# Patient Record
Sex: Male | Born: 1955 | Race: Black or African American | Hispanic: No | Smoking: Former smoker
Health system: Southern US, Community
[De-identification: ages and names within clinical notes are randomized; demographics above are authoritative.]

## PROBLEM LIST (undated history)

## (undated) DIAGNOSIS — I110 Hypertensive heart disease with heart failure: Secondary | ICD-10-CM

## (undated) DIAGNOSIS — N186 End stage renal disease: Secondary | ICD-10-CM

## (undated) DIAGNOSIS — I739 Peripheral vascular disease, unspecified: Secondary | ICD-10-CM

## (undated) DIAGNOSIS — I2510 Atherosclerotic heart disease of native coronary artery without angina pectoris: Secondary | ICD-10-CM

## (undated) DIAGNOSIS — E119 Type 2 diabetes mellitus without complications: Secondary | ICD-10-CM

## (undated) DIAGNOSIS — R0600 Dyspnea, unspecified: Secondary | ICD-10-CM

## (undated) DIAGNOSIS — N289 Disorder of kidney and ureter, unspecified: Secondary | ICD-10-CM

## (undated) DIAGNOSIS — I255 Ischemic cardiomyopathy: Secondary | ICD-10-CM

## (undated) DIAGNOSIS — I272 Pulmonary hypertension, unspecified: Secondary | ICD-10-CM

## (undated) DIAGNOSIS — I10 Essential (primary) hypertension: Secondary | ICD-10-CM

## (undated) DIAGNOSIS — I493 Ventricular premature depolarization: Secondary | ICD-10-CM

## (undated) DIAGNOSIS — I351 Nonrheumatic aortic (valve) insufficiency: Secondary | ICD-10-CM

## (undated) DIAGNOSIS — I219 Acute myocardial infarction, unspecified: Secondary | ICD-10-CM

## (undated) DIAGNOSIS — M1990 Unspecified osteoarthritis, unspecified site: Secondary | ICD-10-CM

## (undated) DIAGNOSIS — I5042 Chronic combined systolic (congestive) and diastolic (congestive) heart failure: Secondary | ICD-10-CM

## (undated) DIAGNOSIS — K759 Inflammatory liver disease, unspecified: Secondary | ICD-10-CM

## (undated) HISTORY — PX: DIALYSIS/PERMA CATHETER INSERTION: CATH118288

## (undated) HISTORY — DX: Nonrheumatic aortic (valve) insufficiency: I35.1

## (undated) HISTORY — DX: Ventricular premature depolarization: I49.3

## (undated) HISTORY — DX: Atherosclerotic heart disease of native coronary artery without angina pectoris: I25.10

## (undated) HISTORY — DX: Ischemic cardiomyopathy: I25.5

---

## 2008-08-23 ENCOUNTER — Emergency Department: Payer: Self-pay | Admitting: Emergency Medicine

## 2011-12-12 ENCOUNTER — Inpatient Hospital Stay: Payer: Self-pay | Admitting: Internal Medicine

## 2011-12-12 LAB — COMPREHENSIVE METABOLIC PANEL
Anion Gap: 26 — ABNORMAL HIGH (ref 7–16)
BUN: 192 mg/dL — ABNORMAL HIGH (ref 7–18)
Calcium, Total: <5 mg/dL — CL (ref 8.5–10.1)
Creatinine: 24.84 mg/dL — ABNORMAL HIGH (ref 0.60–1.30)
EGFR (African American): 2 — ABNORMAL LOW
EGFR (Non-African Amer.): 2 — ABNORMAL LOW
Glucose: 77 mg/dL (ref 65–99)
Osmolality: 340 (ref 275–301)
SGOT(AST): 55 U/L — ABNORMAL HIGH (ref 15–37)
Sodium: 139 mmol/L (ref 136–145)
Total Protein: 8.2 g/dL (ref 6.4–8.2)

## 2011-12-12 LAB — CBC
HCT: 19.4 % — ABNORMAL LOW (ref 40.0–52.0)
HGB: 6.6 g/dL — ABNORMAL LOW (ref 13.0–18.0)
MCH: 28.4 pg (ref 26.0–34.0)
MCHC: 33.7 g/dL (ref 32.0–36.0)
MCV: 84 fL (ref 80–100)
RBC: 2.3 10*6/uL — ABNORMAL LOW (ref 4.40–5.90)
RDW: 13.8 % (ref 11.5–14.5)

## 2011-12-12 LAB — IRON AND TIBC
Iron Saturation: 28 %
Unbound Iron-Bind.Cap.: 214 ug/dL

## 2011-12-12 LAB — PRO B NATRIURETIC PEPTIDE: B-Type Natriuretic Peptide: 26932 pg/mL — ABNORMAL HIGH (ref 0–125)

## 2011-12-12 LAB — FERRITIN: Ferritin (ARMC): 150 ng/mL (ref 8–388)

## 2011-12-12 LAB — MAGNESIUM: Magnesium: 1 mg/dL — ABNORMAL LOW

## 2011-12-12 LAB — TROPONIN I: Troponin-I: 0.07 ng/mL — ABNORMAL HIGH

## 2011-12-12 LAB — PROTIME-INR: Prothrombin Time: 15.7 secs — ABNORMAL HIGH (ref 11.5–14.7)

## 2011-12-12 LAB — URIC ACID: Uric Acid: 8 mg/dL — ABNORMAL HIGH (ref 3.5–7.2)

## 2011-12-12 LAB — CK TOTAL AND CKMB (NOT AT ARMC): CK-MB: 49.1 ng/mL — ABNORMAL HIGH (ref 0.5–3.6)

## 2011-12-12 LAB — PHOSPHORUS: Phosphorus: 13.2 mg/dL — ABNORMAL HIGH (ref 2.5–4.9)

## 2011-12-12 LAB — PROTEIN / CREATININE RATIO, URINE: Protein/Creat. Ratio: 2589 mg/gCREAT — ABNORMAL HIGH (ref 0–200)

## 2011-12-13 DIAGNOSIS — R0602 Shortness of breath: Secondary | ICD-10-CM

## 2011-12-13 LAB — BASIC METABOLIC PANEL
Anion Gap: 25 — ABNORMAL HIGH (ref 7–16)
BUN: 172 mg/dL — ABNORMAL HIGH (ref 7–18)
Chloride: 102 mmol/L (ref 98–107)
Co2: 15 mmol/L — ABNORMAL LOW (ref 21–32)
Creatinine: 23.67 mg/dL — ABNORMAL HIGH (ref 0.60–1.30)
Creatinine: 19.24 mg/dL — ABNORMAL HIGH (ref 0.60–1.30)
EGFR (African American): 2 — ABNORMAL LOW
EGFR (African American): 3 — ABNORMAL LOW
EGFR (Non-African Amer.): 2 — ABNORMAL LOW
Glucose: 99 mg/dL (ref 65–99)
Potassium: 4.3 mmol/L (ref 3.5–5.1)
Potassium: 3.2 mmol/L — ABNORMAL LOW (ref 3.5–5.1)
Sodium: 137 mmol/L (ref 136–145)
Sodium: 137 mmol/L (ref 136–145)

## 2011-12-13 LAB — HEMOGLOBIN A1C: Hemoglobin A1C: 5.3 % (ref 4.2–6.3)

## 2011-12-13 LAB — LIPID PANEL
Cholesterol: 136 mg/dL (ref 0–200)
Ldl Cholesterol, Calc: 70 mg/dL (ref 0–100)
Triglycerides: 130 mg/dL (ref 0–200)
VLDL Cholesterol, Calc: 26 mg/dL (ref 5–40)

## 2011-12-13 LAB — CBC WITH DIFFERENTIAL/PLATELET
Basophil #: 0.1 10*3/uL (ref 0.0–0.1)
HCT: 21.6 % — ABNORMAL LOW (ref 40.0–52.0)
Lymphocyte #: 0.5 10*3/uL — ABNORMAL LOW (ref 1.0–3.6)
MCH: 28.4 pg (ref 26.0–34.0)
MCV: 86 fL (ref 80–100)
Monocyte %: 10.6 %
Neutrophil #: 6.4 10*3/uL (ref 1.4–6.5)
Platelet: 250 10*3/uL (ref 150–440)
RDW: 13.8 % (ref 11.5–14.5)
WBC: 8 10*3/uL (ref 3.8–10.6)

## 2011-12-13 LAB — CK TOTAL AND CKMB (NOT AT ARMC)
CK, Total: 3525 U/L — ABNORMAL HIGH (ref 35–232)
CK-MB: 35.3 ng/mL — ABNORMAL HIGH (ref 0.5–3.6)

## 2011-12-13 LAB — MAGNESIUM: Magnesium: 2.4 mg/dL

## 2011-12-13 LAB — TROPONIN I: Troponin-I: 0.08 ng/mL — ABNORMAL HIGH

## 2011-12-14 LAB — CBC WITH DIFFERENTIAL/PLATELET
Basophil #: 0 10*3/uL (ref 0.0–0.1)
Basophil %: 0.5 %
Eosinophil #: 0.1 10*3/uL (ref 0.0–0.7)
Eosinophil %: 1.1 %
HCT: 18.8 % — ABNORMAL LOW (ref 40.0–52.0)
HGB: 6.3 g/dL — ABNORMAL LOW (ref 13.0–18.0)
Lymphocyte #: 0.6 10*3/uL — ABNORMAL LOW (ref 1.0–3.6)
MCH: 28.2 pg (ref 26.0–34.0)
MCHC: 33.4 g/dL (ref 32.0–36.0)
MCV: 84 fL (ref 80–100)
Neutrophil #: 5.6 10*3/uL (ref 1.4–6.5)
Neutrophil %: 76.8 %
RDW: 13.6 % (ref 11.5–14.5)

## 2011-12-14 LAB — RENAL FUNCTION PANEL
Anion Gap: 20 — ABNORMAL HIGH (ref 7–16)
BUN: 144 mg/dL — ABNORMAL HIGH (ref 7–18)
Calcium, Total: 5.5 mg/dL — CL (ref 8.5–10.1)
Chloride: 104 mmol/L (ref 98–107)
Creatinine: 20.09 mg/dL — ABNORMAL HIGH (ref 0.60–1.30)
EGFR (African American): 3 — ABNORMAL LOW
Glucose: 146 mg/dL — ABNORMAL HIGH (ref 65–99)
Phosphorus: >18 mg/dL — ABNORMAL HIGH (ref 2.5–4.9)
Potassium: 3.2 mmol/L — ABNORMAL LOW (ref 3.5–5.1)
Sodium: 138 mmol/L (ref 136–145)

## 2011-12-14 LAB — PROTEIN ELECTROPHORESIS(ARMC)

## 2011-12-15 LAB — RENAL FUNCTION PANEL
Albumin: 2.8 g/dL — ABNORMAL LOW (ref 3.4–5.0)
Anion Gap: 18 — ABNORMAL HIGH (ref 7–16)
BUN: 103 mg/dL — ABNORMAL HIGH (ref 7–18)
Calcium, Total: 6.1 mg/dL — CL (ref 8.5–10.1)
Chloride: 101 mmol/L (ref 98–107)
Co2: 19 mmol/L — ABNORMAL LOW (ref 21–32)
Creatinine: 16.05 mg/dL — ABNORMAL HIGH (ref 0.60–1.30)
EGFR (Non-African Amer.): 3 — ABNORMAL LOW
Phosphorus: 8.7 mg/dL — ABNORMAL HIGH (ref 2.5–4.9)

## 2011-12-16 LAB — OCCULT BLOOD X 1 CARD TO LAB, STOOL: Occult Blood, Feces: NEGATIVE

## 2011-12-17 LAB — RENAL FUNCTION PANEL
Anion Gap: 16 (ref 7–16)
BUN: 73 mg/dL — ABNORMAL HIGH (ref 7–18)
Chloride: 98 mmol/L (ref 98–107)
Co2: 23 mmol/L (ref 21–32)
Creatinine: 12.96 mg/dL — ABNORMAL HIGH (ref 0.60–1.30)
Osmolality: 297 (ref 275–301)
Phosphorus: 7.5 mg/dL — ABNORMAL HIGH (ref 2.5–4.9)
Potassium: 3.6 mmol/L (ref 3.5–5.1)
Sodium: 137 mmol/L (ref 136–145)

## 2011-12-17 LAB — CBC WITH DIFFERENTIAL/PLATELET
Basophil #: 0.1 10*3/uL (ref 0.0–0.1)
Eosinophil %: 0.7 %
HCT: 23.5 % — ABNORMAL LOW (ref 40.0–52.0)
HGB: 7.7 g/dL — ABNORMAL LOW (ref 13.0–18.0)
Lymphocyte #: 0.7 10*3/uL — ABNORMAL LOW (ref 1.0–3.6)
Lymphocyte %: 8.6 %
MCV: 86 fL (ref 80–100)
Monocyte #: 1.1 x10 3/mm — ABNORMAL HIGH (ref 0.2–1.0)
Monocyte %: 13 %
Neutrophil #: 6.2 10*3/uL (ref 1.4–6.5)
Neutrophil %: 77 %
Platelet: 258 10*3/uL (ref 150–440)
RBC: 2.74 10*6/uL — ABNORMAL LOW (ref 4.40–5.90)
RDW: 13.6 % (ref 11.5–14.5)
WBC: 8.1 10*3/uL (ref 3.8–10.6)

## 2012-01-18 ENCOUNTER — Emergency Department: Payer: Self-pay | Admitting: Emergency Medicine

## 2012-01-18 LAB — CBC WITH DIFFERENTIAL/PLATELET
Basophil %: 0.7 %
Eosinophil %: 3.5 %
HCT: 31.4 % — ABNORMAL LOW (ref 40.0–52.0)
HGB: 10 g/dL — ABNORMAL LOW (ref 13.0–18.0)
Lymphocyte %: 27.4 %
MCV: 89 fL (ref 80–100)
Monocyte #: 0.9 x10 3/mm (ref 0.2–1.0)
Monocyte %: 16.9 %
Neutrophil #: 2.8 10*3/uL (ref 1.4–6.5)
RBC: 3.52 10*6/uL — ABNORMAL LOW (ref 4.40–5.90)
WBC: 5.3 10*3/uL (ref 3.8–10.6)

## 2012-01-18 LAB — BASIC METABOLIC PANEL
Anion Gap: 10 (ref 7–16)
BUN: 49 mg/dL — ABNORMAL HIGH (ref 7–18)
Co2: 22 mmol/L (ref 21–32)
Creatinine: 8.39 mg/dL — ABNORMAL HIGH (ref 0.60–1.30)
EGFR (Non-African Amer.): 6 — ABNORMAL LOW
Glucose: 81 mg/dL (ref 65–99)
Sodium: 139 mmol/L (ref 136–145)

## 2012-01-21 ENCOUNTER — Inpatient Hospital Stay: Payer: Self-pay | Admitting: Specialist

## 2012-01-21 LAB — URINALYSIS, COMPLETE
Bacteria: NONE SEEN
Bilirubin,UR: NEGATIVE
Glucose,UR: NEGATIVE mg/dL (ref 0–75)
Leukocyte Esterase: NEGATIVE
Nitrite: NEGATIVE
Ph: 5 (ref 4.5–8.0)
Squamous Epithelial: <1
WBC UR: 2 /HPF (ref 0–5)

## 2012-01-21 LAB — TROPONIN I: Troponin-I: <0.02 ng/mL

## 2012-01-21 LAB — COMPREHENSIVE METABOLIC PANEL
Alkaline Phosphatase: 157 U/L — ABNORMAL HIGH (ref 50–136)
BUN: 32 mg/dL — ABNORMAL HIGH (ref 7–18)
Bilirubin,Total: 0.4 mg/dL (ref 0.2–1.0)
Co2: 22 mmol/L (ref 21–32)
Creatinine: 5.26 mg/dL — ABNORMAL HIGH (ref 0.60–1.30)
EGFR (Non-African Amer.): 11 — ABNORMAL LOW
Glucose: 66 mg/dL (ref 65–99)
Potassium: 3.3 mmol/L — ABNORMAL LOW (ref 3.5–5.1)
SGOT(AST): 44 U/L — ABNORMAL HIGH (ref 15–37)
SGPT (ALT): 32 U/L (ref 12–78)
Sodium: 136 mmol/L (ref 136–145)
Total Protein: 7.6 g/dL (ref 6.4–8.2)

## 2012-01-21 LAB — CBC
HGB: 10.5 g/dL — ABNORMAL LOW (ref 13.0–18.0)
MCH: 29.2 pg (ref 26.0–34.0)
MCHC: 33.1 g/dL (ref 32.0–36.0)
MCV: 88 fL (ref 80–100)
Platelet: 233 10*3/uL (ref 150–440)
RBC: 3.59 10*6/uL — ABNORMAL LOW (ref 4.40–5.90)
RDW: 16.6 % — ABNORMAL HIGH (ref 11.5–14.5)

## 2012-01-22 LAB — CBC WITH DIFFERENTIAL/PLATELET
Basophil #: 0 10*3/uL (ref 0.0–0.1)
Eosinophil #: 0 10*3/uL (ref 0.0–0.7)
Eosinophil %: 0.3 %
Lymphocyte #: 0.6 10*3/uL — ABNORMAL LOW (ref 1.0–3.6)
MCV: 89 fL (ref 80–100)
Monocyte %: 9.5 %
Neutrophil %: 85.2 %
Platelet: 211 10*3/uL (ref 150–440)
RBC: 3.15 10*6/uL — ABNORMAL LOW (ref 4.40–5.90)
RDW: 16.5 % — ABNORMAL HIGH (ref 11.5–14.5)
WBC: 11.8 10*3/uL — ABNORMAL HIGH (ref 3.8–10.6)

## 2012-01-22 LAB — BASIC METABOLIC PANEL
Anion Gap: 11 (ref 7–16)
Calcium, Total: 7.3 mg/dL — ABNORMAL LOW (ref 8.5–10.1)
Chloride: 106 mmol/L (ref 98–107)
Co2: 19 mmol/L — ABNORMAL LOW (ref 21–32)
Creatinine: 6.47 mg/dL — ABNORMAL HIGH (ref 0.60–1.30)
EGFR (Non-African Amer.): 9 — ABNORMAL LOW
Osmolality: 281 (ref 275–301)

## 2012-01-24 LAB — CBC WITH DIFFERENTIAL/PLATELET
Basophil #: 0.1 10*3/uL (ref 0.0–0.1)
Basophil %: 2 %
Eosinophil #: 0.2 10*3/uL (ref 0.0–0.7)
Eosinophil %: 2.4 %
Eosinophil %: 2.7 %
HCT: 29.8 % — ABNORMAL LOW (ref 40.0–52.0)
HGB: 9.3 g/dL — ABNORMAL LOW (ref 13.0–18.0)
Lymphocyte #: 1.9 10*3/uL (ref 1.0–3.6)
Lymphocyte #: 1.5 10*3/uL (ref 1.0–3.6)
MCH: 27.5 pg (ref 26.0–34.0)
MCHC: 31.2 g/dL — ABNORMAL LOW (ref 32.0–36.0)
MCHC: 31.1 g/dL — ABNORMAL LOW (ref 32.0–36.0)
MCV: 88 fL (ref 80–100)
Monocyte #: 1.2 x10 3/mm — ABNORMAL HIGH (ref 0.2–1.0)
Monocyte #: 0.5 x10 3/mm (ref 0.2–1.0)
Monocyte %: 12.7 %
Neutrophil #: 6 10*3/uL (ref 1.4–6.5)
Neutrophil #: 3.6 10*3/uL (ref 1.4–6.5)
Neutrophil %: 63.7 %
Neutrophil %: 60.5 %
Platelet: 268 10*3/uL (ref 150–440)
Platelet: 257 10*3/uL (ref 150–440)
RBC: 3.39 10*6/uL — ABNORMAL LOW (ref 4.40–5.90)
RBC: 3.37 10*6/uL — ABNORMAL LOW (ref 4.40–5.90)
RDW: 17.1 % — ABNORMAL HIGH (ref 11.5–14.5)
WBC: 9.4 10*3/uL (ref 3.8–10.6)
WBC: 6 10*3/uL (ref 3.8–10.6)

## 2012-01-24 LAB — CULTURE, BLOOD (SINGLE)

## 2012-01-24 LAB — BASIC METABOLIC PANEL
BUN: 56 mg/dL — ABNORMAL HIGH (ref 7–18)
Calcium, Total: 7.4 mg/dL — ABNORMAL LOW (ref 8.5–10.1)
Chloride: 110 mmol/L — ABNORMAL HIGH (ref 98–107)
EGFR (African American): 7 — ABNORMAL LOW
EGFR (Non-African Amer.): 6 — ABNORMAL LOW

## 2012-01-24 LAB — RENAL FUNCTION PANEL
Albumin: 2.7 g/dL — ABNORMAL LOW (ref 3.4–5.0)
Anion Gap: 8 (ref 7–16)
Calcium, Total: 7.9 mg/dL — ABNORMAL LOW (ref 8.5–10.1)
Glucose: 106 mg/dL — ABNORMAL HIGH (ref 65–99)
Osmolality: 292 (ref 275–301)
Phosphorus: 4.3 mg/dL (ref 2.5–4.9)

## 2012-01-26 LAB — RENAL FUNCTION PANEL
Anion Gap: 6 — ABNORMAL LOW (ref 7–16)
BUN: 40 mg/dL — ABNORMAL HIGH (ref 7–18)
Calcium, Total: 8.2 mg/dL — ABNORMAL LOW (ref 8.5–10.1)
Co2: 27 mmol/L (ref 21–32)
Creatinine: 7.94 mg/dL — ABNORMAL HIGH (ref 0.60–1.30)
EGFR (African American): 8 — ABNORMAL LOW
EGFR (Non-African Amer.): 7 — ABNORMAL LOW
Glucose: 75 mg/dL (ref 65–99)
Osmolality: 286 (ref 275–301)
Potassium: 5 mmol/L (ref 3.5–5.1)
Sodium: 139 mmol/L (ref 136–145)

## 2012-01-26 LAB — CBC WITH DIFFERENTIAL/PLATELET
Basophil #: 0.1 10*3/uL (ref 0.0–0.1)
Eosinophil %: 1.8 %
HCT: 30.7 % — ABNORMAL LOW (ref 40.0–52.0)
HGB: 9.6 g/dL — ABNORMAL LOW (ref 13.0–18.0)
Lymphocyte #: 1.8 10*3/uL (ref 1.0–3.6)
Lymphocyte %: 21.9 %
MCH: 27.5 pg (ref 26.0–34.0)
MCV: 88 fL (ref 80–100)
Monocyte %: 13 %
Neutrophil #: 5 10*3/uL (ref 1.4–6.5)
Neutrophil %: 62.2 %
Platelet: 258 10*3/uL (ref 150–440)
RBC: 3.48 10*6/uL — ABNORMAL LOW (ref 4.40–5.90)
RDW: 16.5 % — ABNORMAL HIGH (ref 11.5–14.5)
WBC: 8.1 10*3/uL (ref 3.8–10.6)

## 2012-01-27 DIAGNOSIS — I059 Rheumatic mitral valve disease, unspecified: Secondary | ICD-10-CM

## 2012-01-27 LAB — CULTURE, BLOOD (SINGLE)

## 2012-01-28 LAB — BASIC METABOLIC PANEL
Anion Gap: 8 (ref 7–16)
BUN: 37 mg/dL — ABNORMAL HIGH (ref 7–18)
Chloride: 105 mmol/L (ref 98–107)
Co2: 25 mmol/L (ref 21–32)
Creatinine: 7.97 mg/dL — ABNORMAL HIGH (ref 0.60–1.30)
EGFR (African American): 8 — ABNORMAL LOW
Sodium: 138 mmol/L (ref 136–145)

## 2012-01-29 LAB — PHOSPHORUS: Phosphorus: 8.2 mg/dL — ABNORMAL HIGH

## 2012-02-23 HISTORY — PX: AV FISTULA PLACEMENT: SHX1204

## 2012-07-06 ENCOUNTER — Ambulatory Visit: Payer: Self-pay | Admitting: Vascular Surgery

## 2012-07-19 ENCOUNTER — Emergency Department: Payer: Self-pay | Admitting: Emergency Medicine

## 2012-07-19 LAB — COMPREHENSIVE METABOLIC PANEL
Albumin: 3.3 g/dL — ABNORMAL LOW (ref 3.4–5.0)
Alkaline Phosphatase: 123 U/L (ref 50–136)
Anion Gap: 8 (ref 7–16)
BUN: 36 mg/dL — ABNORMAL HIGH (ref 7–18)
Co2: 25 mmol/L (ref 21–32)
Creatinine: 6.29 mg/dL — ABNORMAL HIGH (ref 0.60–1.30)
EGFR (Non-African Amer.): 9 — ABNORMAL LOW
Glucose: 71 mg/dL (ref 65–99)
Osmolality: 281 (ref 275–301)
SGOT(AST): 36 U/L (ref 15–37)
Total Protein: 7.6 g/dL (ref 6.4–8.2)

## 2012-07-19 LAB — TROPONIN I
Troponin-I: <0.02 ng/mL
Troponin-I: <0.02 ng/mL

## 2012-07-19 LAB — PROTIME-INR
INR: 1
Prothrombin Time: 12.9 secs (ref 11.5–14.7)

## 2012-07-19 LAB — CBC
Platelet: 217 10*3/uL (ref 150–440)
RBC: 3.74 10*6/uL — ABNORMAL LOW (ref 4.40–5.90)

## 2012-08-01 ENCOUNTER — Ambulatory Visit: Payer: Self-pay | Admitting: Vascular Surgery

## 2012-11-05 ENCOUNTER — Inpatient Hospital Stay: Payer: Self-pay | Admitting: Student

## 2012-11-05 LAB — URINALYSIS, COMPLETE
Bilirubin,UR: NEGATIVE
Ketone: NEGATIVE
Leukocyte Esterase: NEGATIVE
Nitrite: NEGATIVE
Ph: 7 (ref 4.5–8.0)
Protein: 25
RBC,UR: 1 /HPF (ref 0–5)
Specific Gravity: 1.005 (ref 1.003–1.030)
WBC UR: <1 /HPF (ref 0–5)

## 2012-11-05 LAB — COMPREHENSIVE METABOLIC PANEL
Anion Gap: 10 (ref 7–16)
BUN: 133 mg/dL — ABNORMAL HIGH (ref 7–18)
Bilirubin,Total: 0.5 mg/dL (ref 0.2–1.0)
Calcium, Total: 6.9 mg/dL — CL (ref 8.5–10.1)
Creatinine: 11.02 mg/dL — ABNORMAL HIGH (ref 0.60–1.30)
EGFR (African American): 5 — ABNORMAL LOW
SGOT(AST): 34 U/L (ref 15–37)
Sodium: 141 mmol/L (ref 136–145)

## 2012-11-05 LAB — CBC
HCT: 27.5 % — ABNORMAL LOW (ref 40.0–52.0)
HGB: 8.9 g/dL — ABNORMAL LOW (ref 13.0–18.0)
MCH: 30.1 pg (ref 26.0–34.0)
MCHC: 32.2 g/dL (ref 32.0–36.0)
Platelet: 194 10*3/uL (ref 150–440)

## 2012-11-05 LAB — POTASSIUM: Potassium: 6.6 mmol/L (ref 3.5–5.1)

## 2012-11-05 LAB — TROPONIN I: Troponin-I: <0.02 ng/mL

## 2012-11-06 DIAGNOSIS — I359 Nonrheumatic aortic valve disorder, unspecified: Secondary | ICD-10-CM

## 2012-11-06 LAB — CLOSTRIDIUM DIFFICILE BY PCR

## 2012-11-06 LAB — BASIC METABOLIC PANEL
Anion Gap: 13 (ref 7–16)
BUN: 60 mg/dL — ABNORMAL HIGH (ref 7–18)
Calcium, Total: 7.9 mg/dL — ABNORMAL LOW (ref 8.5–10.1)
Chloride: 102 mmol/L (ref 98–107)
Co2: 25 mmol/L (ref 21–32)
Glucose: 66 mg/dL (ref 65–99)
Osmolality: 294 (ref 275–301)
Potassium: 3.7 mmol/L (ref 3.5–5.1)

## 2012-11-06 LAB — TROPONIN I
Troponin-I: 0.09 ng/mL — ABNORMAL HIGH
Troponin-I: 0.14 ng/mL — ABNORMAL HIGH

## 2012-11-06 LAB — PHOSPHORUS: Phosphorus: 6.2 mg/dL — ABNORMAL HIGH (ref 2.5–4.9)

## 2012-11-06 LAB — CK TOTAL AND CKMB (NOT AT ARMC)
CK, Total: 235 U/L — ABNORMAL HIGH (ref 35–232)
CK-MB: 4.2 ng/mL — ABNORMAL HIGH (ref 0.5–3.6)
CK-MB: 3.3 ng/mL (ref 0.5–3.6)

## 2012-11-07 LAB — LIPID PANEL
Cholesterol: 101 mg/dL (ref 0–200)
Ldl Cholesterol, Calc: 38 mg/dL (ref 0–100)
Triglycerides: 119 mg/dL (ref 0–200)
VLDL Cholesterol, Calc: 24 mg/dL (ref 5–40)

## 2012-11-07 LAB — PLATELET COUNT: Platelet: 155 10*3/uL (ref 150–440)

## 2012-11-07 LAB — BASIC METABOLIC PANEL
BUN: 47 mg/dL — ABNORMAL HIGH (ref 7–18)
Calcium, Total: 7.3 mg/dL — ABNORMAL LOW (ref 8.5–10.1)
Chloride: 101 mmol/L (ref 98–107)
Co2: 30 mmol/L (ref 21–32)
EGFR (Non-African Amer.): 9 — ABNORMAL LOW
Glucose: 86 mg/dL (ref 65–99)

## 2012-11-07 LAB — WBCS, STOOL

## 2012-11-10 LAB — CBC WITH DIFFERENTIAL/PLATELET
Basophil #: 0 10*3/uL (ref 0.0–0.1)
Basophil %: 0.7 %
Eosinophil #: 0.2 10*3/uL (ref 0.0–0.7)
Eosinophil %: 2.6 %
HGB: 6.4 g/dL — ABNORMAL LOW (ref 13.0–18.0)
Lymphocyte %: 22.3 %
MCV: 93 fL (ref 80–100)
Monocyte #: 0.6 x10 3/mm (ref 0.2–1.0)
Platelet: 229 10*3/uL (ref 150–440)
RBC: 2.08 10*6/uL — ABNORMAL LOW (ref 4.40–5.90)
RDW: 14 % (ref 11.5–14.5)

## 2012-11-10 LAB — COMPREHENSIVE METABOLIC PANEL
Bilirubin,Total: 0.2 mg/dL (ref 0.2–1.0)
Calcium, Total: 8 mg/dL — ABNORMAL LOW (ref 8.5–10.1)
Co2: 26 mmol/L (ref 21–32)
Creatinine: 5.25 mg/dL — ABNORMAL HIGH (ref 0.60–1.30)
EGFR (African American): 13 — ABNORMAL LOW
Glucose: 205 mg/dL — ABNORMAL HIGH (ref 65–99)
Osmolality: 288 (ref 275–301)
Potassium: 3.7 mmol/L (ref 3.5–5.1)
Total Protein: 6.6 g/dL (ref 6.4–8.2)

## 2012-11-10 LAB — IRON AND TIBC
Iron Bind.Cap.(Total): 277 ug/dL (ref 250–450)
Iron Saturation: 25 %
Unbound Iron-Bind.Cap.: 209 ug/dL

## 2012-11-10 LAB — HEMOGLOBIN: HGB: 7 g/dL — ABNORMAL LOW (ref 13.0–18.0)

## 2012-11-11 LAB — HEMOGLOBIN: HGB: 7.1 g/dL — ABNORMAL LOW (ref 13.0–18.0)

## 2012-11-12 ENCOUNTER — Inpatient Hospital Stay: Payer: Self-pay | Admitting: Internal Medicine

## 2012-11-12 LAB — CBC WITH DIFFERENTIAL/PLATELET
Basophil %: 0.3 %
Eosinophil #: 0.1 10*3/uL (ref 0.0–0.7)
HGB: 7 g/dL — ABNORMAL LOW (ref 13.0–18.0)
Lymphocyte #: 1.3 10*3/uL (ref 1.0–3.6)
Lymphocyte %: 20.9 %
MCHC: 33.1 g/dL (ref 32.0–36.0)
MCV: 92 fL (ref 80–100)
Monocyte #: 0.8 x10 3/mm (ref 0.2–1.0)
Monocyte %: 12.2 %
Neutrophil #: 4.1 10*3/uL (ref 1.4–6.5)
Platelet: 230 10*3/uL (ref 150–440)
RBC: 2.29 10*6/uL — ABNORMAL LOW (ref 4.40–5.90)
RDW: 14.6 % — ABNORMAL HIGH (ref 11.5–14.5)
WBC: 6.4 10*3/uL (ref 3.8–10.6)

## 2012-11-13 LAB — CBC WITH DIFFERENTIAL/PLATELET
Basophil #: 0 10*3/uL (ref 0.0–0.1)
Basophil %: 0.4 %
Eosinophil %: 2.5 %
HCT: 22.2 % — ABNORMAL LOW (ref 40.0–52.0)
HGB: 7.3 g/dL — ABNORMAL LOW (ref 13.0–18.0)
Lymphocyte #: 1.4 10*3/uL (ref 1.0–3.6)
Lymphocyte %: 21.5 %
MCH: 30.6 pg (ref 26.0–34.0)
MCHC: 33.1 g/dL (ref 32.0–36.0)
MCV: 93 fL (ref 80–100)
Monocyte #: 0.8 x10 3/mm (ref 0.2–1.0)
Monocyte %: 12.1 %
Neutrophil %: 63.5 %
Platelet: 241 10*3/uL (ref 150–440)
WBC: 6.5 10*3/uL (ref 3.8–10.6)

## 2013-04-26 ENCOUNTER — Inpatient Hospital Stay: Payer: Self-pay | Admitting: Internal Medicine

## 2013-04-26 LAB — CBC
HCT: 34.4 % — AB (ref 40.0–52.0)
HGB: 11.1 g/dL — AB (ref 13.0–18.0)
MCH: 30.3 pg (ref 26.0–34.0)
MCHC: 32.3 g/dL (ref 32.0–36.0)
MCV: 94 fL (ref 80–100)
PLATELETS: 191 10*3/uL (ref 150–440)
RBC: 3.67 10*6/uL — AB (ref 4.40–5.90)
RDW: 16.4 % — ABNORMAL HIGH (ref 11.5–14.5)
WBC: 9.7 10*3/uL (ref 3.8–10.6)

## 2013-04-26 LAB — COMPREHENSIVE METABOLIC PANEL
ALT: 50 U/L (ref 12–78)
ANION GAP: 13 (ref 7–16)
Albumin: 3.2 g/dL — ABNORMAL LOW (ref 3.4–5.0)
Alkaline Phosphatase: 68 U/L
BUN: 124 mg/dL — ABNORMAL HIGH (ref 7–18)
Bilirubin,Total: 0.4 mg/dL (ref 0.2–1.0)
CHLORIDE: 104 mmol/L (ref 98–107)
Calcium, Total: 9.2 mg/dL (ref 8.5–10.1)
Co2: 20 mmol/L — ABNORMAL LOW (ref 21–32)
Creatinine: 8.74 mg/dL — ABNORMAL HIGH (ref 0.60–1.30)
EGFR (African American): 7 — ABNORMAL LOW
GFR CALC NON AF AMER: 6 — AB
Glucose: 80 mg/dL (ref 65–99)
Osmolality: 313 (ref 275–301)
Potassium: 5.7 mmol/L — ABNORMAL HIGH (ref 3.5–5.1)
SGOT(AST): 56 U/L — ABNORMAL HIGH (ref 15–37)
Sodium: 137 mmol/L (ref 136–145)
Total Protein: 7.2 g/dL (ref 6.4–8.2)

## 2013-04-26 LAB — POTASSIUM: Potassium: 4.1 mmol/L (ref 3.5–5.1)

## 2013-04-26 LAB — PHOSPHORUS: Phosphorus: 5.2 mg/dL — ABNORMAL HIGH (ref 2.5–4.9)

## 2013-04-26 LAB — HEMOGLOBIN: HGB: 8.5 g/dL — AB (ref 13.0–18.0)

## 2013-04-26 LAB — CK TOTAL AND CKMB (NOT AT ARMC)
CK, Total: 171 U/L
CK-MB: 3 ng/mL (ref 0.5–3.6)

## 2013-04-26 LAB — PROTIME-INR
INR: 1.1
Prothrombin Time: 14 secs (ref 11.5–14.7)

## 2013-04-26 LAB — SALICYLATE LEVEL: Salicylates, Serum: <1.7 mg/dL

## 2013-04-26 LAB — ETHANOL
Ethanol %: <0.003 % (ref 0.000–0.080)
Ethanol: <3 mg/dL

## 2013-04-27 ENCOUNTER — Ambulatory Visit: Payer: Self-pay | Admitting: Neurology

## 2013-04-27 LAB — DRUG SCREEN, URINE
Amphetamines, Ur Screen: NEGATIVE (ref ?–1000)
BARBITURATES, UR SCREEN: NEGATIVE (ref ?–200)
BENZODIAZEPINE, UR SCRN: NEGATIVE (ref ?–200)
COCAINE METABOLITE, UR ~~LOC~~: POSITIVE (ref ?–300)
Cannabinoid 50 Ng, Ur ~~LOC~~: NEGATIVE (ref ?–50)
MDMA (Ecstasy)Ur Screen: NEGATIVE (ref ?–500)
Methadone, Ur Screen: NEGATIVE (ref ?–300)
OPIATE, UR SCREEN: NEGATIVE (ref ?–300)
Phencyclidine (PCP) Ur S: NEGATIVE (ref ?–25)
TRICYCLIC, UR SCREEN: NEGATIVE (ref ?–1000)

## 2013-04-27 LAB — CBC WITH DIFFERENTIAL/PLATELET
Basophil #: 0 10*3/uL (ref 0.0–0.1)
Basophil #: 0.1 10*3/uL (ref 0.0–0.1)
Basophil %: 0.5 %
Basophil %: 1 %
EOS ABS: 0.1 10*3/uL (ref 0.0–0.7)
Eosinophil #: 0.1 10*3/uL (ref 0.0–0.7)
Eosinophil %: 0.8 %
Eosinophil %: 1.3 %
HCT: 27.6 % — AB (ref 40.0–52.0)
HCT: 26.1 % — ABNORMAL LOW (ref 40.0–52.0)
HGB: 9.5 g/dL — AB (ref 13.0–18.0)
HGB: 9 g/dL — AB (ref 13.0–18.0)
Lymphocyte #: 1.8 10*3/uL (ref 1.0–3.6)
Lymphocyte #: 2.2 10*3/uL (ref 1.0–3.6)
Lymphocyte %: 21.3 %
Lymphocyte %: 20.8 %
MCH: 31.7 pg (ref 26.0–34.0)
MCH: 31.5 pg (ref 26.0–34.0)
MCHC: 34.4 g/dL (ref 32.0–36.0)
MCHC: 34.3 g/dL (ref 32.0–36.0)
MCV: 92 fL (ref 80–100)
MCV: 92 fL (ref 80–100)
MONO ABS: 0.9 x10 3/mm (ref 0.2–1.0)
Monocyte #: 0.7 x10 3/mm (ref 0.2–1.0)
Monocyte %: 8.6 %
Monocyte %: 8.3 %
NEUTROS ABS: 6 10*3/uL (ref 1.4–6.5)
NEUTROS PCT: 68.6 %
Neutrophil #: 7.1 10*3/uL — ABNORMAL HIGH (ref 1.4–6.5)
Neutrophil %: 68.8 %
PLATELETS: 170 10*3/uL (ref 150–440)
Platelet: 164 10*3/uL (ref 150–440)
RBC: 2.99 10*6/uL — AB (ref 4.40–5.90)
RBC: 2.85 10*6/uL — AB (ref 4.40–5.90)
RDW: 15.6 % — AB (ref 11.5–14.5)
RDW: 15.8 % — AB (ref 11.5–14.5)
WBC: 8.7 10*3/uL (ref 3.8–10.6)
WBC: 10.4 10*3/uL (ref 3.8–10.6)

## 2013-04-27 LAB — TSH: THYROID STIMULATING HORM: 0.622 u[IU]/mL

## 2013-04-27 LAB — BASIC METABOLIC PANEL
ANION GAP: 11 (ref 7–16)
BUN: 116 mg/dL — ABNORMAL HIGH (ref 7–18)
CALCIUM: 8.1 mg/dL — AB (ref 8.5–10.1)
CHLORIDE: 106 mmol/L (ref 98–107)
Co2: 23 mmol/L (ref 21–32)
Creatinine: 8.64 mg/dL — ABNORMAL HIGH (ref 0.60–1.30)
EGFR (African American): 7 — ABNORMAL LOW
EGFR (Non-African Amer.): 6 — ABNORMAL LOW
Glucose: 80 mg/dL (ref 65–99)
Osmolality: 315 (ref 275–301)
Potassium: 4.7 mmol/L (ref 3.5–5.1)
Sodium: 140 mmol/L (ref 136–145)

## 2013-04-28 LAB — RENAL FUNCTION PANEL
Albumin: 2.7 g/dL — ABNORMAL LOW (ref 3.4–5.0)
Anion Gap: 10 (ref 7–16)
BUN: 133 mg/dL — ABNORMAL HIGH (ref 7–18)
CHLORIDE: 108 mmol/L — AB (ref 98–107)
CREATININE: 11.2 mg/dL — AB (ref 0.60–1.30)
Calcium, Total: 7.7 mg/dL — ABNORMAL LOW (ref 8.5–10.1)
Co2: 20 mmol/L — ABNORMAL LOW (ref 21–32)
EGFR (Non-African Amer.): 4 — ABNORMAL LOW
GFR CALC AF AMER: 5 — AB
Glucose: 122 mg/dL — ABNORMAL HIGH (ref 65–99)
Osmolality: 320 (ref 275–301)
PHOSPHORUS: 5.1 mg/dL — AB (ref 2.5–4.9)
Potassium: 4.8 mmol/L (ref 3.5–5.1)
SODIUM: 138 mmol/L (ref 136–145)

## 2013-04-28 LAB — CBC WITH DIFFERENTIAL/PLATELET
BASOS ABS: 0 10*3/uL (ref 0.0–0.1)
Basophil %: 0.5 %
Eosinophil #: 0.2 10*3/uL (ref 0.0–0.7)
Eosinophil %: 2.8 %
HCT: 22.4 % — AB (ref 40.0–52.0)
HGB: 7.5 g/dL — ABNORMAL LOW (ref 13.0–18.0)
Lymphocyte #: 1.1 10*3/uL (ref 1.0–3.6)
Lymphocyte %: 16 %
MCH: 31.5 pg (ref 26.0–34.0)
MCHC: 33.5 g/dL (ref 32.0–36.0)
MCV: 94 fL (ref 80–100)
Monocyte #: 0.6 x10 3/mm (ref 0.2–1.0)
Monocyte %: 9 %
Neutrophil #: 4.7 10*3/uL (ref 1.4–6.5)
Neutrophil %: 71.7 %
Platelet: 143 10*3/uL — ABNORMAL LOW (ref 150–440)
RBC: 2.39 10*6/uL — ABNORMAL LOW (ref 4.40–5.90)
RDW: 15.8 % — AB (ref 11.5–14.5)
WBC: 6.5 10*3/uL (ref 3.8–10.6)

## 2013-04-28 LAB — HEMOGLOBIN
HGB: 7.9 g/dL — ABNORMAL LOW (ref 13.0–18.0)
HGB: 7.9 g/dL — ABNORMAL LOW (ref 13.0–18.0)

## 2013-04-29 LAB — HEMOGLOBIN: HGB: 7.4 g/dL — ABNORMAL LOW (ref 13.0–18.0)

## 2013-04-30 LAB — RENAL FUNCTION PANEL
ALBUMIN: 2.6 g/dL — AB (ref 3.4–5.0)
Anion Gap: 7 (ref 7–16)
BUN: 76 mg/dL — AB (ref 7–18)
CALCIUM: 8 mg/dL — AB (ref 8.5–10.1)
CO2: 25 mmol/L (ref 21–32)
Chloride: 107 mmol/L (ref 98–107)
Creatinine: 9.49 mg/dL — ABNORMAL HIGH (ref 0.60–1.30)
EGFR (African American): 6 — ABNORMAL LOW
EGFR (Non-African Amer.): 5 — ABNORMAL LOW
GLUCOSE: 104 mg/dL — AB (ref 65–99)
OSMOLALITY: 300 (ref 275–301)
PHOSPHORUS: 4.6 mg/dL (ref 2.5–4.9)
Potassium: 4.4 mmol/L (ref 3.5–5.1)
Sodium: 139 mmol/L (ref 136–145)

## 2013-04-30 LAB — CLOSTRIDIUM DIFFICILE(ARMC)

## 2013-04-30 LAB — HEMOGLOBIN: HGB: 7.2 g/dL — ABNORMAL LOW (ref 13.0–18.0)

## 2013-05-03 LAB — STOOL CULTURE

## 2013-09-04 ENCOUNTER — Emergency Department: Payer: Self-pay | Admitting: Emergency Medicine

## 2013-09-04 LAB — CBC
HCT: 31.2 % — AB (ref 40.0–52.0)
HGB: 9.8 g/dL — AB (ref 13.0–18.0)
MCH: 28.5 pg (ref 26.0–34.0)
MCHC: 31.5 g/dL — AB (ref 32.0–36.0)
MCV: 91 fL (ref 80–100)
Platelet: 203 10*3/uL (ref 150–440)
RBC: 3.44 10*6/uL — ABNORMAL LOW (ref 4.40–5.90)
RDW: 15.3 % — AB (ref 11.5–14.5)
WBC: 11.5 10*3/uL — AB (ref 3.8–10.6)

## 2014-06-11 NOTE — Consult Note (Signed)
PATIENT NAME:  [name], [name] MR#:  [id number] DATE OF BIRTH:  [birth year]  DATE OF CONSULTATION:  12/13/2011  REFERRING PHYSICIAN:   CONSULTING PHYSICIAN:  Algernon Huxley, MD  REASON FOR CONSULTATION: Dialysis catheter placement for renal failure.   HISTORY OF PRESENT ILLNESS: This is a 59 year old African American male who was admitted with severe renal failure. His BUN was greater than 170 and his creatinine was greater than 23. He is confused and just generally feeling poorly with no energy. No baseline renal function was known. He was very short of breath and dizzy. Apparently he has had diabetes for many years and taking Glucophage when he was in prison but could no longer take any medications after he got out of prison. He has a heavy tobacco and alcohol history, although apparently he quit alcohol cold [place] a few weeks ago. He needs a dialysis catheter at this time for urgent immediate dialysis.   PAST MEDICAL HISTORY: Diabetes, which he has not taking medicine for for over a decade. No other known problems, but he does not go to a doctor.  PAST SURGICAL HISTORY: None.  MEDICATIONS: He has been taking none.   ALLERGIES: Sulfa.   FAMILY HISTORY: Mother has diabetes. Sister has diabetes.   SOCIAL HISTORY: He smokes and has for about 30 years. Heavy alcohol use but apparently reported quitting cold [place] at the beginning of the month. He lives with his nephew. He is a widower.   REVIEW OF SYSTEMS: Review of systems is a bit fuzzy as he is a very poor historian. GENERAL: No fevers or chills. Positive for fatigue and weakness. EYES: No blurred or double vision. EARS: No tinnitus or ear pain. CARDIOVASCULAR: No chest pain or palpitations. RESPIRATORY: Positive for shortness of breath and wheezing. GASTROINTESTINAL: Positive for nausea and poor appetite. GENITOURINARY: No hematuria or dysuria. ENDOCRINE: No heat or cold intolerance. HEME: No anemia or easy bruising that he knew of, but he is  anemic on arrival. SKIN: Positive for vitiligo. MUSCULOSKELETAL: No joint pain or swelling. NEUROLOGIC: No transient ischemic attack, stroke or seizure. PSYCH: No anxiety or depression.   PHYSICAL EXAMINATION:   GENERAL: A well-developed, well-nourished [place] American male not in acute distress.   VITAL SIGNS: Temperature 97.5, pulse 65, and blood pressure 160/86.   HEAD/FACE: Normocephalic, atraumatic.   EYES: Sclerae anicteric. Conjunctivae are clear.   EARS: Normal external appearance. Hearing is intact.   NECK: Supple without adenopathy or jugular venous distention.   HEART: Regular rate and rhythm.   LUNGS: Diminished bilaterally.   ABDOMEN: Soft, nondistended, and nontender.   EXTREMITIES: Mild lower extremity edema.   SKIN: Changes of vitiligo, otherwise warm and dry.  NEURO: Poor historian. No focal deficits appreciated.   LABS: Sodium is 137, potassium 4.3, chloride 109, CO2 9, BUN IS 172 today which is down for 192 on admission yesterday, and his creatinine is 23.67 which is down from 24.8 yesterday on admission with some hydration. White blood cell count is 8, hemoglobin 7.2, and platelet count 250,000.       ASSESSMENT AND PLAN: This is a 59 year old African American male who needs a dialysis catheter. This will be placed at the bedside.  This is a level-3 consultation. ____________________________ Algernon Huxley, MD jsd:slb D: 12/13/2011 10:03:28 ET T: 12/13/2011 10:31:38 ET JOB#: [id number]  cc: Algernon Huxley, MD, <Dictator> Algernon Huxley MD ELECTRONICALLY SIGNED 12/13/2011 13:10

## 2014-06-11 NOTE — Op Note (Signed)
PATIENT NAME:  [name], [name] MR#:  [id number] DATE OF BIRTH:  Aug 03, 1955  DATE OF PROCEDURE:  12/16/2011  PREOPERATIVE DIAGNOSES:  1. End-stage renal disease.  2. Hypertension.  3. Diabetes.   POSTOPERATIVE DIAGNOSES:  1. End-stage renal disease.  2. Hypertension.  3. Diabetes.   PROCEDURES:  1. Ultrasound guidance for vascular access, right jugular vein.  2. Fluoroscopic guidance for placement of catheter.  3. Placement of a right internal jugular vein PermCath.   SURGEON: Algernon Huxley, MD  ANESTHESIA: Local with moderate conscious sedation.   ESTIMATED BLOOD LOSS: Approximately 25 mL.   FLUOROSCOPY TIME: Less than one minute.   CONTRAST USED: None.   INDICATION FOR PROCEDURE: 59 year old African American male who was admitted earlier this week with renal failure. He has had three dialysis treatments and yet his BUN is in excess of 100 and his creatinine is still over 15. He has been deemed end-stage renal disease and will require long-term dialysis.   DESCRIPTION OF PROCEDURE: Patient brought to the vascular interventional radiology suite. Right neck and chest sterilely prepped and draped, a sterile surgical field was created. The right jugular vein was visualized with ultrasound and found to be patent. It was then accessed with minimal difficulty with a Seldinger needle under direct ultrasound guidance and permanent image was recorded. J-wire was placed. After skin nick and dilatation, the peel-away sheath was placed over the wire. I then anesthetized an area below the right clavicle, tunneled from the subclavicular incision to the access site and I selected a 19 cm tip to cuff tunneled hemodialysis catheter using fluoroscopic guidance. This was then placed through the peel-away sheath. The peel-away sheath was removed. The catheter tips were located in the right atrium and then the appropriate distal connectors were placed. It withdrew blood well and flushed easily with heparinized  saline and the concentrated heparin solution was then placed. It was secured to the chest wall with two Prolene sutures. A 4-0 Monocryl pursestring suture was placed at the exit site and 4-0 Monocryl suture was used to close the access site. Sterile dressing was placed. The patient tolerated procedure well was taken to recovery room in stable condition.   ____________________________ Algernon Huxley, MD jsd:cms D: 12/16/2011 16:23:21 ET T: 12/16/2011 16:39:45 ET JOB#: [id number]  cc: Algernon Huxley, MD, <Dictator>  Algernon Huxley MD ELECTRONICALLY SIGNED 12/19/2011 9:16

## 2014-06-11 NOTE — Op Note (Signed)
PATIENT NAME:  [name], [name] MR#:  [id number] DATE OF BIRTH:  24-Feb-1955  DATE OF PROCEDURE:  12/13/2011  PREOPERATIVE DIAGNOSES:  1. Renal failure with unknown baseline renal functioning to whether this is acute renal failure or end-stage renal disease.  2. Uremia, severe.   POSTOPERATIVE DIAGNOSES: 1. Renal failure with unknown baseline renal functioning to whether this is acute renal failure or end-stage renal disease.  2. Uremia, severe.   PROCEDURES:  1. Ultrasound guidance for vascular access, right femoral vein.  2. Placement of right femoral Trialysis-type dialysis catheter, 30 cm in length.   SURGEON: Algernon Huxley, MD   ANESTHESIA: Local with lidocaine.   ESTIMATED BLOOD LOSS: Minimal.   INDICATION FOR PROCEDURE: This is a 59 year old African American male who was admitted with a BUN of greater than 170 and a creatinine of greater than 24. He had no baseline renal function known and needs dialysis urgently for his renal failure, severe uremia. His BNP is massively elevated consistent with volume overload from renal failure.   DESCRIPTION OF PROCEDURE: The patient was laid flat in his floor bed. His right groin were sterilely prepped and draped and a sterile surgical field was created. Femoral access was selected to save the jugulars for PermCath which would likely be necessary in the near future. The right femoral vein was visualized with ultrasound and found to be patent. It was accessed under direct ultrasound guidance without difficulty with a Seldinger needle. A J-wire was placed. After skin nick and dilatation, the 30 cm long Trialysis-type dialysis catheter was placed over the wire and the wire was removed.   All three lumens withdrew dark red nonpulsatile blood and flushed easily with sterile saline. It was secured at 29 cm with three nylon sutures. Sterile dressing was placed. The patient tolerated the procedure well.   ____________________________ Algernon Huxley,  MD jsd:drc D: 12/13/2011 10:04:39 ET T: 12/13/2011 10:26:11 ET JOB#: [id number]  cc: Algernon Huxley, MD, <Dictator> Algernon Huxley MD ELECTRONICALLY SIGNED 12/13/2011 13:10

## 2014-06-11 NOTE — Op Note (Signed)
PATIENT NAME:  DANIELJOSEPH, ULVEN MR#:  [id number] DATE OF BIRTH:  [birth year]  DATE OF PROCEDURE:  01/22/2012  PREOPERATIVE DIAGNOSES:  1. Bacteremia.  2. End-stage renal disease with PermCath present.    POSTOPERATIVE DIAGNOSES: 1. Bacteremia.  2. End-stage renal disease with PermCath present.    PROCEDURE: Removal of right jugular PermCath.   SURGEON: Algernon Huxley, MD   ANESTHESIA: Local.   ESTIMATED BLOOD LOSS: Minimal.   INDICATION FOR PROCEDURE: This is a 59 year old African American male with end-stage renal disease. He has gram-negative rod bacteremia and a PermCath in place. This will need to be removed.   DESCRIPTION OF PROCEDURE: The patient's existing catheter and right chest was sterilely prepped and draped and a sterile surgical field was created. The area was anesthetized copiously with 1% Xylocaine and then hemostats were used to dissect out and free the cuff from the fibrous sheath. The catheter was then removed in its entirety without difficulty with gentle traction. Pressure was held. Sterile dressing was placed. The catheter tip was cut and put in a specimen container for culture. The patient tolerated the procedure well.   ____________________________ Algernon Huxley, MD jsd:drc D: 01/22/2012 11:26:11 ET T: 01/22/2012 11:39:23 ET JOB#: [id number]  cc: Algernon Huxley, MD, <Dictator> Algernon Huxley MD ELECTRONICALLY SIGNED 01/24/2012 9:28

## 2014-06-11 NOTE — Consult Note (Signed)
Patient admitted with renal failure.  No known baseline renal function.  His BUN is 172 and his Cr is over 20.  Needs urgent HD.  Temp cath placed with his severe uremia.  If he is agreeable, will place Permcath later this week.  Electronic Signatures: Algernon Huxley (MD)  (Signed on 21-Oct-13 09:55)  Authored  Last Updated: 21-Oct-13 09:55 by Algernon Huxley (MD)

## 2014-06-11 NOTE — Consult Note (Signed)
PATIENT NAME:  [name], [name] MR#:  [id number] DATE OF BIRTH:  [birth year]  DATE OF CONSULTATION:  12/13/2011  REFERRING PHYSICIAN:  Dr. Karsten [name]  CONSULTING PHYSICIAN: Loistine Simas, MD/Tryniti Laatsch Orrin Brigham, NP  HISTORY OF PRESENT ILLNESS: [name] is a very pleasant 59 year old African American man with a history of alcohol abuse. GI has been consulted at the request of Dr. Karsten [name] for evaluation of anemia. Appreciate renal consult. Please see history and physical for details of admission. The patient was admitted with a hemoglobin of 6.6, creatinine greater than 24, and BUN greater than 100. Noted normal iron studies and normocytic red cells with normal RDW, normal platelet count. Noted elevated uric acid, CPK-MB, troponin levels. The patient reports onset of fatigue, shortness of breath with exertion, nausea and vomiting last week. He states that he vomits food stuffs randomly. No history of prior luminal evaluation. Denies abdominal pain, constipation, diarrhea, change to bowel habits, problems swallowing, acid reflux, and all other GI complaints. Noted that he has stool testing for occult blood, Hepatitis B and C tests, echocardiogram pending. Has been started on a PPI. Does also have some irregular loss of pigmentation to his hands and neck, onset 15 years ago. Has not been seen by dermatologist as of yet.   ALLERGIES: Sulfa medications.  MEDICATIONS: States no medications regularly.   PAST MEDICAL HISTORY: Questionable for diabetes. Denies other chronic or other type of illnesses and any surgery.   FAMILY HISTORY: Mom and sister with diabetes. No known other medical problems.   SOCIAL HISTORY: Does not smoke cigarettes, endorses a pack over three days for greater than 30 years. Has admitted consuming approximately a 12 pack a day of beer with intermittent moonshine use, however, states he has quit cold [place] over the last three weeks. Denies illicits. Widower and lives with nephew.  REVIEW  OF SYSTEMS: 10 systems reviewed, unremarkable other than what is noted above.   MOST RECENT LABS: Glucose 99. BNP [id number]. BUN 172, creatinine 23.67, potassium 4.3, GFR 2, calcium 5.1, phosphorus 13.4, magnesium 2.4, elevated uric acid, elevated CPK-MB and troponin x3. A1c 5.3. Total serum protein 8.2, albumin 3.4, bilirubin 0.4, alkaline phosphatase 99, AST 55, ALT 37. Iron 82, TIBC 296, UIBC 214, iron saturation 28, ferritin 150. WBC 8, hemoglobin 7.2, hematocrit 21.6, platelet count 250. Red cells are normocytic with normal distribution. PT 15.7. INR 1.2   PHYSICAL EXAMINATION:   MOST RECENT VITAL SIGNS: Temperature 97.5, pulse 58, respiratory rate 18, blood pressure 120/69, SAO2 99%. The patient is currently receiving hemodialysis.   GENERAL: Chronically ill but well appearing African American man lying in bed in no acute distress.   HEENT: Normocephalic, atraumatic. No redness, drainage, or inflammation to the eyes or nares. Oral mucous membranes are pink and moist.   NECK: No adenopathy, thyromegaly, or JVD.   RESPIRATORY: Respirations eupneic. Lungs CTAB.   CARDIAC: S1, S2. RRR. No MRG. Peripheral pulses 2+. No appreciable edema.   ABDOMEN: Flat, nondistended. Bowel sounds x4. Nontender. No guarding, rigidity, rebound, tenderness, peritoneal signs, hepatosplenomegaly, or hernias.   RECTAL: Deferred.   GU: Deferred.   EXTREMITIES: Generalized weakness. Does move all extremities spontaneously. Strength is approximately 4/5. Sensation appears to be intact.   SKIN: Warm, dry, pink. Does have pigment loss to both hands and some to his neck.   NEUROLOGIC: Alert, oriented x3. Speech clear. No facial droop. Cranial nerves II through XII intact.   PSYCH: Pleasant, cooperative. Responds to questions well.   IMPRESSION AND PLAN: Anemia,  likely multifactorial with large relation to his kidney disease. No signs of GI bleeding at present and has normal iron studies, folic acid normal, [id number]  pending. Agree with stool cards and PPI therapy. His nausea and vomiting also may be related to his uremia. Will treat symptomatically and may improve as his system is filtered with dialysis which he is currently receiving. Additionally, may benefit from epo level. Noted ANA and other autoimmune immune tests ordered. The patient additionally with extremely elevated BNP and elevation to cardiac enzymes; may benefit from cardiac evaluation. Will plan for luminal evaluation as his renal and cardiac status improve as he has had no evaluation of this type in the past and when he is feeling better this can be done as outpatient unless the patient shows signs of bleeding. Additionally, elevation in AST may be related to his alcohol consumption. Would follow for now. Consider abdominal ultrasound if it does not improve or increases.   We will follow with you. Thank you for this consult.   These services were provided by Stephens November, MSN, [place] in collaboration with Loistine Simas, MD with whom I have discussed this patient in full.   ____________________________ Theodore Demark, NP chl:drc D: 12/13/2011 16:28:23 ET T: 12/13/2011 16:45:09 ET JOB#: [id number]  cc: Theodore Demark, NP, <Dictator> [place] SIGNED 12/29/2011 8:19

## 2014-06-11 NOTE — Consult Note (Signed)
PATIENT NAME:  [name], [name] MR#:  [id number] DATE OF BIRTH:  [birth year]  DATE OF CONSULTATION:  01/24/2012  REFERRING PHYSICIAN:  Vaughan Basta, MD CONSULTING PHYSICIAN:  Heinz Knuckles. Remmie Bembenek, MD  REASON FOR CONSULTATION: Gram-negative rod bacteremia.   HISTORY OF PRESENT ILLNESS: The patient is a 59 year old man with a past history significant for hepatitis C infection and end-stage renal disease and on hemodialysis who was admitted on 01/21/2012 with approximately one week of fevers and chills at dialysis. The patient states that he had been feeling well until he began having fevers and chills a week prior to admission. This occurred approximately two hours into dialysis. He states that at one point he had showered and had thought he had covered his dialysis catheter well, but it still got wet. He did not have any redness or drainage from the catheter, although he did feel that one of the sutures was causing him discomfort. He felt well in between dialysis sessions last week. He had blood cultures obtained at the dialysis center on 01/15/2012 which reportedly are growing a gram-negative rod. He was seen in the emergency room on 01/18/2012 and blood cultures were negative. He was given IV antibiotics in the emergency room and given an oral antibiotic to take at home, which he did not fill. Three days later he was admitted to the hospital. Blood cultures from admission to the hospital are negative. His catheter was removed on 01/22/2012 and the catheter tip is growing a coagulase-negative staph. He was initially treated with Zosyn and vancomycin. The vancomycin has been stopped. The culture from the dialysis center is not currently available for review. He is feeling significantly better. He had a temporary catheter placed today and had dialysis and tolerated it well.  ALLERGIES: Sulfa drugs.   PAST MEDICAL HISTORY:  1. Hepatitis C infection, diagnosed in October 2013 when he started dialysis.   2. End-stage renal disease, on hemodialysis.  3. Hypertension.   SOCIAL HISTORY: The patient lives with friends. He is a prior smoker and drinker, but quit approximately four months ago. He denies any injecting drug use history. He has no pets at home.   FAMILY HISTORY: Positive for diabetes and cancer.   REVIEW OF SYSTEMS: GENERAL: Positive fevers, chills, and malaise. HEENT: No headaches. No sinus congestion. No sore throat. NECK: No stiffness. No swollen glands. RESPIRATORY: No cough. No sputum production. No wheezing. He has had some dyspnea on exertion. CARDIAC: No chest pain. No peripheral edema. GI: No nausea, no vomiting, no abdominal pain, and no change in his bowels. GENITOURINARY: No change in his urine. He still makes urine regularly. MUSCULOSKELETAL: No joint or muscle complaints. SKIN: No rashes. NEUROLOGIC: No focal weakness. PSYCHIATRIC: No complaints. All other systems are negative.   PHYSICAL EXAMINATION:   VITAL SIGNS: T-max 99.5, T-current 98.1, pulse 54, blood pressure 168/84, and 99% on room air.   GENERAL: A 59 year old black man in no acute distress.   HEENT: Normocephalic, atraumatic. Pupils equal and reactive to light. Extraocular motion intact. Sclerae, conjunctivae, and lids without evidence for emboli or petechiae. Oropharynx shows no erythema or exudate. Teeth and gums are in fair condition.   NECK: Supple. Full range of motion. Midline trachea. No lymphadenopathy. No thyromegaly.   LUNGS: Clear to auscultation bilaterally with good air movement. No focal consolidation.   HEART: Regular rate and rhythm without murmur, rub, or gallop.   ABDOMEN: Soft, nontender, and nondistended. No hepatosplenomegaly. No hernia is noted.   EXTREMITIES: No evidence for  tenosynovitis.   SKIN: No rashes. He has a bandage over the right chest, at his prior hemodialysis catheter site. He also had a temporary catheter in the right groin. Neither of these areas are particularly  tender. There are no other rashes noted. No stigmata of endocarditis, specifically no Janeway lesions or Osler nodes.   NEUROLOGIC: The patient is awake and interactive, moving all four extremities.   PSYCHIATRIC: Mood and affect appeared normal.   LABS/RADIOLOGIC STUDIES: BUN is 41, creatinine 7.50, bicarbonate 23, and anion gap 8. White count 6.0, hemoglobin 9.3, platelet count 257, and ANC 3.6. White count on admission was 4.0 and rose as high as 11.8. White count in the emergency room, on 01/18/2012 was 5.3.   Blood cultures from 01/18/2012 are negative. Blood cultures from 01/21/2012 are negative.   Urinalysis from admission was unremarkable.   Urine culture from admission showed no growth.   Catheter tip culture is growing coagulase-negative staph.   Blood cultures from 01/23/2012 show no growth.   Blood cultures reported from 01/15/2012, in hemodialysis, are growing a gram-negative rod, but are not available for review.   A chest x-ray showed no acute infiltrates.   IMPRESSION: This is a 59 year old male with a history of end-stage renal disease on hemodialysis and hepatitis C infection who is admitted with fevers and chills at hemodialysis.   RECOMMENDATIONS:  1. He had a week of fevers and chills. His hemodialysis catheter has been removed. Blood cultures are negative, from the ER and on admission. He was given IV therapy in the ER, but did not get the oral antibiotics filled. His catheter tip grew coagulase-negative staph. This could be a contaminate. There is a report of a gram-negative rod from hemodialysis approximately a week before admission. Sensitivities and identification are not available for review at this time.  2. He is feeling better since his hemodialysis catheter was removed and he has been on antibiotics.  3. We will continue Zosyn. We will await the results of his culture.  4. Vancomycin has been stopped.  5. As his blood cultures are currently negative, he can  get a PermCath placed when possible.      This is a moderately complex infectious disease case. Thank you very much for involving me in Mr. Wolfinger care. ____________________________ Heinz Knuckles. Susette Seminara, MD meb:slb D: 01/24/2012 16:52:21 ET T: 01/25/2012 10:12:06 ET JOB#: [id number]  cc: Heinz Knuckles. Satvik Parco, MD, <Dictator> Mckade Gurka E Goebel Hellums MD ELECTRONICALLY SIGNED 01/26/2012 14:11

## 2014-06-11 NOTE — H&P (Signed)
PATIENT NAME:  [name], TOWERS MR#:  [id number] DATE OF BIRTH:  1955/08/26  DATE OF ADMISSION:  12/12/2011  ER REFERRING PHYSICIAN: Lenise Arena, MD  PRIMARY CARE PHYSICIAN: None.   CHIEF COMPLAINT: Weakness, shortness of breath, dizziness.   HISTORY OF PRESENT ILLNESS: The patient is a 59 year old male who reports that he was diagnosed with diabetes more than 17 years ago while he was in prison and was taking Glucophage once he was released from prison, he discontinued taking all his medications, and as far as he knows he has no other medical problems. He does smoke cigarettes and has been a heavy alcohol abuser of about a 12-pack per day for the last 30 years. He reports that he quit cold [place] about three weeks ago. He denies any drug abuse. For the past 1 to 1-1/2 weeks he has been feeling weak, short of breath, dizzy, and having nausea and vomiting every single day. Therefore, he decided to come to the Emergency Room to get evaluated. He denies any hematemesis, melena, bright red bleeding per rectum or any blood in stool. He denies any belly pain or chest pain. No baseline labs were available for the patient, but  he appears to be in renal failure with a creatinine of 24.84. He possibly also has undiagnosed hypertension.   ALLERGIES: Sulfa.   MEDICATIONS: None.   PAST MEDICAL HISTORY:  1. History of diabetes for which he was taking Glucophage at one point but has not done so for more than 10 years.  2. Denies any history of hypertension, hyperlipidemia, anemia or  kidney problems.    PAST SURGICAL HISTORY: None.   FAMILY HISTORY: Mother had diabetes. Sister has diabetes. He is not aware if his father had any  medical problems.   SOCIAL HISTORY: He smokes 1 pack over 3 days for more than 30 years. He used to drink daily beer, about a 12-pack a day, can size 12 ounces. He reports that he quit cold [place] about three weeks ago. He denies any drug abuse. He is widower and lives with his  nephew.   REVIEW OF SYSTEMS: CONSTITUTIONAL: Denies any fever. Reports fatigue, weakness. EYES: Denies any blurred or double vision. ENT: Denies any tinnitus, ear pain. RESPIRATORY: Denies any cough, wheezing. CARDIOVASCULAR: Denies any palpitations, syncope. GI: Reports nausea, vomiting.  Denies any diarrhea or constipation. GENITOURINARY: Denies any oliguria, dysuria, hematuria. ENDOCRINE: Denies any polyuria or nocturia. HEME/LYMPH: Denies any anemia or easy bruisability. INTEGUMENTARY: Has vitiligo. Denies any acne or rash. MUSCULOSKELETAL: Denies any swelling, gout. NEUROLOGICAL: Denies any numbness or weakness. PSYCHIATRIC: Denies any anxiety or depression.   PHYSICAL EXAMINATION:  VITAL SIGNS: Temperature 97.7, heart rate 92, respiratory rate 24, blood pressure 191/101, pulse oximetry 90%.   GENERAL: The patient is a 59 year old male laying comfortably in bed, not in acute distress.   HEENT: Head: Atraumatic, normocephalic. Eyes: There is pallor. No icterus or cyanosis. Pupils are equal, round and reactive to light and accommodation. Extraocular movements are intact. ENT: Wet mucous membranes. No oropharyngeal erythema or thrush.   NECK: Supple. No masses. No JVD. No thyromegaly. No lymphadenopathy.   CHEST WALL: No tenderness to palpation. Not using accessory muscles of respiration. No intercostal muscle retractions.   LUNGS: Bilaterally clear to auscultation. No wheezing, rales  or rhonchi.   CARDIOVASCULAR: S1, S2 regular. No murmur, rubs, or gallops.   ABDOMEN: Soft, nontender, nondistended. No guarding or rigidity. No organomegaly.   SKIN: The patient has vitiligo, no other rashes or lesions.  PERIPHERIES: No pedal edema,  2+ pedal pulses.   MUSCULOSKELETAL: No cyanosis or clubbing.   NEUROLOGIC: Awake, alert, oriented x3. Nonfocal neurological exam. Cranial nerves grossly intact.   PSYCHIATRIC: Normal mood and affect.   LABORATORY, DIAGNOSTIC AND RADIOLOGICAL DATA: PT  15.7, INR 1.2. Chest x-ray shows no evidence of any acute abnormality. Glucose 77, BUN 192, creatinine 24.84, sodium 139, potassium 4.3, chloride 106, CO2 7, calcium less than 5, bilirubin 0.4, alkaline phosphatase 99, ALT 37, AST 55, total protein 8.2, albumin 3.4, osmolality 340. White count 9.1, hemoglobin 6.6, hematocrit 19.4, platelet count 261. Troponin 0.09. BNP [id number].   ASSESSMENT AND PLAN: A 59 year old male with history of alcohol use and smoking, presents with nausea, weakness and dizziness.   1. Acute renal failure: It is not clear whether the patient has acute renal failure versus acute renal failure on chronic kidney disease as there is no baseline. Labs are not available for comparison. I have discussed the case with Dr. Candiss Norse, who recommends doing dialysis on the patient tomorrow. We will obtain a Vascular Surgery consult for placement of a temporary dialysis catheter. We will not give the patient any fluids since dialysis is planned for tomorrow. He has also been complaining of shortness of breath and has elevated BNP. We will avoid any nephrotoxic medications, monitor strict ins and outs and daily weights. 2. Accelerated hypertension: The patient likely has underlying undiagnosed hypertension We will start him on beta blocker, Norvasc and clonidine. Will place on p.r.n. hydralazine and adjust medications as needed to achieve good hypertensive control.  We will avoid any ACE inhibitors.  3. Anemia: The patient's hemoglobin is 6.6. No prior labs are available for comparison. He was guaiac-negative in the Emergency Room. The anemia is normocytic. This could be of chronic kidney disease. We will transfuse the patient 1 unit of blood. He may benefit from some Procrit. We will order additional work-up including iron studies, ferritin, vitamin [id number], folic acid, recheck a stool guaiac. We will also obtain a GI consultation.  4. Anion gap acidosis: This is possibly due to acute renal failure. Plan is  for dialysis tomorrow morning.  5. Hypocalcemia: This could again be related to the patient's acute renal failure. We will obtain additional work-up including ionized calcium, PTH, vitamin D levels, urinary calcium and urinary calcium. He has been given one dose of IV calcium gluconate in the Emergency Room. We will start him on oral calcium supplementation. 6. Mildly elevated AST: We will continue to monitor.  7. Elevated troponin: This could be due to demand ischemia with his poor clearance due to acute renal failure. We will check serial cardiac enzymes and check an echo.  8. Elevated BNP at 26,932: This is possibly related to acute renal failure.  We will not give the patient any fluids for the time being.  9. History of diabetes: The patient reports that he was taking Glucophage at one time. His Accu-Chek today, his serum glucose today was 77. We will check a hemoglobin A1c.  10. History of smoking: The patient has been counseled about cessation for more than three minutes. We will provide with a nicotine patch.   I discussed with the ED physician, discussed with Dr. Candiss Norse, discussed with the patient the plan of care and management.   TIME SPENT: 75 minutes.  ____________________________ Cherre Huger, MD sp:cbb D: 12/12/2011 15:30:02 ET T: 12/12/2011 16:17:15 ET JOB#: [id number]  cc: Cherre Huger, MD, <Dictator> Cherre Huger MD ELECTRONICALLY SIGNED 12/12/2011 20:36

## 2014-06-11 NOTE — Op Note (Signed)
PATIENT NAME:  [name], GEEN MR#:  [id number] DATE OF BIRTH:  January 26, 1956  DATE OF PROCEDURE:  01/28/2012  PREOPERATIVE DIAGNOSES:  1. End-stage renal disease requiring hemodialysis.  2. Catheter-related sepsis.   POSTOPERATIVE DIAGNOSES:  1. End-stage renal disease requiring hemodialysis.  2. Catheter-related sepsis.   PROCEDURE PERFORMED: Creation of a left radiocephalic fistula.   SURGEON: Katha Cabal, MD  ANESTHESIA: General by LMA.   FLUIDS: Per anesthesia record.   ESTIMATED BLOOD LOSS: Minimal.   SPECIMEN: None.   INDICATIONS: [name] is a 59 year old gentleman presented with catheter-related sepsis. He has undergone removal of his catheter. He is currently maintained with a temporary catheter in the groin. He is therefore undergoing creation of an arm access. On induction fairly nice sized cephalic vein was identified and therefore radiocephalic fistula is being created.   DESCRIPTION OF PROCEDURE: Patient is taken to the Operating Room, placed in supine position. After adequate anesthesia is induced cephalic vein is identified quite readily. Linear incision is created and dissection is carried down to expose the cephalic vein. Side branches are ligated with 3-0 silk ties. The vein is marked. Radial artery is then exposed and looped proximally and distally. Vein is ligated distally with a 3-0 silk tie, transected. It is dilated with Donna Christen coronary dilators up to 3.5 mm. The artery is then controlled with vessel loops proximally and distally. Arteriotomy is made, extended with Potts scissors. Stay suture of 6-0 Prolene are placed. The Garretts are then used, 2.5  proximally and a 2 distally to dilate the artery. Excellent blood flow is noted forward as well as in retrograde. End vein to side radial artery anastomosis is then fashioned using running 6-0 Prolene. Flushing maneuvers are performed and the vein is subsequently dilated. Soft thrill is noted, moderate pulsatility.  Two side branches are then ligated and the wound is irrigated, inspected for hemostasis and then closed using interrupted 3-0 Vicryl followed by 4-0 Monocryl subcuticular and Dermabond. Patient tolerated procedure well and there were no immediate complications.    ____________________________ Katha Cabal, MD ggs:cms D: 01/28/2012 16:59:26 ET T: 01/29/2012 09:13:37 ET JOB#: [id number]  cc: Katha Cabal, MD, <Dictator> Murlean Iba, MD  Katha Cabal MD ELECTRONICALLY SIGNED 02/09/2012 16:23

## 2014-06-11 NOTE — Consult Note (Signed)
Chief Complaint:   Subjective/Chief Complaint patient feeling some better today. denies n/v or abdominalpain.   VITAL SIGNS/ANCILLARY NOTES: **Vital Signs.:   22-Oct-13 15:24   Vital Signs Type 1 hr Post Blood   Temperature Temperature (F) 99   Celsius 37.2   Temperature Source oral   Pulse Pulse 69   Respirations Respirations 18   Systolic BP Systolic BP 657   Diastolic BP (mmHg) Diastolic BP (mmHg) 61   Mean BP 78   Pulse Ox % Pulse Ox % 97   Oxygen Delivery Room Air/ 21 %    19:38   Vital Signs Type Routine   Temperature Temperature (F) 98.6   Celsius 37   Temperature Source Oral   Pulse Pulse 68   Respirations Respirations 18   Systolic BP Systolic BP 846   Diastolic BP (mmHg) Diastolic BP (mmHg) 72   Mean BP 86   Pulse Ox % Pulse Ox % 98   Pulse Ox Activity Level  At rest   Oxygen Delivery Room Air/ 21 %   Brief Assessment:   Cardiac Regular    Respiratory clear BS    Gastrointestinal details normal Soft  Nontender  Nondistended  No masses palpable  Bowel sounds normal   Lab Results: Hepatic:  22-Oct-13 07:25    Albumin, Serum  2.8  Routine Chem:  22-Oct-13 07:25    Glucose, Serum  146   BUN  144   Creatinine (comp)  20.09   Sodium, Serum 138   Potassium, Serum  3.2   Chloride, Serum 104   CO2, Serum  14   Calcium (Total), Serum  5.5   Phosphorus, Serum  > 18.0   Anion Gap  20   Osmolality (calc) 325   eGFR (African American)  3   eGFR (Non-African American)  2 (eGFR values <60m/min/1.73 m2 may be an indication of chronic kidney disease (CKD). Calculated eGFR is useful in patients with stable renal function. The eGFR calculation will not be reliable in acutely ill patients when serum creatinine is changing rapidly. It is not useful in  patients on dialysis. The eGFR calculation may not be applicable to patients at the low and high extremes of body sizes, pregnant women, and vegetarians.)   Result Comment CA - RESULTS VERIFIED BY REPEAT  TESTING.  - NOTIFIED OF CRITICAL VALUE  - C/NICOLE MINOR 12/14/11 '@0935'  SFS  - READ-BACK PROCESS PERFORMED.  Result(s) reported on 14 Dec 2011 at 09:55AM.  Routine Hem:  22-Oct-13 07:25    WBC (CBC) 7.2   RBC (CBC)  2.23   Hemoglobin (CBC)  6.3   Hematocrit (CBC)  18.8   Platelet Count (CBC) 248   MCV 84   MCH 28.2   MCHC 33.4   RDW 13.6   Neutrophil % 76.8   Lymphocyte % 7.7   Monocyte % 13.9   Eosinophil % 1.1   Basophil % 0.5   Neutrophil # 5.6   Lymphocyte #  0.6   Monocyte # 1.0   Eosinophil # 0.1   Basophil # 0.0 (Result(s) reported on 14 Dec 2011 at 09:37AM.)   Radiology Results: [place]    20-Oct-13 22:14, [place]   [place]    REASON FOR EXAM:    ARF  COMMENTS:       PROCEDURE: [place]  - Dec 12 2011 10:14PM     RESULT: Grayscale and color flow Doppler evaluation of the kidneys was   performed.  On the right the echotexture the renal cortex is greater than that of the   adjacent liver consistent with medical renal disease. There is no   evidence of obstruction of the right or left kidney. No calcified stones   are evident. The right kidney measures 10.1 x 5.2 x 4.8 cm. The left   kidney measures 9.5 x 4.2 x 4.7 cm. Within the bladder ureteral jets were   demonstrated bilaterally.    IMPRESSION:   1. There is no evidence of obstruction of either kidney.  2. The echotexture of the kidneys is increased consistent with medical   renal disease.     Dictation Site: 5          Verified By: DAVID A. 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

## 2014-06-11 NOTE — Consult Note (Signed)
Patient known to our service for ESRD.  Has an access placement planned soon.  Admitted with GNR bacteremia.  Permcath will need to be removed.  Will place temp cath for next dialysis treatment early next week and will need Permcath later next week when bloodstream infection clears for several days.  Electronic Signatures: Algernon Huxley (MD)  (Signed on 30-Nov-13 10:38)  Authored  Last [id number] 10:38 by Algernon Huxley (MD)

## 2014-06-11 NOTE — H&P (Signed)
PATIENT NAME:  [name], [name] MR#:  [id number] DATE OF BIRTH:  Apr 16, 1955  DATE OF ADMISSION:  01/21/2012  PRIMARY CARE PHYSICIAN: [facility]   CHIEF COMPLAINT: Chills and fever at dialysis.   HISTORY OF PRESENT ILLNESS: The patient is a 59 year old male who presented to the Emergency Room after developing chills at dialysis today. The patient actually had similar symptoms on his previous dialysis treatment about two days ago, came to the ER but was discharged back on p.o. antibiotics, ciprofloxacin. Today he had another low-grade fever of 100.7 at the dialysis unit and was sent to the ER. The patient just has recently been started on dialysis about a month or two ago and was here in October of this past year and had a PermCath placed. The patient presented to the ER again with a temperature 100.7 and was suspected to have sepsis secondary to infected PermCath. Hospitalist Services were contacted for further treatment and evaluation. The patient denies any chest pain, shortness of breath, any nausea, vomiting, abdominal pain, cough, or any other associated symptoms presently.   REVIEW OF SYSTEMS: CONSTITUTIONAL: Positive documented fever of 100.7. No weight gain, no weight loss. EYES: No blurry or double vision. ENT: No tinnitus or postnasal drip. No redness of the oropharynx. RESPIRATORY: No cough, no wheeze, no hemoptysis, no dyspnea. CARDIOVASCULAR: No chest pain, no orthopnea, no palpitations, no syncope. GASTROINTESTINAL: No nausea, no vomiting, no diarrhea, no abdominal pain, no melena, no hematochezia. GENITOURINARY: No dysuria or hematuria. ENDOCRINE: No polyuria or nocturia, heat or cold intolerance. HEMATOLOGIC:  No anemia, no bruising, no bleeding. INTEGUMENTARY: No rashes. No lesions. MUSCULOSKELETAL: No arthritis, no swelling, and no gout. NEUROLOGIC: No numbness, no tingling, no ataxia, no seizure-type activity. PSYCHIATRIC: No anxiety, no insomnia, no ADD.   PAST MEDICAL HISTORY:   1. End-stage renal disease, on hemodialysis Monday, Wednesday, Friday.  2. Hypertension.  3. Secondary hyperparathyroidism.   ALLERGIES: Sulfa drugs, which cause anaphylaxis.   SOCIAL HISTORY: He used to be a smoker, quit about four months ago. He does have a 30 to 440 pack-year smoking history. No alcohol abuse. No illicit drug abuse. He lives at home with his brother.   FAMILY HISTORY: Mother had diabetes. She died from a cancer of unknown type. He does not remember his father very well.   CURRENT MEDICATIONS:  1. Ciprofloxacin 500 mg b.i.d.  2. Enalapril 2.5 mg daily.  3. Coreg 3.125 mg b.i.d.  4. Calcium acetate two caps t.i.d. with meals.  5. Sensipar 60 mg daily with the largest meal.   PHYSICAL EXAMINATION:  VITAL SIGNS: Presently, temperature is 101.4, pulse 92, respirations 20, blood pressure 117/62, and saturation 97% on room air.   GENERAL: The patient is a pleasant-appearing male in no apparent distress.   HEENT: Atraumatic, normocephalic. Extraocular muscles are intact. Pupils are equal and reactive to light. Sclerae are anicteric. No conjunctival injection. No pharyngeal erythema.   NECK: Supple. No jugular venous distention, no bruits, no lymphadenopathy or thyromegaly.   HEART: Regular rate and rhythm. Tachycardic. No murmurs, rubs, or clicks.   LUNGS: Clear to auscultation bilaterally. No rales, no rhonchi, no wheezes.   ABDOMEN: Soft, flat, nontender, nondistended. Has good bowel sounds. No hepatosplenomegaly appreciated.   EXTREMITIES: No evidence of any cyanosis, clubbing, or peripheral edema. Has +2 pedal and radial pulses bilaterally.   NEUROLOGIC: The patient is alert, awake, and oriented x3 with no focal motor or sensory deficits appreciated bilaterally.   SKIN: Moist and warm with no  rash appreciated.  LYMPHATIC: There is no cervical or axillary lymphadenopathy.   LABORATORY, DIAGNOSTIC AND RADIOLOGICAL DATA: Serum glucose 66, BUN 32, creatinine 5.6,  sodium 136, potassium 3.3, chloride 106, bicarbonate 22. The patient's LFTs are within normal limits. Troponin less than 0.02. White cell count 4, hemoglobin 10.5, hematocrit 31.7, platelet count 233. The patient did have a chest x-ray done which showed no evidence of any acute cardiopulmonary disease.   ASSESSMENT AND PLAN: This is a 59 year old male with a history of end-stage renal disease,  on hemodialysis Monday, Wednesday, Friday, hypertension, secondary hyperparathyroidism, history of hepatitis C, presents to the hospital after fever, chills at dialysis today, noted to have a fever of 101.7 now, likely here due to sepsis from possible infected PermCath.   1. Sepsis: The patient presents with fever and chills at dialysis. He had a similar episode done during his previous dialysis treatment two days ago. I suspect this is probably sepsis related to his infected PermCath although there is no induration or redness around his PermCath presently. Go ahead and empirically start him on vancomycin and Zosyn, follow his blood cultures, give him some p.r.n. Tylenol for his fever and follow him clinically.  2. Hypertension: He is currently hemodynamically stable. I will continue his Coreg and enalapril.  3. Secondary hyperparathyroidism: Continue Sensipar and calcium acetate.  4. End-stage renal disease: On hemodialysis. The patient gets dialyzed on Monday, Wednesday, Friday. I will go ahead and consult Nephrology regarding this.   CODE STATUS: The patient is a FULL CODE.   TIME SPENT: 45 minutes.  ____________________________ Belia Heman. Verdell Carmine, MD vjs:cbb D: 01/21/2012 18:38:55 ET T: 01/22/2012 08:32:36 ET JOB#: [id number]  cc: Belia Heman. Verdell Carmine, MD, <Dictator> [facility] Henreitta Leber MD ELECTRONICALLY SIGNED 01/27/2012 20:31

## 2014-06-11 NOTE — Consult Note (Signed)
General Aspect Mr. Joya Gaskins is a middle aged gentleman with hx of ESRD.  he presented to the hospital with sepsis syndrome.  we are asked to see him today after he had an episode of hypotension that occured with the induction of anesthesia prior to placement of a dialysis catheter.    Present Illness Pt has a hx of HTN but has been stable in his current meds.  I could not find any records of an echo and he is not aware of any cardiac history.  He was admitted with sepsis due to dialysis catheter infection.    Today he was dialyzed -2.5 liters.  He  had been NPO since last night. At the doctor's order he received his coreg and then went for his surgical procedure.  He developed significant hypotnesion with the induction of anesthesia and the procedure was cancelled.  He denies any chest pain - now or ever.  He is still a bit groggy from the versed.   Physical Exam:   GEN well nourished, thin    HEENT hearing intact to voice, moist oral mucosa    NECK supple    RESP normal resp effort  clear BS    CARD Regular rate and rhythm    ABD denies tenderness  soft    LYMPH negative neck    EXTR negative edema    SKIN normal to palpation    NEURO cranial nerves intact    PSYCH alert, sedated   Review of Systems:   General: No Complaints    Eyes: No Complaints    Neck: No Complaints    Respiratory: No Complaints    Cardiovascular: No Complaints    Review of Systems: All other systems were reviewed and found to be negative     "kidney failure":    DM:    denies:   Home Medications: Medication Instructions Status  Cipro 500 mg oral tablet 1 tab(s) PO 2 times a day Active  carvedilol 3.125 mg oral tablet 1 tab(s) orally 2 times a day Active  enalapril 2.5 mg oral tablet 1 tab(s) orally once a day Active  Sensipar 60 mg oral tablet 1 tab(s) orally once a day with largest meal of the day. Active  calcium acetate 667 mg oral capsule 2 cap(s) orally 3 times a day (with meals).  Active   Lab Results: Hepatic:  29-Nov-13 15:33    Albumin, Serum  3.3   Bilirubin, Total 0.4   Alkaline Phosphatase  157   SGPT (ALT) 32   SGOT (AST)  44   Total Protein, Serum 7.6  02-Dec-13 12:24    Albumin, Serum  2.7  04-Dec-13 08:00    Albumin, Serum  2.9  Routine Micro:  29-Nov-13 15:33    Micro Text Report BLOOD CULTURE   COMMENT                   NO GROWTH IN 36 HOURS   ANTIBIOTIC                        Micro Text Report BLOOD CULTURE   COMMENT                   NO GROWTH IN 36 HOURS   ANTIBIOTIC                        Culture Comment NO GROWTH IN 36 HOURS  Result(s) reported on  23 Jan 2012 at 07:22AM.   Culture Comment NO GROWTH IN 36 HOURS  Result(s) reported on 23 Jan 2012 at 07:22AM.    21:48    Micro Text Report URINE CULTURE   COMMENT                   NO GROWTH IN 36 HOURS   ANTIBIOTIC                        Culture Comment NO GROWTH IN 36 HOURS  Result(s) reported on 23 Jan 2012 at 12:34PM.   Specimen Source CLEAN CATCH  [id number] 11:44    Micro Text Report MISC. AEROBIC CULTURE   ORGANISM 1                COAGULASE NEGATIVE STAPHYLOCOCCUS   COMMENT                   CALL LAB IF SENSITIVITY TESTING REQUIRED   ANTIBIOTIC                        Culture Comment CALL LAB IF SENSITIVITY TESTING REQUIRED  Result(s) reported on 24 Jan 2012 at 12:11PM.   Organism Name COAGULASE NEGATIVE STAPHYLOCOCCUS   Specimen Source CATH TIP   Organism 1 COAGULASE NEGATIVE STAPHYLOCOCCUS  01-Dec-13 14:21    Micro Text Report BLOOD CULTURE   COMMENT                   NO GROWTH IN 28 HOURS   ANTIBIOTIC                        Culture Comment NO GROWTH IN 4 HOURS  Result(s) reported on 25 Jan 2012 at 06:34AM.  02-Dec-13 12:24    Micro Text Report BLOOD CULTURE   COMMENT                   NO GROWTH IN 36 HOURS   ANTIBIOTIC                        Culture Comment NO GROWTH IN 61 HOURS  Result(s) reported on 26 Jan 2012 at 07:19AM.  Routine Chem:  29-Nov-13  15:33    Glucose, Serum 66   BUN  32   Creatinine (comp)  5.26   Sodium, Serum 136   Potassium, Serum  3.3   Chloride, Serum 106   CO2, Serum 22   Calcium (Total), Serum  7.7   Anion Gap 8   Osmolality (calc) 277   eGFR (African American)  13   eGFR (Non-African American)  11 (eGFR values <8m/min/1.73 m2 may be an indication of chronic kidney disease (CKD). Calculated eGFR is useful in patients with stable renal function. The eGFR calculation will not be reliable in acutely ill patients when serum creatinine is changing rapidly. It is not useful in  patients on dialysis. The eGFR calculation may not be applicable to patients at the low and high extremes of body sizes, pregnant women, and vegetarians.)  30-Nov-13 05:27    Glucose, Serum  64   BUN  43   Creatinine (comp)  6.47   Sodium, Serum 136   Potassium, Serum 4.1   Chloride, Serum 106   CO2, Serum  19   Calcium (Total), Serum  7.3   Anion Gap 11   Osmolality (calc) 281   eGFR (  African American)  10   eGFR (Non-African American)  9 (eGFR values <65m/min/1.73 m2 may be an indication of chronic kidney disease (CKD). Calculated eGFR is useful in patients with stable renal function. The eGFR calculation will not be reliable in acutely ill patients when serum creatinine is changing rapidly. It is not useful in  patients on dialysis. The eGFR calculation may not be applicable to patients at the low and high extremes of body sizes, pregnant women, and vegetarians.)  02-Dec-13 04:59    Glucose, Serum 81   BUN  56   Creatinine (comp)  8.41   Sodium, Serum 140   Potassium, Serum 4.1   Chloride, Serum  110   CO2, Serum  18   Calcium (Total), Serum  7.4   Anion Gap 12   Osmolality (calc) 294   eGFR (African American)  7   eGFR (Non-African American)  6 (eGFR values <626mmin/1.73 m2 may be an indication of chronic kidney disease (CKD). Calculated eGFR is useful in patients with stable renal function. The eGFR  calculation will not be reliable in acutely ill patients when serum creatinine is changing rapidly. It is not useful in  patients on dialysis. The eGFR calculation may not be applicable to patients at the low and high extremes of body sizes, pregnant women, and vegetarians.)    12:24    Glucose, Serum  106   BUN  41   Creatinine (comp)  7.50   Sodium, Serum 141   Potassium, Serum 4.1   Chloride, Serum  110   CO2, Serum 23   Calcium (Total), Serum  7.9   Phosphorus, Serum 4.3   Anion Gap 8   Osmolality (calc) 292   eGFR (African American)  8   eGFR (Non-African American)  7 (eGFR values <6056min/1.73 m2 may be an indication of chronic kidney disease (CKD). Calculated eGFR is useful in patients with stable renal function. The eGFR calculation will not be reliable in acutely ill patients when serum creatinine is changing rapidly. It is not useful in  patients on dialysis. The eGFR calculation may not be applicable to patients at the low and high extremes of body sizes, pregnant women, and vegetarians.)   Result Comment labs - This specimen was collected through an   - indwelling catheter or arterial line.  - A minimum of 5ml88mf blood was wasted prior    - to collecting the sample.  Interpret  - results with caution.  Result(s) reported on 24 Jan 2012 at 04:36PM.  04-Dec-13 08:00    Glucose, Serum 75   BUN  40   Creatinine (comp)  7.94   Sodium, Serum 139   Potassium, Serum 5.0   Chloride, Serum 106   CO2, Serum 27   Calcium (Total), Serum  8.2   Phosphorus, Serum  5.8   Anion Gap  6   Osmolality (calc) 286   eGFR (African American)  8   eGFR (Non-African American)  7 (eGFR values <60mL68m/1.73 m2 may be an indication of chronic kidney disease (CKD). Calculated eGFR is useful in patients with stable renal function. The eGFR calculation will not be reliable in acutely ill patients when serum creatinine is changing rapidly. It is not useful in  patients on dialysis. The  eGFR calculation may not be applicable to patients at the low and high extremes of body sizes, pregnant women, and vegetarians.)  Cardiac:  29-Nov-13 15:33    Troponin I < 0.02 (0.00-0.05 0.05 ng/mL or less: NEGATIVE  Repeat testing in 3-6 hrs  if clinically indicated. >0.05 ng/mL: POTENTIAL  MYOCARDIAL INJURY. Repeat  testing in 3-6 hrs if  clinically indicated. NOTE: An increase or decrease  of 30% or more on serial  testing suggests a  clinically important change)  Routine UA:  29-Nov-13 21:48    Color (UA) Yellow   Clarity (UA) Cloudy   Glucose (UA) Negative   Bilirubin (UA) Negative   Ketones (UA) Negative   Specific Gravity (UA) 1.012   Blood (UA) 1+   pH (UA) 5.0   Protein (UA) 100 mg/dL   Nitrite (UA) Negative   Leukocyte Esterase (UA) Negative (Result(s) reported on 21 Jan 2012 at 10:45PM.)   RBC (UA) 1 /HPF   WBC (UA) 2 /HPF   Bacteria (UA) NONE SEEN   Epithelial Cells (UA) <1 /HPF (Result(s) reported on 21 Jan 2012 at 10:45PM.)  Routine Hem:  29-Nov-13 15:33    WBC (CBC) 4.0   RBC (CBC)  3.59   Hemoglobin (CBC)  10.5   Hematocrit (CBC)  31.7   Platelet Count (CBC) 233 (Result(s) reported on 21 Jan 2012 at 04:14PM.)   MCV 88   MCH 29.2   MCHC 33.1   RDW  16.6  30-Nov-13 05:27    WBC (CBC)  11.8   RBC (CBC)  3.15   Hemoglobin (CBC)  8.7   Hematocrit (CBC)  28.1   Platelet Count (CBC) 211   MCV 89   MCH 27.8   MCHC  31.1   RDW  16.5   Neutrophil % 85.2   Lymphocyte % 4.9   Monocyte % 9.5   Eosinophil % 0.3   Basophil % 0.1   Neutrophil #  10.0   Lymphocyte #  0.6   Monocyte #  1.1   Eosinophil # 0.0   Basophil # 0.0 (Result(s) reported on 22 Jan 2012 at 06:27AM.)  02-Dec-13 04:59    WBC (CBC) 9.4   RBC (CBC)  3.39   Hemoglobin (CBC)  9.3   Hematocrit (CBC)  29.9   Platelet Count (CBC) 268   MCV 88   MCH 27.5   MCHC  31.2   RDW  17.1   Neutrophil % 63.7   Lymphocyte % 20.3   Monocyte % 12.7   Eosinophil % 2.4   Basophil % 0.9    Neutrophil # 6.0   Lymphocyte # 1.9   Monocyte #  1.2   Eosinophil # 0.2   Basophil # 0.1 (Result(s) reported on 24 Jan 2012 at 05:38AM.)    12:24    WBC (CBC) 6.0   RBC (CBC)  3.37   Hemoglobin (CBC)  9.3   Hematocrit (CBC)  29.8   Platelet Count (CBC) 257   MCV 89   MCH 27.5   MCHC  31.1   RDW  17.0   Neutrophil % 60.5   Lymphocyte % 25.9   Monocyte % 8.9   Eosinophil % 2.7   Basophil % 2.0   Neutrophil # 3.6   Lymphocyte # 1.5   Monocyte # 0.5   Eosinophil # 0.2   Basophil # 0.1  04-Dec-13 08:00    WBC (CBC) 8.1   RBC (CBC)  3.48   Hemoglobin (CBC)  9.6   Hematocrit (CBC)  30.7   Platelet Count (CBC) 258   MCV 88   MCH 27.5   MCHC  31.1   RDW  16.5   Neutrophil % 62.2   Lymphocyte % 21.9  Monocyte % 13.0   Eosinophil % 1.8   Basophil % 1.1   Neutrophil # 5.0   Lymphocyte # 1.8   Monocyte # 1.0   Eosinophil # 0.1   Basophil # 0.1 (Result(s) reported on 26 Jan 2012 at 08:54AM.)   EKG:   EKG Interp. by me    Interpretation NSR at 74, voltage for LVH, otherwise normal ECG    Sulfa drugs: Swelling  Vital Signs/Nurse's Notes: **Vital Signs.:   04-Dec-13 05:45   Vital Signs Type Routine   Temperature Temperature (F) 97.7   Celsius 36.5   Temperature Source Oral   Pulse Pulse 55   Respirations Respirations 18   Systolic BP Systolic BP 893   Diastolic BP (mmHg) Diastolic BP (mmHg) 91   Mean BP 115   Pulse Ox % Pulse Ox % 98   Pulse Ox Activity Level  At rest   Oxygen Delivery Room Air/ 21 %    12:16   Vital Signs Type Post Dialysis   Temperature Temperature (F) 98.7   Celsius 37   Temperature Source Oral   Pulse Pulse 63   Respirations Respirations 18   Systolic BP Systolic BP 810   Diastolic BP (mmHg) Diastolic BP (mmHg) 87   Mean BP 105   Pulse Ox % Pulse Ox % 97   Pulse Ox Activity Level  At rest   Oxygen Delivery Room Air/ 21 %    14:11   Vital Signs Type Routine   Temperature Temperature (F) 99.6   Celsius 37.5   Temperature  Source Oral   Pulse Pulse 64   Respirations Respirations 20   Systolic BP Systolic BP 175   Diastolic BP (mmHg) Diastolic BP (mmHg) 86   Mean BP 102   Pulse Ox % Pulse Ox % 97   Pulse Ox Activity Level  At rest   Oxygen Delivery Room Air/ 21 %    17:24   Vital Signs Type Post-Procedure   Temperature Temperature (F) 97.5   Celsius 36.3   Pulse Pulse 70   Respirations Respirations 18   Systolic BP Systolic BP 102   Diastolic BP (mmHg) Diastolic BP (mmHg) 78   Mean BP 89   Pulse Ox % Pulse Ox % 96   Pulse Ox Activity Level  At rest   Oxygen Delivery Room Air/ 21 %  *Intake and Output.:   04-Dec-13 05:45   Grand Totals Intake:   Output:  125    Net:  -125 24 Hr.:  255   Urine ml     Out:  125   Urinary Method  Void; Urinal    05:46   Grand Totals Intake:   Output:      Net:   24 Hr.:  255   Weight Type daily   Weight Method Bed   Current Weight (lbs) (lbs) 177.5   Current Weight (kg) (kg) 80.5   Height (ft) (feet) 5   Height (in) (in) 5   Height (cm) centimeters 165.1   BSA (m2) 1.8   BMI (kg/m2) 29.5    Shift 07:00   Grand Totals Intake:   Output:  125    Net:  -125 24 Hr.:  255   Urine ml     Out:  125   Length of Stay Totals Intake:  2235.21 Output:  2225    Net:  10.21    Daily 07:00   Grand Totals Intake:  480 Output:  225    Net:  [place].:  255   Oral Intake      In:  480   Urine ml     Out:  225   Length of Stay Totals Intake:  2235.21 Output:  2225    Net:  10.21    08:10   Grand Totals Intake:   Output:  150    Net:  -150 24 Hr.:  -150   Urine ml     Out:  150   Urinary Method  Void; Urinal    11:56   Grand Totals Intake:   Output:  2000    Net:  -2000 24 Hr.:  -2150   Dialysis Fluid Removed (ml) ml     Out:  2000    12:16   Grand Totals Intake:   Output:      Net:   24 Hr.:  -2150   Weight Type post dialysis   Weight Method Bed   Current Weight (lbs) (lbs) 169.4   Current Weight (kg) (kg) 76.8   Height (ft) (feet) 5    Height (in) (in) 5   Height (cm) centimeters 165.1   BSA (m2) 1.8   BMI (kg/m2) 28.1    Shift 15:00   Grand Totals Intake:   Output:  2150    Net:  -2150 24 Hr.:  -2150   Urine ml     Out:  150   Dialysis Fluid Removed (ml) ml     Out:  2000   Length of Stay Totals Intake:  2235.21 Output:  4375    Net:  -2139.79     Impression Hypotension:  The patient developed significant hypotension during the induction of anesthesia.  He had just had dialysis of 2.1 liters.  He had been NPO for the day.  The nurse gave him his coreg just before he left for his procedure.  I think this combination of events contributed to his hypotension.  His post procedure ECG was normal and I do not think he has had any cardiac event.   We can check an echo to ensure that his LV function is OK.   When this surgical procedure is  rescheduled, I would suggest that it be done prior to dialysis.  In addition, I would not give him coreg on his way to the procedure.    Plan will order an echo. no additional evaluation needed for this episode of hypotension at this point.   Electronic Signatures: Ciarah Peace, Dreama Saa (MD)  (Signed 04-Dec-13 18:06)  Authored: General Aspect/Present Illness, History and Physical Exam, Review of System, Past Medical History, Home Medications, Labs, EKG , Allergies, Vital Signs/Nurse's Notes, Impression/Plan   Last Updated: 04-Dec-13 18:06 by Dantavious Snowball, Dreama Saa (MD)

## 2014-06-11 NOTE — Consult Note (Signed)
Chief Complaint:   Subjective/Chief Complaint episode of emesis, bilious not bloody, this am.  currently denies nausea or abdominal pain. tolerating po.,   VITAL SIGNS/ANCILLARY NOTES: **Vital Signs.:   23-Oct-13 11:05   Vital Signs Type Post Dialysis   Temperature Temperature (F) 98.4   Celsius 36.8   Temperature Source oral   Pulse Pulse 67   Respirations Respirations 18   Systolic BP Systolic BP 827   Diastolic BP (mmHg) Diastolic BP (mmHg) 77   Mean BP 99   Pulse Ox % Pulse Ox % 98   Pulse Ox Activity Level  At rest   Oxygen Delivery Room Air/ 21 %   Brief Assessment:   Cardiac Regular    Respiratory clear BS    Gastrointestinal details normal Soft  Nontender  Nondistended  No masses palpable  Bowel sounds normal   Lab Results: Hepatic:  23-Oct-13 07:15    Albumin, Serum  2.8  Routine Chem:  23-Oct-13 07:15    Glucose, Serum  143   BUN  103   Creatinine (comp)  16.05   Sodium, Serum 138   Potassium, Serum  3.0   Chloride, Serum 101   CO2, Serum  19   Calcium (Total), Serum  6.1   Phosphorus, Serum  8.7   Anion Gap  18   Osmolality (calc) 310   eGFR (African American)  3   eGFR (Non-African American)  3 (eGFR values <27m/min/1.73 m2 may be an indication of chronic kidney disease (CKD). Calculated eGFR is useful in patients with stable renal function. The eGFR calculation will not be reliable in acutely ill patients when serum creatinine is changing rapidly. It is not useful in  patients on dialysis. The eGFR calculation may not be applicable to patients at the low and high extremes of body sizes, pregnant women, and vegetarians.)   Result Comment CALCIUM - RESULTS VERIFIED BY REPEAT TESTING.  - NOTIFIED OF CRITICAL VALUE  - CALLED RESULT TO DOLL FERGUSON [place] 00786 - 12/15/11-DAC  - READ-BACK PROCESS PERFORMED.  Result(s) reported on 15 Dec 2011 at 08:59AM.  Routine Hem:  23-Oct-13 07:15    Hemoglobin (CBC)  8.0 (Result(s) reported on 15 Dec 2011 at  08:53AM.)   Assessment/Plan:  Assessment/Plan:   Assessment 1) anemia, ncnc.  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  2) reported hematemesis.  recurrent emesis not bloody.  Now on ppi.    Plan 1) awaiting hemoccult. 2) will need luminal evaluation after renal and cardiology dispositions.  Can be done as o/p.  will follow at a distance.   Electronic Signatures: SLoistine Simas(MD)  (Signed 23-Oct-13 19:09)  Authored: Chief Complaint, VITAL SIGNS/ANCILLARY NOTES, Brief Assessment, Lab Results, Assessment/Plan   Last Updated: 23-Oct-13 19:09 by SLoistine Simas(MD)

## 2014-06-11 NOTE — Op Note (Signed)
PATIENT NAME:  [name], RUSSELLO MR#:  [id number] DATE OF BIRTH:  11-Oct-1955  DATE OF PROCEDURE:  01/24/2012  PREOPERATIVE DIAGNOSES:  1. End-stage renal disease.  2. Recent PermCath removal for infection, need for dialysis today.   POSTOPERATIVE DIAGNOSES:    1. End-stage renal disease.  2. Recent PermCath removal for infection, need for dialysis today.   PROCEDURES:   1. Ultrasound guidance for vascular access, right femoral vein.  2. Placement of right femoral Trialysis-type dialysis catheter, 30 cm in length.   SURGEON: Algernon Huxley, M.D.   ANESTHESIA: Local with lidocaine. No intravenous sedation was given.   ESTIMATED BLOOD LOSS: Minimal.   FLUOROSCOPY TIME: None.  CONTRAST: None.   INDICATION FOR PROCEDURE: This is a 59 year old African American male with end-stage renal disease. He had to have his PermCath removed this weekend for bacteremia and infection. The patient requires dialysis today. We are planning on an immediate stick graft later this week. A temporary catheter will be placed. The risks and benefits were discussed. Informed consent was obtained.   DESCRIPTION OF PROCEDURE: The patient is laid flat in his floor bed. The right groin was sterilely prepped and draped and a sterile surgical field was created. The right femoral vein was visualized with ultrasound and found to be widely patent. It was then accessed under direct ultrasound guidance without difficulty with a Seldinger needle. A J-wire was placed. After skin nick and dilatation, the 30 cm. Trialysis-type dialysis catheter was placed over the wire and the wire was removed. All three lumens withdrew dark red, nonpulsatile blood and flushed easily with sterile saline. It was secured at 29 cm with three nylon sutures. Sterile dressing was placed. The patient tolerated the procedure well.   ____________________________ Algernon Huxley, MD jsd:ap D: 01/24/2012 10:50:03 ET T: 01/24/2012 10:56:30  ET JOB#: [id number]  cc: Algernon Huxley, MD, <Dictator> Algernon Huxley MD ELECTRONICALLY SIGNED 01/27/2012 17:01

## 2014-06-11 NOTE — Consult Note (Signed)
Brief Consult Note: Diagnosis: ARF/accelerated htn.   Patient was seen by consultant.   Consult note dictated.   Comments: Appreciate consult for 59 y/o [place] American man with history of etoh abuse for evaluation of anemia. Appreciate renal consult. Noted patient admitted with hgb of 6.6 and creatinine >24, BUN >100. Noted normal Fe studies and normocytic red cells, with normal RDW, normal platelet count. Noted elevated uric acid, CPK/MB/troponin levels Patient reports onset of fatigue, sob with exertion, N/V last week. States that he vomits food stuffs randomly. No history of prior luminal evaluation. Denies abdominal pain, constipation, diarrhea, changes to bowel habits, problems swallowing, acid reflux, and all other GI complaints. Noted that he has stool testing for occult blood, Hep B and C tests, echocardiogram pending. Is on PPI. Does also have some irregular loss of pigmenation to his hands and neck, onset 24yr ago. Has not seen derm.  Impression and Plan: anemia, likely multifactorial with large relation to his kidney disease. No signs of GI bleeding at present and has normal Fe studies. Folic acid level normal, B12 pending.  Agree with stool cards and PPI therapy. His NV also may be related to his uremia: would treat symptomatically and may improve as his system is filtered with dialysis, which he is currently receiving.  Additionally, may benefit form e-po level. Noted ANA and other autoimmune tests ordered.   Patient additionally with extremely elevated BNP and elevation to cardiac enzymes: may benefit from cardiac eval. Will plan for luminal evaluation as his renal and cardiac status improve and he is feeling better. This can be done as outpt, unless patient shows signs of bleeding. Addtionally,. AST may be related to etoh:  would follow for now and consider abdominal US if it does not improve or increases. Thank you for this consult.  Electronic Signatures: Stephens November H (NP)   (Signed 21-Oct-13 15:22)  Authored: Brief Consult Note   Last Updated: 21-Oct-13 15:22 by Theodore Demark (NP)

## 2014-06-11 NOTE — Consult Note (Signed)
Chief Complaint:   Subjective/Chief Complaint Patient seen and examined chart reviewed, consult staffed.  Please see full consult for recommendations. Following.   Electronic Signatures: Loistine Simas (MD)  (Signed 21-Oct-13 21:54)  Authored: Chief Complaint   Last Updated: 21-Oct-13 21:54 by Loistine Simas (MD)

## 2014-06-11 NOTE — Discharge Summary (Signed)
PATIENT NAME:  [name], [name] MR#:  [id number] DATE OF BIRTH:  Apr 05, 1955  DATE OF ADMISSION:  01/21/2012 DATE OF DISCHARGE:  01/31/2012  HISTORY: For a detailed note, please take a look at the history and physical done on admission by me on 11/29. Please also look at the detailed interim summary which covers the hospital course from 11/29 until 12/06 done by Dr. Max Sane. This covers hospital course from 12/06 until the date of discharge, 12/09.   DISCHARGE DIAGNOSES:  1. Systemic inflammatory response syndrome.  2. End-stage renal disease on hemodialysis.  3. Secondary hyperparathyroidism. 4. Serratia bacteremia.  5. Hypotension likely secondary due to systemic inflammatory response syndrome.   DIET: The patient is being discharged on a low sodium renal diet.   ACTIVITY: As tolerated.   FOLLOWUP:  1. Follow-up with the [facility] in the next 1 to 2 weeks.  2. The patient is also to resume his dialysis on Monday, Wednesday, Fridays which are his scheduled days.   DISCHARGE MEDICATIONS:  1. Enalapril 2.5 mg daily.  2. Coreg 3.125 mg b.i.d.  3. Sensipar 60 mg daily with the largest meal.  4. Calcium acetate 667 mg two caps t.i.d. with meals.  5. Ciprofloxacin 500 mg daily x5 days.   CONSULTANTS DURING THE HOSPITAL COURSE:  1. Dr. Anthonette Legato and Dr. Murlean Iba, nephrology. 2. Dr. Legrand Como Blocker, infectious disease.  3. Dr. Hortencia Pilar, vascular surgery.   PERTINENT SURGERIES/PROCEDURES DONE DURING HOSPITAL COURSE:  1. Removal of a right subclavian dialysis PermCath on 01/22/2012 and placement of a temporary right femoral dialysis catheterization on the same date, 11/30.  2. Creation of a left radiocephalic AV fistula for dialysis on the left forearm done on 12/06. Placement of a left jugular PermCath for dialysis done on 01/31/2012.   INTERIM HOSPITAL COURSE: This is a 59 year old male with medical problems as mentioned above who presented to the hospital with  fevers at dialysis and suspected to have systemic inflammatory response syndrome and possible sepsis secondary to infected dialysis catheter.  1. Systemic inflammatory response syndrome. This was likely thought to be related to an infected dialysis catheter. This was removed initially in the hospitalization on 11/30, the day after admission. He had a temporary catheter placed in his groin. His blood cultures here in the hospital did not go anything although on outpatient dialysis it did grow Serratia which was sensitive to ciprofloxacin. He was seen by infectious disease and was given two weeks of ciprofloxacin therapy from the day of the infected catheter removal. Again, he has been afebrile and hemodynamically stable and his blood cultures have been negative while being in the hospital.  2. End-stage renal disease on hemodialysis. The patient normally gets dialyzed on Monday, Wednesday and Friday. The patient was seen by nephrology. As mentioned initially, the patient presented with an infected right subclavian PermCath which was removed. While in the hospital he had a left forearm AV fistula which was created for future dialysis. He also had a right groin temporary catheter which was placed for being dialyzed in the hospital, although that was removed prior to discharge and he a left IJ PermCath placed for dialysis. Presently, he has shown no signs of sepsis and he will resume his dialysis on Monday, Wednesday, Fridays as stated.  3. Hypotension. Prior to getting his AV fistula, the patient became severely hypotensive and that was a result of patient receiving dialysis and getting high dose antihypertensives. He was noted to be orthostatic for only  one day and, therefore, the procedure was canceled on that particular day. Cardiology consult was obtained. The patient was seen by Dr. Cathie Olden from [facility] who cleared him from the cardiovascular perspective to have the procedure done. He did undergo an  echocardiogram which showed normal left ventricular ejection fraction with a dilated mild left atrium and mild mitral regurgitation with no pericardial effusion. The patient did not have any further episodes of hypotension even during dialysis.  4. Secondary hyperparathyroidism. The patient was maintained on his calcium acetate and Sensipar when he will resume upon discharge.  5. Hypertension. The patient had some episodes of bradycardia and, therefore, his Coreg was initially held in the hospital, although his bradycardia has now resolved so, therefore, he is being discharged back on his enalapril and Coreg as stated.  6. Serratia bacteremia. This was likely the cause of the patient's systemic inflammatory response syndrome. As mentioned, his blood cultures here remained negative. The patient was seen by infectious disease, who recommended treating him for a total of two weeks since his PermCath removal. Therefore, he was treated for nine days with IV ciprofloxacin here and is being discharged on five more days of p.o. Cipro as stated.   The patient is a FULL CODE. He is in agreement with the plan.   TIME SPENT ON DISCHARGE: 40 minutes.    ____________________________ Belia Heman. Verdell Carmine, MD vjs:ap D: 02/01/2012 08:09:02 ET T: 02/01/2012 13:49:52 ET JOB#: [id number]  cc: Belia Heman. Verdell Carmine, MD, <Dictator> [facility] Henreitta Leber MD ELECTRONICALLY SIGNED 02/17/2012 14:40

## 2014-06-11 NOTE — Op Note (Signed)
PATIENT NAME:  [name], [name] MR#:  [id number] DATE OF BIRTH:  12-14-1955  DATE OF PROCEDURE:  01/31/2012  PREOPERATIVE DIAGNOSES:  1. End-stage renal disease.  2. Recent catheter related sepsis with removal of PermCath with cultures now clear.  3. Hypertension.   POSTOPERATIVE DIAGNOSES:  1. End-stage renal disease.  2. Recent catheter related sepsis with removal of PermCath with cultures now clear.  3. Hypertension.   PROCEDURES PERFORMED:  1. Ultrasound guidance for vascular access, left jugular vein.  2. Fluoroscopic guidance for placement of catheter.  3. Placement of a 23 cm tip to cuff tunneled hemodialysis catheter, left jugular vein.   SURGEON: Algernon Huxley, MD  ANESTHESIA: Local with moderate conscious sedation.   ESTIMATED BLOOD LOSS: Approximately 25 mL.  FLUOROSCOPY TIME: Less than one minute.   CONTRAST USED: None.   INDICATION FOR PROCEDURE: This is a 59 year old African American male with end-stage renal disease. He had his catheter removed little over a week ago for PermCath infection with sepsis. He is brought in today after fistula creation for a new PermCath such that he can receive outpatient dialysis over the next couple of months while his fistula matures. Risks and benefits were discussed. Informed consent was obtained.   DESCRIPTION OF PROCEDURE: Patient is brought to the vascular interventional radiology suite. Neck and chest were sterilely prepped and draped and a sterile surgical field was created. The left jugular vein was visualized with ultrasound and found to be widely patent. It was then accessed under direct ultrasound guidance without difficulty with a Seldinger needle. A J-wire was placed after skin nick and dilatation. The peel-away sheath was placed over the wire. I then anesthetized an area two fingerbreadths below the left clavicle, tunneled from the subclavicular incision to the access site. Using fluoroscopic guidance selected a 23 cm tip to cuff  tunneled hemodialysis catheter. This was tunneled from the subclavicular incision to the access site, placed through the peel-away sheath and the peel-away sheath was removed. Catheter tips were parked in excellent location in the atrium and the appropriate distal connectors were placed. It withdrew blood well and flushed easily with heparinized saline and a concentrated heparin solution was placed. It was secured to the chest wall with two Prolene sutures. A 4-0 Monocryl pursestring suture was placed at the exit site and a 4-0 suture was used to close the access site. Sterile dressing was placed. The patient tolerated procedure well and was taken to the recovery room in stable condition.    ____________________________ Algernon Huxley, MD jsd:cms D: 01/31/2012 10:36:37 ET T: 01/31/2012 10:49:18 ET JOB#: [id number]  cc: Algernon Huxley, MD, <Dictator> Algernon Huxley MD ELECTRONICALLY SIGNED 02/02/2012 14:05

## 2014-06-11 NOTE — Discharge Summary (Signed)
PATIENT NAME:  [name], [name] MR#:  [id number] DATE OF BIRTH:  30-Nov-1955  DATE OF ADMISSION:  12/12/2011 DATE OF DISCHARGE:  12/17/2011  PRIMARY CARE PHYSICIAN: Patient's primary care physician will be a the [facility]  NEPHROLOGIST: Dr. Candiss Norse  FINAL DIAGNOSES:  1. End-stage renal disease, now on hemodialysis starting Monday, Wednesday and Friday.  2. Anemia.  3. Anion gap acidosis.  4. Hyperparathyroidism.  5. Low vitamin D.  6. Hypocalcemia.   7. Tobacco abuse.  8. Acute on chronic diastolic heart failure.  9. Elevated troponin.  10. Hepatitis C.   MEDICATIONS ON DISCHARGE:  1. Enalapril 2.5 mg daily.  2. Calcium carbonate 2 tablets 3 times a day.  3. Coreg 3.125 mg twice a day.  4. Nicotine patch 14 mg one patch transdermally daily.  5. Calcium and vitamin D 500 mg/200 international units twice a day.  6. Cholecalciferol, which is vitamin D, 1000 international units daily.   DIET: Renal diet, regular consistency.   ACTIVITY: Activity as tolerated.   FOLLOW UP Follow up with dialysis at [facility] Monday, Wednesday and Friday.  REASON FOR ADMISSION: Patient was admitted 12/12/2011, discharged 12/17/2011. Patient came in with weakness and shortness of breath and dizziness.   HISTORY OF PRESENT ILLNESS: 59 year old man with diabetes, was on Glucophage, discontinued taking his medications, he smokes cigarettes, alcohol drinker. He was admitted with acute renal failure requiring urgent dialysis. A temporary dialysis catheter was placed by vascular surgery. Patient's blood pressure was very elevated upon presentation and was started on beta blocker, Norvasc and clonidine. Hemoglobin upon admission was 6.6 with anemia and was guaiac-negative. He had an anion gap acidosis, hypocalcemia, elevated troponin.   LABORATORY, DIAGNOSTIC, AND RADIOLOGICAL DATA: EKG showed normal sinus rhythm, left atrial enlargement, left ventricular hypertrophy. Troponin borderline at 0.09. BNP elevated at  26,932. White blood cell count 9.1, hemoglobin and hematocrit 6.6 and 19.4, platelet count 261, glucose 77, BUN 192, creatinine 24.84, sodium 139, potassium 4.3, chloride 106, CO2 7, calcium less than 5, AST slightly elevated at 55, GFR 2. Chest x-ray showed no evidence of acute cardial pulmonary disease. Phosphorus 14.7, magnesium 1, folic acid [id number], ferritin 150, iron binding capacity 286, iron serum 82, INR 1.2, vitamin D 8.9, PTH 10.1, calcium ionized less than 3, vitamin B12 473. Hepatitis B surface antigen negative. Uric acid 8. Protein electrophoresis negative. Parathyroid hormone intact 509. Hepatitis C antibody high at 11. Free kappa light chains 480, which is high. Free lambda light chains 200 which is high. Kappa lambda ratio high at 2.39. C4 34, C3 102. ANCA panel negative. ANA negative. Next troponin borderline at 0.07. Next troponin borderline at 0.08. Ultrasound of the kidneys no evidence of obstruction, medical renal disease. LDL 70, HDL 40, triglycerides 130, hemoglobin A1c 5.3. 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 Echo showed left ventricular systolic function normal. Ejection fraction greater than 55%, impaired left ventricular relaxation. Cold agglutinin negative. Occult blood in the stool negative. Last chemistry and CBC showed white blood cell count 8.1, hemoglobin and hematocrit 7.7 and 23.7, platelet count 258, glucose 121, BUN 73, creatinine 12.96, sodium 137, potassium 3.6, chloride 98, CO2 23, calcium 7.0, albumin 2.9.   HOSPITAL COURSE PER PROBLEM LIST:  1. For the patient's end-stage renal disease on hemodialysis, the patient was started on dialysis during the hospitalization and followed by the nephrologist during the hospitalization. The patient's creatinine  is still very elevated. He tolerated the dialysis well via the temporary catheter. He had a Perm-A-Cath  placed on 12/16/2011. The issue was without insurance we waited for the outpatient facility to accept, they did accept him knowing that he will get his insurance through Medicare in the future. This was set up for Monday as outpatient. He received dialysis last on 12/17/2011 The nephrologist cleared him to go and he will be followed up as outpatient with dialysis Monday, Wednesday, Friday.  2. Anemia. The patient was transfused 2 units of packed red blood cells during the hospital course. His hemoglobin is still low. He has received Procrit with dialysis. The patient was seen by GI. They did recommend outpatient follow up, but since the patient is guaiac-negative does not have to be done urgently. Recommend the Procrit with dialysis.  3. Anion gap acidosis secondary to the renal failure. This had improved with dialysis.  4. Hyperparathyroidism. Patient was put on Tums and was given IV Zemplar with dialysis.  5. Low vitamin D. This is replaced orally.  6. Hypocalcemia. This was replaced orally.  7. Tobacco abuse. Smoking cessation counseling done during this hospitalization. Nicotine patch applied.  8. Acute on chronic diastolic heart failure, most likely secondary to end-stage renal disease. Dialysis to help out with fluid management. The patient was put on Coreg low dose and enalapril low dose.  9. Elevated troponin most likely with the heart failure and renal failure. Doubt myocardial infarction.  10. Hepatitis C found on testing done by nephrology. Once the patient follows up with GI as outpatient can follow up with this also. The patient did see Dr. Gustavo Lah during the hospitalization.   TIME SPENT ON DISCHARGE: 40 minutes.   ____________________________ Tana Conch. Leslye Peer, MD rjw:cms D: 12/17/2011 15:31:02 ET T: 12/19/2011 14:03:59 ET JOB#: [id number]  cc: Tana Conch. Leslye Peer, MD, <Dictator> [facility] Murlean Iba, MD [place] MD ELECTRONICALLY  SIGNED 12/21/2011 4:45

## 2014-06-11 NOTE — Consult Note (Signed)
PATIENT NAME:  [name], [name] MR#:  [id number] DATE OF BIRTH:  1955-11-10  DATE OF CONSULTATION:  01/22/2012  REFERRING PHYSICIAN:  Dr. Candiss Norse from Nephrology   CONSULTING PHYSICIAN:  Algernon Huxley, MD  REASON FOR CONSULTATION: Bacteremia with PermCath in place.   HISTORY OF PRESENT ILLNESS: This is a 59 year old African American male who has end-stage renal disease. He had a PermCath placed several months ago for end-stage renal disease. He is in the process of being evaluated for permanent dialysis access and is scheduled to have his access placement next month. He came to the ER with a two day history of fever. He was found to have bacteremia which was apparently gram-negative rods and admitted for treatment of his infection. His PermCath will need to be removed as fever is about 101.   PAST MEDICAL HISTORY: 1. End-stage renal disease on dialysis Monday, Wednesday, Friday.  2. Hypertension.  3. Hyperparathyroidism.   ALLERGIES: Anaphylaxis to sulfa.   SOCIAL HISTORY: Quit smoking about four months ago. No alcohol abuse. Lives at home with his brother.   FAMILY HISTORY: Mother has diabetes and died of a malignancy. He does not know his father's history.   HOME MEDICATIONS:  1. Ciprofloxacin 500 mg p.o. daily.  2. Enalapril 2.5 mg daily.  3. Coreg 3.125 mg b.i.d.  4. Calcium acetate 2 tabs t.i.d. with meals.  5. Sensipar 60 mg daily.   REVIEW OF SYSTEMS: Positive for fever and overall not feeling well. No intentional weight loss or gain. EYES: No blurry or double vision. EARS: No tinnitus or ear pain. CARDIOVASCULAR: No chest pain or palpitations. RESPIRATORY: No shortness breath or cough. GI: No nausea, vomiting, or diarrhea. GU: No dysuria or hematuria. ENDOCRINE: No heat or cold intolerance. PSYCH: No anxiety or depression. NEUROLOGIC: No TIA, stroke, or seizure. SKIN: No new rash or ulcers. Positive for vitiligo chronically.   PHYSICAL EXAMINATION:   GENERAL: This is a well  developed, well nourished [place] American male not in apparent distress.   VITAL SIGNS: Temperature 98.3, pulse 67, blood pressure 91/49.   HEAD: Normocephalic, atraumatic.   EYES: Sclerae nonicteric. Conjunctivae are clear.   EARS: Normal external appearance. Hearing is intact.   NECK: Supple without adenopathy or JVD.   CHEST: Catheter exits from right chest. There is really no fluctuance or erythema from the catheter. It is mildly tender in that area.   HEART: Regular rate and rhythm.   ABDOMEN: Soft, nondistended, nontender.   EXTREMITIES: Warm and well perfused without cyanosis, clubbing, or edema.   LABORATORY EVALUATIONS: Sodium 136, potassium 4.1, chloride 106, CO2 29, BUN 43, creatinine 6.47, glucose 64, white blood cell count 11.8, hemoglobin 8.7, platelet count 211,000.   ASSESSMENT AND PLAN: This is a 59 year old male who is admitted for fever. Apparently on outpatient culture he had gram-negative rod bacteremia. With bacteremia and a fever his PermCath will need to be removed. This will be done at the bedside.       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   ____________________________ Algernon Huxley, MD jsd:drc D: 01/22/2012 11:29:40 ET T: 01/22/2012 11:44:41 ET JOB#: [id number]  cc: Algernon Huxley, MD, <Dictator> Algernon Huxley MD ELECTRONICALLY SIGNED 01/24/2012 9:28

## 2014-06-11 NOTE — Consult Note (Signed)
01/14/2012 [id number] FINAL C&S,BLOOD AER#1 MICROBIOLOGY - FINAL REPORT AS OF: 01/17/12 08:22 MICROBIOLOGY - FINAL REPORT AS OF: 01/17/12 08:22 REPORT FROM [facility] - FINAL REPORT  [01/17/2012 08:22] Final Report.  [01/17/2012 08:22] Refer to Culture [id number]  [01/17/2012 08:22] For Susceptibilities.  [01/16/2012 08:59] Identification and Susceptibilities to Follow  [01/15/2012 23:53] Gram Stain:  [01/15/2012 23:53] Gram Negative Rods Seen.  [01/15/2012 18:30] CULTURE RCVD-NO GROWTH AT THIS TIME Isolate 1: SERRATIA MARCESCENS  Isolate 1: SERRATIA MARCESCENS  Organism  Isolate 1 Intrp   Isolate 2 Intrp   Isolate 3 Intrp   Isolate 4 Intrp   Isolate 5 Intrp   Isolate 6 Intrp   <=2 S >=64 R <=1 S <=1 S <=1 S <=0.25 S <=1 S <=0.12 S 4 S <=20 S  S=Sensitive, R=Resistant, I=Intermediate    All MIC Values are reported as mcg/ml     Electronic Signatures: Murlean Iba (MD)  (Signed on 03-Dec-13 10:15)  Authored  Last Updated: 03-Dec-13 10:15 by Murlean Iba (MD)

## 2014-06-11 NOTE — Consult Note (Signed)
Impression: 59yo male w/ h/o ESRD on HD and hep C infection admitted with fever and chills at HD.  He had a week of fever and chills.  His HD catheter has been removed.  Blood cultures are negative from the ER and on admission.  He was given IV therapy in the ER, but did not get the oral antibiotics filled.  His cath tip grew CNS.  This could be a contaminant.  There is a report of a GNR from HD.  Sensitivities are not available for review. He is feeling better since his HD catheter was removed and he has been on antibiotics. Will continue zosyn.  Await the results of this culture. Vanco has been stopped.Would continue vanco for 7 days. As his BCx are negative, he can get a permacath placed when possible.   Electronic Signatures: Harvy Riera, Heinz Knuckles (MD) (Signed on 02-Dec-13 16:36)  Authored   Last Updated: 02-Dec-13 16:42 by Louretta Tantillo, Heinz Knuckles (MD)

## 2014-06-14 NOTE — Discharge Summary (Signed)
PATIENT NAME:  [name], RENFRO MR#:  [id number] DATE OF BIRTH:  12/06/1955  DISCHARGE DIAGNOSES:  1.  Acute-on-chronic anemia due to gastrointestinal bleed, stable hemoglobin.  2.  End-stage renal disease, on hemodialysis.  3.  Hypertension.  4.  Endoscopy showed duodenitis. GI recommended colonoscopy. The patient refused to get it done inpatient and he would follow as outpatient.   CONDITION ON DISCHARGE: Stable.   CODE STATUS: FULL CODE.   DISCHARGE MEDICATIONS:  1.  Carvedilol 6.25 mg oral tablet 2 times a day.  2.  Enalapril 10 mg oral tablet once a day.  3.  Renal vitamins 3 times a day.  4.  Amlodipine 10 mg oral once a day.  5.  Sensipar 30 mg oral tablet once a day.  6.  Nicotine patch once a day.  7.  Calcium carbonate 600 mg two times a day.  8.  Ferrous sulfate 325 mg two times a day.  9.  Pantoprazole 40 mg once a day.   DIET ON DISCHARGE: Renal diet. Diet consistency: Regular.   ACTIVITY: As tolerated.   TIMEFRAME TO FOLLOW-UP: Within 1 to 2 weeks in GI clinic. Advised to follow with Dr. Dorothey Baseman office to make arrangements for colonoscopy as outpatient and to continue his hemodialysis as outpatient.   HISTORY OF PRESENT ILLNESS: As per Dr. Fritzi Mandes on 19th of September 2014. The patient was asked to come in the Emergency Room by his primary care physician. His  hemoglobin was found to be 6. He was recently admitted a week ago and discharged on 16th September. At that time, his hemoglobin was 8.9 and he had a follow-up visit with his primary physician. He noticed that his stool was dark, black colored, and he did have some vomiting which was coffee-ground colored,  so went to his primary care physician and they did the hemoglobin, which was 6, and so advised him to come to the Emergency Room, so he was admitted for further management of his acute-on-chronic anemia.   HOSPITAL COURSE AND STAY:  1.  Acute-on-chronic anemia due to GI bleed. His endoscopy was done by Dr. Allen Norris  which showed duodenitis but which was not typical source of his bleeding, and so he suggested to get a colonoscopy done, but the patient refused to get it done as an inpatient at this time and wanted to go home. He agreed to have an appointment with him within 1 to 2 weeks and get it done as outpatient. His hemoglobin remained stable, around 7, and he was tolerating diet nicely. He received 1 unit blood transfusion also while in the hospital.  2.  Other medical issue: End-stage renal disease, on hemodialysis, continued in hospital.  3.  Hypertension, remained stable in the hospital.   [place]: GI with Dr. Allen Norris and nephrology with Dr. Candiss Norse.   IMPORTANT LABORATORY RESULTS IN THE HOSPITAL: Hemoglobin was 6.4 on presentation. Platelet count was 229. BUN was 20. Creatinine 5.25, potassium of 3.7 on admission. Hemoglobin came to 7.1 and 7 after transfusion and remained stable at that level. On discharge, 7.3.   TOTAL TIME SPENT ON THIS DISCHARGE: 40 minutes.     ____________________________ Ceasar Lund Anselm Jungling, MD vgv:np D: 11/15/2012 14:39:00 ET T: 11/15/2012 18:26:19 ET JOB#: [id number]  cc: Ceasar Lund. Anselm Jungling, MD, <Dictator> Lucilla Lame, MD Vaughan Basta MD ELECTRONICALLY SIGNED 11/17/2012 18:35

## 2014-06-14 NOTE — Consult Note (Signed)
PATIENT NAME:  [name], [name] MR#:  [id number] DATE OF BIRTH:  [birth year]  DATE OF CONSULTATION:  11/11/2012  CONSULTING PHYSICIAN:  Lucilla Lame, MD  CONSULTING SERVICE: Gastroenterology.   REASON FOR CONSULTATION: Anemia with black stools and vomiting dark material.   HISTORY OF PRESENT ILLNESS: This patient is a 59 year old gentleman who has chronic renal disease and is on hemodialysis. The patient has chronic anemia, but was found to have a significant drop in his hemoglobin from his baseline. His hemoglobin was 8.9 on September 16 and he was admitted with a hemoglobin this time at 6.4, which is up to 7.1 this morning. The patient states that he had been having some black stools and bloody vomitus. The patient denies any abdominal pain at the present time. He also denies any fevers, chills or NSAID use. I am now being asked to evaluate the patient for black stools, anemia and hematemesis.   PAST MEDICAL HISTORY: End-stage renal disease, hypertension, diabetes type 2, AV fistula.   ALLERGIES: SULFA.   MEDICATIONS:  Amlodipine, aspirin, calcium carbonate, Coreg, enalapril, Renvela, Sensipar.   SOCIAL HISTORY: The patient smokes about 2 to 4 cigarettes a day. Denies drug abuse. Lives in a shelter home.   FAMILY HISTORY: Noncontributory to his present GI problems.   REVIEW OF SYSTEMS: Negative except what is stated above after reviewing the 10-point review of systems.   PHYSICAL EXAMINATION: GENERAL: The patient is alert and oriented x 3, sitting up in bed in no apparent distress.  VITAL SIGNS: Temperature 97.9, pulse 68, respirations 18, blood pressure 134/72, pulse oximetry 97%.  HEENT: Normocephalic, atraumatic. Extraocular motor intact. Pupils equally round and reactive to light and accommodation. NECK: Without JVD, without lymphadenopathy.  LUNGS: Clear to auscultation bilaterally.  HEART: Regular rate and rhythm without murmurs, rubs or gallops.  ABDOMEN: Soft, nontender,  nondistended, without hepatosplenomegaly.  EXTREMITIES: Without cyanosis, clubbing or edema.  NEUROLOGICAL: Grossly intact.  PSYCHIATRIC: Alert and oriented x 3.  SKIN: With vitiligo over his extremities.   LABORATORY DATA: As stated above.   ASSESSMENT AND PLAN: This patient is a 59 year old gentleman who has a history of chronic anemia with end-stage renal disease, but now has acute on chronic anemia with hemoglobin drop from his baseline. The patient also had some hematemesis and black stools. The patient will be set up for an upper endoscopy for tomorrow. The patient has been explained the plan and agrees with it.   Thank you very much for involving me in the care of this patient. If you have any questions, please do not hesitate to call.   ____________________________ Lucilla Lame, MD dw:jm D: 11/11/2012 18:24:42 ET T: 11/11/2012 20:09:31 ET JOB#: [id number]  cc: Lucilla Lame, MD, <Dictator> Lucilla Lame MD ELECTRONICALLY SIGNED 11/15/2012 9:24

## 2014-06-14 NOTE — H&P (Signed)
PATIENT NAME:  Thomas, [name] MR#:  [id number] DATE OF BIRTH:  09/17/1955  PRIMARY CARE PHYSICIAN: [facility].   CHIEF COMPLAINT: Abnormal labs.   HISTORY OF PRESENT ILLNESS: The patient was asked to come in for low hemoglobin. [name] is a 59 year old African American gentleman with past medical history of end-stage renal disease on hemodialysis, type 2 diabetes, not on any medications, history of hypertension, who comes in after he was called by his primary care physician for low hemoglobin. His hemoglobin was found to be around 6.   He recently was admitted for diarrhea and discharged on September 16. At that time, his hemoglobin was 8.9. The patient has history of anemia of chronic disease, likely due to his end-stage renal disease. He is not on any p.o. iron pills or Pepto-Bismol. The patient noticed his stool on Sunday was dark, black-colored. He did have some emesis which he said was "coffee-grounds."   Denies any history of peptic ulcer disease or any GERD-like symptoms. The patient denies any bloody diarrhea or any bloody vomitus. He is being admitted for further evaluation and management.   Next, in the Emergency Room, the patient is currently getting first unit of blood transfusion. He is hemodynamically stable and denies any chest pain, shortness of breath, or any abdominal pain. He ate well, yet he has been eating okay without any episodes of vomiting. The last episode was on past Sunday which was about 5 days ago.   PAST MEDICAL HISTORY:  1.  End-stage renal disease on hemodialysis.  2.  Hypertension.  3.  Type 2 diabetes, not on medication.   PAST SURGICAL HISTORY: AV fistula.   ALLERGIES: SULFA DRUGS.   MEDICATIONS:  1.  Amlodipine 10 mg daily.  2.  Aspirin 81 mg daily.  3.  Calcium carbonate 600 mg b.i.d.  4.  Coreg 6.25 b.i.d.  5.  Enalapril 10 mg daily.  6.  Renvela 2.4 g t.i.d.  7.  Sensipar 30 mg daily.   SOCIAL HISTORY: The patient resides in a shelter  home. Smokes about 3 to 4 cigarettes per day. Denies alcohol use or any other drug use.   FAMILY HISTORY: Positive for hypertension.   REVIEW OF SYSTEMS:  CONSTITUTIONAL: No fever, fatigue, weakness.  EYES: No blurred or double vision or glaucoma or cataracts.  ENT: No tinnitus, ear pain, hearing loss or epistaxis.  RESPIRATORY: No cough, wheeze, hemoptysis.  CARDIOVASCULAR: No chest pain, orthopnea, edema or arrhythmia.  GASTROINTESTINAL: No nausea, vomiting, diarrhea, abdominal pain. Positive for melenic stools GENITOURINARY: No dysuria, hematuria, frequency, or incontinence.  ENDOCRINE: No polyuria, nocturia or thyroid problems.  HEMATOLOGY: No anemia or easy bruising.  SKIN: No acne or rash or any lesions.  MUSCULOSKELETAL: Positive for arthitis. no joint swelling, no gout NEUROLOGIC: No CVA, TIA, or seizure.  PSYCHIATRIC: No anxiety or depression or bipolar disorder.  All other systems reviewed and negative.  PHYSICAL EXAMINATION:  GENERAL: Awake, alert, oriented x3, not in acute distress.  VITAL SIGNS: Afebrile. Pulse is 77. Respirations 18. Blood pressure is 145/65. Sats are 98% on room air.  HEENT: Atraumatic, normocephalic. Pupils are PERRLA. EOMI intact. Oral mucosa is moist. Conjunctivae: Pallor present.  NECK: Supple. No JVD. No carotid bruit.  RESPIRATORY: Clear to auscultation bilaterally. No rales, rhonchi, respiratory distress or labored breathing.  CARDIOVASCULAR: Both the heart sounds are normal. Rate, rhythm regular. PMI not lateralized. Chest is nontender. Good pedal pulses. Good femoral pulses. No lower extremity edema.  ABDOMEN: Soft, benign, nontender.  No organomegaly. Positive bowel sounds.  NEUROLOGIC: Grossly intact cranial nerves II through XII. No motor or sensory deficit.  PSYCHIATRIC: Awake, alert, oriented x3.  SKIN: Warm and dry. The patient has vitiligo patches over the upper extremities.   LABORATORIES: Hemoglobin and hematocrit is 6.4 and 19.3.  White count is 6.0. Platelet count is 229. Glucose is 208. BUN is 20. Creatinine is 5.25. Potassium is 3.7. SGPT is 106. SGOT is 89. Albumin is 3.0.   ASSESSMENT: [name], 59 years old, with history of end-stage renal disease on hemodialysis, comes in to the Emergency Room with:  1.  Acute-on-chronic anemia. The patient's hemoglobin is around baseline around 8.9. Came in with hemoglobin of 6.3. The patient had some dark-colored diarrhea on Sunday along with mild episode of hematemesis, coffee-ground, none now and the patient is able to tolerate diet well. His symptoms have resolved. We will place the patient on proton pump inhibitor daily. Once  hemoglobin remains stable, consider doing gastrointestinal work-up as outpatient. The patient remained stable. I will hold off on aspirin for now. The patient is advised to stay away from nonsteroidal antiinflammatories.  2.  End-stage renal disease on hemodialysis. Nephrology consultation.  3.  Hypertension. Continue home medications, which are amlodipine and carvedilol along with Enalapril.  4.  Deep vein thrombosis prophylaxis. Will give thromboembolic disease stockings. The patient is ambulatory.  5.  Further workup on patient's clinical course. Hospital admission plan was discussed with the patient. No family members present.   TIME SPENT: 50 MINUTES.   ____________________________ Hart Rochester. Posey Pronto, MD sap:np D: 11/10/2012 17:16:00 ET T: 11/10/2012 17:45:17 ET JOB#: [id number]  cc: Kimberly Nieland A. Posey Pronto, MD, <Dictator> [place] MD ELECTRONICALLY SIGNED 11/11/2012 15:05

## 2014-06-14 NOTE — Consult Note (Signed)
Brief Consult Note: Diagnosis: Upper GI bleed. The patient had black stools and vomitied dark material.   Consult note dictated.   Comments: The patient has a history of anemia from chronic renal failure. Now with an acute GI bleed. He will be set up for an EGD for tomorrow.  Electronic Signatures: Lucilla Lame (MD)  (Signed 20-Sep-14 12:40)  Authored: Brief Consult Note   Last Updated: 20-Sep-14 12:40 by Lucilla Lame (MD)

## 2014-06-14 NOTE — Discharge Summary (Signed)
PATIENT NAME:  [name], [name] MR#:  [id number] DATE OF BIRTH:  1956/01/23  DATE OF ADMISSION:  11/05/2012 DATE OF DISCHARGE:  11/07/2012  CONSULTANTS: Dr. Holley Raring from nephrology.   CHIEF COMPLAINT: Passed out.   DISCHARGE DIAGNOSES: 1.  Syncope, likely in the setting of a vasovagal response.  2.  Diarrhea resolved.  3.  End-stage renal disease on dialysis.  4.  Hyperkalemia with EKG changes resolved.  5.  Hypertension.  6.  Positive troponin, likely demand ischemia in the setting of renal failure.  7.  History of hepatitis.  8.  History of tobacco abuse.   DISCHARGE MEDICATIONS: Coreg 6.25 mg 2 times a day, Renvela 2.4 grams oral powder 3 times a day, Sensipar 60 mg once a day with largest meal, amlodipine 10 mg daily, enalapril 2.5 mg daily, Fosrenol 1000 mg, chewable baby aspirin 81 mg daily.   DIET: Low sodium, renal diet.   ACTIVITY: As tolerated.   FOLLOWUP: Please follow with PCP within 1 to 2 weeks. Please go to your next dialysis session as previously scheduled.   DISPOSITION: Home.   SIGNIFICANT LABS AND IMAGING: Initial potassium noted to be 7.4. Last potassium 4.7, initial BUN 133, creatinine 11, last BUN of 47, today creatinine of 6.22. Initial chloride 112. Serum CO2 19. Magnesium 1.9, lipase 441. LFTs showed albumin of 2.9 and total protein 6.3, otherwise within normal limits. Initial troponin negative. Initial CK-MB 3.9. Peak troponin 0.14, peak CK-MB 4.2, hemoglobin was 8.9 on admission. WBC of 9.3. C. difficile negative. Stool  WBC showed no RBC or WBC. Echocardiogram shows an EF of 55% to 60%, impaired relaxation pattern. Chest x-ray, PA and lateral for syncope showed no evidence of pneumonia of acute cardiopulmonary disease.   HISTORY OF PRESENT ILLNESS: AND HOSPITAL COURSE:  For full details of H and P, please see the dictation on 09/14 by Dr. Darvin Neighbours, but briefly this is a 59 year old male who lives in a shelter who came in after experiencing multiple episodes of  diarrhea about 10 and passed out. He stated he was sitting on the commode and possibly straining. He had an episode of stool but then became sweaty, diaphoretic and had to take off his shirt and then passed out. He had no pains in the chest. He was monitored on telemetry initially in CCU and did not have any significant arrhythmias. Of note, he did have hyperkalemia with potassium of 7s. He was treated typical and also was given Kayexalate and underwent dialysis as well. This was normalized. He had an echocardiogram showing normal EF and possible diastolic dysfunction. He is not in acute CHF. Diarrhea resolved. It was C. difficile negative. The syncope was likely vasovagal syncope. He also did say that he had some dizziness after his multiple bouts of diarrhea but the following day or so, the diarrhea tapered off and today it has resolved. He is forming solid stools. He did have a mild troponin, which is likely demand ischemia. He did not have any chest pains. He will be discharged on aspirin. His LDL  is within goal. An echocardiogram was done and noted above. He is to follow with his PCP for further care.   PHYSICAL EXAMINATION: VITAL SIGNS:  Temperature is 98.1, pulse rate is 75, respiratory rate 17, blood pressure 131/74, O2 sat 98% on room air.  GENERAL: The patient is a well-developed male lying in bed in no obvious distress.  HEENT: Normocephalic, atraumatic.  CARDIOVASCULAR:  Auscultation of heart sounds.  He has S1, S2.  Regular rate and rhythm. No significant murmurs appreciated.  LUNGS: Clear to auscultation without wheezing or rhonchi.  ABDOMEN: Soft. He does have upper extremity vitiligo.  NEUROLOGIC:  Cranial nerves are intact.  EXTREMITIES:  He has no significant lower extremity edema.   He will be discharged with outpatient follow-up. He was recommended to follow up with his next dialysis session which is tomorrow.   Total Time Spent: 35 minutes.   CODE STATUS: The patient is FULL  CODE.  ____________________________ Vivien Presto, MD sa:dp D: 11/07/2012 15:14:45 ET T: 11/07/2012 16:00:06 ET JOB#: [id number]  cc: Vivien Presto, MD, <Dictator> Vivien Presto MD ELECTRONICALLY SIGNED 11/14/2012 14:07

## 2014-06-14 NOTE — Consult Note (Signed)
Chief Complaint:  Subjective/Chief Complaint Pt denies any further melena, nausea or vomiting.  Denies abdominal pain.  Hgb 7.3.   VITAL SIGNS/ANCILLARY NOTES: **Vital Signs.:   22-Sep-14 09:40  Temperature Temperature (F) 98  Celsius 36.6  Temperature Source oral  Pulse Pulse 63  Respirations Respirations 16  Systolic BP Systolic BP 0000000  Diastolic BP (mmHg) Diastolic BP (mmHg) 93  Mean BP 107  Pulse Ox % Pulse Ox % 98  Pulse Ox Activity Level  At rest  Oxygen Delivery Room Air/ 21 %   Brief Assessment:  GEN well developed, well nourished, no acute distress, A/Ox3.   Cardiac Regular   Respiratory normal resp effort   Gastrointestinal Normal   Gastrointestinal details normal Soft  Nontender  Nondistended  No masses palpable  Bowel sounds normal   EXTR negative edema, +clubbing   Additional Physical Exam Skin: warm, dry, intact. +vitiligo   Lab Results: Routine Hem:  22-Sep-14 02:41   WBC (CBC) 6.5  RBC (CBC)  2.40  Hemoglobin (CBC)  7.3  Hematocrit (CBC)  22.2  Platelet Count (CBC) 241  MCV 93  MCH 30.6  MCHC 33.1  RDW  14.8  Neutrophil % 63.5  Lymphocyte % 21.5  Monocyte % 12.1  Eosinophil % 2.5  Basophil % 0.4  Neutrophil # 4.1  Lymphocyte # 1.4  Monocyte # 0.8  Eosinophil # 0.2  Basophil # 0.0 (Result(s) reported on 13 Nov 2012 at 03:35AM.)   Assessment/Plan:  Assessment/Plan:  Assessment Acute on chronic anemia of CKD:  Hgb stable s/p transfusion PRBCs.  EGD showed duodenitis.  Not likely culprit of melena.  Offered pt colonoscopy as he has never had one, however he declines colonoscopy at this time.  He does seem to understand risks/benefits.  Urged to consider at a later date.   Plan 1) monitor h/h 2) Pt will call if he changes his mind about colonoscopy 3) Discharge home on daily protonix 40mg    Electronic Signatures: Andria Meuse (NP)  (Signed 22-Sep-14 10:23)  Authored: Chief Complaint, VITAL SIGNS/ANCILLARY NOTES, Brief Assessment, Lab  Results, Assessment/Plan   Last Updated: 22-Sep-14 10:23 by Andria Meuse (NP)

## 2014-06-14 NOTE — Op Note (Signed)
PATIENT NAME:  [name], [name] MR#:  [id number] DATE OF BIRTH:  08-Apr-1955  DATE OF PROCEDURE:  08/01/2012  PREOPERATIVE DIAGNOSIS: Complication of arteriovenous dialysis device with lack of  ability to cannulate left radiocephalic fistula.   POSTOPERATIVE DIAGNOSIS: Complication of arteriovenous dialysis device with lack of  ability to cannulate left radiocephalic fistula.   PROCEDURES PERFORMED: 1.  Contrast injection left radiocephalic fistula.  2.  Coil embolization of 3 separate and distinct branches of the arteriovenous fistula.  3.  Percutaneous transluminal angioplasty to 7 mm, venous portion of arteriovenous fistula.   SURGEON: Katha Cabal, M.D.   SEDATION: Versed 4 mg plus fentanyl 150 mcg administered IV. Continuous ECG, pulse oximetry and cardiopulmonary monitoring was performed throughout the entire procedure by the interventional radiology nurse. Total sedation time is 50 minutes.   ACCESS: 6-French sheath, retrograde direction, left arm arteriovenous fistula.   CONTRAST USED: Isovue 25 mL.   FLUOROSCOPY TIME: 7.5 minutes.   INDICATIONS: [name] is a 59 year old gentleman with a functioning left radiocephalic fistula that they have not been able to accurately cannulate. Duplex ultrasound demonstrates it is patent with good flow, but it remains undersized and is not maturing. Risks and benefits were reviewed, all questions answered, and the patient agrees to proceed with angiography and possible intervention.   DESCRIPTION OF PROCEDURE: The patient is taken to special procedures and placed in the supine position. After adequate sedation is achieved, his left arm is extended palm upward and prepped and draped in a sterile fashion.   Ultrasound is placed in a sterile sleeve. Ultrasound is utilized secondary to lack of appropriate landmarks as the fistula is being accessed at the level of the antecubital fossa, in a retrograde fashion, and is not palpable nor visible.    Fistula is scanned from the arterial anastomosis more proximally. It is noted to be echolucent and compressible which indicates patency. Image is recorded and a micropuncture needle is used to access the fistula after 1% lidocaine has been infiltrated. As noted, image is recorded for the permanent record.   Microwire followed by microsheath, J-wire followed by a 6-French sheath. KMP catheter and Glidewire are then negotiated into the radial artery and the KMP catheter is used to perform angiography. After review of the images, 2 definite problems appear. There is narrowing within the fistula itself and there are multiple very large collateral venous branches.   KMP catheter and Glidewire are then negotiated into the 3 largest branches and a combination of 4 mm x 3 cm Nester and Tornado coils as well as a 4 x 7 cm coil are used, either 2 or 3 coils are used per branch. After each branch is embolized individual angiography is performed to ensure a good result. Once these 3 branches have been treated, there is dramatic improvement in the flow within the fistula. Magic torque wire is then reintroduced and first a 6 x 10 and then a 7 x 10 balloon is used to dilate the fistula throughout its venous portion. Follow-up angiography via KMP catheter, at the level of the anastomosis, demonstrates a significant improvement with much improved flow through the fistula. Pursestring suture of 4-0 Monocryl is placed and the sheath is removed. There are no immediate complications.   INTERPRETATION: The initial images demonstrate multiple large branches siphoning blood and flow away from the fistula. The fistula has a narrowing in its venous portion and is hypo-perfused throughout.   Following coil embolization, as described above, there is dramatic improvement and  then first a 6 and then 7 mm balloon is used to improve the overall profile of the fistula with an excellent result as well.   SUMMARY: Successful salvage of  left radiocephalic fistula, as described above. ____________________________ Katha Cabal, MD ggs:sb D: 08/01/2012 13:04:49 ET T: 08/01/2012 13:32:41 ET JOB#: [id number]  cc: Katha Cabal, MD, <Dictator> Katha Cabal MD ELECTRONICALLY SIGNED 08/09/2012 16:08

## 2014-06-14 NOTE — H&P (Signed)
PATIENT NAME:  [name], INSCOE MR#:  [id number] DATE OF BIRTH:  [birth year]  DATE OF ADMISSION:  11/05/2012  NEPHROLOGIST:  Dr. Candiss Norse  CHIEF COMPLAINT: Syncope.   HISTORY OF PRESENTING ILLNESS: A 59 year old African American male patient with history of hypertension, end-stage renal disease on hemodialysis on Monday, Wednesday, Friday, presents to the Emergency Room brought in by EMS after the patient had an episode of syncope. The patient was in the restroom because of recurrent diarrhea since morning of which he had about 10 episodes of watery one and had an episode of syncope. There is a questionable history of possible seizure, although this is not confirmed. The patient does not remember the episode. He did feel lightheaded before the episode. On arrival to the Emergency Room, the patient's EKG shows tall T waves along with elevated potassium at 7.4, and the patient is being admitted to be hospitalist service being critically ill with hyperkalemia and EKG changes. The patient has received dextrose, IV insulin, calcium gluconate along with albuterol nebulizer in the Emergency Room. He does seem to make some urine.   He does not complain of any chest pain, shortness of breath, palpitations. No history of syncope in the past. He does not remember of any history of hyperkalemia in the past. Medication list is unknown. It is not clear if he is on any ACE or ARB.   PAST MEDICAL HISTORY:   1.  Hypertension.  2.  End-stage renal disease on hemodialysis.  3.  Hepatitis of unknown etiology.  4.  Tobacco abuse.   PAST SURGICAL HISTORY: Left upper extremity AV fistula.   ALLERGIES: SULFA MEDICATIONS.  FAMILY HISTORY: Hypertension, coronary artery disease.   SOCIAL HISTORY: The patient smokes a pack a day. Ambulates on his own.  CODE STATUS:  FULL CODE.      REVIEW OF SYSTEMS:  CONSTITUTIONAL: Complains of fatigue, weakness.  EYES: No blurred vision, pain, redness.  EARS, NOSE, THROAT: No tinnitus,  ear pain, hearing loss.  RESPIRATORY: No cough, wheeze, hemoptysis.  CARDIOVASCULAR: No nausea, vomiting. Has diarrhea, mild abdominal pain.  GENITOURINARY: No dysuria, hematuria, frequency. Does have end-stage renal disease.  ENDOCRINE: No polyuria, nocturia, thyroid problems.  HEMATOLOGIC/LYMPHATIC: Has chronic anemia. No bleeding.  INTEGUMENTARY: No acne, rash, lesions. Has vitiligo.  MUSCULOSKELETAL: No back pain, arthritis.  NEUROLOGICAL: Had syncope.  PSYCHIATRIC: No anxiety or depression.   HOME MEDICATIONS: Are not available at this time.   PHYSICAL EXAMINATION: VITAL SIGNS: Temperature 98.6, pulse of 87, blood pressure 151/92, saturating 100% on room air.  GENERAL: Obese African American male patient lying in bed, comfortable, conversational, cooperative with exam.  PSYCHIATRIC: Alert and oriented x 3. Mood and affect are appropriate. Judgment intact.  HEENT: Atraumatic, normocephalic. Oral mucosa moist and pink. External ears and nose normal. No pallor. No icterus. Pupils bilaterally equal and react to light.  NECK: Supple. No thyromegaly. No palpable lymph nodes. Trachea midline. No carotid bruit, JVD.  CARDIOVASCULAR: S1, S2, without any murmurs. Peripheral pulses 2+. No edema.  RESPIRATORY: Normal work of breathing. Clear to auscultation on both sides.  GASTROINTESTINAL: Soft abdomen, tenderness in the epigastric area. Bowel sounds present. No hepatosplenomegaly palpable.  SKIN: Warm and dry. No petechiae, rash, ulcers. Has lesions from vitiligo.  GENITOURINARY: No CVA tenderness or bladder distention.  MUSCULOSKELETAL: No joint swelling, redness, effusion of the large joints. Normal muscle tone.  NEUROLOGICAL: Motor strength 5 out of 5 in upper extremities.  VASCULAR:  Shows  left upper extremity AV fistula.  LABORATORY AND DIAGNOSTIC DATA: Show glucose 154, BUN 133, creatinine 11, sodium 141, potassium 7.4, chloride 112, bicarb 19, GFR 5 with a calcium of 6.9, magnesium  1.5. Lipase 441.  AST, ALT, alkaline phosphatase, bilirubin normal.  WBC 9.3, hemoglobin 8.9, platelets of 194.  EKG shows normal sinus rhythm, tall T waves. No arrhythmias on telemetry.   ASSESSMENT AND PLAN: 1.  Hyperkalemia with EKG changes in a patient with end-stage renal disease on hemodialysis. The patient has been compliant with hemodialysis. It is not clear why he has the hyperkalemia. It  could be dietary intake. The patient has had diarrhea today which should actually lower his potassium. I will give him a dose of Kayexalate 30 grams stat along with some Lasix IV 80 mg and also IV fluids. I have paged Dr. Holley Raring with Nephrology. The patient will need urgent dialysis with the EKG changes. I will check a repeat potassium now to confirm that he does have hyperkalemia and this was not a lab error.   2.  Syncope: Likely secondary from dehydration from the diarrhea, although I cannot rule out any arrhythmias which could have happened secondary to his hyperkalemia. We will put him on a tele floor, get an echocardiogram. We will check 3 sets of cardiac enzymes.  3.  Hypertension:  His home medications are unknown at this time. We will put him on IV p.r.n. medications. We are trying to get his home medication list at this time.  4.  End-stage renal disease, on hemodialysis. Consult Nephrology. We will discuss with Dr. Holley Raring.  5.  Deep vein thrombosis prophylaxis with heparin.  6.  Anemia of chronic disease, baseline unknown. We will monitor.   CODE STATUS:  FULL CODE.  TIME SPENT TODAY: On this critically ill patient with hyperkalemia, being admitted to CCU with EKG changes, was greater than 75 minutes.  ____________________________ Leia Alf Lemoyne Scarpati, MD srs:cb D: 11/05/2012 20:56:27 ET T: 11/05/2012 21:52:50 ET JOB#: [id number]  cc: Alveta Heimlich R. Darvin Neighbours, MD, <Dictator> Murlean Iba, MD [place] Lilian Kapur, MD Neita Carp MD ELECTRONICALLY SIGNED 11/08/2012 13:53

## 2014-06-14 NOTE — Op Note (Signed)
PATIENT NAME:  [name], [name] MR#:  [id number] DATE OF BIRTH:  1955-09-27  DATE OF PROCEDURE:  07/06/2012  PREOPERATIVE DIAGNOSIS: End-stage renal disease, with functional permanent dialysis access and no longer needing a PermCath.   POSTOPERATIVE DIAGNOSIS: End-stage renal disease, with functional permanent dialysis access and no longer needing a PermCath.   PROCEDURE: Removal of left jugular PermCath.   SURGEONS: Melvyn Neth, PA-C, and Dr. Lucky Cowboy.   ANESTHESIA: Local.   ESTIMATED BLOOD LOSS: None.   INDICATION FOR PROCEDURE: This is a 59 year old African American male with end-stage renal disease. His left radiocephalic AV fistula was functional, and he is no longer needing his PermCath. This will be removed.   DESCRIPTION OF THE PROCEDURE: The patient was brought to the vascular interventional radiology area and positioned supine. The left neck and chest and existing catheter were sterilely prepped and draped and a sterile surgical field was created. The area was locally anesthetized copiously with 1% lidocaine. Hemostats were used to help dissect out the cuff. An 11-blade was used to transect the fibrous sheath connected to the cuff. The catheter was then removed in its entirety without difficulty with gentle traction. Pressure was held at the base of the neck. A sterile dressing was placed. The patient tolerated the procedure well. No complications.    ____________________________ Marin Shutter Isom Kochan, PA-C cnh:dm D: 07/06/2012 08:28:03 ET T: 07/06/2012 08:46:25 ET JOB#: [id number]  cc: Katherinne Mofield N. Crickett Abbett, PA-C, <Dictator> [place] PA ELECTRONICALLY SIGNED 07/24/2012 13:01

## 2014-06-15 NOTE — H&P (Signed)
PATIENT NAME:  [name], [name] MR#:  [id number] DATE OF BIRTH:  10-21-1955  DATE OF ADMISSION:  04/26/2013  PRIMARY CARE PHYSICIAN: [facility]  PRIMARY NEPHROLOGIST: Anthonette Legato, MD  CHIEF COMPLAINT: Dizziness.  HISTORY OF PRESENT ILLNESS: The patient is a pleasant 59 year old male with end-stage renal disease on dialysis Monday, Wednesday, and Friday and hypertension. He also has a history of syncope and GI bleed, possibly upper, late last year. The patient presents with dizziness for about 2 to 3 days with some shortness of breath and acute onset black tarry stools this morning. He thinks he has passed the black stools with some possible dark blood and some clots, about 4 to 5 times already today. He states that he is dizzy with any change in position and when he stands up. He has been having epigastric abdominal pain for 3 weeks, he states. He has had no frank bright red blood per rectum or hematemesis. He has some nausea without vomiting. He presented to the hospital and was noted to have black tarry stools on digital rectal exam, per ER physician, which was strongly guaiac-positive. His hemoglobin is 11.1, which is significantly even more so than previous admission in September for GI bleed where he did require blood transfusions. Furthermore, he is noted to be hyperkalemic with potassium of 5.7 with a BUN of 124. He denies missed dialysis and he states that he is to go to his dialysis session tomorrow.   PAST MEDICAL HISTORY:  1.  Endstage renal disease, on dialysis Monday, Wednesday, and Friday. 2.  Hypertension. 3.  Diabetes, not on any medication. 4.  GI bleed, possibly upper, with duodenitis. 5.  Syncope.   PAST SURGICAL HISTORY: AV fistula in the left upper extremity.   ALLERGIES: SULFA.   OUTPATIENT MEDICATIONS: Amlodipine 10 mg once a day, calcium acetate 667 mg 4 tabs 3 times a day, calcium carbonate 750 mg chewable 2 tabs every 2 hours as needed for indigestion,  enalapril 2.5 mg daily, Renvela 2.4 grams 3 times a day.   FAMILY HISTORY: Hypertension.   SOCIAL HISTORY: Smokes about 4 to 5 cigarettes a day. Lives with son. Denies drugs. Occasional alcohol.   REVIEW OF SYSTEMS: CONSTITUTIONAL: Positive fatigue, weakness, and dizziness.  EYES: No blurry vision or double vision.  ENT: No tinnitus or hearing loss.  RESPIRATORY: Some shortness of breath. No cough, wheezing or hemoptysis.  CARDIOVASCULAR: No chest pain or orthopnea.  GASTROINTESTINAL: Positive for epigastric pain and black tarry stools. Unclear if he is having bloody stools or tarry stools as he states he might have had dark stools with possible blood, which was also dark, and some clots, but per DRE here it was just black, tarry stools. GENITOURINARY: He does still urinate. Denies any dysuria or hematuria.  HEMATOLOGIC AND LYMPHATIC: No easy bruising. Has history of chronic anemia.  MUSCULOSKELETAL: Denies arthritis or gout.  NEUROLOGIC: Denies focal weakness or numbness. Has global weakness. PSYCHIATRIC: Denies insomnia or anxiety.   PHYSICAL EXAMINATION: VITAL SIGNS: Temperature on arrival 97.5, pulse rate 95, respiratory rate 24, blood pressure 141/72, O2 sats 100% on room air. He does have orthostatic hypotension. Lying flat pressures are 152/89, standing pressures dropped to 125/62.  GENERAL: The patient will developed African American male lying in bed.  HEENT: Normocephalic, atraumatic. Pupils are equal and reactive. Poor dentition. Moist mucous membranes.  NECK: Supple. No thyroid tenderness. No cervical lymphadenopathy.  HEART: S1 and S2 regular. No significant murmurs appreciated.  LUNGS: Clear to auscultation without  wheezing, rhonchi or rales.  ABDOMEN: Soft. No tenderness. No rebound or guarding. Hypoactive.  EXTREMITIES: No pitting edema.  SKIN: The patient does have widespread vitiligo, more so in the upper extremities and face.  NEUROLOGIC: Cranial nerves II through XII  grossly intact. Strength is 5 out of 5 in all extremities. Sensation intact to light touch.  PSYCHIATRIC: Awake, alert, and oriented x3, cooperative.   LABORATORY AND IMAGING: Glucose 80, BUN 124, creatinine 8.74, potassium 5.7, calcium 9.2. LFTs showed AST of 56 and albumin 3.2, otherwise within normal limits. CK-MB 3. CK total 171. White count 9.7, hemoglobin 11.1, platelets 191,000.   EKG: Rate is 84, LVH. There are peaked T waves and some Q waves in lead III.   Chest x-ray, PA and lateral, showed no active cardiopulmonary disease.   ASSESSMENT AND PLAN: We have a pleasant 60 year old with end-stage renal disease on dialysis Monday, Wednesday, and Fridays who did state that he went to dialysis yesterday, comes in for about 3 week history of epigastric pain, 2 to 3 day history dizziness, 1 day history of black, tarry stools with orthostatic hypotension, dizziness, and hyperkalemia, as well as suspected gastrointestinal bleed, possibly upper. At this point, would admit the patient to hospital. In regards to the gastrointestinal bleed, it sounds more like an upper gastrointestinal bleed with black, tarry stools, guaiac-positive, but hemoglobin is stable. I suspect the hemoglobin is going to trend down. Would cycle the hemoglobins, start the patient on Protonix drip. I have discussed the case with Dr. Vira Agar who will see the patient for possible EGD tomorrow. He is not on any significant NSAIDs or aspirin. Although blood pressures are stable, he is orthostatic and does drop his pressures with standing. He is also asymptomatic. I would start him on gentle fluid rehydration as well, and I have explained the risks and benefits of a blood transfusion and I have consented him for a blood transfusion. Would hold his blood pressure medications at this point.   In regards to dizziness, that is secondary to orthostatic hypotension secondary to gastrointestinal bleed. We would start him on some gentle fluids, hold  his blood pressure medications.   In regards to the hyperkalemia, I suspect it is secondary to possible reabsorption of potassium through the breakdown of red blood cells. He does have EKG changes and therefore would need treatment for it. I have ordered albuterol, insulin, D50, calcium, and bicarb. I have also discussed the case with nephrology. We will see if he has been missing dialysis or not, but nonetheless we will see if we can dialyze him today for it as I suspect that by tomorrow it might be higher if it goes untreated, and I cannot give him Kayexalate in the setting of his acute gastrointestinal bleed and dark stools and epigastric pain.   In regards to the hypotension, I will go ahead hold his blood pressure medication and start him on TEDS and SCDs for deep vein thrombosis prophylaxis.   CODE STATUS: The patient is FULL code.   TOTAL TIME SPENT: 50 minutes.    ____________________________ Vivien Presto, MD sa:sb D: 04/26/2013 14:59:17 ET T: 04/26/2013 16:17:23 ET JOB#: [id number]  cc: Vivien Presto, MD, <Dictator> Vivien Presto MD ELECTRONICALLY SIGNED 05/24/2013 15:38

## 2014-06-15 NOTE — Consult Note (Signed)
Pt without GI complaints.  He is passing brown/green stool, no blood, no melena.  No abd pain, no nausea, or vomiting.  Pt wishes to go home.  Hgb 7.2, He is getting dialysis now.  From a GI standpoint he can go home on mechanical soft diet and bid PPI.  Avoid all NSAID prescription or OTC.  After 2 weeks I would start iron pills at a low dose to see if he will tolerate them.  He has such duodenal inflammation that he may require IV iron instead of oral.  Would continue PPI for 8 weeks BID then once a day forever.  Follow up with me if needed, otherwise see primary doctor.  Electronic Signatures: Manya Silvas (MD)  (Signed on 09-Mar-15 13:14)  Authored  Last Updated: 09-Mar-15 13:14 by Manya Silvas (MD)

## 2014-06-15 NOTE — Consult Note (Signed)
Pt with large anterior duodenal ulcer with adherent pigmented material with possiblity of rebleed so I injected 5cc epi 1:10,000 and cauterized twice on usual settings.  Looked good after this.  Other ulcers and signif duodenitis seen but none with stigmata of recent bleeding.  Ok to start water today and clear liquids tomorrow then full liquids for 1-2 days after that then mechanical soft food after that.  When goes home should be on bid PPI and avoid all NSAID.  Dr. Rayann Heman on call this weekend and will see.  Electronic Signatures: Manya Silvas (MD)  (Signed on 06-Mar-15 11:35)  Authored  Last Updated: 06-Mar-15 11:35 by Manya Silvas (MD)

## 2014-06-15 NOTE — Consult Note (Signed)
PATIENT NAME:  [name], [name] MR#:  [id number] DATE OF BIRTH:  11/03/1955  DATE OF CONSULTATION:  04/26/2013  CONSULTING PHYSICIAN:  Manya Silvas, MD  REPORT OF CONSULTATION: The patient is a 59 year old black male who has a history of renal failure and is on hemodialysis. He had previous GI bleed, and was evaluated in September 2014 by Dr. Allen [name]. His hemoglobin dropped to 6.4 at that time. He had an upper endoscopy done and showed hiatal hernia and duodenitis.   The patient presented to the ER today with a history of black stools for a few days and feeling weak. He was evaluated in the ER, and his hemoglobin surprisingly was mid-11 range, so he was seen by Renal and by the hospitalist. He was admitted to the hospital because of a mildly elevated potassium. He was treated with some dialysis, and because his hemoglobin appeared to be reasonable level. However, during dialysis he had a couple of syncopal spells. They resembled seizures, so he received some Ativan. He was admitted to the Intensive Care Unit. I was asked to see him in consultation.   ALLERGIES: SULFA.   MEDICATIONS ON ADMISSION:  1.  Amlodipine 10 mg a day.  2.  Calcium acetate 667 mg 4 tablets 3 times a day. 3.  Calcium carbonate 750 mg chewable 2 tabs every 2 hours as needed for indigestion.  4.  Enalapril 2.5 mg daily. 5.  Renvela 2.4 grams 3 times a day.   FAMILY HISTORY: High blood pressure.   HABITS: Smokes 4 to 5 cigarettes a day. Occasional alcohol.   REVIEW OF SYSTEMS: Per hospitalist note, positive for fatigue, weakness and dizziness. Some shortness of breath. Positive for epigastric pain. Positive for black, tarry stools. Negative for chest pains.   The patient was noted in the ER to have some orthostatic blood pressure changes, blood pressure going from 152/89 lying down to 125/62 standing up   PHYSICAL EXAMINATION: GENERAL: On examination by me, the patient was somewhat lethargic from Ativan he got after his  syncopal spells. He was examined lying in bed in the Intensive Care Unit. Elderly black male who looks older than stated age.  VITAL SIGNS: Blood pressure 122/67, pulse 92, respirations 16, O2 sat 99% on 2 liters.  HEENT: Sclerae nonicteric. Conjunctivae mildly pale. Tongue slightly pale.  SKIN: Warm and dry.  CHEST: Clear.  HEART: Shows no murmurs or gallops I can hear.  ABDOMEN: Nontender. No hepatosplenomegaly. Soft.  EXTREMITIES: No edema.  NEUROLOGIC: The patient was incontinent of dark green stool.    the stool was in the bed, and was noted while the nurses were changing the bed.   LABS: Hemoglobin was 11.1 on admission, and had fallen to 8.5. His platelet count was 191 on admission, white count 9.7. Liver panel normal, except for SGOT 56 and albumin 3.2. Glucose 80, BUN 124, creatinine 8.7, sodium 137, potassium 5.7, chloride 104, CO2 of 20, calcium 9.2 (repeat potassium after dialysis was 4.1. Pro time is 14, INR 1.1.  PAST SURGICAL HISTORY: AV fistula in the left upper extremity.   PAST MEDICAL HISTORY: End stage renal disease, normally dialyzes Monday, Wednesday, Friday; hypertension.   IMPRESSION: Upper gastrointestinal bleed likely, possible gastritis or duodenitis, possible ulcer disease, possible Dieulafoy lesion, possible arteriovenous malformation of the GI tract. Given the darkness of the stool, it is likely an upper GI source, possibly small bowel, possibly right colon. His last upper was in September, his last colonoscopy was several years ago.  ASSESSMENT:  Upper gastrointestinal bleed.  PLAN: Upper endoscopy tomorrow. Transfuse blood tonight. I agree with the plans for transfusion of 2 units tonight. Go ahead and give 2 at one time, rather than give one and check a CBC, because of his syncope and hypotension spells. Will follow with you and plan endoscopy tomorrow. If hemoglobin after 2 units of blood as below 8, he should proceed with a third unit of blood.       ____________________________ Manya Silvas, MD rte:mr D: 04/26/2013 20:31:10 ET T: 04/26/2013 20:53:59 ET JOB#: [id number]  cc: Manya Silvas, MD, <Dictator> Thomas Presto, MD  Manya Silvas MD ELECTRONICALLY SIGNED 05/03/2013 16:42

## 2014-06-15 NOTE — Discharge Summary (Signed)
PATIENT NAME:  Thomas, [name] MR#:  [id number] DATE OF BIRTH:  08-Feb-1956  DATE OF ADMISSION:  04/26/2013 DATE OF DISCHARGE:  04/30/2013  ADMITTING DIAGNOSIS: Gastrointestinal bleed.   DISCHARGE DIAGNOSES:   1.  Acute gastrointestinal bleed from gastric versus duodenal ulcer, status post esophagogastroduodenoscopy on April 27, 2013, by Dr. Vira Agar which revealed gastric as well as duodenal ulcer with clean base.   2.  Acute posthemorrhagic anemia, status post 2 units of packed red blood cell transfusion.  3.  Orthostatic hypotension, resolved.  4.  Hypokalemia due to gastrointestinal bleeding, resolved.   5.  History of seizure disorder.  6. End-stage renal disease, on hemodialysis Mondays, Wednesdays, Fridays.  7.  Cocaine abuse.  8.  Hepatitis C.  9.  Anemia of chronic kidney disease.   10.  Secondary hyperparathyroidism.  11.  Hypertension.  12.  Diabetes mellitus.  13.  Arteriovenous fistula in the left arm.  14.  Diarrhea, likely viral.  PROCEDURES: EGD on April 27, 2013, by Dr. Vira Agar.  DISCHARGE CONDITION: Stable.   DISCHARGE MEDICATIONS:  1.  The patient is to continue amlodipine 10 mg p.o. daily.  2.  Enalapril 2.5 mg p.o. daily.  3.  Renvela 2.4 mg, 1 packet 3 times daily with meals.  4.  Calcium carbonate 750 mg 2 tablets every 2 hours as needed.  5.  Calcium acetate 667 mg tablets 4 tablets 3 times daily with meals.  6.  Protonix 40 mg p.o. twice daily.  7.  Keppra 500 mg p.o. once daily and 250 mg p.o. 3 times weekly on Mondays, Wednesdays, Fridays at bedtime.  8.  Imodium AD 2 mg every 6 hours as needed.   HOME OXYGEN: None.   DIET: Two grams salt, low fat, low cholesterol, hemodialysis diet with 1500 mL fluid restriction. The patient was advised to liberalize his water intake if he has continued diarrhea and then drink more water depending on his needs.   ACTIVITY LIMITATIONS: As tolerated.   FOLLOWUP APPOINTMENT: With [facility] Mondays, Wednesdays, Fridays for  hemodialysis and [facility] in 2 days after discharge.   CONSULTANTS: Care management, social work, Dr. Rayann Heman, Dr. Juleen China, Dr. Vira Agar, Dr. Irish Elders.   RADIOLOGIC STUDIES: Chest x-ray, PA and lateral, April 27, 2013, revealing no active cardiopulmonary disease. CT scan of head without contrast April 27, 2013, revealing atrophy without acute abnormality.   HISTORY OF PRESENT ILLNESS:  The patient is a 59 year old male with past medical history significant for history of end-stage renal disease, who is on hemodialysis, who presents to the hospital with complaints of dizziness. Please refer to Dr. Evie Lacks admission note on April 26, 2013. On arrival to the hospital, the patient's temperature was 97.5, pulse was 95, respiratory rate was 24, blood pressure 141/72, O2 sats were 100% on room air. He did have orthostatic hypotension when his laying blood pressure was 150, standing 125. Physical exam was unremarkable.  The patient's EKG revealed a rate of 84 with LVH, peaked T waves and some Q waves in V3.   LAB DATA DONE ON ADMISSION: BUN and creatinine were 124 and 8.74, potassium 5.7, otherwise BMP was unremarkable. The patient's liver enzymes revealed albumin level of 3.2, AST was elevated at 56. CK total as well as MB fractions were within normal limits. TSH was normal at 0.372. Urine drug screen was positive for cocaine. White blood cell count was 9.7, hemoglobin 11.1, platelet count 191. Coagulation panel was unremarkable. Salicylate level less than 1.7.   HOSPITAL COURSE:  The patient was admitted to the hospital for further evaluation. He was hydrated and he had hemoglobin drop to 8.5 on April 26, 2013. On arrival to the hospital, the patient was complaining of dark tarry stools for the past 4 or 5 days with questionable bright red blood and clots. Digital rectal examination done in the Emergency Room revealed tarry stool. The patient was initiated on Protonix IV and consultation with Dr. Vira Agar was  obtained. Dr. Vira Agar proceeded to take the patient to the procedure room. He underwent upper GI endoscopy on April 27, 2013, which showed normal esophagus, gastric ulcer with clean base, 1 duodenal ulcer which was injected and treated with thermal therapy, 1 duodenal ulcer with clean base as well as duodenitis. Post procedure, the patient was initiated on clear liquid diet and still had intermittent black-looking stool. Because of posthemorrhagic anemia, he was transfused with 2 units of packed red blood cells and even with transfusion the patient's hemoglobin level was again down. After transfusion, the patient's hemoglobin level went up to 9.5, however, with recurrent bloody stools it went down to 7.9 on April 28, 2013. Attending physician felt that the patient was hemodynamically stable and he did not need to be transfused 1 more time and observation was advised.    The patient was noted to have two periods of questionable seizure during hemodialysis on April 26, 2013. Consultation with Dr. Irish Elders, neurologist was obtained.  Dr. Irish Elders recommended Keppra to be initiated at 500 mg p.o. daily dose as well as post hemodialysis additional Keppra dose on hemodialysis days.  Dr. Irish Elders, however, was not really sure if the patient had suffered true seizures; however, with initiation of Keppra, the patient did not have any more recurrences of questionable seizures.   1.  In regards to acute GI bleeding, it was felt that the patient's GI bleeding was likely from the duodenal ulcer which was injected as well as thermally treated.  The patient had transfusion with 2 units of packed red blood cells due to acute posthemorrhagic anemia. His hemoglobin level remained somewhat low on the day of discharge, however, the patient is  now hemodynamically stable as compared to arrival orthostatic hypotension which resolved with transfusion. The patient's dizziness also resolved with transfusion.  2.  In regards to  hyperkalemia, it was felt to do that it was related to GI bleeding and resolved with hemodialysis.  3.  For questionable seizure episode, the patient is to continue [place].  However, since Dr. Irish Elders did not feel that the patient's episodes were true seizures, he did not feel that in fact [place] needs to be continued indefinitely.  He recommended possibly stopping Keppra whenever the patient is more stable.  4.  Regarding end-stage renal disease. The patient is to continue hemodialysis Mondays, Wednesdays and Fridays according to his usual schedule.  Last hemodialysis was on April 30, 2013.  5.  For history of cocaine abuse. The patient was counseled and recommended to stop abusing drugs which could cause him having stroke apart from all unfavorable effects.   5.  For anemia of CKD, secondary hyperparathyroidism, hypertension and diabetes. The patient is to continue his outpatient management. He was felt to be stable to be discharged despite his low hemoglobin level today, on April 30, 2013.   His vital signs on the day of discharge: Temperature was 97.9, pulse 69, respiratory rate was 18, blood pressure 147/83, O2 sats were 98% on room air at rest. The patient's hemoglobin level is 7.2 on  April 30, 2013.  The patient was seen by Dr. Vira Agar, who recommended to follow up with him as outpatient and continue PPIs for 8 weeks twice daily and then once a day forever. He also recommended to discharge the patient on mechanical soft diet and avoid all nonsteroidal anti-inflammatory medications including over-the-counter. After 2 weeks, he recommended to start iron pills at a low dose if he tolerates them, however, the patient did have significant duodenal inflammation so Dr. Vira Agar felt that the patient may require IV iron instead of oral.  Follow up with gastroenterology or primary care depending on convenience.   TIME SPENT: 40 minutes.    ____________________________ Theodoro Grist, MD rv:cs D: 04/30/2013  19:23:00 ET T: 04/30/2013 20:45:16 ET JOB#: [id number]  cc: Princella [facility] Theodoro Grist, MD, <Dictator>     Joee Iovine MD ELECTRONICALLY SIGNED 05/23/2013 22:18

## 2014-06-15 NOTE — Consult Note (Signed)
PATIENT NAME:  [name], [name] MR#:  [id number] DATE OF BIRTH:  08-03-1955  DATE OF CONSULTATION:  04/27/2013  CONSULTING PHYSICIAN:  Leotis Pain, MD  REASON FOR CONSULTATION: Rule out seizure activity.   HISTORY OF PRESENT ILLNESS: This is a pleasant 59 year old gentleman with past medical history of end-stage renal disease on dialysis Monday/Wednesday/Friday, and a history of hypertension, who presented with syncopal event found to have what is suspected to be upper GI bleed. While the patient was in dialysis yesterday, there was a questionable seizure activity where the patient became hypotensive. There was frothy sputum coming out of his mouth. No generalized tonic-clonic activity at this time and, as report, no postictal state.   At that time, the patient was on put on Keppra. He was given 750 mg, started at 500 mg twice a day. The patient is status post 3 units of PRBC transfusion, status post endoscopy this morning, for which was found to have a duodenal ulcer that was cauterized by gastroenterology. No past medical history of seizures.   PAST MEDICAL HISTORY: Includes end-stage renal disease on dialysis Monday/Wednesday/Friday, hypertension, diabetes, currently GI bleed, history of syncope.   ALLERGIES: INCLUDE SULFA.   PAST SURGICAL HISTORY: AV fistula in the left upper extremity for dialysis.   OUTPATIENT MEDICATIONS: Includes amlodipine, calcium carbonate, Renvela.  FAMILY HISTORY: Hypertension.   SOCIAL HISTORY: Daily smoker of about 5 cigarettes a day. Lives with son. Denies any drug or EtOH use.   REVIEW OF SYSTEMS:  CONSTITUTIONAL: Positive for generalized fatigue. No blurred vision. No double vision. No tinnitus. No ear pain.  RESPIRATORY: No shortness of breath. No chest pain, orthopnea.  GASTROINTESTINAL: Complaining of epigastric pain.  GENITOURINARY: Denies any dysuria or hematuria.  HEMATOLOGIC: No easy bruising.  MUSCULOSKELETAL: Denies any arthritis or gout.    IMAGING: CAT scan of the head that was done did not show any acute intracranial abnormality. Significant atrophy bilateral.   PHYSICAL EXAMINATION:  VITAL SIGNS: Include a temperature of 97.9, pulse 88, respirations 17, blood pressure 132/77, pulse oximetry 100 on room air.  NEUROLOGIC: The patient is alert, awake, and oriented to time, place, and location. Able to tell me time, date, and President of the [place]. Able to tell me how many quarters are in $1.50. Attention appears to be intact. Cranial nerve examination: Extraocular movements intact. Pupils 3 mm to 2 mm, reactive symmetrically. Facial sensation intact. Facial and motor is intact. Tongue is midline. Uvula elevates symmetrically. Shoulder shrug intact. Motor strength appears 5/5 bilateral upper and lower extremities. Sensation intact to light touch and temperature. Coordination: Finger-to-nose intact. Gait not assessed.   IMPRESSION: A 59 year old male with end-stage renal disease, hypertension, presenting with upper gastrointestinal bleeds, found to have a duodenal ulcer status post cauterization. Questionable seizure activity while on dialysis.   PLAN: The patient was started on Keppra 500 mg twice day after 750 mg bolus, which I discontinued. The plan is to give him Keppra 500 mg a day and extra 500 mg post dialysis only for acute 7 days for acute seizure prophylaxis. Antiepileptic medication should not be continued past 7 days. No further neurological intervention at this time.   Thank you, it was a pleasure seeing this patient.   ____________________________ Leotis Pain, MD yz:np D: 04/27/2013 13:40:53 ET T: 04/27/2013 15:04:46 ET JOB#: [id number]  cc: Leotis Pain, MD, <Dictator> Leotis Pain MD ELECTRONICALLY SIGNED 05/12/2013 13:33

## 2014-09-20 ENCOUNTER — Other Ambulatory Visit: Payer: Self-pay | Admitting: Nephrology

## 2014-10-03 ENCOUNTER — Ambulatory Visit
Admission: RE | Admit: 2014-10-03 | Discharge: 2014-10-03 | Disposition: A | Discharge status: Home / Self Care | Payer: Medicare Other | Source: Ambulatory Visit | Attending: Vascular Surgery | Admitting: Vascular Surgery

## 2014-10-03 ENCOUNTER — Encounter: Payer: Self-pay | Admitting: *Deleted

## 2014-10-03 ENCOUNTER — Encounter
Admission: RE | Disposition: A | Discharge status: Home / Self Care | Payer: Self-pay | Source: Ambulatory Visit | Attending: Vascular Surgery

## 2014-10-03 DIAGNOSIS — N186 End stage renal disease: Secondary | ICD-10-CM | POA: Insufficient documentation

## 2014-10-03 DIAGNOSIS — Z79899 Other long term (current) drug therapy: Secondary | ICD-10-CM | POA: Diagnosis not present

## 2014-10-03 DIAGNOSIS — Z992 Dependence on renal dialysis: Secondary | ICD-10-CM | POA: Insufficient documentation

## 2014-10-03 DIAGNOSIS — T82858A Stenosis of vascular prosthetic devices, implants and grafts, initial encounter: Secondary | ICD-10-CM | POA: Insufficient documentation

## 2014-10-03 DIAGNOSIS — I120 Hypertensive chronic kidney disease with stage 5 chronic kidney disease or end stage renal disease: Secondary | ICD-10-CM | POA: Diagnosis not present

## 2014-10-03 HISTORY — PX: PERIPHERAL VASCULAR CATHETERIZATION: SHX172C

## 2014-10-03 HISTORY — DX: Essential (primary) hypertension: I10

## 2014-10-03 HISTORY — DX: Peripheral vascular disease, unspecified: I73.9

## 2014-10-03 LAB — POTASSIUM (ARMC VASCULAR LAB ONLY): POTASSIUM (ARMC VASCULAR LAB): 4.5

## 2014-10-03 SURGERY — A/V SHUNTOGRAM/FISTULAGRAM
Anesthesia: Moderate Sedation

## 2014-10-03 MED ORDER — LIDOCAINE-EPINEPHRINE (PF) 1 %-1:200000 IJ SOLN
INTRAMUSCULAR | Status: AC
  Filled 2014-10-03: qty 30

## 2014-10-03 MED ORDER — IOHEXOL 300 MG/ML  SOLN
INTRAMUSCULAR | Status: DC | PRN
  Administered 2014-10-03: 52 mL via INTRAVENOUS

## 2014-10-03 MED ORDER — CEFAZOLIN SODIUM 1-5 GM-% IV SOLN
INTRAVENOUS | Status: AC
  Filled 2014-10-03: qty 50

## 2014-10-03 MED ORDER — HEPARIN SODIUM (PORCINE) 1000 UNIT/ML IJ SOLN
INTRAMUSCULAR | Status: AC
  Filled 2014-10-03: qty 1

## 2014-10-03 MED ORDER — HEPARIN (PORCINE) IN NACL 2-0.9 UNIT/ML-% IJ SOLN
INTRAMUSCULAR | Status: AC
  Filled 2014-10-03: qty 1000

## 2014-10-03 MED ORDER — SODIUM CHLORIDE 0.9 % IV SOLN
INTRAVENOUS | Status: DC
  Administered 2014-10-03 (×2): via INTRAVENOUS

## 2014-10-03 MED ORDER — CEFAZOLIN SODIUM 1-5 GM-% IV SOLN
1.0000 g | Freq: Once | INTRAVENOUS | Status: AC
  Administered 2014-10-03: 1 g via INTRAVENOUS

## 2014-10-03 MED ORDER — MIDAZOLAM HCL 2 MG/2ML IJ SOLN
INTRAMUSCULAR | Status: DC | PRN
Start: 2014-10-03 — End: 2014-10-03
  Administered 2014-10-03 (×2): 2 mg via INTRAVENOUS

## 2014-10-03 MED ORDER — FENTANYL CITRATE (PF) 100 MCG/2ML IJ SOLN
INTRAMUSCULAR | Status: DC | PRN
Start: 2014-10-03 — End: 2014-10-03
  Administered 2014-10-03 (×2): 50 ug via INTRAVENOUS

## 2014-10-03 MED ORDER — MIDAZOLAM HCL 5 MG/5ML IJ SOLN
INTRAMUSCULAR | Status: AC
  Filled 2014-10-03: qty 5

## 2014-10-03 MED ORDER — FENTANYL CITRATE (PF) 100 MCG/2ML IJ SOLN
INTRAMUSCULAR | Status: AC
  Filled 2014-10-03: qty 2

## 2014-10-03 MED ORDER — HEPARIN SODIUM (PORCINE) 1000 UNIT/ML IJ SOLN
INTRAMUSCULAR | Status: DC | PRN
Start: 2014-10-03 — End: 2014-10-03
  Administered 2014-10-03: 3000 [IU] via INTRAVENOUS

## 2014-10-03 SURGICAL SUPPLY — 13 items
BALLOON ARMADA 5.0X60X90 (BALLOONS) ×4 IMPLANT
CATH KUMPE (CATHETERS) ×2
CATH SLIP 5FR 0.38 X 40 KMP (CATHETERS) ×2 IMPLANT
DEVICE INFLATION 20/30 (MISCELLANEOUS) ×4 IMPLANT
DRAPE BRACHIAL (DRAPES) ×4 IMPLANT
GLIDEWIRE .035X150 LONG TAPER (WIRE) ×4 IMPLANT
GLIDEWIRE STIFF .35X180X3 HYDR (WIRE) ×4 IMPLANT
GUIDEWIRE PFTE-COATED .018X300 (WIRE) ×4 IMPLANT
KIT 5FR STIFF NT/TG (MISCELLANEOUS) ×4 IMPLANT
PACK ANGIOGRAPHY (CUSTOM PROCEDURE TRAY) ×4 IMPLANT
SHEATH BRITE TIP 6FRX5.5 (SHEATH) ×4 IMPLANT
SUT MON AB 4-0 PC3 18 (SUTURE) ×4 IMPLANT
TOWEL OR 17X26 4PK STRL BLUE (TOWEL DISPOSABLE) ×4 IMPLANT

## 2014-10-03 NOTE — CV Procedure (Signed)
VEIN AND VASCULAR SURGERY    OPERATIVE NOTE   PROCEDURE: 1.   Left radiocephalic arteriovenous fistula cannulation under ultrasound guidance 2.   left arm fistulagram including central venogram 3.   PTA of median antecubital vein with 5 mm angioplasty balloon  PRE-OPERATIVE DIAGNOSIS: 1. ESRD 2. 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  SPECIMEN(S):  None  CONTRAST: 52 cc  INDICATIONS: [name] is a 59 y.o. male who presents with malfunctioning  left radiocephalic arteriovenous fistula.  The patient is scheduled for  left arm fistulagram.  The patient is aware the risks include but are not limited to: bleeding, infection, thrombosis of the cannulated access, and possible anaphylactic reaction to the contrast.  The patient is aware of the risks of the procedure and elects to proceed forward.  DESCRIPTION: After full informed written consent was obtained, the patient was brought back to the angiography suite and placed supine upon the angiography table.  The patient was connected to monitoring equipment.  The  left arm was prepped and draped in the standard fashion for a percutaneous access intervention.  Under ultrasound guidance, the  arterial access site of the left radiocephalic arteriovenous fistula was cannulated with a micropuncture needle under direct ultrasound guidance and a permanent image was performed.  The microwire was advanced into the fistula and the needle was exchanged for the a microsheath.   I then upsized to a 6 Fr Sheath and imaging was performed.  Hand injections were completed to image the access including the central venous system. This demonstrated drainage through stenotic median antecubital vein and multiple collaterals at the antecubital fossa with upper arm outflow through the basilic and brachial veins. In addition, there is a significant narrowing of the left innominate vein in the 80-90% range. The degree of stenosis in the median antecubital vein is difficult to discern, but appeared to be greater than 75%. I then gave the patient 3000 units of intravenous heparin.  I then crossed the stenosis with a 0.018 advantage wire. Due to the marked tortuosity of the lesion and the flow of blood, a larger wire would not navigated easily. I also had to use a Kumpe catheter to help traverse this area.  Based on the imaging, a 5 mm x 6 cm  conventional angioplasty balloon was selected.  The balloon was centered around the median antecubital vein stenosis and inflated to 14 ATM for 1 minute(s).  On completion imaging, a 20-30 % residual stenosis was present.   Due to the marked tortuosity and stored energy, treatment of the innominate lesion was not really feasible today. Given his configuration, if he still has flow issues he may need a surgical jump graft revision of his fistula and the innominate lesion could be treated after this surgical revision is performed. The wire and balloon were removed from the sheath.  A 4-0 Monocryl purse-string suture was sewn around the sheath.  The sheath was removed while tying down the suture.  A sterile bandage was applied to the puncture site.  COMPLICATIONS: None  CONDITION: Stable   DEW,JASON  10/03/2014 10:41 AM

## 2014-10-03 NOTE — H&P (Signed)
[facility] History & Physical Update  The patient was interviewed and re-examined.  The patient's previous History and Physical has been reviewed and is unchanged.  There is no change in the plan of care. We plan to proceed with the scheduled procedure.  DEW,JASON, MD  10/03/2014, 9:41 AM

## 2014-10-03 NOTE — Discharge Instructions (Signed)

## 2014-10-03 NOTE — H&P (Signed)
[facility] Admission History & Physical  MRN : [id number]  [name] is a 59 y.o. (1956/01/23) male who presents with chief complaint of No chief complaint on file. [name]  History of Present Illness: Patient sent from dialysis center for decreased flow of AVF.  No other complaints and is in his usual state of health.  Gets HD M/W/F.  Current Facility-Administered Medications  Medication Dose Route Frequency Provider Last Rate Last Dose  . 0.9 %  sodium chloride infusion   Intravenous Continuous Algernon Huxley, MD 20 mL/hr at 10/03/14 0746    . ceFAZolin (ANCEF) IVPB 1 g/50 mL premix  1 g Intravenous Once Algernon Huxley, MD        Past Medical History  Diagnosis Date  . Hypertension   . Peripheral vascular disease   . Chronic kidney disease     Past Surgical History  Procedure Laterality Date  . Av fistula placement Left 2014    Social History Social History  Substance Use Topics  . Smoking status: Current Every Day Smoker -- 1.50 packs/day for 40 years    Types: Cigarettes  . Smokeless tobacco: Never Used  . Alcohol Use: No  No IV drug use  Family History No bleeding or clotting disorders, no autoimmune diseases, no porphyrias  Allergies  Allergen Reactions  . Sulfa Antibiotics Rash     REVIEW OF SYSTEMS (Negative unless checked)  Constitutional: [] Weight loss  [] Fever  [] Chills Cardiac: [] Chest pain   [] Chest pressure   [] Palpitations   [] Shortness of breath when laying flat   [] Shortness of breath at rest   [] Shortness of breath with exertion. Vascular:  [] Pain in legs with walking   [] Pain in legs at rest   [] Pain in legs when laying flat   [] Claudication   [] Pain in feet when walking  [] Pain in feet at rest  [] Pain in feet when laying flat   [] History of DVT   [] Phlebitis   [] Swelling in legs   [] Varicose veins   [] Non-healing ulcers Pulmonary:   [] Uses home oxygen   [] Productive cough   [] Hemoptysis   [] Wheeze  [] COPD   [] Asthma Neurologic:   [] Dizziness  [] Blackouts   [] Seizures   [] History of stroke   [] History of TIA  [] Aphasia   [] Temporary blindness   [] Dysphagia   [] Weakness or numbness in arms   [] Weakness or numbness in legs Musculoskeletal:  [] Arthritis   [] Joint swelling   [] Joint pain   [] Low back pain Hematologic:  [] Easy bruising  [] Easy bleeding   [] Hypercoagulable state   [] Anemic  [] Hepatitis Gastrointestinal:  [] Blood in stool   [] Vomiting blood  [] Gastroesophageal reflux/heartburn   [] Difficulty swallowing. Genitourinary:  [x] Chronic kidney disease   [] Difficult urination  [] Frequent urination  [] Burning with urination   [] Blood in urine Skin:  [] Rashes   [] Ulcers   [] Wounds Psychological:  [] History of anxiety   []  History of major depression.  Physical Examination  Filed Vitals:   10/03/14 0742  BP: 190/107  Temp: 98.1 F (36.7 C)  TempSrc: Oral  Resp: 22  SpO2: 98%   There is no height or weight on file to calculate BMI. Gen: WD/WN, NAD Head: [place]/AT, No temporalis wasting. Prominent temp pulse not noted. Ear/Nose/Throat: Hearing grossly intact, nares w/o erythema or drainage, oropharynx w/o Erythema/Exudate,  Eyes: PERRLA, EOMI.  Neck: Supple, no nuchal rigidity.  No bruit or JVD.  Pulmonary:  Good air movement, clear to auscultation bilaterally, no use of accessory muscles.  Cardiac: RRR, normal S1, S2, no Murmurs, rubs or gallops. Vascular: thrill and bruit present in left radiocephalic AVF Vessel Right Left  Radial Palpable Palpable                                   Gastrointestinal: soft, non-tender/non-distended. No guarding/reflex.  Musculoskeletal: M/S 5/5 throughout.  Extremities without ischemic changes.  No deformity or atrophy.  Neurologic: CN 2-12 intact. Pain and light touch intact in extremities.  Symmetrical.  Speech is fluent. Motor exam as listed above. Psychiatric: Judgment intact, Mood & affect appropriate for pt's clinical situation. Dermatologic: No rashes or ulcers  noted.  No cellulitis or open wounds.  Vittiligo present Lymph : No Cervical, Axillary, or Inguinal lymphadenopathy.     CBC Lab Results  Component Value Date   WBC 11.5* 09/04/2013   HGB 9.8* 09/04/2013   HCT 31.2* 09/04/2013   MCV 91 09/04/2013   PLT 203 09/04/2013    BMET    Component Value Date/Time   NA 139 04/30/2013 1203   K 4.4 04/30/2013 1203   CL 107 04/30/2013 1203   CO2 25 04/30/2013 1203   GLUCOSE 104* 04/30/2013 1203   BUN 76* 04/30/2013 1203   CREATININE 9.49* 04/30/2013 1203   CALCIUM 8.0* 04/30/2013 1203   GFRNONAA 5* 04/30/2013 1203   GFRAA 6* 04/30/2013 1203   CrCl cannot be calculated (Unknown ideal weight.).  COAG Lab Results  Component Value Date   INR 1.1 04/26/2013   INR 1.0 07/19/2012   INR 1.2 12/12/2011      Assessment/Plan 1. Dysfunction of dialysis access device:  Decreased flow left arm AVF.  For fistulagram today. 2. ESRD on HD M/W/F.  Treat fistula to improve function.   DEW,JASON, MD  10/03/2014 9:55 AM

## 2014-10-08 ENCOUNTER — Encounter: Payer: Self-pay | Admitting: Vascular Surgery

## 2014-11-10 ENCOUNTER — Inpatient Hospital Stay
Admission: EM | Admit: 2014-11-10 | Discharge: 2014-11-12 | DRG: 291 | Disposition: A | Discharge status: Home / Self Care | Payer: Medicare Other | Attending: Internal Medicine | Admitting: Internal Medicine

## 2014-11-10 ENCOUNTER — Encounter: Payer: Self-pay | Admitting: Internal Medicine

## 2014-11-10 ENCOUNTER — Emergency Department: Payer: Medicare Other

## 2014-11-10 DIAGNOSIS — I5033 Acute on chronic diastolic (congestive) heart failure: Principal | ICD-10-CM | POA: Diagnosis present

## 2014-11-10 DIAGNOSIS — R0902 Hypoxemia: Secondary | ICD-10-CM | POA: Diagnosis present

## 2014-11-10 DIAGNOSIS — I739 Peripheral vascular disease, unspecified: Secondary | ICD-10-CM | POA: Diagnosis present

## 2014-11-10 DIAGNOSIS — F17210 Nicotine dependence, cigarettes, uncomplicated: Secondary | ICD-10-CM | POA: Diagnosis present

## 2014-11-10 DIAGNOSIS — Z79899 Other long term (current) drug therapy: Secondary | ICD-10-CM

## 2014-11-10 DIAGNOSIS — Z992 Dependence on renal dialysis: Secondary | ICD-10-CM | POA: Diagnosis not present

## 2014-11-10 DIAGNOSIS — I120 Hypertensive chronic kidney disease with stage 5 chronic kidney disease or end stage renal disease: Secondary | ICD-10-CM | POA: Diagnosis present

## 2014-11-10 DIAGNOSIS — Z882 Allergy status to sulfonamides status: Secondary | ICD-10-CM

## 2014-11-10 DIAGNOSIS — I351 Nonrheumatic aortic (valve) insufficiency: Secondary | ICD-10-CM | POA: Diagnosis not present

## 2014-11-10 DIAGNOSIS — N186 End stage renal disease: Secondary | ICD-10-CM | POA: Diagnosis present

## 2014-11-10 DIAGNOSIS — N2581 Secondary hyperparathyroidism of renal origin: Secondary | ICD-10-CM | POA: Diagnosis present

## 2014-11-10 DIAGNOSIS — E875 Hyperkalemia: Secondary | ICD-10-CM | POA: Diagnosis present

## 2014-11-10 DIAGNOSIS — J811 Chronic pulmonary edema: Secondary | ICD-10-CM

## 2014-11-10 DIAGNOSIS — Z9111 Patient's noncompliance with dietary regimen: Secondary | ICD-10-CM | POA: Diagnosis present

## 2014-11-10 DIAGNOSIS — D631 Anemia in chronic kidney disease: Secondary | ICD-10-CM | POA: Diagnosis present

## 2014-11-10 DIAGNOSIS — Z8249 Family history of ischemic heart disease and other diseases of the circulatory system: Secondary | ICD-10-CM

## 2014-11-10 DIAGNOSIS — Z9889 Other specified postprocedural states: Secondary | ICD-10-CM | POA: Diagnosis not present

## 2014-11-10 DIAGNOSIS — Z9119 Patient's noncompliance with other medical treatment and regimen: Secondary | ICD-10-CM | POA: Diagnosis present

## 2014-11-10 DIAGNOSIS — I5042 Chronic combined systolic (congestive) and diastolic (congestive) heart failure: Secondary | ICD-10-CM

## 2014-11-10 DIAGNOSIS — N19 Unspecified kidney failure: Secondary | ICD-10-CM

## 2014-11-10 HISTORY — DX: End stage renal disease: N18.6

## 2014-11-10 LAB — CBC WITH DIFFERENTIAL/PLATELET
Basophils Absolute: 0.1 10*3/uL (ref 0–0.1)
Basophils Relative: 1 %
Eosinophils Absolute: 0.1 10*3/uL (ref 0–0.7)
Eosinophils Relative: 2 %
HEMATOCRIT: 33.8 % — AB (ref 40.0–52.0)
HEMOGLOBIN: 11 g/dL — AB (ref 13.0–18.0)
LYMPHS ABS: 0.9 10*3/uL — AB (ref 1.0–3.6)
Lymphocytes Relative: 15 %
MCH: 30.6 pg (ref 26.0–34.0)
MCHC: 32.7 g/dL (ref 32.0–36.0)
MCV: 93.7 fL (ref 80.0–100.0)
MONO ABS: 0.6 10*3/uL (ref 0.2–1.0)
MONOS PCT: 9 %
NEUTROS ABS: 4.7 10*3/uL (ref 1.4–6.5)
NEUTROS PCT: 73 %
Platelets: 226 10*3/uL (ref 150–440)
RBC: 3.61 MIL/uL — ABNORMAL LOW (ref 4.40–5.90)
RDW: 16.1 % — AB (ref 11.5–14.5)
WBC: 6.4 10*3/uL (ref 3.8–10.6)

## 2014-11-10 LAB — COMPREHENSIVE METABOLIC PANEL
ALK PHOS: 105 U/L (ref 38–126)
ALT: 23 U/L (ref 17–63)
ANION GAP: 15 (ref 5–15)
AST: 30 U/L (ref 15–41)
Albumin: 3.9 g/dL (ref 3.5–5.0)
BILIRUBIN TOTAL: 0.5 mg/dL (ref 0.3–1.2)
BUN: 55 mg/dL — ABNORMAL HIGH (ref 6–20)
CALCIUM: 8.4 mg/dL — AB (ref 8.9–10.3)
CO2: 23 mmol/L (ref 22–32)
Chloride: 102 mmol/L (ref 101–111)
Creatinine, Ser: 10.97 mg/dL — ABNORMAL HIGH (ref 0.61–1.24)
GFR, EST AFRICAN AMERICAN: 5 mL/min — AB (ref >60)
GFR, EST NON AFRICAN AMERICAN: 4 mL/min — AB (ref >60)
Glucose, Bld: 120 mg/dL — ABNORMAL HIGH (ref 65–99)
Potassium: 4.4 mmol/L (ref 3.5–5.1)
Sodium: 140 mmol/L (ref 135–145)
TOTAL PROTEIN: 7.6 g/dL (ref 6.5–8.1)

## 2014-11-10 MED ORDER — LABETALOL HCL 5 MG/ML IV SOLN
10.0000 mg | INTRAVENOUS | Status: DC | PRN
  Administered 2014-11-10: 10 mg via INTRAVENOUS
  Filled 2014-11-10 (×2): qty 4

## 2014-11-10 MED ORDER — FUROSEMIDE 10 MG/ML IJ SOLN
40.0000 mg | Freq: Two times a day (BID) | INTRAMUSCULAR | Status: DC
  Administered 2014-11-11 – 2014-11-12 (×2): 40 mg via INTRAVENOUS
  Filled 2014-11-10 (×2): qty 4

## 2014-11-10 MED ORDER — ONDANSETRON HCL 4 MG/2ML IJ SOLN: 4.0000 mg | Freq: Once | INTRAMUSCULAR | Status: DC

## 2014-11-10 MED ORDER — CALCIUM ACETATE (PHOS BINDER) 667 MG/5ML PO SOLN
667.0000 mg | Freq: Three times a day (TID) | ORAL | Status: DC
  Administered 2014-11-12: 667 mg via ORAL
  Filled 2014-11-10 (×16): qty 5

## 2014-11-10 MED ORDER — ENALAPRIL MALEATE 10 MG PO TABS
10.0000 mg | ORAL_TABLET | Freq: Every day | ORAL | Status: DC
  Administered 2014-11-11: 10 mg via ORAL
  Filled 2014-11-10: qty 1

## 2014-11-10 MED ORDER — SODIUM CHLORIDE 0.9 % IJ SOLN
3.0000 mL | Freq: Two times a day (BID) | INTRAMUSCULAR | Status: DC
  Administered 2014-11-10 – 2014-11-12 (×3): 3 mL via INTRAVENOUS

## 2014-11-10 MED ORDER — CINACALCET HCL 30 MG PO TABS
90.0000 mg | ORAL_TABLET | Freq: Every day | ORAL | Status: DC
  Administered 2014-11-12: 90 mg via ORAL
  Filled 2014-11-10: qty 3

## 2014-11-10 MED ORDER — HYDRALAZINE HCL 20 MG/ML IJ SOLN
10.0000 mg | Freq: Four times a day (QID) | INTRAMUSCULAR | Status: DC | PRN
  Administered 2014-11-10 – 2014-11-11 (×2): 10 mg via INTRAVENOUS
  Filled 2014-11-10: qty 1

## 2014-11-10 MED ORDER — ACETAMINOPHEN 650 MG RE SUPP
650.0000 mg | Freq: Four times a day (QID) | RECTAL | Status: DC | PRN
Start: 2014-11-10 — End: 2014-11-12

## 2014-11-10 MED ORDER — ACETAMINOPHEN 325 MG PO TABS
650.0000 mg | ORAL_TABLET | Freq: Four times a day (QID) | ORAL | Status: DC | PRN
  Administered 2014-11-11: 650 mg via ORAL
  Filled 2014-11-10: qty 2

## 2014-11-10 MED ORDER — HYDROMORPHONE HCL 1 MG/ML IJ SOLN: 2.0000 mg | Freq: Once | INTRAMUSCULAR | Status: DC

## 2014-11-10 MED ORDER — HYDRALAZINE HCL 20 MG/ML IJ SOLN
INTRAMUSCULAR | Status: AC
  Administered 2014-11-10: 10 mg via INTRAVENOUS
  Filled 2014-11-10: qty 1

## 2014-11-10 MED ORDER — HYDRALAZINE HCL 25 MG PO TABS
25.0000 mg | ORAL_TABLET | Freq: Three times a day (TID) | ORAL | Status: DC
  Administered 2014-11-10: 25 mg via ORAL
  Filled 2014-11-10: qty 1

## 2014-11-10 MED ORDER — LABETALOL HCL 5 MG/ML IV SOLN
10.0000 mg | Freq: Once | INTRAVENOUS | Status: AC
  Administered 2014-11-10: 10 mg via INTRAVENOUS

## 2014-11-10 MED ORDER — FUROSEMIDE 10 MG/ML IJ SOLN
80.0000 mg | Freq: Once | INTRAMUSCULAR | Status: AC
  Administered 2014-11-10: 80 mg via INTRAVENOUS
  Filled 2014-11-10: qty 8

## 2014-11-10 MED ORDER — AMLODIPINE BESYLATE 10 MG PO TABS
10.0000 mg | ORAL_TABLET | Freq: Every day | ORAL | Status: DC
  Administered 2014-11-11 – 2014-11-12 (×2): 10 mg via ORAL
  Filled 2014-11-10 (×2): qty 1

## 2014-11-10 MED ORDER — HEPARIN SODIUM (PORCINE) 5000 UNIT/ML IJ SOLN
5000.0000 [IU] | Freq: Three times a day (TID) | INTRAMUSCULAR | Status: DC
  Administered 2014-11-10 – 2014-11-12 (×4): 5000 [IU] via SUBCUTANEOUS
  Filled 2014-11-10 (×4): qty 1

## 2014-11-10 NOTE — ED Notes (Signed)
[id number] Loffler, RN called and talked with Jannifer Franklin about patients BP that remains elevated.  Per Dr Jannifer Franklin pt may go to floor after 2nd dose of labetolol administered,

## 2014-11-10 NOTE — ED Notes (Signed)
Update report called to Lattie Haw, Therapist, sports.   Pt to be transported to 241 in stable condition on oxygen and cardiac monitor

## 2014-11-10 NOTE — H&P (Signed)
[facility] NAME: [name]    MR#:  [id number]  DATE OF BIRTH:  1955-09-22  DATE OF ADMISSION:  11/10/2014  PRIMARY CARE PHYSICIAN: [facility]  REQUESTING/REFERRING PHYSICIAN: Dr. Marcelene Butte  CHIEF COMPLAINT:   Chief Complaint  Patient presents with  . Shortness of Breath    HISTORY OF PRESENT ILLNESS:  [name]  is a 59 y.o. male with a known history of end-stage renal disease on Monday Wednesday Friday hemodialysis, hypertension presents to the hospital secondary to worsening breathing. Patient states each time after dialysis his breathing gets better and gets worsened in a day or 2. He seems like he does not follow a strict low sodium diet and also strict fluid restriction. May be due to his noncompliance he accumulates fluid faster. He said he felt better after his Friday dialysis, felt fine yesterday. This morning he started feeling short of breath that got worse towards the end of the day. He was hypoxic in the emergency room, now on 2 L oxygen. He is coughing pinkish froathy sputum. And still makes some urine, so given a dose of Lasix. Nephrology has been notified. So patient is being admitted for pulmonary edema as noted on his chest x-ray.  PAST MEDICAL HISTORY:   Past Medical History  Diagnosis Date  . Hypertension   . Peripheral vascular disease   . ESRD (end stage renal disease)     PAST SURGICAL HISTORY:   Past Surgical History  Procedure Laterality Date  . Av fistula placement Left 2014  . Peripheral vascular catheterization N/A 10/03/2014    Procedure: A/V Shuntogram/Fistulagram;  Surgeon: Algernon Huxley, MD;  Location: [facility];  Service: Cardiovascular;  Laterality: N/A;  . Peripheral vascular catheterization Left 10/03/2014    Procedure: A/V Shunt Intervention;  Surgeon: Algernon Huxley, MD;  Location: [facility];  Service: Cardiovascular;  Laterality: Left;    SOCIAL  HISTORY:   Social History  Substance Use Topics  . Smoking status: Current Every Day Smoker -- 0.50 packs/day for 40 years    Types: Cigarettes  . Smokeless tobacco: Never Used  . Alcohol Use: No    FAMILY HISTORY:   Family History  Problem Relation Age of Onset  . Hypertension Son     DRUG ALLERGIES:   Allergies  Allergen Reactions  . Sulfa Antibiotics Rash    REVIEW OF SYSTEMS:   Review of Systems  Constitutional: Negative for fever, chills, weight loss and malaise/fatigue.  HENT: Negative for ear discharge, ear pain, hearing loss, nosebleeds and tinnitus.   Eyes: Negative for blurred vision, double vision and photophobia.  Respiratory: Positive for cough and shortness of breath. Negative for hemoptysis and wheezing.   Cardiovascular: Negative for chest pain, palpitations, orthopnea and leg swelling.  Gastrointestinal: Negative for heartburn, nausea, vomiting, abdominal pain, diarrhea, constipation and melena.  Genitourinary: Negative for dysuria, urgency, frequency and hematuria.  Musculoskeletal: Negative for myalgias, back pain and neck pain.  Skin: Negative for rash.  Neurological: Positive for weakness. Negative for dizziness, tingling, tremors, sensory change, speech change, focal weakness and headaches.  Endo/Heme/Allergies: Does not bruise/bleed easily.  Psychiatric/Behavioral: Negative for depression.    MEDICATIONS AT HOME:   Prior to Admission medications   Medication Sig Start Date End Date Taking? Authorizing Provider  amLODipine (NORVASC) 10 MG tablet Take 10 mg by mouth daily.   Yes Historical Provider, MD  cinacalcet (SENSIPAR) 90 MG tablet Take  90 mg by mouth daily.   Yes Historical Provider, MD  enalapril (VASOTEC) 10 MG tablet Take 10 mg by mouth daily.   Yes Historical Provider, MD  hydrALAZINE (APRESOLINE) 25 MG tablet Take 25 mg by mouth 3 (three) times daily. 10/23/14  Yes Historical Provider, MD  PHOSLYRA 667 MG/5ML SOLN Take 5 mLs by mouth 3  (three) times daily with meals. 10/17/14  Yes Historical Provider, MD      VITAL SIGNS:  Blood pressure 188/121, pulse 90, temperature 98.3 F (36.8 C), temperature source Oral, resp. rate 24, height 5\' 9"  (1.753 m), weight 81.92 kg (180 lb 9.6 oz), SpO2 95 %.  PHYSICAL EXAMINATION:   Physical Exam  GENERAL:  59 y.o.-year-old patient lying in the bed with no acute distress.  EYES: Pupils equal, round, reactive to light and accommodation. No scleral icterus. Extraocular muscles intact.  HEENT: Head atraumatic, normocephalic. Oropharynx and nasopharynx clear.  NECK:  Supple, no jugular venous distention. No thyroid enlargement, no tenderness.  LUNGS: Normal breath sounds bilaterally, no wheezing,rhonchi . No use of accessory muscles of respiration. Coarse bibasilar crackles heard on exam. CARDIOVASCULAR: S1, S2 normal. No rubs, or gallops. 3/6 systolic murmur present ABDOMEN: Soft, nontender, nondistended. Bowel sounds present. No organomegaly or mass.  EXTREMITIES: 1+ pedal edema, no cyanosis, or clubbing.  NEUROLOGIC: Cranial nerves II through XII are intact. Muscle strength 5/5 in all extremities. Sensation intact. Gait not checked.  PSYCHIATRIC: The patient is alert and oriented x 3.  SKIN: No obvious rash, lesion, or ulcer.   LABORATORY PANEL:   CBC  Recent Labs Lab 11/10/14 1641  WBC 6.4  HGB 11.0*  HCT 33.8*  PLT 226   ------------------------------------------------------------------------------------------------------------------  Chemistries   Recent Labs Lab 11/10/14 1641  NA 140  K 4.4  CL 102  CO2 23  GLUCOSE 120*  BUN 55*  CREATININE 10.97*  CALCIUM 8.4*  AST 30  ALT 23  ALKPHOS 105  BILITOT 0.5   ------------------------------------------------------------------------------------------------------------------  Cardiac Enzymes No results for input(s): TROPONINI in the last 168  hours. ------------------------------------------------------------------------------------------------------------------  RADIOLOGY:  Dg Chest 2 View  11/10/2014   CLINICAL DATA:  59 year old male with a history of shortness of breath.  EXAM: CHEST - 2 VIEW  COMPARISON:  04/26/2013  FINDINGS: Cardiomediastinal silhouette projects slightly larger than the comparison plain film. Atherosclerotic calcifications of the aortic arch.  Fullness in the central vasculature.  Interstitial opacities extending from bilateral hilum, with interlobular septal thickening in the periphery of the lungs, particularly at the lung bases.  No pneumothorax.  No pleural effusion.  No displaced fracture.  Unremarkable appearance of the upper abdomen.  IMPRESSION: Evidence of developing CHF/edema.  Atherosclerosis.  Signed,  Dulcy Fanny. Earleen Newport, DO  [facility]  [facility]   Electronically Signed   By: Corrie Mckusick D.O.   On: 11/10/2014 16:51    EKG:   Orders placed or performed during the hospital encounter of 11/10/14  . ED EKG  . ED EKG  . EKG 12-Lead  . EKG 12-Lead    IMPRESSION AND PLAN:   Axten Jardon  is a 58 y.o. male with a known history of end-stage renal disease on Monday Wednesday Friday hemodialysis, hypertension presents to the hospital secondary to worsening breathing.  #1 acute pulmonary edema-likely dietary noncompliance. -Likely acute on chronic diastolic CHF exacerbation. No previous echo. -Start IV Lasix twice a day. -Nephrology consult, will need dialysis. -continue oxygen support at this time. Patient is not on home  oxygen.  #2 end-stage renal disease on Monday Wednesday Friday hemodialysis-nephrology has been consulted -Likely dialysis tomorrow. -Continue his phosphate binders  #3 malignant hypertension-continue home medications of oral Norvasc, enalapril and hydralazine. -IV hydralazine when necessary  #4 anemia of chronic  disease-stable  #5 DVT prophylaxis-subcutaneous heparin  All the records are reviewed and case discussed with ED provider. Management plans discussed with the patient, family and they are in agreement.  CODE STATUS: Full code  TOTAL TIME TAKING CARE OF THIS PATIENT: 50 minutes.    Gladstone Lighter M.D on 11/10/2014 at 8:14 PM  Between 7am to 6pm - Pager - [phone]  After 6pm go to [email or web address] - password [email or web address] [place] [facility]  Office  [phone]  CC: Primary care physician; Pcp Not In System

## 2014-11-10 NOTE — ED Notes (Addendum)
Pt c/o difficulty breathing, onset last night.   He states he was unable to breath last night while lying down so he did not sleep much,  Dialysis patient. Dialyses on Monday, Wednesday, and Friday- states he had full dialysis on Friday.  Pt is in NAD, skin warm and dry in triage.

## 2014-11-10 NOTE — ED Notes (Signed)
Called report to [place], Therapist, sports.  pts BP remains elevated.   There was a sign and held order for hydralazine to be given, with BP re-eval.  prior to transport

## 2014-11-10 NOTE — ED Notes (Signed)
BP remains elevated after Hydralizine,   Paged hospitalist and additional orders given.

## 2014-11-10 NOTE — ED Provider Notes (Signed)
Time Seen: Approximately ----------------------------------------- 5:07 PM on 11/10/2014 -----------------------------------------   I have reviewed the triage notes  Chief Complaint: Shortness of Breath   History of Present Illness: [name] is a 59 y.o. male who presents with increased shortness of breath starting last night. Patient receives dialysis Monday, Wednesday, Friday. He states he had a full dialysis treatment on Friday. Shortness of breath started last evening and has trouble lying flat. He states he does put out some urine. Patient denies any fever, chills, productive cough or wheezing. He denies any new peripheral edema. He has a functioning left-sided A/V shunt . He denies any abdominal pain or swallowing or any back or flank discomfort. He denies any anterior chest pain.   Past Medical History  Diagnosis Date  . Hypertension   . Peripheral vascular disease   . Chronic kidney disease     There are no active problems to display for this patient.   Past Surgical History  Procedure Laterality Date  . Av fistula placement Left 2014  . Peripheral vascular catheterization N/A 10/03/2014    Procedure: A/V Shuntogram/Fistulagram;  Surgeon: Algernon Huxley, MD;  Location: [facility];  Service: Cardiovascular;  Laterality: N/A;  . Peripheral vascular catheterization Left 10/03/2014    Procedure: A/V Shunt Intervention;  Surgeon: Algernon Huxley, MD;  Location: [facility];  Service: Cardiovascular;  Laterality: Left;    Past Surgical History  Procedure Laterality Date  . Av fistula placement Left 2014  . Peripheral vascular catheterization N/A 10/03/2014    Procedure: A/V Shuntogram/Fistulagram;  Surgeon: Algernon Huxley, MD;  Location: [facility];  Service: Cardiovascular;  Laterality: N/A;  . Peripheral vascular catheterization Left 10/03/2014    Procedure: A/V Shunt Intervention;  Surgeon: Algernon Huxley, MD;  Location: [facility];  Service:  Cardiovascular;  Laterality: Left;    Current Outpatient Rx  Name  Route  Sig  Dispense  Refill  . amLODipine (NORVASC) 10 MG tablet   Oral   Take 10 mg by mouth daily.         . calcium acetate (PHOSLO) 667 MG capsule   Oral   Take by mouth 3 (three) times daily with meals.         . cinacalcet (SENSIPAR) 90 MG tablet   Oral   Take 90 mg by mouth daily.         . enalapril (VASOTEC) 10 MG tablet   Oral   Take 10 mg by mouth daily.         . hydrALAZINE (APRESOLINE) 25 MG tablet   Oral   Take 25 mg by mouth 3 (three) times daily.         [name] loratadine (CLARITIN) 10 MG tablet   Oral   Take 10 mg by mouth daily.           Allergies:  Sulfa antibiotics  Family History: No family history on file.  Social History: Social History  Substance Use Topics  . Smoking status: Current Every Day Smoker -- 1.50 packs/day for 40 years    Types: Cigarettes  . Smokeless tobacco: Never Used  . Alcohol Use: No     Review of Systems:   10 point review of systems was performed and was otherwise negative:  Constitutional: No fever Eyes: No visual disturbances ENT: No sore throat, ear pain Cardiac: No chest pain Respiratory: No shortness of breath, wheezing, or stridor Abdomen: No abdominal pain, no vomiting,  No diarrhea Endocrine: No weight loss, No night sweats Extremities: No peripheral edema, cyanosis Skin: No rashes, easy bruising Neurologic: No focal weakness, trouble with speech or swollowing Urologic: No dysuria, Hematuria, or urinary frequency    Physical Exam:  ED Triage Vitals  Enc Vitals Group     BP 11/10/14 1611 179/109 mmHg     Pulse Rate 11/10/14 1611 89     Resp 11/10/14 1611 24     Temp 11/10/14 1611 98.3 F (36.8 C)     Temp Source 11/10/14 1611 Oral     SpO2 11/10/14 1611 98 %     Weight 11/10/14 1611 180 lb 9.6 oz (81.92 kg)     Height 11/10/14 1611 5\' 9"  (1.753 m)     Head Cir --      Peak Flow --      Pain Score 11/10/14 1613 0      Pain Loc --      Pain Edu? --      Excl. in [place]? --     General: Awake , Alert , and Oriented times 3; GCS 15 Head: Normal cephalic , atraumatic Eyes: Pupils equal , round, reactive to light Nose/Throat: No nasal drainage, patent upper airway without erythema or exudate.  Neck: Supple, Full range of motion, No anterior adenopathy or palpable thyroid masses. Mild right-sided JVD Lungs: Patient has bilateral rales at the bases approximately one third bilaterally from the base.  Heart: Regular rate, regular rhythm without murmurs , gallops , or rubs Abdomen: Soft, non tender without rebound, guarding , or rigidity; bowel sounds positive and symmetric in all 4 quadrants. No organomegaly .        Extremities: 2 plus symmetric pulses. No edema, clubbing or cyanosis Neurologic: normal ambulation, Motor symmetric without deficits, sensory intact Skin: warm, dry, no rashes   Labs:   All laboratory work was reviewed including any pertinent negatives or positives listed below:  Labs Reviewed  CBC WITH DIFFERENTIAL/PLATELET - Abnormal; Notable for the following:    RBC 3.61 (*)    Hemoglobin 11.0 (*)    HCT 33.8 (*)    RDW 16.1 (*)    Lymphs Abs 0.9 (*)    All other components within normal limits  COMPREHENSIVE METABOLIC PANEL    Radiology:    EXAM: CHEST - 2 VIEW  COMPARISON: 04/26/2013  FINDINGS: Cardiomediastinal silhouette projects slightly larger than the comparison plain film. Atherosclerotic calcifications of the aortic arch.  Fullness in the central vasculature.  Interstitial opacities extending from bilateral hilum, with interlobular septal thickening in the periphery of the lungs, particularly at the lung bases.  No pneumothorax. No pleural effusion.  No displaced fracture.  Unremarkable appearance of the upper abdomen.  IMPRESSION: Evidence of developing CHF/edema.  Atherosclerosis.  Signed,  Dulcy Fanny. Earleen Newport, DO  [facility]    I personally reviewed the radiologic studies     ED Course: Patient presents in pulmonary edema and states he does have some urine output and was given some IV Lasix with urine output but still remained hypoxic on pulse ox checked. His otherwise hemodynamically stable and will need to be on some supplemental oxygen until he can be dialyzed. Otherwise he does not appear to have any other complications      Final Clinical Impression:  Pulmonary edema History of renal failure Hypoxemia Final diagnoses:  None     Plan Inpatient management *  Daymon Larsen, MD 11/10/14 2215

## 2014-11-10 NOTE — ED Notes (Signed)
hospitalist at bedside to evaluate patient

## 2014-11-11 ENCOUNTER — Inpatient Hospital Stay (HOSPITAL_COMMUNITY)
Admit: 2014-11-11 | Discharge: 2014-11-11 | Disposition: A | Discharge status: Home / Self Care | Payer: Medicare Other | Attending: Internal Medicine | Admitting: Internal Medicine

## 2014-11-11 DIAGNOSIS — I351 Nonrheumatic aortic (valve) insufficiency: Secondary | ICD-10-CM

## 2014-11-11 LAB — BASIC METABOLIC PANEL
ANION GAP: 13 (ref 5–15)
BUN: 70 mg/dL — AB (ref 6–20)
CHLORIDE: 103 mmol/L (ref 101–111)
CO2: 23 mmol/L (ref 22–32)
Calcium: 8.7 mg/dL — ABNORMAL LOW (ref 8.9–10.3)
Creatinine, Ser: 12.1 mg/dL — ABNORMAL HIGH (ref 0.61–1.24)
GFR calc Af Amer: 5 mL/min — ABNORMAL LOW (ref >60)
GFR, EST NON AFRICAN AMERICAN: 4 mL/min — AB (ref >60)
Glucose, Bld: 122 mg/dL — ABNORMAL HIGH (ref 65–99)
Potassium: 4.8 mmol/L (ref 3.5–5.1)
SODIUM: 139 mmol/L (ref 135–145)

## 2014-11-11 LAB — RENAL FUNCTION PANEL
Albumin: 3.5 g/dL (ref 3.5–5.0)
Anion gap: 15 (ref 5–15)
BUN: 68 mg/dL — AB (ref 6–20)
CHLORIDE: 102 mmol/L (ref 101–111)
CO2: 20 mmol/L — AB (ref 22–32)
CREATININE: 12.25 mg/dL — AB (ref 0.61–1.24)
Calcium: 8.6 mg/dL — ABNORMAL LOW (ref 8.9–10.3)
GFR calc Af Amer: 4 mL/min — ABNORMAL LOW (ref >60)
GFR calc non Af Amer: 4 mL/min — ABNORMAL LOW (ref >60)
Glucose, Bld: 120 mg/dL — ABNORMAL HIGH (ref 65–99)
POTASSIUM: 4.8 mmol/L (ref 3.5–5.1)
Phosphorus: 6.2 mg/dL — ABNORMAL HIGH (ref 2.5–4.6)
Sodium: 137 mmol/L (ref 135–145)

## 2014-11-11 LAB — CBC
HEMATOCRIT: 33.5 % — AB (ref 40.0–52.0)
Hemoglobin: 10.8 g/dL — ABNORMAL LOW (ref 13.0–18.0)
MCH: 30.3 pg (ref 26.0–34.0)
MCHC: 32.3 g/dL (ref 32.0–36.0)
MCV: 93.9 fL (ref 80.0–100.0)
PLATELETS: 221 10*3/uL (ref 150–440)
RBC: 3.57 MIL/uL — AB (ref 4.40–5.90)
RDW: 16.3 % — AB (ref 11.5–14.5)
WBC: 6.8 10*3/uL (ref 3.8–10.6)

## 2014-11-11 LAB — MRSA PCR SCREENING: MRSA BY PCR: NEGATIVE

## 2014-11-11 MED ORDER — HYDRALAZINE HCL 50 MG PO TABS
50.0000 mg | ORAL_TABLET | Freq: Three times a day (TID) | ORAL | Status: DC
  Administered 2014-11-11 – 2014-11-12 (×3): 50 mg via ORAL
  Filled 2014-11-11 (×6): qty 1

## 2014-11-11 NOTE — Progress Notes (Signed)
*  PRELIMINARY RESULTS* Echocardiogram 2D Echocardiogram has been performed.  [name] 11/11/2014, 4:01 PM

## 2014-11-11 NOTE — Progress Notes (Signed)
Initial Nutrition Assessment   INTERVENTION:   Meals and Snacks: Cater to patient preferences  NUTRITION DIAGNOSIS:     No nutrition diagnosis at this time  GOAL:   Patient will meet greater than or equal to 90% of their needs  MONITOR:    (Energy Intake, Anthropometrics, Electrolyte/Renal Profile, Glucose Profile)  REASON FOR ASSESSMENT:   Diagnosis (dialysis)    ASSESSMENT:    Pt admitted with acute pulmonary edema likely due to dietary noncompliance and malignant HTN per MD note, ESRD on HD, down for dialysis on visit today  Past Medical History  Diagnosis Date  . Hypertension   . Peripheral vascular disease   . ESRD (end stage renal disease)      Diet Order:  Diet renal with fluid restriction Fluid restriction:: 1200 mL Fluid; Room service appropriate?: Yes; Fluid consistency:: Thin   Energy Intake: recorded po intake 100% of meal  Electrolyte and Renal Profile:  Recent Labs Lab 11/10/14 1641 11/11/14 1200  BUN 55* 68*  70*  CREATININE 10.97* 12.25*  12.10*  NA 140 137  139  K 4.4 4.8  4.8  PHOS  --  6.2*   Glucose Profile: No results for input(s): GLUCAP in the last 72 hours.  Meds: phoslo, sensipar, lasix  Skin:  Reviewed, no issues  Last BM:  9/19  Height:   Ht Readings from Last 1 Encounters:  11/10/14 5\' 9"  (1.753 m)    Weight: no previous wt encounters  Wt Readings from Last 1 Encounters:  11/11/14 169 lb 6.4 oz (76.839 kg)    Ideal Body Weight:     BMI:  Body mass index is 25 kg/(m^2).  LOW Care Level  Kerman Passey MS, [place], LDN [phone] Pager

## 2014-11-11 NOTE — Progress Notes (Signed)
Pt. B/P is 184/111, 10 mg hydralazine given IV, Dr. Reece Levy notified, no new orders. Pt is asymptomatic.  A&O sitting in recliner. Will continue to monitor pt.

## 2014-11-11 NOTE — Progress Notes (Signed)
[facility]  ROUNDING NOTE   Subjective:  Patient seen during HD.   Tolerating well BFR 400 at the moment.  Had pulmonary edema on CXR.  Was hypoxic yesterday.   Objective:  Vital signs in last 24 hours:  Temp:  [98 F (36.7 C)-98.5 F (36.9 C)] 98 F (36.7 C) (09/19 0521) Pulse Rate:  [82-107] 86 (09/19 1130) Resp:  [16-28] 26 (09/19 1130) BP: (169-189)/(102-125) 184/112 mmHg (09/19 1130) SpO2:  [87 %-98 %] 95 % (09/19 0521) Weight:  [81.92 kg (180 lb 9.6 oz)] 81.92 kg (180 lb 9.6 oz) (09/18 1611)  Weight change:  Filed Weights   11/10/14 1611  Weight: 81.92 kg (180 lb 9.6 oz)    Intake/Output: I/O last 3 completed shifts: In: 240 [P.O.:240] Out: -    Intake/Output this shift:  Total I/O In: 240 [P.O.:240] Out: 200 [Urine:200]  Physical Exam: General: NAD,   Head: Normocephalic, atraumatic. Moist oral mucosal membranes  Eyes: Anicteric  Neck: Supple, trachea midline  Lungs:  Basilar rales, normal effort  Heart: Regular rate and rhythm S1S2 no rubs  Abdomen:  Soft, nontender, BS present.  Extremities:  trace peripheral edema.  Neurologic: Nonfocal, moving all four extremities  Skin: No lesions  Access: Left UE AVF    Basic Metabolic Panel:  Recent Labs Lab 11/10/14 1641  NA 140  K 4.4  CL 102  CO2 23  GLUCOSE 120*  BUN 55*  CREATININE 10.97*  CALCIUM 8.4*    Liver Function Tests:  Recent Labs Lab 11/10/14 1641  AST 30  ALT 23  ALKPHOS 105  BILITOT 0.5  PROT 7.6  ALBUMIN 3.9   No results for input(s): LIPASE, AMYLASE in the last 168 hours. No results for input(s): AMMONIA in the last 168 hours.  CBC:  Recent Labs Lab 11/10/14 1641  WBC 6.4  NEUTROABS 4.7  HGB 11.0*  HCT 33.8*  MCV 93.7  PLT 226    Cardiac Enzymes: No results for input(s): CKTOTAL, CKMB, CKMBINDEX, TROPONINI in the last 168 hours.  BNP: Invalid input(s): POCBNP  CBG: No results for input(s): GLUCAP in the last 168  hours.  Microbiology: Results for orders placed or performed in visit on 04/26/13  Stool culture     Status: None   Collection Time: 04/30/13  8:56 AM  Result Value Ref Range Status   Micro Text Report   Final       COMMENT                   NO SALMONELLA OR SHIGELLA ISOLATED   COMMENT                   NO PATHOGENIC E.COLI DETECTED   COMMENT                   NO CAMPYLOBACTER ANTIGEN DETECTED   ANTIBIOTIC                                                      Clostridium Difficile [facility])     Status: None   Collection Time: 04/30/13  8:56 AM  Result Value Ref Range Status   Micro Text Report   Final       C.DIFFICILE ANTIGEN       C.DIFFICILE GDH ANTIGEN : NEGATIVE   C.DIFFICILE  TOXIN A/B     C.DIFFICILE TOXINS A AND B : NEGATIVE   INTERPRETATION            Negative for C. difficile.    ANTIBIOTIC                                                        Coagulation Studies: No results for input(s): LABPROT, INR in the last 72 hours.  Urinalysis: No results for input(s): COLORURINE, LABSPEC, PHURINE, GLUCOSEU, HGBUR, BILIRUBINUR, KETONESUR, PROTEINUR, UROBILINOGEN, NITRITE, LEUKOCYTESUR in the last 72 hours.  Invalid input(s): APPERANCEUR    Imaging: Dg Chest 2 View  11/10/2014   CLINICAL DATA:  59 year old male with a history of shortness of breath.  EXAM: CHEST - 2 VIEW  COMPARISON:  04/26/2013  FINDINGS: Cardiomediastinal silhouette projects slightly larger than the comparison plain film. Atherosclerotic calcifications of the aortic arch.  Fullness in the central vasculature.  Interstitial opacities extending from bilateral hilum, with interlobular septal thickening in the periphery of the lungs, particularly at the lung bases.  No pneumothorax.  No pleural effusion.  No displaced fracture.  Unremarkable appearance of the upper abdomen.  IMPRESSION: Evidence of developing CHF/edema.  Atherosclerosis.  Signed,  Dulcy Fanny. Earleen Newport, DO  [facility]  [facility]  [facility]   Electronically Signed   By: Corrie Mckusick D.O.   On: 11/10/2014 16:51     Medications:     . amLODipine  10 mg Oral Daily  . calcium acetate (Phos Binder)  667 mg Oral TID WC  . cinacalcet  90 mg Oral Q breakfast  . enalapril  10 mg Oral Daily  . furosemide  40 mg Intravenous BID  . heparin  5,000 Units Subcutaneous 3 times per day  . hydrALAZINE  25 mg Oral TID  . sodium chloride  3 mL Intravenous Q12H   acetaminophen **OR** acetaminophen, hydrALAZINE, labetalol  Assessment/ Plan:  59 y.o. male with pmhx of hypertension, hepatitis C, ESRD started HD 10/13, anemia of CKD, admission for diarrhea/hyperkalemia 10/2012, admission for GI bleed 10/2012, SHPTH, GI bleed 3/15, seizure episode, admission for pulmonary edema 9/16.  1.  ESRD on HD MWF:  Patient seen and evaluated during dialysis.  He appears to be tolerating this well.  Complete dialysis today. Next dialysis for Wednesday.  2.  Pulmonary edema.  Patient seen during dialysis.  Tolerating ultrafiltration well.  Patient counseled on fluid restriction.  3.  Secondary hyperparathyroidism.  As an outpatient PTH was quite high.  Continue Sensipar as well as PhosLo.  He was on PhosLo liquid as an outpatient.  4.anemia chronic kidney disease.  Hemoglobin found to be 11.  Hold off on Procrit for now.   LOS: 1 LATEEF, MUNSOOR 9/19/201611:33 AM

## 2014-11-11 NOTE — Care Management (Signed)
Patient admitted with shortness of breath and found to be in pulmonary edema upon admission.  Denies noncompliance with dialysis regime but there is verbalized concerns regards compliance with sodium intake and fluid restriction.  Lives alone.  Uses [facility] for HD transport to [facility].  He is followed by [facility], but can not remember the name of the doctor. ast seen 3 months ago.  At present- he has no phone.  Lost his cell phone.  He was to have been seen by vascular tomorrow in regards to his fistula.  He verbalizes that he will need [facility] to assist with transport home tomorrow.  He declines need or desire to have home health nurse follow up.

## 2014-11-11 NOTE — Progress Notes (Signed)
Resident's skin is warm and dry. Resident has multiple scars to BLE. Bilateral hands have hypopigmentation.No other skin issues noted. Verifying RN, CB.  Patient arrived to unit via stretcher. Patient is alert and oriented. General room orientation given. TEDS and SCD's on.  B/ P continues to be elevated. Hydralazine given. Will continue to monitor patient.

## 2014-11-11 NOTE — Care Management Note (Signed)
Patients is active at outpatient dialysis on 1st shift  MWF [facility].  I have sent admission records to the center and will update with additional records at discharge.  [facility] Liaison  [phone]

## 2014-11-11 NOTE — Progress Notes (Signed)
[facility] [facility] [place] NAME: [name]    MR#:  [id number]  DATE OF BIRTH:  [birth year]  SUBJECTIVE:  Coughing up pink frothy sputum. Sob. No cp  REVIEW OF SYSTEMS:   Review of Systems  Constitutional: Negative for fever, chills and weight loss.  HENT: Negative for ear discharge, ear pain and nosebleeds.   Eyes: Negative for blurred vision, pain and discharge.  Respiratory: Positive for cough, sputum production and shortness of breath. Negative for wheezing and stridor.   Cardiovascular: Negative for chest pain, palpitations, orthopnea and PND.  Gastrointestinal: Negative for nausea, vomiting, abdominal pain and diarrhea.  Genitourinary: Negative for urgency and frequency.  Musculoskeletal: Negative for back pain and joint pain.  Neurological: Positive for weakness. Negative for sensory change, speech change and focal weakness.  Psychiatric/Behavioral: Negative for depression. The patient is not nervous/anxious.   All other systems reviewed and are negative.  Tolerating Diet:yes Tolerating PT: not needed  DRUG ALLERGIES:   Allergies  Allergen Reactions  . Sulfa Antibiotics Rash    VITALS:  Blood pressure 187/109, pulse 86, temperature 97.8 F (36.6 C), temperature source Oral, resp. rate 26, height 5\' 9"  (1.753 m), weight 81.9 kg (180 lb 8.9 oz), SpO2 97 %.  PHYSICAL EXAMINATION:   Physical Exam  GENERAL:  59 y.o.-year-old patient lying in the bed with no acute distress.  EYES: Pupils equal, round, reactive to light and accommodation. No scleral icterus. Extraocular muscles intact.  HEENT: Head atraumatic, normocephalic. Oropharynx and nasopharynx clear.  NECK:  Supple, no jugular venous distention. No thyroid enlargement, no tenderness.  LUNGS: coarse breath sounds bilaterally, no wheezing, rales, rhonchi. No use of accessory muscles of respiration.  CARDIOVASCULAR: S1, S2 normal. No murmurs, rubs, or gallops.   ABDOMEN: Soft, nontender, nondistended. Bowel sounds present. No organomegaly or mass.  EXTREMITIES: No cyanosis, clubbing or edema b/l.    NEUROLOGIC: Cranial nerves II through XII are intact. No focal Motor or sensory deficits b/l.   PSYCHIATRIC: The patient is alert and oriented x 3.  SKIN: No obvious rash, lesion, or ulcer.    LABORATORY PANEL:   CBC  Recent Labs Lab 11/11/14 1200  WBC 6.8  HGB 10.8*  HCT 33.5*  PLT 221    Chemistries   Recent Labs Lab 11/10/14 1641  NA 140  K 4.4  CL 102  CO2 23  GLUCOSE 120*  BUN 55*  CREATININE 10.97*  CALCIUM 8.4*  AST 30  ALT 23  ALKPHOS 105  BILITOT 0.5    Cardiac Enzymes No results for input(s): TROPONINI in the last 168 hours.  RADIOLOGY:  Dg Chest 2 View  11/10/2014   CLINICAL DATA:  59 year old male with a history of shortness of breath.  EXAM: CHEST - 2 VIEW  COMPARISON:  04/26/2013  FINDINGS: Cardiomediastinal silhouette projects slightly larger than the comparison plain film. Atherosclerotic calcifications of the aortic arch.  Fullness in the central vasculature.  Interstitial opacities extending from bilateral hilum, with interlobular septal thickening in the periphery of the lungs, particularly at the lung bases.  No pneumothorax.  No pleural effusion.  No displaced fracture.  Unremarkable appearance of the upper abdomen.  IMPRESSION: Evidence of developing CHF/edema.  Atherosclerosis.  Signed,  Dulcy Fanny. Earleen Newport, DO  [facility]  [facility]   Electronically Signed   By: Corrie Mckusick D.O.   On: 11/10/2014 16:51     ASSESSMENT AND PLAN:  [name] is a  59 y.o. male with a known history of end-stage renal disease on Monday Wednesday Friday hemodialysis, hypertension presents to the hospital secondary to worsening breathing.  #1 acute pulmonary edema-likely dietary noncompliance and Malignant HTn -Likely acute on chronic diastolic CHF exacerbation. No previous  echo. - IV Lasix twice a day. -Nephrology consult, will need dialysis with UF. -continue oxygen support at this time. Patient is not on home oxygen.  #2 end-stage renal disease on Monday Wednesday Friday hemodialysis-nephrology has been consulted -getting HD today. -Continue his phosphate binders  #3 malignant hypertension-continue home medications of oral Norvasc, enalapril and hydralazine. -IV hydralazine when necessary  #4 anemia of chronic disease-stable  #5 DVT prophylaxis-subcutaneous heparin  Case discussed with Care Management/Social Worker. Management plans discussed with the patient, family and they are in agreement.  CODE STATUS: full  DVT Prophylaxis: heparin  TOTAL criticalTIME TAKING CARE OF THIS PATIENT: 41minutes.  >50% time spent on counselling and coordination of care  POSSIBLE D/C IN 1-2 DAYS, DEPENDING ON CLINICAL CONDITION.   PATEL,SONA M.D on 11/11/2014 at 12:49 PM  Between 7am to 6pm - Pager - [phone]  After 6pm go to [email or web address] - password [email or web address] [facility]  Office  [phone]  CC: Primary care physician; Pcp Not In System

## 2014-11-11 NOTE — Progress Notes (Signed)
Patient resting quietly today. Blood pressures have improved since returning from dialysis.

## 2014-11-12 ENCOUNTER — Encounter: Payer: Self-pay | Admitting: Vascular Surgery

## 2014-11-12 MED ORDER — ENALAPRIL MALEATE 10 MG PO TABS
10.0000 mg | ORAL_TABLET | Freq: Two times a day (BID) | ORAL | Status: DC
  Administered 2014-11-12: 10 mg via ORAL
  Filled 2014-11-12: qty 1

## 2014-11-12 MED ORDER — HYDRALAZINE HCL 25 MG PO TABS: 50.0000 mg | ORAL_TABLET | Freq: Three times a day (TID) | ORAL | Status: DC

## 2014-11-12 MED ORDER — ENALAPRIL MALEATE 10 MG PO TABS: 10.0000 mg | ORAL_TABLET | Freq: Two times a day (BID) | ORAL | Status: DC

## 2014-11-12 NOTE — Progress Notes (Signed)
Discharge instructions explained to pt / verbalized an understanding/ iv and tele removed/ will transport off unit via wheelchair when ride arrives.  

## 2014-11-12 NOTE — Discharge Instructions (Signed)
Low salt and renal diet Stop smoking

## 2014-11-12 NOTE — Progress Notes (Signed)
[facility]  ROUNDING NOTE   Subjective:  Patient doing much better today. Had HD yesterday. Much less short of breath. Counseled pt on fluid restriction.  Objective:  Vital signs in last 24 hours:  Temp:  [97.6 F (36.4 C)-99.2 F (37.3 C)] 98.2 F (36.8 C) (09/20 0501) Pulse Rate:  [80-97] 86 (09/20 0725) Resp:  [16-28] 18 (09/20 0725) BP: (119-187)/(63-114) 167/96 mmHg (09/20 0725) SpO2:  [91 %-97 %] 97 % (09/20 0725) Weight:  [76.839 kg (169 lb 6.4 oz)-81.9 kg (180 lb 8.9 oz)] 76.839 kg (169 lb 6.4 oz) (09/19 1454)  Weight change: -0.02 kg (-0.7 oz) Filed Weights   11/11/14 1020 11/11/14 1430 11/11/14 1454  Weight: 81.9 kg (180 lb 8.9 oz) 77.5 kg (170 lb 13.7 oz) 76.839 kg (169 lb 6.4 oz)    Intake/Output: I/O last 3 completed shifts: In: 600 [P.O.:600] Out: 2450 [Urine:500; Other:1950]   Intake/Output this shift:  Total I/O In: -  Out: 140 [Urine:140]  Physical Exam: General: NAD  Head: Normocephalic, atraumatic. Moist oral mucosal membranes  Eyes: Anicteric  Neck: Supple, trachea midline  Lungs:  CTAB, normal effort  Heart: Regular rate and rhythm S1S2 no rubs  Abdomen:  Soft, nontender, BS present.  Extremities:  trace peripheral edema.  Neurologic: Nonfocal, moving all four extremities  Skin: No lesions  Access: Left UE AVF    Basic Metabolic Panel:  Recent Labs Lab 11/10/14 1641 11/11/14 1200  NA 140 137  139  K 4.4 4.8  4.8  CL 102 102  103  CO2 23 20*  23  GLUCOSE 120* 120*  122*  BUN 55* 68*  70*  CREATININE 10.97* 12.25*  12.10*  CALCIUM 8.4* 8.6*  8.7*  PHOS  --  6.2*    Liver Function Tests:  Recent Labs Lab 11/10/14 1641 11/11/14 1200  AST 30  --   ALT 23  --   ALKPHOS 105  --   BILITOT 0.5  --   PROT 7.6  --   ALBUMIN 3.9 3.5   No results for input(s): LIPASE, AMYLASE in the last 168 hours. No results for input(s): AMMONIA in the last 168 hours.  CBC:  Recent Labs Lab 11/10/14 1641  11/11/14 1200  WBC 6.4 6.8  NEUTROABS 4.7  --   HGB 11.0* 10.8*  HCT 33.8* 33.5*  MCV 93.7 93.9  PLT 226 221    Cardiac Enzymes: No results for input(s): CKTOTAL, CKMB, CKMBINDEX, TROPONINI in the last 168 hours.  BNP: Invalid input(s): POCBNP  CBG: No results for input(s): GLUCAP in the last 168 hours.  Microbiology: Results for orders placed or performed during the hospital encounter of 11/10/14  MRSA PCR Screening     Status: None   Collection Time: 11/11/14  4:13 PM  Result Value Ref Range Status   MRSA by PCR NEGATIVE NEGATIVE Final    Comment:        The GeneXpert MRSA Assay (FDA approved for NASAL specimens only), is one component of a comprehensive MRSA colonization surveillance program. It is not intended to diagnose MRSA infection nor to guide or monitor treatment for MRSA infections.     Coagulation Studies: No results for input(s): LABPROT, INR in the last 72 hours.  Urinalysis: No results for input(s): COLORURINE, LABSPEC, PHURINE, GLUCOSEU, HGBUR, BILIRUBINUR, KETONESUR, PROTEINUR, UROBILINOGEN, NITRITE, LEUKOCYTESUR in the last 72 hours.  Invalid input(s): APPERANCEUR    Imaging: Dg Chest 2 View  11/10/2014   CLINICAL DATA:  59 year old male with  a history of shortness of breath.  EXAM: CHEST - 2 VIEW  COMPARISON:  04/26/2013  FINDINGS: Cardiomediastinal silhouette projects slightly larger than the comparison plain film. Atherosclerotic calcifications of the aortic arch.  Fullness in the central vasculature.  Interstitial opacities extending from bilateral hilum, with interlobular septal thickening in the periphery of the lungs, particularly at the lung bases.  No pneumothorax.  No pleural effusion.  No displaced fracture.  Unremarkable appearance of the upper abdomen.  IMPRESSION: Evidence of developing CHF/edema.  Atherosclerosis.  Signed,  Dulcy Fanny. Earleen Newport, DO  [facility]  [facility]   Electronically  Signed   By: Corrie Mckusick D.O.   On: 11/10/2014 16:51     Medications:     . amLODipine  10 mg Oral Daily  . calcium acetate (Phos Binder)  667 mg Oral TID WC  . cinacalcet  90 mg Oral Q breakfast  . enalapril  10 mg Oral BID  . furosemide  40 mg Intravenous BID  . heparin  5,000 Units Subcutaneous 3 times per day  . hydrALAZINE  50 mg Oral TID  . sodium chloride  3 mL Intravenous Q12H   acetaminophen **OR** acetaminophen, hydrALAZINE, labetalol  Assessment/ Plan:  59 y.o. male with pmhx of hypertension, hepatitis C, ESRD started HD 10/13, anemia of CKD, admission for diarrhea/hyperkalemia 10/2012, admission for GI bleed 10/2012, SHPTH, GI bleed 3/15, seizure episode, admission for pulmonary edema 9/16.  1.  ESRD on HD MWF:  Pt had HD yesterday, tolerated well, next HD tomorrow as outpt.  2.  Pulmonary edema.  Improved with ultrafiltration, counseled pt on fluid and salt restriction, pt verbalized understanding.  3.  Secondary hyperparathyroidism. Phos 6.2, continue phoslo as outpt, pt declined PTHectomy as outpt, PTH was quite high, he's had trouble tolerating sensipar as outpt.  4.anemia chronic kidney disease.  Continue to monitor hgb as outpt, pt on epogen protocol as outpt.   LOS: 2 LATEEF, MUNSOOR 9/20/201610:05 AM

## 2014-11-12 NOTE — Care Management Important Message (Signed)
Important Message  Patient Details  Name: [name] MRN: [id number] Date of Birth: [birth year]   Medicare Important Message Given:  Yes-second notification given    Darius Bump Allmond 11/12/2014, 10:29 AM

## 2014-11-12 NOTE — Discharge Summary (Signed)
[place] at [place] NAME: [name]    MR#:  [id number]  DATE OF BIRTH:  1955-08-07  DATE OF ADMISSION:  11/10/2014 ADMITTING PHYSICIAN: Gladstone Lighter, MD  DATE OF DISCHARGE: 11/12/14  PRIMARY CARE PHYSICIAN: Pcp Not In System    ADMISSION DIAGNOSIS:  Chronic pulmonary edema [J81.1] Renal failure [N19]  DISCHARGE DIAGNOSIS:  Acute Pulmonary edema-resolved ESRD Malignant HTN SECONDARY DIAGNOSIS:   Past Medical History  Diagnosis Date  . Hypertension   . Peripheral vascular disease   . ESRD (end stage renal disease)     HOSPITAL COURSE:   59 y.o. male with a known history of end-stage renal disease on Monday Wednesday Friday hemodialysis, hypertension presents to the hospital secondary to worsening breathing.  #1 acute pulmonary edema-likely dietary noncompliance and Malignant HTn -Likely acute on chronic diastolic CHF exacerbation. No previous echo.results pending on echo -s/p HD with about 1900 cc UF. sats 94% on RA  Patient is not on home oxygen.  #2 end-stage renal disease on Monday Wednesday Friday hemodialysis-nephrology has been consulted -pt will resume outpt HD -Continue his phosphate binders  #3 malignant hypertension-continue home medications of oral Norvasc, enalapril and hydralazine. -IV hydralazine when necessary -po home meds dosage adjusted Sat restricted diet  #4 anemia of chronic disease-stable  #5 DVT prophylaxis-subcutaneous heparin  Overall better D/c home later today  DISCHARGE CONDITIONS:  fair  CONSULTS OBTAINED:  Treatment Team:  Murlean Iba, MD  DRUG ALLERGIES:   Allergies  Allergen Reactions  . Sulfa Antibiotics Rash    DISCHARGE MEDICATIONS:   Current Discharge Medication List    CONTINUE these medications which have CHANGED   Details  enalapril (VASOTEC) 10 MG tablet Take 1 tablet (10 mg total) by mouth 2 (two) times daily. Qty: 60 tablet, Refills: 0     hydrALAZINE (APRESOLINE) 25 MG tablet Take 2 tablets (50 mg total) by mouth 3 (three) times daily. Qty: 90 tablet, Refills: 0      CONTINUE these medications which have NOT CHANGED   Details  amLODipine (NORVASC) 10 MG tablet Take 10 mg by mouth daily.    cinacalcet (SENSIPAR) 90 MG tablet Take 90 mg by mouth daily.    PHOSLYRA 667 MG/5ML SOLN Take 5 mLs by mouth 3 (three) times daily with meals.        If you experience worsening of your admission symptoms, develop shortness of breath, life threatening emergency, suicidal or homicidal thoughts you must seek medical attention immediately by calling 911 or calling your MD immediately  if symptoms less severe.  You Must read complete instructions/literature along with all the possible adverse reactions/side effects for all the Medicines you take and that have been prescribed to you. Take any new Medicines after you have completely understood and accept all the possible adverse reactions/side effects.   Please note  You were cared for by a hospitalist during your hospital stay. If you have any questions about your discharge medications or the care you received while you were in the hospital after you are discharged, you can call the unit and asked to speak with the hospitalist on call if the hospitalist that took care of you is not available. Once you are discharged, your primary care physician will handle any further medical issues. Please note that NO REFILLS for any discharge medications will be authorized once you are discharged, as it is imperative that you return to your primary care physician (or establish a relationship with  a primary care physician if you do not have one) for your aftercare needs so that they can reassess your need for medications and monitor your lab values. Today   SUBJECTIVE   Doing well  VITAL SIGNS:  Blood pressure 179/91, pulse 85, temperature 98.2 F (36.8 C), temperature source Oral, resp. rate 16,  height 5\' 9"  (1.753 m), weight 76.839 kg (169 lb 6.4 oz), SpO2 94 %.  I/O:   Intake/Output Summary (Last 24 hours) at 11/12/14 0801 Last data filed at 11/11/14 1847  Gross per 24 hour  Intake    360 ml  Output   2250 ml  Net  -1890 ml    PHYSICAL EXAMINATION:  GENERAL:  59 y.o.-year-old patient lying in the bed with no acute distress.  EYES: Pupils equal, round, reactive to light and accommodation. No scleral icterus. Extraocular muscles intact.  HEENT: Head atraumatic, normocephalic. Oropharynx and nasopharynx clear.  NECK:  Supple, no jugular venous distention. No thyroid enlargement, no tenderness.  LUNGS: Normal breath sounds bilaterally, no wheezing, rales,rhonchi or crepitation. No use of accessory muscles of respiration.  CARDIOVASCULAR: S1, S2 normal. No murmurs, rubs, or gallops.  ABDOMEN: Soft, non-tender, non-distended. Bowel sounds present. No organomegaly or mass.  EXTREMITIES: No pedal edema, cyanosis, or clubbing.  NEUROLOGIC: Cranial nerves II through XII are intact. Muscle strength 5/5 in all extremities. Sensation intact. Gait not checked.  PSYCHIATRIC: The patient is alert and oriented x 3.  SKIN: No obvious rash, lesion, or ulcer.   DATA REVIEW:   CBC   Recent Labs Lab 11/11/14 1200  WBC 6.8  HGB 10.8*  HCT 33.5*  PLT 221    Chemistries   Recent Labs Lab 11/10/14 1641 11/11/14 1200  NA 140 137  139  K 4.4 4.8  4.8  CL 102 102  103  CO2 23 20*  23  GLUCOSE 120* 120*  122*  BUN 55* 68*  70*  CREATININE 10.97* 12.25*  12.10*  CALCIUM 8.4* 8.6*  8.7*  AST 30  --   ALT 23  --   ALKPHOS 105  --   BILITOT 0.5  --     Microbiology Results   Recent Results (from the past 240 hour(s))  MRSA PCR Screening     Status: None   Collection Time: 11/11/14  4:13 PM  Result Value Ref Range Status   MRSA by PCR NEGATIVE NEGATIVE Final    Comment:        The GeneXpert MRSA Assay (FDA approved for NASAL specimens only), is one component of  a comprehensive MRSA colonization surveillance program. It is not intended to diagnose MRSA infection nor to guide or monitor treatment for MRSA infections.     RADIOLOGY:  Dg Chest 2 View  11/10/2014   CLINICAL DATA:  59 year old male with a history of shortness of breath.  EXAM: CHEST - 2 VIEW  COMPARISON:  04/26/2013  FINDINGS: Cardiomediastinal silhouette projects slightly larger than the comparison plain film. Atherosclerotic calcifications of the aortic arch.  Fullness in the central vasculature.  Interstitial opacities extending from bilateral hilum, with interlobular septal thickening in the periphery of the lungs, particularly at the lung bases.  No pneumothorax.  No pleural effusion.  No displaced fracture.  Unremarkable appearance of the upper abdomen.  IMPRESSION: Evidence of developing CHF/edema.  Atherosclerosis.  Signed,  Dulcy Fanny. Earleen Newport, DO  [facility]  [facility]   Electronically Signed   By: Corrie Mckusick D.O.   On:  11/10/2014 16:51     Management plans discussed with the patient, family and they are in agreement.  CODE STATUS:     Code Status Orders        Start     Ordered   11/10/14 2324  Full code   Continuous     11/10/14 2323      TOTAL TIME TAKING CARE OF THIS PATIENT: 46minutes.    PATEL,SONA M.D on 11/12/2014 at 8:01 AM  Between 7am to 6pm - Pager - [phone] After 6pm go to [email or web address] - password [email or web address] [facility]  Office  [phone]  CC: Primary care physician; Pcp Not In System

## 2014-11-13 ENCOUNTER — Encounter: Payer: Self-pay | Admitting: Vascular Surgery

## 2014-12-02 ENCOUNTER — Emergency Department: Payer: Medicare Other

## 2014-12-02 ENCOUNTER — Inpatient Hospital Stay
Admission: EM | Admit: 2014-12-02 | Discharge: 2014-12-04 | DRG: 189 | Disposition: A | Discharge status: Home / Self Care | Payer: Medicare Other | Attending: Internal Medicine | Admitting: Internal Medicine

## 2014-12-02 ENCOUNTER — Encounter: Payer: Self-pay | Admitting: Emergency Medicine

## 2014-12-02 DIAGNOSIS — D631 Anemia in chronic kidney disease: Secondary | ICD-10-CM | POA: Diagnosis present

## 2014-12-02 DIAGNOSIS — I739 Peripheral vascular disease, unspecified: Secondary | ICD-10-CM | POA: Diagnosis present

## 2014-12-02 DIAGNOSIS — N186 End stage renal disease: Secondary | ICD-10-CM | POA: Diagnosis present

## 2014-12-02 DIAGNOSIS — B1920 Unspecified viral hepatitis C without hepatic coma: Secondary | ICD-10-CM | POA: Diagnosis present

## 2014-12-02 DIAGNOSIS — Z8249 Family history of ischemic heart disease and other diseases of the circulatory system: Secondary | ICD-10-CM

## 2014-12-02 DIAGNOSIS — N2581 Secondary hyperparathyroidism of renal origin: Secondary | ICD-10-CM | POA: Diagnosis present

## 2014-12-02 DIAGNOSIS — Z9889 Other specified postprocedural states: Secondary | ICD-10-CM

## 2014-12-02 DIAGNOSIS — F17200 Nicotine dependence, unspecified, uncomplicated: Secondary | ICD-10-CM | POA: Diagnosis present

## 2014-12-02 DIAGNOSIS — Z882 Allergy status to sulfonamides status: Secondary | ICD-10-CM | POA: Diagnosis not present

## 2014-12-02 DIAGNOSIS — E8770 Fluid overload, unspecified: Secondary | ICD-10-CM | POA: Diagnosis present

## 2014-12-02 DIAGNOSIS — Z992 Dependence on renal dialysis: Secondary | ICD-10-CM | POA: Diagnosis not present

## 2014-12-02 DIAGNOSIS — J811 Chronic pulmonary edema: Principal | ICD-10-CM

## 2014-12-02 DIAGNOSIS — Z716 Tobacco abuse counseling: Secondary | ICD-10-CM | POA: Diagnosis not present

## 2014-12-02 DIAGNOSIS — J810 Acute pulmonary edema: Secondary | ICD-10-CM

## 2014-12-02 DIAGNOSIS — I120 Hypertensive chronic kidney disease with stage 5 chronic kidney disease or end stage renal disease: Secondary | ICD-10-CM | POA: Diagnosis present

## 2014-12-02 LAB — COMPREHENSIVE METABOLIC PANEL
ALBUMIN: 3.6 g/dL (ref 3.5–5.0)
ALT: 25 U/L (ref 17–63)
AST: 28 U/L (ref 15–41)
Alkaline Phosphatase: 121 U/L (ref 38–126)
Anion gap: 9 (ref 5–15)
BILIRUBIN TOTAL: 0.8 mg/dL (ref 0.3–1.2)
BUN: 33 mg/dL — AB (ref 6–20)
CO2: 26 mmol/L (ref 22–32)
CREATININE: 7.19 mg/dL — AB (ref 0.61–1.24)
Calcium: 8.1 mg/dL — ABNORMAL LOW (ref 8.9–10.3)
Chloride: 103 mmol/L (ref 101–111)
GFR calc Af Amer: 9 mL/min — ABNORMAL LOW (ref >60)
GFR, EST NON AFRICAN AMERICAN: 7 mL/min — AB (ref >60)
GLUCOSE: 90 mg/dL (ref 65–99)
POTASSIUM: 3.6 mmol/L (ref 3.5–5.1)
Sodium: 138 mmol/L (ref 135–145)
TOTAL PROTEIN: 7 g/dL (ref 6.5–8.1)

## 2014-12-02 LAB — CBC
HEMATOCRIT: 34.2 % — AB (ref 40.0–52.0)
HEMOGLOBIN: 11.3 g/dL — AB (ref 13.0–18.0)
MCH: 30.6 pg (ref 26.0–34.0)
MCHC: 33.1 g/dL (ref 32.0–36.0)
MCV: 92.6 fL (ref 80.0–100.0)
Platelets: 214 10*3/uL (ref 150–440)
RBC: 3.69 MIL/uL — AB (ref 4.40–5.90)
RDW: 15.8 % — AB (ref 11.5–14.5)
WBC: 5.2 10*3/uL (ref 3.8–10.6)

## 2014-12-02 LAB — TROPONIN I: Troponin I: 0.08 ng/mL — ABNORMAL HIGH (ref <0.031)

## 2014-12-02 MED ORDER — ENOXAPARIN SODIUM 40 MG/0.4ML ~~LOC~~ SOLN: 40.0000 mg | SUBCUTANEOUS | Status: DC

## 2014-12-02 MED ORDER — ENALAPRIL MALEATE 5 MG PO TABS
10.0000 mg | ORAL_TABLET | Freq: Two times a day (BID) | ORAL | Status: DC
  Administered 2014-12-02 – 2014-12-04 (×4): 10 mg via ORAL
  Filled 2014-12-02 (×4): qty 2

## 2014-12-02 MED ORDER — CALCIUM ACETATE (PHOS BINDER) 667 MG/5ML PO SOLN
2001.0000 mg | Freq: Three times a day (TID) | ORAL | Status: DC
  Administered 2014-12-03 – 2014-12-04 (×2): 2001 mg via ORAL
  Filled 2014-12-02 (×13): qty 15

## 2014-12-02 MED ORDER — HEPARIN SODIUM (PORCINE) 1000 UNIT/ML DIALYSIS
1000.0000 [IU] | INTRAMUSCULAR | Status: DC | PRN
  Filled 2014-12-02: qty 1

## 2014-12-02 MED ORDER — HYDRALAZINE HCL 50 MG PO TABS
50.0000 mg | ORAL_TABLET | Freq: Three times a day (TID) | ORAL | Status: DC
  Administered 2014-12-02 – 2014-12-04 (×4): 50 mg via ORAL
  Filled 2014-12-02 (×4): qty 1

## 2014-12-02 MED ORDER — SODIUM CHLORIDE 0.9 % IV SOLN: 100.0000 mL | INTRAVENOUS | Status: DC | PRN

## 2014-12-02 MED ORDER — LIDOCAINE HCL (PF) 1 % IJ SOLN
5.0000 mL | INTRAMUSCULAR | Status: DC | PRN
  Filled 2014-12-02: qty 5

## 2014-12-02 MED ORDER — CINACALCET HCL 30 MG PO TABS
90.0000 mg | ORAL_TABLET | Freq: Every day | ORAL | Status: DC
  Administered 2014-12-04: 90 mg via ORAL
  Filled 2014-12-02 (×3): qty 3

## 2014-12-02 MED ORDER — ALTEPLASE 2 MG IJ SOLR: 2.0000 mg | Freq: Once | INTRAMUSCULAR | Status: DC | PRN

## 2014-12-02 MED ORDER — HEPARIN SODIUM (PORCINE) 5000 UNIT/ML IJ SOLN
5000.0000 [IU] | Freq: Two times a day (BID) | INTRAMUSCULAR | Status: DC
  Administered 2014-12-02 – 2014-12-03 (×2): 5000 [IU] via SUBCUTANEOUS
  Filled 2014-12-02 (×3): qty 1

## 2014-12-02 MED ORDER — PENTAFLUOROPROP-TETRAFLUOROETH EX AERO: 1.0000 "application " | INHALATION_SPRAY | CUTANEOUS | Status: DC | PRN

## 2014-12-02 MED ORDER — LIDOCAINE-PRILOCAINE 2.5-2.5 % EX CREA
1.0000 "application " | TOPICAL_CREAM | CUTANEOUS | Status: DC | PRN
  Filled 2014-12-02: qty 5

## 2014-12-02 MED ORDER — LORATADINE 10 MG PO TABS
10.0000 mg | ORAL_TABLET | Freq: Every day | ORAL | Status: DC
  Administered 2014-12-03 – 2014-12-04 (×2): 10 mg via ORAL
  Filled 2014-12-02 (×3): qty 1

## 2014-12-02 NOTE — Progress Notes (Signed)
ANTICOAGULATION CONSULT NOTE - Initial Consult  Pharmacy Consult for Lovenox/Heparin Indication: VTE prophylaxis  Allergies  Allergen Reactions  . Sulfa Antibiotics Rash    Patient Measurements: Height: 5\' 9"  (175.3 cm) Weight: 180 lb 8.9 oz (81.9 kg) IBW/kg (Calculated) : 70.7 Heparin Dosing Weight:   Vital Signs: Temp: 98.6 F (37 C) (10/10 1953) Temp Source: Oral (10/10 1945) BP: 182/105 mmHg (10/10 2017) Pulse Rate: 93 (10/10 2017)  Labs:  Recent Labs  12/02/14 1448  HGB 11.3*  HCT 34.2*  PLT 214  CREATININE 7.19*  TROPONINI 0.08*    Estimated Creatinine Clearance: 10.9 mL/min (by C-G formula based on Cr of 7.19).   Medical History: Past Medical History  Diagnosis Date  . Hypertension   . Peripheral vascular disease ([place])   . ESRD (end stage renal disease) ([place])     Medications:  Prescriptions prior to admission  Medication Sig Dispense Refill Last Dose  . calcium acetate, Phos Binder, (PHOSLYRA) 667 MG/5ML SOLN Take 2,001 mg by mouth 3 (three) times daily with meals.   12/02/2014 at Unknown time  . cinacalcet (SENSIPAR) 90 MG tablet Take 90 mg by mouth daily.   Past Week at Unknown time  . enalapril (VASOTEC) 10 MG tablet Take 1 tablet (10 mg total) by mouth 2 (two) times daily. 60 tablet 0 12/02/2014 at Unknown time  . hydrALAZINE (APRESOLINE) 25 MG tablet Take 2 tablets (50 mg total) by mouth 3 (three) times daily. 90 tablet 0 12/02/2014 at Unknown time  . loratadine (CLARITIN) 10 MG tablet Take 10 mg by mouth daily.   12/02/2014 at Unknown time    Assessment: CrCl = 10.9 ml/min  Goal of Therapy:  DVT prophylaxis   Plan:  Lovenox 40 mg SQ Q24H originally ordered.  Will d/c lovenox and start Heparin 5000 units SQ Q12H based on CrCl < 15 ml/min.  Rekia Kujala D 12/02/2014,9:07 PM

## 2014-12-02 NOTE — H&P (Addendum)
[facility] NAME: [name]    MR#:  [id number]  DATE OF BIRTH:  05/31/1955  DATE OF ADMISSION:  12/02/2014  PRIMARY CARE PHYSICIAN: Pcp Not In System   REQUESTING/REFERRING PHYSICIAN: [place]  CHIEF COMPLAINT:   Chief Complaint  Patient presents with  . Shortness of Breath    HISTORY OF PRESENT ILLNESS: [name]  is a 59 y.o. male with a known history of end-stage renal disease on Monday Wednesday Friday schedule for hemodialysis, hypertension- went for his routine hemodialysis today but after one and half of her home dialysis he started having burning and pain in his fistula on left forearm surgery told him to stop the dialysis and he went home. But he continued to feel short of breath and tightness in his chest so he came to emergency room for that. Found to have pulmonary edema on x-ray and after talking to nephrologist on call ER physician gave it to hospitalist team for further management. Denies excessive salt or fluid intake in last 2-3 days his last dialysis was on Friday as per his schedule.  PAST MEDICAL HISTORY:   Past Medical History  Diagnosis Date  . Hypertension   . Peripheral vascular disease ([place])   . ESRD (end stage renal disease) ([place])     PAST SURGICAL HISTORY:  Past Surgical History  Procedure Laterality Date  . Av fistula placement Left 2014  . Peripheral vascular catheterization N/A 10/03/2014    Procedure: A/V Shuntogram/Fistulagram;  Surgeon: Algernon Huxley, MD;  Location: [facility];  Service: Cardiovascular;  Laterality: N/A;  . Peripheral vascular catheterization Left 10/03/2014    Procedure: A/V Shunt Intervention;  Surgeon: Algernon Huxley, MD;  Location: [facility];  Service: Cardiovascular;  Laterality: Left;    SOCIAL HISTORY:  Social History  Substance Use Topics  . Smoking status: Current Every Day Smoker -- 0.50 packs/day for 40 years    Types: Cigarettes  .  Smokeless tobacco: Never Used  . Alcohol Use: No    FAMILY HISTORY:  Family History  Problem Relation Age of Onset  . Hypertension Son     DRUG ALLERGIES:  Allergies  Allergen Reactions  . Sulfa Antibiotics Rash    REVIEW OF SYSTEMS:   CONSTITUTIONAL: No fever, fatigue or weakness.  EYES: No blurred or double vision.  EARS, NOSE, AND THROAT: No tinnitus or ear pain.  RESPIRATORY: No cough, positive for shortness of breath,no wheezing or hemoptysis.  CARDIOVASCULAR: No chest pain, orthopnea, edema.  GASTROINTESTINAL: No nausea, vomiting, diarrhea or abdominal pain.  GENITOURINARY: No dysuria, hematuria.  ENDOCRINE: No polyuria, nocturia,  HEMATOLOGY: No anemia, easy bruising or bleeding SKIN: No rash or lesion. MUSCULOSKELETAL: No joint pain or arthritis.   NEUROLOGIC: No tingling, numbness, weakness.  PSYCHIATRY: No anxiety or depression.   MEDICATIONS AT HOME:  Prior to Admission medications   Medication Sig Start Date End Date Taking? Authorizing Provider  calcium acetate, Phos Binder, (PHOSLYRA) 667 MG/5ML SOLN Take 2,001 mg by mouth 3 (three) times daily with meals.   Yes Historical Provider, MD  cinacalcet (SENSIPAR) 90 MG tablet Take 90 mg by mouth daily.   Yes Historical Provider, MD  enalapril (VASOTEC) 10 MG tablet Take 1 tablet (10 mg total) by mouth 2 (two) times daily. 11/12/14  Yes Fritzi Mandes, MD  hydrALAZINE (APRESOLINE) 25 MG tablet Take 2 tablets (50 mg total) by mouth 3 (three) times daily. 11/12/14  Yes  Fritzi Mandes, MD  loratadine (CLARITIN) 10 MG tablet Take 10 mg by mouth daily.   Yes Historical Provider, MD      PHYSICAL EXAMINATION:   VITAL SIGNS: Blood pressure 180/122, pulse 80, temperature 98.2 F (36.8 C), temperature source Oral, resp. rate 20, height 5\' 9"  (1.753 m), weight 81.194 kg (179 lb), SpO2 97 %.  GENERAL:  59 y.o.-year-old patient lying in the bed with no acute distress.  EYES: Pupils equal, round, reactive to light and accommodation.  No scleral icterus. Extraocular muscles intact.  HEENT: Head atraumatic, normocephalic. Oropharynx and nasopharynx clear.  NECK:  Supple, no jugular venous distention. No thyroid enlargement, no tenderness.  LUNGS: Normal breath sounds bilaterally, no wheezing, positive for crepitation. No use of accessory muscles of respiration.  CARDIOVASCULAR: S1, S2 normal. No murmurs, rubs, or gallops.  ABDOMEN: Soft, nontender, nondistended. Bowel sounds present. No organomegaly or mass.  EXTREMITIES: No pedal edema, cyanosis, or clubbing. Left fore arm AV fistula present.  NEUROLOGIC: Cranial nerves II through XII are intact. Muscle strength 5/5 in all extremities. Sensation intact. Gait not checked.  PSYCHIATRIC: The patient is alert and oriented x 3.  SKIN: No obvious rash, lesion, or ulcer.   LABORATORY PANEL:   CBC  Recent Labs Lab 12/02/14 1448  WBC 5.2  HGB 11.3*  HCT 34.2*  PLT 214  MCV 92.6  MCH 30.6  MCHC 33.1  RDW 15.8*   ------------------------------------------------------------------------------------------------------------------  Chemistries   Recent Labs Lab 12/02/14 1448  NA 138  K 3.6  CL 103  CO2 26  GLUCOSE 90  BUN 33*  CREATININE 7.19*  CALCIUM 8.1*  AST 28  ALT 25  ALKPHOS 121  BILITOT 0.8   ------------------------------------------------------------------------------------------------------------------ estimated creatinine clearance is 10.9 mL/min (by C-G formula based on Cr of 7.19). ------------------------------------------------------------------------------------------------------------------ No results for input(s): TSH, T4TOTAL, T3FREE, THYROIDAB in the last 72 hours.  Invalid input(s): FREET3   Coagulation profile No results for input(s): INR, PROTIME in the last 168 hours. ------------------------------------------------------------------------------------------------------------------- No results for input(s): DDIMER in the last 72  hours. -------------------------------------------------------------------------------------------------------------------  Cardiac Enzymes  Recent Labs Lab 12/02/14 1448  TROPONINI 0.08*   ------------------------------------------------------------------------------------------------------------------ Invalid input(s): POCBNP  ---------------------------------------------------------------------------------------------------------------  Urinalysis    Component Value Date/Time   COLORURINE Straw 11/05/2012 2135   APPEARANCEUR Clear 11/05/2012 2135   LABSPEC 1.005 11/05/2012 2135   PHURINE 7.0 11/05/2012 2135   GLUCOSEU 50 mg/dL 11/05/2012 2135   HGBUR 1+ 11/05/2012 2135   BILIRUBINUR Negative 11/05/2012 2135   KETONESUR Negative 11/05/2012 2135   PROTEINUR 25 mg/dL 11/05/2012 2135   NITRITE Negative 11/05/2012 2135   LEUKOCYTESUR Negative 11/05/2012 2135     RADIOLOGY: Dg Chest 2 View  12/02/2014   CLINICAL DATA:  Shortness of breath.  EXAM: CHEST  2 VIEW  COMPARISON:  11/10/2014 .  FINDINGS: Mediastinum hilar structures normal. Cardiomegaly with pulmonary vascular prominence interstitial prominence noted consistent congestive heart failure. Similar findings noted on prior exam. No pleural effusion or pneumothorax .  IMPRESSION: Congestive heart failure with pulmonary interstitial edema. Similar findings noted on prior study of 11/10/2014.   Electronically Signed   By: [place]   On: 12/02/2014 14:33     IMPRESSION AND PLAN:  * Fluid overload and pulmonary edema  Secondary to insufficient hemodialysis today  Nephrology consult was called in by ER physician, follow per their recommendation.  * Hypertension  Blood pressure is under control, continue home medications.  * End-stage renal disease  Manage per nephrology.  * Slight elevation  in troponin  This is secondary to his renal failure no further interventions needed at this point.  * Smoking   Counseled to quit smoking for 4 minutes and offered nicotine patch.  All the records are reviewed and case discussed with ED provider. Management plans discussed with the patient, family and they are in agreement.  CODE STATUS: Full    TOTAL TIME TAKING CARE OF THIS PATIENT: 45 minutes.    Vaughan Basta M.D on 12/02/2014   Between 7am to 6pm - Pager - [phone]  After 6pm go to [email or web address] - password [email or web address] [facility]  Office  [phone]  CC: Primary care physician; Pcp Not In System   Note: This dictation was prepared with Dragon dictation along with smaller phrase technology. Any transcriptional errors that result from this process are unintentional.

## 2014-12-02 NOTE — ED Provider Notes (Signed)
[facility] Emergency Department Provider Note  ____________________________________________  Time seen: On arrival  I have reviewed the triage vital signs and the nursing notes.   HISTORY  Chief Complaint Shortness of Breath    HPI [name] is a 59 y.o. male who presents with complaints of shortness of breath. He reports he woke up this morning with significant shortness of breath. He denies chest pain. He went to dialysis and received only about 1-1/2 hours of dialysis but had to stop because of a burning sensation in his shunt. He was sent to the ED for evaluation. He continues to complain of shortness of breath but reports it didn't improve slightly with dialysis. No fevers no chills. No cough.    Past Medical History  Diagnosis Date  . Hypertension   . Peripheral vascular disease ([place])   . ESRD (end stage renal disease) [facility])     Patient Active Problem List   Diagnosis Date Noted  . CHF (congestive heart failure) ([place]) 11/10/2014    Past Surgical History  Procedure Laterality Date  . Av fistula placement Left 2014  . Peripheral vascular catheterization N/A 10/03/2014    Procedure: A/V Shuntogram/Fistulagram;  Surgeon: Algernon Huxley, MD;  Location: [facility];  Service: Cardiovascular;  Laterality: N/A;  . Peripheral vascular catheterization Left 10/03/2014    Procedure: A/V Shunt Intervention;  Surgeon: Algernon Huxley, MD;  Location: [facility];  Service: Cardiovascular;  Laterality: Left;    Current Outpatient Rx  Name  Route  Sig  Dispense  Refill  . amLODipine (NORVASC) 10 MG tablet   Oral   Take 10 mg by mouth daily.         . cinacalcet (SENSIPAR) 90 MG tablet   Oral   Take 90 mg by mouth daily.         . enalapril (VASOTEC) 10 MG tablet   Oral   Take 1 tablet (10 mg total) by mouth 2 (two) times daily.   60 tablet   0   . hydrALAZINE (APRESOLINE) 25 MG tablet   Oral   Take 2 tablets (50 mg total) by  mouth 3 (three) times daily.   90 tablet   0   . PHOSLYRA 667 MG/5ML SOLN   Oral   Take 5 mLs by mouth 3 (three) times daily with meals.           Dispense as written.     Allergies Sulfa antibiotics  Family History  Problem Relation Age of Onset  . Hypertension Son     Social History Social History  Substance Use Topics  . Smoking status: Current Every Day Smoker -- 0.50 packs/day for 40 years    Types: Cigarettes  . Smokeless tobacco: Never Used  . Alcohol Use: No    Review of Systems  Constitutional: Negative for fever. Eyes: Negative for visual changes. ENT: Negative for sore throat Cardiovascular: Negative for chest pain. Respiratory: Positive for shortness of breath. Gastrointestinal: Negative for abdominal pain, vomiting and diarrhea. Genitourinary: Negative for dysuria. Musculoskeletal: Negative for back pain. Skin: Negative for rash. Neurological: Negative for headaches or focal weakness Psychiatric: Mild anxiety    ____________________________________________   PHYSICAL EXAM:  VITAL SIGNS: ED Triage Vitals  Enc Vitals Group     BP 12/02/14 1222 184/92 mmHg     Pulse Rate 12/02/14 1222 84     Resp 12/02/14 1322 17     Temp 12/02/14 1222 98.2 F (36.8 C)  Temp Source 12/02/14 1222 Oral     SpO2 12/02/14 1222 93 %     Weight 12/02/14 1222 179 lb (81.194 kg)     Height 12/02/14 1222 5\' 9"  (1.753 m)     Head Cir --      Peak Flow --      Pain Score 12/02/14 1309 0     Pain Loc --      Pain Edu? --      Excl. in [place]? --      Constitutional: Alert and oriented. Well appearing and in no distress. Eyes: Conjunctivae are normal.  ENT   Head: Normocephalic and atraumatic.   Mouth/Throat: Mucous membranes are moist. Cardiovascular: Normal rate, regular rhythm. Normal and symmetric distal pulses are present in all extremities. No murmurs, rubs, or gallops. Respiratory: Normal respiratory effort without tachypnea nor retractions.  Minimal Rales bibasilarly  Gastrointestinal: Soft and non-tender in all quadrants. No distention. There is no CVA tenderness. Genitourinary: deferred Musculoskeletal: Nontender with normal range of motion in all extremities. No lower extremity tenderness nor edema. Neurologic:  Normal speech and language. No gross focal neurologic deficits are appreciated. Skin:  Skin is warm, dry and intact. No rash noted. Psychiatric: Mood and affect are normal. Patient exhibits appropriate insight and judgment.  ____________________________________________    LABS (pertinent positives/negatives)  Labs Reviewed  CBC - Abnormal; Notable for the following:    RBC 3.69 (*)    Hemoglobin 11.3 (*)    HCT 34.2 (*)    RDW 15.8 (*)    All other components within normal limits  COMPREHENSIVE METABOLIC PANEL  TROPONIN I    ____________________________________________   EKG ED ECG REPORT I, Lavonia Drafts, the attending physician, personally viewed and interpreted this ECG.   Date: 12/02/2014  EKG Time: 12:20 PM  Rate: 86  Rhythm: normal sinus rhythm, prolonged QT interval  Axis: Normal  Intervals:none  ST&T Change: Nonspecific   ____________________________________________    RADIOLOGY I have personally reviewed any xrays that were ordered on this patient: Chest x-ray shows edema  ____________________________________________   PROCEDURES  Procedure(s) performed: none  Critical Care performed: none  ____________________________________________   INITIAL IMPRESSION / ASSESSMENT AND PLAN / ED COURSE  Pertinent labs & imaging results that were available during my care of the patient were reviewed by me and considered in my medical decision making (see chart for details).  Patient with pulmonary edema on chest x-ray with complaints of shortness of breath. He will likely require admission. I'll discuss with nephrology. Dr. Holley Raring agrees with admission for fluid  overload  ____________________________________________   FINAL CLINICAL IMPRESSION(S) / ED DIAGNOSES  Final diagnoses:  Acute pulmonary edema ([place])     Lavonia Drafts, MD 12/02/14 [phone]

## 2014-12-02 NOTE — ED Notes (Signed)
Pt to triage with c/o shortness of breath that started 2200 last night. Also has productive cough. Hx dialysis M,W,F. Had partial treatment today.

## 2014-12-03 LAB — CBC
HCT: 35.6 % — ABNORMAL LOW (ref 40.0–52.0)
Hemoglobin: 11.9 g/dL — ABNORMAL LOW (ref 13.0–18.0)
MCH: 31.5 pg (ref 26.0–34.0)
MCHC: 33.5 g/dL (ref 32.0–36.0)
MCV: 94.1 fL (ref 80.0–100.0)
PLATELETS: 190 10*3/uL (ref 150–440)
RBC: 3.78 MIL/uL — ABNORMAL LOW (ref 4.40–5.90)
RDW: 15.5 % — AB (ref 11.5–14.5)
WBC: 4.7 10*3/uL (ref 3.8–10.6)

## 2014-12-03 LAB — BASIC METABOLIC PANEL
Anion gap: 9 (ref 5–15)
BUN: 25 mg/dL — AB (ref 6–20)
CALCIUM: 8.1 mg/dL — AB (ref 8.9–10.3)
CO2: 31 mmol/L (ref 22–32)
CREATININE: 6.18 mg/dL — AB (ref 0.61–1.24)
Chloride: 102 mmol/L (ref 101–111)
GFR calc non Af Amer: 9 mL/min — ABNORMAL LOW (ref >60)
GFR, EST AFRICAN AMERICAN: 10 mL/min — AB (ref >60)
GLUCOSE: 99 mg/dL (ref 65–99)
Potassium: 3.9 mmol/L (ref 3.5–5.1)
Sodium: 142 mmol/L (ref 135–145)

## 2014-12-03 NOTE — Progress Notes (Signed)
[facility]  ROUNDING NOTE   Subjective:  Patient well known to [name]. Comes back again with shortness of breath. Signed off early off of dialysis yesterday as outpt. Had similar presentation recently.  Acute pulmonary edema noted on CXR yesterday.  Objective:  Vital signs in last 24 hours:  Temp:  [98 F (36.7 C)-98.6 F (37 C)] 98.3 F (36.8 C) (10/11 1045) Pulse Rate:  [72-93] 75 (10/11 1100) Resp:  [15-26] 18 (10/11 1045) BP: (140-189)/(82-122) 149/99 mmHg (10/11 1100) SpO2:  [87 %-99 %] 99 % (10/11 1100) Weight:  [75.66 kg (166 lb 12.8 oz)-81.9 kg (180 lb 8.9 oz)] 79 kg (174 lb 2.6 oz) (10/11 1045)  Weight change:  Filed Weights   12/02/14 2017 12/03/14 0413 12/03/14 1045  Weight: 75.66 kg (166 lb 12.8 oz) 76.114 kg (167 lb 12.8 oz) 79 kg (174 lb 2.6 oz)    Intake/Output: I/O last 3 completed shifts: In: -  Out: 2100 [Urine:100; Other:2000]   Intake/Output this shift:     Physical Exam: General: NAD  Head: Normocephalic, atraumatic. Moist oral mucosal membranes  Eyes: Anicteric  Neck: Supple, trachea midline  Lungs:  Basilar rales, normal effort  Heart: Regular rate and rhythm S1S2 no rubs  Abdomen:  Soft, nontender, BS present.  Extremities:  trace peripheral edema.  Neurologic: Nonfocal, moving all four extremities  Skin: No lesions  Access: Left UE AVF, area of scab noted overlying access    Basic Metabolic Panel:  Recent Labs Lab 12/02/14 1448 12/03/14 0358  NA 138 142  K 3.6 3.9  CL 103 102  CO2 26 31  GLUCOSE 90 99  BUN 33* 25*  CREATININE 7.19* 6.18*  CALCIUM 8.1* 8.1*    Liver Function Tests:  Recent Labs Lab 12/02/14 1448  AST 28  ALT 25  ALKPHOS 121  BILITOT 0.8  PROT 7.0  ALBUMIN 3.6   No results for input(s): LIPASE, AMYLASE in the last 168 hours. No results for input(s): AMMONIA in the last 168 hours.  CBC:  Recent Labs Lab 12/02/14 1448 12/03/14 0358  WBC 5.2 4.7  HGB 11.3* 11.9*  HCT 34.2* 35.6*   MCV 92.6 94.1  PLT 214 190    Cardiac Enzymes:  Recent Labs Lab 12/02/14 1448  TROPONINI 0.08*    BNP: Invalid input(s): POCBNP  CBG: No results for input(s): GLUCAP in the last 168 hours.  Microbiology: Results for orders placed or performed during the hospital encounter of 11/10/14  MRSA PCR Screening     Status: None   Collection Time: 11/11/14  4:13 PM  Result Value Ref Range Status   MRSA by PCR NEGATIVE NEGATIVE Final    Comment:        The GeneXpert MRSA Assay (FDA approved for NASAL specimens only), is one component of a comprehensive MRSA colonization surveillance program. It is not intended to diagnose MRSA infection nor to guide or monitor treatment for MRSA infections.     Coagulation Studies: No results for input(s): LABPROT, INR in the last 72 hours.  Urinalysis: No results for input(s): COLORURINE, LABSPEC, PHURINE, GLUCOSEU, HGBUR, BILIRUBINUR, KETONESUR, PROTEINUR, UROBILINOGEN, NITRITE, LEUKOCYTESUR in the last 72 hours.  Invalid input(s): APPERANCEUR    Imaging: Dg Chest 2 View  12/02/2014   CLINICAL DATA:  Shortness of breath.  EXAM: CHEST  2 VIEW  COMPARISON:  11/10/2014 .  FINDINGS: Mediastinum hilar structures normal. Cardiomegaly with pulmonary vascular prominence interstitial prominence noted consistent congestive heart failure. Similar findings noted on prior exam.  No pleural effusion or pneumothorax .  IMPRESSION: Congestive heart failure with pulmonary interstitial edema. Similar findings noted on prior study of 11/10/2014.   Electronically Signed   By: Marcello Moores  Register   On: 12/02/2014 14:33     Medications:     . calcium acetate (Phos Binder)  2,001 mg Oral TID WC  . cinacalcet  90 mg Oral Q breakfast  . enalapril  10 mg Oral BID  . heparin subcutaneous  5,000 Units Subcutaneous Q12H  . hydrALAZINE  50 mg Oral TID  . loratadine  10 mg Oral Daily   sodium chloride, sodium chloride, alteplase, heparin, lidocaine (PF),  lidocaine-prilocaine, pentafluoroprop-tetrafluoroeth  Assessment/ Plan:  59 y.o. male with pmhx of hypertension, hepatitis C, ESRD started HD 10/13, anemia of CKD, admission for diarrhea/hyperkalemia 10/2012, admission for GI bleed 10/2012, SHPTH, GI bleed 3/15, seizure episode, admission for pulmonary edema 9/16.  1.  ESRD on HD MWF:  Pt had HD yesterday as he didn't complete treatment, we elected to proceed with HD today as well, UF target today is 1.5kg.  Case discussed with pt and Dr. Bridgett Larsson, will plan for HD again tomorrow as well.  2.  Acute Pulmonary edema.  States he's breathing better today, planning for UF target of 1.5kg today.  3.  Secondary hyperparathyroidism. Taking sensipar while here but has difficulty tolerating as outpt, currently on sensipar and phoslo.    4. Anemia of chronic kidney disease.  No indication for epogen at present.  Monitor CBC.    LOS: 1 [name] 10/11/201611:35 AM

## 2014-12-03 NOTE — Progress Notes (Signed)
Initial Nutrition Assessment    INTERVENTION:   Meals and Snacks: Cater to patient preferences Education: review/reinforce diet on follow-up  NUTRITION DIAGNOSIS:   Reassess on follow-up  GOAL:   Patient will meet greater than or equal to 90% of their needs  MONITOR:    (Energy Intake, Anthropometrics, Electrolyte/Renal Profile, Digestive System, Electrolyte/Renal Profile)  REASON FOR ASSESSMENT:    (Renal Diet)    ASSESSMENT:    Pt admitted with fluid overload and pulmonary edema secondary to insufficient HD after signing off of HD early; pt down for dialysis on visit today, plan for HD again tomorrow per MD notes  Past Medical History  Diagnosis Date  . Hypertension   . Peripheral vascular disease ([place])   . ESRD (end stage renal disease) ([place])     Diet Order:  Diet renal with fluid restriction Fluid restriction:: 1200 mL Fluid; Room service appropriate?: Yes; Fluid consistency:: Thin   Energy Intake: no recorded po intake  Electrolyte and Renal Profile:  Recent Labs Lab 12/02/14 1448 12/03/14 0358  BUN 33* 25*  CREATININE 7.19* 6.18*  NA 138 142  K 3.6 3.9   Glucose Profile: No results for input(s): GLUCAP in the last 72 hours.   Meds: sensipar  Height:   Ht Readings from Last 1 Encounters:  12/02/14 5\' 9"  (1.753 m)    Weight:   Wt Readings from Last 1 Encounters:  12/03/14 170 lb 13.7 oz (77.5 kg)    Filed Weights   12/03/14 0413 12/03/14 1045 12/03/14 1418  Weight: 167 lb 12.8 oz (76.114 kg) 174 lb 2.6 oz (79 kg) 170 lb 13.7 oz (77.5 kg)   Wt Readings from Last 10 Encounters:  12/03/14 170 lb 13.7 oz (77.5 kg)  11/11/14 169 lb 6.4 oz (76.839 kg)    BMI:  Body mass index is 25.22 kg/(m^2).  LOW Care Level  Kerman Passey MS, [place], LDN [phone] Pager

## 2014-12-03 NOTE — Progress Notes (Signed)
Post hd tx 

## 2014-12-03 NOTE — Progress Notes (Signed)
HD tx complete 

## 2014-12-03 NOTE — Progress Notes (Signed)
[facility] [place] NAME: [name]    MR#:  [id number]  DATE OF BIRTH:  [birth year]  SUBJECTIVE:  CHIEF COMPLAINT:   Chief Complaint  Patient presents with  . Shortness of Breath   shortness of breath is better, on hemodialysis now.  REVIEW OF SYSTEMS:  CONSTITUTIONAL: No fever, fatigue or weakness.  EYES: No blurred or double vision.  EARS, NOSE, AND THROAT: No tinnitus or ear pain.  RESPIRATORY: No cough, better shortness of breath, no wheezing or hemoptysis.  CARDIOVASCULAR: No chest pain, orthopnea, edema.  GASTROINTESTINAL: No nausea, vomiting, diarrhea or abdominal pain.  GENITOURINARY: No dysuria, hematuria.  ENDOCRINE: No polyuria, nocturia,  HEMATOLOGY: No anemia, easy bruising or bleeding SKIN: No rash or lesion. MUSCULOSKELETAL: No joint pain or arthritis.   NEUROLOGIC: No tingling, numbness, weakness.  PSYCHIATRY: No anxiety or depression.   DRUG ALLERGIES:   Allergies  Allergen Reactions  . Sulfa Antibiotics Rash    VITALS:  Blood pressure 173/106, pulse 72, temperature 98.3 F (36.8 C), temperature source Oral, resp. rate 17, height 5\' 9"  (1.753 m), weight 79 kg (174 lb 2.6 oz), SpO2 100 %.  PHYSICAL EXAMINATION:  GENERAL:  59 y.o.-year-old patient lying in the bed with no acute distress.  EYES: Pupils equal, round, reactive to light and accommodation. No scleral icterus. Extraocular muscles intact.  HEENT: Head atraumatic, normocephalic. Oropharynx and nasopharynx clear.  NECK:  Supple, no jugular venous distention. No thyroid enlargement, no tenderness.  LUNGS: Normal breath sounds bilaterally, no wheezing, mild basilar rales, no rhonchi or crepitation. No use of accessory muscles of respiration.  CARDIOVASCULAR: S1, S2 normal. No murmurs, rubs, or gallops.  ABDOMEN: Soft, nontender, nondistended. Bowel sounds present. No organomegaly or mass.  EXTREMITIES: No pedal edema, cyanosis, or clubbing.   NEUROLOGIC: Cranial nerves II through XII are intact. Muscle strength 5/5 in all extremities. Sensation intact. Gait not checked.  PSYCHIATRIC: The patient is alert and oriented x 3.  SKIN: No obvious rash, lesion, or ulcer.    LABORATORY PANEL:   CBC  Recent Labs Lab 12/03/14 0358  WBC 4.7  HGB 11.9*  HCT 35.6*  PLT 190   ------------------------------------------------------------------------------------------------------------------  Chemistries   Recent Labs Lab 12/02/14 1448 12/03/14 0358  NA 138 142  K 3.6 3.9  CL 103 102  CO2 26 31  GLUCOSE 90 99  BUN 33* 25*  CREATININE 7.19* 6.18*  CALCIUM 8.1* 8.1*  AST 28  --   ALT 25  --   ALKPHOS 121  --   BILITOT 0.8  --    ------------------------------------------------------------------------------------------------------------------  Cardiac Enzymes  Recent Labs Lab 12/02/14 1448  TROPONINI 0.08*   ------------------------------------------------------------------------------------------------------------------  RADIOLOGY:  Dg Chest 2 View  12/02/2014   CLINICAL DATA:  Shortness of breath.  EXAM: CHEST  2 VIEW  COMPARISON:  11/10/2014 .  FINDINGS: Mediastinum hilar structures normal. Cardiomegaly with pulmonary vascular prominence interstitial prominence noted consistent congestive heart failure. Similar findings noted on prior exam. No pleural effusion or pneumothorax .  IMPRESSION: Congestive heart failure with pulmonary interstitial edema. Similar findings noted on prior study of 11/10/2014.   Electronically Signed   By: Marcello Moores  Register   On: 12/02/2014 14:33    EKG:   Orders placed or performed during the hospital encounter of 12/02/14  . EKG 12-Lead  . EKG 12-Lead    ASSESSMENT AND PLAN:   * Fluid overload and pulmonary edema, Secondary to insufficient hemodialysis. Continue hemodialysis today and  tomorrow per Dr. Holley Raring.  * Hypertension Blood pressure is under control, continue home  medications.  * End-stage renal disease Continue hemodialysis today and tomorrow.  * Slight elevation in troponin This is secondary to his renal failure no further interventions needed at this point.  * Smoking Counseled to quit smoking for 4 minutes and offered nicotine patch.    All the records are reviewed and case discussed with Care Management/Social Workerr. Management plans discussed with the patient, family and they are in agreement.  CODE STATUS: full code  TOTAL TIME TAKING CARE OF THIS PATIENT: 36 minutes.   POSSIBLE D/C IN 2 DAYS, DEPENDING ON CLINICAL CONDITION.   Demetrios Loll M.D on 12/03/2014 at 1:34 PM  Between 7am to 6pm - Pager - [phone]  After 6pm go to [email or web address] - password [email or web address] [facility]  Office  [phone]  CC: Primary care physician; Pcp Not In System

## 2014-12-03 NOTE — Progress Notes (Signed)
Pre-hd tx 

## 2014-12-03 NOTE — Progress Notes (Signed)
Admission skin assessment with Gelene Mink, RN

## 2014-12-04 LAB — HEPATITIS B SURFACE ANTIGEN: Hepatitis B Surface Ag: NEGATIVE

## 2014-12-04 NOTE — Progress Notes (Signed)
[facility]  ROUNDING NOTE   Subjective:  Patient completed HD today. Tolerated well. No shortness of breath at present.  Objective:  Vital signs in last 24 hours:  Temp:  [97.8 F (36.6 C)-98.4 F (36.9 C)] 98.4 F (36.9 C) (10/12 1337) Pulse Rate:  [65-84] 75 (10/12 1337) Resp:  [11-21] 18 (10/12 1337) BP: (111-175)/(76-106) 151/81 mmHg (10/12 1337) SpO2:  [95 %-100 %] 97 % (10/12 1337) Weight:  [73.8 kg (162 lb 11.2 oz)-77.7 kg (171 lb 4.8 oz)] 73.8 kg (162 lb 11.2 oz) (10/12 1355)  Weight change: -2.194 kg (-4 lb 13.4 oz) Filed Weights   12/03/14 1418 12/04/14 0945 12/04/14 1355  Weight: 77.5 kg (170 lb 13.7 oz) 77.7 kg (171 lb 4.8 oz) 73.8 kg (162 lb 11.2 oz)    Intake/Output: I/O last 3 completed shifts: In: 240 [P.O.:240] Out: [id number] [Urine:325; Other:3500]   Intake/Output this shift:  Total I/O In: -  Out: 3000 [Other:3000]  Physical Exam: General: NAD  Head: Normocephalic, atraumatic. Moist oral mucosal membranes  Eyes: Anicteric  Neck: Supple, trachea midline  Lungs:  CTAB, normal effort  Heart: Regular rate and rhythm S1S2 no rubs  Abdomen:  Soft, nontender, BS present.  Extremities:  trace peripheral edema.  Neurologic: Nonfocal, moving all four extremities  Skin: No lesions  Access: Left UE AVF, area of scab noted overlying access    Basic Metabolic Panel:  Recent Labs Lab 12/02/14 1448 12/03/14 0358  NA 138 142  K 3.6 3.9  CL 103 102  CO2 26 31  GLUCOSE 90 99  BUN 33* 25*  CREATININE 7.19* 6.18*  CALCIUM 8.1* 8.1*    Liver Function Tests:  Recent Labs Lab 12/02/14 1448  AST 28  ALT 25  ALKPHOS 121  BILITOT 0.8  PROT 7.0  ALBUMIN 3.6   No results for input(s): LIPASE, AMYLASE in the last 168 hours. No results for input(s): AMMONIA in the last 168 hours.  CBC:  Recent Labs Lab 12/02/14 1448 12/03/14 0358  WBC 5.2 4.7  HGB 11.3* 11.9*  HCT 34.2* 35.6*  MCV 92.6 94.1  PLT 214 190    Cardiac  Enzymes:  Recent Labs Lab 12/02/14 1448  TROPONINI 0.08*    BNP: Invalid input(s): POCBNP  CBG: No results for input(s): GLUCAP in the last 168 hours.  Microbiology: Results for orders placed or performed during the hospital encounter of 11/10/14  MRSA PCR Screening     Status: None   Collection Time: 11/11/14  4:13 PM  Result Value Ref Range Status   MRSA by PCR NEGATIVE NEGATIVE Final    Comment:        The GeneXpert MRSA Assay (FDA approved for NASAL specimens only), is one component of a comprehensive MRSA colonization surveillance program. It is not intended to diagnose MRSA infection nor to guide or monitor treatment for MRSA infections.     Coagulation Studies: No results for input(s): LABPROT, INR in the last 72 hours.  Urinalysis: No results for input(s): COLORURINE, LABSPEC, PHURINE, GLUCOSEU, HGBUR, BILIRUBINUR, KETONESUR, PROTEINUR, UROBILINOGEN, NITRITE, LEUKOCYTESUR in the last 72 hours.  Invalid input(s): APPERANCEUR    Imaging: No results found.   Medications:     . calcium acetate (Phos Binder)  2,001 mg Oral TID WC  . cinacalcet  90 mg Oral Q breakfast  . enalapril  10 mg Oral BID  . heparin subcutaneous  5,000 Units Subcutaneous Q12H  . hydrALAZINE  50 mg Oral TID  . loratadine  10  mg Oral Daily   sodium chloride, sodium chloride, alteplase, heparin, lidocaine (PF), lidocaine-prilocaine, pentafluoroprop-tetrafluoroeth  Assessment/ Plan:  59 y.o. male with pmhx of hypertension, hepatitis C, ESRD started HD 10/13, anemia of CKD, admission for diarrhea/hyperkalemia 10/2012, admission for GI bleed 10/2012, SHPTH, GI bleed 3/15, seizure episode, admission for pulmonary edema 9/16.  1.  ESRD on HD MWF:  Pt had HD today, consecutive days of dialysis has helped him, advised pt to adhere to dialysis time and schedule.  Verbalized understanding.  2.  Acute Pulmonary edema.  Improved with ultrafiltration since admission, will continue with HD as  outpt.  3.  Secondary hyperparathyroidism. Last phos 6.2, will need to continue to monitor phos as outpt.  Continue phoslo 2001mg  po tid/wm.   4. Anemia of chronic kidney disease.  hgb likely up due to aggressive UF.  Monitor CBC as outpt.    LOS: 2 [name] 10/12/20162:29 PM

## 2014-12-04 NOTE — Progress Notes (Signed)
Pre-hd tx 

## 2014-12-04 NOTE — Discharge Summary (Signed)
[place] [facility] [place] NAME: [name]    MR#:  [id number]  DATE OF BIRTH:  August 25, 1955  DATE OF ADMISSION:  12/02/2014 ADMITTING PHYSICIAN: Vaughan Basta, MD  DATE OF DISCHARGE: 12/04/2014  2:52 PM  PRIMARY CARE PHYSICIAN: Pcp Not In System    ADMISSION DIAGNOSIS:  Acute pulmonary edema ([place]) [J81.0]   DISCHARGE DIAGNOSIS:  Fluid overload and pulmonary edema, Secondary to insufficient hemodialysis.  SECONDARY DIAGNOSIS:   Past Medical History  Diagnosis Date  . Hypertension   . Peripheral vascular disease ([place])   . ESRD (end stage renal disease) [facility])     HOSPITAL COURSE:   Fluid overload and pulmonary edema, Secondary to insufficient hemodialysis. The patient got hemodialysis yesterday. His symptoms has much improved.  * Hypertension Blood pressure is under control, continue home medications.  * End-stage renal disease Continue hemodialysis today and tomorrow.  * Slight elevation in troponin This is secondary to his renal failure no further interventions needed at this point.  * Smoking Counseled to quit smoking for 4 minutes and offered nicotine patch.   DISCHARGE CONDITIONS:   Stable, discharged to home today.  CONSULTS OBTAINED:  Treatment Team:  Demetrios Loll, MD Anthonette Legato, MD  DRUG ALLERGIES:   Allergies  Allergen Reactions  . Sulfa Antibiotics Rash    DISCHARGE MEDICATIONS:   Discharge Medication List as of 12/04/2014  2:20 PM    CONTINUE these medications which have NOT CHANGED   Details  calcium acetate, Phos Binder, (PHOSLYRA) 667 MG/5ML SOLN Take 2,001 mg by mouth 3 (three) times daily with meals., Until Discontinued, Historical Med    cinacalcet (SENSIPAR) 90 MG tablet Take 90 mg by mouth daily., Until Discontinued, Historical Med    enalapril (VASOTEC) 10 MG tablet Take 1 tablet (10 mg total) by mouth 2 (two) times daily., Starting 11/12/2014, Until Discontinued,  Normal    hydrALAZINE (APRESOLINE) 25 MG tablet Take 2 tablets (50 mg total) by mouth 3 (three) times daily., Starting 11/12/2014, Until Discontinued, Normal    loratadine (CLARITIN) 10 MG tablet Take 10 mg by mouth daily., Until Discontinued, Historical Med         DISCHARGE INSTRUCTIONS:   If you experience worsening of your admission symptoms, develop shortness of breath, life threatening emergency, suicidal or homicidal thoughts you must seek medical attention immediately by calling 911 or calling your MD immediately  if symptoms less severe.  You Must read complete instructions/literature along with all the possible adverse reactions/side effects for all the Medicines you take and that have been prescribed to you. Take any new Medicines after you have completely understood and accept all the possible adverse reactions/side effects.   Please note  You were cared for by a hospitalist during your hospital stay. If you have any questions about your discharge medications or the care you received while you were in the hospital after you are discharged, you can call the unit and asked to speak with the hospitalist on call if the hospitalist that took care of you is not available. Once you are discharged, your primary care physician will handle any further medical issues. Please note that NO REFILLS for any discharge medications will be authorized once you are discharged, as it is imperative that you return to your primary care physician (or establish a relationship with a primary care physician if you do not have one) for your aftercare needs so that they can reassess your need for medications and monitor  your lab values.    Today   SUBJECTIVE   No complaint.   VITAL SIGNS:  Blood pressure 151/81, pulse 75, temperature 98.4 F (36.9 C), temperature source Oral, resp. rate 18, height 5\' 9"  (1.753 m), weight 73.8 kg (162 lb 11.2 oz), SpO2 97 %.  I/O:   Intake/Output Summary (Last 24  hours) at 12/04/14 1543 Last data filed at 12/04/14 1316  Gross per 24 hour  Intake    240 ml  Output   3225 ml  Net  -2985 ml    PHYSICAL EXAMINATION:  GENERAL:  59 y.o.-year-old patient lying in the bed with no acute distress.  EYES: Pupils equal, round, reactive to light and accommodation. No scleral icterus. Extraocular muscles intact.  HEENT: Head atraumatic, normocephalic. Oropharynx and nasopharynx clear. Moist oral mucosa. NECK:  Supple, no jugular venous distention. No thyroid enlargement, no tenderness.  LUNGS: Normal breath sounds bilaterally, no wheezing, rales,rhonchi or crepitation. No use of accessory muscles of respiration.  CARDIOVASCULAR: S1, S2 normal. No murmurs, rubs, or gallops.  ABDOMEN: Soft, non-tender, non-distended. Bowel sounds present. No organomegaly or mass.  EXTREMITIES: No pedal edema, cyanosis, or clubbing.  NEUROLOGIC: Cranial nerves II through XII are intact. Muscle strength 5/5 in all extremities. Sensation intact. Gait not checked.  PSYCHIATRIC: The patient is alert and oriented x 3.  SKIN: No obvious rash, lesion, or ulcer.   DATA REVIEW:   CBC  Recent Labs Lab 12/03/14 0358  WBC 4.7  HGB 11.9*  HCT 35.6*  PLT 190    Chemistries   Recent Labs Lab 12/02/14 1448 12/03/14 0358  NA 138 142  K 3.6 3.9  CL 103 102  CO2 26 31  GLUCOSE 90 99  BUN 33* 25*  CREATININE 7.19* 6.18*  CALCIUM 8.1* 8.1*  AST 28  --   ALT 25  --   ALKPHOS 121  --   BILITOT 0.8  --     Cardiac Enzymes  Recent Labs Lab 12/02/14 1448  TROPONINI 0.08*    Microbiology Results  Results for orders placed or performed during the hospital encounter of 11/10/14  MRSA PCR Screening     Status: None   Collection Time: 11/11/14  4:13 PM  Result Value Ref Range Status   MRSA by PCR NEGATIVE NEGATIVE Final    Comment:        The GeneXpert MRSA Assay (FDA approved for NASAL specimens only), is one component of a comprehensive MRSA  colonization surveillance program. It is not intended to diagnose MRSA infection nor to guide or monitor treatment for MRSA infections.     RADIOLOGY:  No results found.      Management plans discussed with the patient, family and they are in agreement.  CODE STATUS:     Code Status Orders        Start     Ordered   12/02/14 2052  Full code   Continuous     12/02/14 2051      TOTAL TIME TAKING CARE OF THIS PATIENT: 33 minutes.    Demetrios Loll M.D on 12/04/2014 at 3:43 PM  Between 7am to 6pm - Pager - [phone]  After 6pm go to [email or web address] - password [email or web address] [facility]  Office  [phone]  CC: Primary care physician; Pcp Not In System

## 2014-12-04 NOTE — Care Management Important Message (Signed)
Important Message  Patient Details  Name: [name] MRN: [id number] Date of Birth: 12/18/1955   Medicare Important Message Given:  Yes-second notification given    Katrina Stack, RN 12/04/2014, 9:39 AM

## 2014-12-04 NOTE — Progress Notes (Signed)
Patient has no acute event overnight. He remained NSR on the monitor, VS WDL for patient. He remained hemodynamically stable through the night.

## 2014-12-04 NOTE — Discharge Instructions (Signed)
Renal diet. Activity as tolerated.   [facility] Failure [facility] appointment on December 19, 2014 at 11:00am with Darylene Price, [place]. Please call [phone] to reschedule.

## 2014-12-04 NOTE — Progress Notes (Signed)
HD tx start 

## 2014-12-04 NOTE — Care Management (Signed)
Spoke with attending and he does not feel that patient is in need of any services.  Discussed home health due to readmission and informed that patient does not meet homebound criteria.

## 2014-12-04 NOTE — Progress Notes (Signed)
Initial appointment was made at the [facility] on December 19, 2014 at 11:00am. Thank you.

## 2014-12-19 ENCOUNTER — Telehealth: Payer: Self-pay | Admitting: Family

## 2014-12-19 ENCOUNTER — Ambulatory Visit: Payer: Medicare Other | Admitting: Family

## 2014-12-19 NOTE — Telephone Encounter (Signed)
Patient did not show for his initial appointment at the [facility] on 12/19/14. Will attempt to reschedule.

## 2015-04-15 ENCOUNTER — Other Ambulatory Visit: Payer: Self-pay | Admitting: Vascular Surgery

## 2015-05-01 ENCOUNTER — Encounter
Admission: RE | Disposition: A | Discharge status: Home / Self Care | Payer: Self-pay | Source: Ambulatory Visit | Attending: Vascular Surgery

## 2015-05-01 ENCOUNTER — Ambulatory Visit
Admission: RE | Admit: 2015-05-01 | Discharge: 2015-05-01 | Disposition: A | Discharge status: Home / Self Care | Payer: Medicare Other | Source: Ambulatory Visit | Attending: Vascular Surgery | Admitting: Vascular Surgery

## 2015-05-01 ENCOUNTER — Encounter: Payer: Self-pay | Admitting: *Deleted

## 2015-05-01 DIAGNOSIS — I120 Hypertensive chronic kidney disease with stage 5 chronic kidney disease or end stage renal disease: Secondary | ICD-10-CM | POA: Insufficient documentation

## 2015-05-01 DIAGNOSIS — N186 End stage renal disease: Secondary | ICD-10-CM | POA: Insufficient documentation

## 2015-05-01 DIAGNOSIS — Y832 Surgical operation with anastomosis, bypass or graft as the cause of abnormal reaction of the patient, or of later complication, without mention of misadventure at the time of the procedure: Secondary | ICD-10-CM | POA: Insufficient documentation

## 2015-05-01 DIAGNOSIS — I739 Peripheral vascular disease, unspecified: Secondary | ICD-10-CM | POA: Diagnosis not present

## 2015-05-01 DIAGNOSIS — Z8249 Family history of ischemic heart disease and other diseases of the circulatory system: Secondary | ICD-10-CM | POA: Insufficient documentation

## 2015-05-01 DIAGNOSIS — Z87891 Personal history of nicotine dependence: Secondary | ICD-10-CM | POA: Insufficient documentation

## 2015-05-01 DIAGNOSIS — Z992 Dependence on renal dialysis: Secondary | ICD-10-CM | POA: Insufficient documentation

## 2015-05-01 DIAGNOSIS — T82898A Other specified complication of vascular prosthetic devices, implants and grafts, initial encounter: Secondary | ICD-10-CM | POA: Insufficient documentation

## 2015-05-01 DIAGNOSIS — Z882 Allergy status to sulfonamides status: Secondary | ICD-10-CM | POA: Insufficient documentation

## 2015-05-01 HISTORY — PX: PERIPHERAL VASCULAR CATHETERIZATION: SHX172C

## 2015-05-01 SURGERY — A/V SHUNTOGRAM/FISTULAGRAM
Anesthesia: Moderate Sedation

## 2015-05-01 MED ORDER — MIDAZOLAM HCL 2 MG/2ML IJ SOLN
INTRAMUSCULAR | Status: DC | PRN
  Administered 2015-05-01: 2 mg via INTRAVENOUS

## 2015-05-01 MED ORDER — SODIUM CHLORIDE 0.9 % IV SOLN: INTRAVENOUS | Status: DC

## 2015-05-01 MED ORDER — HEPARIN SODIUM (PORCINE) 1000 UNIT/ML IJ SOLN
INTRAMUSCULAR | Status: DC | PRN
  Administered 2015-05-01: 3000 [IU] via INTRAVENOUS

## 2015-05-01 MED ORDER — FENTANYL CITRATE (PF) 100 MCG/2ML IJ SOLN
INTRAMUSCULAR | Status: DC | PRN
  Administered 2015-05-01: 50 ug via INTRAVENOUS

## 2015-05-01 MED ORDER — IOHEXOL 300 MG/ML  SOLN
INTRAMUSCULAR | Status: DC | PRN
Start: 2015-05-01 — End: 2015-05-01
  Administered 2015-05-01: 25 mL via INTRAVENOUS

## 2015-05-01 MED ORDER — LIDOCAINE-EPINEPHRINE (PF) 1 %-1:200000 IJ SOLN
INTRAMUSCULAR | Status: AC
  Filled 2015-05-01: qty 30

## 2015-05-01 MED ORDER — METHYLPREDNISOLONE SODIUM SUCC 125 MG IJ SOLR: 125.0000 mg | INTRAMUSCULAR | Status: DC | PRN

## 2015-05-01 MED ORDER — DEXTROSE 5 % IV SOLN
1.5000 g | INTRAVENOUS | Status: AC
  Administered 2015-05-01: 1.5 g via INTRAVENOUS

## 2015-05-01 MED ORDER — HEPARIN (PORCINE) IN NACL 2-0.9 UNIT/ML-% IJ SOLN
INTRAMUSCULAR | Status: AC
  Filled 2015-05-01: qty 1000

## 2015-05-01 MED ORDER — HEPARIN SODIUM (PORCINE) 1000 UNIT/ML IJ SOLN
INTRAMUSCULAR | Status: AC
  Filled 2015-05-01: qty 1

## 2015-05-01 MED ORDER — MIDAZOLAM HCL 5 MG/5ML IJ SOLN
INTRAMUSCULAR | Status: AC
  Filled 2015-05-01: qty 5

## 2015-05-01 MED ORDER — DEXTROSE 5 % IV SOLN
INTRAVENOUS | Status: AC
  Filled 2015-05-01 (×44): qty 1.5

## 2015-05-01 MED ORDER — FAMOTIDINE 20 MG PO TABS: 40.0000 mg | ORAL_TABLET | ORAL | Status: DC | PRN

## 2015-05-01 MED ORDER — HYDROMORPHONE HCL 1 MG/ML IJ SOLN: 1.0000 mg | Freq: Once | INTRAMUSCULAR | Status: DC

## 2015-05-01 MED ORDER — FENTANYL CITRATE (PF) 100 MCG/2ML IJ SOLN
INTRAMUSCULAR | Status: AC
  Filled 2015-05-01: qty 2

## 2015-05-01 MED ORDER — ONDANSETRON HCL 4 MG/2ML IJ SOLN: 4.0000 mg | Freq: Four times a day (QID) | INTRAMUSCULAR | Status: DC | PRN

## 2015-05-01 SURGICAL SUPPLY — 10 items
CANNULA 5F STIFF (CANNULA) ×4 IMPLANT
CATH KA2 5FR 65CM (CATHETERS) ×4 IMPLANT
DEVICE PRESTO INFLATION (MISCELLANEOUS) ×4 IMPLANT
DRAPE BRACHIAL (DRAPES) ×4 IMPLANT
GLIDEWIRE ADV .035X180CM (WIRE) ×4 IMPLANT
GLIDEWIRE ADV .035X260CM (WIRE) ×4 IMPLANT
GLIDEWIRE GOLD 45 ANG .018X180 (MISCELLANEOUS) ×4 IMPLANT
PACK ANGIOGRAPHY (CUSTOM PROCEDURE TRAY) ×4 IMPLANT
SHEATH BRITE TIP 6FRX5.5 (SHEATH) ×4 IMPLANT
TOWEL OR 17X26 4PK STRL BLUE (TOWEL DISPOSABLE) ×4 IMPLANT

## 2015-05-01 NOTE — H&P (Signed)
[facility] Admission History & Physical  MRN : [id number]  Pasqualino Afridi is a 60 y.o. (Nov 21, 1955) male who presents with chief complaint of No chief complaint on file. [name]  History of Present Illness: Patient is sent over from his dialysis center for evaluation of his left arm AV fistula. This has been his dialysis access for several years now. It was first placed into [facility]. They have had diminished flows with some difficulties of access recently. He reports no pain. He reports no fever or chills or signs systemic infection. He has still been getting his dialysis treatments regularly. He is in his usual state of health.  Current Facility-Administered Medications  Medication Dose Route Frequency Provider Last Rate Last Dose  . 0.9 %  sodium chloride infusion   Intravenous Continuous Kimberly A Stegmayer, PA-C      . cefUROXime (ZINACEF) 1.5 g in dextrose 5 % 50 mL IVPB  1.5 g Intravenous 30 min Pre-Op Kimberly A Stegmayer, PA-C      . famotidine (PEPCID) tablet 40 mg  40 mg Oral PRN Janalyn Harder Stegmayer, PA-C      . HYDROmorphone (DILAUDID) injection 1 mg  1 mg Intravenous Once [employer], PA-C      . methylPREDNISolone sodium succinate (SOLU-MEDROL) 125 mg/2 mL injection 125 mg  125 mg Intravenous PRN Kimberly A Stegmayer, PA-C      . ondansetron (ZOFRAN) injection 4 mg  4 mg Intravenous Q6H PRN Sela Hua, PA-C        Past Medical History  Diagnosis Date  . Hypertension   . Peripheral vascular disease ([place])   . ESRD (end stage renal disease) [facility])     Past Surgical History  Procedure Laterality Date  . Av fistula placement Left 2014  . Peripheral vascular catheterization N/A 10/03/2014    Procedure: A/V Shuntogram/Fistulagram;  Surgeon: Algernon Huxley, MD;  Location: [place] [facility];  Service: Cardiovascular;  Laterality: N/A;  . Peripheral vascular catheterization Left 10/03/2014    Procedure: A/V Shunt Intervention;  Surgeon:  Algernon Huxley, MD;  Location: [place] [facility];  Service: Cardiovascular;  Laterality: Left;    Social History Social History  Substance Use Topics  . Smoking status: Former Smoker -- 0.50 packs/day for 40 years    Types: Cigarettes    Quit date: 04/17/2014  . Smokeless tobacco: Never Used  . Alcohol Use: No   no IV drug use  Family History Family History  Problem Relation Age of Onset  . Hypertension Son    no family history of bleeding disorders, clotting disorders, or autoimmune diseases  Allergies  Allergen Reactions  . Sulfa Antibiotics Rash     REVIEW OF SYSTEMS (Negative unless checked)  Constitutional: [] Weight loss  [] Fever  [] Chills Cardiac: [] Chest pain   [] Chest pressure   [] Palpitations   [] Shortness of breath when laying flat   [] Shortness of breath at rest   [] Shortness of breath with exertion. Vascular:  [] Pain in legs with walking   [] Pain in legs at rest   [] Pain in legs when laying flat   [] Claudication   [] Pain in feet when walking  [] Pain in feet at rest  [] Pain in feet when laying flat   [] History of DVT   [] Phlebitis   [x] Swelling in legs   [] Varicose veins   [] Non-healing ulcers Pulmonary:   [] Uses home oxygen   [] Productive cough   [] Hemoptysis   [] Wheeze  [] COPD   [] Asthma  Neurologic:  [] Dizziness  [] Blackouts   [] Seizures   [] History of stroke   [] History of TIA  [] Aphasia   [] Temporary blindness   [] Dysphagia   [] Weakness or numbness in arms   [] Weakness or numbness in legs Musculoskeletal:  [] Arthritis   [] Joint swelling   [] Joint pain   [] Low back pain Hematologic:  [] Easy bruising  [] Easy bleeding   [] Hypercoagulable state   [] Anemic  [] Hepatitis Gastrointestinal:  [] Blood in stool   [] Vomiting blood  [] Gastroesophageal reflux/heartburn   [] Difficulty swallowing. Genitourinary:  [x] Chronic kidney disease   [] Difficult urination  [] Frequent urination  [] Burning with urination   [] Blood in urine Skin:  [] Rashes   [] Ulcers   [] Wounds Psychological:   [] History of anxiety   []  History of major depression.  Physical Examination  Filed Vitals:   05/01/15 0706 05/01/15 0720  BP:  165/83  Pulse: 80   Temp: 97.8 F (36.6 C)   TempSrc: Oral   Height: 5\' 8"  (1.727 m)   Weight: 79.833 kg (176 lb)   SpO2: 98%    Body mass index is 26.77 kg/(m^2). Gen: WD/WN, NAD Head: [place]/AT, No temporalis wasting. Prominent temp pulse not noted. Ear/Nose/Throat: Hearing grossly intact, nares w/o erythema or drainage, oropharynx w/o Erythema/Exudate,  Eyes: PERRLA, EOMI.  Neck: Supple, no nuchal rigidity.  No JVD.  Pulmonary:  Good air movement, no use of accessory muscles.  Cardiac: RRR, normal S1, S2 Vascular: Left arm AV fistula present with good thrill and bruit but somewhat pulsatile Vessel Right Left  Radial Palpable Palpable                                   Gastrointestinal: soft, non-tender/non-distended. No guarding/reflex.  Musculoskeletal: M/S 5/5 throughout.  Extremities without ischemic changes.  No deformity or atrophy.  Neurologic: CN 2-12 intact. Pain and light touch intact in extremities.  Symmetrical.  Speech is fluent. Motor exam as listed above. Psychiatric: Judgment intact, Mood & affect appropriate for pt's clinical situation. Dermatologic: No rashes or ulcers noted.  No cellulitis or open wounds. Lymph : No Cervical, Axillary, or Inguinal lymphadenopathy.      CBC Lab Results  Component Value Date   WBC 4.7 12/03/2014   HGB 11.9* 12/03/2014   HCT 35.6* 12/03/2014   MCV 94.1 12/03/2014   PLT 190 12/03/2014    BMET    Component Value Date/Time   NA 142 12/03/2014 0358   NA 139 04/30/2013 1203   K 3.9 12/03/2014 0358   K 4.4 04/30/2013 1203   CL 102 12/03/2014 0358   CL 107 04/30/2013 1203   CO2 31 12/03/2014 0358   CO2 25 04/30/2013 1203   GLUCOSE 99 12/03/2014 0358   GLUCOSE 104* 04/30/2013 1203   BUN 25* 12/03/2014 0358   BUN 76* 04/30/2013 1203   CREATININE 6.18* 12/03/2014 0358   CREATININE  9.49* 04/30/2013 1203   CALCIUM 8.1* 12/03/2014 0358   CALCIUM 8.0* 04/30/2013 1203   GFRNONAA 9* 12/03/2014 0358   GFRNONAA 5* 04/30/2013 1203   GFRAA 10* 12/03/2014 0358   GFRAA 6* 04/30/2013 1203   CrCl cannot be calculated (Patient has no serum creatinine result on file.).  COAG Lab Results  Component Value Date   INR 1.1 04/26/2013   INR 1.0 07/19/2012   INR 1.2 12/12/2011    Radiology No results found.    Assessment/Plan 1. Dysfunction of dialysis access.  For fistulogram today. Risks and  benefits discussed. 2. End-stage renal disease. Long-standing dialysis dependence. Needs the access to have better function. 3. Hypertension. Stable. On outpatient medications.   Caden Fatica, MD  05/01/2015 8:35 AM

## 2015-05-01 NOTE — Op Note (Signed)
[facility]    OPERATIVE NOTE   PROCEDURE: 1.   Left radiocephalic arteriovenous fistula cannulation under ultrasound guidance 2.   Left arm fistulagram including central venogram   PRE-OPERATIVE DIAGNOSIS: 1. ESRD 2. Poorly functional left radiocephalic AVF  POST-OPERATIVE DIAGNOSIS: same as above   SURGEON: Leotis Pain, MD  ANESTHESIA: local with MCS  ESTIMATED BLOOD LOSS: 30 cc  FINDING(S): 1. Short segment occlusion of the left innominate vein. A large plexus of small venous branches in the proximal forearm draining the fistula to the basilic vein in the upper arm.  SPECIMEN(S):  None  CONTRAST: 25 cc  FLUORO TIME: 6.3 minutes  MODERATE CONSCIOUS SEDATION TIME: Approximately 30 minutes with 2 mg of Versed and 50 mcg of Fentanyl   INDICATIONS: [name] is a 60 y.o. male who presents with malfunctioning  left radiocephalic arteriovenous fistula.  The patient is scheduled for  left arm fistulagram.  The patient is aware the risks include but are not limited to: bleeding, infection, thrombosis of the cannulated access, and possible anaphylactic reaction to the contrast.  The patient is aware of the risks of the procedure and elects to proceed forward.  DESCRIPTION: After full informed written consent was obtained, the patient was brought back to the angiography suite and placed supine upon the angiography table.  The patient was connected to monitoring equipment. Moderate conscious sedation was administered with a face to face encounter with the patient throughout the procedure with my supervision of the RN administering medicines and monitoring the patient's vital signs and mental status throughout from the start of the procedure until the patient was taken to the recovery room. The  left arm was prepped and draped in the standard fashion for a percutaneous access intervention.  Under ultrasound guidance, the  left radiocephalic arteriovenous fistula  was cannulated with a micropuncture needle under direct ultrasound guidance and a permanent image was performed.  The microwire was advanced into the fistula and the needle was exchanged for the a microsheath.  I then upsized to a 6 Fr Sheath and imaging was performed.  Hand injections were completed to image the access including the central venous system. This demonstrated short segment occlusion of the left innominate vein. A large plexus of small venous branches in the proximal forearm draining the fistula to the basilic vein in the upper arm. I then gave the patient 3000 units of intravenous heparin.  Attempts at crossing the plexus of vein branches in the proximal forearm were not successful due to the extensive tortuosity and small nature of the venous branches that drained to the basilic vein. I did not feel that even if were able to pass this that any intervention would be reasonably durable in this location. At this point, I will discuss with the patient a surgical revision of his fistula if he desires. PermCath may be necessary and I would likely try to address the central venous issue at that time, but if his fistula is running somewhat well he can use it for now area  A 4-0 Monocryl purse-string suture was sewn around the sheath.  The sheath was removed while tying down the suture.  A sterile bandage was applied to the puncture site.  COMPLICATIONS: None  CONDITION: Stable   DEW,JASON  05/01/2015 9:34 AM

## 2015-05-02 ENCOUNTER — Encounter: Payer: Self-pay | Admitting: Vascular Surgery

## 2015-06-05 ENCOUNTER — Emergency Department
Admission: EM | Admit: 2015-06-05 | Discharge: 2015-06-05 | Disposition: A | Discharge status: Home / Self Care | Payer: Medicare Other | Attending: Emergency Medicine | Admitting: Emergency Medicine

## 2015-06-05 ENCOUNTER — Emergency Department: Payer: Medicare Other

## 2015-06-05 ENCOUNTER — Encounter: Payer: Self-pay | Admitting: Emergency Medicine

## 2015-06-05 DIAGNOSIS — F17210 Nicotine dependence, cigarettes, uncomplicated: Secondary | ICD-10-CM | POA: Diagnosis not present

## 2015-06-05 DIAGNOSIS — Z992 Dependence on renal dialysis: Secondary | ICD-10-CM

## 2015-06-05 DIAGNOSIS — I739 Peripheral vascular disease, unspecified: Secondary | ICD-10-CM | POA: Insufficient documentation

## 2015-06-05 DIAGNOSIS — N186 End stage renal disease: Secondary | ICD-10-CM | POA: Insufficient documentation

## 2015-06-05 DIAGNOSIS — I5020 Unspecified systolic (congestive) heart failure: Secondary | ICD-10-CM | POA: Insufficient documentation

## 2015-06-05 DIAGNOSIS — R0600 Dyspnea, unspecified: Secondary | ICD-10-CM | POA: Diagnosis not present

## 2015-06-05 DIAGNOSIS — Z79899 Other long term (current) drug therapy: Secondary | ICD-10-CM | POA: Insufficient documentation

## 2015-06-05 DIAGNOSIS — J811 Chronic pulmonary edema: Secondary | ICD-10-CM | POA: Diagnosis not present

## 2015-06-05 DIAGNOSIS — Z9115 Patient's noncompliance with renal dialysis: Secondary | ICD-10-CM | POA: Diagnosis not present

## 2015-06-05 DIAGNOSIS — R0602 Shortness of breath: Secondary | ICD-10-CM | POA: Diagnosis present

## 2015-06-05 LAB — COMPREHENSIVE METABOLIC PANEL
ALT: 26 U/L (ref 17–63)
ANION GAP: 16 — AB (ref 5–15)
AST: 32 U/L (ref 15–41)
Albumin: 3.7 g/dL (ref 3.5–5.0)
Alkaline Phosphatase: 90 U/L (ref 38–126)
BILIRUBIN TOTAL: 0.9 mg/dL (ref 0.3–1.2)
BUN: 81 mg/dL — AB (ref 6–20)
CHLORIDE: 102 mmol/L (ref 101–111)
CO2: 16 mmol/L — ABNORMAL LOW (ref 22–32)
Calcium: 6.8 mg/dL — ABNORMAL LOW (ref 8.9–10.3)
Creatinine, Ser: 13.13 mg/dL — ABNORMAL HIGH (ref 0.61–1.24)
GFR, EST AFRICAN AMERICAN: 4 mL/min — AB (ref >60)
GFR, EST NON AFRICAN AMERICAN: 4 mL/min — AB (ref >60)
Glucose, Bld: 61 mg/dL — ABNORMAL LOW (ref 65–99)
POTASSIUM: 4.9 mmol/L (ref 3.5–5.1)
Sodium: 134 mmol/L — ABNORMAL LOW (ref 135–145)
TOTAL PROTEIN: 7.8 g/dL (ref 6.5–8.1)

## 2015-06-05 LAB — CBC
HCT: 34.9 % — ABNORMAL LOW (ref 40.0–52.0)
Hemoglobin: 11.7 g/dL — ABNORMAL LOW (ref 13.0–18.0)
MCH: 29.9 pg (ref 26.0–34.0)
MCHC: 33.4 g/dL (ref 32.0–36.0)
MCV: 89.5 fL (ref 80.0–100.0)
PLATELETS: 193 10*3/uL (ref 150–440)
RBC: 3.9 MIL/uL — AB (ref 4.40–5.90)
RDW: 15 % — AB (ref 11.5–14.5)
WBC: 4.8 10*3/uL (ref 3.8–10.6)

## 2015-06-05 LAB — TROPONIN I: TROPONIN I: 0.13 ng/mL — AB (ref <0.031)

## 2015-06-05 NOTE — ED Provider Notes (Signed)
[facility] Emergency Department Provider Note  ____________________________________________  Time seen: 8:35 AM  I have reviewed the triage vital signs and the nursing notes.   HISTORY  Chief Complaint Shortness of Breath    HPI [name] is a 60 y.o. male who complains of shortness of breath since about 3 AM today. Gradual onset. Worse lying supine. Better sitting upright. Not exertional. No chest pain. Normally gets dialysis Monday Wednesday Friday but missed his session yesterday due to attending a funeral. His next scheduled dialysis is 5 AM tomorrow morning. He called dialysis today to see if they could work him in, but they said they had no free slots and told him to come to the hospital for evaluation to see if he can get dialyzed here.     Past Medical History  Diagnosis Date  . Peripheral vascular disease ([place])   . ESRD (end stage renal disease) [facility])      Patient Active Problem List   Diagnosis Date Noted  . Pulmonary edema 12/02/2014  . CHF (congestive heart failure) ([place]) 11/10/2014     Past Surgical History  Procedure Laterality Date  . Av fistula placement Left 2014  . Peripheral vascular catheterization N/A 10/03/2014    Procedure: A/V Shuntogram/Fistulagram;  Surgeon: Algernon Huxley, MD;  Location: [place] [facility];  Service: Cardiovascular;  Laterality: N/A;  . Peripheral vascular catheterization Left 10/03/2014    Procedure: A/V Shunt Intervention;  Surgeon: Algernon Huxley, MD;  Location: [place] [facility];  Service: Cardiovascular;  Laterality: Left;  . Peripheral vascular catheterization Left 05/01/2015    Procedure: A/V Shuntogram/Fistulagram;  Surgeon: Algernon Huxley, MD;  Location: [place] [facility];  Service: Cardiovascular;  Laterality: Left;  . Peripheral vascular catheterization N/A 05/01/2015    Procedure: A/V Shunt Intervention;  Surgeon: Algernon Huxley, MD;  Location: [place] [facility];  Service: Cardiovascular;   Laterality: N/A;     Current Outpatient Rx  Name  Route  Sig  Dispense  Refill  . amLODipine (NORVASC) 10 MG tablet   Oral   Take 10 mg by mouth daily.         . calcium acetate, Phos Binder, (PHOSLYRA) 667 MG/5ML SOLN   Oral   Take 2,001 mg by mouth 3 (three) times daily with meals. Reported on 05/01/2015         . cinacalcet (SENSIPAR) 90 MG tablet   Oral   Take 90 mg by mouth daily.         . hydrALAZINE (APRESOLINE) 25 MG tablet   Oral   Take 2 tablets (50 mg total) by mouth 3 (three) times daily.   90 tablet   0   . loratadine (CLARITIN) 10 MG tablet   Oral   Take 10 mg by mouth daily.            Allergies Sulfa antibiotics   Family History  Problem Relation Age of Onset  . Hypertension Son     Social History Social History  Substance Use Topics  . Smoking status: Current Some Day Smoker -- 0.50 packs/day for 40 years    Types: Cigarettes    Last Attempt to Quit: 04/17/2014  . Smokeless tobacco: Never Used  . Alcohol Use: No    Review of Systems  Constitutional:   No fever or chills.  Eyes:   No vision changes.  ENT:   No sore throat. No rhinorrhea. Cardiovascular:   No chest pain. Respiratory:  Positive shortness of breath, no cough. Gastrointestinal:   Negative for abdominal pain, vomiting and diarrhea.  No bloody stool. Genitourinary:   Negative for dysuria or difficulty urinating. Musculoskeletal:   Negative for focal pain or swelling Neurological:   Negative for headaches 10-point ROS otherwise negative.  ____________________________________________   PHYSICAL EXAM:  VITAL SIGNS: ED Triage Vitals  Enc Vitals Group     BP 06/05/15 0818 167/112 mmHg     Pulse Rate 06/05/15 0811 80     Resp 06/05/15 0811 20     Temp 06/05/15 0811 97.7 F (36.5 C)     Temp Source 06/05/15 0811 Oral     SpO2 06/05/15 0811 96 %     Weight 06/05/15 0806 166 lb (75.297 kg)     Height 06/05/15 0806 5\' 8"  (1.727 m)     Head Cir --      Peak Flow  --      Pain Score 06/05/15 0807 0     Pain Loc --      Pain Edu? --      Excl. in [place]? --     Vital signs reviewed, nursing assessments reviewed.   Constitutional:   Alert and oriented. Well appearing and in no distress. Eyes:   No scleral icterus. No conjunctival pallor. PERRL. EOMI ENT   Head:   Normocephalic and atraumatic.   Nose:   No congestion/rhinnorhea. No septal hematoma   Mouth/Throat:   MMM, no pharyngeal erythema. No peritonsillar mass.    Neck:   No stridor. No SubQ emphysema. No meningismus. Hematological/Lymphatic/Immunilogical:   No cervical lymphadenopathy. Cardiovascular:   RRR. Symmetric bilateral radial and DP pulses.  No murmurs.  Respiratory:   Normal respiratory effort without tachypnea nor retractions. Bibasilar crackles. No wheezing. Gastrointestinal:   Soft and nontender. Non distended. There is no CVA tenderness.  No rebound, rigidity, or guarding. Genitourinary:   deferred Musculoskeletal:   Nontender with normal range of motion in all extremities. No joint effusions.  No lower extremity tenderness.  No edema. Neurologic:   Normal speech and language.  CN 2-10 normal. Motor grossly intact. No gross focal neurologic deficits are appreciated.  Skin:    Skin is warm, dry and intact. No rash noted.  No petechiae, purpura, or bullae.  ____________________________________________    LABS (pertinent positives/negatives) (all labs ordered are listed, but only abnormal results are displayed) Labs Reviewed  CBC - Abnormal; Notable for the following:    RBC 3.90 (*)    Hemoglobin 11.7 (*)    HCT 34.9 (*)    RDW 15.0 (*)    All other components within normal limits  TROPONIN I - Abnormal; Notable for the following:    Troponin I 0.13 (*)    All other components within normal limits  COMPREHENSIVE METABOLIC PANEL - Abnormal; Notable for the following:    Sodium 134 (*)    CO2 16 (*)    Glucose, Bld 61 (*)    BUN 81 (*)    Creatinine, Ser  13.13 (*)    Calcium 6.8 (*)    GFR calc non Af Amer 4 (*)    GFR calc Af Amer 4 (*)    Anion gap 16 (*)    All other components within normal limits   ____________________________________________   EKG  Interpreted by me Sinus rhythm rate of 81, normal axis. Prolonged QTC at 522 ms. Poor R-wave progression in anterior precordial leads. Left ventricular hypertrophy with associated repolarization abnormalities in the  anterior leads. Lateral T wave inversions. No significant changes compared to prior EKG on 12/02/2014  ____________________________________________    RADIOLOGY  Chest x-ray unremarkable  ____________________________________________   PROCEDURES   ____________________________________________   INITIAL IMPRESSION / ASSESSMENT AND PLAN / ED COURSE  Pertinent labs & imaging results that were available during my care of the patient were reviewed by me and considered in my medical decision making (see chart for details).  Patient presents with dyspnea in the setting of a missed dialysis session. Not in distress, vital signs unremarkable. EKG and chest x-ray unchanged. Not significantly volume overloaded. Not hypoxic. Electrolytes are unremarkable and consistent with end-stage renal disease. Troponin is slightly elevated at 0.13, increased from a baseline of 0.08. Patient is not having chest pain or any other exertional symptoms and have low suspicion that this is ACS. I think it is far more likely that the troponin change is due to increased strain on the heart from some mild congestion.  At 945 I discussed the results with the patient. He was on the phone with his dialysis Center who informed him that they could work him in at 11:45 AM today. He states that he will find himself transportation to the dialysis center. I informed him of the increased troponin measurement today and the concerns related to this and recommended waiting to recheck his troponin here. He refuses  and wishes to be discharged right away. There is no indication for emergent in-hospital dialysis today. I encouraged him to follow up with cardiology closely as well as primary care. Patient has medical decision-making capacity and has a reasonable plan to continue his management of his end-stage renal disease with outpatient dialysis, although I do recommend that he undergo serial troponin given his medical history risk factors and the increased measurement today. He refuses and will be discharged [facility].     ____________________________________________   FINAL CLINICAL IMPRESSION(S) / ED DIAGNOSES  Final diagnoses:  Dyspnea  End stage renal failure on dialysis [facility])       Portions of this note were generated with dragon dictation software. Dictation errors may occur despite best attempts at proofreading.   Carrie Mew, MD 06/05/15 [id number]

## 2015-06-05 NOTE — Discharge Instructions (Signed)
Dialysis Diet Dialysis is a treatment that you may undergo if you have significant damage to your kidneys. Dialysis replaces some of the work that the kidneys do. One of the jobs that it takes over is removing wastes, salt, and extra water from your blood. This helps to keep the amount of potassium and other nutrients in your blood at healthy levels. When you need dialysis, it is important to pay careful attention to your diet. Between dialysis sessions, certain nutrients can build up in your blood and cause you to get sick. Vitamins and minerals are an important part of a healthy diet and should not be avoided entirely. However, it is commonly recommended that you limit your intake of potassium, phosphorus, and sodium. It may also be necessary to restrict other nutrients, such as carbohydrates or fat, if you have other health conditions. Your health care provider or dietitian can help you to determine the amount of these nutrients that is right for you. WHAT IS MY PLAN? Your dietitian will help you to design a meal plan that is specific to your needs. Generally, meal plans include:  Grains, 6-11 servings per day. One serving is equal to 1 slice of bread or  cup of cooked rice or pasta.  Low-potassium vegetables, 2-3 servings per day. One serving is equal to  cup.  Low-potassium fruits, 2-3 servings per day. One serving is equal to  cup.  Meats and other protein sources, 8-11 oz per day.  Dairy,  cup per day. Your dietitian will provide you with specific instructions about the amount of fluids you can have each day. WHAT DO I NEED TO KNOW ABOUT A DIALYSIS DIET?  Limit your intake of potassium. Potassium is found in milk, fruits, and vegetables.  Limit your intake of phosphorus. Phosphorus is found in milk, cheese, beans, nuts, and carbonated beverages. Avoid whole-grain and high-fiber foods because they contain high amounts of phosphorus.  Limit your intake of sodium. Foods that are high in  sodium include processed and cured meats, ready-made frozen meals, canned vegetables, and salty snack foods. Do not use salt substitutes because they contain potassium.  If you were instructed to restrict your fluid intake, follow your health care provider's specific instructions. You may be told to:  Write down what you drink and any foods you eat that are made mostly from water, such as gelatin and soups.  Drink from small cups to help control how much you drink.  Ask your health care provider if you should regularly take an over-the-counter medicine that binds phosphorus, such as antacid products that contain calcium carbonate.  Take vitamin and mineral supplements only as directed by your health care provider.  Eat high-quality proteins, such as meat, poultry, fish, and eggs. Limit low-quality plant-based proteins, such as nuts and beans.  Cut potatoes into small pieces and boil them in unsalted water before you eat them. This can help to remove some potassium from the potato.  Drain all fluid from cooked vegetables and canned fruits before eating them. WHAT FOODS CAN I EAT? Grains White bread. White rice. Cooked cereal. Unsalted popcorn. Tortillas. Pasta. Vegetables Fresh or frozen broccoli, carrots, and green beans. Cabbage. Cauliflower. Celery. Cucumbers. Eggplant. Radishes. Zucchini. Fruits Apples. Fresh or frozen berries. Fresh or canned pears, peaches, and pineapple. Grapes. Plums. Meats and Other Protein Sources Fresh or frozen beef, pork, chicken, and fish. Eggs. Low-sodium canned tuna or salmon. Dairy Cream cheese. Heavy cream. Ricotta cheese. Beverages Apple cider. Cranberry juice. Grape juice. Lemonade.  Black coffee. Condiments Herbs. Spices. Jam and jelly. Honey. Sweets and Desserts Sherbet. Cakes. Cookies. Fats and Oils Olive oil, canola oil, and safflower oil. Other Non-dairy creamer. Non-dairy whipped topping. Homemade broth without salt. The items listed  above may not be a complete list of recommended foods or beverages. Contact your dietitian for more options.  WHAT FOODS ARE NOT RECOMMENDED? Grains Whole-grain bread. Whole-grain pasta. High-fiber cereal. Vegetables Potatoes. Beets. Tomatoes. Winter squash and pumpkin. Asparagus. Spinach. Parsnips. Fruits Star fruit. Bananas. Oranges. Kiwi. Nectarines. Prunes. Melon. Dried fruit. Avocado. Meats and Other Protein Sources Canned, smoked, and cured meats. Soil scientist. Sardines. Nuts and seeds. Peanut butter. Beans and legumes. Dairy Milk. Buttermilk. Yogurt. Cheese and cottage cheese. Processed cheese spreads. Beverages Orange juice. Prune juice. Carbonated soft drinks. Condiments Salt. Salt substitutes. Soy sauce. Sweets and Desserts Ice cream. Chocolate. Candied nuts. Fats and Oils Butter. Margarine. Other Ready-made frozen meals. Canned soups. The items listed above may not be a complete list of foods and beverages to avoid. Contact your dietitian for more information.   This information is not intended to replace advice given to you by your health care provider. Make sure you discuss any questions you have with your health care provider.   Document Released: 11/06/2003 Document Revised: 03/01/2014 Document Reviewed: 09/11/2013 Elsevier Interactive Patient Education 2016 [place] Kidney Disease The kidneys are two organs that lie on either side of the spine between the middle of the back and the front of the abdomen. The kidneys:   Remove wastes and extra water from the blood.   Produce important hormones. These help keep bones strong, regulate blood pressure, and help create red blood cells.   Balance the fluids and chemicals in the blood and tissues. End-stage kidney disease occurs when the kidneys are so damaged that they cannot do their job. When the kidneys cannot do their job, life-threatening problems occur. The body cannot stay clean and  strong without the help of the kidneys. In end-stage kidney disease, the kidneys cannot get better.You need a new kidney or treatments to do some of the work healthy kidneys do in order to stay alive. CAUSES  End-stage kidney disease usually occurs when a long-lasting (chronic) kidney disease gets worse. It may also occur after the kidneys are suddenly damaged (acute kidney injury).  SYMPTOMS   Swelling (edema) of the legs, ankles, or feet.   Tiredness (lethargy).   Nausea or vomiting.   Confusion.   Problems with urination, such as:   Decreased urine production.   Frequent urination, especially at night.   Frequent accidents in children who are potty trained.   Muscle twitches and cramps.   Persistent itchiness.   Loss of appetite.   Headaches.   Abnormally dark or light skin.   Numbness in the hands or feet.   Easy bruising.   Frequent hiccups.   Menstruation stops. DIAGNOSIS  Your health care provider will measure your blood pressure and take some tests. These may include:   Urine tests.   Blood tests.   Imaging tests, such as:   An ultrasound exam.   Computed tomography (CT).  A kidney biopsy. TREATMENT  There are two treatments for end-stage kidney disease:   A procedure that removes toxic wastes from the body (dialysis).   Receiving a new kidney (kidney transplant). Both of these treatments have serious risks and consequences. Your health care provider will help you determine which treatment is best for you based on your health,  age, and other factors. In addition to having dialysis or a kidney transplant, you may need to take medicines to control high blood pressure (hypertension) and cholesterol and to decrease phosphorus levels in your blood.  HOME CARE INSTRUCTIONS  Follow your prescribed diet.   Take medicines only as directed by your health care provider.   Do not take any new medicines (prescription,  over-the-counter, or nutritional supplements) unless approved by your health care provider. Many medicines can worsen your kidney damage or need to have the dose adjusted.   Keep all follow-up visits as directed by your health care provider. MAKE SURE YOU:  Understand these instructions.  Will watch your condition.  Will get help right away if you are not doing well or get worse.   This information is not intended to replace advice given to you by your health care provider. Make sure you discuss any questions you have with your health care provider.   Document Released: 05/01/2003 Document Revised: 03/01/2014 Document Reviewed: 10/08/2011 Elsevier Interactive Patient Education 2016 [place] of Breath Shortness of breath means you have trouble breathing. It could also mean that you have a medical problem. You should get immediate medical care for shortness of breath. CAUSES   Not enough oxygen in the air such as with high altitudes or a smoke-filled room.  Certain lung diseases, infections, or problems.  Heart disease or conditions, such as angina or heart failure.  Low red blood cells (anemia).  Poor physical fitness, which can cause shortness of breath when you exercise.  Chest or back injuries or stiffness.  Being overweight.  Smoking.  Anxiety, which can make you feel like you are not getting enough air. DIAGNOSIS  Serious medical problems can often be found during your physical exam. Tests may also be done to determine why you are having shortness of breath. Tests may include:  Chest X-rays.  Lung function tests.  Blood tests.  An electrocardiogram (ECG).  An ambulatory electrocardiogram. An ambulatory ECG records your heartbeat patterns over a 24-hour period.  Exercise testing.  A transthoracic echocardiogram (TTE). During echocardiography, sound waves are used to evaluate how blood flows through your heart.  A transesophageal  echocardiogram (TEE).  Imaging scans. Your health care provider may not be able to find a cause for your shortness of breath after your exam. In this case, it is important to have a follow-up exam with your health care provider as directed.  TREATMENT  Treatment for shortness of breath depends on the cause of your symptoms and can vary greatly. HOME CARE INSTRUCTIONS   Do not smoke. Smoking is a common cause of shortness of breath. If you smoke, ask for help to quit.  Avoid being around chemicals or things that may bother your breathing, such as paint fumes and dust.  Rest as needed. Slowly resume your usual activities.  If medicines were prescribed, take them as directed for the full length of time directed. This includes oxygen and any inhaled medicines.  Keep all follow-up appointments as directed by your health care provider. SEEK MEDICAL CARE IF:   Your condition does not improve in the time expected.  You have a hard time doing your normal activities even with rest.  You have any new symptoms. SEEK IMMEDIATE MEDICAL CARE IF:   Your shortness of breath gets worse.  You feel light-headed, faint, or develop a cough not controlled with medicines.  You start coughing up blood.  You have pain with breathing.  You have chest pain or pain in your arms, shoulders, or abdomen.  You have a fever.  You are unable to walk up stairs or exercise the way you normally do. MAKE SURE YOU:  Understand these instructions.  Will watch your condition.  Will get help right away if you are not doing well or get worse.   This information is not intended to replace advice given to you by your health care provider. Make sure you discuss any questions you have with your health care provider.   Document Released: 11/03/2000 Document Revised: 02/13/2013 Document Reviewed: 04/26/2011 Elsevier Interactive Patient Education [employer].

## 2015-06-05 NOTE — ED Notes (Signed)
Short of breath x5 hours , dialysis M,W,F, last dialysis was Monday, pt denies cheat pain , no respiratory distress noted on arrival , ambulatory

## 2015-08-02 ENCOUNTER — Inpatient Hospital Stay
Admission: EM | Admit: 2015-08-02 | Discharge: 2015-08-04 | DRG: 291 | Disposition: A | Discharge status: Home / Self Care | Payer: Medicare Other | Attending: Internal Medicine | Admitting: Internal Medicine

## 2015-08-02 ENCOUNTER — Encounter: Payer: Self-pay | Admitting: Family Medicine

## 2015-08-02 ENCOUNTER — Emergency Department: Payer: Medicare Other

## 2015-08-02 DIAGNOSIS — Z79899 Other long term (current) drug therapy: Secondary | ICD-10-CM

## 2015-08-02 DIAGNOSIS — F14929 Cocaine use, unspecified with intoxication, unspecified: Secondary | ICD-10-CM | POA: Diagnosis present

## 2015-08-02 DIAGNOSIS — J810 Acute pulmonary edema: Secondary | ICD-10-CM

## 2015-08-02 DIAGNOSIS — I132 Hypertensive heart and chronic kidney disease with heart failure and with stage 5 chronic kidney disease, or end stage renal disease: Principal | ICD-10-CM | POA: Diagnosis present

## 2015-08-02 DIAGNOSIS — J9690 Respiratory failure, unspecified, unspecified whether with hypoxia or hypercapnia: Secondary | ICD-10-CM

## 2015-08-02 DIAGNOSIS — I10 Essential (primary) hypertension: Secondary | ICD-10-CM

## 2015-08-02 DIAGNOSIS — E875 Hyperkalemia: Secondary | ICD-10-CM | POA: Diagnosis not present

## 2015-08-02 DIAGNOSIS — I160 Hypertensive urgency: Secondary | ICD-10-CM | POA: Diagnosis present

## 2015-08-02 DIAGNOSIS — Z992 Dependence on renal dialysis: Secondary | ICD-10-CM

## 2015-08-02 DIAGNOSIS — Z8249 Family history of ischemic heart disease and other diseases of the circulatory system: Secondary | ICD-10-CM

## 2015-08-02 DIAGNOSIS — I5043 Acute on chronic combined systolic (congestive) and diastolic (congestive) heart failure: Secondary | ICD-10-CM | POA: Diagnosis present

## 2015-08-02 DIAGNOSIS — I739 Peripheral vascular disease, unspecified: Secondary | ICD-10-CM | POA: Diagnosis present

## 2015-08-02 DIAGNOSIS — J9601 Acute respiratory failure with hypoxia: Secondary | ICD-10-CM | POA: Diagnosis not present

## 2015-08-02 DIAGNOSIS — E8770 Fluid overload, unspecified: Secondary | ICD-10-CM

## 2015-08-02 DIAGNOSIS — F17210 Nicotine dependence, cigarettes, uncomplicated: Secondary | ICD-10-CM | POA: Diagnosis present

## 2015-08-02 DIAGNOSIS — J9600 Acute respiratory failure, unspecified whether with hypoxia or hypercapnia: Secondary | ICD-10-CM | POA: Diagnosis not present

## 2015-08-02 DIAGNOSIS — N2581 Secondary hyperparathyroidism of renal origin: Secondary | ICD-10-CM | POA: Diagnosis present

## 2015-08-02 DIAGNOSIS — D631 Anemia in chronic kidney disease: Secondary | ICD-10-CM | POA: Diagnosis present

## 2015-08-02 DIAGNOSIS — N186 End stage renal disease: Secondary | ICD-10-CM

## 2015-08-02 LAB — COMPREHENSIVE METABOLIC PANEL
ALBUMIN: 3.6 g/dL (ref 3.5–5.0)
ALK PHOS: 104 U/L (ref 38–126)
ALT: 27 U/L (ref 17–63)
ANION GAP: 11 (ref 5–15)
AST: 25 U/L (ref 15–41)
BUN: 47 mg/dL — ABNORMAL HIGH (ref 6–20)
CALCIUM: 7.2 mg/dL — AB (ref 8.9–10.3)
CHLORIDE: 107 mmol/L (ref 101–111)
CO2: 21 mmol/L — AB (ref 22–32)
Creatinine, Ser: 8.69 mg/dL — ABNORMAL HIGH (ref 0.61–1.24)
GFR calc Af Amer: 7 mL/min — ABNORMAL LOW (ref >60)
GFR calc non Af Amer: 6 mL/min — ABNORMAL LOW (ref >60)
GLUCOSE: 83 mg/dL (ref 65–99)
Potassium: 5.1 mmol/L (ref 3.5–5.1)
SODIUM: 139 mmol/L (ref 135–145)
Total Bilirubin: 0.3 mg/dL (ref 0.3–1.2)
Total Protein: 7.2 g/dL (ref 6.5–8.1)

## 2015-08-02 LAB — CBC
HCT: 35.1 % — ABNORMAL LOW (ref 40.0–52.0)
HEMOGLOBIN: 11.4 g/dL — AB (ref 13.0–18.0)
MCH: 29.4 pg (ref 26.0–34.0)
MCHC: 32.5 g/dL (ref 32.0–36.0)
MCV: 90.5 fL (ref 80.0–100.0)
PLATELETS: 190 10*3/uL (ref 150–440)
RBC: 3.88 MIL/uL — AB (ref 4.40–5.90)
RDW: 16.3 % — ABNORMAL HIGH (ref 11.5–14.5)
WBC: 6.5 10*3/uL (ref 3.8–10.6)

## 2015-08-02 LAB — TROPONIN I: Troponin I: 0.08 ng/mL — ABNORMAL HIGH (ref <0.031)

## 2015-08-02 LAB — GLUCOSE, CAPILLARY: Glucose-Capillary: 103 mg/dL — ABNORMAL HIGH (ref 65–99)

## 2015-08-02 MED ORDER — FUROSEMIDE 10 MG/ML IJ SOLN
80.0000 mg | Freq: Once | INTRAMUSCULAR | Status: AC
  Administered 2015-08-02: 80 mg via INTRAVENOUS
  Filled 2015-08-02: qty 8

## 2015-08-02 MED ORDER — HYDRALAZINE HCL 25 MG PO TABS
25.0000 mg | ORAL_TABLET | Freq: Three times a day (TID) | ORAL | Status: DC
  Administered 2015-08-02 – 2015-08-04 (×6): 25 mg via ORAL
  Filled 2015-08-02 (×6): qty 1

## 2015-08-02 MED ORDER — CALCIUM ACETATE (PHOS BINDER) 667 MG/5ML PO SOLN
2001.0000 mg | Freq: Three times a day (TID) | ORAL | Status: DC
  Administered 2015-08-03: 2001 mg via ORAL
  Filled 2015-08-02 (×7): qty 15

## 2015-08-02 MED ORDER — SODIUM CHLORIDE 0.9% FLUSH
3.0000 mL | Freq: Two times a day (BID) | INTRAVENOUS | Status: DC
  Administered 2015-08-02 – 2015-08-04 (×5): 3 mL via INTRAVENOUS

## 2015-08-02 MED ORDER — METOPROLOL TARTRATE 5 MG/5ML IV SOLN
INTRAVENOUS | Status: AC
  Administered 2015-08-02: 5 mg via INTRAVENOUS
  Filled 2015-08-02: qty 5

## 2015-08-02 MED ORDER — NITROGLYCERIN IN D5W 200-5 MCG/ML-% IV SOLN
0.0000 ug/min | INTRAVENOUS | Status: DC
  Administered 2015-08-02: 15 ug/min via INTRAVENOUS

## 2015-08-02 MED ORDER — IPRATROPIUM-ALBUTEROL 0.5-2.5 (3) MG/3ML IN SOLN
3.0000 mL | RESPIRATORY_TRACT | Status: DC | PRN
  Administered 2015-08-03: 3 mL via RESPIRATORY_TRACT
  Filled 2015-08-02: qty 3

## 2015-08-02 MED ORDER — HEPARIN SODIUM (PORCINE) 5000 UNIT/ML IJ SOLN
5000.0000 [IU] | Freq: Three times a day (TID) | INTRAMUSCULAR | Status: DC
  Administered 2015-08-02 – 2015-08-04 (×5): 5000 [IU] via SUBCUTANEOUS
  Filled 2015-08-02 (×5): qty 1

## 2015-08-02 MED ORDER — CINACALCET HCL 30 MG PO TABS
90.0000 mg | ORAL_TABLET | Freq: Every day | ORAL | Status: DC
  Administered 2015-08-03 – 2015-08-04 (×2): 90 mg via ORAL
  Filled 2015-08-02 (×2): qty 3

## 2015-08-02 MED ORDER — NITROGLYCERIN IN D5W 200-5 MCG/ML-% IV SOLN
INTRAVENOUS | Status: AC
  Administered 2015-08-02: 15 ug/min via INTRAVENOUS
  Filled 2015-08-02: qty 250

## 2015-08-02 MED ORDER — ACETAMINOPHEN 650 MG RE SUPP: 650.0000 mg | Freq: Four times a day (QID) | RECTAL | Status: DC | PRN

## 2015-08-02 MED ORDER — ONDANSETRON HCL 4 MG PO TABS: 4.0000 mg | ORAL_TABLET | Freq: Four times a day (QID) | ORAL | Status: DC | PRN

## 2015-08-02 MED ORDER — METOPROLOL TARTRATE 5 MG/5ML IV SOLN
10.0000 mg | Freq: Four times a day (QID) | INTRAVENOUS | Status: DC | PRN
  Administered 2015-08-02: 5 mg via INTRAVENOUS

## 2015-08-02 MED ORDER — ENALAPRIL MALEATE 5 MG PO TABS: 10.0000 mg | ORAL_TABLET | Freq: Every day | ORAL | Status: DC

## 2015-08-02 MED ORDER — ONDANSETRON HCL 4 MG/2ML IJ SOLN
4.0000 mg | Freq: Four times a day (QID) | INTRAMUSCULAR | Status: DC | PRN
Start: 2015-08-02 — End: 2015-08-04

## 2015-08-02 MED ORDER — FUROSEMIDE 10 MG/ML IJ SOLN
80.0000 mg | Freq: Two times a day (BID) | INTRAMUSCULAR | Status: AC
  Administered 2015-08-03: 80 mg via INTRAVENOUS
  Filled 2015-08-02 (×2): qty 8

## 2015-08-02 MED ORDER — BISACODYL 5 MG PO TBEC: 5.0000 mg | DELAYED_RELEASE_TABLET | Freq: Every day | ORAL | Status: DC | PRN

## 2015-08-02 MED ORDER — AMLODIPINE BESYLATE 10 MG PO TABS
10.0000 mg | ORAL_TABLET | Freq: Every day | ORAL | Status: DC
  Administered 2015-08-03 – 2015-08-04 (×2): 10 mg via ORAL
  Filled 2015-08-02 (×2): qty 1

## 2015-08-02 MED ORDER — ACETAMINOPHEN 325 MG PO TABS
650.0000 mg | ORAL_TABLET | Freq: Four times a day (QID) | ORAL | Status: DC | PRN
  Administered 2015-08-03: 650 mg via ORAL
  Filled 2015-08-02: qty 2

## 2015-08-02 MED ORDER — NICOTINE 14 MG/24HR TD PT24
14.0000 mg | MEDICATED_PATCH | Freq: Every day | TRANSDERMAL | Status: DC
  Administered 2015-08-03 – 2015-08-04 (×2): 14 mg via TRANSDERMAL
  Filled 2015-08-02 (×2): qty 1

## 2015-08-02 NOTE — ED Notes (Signed)
Pt updated on treatment plan and admission status. Pt verbalizes understanding.

## 2015-08-02 NOTE — ED Provider Notes (Signed)
[facility] Emergency Department Provider Note  ____________________________________________    I have reviewed the triage vital signs and the nursing notes.   HISTORY  Chief Complaint Shortness of Breath    HPI [name] is a 60 y.o. male who presents with shortness of breath. Patient has end-stage renal disease, receives dialysis Monday Wednesday Friday. He had a full course of dialysis yesterday. He woke up this morning feeling short of breath. He is worried that he has fluid on his lungs. Denies fevers or chills. He reports similar symptoms in the past. No chest pain, no recent travel.     Past Medical History  Diagnosis Date  . Peripheral vascular disease ([place])   . ESRD (end stage renal disease) [facility])     Patient Active Problem List   Diagnosis Date Noted  . Pulmonary edema 12/02/2014  . CHF (congestive heart failure) ([place]) 11/10/2014    Past Surgical History  Procedure Laterality Date  . Av fistula placement Left 2014  . Peripheral vascular catheterization N/A 10/03/2014    Procedure: A/V Shuntogram/Fistulagram;  Surgeon: Algernon Huxley, MD;  Location: [place] [facility];  Service: Cardiovascular;  Laterality: N/A;  . Peripheral vascular catheterization Left 10/03/2014    Procedure: A/V Shunt Intervention;  Surgeon: Algernon Huxley, MD;  Location: [place] [facility];  Service: Cardiovascular;  Laterality: Left;  . Peripheral vascular catheterization Left 05/01/2015    Procedure: A/V Shuntogram/Fistulagram;  Surgeon: Algernon Huxley, MD;  Location: [place] [facility];  Service: Cardiovascular;  Laterality: Left;  . Peripheral vascular catheterization N/A 05/01/2015    Procedure: A/V Shunt Intervention;  Surgeon: Algernon Huxley, MD;  Location: [place] [facility];  Service: Cardiovascular;  Laterality: N/A;    Current Outpatient Rx  Name  Route  Sig  Dispense  Refill  . amLODipine (NORVASC) 10 MG tablet   Oral   Take 10 mg by mouth daily.         . calcium acetate, Phos Binder, (PHOSLYRA) 667 MG/5ML SOLN   Oral   Take 2,001 mg by mouth 3 (three) times daily with meals. Reported on 05/01/2015         . cinacalcet (SENSIPAR) 90 MG tablet   Oral   Take 90 mg by mouth daily.         . hydrALAZINE (APRESOLINE) 25 MG tablet   Oral   Take 2 tablets (50 mg total) by mouth 3 (three) times daily.   90 tablet   0   . loratadine (CLARITIN) 10 MG tablet   Oral   Take 10 mg by mouth daily.           Allergies Sulfa antibiotics  Family History  Problem Relation Age of Onset  . Hypertension Son     Social History Social History  Substance Use Topics  . Smoking status: Current Some Day Smoker -- 0.50 packs/day for 40 years    Types: Cigarettes    Last Attempt to Quit: 04/17/2014  . Smokeless tobacco: Never Used  . Alcohol Use: No    Review of Systems  Constitutional: Negative for fever. Eyes: Negative for redness ENT: Negative for sore throat Cardiovascular: Negative for chest pain Respiratory: As above. Gastrointestinal: Negative for abdominal pain Genitourinary: Negative for dysuria. Musculoskeletal: Negative for back pain. Skin: Negative for rash. Neurological: Negative for focal weakness Psychiatric: no anxiety    ____________________________________________   PHYSICAL EXAM:  VITAL SIGNS: ED Triage Vitals  Enc Vitals Group  BP 08/02/15 2033 164/106 mmHg     Pulse Rate 08/02/15 2033 84     Resp 08/02/15 2033 24     Temp 08/02/15 2033 97.6 F (36.4 C)     Temp Source 08/02/15 2033 Oral     SpO2 08/02/15 2033 95 %     Weight 08/02/15 2033 180 lb (81.647 kg)     Height 08/02/15 2033 5\' 8"  (1.727 m)     Head Cir --      Peak Flow --      Pain Score --      Pain Loc --      Pain Edu? --      Excl. in [place]? --      Constitutional: Alert and oriented. Well appearing and in no distress.  Eyes: Conjunctivae are normal. No erythema or injection ENT   Head: Normocephalic and atraumatic.    Mouth/Throat: Mucous membranes are moist. Cardiovascular: Normal rate, regular rhythm. Normal and symmetric distal pulses are present in the upper extremities.  Respiratory: Normal respiratory effort without tachypnea nor retractions. Bibasilar rales Gastrointestinal: Soft and non-tender in all quadrants. No distention. There is no CVA tenderness. Genitourinary: deferred Musculoskeletal: Nontender with normal range of motion in all extremities. No lower extremity tenderness nor edema. Neurologic:  Normal speech and language. No gross focal neurologic deficits are appreciated. Skin:  Skin is warm, dry and intact. No rash noted. Psychiatric: Mood and affect are normal. Patient exhibits appropriate insight and judgment.  ____________________________________________    LABS (pertinent positives/negatives)  Labs Reviewed  CBC  COMPREHENSIVE METABOLIC PANEL  TROPONIN I    ____________________________________________   EKG  ED ECG REPORT I, Lavonia Drafts, the attending physician, personally viewed and interpreted this ECG.  Date: 08/02/2015 EKG Time: 8:41 PM Rate: 91 Rhythm: normal sinus rhythm QRS Axis: normal Intervals: normal ST/T Wave abnormalities: T-wave inversions V5 and V6 Conduction Disturbances: none    ____________________________________________    RADIOLOGY  Chest x-ray concerning for pulmonary edema  ____________________________________________   PROCEDURES  Procedure(s) performed: none  Critical Care performed:none  ____________________________________________   INITIAL IMPRESSION / ASSESSMENT AND PLAN / ED COURSE  Pertinent labs & imaging results that were available during my care of the patient were reviewed by me and considered in my medical decision making (see chart for details).  Patient with end-stage renal disease, lung exam is consistent with fluid overload. Pending chest x-ray and lab work. Suspect he will need admission for  dialysis. ----------------------------------------- 9:43 PM on 08/02/2015 -----------------------------------------   Chest x-ray concerning for pulmonary edema, we'll contact nephrology and the patient to the hospitalist service.  Spoke with Dr. Candiss Norse of Nephrology, he will evaluate the patient in the morning. ____________________________________________   FINAL CLINICAL IMPRESSION(S) / ED DIAGNOSES  Final diagnoses:  Acute pulmonary edema ([place])          Lavonia Drafts, MD 08/02/15 2147

## 2015-08-02 NOTE — ED Notes (Signed)
Patient reports being short of breath all day.  Patient is dialysis patient (M,W,F).  Reports had all of last treatment.  Patient with non productive cough.

## 2015-08-02 NOTE — H&P (Addendum)
PCP:   Pcp Not In System   Chief Complaint:  Shortness of breath  HPI: This is a 60 year old male with end-stage renal disease who dialyzes Monday Wednesdays and Fridays. He had his dialysis on Friday but awoke today quite short of breath. This has persisted prompting him to come to the ER. In the ER his chest x-ray shows fluid overload. The patient denies any chest pains. He does urinate and has been urinating normally. He denies any fever, chills, nausea or vomiting. He does have a cough productive cough for clear phlegm.  Review of Systems:  The patient denies anorexia, fever, weight loss,, vision loss, decreased hearing, hoarseness, chest pain, syncope, dyspnea on exertion, peripheral edema, balance deficits, hemoptysis, abdominal pain, melena, hematochezia, severe indigestion/heartburn, hematuria, incontinence, genital sores, muscle weakness, suspicious skin lesions, transient blindness, difficulty walking, depression, unusual weight change, abnormal bleeding, enlarged lymph nodes, angioedema, and breast masses.  Past Medical History: Past Medical History  Diagnosis Date  . Peripheral vascular disease ([place])   . ESRD (end stage renal disease) [facility])    Past Surgical History  Procedure Laterality Date  . Av fistula placement Left 2014  . Peripheral vascular catheterization N/A 10/03/2014    Procedure: A/V Shuntogram/Fistulagram;  Surgeon: Algernon Huxley, MD;  Location: [place] [facility];  Service: Cardiovascular;  Laterality: N/A;  . Peripheral vascular catheterization Left 10/03/2014    Procedure: A/V Shunt Intervention;  Surgeon: Algernon Huxley, MD;  Location: [place] [facility];  Service: Cardiovascular;  Laterality: Left;  . Peripheral vascular catheterization Left 05/01/2015    Procedure: A/V Shuntogram/Fistulagram;  Surgeon: Algernon Huxley, MD;  Location: [place] [facility];  Service: Cardiovascular;  Laterality: Left;  . Peripheral vascular catheterization N/A 05/01/2015    Procedure:  A/V Shunt Intervention;  Surgeon: Algernon Huxley, MD;  Location: [place] [facility];  Service: Cardiovascular;  Laterality: N/A;    Medications: Prior to Admission medications   Medication Sig Start Date End Date Taking? Authorizing Provider  docusate sodium (COLACE) 100 MG capsule Take 100 mg by mouth 2 (two) times daily.   Yes Historical Provider, MD  enalapril (VASOTEC) 10 MG tablet Take 10 mg by mouth daily.   Yes Historical Provider, MD  hydrALAZINE (APRESOLINE) 25 MG tablet Take 25 mg by mouth 3 (three) times daily.   Yes Historical Provider, MD  sevelamer carbonate (RENVELA) 2.4 g PACK Take 2.4 g by mouth 3 (three) times daily with meals.   Yes Historical Provider, MD  amLODipine (NORVASC) 10 MG tablet Take 10 mg by mouth daily.    Historical Provider, MD  calcium acetate, Phos Binder, (PHOSLYRA) 667 MG/5ML SOLN Take 2,001 mg by mouth 3 (three) times daily with meals. Reported on 05/01/2015    Historical Provider, MD  cinacalcet (SENSIPAR) 90 MG tablet Take 90 mg by mouth daily.    Historical Provider, MD  loratadine (CLARITIN) 10 MG tablet Take 10 mg by mouth daily.    Historical Provider, MD    Allergies:   Allergies  Allergen Reactions  . Sulfa Antibiotics Rash    Social History:  reports that he has been smoking Cigarettes.  He has a 20 pack-year smoking history. He has never used smokeless tobacco. He reports that he does not drink alcohol or use illicit drugs.  Family History: Family History  Problem Relation Age of Onset  . Hypertension Son     Physical Exam: Filed Vitals:   08/02/15 2100 08/02/15 2114 08/02/15 2115 08/02/15 2130  BP:  154/94  172/104  Pulse: 92 93 90 92  Temp:      TempSrc:      Resp: 31 26 22 26   Height:      Weight:      SpO2: 95% 93% 95% 96%    General:  Alert and oriented times three, well developed and nourished, SOB patient Eyes: PERRLA, pink conjunctiva, no scleral icterus ENT: Moist oral mucosa, neck supple, no thyromegaly Lungs:  clear to ascultation, no wheeze, mild crackles, no use of accessory muscles Cardiovascular: regular rate and rhythm, no regurgitation, no gallops, no murmurs. No carotid bruits, no JVD Abdomen: soft, positive BS, non-tender, non-distended, no organomegaly, not an acute abdomen GU: not examined Neuro: CN II - XII grossly intact, sensation intact Musculoskeletal: strength 5/5 all extremities, no clubbing, cyanosis or trace edema Skin: no rash, no subcutaneous crepitation, no decubitus Psych: appropriate patient   Labs on Admission:   Recent Labs  08/02/15 2050  NA 139  K 5.1  CL 107  CO2 21*  GLUCOSE 83  BUN 47*  CREATININE 8.69*  CALCIUM 7.2*    Recent Labs  08/02/15 2050  AST 25  ALT 27  ALKPHOS 104  BILITOT 0.3  PROT 7.2  ALBUMIN 3.6   No results for input(s): LIPASE, AMYLASE in the last 72 hours.  Recent Labs  08/02/15 2050  WBC 6.5  HGB 11.4*  HCT 35.1*  MCV 90.5  PLT 190    Recent Labs  08/02/15 2050  TROPONINI 0.08*   Invalid input(s): POCBNP No results for input(s): DDIMER in the last 72 hours. No results for input(s): HGBA1C in the last 72 hours. No results for input(s): CHOL, HDL, LDLCALC, TRIG, CHOLHDL, LDLDIRECT in the last 72 hours. No results for input(s): TSH, T4TOTAL, T3FREE, THYROIDAB in the last 72 hours.  Invalid input(s): FREET3 No results for input(s): VITAMINB12, FOLATE, FERRITIN, TIBC, IRON, RETICCTPCT in the last 72 hours.  Micro Results: No results found for this or any previous visit (from the past 240 hour(s)).   Radiological Exams on Admission: Dg Chest 2 View  08/02/2015  CLINICAL DATA:  Acute onset of shortness of breath and cough. Initial encounter. EXAM: CHEST  2 VIEW COMPARISON:  Chest radiograph performed 06/05/2015 FINDINGS: The lungs are well-aerated. Vascular congestion is noted. Peribronchial thickening is seen. Mildly increased interstitial markings could reflect minimal interstitial edema. There is no evidence of  pleural effusion or pneumothorax. The heart is enlarged.  No acute osseous abnormalities are seen. IMPRESSION: Vascular congestion and cardiomegaly. Peribronchial thickening seen. Mildly increased interstitial markings could reflect minimal interstitial edema. Electronically Signed   By: Garald Balding M.D.   On: 08/02/2015 21:23    Assessment/Plan Present on Admission:  Fluid overload -Admit to stepdown -Nephrology Dr. Candiss Norse aware and plans for urgent hemodialysis in a.m. -IV Lasix 80 mg every 12 hours for 2 doses -Oxygen, duonebs to keep sats greater 80%  End-stage renal disease -Urgent hemodialysis that for the a.m.  Hypertension -Home medications resumed, when necessary blood pressure medications ordered  Tobacco abuse -Eckstein patch, patient counseled on tobacco cessation for 3 minutes  Peripheral vascular disease -Stable, resume home medications   Erskin Zinda 08/02/2015, 9:59 PM

## 2015-08-02 NOTE — ED Notes (Signed)
Pt assisted up to restroom for bowel movement.  

## 2015-08-02 NOTE — ED Notes (Addendum)
Pt updated on treatment process, elevated troponin of 0.08 called from lab. Dr. Corky Downs notified.

## 2015-08-03 DIAGNOSIS — J9600 Acute respiratory failure, unspecified whether with hypoxia or hypercapnia: Secondary | ICD-10-CM

## 2015-08-03 DIAGNOSIS — E8770 Fluid overload, unspecified: Secondary | ICD-10-CM

## 2015-08-03 LAB — BASIC METABOLIC PANEL
Anion gap: 11 (ref 5–15)
BUN: 52 mg/dL — ABNORMAL HIGH (ref 6–20)
CALCIUM: 6.7 mg/dL — AB (ref 8.9–10.3)
CO2: 20 mmol/L — AB (ref 22–32)
CREATININE: 9.45 mg/dL — AB (ref 0.61–1.24)
Chloride: 109 mmol/L (ref 101–111)
GFR calc non Af Amer: 5 mL/min — ABNORMAL LOW (ref >60)
GFR, EST AFRICAN AMERICAN: 6 mL/min — AB (ref >60)
GLUCOSE: 79 mg/dL (ref 65–99)
Potassium: 5.9 mmol/L — ABNORMAL HIGH (ref 3.5–5.1)
Sodium: 140 mmol/L (ref 135–145)

## 2015-08-03 LAB — URINE DRUG SCREEN, QUALITATIVE (ARMC ONLY)
AMPHETAMINES, UR SCREEN: NOT DETECTED
BENZODIAZEPINE, UR SCRN: NOT DETECTED
Barbiturates, Ur Screen: NOT DETECTED
COCAINE METABOLITE, UR ~~LOC~~: POSITIVE — AB
Cannabinoid 50 Ng, Ur ~~LOC~~: NOT DETECTED
MDMA (Ecstasy)Ur Screen: NOT DETECTED
METHADONE SCREEN, URINE: NOT DETECTED
OPIATE, UR SCREEN: NOT DETECTED
PHENCYCLIDINE (PCP) UR S: NOT DETECTED
Tricyclic, Ur Screen: NOT DETECTED

## 2015-08-03 LAB — CBC
HCT: 30.6 % — ABNORMAL LOW (ref 40.0–52.0)
Hemoglobin: 9.8 g/dL — ABNORMAL LOW (ref 13.0–18.0)
MCH: 29.5 pg (ref 26.0–34.0)
MCHC: 32 g/dL (ref 32.0–36.0)
MCV: 92 fL (ref 80.0–100.0)
PLATELETS: 168 10*3/uL (ref 150–440)
RBC: 3.33 MIL/uL — AB (ref 4.40–5.90)
RDW: 16.1 % — ABNORMAL HIGH (ref 11.5–14.5)
WBC: 6.4 10*3/uL (ref 3.8–10.6)

## 2015-08-03 LAB — MRSA PCR SCREENING: MRSA BY PCR: NEGATIVE

## 2015-08-03 LAB — PHOSPHORUS: Phosphorus: 3.1 mg/dL (ref 2.5–4.6)

## 2015-08-03 MED ORDER — IRBESARTAN 300 MG PO TABS: 300.0000 mg | ORAL_TABLET | Freq: Every evening | ORAL | Status: DC

## 2015-08-03 MED ORDER — NICOTINE 14 MG/24HR TD PT24: 14.0000 mg | MEDICATED_PATCH | Freq: Every day | TRANSDERMAL | Status: DC

## 2015-08-03 MED ORDER — IRBESARTAN 75 MG PO TABS
300.0000 mg | ORAL_TABLET | Freq: Every evening | ORAL | Status: DC
  Administered 2015-08-03 – 2015-08-04 (×2): 300 mg via ORAL
  Filled 2015-08-03 (×2): qty 4

## 2015-08-03 MED ORDER — HYDRALAZINE HCL 20 MG/ML IJ SOLN: 10.0000 mg | INTRAMUSCULAR | Status: DC | PRN

## 2015-08-03 MED ORDER — LIDOCAINE-PRILOCAINE 2.5-2.5 % EX CREA
TOPICAL_CREAM | Freq: Every day | CUTANEOUS | Status: DC | PRN
  Administered 2015-08-03: 12:00:00 via TOPICAL
  Filled 2015-08-03 (×2): qty 5

## 2015-08-03 NOTE — Progress Notes (Signed)
Charlie RN at bedside providing dialysis treatment.

## 2015-08-03 NOTE — Progress Notes (Signed)
Upon arrival to ICU pts BP 192/114, o2 saturation 88%, dyspneic at 40 breaths per minute and violently coughing coupled with episodes of heaving. Pt was placed on 4L [place] and given 5 mg metoprolol as ordered. Paged Dr. Candiss Norse who gave verbal for BiPap and to start a nitroglycerin drip titrated to keep systloic <160. Will continue to reassess.

## 2015-08-03 NOTE — Progress Notes (Signed)
Pre dialysis assessment 

## 2015-08-03 NOTE — Consult Note (Signed)
[facility] Consultation      Name: [name] MRN: [id number] DOB: April 10, 1955    ADMISSION DATE:  08/02/2015   CHIEF COMPLAINT:  Acute resp failure  HISTORY OF PRESENT ILLNESS   This is a 60 year old male with end-stage renal disease who dialyzes Monday Wednesdays and Fridays. He had his dialysis on Friday but woke up last night with acute SOB-then evaluated in ER, patient in resp distress with elevated BP, patient started on nitro infusion and biPAP   chest x-ray c/w pulm edema 05/07/14 confirms patient using cocaine-will order drug screen Unable to give ROS     PAST MEDICAL HISTORY    :  Past Medical History  Diagnosis Date  . Peripheral vascular disease ([place])   . ESRD (end stage renal disease) ([place])   . HTN (hypertension) 08/02/2015   Past Surgical History  Procedure Laterality Date  . Av fistula placement Left 2014  . Peripheral vascular catheterization N/A 10/03/2014    Procedure: A/V Shuntogram/Fistulagram;  Surgeon: Algernon Huxley, MD;  Location: [place] [facility];  Service: Cardiovascular;  Laterality: N/A;  . Peripheral vascular catheterization Left 10/03/2014    Procedure: A/V Shunt Intervention;  Surgeon: Algernon Huxley, MD;  Location: [place] [facility];  Service: Cardiovascular;  Laterality: Left;  . Peripheral vascular catheterization Left 05/01/2015    Procedure: A/V Shuntogram/Fistulagram;  Surgeon: Algernon Huxley, MD;  Location: [place] [facility];  Service: Cardiovascular;  Laterality: Left;  . Peripheral vascular catheterization N/A 05/01/2015    Procedure: A/V Shunt Intervention;  Surgeon: Algernon Huxley, MD;  Location: [place] [facility];  Service: Cardiovascular;  Laterality: N/A;   Prior to Admission medications   Medication Sig Start Date End Date Taking? Authorizing Provider  amLODipine (NORVASC) 10 MG tablet Take 10 mg by mouth daily.   Yes Historical Provider, MD  calcium acetate, Phos Binder, (PHOSLYRA) 667 MG/5ML SOLN Take  2,001 mg by mouth 3 (three) times daily with meals. Reported on 05/01/2015   Yes Historical Provider, MD  cinacalcet (SENSIPAR) 90 MG tablet Take 90 mg by mouth daily.   Yes Historical Provider, MD  enalapril (VASOTEC) 10 MG tablet Take 10 mg by mouth daily.   Yes Historical Provider, MD  hydrALAZINE (APRESOLINE) 25 MG tablet Take 25 mg by mouth 3 (three) times daily.   Yes Historical Provider, MD  irbesartan (AVAPRO) 300 MG tablet Take 1 tablet (300 mg total) by mouth every evening. 08/03/15   Bettey Costa, MD  nicotine (NICODERM CQ - DOSED IN MG/24 HOURS) 14 mg/24hr patch Place 1 patch (14 mg total) onto the skin daily. 08/03/15   Bettey Costa, MD   Allergies  Allergen Reactions  . Sulfa Antibiotics Rash     FAMILY HISTORY   Family History  Problem Relation Age of Onset  . Hypertension Son       SOCIAL HISTORY    reports that he has been smoking Cigarettes.  He has a 20 pack-year smoking history. He has never used smokeless tobacco. He reports that he does not drink alcohol or use illicit drugs.  Review of Systems  Unable to perform ROS: critical illness      VITAL SIGNS    Temp:  [97.6 F (36.4 C)] 97.6 F (36.4 C) (06/10 2033) Pulse Rate:  [71-97] 71 (06/11 0500) Resp:  [15-36] 15 (06/11 0500) BP: (101-208)/(73-178) 168/95 mmHg (06/11 0500) SpO2:  [93 %-100 %] 99 % (06/11 0500) FiO2 (%):  [40 %] 40 % (  06/11 0436) Weight:  [180 lb (81.647 kg)] 180 lb (81.647 kg) (06/10 2033) HEMODYNAMICS:   VENTILATOR SETTINGS: Vent Mode:  [-]  FiO2 (%):  [40 %] 40 % INTAKE / OUTPUT:  Intake/Output Summary (Last 24 hours) at 08/03/15 1004 Last data filed at 08/03/15 0400  Gross per 24 hour  Intake  52.35 ml  Output    100 ml  Net -47.65 ml       PHYSICAL EXAM   Physical Exam  Constitutional: He appears distressed.  HENT:  Head: Normocephalic and atraumatic.  Eyes: Pupils are equal, round, and reactive to light. No scleral icterus.  Neck: Normal range of motion. Neck  supple.  Cardiovascular: Normal rate, regular rhythm and normal heart sounds.   No murmur heard. Pulmonary/Chest: He is in respiratory distress. He has no wheezes. He has rales.  resp distress  Abdominal: Soft. He exhibits no distension. There is no tenderness.  Musculoskeletal: He exhibits no edema.  Neurological: He displays normal reflexes. Coordination normal.  Lethargic, but arousable  Skin: Skin is warm. No rash noted. He is diaphoretic.       LABS   LABS:  CBC  Recent Labs Lab 08/02/15 2050 08/03/15 0408  WBC 6.5 6.4  HGB 11.4* 9.8*  HCT 35.1* 30.6*  PLT 190 168   Coag's No results for input(s): APTT, INR in the last 168 hours. BMET  Recent Labs Lab 08/02/15 2050 08/03/15 0408  NA 139 140  K 5.1 5.9*  CL 107 109  CO2 21* 20*  BUN 47* 52*  CREATININE 8.69* 9.45*  GLUCOSE 83 79   Electrolytes  Recent Labs Lab 08/02/15 2050 08/03/15 0408  CALCIUM 7.2* 6.7*   Sepsis Markers No results for input(s): LATICACIDVEN, PROCALCITON, O2SATVEN in the last 168 hours. ABG No results for input(s): PHART, PCO2ART, PO2ART in the last 168 hours. Liver Enzymes  Recent Labs Lab 08/02/15 2050  AST 25  ALT 27  ALKPHOS 104  BILITOT 0.3  ALBUMIN 3.6   Cardiac Enzymes  Recent Labs Lab 08/02/15 2050  TROPONINI 0.08*   Glucose  Recent Labs Lab 08/02/15 2314  GLUCAP 103*     Recent Results (from the past 240 hour(s))  MRSA PCR Screening     Status: None   Collection Time: 08/03/15 12:05 AM  Result Value Ref Range Status   MRSA by PCR NEGATIVE NEGATIVE Final    Comment:        The GeneXpert MRSA Assay (FDA approved for NASAL specimens only), is one component of a comprehensive MRSA colonization surveillance program. It is not intended to diagnose MRSA infection nor to guide or monitor treatment for MRSA infections.      Current facility-administered medications:  .  acetaminophen (TYLENOL) tablet 650 mg, 650 mg, Oral, Q6H PRN **OR**  acetaminophen (TYLENOL) suppository 650 mg, 650 mg, Rectal, Q6H PRN, Debby Crosley, MD .  amLODipine (NORVASC) tablet 10 mg, 10 mg, Oral, Daily, Debby Crosley, MD .  bisacodyl (DULCOLAX) EC tablet 5 mg, 5 mg, Oral, Daily PRN, Quintella Baton, MD .  calcium acetate (Phos Binder) (PHOSLYRA) 667 MG/5ML oral solution 2,001 mg, 2,001 mg, Oral, TID WC, Debby Crosley, MD, 2,001 mg at 08/03/15 0842 .  cinacalcet (SENSIPAR) tablet 90 mg, 90 mg, Oral, Q breakfast, Debby Crosley, MD, 90 mg at 08/03/15 0855 .  furosemide (LASIX) injection 80 mg, 80 mg, Intravenous, Q12H, Debby Crosley, MD, 80 mg at 08/02/15 2353 .  heparin injection 5,000 Units, 5,000 Units, Subcutaneous, Q8H, Quintella Baton, MD,  5,000 Units at 08/03/15 [id number] .  hydrALAZINE (APRESOLINE) tablet 25 mg, 25 mg, Oral, TID, Debby Crosley, MD, 25 mg at 08/02/15 2353 .  ipratropium-albuterol (DUONEB) 0.5-2.5 (3) MG/3ML nebulizer solution 3 mL, 3 mL, Nebulization, Q4H PRN, Debby Crosley, MD .  irbesartan (AVAPRO) tablet 300 mg, 300 mg, Oral, QPM, Harmeet Singh, MD .  lidocaine-prilocaine (EMLA) cream, , Topical, Daily PRN, Murlean Iba, MD .  metoprolol (LOPRESSOR) injection 10 mg, 10 mg, Intravenous, Q6H PRN, Quintella Baton, MD, 5 mg at 08/02/15 2324 .  nicotine (NICODERM CQ - dosed in mg/24 hours) patch 14 mg, 14 mg, Transdermal, Daily, Debby Crosley, MD .  nitroGLYCERIN 50 mg in dextrose 5 % 250 mL (0.2 mg/mL) infusion, 0-200 mcg/min, Intravenous, Titrated, Harmeet Singh, MD, Last Rate: 12 mL/hr at 08/03/15 0015, 40 mcg/min at 08/03/15 0015 .  ondansetron (ZOFRAN) tablet 4 mg, 4 mg, Oral, Q6H PRN **OR** ondansetron (ZOFRAN) injection 4 mg, 4 mg, Intravenous, Q6H PRN, Debby Crosley, MD .  sodium chloride flush (NS) 0.9 % injection 3 mL, 3 mL, Intravenous, Q12H, Debby Crosley, MD, 3 mL at 08/02/15 2354  IMAGING    Dg Chest 2 View  08/02/2015  CLINICAL DATA:  Acute onset of shortness of breath and cough. Initial encounter. EXAM: CHEST  2 VIEW  COMPARISON:  Chest radiograph performed 06/05/2015 FINDINGS: The lungs are well-aerated. Vascular congestion is noted. Peribronchial thickening is seen. Mildly increased interstitial markings could reflect minimal interstitial edema. There is no evidence of pleural effusion or pneumothorax. The heart is enlarged.  No acute osseous abnormalities are seen. IMPRESSION: Vascular congestion and cardiomegaly. Peribronchial thickening seen. Mildly increased interstitial markings could reflect minimal interstitial edema. Electronically Signed   By: Garald Balding M.D.   On: 08/02/2015 21:23     ASSESSMENT/PLAN  60 yo AAM admitted to ICU for acute resp failure from acute HTN emergency highly suspicious for cocaine abuse, WITH ESRD  PULMONARY -Respiratory Failure -biPAP, WEAN  FIO2 AS tOLERATED -START Bronchodilator Therapy   CARDIOVASCULAR HTN EMERGENCY -WEAN OFF NITRO INFUSION -LABETOLOL AND HYDRALAZINE AS NEEDED -CHECK DRUG SCREEN  RENAL DIALYSIS TODAY  GASTROINTESTINAL NPO FOR NOW  HEMATOLOGIC FOLLOW CBC  INFECTIOUS NO NEED FOR ABX    I have personally obtained a history, examined the patient, evaluated laboratory and independently reviewed  imaging results, formulated the assessment and plan and placed orders.  The Patient requires high complexity decision making for assessment and support, frequent evaluation and titration of therapies, application of advanced monitoring technologies and extensive interpretation of multiple databases. Critical Care Time devoted to patient care services described in this note is 45 minutes.   Overall, patient is critically ill, prognosis is guarded. Patient at high risk for cardiac arrest and death.    Corrin Parker, M.D.  [facility]  Medical Director [place] Director [facility]

## 2015-08-03 NOTE — Progress Notes (Signed)
POST DIALYSIS ASSESSMENT 

## 2015-08-03 NOTE — Progress Notes (Signed)
Subjective:   Patient known to our practice from outpatient Presented for SOB and HTN urgency Placed on NTG drip and BiPAP last night with improvement in BP SOB is a little better  Patient states he did not miss any treatment Ate breakfast this morning   Objective:  Vital signs in last 24 hours:  Temp:  [97.6 F (36.4 C)] 97.6 F (36.4 C) (06/10 2033) Pulse Rate:  [71-97] 71 (06/11 0500) Resp:  [15-36] 15 (06/11 0500) BP: (101-208)/(73-178) 168/95 mmHg (06/11 0500) SpO2:  [93 %-100 %] 99 % (06/11 0500) FiO2 (%):  [40 %] 40 % (06/11 0436) Weight:  [81.647 kg (180 lb)] 81.647 kg (180 lb) (06/10 2033)  Weight change:  Filed Weights   08/02/15 2033  Weight: 81.647 kg (180 lb)    Intake/Output:    Intake/Output Summary (Last 24 hours) at 08/03/15 0916 Last data filed at 08/03/15 0400  Gross per 24 hour  Intake  52.35 ml  Output    100 ml  Net -47.65 ml     Physical Exam: General: NAD, Laying in bed  HEENT NIPPV mask present  Neck supple  Pulm/lungs Diffuse mild wheezing, mild crackles at bases  CVS/Heart Regular, No rub  Abdomen:  Soft, NT  Extremities: No peripheral edema  Neurologic: Alert, oriented  Skin: No acute rashes  Access: Left arm AVF       Basic Metabolic Panel:   Recent Labs Lab 08/02/15 2050 08/03/15 0408  NA 139 140  K 5.1 5.9*  CL 107 109  CO2 21* 20*  GLUCOSE 83 79  BUN 47* 52*  CREATININE 8.69* 9.45*  CALCIUM 7.2* 6.7*     CBC:  Recent Labs Lab 08/02/15 2050 08/03/15 0408  WBC 6.5 6.4  HGB 11.4* 9.8*  HCT 35.1* 30.6*  MCV 90.5 92.0  PLT 190 168      Microbiology:  Recent Results (from the past 720 hour(s))  MRSA PCR Screening     Status: None   Collection Time: 08/03/15 12:05 AM  Result Value Ref Range Status   MRSA by PCR NEGATIVE NEGATIVE Final    Comment:        The GeneXpert MRSA Assay (FDA approved for NASAL specimens only), is one component of a comprehensive MRSA colonization surveillance program.  It is not intended to diagnose MRSA infection nor to guide or monitor treatment for MRSA infections.     Coagulation Studies: No results for input(s): LABPROT, INR in the last 72 hours.  Urinalysis: No results for input(s): COLORURINE, LABSPEC, PHURINE, GLUCOSEU, HGBUR, BILIRUBINUR, KETONESUR, PROTEINUR, UROBILINOGEN, NITRITE, LEUKOCYTESUR in the last 72 hours.  Invalid input(s): APPERANCEUR    Imaging: Dg Chest 2 View  08/02/2015  CLINICAL DATA:  Acute onset of shortness of breath and cough. Initial encounter. EXAM: CHEST  2 VIEW COMPARISON:  Chest radiograph performed 06/05/2015 FINDINGS: The lungs are well-aerated. Vascular congestion is noted. Peribronchial thickening is seen. Mildly increased interstitial markings could reflect minimal interstitial edema. There is no evidence of pleural effusion or pneumothorax. The heart is enlarged.  No acute osseous abnormalities are seen. IMPRESSION: Vascular congestion and cardiomegaly. Peribronchial thickening seen. Mildly increased interstitial markings could reflect minimal interstitial edema. Electronically Signed   By: Garald Balding M.D.   On: 08/02/2015 21:23     Medications:   . nitroGLYCERIN 40 mcg/min (08/03/15 0015)   . amLODipine  10 mg Oral Daily  . calcium acetate (Phos Binder)  2,001 mg Oral TID WC  . cinacalcet  90  mg Oral Q breakfast  . enalapril  10 mg Oral Daily  . furosemide  80 mg Intravenous Q12H  . heparin  5,000 Units Subcutaneous Q8H  . hydrALAZINE  25 mg Oral TID  . nicotine  14 mg Transdermal Daily  . sodium chloride flush  3 mL Intravenous Q12H   acetaminophen **OR** acetaminophen, bisacodyl, ipratropium-albuterol, metoprolol, ondansetron **OR** ondansetron (ZOFRAN) IV  Assessment/ Plan:  60 y.o. male with pmhx of hypertension, hepatitis C, ESRD started HD 10/13, anemia of CKD, admission for diarrhea/hyperkalemia 10/2012, admission for GI bleed 10/2012, SHPTH, GI bleed 3/15, seizure episode, admission for  pulmonary edema 9/16, Jun 2017  1. ESRD on HD MWF:  - Urgent HD this AM and tomorrow if still in hospital  2. Acute Pulmonary edema.  - UF as tolerated - ? SOB related more to uncontrolled BP than fluid  3. Secondary hyperparathyroidism. Monitor phos Continue phoslo  sensipar  4. Anemia of chronic kidney disease.  hgb 9.8 Will start procrit with HD on Monday, hopefully after BP is better controlled  5. Hyperkalemia - Low K diet - expected to  Improve with HD   6. Malignant HTN - wean off NTG drip as tolerated - change lisinopril to irbesartan (ARBs not dialyzed out) - can consider clonidine   LOS: 1 Breeann Reposa 6/11/20179:16 AM

## 2015-08-03 NOTE — Progress Notes (Addendum)
[facility] NAME: [name]    MR#:  [id number]  DATE OF BIRTH:  [birth year]  SUBJECTIVE:   Patient Was on BiPAP for shortness of breath. Dates BiPAP helped with his shortness of breath. He underwent dialysis on Friday. He denies chest pain.  REVIEW OF SYSTEMS:    Review of Systems  Constitutional: Negative for fever, chills and malaise/fatigue.  HENT: Negative for ear discharge, ear pain, hearing loss, nosebleeds and sore throat.   Eyes: Negative for blurred vision and pain.  Respiratory: Positive for shortness of breath. Negative for cough, hemoptysis and wheezing.   Cardiovascular: Negative for chest pain, palpitations and leg swelling.  Gastrointestinal: Negative for nausea, vomiting, abdominal pain, diarrhea and blood in stool.  Genitourinary: Negative for dysuria.  Musculoskeletal: Negative for back pain.  Neurological: Negative for dizziness, tremors, speech change, focal weakness, seizures and headaches.  Endo/Heme/Allergies: Does not bruise/bleed easily.  Psychiatric/Behavioral: Negative for depression, suicidal ideas and hallucinations.    Tolerating Diet:yes      DRUG ALLERGIES:   Allergies  Allergen Reactions  . Sulfa Antibiotics Rash    VITALS:  Blood pressure 140/77, pulse 71, temperature 97.6 F (36.4 C), temperature source Oral, resp. rate 19, height 5\' 8"  (1.727 m), weight 81.647 kg (180 lb), SpO2 100 %.  PHYSICAL EXAMINATION:   Physical Exam  Constitutional: He is oriented to person, place, and time and well-developed, well-nourished, and in no distress. No distress.  HENT:  Head: Normocephalic.  Eyes: No scleral icterus.  Neck: Normal range of motion. Neck supple. No JVD present. No tracheal deviation present.  Cardiovascular: Normal rate, regular rhythm and normal heart sounds.  Exam reveals no gallop and no friction rub.   No murmur heard. Pulmonary/Chest: Effort normal and breath sounds  normal. No respiratory distress. He has no wheezes. He has no rales. He exhibits no tenderness.  Crackles   Abdominal: Soft. Bowel sounds are normal. He exhibits no distension and no mass. There is no tenderness. There is no rebound and no guarding.  Musculoskeletal: Normal range of motion. He exhibits no edema.  Neurological: He is alert and oriented to person, place, and time.  Skin: Skin is warm. No rash noted. No erythema.  Psychiatric: Affect and judgment normal.      LABORATORY PANEL:   CBC  Recent Labs Lab 08/03/15 0408  WBC 6.4  HGB 9.8*  HCT 30.6*  PLT 168   ------------------------------------------------------------------------------------------------------------------  Chemistries   Recent Labs Lab 08/02/15 2050 08/03/15 0408  NA 139 140  K 5.1 5.9*  CL 107 109  CO2 21* 20*  GLUCOSE 83 79  BUN 47* 52*  CREATININE 8.69* 9.45*  CALCIUM 7.2* 6.7*  AST 25  --   ALT 27  --   ALKPHOS 104  --   BILITOT 0.3  --    ------------------------------------------------------------------------------------------------------------------  Cardiac Enzymes  Recent Labs Lab 08/02/15 2050  TROPONINI 0.08*   ------------------------------------------------------------------------------------------------------------------  RADIOLOGY:  Dg Chest 2 View  08/02/2015  CLINICAL DATA:  Acute onset of shortness of breath and cough. Initial encounter. EXAM: CHEST  2 VIEW COMPARISON:  Chest radiograph performed 06/05/2015 FINDINGS: The lungs are well-aerated. Vascular congestion is noted. Peribronchial thickening is seen. Mildly increased interstitial markings could reflect minimal interstitial edema. There is no evidence of pleural effusion or pneumothorax. The heart is enlarged.  No acute osseous abnormalities are seen. IMPRESSION: Vascular congestion and cardiomegaly. Peribronchial thickening seen. Mildly increased interstitial markings could reflect minimal  interstitial edema.  Electronically Signed   By: Garald Balding M.D.   On: 08/02/2015 21:23     ASSESSMENT AND PLAN:    60 year old male with a history of ESRD on hemodialysis and systolic heart failure EF 30-35% by echo in 2016 presents with acute hypoxic history failure.  1. Acute hypoxic respiratory failure: Patient will continue with BiPAP when necessary and will need to undergo hemodialysis. Patient makes very minimal urine, but we'll continue with IV Lasix for now.  2. ESRD on hemodialysis: Patient will undergo urgent hemodialysis this a.m.  3. Acute on chronic systolic and diastolic heart failure: She will need to undergo hemodialysis.  4. Malignant hypertension: This is likely etiology of shortness of breath and possibly causing pulmonary edema. Wean nitroglycerin Continue Norvasc, hydralazine, Avapro (which was changed from ACE inhibitor) and when necessary metoprolol.  5. Hyperkalemia: Patient will undergo hemodialysis.   6. Tobacco abuse: Patient states he smokes one to 2 cigarettes per day. Patient's continence subsequently. Patient counseled for 3 minutes.  Management plans discussed with the patient and he is in agreement.  CODE STATUS: FULL  CRITICAL CARE  TOTAL TIME TAKING CARE OF THIS PATIENT: 30 minutes.     POSSIBLE D/C tomorrow, DEPENDING ON CLINICAL CONDITION.   Renesmay Nesbitt M.D on 08/03/2015 at 11:13 AM  Between 7am to 6pm - Pager - [phone] After 6pm go to [email or web address] - password [email or web address] [facility]  Office  [phone]  CC: Primary care physician; Pcp Not In System  Note: This dictation was prepared with Dragon dictation along with smaller phrase technology. Any transcriptional errors that result from this process are unintentional.

## 2015-08-03 NOTE — Progress Notes (Signed)
HEMODIALYSIS COMLETED

## 2015-08-03 NOTE — Progress Notes (Signed)
Started hemodialysis

## 2015-08-04 ENCOUNTER — Inpatient Hospital Stay: Payer: Medicare Other

## 2015-08-04 DIAGNOSIS — I10 Essential (primary) hypertension: Secondary | ICD-10-CM

## 2015-08-04 LAB — BASIC METABOLIC PANEL
Anion gap: 11 (ref 5–15)
BUN: 25 mg/dL — AB (ref 6–20)
CALCIUM: 7.3 mg/dL — AB (ref 8.9–10.3)
CO2: 28 mmol/L (ref 22–32)
CREATININE: 5.99 mg/dL — AB (ref 0.61–1.24)
Chloride: 101 mmol/L (ref 101–111)
GFR calc Af Amer: 11 mL/min — ABNORMAL LOW (ref >60)
GFR, EST NON AFRICAN AMERICAN: 9 mL/min — AB (ref >60)
GLUCOSE: 115 mg/dL — AB (ref 65–99)
Potassium: 3.9 mmol/L (ref 3.5–5.1)
Sodium: 140 mmol/L (ref 135–145)

## 2015-08-04 LAB — CBC
HCT: 32.3 % — ABNORMAL LOW (ref 40.0–52.0)
Hemoglobin: 10.7 g/dL — ABNORMAL LOW (ref 13.0–18.0)
MCH: 30.2 pg (ref 26.0–34.0)
MCHC: 33 g/dL (ref 32.0–36.0)
MCV: 91.5 fL (ref 80.0–100.0)
Platelets: 159 10*3/uL (ref 150–440)
RBC: 3.53 MIL/uL — ABNORMAL LOW (ref 4.40–5.90)
RDW: 15.5 % — AB (ref 11.5–14.5)
WBC: 6.5 10*3/uL (ref 3.8–10.6)

## 2015-08-04 LAB — MAGNESIUM: MAGNESIUM: 1.8 mg/dL (ref 1.7–2.4)

## 2015-08-04 LAB — PHOSPHORUS: PHOSPHORUS: 3.9 mg/dL (ref 2.5–4.6)

## 2015-08-04 LAB — HEPATITIS B SURFACE ANTIGEN: HEP B S AG: NEGATIVE

## 2015-08-04 MED ORDER — CALCIUM ACETATE (PHOS BINDER) 667 MG PO CAPS
2001.0000 mg | ORAL_CAPSULE | Freq: Three times a day (TID) | ORAL | Status: DC
  Administered 2015-08-04: 2001 mg via ORAL
  Filled 2015-08-04 (×2): qty 3

## 2015-08-04 NOTE — Progress Notes (Signed)
Per Dr. Alva Garnet patient may travel to dialysis unit without RN.  Patient will be discharged home after dialysis today.

## 2015-08-04 NOTE — Progress Notes (Signed)
After paging Dr. Benjie Karvonen and calling her cell phone, leaving a voice message with MD not calling RN back, RN paged Dr. Boyce Medici and spoke with MD about patients prescriptions being sent to wrong pharmacy and needing to be called into [facility]. Dr. Boyce Medici called in patients prescriptions to [facility].

## 2015-08-04 NOTE — Progress Notes (Signed)
Tx complete  

## 2015-08-04 NOTE — Progress Notes (Addendum)
Initial Heart Failure Clinic appointment scheduled for September 02, 2015 at 10:30am. Thank you.

## 2015-08-04 NOTE — Progress Notes (Signed)
Patient left ICU by wheelchair with Raiford Noble, RN.  Patient A&Ox4 with no distress noted. Daughter taking patient home.

## 2015-08-04 NOTE — Discharge Instructions (Signed)
Heart Failure Clinic appointment on September 02, 2015 at 10:30am with Darylene Price, [place]. Please call [phone] to reschedule.

## 2015-08-04 NOTE — Clinical Social Work Note (Signed)
CSW contacted patient's son: [place]: [phone] regarding transportation and he stated that he was in [place] and that he is a Administrator. Devon stated that we could call patient's daughter: Kristine Garbe at [phone] and she should be able to transport. CSW called that number and left a message. Shela Leff MSW,LcSW [phone]

## 2015-08-04 NOTE — Progress Notes (Signed)
Subjective:  Pt seen at bedside.  Doing well at the moment. No shortness of breath.  Needs dialysis today.   Objective:  Vital signs in last 24 hours:  Temp:  [98.5 F (36.9 C)-99.2 F (37.3 C)] 98.9 F (37.2 C) (06/12 0800) Pulse Rate:  [65-93] 78 (06/12 0900) Resp:  [9-33] 17 (06/12 0842) BP: (126-188)/(55-149) 153/68 mmHg (06/12 0900) SpO2:  [91 %-100 %] 97 % (06/12 0900) FiO2 (%):  [40 %] 40 % (06/11 2055) Weight:  [80.1 kg (176 lb 9.4 oz)] 80.1 kg (176 lb 9.4 oz) (06/11 1230)  Weight change: -1.547 kg (-3 lb 6.6 oz) Filed Weights   08/02/15 2033 08/03/15 1230  Weight: 81.647 kg (180 lb) 80.1 kg (176 lb 9.4 oz)    Intake/Output:    Intake/Output Summary (Last 24 hours) at 08/04/15 1008 Last data filed at 08/04/15 0952  Gross per 24 hour  Intake 401.85 ml  Output   2400 ml  Net -1998.15 ml     Physical Exam: General: NAD, Laying in bed  HEENT [place]/AT EOMI hearing intact OM moist  Neck supple  Pulm/lungs Basilar rales, normal respiratory effort  CVS/Heart Regular, No rub  Abdomen:  Soft, NTND, BS  Extremities: No peripheral edema  Neurologic: Alert, oriented, follows commands  Skin: No acute rashes  Access: Left arm AVF       Basic Metabolic Panel:   Recent Labs Lab 08/02/15 2050 08/03/15 0408 08/03/15 1354 08/04/15 0335  NA 139 140  --  140  K 5.1 5.9*  --  3.9  CL 107 109  --  101  CO2 21* 20*  --  28  GLUCOSE 83 79  --  115*  BUN 47* 52*  --  25*  CREATININE 8.69* 9.45*  --  5.99*  CALCIUM 7.2* 6.7*  --  7.3*  MG  --   --   --  1.8  PHOS  --   --  3.1 3.9     CBC:  Recent Labs Lab 08/02/15 2050 08/03/15 0408 08/04/15 0335  WBC 6.5 6.4 6.5  HGB 11.4* 9.8* 10.7*  HCT 35.1* 30.6* 32.3*  MCV 90.5 92.0 91.5  PLT 190 168 159      Microbiology:  Recent Results (from the past 720 hour(s))  MRSA PCR Screening     Status: None   Collection Time: 08/03/15 12:05 AM  Result Value Ref Range Status   MRSA by PCR NEGATIVE NEGATIVE  Final    Comment:        The GeneXpert MRSA Assay (FDA approved for NASAL specimens only), is one component of a comprehensive MRSA colonization surveillance program. It is not intended to diagnose MRSA infection nor to guide or monitor treatment for MRSA infections.     Coagulation Studies: No results for input(s): LABPROT, INR in the last 72 hours.  Urinalysis: No results for input(s): COLORURINE, LABSPEC, PHURINE, GLUCOSEU, HGBUR, BILIRUBINUR, KETONESUR, PROTEINUR, UROBILINOGEN, NITRITE, LEUKOCYTESUR in the last 72 hours.  Invalid input(s): APPERANCEUR    Imaging: Dg Chest 2 View  08/02/2015  CLINICAL DATA:  Acute onset of shortness of breath and cough. Initial encounter. EXAM: CHEST  2 VIEW COMPARISON:  Chest radiograph performed 06/05/2015 FINDINGS: The lungs are well-aerated. Vascular congestion is noted. Peribronchial thickening is seen. Mildly increased interstitial markings could reflect minimal interstitial edema. There is no evidence of pleural effusion or pneumothorax. The heart is enlarged.  No acute osseous abnormalities are seen. IMPRESSION: Vascular congestion and cardiomegaly. Peribronchial thickening seen. Mildly  increased interstitial markings could reflect minimal interstitial edema. Electronically Signed   By: Garald Balding M.D.   On: 08/02/2015 21:23   Dg Chest Port 1 View  08/04/2015  CLINICAL DATA:  Respiratory failure. EXAM: PORTABLE CHEST 1 VIEW COMPARISON:  08/02/2015. FINDINGS: Mediastinum and hilar structures are normal. Cardiomegaly again noted. Improvement of pulmonary interstitial prominence consistent improving congestive heart failure. Tiny left pleural effusion cannot be excluded. No pneumothorax. IMPRESSION: Cardiomegaly with interim improvement of bilateral interstitial prominence consistent with improving congestive heart failure. Small left pleural effusion cannot be excluded. Electronically Signed   By: Marcello Moores  Register   On: 08/04/2015 07:14      Medications:     . amLODipine  10 mg Oral Daily  . calcium acetate  2,001 mg Oral TID WC  . cinacalcet  90 mg Oral Q breakfast  . heparin  5,000 Units Subcutaneous Q8H  . hydrALAZINE  25 mg Oral TID  . irbesartan  300 mg Oral QPM  . nicotine  14 mg Transdermal Daily  . sodium chloride flush  3 mL Intravenous Q12H   acetaminophen **OR** acetaminophen, bisacodyl, hydrALAZINE, ipratropium-albuterol, lidocaine-prilocaine, metoprolol, ondansetron **OR** ondansetron (ZOFRAN) IV  Assessment/ Plan:  60 y.o. male with pmhx of hypertension, hepatitis C, ESRD started HD 10/13, anemia of CKD, admission for diarrhea/hyperkalemia 10/2012, admission for GI bleed 10/2012, SHPTH, GI bleed 3/15, seizure episode, admission for pulmonary edema 9/16, Jun 2017  1. ESRD on HD MWF:  - Pt due for HD today,will prepare orders.   2. Acute Pulmonary edema.  - improved, will plan for HD today with UF target of 2.5kg.   3. Secondary hyperparathyroidism. Phos under good control, continue calcium acetate 2001mg  po tid/wm as well as sensipar.  4. Anemia of chronic kidney disease.  hgb 10.7, continue epogen as an outpt.   5. Hyperkalemia - K down to 4.0 and under good control.  6. Malignant HTN - likely due to cocaine, blood pressure currently under fairly good control for him at 153/68, continue amlodipine, hydralazine, and irbesartan.   LOS: 2 [name] 6/12/201710:08 AM

## 2015-08-04 NOTE — Progress Notes (Signed)
Post dialysis 

## 2015-08-04 NOTE — Progress Notes (Addendum)
BP controlled off all infusions No distress on RA CXR is without edema  He is ready for transfer out of ICU and perhaps ready for DC home  PCCM will sign off. Please call if we can be of further assistance  Merton Border, MD PCCM service Mobile [phone] Pager [phone] 08/04/2015

## 2015-08-04 NOTE — Clinical Social Work Note (Signed)
CSW asked by nurse to provide taxi voucher for patient. When CSW arrived to the ICU unit, patient was down in dialysis. CSW will contact patient's son to see if he can provide transportation. Shela Leff MSW,LCSW [phone]

## 2015-08-04 NOTE — Clinical Social Work Note (Signed)
CSW spoke with patient in the dialysis unit this afternoon regarding transportation. Patient states that his daughter, Kristine Garbe is at work currently and that when she gets off she has to go to the daycare and pick up her 3 young children and then she may be able to come transport him home. CSW has informed nursing that the taxi voucher is a last resort and that they are to try to contact patient's daughter prior to calling the taxi for patient. Patient states that if he does go by taxi, he is ambulatory and has a key to get into his home. Shela Leff MSW,LCSW [phone]

## 2015-08-04 NOTE — Progress Notes (Signed)
Dialysis started 

## 2015-08-04 NOTE — Progress Notes (Signed)
Pre Dialysis 

## 2015-08-04 NOTE — Progress Notes (Signed)
RN spoke with Dr. Benjie Karvonen and made MD aware that patient just returned from dialysis and tolerated well but blood pressure is 152/97.  RN just gave patient his evening dose of hydralazine and avapro.  RN asked if it was okay to continue with discharging patient at this time. Dr. Benjie Karvonen stated "yes he can go ahead and go home now."

## 2015-08-04 NOTE — Discharge Summary (Signed)
[facility] [place] NAME: [name]    MR#:  [id number]  DATE OF BIRTH:  [birth year]  DATE OF ADMISSION:  08/02/2015 ADMITTING PHYSICIAN: Quintella Baton, MD  DATE OF DISCHARGE: 08/04/2015  PRIMARY CARE PHYSICIAN: [facility]    ADMISSION DIAGNOSIS:  Acute pulmonary edema ([place]) [J81.0]  DISCHARGE DIAGNOSIS:  Principal Problem:   Fluid overload Active Problems:   ESRD (end stage renal disease) (HCC)   HTN (hypertension)   SECONDARY DIAGNOSIS:   Past Medical History  Diagnosis Date  . Peripheral vascular disease ([place])   . ESRD (end stage renal disease) ([place])   . HTN (hypertension) 08/02/2015    HOSPITAL COURSE:   60 year old male with a history of ESRD on hemodialysis and systolic heart failure EF 30-35% by echo in 2016 presents with acute hypoxic history failure.  1. Acute hypoxic respiratory failure: Patient feeling better this am after yesterday's HD.  2. ESRD on hemodialysis: Patient will undergo urgent hemodialysis this a.m.as his routine schedule  3. Acute on chronic systolic and diastolic heart failure: He has outpatient follow up with heart failure clinic. Continue AVAPRO and if BP/HR allows then add BB as outpatient.  4. Malignant hypertension: This is likely etiology of shortness of breath and possibly causing pulmonary edema. Wean nitroglycerin Continue Norvasc, hydralazine, Avapro (which was changed from ACE inhibitor) and when necessary metoprolol.  5. Hyperkalemia: Improved with hemodialysis.   6. Tobacco abuse: Patient states he smokes one to 2 cigarettes per day. Patient's continence subsequently. Patient counseled for 3 minutes.   DISCHARGE CONDITIONS AND DIET:   Stable for discharge on renal diet  CONSULTS OBTAINED:  Treatment Team:  Murlean Iba, MD  DRUG ALLERGIES:   Allergies  Allergen Reactions  . Sulfa Antibiotics Rash    DISCHARGE MEDICATIONS:   Current Discharge Medication List     START taking these medications   Details  irbesartan (AVAPRO) 300 MG tablet Take 1 tablet (300 mg total) by mouth every evening. Qty: 30 tablet, Refills: 0    nicotine (NICODERM CQ - DOSED IN MG/24 HOURS) 14 mg/24hr patch Place 1 patch (14 mg total) onto the skin daily. Qty: 28 patch, Refills: 0      CONTINUE these medications which have NOT CHANGED   Details  amLODipine (NORVASC) 10 MG tablet Take 10 mg by mouth daily.    calcium acetate, Phos Binder, (PHOSLYRA) 667 MG/5ML SOLN Take 2,001 mg by mouth 3 (three) times daily with meals. Reported on 05/01/2015    cinacalcet (SENSIPAR) 90 MG tablet Take 90 mg by mouth daily.    hydrALAZINE (APRESOLINE) 25 MG tablet Take 25 mg by mouth 3 (three) times daily.      STOP taking these medications     enalapril (VASOTEC) 10 MG tablet               Today   CHIEF COMPLAINT:   Doing well this morning. Ready for discharge after dialysis. No shortness of breath or chest pain   VITAL SIGNS:  Blood pressure 160/81, pulse 79, temperature 98.7 F (37.1 C), temperature source Oral, resp. rate 17, height 5\' 8"  (1.727 m), weight 80.1 kg (176 lb 9.4 oz), SpO2 100 %.   REVIEW OF SYSTEMS:  Review of Systems  Constitutional: Negative for fever, chills and malaise/fatigue.  HENT: Negative for ear discharge, ear pain, hearing loss, nosebleeds and sore throat.   Eyes: Negative for blurred vision and pain.  Respiratory: Negative for cough, hemoptysis, shortness  of breath and wheezing.   Cardiovascular: Negative for chest pain, palpitations and leg swelling.  Gastrointestinal: Negative for nausea, vomiting, abdominal pain, diarrhea and blood in stool.  Genitourinary: Negative for dysuria.  Musculoskeletal: Negative for back pain.  Neurological: Negative for dizziness, tremors, speech change, focal weakness, seizures and headaches.  Endo/Heme/Allergies: Does not bruise/bleed easily.  Psychiatric/Behavioral: Negative for depression,  suicidal ideas and hallucinations.     PHYSICAL EXAMINATION:  GENERAL:  60 y.o.-year-old patient lying in the bed with no acute distress.  NECK:  Supple, no jugular venous distention. No thyroid enlargement, no tenderness.  LUNGS: Normal breath sounds bilaterally, no wheezing, rales,rhonchi  No use of accessory muscles of respiration.  CARDIOVASCULAR: S1, S2 normal. No murmurs, rubs, or gallops.  ABDOMEN: Soft, non-tender, non-distended. Bowel sounds present. No organomegaly or mass.  EXTREMITIES: No pedal edema, cyanosis, or clubbing.  PSYCHIATRIC: The patient is alert and oriented x 3.  SKIN: No obvious rash, lesion, or ulcer.   DATA REVIEW:   CBC  Recent Labs Lab 08/04/15 0335  WBC 6.5  HGB 10.7*  HCT 32.3*  PLT 159    Chemistries   Recent Labs Lab 08/02/15 2050  08/04/15 0335  NA 139  < > 140  K 5.1  < > 3.9  CL 107  < > 101  CO2 21*  < > 28  GLUCOSE 83  < > 115*  BUN 47*  < > 25*  CREATININE 8.69*  < > 5.99*  CALCIUM 7.2*  < > 7.3*  MG  --   --  1.8  AST 25  --   --   ALT 27  --   --   ALKPHOS 104  --   --   BILITOT 0.3  --   --   < > = values in this interval not displayed.  Cardiac Enzymes  Recent Labs Lab 08/02/15 2050  TROPONINI 0.08*    Microbiology Results  @[email or web address] @  RADIOLOGY:  Dg Chest 2 View  08/02/2015  CLINICAL DATA:  Acute onset of shortness of breath and cough. Initial encounter. EXAM: CHEST  2 VIEW COMPARISON:  Chest radiograph performed 06/05/2015 FINDINGS: The lungs are well-aerated. Vascular congestion is noted. Peribronchial thickening is seen. Mildly increased interstitial markings could reflect minimal interstitial edema. There is no evidence of pleural effusion or pneumothorax. The heart is enlarged.  No acute osseous abnormalities are seen. IMPRESSION: Vascular congestion and cardiomegaly. Peribronchial thickening seen. Mildly increased interstitial markings could reflect minimal interstitial edema. Electronically Signed    By: Garald Balding M.D.   On: 08/02/2015 21:23   Dg Chest Port 1 View  08/04/2015  CLINICAL DATA:  Respiratory failure. EXAM: PORTABLE CHEST 1 VIEW COMPARISON:  08/02/2015. FINDINGS: Mediastinum and hilar structures are normal. Cardiomegaly again noted. Improvement of pulmonary interstitial prominence consistent improving congestive heart failure. Tiny left pleural effusion cannot be excluded. No pneumothorax. IMPRESSION: Cardiomegaly with interim improvement of bilateral interstitial prominence consistent with improving congestive heart failure. Small left pleural effusion cannot be excluded. Electronically Signed   By: Marcello Moores  Register   On: 08/04/2015 07:14      Management plans discussed with the patient and he is in agreement. Stable for discharge home  Patient should follow up with PCP in 1 week  CODE STATUS:     Code Status Orders        Start     Ordered   08/02/15 2317  Full code   Continuous     08/02/15 2316  Code Status History    Date Active Date Inactive Code Status Order ID Comments User Context   12/02/2014  8:52 PM 12/04/2014  5:53 PM Full Code [id number]  Vaughan Basta, MD Inpatient   11/10/2014 11:23 PM 11/12/2014  3:06 PM Full Code [id number]  Gladstone Lighter, MD Inpatient      TOTAL TIME TAKING CARE OF THIS PATIENT: 35 minutes.    Note: This dictation was prepared with Dragon dictation along with smaller phrase technology. Any transcriptional errors that result from this process are unintentional.  Kirstyn Lean M.D on 08/04/2015 at 12:26 PM  Between 7am to 6pm - Pager - [phone] After 6pm go to [email or web address] - password [email or web address] [facility]  Office  [phone]  CC: Primary care physician; [facility] nephrology

## 2015-08-04 NOTE — Care Management (Signed)
Patient discharging to home today. Weaned to room air. I have notified [facility] dialysis liaison of patient admission/discharge to home today. No anticipated RNCM needs.

## 2015-08-04 NOTE — Progress Notes (Signed)
Discharge instructions given to and reviewed with patient. Patient verbalized understanding of all discharge instructions including changes in home medications and follow up appointment with CHF clinic. Patient informed RN that he gets his meds from [facility] and not [facility].  Patient's pharmacy listed in epic as [facility].  Dr. Benjie Karvonen paged about prescriptions.  Patient insisting on not waiting and going home now.

## 2015-08-04 NOTE — Progress Notes (Signed)
Patient off unit to dialysis.

## 2015-08-05 NOTE — Clinical Social Work Note (Signed)
CSW noted that patient's daughter was able to pick patient up last evening and take him home. No taxi voucher was needed. Shela Leff MSW,LCSW [phone]

## 2015-08-08 LAB — BLOOD GAS, ARTERIAL
Acid-base deficit: 0.2 mmol/L (ref 0.0–2.0)
Bicarbonate: 23.9 mEq/L (ref 21.0–28.0)
Delivery systems: POSITIVE
Expiratory PAP: 5
FIO2: 0.4
INSPIRATORY PAP: 14
LHR: 14 {breaths}/min
O2 Saturation: 99.5 %
Patient temperature: 37
pCO2 arterial: 36 mmHg (ref 32.0–48.0)
pH, Arterial: 7.43 (ref 7.350–7.450)
pO2, Arterial: 158 mmHg — ABNORMAL HIGH (ref 83.0–108.0)

## 2015-08-12 LAB — DRUG SCREEN 10 W/CONF, SERUM
AMPHETAMINES, IA: NEGATIVE ng/mL
Barbiturates, IA: NEGATIVE ug/mL
Benzodiazepines, IA: NEGATIVE ng/mL
COCAINE & METABOLITE, IA: POSITIVE ng/mL
METHADONE, IA: NEGATIVE ng/mL
OPIATES, IA: NEGATIVE ng/mL
OXYCODONES, IA: NEGATIVE ng/mL
PHENCYCLIDINE, IA: NEGATIVE ng/mL
Propoxyphene, IA: NEGATIVE ng/mL
THC(Marijuana) Metabolite, IA: NEGATIVE ng/mL

## 2015-08-12 LAB — COCAINE,MS,WB/SP RFX
Benzoylecgonine: 321 ng/mL
COCAINE CONFIRMATION: POSITIVE
COCAINE: NEGATIVE ng/mL

## 2015-08-28 ENCOUNTER — Ambulatory Visit: Payer: Medicare Other | Admitting: Family

## 2015-09-02 ENCOUNTER — Ambulatory Visit: Payer: Medicare Other | Admitting: Family

## 2015-09-04 ENCOUNTER — Ambulatory Visit: Payer: Medicare Other | Attending: Family | Admitting: Family

## 2015-09-04 ENCOUNTER — Encounter: Payer: Self-pay | Admitting: Family

## 2015-09-04 VITALS — BP 159/94 | HR 76 | Resp 18 | Ht 69.0 in | Wt 172.0 lb

## 2015-09-04 DIAGNOSIS — F17210 Nicotine dependence, cigarettes, uncomplicated: Secondary | ICD-10-CM | POA: Diagnosis not present

## 2015-09-04 DIAGNOSIS — N186 End stage renal disease: Secondary | ICD-10-CM | POA: Insufficient documentation

## 2015-09-04 DIAGNOSIS — I10 Essential (primary) hypertension: Secondary | ICD-10-CM

## 2015-09-04 DIAGNOSIS — I132 Hypertensive heart and chronic kidney disease with heart failure and with stage 5 chronic kidney disease, or end stage renal disease: Secondary | ICD-10-CM | POA: Insufficient documentation

## 2015-09-04 DIAGNOSIS — Z992 Dependence on renal dialysis: Secondary | ICD-10-CM | POA: Diagnosis not present

## 2015-09-04 DIAGNOSIS — I5022 Chronic systolic (congestive) heart failure: Secondary | ICD-10-CM | POA: Diagnosis present

## 2015-09-04 DIAGNOSIS — I739 Peripheral vascular disease, unspecified: Secondary | ICD-10-CM | POA: Insufficient documentation

## 2015-09-04 DIAGNOSIS — R0602 Shortness of breath: Secondary | ICD-10-CM | POA: Insufficient documentation

## 2015-09-04 DIAGNOSIS — Z720 Tobacco use: Secondary | ICD-10-CM

## 2015-09-04 NOTE — Patient Instructions (Signed)
Begin weighing daily and call for an overnight weight gain of > 2 pounds or a weekly weight gain of >5 pounds.   Smoking Cessation Quitting smoking is important to your health and has many advantages. However, it is not always easy to quit since nicotine is a very addictive drug. Oftentimes, people try 3 times or more before being able to quit. This document explains the best ways for you to prepare to quit smoking. Quitting takes hard work and a lot of effort, but you can do it. ADVANTAGES OF QUITTING SMOKING  You will live longer, feel better, and live better.  Your body will feel the impact of quitting smoking almost immediately.  Within 20 minutes, blood pressure decreases. Your pulse returns to its normal level.  After 8 hours, carbon monoxide levels in the blood return to normal. Your oxygen level increases.  After 24 hours, the chance of having a heart attack starts to decrease. Your breath, hair, and body stop smelling like smoke.  After 48 hours, damaged nerve endings begin to recover. Your sense of taste and smell improve.  After 72 hours, the body is virtually free of nicotine. Your bronchial tubes relax and breathing becomes easier.  After 2 to 12 weeks, lungs can hold more air. Exercise becomes easier and circulation improves.  The risk of having a heart attack, stroke, cancer, or lung disease is greatly reduced.  After 1 year, the risk of coronary heart disease is cut in half.  After 5 years, the risk of stroke falls to the same as a nonsmoker.  After 10 years, the risk of lung cancer is cut in half and the risk of other cancers decreases significantly.  After 15 years, the risk of coronary heart disease drops, usually to the level of a nonsmoker.  If you are pregnant, quitting smoking will improve your chances of having a healthy baby.  The people you live with, especially any children, will be healthier.  You will have extra money to spend on things other than  cigarettes. QUESTIONS TO THINK ABOUT BEFORE ATTEMPTING TO QUIT You may want to talk about your answers with your health care provider.  Why do you want to quit?  If you tried to quit in the past, what helped and what did not?  What will be the most difficult situations for you after you quit? How will you plan to handle them?  Who can help you through the tough times? Your family? Friends? A health care provider?  What pleasures do you get from smoking? What ways can you still get pleasure if you quit? Here are some questions to ask your health care provider:  How can you help me to be successful at quitting?  What medicine do you think would be best for me and how should I take it?  What should I do if I need more help?  What is smoking withdrawal like? How can I get information on withdrawal? GET READY  Set a quit date.  Change your environment by getting rid of all cigarettes, ashtrays, matches, and lighters in your home, car, or work. Do not let people smoke in your home.  Review your past attempts to quit. Think about what worked and what did not. GET SUPPORT AND ENCOURAGEMENT You have a better chance of being successful if you have help. You can get support in many ways.  Tell your family, friends, and coworkers that you are going to quit and need their support. Ask them  not to smoke around you.  Get individual, group, or telephone counseling and support. Programs are available at [employer] and health centers. Call your local health department for information about programs in your area.  Spiritual beliefs and practices may help some smokers quit.  Download a "quit meter" on your computer to keep track of quit statistics, such as how long you have gone without smoking, cigarettes not smoked, and money saved.  Get a self-help book about quitting smoking and staying off tobacco. [place] yourself from urges to smoke. Talk to  someone, go for a walk, or occupy your time with a task.  Change your normal routine. Take a different route to work. Drink tea instead of coffee. Eat breakfast in a different place.  Reduce your stress. Take a hot bath, exercise, or read a book.  Plan something enjoyable to do every day. Reward yourself for not smoking.  Explore interactive web-based programs that specialize in helping you quit. GET MEDICINE AND USE IT CORRECTLY Medicines can help you stop smoking and decrease the urge to smoke. Combining medicine with the above behavioral methods and support can greatly increase your chances of successfully quitting smoking.  Nicotine replacement therapy helps deliver nicotine to your body without the negative effects and risks of smoking. Nicotine replacement therapy includes nicotine gum, lozenges, inhalers, nasal sprays, and skin patches. Some may be available over-the-counter and others require a prescription.  Antidepressant medicine helps people abstain from smoking, but how this works is unknown. This medicine is available by prescription.  Nicotinic receptor partial agonist medicine simulates the effect of nicotine in your brain. This medicine is available by prescription. Ask your health care provider for advice about which medicines to use and how to use them based on your health history. Your health care provider will tell you what side effects to look out for if you choose to be on a medicine or therapy. Carefully read the information on the package. Do not use any other product containing nicotine while using a nicotine replacement product.  RELAPSE OR DIFFICULT SITUATIONS Most relapses occur within the first 3 months after quitting. Do not be discouraged if you start smoking again. Remember, most people try several times before finally quitting. You may have symptoms of withdrawal because your body is used to nicotine. You may crave cigarettes, be irritable, feel very hungry, cough  often, get headaches, or have difficulty concentrating. The withdrawal symptoms are only temporary. They are strongest when you first quit, but they will go away within 10-14 days. To reduce the chances of relapse, try to:  Avoid drinking alcohol. Drinking lowers your chances of successfully quitting.  Reduce the amount of caffeine you consume. Once you quit smoking, the amount of caffeine in your body increases and can give you symptoms, such as a rapid heartbeat, sweating, and anxiety.  Avoid smokers because they can make you want to smoke.  Do not let weight gain distract you. Many smokers will gain weight when they quit, usually less than 10 pounds. Eat a healthy diet and stay active. You can always lose the weight gained after you quit.  Find ways to improve your mood other than smoking. FOR MORE INFORMATION  www.smokefree.gov  Document Released: 02/02/2001 Document Revised: 06/25/2013 Document Reviewed: 05/20/2011 [facility] Patient Information 2015 [place], [place]. This information is not intended to replace advice given to you by your health care provider. Make sure you discuss any questions you have with your health  care provider.  

## 2015-09-04 NOTE — Progress Notes (Signed)
Subjective:    Patient ID: [name], male    DOB: 1955/11/27, 60 y.o.   MRN: [id number]  Congestive Heart Failure Presents for initial visit. The disease course has been stable. Associated symptoms include fatigue, orthopnea and shortness of breath. Pertinent negatives include no abdominal pain, chest pain, chest pressure, edema, muscle weakness or palpitations. The symptoms have been stable. Past treatments include angiotensin receptor blockers and salt and fluid restriction. The treatment provided moderate relief. Compliance with prior treatments has been good. His past medical history is significant for HTN. There is no history of arrhythmia, CVA or DM.  Hypertension Associated symptoms include shortness of breath. Pertinent negatives include no chest pain, headaches or palpitations. There are no associated agents to hypertension. Risk factors for coronary artery disease include male gender and smoking/tobacco exposure. Past treatments include calcium channel blockers, angiotensin blockers and lifestyle changes. The current treatment provides mild improvement. Hypertensive end-organ damage includes kidney disease and heart failure. Identifiable causes of hypertension include chronic renal disease.    Past Medical History  Diagnosis Date  . Peripheral vascular disease ([place])   . ESRD (end stage renal disease) ([place])   . HTN (hypertension) 08/02/2015    Past Surgical History  Procedure Laterality Date  . Av fistula placement Left 2014  . Peripheral vascular catheterization N/A 10/03/2014    Procedure: A/V Shuntogram/Fistulagram;  Surgeon: Algernon Huxley, MD;  Location: [facility];  Service: Cardiovascular;  Laterality: N/A;  . Peripheral vascular catheterization Left 10/03/2014    Procedure: A/V Shunt Intervention;  Surgeon: Algernon Huxley, MD;  Location: [facility];  Service: Cardiovascular;  Laterality: Left;  . Peripheral vascular catheterization Left 05/01/2015     Procedure: A/V Shuntogram/Fistulagram;  Surgeon: Algernon Huxley, MD;  Location: [facility];  Service: Cardiovascular;  Laterality: Left;  . Peripheral vascular catheterization N/A 05/01/2015    Procedure: A/V Shunt Intervention;  Surgeon: Algernon Huxley, MD;  Location: [facility];  Service: Cardiovascular;  Laterality: N/A;    Family History  Problem Relation Age of Onset  . Hypertension Son   . Diabetes Son   . Hypertension Mother   . Diabetes Sister     Social History  Substance Use Topics  . Smoking status: Current Some Day Smoker -- 0.25 packs/day for 40 years    Types: Cigarettes    Last Attempt to Quit: 04/17/2014  . Smokeless tobacco: Never Used  . Alcohol Use: No    Allergies  Allergen Reactions  . Sulfa Antibiotics Rash    Prior to Admission medications   Medication Sig Start Date End Date Taking? Authorizing Provider  amLODipine (NORVASC) 10 MG tablet Take 10 mg by mouth daily.   Yes Historical Provider, MD  calcium acetate, Phos Binder, (PHOSLYRA) 667 MG/5ML SOLN Take 2,001 mg by mouth 3 (three) times daily with meals. Reported on 05/01/2015   Yes Historical Provider, MD  cinacalcet (SENSIPAR) 90 MG tablet Take 90 mg by mouth daily.   Yes Historical Provider, MD  hydrALAZINE (APRESOLINE) 25 MG tablet Take 25 mg by mouth 3 (three) times daily.   Yes Historical Provider, MD  irbesartan (AVAPRO) 300 MG tablet Take 1 tablet (300 mg total) by mouth every evening. 08/03/15  Yes Sital Mody, MD  nicotine (NICODERM CQ - DOSED IN MG/24 HOURS) 14 mg/24hr patch Place 1 patch (14 mg total) onto the skin daily. 08/03/15  Yes Bettey Costa, MD     Review of Systems  Constitutional: Positive for fatigue. Negative for appetite change.  HENT: Negative for congestion, rhinorrhea and sore throat.   Eyes: Negative.   Respiratory: Positive for shortness of breath. Negative for cough and chest tightness.   Cardiovascular: Negative for chest pain, palpitations and leg swelling.   Gastrointestinal: Negative for abdominal pain and abdominal distention.  Endocrine: Negative.   Genitourinary: Negative.   Musculoskeletal: Negative for back pain and muscle weakness.  Allergic/Immunologic: Negative.   Neurological: Negative for dizziness, light-headedness and headaches.  Hematological: Negative for adenopathy. Does not bruise/bleed easily.  Psychiatric/Behavioral: Negative for sleep disturbance (sleeping on 4 pillows) and dysphoric mood. The patient is not nervous/anxious.        Objective:   Physical Exam  Constitutional: He is oriented to person, place, and time. He appears well-developed and well-nourished.  HENT:  Head: Normocephalic and atraumatic.  Eyes: Conjunctivae are normal. Pupils are equal, round, and reactive to light.  Neck: Normal range of motion. Neck supple.  Cardiovascular: Normal rate and regular rhythm.   Pulmonary/Chest: Effort normal. He has no wheezes. He has no rales.  Abdominal: Soft. He exhibits no distension. There is no tenderness.  Musculoskeletal: He exhibits no edema or tenderness.  Neurological: He is alert and oriented to person, place, and time.  Skin: Skin is warm and dry.  Psychiatric: He has a normal mood and affect. His behavior is normal. Thought content normal.  Nursing note and vitals reviewed.  BP 159/94 mmHg  Pulse 76  Resp 18  Ht 5\' 9"  (1.753 m)  Wt 172 lb (78.019 kg)  BMI 25.39 kg/m2  SpO2 99%        Assessment & Plan:  1: Chronic heart failure with reduced ejection fraction- Patient presents with fatigue and shortness of breath upon exertion. He says that he was short of breath upon walking into the office today but once he sat down to rest, his breathing improved. He is not weighing himself daily and doesn't have any scales so a set of scales was given to him today. He was instructed to weigh daily every morning and to call for an overnight weight gain of >2 pounds or a weekly weight gain of >5 pounds.  Discussed that his weight may fluctuate depending on whether it's a dialysis day or not. He does add "a little" salt to his food and he was encouraged to not add any salt to his food. Discussed the importance of following a 2000mg  sodium diet and reviewed how to read food labels. Written dietary information was also given to him. [facility] went in and reviewed medications with the patient. 2: HTN- Blood pressure mildly elevated but he says that he hasn't taken his medications yet today. Discussed possibly adding a beta-blocker in the future but need to know what his blood pressure is like when he's taking his medications. 3: ESRD- Patient is currently doing dialysis M,W, F. Is in the process of getting worked up for a kidney transplant via [facility].  4: Tobacco use- Patient says that he's currently smoking about 3 cigarettes daily but has the nicotine patches that he's going to start wearing today. Understands the importance of stopping smoking completely especially in regards to transplant. Complete cessation discussed for 3 minutes with him.  Patient did not bring his medications nor a list. Each medication was verbally reviewed with the patient and he was encouraged to bring the bottles to every visit to confirm accuracy of list.  Return in 1 month or sooner for any  questions/problems before then.

## 2015-10-09 ENCOUNTER — Ambulatory Visit: Payer: Medicare Other | Attending: Family | Admitting: Family

## 2015-10-09 ENCOUNTER — Encounter: Payer: Self-pay | Admitting: Family

## 2015-10-09 VITALS — BP 165/96 | HR 80 | Resp 18 | Ht 69.0 in | Wt 173.0 lb

## 2015-10-09 DIAGNOSIS — I132 Hypertensive heart and chronic kidney disease with heart failure and with stage 5 chronic kidney disease, or end stage renal disease: Secondary | ICD-10-CM | POA: Diagnosis present

## 2015-10-09 DIAGNOSIS — Z833 Family history of diabetes mellitus: Secondary | ICD-10-CM | POA: Diagnosis not present

## 2015-10-09 DIAGNOSIS — F17210 Nicotine dependence, cigarettes, uncomplicated: Secondary | ICD-10-CM | POA: Diagnosis not present

## 2015-10-09 DIAGNOSIS — Z79899 Other long term (current) drug therapy: Secondary | ICD-10-CM | POA: Diagnosis not present

## 2015-10-09 DIAGNOSIS — I509 Heart failure, unspecified: Secondary | ICD-10-CM | POA: Insufficient documentation

## 2015-10-09 DIAGNOSIS — N186 End stage renal disease: Secondary | ICD-10-CM | POA: Diagnosis not present

## 2015-10-09 DIAGNOSIS — Z9889 Other specified postprocedural states: Secondary | ICD-10-CM | POA: Diagnosis not present

## 2015-10-09 DIAGNOSIS — I5022 Chronic systolic (congestive) heart failure: Secondary | ICD-10-CM

## 2015-10-09 DIAGNOSIS — I739 Peripheral vascular disease, unspecified: Secondary | ICD-10-CM | POA: Insufficient documentation

## 2015-10-09 DIAGNOSIS — Z8249 Family history of ischemic heart disease and other diseases of the circulatory system: Secondary | ICD-10-CM | POA: Diagnosis not present

## 2015-10-09 DIAGNOSIS — I10 Essential (primary) hypertension: Secondary | ICD-10-CM

## 2015-10-09 DIAGNOSIS — Z720 Tobacco use: Secondary | ICD-10-CM

## 2015-10-09 NOTE — Patient Instructions (Signed)
Resume weighing daily and call for an overnight weight gain of > 2 pounds or a weekly weight gain of >5 pounds.  Smoking Cessation Quitting smoking is important to your health and has many advantages. However, it is not always easy to quit since nicotine is a very addictive drug. Oftentimes, people try 3 times or more before being able to quit. This document explains the best ways for you to prepare to quit smoking. Quitting takes hard work and a lot of effort, but you can do it. ADVANTAGES OF QUITTING SMOKING  You will live longer, feel better, and live better.  Your body will feel the impact of quitting smoking almost immediately.  Within 20 minutes, blood pressure decreases. Your pulse returns to its normal level.  After 8 hours, carbon monoxide levels in the blood return to normal. Your oxygen level increases.  After 24 hours, the chance of having a heart attack starts to decrease. Your breath, hair, and body stop smelling like smoke.  After 48 hours, damaged nerve endings begin to recover. Your sense of taste and smell improve.  After 72 hours, the body is virtually free of nicotine. Your bronchial tubes relax and breathing becomes easier.  After 2 to 12 weeks, lungs can hold more air. Exercise becomes easier and circulation improves.  The risk of having a heart attack, stroke, cancer, or lung disease is greatly reduced.  After 1 year, the risk of coronary heart disease is cut in half.  After 5 years, the risk of stroke falls to the same as a nonsmoker.  After 10 years, the risk of lung cancer is cut in half and the risk of other cancers decreases significantly.  After 15 years, the risk of coronary heart disease drops, usually to the level of a nonsmoker.  If you are pregnant, quitting smoking will improve your chances of having a healthy baby.  The people you live with, especially any children, will be healthier.  You will have extra money to spend on things other than  cigarettes. QUESTIONS TO THINK ABOUT BEFORE ATTEMPTING TO QUIT You may want to talk about your answers with your health care provider.  Why do you want to quit?  If you tried to quit in the past, what helped and what did not?  What will be the most difficult situations for you after you quit? How will you plan to handle them?  Who can help you through the tough times? Your family? Friends? A health care provider?  What pleasures do you get from smoking? What ways can you still get pleasure if you quit? Here are some questions to ask your health care provider:  How can you help me to be successful at quitting?  What medicine do you think would be best for me and how should I take it?  What should I do if I need more help?  What is smoking withdrawal like? How can I get information on withdrawal? GET READY  Set a quit date.  Change your environment by getting rid of all cigarettes, ashtrays, matches, and lighters in your home, car, or work. Do not let people smoke in your home.  Review your past attempts to quit. Think about what worked and what did not. GET SUPPORT AND ENCOURAGEMENT You have a better chance of being successful if you have help. You can get support in many ways.  Tell your family, friends, and coworkers that you are going to quit and need their support. Ask them not   them not to smoke around you.  Get individual, group, or telephone counseling and support. Programs are available at local hospitals and health centers. Call your local health department for information about programs in your area.  Spiritual beliefs and practices may help some smokers quit.  Download a "quit meter" on your computer to keep track of quit statistics, such as how long you have gone without smoking, cigarettes not smoked, and money saved.  Get a self-help book about quitting smoking and staying off tobacco. LEARN NEW SKILLS AND BEHAVIORS  Distract yourself from urges to smoke. Talk to  someone, go for a walk, or occupy your time with a task.  Change your normal routine. Take a different route to work. Drink tea instead of coffee. Eat breakfast in a different place.  Reduce your stress. Take a hot bath, exercise, or read a book.  Plan something enjoyable to do every day. Reward yourself for not smoking.  Explore interactive web-based programs that specialize in helping you quit. GET MEDICINE AND USE IT CORRECTLY Medicines can help you stop smoking and decrease the urge to smoke. Combining medicine with the above behavioral methods and support can greatly increase your chances of successfully quitting smoking.  Nicotine replacement therapy helps deliver nicotine to your body without the negative effects and risks of smoking. Nicotine replacement therapy includes nicotine gum, lozenges, inhalers, nasal sprays, and skin patches. Some may be available over-the-counter and others require a prescription.  Antidepressant medicine helps people abstain from smoking, but how this works is unknown. This medicine is available by prescription.  Nicotinic receptor partial agonist medicine simulates the effect of nicotine in your brain. This medicine is available by prescription. Ask your health care provider for advice about which medicines to use and how to use them based on your health history. Your health care provider will tell you what side effects to look out for if you choose to be on a medicine or therapy. Carefully read the information on the package. Do not use any other product containing nicotine while using a nicotine replacement product.  RELAPSE OR DIFFICULT SITUATIONS Most relapses occur within the first 3 months after quitting. Do not be discouraged if you start smoking again. Remember, most people try several times before finally quitting. You may have symptoms of withdrawal because your body is used to nicotine. You may crave cigarettes, be irritable, feel very hungry, cough  often, get headaches, or have difficulty concentrating. The withdrawal symptoms are only temporary. They are strongest when you first quit, but they will go away within 10-14 days. To reduce the chances of relapse, try to:  Avoid drinking alcohol. Drinking lowers your chances of successfully quitting.  Reduce the amount of caffeine you consume. Once you quit smoking, the amount of caffeine in your body increases and can give you symptoms, such as a rapid heartbeat, sweating, and anxiety.  Avoid smokers because they can make you want to smoke.  Do not let weight gain distract you. Many smokers will gain weight when they quit, usually less than 10 pounds. Eat a healthy diet and stay active. You can always lose the weight gained after you quit.  Find ways to improve your mood other than smoking. FOR MORE INFORMATION  www.smokefree.gov  Document Released: 02/02/2001 Document Revised: 06/25/2013 Document Reviewed: 05/20/2011 Exit[employer] Patient Information 2015 [employer]. This information is not intended to replace advice given to you by your health care provider. Make sure you discuss any questions you have with your   provider.  

## 2015-10-09 NOTE — Progress Notes (Signed)
Subjective:    Patient ID: [name], male    DOB: 1955-12-29, 60 y.o.   MRN: [id number]  Congestive Heart Failure  Presents for follow-up visit. Associated symptoms include fatigue and shortness of breath. Pertinent negatives include no abdominal pain, chest pain, edema, orthopnea or palpitations. The symptoms have been stable.  Hypertension  This is a chronic problem. The current episode started more than 1 year ago. The problem has been waxing and waning since onset. Associated symptoms include shortness of breath. Pertinent negatives include no chest pain, neck pain, palpitations or peripheral edema. There are no associated agents to hypertension. Risk factors for coronary artery disease include male gender and smoking/tobacco exposure. Past treatments include calcium channel blockers, angiotensin blockers and lifestyle changes. The current treatment provides moderate improvement. Compliance problems include exercise.  Hypertensive end-organ damage includes kidney disease and heart failure.   Past Medical History:  Diagnosis Date  . ESRD (end stage renal disease) ([place])   . HTN (hypertension) 08/02/2015  . Peripheral vascular disease [facility])     Past Surgical History:  Procedure Laterality Date  . AV FISTULA PLACEMENT Left 2014  . PERIPHERAL VASCULAR CATHETERIZATION N/A 10/03/2014   Procedure: A/V Shuntogram/Fistulagram;  Surgeon: Algernon Huxley, MD;  Location: [place] [facility];  Service: Cardiovascular;  Laterality: N/A;  . PERIPHERAL VASCULAR CATHETERIZATION Left 10/03/2014   Procedure: A/V Shunt Intervention;  Surgeon: Algernon Huxley, MD;  Location: [place] [facility];  Service: Cardiovascular;  Laterality: Left;  . PERIPHERAL VASCULAR CATHETERIZATION Left 05/01/2015   Procedure: A/V Shuntogram/Fistulagram;  Surgeon: Algernon Huxley, MD;  Location: [place] [facility];  Service: Cardiovascular;  Laterality: Left;  . PERIPHERAL VASCULAR CATHETERIZATION N/A 05/01/2015   Procedure: A/V Shunt  Intervention;  Surgeon: Algernon Huxley, MD;  Location: [place] [facility];  Service: Cardiovascular;  Laterality: N/A;    Family History  Problem Relation Age of Onset  . Hypertension Son   . Diabetes Son   . Hypertension Mother   . Diabetes Sister     Social History  Substance Use Topics  . Smoking status: Current Some Day Smoker    Packs/day: 0.25    Years: 40.00    Types: Cigarettes  . Smokeless tobacco: Never Used  . Alcohol use No    Allergies  Allergen Reactions  . Sulfa Antibiotics Rash    Prior to Admission medications   Medication Sig Start Date End Date Taking? Authorizing Provider  amLODipine (NORVASC) 10 MG tablet Take 10 mg by mouth daily.   Yes Historical Provider, MD  calcium acetate, Phos Binder, (PHOSLYRA) 667 MG/5ML SOLN Take 2,001 mg by mouth 3 (three) times daily with meals. Reported on 05/01/2015   Yes Historical Provider, MD  Calcium Carbonate Antacid (TUMS ULTRA 1000 PO) Take 2 tablets by mouth 3 (three) times daily before meals.   Yes Historical Provider, MD  cinacalcet (SENSIPAR) 90 MG tablet Take 90 mg by mouth daily.   Yes Historical Provider, MD  hydrALAZINE (APRESOLINE) 25 MG tablet Take 25 mg by mouth 3 (three) times daily.   Yes Historical Provider, MD  irbesartan (AVAPRO) 300 MG tablet Take 1 tablet (300 mg total) by mouth every evening. Patient taking differently: Take 450 mg by mouth every evening.  08/03/15  Yes Sital Mody, MD  loratadine (CLARITIN) 10 MG tablet Take 10 mg by mouth daily as needed for allergies.   Yes Historical Provider, MD      Review of Systems  Constitutional: Positive for  fatigue. Negative for appetite change.  HENT: Negative for congestion, postnasal drip and sore throat.   Eyes: Positive for visual disturbance (blurry vision). Negative for pain.  Respiratory: Positive for cough and shortness of breath. Negative for chest tightness.   Cardiovascular: Negative for chest pain, palpitations and leg swelling.   Gastrointestinal: Negative for abdominal distention and abdominal pain.  Endocrine: Negative.   Genitourinary: Negative.   Musculoskeletal: Negative for back pain and neck pain.  Skin: Negative.   Allergic/Immunologic: Negative.   Neurological: Negative for dizziness and light-headedness.  Hematological: Negative for adenopathy. Does not bruise/bleed easily.  Psychiatric/Behavioral: Negative for dysphoric mood and sleep disturbance. The patient is not nervous/anxious.        Objective:   Physical Exam  Constitutional: He is oriented to person, place, and time. He appears well-developed and well-nourished.  HENT:  Head: Normocephalic and atraumatic.  Eyes: Conjunctivae are normal. Pupils are equal, round, and reactive to light.  Neck: Normal range of motion. Neck supple.  Cardiovascular: Normal rate and regular rhythm.   Pulmonary/Chest: Effort normal. He has no wheezes. He has no rales.  Abdominal: Soft. He exhibits no distension. There is no tenderness.  Musculoskeletal: He exhibits no edema or tenderness.  Neurological: He is alert and oriented to person, place, and time.  Skin: Skin is warm and dry.  Psychiatric: He has a normal mood and affect. His behavior is normal. Thought content normal.  Nursing note and vitals reviewed.   BP (!) 165/96   Pulse 80   Resp 18   Ht 5\' 9"  (1.753 m)   Wt 173 lb (78.5 kg)   SpO2 100%   BMI 25.55 kg/m        Assessment & Plan:  1: Chronic heart failure with reduced ejection fraction- Patient presents with fatigue and shortness of breath with moderate exertion (Class II) which quickly improves upon rest. He denies any swelling in his legs or abdomen. Says that he hasn't been weighing himself daily although says that he gets weighed at dialysis. Encouraged him to resume weighing himself daily at home so that he can call for an overnight weight gain of >2 pounds or a weekly weight gain of >5 pounds. He is not adding any salt to his food and  is trying to eat low sodium foods.  2: HTN- Blood pressure mildly elevated today and he says that he does keep an eye on it at home. Instructed him to call his PCP should his blood pressure remain elevated. 3: ESRD- Patient continues to receive dialysis on M, W, F. Has an appointment at [facility] regarding the kidney transplant list. 4: Tobacco use- He says that he's smoking about 2 cigarettes at the most daily. Complete cessation was discussed for 3 minutes with him.  Medication bottles were reviewed with the patient.  Return in 3 months or sooner for any questions/problems before the next office visit.

## 2015-11-12 ENCOUNTER — Encounter (INDEPENDENT_AMBULATORY_CARE_PROVIDER_SITE_OTHER): Payer: Self-pay

## 2015-11-23 ENCOUNTER — Encounter: Payer: Self-pay | Admitting: Emergency Medicine

## 2015-11-23 ENCOUNTER — Emergency Department: Payer: Medicare Other

## 2015-11-23 ENCOUNTER — Emergency Department
Admission: EM | Admit: 2015-11-23 | Discharge: 2015-11-23 | Disposition: A | Discharge status: Other Acute Inpatient Hospital | Payer: Medicare Other | Attending: Emergency Medicine | Admitting: Emergency Medicine

## 2015-11-23 DIAGNOSIS — R0902 Hypoxemia: Secondary | ICD-10-CM

## 2015-11-23 DIAGNOSIS — N186 End stage renal disease: Secondary | ICD-10-CM | POA: Diagnosis not present

## 2015-11-23 DIAGNOSIS — I10 Essential (primary) hypertension: Secondary | ICD-10-CM

## 2015-11-23 DIAGNOSIS — I213 ST elevation (STEMI) myocardial infarction of unspecified site: Secondary | ICD-10-CM | POA: Diagnosis not present

## 2015-11-23 DIAGNOSIS — R0602 Shortness of breath: Secondary | ICD-10-CM | POA: Diagnosis present

## 2015-11-23 DIAGNOSIS — I132 Hypertensive heart and chronic kidney disease with heart failure and with stage 5 chronic kidney disease, or end stage renal disease: Secondary | ICD-10-CM | POA: Insufficient documentation

## 2015-11-23 DIAGNOSIS — F17210 Nicotine dependence, cigarettes, uncomplicated: Secondary | ICD-10-CM | POA: Insufficient documentation

## 2015-11-23 DIAGNOSIS — Z79899 Other long term (current) drug therapy: Secondary | ICD-10-CM | POA: Diagnosis not present

## 2015-11-23 DIAGNOSIS — R042 Hemoptysis: Secondary | ICD-10-CM | POA: Insufficient documentation

## 2015-11-23 DIAGNOSIS — Z992 Dependence on renal dialysis: Secondary | ICD-10-CM | POA: Insufficient documentation

## 2015-11-23 DIAGNOSIS — I509 Heart failure, unspecified: Secondary | ICD-10-CM | POA: Insufficient documentation

## 2015-11-23 LAB — BASIC METABOLIC PANEL
ANION GAP: 16 — AB (ref 5–15)
BUN: 71 mg/dL — ABNORMAL HIGH (ref 6–20)
CHLORIDE: 103 mmol/L (ref 101–111)
CO2: 20 mmol/L — AB (ref 22–32)
Calcium: 7.7 mg/dL — ABNORMAL LOW (ref 8.9–10.3)
Creatinine, Ser: 10.9 mg/dL — ABNORMAL HIGH (ref 0.61–1.24)
GFR calc non Af Amer: 4 mL/min — ABNORMAL LOW (ref >60)
GFR, EST AFRICAN AMERICAN: 5 mL/min — AB (ref >60)
Glucose, Bld: 115 mg/dL — ABNORMAL HIGH (ref 65–99)
POTASSIUM: 5.5 mmol/L — AB (ref 3.5–5.1)
Sodium: 139 mmol/L (ref 135–145)

## 2015-11-23 LAB — CBC
HEMATOCRIT: 36.8 % — AB (ref 40.0–52.0)
Hemoglobin: 12 g/dL — ABNORMAL LOW (ref 13.0–18.0)
MCH: 30.3 pg (ref 26.0–34.0)
MCHC: 32.6 g/dL (ref 32.0–36.0)
MCV: 92.8 fL (ref 80.0–100.0)
Platelets: 218 10*3/uL (ref 150–440)
RBC: 3.96 MIL/uL — AB (ref 4.40–5.90)
RDW: 15.5 % — ABNORMAL HIGH (ref 11.5–14.5)
WBC: 7.4 10*3/uL (ref 3.8–10.6)

## 2015-11-23 LAB — TYPE AND SCREEN
ABO/RH(D): O POS
ANTIBODY SCREEN: NEGATIVE

## 2015-11-23 MED ORDER — NITROGLYCERIN 0.4 MG SL SUBL
0.4000 mg | SUBLINGUAL_TABLET | SUBLINGUAL | Status: DC | PRN
  Administered 2015-11-23: 0.4 mg via SUBLINGUAL

## 2015-11-23 MED ORDER — NITROGLYCERIN 2 % TD OINT
1.0000 [in_us] | TOPICAL_OINTMENT | Freq: Once | TRANSDERMAL | Status: AC
Start: 2015-11-23 — End: 2015-11-23
  Administered 2015-11-23: 1 [in_us] via TOPICAL

## 2015-11-23 MED ORDER — ASPIRIN 81 MG PO CHEW
324.0000 mg | CHEWABLE_TABLET | Freq: Once | ORAL | Status: AC
  Administered 2015-11-23: 324 mg via ORAL

## 2015-11-23 NOTE — ED Notes (Signed)
Pt given bag mask O2 due to sat of 84%.

## 2015-11-23 NOTE — ED Provider Notes (Signed)
[facility] Emergency Department Provider Note  ____________________________________________  Time seen: Approximately 9:48 PM  I have reviewed the triage vital signs and the nursing notes.   HISTORY  Chief Complaint Shortness of Breath    HPI [name] is a 60 y.o. male with ESRD on HD, ongoing tobacco abuse, HTN, CHF, presenting with 2 days of chest pain and shortness of breath. The patient reports that he weighs in [place] when he developed progressively worsening shortness of breath as well as a left-sided severe chest pain that did not go to his back, or radiate anywhere else. On arrival to the emergency department, the patient had an EKG that is consistent with STEMI, hypoxia to 82% on room air, hypertension, and ongoing chest pain, shortness of breath and diaphoresis.  Last hemodialysis on Friday, does not take any medications for erectile dysfunction. Denies any recent cocaine abuse.   Past Medical History:  Diagnosis Date  . ESRD (end stage renal disease) ([place])   . HTN (hypertension) 08/02/2015  . Peripheral vascular disease [facility])     Patient Active Problem List   Diagnosis Date Noted  . Tobacco use 09/04/2015  . ESRD (end stage renal disease) ([place]) 08/02/2015  . Fluid overload 08/02/2015  . HTN (hypertension) 08/02/2015  . CHF (congestive heart failure) ([place]) 11/10/2014    Past Surgical History:  Procedure Laterality Date  . AV FISTULA PLACEMENT Left 2014  . PERIPHERAL VASCULAR CATHETERIZATION N/A 10/03/2014   Procedure: A/V Shuntogram/Fistulagram;  Surgeon: Algernon Huxley, MD;  Location: [place] [facility];  Service: Cardiovascular;  Laterality: N/A;  . PERIPHERAL VASCULAR CATHETERIZATION Left 10/03/2014   Procedure: A/V Shunt Intervention;  Surgeon: Algernon Huxley, MD;  Location: [place] [facility];  Service: Cardiovascular;  Laterality: Left;  . PERIPHERAL VASCULAR CATHETERIZATION Left 05/01/2015   Procedure: A/V  Shuntogram/Fistulagram;  Surgeon: Algernon Huxley, MD;  Location: [place] [facility];  Service: Cardiovascular;  Laterality: Left;  . PERIPHERAL VASCULAR CATHETERIZATION N/A 05/01/2015   Procedure: A/V Shunt Intervention;  Surgeon: Algernon Huxley, MD;  Location: [place] [facility];  Service: Cardiovascular;  Laterality: N/A;    Current Outpatient Rx  . Order #: [id number] Class: Historical Med  . Order #: [id number] Class: Historical Med  . Order #: [id number] Class: Historical Med  . Order #: [id number] Class: Historical Med  . Order #: [id number] Class: Historical Med  . Order #: [id number] Class: Normal  . Order #: [id number] Class: Historical Med    Allergies Sulfa antibiotics  Family History  Problem Relation Age of Onset  . Hypertension Son   . Diabetes Son   . Hypertension Mother   . Diabetes Sister     Social History Social History  Substance Use Topics  . Smoking status: Current Some Day Smoker    Packs/day: 0.25    Years: 40.00    Types: Cigarettes  . Smokeless tobacco: Never Used  . Alcohol use No    Review of Systems Constitutional: No fever/chills.No lightheadedness or syncope. Positive diaphoresis. Eyes: No visual changes. ENT: No sore throat. No congestion or rhinorrhea. Cardiovascular: Positive chest pain. Denies palpitations. Respiratory: Positive shortness of breath.  No cough with hemoptysis. Gastrointestinal: No abdominal pain.  No nausea, no vomiting.  No diarrhea.  No constipation. Genitourinary: Negative for dysuria. Musculoskeletal: Negative for back pain. Skin: Negative for rash. Neurological: Negative for headaches. No focal numbness, tingling or weakness.   10-point ROS otherwise negative.  ____________________________________________   PHYSICAL EXAM:  VITAL SIGNS: ED Triage Vitals  Enc  Vitals Group     BP --      Pulse Rate 11/23/15 2144 97     Resp 11/23/15 2144 (!) 38     Temp --      Temp src --      SpO2 --      Weight 11/23/15 2146 170 lb  (77.1 kg)     Height 11/23/15 2146 5\' 9"  (1.753 m)     Head Circumference --      Peak Flow --      Pain Score 11/23/15 2147 4     Pain Loc --      Pain Edu? --      Excl. in [place]? --     Constitutional: The patient is alert and mentating well but diaphoretic and in extremis. Eyes: Conjunctivae are normal.  EOMI. No scleral icterus. Head: Atraumatic. Nose: No congestion/rhinnorhea. Mouth/Throat: Mucous membranes are moist.  Neck: No stridor.  Supple.  Positive JVD. Cardiovascular: Normal rate, regular rhythm. No murmurs, rubs or gallops.  Respiratory: Positive tachypnea with accessory muscle use and mild retractions. Diffuse wheezing and rales in the bilateral lung bases. Hypoxia to 82% on room air. Active hemoptysis. Gastrointestinal: Soft, nontender and nondistended.  No guarding or rebound.  No peritoneal signs. Musculoskeletal: Positive bilateral pitting lower extremity edema that is symmetric to the mid tibial shaft. No tenderness to palpation in the calves, palpable cords or Homans sign. Positive fistula in the left arm with normal thrill. Neurologic:  A&Ox3.  Speech is clear.  Face and smile are symmetric.  EOMI.  Moves all extremities well. Skin:  Skin is warm, dry and intact. No rash noted.   ____________________________________________   LABS (all labs ordered are listed, but only abnormal results are displayed)  Labs Reviewed  CBC  BASIC METABOLIC PANEL  APTT  PROTIME-INR  TYPE AND SCREEN   ____________________________________________  EKG  ED ECG REPORT I, Eula Listen, the attending physician, personally viewed and interpreted this ECG.   Date: 11/23/2015  EKG Time: 2139  Rate: 95  Rhythm: Normal sinus rhythm.  Axis: Leftward  Intervals:Positive prolonged QTC.  ST&T Change: ST elevation or 0.5 mm in V1 and V2. 2.5 mm ST elevation in V3 with 2 mm ST elevation in V4 and T-wave inversion in V5 6. Peaked T waves in V2, V3 and V4. 1 mm ST elevation in  aVR. This EKG is consistent with STEMI.  ____________________________________________  RADIOLOGY  No results found.  ____________________________________________   PROCEDURES  Procedure(s) performed: None  Procedures  Critical Care performed: Yes, see critical care note(s) ____________________________________________   INITIAL IMPRESSION / ASSESSMENT AND PLAN / ED COURSE  Pertinent labs & imaging results that were available during my care of the patient were reviewed by me and considered in my medical decision making (see chart for details).  60 y.o. male with ESRD on HD, HTN, CHF presenting with STEMI, hemoptysis with clear clinical evidence of fluid overload and pulmonary edema, hypoxic to the 80s.  Immediately, the patient was placed on 4 L nasal cannula and did not improve his oxygen saturation. On facemask, the patient oxygen was 96%.  STEMI alert was called to [facility] immediately, unfortunate, the interventional cardiologist was just about to start a case and would not be available for at least 90 minutes. I immediately contacted [facility] for transfer. They accepted the patient, but we had some difficulty with transportation. They did not have any ground vehicles available, and their closest helicopter option was more  than 30 minutes away. In the emergency department already at the bedside, I have an [place] truck, who cannot take drips, but could take the patient. The patient was ineligible for heparin bolus or drip given his hemoptysis. His chest pain was improving from 3 out of 10 to lower than that with subungual neck glycerin. This was also helping his hypertension. An inch of paste was placed on his chest. At that time, a decision had to be made about whether to transport the patient with low-level chest pain immediately, or significantly delay his cardiac catheterization to wait for an option where a nitroglycerin glycerin drip could be transported with him. I discussed the risk  and benefit with the patient and his son, and we all agreed that his best chance for decreased morbidity and mortality was cardiac catheterization, and that we would send him on the ALS truck since it was immediately available. He understood and his son understood the risk of deterioration, significant cardiac muscle dysfunction, MVA, and death. He left the emergency department in improved but unstable condition.  CRITICAL CARE Performed by: Eula Listen   Total critical care time: 31 minutes  Critical care time was exclusive of separately billable procedures and treating other patients.  Critical care was necessary to treat or prevent imminent or life-threatening deterioration.  Critical care was time spent personally by me on the following activities: development of treatment plan with patient and/or surrogate as well as nursing, discussions with consultants, evaluation of patient's response to treatment, examination of patient, obtaining history from patient or surrogate, ordering and performing treatments and interventions, ordering and review of laboratory studies, ordering and review of radiographic studies, pulse oximetry and re-evaluation of patient's condition.   ____________________________________________  FINAL CLINICAL IMPRESSION(S) / ED DIAGNOSES  Final diagnoses:  None    Clinical Course      NEW MEDICATIONS STARTED DURING THIS VISIT:  New Prescriptions   No medications on file      Eula Listen, MD 11/23/15 2234

## 2015-11-23 NOTE — ED Triage Notes (Signed)
Pt states that he was at [facility] and started having SOB yesterday. Pt states that the SOB became increasingly worse today and that they decided to leave the [facility] to come back. Pt also states that he is coughing up blood. Pt is alert and oriented.

## 2015-12-15 ENCOUNTER — Other Ambulatory Visit (INDEPENDENT_AMBULATORY_CARE_PROVIDER_SITE_OTHER): Payer: Self-pay | Admitting: Vascular Surgery

## 2015-12-15 DIAGNOSIS — N186 End stage renal disease: Secondary | ICD-10-CM

## 2015-12-15 DIAGNOSIS — T829XXA Unspecified complication of cardiac and vascular prosthetic device, implant and graft, initial encounter: Secondary | ICD-10-CM

## 2015-12-15 DIAGNOSIS — Z992 Dependence on renal dialysis: Secondary | ICD-10-CM

## 2015-12-16 ENCOUNTER — Encounter (INDEPENDENT_AMBULATORY_CARE_PROVIDER_SITE_OTHER): Payer: Self-pay | Admitting: Vascular Surgery

## 2015-12-16 ENCOUNTER — Encounter (INDEPENDENT_AMBULATORY_CARE_PROVIDER_SITE_OTHER): Payer: Self-pay

## 2015-12-16 ENCOUNTER — Ambulatory Visit (INDEPENDENT_AMBULATORY_CARE_PROVIDER_SITE_OTHER): Payer: Medicare Other

## 2015-12-16 ENCOUNTER — Ambulatory Visit (INDEPENDENT_AMBULATORY_CARE_PROVIDER_SITE_OTHER): Payer: Medicare Other | Admitting: Vascular Surgery

## 2015-12-16 VITALS — BP 154/90 | HR 76 | Resp 16 | Ht 69.0 in | Wt 173.0 lb

## 2015-12-16 DIAGNOSIS — Z720 Tobacco use: Secondary | ICD-10-CM | POA: Diagnosis not present

## 2015-12-16 DIAGNOSIS — T829XXA Unspecified complication of cardiac and vascular prosthetic device, implant and graft, initial encounter: Secondary | ICD-10-CM

## 2015-12-16 DIAGNOSIS — Z992 Dependence on renal dialysis: Secondary | ICD-10-CM

## 2015-12-16 DIAGNOSIS — T829XXS Unspecified complication of cardiac and vascular prosthetic device, implant and graft, sequela: Secondary | ICD-10-CM | POA: Diagnosis not present

## 2015-12-16 DIAGNOSIS — N186 End stage renal disease: Secondary | ICD-10-CM

## 2015-12-16 NOTE — Progress Notes (Signed)
Subjective:    Patient ID: [name], male    DOB: August 11, 1955, 60 y.o.   MRN: [id number] Chief Complaint  Patient presents with  . Follow-up   Patient presents for a three hemodialysis access follow up. The patient has a left radiocephalic fistula created on 01-28-12. The patient underwent a duplex ultrasound of the AV access which was notable for a patent fistula without any significant hemodynamic stenosis with exception of some narrowing at the anastamosis. The patient denies any issues with hemodialysis such as cannulation problems, increased bleeding, decrease in doppler flow or recirculation. 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    Review of Systems  Constitutional: Negative.   HENT: Negative.   Eyes: Negative.   Respiratory: Negative.   Cardiovascular: Negative.   Gastrointestinal: Negative.   Endocrine: Negative.   Genitourinary:       ESRD  Musculoskeletal: Negative.   Skin: Negative.   Allergic/Immunologic: Negative.   Neurological: Negative.   Hematological: Negative.   Psychiatric/Behavioral: Negative.       Objective:   Physical Exam  Constitutional: He is oriented to person, place, and time. He appears well-developed and well-nourished.  HENT:  Head: Normocephalic and atraumatic.  Eyes: Conjunctivae and EOM are normal. Pupils are equal, round, and reactive to light.  Neck: Normal range of motion.  Cardiovascular: Normal rate, regular rhythm, normal heart sounds and intact distal pulses.   Pulses:      Radial pulses are 2+ on the right side, and 1+ on the left side.       Dorsalis pedis pulses are 1+ on the right side, and 1+ on the left side.       Posterior tibial pulses are 1+ on the right side, and 1+ on the left side.  Radiocephalic AV Fistula: Growth in aneursymal sights, now with notable skin threatening. Skin intact at  this time.  Pulmonary/Chest: Effort normal and breath sounds normal.  Abdominal: Soft. Bowel sounds are normal.  Musculoskeletal: Normal range of motion.  Neurological: He is alert and oriented to person, place, and time.  Skin: Skin is warm and dry.  Psychiatric: He has a normal mood and affect. His behavior is normal. Judgment and thought content normal.   BP (!) 154/90   Pulse 76   Resp 16   Ht 5\' 9"  (1.753 m)   Wt 173 lb (78.5 kg)   BMI 25.55 kg/m   Past Medical History:  Diagnosis Date  . ESRD (end stage renal disease) ([place])   . HTN (hypertension) 08/02/2015  . Peripheral vascular disease [facility])    Social History   Social History  . Marital status: Single    Spouse name: N/A  . Number of children: N/A  . Years of education: N/A   Occupational History  . Not on file.   Social History Main Topics  . Smoking status: Current Some Day Smoker    Packs/day: 0.25    Years: 40.00    Types: Cigarettes  . Smokeless tobacco: Never Used  . Alcohol use No  . Drug use: No  . Sexual activity: Not on file   Other Topics Concern  . Not on file   Social History Narrative   Lives at home by himself.   Independent on ambulation   Past Surgical History:  Procedure Laterality Date  . AV FISTULA PLACEMENT Left 2014  .  PERIPHERAL VASCULAR CATHETERIZATION N/A 10/03/2014   Procedure: A/V Shuntogram/Fistulagram;  Surgeon: Algernon Huxley, MD;  Location: [place] [facility];  Service: Cardiovascular;  Laterality: N/A;  . PERIPHERAL VASCULAR CATHETERIZATION Left 10/03/2014   Procedure: A/V Shunt Intervention;  Surgeon: Algernon Huxley, MD;  Location: [place] [facility];  Service: Cardiovascular;  Laterality: Left;  . PERIPHERAL VASCULAR CATHETERIZATION Left 05/01/2015   Procedure: A/V Shuntogram/Fistulagram;  Surgeon: Algernon Huxley, MD;  Location: [place] [facility];  Service: Cardiovascular;  Laterality: Left;  . PERIPHERAL VASCULAR CATHETERIZATION N/A 05/01/2015   Procedure: A/V Shunt  Intervention;  Surgeon: Algernon Huxley, MD;  Location: [place] [facility];  Service: Cardiovascular;  Laterality: N/A;   Family History  Problem Relation Age of Onset  . Hypertension Son   . Diabetes Son   . Hypertension Mother   . Diabetes Sister    Allergies  Allergen Reactions  . Sulfa Antibiotics Rash      Assessment & Plan:  Patient presents for a three hemodialysis access follow up. The patient has a left radiocephalic fistula created on 01-28-12. The patient underwent a duplex ultrasound of the AV access which was notable for a patent fistula without any significant hemodynamic stenosis with exception of some narrowing at the anastamosis. The patient denies any issues with hemodialysis such as cannulation problems, increased bleeding, decrease in doppler flow or recirculation. 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   1. Complication from renal dialysis device, sequela - Worsening Patient now with skin threatening along fistula aneurysmal sites. Patient is at high risk for a bleeding episode due to the size and now skin threatening status of his fistula. Recommend a left upper extremity fistulogram to assess anatomy in preparation for revision surgery. Procedure, risks and benefits explained to patient. All questions answered. Patient wishes to proceed.   2. ESRD (end stage renal disease) ([place]) - Stable Can continue dialysis through fistula for now in preparation for fistulogram and surgical revision in the future.   3. Tobacco use - Stable I have discussed with the patient the role of tobacco in the pathogenesis of atherosclerosis and its effect on the progression of the disease, impact on the durability of interventions and its limitations on the formation of collateral pathways. I have recommended absolute tobacco cessation. I have discussed various  options available for assistance with tobacco cessation including over the counter methods (Nicotine gum, patch and lozenges). We also discussed prescription options (Chantix, Nicotine Inhaler / Nasal Spray). The patient is not interested in pursuing any prescription tobacco cessation options at this time. The patient voices their understanding.   Current Outpatient Prescriptions on File Prior to Visit  Medication Sig Dispense Refill  . amLODipine (NORVASC) 10 MG tablet Take 10 mg by mouth daily.    . calcium acetate, Phos Binder, (PHOSLYRA) 667 MG/5ML SOLN Take 2,001 mg by mouth 3 (three) times daily with meals. Reported on 05/01/2015    . Calcium Carbonate Antacid (TUMS ULTRA 1000 PO) Take 2 tablets by mouth 3 (three) times daily before meals.    . cinacalcet (SENSIPAR) 90 MG tablet Take 90 mg by mouth daily.    . hydrALAZINE (APRESOLINE) 25 MG tablet Take 25 mg by mouth 3 (three) times daily.    . irbesartan (AVAPRO) 300 MG tablet Take 1 tablet (300 mg total) by mouth every evening. (Patient taking differently:  Take 450 mg by mouth every evening. ) 30 tablet 0  . loratadine (CLARITIN) 10 MG tablet Take 10 mg by mouth daily as needed for allergies.     No current facility-administered medications on file prior to visit.     There are no Patient Instructions on file for this visit. No Follow-up on file.   Dannel Rafter A Jerimy Johanson, PA-C

## 2015-12-22 ENCOUNTER — Other Ambulatory Visit (INDEPENDENT_AMBULATORY_CARE_PROVIDER_SITE_OTHER): Payer: Self-pay | Admitting: Vascular Surgery

## 2015-12-24 MED ORDER — DEXTROSE 5 % IV SOLN: 1.5000 g | INTRAVENOUS | Status: DC

## 2015-12-25 ENCOUNTER — Encounter: Admission: RE | Payer: Self-pay | Source: Ambulatory Visit

## 2015-12-25 ENCOUNTER — Ambulatory Visit: Admission: RE | Admit: 2015-12-25 | Payer: Medicare Other | Source: Ambulatory Visit | Admitting: Vascular Surgery

## 2015-12-25 SURGERY — A/V SHUNTOGRAM/FISTULAGRAM
Anesthesia: Moderate Sedation

## 2016-01-06 ENCOUNTER — Encounter: Payer: Self-pay | Admitting: Emergency Medicine

## 2016-01-06 ENCOUNTER — Observation Stay
Admission: EM | Admit: 2016-01-06 | Discharge: 2016-01-07 | Disposition: A | Discharge status: Home / Self Care | Payer: Medicare Other | Attending: Internal Medicine | Admitting: Internal Medicine

## 2016-01-06 ENCOUNTER — Emergency Department: Payer: Medicare Other

## 2016-01-06 DIAGNOSIS — I4581 Long QT syndrome: Secondary | ICD-10-CM | POA: Diagnosis not present

## 2016-01-06 DIAGNOSIS — Z833 Family history of diabetes mellitus: Secondary | ICD-10-CM | POA: Diagnosis not present

## 2016-01-06 DIAGNOSIS — N186 End stage renal disease: Secondary | ICD-10-CM | POA: Insufficient documentation

## 2016-01-06 DIAGNOSIS — R0600 Dyspnea, unspecified: Secondary | ICD-10-CM

## 2016-01-06 DIAGNOSIS — J9601 Acute respiratory failure with hypoxia: Principal | ICD-10-CM | POA: Insufficient documentation

## 2016-01-06 DIAGNOSIS — J810 Acute pulmonary edema: Secondary | ICD-10-CM

## 2016-01-06 DIAGNOSIS — I739 Peripheral vascular disease, unspecified: Secondary | ICD-10-CM | POA: Diagnosis not present

## 2016-01-06 DIAGNOSIS — Z79899 Other long term (current) drug therapy: Secondary | ICD-10-CM | POA: Insufficient documentation

## 2016-01-06 DIAGNOSIS — Z992 Dependence on renal dialysis: Secondary | ICD-10-CM | POA: Insufficient documentation

## 2016-01-06 DIAGNOSIS — J189 Pneumonia, unspecified organism: Secondary | ICD-10-CM | POA: Diagnosis present

## 2016-01-06 DIAGNOSIS — Z882 Allergy status to sulfonamides status: Secondary | ICD-10-CM | POA: Insufficient documentation

## 2016-01-06 DIAGNOSIS — I509 Heart failure, unspecified: Secondary | ICD-10-CM | POA: Diagnosis not present

## 2016-01-06 DIAGNOSIS — Z9889 Other specified postprocedural states: Secondary | ICD-10-CM | POA: Insufficient documentation

## 2016-01-06 DIAGNOSIS — Z8249 Family history of ischemic heart disease and other diseases of the circulatory system: Secondary | ICD-10-CM | POA: Diagnosis not present

## 2016-01-06 DIAGNOSIS — B1920 Unspecified viral hepatitis C without hepatic coma: Secondary | ICD-10-CM | POA: Diagnosis not present

## 2016-01-06 DIAGNOSIS — D631 Anemia in chronic kidney disease: Secondary | ICD-10-CM | POA: Diagnosis not present

## 2016-01-06 DIAGNOSIS — E875 Hyperkalemia: Secondary | ICD-10-CM | POA: Diagnosis not present

## 2016-01-06 DIAGNOSIS — I700 Atherosclerosis of aorta: Secondary | ICD-10-CM | POA: Diagnosis not present

## 2016-01-06 DIAGNOSIS — F17210 Nicotine dependence, cigarettes, uncomplicated: Secondary | ICD-10-CM | POA: Diagnosis not present

## 2016-01-06 DIAGNOSIS — I132 Hypertensive heart and chronic kidney disease with heart failure and with stage 5 chronic kidney disease, or end stage renal disease: Secondary | ICD-10-CM | POA: Insufficient documentation

## 2016-01-06 DIAGNOSIS — N2581 Secondary hyperparathyroidism of renal origin: Secondary | ICD-10-CM | POA: Diagnosis not present

## 2016-01-06 LAB — CBC
HEMATOCRIT: 29.1 % — AB (ref 40.0–52.0)
HEMOGLOBIN: 9.6 g/dL — AB (ref 13.0–18.0)
MCH: 31.5 pg (ref 26.0–34.0)
MCHC: 33.1 g/dL (ref 32.0–36.0)
MCV: 95.1 fL (ref 80.0–100.0)
PLATELETS: 212 10*3/uL (ref 150–440)
RBC: 3.06 MIL/uL — AB (ref 4.40–5.90)
RDW: 17.2 % — ABNORMAL HIGH (ref 11.5–14.5)
WBC: 6.6 10*3/uL (ref 3.8–10.6)

## 2016-01-06 LAB — COMPREHENSIVE METABOLIC PANEL
ALK PHOS: 64 U/L (ref 38–126)
ALT: 25 U/L (ref 17–63)
AST: 61 U/L — AB (ref 15–41)
Albumin: 3.7 g/dL (ref 3.5–5.0)
Anion gap: 14 (ref 5–15)
BILIRUBIN TOTAL: 0.6 mg/dL (ref 0.3–1.2)
BUN: 52 mg/dL — AB (ref 6–20)
CALCIUM: 8 mg/dL — AB (ref 8.9–10.3)
CO2: 23 mmol/L (ref 22–32)
CREATININE: 9.02 mg/dL — AB (ref 0.61–1.24)
Chloride: 103 mmol/L (ref 101–111)
GFR, EST AFRICAN AMERICAN: 6 mL/min — AB (ref >60)
GFR, EST NON AFRICAN AMERICAN: 6 mL/min — AB (ref >60)
Glucose, Bld: 78 mg/dL (ref 65–99)
Potassium: 5.7 mmol/L — ABNORMAL HIGH (ref 3.5–5.1)
Sodium: 140 mmol/L (ref 135–145)
TOTAL PROTEIN: 7.8 g/dL (ref 6.5–8.1)

## 2016-01-06 LAB — TROPONIN I: TROPONIN I: 0.13 ng/mL — AB (ref <0.03)

## 2016-01-06 MED ORDER — LEVOFLOXACIN IN D5W 750 MG/150ML IV SOLN
750.0000 mg | Freq: Once | INTRAVENOUS | Status: AC
  Administered 2016-01-06: 750 mg via INTRAVENOUS
  Filled 2016-01-06: qty 150

## 2016-01-06 NOTE — ED Triage Notes (Signed)
Pt to ED via EMS from home with c/o SOB and CP that started this am. Pt is dialysis pt on M,W,F, last dialysis was yesterday. Pt has prod cough. Pt A&Ox4.

## 2016-01-06 NOTE — ED Notes (Signed)
Cresencia Asmus RN helped the Pt to the bedside commode.

## 2016-01-06 NOTE — ED Provider Notes (Signed)
[facility] Emergency Department Provider Note  Time seen: 8:33 PM  I have reviewed the triage vital signs and the nursing notes.   HISTORY  Chief Complaint Shortness of Breath and Chest Pain    HPI [name] is a 60 y.o. male with a past medical history of end-stage renal disease on hemodialysis Monday, Wednesday, Friday who presents the emergency department with cough with sputum production. According to the patient since this morning he has been coughing with mild shortness of breath productive of white sputum. Denies any fever. Denies any chest pain. States the cough has worsened over the day today so he came to the emergency department for evaluation. Patient states he had his full dialysis session yesterday. Denies any lower extremity edema.  Past Medical History:  Diagnosis Date  . ESRD (end stage renal disease) ([place])   . HTN (hypertension) 08/02/2015  . Peripheral vascular disease [facility])     Patient Active Problem List   Diagnosis Date Noted  . Renal dialysis device, implant, or graft complication 69/48/5462  . Tobacco use 09/04/2015  . ESRD (end stage renal disease) ([place]) 08/02/2015  . Fluid overload 08/02/2015  . HTN (hypertension) 08/02/2015  . CHF (congestive heart failure) ([place]) 11/10/2014    Past Surgical History:  Procedure Laterality Date  . AV FISTULA PLACEMENT Left 2014  . PERIPHERAL VASCULAR CATHETERIZATION N/A 10/03/2014   Procedure: A/V Shuntogram/Fistulagram;  Surgeon: Algernon Huxley, MD;  Location: [place] [facility];  Service: Cardiovascular;  Laterality: N/A;  . PERIPHERAL VASCULAR CATHETERIZATION Left 10/03/2014   Procedure: A/V Shunt Intervention;  Surgeon: Algernon Huxley, MD;  Location: [place] [facility];  Service: Cardiovascular;  Laterality: Left;  . PERIPHERAL VASCULAR CATHETERIZATION Left 05/01/2015   Procedure: A/V Shuntogram/Fistulagram;  Surgeon: Algernon Huxley, MD;  Location: [place] [facility];  Service: Cardiovascular;   Laterality: Left;  . PERIPHERAL VASCULAR CATHETERIZATION N/A 05/01/2015   Procedure: A/V Shunt Intervention;  Surgeon: Algernon Huxley, MD;  Location: [place] [facility];  Service: Cardiovascular;  Laterality: N/A;    Prior to Admission medications   Medication Sig Start Date End Date Taking? Authorizing Provider  amLODipine (NORVASC) 10 MG tablet Take 10 mg by mouth daily.    Historical Provider, MD  calcium acetate (PHOSLO) 667 MG capsule Take 1,334-2,001 mg by mouth 3 (three) times daily with meals. 12/11/15   Historical Provider, MD  Calcium Carbonate Antacid (TUMS ULTRA 1000 PO) Take 2 tablets by mouth 3 (three) times daily before meals.    Historical Provider, MD  cinacalcet (SENSIPAR) 90 MG tablet Take 90 mg by mouth daily.    Historical Provider, MD  hydrALAZINE (APRESOLINE) 25 MG tablet Take 25 mg by mouth 3 (three) times daily.    Historical Provider, MD  irbesartan (AVAPRO) 300 MG tablet Take 1 tablet (300 mg total) by mouth every evening. 08/03/15   Bettey Costa, MD  loratadine (CLARITIN) 10 MG tablet Take 10 mg by mouth daily as needed for itching.     Historical Provider, MD    Allergies  Allergen Reactions  . Sulfa Antibiotics Rash    Family History  Problem Relation Age of Onset  . Hypertension Son   . Diabetes Son   . Hypertension Mother   . Diabetes Sister     Social History Social History  Substance Use Topics  . Smoking status: Current Some Day Smoker    Packs/day: 0.25    Years: 40.00    Types: Cigarettes  .  Smokeless tobacco: Never Used  . Alcohol use No    Review of Systems Constitutional: Negative for fever. Cardiovascular: Negative for chest pain. Respiratory: Mild shortness breath. Positive for cough with white sputum production. Gastrointestinal: Negative for abdominal pain Musculoskeletal: Negative for back pain. Neurological: Negative for headache 10-point ROS otherwise negative.  ____________________________________________   PHYSICAL  EXAM:  Constitutional: Alert and oriented. Well appearing and in no distress. Eyes: Normal exam ENT   Head: Normocephalic and atraumatic   Mouth/Throat: Mucous membranes are moist. Cardiovascular: Normal rate, regular rhythm.  Respiratory: Normal respiratory effort without tachypnea nor retractions. Mild left-sided expiratory wheeze. No obvious rales or rhonchi. Gastrointestinal: Soft and nontender. No distention.   Musculoskeletal: Nontender with normal range of motion in all extremities. No lower extremity tenderness or edema. Neurologic:  Normal speech and language. No gross focal neurologic deficits  Skin:  Skin is warm, dry and intact.  Psychiatric: Mood and affect are normal.  ____________________________________________    EKG  EKG reviewed and interpreted, so shows normal sinus rhythm at 94 bpm, slightly widened QRS, normal axis, largely normal intervals besides slight QTC prolongation, patient has sharply inverted lateral T waves, unchanged from 11/23/15.  ____________________________________________    RADIOLOGY  Chest x-ray consistent with pneumonia versus pulmonary edema.  ____________________________________________   INITIAL IMPRESSION / ASSESSMENT AND PLAN / ED COURSE  Pertinent labs & imaging results that were available during my care of the patient were reviewed by me and considered in my medical decision making (see chart for details).  Patient presents the emergency department shortness breath and cough productive of white sputum beginning this morning and worsening throughout the day today. Overall the patient appears well, no distress. He does frequently cough and spits out white sputum. Patient is afebrile. We will check labs, chest x-ray and close to monitor in the emergency department.  X-ray consistent with pneumonia versus pulmonary edema. Patient continues to feel short of breath, sitting on the side of the bed now due to the shortness of breath.  Troponin is slightly elevated although close to the patient's baseline. Given the possibility of pneumonia with the patient's cough with sputum production we'll cover with antibiotics patient will likely need to be dialyzed, given the pulmonary edema and a potassium of 5.7.  ____________________________________________   FINAL CLINICAL IMPRESSION(S) / ED DIAGNOSES  Dyspnea Cough Pneumonia   Harvest Dark, MD 01/06/16 2209

## 2016-01-06 NOTE — H&P (Signed)
[place] @ [facility] Admission History and Physical [employer], D.O.  ---------------------------------------------------------------------------------------------------------------------   PATIENT NAME: [name] MR#: [id number] DATE OF BIRTH: 1955/11/12 DATE OF ADMISSION: 01/06/2016 PRIMARY CARE PHYSICIAN: Pcp Not In System  REQUESTING/REFERRING PHYSICIAN: ED Dr. Kerman Passey  CHIEF COMPLAINT: Chief Complaint  Patient presents with  . Shortness of Breath  . Chest Pain    HISTORY OF PRESENT ILLNESS: [name] is a 60 y.o. male with a known history of Hypertension, peripheral vascular disease, end-stage renal disease on hemodialysis Monday Wednesday and Friday presents to the emergency department complaining of shortness of breath.  Patient was in a usual state of health until this morning when he developed cough productive of white sputum associated with shortness of breath. He presented to the emergency department when his symptoms worsened and did not improve. He did complete a full dialysis session yesterday, states that he has been taking his medications as prescribed and has not had any significant change in his diet. He states that this has happened in the past when he was incompletely dialyzed.  Otherwise there has been no change in status. Patient has been taking medication as prescribed and there has been no recent change in medication or diet.  There has been no recent illness, travel or sick contacts.    Patient denies fevers/chills, weakness, dizziness, chest pain,  N/V/C/D, abdominal pain, dysuria/frequency, changes in mental status.   EMS/ED COURSE:   Patient received Levaquin.  PAST MEDICAL HISTORY: Past Medical History:  Diagnosis Date  . ESRD (end stage renal disease) ([place])   . HTN (hypertension) 08/02/2015  . Peripheral vascular disease ([place])       PAST SURGICAL HISTORY: Past Surgical History:  Procedure Laterality Date  . AV  FISTULA PLACEMENT Left 2014  . PERIPHERAL VASCULAR CATHETERIZATION N/A 10/03/2014   Procedure: A/V Shuntogram/Fistulagram;  Surgeon: Algernon Huxley, MD;  Location: [place] [facility];  Service: Cardiovascular;  Laterality: N/A;  . PERIPHERAL VASCULAR CATHETERIZATION Left 10/03/2014   Procedure: A/V Shunt Intervention;  Surgeon: Algernon Huxley, MD;  Location: [place] [facility];  Service: Cardiovascular;  Laterality: Left;  . PERIPHERAL VASCULAR CATHETERIZATION Left 05/01/2015   Procedure: A/V Shuntogram/Fistulagram;  Surgeon: Algernon Huxley, MD;  Location: [place] [facility];  Service: Cardiovascular;  Laterality: Left;  . PERIPHERAL VASCULAR CATHETERIZATION N/A 05/01/2015   Procedure: A/V Shunt Intervention;  Surgeon: Algernon Huxley, MD;  Location: [place] [facility];  Service: Cardiovascular;  Laterality: N/A;      SOCIAL HISTORY: Social History  Substance Use Topics  . Smoking status: Current Some Day Smoker    Packs/day: 0.25    Years: 40.00    Types: Cigarettes  . Smokeless tobacco: Never Used  . Alcohol use No      FAMILY HISTORY: Family History  Problem Relation Age of Onset  . Hypertension Son   . Diabetes Son   . Hypertension Mother   . Diabetes Sister      MEDICATIONS AT HOME: Prior to Admission medications   Medication Sig Start Date End Date Taking? Authorizing Provider  amLODipine (NORVASC) 10 MG tablet Take 10 mg by mouth daily.   Yes Historical Provider, MD  calcium acetate (PHOSLO) 667 MG capsule Take 1,334-2,001 mg by mouth 3 (three) times daily with meals. 12/11/15  Yes Historical Provider, MD  Calcium Carbonate Antacid (TUMS ULTRA 1000 PO) Take 2 tablets by mouth 3 (three) times daily before meals.   Yes Historical Provider, MD  cinacalcet (SENSIPAR) 90 MG tablet Take 90 mg by mouth daily.   Yes Historical Provider, MD  hydrALAZINE (APRESOLINE) 25 MG tablet Take 25 mg by mouth 3 (three) times daily.   Yes Historical Provider, MD  irbesartan (AVAPRO) 300 MG tablet  Take 1 tablet (300 mg total) by mouth every evening. 08/03/15  Yes Sital Mody, MD  loratadine (CLARITIN) 10 MG tablet Take 10 mg by mouth daily as needed for itching.    Yes Historical Provider, MD      DRUG ALLERGIES: Allergies  Allergen Reactions  . Sulfa Antibiotics Rash     REVIEW OF SYSTEMS: CONSTITUTIONAL: No fatigue, weakness, fever, chills, weight gain/loss, headache EYES: No blurry or double vision. ENT: No tinnitus, postnasal drip, redness or soreness of the oropharynx. RESPIRATORY: Positive dyspnea, cough, wheeze, negative hemoptysis. CARDIOVASCULAR: No chest pain, orthopnea, palpitations, syncope. GASTROINTESTINAL: No nausea, vomiting, constipation, diarrhea, abdominal pain. No hematemesis, melena or hematochezia. GENITOURINARY: No dysuria, frequency, hematuria. ENDOCRINE: No polyuria or nocturia. No heat or cold intolerance. HEMATOLOGY: No anemia, bruising, bleeding. INTEGUMENTARY: No rashes, ulcers, lesions. MUSCULOSKELETAL: No pain, arthritis, swelling, gout. NEUROLOGIC: No numbness, tingling, weakness or ataxia. No seizure-type activity. PSYCHIATRIC: No anxiety, depression, insomnia.  PHYSICAL EXAMINATION: VITAL SIGNS: Blood pressure (!) 154/101, pulse 86, temperature 98.6 F (37 C), temperature source Oral, resp. rate (!) 21, SpO2 97 %.  GENERAL: 60 y.o.-year-old black male patient, well-developed, well-nourished lying in the bed in no acute distress.  Pleasant and cooperative.   HEENT: Head atraumatic, normocephalic. Pupils equal, round, reactive to light and accommodation. No scleral icterus. Extraocular muscles intact. Oropharynx is clear. Mucus membranes moist. NECK: Supple, full range of motion. No JVD, no bruit heard. No cervical lymphadenopathy. CHEST: Normal breath sounds bilaterally. No wheezing, rales, rhonchi or crackles. No use of accessory muscles of respiration.  No reproducible chest wall tenderness.  CARDIOVASCULAR: S1, S2 normal. No murmurs, rubs,  or gallops appreciated. Cap refill <2 seconds. ABDOMEN: Soft, nontender, nondistended. No rebound, guarding, rigidity. Normoactive bowel sounds present in all four quadrants. No organomegaly or mass. EXTREMITIES: Full range of motion. No pedal edema, cyanosis, or clubbing. NEUROLOGIC: Cranial nerves II through XII are grossly intact with no focal sensorimotor deficit. Muscle strength 5/5 in all extremities. Sensation intact. Gait not checked. PSYCHIATRIC: The patient is alert and oriented x 3. Normal affect, mood, thought content. SKIN: Warm, dry, and intact without obvious rash, lesion, or ulcer.  LABORATORY PANEL:  CBC  Recent Labs Lab 01/06/16 2032  WBC 6.6  HGB 9.6*  HCT 29.1*  PLT 212   ----------------------------------------------------------------------------------------------------------------- Chemistries  Recent Labs Lab 01/06/16 2032  NA 140  K 5.7*  CL 103  CO2 23  GLUCOSE 78  BUN 52*  CREATININE 9.02*  CALCIUM 8.0*  AST 61*  ALT 25  ALKPHOS 64  BILITOT 0.6   ------------------------------------------------------------------------------------------------------------------ Cardiac Enzymes  Recent Labs Lab 01/06/16 2032  TROPONINI 0.13*   ------------------------------------------------------------------------------------------------------------------  RADIOLOGY: Dg Chest 2 View  Result Date: 01/06/2016 CLINICAL DATA:  Dyspnea and chest pain starting this morning. Patient on dialysis. Productive cough. EXAM: CHEST  2 VIEW COMPARISON:  11/23/2015 FINDINGS: There is stable cardiomegaly with aortic atherosclerosis and mild pulmonary vascular congestion similar to previous exam. No acute pneumonic consolidations are identified. There is subtle hazy opacities at the right lung base which can not entirely exclude a superimposed pneumonia would believed more likely related to pulmonary edema. IMPRESSION: Stable cardiomegaly with mild CHF. Faint hazy opacity at  the right lung base may be related  to a superimposed pneumonia but more likely related to edema. Electronically Signed   By: Ashley Royalty M.D.   On: 01/06/2016 21:00    EKG: Normal sinus rhythm at 94 bpm with normal axis, T-wave inversions laterally and QT prolongation and nonspecific ST-T wave changes.   IMPRESSION AND PLAN:  This is a 60 y.o. male with a history of hypertension, peripheral vascular disease and end-stage renal disease on hemodialysis now being admitted with: 1. Shortness of breath. CXR suggestive of mild pulmonary edema versus pneumonia. Patient has received 1 dose of Levaquin in the emergency department however he has no clinical signs suggestive of pneumonia. He is afebrile with a normal white count. We will admit him to the hospital for dialysis in the morning, nebulizer therapy, check sputum culture and I will administer a dose of Lasix tonight since he does make urine. 2. Elevated troponin, baseline. We'll monitor the patient on telemetry and trend troponins however his baseline ranges from 0.07-0.13 given his history of end-stage renal disease. 3. Hyperkalemia-we'll repeat BMP in a.m. Patient is due to have dialysis in the morning. 4. Anemia, chronic but slightly lower than baseline-repeat CBC in the a.m. 5. History of hypertension-continue Norvasc, hydralazine, Avapro 6. History of end-stage renal disease on hemodialysis-continue PhosLo, Tums, Sensipar. Nephrology consultation for hemodialysis in a.m. 6. History of allergies-continue Claritin   Diet/Nutrition: Heart healthy Fluids: Hep-Lock DVT Px: Lovenox, SCDs and early ambulation Code Status: Full  All the records are reviewed and case discussed with ED provider. Management plans discussed with the patient and/or family who express understanding and agree with plan of care.   TOTAL TIME TAKING CARE OF THIS PATIENT: 60 minutes.   Adaria Hole D.O. on 01/06/2016 at 11:01 PM Between 7am to 6pm - Pager -  [phone] After 6pm go to [email or web address] - Proofreader [employer] [facility] [place] [facility]ists Office [phone] CC: Primary care physician; Pcp Not In System     Note: This dictation was prepared with Dragon dictation along with smaller phrase technology. Any transcriptional errors that result from this process are unintentional.

## 2016-01-07 DIAGNOSIS — J189 Pneumonia, unspecified organism: Secondary | ICD-10-CM | POA: Diagnosis present

## 2016-01-07 DIAGNOSIS — J9601 Acute respiratory failure with hypoxia: Secondary | ICD-10-CM | POA: Diagnosis not present

## 2016-01-07 LAB — BASIC METABOLIC PANEL
Anion gap: 17 — ABNORMAL HIGH (ref 5–15)
BUN: 60 mg/dL — AB (ref 6–20)
CALCIUM: 7.7 mg/dL — AB (ref 8.9–10.3)
CO2: 21 mmol/L — ABNORMAL LOW (ref 22–32)
Chloride: 103 mmol/L (ref 101–111)
Creatinine, Ser: 9.49 mg/dL — ABNORMAL HIGH (ref 0.61–1.24)
GFR calc Af Amer: 6 mL/min — ABNORMAL LOW (ref >60)
GFR, EST NON AFRICAN AMERICAN: 5 mL/min — AB (ref >60)
GLUCOSE: 98 mg/dL (ref 65–99)
Potassium: 6.4 mmol/L (ref 3.5–5.1)
SODIUM: 141 mmol/L (ref 135–145)

## 2016-01-07 LAB — CBC
HCT: 30.4 % — ABNORMAL LOW (ref 40.0–52.0)
HCT: 27.8 % — ABNORMAL LOW (ref 40.0–52.0)
Hemoglobin: 10 g/dL — ABNORMAL LOW (ref 13.0–18.0)
Hemoglobin: 9.3 g/dL — ABNORMAL LOW (ref 13.0–18.0)
MCH: 31.8 pg (ref 26.0–34.0)
MCH: 31.7 pg (ref 26.0–34.0)
MCHC: 32.8 g/dL (ref 32.0–36.0)
MCHC: 33.5 g/dL (ref 32.0–36.0)
MCV: 96.7 fL (ref 80.0–100.0)
MCV: 94.7 fL (ref 80.0–100.0)
PLATELETS: 216 10*3/uL (ref 150–440)
PLATELETS: 199 10*3/uL (ref 150–440)
RBC: 3.14 MIL/uL — ABNORMAL LOW (ref 4.40–5.90)
RBC: 2.94 MIL/uL — AB (ref 4.40–5.90)
RDW: 16.9 % — AB (ref 11.5–14.5)
RDW: 16.8 % — AB (ref 11.5–14.5)
WBC: 5.1 10*3/uL (ref 3.8–10.6)
WBC: 5.5 10*3/uL (ref 3.8–10.6)

## 2016-01-07 LAB — RENAL FUNCTION PANEL
ALBUMIN: 3.6 g/dL (ref 3.5–5.0)
Anion gap: 14 (ref 5–15)
BUN: 65 mg/dL — AB (ref 6–20)
CO2: 21 mmol/L — ABNORMAL LOW (ref 22–32)
CREATININE: 10.07 mg/dL — AB (ref 0.61–1.24)
Calcium: 7.7 mg/dL — ABNORMAL LOW (ref 8.9–10.3)
Chloride: 103 mmol/L (ref 101–111)
GFR, EST AFRICAN AMERICAN: 6 mL/min — AB (ref >60)
GFR, EST NON AFRICAN AMERICAN: 5 mL/min — AB (ref >60)
Glucose, Bld: 65 mg/dL (ref 65–99)
PHOSPHORUS: 8.1 mg/dL — AB (ref 2.5–4.6)
Potassium: 6.7 mmol/L (ref 3.5–5.1)
Sodium: 138 mmol/L (ref 135–145)

## 2016-01-07 LAB — MRSA PCR SCREENING: MRSA BY PCR: NEGATIVE

## 2016-01-07 MED ORDER — ACETAMINOPHEN 650 MG RE SUPP
650.0000 mg | Freq: Four times a day (QID) | RECTAL | Status: DC | PRN
Start: 2016-01-07 — End: 2016-01-07

## 2016-01-07 MED ORDER — SODIUM CHLORIDE 0.9 % IV SOLN
1.0000 g | Freq: Once | INTRAVENOUS | Status: AC
  Administered 2016-01-07: 1 g via INTRAVENOUS
  Filled 2016-01-07: qty 10

## 2016-01-07 MED ORDER — SODIUM CHLORIDE 0.9% FLUSH
3.0000 mL | Freq: Two times a day (BID) | INTRAVENOUS | Status: DC
  Administered 2016-01-07 (×2): 3 mL via INTRAVENOUS

## 2016-01-07 MED ORDER — SODIUM CHLORIDE 0.9 % IV SOLN: 250.0000 mL | INTRAVENOUS | Status: DC | PRN

## 2016-01-07 MED ORDER — SODIUM CHLORIDE 0.9% FLUSH
3.0000 mL | Freq: Two times a day (BID) | INTRAVENOUS | Status: DC
  Administered 2016-01-07: 3 mL via INTRAVENOUS

## 2016-01-07 MED ORDER — LORAZEPAM 2 MG/ML IJ SOLN
1.0000 mg | Freq: Once | INTRAMUSCULAR | Status: AC
  Administered 2016-01-07: 1 mg via INTRAVENOUS
  Filled 2016-01-07: qty 1

## 2016-01-07 MED ORDER — SENNOSIDES-DOCUSATE SODIUM 8.6-50 MG PO TABS: 1.0000 | ORAL_TABLET | Freq: Every evening | ORAL | Status: DC | PRN

## 2016-01-07 MED ORDER — BISACODYL 5 MG PO TBEC: 5.0000 mg | DELAYED_RELEASE_TABLET | Freq: Every day | ORAL | Status: DC | PRN

## 2016-01-07 MED ORDER — LEVOFLOXACIN IN D5W 750 MG/150ML IV SOLN: 750.0000 mg | Freq: Once | INTRAVENOUS | Status: DC

## 2016-01-07 MED ORDER — HYDROCODONE-ACETAMINOPHEN 5-325 MG PO TABS: 1.0000 | ORAL_TABLET | ORAL | Status: DC | PRN

## 2016-01-07 MED ORDER — DM-GUAIFENESIN ER 30-600 MG PO TB12: 1.0000 | ORAL_TABLET | Freq: Two times a day (BID) | ORAL | Status: DC

## 2016-01-07 MED ORDER — HEPARIN SODIUM (PORCINE) 5000 UNIT/ML IJ SOLN
5000.0000 [IU] | Freq: Three times a day (TID) | INTRAMUSCULAR | Status: DC
  Filled 2016-01-07: qty 1

## 2016-01-07 MED ORDER — DEXTROMETHORPHAN POLISTIREX ER 30 MG/5ML PO SUER
30.0000 mg | Freq: Two times a day (BID) | ORAL | Status: DC
  Administered 2016-01-07 (×2): 30 mg via ORAL
  Filled 2016-01-07 (×3): qty 5

## 2016-01-07 MED ORDER — SODIUM CHLORIDE 0.9% FLUSH: 3.0000 mL | INTRAVENOUS | Status: DC | PRN

## 2016-01-07 MED ORDER — MAGNESIUM CITRATE PO SOLN
1.0000 | Freq: Once | ORAL | Status: DC | PRN
Start: 2016-01-07 — End: 2016-01-07
  Filled 2016-01-07: qty 296

## 2016-01-07 MED ORDER — FUROSEMIDE 10 MG/ML IJ SOLN
20.0000 mg | Freq: Once | INTRAMUSCULAR | Status: AC
Start: 2016-01-07 — End: 2016-01-07
  Administered 2016-01-07: 20 mg via INTRAVENOUS
  Filled 2016-01-07: qty 2

## 2016-01-07 MED ORDER — FUROSEMIDE 10 MG/ML IJ SOLN
80.0000 mg | Freq: Once | INTRAMUSCULAR | Status: AC
  Administered 2016-01-07: 80 mg via INTRAVENOUS
  Filled 2016-01-07: qty 8

## 2016-01-07 MED ORDER — ONDANSETRON HCL 4 MG PO TABS: 4.0000 mg | ORAL_TABLET | Freq: Four times a day (QID) | ORAL | Status: DC | PRN

## 2016-01-07 MED ORDER — ACETAMINOPHEN 325 MG PO TABS
650.0000 mg | ORAL_TABLET | Freq: Four times a day (QID) | ORAL | Status: DC | PRN
Start: 2016-01-07 — End: 2016-01-07

## 2016-01-07 MED ORDER — SODIUM POLYSTYRENE SULFONATE 15 GM/60ML PO SUSP: 15.0000 g | Freq: Once | ORAL | Status: DC

## 2016-01-07 MED ORDER — IPRATROPIUM-ALBUTEROL 0.5-2.5 (3) MG/3ML IN SOLN
3.0000 mL | Freq: Four times a day (QID) | RESPIRATORY_TRACT | Status: DC | PRN
  Administered 2016-01-07: 3 mL via RESPIRATORY_TRACT
  Filled 2016-01-07: qty 3

## 2016-01-07 MED ORDER — EPOETIN ALFA 4000 UNIT/ML IJ SOLN
4000.0000 [IU] | INTRAMUSCULAR | Status: DC
  Administered 2016-01-07: 4000 [IU] via INTRAVENOUS
  Filled 2016-01-07: qty 1

## 2016-01-07 MED ORDER — ZOLPIDEM TARTRATE 5 MG PO TABS: 5.0000 mg | ORAL_TABLET | Freq: Every evening | ORAL | Status: DC | PRN

## 2016-01-07 MED ORDER — ONDANSETRON HCL 4 MG/2ML IJ SOLN: 4.0000 mg | Freq: Four times a day (QID) | INTRAMUSCULAR | Status: DC | PRN

## 2016-01-07 MED ORDER — GUAIFENESIN ER 600 MG PO TB12
600.0000 mg | ORAL_TABLET | Freq: Two times a day (BID) | ORAL | Status: DC
  Administered 2016-01-07 (×2): 600 mg via ORAL
  Filled 2016-01-07 (×2): qty 1

## 2016-01-07 NOTE — Significant Event (Signed)
Rapid Response Event Note  Overview: Time Called: 0600 Arrival Time: 0602 Event Type: Respiratory  Initial Focused Assessment: Pt alert, tachypneic, clear breath sounds bilaterally, anxious, hypertensive, K6.4. Pt. receives Mon., Wed., Fri. HD in the early AM.  Pt complaining of shortness of breath. 100% O2 sats on 2L nasal cannula. Bedside RN gave 20 mg. Lasix at 03:35 without relief.    Interventions: Spoke to Dr. Juleen China who ordered 80mg . Lasix once, 1mg  ativan for anxiety.  Dr. Juleen China stated pt. will get HD ASAP.  Dr. Estanislado Pandy to bedside to assess pt. And agrees with plan of care. Nonrebreather placed on pt. per request for comfort.  Plan of Care (if not transferred):  Bedside RN to give ordered meds. Pt will receive HD this AM.   Event Summary: Name of Physician Notified: Dr. Estanislado Pandy at [phone]  Name of Consulting Physician Notified: Dr. Juleen China at [phone]  Outcome: Stayed in room and stabalized  Event End Time: 0620  Enola Siebers R

## 2016-01-07 NOTE — Progress Notes (Signed)
Notified Dr. Benjie Karvonen of critical lab of potassium at 6.7

## 2016-01-07 NOTE — Progress Notes (Signed)
Called by RN taking care of patient, that potassium is elevated. Potassium level is 6.4 Oral kayexalate ordered and IV calcium gluconate ordered Nephrology consult for dialysis

## 2016-01-07 NOTE — ED Notes (Signed)
Pt assisted to BR.

## 2016-01-07 NOTE — Progress Notes (Signed)
[place] at [place] NAME: [name]    MR#:  [id number]  DATE OF BIRTH:  09-Dec-1955  SUBJECTIVE:  Rapid response called this  Am for SOB on NRB this am  REVIEW OF SYSTEMS:    Review of Systems  Constitutional: Negative.  Negative for chills, fever and malaise/fatigue.  HENT: Negative.  Negative for ear discharge, ear pain, hearing loss, nosebleeds and sore throat.   Eyes: Negative.  Negative for blurred vision and pain.  Respiratory: Negative.  Negative for cough, hemoptysis, shortness of breath and wheezing.   Cardiovascular: Negative.  Negative for chest pain, palpitations and leg swelling.  Gastrointestinal: Negative.  Negative for abdominal pain, blood in stool, diarrhea, nausea and vomiting.  Genitourinary: Negative.  Negative for dysuria.  Musculoskeletal: Negative.  Negative for back pain.  Skin: Negative.   Neurological: Negative for dizziness, tremors, speech change, focal weakness, seizures and headaches.  Endo/Heme/Allergies: Negative.  Does not bruise/bleed easily.  Psychiatric/Behavioral: Negative.  Negative for depression, hallucinations and suicidal ideas.    Tolerating Diet:yes      DRUG ALLERGIES:   Allergies  Allergen Reactions  . Sulfa Antibiotics Rash    VITALS:  Blood pressure (!) 167/109, pulse 81, temperature 97.3 F (36.3 C), temperature source Oral, resp. rate 16, height 5\' 8"  (1.727 m), weight 78.1 kg (172 lb 2.9 oz), SpO2 100 %.  PHYSICAL EXAMINATION:   Physical Exam  Constitutional: He is oriented to person, place, and time and well-developed, well-nourished, and in no distress. No distress.  HENT:  Head: Normocephalic.  Wearing NRB  Eyes: No scleral icterus.  Neck: Normal range of motion. Neck supple. No JVD present. No tracheal deviation present.  Cardiovascular: Normal rate, regular rhythm and normal heart sounds.  Exam reveals no gallop and no friction rub.   No murmur  heard. Pulmonary/Chest: Effort normal and breath sounds normal. No respiratory distress. He has no wheezes. He has no rales. He exhibits no tenderness.  Abdominal: Soft. Bowel sounds are normal. He exhibits no distension and no mass. There is no tenderness. There is no rebound and no guarding.  Musculoskeletal: Normal range of motion. He exhibits no edema.  Neurological: He is alert and oriented to person, place, and time.  Skin: Skin is warm. No rash noted. No erythema.  Psychiatric: Affect and judgment normal.      LABORATORY PANEL:   CBC  Recent Labs Lab 01/07/16 0910  WBC 5.5  HGB 9.3*  HCT 27.8*  PLT 199   ------------------------------------------------------------------------------------------------------------------  Chemistries   Recent Labs Lab 01/06/16 2032  01/07/16 0900  NA 140  < > 138  K 5.7*  < > 6.7*  CL 103  < > 103  CO2 23  < > 21*  GLUCOSE 78  < > 65  BUN 52*  < > 65*  CREATININE 9.02*  < > 10.07*  CALCIUM 8.0*  < > 7.7*  AST 61*  --   --   ALT 25  --   --   ALKPHOS 64  --   --   BILITOT 0.6  --   --   < > = values in this interval not displayed. ------------------------------------------------------------------------------------------------------------------  Cardiac Enzymes  Recent Labs Lab 01/06/16 2032  TROPONINI 0.13*   ------------------------------------------------------------------------------------------------------------------  RADIOLOGY:  Dg Chest 2 View  Result Date: 01/06/2016 CLINICAL DATA:  Dyspnea and chest pain starting this morning. Patient on dialysis. Productive cough. EXAM: CHEST  2 VIEW COMPARISON:  11/23/2015 FINDINGS:  There is stable cardiomegaly with aortic atherosclerosis and mild pulmonary vascular congestion similar to previous exam. No acute pneumonic consolidations are identified. There is subtle hazy opacities at the right lung base which can not entirely exclude a superimposed pneumonia would believed more  likely related to pulmonary edema. IMPRESSION: Stable cardiomegaly with mild CHF. Faint hazy opacity at the right lung base may be related to a superimposed pneumonia but more likely related to edema. Electronically Signed   By: Ashley Royalty M.D.   On: 01/06/2016 21:00     ASSESSMENT AND PLAN:   60 year old male with end-stage renal disease on hemodialysis Monday, was him Friday who presented with shortness of breath and found to have pulmonary edema on chest x-ray.  1. Acute hypoxic respiratory failure in the setting of pulmonary edema due to inadequate fluid removal during dialysis: Due to shortness of breath rapid response was called and he was temporarily placed on nonrebreather. He will undergo dialysis which will help.  2. ESRD on hemodialysis with hyperkalemia: Hyperkalemia is addressed during dialysis.  Patient needs longer dialysis sessions to remove fluid. This was discussed with nephrologist.  3. Elevation in troponin is due to poor renal clearance and not ACS.  4. Anemia of chronic disease: Hemoglobin remained relatively stable Continue Epogen with dialysis 5. Essential hypertension: Continue Norvasc, hydralazine and Avapro    D/w nurse and Dr Abigail Butts    Management plans discussed with the patient and he is in agreement.  CODE STATUS: ful  TOTAL TIME TAKING CARE OF THIS PATIENT: 30 minutes.     POSSIBLE D/C today, DEPENDING ON CLINICAL CONDITION.   Samira Acero M.D on 01/07/2016 at 11:52 AM  Between 7am to 6pm - Pager - [phone] After 6pm go to [email or web address] - password [email or web address] [facility]  Office  [phone]  CC: Primary care physician; [facility]  Note: This dictation was prepared with Dragon dictation along with smaller phrase technology. Any transcriptional errors that result from this process are unintentional.

## 2016-01-07 NOTE — Progress Notes (Signed)
Rapid response team called due to pt's increased work of breathing. Nephrologist called, orders placed for lasix and ativan.

## 2016-01-07 NOTE — Progress Notes (Signed)
Dialysis completed without issue. 2.9L UF per order

## 2016-01-07 NOTE — Progress Notes (Signed)
Post Dialysis assessment Pt now on Room air

## 2016-01-07 NOTE — Progress Notes (Signed)
Pt A and O x 4. VSS. Pt tolerating diet well. No complaints of pain or nausea. IV removed intact, no new prescriptions given. Pt voiced understanding of discharge instructions with no further questions. Pt discharged via wheelchair with axillary.

## 2016-01-07 NOTE — Discharge Summary (Signed)
[place] at [place] NAME: [name]    MR#:  [id number]  DATE OF BIRTH:  [birth year]  DATE OF ADMISSION:  01/06/2016 ADMITTING PHYSICIAN: Harvie Bridge, DO  DATE OF DISCHARGE:01/07/2016  PRIMARY CARE PHYSICIAN: [facility]    ADMISSION DIAGNOSIS:  Acute pulmonary edema (HCC) [J81.0] Dyspnea, unspecified type [R06.00]   DISCHARGE DIAGNOSIS:  Active Problems:   Acute hypoxic respiratory failure due to pulmonary edema from ESRD   SECONDARY DIAGNOSIS:   Past Medical History:  Diagnosis Date  . ESRD (end stage renal disease) ([place])   . HTN (hypertension) 08/02/2015  . Peripheral vascular disease [facility])     HOSPITAL COURSE:   60 year old male with end-stage renal disease on hemodialysis Monday, was him Friday who presented with shortness of breath and found to have pulmonary edema on chest x-ray.  1. Acute hypoxic respiratory failure in the setting of pulmonary edema due to inadequate fluid removal during dialysis: Due to shortness of breath rapid response was called and he was temporarily placed on nonrebreather. He underwent dialysis and is no longer hypoxic.  2. ESRD on hemodialysis with hyperkalemia: Hyperkalemia was addressed during dialysis.  Patient needs longer dialysis sessions to remove fluid. This was discussed with nephrologist.  3. Elevation in troponin is due to poor renal clearance and not ACS.  4. Anemia of chronic disease: Hemoglobin remained relatively stable Continue Epogen with dialysis 5. Essential hypertension: Continue Norvasc, hydralazine and Avapro    DISCHARGE CONDITIONS AND DIET:   Stable for discharge on renal diet  CONSULTS OBTAINED:  Treatment Team:  Lavonia Dana, MD  DRUG ALLERGIES:   Allergies  Allergen Reactions  . Sulfa Antibiotics Rash    DISCHARGE MEDICATIONS:   Current Discharge Medication List    CONTINUE these medications which have NOT CHANGED   Details   amLODipine (NORVASC) 10 MG tablet Take 10 mg by mouth daily.    calcium acetate (PHOSLO) 667 MG capsule Take 1,334-2,001 mg by mouth 3 (three) times daily with meals.    Calcium Carbonate Antacid (TUMS ULTRA 1000 PO) Take 2 tablets by mouth 3 (three) times daily before meals.    cinacalcet (SENSIPAR) 90 MG tablet Take 90 mg by mouth daily.    hydrALAZINE (APRESOLINE) 25 MG tablet Take 25 mg by mouth 3 (three) times daily.    irbesartan (AVAPRO) 300 MG tablet Take 1 tablet (300 mg total) by mouth every evening. Qty: 30 tablet, Refills: 0    loratadine (CLARITIN) 10 MG tablet Take 10 mg by mouth daily as needed for itching.               Today   CHIEF COMPLAINT:   Patient is breathing better this morning.   VITAL SIGNS:  Blood pressure (!) 167/109, pulse 81, temperature 97.3 F (36.3 C), temperature source Oral, resp. rate 16, height 5\' 8"  (1.727 m), weight 78.1 kg (172 lb 2.9 oz), SpO2 100 %.   REVIEW OF SYSTEMS:  Review of Systems  Constitutional: Negative.  Negative for chills, fever and malaise/fatigue.  HENT: Negative.  Negative for ear discharge, ear pain, hearing loss, nosebleeds and sore throat.   Eyes: Negative.  Negative for blurred vision and pain.  Respiratory: Negative for cough, hemoptysis, sputum production and wheezing.   Cardiovascular: Negative.  Negative for chest pain, palpitations and leg swelling.  Gastrointestinal: Negative.  Negative for abdominal pain, blood in stool, diarrhea, nausea and vomiting.  Genitourinary: Negative.  Negative for dysuria.  Musculoskeletal:  Negative.  Negative for back pain.  Skin: Negative.   Neurological: Negative for dizziness, tremors, speech change, focal weakness, seizures and headaches.  Endo/Heme/Allergies: Negative.  Does not bruise/bleed easily.  Psychiatric/Behavioral: Negative.  Negative for depression, hallucinations and suicidal ideas.     PHYSICAL EXAMINATION:  GENERAL:  60 y.o.-year-old patient  lying in the bed with no acute distress.  NECK:  Supple, no jugular venous distention. No thyroid enlargement, no tenderness.  LUNGS: Normal breath sounds bilaterally, no wheezing, rales,rhonchi  No use of accessory muscles of respiration.  CARDIOVASCULAR: S1, S2 normal. No murmurs, rubs, or gallops.  ABDOMEN: Soft, non-tender, non-distended. Bowel sounds present. No organomegaly or mass.  EXTREMITIES: No pedal edema, cyanosis, or clubbing.  PSYCHIATRIC: The patient is alert and oriented x 3.  SKIN: No obvious rash, lesion, or ulcer.   DATA REVIEW:   CBC  Recent Labs Lab 01/07/16 0910  WBC 5.5  HGB 9.3*  HCT 27.8*  PLT 199    Chemistries   Recent Labs Lab 01/06/16 2032  01/07/16 0900  NA 140  < > 138  K 5.7*  < > 6.7*  CL 103  < > 103  CO2 23  < > 21*  GLUCOSE 78  < > 65  BUN 52*  < > 65*  CREATININE 9.02*  < > 10.07*  CALCIUM 8.0*  < > 7.7*  AST 61*  --   --   ALT 25  --   --   ALKPHOS 64  --   --   BILITOT 0.6  --   --   < > = values in this interval not displayed.  Cardiac Enzymes  Recent Labs Lab 01/06/16 2032  TROPONINI 0.13*    Microbiology Results  @[email or web address] @  RADIOLOGY:  Dg Chest 2 View  Result Date: 01/06/2016 CLINICAL DATA:  Dyspnea and chest pain starting this morning. Patient on dialysis. Productive cough. EXAM: CHEST  2 VIEW COMPARISON:  11/23/2015 FINDINGS: There is stable cardiomegaly with aortic atherosclerosis and mild pulmonary vascular congestion similar to previous exam. No acute pneumonic consolidations are identified. There is subtle hazy opacities at the right lung base which can not entirely exclude a superimposed pneumonia would believed more likely related to pulmonary edema. IMPRESSION: Stable cardiomegaly with mild CHF. Faint hazy opacity at the right lung base may be related to a superimposed pneumonia but more likely related to edema. Electronically Signed   By: Ashley Royalty M.D.   On: 01/06/2016 21:00      Management plans  discussed with the patient and he is in agreement. Stable for discharge home  Patient should follow up with pcp  CODE STATUS:     Code Status Orders        Start     Ordered   01/07/16 0133  Full code  Continuous     01/07/16 0132    Code Status History    Date Active Date Inactive Code Status Order ID Comments User Context   08/02/2015 11:16 PM 08/04/2015 11:11 PM Full Code [id number]  Quintella Baton, MD Inpatient   12/02/2014  8:52 PM 12/04/2014  5:53 PM Full Code [id number]  Vaughan Basta, MD Inpatient   11/10/2014 11:23 PM 11/12/2014  3:06 PM Full Code [id number]  Gladstone Lighter, MD Inpatient      TOTAL TIME TAKING CARE OF THIS PATIENT: 37 minutes.    Note: This dictation was prepared with Dragon dictation along with smaller phrase technology. Any transcriptional errors that result from this process  are unintentional.  Adilson Grafton M.D on 01/07/2016 at 11:46 AM  Between 7am to 6pm - Pager - [phone] After 6pm go to [email or web address] - password [email or web address] [facility]  Office  [phone]  CC: Primary care physician; [place]

## 2016-01-07 NOTE — Progress Notes (Signed)
Pre dialysis assessment 

## 2016-01-07 NOTE — Progress Notes (Signed)
Dialysis started 

## 2016-01-07 NOTE — Progress Notes (Signed)
Pre dialysis  

## 2016-01-07 NOTE — Progress Notes (Signed)
Received critical potassium 6.4. Pt experiencing shortness of breath, increased work of breathing, and wheezing. Pt frustrated and requesting dialysis. MD notified. New orders placed for potassium level.

## 2016-01-07 NOTE — Progress Notes (Signed)
Spoke with Dr. Juleen China and do not adminster K exalate as pt is going to be going down for dialysis

## 2016-01-07 NOTE — Care Management Obs Status (Signed)
[facility] NOTIFICATION   Patient Details  Name: [name] MRN: [id number] Date of Birth: 1955-07-22   Medicare Observation Status Notification Given:  No (Admitted less than 24 hours)    Beverly Sessions, RN 01/07/2016, 1:43 PM

## 2016-01-07 NOTE — Progress Notes (Signed)
[facility]  ROUNDING NOTE   Subjective:   [name] admitted on 01/06/2016 for Acute pulmonary edema ([place]) [J81.0] Dyspnea, unspecified type [R06.00] Community acquired pneumonia, unspecified laterality [J18.9]   Patient states of shortness of breath.  Seen and examined on hemodialysis. UF goal of 2.5 litres.     HEMODIALYSIS FLOWSHEET:  Blood Flow Rate (mL/min): 400 mL/min Arterial Pressure (mmHg): -90 mmHg Venous Pressure (mmHg): 140 mmHg Transmembrane Pressure (mmHg): 50 mmHg Ultrafiltration Rate (mL/min): 970 mL/min Dialysate Flow Rate (mL/min): 600 ml/min Conductivity: Machine : 13.6 Conductivity: Machine : 13.6 Dialysis Fluid Bolus: Normal Saline Bolus Amount (mL): 250 mL Dialysate Change:  (3k 2.5ca) Intra-Hemodialysis Comments: 250cc prime, initiated via L AVF without issue. no heparin. Pt on non rebreather mask, sats 100%. no current complaints   Objective:  Vital signs in last 24 hours:  Temp:  [97.3 F (36.3 C)-98.6 F (37 C)] 97.3 F (36.3 C) (11/15 0906) Pulse Rate:  [75-89] 78 (11/15 0912) Resp:  [12-30] 22 (11/15 0912) BP: (108-157)/(57-105) 141/104 (11/15 0912) SpO2:  [93 %-100 %] 100 % (11/15 0912) Weight:  [78.1 kg (172 lb 2.9 oz)-78.3 kg (172 lb 9.9 oz)] 78.1 kg (172 lb 2.9 oz) (11/15 0906)  Weight change:  Filed Weights   01/07/16 0239 01/07/16 0906  Weight: 78.3 kg (172 lb 9.9 oz) 78.1 kg (172 lb 2.9 oz)    Intake/Output: I/O last 3 completed shifts: In: 3 [I.V.:3] Out: -    Intake/Output this shift:  No intake/output data recorded.  Physical Exam: General: NAD, laying in bed  Head: Normocephalic, atraumatic. Moist oral mucosal membranes  Eyes: Anicteric, PERRL  Neck: Supple, trachea midline  Lungs:  Crackles bilaterally  Heart: Regular rate and rhythm  Abdomen:  Soft, nontender,   Extremities: no peripheral edema.  Neurologic: Nonfocal, moving all four extremities  Skin: No lesions  Access: Left forearm AVF     Basic Metabolic Panel:  Recent Labs Lab 01/06/16 2032 01/07/16 0428  NA 140 141  K 5.7* 6.4*  CL 103 103  CO2 23 21*  GLUCOSE 78 98  BUN 52* 60*  CREATININE 9.02* 9.49*  CALCIUM 8.0* 7.7*    Liver Function Tests:  Recent Labs Lab 01/06/16 2032  AST 61*  ALT 25  ALKPHOS 64  BILITOT 0.6  PROT 7.8  ALBUMIN 3.7   No results for input(s): LIPASE, AMYLASE in the last 168 hours. No results for input(s): AMMONIA in the last 168 hours.  CBC:  Recent Labs Lab 01/06/16 2032 01/07/16 0428  WBC 6.6 5.1  HGB 9.6* 10.0*  HCT 29.1* 30.4*  MCV 95.1 96.7  PLT 212 216    Cardiac Enzymes:  Recent Labs Lab 01/06/16 2032  TROPONINI 0.13*    BNP: Invalid input(s): POCBNP  CBG: No results for input(s): GLUCAP in the last 168 hours.  Microbiology: Results for orders placed or performed during the hospital encounter of 01/06/16  Blood culture (routine x 2)     Status: None (Preliminary result)   Collection Time: 01/06/16 10:42 PM  Result Value Ref Range Status   Specimen Description BLOOD  RIGHT AC  Final   Special Requests   Final    BOTTLES DRAWN AEROBIC AND ANAEROBIC  ANA 3ML AER 6ML   Culture NO GROWTH < 12 HOURS  Final   Report Status PENDING  Incomplete  Blood culture (routine x 2)     Status: None (Preliminary result)   Collection Time: 01/06/16 10:42 PM  Result Value Ref Range  Status   Specimen Description BLOOD RIGHT FOREARM  Final   Special Requests   Final    BOTTLES DRAWN AEROBIC AND ANAEROBIC  ANA 3 ML AER 5 ML   Culture NO GROWTH < 12 HOURS  Final   Report Status PENDING  Incomplete  MRSA PCR Screening     Status: None   Collection Time: 01/07/16  3:38 AM  Result Value Ref Range Status   MRSA by PCR NEGATIVE NEGATIVE Final    Comment:        The GeneXpert MRSA Assay (FDA approved for NASAL specimens only), is one component of a comprehensive MRSA colonization surveillance program. It is not intended to diagnose MRSA infection nor to  guide or monitor treatment for MRSA infections.     Coagulation Studies: No results for input(s): LABPROT, INR in the last 72 hours.  Urinalysis: No results for input(s): COLORURINE, LABSPEC, PHURINE, GLUCOSEU, HGBUR, BILIRUBINUR, KETONESUR, PROTEINUR, UROBILINOGEN, NITRITE, LEUKOCYTESUR in the last 72 hours.  Invalid input(s): APPERANCEUR    Imaging: Dg Chest 2 View  Result Date: 01/06/2016 CLINICAL DATA:  Dyspnea and chest pain starting this morning. Patient on dialysis. Productive cough. EXAM: CHEST  2 VIEW COMPARISON:  11/23/2015 FINDINGS: There is stable cardiomegaly with aortic atherosclerosis and mild pulmonary vascular congestion similar to previous exam. No acute pneumonic consolidations are identified. There is subtle hazy opacities at the right lung base which can not entirely exclude a superimposed pneumonia would believed more likely related to pulmonary edema. IMPRESSION: Stable cardiomegaly with mild CHF. Faint hazy opacity at the right lung base may be related to a superimposed pneumonia but more likely related to edema. Electronically Signed   By: Ashley Royalty M.D.   On: 01/06/2016 21:00     Medications:    . guaiFENesin  600 mg Oral BID   And  . dextromethorphan  30 mg Oral BID  . heparin  5,000 Units Subcutaneous Q8H  . sodium chloride flush  3 mL Intravenous Q12H  . sodium chloride flush  3 mL Intravenous Q12H   sodium chloride, acetaminophen **OR** acetaminophen, bisacodyl, HYDROcodone-acetaminophen, ipratropium-albuterol, magnesium citrate, ondansetron **OR** ondansetron (ZOFRAN) IV, senna-docusate, sodium chloride flush, zolpidem  Assessment/ Plan:  [name] is a 60 y.o. black male with hypertension, hepatitis C, ESRD started HD 10/13, anemia of CKD,   MWF [place].   1. End Stage Renal Disease with hyperkalemia: with complication of dialysis device.  - seen and examined on hemodialysis. Tolerating treatment well. UF goal of 3  litres  2. Hypertension:  Elevated. Volume driven - restart home regimen - UF with HD  3. Anemia of chronic kidney disease: hgb 10 - epo with dialysis treatment.   4. Secondary Hyperparathyroidism - calcium acetate with meals.    LOS: 0 Rhianne Soman 11/15/20179:29 AM

## 2016-01-08 ENCOUNTER — Ambulatory Visit: Payer: Medicare Other | Admitting: Family

## 2016-01-11 LAB — CULTURE, BLOOD (ROUTINE X 2)
Culture: NO GROWTH
Culture: NO GROWTH

## 2016-01-14 ENCOUNTER — Emergency Department
Admission: EM | Admit: 2016-01-14 | Discharge: 2016-01-14 | Disposition: A | Discharge status: Home / Self Care | Payer: Medicare Other | Attending: Emergency Medicine | Admitting: Emergency Medicine

## 2016-01-14 ENCOUNTER — Encounter: Payer: Self-pay | Admitting: Emergency Medicine

## 2016-01-14 ENCOUNTER — Emergency Department: Payer: Medicare Other

## 2016-01-14 DIAGNOSIS — I132 Hypertensive heart and chronic kidney disease with heart failure and with stage 5 chronic kidney disease, or end stage renal disease: Secondary | ICD-10-CM | POA: Diagnosis not present

## 2016-01-14 DIAGNOSIS — N186 End stage renal disease: Secondary | ICD-10-CM | POA: Diagnosis not present

## 2016-01-14 DIAGNOSIS — F17210 Nicotine dependence, cigarettes, uncomplicated: Secondary | ICD-10-CM | POA: Diagnosis not present

## 2016-01-14 DIAGNOSIS — R0602 Shortness of breath: Secondary | ICD-10-CM | POA: Insufficient documentation

## 2016-01-14 DIAGNOSIS — Z992 Dependence on renal dialysis: Secondary | ICD-10-CM | POA: Diagnosis not present

## 2016-01-14 DIAGNOSIS — Z79899 Other long term (current) drug therapy: Secondary | ICD-10-CM | POA: Insufficient documentation

## 2016-01-14 DIAGNOSIS — I509 Heart failure, unspecified: Secondary | ICD-10-CM | POA: Diagnosis not present

## 2016-01-14 LAB — CBC WITH DIFFERENTIAL/PLATELET
Basophils Absolute: 0.1 10*3/uL (ref 0–0.1)
Basophils Relative: 1 %
EOS ABS: 0.1 10*3/uL (ref 0–0.7)
Eosinophils Relative: 1 %
HCT: 31.1 % — ABNORMAL LOW (ref 40.0–52.0)
HEMOGLOBIN: 10.4 g/dL — AB (ref 13.0–18.0)
LYMPHS ABS: 0.9 10*3/uL — AB (ref 1.0–3.6)
LYMPHS PCT: 16 %
MCH: 32.3 pg (ref 26.0–34.0)
MCHC: 33.4 g/dL (ref 32.0–36.0)
MCV: 96.5 fL (ref 80.0–100.0)
Monocytes Absolute: 0.8 10*3/uL (ref 0.2–1.0)
Monocytes Relative: 14 %
NEUTROS ABS: 3.7 10*3/uL (ref 1.4–6.5)
NEUTROS PCT: 68 %
Platelets: 196 10*3/uL (ref 150–440)
RBC: 3.23 MIL/uL — AB (ref 4.40–5.90)
RDW: 17.4 % — ABNORMAL HIGH (ref 11.5–14.5)
WBC: 5.6 10*3/uL (ref 3.8–10.6)

## 2016-01-14 LAB — BASIC METABOLIC PANEL
Anion gap: 13 (ref 5–15)
BUN: 41 mg/dL — AB (ref 6–20)
CHLORIDE: 101 mmol/L (ref 101–111)
CO2: 22 mmol/L (ref 22–32)
Calcium: 7.9 mg/dL — ABNORMAL LOW (ref 8.9–10.3)
Creatinine, Ser: 7.82 mg/dL — ABNORMAL HIGH (ref 0.61–1.24)
GFR calc Af Amer: 8 mL/min — ABNORMAL LOW (ref >60)
GFR calc non Af Amer: 7 mL/min — ABNORMAL LOW (ref >60)
Glucose, Bld: 79 mg/dL (ref 65–99)
POTASSIUM: 5.4 mmol/L — AB (ref 3.5–5.1)
SODIUM: 136 mmol/L (ref 135–145)

## 2016-01-14 LAB — BRAIN NATRIURETIC PEPTIDE: B Natriuretic Peptide: >4500 pg/mL — ABNORMAL HIGH (ref 0.0–100.0)

## 2016-01-14 LAB — TROPONIN I
TROPONIN I: 0.11 ng/mL — AB (ref <0.03)
Troponin I: 0.1 ng/mL (ref <0.03)

## 2016-01-14 MED ORDER — IPRATROPIUM-ALBUTEROL 0.5-2.5 (3) MG/3ML IN SOLN
3.0000 mL | Freq: Once | RESPIRATORY_TRACT | Status: AC
  Administered 2016-01-14: 3 mL via RESPIRATORY_TRACT
  Filled 2016-01-14: qty 3

## 2016-01-14 NOTE — ED Notes (Addendum)
ED Provider at bedside. 

## 2016-01-14 NOTE — Progress Notes (Signed)
Subjective:  Patient is known to our practice from outpatient dialysis. He presents to the emergency room for complaints of shortness of breath. He went to his regular dialysis treatment yesterday. Presents from home via EMS Chest x-ray shows pulmonary venous congestion. Oxygen saturation is 98-99% on room air   Objective:  Vital signs in last 24 hours:  Temp:  [97.8 F (36.6 C)] 97.8 F (36.6 C) (11/22 0711) Pulse Rate:  [47-86] 85 (11/22 1030) Resp:  [11-31] 31 (11/22 1030) BP: (152-160)/(96-119) 155/107 (11/22 1030) SpO2:  [92 %-100 %] 98 % (11/22 1030) Weight:  [77.2 kg (170 lb 4.8 oz)] 77.2 kg (170 lb 4.8 oz) (11/22 0655)  Weight change:  Filed Weights   01/14/16 0655  Weight: 77.2 kg (170 lb 4.8 oz)    Intake/Output:   No intake or output data in the 24 hours ending 01/14/16 1202   Physical Exam: General: No acute distress, lying in the bed   HEENT Anicteric, moist oral mucous membranes   Neck Supple   Pulm/lungs Room air, bibasilar crackles, normal breathing effort   CVS/Heart Regular, no rub   Abdomen:  Soft, nontender   Extremities: No peripheral edema   Neurologic: Alert, oriented   Skin: No acute rashes   Access: AV fistula        Basic Metabolic Panel:   Recent Labs Lab 01/14/16 0657  NA 136  K 5.4*  CL 101  CO2 22  GLUCOSE 79  BUN 41*  CREATININE 7.82*  CALCIUM 7.9*     CBC:  Recent Labs Lab 01/14/16 0657  WBC 5.6  NEUTROABS 3.7  HGB 10.4*  HCT 31.1*  MCV 96.5  PLT 196      Microbiology:  Recent Results (from the past 720 hour(s))  Blood culture (routine x 2)     Status: None   Collection Time: 01/06/16 10:42 PM  Result Value Ref Range Status   Specimen Description BLOOD  RIGHT AC  Final   Special Requests   Final    BOTTLES DRAWN AEROBIC AND ANAEROBIC  ANA 3ML AER 6ML   Culture NO GROWTH 5 DAYS  Final   Report Status 01/11/2016 FINAL  Final  Blood culture (routine x 2)     Status: None   Collection Time: 01/06/16  10:42 PM  Result Value Ref Range Status   Specimen Description BLOOD RIGHT FOREARM  Final   Special Requests   Final    BOTTLES DRAWN AEROBIC AND ANAEROBIC  ANA 3 ML AER 5 ML   Culture NO GROWTH 5 DAYS  Final   Report Status 01/11/2016 FINAL  Final  MRSA PCR Screening     Status: None   Collection Time: 01/07/16  3:38 AM  Result Value Ref Range Status   MRSA by PCR NEGATIVE NEGATIVE Final    Comment:        The GeneXpert MRSA Assay (FDA approved for NASAL specimens only), is one component of a comprehensive MRSA colonization surveillance program. It is not intended to diagnose MRSA infection nor to guide or monitor treatment for MRSA infections.     Coagulation Studies: No results for input(s): LABPROT, INR in the last 72 hours.  Urinalysis: No results for input(s): COLORURINE, LABSPEC, PHURINE, GLUCOSEU, HGBUR, BILIRUBINUR, KETONESUR, PROTEINUR, UROBILINOGEN, NITRITE, LEUKOCYTESUR in the last 72 hours.  Invalid input(s): APPERANCEUR    Imaging: Dg Chest 2 View  Result Date: 01/14/2016 CLINICAL DATA:  Shortness of Breath starting yesterday. Tobacco use. EXAM: CHEST  2 VIEW COMPARISON:  01/06/2016 FINDINGS: Mild to moderate enlargement of the cardiopericardial silhouette with mild cephalization of blood flow but without overt edema. No Kerley B-lines. Atherosclerotic calcification of the aortic arch. Airway thickening is present, suggesting bronchitis or reactive airways disease. Mild thoracic spondylosis. No pleural effusion. Stable ring density projecting over the anterior mediastinum on the lateral projection compared back through 2015, likely a calcified benign structure. IMPRESSION: 1. Mild-to-moderate cardiomegaly with pulmonary venous hypertension but no overt edema. 2. Airway thickening is present, suggesting bronchitis or reactive airways disease. Electronically Signed   By: Van Clines M.D.   On: 01/14/2016 08:00     Medications:       Assessment/ Plan:   60 y.o. male with hypertension, hepatitis C, ESRD started HD 10/13, anemia of CKD, admission for diarrhea/hyperkalemia 10/2012, admission for GI bleed 10/2012, SHPTH, GI bleed 3/15, seizure episode, admission for pulmonary edema 9/16.  1.  Acute Pulmonary edema. - Patient had his complete dialysis treatment yesterday as outpatient. Today, he still feels short of breath. Chest x-ray shows congestive changes. BNP is high. However, patient is currently on room air, saturations 98-99%. Hemoglobin is 10.4. Troponin is mildly elevated at 0.11. A  repeat troponin level is pending. We contacted outpatient dialysis center. They are able to accomodate him this afternoon. Emergency room staff will arrange transportation for the patient to get to the dialysis unit.  2.     end-stage renal disease [facility], [place], [place] Continue outpatient dialysis as per his regular schedule  3. HTN - Blood pressure is elevated to 155/107. Expected to improve with dialysis treatment and fluid removal     LOS: 0 Jalicia Roszak 11/22/201712:02 PM

## 2016-01-14 NOTE — ED Triage Notes (Signed)
Patient called out from home c/o SOB.  EMS states patient VS are WNL with sats at 98 on room air.  Patient in NAD during triage but he does get winded during exertion.  No other complaints or pains.  Patient is on dialysis and had full treatment yesterday.

## 2016-01-14 NOTE — ED Notes (Signed)
Troponin 0.11 reported to Dr Alfred Levins

## 2016-01-14 NOTE — ED Notes (Signed)
Pt sitting up eating meal.

## 2016-01-14 NOTE — ED Notes (Signed)
Patient called out stating that he wants to be transferred to [facility]. Advised patient that I did not know his case/reason for being in hospital as I was not his nurse, but that I would advise his nurse of his wishes. RN notified.

## 2016-01-14 NOTE — ED Provider Notes (Signed)
[facility] Emergency Department Provider Note  ____________________________________________  Time seen: Approximately 7:00 AM  I have reviewed the triage vital signs and the nursing notes.   HISTORY  Chief Complaint Shortness of Breath   HPI [name] is a 60 y.o. male h/o ESRD on HD (MWF), last HD yesterday, HTN, PVD, active smoker since for evaluation of shortness of breath. Patient reports that he underwent a full dialysis treatment yesterday. Yesterday evening he started feeling more short of breath and was unable to sleep throughout the night due to the shortness of breath. He feels like he was not dialyzed correctly and feels volume overloaded. He's also been having a cough productive of white sputum for the course of the last week. No fever or chills, no chest pain, no N/V. He reports that this morning the shortness of breath became severe which prompted him to call EMS. When EMS arrived patient had normal vital signs and satting 98% on room air. Endorses compliance with his medications.  Past Medical History:  Diagnosis Date  . ESRD (end stage renal disease) ([place])   . HTN (hypertension) 08/02/2015  . Peripheral vascular disease [facility])     Patient Active Problem List   Diagnosis Date Noted  . Community acquired pneumonia 01/07/2016  . Renal dialysis device, implant, or graft complication 54/65/0354  . Tobacco use 09/04/2015  . ESRD (end stage renal disease) ([place]) 08/02/2015  . Fluid overload 08/02/2015  . HTN (hypertension) 08/02/2015  . CHF (congestive heart failure) ([place]) 11/10/2014    Past Surgical History:  Procedure Laterality Date  . AV FISTULA PLACEMENT Left 2014  . PERIPHERAL VASCULAR CATHETERIZATION N/A 10/03/2014   Procedure: A/V Shuntogram/Fistulagram;  Surgeon: Algernon Huxley, MD;  Location: [place] [facility];  Service: Cardiovascular;  Laterality: N/A;  . PERIPHERAL VASCULAR CATHETERIZATION Left 10/03/2014   Procedure: A/V Shunt  Intervention;  Surgeon: Algernon Huxley, MD;  Location: [place] [facility];  Service: Cardiovascular;  Laterality: Left;  . PERIPHERAL VASCULAR CATHETERIZATION Left 05/01/2015   Procedure: A/V Shuntogram/Fistulagram;  Surgeon: Algernon Huxley, MD;  Location: [place] [facility];  Service: Cardiovascular;  Laterality: Left;  . PERIPHERAL VASCULAR CATHETERIZATION N/A 05/01/2015   Procedure: A/V Shunt Intervention;  Surgeon: Algernon Huxley, MD;  Location: [place] [facility];  Service: Cardiovascular;  Laterality: N/A;    Prior to Admission medications   Medication Sig Start Date End Date Taking? Authorizing Provider  amLODipine (NORVASC) 10 MG tablet Take 10 mg by mouth daily.   Yes Historical Provider, MD  calcium acetate (PHOSLO) 667 MG capsule Take 1,334-2,001 mg by mouth 3 (three) times daily with meals. 12/11/15  Yes Historical Provider, MD  cinacalcet (SENSIPAR) 90 MG tablet Take 90 mg by mouth daily.   Yes Historical Provider, MD  hydrALAZINE (APRESOLINE) 25 MG tablet Take 25 mg by mouth 3 (three) times daily.   Yes Historical Provider, MD  irbesartan (AVAPRO) 300 MG tablet Take 1 tablet (300 mg total) by mouth every evening. 08/03/15  Yes Sital Mody, MD  loratadine (CLARITIN) 10 MG tablet Take 10 mg by mouth daily as needed for itching.    Yes Historical Provider, MD  Calcium Carbonate Antacid (TUMS ULTRA 1000 PO) Take 2 tablets by mouth 3 (three) times daily before meals.    Historical Provider, MD    Allergies Sulfa antibiotics  Family History  Problem Relation Age of Onset  . Hypertension Son   . Diabetes Son   . Hypertension Mother   .  Diabetes Sister     Social History Social History  Substance Use Topics  . Smoking status: Current Some Day Smoker    Packs/day: 0.25    Years: 40.00    Types: Cigarettes  . Smokeless tobacco: Never Used  . Alcohol use No    Review of Systems  Constitutional: Negative for fever. Eyes: Negative for visual changes. ENT: Negative for sore  throat. Neck: No neck pain  Cardiovascular: Negative for chest pain. Respiratory: + shortness of breath. Gastrointestinal: Negative for abdominal pain, vomiting or diarrhea. Genitourinary: Negative for dysuria. Musculoskeletal: Negative for back pain. Skin: Negative for rash. Neurological: Negative for headaches, weakness or numbness. Psych: No SI or HI  ____________________________________________   PHYSICAL EXAM:  VITAL SIGNS: Vitals:   01/14/16 1015 01/14/16 1030  BP:  (!) 155/107  Pulse: 81 85  Resp: 18 (!) 31  Temp:     Constitutional: Alert and oriented. Well appearing and in no apparent distress. HEENT:      Head: Normocephalic and atraumatic.         Eyes: Conjunctivae are normal. Sclera is non-icteric. EOMI. PERRL      Mouth/Throat: Mucous membranes are moist.       Neck: Supple with no signs of meningismus. Cardiovascular: Regular rate and rhythm. No murmurs, gallops, or rubs. 2+ symmetrical distal pulses are present in all extremities. No JVD. Respiratory: Normal respiratory effort. Dyspneic, speaking in full sentences. Sating well on RA. Moving good air with no wheezing or crackles Gastrointestinal: Soft, non tender, and non distended with positive bowel sounds. No rebound or guarding. Musculoskeletal: Trace b/l pitting edema Neurologic: Normal speech and language. Face is symmetric. Moving all extremities. No gross focal neurologic deficits are appreciated. Skin: Skin is warm, dry and intact. No rash noted. Psychiatric: Mood and affect are normal. Speech and behavior are normal.  ____________________________________________   LABS (all labs ordered are listed, but only abnormal results are displayed)  Labs Reviewed  CBC WITH DIFFERENTIAL/PLATELET - Abnormal; Notable for the following:       Result Value   RBC 3.23 (*)    Hemoglobin 10.4 (*)    HCT 31.1 (*)    RDW 17.4 (*)    Lymphs Abs 0.9 (*)    All other components within normal limits  BASIC  METABOLIC PANEL - Abnormal; Notable for the following:    Potassium 5.4 (*)    BUN 41 (*)    Creatinine, Ser 7.82 (*)    Calcium 7.9 (*)    GFR calc non Af Amer 7 (*)    GFR calc Af Amer 8 (*)    All other components within normal limits  TROPONIN I - Abnormal; Notable for the following:    Troponin I 0.11 (*)    All other components within normal limits  BRAIN NATRIURETIC PEPTIDE - Abnormal; Notable for the following:    B Natriuretic Peptide >4,500.0 (*)    All other components within normal limits  TROPONIN I - Abnormal; Notable for the following:    Troponin I 0.10 (*)    All other components within normal limits   ____________________________________________  EKG  ED ECG REPORT I, Rudene Re, the attending physician, personally viewed and interpreted this ECG.  Normal sinus rhythm, rate of 85, normal intervals, normal axis, no ST elevations or depressions, T-wave inversions in inferior and lateral leads which are unchanged from prior. ____________________________________________  RADIOLOGY  CXR:    CLINICAL DATA: Shortness of Breath starting yesterday. Tobacco use.  EXAM: CHEST 2 VIEW  COMPARISON: 01/06/2016  FINDINGS: Mild to moderate enlargement of the cardiopericardial silhouette with mild cephalization of blood flow but without overt edema. No Kerley B-lines. Atherosclerotic calcification of the aortic arch.  Airway thickening is present, suggesting bronchitis or reactive airways disease. Mild thoracic spondylosis. No pleural effusion. Stable ring density projecting over the anterior mediastinum on the lateral projection compared back through 2015, likely a calcified benign structure.  IMPRESSION: 1. Mild-to-moderate cardiomegaly with pulmonary venous hypertension but no overt edema. 2. Airway thickening is present, suggesting bronchitis or reactive airways disease.       ____________________________________________   PROCEDURES  Procedure(s) performed: None Procedures Critical Care performed:  None ____________________________________________   INITIAL IMPRESSION / ASSESSMENT AND PLAN / ED COURSE   60 y.o. male h/o ESRD on HD (MWF), last HD yesterday, HTN, PVD, active smoker since for evaluation of shortness of breath since last night. Patient is mildly dyspneic but keeping his sats on room air, moving good air and no wheezing or crackels. Patient has no diagnosis of COPD probably concerning for pulmonary edema. Plan for chest x-ray, labs, monitor patient on cardiac telemetry. EKG with no evidence of ischemia.  Clinical Course as of Jan 14 1231  Wed Jan 14, 2016  [id number] No improvement with duoneb although patient is moving better air. No wheezing. Spoke with Dr. Candiss Norse who will dialyze patient today due to mild pulmonary congestion, pitting edema, and hyperkalemia. Troponin is mildly elevated which is patient's baseline. Will get a 2nd set and if stable will dialyze and dc from the ED, otherwise will admit.  [CV]    Clinical Course User Index [CV] Rudene Re, MD    _________________________ 12:31 PM on 01/14/2016 ----------------------------------------- Dr. Candiss Norse has contacted patient's HD center who will see patient after he is dc from the ED for dialysis today. Patient second troponin remains at his baseline. Patient remains stable with no wheezing, no oxygen requirement, with normal WOB. Patient will be dc to HD center at this time.    Pertinent labs & imaging results that were available during my care of the patient were reviewed by me and considered in my medical decision making (see chart for details).    ____________________________________________   FINAL CLINICAL IMPRESSION(S) / ED DIAGNOSES  Final diagnoses:  SOB (shortness of breath)      NEW MEDICATIONS STARTED DURING THIS VISIT:  New Prescriptions   No  medications on file     Note:  This document was prepared using Dragon voice recognition software and may include unintentional dictation errors.    Rudene Re, MD 01/14/16 1233

## 2016-02-05 ENCOUNTER — Ambulatory Visit: Payer: Medicare Other | Admitting: Family

## 2016-02-26 ENCOUNTER — Emergency Department: Payer: Medicare Other

## 2016-02-26 ENCOUNTER — Encounter: Payer: Self-pay | Admitting: Intensive Care

## 2016-02-26 ENCOUNTER — Inpatient Hospital Stay
Admission: EM | Admit: 2016-02-26 | Discharge: 2016-02-28 | DRG: 871 | Disposition: A | Discharge status: Home / Self Care | Payer: Medicare Other | Attending: Internal Medicine | Admitting: Internal Medicine

## 2016-02-26 DIAGNOSIS — D631 Anemia in chronic kidney disease: Secondary | ICD-10-CM | POA: Diagnosis present

## 2016-02-26 DIAGNOSIS — E1151 Type 2 diabetes mellitus with diabetic peripheral angiopathy without gangrene: Secondary | ICD-10-CM | POA: Diagnosis present

## 2016-02-26 DIAGNOSIS — E1122 Type 2 diabetes mellitus with diabetic chronic kidney disease: Secondary | ICD-10-CM | POA: Diagnosis present

## 2016-02-26 DIAGNOSIS — R0789 Other chest pain: Secondary | ICD-10-CM

## 2016-02-26 DIAGNOSIS — I132 Hypertensive heart and chronic kidney disease with heart failure and with stage 5 chronic kidney disease, or end stage renal disease: Secondary | ICD-10-CM | POA: Diagnosis present

## 2016-02-26 DIAGNOSIS — Z833 Family history of diabetes mellitus: Secondary | ICD-10-CM

## 2016-02-26 DIAGNOSIS — R778 Other specified abnormalities of plasma proteins: Secondary | ICD-10-CM

## 2016-02-26 DIAGNOSIS — Z992 Dependence on renal dialysis: Secondary | ICD-10-CM | POA: Diagnosis not present

## 2016-02-26 DIAGNOSIS — Z8249 Family history of ischemic heart disease and other diseases of the circulatory system: Secondary | ICD-10-CM

## 2016-02-26 DIAGNOSIS — A419 Sepsis, unspecified organism: Principal | ICD-10-CM | POA: Diagnosis present

## 2016-02-26 DIAGNOSIS — F17210 Nicotine dependence, cigarettes, uncomplicated: Secondary | ICD-10-CM | POA: Diagnosis present

## 2016-02-26 DIAGNOSIS — E875 Hyperkalemia: Secondary | ICD-10-CM | POA: Diagnosis present

## 2016-02-26 DIAGNOSIS — I5042 Chronic combined systolic (congestive) and diastolic (congestive) heart failure: Secondary | ICD-10-CM | POA: Diagnosis present

## 2016-02-26 DIAGNOSIS — R7989 Other specified abnormal findings of blood chemistry: Secondary | ICD-10-CM

## 2016-02-26 DIAGNOSIS — E119 Type 2 diabetes mellitus without complications: Secondary | ICD-10-CM

## 2016-02-26 DIAGNOSIS — Z882 Allergy status to sulfonamides status: Secondary | ICD-10-CM

## 2016-02-26 DIAGNOSIS — Y95 Nosocomial condition: Secondary | ICD-10-CM | POA: Diagnosis present

## 2016-02-26 DIAGNOSIS — B1920 Unspecified viral hepatitis C without hepatic coma: Secondary | ICD-10-CM | POA: Diagnosis present

## 2016-02-26 DIAGNOSIS — N186 End stage renal disease: Secondary | ICD-10-CM | POA: Diagnosis present

## 2016-02-26 DIAGNOSIS — Z79899 Other long term (current) drug therapy: Secondary | ICD-10-CM | POA: Diagnosis not present

## 2016-02-26 DIAGNOSIS — J181 Lobar pneumonia, unspecified organism: Secondary | ICD-10-CM

## 2016-02-26 DIAGNOSIS — J189 Pneumonia, unspecified organism: Secondary | ICD-10-CM | POA: Diagnosis present

## 2016-02-26 DIAGNOSIS — I10 Essential (primary) hypertension: Secondary | ICD-10-CM | POA: Diagnosis present

## 2016-02-26 LAB — GLUCOSE, CAPILLARY: GLUCOSE-CAPILLARY: 169 mg/dL — AB (ref 65–99)

## 2016-02-26 LAB — BASIC METABOLIC PANEL
Anion gap: 12 (ref 5–15)
BUN: 51 mg/dL — AB (ref 6–20)
CHLORIDE: 102 mmol/L (ref 101–111)
CO2: 23 mmol/L (ref 22–32)
CREATININE: 8.74 mg/dL — AB (ref 0.61–1.24)
Calcium: 8.3 mg/dL — ABNORMAL LOW (ref 8.9–10.3)
GFR calc Af Amer: 7 mL/min — ABNORMAL LOW (ref >60)
GFR calc non Af Amer: 6 mL/min — ABNORMAL LOW (ref >60)
Glucose, Bld: 93 mg/dL (ref 65–99)
POTASSIUM: 5 mmol/L (ref 3.5–5.1)
Sodium: 137 mmol/L (ref 135–145)

## 2016-02-26 LAB — CBC
HEMATOCRIT: 33 % — AB (ref 40.0–52.0)
Hemoglobin: 10.9 g/dL — ABNORMAL LOW (ref 13.0–18.0)
MCH: 30.3 pg (ref 26.0–34.0)
MCHC: 33 g/dL (ref 32.0–36.0)
MCV: 91.7 fL (ref 80.0–100.0)
Platelets: 259 10*3/uL (ref 150–440)
RBC: 3.59 MIL/uL — ABNORMAL LOW (ref 4.40–5.90)
RDW: 16.9 % — AB (ref 11.5–14.5)
WBC: 11.1 10*3/uL — ABNORMAL HIGH (ref 3.8–10.6)

## 2016-02-26 LAB — RAPID INFLUENZA A&B ANTIGENS
Influenza A (ARMC): NEGATIVE
Influenza B (ARMC): NEGATIVE

## 2016-02-26 LAB — LACTIC ACID, PLASMA: Lactic Acid, Venous: 0.9 mmol/L (ref 0.5–1.9)

## 2016-02-26 LAB — TROPONIN I: Troponin I: 0.12 ng/mL (ref <0.03)

## 2016-02-26 MED ORDER — ONDANSETRON HCL 4 MG PO TABS: 4.0000 mg | ORAL_TABLET | Freq: Four times a day (QID) | ORAL | Status: DC | PRN

## 2016-02-26 MED ORDER — PIPERACILLIN-TAZOBACTAM 3.375 G IVPB 30 MIN
3.3750 g | Freq: Once | INTRAVENOUS | Status: AC
  Administered 2016-02-26: 3.375 g via INTRAVENOUS
  Filled 2016-02-26: qty 50

## 2016-02-26 MED ORDER — IPRATROPIUM-ALBUTEROL 0.5-2.5 (3) MG/3ML IN SOLN
3.0000 mL | RESPIRATORY_TRACT | Status: DC | PRN
  Administered 2016-02-27: 3 mL via RESPIRATORY_TRACT
  Filled 2016-02-26: qty 3

## 2016-02-26 MED ORDER — AMLODIPINE BESYLATE 10 MG PO TABS
10.0000 mg | ORAL_TABLET | Freq: Every day | ORAL | Status: DC
  Administered 2016-02-27 – 2016-02-28 (×2): 10 mg via ORAL
  Filled 2016-02-26 (×2): qty 1

## 2016-02-26 MED ORDER — ONDANSETRON HCL 4 MG/2ML IJ SOLN: 4.0000 mg | Freq: Four times a day (QID) | INTRAMUSCULAR | Status: DC | PRN

## 2016-02-26 MED ORDER — INSULIN ASPART 100 UNIT/ML ~~LOC~~ SOLN
0.0000 [IU] | Freq: Three times a day (TID) | SUBCUTANEOUS | Status: DC
  Administered 2016-02-27: 1 [IU] via SUBCUTANEOUS
  Filled 2016-02-26: qty 1

## 2016-02-26 MED ORDER — ACETAMINOPHEN 325 MG PO TABS
650.0000 mg | ORAL_TABLET | Freq: Four times a day (QID) | ORAL | Status: DC | PRN
  Administered 2016-02-27 – 2016-02-28 (×2): 650 mg via ORAL
  Filled 2016-02-26 (×2): qty 2

## 2016-02-26 MED ORDER — ACETAMINOPHEN 650 MG RE SUPP
650.0000 mg | Freq: Four times a day (QID) | RECTAL | Status: DC | PRN
Start: 2016-02-26 — End: 2016-02-28

## 2016-02-26 MED ORDER — IRBESARTAN 75 MG PO TABS
300.0000 mg | ORAL_TABLET | Freq: Every evening | ORAL | Status: DC
  Administered 2016-02-26 – 2016-02-27 (×2): 300 mg via ORAL
  Filled 2016-02-26 (×2): qty 4

## 2016-02-26 MED ORDER — ACETAMINOPHEN 500 MG PO TABS
1000.0000 mg | ORAL_TABLET | Freq: Once | ORAL | Status: AC
  Administered 2016-02-26: 1000 mg via ORAL
  Filled 2016-02-26: qty 2

## 2016-02-26 MED ORDER — CINACALCET HCL 30 MG PO TABS
90.0000 mg | ORAL_TABLET | Freq: Every day | ORAL | Status: DC
  Administered 2016-02-27 – 2016-02-28 (×2): 90 mg via ORAL
  Filled 2016-02-26 (×3): qty 3

## 2016-02-26 MED ORDER — CALCIUM CARBONATE ANTACID 500 MG PO CHEW
2.0000 | CHEWABLE_TABLET | Freq: Three times a day (TID) | ORAL | Status: DC
  Administered 2016-02-27 – 2016-02-28 (×5): 400 mg via ORAL
  Filled 2016-02-26 (×5): qty 2

## 2016-02-26 MED ORDER — CALCIUM ACETATE (PHOS BINDER) 667 MG PO CAPS
2001.0000 mg | ORAL_CAPSULE | Freq: Three times a day (TID) | ORAL | Status: DC
  Administered 2016-02-27 – 2016-02-28 (×5): 2001 mg via ORAL
  Filled 2016-02-26 (×6): qty 3

## 2016-02-26 MED ORDER — INSULIN ASPART 100 UNIT/ML ~~LOC~~ SOLN: 0.0000 [IU] | Freq: Every day | SUBCUTANEOUS | Status: DC

## 2016-02-26 MED ORDER — ASPIRIN 81 MG PO CHEW
324.0000 mg | CHEWABLE_TABLET | Freq: Once | ORAL | Status: AC
  Administered 2016-02-26: 324 mg via ORAL
  Filled 2016-02-26: qty 4

## 2016-02-26 MED ORDER — HYDRALAZINE HCL 25 MG PO TABS
25.0000 mg | ORAL_TABLET | Freq: Three times a day (TID) | ORAL | Status: DC
  Administered 2016-02-26 – 2016-02-28 (×4): 25 mg via ORAL
  Filled 2016-02-26 (×5): qty 1

## 2016-02-26 MED ORDER — HEPARIN SODIUM (PORCINE) 5000 UNIT/ML IJ SOLN
5000.0000 [IU] | Freq: Three times a day (TID) | INTRAMUSCULAR | Status: DC
  Administered 2016-02-26 – 2016-02-28 (×6): 5000 [IU] via SUBCUTANEOUS
  Filled 2016-02-26 (×6): qty 1

## 2016-02-26 MED ORDER — VANCOMYCIN HCL IN DEXTROSE 1-5 GM/200ML-% IV SOLN
1000.0000 mg | Freq: Once | INTRAVENOUS | Status: AC
  Administered 2016-02-26: 1000 mg via INTRAVENOUS
  Filled 2016-02-26: qty 200

## 2016-02-26 NOTE — ED Notes (Signed)
Hooked pt up to monitor.

## 2016-02-26 NOTE — ED Provider Notes (Signed)
[facility] Emergency Department Provider Note ____________________________________________   I have reviewed the triage vital signs and the triage nursing note.  HISTORY  Chief Complaint Chest Pain and Shortness of Breath   Historian Patient   HPI [name] is a 61 y.o. male with history of end-stage renal disease, on dialysis, diabetes, and a history of communicable acquired pneumonia admitted in the hospital in November, presents today with what he reports his cough since this morning. He was found here to be febrile to 102. He states that he feels body aches all over. He's had some chest discomfort on the left side especially with the cough. He does not use inhalers at home.  Symptoms are moderate. Nothing makes it worse or better.    Past Medical History:  Diagnosis Date  . Diabetes mellitus without complication ([place])   . ESRD (end stage renal disease) ([place])   . HTN (hypertension) 08/02/2015  . Peripheral vascular disease [facility])     Patient Active Problem List   Diagnosis Date Noted  . Community acquired pneumonia 01/07/2016  . Renal dialysis device, implant, or graft complication 01/75/1025  . Tobacco use 09/04/2015  . ESRD (end stage renal disease) ([place]) 08/02/2015  . Fluid overload 08/02/2015  . HTN (hypertension) 08/02/2015  . CHF (congestive heart failure) ([place]) 11/10/2014    Past Surgical History:  Procedure Laterality Date  . AV FISTULA PLACEMENT Left 2014  . PERIPHERAL VASCULAR CATHETERIZATION N/A 10/03/2014   Procedure: A/V Shuntogram/Fistulagram;  Surgeon: Algernon Huxley, MD;  Location: [place] CV LAB;  Service: Cardiovascular;  Laterality: N/A;  . PERIPHERAL VASCULAR CATHETERIZATION Left 10/03/2014   Procedure: A/V Shunt Intervention;  Surgeon: Algernon Huxley, MD;  Location: [place] CV LAB;  Service: Cardiovascular;  Laterality: Left;  . PERIPHERAL VASCULAR CATHETERIZATION Left 05/01/2015   Procedure: A/V  Shuntogram/Fistulagram;  Surgeon: Algernon Huxley, MD;  Location: [place] CV LAB;  Service: Cardiovascular;  Laterality: Left;  . PERIPHERAL VASCULAR CATHETERIZATION N/A 05/01/2015   Procedure: A/V Shunt Intervention;  Surgeon: Algernon Huxley, MD;  Location: [place] CV LAB;  Service: Cardiovascular;  Laterality: N/A;    Prior to Admission medications   Medication Sig Start Date End Date Taking? Authorizing Provider  amLODipine (NORVASC) 10 MG tablet Take 10 mg by mouth daily.   Yes Historical Provider, MD  calcium acetate (PHOSLO) 667 MG capsule Take 1,334-2,001 mg by mouth 3 (three) times daily with meals. 12/11/15  Yes Historical Provider, MD  cinacalcet (SENSIPAR) 90 MG tablet Take 90 mg by mouth daily.   Yes Historical Provider, MD  hydrALAZINE (APRESOLINE) 25 MG tablet Take 25 mg by mouth 3 (three) times daily.   Yes Historical Provider, MD  irbesartan (AVAPRO) 300 MG tablet Take 1 tablet (300 mg total) by mouth every evening. 08/03/15  Yes Bettey Costa, MD  Calcium Carbonate Antacid (TUMS ULTRA 1000 PO) Take 2 tablets by mouth 3 (three) times daily before meals.    Historical Provider, MD  loratadine (CLARITIN) 10 MG tablet Take 10 mg by mouth daily as needed for itching.     Historical Provider, MD    Allergies  Allergen Reactions  . Sulfa Antibiotics Rash    Family History  Problem Relation Age of Onset  . Hypertension Son   . Diabetes Son   . Hypertension Mother   . Diabetes Sister     Social History Social History  Substance Use Topics  . Smoking status: Current Some Day Smoker  Packs/day: 0.25    Years: 40.00    Types: Cigarettes  . Smokeless tobacco: Never Used  . Alcohol use No    Review of Systems  Constitutional: Positive for fever. Eyes: Negative for visual changes. ENT: Negative for sore throat. Cardiovascular: Positive for chest pain. Respiratory: Positive for shortness of breath. Gastrointestinal: Negative for abdominal pain, vomiting and  diarrhea. Genitourinary: Negative for dysuria. Musculoskeletal: Negative for back pain. Skin: Negative for rash. Neurological: Negative for headache. 10 point Review of Systems otherwise negative ____________________________________________   PHYSICAL EXAM:  VITAL SIGNS: ED Triage Vitals  Enc Vitals Group     BP 02/26/16 1813 (!) 156/107     Pulse Rate 02/26/16 1813 91     Resp 02/26/16 1813 (!) 23     Temp 02/26/16 1813 (!) 102.3 F (39.1 C)     Temp Source 02/26/16 1813 Oral     SpO2 02/26/16 1813 96 %     Weight 02/26/16 1814 175 lb (79.4 kg)     Height 02/26/16 1814 5\' 8"  (1.727 m)     Head Circumference --      Peak Flow --      Pain Score 02/26/16 1814 5     Pain Loc --      Pain Edu? --      Excl. in [place]? --      Constitutional: Alert and oriented.  He doesn't feel that well, but in no distress. HEENT   Head: Normocephalic and atraumatic.      Eyes: Conjunctivae are normal. PERRL. Normal extraocular movements.      Ears:         Nose: No congestion/rhinnorhea.   Mouth/Throat: Mucous membranes are moist.   Neck: No stridor. Cardiovascular/Chest: Normal rate, regular rhythm.  No murmurs, rubs, or gallops. Respiratory: Normal respiratory effort without tachypnea nor retractions. Breath sounds are clear and equal bilaterally. Mild decreased breath sounds posteriorly with rhonchi posterior bilaterally. Gastrointestinal: Soft. No distention, no guarding, no rebound. Nontender.    Genitourinary/rectal:Deferred Musculoskeletal: Nontender with normal range of motion in all extremities. No joint effusions.  No lower extremity tenderness.  No edema. Neurologic:  Normal speech and language. No gross or focal neurologic deficits are appreciated. Skin:  Skin is warm, dry and intact. No rash noted. Psychiatric: Mood and affect are normal. Speech and behavior are normal. Patient exhibits appropriate insight and  judgment.   ____________________________________________  LABS (pertinent positives/negatives)  Labs Reviewed  BASIC METABOLIC PANEL - Abnormal; Notable for the following:       Result Value   BUN 51 (*)    Creatinine, Ser 8.74 (*)    Calcium 8.3 (*)    GFR calc non Af Amer 6 (*)    GFR calc Af Amer 7 (*)    All other components within normal limits  CBC - Abnormal; Notable for the following:    WBC 11.1 (*)    RBC 3.59 (*)    Hemoglobin 10.9 (*)    HCT 33.0 (*)    RDW 16.9 (*)    All other components within normal limits  TROPONIN I - Abnormal; Notable for the following:    Troponin I 0.12 (*)    All other components within normal limits  LACTIC ACID, PLASMA  LACTIC ACID, PLASMA  INFLUENZA PANEL BY PCR (TYPE A & B, H1N1)    ____________________________________________    EKG I, Lisa Roca, MD, the attending physician have personally viewed and interpreted all ECGs.  87 bpm.  Normal sinus rhythm with sinus arrhythmia. Normal axis. Nonspecific ST-T wave with some T-wave inversions laterally. ____________________________________________  RADIOLOGY All Xrays were viewed by me. Imaging interpreted by Radiologist.  Chest x-ray two-view:  IMPRESSION: Cardiomegaly. Confluent airspace opacity noted in the left lower lobe, best seen on the lateral view. Findings concerning for pneumonia.  __________________________________________  PROCEDURES  Procedure(s) performed: None  Critical Care performed: None  ____________________________________________   ED COURSE / ASSESSMENT AND PLAN  Pertinent labs & imaging results that were available during my care of the patient were reviewed by me and considered in my medical decision making (see chart for details).   [name] came in for left sided chest pain and shortness of breath, and was found be febrile and his chest x-ray shows left lower lobe pneumonia. He was recently admitted in the hospital in November, and so  we'll cover him for healthcare acquired pneumonia with stronger antibiotics, vancomycin and Zosyn. Fever plus infiltrate and dyspnea, but no hypotension or tachycardia at this point.  Given dialysis patient, concern for not causing fluid overload, will follow fluid resuscitation clinically.    In any case, given cardiopulmonary risk factors and healthcare acquired pneumonia, we will admit. His troponin was called critical at 0.12, but in view of previous troponins, his baseline appears to be between 0.09 and 0.13.  His EKG shows no new findings when compared to prior. I did go ahead and give him aspirin, but I am less suspicious for acute coronary syndrome.   CONSULTATIONS:  Hospitalist for admission.   Patient / Family / Caregiver informed of clinical course, medical decision-making process, and agree with plan.    ___________________________________________   FINAL CLINICAL IMPRESSION(S) / ED DIAGNOSES   Final diagnoses:  Pneumonia of left lower lobe due to infectious organism ([place])  Troponin level elevated  Atypical chest pain              Note: This dictation was prepared with Dragon dictation. Any transcriptional errors that result from this process are unintentional    Lisa Roca, MD 02/26/16 1907

## 2016-02-26 NOTE — H&P (Signed)
[facility] [place] NAME: Palmer Fahrner    MR#:  [id number]  DATE OF BIRTH:  [birth year]  DATE OF ADMISSION:  02/26/2016  PRIMARY CARE PHYSICIAN: [facility]   REQUESTING/REFERRING PHYSICIAN: Reita Cliche, MD  CHIEF COMPLAINT:   Chief Complaint  Patient presents with  . Chest Pain  . Shortness of Breath    HISTORY OF PRESENT ILLNESS:  Ishmeal Rorie  is a 61 y.o. male who presents with Fever and productive cough. Patient is very drowsy patient who was recently admitted to the hospital for a total course probiotics for pneumonia. He returns today after developing productive cough and fever for over the last 24 hours. Here his white blood cell count was elevated and chest x-ray was consistent with lobar consolidation concerning for pneumonia. Hospitalists were called for further evaluation and treatment  PAST MEDICAL HISTORY:   Past Medical History:  Diagnosis Date  . Diabetes mellitus without complication ([place])   . ESRD (end stage renal disease) ([place])   . HTN (hypertension) 08/02/2015  . Peripheral vascular disease ([place])     PAST SURGICAL HISTORY:   Past Surgical History:  Procedure Laterality Date  . AV FISTULA PLACEMENT Left 2014  . PERIPHERAL VASCULAR CATHETERIZATION N/A 10/03/2014   Procedure: A/V Shuntogram/Fistulagram;  Surgeon: Algernon Huxley, MD;  Location: [place] [facility] LAB;  Service: Cardiovascular;  Laterality: N/A;  . PERIPHERAL VASCULAR CATHETERIZATION Left 10/03/2014   Procedure: A/V Shunt Intervention;  Surgeon: Algernon Huxley, MD;  Location: [place] [facility];  Service: Cardiovascular;  Laterality: Left;  . PERIPHERAL VASCULAR CATHETERIZATION Left 05/01/2015   Procedure: A/V Shuntogram/Fistulagram;  Surgeon: Algernon Huxley, MD;  Location: [place] [facility];  Service: Cardiovascular;  Laterality: Left;  . PERIPHERAL VASCULAR CATHETERIZATION N/A 05/01/2015   Procedure: A/V Shunt Intervention;  Surgeon: Algernon Huxley,  MD;  Location: [place] [facility] LAB;  Service: Cardiovascular;  Laterality: N/A;    SOCIAL HISTORY:   Social History  Substance Use Topics  . Smoking status: Current Some Day Smoker    Packs/day: 0.25    Years: 40.00    Types: Cigarettes  . Smokeless tobacco: Never Used  . Alcohol use No    FAMILY HISTORY:   Family History  Problem Relation Age of Onset  . Hypertension Son   . Diabetes Son   . Hypertension Mother   . Diabetes Sister     DRUG ALLERGIES:   Allergies  Allergen Reactions  . Sulfa Antibiotics Rash    MEDICATIONS AT HOME:   Prior to Admission medications   Medication Sig Start Date End Date Taking? Authorizing Provider  amLODipine (NORVASC) 10 MG tablet Take 10 mg by mouth daily.   Yes Historical Provider, MD  calcium acetate (PHOSLO) 667 MG capsule Take 1,334-2,001 mg by mouth 3 (three) times daily with meals. 12/11/15  Yes Historical Provider, MD  cinacalcet (SENSIPAR) 90 MG tablet Take 90 mg by mouth daily.   Yes Historical Provider, MD  hydrALAZINE (APRESOLINE) 25 MG tablet Take 25 mg by mouth 3 (three) times daily.   Yes Historical Provider, MD  irbesartan (AVAPRO) 300 MG tablet Take 1 tablet (300 mg total) by mouth every evening. 08/03/15  Yes Bettey Costa, MD  Calcium Carbonate Antacid (TUMS ULTRA 1000 PO) Take 2 tablets by mouth 3 (three) times daily before meals.    Historical Provider, MD  loratadine (CLARITIN) 10 MG tablet Take 10 mg by mouth daily as needed for  itching.     Historical Provider, MD    REVIEW OF SYSTEMS:  Review of Systems  Constitutional: Positive for fever. Negative for chills, malaise/fatigue and weight loss.  HENT: Negative for ear pain, hearing loss and tinnitus.   Eyes: Negative for blurred vision, double vision, pain and redness.  Respiratory: Positive for cough, sputum production and shortness of breath. Negative for hemoptysis.   Cardiovascular: Negative for chest pain, palpitations, orthopnea and leg swelling.   Gastrointestinal: Negative for abdominal pain, constipation, diarrhea, nausea and vomiting.  Genitourinary: Negative for dysuria, frequency and hematuria.  Musculoskeletal: Negative for back pain, joint pain and neck pain.  Skin:       No acne, rash, or lesions  Neurological: Negative for dizziness, tremors, focal weakness and weakness.  Endo/Heme/Allergies: Negative for polydipsia. Does not bruise/bleed easily.  Psychiatric/Behavioral: Negative for depression. The patient is not nervous/anxious and does not have insomnia.      VITAL SIGNS:   Vitals:   02/26/16 1813 02/26/16 1814  BP: (!) 156/107   Pulse: 91   Resp: (!) 23   Temp: (!) 102.3 F (39.1 C)   TempSrc: Oral   SpO2: 96%   Weight:  79.4 kg (175 lb)  Height:  5\' 8"  (1.727 m)   Wt Readings from Last 3 Encounters:  02/26/16 79.4 kg (175 lb)  01/14/16 77.2 kg (170 lb 4.8 oz)  01/07/16 76.5 kg (168 lb 10.4 oz)    PHYSICAL EXAMINATION:  Physical Exam  Vitals reviewed. Constitutional: He is oriented to person, place, and time. He appears well-developed and well-nourished. No distress.  HENT:  Head: Normocephalic and atraumatic.  Mouth/Throat: Oropharynx is clear and moist.  Eyes: Conjunctivae and EOM are normal. Pupils are equal, round, and reactive to light. No scleral icterus.  Neck: Normal range of motion. Neck supple. No JVD present. No thyromegaly present.  Cardiovascular: Normal rate, regular rhythm and intact distal pulses.  Exam reveals no gallop and no friction rub.   No murmur heard. Respiratory: Effort normal. No respiratory distress. He has no wheezes. He has rales (Left lower lobe).  Left lower lobe rhonchi  GI: Soft. Bowel sounds are normal. He exhibits no distension. There is no tenderness.  Musculoskeletal: Normal range of motion. He exhibits no edema.  No arthritis, no gout  Lymphadenopathy:    He has no cervical adenopathy.  Neurological: He is alert and oriented to person, place, and time. No  cranial nerve deficit.  No dysarthria, no aphasia  Skin: Skin is warm and dry. No rash noted. No erythema.  Psychiatric: He has a normal mood and affect. His behavior is normal. Judgment and thought content normal.    LABORATORY PANEL:   CBC  Recent Labs Lab 02/26/16 1815  WBC 11.1*  HGB 10.9*  HCT 33.0*  PLT 259   ------------------------------------------------------------------------------------------------------------------  Chemistries   Recent Labs Lab 02/26/16 1815  NA 137  K 5.0  CL 102  CO2 23  GLUCOSE 93  BUN 51*  CREATININE 8.74*  CALCIUM 8.3*   ------------------------------------------------------------------------------------------------------------------  Cardiac Enzymes  Recent Labs Lab 02/26/16 1815  TROPONINI 0.12*   ------------------------------------------------------------------------------------------------------------------  RADIOLOGY:  Dg Chest 2 View  Result Date: 02/26/2016 CLINICAL DATA:  Chest pain, shortness of breath for 2 days EXAM: CHEST  2 VIEW COMPARISON:  01/14/2016 FINDINGS: Cardiomegaly. Focal airspace opacity noted inferiorly on the lateral view, likely in the left lower lobe concerning for pneumonia. No effusions. No confluent opacity on the right. No acute bony abnormality. IMPRESSION: Cardiomegaly.  Confluent airspace opacity noted in the left lower lobe, best seen on the lateral view. Findings concerning for pneumonia. Electronically Signed   By: Rolm Baptise M.D.   On: 02/26/2016 18:45    EKG:   Orders placed or performed during the hospital encounter of 02/26/16  . ED EKG within 10 minutes  . ED EKG within 10 minutes  . EKG 12-Lead  . EKG 12-Lead    IMPRESSION AND PLAN:  Principal Problem:   Sepsis ([place]) - hemodynamically stable, IV antibiotics given in the ED and continued on admission.  Lactic acid was within normal limits.  Cultures sent from ED. sputum culture added Active Problems:   HCAP  (healthcare-associated pneumonia) - IV antibiotics and cultures as above, PRN antitussive and duonebs.   Chronic combined systolic and diastolic CHF (congestive heart failure) (HCC) - not in acute exacerbation, continue home meds   ESRD on dialysis [facility]) - nephrology consult   HTN (hypertension) - continue home meds   Diabetes ([place]) - sliding scale insulin and glucose checks  All the records are reviewed and case discussed with ED provider. Management plans discussed with the patient and/or family.  DVT PROPHYLAXIS: SubQ heparin  GI PROPHYLAXIS: None  ADMISSION STATUS: Inpatient  CODE STATUS: Full Code Status History    Date Active Date Inactive Code Status Order ID Comments User Context   01/07/2016  1:32 AM 01/07/2016  5:24 PM Full Code [id number]  Harvie Bridge, DO ED   08/02/2015 11:16 PM 08/04/2015 11:11 PM Full Code [id number]  Quintella Baton, MD Inpatient   12/02/2014  8:52 PM 12/04/2014  5:53 PM Full Code [id number]  Vaughan Basta, MD Inpatient   11/10/2014 11:23 PM 11/12/2014  3:06 PM Full Code [id number]  Gladstone Lighter, MD Inpatient      TOTAL TIME TAKING CARE OF THIS PATIENT: 45 minutes.    Earla Charlie [place] 02/26/2016, 7:53 PM  [facility]  Office  [phone]  CC: Primary care physician; [place]

## 2016-02-26 NOTE — ED Triage Notes (Signed)
Patient presents to ER with chest pain and SOB X2days. Patient recieves dialysis M,W, and Friday. Last dialysis treatment was Wednesday and completed. Fistula located in L arm. Temp 102.3 upon arrival. A&O x4

## 2016-02-27 LAB — BASIC METABOLIC PANEL
Anion gap: 12 (ref 5–15)
BUN: 58 mg/dL — ABNORMAL HIGH (ref 6–20)
CO2: 19 mmol/L — ABNORMAL LOW (ref 22–32)
CREATININE: 9.33 mg/dL — AB (ref 0.61–1.24)
Calcium: 7.7 mg/dL — ABNORMAL LOW (ref 8.9–10.3)
Chloride: 105 mmol/L (ref 101–111)
GFR calc non Af Amer: 5 mL/min — ABNORMAL LOW (ref >60)
GFR, EST AFRICAN AMERICAN: 6 mL/min — AB (ref >60)
Glucose, Bld: 85 mg/dL (ref 65–99)
Potassium: 5.2 mmol/L — ABNORMAL HIGH (ref 3.5–5.1)
SODIUM: 136 mmol/L (ref 135–145)

## 2016-02-27 LAB — EXPECTORATED SPUTUM ASSESSMENT W REFEX TO RESP CULTURE

## 2016-02-27 LAB — GLUCOSE, CAPILLARY
GLUCOSE-CAPILLARY: 139 mg/dL — AB (ref 65–99)
GLUCOSE-CAPILLARY: 137 mg/dL — AB (ref 65–99)
Glucose-Capillary: 117 mg/dL — ABNORMAL HIGH (ref 65–99)
Glucose-Capillary: 72 mg/dL (ref 65–99)

## 2016-02-27 LAB — CBC
HCT: 34.2 % — ABNORMAL LOW (ref 40.0–52.0)
Hemoglobin: 10.8 g/dL — ABNORMAL LOW (ref 13.0–18.0)
MCH: 29.8 pg (ref 26.0–34.0)
MCHC: 31.7 g/dL — ABNORMAL LOW (ref 32.0–36.0)
MCV: 93.9 fL (ref 80.0–100.0)
PLATELETS: 234 10*3/uL (ref 150–440)
RBC: 3.64 MIL/uL — AB (ref 4.40–5.90)
RDW: 17.1 % — ABNORMAL HIGH (ref 11.5–14.5)
WBC: 12.7 10*3/uL — AB (ref 3.8–10.6)

## 2016-02-27 LAB — MRSA PCR SCREENING: MRSA BY PCR: NEGATIVE

## 2016-02-27 MED ORDER — VANCOMYCIN HCL 500 MG IV SOLR
500.0000 mg | Freq: Once | INTRAVENOUS | Status: AC
  Administered 2016-02-27: 500 mg via INTRAVENOUS
  Filled 2016-02-27: qty 500

## 2016-02-27 MED ORDER — VANCOMYCIN HCL IN DEXTROSE 750-5 MG/150ML-% IV SOLN
750.0000 mg | INTRAVENOUS | Status: DC
  Administered 2016-02-27: 750 mg via INTRAVENOUS
  Filled 2016-02-27 (×2): qty 150

## 2016-02-27 MED ORDER — GUAIFENESIN-DM 100-10 MG/5ML PO SYRP
5.0000 mL | ORAL_SOLUTION | ORAL | Status: DC | PRN
  Administered 2016-02-27 – 2016-02-28 (×2): 5 mL via ORAL
  Filled 2016-02-27 (×2): qty 5

## 2016-02-27 MED ORDER — PIPERACILLIN-TAZOBACTAM 3.375 G IVPB
3.3750 g | Freq: Two times a day (BID) | INTRAVENOUS | Status: DC
  Administered 2016-02-27 – 2016-02-28 (×3): 3.375 g via INTRAVENOUS
  Filled 2016-02-27 (×3): qty 50

## 2016-02-27 NOTE — Progress Notes (Signed)
Subjective:  Patient known to our practice from outpatient dialysis.  He presents for shortness of breath and is diagnosed with left lower lobe pneumonia.  He is currently getting antibiotics.   HEMODIALYSIS FLOWSHEET:  Blood Flow Rate (mL/min): 400 mL/min Arterial Pressure (mmHg): -160 mmHg Venous Pressure (mmHg): 200 mmHg Transmembrane Pressure (mmHg): 20 mmHg Ultrafiltration Rate (mL/min): 570 mL/min Dialysate Flow Rate (mL/min): 800 ml/min Conductivity: Machine : 14.2 Conductivity: Machine : 14.2 Dialysis Fluid Bolus: Normal Saline Bolus Amount (mL): 100 mL Intra-Hemodialysis Comments: 2000     Objective:  Vital signs in last 24 hours:  Temp:  [98.1 F (36.7 C)-102.3 F (39.1 C)] 98.1 F (36.7 C) (01/05 0920) Pulse Rate:  [67-91] 72 (01/05 1353) Resp:  [18-23] 19 (01/05 1353) BP: (126-159)/(70-107) 158/98 (01/05 1353) SpO2:  [96 %-100 %] 100 % (01/05 1353) Weight:  [76.2 kg (168 lb 1.6 oz)-79.6 kg (175 lb 7.8 oz)] 79.6 kg (175 lb 7.8 oz) (01/05 1351)  Weight change:  Filed Weights   02/27/16 0408 02/27/16 1010 02/27/16 1351  Weight: 77.1 kg (170 lb) 77.1 kg (170 lb) 79.6 kg (175 lb 7.8 oz)    Intake/Output:    Intake/Output Summary (Last 24 hours) at 02/27/16 1427 Last data filed at 02/27/16 1351  Gross per 24 hour  Intake                0 ml  Output             1308 ml  Net            -1308 ml     Physical Exam: General: No acute distress, lying in the bed  HEENT Anicteric, moist mucous membranes  Neck supple  Pulm/lungs Coarse crackles bilaterally, room air  CVS/Heart Regular, no rub or gallop  Abdomen:  Soft, nontender, nondistended  Extremities: No peripheral edema  Neurologic: Alert, oriented  Skin: vitiligo  Access: AVF       Basic Metabolic Panel:   Recent Labs Lab 02/26/16 1815 02/27/16 0526  NA 137 136  K 5.0 5.2*  CL 102 105  CO2 23 19*  GLUCOSE 93 85  BUN 51* 58*  CREATININE 8.74* 9.33*  CALCIUM 8.3* 7.7*      CBC:  Recent Labs Lab 02/26/16 1815 02/27/16 0526  WBC 11.1* 12.7*  HGB 10.9* 10.8*  HCT 33.0* 34.2*  MCV 91.7 93.9  PLT 259 234      Microbiology:  Recent Results (from the past 720 hour(s))  Culture, blood (routine x 2)     Status: None (Preliminary result)   Collection Time: 02/26/16  8:03 PM  Result Value Ref Range Status   Specimen Description BLOOD MEDIAL R FOREARM  Final   Special Requests BOTTLES DRAWN AEROBIC AND ANAEROBIC [id number] ANA 11ML  Final   Culture NO GROWTH < 12 HOURS  Final   Report Status PENDING  Incomplete  Culture, blood (routine x 2)     Status: None (Preliminary result)   Collection Time: 02/26/16  8:03 PM  Result Value Ref Range Status   Specimen Description BLOOD LATERAL R FOREARM  Final   Special Requests BOTTLES DRAWN AEROBIC AND ANAEROBIC ANA 6 AER 9ML  Final   Culture NO GROWTH < 12 HOURS  Final   Report Status PENDING  Incomplete  Rapid Influenza A&B Antigens ([facility] only)     Status: None   Collection Time: 02/26/16  8:03 PM  Result Value Ref Range Status   Influenza A ([facility]) NEGATIVE NEGATIVE  Final   Influenza B ([place]) NEGATIVE NEGATIVE Final    Coagulation Studies: No results for input(s): LABPROT, INR in the last 72 hours.  Urinalysis: No results for input(s): COLORURINE, LABSPEC, PHURINE, GLUCOSEU, HGBUR, BILIRUBINUR, KETONESUR, PROTEINUR, UROBILINOGEN, NITRITE, LEUKOCYTESUR in the last 72 hours.  Invalid input(s): APPERANCEUR    Imaging: Dg Chest 2 View  Result Date: 02/26/2016 CLINICAL DATA:  Chest pain, shortness of breath for 2 days EXAM: CHEST  2 VIEW COMPARISON:  01/14/2016 FINDINGS: Cardiomegaly. Focal airspace opacity noted inferiorly on the lateral view, likely in the left lower lobe concerning for pneumonia. No effusions. No confluent opacity on the right. No acute bony abnormality. IMPRESSION: Cardiomegaly. Confluent airspace opacity noted in the left lower lobe, best seen on the lateral view. Findings concerning  for pneumonia. Electronically Signed   By: Rolm Baptise M.D.   On: 02/26/2016 18:45     Medications:    . amLODipine  10 mg Oral Daily  . calcium acetate  2,001 mg Oral TID WC  . calcium carbonate  2 tablet Oral TID AC  . cinacalcet  90 mg Oral Q breakfast  . heparin  5,000 Units Subcutaneous Q8H  . hydrALAZINE  25 mg Oral TID  . insulin aspart  0-5 Units Subcutaneous QHS  . insulin aspart  0-9 Units Subcutaneous TID WC  . irbesartan  300 mg Oral QPM  . piperacillin-tazobactam (ZOSYN)  IV  3.375 g Intravenous Q12H  . vancomycin  750 mg Intravenous Q M,W,F-HD   acetaminophen **OR** acetaminophen, guaiFENesin-dextromethorphan, ipratropium-albuterol, ondansetron **OR** ondansetron (ZOFRAN) IV  Assessment/ Plan:  61 y.o.African American male with hypertension, hepatitis C, ESRD started HD 10/13, anemia of CKD, admission for diarrhea/hyperkalemia 10/2012, admission for GI bleed 10/2012, SHPTH, GI bleed 3/15, seizure episode, admission for pulmonary edema 9/16.  1.  End-stage renal disease [facility], [place], [place] Patient seen during dialysis Tolerating well  2. AOCKD Hgb 10.8 Monitor  3. SHPTH - monitor phos Low phos diet Continue home dose of binders  4. Pneumonia Treatment as per IM team      LOS: 1 [name] 1/5/20182:27 PM

## 2016-02-27 NOTE — Progress Notes (Signed)
HD completed without issue. UF 1.4L. Patient tolerated well. No complaints

## 2016-02-27 NOTE — Progress Notes (Signed)
Pre-hd tx 

## 2016-02-27 NOTE — Progress Notes (Signed)
Pharmacy Antibiotic Note  [name] is a 61 y.o. male admitted on 02/26/2016 with PNA.  Pharmacy has been consulted for Zosyn and vancomycin dosing.  Plan: 1. Zosyn 3.375 gm IV Q12H EI 2. Vancomycin 1 gm IV x 1 in ED. Will give additional 500 mg IV x 1 for total 1.5 gm load, followed by vancomycin 750 mg IV Q-dialysis MWF. Pharmacy will continue to follow and adjust as needed to maintain trough 15 to 20 mcg/mL.   Height: 5\' 8"  (172.7 cm) Weight: 168 lb 1.6 oz (76.2 kg) IBW/kg (Calculated) : 68.4  Temp (24hrs), Avg:100.7 F (38.2 C), Min:99.1 F (37.3 C), Max:102.3 F (39.1 C)   Recent Labs Lab 02/26/16 1815 02/26/16 1821  WBC 11.1*  --   CREATININE 8.74*  --   LATICACIDVEN  --  0.9    Estimated Creatinine Clearance: 8.6 mL/min (by C-G formula based on SCr of 8.74 mg/dL (H)).    Allergies  Allergen Reactions  . Sulfa Antibiotics Rash    Thank you for allowing pharmacy to be a part of this patient's care.  Laural Benes, Pharm.D., BCPS Clinical Pharmacist 02/27/2016 2:25 AM

## 2016-02-27 NOTE — Progress Notes (Signed)
Hemodialysis started

## 2016-02-27 NOTE — Progress Notes (Signed)
Post HD assessment unchanged  

## 2016-02-27 NOTE — Progress Notes (Signed)
[facility] [place] NAME: Merville Hijazi    MR#:  [id number]  DATE OF BIRTH:  1955-03-23  SUBJECTIVE:  CHIEF COMPLAINT:   Chief Complaint  Patient presents with  . Chest Pain  . Shortness of Breath   Admitted for sepsis and shortness of breath. Found to have left lower lobe pneumonia. Patient returned from dialysis and is feeling some improvement.  Still has cough. Good appetite. Not lightheaded or dizzy. Afebrile.  REVIEW OF SYSTEMS:    Review of Systems  Constitutional: Positive for malaise/fatigue. Negative for chills and fever.  HENT: Negative for sore throat.   Eyes: Negative for blurred vision, double vision and pain.  Respiratory: Positive for cough and shortness of breath. Negative for hemoptysis and wheezing.   Cardiovascular: Negative for chest pain, palpitations, orthopnea and leg swelling.  Gastrointestinal: Negative for abdominal pain, constipation, diarrhea, heartburn, nausea and vomiting.  Genitourinary: Negative for dysuria and hematuria.  Musculoskeletal: Negative for back pain and joint pain.  Skin: Negative for rash.  Neurological: Negative for sensory change, speech change, focal weakness and headaches.  Endo/Heme/Allergies: Does not bruise/bleed easily.  Psychiatric/Behavioral: Negative for depression. The patient is not nervous/anxious.     DRUG ALLERGIES:   Allergies  Allergen Reactions  . Sulfa Antibiotics Rash    VITALS:  Blood pressure (!) 167/92, pulse 72, temperature 98.1 F (36.7 C), temperature source Oral, resp. rate 20, height 5\' 8"  (1.727 m), weight 79.6 kg (175 lb 7.8 oz), SpO2 100 %.  PHYSICAL EXAMINATION:   Physical Exam  GENERAL:  61 y.o.-year-old patient lying in the bed with no acute distress.  EYES: Pupils equal, round, reactive to light and accommodation. No scleral icterus. Extraocular muscles intact.  HEENT: Head atraumatic, normocephalic. Oropharynx and nasopharynx clear.  NECK:   Supple, no jugular venous distention. No thyroid enlargement, no tenderness.  LUNGS: Normal breath sounds bilaterally, no wheezing, rales, rhonchi. No use of accessory muscles of respiration.  CARDIOVASCULAR: S1, S2 normal. No murmurs, rubs, or gallops.  ABDOMEN: Soft, nontender, nondistended. Bowel sounds present. No organomegaly or mass.  EXTREMITIES: No cyanosis, clubbing or edema b/l.    NEUROLOGIC: Cranial nerves II through XII are intact. No focal Motor or sensory deficits b/l.   PSYCHIATRIC: The patient is alert and oriented x 3.  SKIN: No obvious rash, lesion, or ulcer.   LABORATORY PANEL:   CBC  Recent Labs Lab 02/27/16 0526  WBC 12.7*  HGB 10.8*  HCT 34.2*  PLT 234   ------------------------------------------------------------------------------------------------------------------ Chemistries   Recent Labs Lab 02/27/16 0526  NA 136  K 5.2*  CL 105  CO2 19*  GLUCOSE 85  BUN 58*  CREATININE 9.33*  CALCIUM 7.7*   ------------------------------------------------------------------------------------------------------------------  Cardiac Enzymes  Recent Labs Lab 02/26/16 1815  TROPONINI 0.12*   ------------------------------------------------------------------------------------------------------------------  RADIOLOGY:  Dg Chest 2 View  Result Date: 02/26/2016 CLINICAL DATA:  Chest pain, shortness of breath for 2 days EXAM: CHEST  2 VIEW COMPARISON:  01/14/2016 FINDINGS: Cardiomegaly. Focal airspace opacity noted inferiorly on the lateral view, likely in the left lower lobe concerning for pneumonia. No effusions. No confluent opacity on the right. No acute bony abnormality. IMPRESSION: Cardiomegaly. Confluent airspace opacity noted in the left lower lobe, best seen on the lateral view. Findings concerning for pneumonia. Electronically Signed   By: Rolm Baptise M.D.   On: 02/26/2016 18:45     ASSESSMENT AND PLAN:   * Left lower lobe healthcare acquired  pneumonia in a  patient with incisional disease on dialysis Continue IV antibiotics. Await for cultures. No IV fluids for sepsis due to being on hemodialysis. Afebrile today. Check MRSA PCR. Can discontinue vancomycin if PCR negative. Likely discharge tomorrow if improved.   * Chronic combined systolic and diastolic CHF (congestive heart failure) (HCC) - not in acute exacerbation, continue home meds    ESRD on dialysis [facility]) - nephrology consult Patient had dialysis today. Is on Monday Wednesday and Friday schedule.    HTN (hypertension) - continue home meds    Diabetes ([place]) - sliding scale insulin and glucose checks  All the records are reviewed and case discussed with Care Management/Social Workerr. Management plans discussed with the patient, family and they are in agreement.  CODE STATUS: FULL CODE  DVT Prophylaxis: SCDs  TOTAL TIME TAKING CARE OF THIS PATIENT: 35 minutes.   POSSIBLE D/C IN 1-2 DAYS, DEPENDING ON CLINICAL CONDITION.  Hillary Bow R M.D on 02/27/2016 at 3:00 PM  Between 7am to 6pm - Pager - [phone]  After 6pm go to [email or web address] - password [email or web address] [facility]  Office  [phone]  CC: Primary care physician; [facility]  Note: This dictation was prepared with Dragon dictation along with smaller phrase technology. Any transcriptional errors that result from this process are unintentional.

## 2016-02-28 LAB — CBC WITH DIFFERENTIAL/PLATELET
Basophils Absolute: 0.1 10*3/uL (ref 0–0.1)
Basophils Relative: 1 %
Eosinophils Absolute: 0.1 10*3/uL (ref 0–0.7)
Eosinophils Relative: 2 %
HEMATOCRIT: 33 % — AB (ref 40.0–52.0)
Hemoglobin: 10.7 g/dL — ABNORMAL LOW (ref 13.0–18.0)
LYMPHS PCT: 11 %
Lymphs Abs: 0.7 10*3/uL — ABNORMAL LOW (ref 1.0–3.6)
MCH: 29.8 pg (ref 26.0–34.0)
MCHC: 32.4 g/dL (ref 32.0–36.0)
MCV: 92.2 fL (ref 80.0–100.0)
Monocytes Absolute: 0.9 10*3/uL (ref 0.2–1.0)
Monocytes Relative: 13 %
NEUTROS ABS: 4.9 10*3/uL (ref 1.4–6.5)
Neutrophils Relative %: 73 %
Platelets: 256 10*3/uL (ref 150–440)
RBC: 3.59 MIL/uL — AB (ref 4.40–5.90)
RDW: 16.7 % — ABNORMAL HIGH (ref 11.5–14.5)
WBC: 6.7 10*3/uL (ref 3.8–10.6)

## 2016-02-28 LAB — GLUCOSE, CAPILLARY
GLUCOSE-CAPILLARY: 85 mg/dL (ref 65–99)
GLUCOSE-CAPILLARY: 109 mg/dL — AB (ref 65–99)

## 2016-02-28 LAB — BASIC METABOLIC PANEL
ANION GAP: 7 (ref 5–15)
BUN: 35 mg/dL — ABNORMAL HIGH (ref 6–20)
CO2: 30 mmol/L (ref 22–32)
Calcium: 7.8 mg/dL — ABNORMAL LOW (ref 8.9–10.3)
Chloride: 101 mmol/L (ref 101–111)
Creatinine, Ser: 6.55 mg/dL — ABNORMAL HIGH (ref 0.61–1.24)
GFR calc Af Amer: 9 mL/min — ABNORMAL LOW (ref >60)
GFR, EST NON AFRICAN AMERICAN: 8 mL/min — AB (ref >60)
Glucose, Bld: 106 mg/dL — ABNORMAL HIGH (ref 65–99)
POTASSIUM: 5.1 mmol/L (ref 3.5–5.1)
Sodium: 138 mmol/L (ref 135–145)

## 2016-02-28 LAB — HEPATITIS B SURFACE ANTIGEN: HEP B S AG: NEGATIVE

## 2016-02-28 LAB — HEPATITIS B SURFACE ANTIBODY, QUANTITATIVE: Hepatitis B-Post: 87.8 m[IU]/mL (ref >9.9)

## 2016-02-28 MED ORDER — IPRATROPIUM-ALBUTEROL 20-100 MCG/ACT IN AERS: 1.0000 | INHALATION_SPRAY | Freq: Four times a day (QID) | RESPIRATORY_TRACT | 5 refills | Status: DC

## 2016-02-28 MED ORDER — GUAIFENESIN-DM 100-10 MG/5ML PO SYRP: 5.0000 mL | ORAL_SOLUTION | ORAL | 0 refills | Status: DC | PRN

## 2016-02-28 MED ORDER — LEVOFLOXACIN 500 MG PO TABS: 500.0000 mg | ORAL_TABLET | ORAL | 0 refills | Status: DC

## 2016-02-28 MED ORDER — LEVOFLOXACIN 750 MG PO TABS
750.0000 mg | ORAL_TABLET | Freq: Once | ORAL | Status: AC
  Administered 2016-02-28: 750 mg via ORAL
  Filled 2016-02-28: qty 1

## 2016-02-28 NOTE — Progress Notes (Signed)
Patient A&O, VSS.  No complaints of pain.  Ambulating independently in room.  Discharge instructions reviewed with patient and prescriptions reviewed.  Understanding was verbalized and all questions were answered.  Patient discharged in stable condition via wheelchair escorted by nursing staff.

## 2016-02-28 NOTE — Progress Notes (Signed)
Subjective:   Patient is doing well Looking forward to get discharged 1300 cc of fluid was removed at dialysis Currently on room air  Objective:  Vital signs in last 24 hours:  Temp:  [98 F (36.7 C)-98.8 F (37.1 C)] 98.3 F (36.8 C) (01/06 1258) Pulse Rate:  [70-85] 72 (01/06 1258) Resp:  [16-20] 16 (01/06 1258) BP: (124-167)/(66-101) 124/71 (01/06 1258) SpO2:  [97 %-100 %] 97 % (01/06 1258) Weight:  [78.1 kg (172 lb 2.9 oz)-79.6 kg (175 lb 7.8 oz)] 78.1 kg (172 lb 2.9 oz) (01/06 0438)  Weight change: -2.269 kg (-5 lb) Filed Weights   02/27/16 1010 02/27/16 1351 02/28/16 0438  Weight: 77.1 kg (170 lb) 79.6 kg (175 lb 7.8 oz) 78.1 kg (172 lb 2.9 oz)    Intake/Output:    Intake/Output Summary (Last 24 hours) at 02/28/16 1349 Last data filed at 02/28/16 1026  Gross per 24 hour  Intake              890 ml  Output             1461 ml  Net             -571 ml     Physical Exam: General: No acute distress,  HEENT Anicteric, moist mucous membranes  Neck supple  Pulm/lungs clear bilaterally, room air  CVS/Heart Regular, no rub or gallop  Abdomen:  Soft, nontender, nondistended  Extremities: No peripheral edema  Neurologic: Alert, oriented  Skin: vitiligo  Access: AVF       Basic Metabolic Panel:   Recent Labs Lab 02/26/16 1815 02/27/16 0526 02/28/16 0439  NA 137 136 138  K 5.0 5.2* 5.1  CL 102 105 101  CO2 23 19* 30  GLUCOSE 93 85 106*  BUN 51* 58* 35*  CREATININE 8.74* 9.33* 6.55*  CALCIUM 8.3* 7.7* 7.8*     CBC:  Recent Labs Lab 02/26/16 1815 02/27/16 0526 02/28/16 0439  WBC 11.1* 12.7* 6.7  NEUTROABS  --   --  4.9  HGB 10.9* 10.8* 10.7*  HCT 33.0* 34.2* 33.0*  MCV 91.7 93.9 92.2  PLT 259 234 256      Microbiology:  Recent Results (from the past 720 hour(s))  Culture, sputum-assessment     Status: None   Collection Time: 02/26/16  3:43 PM  Result Value Ref Range Status   Specimen Description SPU  Final   Special Requests NONE   Final   Sputum evaluation THIS SPECIMEN IS ACCEPTABLE FOR SPUTUM CULTURE  Final   Report Status 02/27/2016 FINAL  Final  Culture, blood (routine x 2)     Status: None (Preliminary result)   Collection Time: 02/26/16  8:03 PM  Result Value Ref Range Status   Specimen Description BLOOD MEDIAL R FOREARM  Final   Special Requests BOTTLES DRAWN AEROBIC AND ANAEROBIC AER12 ANA 11ML  Final   Culture NO GROWTH 2 DAYS  Final   Report Status PENDING  Incomplete  Culture, blood (routine x 2)     Status: None (Preliminary result)   Collection Time: 02/26/16  8:03 PM  Result Value Ref Range Status   Specimen Description BLOOD LATERAL R FOREARM  Final   Special Requests BOTTLES DRAWN AEROBIC AND ANAEROBIC ANA 6 AER 9ML  Final   Culture NO GROWTH 2 DAYS  Final   Report Status PENDING  Incomplete  Rapid Influenza A&B Antigens ([place] only)     Status: None   Collection Time: 02/26/16  8:03 PM  Result Value Ref Range Status   Influenza A ([facility]) NEGATIVE NEGATIVE Final   Influenza B ([facility]) NEGATIVE NEGATIVE Final  MRSA PCR Screening     Status: None   Collection Time: 02/27/16  6:12 PM  Result Value Ref Range Status   MRSA by PCR NEGATIVE NEGATIVE Final    Comment:        The GeneXpert MRSA Assay (FDA approved for NASAL specimens only), is one component of a comprehensive MRSA colonization surveillance program. It is not intended to diagnose MRSA infection nor to guide or monitor treatment for MRSA infections.     Coagulation Studies: No results for input(s): LABPROT, INR in the last 72 hours.  Urinalysis: No results for input(s): COLORURINE, LABSPEC, PHURINE, GLUCOSEU, HGBUR, BILIRUBINUR, KETONESUR, PROTEINUR, UROBILINOGEN, NITRITE, LEUKOCYTESUR in the last 72 hours.  Invalid input(s): APPERANCEUR    Imaging: Dg Chest 2 View  Result Date: 02/26/2016 CLINICAL DATA:  Chest pain, shortness of breath for 2 days EXAM: CHEST  2 VIEW COMPARISON:  01/14/2016 FINDINGS: Cardiomegaly. Focal  airspace opacity noted inferiorly on the lateral view, likely in the left lower lobe concerning for pneumonia. No effusions. No confluent opacity on the right. No acute bony abnormality. IMPRESSION: Cardiomegaly. Confluent airspace opacity noted in the left lower lobe, best seen on the lateral view. Findings concerning for pneumonia. Electronically Signed   By: Rolm Baptise M.D.   On: 02/26/2016 18:45     Medications:    . amLODipine  10 mg Oral Daily  . calcium acetate  2,001 mg Oral TID WC  . calcium carbonate  2 tablet Oral TID AC  . cinacalcet  90 mg Oral Q breakfast  . heparin  5,000 Units Subcutaneous Q8H  . hydrALAZINE  25 mg Oral TID  . insulin aspart  0-5 Units Subcutaneous QHS  . insulin aspart  0-9 Units Subcutaneous TID WC  . irbesartan  300 mg Oral QPM   acetaminophen **OR** acetaminophen, guaiFENesin-dextromethorphan, ipratropium-albuterol, ondansetron **OR** ondansetron (ZOFRAN) IV  Assessment/ Plan:  61 y.o.African American male with hypertension, hepatitis C, ESRD started HD 10/13, anemia of CKD, admission for diarrhea/hyperkalemia 10/2012, admission for GI bleed 10/2012, SHPTH, GI bleed 3/15, seizure episode, admission for pulmonary edema 9/16.  1.  End-stage renal disease [facility], [place], [place] Resume normal dialysis schedule  2. AOCKD Hgb 10.7 Monitor  3. SHPTH - monitor phos Low phos diet Continue home dose of binders  4. Pneumonia Treatment as per IM team      LOS: 2 [place] Repsher 1/6/20181:49 PM

## 2016-02-28 NOTE — Discharge Summary (Signed)
[facility] NAME: [name]    MR#:  [id number]  DATE OF BIRTH:  [birth year]  DATE OF ADMISSION:  02/26/2016 ADMITTING PHYSICIAN: Lance Coon, MD  DATE OF DISCHARGE: 02/28/2016  2:59 PM  PRIMARY CARE PHYSICIAN: [facility]     ADMISSION DIAGNOSIS:  Atypical chest pain [R07.89] Troponin level elevated [R74.8] Pneumonia of left lower lobe due to infectious organism (HCC) [J18.1]  DISCHARGE DIAGNOSIS:  Principal Problem:   Sepsis ([place]) Active Problems:   Chronic combined systolic and diastolic CHF (congestive heart failure) (HCC)   ESRD on dialysis ([place])   HCAP (healthcare-associated pneumonia)   HTN (hypertension)   Diabetes ([place])   SECONDARY DIAGNOSIS:   Past Medical History:  Diagnosis Date  . Diabetes mellitus without complication ([place])   . ESRD (end stage renal disease) ([place])   . HTN (hypertension) 08/02/2015  . Peripheral vascular disease ([place])     .pro HOSPITAL COURSE:   The patient is 61 year old male with past medical history significant for history of diabetes, end-stage renal disease, hypertension, peripheral vascular disease, who presents to the hospital with complaints of fever, productive cough for the past 24 hours. On arrival to the hospital. His white blood cell count was elevated and his chest x-ray was consistent with lobar consolidation concerning for pneumonia. Patient was admitted to the hospital for broad-spectrum antibiotic therapy. With conservative therapy as condition improved and he was felt to be stable to be discharged home. His white blood cell count normalized. Microbiologic studies revealed abundant white blood cells in sputum cultures, but no organisms, culture results are pending. Cultures are negative 2 for 2 days, MRSA PCR was negative, influenza test was negative for influenza A and B. Patient was noted to have hyperkalemia, which resolved with hemodialysis. #1. Left  lower lobe pneumonia, continue patient on levofloxacin to complete course, awaiting for sputum cultures, it is recommended to follow cultures as outpatient to ensure adequate treatment #2. Chronic combined systolic and diastolic CHF, stable, patient is to continue dialysis as per schedule #3. End-stage renal disease, patient received dialysis while in the hospital, he is to continue dialysis as per schedule #4. Diabetes mellitus type 2, the patient is to continue his outpatient medications, no changes were made in the hospital, blood glucose level was around 170 last night  DISCHARGE CONDITIONS:   Stable  CONSULTS OBTAINED:  Treatment Team:  Murlean Iba, MD  DRUG ALLERGIES:   Allergies  Allergen Reactions  . Sulfa Antibiotics Rash    DISCHARGE MEDICATIONS:   Discharge Medication List as of 02/28/2016  1:59 PM    CONTINUE these medications which have CHANGED   Details  guaiFENesin-dextromethorphan (ROBITUSSIN DM) 100-10 MG/5ML syrup Take 5 mLs by mouth every 4 (four) hours as needed for cough (chest congestion)., Starting Sat 02/28/2016, Normal    Ipratropium-Albuterol (COMBIVENT RESPIMAT) 20-100 MCG/ACT AERS respimat Inhale 1 puff into the lungs every 6 (six) hours., Starting Sat 02/28/2016, Normal    levofloxacin (LEVAQUIN) 500 MG tablet Take 1 tablet (500 mg total) by mouth every other day., Starting Sat 02/28/2016, Normal      CONTINUE these medications which have NOT CHANGED   Details  amLODipine (NORVASC) 10 MG tablet Take 10 mg by mouth daily., Historical Med    calcium acetate (PHOSLO) 667 MG capsule Take 1,334-2,001 mg by mouth 3 (three) times daily with meals., Starting Thu 12/11/2015, Historical Med    cinacalcet (SENSIPAR) 90 MG tablet Take 90 mg  by mouth daily., Historical Med    hydrALAZINE (APRESOLINE) 25 MG tablet Take 25 mg by mouth 3 (three) times daily., Historical Med    irbesartan (AVAPRO) 300 MG tablet Take 1 tablet (300 mg total) by mouth every evening.,  Starting Sun 08/03/2015, Normal    Calcium Carbonate Antacid (TUMS ULTRA 1000 PO) Take 2 tablets by mouth 3 (three) times daily before meals., Historical Med    loratadine (CLARITIN) 10 MG tablet Take 10 mg by mouth daily as needed for itching. , Historical Med         DISCHARGE INSTRUCTIONS:    The patient is to follow-up with primary care physician as outpatient was in 1 week  If you experience worsening of your admission symptoms, develop shortness of breath, life threatening emergency, suicidal or homicidal thoughts you must seek medical attention immediately by calling 911 or calling your MD immediately  if symptoms less severe.  You Must read complete instructions/literature along with all the possible adverse reactions/side effects for all the Medicines you take and that have been prescribed to you. Take any new Medicines after you have completely understood and accept all the possible adverse reactions/side effects.   Please note  You were cared for by a hospitalist during your hospital stay. If you have any questions about your discharge medications or the care you received while you were in the hospital after you are discharged, you can call the unit and asked to speak with the hospitalist on call if the hospitalist that took care of you is not available. Once you are discharged, your primary care physician will handle any further medical issues. Please note that NO REFILLS for any discharge medications will be authorized once you are discharged, as it is imperative that you return to your primary care physician (or establish a relationship with a primary care physician if you do not have one) for your aftercare needs so that they can reassess your need for medications and monitor your lab values.    Today   CHIEF COMPLAINT:   Chief Complaint  Patient presents with  . Chest Pain  . Shortness of Breath    HISTORY OF PRESENT ILLNESS:  [name]  is a 61 y.o. male with a  known history of diabetes, end-stage renal disease, hypertension, peripheral vascular disease, who presents to the hospital with complaints of fever, productive cough for the past 24 hours. On arrival to the hospital. His white blood cell count was elevated and his chest x-ray was consistent with lobar consolidation concerning for pneumonia. Patient was admitted to the hospital for broad-spectrum antibiotic therapy. With conservative therapy as condition improved and he was felt to be stable to be discharged home. His white blood cell count normalized. Microbiologic studies revealed abundant white blood cells in sputum cultures, but no organisms, culture results are pending. Cultures are negative 2 for 2 days, MRSA PCR was negative, influenza test was negative for influenza A and B. Patient was noted to have hyperkalemia, which resolved with hemodialysis. #1. Left lower lobe pneumonia, continue patient on levofloxacin to complete course, awaiting for sputum cultures, it is recommended to follow cultures as outpatient to ensure adequate treatment #2. Chronic combined systolic and diastolic CHF, stable, patient is to continue dialysis as per schedule #3. End-stage renal disease, patient received dialysis while in the hospital, he is to continue dialysis as per schedule #4. Diabetes mellitus type 2, the patient is to continue his outpatient medications, no changes were made in the hospital,  blood glucose level was around 170 last night    VITAL SIGNS:  Blood pressure 124/71, pulse 72, temperature 98.3 F (36.8 C), temperature source Oral, resp. rate 16, height 5\' 8"  (1.727 m), weight 78.1 kg (172 lb 2.9 oz), SpO2 97 %.  I/O:   Intake/Output Summary (Last 24 hours) at 02/28/16 1712 Last data filed at 02/28/16 1435  Gross per 24 hour  Intake             1130 ml  Output                3 ml  Net             1127 ml    PHYSICAL EXAMINATION:  GENERAL:  61 y.o.-year-old patient lying in the bed with no  acute distress.  EYES: Pupils equal, round, reactive to light and accommodation. No scleral icterus. Extraocular muscles intact.  HEENT: Head atraumatic, normocephalic. Oropharynx and nasopharynx clear.  NECK:  Supple, no jugular venous distention. No thyroid enlargement, no tenderness.  LUNGS: Normal breath sounds bilaterally, no wheezing, rales,rhonchi or crepitation. No use of accessory muscles of respiration.  CARDIOVASCULAR: S1, S2 normal. No murmurs, rubs, or gallops.  ABDOMEN: Soft, non-tender, non-distended. Bowel sounds present. No organomegaly or mass.  EXTREMITIES: No pedal edema, cyanosis, or clubbing.  NEUROLOGIC: Cranial nerves II through XII are intact. Muscle strength 5/5 in all extremities. Sensation intact. Gait not checked.  PSYCHIATRIC: The patient is alert and oriented x 3.  SKIN: No obvious rash, lesion, or ulcer.   DATA REVIEW:   CBC  Recent Labs Lab 02/28/16 0439  WBC 6.7  HGB 10.7*  HCT 33.0*  PLT 256    Chemistries   Recent Labs Lab 02/28/16 0439  NA 138  K 5.1  CL 101  CO2 30  GLUCOSE 106*  BUN 35*  CREATININE 6.55*  CALCIUM 7.8*    Cardiac Enzymes  Recent Labs Lab 02/26/16 1815  TROPONINI 0.12*    Microbiology Results  Results for orders placed or performed during the hospital encounter of 02/26/16  Culture, sputum-assessment     Status: None   Collection Time: 02/26/16  3:43 PM  Result Value Ref Range Status   Specimen Description SPU  Final   Special Requests NONE  Final   Sputum evaluation THIS SPECIMEN IS ACCEPTABLE FOR SPUTUM CULTURE  Final   Report Status 02/27/2016 FINAL  Final  Culture, respiratory (NON-Expectorated)     Status: None (Preliminary result)   Collection Time: 02/26/16  3:43 PM  Result Value Ref Range Status   Specimen Description SPU  Final   Special Requests NONE Reflexed from [id number]  Final   Gram Stain   Final    ABUNDANT WBC PRESENT,BOTH PMN AND MONONUCLEAR NO ORGANISMS SEEN Performed at [facility]    Culture PENDING  Incomplete   Report Status PENDING  Incomplete  Culture, blood (routine x 2)     Status: None (Preliminary result)   Collection Time: 02/26/16  8:03 PM  Result Value Ref Range Status   Specimen Description BLOOD MEDIAL R FOREARM  Final   Special Requests BOTTLES DRAWN AEROBIC AND ANAEROBIC [id number] ANA 11ML  Final   Culture NO GROWTH 2 DAYS  Final   Report Status PENDING  Incomplete  Culture, blood (routine x 2)     Status: None (Preliminary result)   Collection Time: 02/26/16  8:03 PM  Result Value Ref Range Status   Specimen Description BLOOD LATERAL R FOREARM  Final   Special Requests BOTTLES DRAWN AEROBIC AND ANAEROBIC ANA 6 AER 9ML  Final   Culture NO GROWTH 2 DAYS  Final   Report Status PENDING  Incomplete  Rapid Influenza A&B Antigens ([facility] only)     Status: None   Collection Time: 02/26/16  8:03 PM  Result Value Ref Range Status   Influenza A ([place]) NEGATIVE NEGATIVE Final   Influenza B ([facility]) NEGATIVE NEGATIVE Final  MRSA PCR Screening     Status: None   Collection Time: 02/27/16  6:12 PM  Result Value Ref Range Status   MRSA by PCR NEGATIVE NEGATIVE Final    Comment:        The GeneXpert MRSA Assay (FDA approved for NASAL specimens only), is one component of a comprehensive MRSA colonization surveillance program. It is not intended to diagnose MRSA infection nor to guide or monitor treatment for MRSA infections.     RADIOLOGY:  Dg Chest 2 View  Result Date: 02/26/2016 CLINICAL DATA:  Chest pain, shortness of breath for 2 days EXAM: CHEST  2 VIEW COMPARISON:  01/14/2016 FINDINGS: Cardiomegaly. Focal airspace opacity noted inferiorly on the lateral view, likely in the left lower lobe concerning for pneumonia. No effusions. No confluent opacity on the right. No acute bony abnormality. IMPRESSION: Cardiomegaly. Confluent airspace opacity noted in the left lower lobe, best seen on the lateral view. Findings concerning for pneumonia.  Electronically Signed   By: Rolm Baptise M.D.   On: 02/26/2016 18:45    EKG:   Orders placed or performed during the hospital encounter of 02/26/16  . EKG 12-Lead  . EKG 12-Lead      Management plans discussed with the patient, family and they are in agreement.  CODE STATUS:     Code Status Orders        Start     Ordered   02/26/16 2107  Full code  Continuous     02/26/16 2107    Code Status History    Date Active Date Inactive Code Status Order ID Comments User Context   01/07/2016  1:32 AM 01/07/2016  5:24 PM Full Code [id number]  Harvie Bridge, DO ED   08/02/2015 11:16 PM 08/04/2015 11:11 PM Full Code [id number]  Quintella Baton, MD Inpatient   12/02/2014  8:52 PM 12/04/2014  5:53 PM Full Code [id number]  Vaughan Basta, MD Inpatient   11/10/2014 11:23 PM 11/12/2014  3:06 PM Full Code [id number]  Gladstone Lighter, MD Inpatient    Advance Directive Documentation   Flowsheet Row Most Recent Value  Type of Advance Directive  Healthcare Power of Attorney  Pre-existing out of facility DNR order (yellow form or pink MOST form)  No data  "MOST" Form in Place?  No data      TOTAL TIME TAKING CARE OF THIS PATIENT: 40 minutes.    Theodoro Grist M.D on 02/28/2016 at 5:12 PM  Between 7am to 6pm - Pager - [phone]  After 6pm go to [email or web address] - password [email or web address] [facility]  [facility]  Office  [phone]  CC: Primary care physician; [place]

## 2016-02-28 NOTE — Progress Notes (Signed)
Patient called out stating he was feeling short of breath and having some chest discomfort. Vital signs were taken 134/71, heart rate 81. Patient states he has been coughing and feels like this is the cause of chest discomfort. Patient resting quietly in bed. 2 liters of oxygen applied via nasal cannula and patient states he feels better at this time.  Terrial Rhodes

## 2016-03-01 LAB — CULTURE, RESPIRATORY W GRAM STAIN

## 2016-03-01 LAB — CULTURE, RESPIRATORY: CULTURE: NO GROWTH

## 2016-03-02 LAB — CULTURE, BLOOD (ROUTINE X 2)
CULTURE: NO GROWTH
Culture: NO GROWTH

## 2016-04-01 ENCOUNTER — Encounter (INDEPENDENT_AMBULATORY_CARE_PROVIDER_SITE_OTHER): Payer: Self-pay

## 2016-04-01 ENCOUNTER — Other Ambulatory Visit (INDEPENDENT_AMBULATORY_CARE_PROVIDER_SITE_OTHER): Payer: Self-pay

## 2016-04-01 DIAGNOSIS — N186 End stage renal disease: Secondary | ICD-10-CM

## 2016-04-01 DIAGNOSIS — Z992 Dependence on renal dialysis: Principal | ICD-10-CM

## 2016-04-06 ENCOUNTER — Encounter
Admission: RE | Disposition: A | Discharge status: Home / Self Care | Payer: Self-pay | Source: Ambulatory Visit | Attending: Vascular Surgery

## 2016-04-06 ENCOUNTER — Ambulatory Visit
Admission: RE | Admit: 2016-04-06 | Discharge: 2016-04-06 | Disposition: A | Discharge status: Home / Self Care | Payer: Medicare Other | Source: Ambulatory Visit | Attending: Vascular Surgery | Admitting: Vascular Surgery

## 2016-04-06 ENCOUNTER — Encounter: Payer: Self-pay | Admitting: *Deleted

## 2016-04-06 DIAGNOSIS — F17210 Nicotine dependence, cigarettes, uncomplicated: Secondary | ICD-10-CM | POA: Insufficient documentation

## 2016-04-06 DIAGNOSIS — I2510 Atherosclerotic heart disease of native coronary artery without angina pectoris: Secondary | ICD-10-CM | POA: Insufficient documentation

## 2016-04-06 DIAGNOSIS — Z882 Allergy status to sulfonamides status: Secondary | ICD-10-CM | POA: Insufficient documentation

## 2016-04-06 DIAGNOSIS — E1122 Type 2 diabetes mellitus with diabetic chronic kidney disease: Secondary | ICD-10-CM | POA: Insufficient documentation

## 2016-04-06 DIAGNOSIS — Z8249 Family history of ischemic heart disease and other diseases of the circulatory system: Secondary | ICD-10-CM | POA: Diagnosis not present

## 2016-04-06 DIAGNOSIS — Z992 Dependence on renal dialysis: Secondary | ICD-10-CM | POA: Diagnosis not present

## 2016-04-06 DIAGNOSIS — T82838A Hemorrhage of vascular prosthetic devices, implants and grafts, initial encounter: Secondary | ICD-10-CM | POA: Diagnosis not present

## 2016-04-06 DIAGNOSIS — I120 Hypertensive chronic kidney disease with stage 5 chronic kidney disease or end stage renal disease: Secondary | ICD-10-CM | POA: Diagnosis not present

## 2016-04-06 DIAGNOSIS — I871 Compression of vein: Secondary | ICD-10-CM

## 2016-04-06 DIAGNOSIS — Y832 Surgical operation with anastomosis, bypass or graft as the cause of abnormal reaction of the patient, or of later complication, without mention of misadventure at the time of the procedure: Secondary | ICD-10-CM | POA: Diagnosis not present

## 2016-04-06 DIAGNOSIS — E1151 Type 2 diabetes mellitus with diabetic peripheral angiopathy without gangrene: Secondary | ICD-10-CM | POA: Insufficient documentation

## 2016-04-06 DIAGNOSIS — N186 End stage renal disease: Secondary | ICD-10-CM

## 2016-04-06 DIAGNOSIS — I10 Essential (primary) hypertension: Secondary | ICD-10-CM | POA: Diagnosis not present

## 2016-04-06 DIAGNOSIS — T82868A Thrombosis of vascular prosthetic devices, implants and grafts, initial encounter: Secondary | ICD-10-CM | POA: Diagnosis not present

## 2016-04-06 DIAGNOSIS — E119 Type 2 diabetes mellitus without complications: Secondary | ICD-10-CM

## 2016-04-06 DIAGNOSIS — Z833 Family history of diabetes mellitus: Secondary | ICD-10-CM | POA: Diagnosis not present

## 2016-04-06 HISTORY — PX: A/V FISTULAGRAM: CATH118298

## 2016-04-06 HISTORY — PX: A/V SHUNT INTERVENTION: CATH118220

## 2016-04-06 SURGERY — A/V FISTULAGRAM
Anesthesia: Moderate Sedation

## 2016-04-06 MED ORDER — OXYCODONE-ACETAMINOPHEN 5-325 MG PO TABS: 1.0000 | ORAL_TABLET | Freq: Four times a day (QID) | ORAL | 0 refills | Status: DC | PRN

## 2016-04-06 MED ORDER — LIDOCAINE-EPINEPHRINE (PF) 2 %-1:200000 IJ SOLN
INTRAMUSCULAR | Status: AC
  Filled 2016-04-06: qty 20

## 2016-04-06 MED ORDER — ACETAMINOPHEN 500 MG PO TABS: 1000.0000 mg | ORAL_TABLET | Freq: Once | ORAL | Status: DC

## 2016-04-06 MED ORDER — SODIUM CHLORIDE 0.9 % IV SOLN
INTRAVENOUS | Status: DC
  Administered 2016-04-06: 20 mL via INTRAVENOUS

## 2016-04-06 MED ORDER — FENTANYL CITRATE (PF) 100 MCG/2ML IJ SOLN
INTRAMUSCULAR | Status: AC
  Filled 2016-04-06: qty 2

## 2016-04-06 MED ORDER — MIDAZOLAM HCL 5 MG/5ML IJ SOLN
INTRAMUSCULAR | Status: AC
  Filled 2016-04-06: qty 5

## 2016-04-06 MED ORDER — CEFAZOLIN IN D5W 1 GM/50ML IV SOLN
1.0000 g | Freq: Once | INTRAVENOUS | Status: AC
  Administered 2016-04-06: 1 g via INTRAVENOUS

## 2016-04-06 MED ORDER — MIDAZOLAM HCL 2 MG/2ML IJ SOLN
INTRAMUSCULAR | Status: DC | PRN
Start: 2016-04-06 — End: 2016-04-06
  Administered 2016-04-06 (×2): 1 mg via INTRAVENOUS
  Administered 2016-04-06: 2 mg via INTRAVENOUS

## 2016-04-06 MED ORDER — ACETAMINOPHEN 500 MG PO TABS
ORAL_TABLET | ORAL | Status: AC
  Filled 2016-04-06: qty 2

## 2016-04-06 MED ORDER — FENTANYL CITRATE (PF) 100 MCG/2ML IJ SOLN
INTRAMUSCULAR | Status: DC | PRN
  Administered 2016-04-06 (×3): 50 ug via INTRAVENOUS
  Administered 2016-04-06: 25 ug via INTRAVENOUS

## 2016-04-06 MED ORDER — HEPARIN SODIUM (PORCINE) 10000 UNIT/ML IJ SOLN
INTRAMUSCULAR | Status: AC
  Filled 2016-04-06: qty 1

## 2016-04-06 MED ORDER — HEPARIN SODIUM (PORCINE) 1000 UNIT/ML IJ SOLN
INTRAMUSCULAR | Status: AC
  Filled 2016-04-06: qty 1

## 2016-04-06 MED ORDER — HEPARIN (PORCINE) IN NACL 2-0.9 UNIT/ML-% IJ SOLN
INTRAMUSCULAR | Status: AC
  Filled 2016-04-06: qty 1000

## 2016-04-06 MED ORDER — IOPAMIDOL (ISOVUE-300) INJECTION 61%
INTRAVENOUS | Status: DC | PRN
  Administered 2016-04-06: 85 mL via INTRAVENOUS

## 2016-04-06 MED ORDER — LIDOCAINE HCL (PF) 1 % IJ SOLN
INTRAMUSCULAR | Status: AC
  Filled 2016-04-06: qty 30

## 2016-04-06 SURGICAL SUPPLY — 24 items
BALLN DORADO 8X40X80 (BALLOONS) ×4
BALLN LUTONIX AV 10X40X75 (BALLOONS) ×4
BALLN LUTONIX AV 12X40X75 (BALLOONS) ×3
BALLOON DORADO 8X40X80 (BALLOONS) ×2 IMPLANT
BALLOON LUTONIX AV 10X40X75 (BALLOONS) ×2 IMPLANT
BALLOON LUTONIX AV 12X40X75 (BALLOONS) ×2 IMPLANT
CATH BEACON 5.038 65CM KMP-01 (CATHETERS) ×4 IMPLANT
CATH PALINDROME RT-P 15FX19CM (CATHETERS) ×4 IMPLANT
CATH TORCON 5FR 0.38 (CATHETERS) ×8 IMPLANT
DEVICE PRESTO INFLATION (MISCELLANEOUS) ×8 IMPLANT
DRAPE BRACHIAL (DRAPES) ×8 IMPLANT
DRAPE INCISE IOBAN 66X45 STRL (DRAPES) ×4 IMPLANT
GUIDEWIRE ANGLED .035 180CM (WIRE) ×8 IMPLANT
NEEDLE ENTRY 21GA 7CM ECHOTIP (NEEDLE) ×4 IMPLANT
PACK ANGIOGRAPHY (CUSTOM PROCEDURE TRAY) ×4 IMPLANT
SET INTRO CAPELLA COAXIAL (SET/KITS/TRAYS/PACK) ×4 IMPLANT
SHEATH BRITE TIP 6FRX5.5 (SHEATH) ×12 IMPLANT
SHEATH BRITE TIP 7FRX5.5 (SHEATH) ×4 IMPLANT
SHEATH PINNACLE 11FRX10 (SHEATH) ×4 IMPLANT
SUT MNCRL AB 4-0 PS2 18 (SUTURE) ×4 IMPLANT
TOWEL OR 17X26 4PK STRL BLUE (TOWEL DISPOSABLE) ×8 IMPLANT
WIRE MAGIC TOR.035 180C (WIRE) ×4 IMPLANT
WIRE MAGIC TORQUE 260C (WIRE) ×4 IMPLANT
WIRE NITINOL .018 (WIRE) ×4 IMPLANT

## 2016-04-06 NOTE — Op Note (Signed)
[facility]   OPERATIVE NOTE     PROCEDURE: 1. Introduction catheter into superior vena cava left brachial vein approach with ultrasound guidance 2. Percutaneous transluminal angioplasty left innominate vein to 10 mm with 8 Lutonix balloon 3. Introduction catheter into left radiocephalic fistula for imaging of the fistula guidance 4. Introduction catheter into superior vena cava right brachial vein approach with ultrasound guidance 5. Introduction catheter into superior vena cava right internal jugular approach with ultrasound guidance 6. Percutaneous transluminal angioplasty right internal jugular vein with a 12 mm Lutonix 7. Placement of a right IJ tunneled palindrome catheter 19 cm tip to cuff  PRE-OPERATIVE DIAGNOSIS: end-stage renal requiring hemodialysis  POST-OPERATIVE DIAGNOSIS: same as above  SURGEON: Hortencia Pilar  ANESTHESIA: Conscious sedation was administered under my direct supervision. IV Versed plus fentanyl were utilized. Continuous ECG, pulse oximetry and blood pressure was monitored throughout the entire procedure.  Conscious sedation was for a total of 185 minutes.  ESTIMATED BLOOD LOSS: Minimal  CONTRAST:  85 cc  FLUOROSCOPY TIME:  8.5 minutes  FINDING(S): 1.  Tips of the catheter in the right atrium on fluoroscopy 2.  No obvious pneumothorax on fluoroscopy  SPECIMEN(S):  none  INDICATIONS:   [name] is a 61 y.o. male  presents with end stage renal disease.  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 This is associated with skin breakdown. Therefore, the patient will undergo angiography to both see if his fistula can be depressurized therefore allow the skin to heal and subsequently continued use or whether he will require a new dialysis access. If he does indeed require new dialysis access he  will requires a tunneled dialysis catheter placement.  The patient is informed of  the risks catheter placement include but are not limited to: bleeding, infection, central venous injury, pneumothorax, possible venous stenosis, possible malpositioning in the venous system, and possible infections related to long-term catheter presence.  The patient was aware of these risks and agreed to proceed.  DESCRIPTION: The patient was taken back to Special Procedure suite.  Prior to sedation, the patient was given IV antibiotics.  After obtaining adequate sedation, the patient was prepped and draped in the standard fashion for a chest or neck tunneled dialysis catheter placement.  Appropriate Time Out is called.    Ultrasound is placed in a sterile sleeve at is used to evaluate the distal portion of the fistula near the anastomosis with the radial artery. It is noted to be somewhat small in this area and markedly pulsatile. After lidocaine is infiltrated the ultrasound images recorded and the puncture is made with a microneedle under direct ultrasound visualization. Microwire is then advanced without difficulty followed by micro-sheath J-wire and a 6 French sheath. 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 More importantly, at the level of the antecubital fossa the outflow vein becomes very sclerotic and markedly tortuous appearing in a sigmoid fashion. There is very poor filling of the veins more proximally. This area is so narrowed that the Glidewire essentially occludes the outflow. I and using a 0.035 Glidewire i.e. the outflow is 35,000 of an inch in diameter. Attempts at crossing this area are successful with the wire but the catheter will not track and given that intervention over a floppy Glidewire would be futile  I elected to abandon salvage of this AV fistula and initiate plans for a new access.  Ultrasound  was in the sterile sleeve. It was used to evaluate the brachial veins at the level just proximal to the antecubital fossa. A vein adjacent to the artery was identified which was easily compressible and echolucent indicating patency. 1% lidocaine was infiltrated into the soft tissues. Image was recorded for the permanent record and a micropuncture needle was inserted into the vein without difficulty microwire followed micro-sheath J-wire followed by 6 French sheath. Hand injection of contrast through the 6 French sheath demonstrated the veins of the upper extremity on the left. However there was poor filling of the central veins and extensive collaterals surrounding the shoulder. This suggested a central venous lesion. 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 The Glidewire and Kumpe catheter were negotiated through this tight stenosis and hand injection of contrast through the Kumpe was used to image the superior vena cava which was widely patent filling the atrium and subsequently the pulmonary arteries. Magic torque wires then reintroduced and a 8 x 4 [place] balloon was advanced across the lesion. Inflation was to 20 atm for 1 minute. A 10 x 4 Lutonix balloon was then advanced across the lesion inflation was for to 14 atm for 1 full minute. Follow-up imaging now demonstrated approximately 10 mm lumen matching the a Lutonix balloon quite nicely demonstrating a successful angioplasty. This now will reduce the venous pressure of the left arm. It also demonstrates this area is treatable and the left arm is therefore still a reasonable choice for future access such as a brachial axillary graft.  Attention was then turned to the right arm. The patient's right  arm was then extended [facility] port and prepped and draped in a sterile fashion. Ultrasound was placed in a sterile sleeve and ultrasound was used to identify a brachial vein at the level of the antecubital fossa. 1% lidocaine was infiltrated ultrasound demonstrated echolucent and compressible vein image was recorded and the access was obtained under direct ultrasound visualization. Microwire followed micro-sheath J-wire followed by 6 French sheath. Hand injection contrast demonstrated the brachial veins and axillary veins are widely patent the basilic vein is not visualized. 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 The wire was negotiated from the subclavian into the internal jugular and at the level of the angle of the mandible the jugular vein was imaged again noting the more proximal stenosis. The catheter was then positioned in the superior vena cava and superior vena cava cavogram performed. Given these findings it appeared that with angioplasty the jugular vein would be accessible on the right 4 catheter placement.  The patient was then reprepped and draped so that the right neck and chest wall are prepped and draped in sterile fashion. Ultrasound is utilized ultrasound is placed in a sterile sleeve jugular vein is echolucent and compressible. 1% lidocaine is infiltrated in soft tissues both on the chest wall as well as in the base of the neck. Subsequently a Seldinger needle  was introduced under direct ultrasound visualization. The jugular vein was noted to be echolucent compressible indicating patency and an image was recorded for the permanent record. J-wire was then advanced without difficulty and a 7 French sheath was inserted. Magic torque wire was then  advanced hand injection contrast was used to demonstrate the jugular innominate lesion on the right and subsequently a 12 x 4 Lutonix balloon was advanced across this lesion was inflated to 14 atm for 1 minute. Follow-up imaging demonstrated less than 20% residual stenosis and I elected to proceed with catheter placement. At this point  in the case the access was lost and I again used ultrasound to restart the jugular access advancing the wire through the micro-sheath and then increasing to an 11 [place] sheath. Wire was then negotiated down to the atrium dilator and peel-away sheath were then inserted under direct fluoroscopic guidance  The catheters and advanced through the peel-away sheath after removal of the wire. It is approximated to the chest wall after verifying the tips at the atrial caval junction and an exit site is selected.  Small incision is made at the selected exit site and the tunneling device was passed subcutaneously to the neck counter incision. Catheter is then pulled through the subcutaneous tunnel. The catheter is then verified for tip position under fluoroscopy, transected and the hub assembly connected.    Each port was tested by aspirating and flushing.  No resistance was noted.  Each port was then thoroughly flushed with heparinized saline.  The catheter was secured in placed with two interrupted stitches of 0 silk tied to the catheter.  The neck incision was closed with a U-stitch of 4-0 Monocryl.  The neck and chest incision were cleaned and sterile bandages applied including a Biopatch.  Each port was then packed with concentrated heparin (1000 Units/mL) at the manufacturer recommended volumes to each port.  Sterile caps were applied to each port.  On completion fluoroscopy, the tips of the catheter were in the right atrium, and there was no evidence of pneumothorax.  COMPLICATIONS: None  CONDITION: Unchanged   Hortencia Pilar  [facility] Office:  [phone]   04/06/2016, 11:23 AM

## 2016-04-06 NOTE — H&P (Addendum)
[facility] Admission History & Physical  MRN : [id number]  [name] is a 61 y.o. (Nov 02, 1955) male who presents with chief complaint of No chief complaint on file. [name]  History of Present Illness: I am asked to evaluate the patient by the dialysis center. The patient was sent here because they were unable to achieve adequate dialysis this morning. Furthermore the Center states there is very poor thrill and bruit. The patient states there there have been increasing problems with the access, such as "pulling clots" during dialysis and prolonged bleeding after decannulation. The patient estimates these problems have been going on for several weeks. The patient is unaware of any other change.  Patient denies pain or tenderness overlying the access.  There is no pain with dialysis.  The patient denies hand pain or finger pain consistent with steal syndrome.   There have been past interventions but no declots of this access that he is aware of.  The patient is not chronically hypotensive on dialysis.  Current Facility-Administered Medications  Medication Dose Route Frequency Provider Last Rate Last Dose  . 0.9 %  sodium chloride infusion   Intravenous Continuous Katha Cabal, MD 20 mL/hr at 04/06/16 0740 20 mL at 04/06/16 0740    Past Medical History:  Diagnosis Date  . Diabetes mellitus without complication ([place])   . ESRD (end stage renal disease) ([place])   . HTN (hypertension) 08/02/2015  . Peripheral vascular disease [facility])     Past Surgical History:  Procedure Laterality Date  . AV FISTULA PLACEMENT Left 2014  . PERIPHERAL VASCULAR CATHETERIZATION N/A 10/03/2014   Procedure: A/V Shuntogram/Fistulagram;  Surgeon: Algernon Huxley, MD;  Location: [facility];  Service: Cardiovascular;  Laterality: N/A;  . PERIPHERAL VASCULAR CATHETERIZATION Left 10/03/2014   Procedure: A/V Shunt Intervention;  Surgeon: Algernon Huxley, MD;  Location: [facility];   Service: Cardiovascular;  Laterality: Left;  . PERIPHERAL VASCULAR CATHETERIZATION Left 05/01/2015   Procedure: A/V Shuntogram/Fistulagram;  Surgeon: Algernon Huxley, MD;  Location: [facility];  Service: Cardiovascular;  Laterality: Left;  . PERIPHERAL VASCULAR CATHETERIZATION N/A 05/01/2015   Procedure: A/V Shunt Intervention;  Surgeon: Algernon Huxley, MD;  Location: [facility];  Service: Cardiovascular;  Laterality: N/A;    Social History Social History  Substance Use Topics  . Smoking status: Current Some Day Smoker    Packs/day: 0.25    Years: 40.00    Types: Cigarettes  . Smokeless tobacco: Never Used  . Alcohol use No    Family History Family History  Problem Relation Age of Onset  . Hypertension Son   . Diabetes Son   . Hypertension Mother   . Diabetes Sister     No family history of bleeding or clotting disorders, autoimmune disease or porphyria  Allergies  Allergen Reactions  . Sulfa Antibiotics Rash     REVIEW OF SYSTEMS (Negative unless checked)  Constitutional: [] Weight loss  [] Fever  [] Chills Cardiac: [] Chest pain   [] Chest pressure   [] Palpitations   [] Shortness of breath when laying flat   [] Shortness of breath at rest   [x] Shortness of breath with exertion. Vascular:  [] Pain in legs with walking   [] Pain in legs at rest   [] Pain in legs when laying flat   [] Claudication   [] Pain in feet when walking  [] Pain in feet at rest  [] Pain in feet when laying flat   [] History of DVT   [] Phlebitis   []   Swelling in legs   [] Varicose veins   [] Non-healing ulcers Pulmonary:   [] Uses home oxygen   [] Productive cough   [] Hemoptysis   [] Wheeze  [] COPD   [] Asthma Neurologic:  [] Dizziness  [] Blackouts   [] Seizures   [] History of stroke   [] History of TIA  [] Aphasia   [] Temporary blindness   [] Dysphagia   [] Weakness or numbness in arms   [] Weakness or numbness in legs Musculoskeletal:  [] Arthritis   [] Joint swelling   [] Joint pain   [] Low back pain Hematologic:  [] Easy  bruising  [] Easy bleeding   [] Hypercoagulable state   [] Anemic  [] Hepatitis Gastrointestinal:  [] Blood in stool   [] Vomiting blood  [] Gastroesophageal reflux/heartburn   [] Difficulty swallowing. Genitourinary:  [x] Chronic kidney disease   [] Difficult urination  [] Frequent urination  [] Burning with urination   [] Blood in urine Skin:  [] Rashes   [] Ulcers   [] Wounds Psychological:  [] History of anxiety   []  History of major depression.  Physical Examination  Vitals:   04/06/16 0717 04/06/16 0825 04/06/16 0830 04/06/16 0835  BP: (!) 143/85     Pulse: 76     Temp: 97.8 F (36.6 C)     SpO2: 98% 100% 94% 92%  Weight: 172 lb (78 kg)     Height: 5\' 8"  (1.727 m)      Body mass index is 26.15 kg/m. Gen: WD/WN, NAD Head: [place]/AT, No temporalis wasting. Prominent temp pulse not noted. Ear/Nose/Throat: Hearing grossly intact, nares w/o erythema or drainage, oropharynx w/o Erythema/Exudate,  Eyes: Conjunctiva clear, sclera non-icteric Neck: Trachea midline.  No JVD.  Pulmonary:  Good air movement, respirations not labored, no use of accessory muscles.  Cardiac: RRR, normal S1, S2. Vascular:  Left arm radialcephalic fistula pulsatile staccato bruit, there are 2 areas of skin erosion overlying the distal aneurysmal area. Vessel Right Left  Radial Palpable Palpable  Ulnar Not Palpable Not Palpable  Brachial Palpable Palpable  Carotid Palpable, without bruit Palpable, without bruit  Gastrointestinal: soft, non-tender/non-distended. No guarding/reflex.  Musculoskeletal: M/S 5/5 throughout.  Extremities without ischemic changes.  No deformity or atrophy.  Neurologic: Sensation grossly intact in extremities.  Symmetrical.  Speech is fluent. Motor exam as listed above. Psychiatric: Judgment intact, Mood & affect appropriate for pt's clinical situation. Dermatologic: No rashes or ulcers noted.  No cellulitis or open wounds. Lymph : No Cervical, Axillary, or Inguinal lymphadenopathy.   CBC Lab  Results  Component Value Date   WBC 6.7 02/28/2016   HGB 10.7 (L) 02/28/2016   HCT 33.0 (L) 02/28/2016   MCV 92.2 02/28/2016   PLT 256 02/28/2016    BMET    Component Value Date/Time   NA 138 02/28/2016 0439   NA 139 04/30/2013 1203   K 5.1 02/28/2016 0439   K 4.4 04/30/2013 1203   CL 101 02/28/2016 0439   CL 107 04/30/2013 1203   CO2 30 02/28/2016 0439   CO2 25 04/30/2013 1203   GLUCOSE 106 (H) 02/28/2016 0439   GLUCOSE 104 (H) 04/30/2013 1203   BUN 35 (H) 02/28/2016 0439   BUN 76 (H) 04/30/2013 1203   CREATININE 6.55 (H) 02/28/2016 0439   CREATININE 9.49 (H) 04/30/2013 1203   CALCIUM 7.8 (L) 02/28/2016 0439   CALCIUM 8.0 (L) 04/30/2013 1203   GFRNONAA 8 (L) 02/28/2016 0439   GFRNONAA 5 (L) 04/30/2013 1203   GFRAA 9 (L) 02/28/2016 0439   GFRAA 6 (L) 04/30/2013 1203   CrCl cannot be calculated (Patient's most recent lab result is older than the  maximum 21 days allowed.).  COAG Lab Results  Component Value Date   INR 1.1 04/26/2013   INR 1.0 07/19/2012   INR 1.2 12/12/2011    Radiology No results found.  Assessment/Plan 1.  Complication dialysis device with thrombosis AV access:  Patient's left wrist dialysis access is malfunctioning. The patient will undergo angiography and correction of any problems using interventional techniques with the hope of restoring function to the access.  The risks and benefits were described to the patient.  All questions were answered.  The patient agrees to proceed with angiography and intervention. If successful intervention is not achieved then a tunneled catheter will be placed. Given the poor quality of the skin over the one aneurysmal area this fistula may not be salvageable. The patient is certainly at an increased risk for bleeding if. This fistula is continued to be cannulated unless it can be revised. Potassium will be drawn to ensure that it is an appropriate level prior to performing intervention. 2.  End-stage renal disease  requiring hemodialysis:  Patient will continue dialysis therapy without further interruption if a successful intervention is not achieved then a tunneled catheter will be placed. Dialysis has already been arranged. 3.  Hypertension:  Patient will continue medical management; nephrology is following no changes in oral medications. 4. Diabetes mellitus:  Glucose will be monitored and oral medications been held this morning once the patient has undergone the patient's procedure po intake will be reinitiated and again Accu-Cheks will be used to assess the blood glucose level and treat as needed. The patient will be restarted on the patient's usual hypoglycemic regime 5.  Coronary artery disease:  EKG will be monitored. Nitrates will be used if needed. The patient's oral cardiac medications will be continued.    Hortencia Pilar, MD  04/06/2016 8:52 AM

## 2016-04-07 ENCOUNTER — Encounter: Payer: Self-pay | Admitting: Vascular Surgery

## 2016-04-07 NOTE — Progress Notes (Signed)
Order entered for potassium wrong and unable to enter into sunquest. Slip given to RN in specials to show doctor. Result was 4.7 mmol/L

## 2016-04-22 DIAGNOSIS — I272 Pulmonary hypertension, unspecified: Secondary | ICD-10-CM

## 2016-04-22 HISTORY — DX: Pulmonary hypertension, unspecified: I27.20

## 2016-04-29 ENCOUNTER — Inpatient Hospital Stay
Admission: EM | Admit: 2016-04-29 | Discharge: 2016-05-01 | DRG: 291 | Disposition: A | Discharge status: Home / Self Care | Payer: Medicare Other | Attending: Internal Medicine | Admitting: Internal Medicine

## 2016-04-29 ENCOUNTER — Emergency Department: Payer: Medicare Other

## 2016-04-29 DIAGNOSIS — I132 Hypertensive heart and chronic kidney disease with heart failure and with stage 5 chronic kidney disease, or end stage renal disease: Principal | ICD-10-CM | POA: Diagnosis present

## 2016-04-29 DIAGNOSIS — J9601 Acute respiratory failure with hypoxia: Secondary | ICD-10-CM | POA: Diagnosis present

## 2016-04-29 DIAGNOSIS — I248 Other forms of acute ischemic heart disease: Secondary | ICD-10-CM | POA: Diagnosis present

## 2016-04-29 DIAGNOSIS — E875 Hyperkalemia: Secondary | ICD-10-CM | POA: Diagnosis present

## 2016-04-29 DIAGNOSIS — E1151 Type 2 diabetes mellitus with diabetic peripheral angiopathy without gangrene: Secondary | ICD-10-CM | POA: Diagnosis present

## 2016-04-29 DIAGNOSIS — E1122 Type 2 diabetes mellitus with diabetic chronic kidney disease: Secondary | ICD-10-CM | POA: Diagnosis present

## 2016-04-29 DIAGNOSIS — N186 End stage renal disease: Secondary | ICD-10-CM | POA: Diagnosis present

## 2016-04-29 DIAGNOSIS — Z79899 Other long term (current) drug therapy: Secondary | ICD-10-CM

## 2016-04-29 DIAGNOSIS — I5042 Chronic combined systolic (congestive) and diastolic (congestive) heart failure: Secondary | ICD-10-CM | POA: Diagnosis present

## 2016-04-29 DIAGNOSIS — Z882 Allergy status to sulfonamides status: Secondary | ICD-10-CM | POA: Diagnosis not present

## 2016-04-29 DIAGNOSIS — Z992 Dependence on renal dialysis: Secondary | ICD-10-CM | POA: Diagnosis not present

## 2016-04-29 DIAGNOSIS — I5043 Acute on chronic combined systolic (congestive) and diastolic (congestive) heart failure: Secondary | ICD-10-CM | POA: Diagnosis present

## 2016-04-29 DIAGNOSIS — B1920 Unspecified viral hepatitis C without hepatic coma: Secondary | ICD-10-CM | POA: Diagnosis present

## 2016-04-29 DIAGNOSIS — D631 Anemia in chronic kidney disease: Secondary | ICD-10-CM | POA: Diagnosis present

## 2016-04-29 DIAGNOSIS — Z833 Family history of diabetes mellitus: Secondary | ICD-10-CM

## 2016-04-29 DIAGNOSIS — E119 Type 2 diabetes mellitus without complications: Secondary | ICD-10-CM

## 2016-04-29 DIAGNOSIS — F17210 Nicotine dependence, cigarettes, uncomplicated: Secondary | ICD-10-CM | POA: Diagnosis present

## 2016-04-29 DIAGNOSIS — Z8249 Family history of ischemic heart disease and other diseases of the circulatory system: Secondary | ICD-10-CM | POA: Diagnosis not present

## 2016-04-29 DIAGNOSIS — I5033 Acute on chronic diastolic (congestive) heart failure: Secondary | ICD-10-CM | POA: Diagnosis present

## 2016-04-29 DIAGNOSIS — I10 Essential (primary) hypertension: Secondary | ICD-10-CM | POA: Diagnosis present

## 2016-04-29 LAB — CBC WITH DIFFERENTIAL/PLATELET
BASOS ABS: 0.1 10*3/uL (ref 0–0.1)
BASOS PCT: 1 %
EOS PCT: 1 %
Eosinophils Absolute: 0 10*3/uL (ref 0–0.7)
HCT: 38.2 % — ABNORMAL LOW (ref 40.0–52.0)
Hemoglobin: 12.3 g/dL — ABNORMAL LOW (ref 13.0–18.0)
LYMPHS PCT: 14 %
Lymphs Abs: 0.8 10*3/uL — ABNORMAL LOW (ref 1.0–3.6)
MCH: 29.9 pg (ref 26.0–34.0)
MCHC: 32.3 g/dL (ref 32.0–36.0)
MCV: 92.6 fL (ref 80.0–100.0)
MONO ABS: 0.8 10*3/uL (ref 0.2–1.0)
Monocytes Relative: 14 %
Neutro Abs: 3.9 10*3/uL (ref 1.4–6.5)
Neutrophils Relative %: 70 %
PLATELETS: 163 10*3/uL (ref 150–440)
RBC: 4.12 MIL/uL — AB (ref 4.40–5.90)
RDW: 18.3 % — AB (ref 11.5–14.5)
WBC: 5.6 10*3/uL (ref 3.8–10.6)

## 2016-04-29 LAB — COMPREHENSIVE METABOLIC PANEL
ALK PHOS: 76 U/L (ref 38–126)
ALT: 38 U/L (ref 17–63)
ANION GAP: 15 (ref 5–15)
AST: 50 U/L — AB (ref 15–41)
Albumin: 3.7 g/dL (ref 3.5–5.0)
BILIRUBIN TOTAL: 0.9 mg/dL (ref 0.3–1.2)
BUN: 34 mg/dL — AB (ref 6–20)
CALCIUM: 8.3 mg/dL — AB (ref 8.9–10.3)
CO2: 20 mmol/L — ABNORMAL LOW (ref 22–32)
Chloride: 102 mmol/L (ref 101–111)
Creatinine, Ser: 6.33 mg/dL — ABNORMAL HIGH (ref 0.61–1.24)
GFR calc Af Amer: 10 mL/min — ABNORMAL LOW (ref >60)
GFR, EST NON AFRICAN AMERICAN: 8 mL/min — AB (ref >60)
GLUCOSE: 65 mg/dL (ref 65–99)
POTASSIUM: 5.8 mmol/L — AB (ref 3.5–5.1)
Sodium: 137 mmol/L (ref 135–145)
Total Protein: 7.7 g/dL (ref 6.5–8.1)

## 2016-04-29 LAB — TROPONIN I: TROPONIN I: 0.1 ng/mL — AB (ref <0.03)

## 2016-04-29 LAB — BRAIN NATRIURETIC PEPTIDE

## 2016-04-29 MED ORDER — NITROGLYCERIN 2 % TD OINT
1.0000 [in_us] | TOPICAL_OINTMENT | Freq: Once | TRANSDERMAL | Status: AC
  Administered 2016-04-29: 1 [in_us] via TOPICAL
  Filled 2016-04-29: qty 1

## 2016-04-29 MED ORDER — IPRATROPIUM-ALBUTEROL 0.5-2.5 (3) MG/3ML IN SOLN
3.0000 mL | Freq: Once | RESPIRATORY_TRACT | Status: AC
  Administered 2016-04-29: 3 mL via RESPIRATORY_TRACT
  Filled 2016-04-29: qty 3

## 2016-04-29 MED ORDER — FUROSEMIDE 10 MG/ML IJ SOLN
40.0000 mg | Freq: Once | INTRAMUSCULAR | Status: AC
  Administered 2016-04-29: 40 mg via INTRAVENOUS
  Filled 2016-04-29: qty 4

## 2016-04-29 MED ORDER — ASPIRIN 81 MG PO CHEW
324.0000 mg | CHEWABLE_TABLET | Freq: Once | ORAL | Status: AC
  Administered 2016-04-29: 324 mg via ORAL
  Filled 2016-04-29: qty 4

## 2016-04-29 NOTE — ED Triage Notes (Signed)
Pt is HD patient with last treatment yesterday.  Pt reports full treatment done yesterday.  Pt have SOB since this AM that has progressively gotten worse.  Pt 87% on RA per EMS at scene.  Pt up to 100% on 3LNC.  Pt tachypneic with exertion.  Pt ambulatory from stretcher to bed.

## 2016-04-29 NOTE — H&P (Signed)
[facility] [place] NAME: [name]    MR#:  [id number]  DATE OF BIRTH:  04/25/1955  DATE OF ADMISSION:  04/29/2016  PRIMARY CARE PHYSICIAN: [place]   REQUESTING/REFERRING PHYSICIAN: Marcelene Butte, MD  CHIEF COMPLAINT:   Chief Complaint  Patient presents with  . Shortness of Breath    HISTORY OF PRESENT ILLNESS:  [name]  is a 61 y.o. male who presents with Increasing shortness of breath for the last 2-3 days. Patient states he is also having significant lower extremity edema. He is an end-stage renal disease patient on dialysis with a significant history of congestive heart failure. He is to be in acute heart failure exacerbation tonight, chest x-ray is consistent with the same. Hospitalists were called for admission  PAST MEDICAL HISTORY:   Past Medical History:  Diagnosis Date  . Diabetes mellitus without complication ([place])   . ESRD (end stage renal disease) ([place])   . HTN (hypertension) 08/02/2015  . Peripheral vascular disease ([place])     PAST SURGICAL HISTORY:   Past Surgical History:  Procedure Laterality Date  . A/V SHUNT INTERVENTION N/A 04/06/2016   Procedure: A/V Shunt Intervention;  Surgeon: Katha Cabal, MD;  Location: [facility];  Service: Cardiovascular;  Laterality: N/A;  . A/V SHUNTOGRAM Left 04/06/2016   Procedure: A/V Fistulagram;  Surgeon: Katha Cabal, MD;  Location: [facility];  Service: Cardiovascular;  Laterality: Left;  . AV FISTULA PLACEMENT Left 2014  . PERIPHERAL VASCULAR CATHETERIZATION N/A 10/03/2014   Procedure: A/V Shuntogram/Fistulagram;  Surgeon: Algernon Huxley, MD;  Location: [facility];  Service: Cardiovascular;  Laterality: N/A;  . PERIPHERAL VASCULAR CATHETERIZATION Left 10/03/2014   Procedure: A/V Shunt Intervention;  Surgeon: Algernon Huxley, MD;  Location: [facility];  Service: Cardiovascular;  Laterality: Left;  . PERIPHERAL  VASCULAR CATHETERIZATION Left 05/01/2015   Procedure: A/V Shuntogram/Fistulagram;  Surgeon: Algernon Huxley, MD;  Location: [facility];  Service: Cardiovascular;  Laterality: Left;  . PERIPHERAL VASCULAR CATHETERIZATION N/A 05/01/2015   Procedure: A/V Shunt Intervention;  Surgeon: Algernon Huxley, MD;  Location: [facility];  Service: Cardiovascular;  Laterality: N/A;    SOCIAL HISTORY:   Social History  Substance Use Topics  . Smoking status: Current Some Day Smoker    Packs/day: 0.25    Years: 40.00    Types: Cigarettes  . Smokeless tobacco: Never Used  . Alcohol use No    FAMILY HISTORY:   Family History  Problem Relation Age of Onset  . Hypertension Son   . Diabetes Son   . Hypertension Mother   . Diabetes Sister     DRUG ALLERGIES:   Allergies  Allergen Reactions  . Sulfa Antibiotics Rash    MEDICATIONS AT HOME:   Prior to Admission medications   Medication Sig Start Date End Date Taking? Authorizing Provider  amLODipine (NORVASC) 10 MG tablet Take 10 mg by mouth daily.   Yes Historical Provider, MD  calcium acetate (PHOSLO) 667 MG capsule Take 1,334-2,001 mg by mouth 3 (three) times daily with meals. Take 2001 mgs with meals 3 times daily and 1334 with snacks 12/11/15  Yes Historical Provider, MD  Calcium Carbonate Antacid (TUMS ULTRA 1000 PO) Take 2 tablets by mouth 3 (three) times daily before meals.   Yes Historical Provider, MD  cinacalcet (SENSIPAR) 90 MG tablet Take 90 mg by mouth daily.   Yes  Historical Provider, MD  hydrALAZINE (APRESOLINE) 25 MG tablet Take 25 mg by mouth 3 (three) times daily.   Yes Historical Provider, MD  irbesartan (AVAPRO) 300 MG tablet Take 1 tablet (300 mg total) by mouth every evening. 08/03/15  Yes Sital Mody, MD  lidocaine-prilocaine (EMLA) cream Apply 1 application topically as needed (dialysis access).   Yes Historical Provider, MD  loratadine (CLARITIN) 10 MG tablet Take 10 mg by mouth daily as needed for itching.    Yes  Historical Provider, MD  oxyCODONE-acetaminophen (ROXICET) 5-325 MG tablet Take 1-2 tablets by mouth every 6 (six) hours as needed for moderate pain or severe pain. 04/06/16  Yes Katha Cabal, MD  guaiFENesin-dextromethorphan (ROBITUSSIN DM) 100-10 MG/5ML syrup Take 5 mLs by mouth every 4 (four) hours as needed for cough (chest congestion). Patient not taking: Reported on 04/05/2016 02/28/16   Theodoro Grist, MD  Ipratropium-Albuterol (COMBIVENT RESPIMAT) 20-100 MCG/ACT AERS respimat Inhale 1 puff into the lungs every 6 (six) hours. Patient not taking: Reported on 04/05/2016 02/28/16   Theodoro Grist, MD  levofloxacin (LEVAQUIN) 500 MG tablet Take 1 tablet (500 mg total) by mouth every other day. Patient not taking: Reported on 04/05/2016 02/28/16   Theodoro Grist, MD    REVIEW OF SYSTEMS:  Review of Systems  Constitutional: Negative for chills, fever, malaise/fatigue and weight loss.  HENT: Negative for ear pain, hearing loss and tinnitus.   Eyes: Negative for blurred vision, double vision, pain and redness.  Respiratory: Positive for cough and shortness of breath. Negative for hemoptysis.   Cardiovascular: Positive for leg swelling. Negative for chest pain, palpitations and orthopnea.  Gastrointestinal: Negative for abdominal pain, constipation, diarrhea, nausea and vomiting.  Genitourinary: Negative for dysuria, frequency and hematuria.  Musculoskeletal: Negative for back pain, joint pain and neck pain.  Skin:       No acne, rash, or lesions  Neurological: Negative for dizziness, tremors, focal weakness and weakness.  Endo/Heme/Allergies: Negative for polydipsia. Does not bruise/bleed easily.  Psychiatric/Behavioral: Negative for depression. The patient is not nervous/anxious and does not have insomnia.      VITAL SIGNS:   Vitals:   04/29/16 1919 04/29/16 1921 04/29/16 1922  BP:  (!) 144/105   Resp:  (!) 24   Temp: 97.8 F (36.6 C)    TempSrc: Oral    SpO2:  95%   Weight:   77.1 kg  (170 lb)  Height:   5\' 7"  (1.702 m)   Wt Readings from Last 3 Encounters:  04/29/16 77.1 kg (170 lb)  04/06/16 78 kg (172 lb)  02/28/16 78.1 kg (172 lb 2.9 oz)    PHYSICAL EXAMINATION:  Physical Exam  Vitals reviewed. Constitutional: He is oriented to person, place, and time. He appears well-developed and well-nourished. No distress.  HENT:  Head: Normocephalic and atraumatic.  Mouth/Throat: Oropharynx is clear and moist.  Eyes: Conjunctivae and EOM are normal. Pupils are equal, round, and reactive to light. No scleral icterus.  Neck: Normal range of motion. Neck supple. No JVD present. No thyromegaly present.  Cardiovascular: Normal rate, regular rhythm and intact distal pulses.  Exam reveals no gallop and no friction rub.   No murmur heard. Respiratory: He is in respiratory distress (mild). He has no wheezes. He has rales.  GI: Soft. Bowel sounds are normal. He exhibits no distension. There is no tenderness.  Musculoskeletal: Normal range of motion. He exhibits edema.  No arthritis, no gout  Lymphadenopathy:    He has no cervical adenopathy.  Neurological: He is alert and oriented to person, place, and time. No cranial nerve deficit.  No dysarthria, no aphasia  Skin: Skin is warm and dry. No rash noted. No erythema.  Psychiatric: He has a normal mood and affect. His behavior is normal. Judgment and thought content normal.    LABORATORY PANEL:   CBC  Recent Labs Lab 04/29/16 1926  WBC 5.6  HGB 12.3*  HCT 38.2*  PLT 163   ------------------------------------------------------------------------------------------------------------------  Chemistries   Recent Labs Lab 04/29/16 1926  NA 137  K 5.8*  CL 102  CO2 20*  GLUCOSE 65  BUN 34*  CREATININE 6.33*  CALCIUM 8.3*  AST 50*  ALT 38  ALKPHOS 76  BILITOT 0.9   ------------------------------------------------------------------------------------------------------------------  Cardiac Enzymes  Recent  Labs Lab 04/29/16 1926  TROPONINI 0.10*   ------------------------------------------------------------------------------------------------------------------  RADIOLOGY:  Dg Chest 2 View  Result Date: 04/29/2016 CLINICAL DATA:  Shortness of breath EXAM: CHEST  2 VIEW COMPARISON:  02/26/2016 FINDINGS: Right-sided central venous catheter has been inserted and the tip overlies the SVC. There is cardiomegaly with mild central congestion. No large effusion. No focal consolidation. Atherosclerosis. No pneumothorax. IMPRESSION: Cardiomegaly with mild central vascular congestion. Electronically Signed   By: Donavan Foil M.D.   On: 04/29/2016 20:00    EKG:   Orders placed or performed during the hospital encounter of 04/29/16  . EKG 12-Lead  . EKG 12-Lead    IMPRESSION AND PLAN:  Principal Problem:   Acute on chronic combined systolic and diastolic CHF (congestive heart failure) ([place]) - patient does still make some amount of urine, he was given IV Lasix in the ED as a temporizing measure. He will likely need hemodialysis to truly help balance his fluid status. Cardiology was consulted Active Problems:   ESRD on dialysis [facility]) - nephrology consult for dialysis support, continue home phos binders   HTN (hypertension) - continue home meds   Diabetes ([place]) - sliding scale insulin with corresponding glucose checks  All the records are reviewed and case discussed with ED provider. Management plans discussed with the patient and/or family.  DVT PROPHYLAXIS: SubQ heparin  GI PROPHYLAXIS: None  ADMISSION STATUS: Inpatient  CODE STATUS: Full Code Status History    Date Active Date Inactive Code Status Order ID Comments User Context   02/26/2016  9:07 PM 02/28/2016  6:04 PM Full Code [id number]  Lance Coon, MD Inpatient   01/07/2016  1:32 AM 01/07/2016  5:24 PM Full Code [id number]  Harvie Bridge, DO ED   08/02/2015 11:16 PM 08/04/2015 11:11 PM Full Code [id number]  Quintella Baton, MD Inpatient    12/02/2014  8:52 PM 12/04/2014  5:53 PM Full Code [id number]  Vaughan Basta, MD Inpatient   11/10/2014 11:23 PM 11/12/2014  3:06 PM Full Code [id number]  Gladstone Lighter, MD Inpatient      TOTAL TIME TAKING CARE OF THIS PATIENT: 45 minutes.    Lelynd Poer Foss 04/29/2016, 10:09 PM  [facility]  Office  [phone]  CC: Primary care physician; [place]

## 2016-04-29 NOTE — ED Provider Notes (Signed)
Time Seen: Approximately 1940  I have reviewed the triage notes  Chief Complaint: Shortness of Breath   History of Present Illness: [name] is a 61 y.o. male  who presents with a history of hemodialysis. Patient states that he had dialysis Monday, Wednesday, Friday. Patient states he had a full dialysis treatment yesterday. He states he had some shortness of breath that started this morning is Progressively worse. He is also noted some increased swelling in both of his lower extremities. He is a poor historian describes "" a little bit "" of chest pain. Patient has what appears to be a chronically elevated troponin and review of all of his previous lab. Patient was found to be hypoxic per EMS of 87% on room air he was placed on 3 L nasal cannula and 100%.   Past Medical History:  Diagnosis Date  . Diabetes mellitus without complication ([place])   . ESRD (end stage renal disease) ([place])   . HTN (hypertension) 08/02/2015  . Peripheral vascular disease [facility])     Patient Active Problem List   Diagnosis Date Noted  . Sepsis ([place]) 02/26/2016  . HCAP (healthcare-associated pneumonia) 02/26/2016  . Diabetes ([place]) 02/26/2016  . Community acquired pneumonia 01/07/2016  . Renal dialysis device, implant, or graft complication 49/67/5916  . Tobacco use 09/04/2015  . ESRD on dialysis ([place]) 08/02/2015  . Fluid overload 08/02/2015  . HTN (hypertension) 08/02/2015  . Acute on chronic combined systolic and diastolic CHF (congestive heart failure) ([place]) 11/10/2014    Past Surgical History:  Procedure Laterality Date  . A/V SHUNT INTERVENTION N/A 04/06/2016   Procedure: A/V Shunt Intervention;  Surgeon: Katha Cabal, MD;  Location: [place] [facility];  Service: Cardiovascular;  Laterality: N/A;  . A/V SHUNTOGRAM Left 04/06/2016   Procedure: A/V Fistulagram;  Surgeon: Katha Cabal, MD;  Location: [place] [facility];  Service: Cardiovascular;  Laterality: Left;  . AV FISTULA  PLACEMENT Left 2014  . PERIPHERAL VASCULAR CATHETERIZATION N/A 10/03/2014   Procedure: A/V Shuntogram/Fistulagram;  Surgeon: Algernon Huxley, MD;  Location: [place] [facility];  Service: Cardiovascular;  Laterality: N/A;  . PERIPHERAL VASCULAR CATHETERIZATION Left 10/03/2014   Procedure: A/V Shunt Intervention;  Surgeon: Algernon Huxley, MD;  Location: [place] [facility];  Service: Cardiovascular;  Laterality: Left;  . PERIPHERAL VASCULAR CATHETERIZATION Left 05/01/2015   Procedure: A/V Shuntogram/Fistulagram;  Surgeon: Algernon Huxley, MD;  Location: [place] [facility];  Service: Cardiovascular;  Laterality: Left;  . PERIPHERAL VASCULAR CATHETERIZATION N/A 05/01/2015   Procedure: A/V Shunt Intervention;  Surgeon: Algernon Huxley, MD;  Location: [place] [facility];  Service: Cardiovascular;  Laterality: N/A;    Past Surgical History:  Procedure Laterality Date  . A/V SHUNT INTERVENTION N/A 04/06/2016   Procedure: A/V Shunt Intervention;  Surgeon: Katha Cabal, MD;  Location: [place] [facility];  Service: Cardiovascular;  Laterality: N/A;  . A/V SHUNTOGRAM Left 04/06/2016   Procedure: A/V Fistulagram;  Surgeon: Katha Cabal, MD;  Location: [place] [facility];  Service: Cardiovascular;  Laterality: Left;  . AV FISTULA PLACEMENT Left 2014  . PERIPHERAL VASCULAR CATHETERIZATION N/A 10/03/2014   Procedure: A/V Shuntogram/Fistulagram;  Surgeon: Algernon Huxley, MD;  Location: [place] [facility];  Service: Cardiovascular;  Laterality: N/A;  . PERIPHERAL VASCULAR CATHETERIZATION Left 10/03/2014   Procedure: A/V Shunt Intervention;  Surgeon: Algernon Huxley, MD;  Location: [place] [facility];  Service: Cardiovascular;  Laterality: Left;  . PERIPHERAL VASCULAR  CATHETERIZATION Left 05/01/2015   Procedure: A/V Shuntogram/Fistulagram;  Surgeon: Algernon Huxley, MD;  Location: [place] [facility];  Service: Cardiovascular;  Laterality: Left;  . PERIPHERAL VASCULAR CATHETERIZATION N/A 05/01/2015   Procedure: A/V Shunt  Intervention;  Surgeon: Algernon Huxley, MD;  Location: [place] [facility];  Service: Cardiovascular;  Laterality: N/A;    Current Outpatient Rx  . Order #: [id number] Class: Historical Med  . Order #: [id number] Class: Historical Med  . Order #: [id number] Class: Historical Med  . Order #: [id number] Class: Historical Med  . Order #: [id number] Class: Historical Med  . Order #: [id number] Class: Normal  . Order #: [id number] Class: Historical Med  . Order #: [id number] Class: Historical Med  . Order #: [id number] Class: Print  . Order #: [id number] Class: Normal  . Order #: [id number] Class: Normal  . Order #: [id number] Class: Normal    Allergies:  Sulfa antibiotics  Family History: Family History  Problem Relation Age of Onset  . Hypertension Son   . Diabetes Son   . Hypertension Mother   . Diabetes Sister     Social History: Social History  Substance Use Topics  . Smoking status: Current Some Day Smoker    Packs/day: 0.25    Years: 40.00    Types: Cigarettes  . Smokeless tobacco: Never Used  . Alcohol use No     Review of Systems:   10 point review of systems was performed and was otherwise negative:  Constitutional: No fever Eyes: No visual disturbances ENT: No sore throat, ear pain Cardiac: No chest pain Respiratory: Increasing shortness of breath throughout the day with associated orthopnea . Abdomen: No abdominal pain, no vomiting, No diarrhea Endocrine: No weight loss, No night sweats Extremities: No peripheral edema, cyanosis Skin: No rashes, easy bruising Neurologic: No focal weakness, trouble with speech or swollowing Urologic: No dysuria, Hematuria, or urinary frequency   Physical Exam:  ED Triage Vitals  Enc Vitals Group     BP 04/29/16 1921 (!) 144/105     Pulse --      Resp 04/29/16 1921 (!) 24     Temp 04/29/16 1919 97.8 F (36.6 C)     Temp Source 04/29/16 1919 Oral     SpO2 04/29/16 1921 95 %     Weight 04/29/16 1922 170 lb (77.1 kg)     Height 04/29/16  1922 5\' 7"  (1.702 m)     Head Circumference --      Peak Flow --      Pain Score --      Pain Loc --      Pain Edu? --      Excl. in [place]? --     General: Awake , Alert , and Oriented times 3; GCS 15 Head: Normal cephalic , atraumatic Eyes: Pupils equal , round, reactive to light Nose/Throat: No nasal drainage, patent upper airway without erythema or exudate.  Neck: Supple, Full range of motion, No anterior adenopathy or palpable thyroid masses Lungs: Clear to ascultation without wheezes , rhonchi, or rales Heart: Regular rate, regular rhythm without murmurs , gallops , or rubs Abdomen: Soft, non tender without rebound, guarding , or rigidity; bowel sounds positive and symmetric in all 4 quadrants. No organomegaly .        Extremities: Bilateral circumferential pitting edema without clubbing or cyanosis. Neurologic: normal ambulation, Motor symmetric without deficits, sensory intact Skin: warm, dry, no rashes   Labs:   All laboratory work was reviewed including any pertinent negatives or positives listed below:  Labs Reviewed  COMPREHENSIVE METABOLIC PANEL - Abnormal; Notable for the following:       Result Value   Potassium 5.8 (*)    CO2 20 (*)    BUN 34 (*)    Creatinine, Ser 6.33 (*)    Calcium 8.3 (*)    AST 50 (*)    GFR calc non Af Amer 8 (*)    GFR calc Af Amer 10 (*)    All other components within normal limits  CBC WITH DIFFERENTIAL/PLATELET - Abnormal; Notable for the following:    RBC 4.12 (*)    Hemoglobin 12.3 (*)    HCT 38.2 (*)    RDW 18.3 (*)    Lymphs Abs 0.8 (*)    All other components within normal limits  TROPONIN I - Abnormal; Notable for the following:    Troponin I 0.10 (*)    All other components within normal limits  BRAIN NATRIURETIC PEPTIDE - Abnormal; Notable for the following:    B Natriuretic Peptide >4,500.0 (*)    All other components within normal limits  Patient's laboratory work appears to be consistent with his previous lab  abnormalities.  EKG: * ED ECG REPORT I, Daymon Larsen, the attending physician, personally viewed and interpreted this ECG.  Date: 04/29/2016 EKG Time:1911 Rate: 81 Rhythm: normal sinus rhythm QRS Axis: normal Intervals: normal ST/T Wave abnormalities: normal Conduction Disturbances: none Narrative Interpretation: unremarkable Left ventricular hypertrophy   Radiology: * "Dg Chest 2 View  Result Date: 04/29/2016 CLINICAL DATA:  Shortness of breath EXAM: CHEST  2 VIEW COMPARISON:  02/26/2016 FINDINGS: Right-sided central venous catheter has been inserted and the tip overlies the SVC. There is cardiomegaly with mild central congestion. No large effusion. No focal consolidation. Atherosclerosis. No pneumothorax. IMPRESSION: Cardiomegaly with mild central vascular congestion. Electronically Signed   By: Donavan Foil M.D.   On: 04/29/2016 20:00  "  I personally reviewed the radiologic studies   P ED Course: *  given the patient's current clinical presentation and objective findings appears he has some mild congestive heart failure associated with his end-stage renal disease. Patient states he does make some urine and was started on diuretic therapy and had nitroglycerin paste applied. His pulse ox is remained stable with supplemental oxygen does not appear to be any current respiratory distress though require further inpatient observation and likely dialysis in the morning.     Assessment:  Acute on chronic pulmonary edema History of renal failure Chronically elevated troponin   Final Clinical Impression: *  Final diagnoses:  Acute on chronic diastolic congestive heart failure [facility])     Plan:  Inpatient           Daymon Larsen, MD 04/29/16 2309

## 2016-04-30 ENCOUNTER — Inpatient Hospital Stay
Admit: 2016-04-30 | Discharge: 2016-04-30 | Disposition: A | Discharge status: Home / Self Care | Payer: Medicare Other | Attending: Internal Medicine | Admitting: Internal Medicine

## 2016-04-30 LAB — CBC
HCT: 34.3 % — ABNORMAL LOW (ref 40.0–52.0)
Hemoglobin: 11.1 g/dL — ABNORMAL LOW (ref 13.0–18.0)
MCH: 29.1 pg (ref 26.0–34.0)
MCHC: 32.2 g/dL (ref 32.0–36.0)
MCV: 90.3 fL (ref 80.0–100.0)
PLATELETS: 156 10*3/uL (ref 150–440)
RBC: 3.8 MIL/uL — AB (ref 4.40–5.90)
RDW: 18.4 % — ABNORMAL HIGH (ref 11.5–14.5)
WBC: 4.4 10*3/uL (ref 3.8–10.6)

## 2016-04-30 LAB — BASIC METABOLIC PANEL
Anion gap: 12 (ref 5–15)
BUN: 45 mg/dL — ABNORMAL HIGH (ref 6–20)
CALCIUM: 7.8 mg/dL — AB (ref 8.9–10.3)
CO2: 19 mmol/L — AB (ref 22–32)
CREATININE: 7.27 mg/dL — AB (ref 0.61–1.24)
Chloride: 103 mmol/L (ref 101–111)
GFR calc non Af Amer: 7 mL/min — ABNORMAL LOW (ref >60)
GFR, EST AFRICAN AMERICAN: 8 mL/min — AB (ref >60)
GLUCOSE: 87 mg/dL (ref 65–99)
Potassium: 5.2 mmol/L — ABNORMAL HIGH (ref 3.5–5.1)
Sodium: 134 mmol/L — ABNORMAL LOW (ref 135–145)

## 2016-04-30 LAB — GLUCOSE, CAPILLARY
GLUCOSE-CAPILLARY: 118 mg/dL — AB (ref 65–99)
Glucose-Capillary: 113 mg/dL — ABNORMAL HIGH (ref 65–99)
Glucose-Capillary: 127 mg/dL — ABNORMAL HIGH (ref 65–99)
Glucose-Capillary: 45 mg/dL — ABNORMAL LOW (ref 65–99)
Glucose-Capillary: 96 mg/dL (ref 65–99)

## 2016-04-30 LAB — TROPONIN I
TROPONIN I: 0.1 ng/mL — AB (ref <0.03)
TROPONIN I: 0.1 ng/mL — AB (ref <0.03)
Troponin I: 0.12 ng/mL (ref <0.03)

## 2016-04-30 MED ORDER — ORAL CARE MOUTH RINSE: 15.0000 mL | Freq: Two times a day (BID) | OROMUCOSAL | Status: DC

## 2016-04-30 MED ORDER — CHLORHEXIDINE GLUCONATE 0.12 % MT SOLN
15.0000 mL | Freq: Two times a day (BID) | OROMUCOSAL | Status: DC
  Administered 2016-05-01 (×2): 15 mL via OROMUCOSAL
  Filled 2016-04-30: qty 15

## 2016-04-30 MED ORDER — CALCIUM ACETATE (PHOS BINDER) 667 MG PO CAPS
1334.0000 mg | ORAL_CAPSULE | Freq: Three times a day (TID) | ORAL | Status: DC
  Filled 2016-04-30: qty 2

## 2016-04-30 MED ORDER — INSULIN ASPART 100 UNIT/ML ~~LOC~~ SOLN: 0.0000 [IU] | Freq: Three times a day (TID) | SUBCUTANEOUS | Status: DC

## 2016-04-30 MED ORDER — CINACALCET HCL 30 MG PO TABS
90.0000 mg | ORAL_TABLET | Freq: Every day | ORAL | Status: DC
  Administered 2016-05-01: 90 mg via ORAL
  Filled 2016-04-30 (×2): qty 3

## 2016-04-30 MED ORDER — IRBESARTAN 150 MG PO TABS
300.0000 mg | ORAL_TABLET | Freq: Every evening | ORAL | Status: DC
  Filled 2016-04-30: qty 2

## 2016-04-30 MED ORDER — FUROSEMIDE 10 MG/ML IJ SOLN
80.0000 mg | Freq: Once | INTRAMUSCULAR | Status: AC
  Administered 2016-04-30: 80 mg via INTRAVENOUS
  Filled 2016-04-30: qty 8

## 2016-04-30 MED ORDER — OXYCODONE-ACETAMINOPHEN 5-325 MG PO TABS: 1.0000 | ORAL_TABLET | Freq: Four times a day (QID) | ORAL | Status: DC | PRN

## 2016-04-30 MED ORDER — AMLODIPINE BESYLATE 10 MG PO TABS
10.0000 mg | ORAL_TABLET | Freq: Every day | ORAL | Status: DC
  Filled 2016-04-30: qty 1

## 2016-04-30 MED ORDER — SODIUM CHLORIDE 0.9% FLUSH
3.0000 mL | Freq: Two times a day (BID) | INTRAVENOUS | Status: DC
  Administered 2016-04-30 – 2016-05-01 (×3): 3 mL via INTRAVENOUS

## 2016-04-30 MED ORDER — HEPARIN SODIUM (PORCINE) 5000 UNIT/ML IJ SOLN
5000.0000 [IU] | Freq: Three times a day (TID) | INTRAMUSCULAR | Status: DC
  Administered 2016-04-30 – 2016-05-01 (×3): 5000 [IU] via SUBCUTANEOUS
  Filled 2016-04-30 (×3): qty 1

## 2016-04-30 MED ORDER — HYDRALAZINE HCL 25 MG PO TABS
25.0000 mg | ORAL_TABLET | Freq: Three times a day (TID) | ORAL | Status: DC
  Administered 2016-05-01 (×2): 25 mg via ORAL
  Filled 2016-04-30 (×3): qty 1

## 2016-04-30 MED ORDER — CALCIUM ACETATE (PHOS BINDER) 667 MG PO CAPS
2001.0000 mg | ORAL_CAPSULE | Freq: Three times a day (TID) | ORAL | Status: DC
  Administered 2016-04-30 – 2016-05-01 (×3): 2001 mg via ORAL
  Filled 2016-04-30 (×2): qty 3

## 2016-04-30 MED ORDER — INSULIN ASPART 100 UNIT/ML ~~LOC~~ SOLN: 0.0000 [IU] | Freq: Every day | SUBCUTANEOUS | Status: DC

## 2016-04-30 MED ORDER — ACETAMINOPHEN 325 MG PO TABS: 650.0000 mg | ORAL_TABLET | Freq: Four times a day (QID) | ORAL | Status: DC | PRN

## 2016-04-30 MED ORDER — ONDANSETRON HCL 4 MG/2ML IJ SOLN: 4.0000 mg | Freq: Four times a day (QID) | INTRAMUSCULAR | Status: DC | PRN

## 2016-04-30 MED ORDER — CALCIUM ACETATE (PHOS BINDER) 667 MG PO CAPS: 1334.0000 mg | ORAL_CAPSULE | ORAL | Status: DC

## 2016-04-30 MED ORDER — ONDANSETRON HCL 4 MG PO TABS: 4.0000 mg | ORAL_TABLET | Freq: Four times a day (QID) | ORAL | Status: DC | PRN

## 2016-04-30 MED ORDER — CALCIUM CARBONATE ANTACID 500 MG PO CHEW
2.0000 | CHEWABLE_TABLET | Freq: Three times a day (TID) | ORAL | Status: DC
Start: 2016-04-30 — End: 2016-05-01
  Administered 2016-04-30 – 2016-05-01 (×4): 400 mg via ORAL
  Filled 2016-04-30 (×3): qty 2

## 2016-04-30 MED ORDER — ACETAMINOPHEN 650 MG RE SUPP: 650.0000 mg | Freq: Four times a day (QID) | RECTAL | Status: DC | PRN

## 2016-04-30 NOTE — Progress Notes (Signed)
Hemodialysis completed. 

## 2016-04-30 NOTE — Care Management (Signed)
Admitted to [facility] with the diagnosis acute/chronic CHF. Lives alone. Son is [employer] [phone]). Goes to [employer]. "Been awhile since I seen a physician there." Goes to [employer] on [address] x 5 years. Uses [employer] and [place] for transportation. No equipment in the home. Takes care of all basic activities of daily living himself, doesn't drive. Prescriptions are filled at [facility] No falls. Appetite comes and goes. No home health, skilled nursing, or home oxygen. Unsure as to who will transport. Shelbie Ammons RN MSN CCM Care Management

## 2016-04-30 NOTE — Progress Notes (Signed)
Subjective:    patient presents from home for SOB CXR shows cardiomegaly with congestion Patient reports dyspnea on exertion Nephrology consult requested to dialyze patient  Objective:  Vital signs in last 24 hours:  Temp:  [97.8 F (36.6 C)-98.2 F (36.8 C)] 97.9 F (36.6 C) (03/09 1236) Pulse Rate:  [73-77] 77 (03/09 1236) Resp:  [14-24] 16 (03/09 1236) BP: (142-152)/(89-105) 152/95 (03/09 1236) SpO2:  [93 %-100 %] 93 % (03/09 1236) Weight:  [77.1 kg (170 lb)-78.2 kg (172 lb 6.4 oz)] 78.2 kg (172 lb 6.4 oz) (03/09 0018)  Weight change:  Filed Weights   04/29/16 1922 04/30/16 0018  Weight: 77.1 kg (170 lb) 78.2 kg (172 lb 6.4 oz)    Intake/Output:    Intake/Output Summary (Last 24 hours) at 04/30/16 1512 Last data filed at 04/30/16 0900  Gross per 24 hour  Intake              240 ml  Output                0 ml  Net              240 ml     Physical Exam: General: NAD, laying in bed  HEENT anicteric  Neck supple  Pulm/lungs Coarse crackles at bases, normal effort  CVS/Heart Regular, no rub  Abdomen:  Soft, NT  Extremities: Trace to 1+ b/l edema  Neurologic: Alert, oriented  Skin: No acute rashes  Access: Rt IJ PC, aneurysmal AVF       Basic Metabolic Panel:   Recent Labs Lab 04/29/16 1926 04/30/16 0609  NA 137 134*  K 5.8* 5.2*  CL 102 103  CO2 20* 19*  GLUCOSE 65 87  BUN 34* 45*  CREATININE 6.33* 7.27*  CALCIUM 8.3* 7.8*     CBC:  Recent Labs Lab 04/29/16 1926 04/30/16 0609  WBC 5.6 4.4  NEUTROABS 3.9  --   HGB 12.3* 11.1*  HCT 38.2* 34.3*  MCV 92.6 90.3  PLT 163 156      Microbiology:  No results found for this or any previous visit (from the past 720 hour(s)).  Coagulation Studies: No results for input(s): LABPROT, INR in the last 72 hours.  Urinalysis: No results for input(s): COLORURINE, LABSPEC, PHURINE, GLUCOSEU, HGBUR, BILIRUBINUR, KETONESUR, PROTEINUR, UROBILINOGEN, NITRITE, LEUKOCYTESUR in the last 72  hours.  Invalid input(s): APPERANCEUR    Imaging: Dg Chest 2 View  Result Date: 04/29/2016 CLINICAL DATA:  Shortness of breath EXAM: CHEST  2 VIEW COMPARISON:  02/26/2016 FINDINGS: Right-sided central venous catheter has been inserted and the tip overlies the SVC. There is cardiomegaly with mild central congestion. No large effusion. No focal consolidation. Atherosclerosis. No pneumothorax. IMPRESSION: Cardiomegaly with mild central vascular congestion. Electronically Signed   By: Donavan Foil M.D.   On: 04/29/2016 20:00     Medications:    . amLODipine  10 mg Oral Daily  . calcium acetate  1,334 mg Oral With snacks  . calcium acetate  2,001 mg Oral TID WC  . calcium carbonate  2 tablet Oral TID AC  . chlorhexidine  15 mL Mouth Rinse BID  . cinacalcet  90 mg Oral Daily  . heparin  5,000 Units Subcutaneous Q8H  . hydrALAZINE  25 mg Oral TID  . insulin aspart  0-5 Units Subcutaneous QHS  . insulin aspart  0-9 Units Subcutaneous TID WC  . irbesartan  300 mg Oral QPM  . mouth rinse  15 mL Mouth Rinse  [id number]  . sodium chloride flush  3 mL Intravenous Q12H   acetaminophen **OR** acetaminophen, ondansetron **OR** ondansetron (ZOFRAN) IV, oxyCODONE-acetaminophen  Assessment/ Plan:  61 y.o.AA male with hypertension, hepatitis C, ESRD started HD 10/13, anemia of CKD, admission for diarrhea/hyperkalemia 10/2012, admission for GI bleed 10/2012, SHPTH, GI bleed 3/15, seizure episode, admission for pulmonary edema 9/16.  1.  End-stage renal disease [facility], [place], [place]; MWF; [facility] Resume normal dialysis schedule  2. AOCKD Hgb 11.1 Monitor  3. SHPTH  Low phos/renal diet Continue home dose of binders  4. Chronic SOB May need pulmonary evaluation   LOS: 1 Kamir Selover 3/9/20183:12 PM

## 2016-04-30 NOTE — Consult Note (Signed)
[facility] Consultation Note  Patient ID: [name], MRN: [id number], DOB/AGE: May 10, 1955 61 y.o. Admit date: 04/29/2016   Date of Consult: 04/30/2016 Primary Physician: [place] Primary Cardiologist: [facility]  Chief Complaint:  Chief Complaint  Patient presents with  . Shortness of Breath   Reason for Consult: acute on chronic diastolic dysfunction heart failure  HPI: 61 y.o. male with known diabetes with complication end-stage renal disease on dialysis essential hypertension and peripheral vascular disease having acute on chronic diastolic dysfunction heart failure with significant lower extremity edema shortness of breath over the last several days. The patient does have dialysis although it does appear that the patient has had worsening symptoms of shortness of breath over the last few days consistent with some possible pulmonary edema. The patient has had severe hypertension for which was exacerbated as well but on appropriate medication including amlodipine hydralazine and angiotensin receptor blocker. The patient has had an echocardiogram last year for which she had normal LV systolic function with severe left ventricular hypertrophy and mild valvular heart disease. He has had an elevated troponin of 0.12 which apparently is chronically elevated and possibly secondary to diastolic dysfunction heart failure rather than acute coronary syndrome. He does have some improvements of shortness of breath today although will need further treatment with dialysis  Past Medical History:  Diagnosis Date  . Diabetes mellitus without complication ([place])   . ESRD (end stage renal disease) ([place])   . HTN (hypertension) 08/02/2015  . Peripheral vascular disease [facility])       Surgical History:  Past Surgical History:  Procedure Laterality Date  . A/V SHUNT INTERVENTION N/A 04/06/2016   Procedure: A/V Shunt Intervention;  Surgeon: Katha Cabal, MD;  Location: [place] [facility];  Service: Cardiovascular;  Laterality: N/A;  . A/V SHUNTOGRAM Left 04/06/2016   Procedure: A/V Fistulagram;  Surgeon: Katha Cabal, MD;  Location: [place] [facility];  Service: Cardiovascular;  Laterality: Left;  . AV FISTULA PLACEMENT Left 2014  . PERIPHERAL VASCULAR CATHETERIZATION N/A 10/03/2014   Procedure: A/V Shuntogram/Fistulagram;  Surgeon: Algernon Huxley, MD;  Location: [place] [facility];  Service: Cardiovascular;  Laterality: N/A;  . PERIPHERAL VASCULAR CATHETERIZATION Left 10/03/2014   Procedure: A/V Shunt Intervention;  Surgeon: Algernon Huxley, MD;  Location: [place] [facility];  Service: Cardiovascular;  Laterality: Left;  . PERIPHERAL VASCULAR CATHETERIZATION Left 05/01/2015   Procedure: A/V Shuntogram/Fistulagram;  Surgeon: Algernon Huxley, MD;  Location: [place] [facility];  Service: Cardiovascular;  Laterality: Left;  . PERIPHERAL VASCULAR CATHETERIZATION N/A 05/01/2015   Procedure: A/V Shunt Intervention;  Surgeon: Algernon Huxley, MD;  Location: [place] [facility];  Service: Cardiovascular;  Laterality: N/A;     Home Meds: Prior to Admission medications   Medication Sig Start Date End Date Taking? Authorizing Provider  amLODipine (NORVASC) 10 MG tablet Take 10 mg by mouth daily.   Yes Historical Provider, MD  calcium acetate (PHOSLO) 667 MG capsule Take 1,334-2,001 mg by mouth 3 (three) times daily with meals. Take 2001 mgs with meals 3 times daily and 1334 with snacks 12/11/15  Yes Historical Provider, MD  Calcium Carbonate Antacid (TUMS ULTRA 1000 PO) Take 2 tablets by mouth 3 (three) times daily before meals.   Yes Historical Provider, MD  cinacalcet (SENSIPAR) 90 MG tablet Take 90 mg by mouth daily.   Yes Historical Provider, MD  hydrALAZINE (APRESOLINE) 25 MG tablet Take 25 mg by mouth 3 (three) times daily.  Yes Historical Provider, MD  irbesartan (AVAPRO) 300 MG tablet Take 1 tablet (300 mg total) by mouth every evening. 08/03/15  Yes Sital Mody, MD   lidocaine-prilocaine (EMLA) cream Apply 1 application topically as needed (dialysis access).   Yes Historical Provider, MD  loratadine (CLARITIN) 10 MG tablet Take 10 mg by mouth daily as needed for itching.    Yes Historical Provider, MD  oxyCODONE-acetaminophen (ROXICET) 5-325 MG tablet Take 1-2 tablets by mouth every 6 (six) hours as needed for moderate pain or severe pain. 04/06/16  Yes Katha Cabal, MD  guaiFENesin-dextromethorphan (ROBITUSSIN DM) 100-10 MG/5ML syrup Take 5 mLs by mouth every 4 (four) hours as needed for cough (chest congestion). Patient not taking: Reported on 04/05/2016 02/28/16   Theodoro Grist, MD  Ipratropium-Albuterol (COMBIVENT RESPIMAT) 20-100 MCG/ACT AERS respimat Inhale 1 puff into the lungs every 6 (six) hours. Patient not taking: Reported on 04/05/2016 02/28/16   Theodoro Grist, MD  levofloxacin (LEVAQUIN) 500 MG tablet Take 1 tablet (500 mg total) by mouth every other day. Patient not taking: Reported on 04/05/2016 02/28/16   Theodoro Grist, MD    Inpatient Medications:  . amLODipine  10 mg Oral Daily  . calcium acetate  1,334-2,001 mg Oral TID WC  . calcium carbonate  2 tablet Oral TID AC  . chlorhexidine  15 mL Mouth Rinse BID  . cinacalcet  90 mg Oral Daily  . heparin  5,000 Units Subcutaneous Q8H  . hydrALAZINE  25 mg Oral TID  . insulin aspart  0-5 Units Subcutaneous QHS  . insulin aspart  0-9 Units Subcutaneous TID WC  . irbesartan  300 mg Oral QPM  . mouth rinse  15 mL Mouth Rinse q12n4p  . sodium chloride flush  3 mL Intravenous Q12H     Allergies:  Allergies  Allergen Reactions  . Sulfa Antibiotics Rash    Social History   Social History  . Marital status: Widowed    Spouse name: N/A  . Number of children: N/A  . Years of education: N/A   Occupational History  . Not on file.   Social History Main Topics  . Smoking status: Current Some Day Smoker    Packs/day: 0.25    Years: 40.00    Types: Cigarettes  . Smokeless tobacco: Never  Used  . Alcohol use No  . Drug use: No  . Sexual activity: Not on file   Other Topics Concern  . Not on file   Social History Narrative   Lives at home by himself.   Independent on ambulation     Family History  Problem Relation Age of Onset  . Hypertension Son   . Diabetes Son   . Hypertension Mother   . Diabetes Sister      Review of Systems Positive for Shortness of breath cough congestion Negative for: General:  chills, fever, night sweats or weight changes.  Cardiovascular: Positive for PND orthopnea negative for syncope dizziness  Dermatological skin lesions rashes Respiratory: Positive for Cough congestion Urologic: Frequent urination urination at night and hematuria Abdominal: negative for nausea, vomiting, diarrhea, bright red blood per rectum, melena, or hematemesis Neurologic: negative for visual changes, and/or hearing changes  All other systems reviewed and are otherwise negative except as noted above.  Labs:  Recent Labs  04/29/16 1926 04/30/16 0020  TROPONINI 0.10* 0.12*   Lab Results  Component Value Date   WBC 5.6 04/29/2016   HGB 12.3 (L) 04/29/2016   HCT 38.2 (L) 04/29/2016  MCV 92.6 04/29/2016   PLT 163 04/29/2016    Recent Labs Lab 04/29/16 1926  NA 137  K 5.8*  CL 102  CO2 20*  BUN 34*  CREATININE 6.33*  CALCIUM 8.3*  PROT 7.7  BILITOT 0.9  ALKPHOS 76  ALT 38  AST 50*  GLUCOSE 65   Lab Results  Component Value Date   CHOL 101 11/07/2012   HDL 39 (L) 11/07/2012   LDLCALC 38 11/07/2012   TRIG 119 11/07/2012   No results found for: DDIMER  Radiology/Studies:  Dg Chest 2 View  Result Date: 04/29/2016 CLINICAL DATA:  Shortness of breath EXAM: CHEST  2 VIEW COMPARISON:  02/26/2016 FINDINGS: Right-sided central venous catheter has been inserted and the tip overlies the SVC. There is cardiomegaly with mild central congestion. No large effusion. No focal consolidation. Atherosclerosis. No pneumothorax. IMPRESSION: Cardiomegaly  with mild central vascular congestion. Electronically Signed   By: Donavan Foil M.D.   On: 04/29/2016 20:00    EKG: Normal sinus rhythm  Weights: Filed Weights   04/29/16 1922 04/30/16 0018  Weight: 77.1 kg (170 lb) 78.2 kg (172 lb 6.4 oz)     Physical Exam: Blood pressure (!) 147/97, pulse 73, temperature 98.2 F (36.8 C), temperature source Oral, resp. rate 14, height 5\' 7"  (1.702 m), weight 78.2 kg (172 lb 6.4 oz), SpO2 100 %. Body mass index is 27 kg/m. General: Well developed, well nourished, in no acute distress. Head eyes ears nose throat: Normocephalic, atraumatic, sclera non-icteric, no xanthomas, nares are without discharge. No apparent thyromegaly and/or mass  Lungs: Normal respiratory effort.  no wheezes, Few basilar rales, no rhonchi.  Heart: RRR with normal S1 S2. no murmur gallop, no rub, PMI is normal size and placement, carotid upstroke normal without bruit, jugular venous pressure is normal Abdomen: Soft, non-tender, non-distended with normoactive bowel sounds. No hepatomegaly. No rebound/guarding. No obvious abdominal masses. Abdominal aorta is normal size without bruit Extremities: Trace to 1+ edema. no cyanosis, no clubbing, no ulcers  Peripheral : 2+ bilateral upper extremity pulses, 2+ bilateral femoral pulses, 2+ bilateral dorsal pedal pulse Neuro: Alert and oriented. No facial asymmetry. No focal deficit. Moves all extremities spontaneously. Musculoskeletal: Normal muscle tone without kyphosis Psych:  Responds to questions appropriately with a normal affect.    Assessment: 61 year old male with acute on chronic diastolic dysfunction congestive heart failure secondary to end-stage renal disease diabetes and left ventricular hypertrophy slightly improved today with an elevated troponin consistent with demand ischemia rather than acute coronary syndrome  Plan: 1. Continue medication management for hypertension control and diastolic dysfunction heart failure with  above medications without change 2. Dialysis for acute on chronic diastolic dysfunction heart failure and fluid retention 3. Patient is to continue with appropriate diet to reduce the possibility of further episodes of diastolic heart failure 4. Further consideration of beta blocker if able for diastolic heart failure exacerbation 5. No further intervention of minimal elevation of troponin consistent with demand ischemia rather than acute coronary syndrome 6. Begin ambulation after dialysis and follow for improvements and possible discharge to home if doing well  Signed, Corey Skains M.D. [facility] 04/30/2016, 6:46 AM

## 2016-04-30 NOTE — Progress Notes (Addendum)
[place] [facility] [place] NAME: [name]    MR#:  [id number]  DATE OF BIRTH:  08/24/1955  SUBJECTIVE:   Patient reports improved shortness of breath. He received dialysis Monday, Tuesday and Wednesday. He thinks he may have increased fluid intake  REVIEW OF SYSTEMS:    Review of Systems  Constitutional: Negative.  Negative for chills, fever and malaise/fatigue.  HENT: Negative.  Negative for ear discharge, ear pain, hearing loss, nosebleeds and sore throat.   Eyes: Negative.  Negative for blurred vision and pain.  Respiratory: Positive for shortness of breath. Negative for cough, hemoptysis and wheezing.   Cardiovascular: Positive for leg swelling. Negative for chest pain and palpitations.  Gastrointestinal: Negative.  Negative for abdominal pain, blood in stool, diarrhea, nausea and vomiting.  Genitourinary: Negative.  Negative for dysuria.  Musculoskeletal: Negative.  Negative for back pain.  Skin: Negative.   Neurological: Negative for dizziness, tremors, speech change, focal weakness, seizures and headaches.  Endo/Heme/Allergies: Negative.  Does not bruise/bleed easily.  Psychiatric/Behavioral: Negative.  Negative for depression, hallucinations and suicidal ideas.    Tolerating Diet: yes      DRUG ALLERGIES:   Allergies  Allergen Reactions  . Sulfa Antibiotics Rash    VITALS:  Blood pressure (!) 147/97, pulse 73, temperature 98.2 F (36.8 C), temperature source Oral, resp. rate 14, height 5\' 7"  (1.702 m), weight 78.2 kg (172 lb 6.4 oz), SpO2 100 %.  PHYSICAL EXAMINATION:   Physical Exam  Constitutional: He is oriented to person, place, and time and well-developed, well-nourished, and in no distress. No distress.  HENT:  Head: Normocephalic.  Eyes: No scleral icterus.  Neck: Normal range of motion. Neck supple. No JVD present. No tracheal deviation present.  Cardiovascular: Normal rate, regular rhythm and normal heart  sounds.  Exam reveals no gallop and no friction rub.   No murmur heard. Pulmonary/Chest: Effort normal. No respiratory distress. He has no wheezes. He has rales. He exhibits no tenderness.  Abdominal: Soft. Bowel sounds are normal. He exhibits no distension and no mass. There is no tenderness. There is no rebound and no guarding.  Musculoskeletal: Normal range of motion. He exhibits edema.  Neurological: He is alert and oriented to person, place, and time.  Skin: Skin is warm. No rash noted. No erythema.  Psychiatric: Affect and judgment normal.      LABORATORY PANEL:   CBC  Recent Labs Lab 04/30/16 0609  WBC 4.4  HGB 11.1*  HCT 34.3*  PLT 156   ------------------------------------------------------------------------------------------------------------------  Chemistries   Recent Labs Lab 04/29/16 1926 04/30/16 0609  NA 137 134*  K 5.8* 5.2*  CL 102 103  CO2 20* 19*  GLUCOSE 65 87  BUN 34* 45*  CREATININE 6.33* 7.27*  CALCIUM 8.3* 7.8*  AST 50*  --   ALT 38  --   ALKPHOS 76  --   BILITOT 0.9  --    ------------------------------------------------------------------------------------------------------------------  Cardiac Enzymes  Recent Labs Lab 04/29/16 1926 04/30/16 0020 04/30/16 0609  TROPONINI 0.10* 0.12* 0.10*   ------------------------------------------------------------------------------------------------------------------  RADIOLOGY:  Dg Chest 2 View  Result Date: 04/29/2016 CLINICAL DATA:  Shortness of breath EXAM: CHEST  2 VIEW COMPARISON:  02/26/2016 FINDINGS: Right-sided central venous catheter has been inserted and the tip overlies the SVC. There is cardiomegaly with mild central congestion. No large effusion. No focal consolidation. Atherosclerosis. No pneumothorax. IMPRESSION: Cardiomegaly with mild central vascular congestion. Electronically Signed   By: Madie Reno.D.  On: 04/29/2016 20:00     ASSESSMENT AND PLAN:    61 year old  male with history of ESRD on hemodialysis, combined systolic and diastolic heart failureEF 01-74% to presents with shortness of breath.   1. Acute hypoxic respiratory failure in the setting of acute on chronic combined systolic and diastolic heart failure/ESRD Patient will require hemodialysis to remove fluid Follow-up an echo Wean oxygen 2. Elevated troponin in the setting of demand ischemia and not ACS  3. ESRD: Dialysis  Will be scheduled for today  4. Essential hypertension: Continue Norvasc hydralazine,Avapro   Management plans discussed with the patient and he is in agreement.  CODE STATUS: FULL  TOTAL TIME TAKING CARE OF THIS PATIENT: 29 minutes.     POSSIBLE D/C tomorrw, DEPENDING ON CLINICAL CONDITION.   Mehreen Azizi M.D on 04/30/2016 at 8:03 AM  Between 7am to 6pm - Pager - [phone] After 6pm go to [email or web address] - password [email or web address] [facility]  Office  [phone]  CC: Primary care physician; [facility]  Note: This dictation was prepared with Dragon dictation along with smaller phrase technology. Any transcriptional errors that result from this process are unintentional.

## 2016-04-30 NOTE — Progress Notes (Signed)
Pt arrived from ED alert and oriented. No c/o pain, no SOB. Telemetry box and skin verified with Suzzanne Cloud . No skin issues noted. CBG on admit 45, Pt asymptomatic but says he hadn't eaten in a while. Pt given apple juice, graham crackers and a [place] sandwich meal. Recheck, blood sugar at 96. Will continue to monitor, no concerns offered at this time.

## 2016-04-30 NOTE — Progress Notes (Signed)
HD STARTED  

## 2016-04-30 NOTE — Progress Notes (Signed)
Post hd tx 

## 2016-04-30 NOTE — Plan of Care (Signed)
Problem: Cardiac: Goal: Ability to achieve and maintain adequate cardiopulmonary perfusion will improve Outcome: Progressing Administering oxygen per nasal canula to maintain oxygen saturation greater than 92%  Problem: Education: Goal: Ability to demonstrate managment of disease process will improve Outcome: Progressing Review CHF education as needed.

## 2016-04-30 NOTE — Progress Notes (Signed)
*  PRELIMINARY RESULTS* Echocardiogram 2D Echocardiogram has been performed.  [name] 04/30/2016, 3:29 PM

## 2016-04-30 NOTE — Progress Notes (Signed)
Post hd tx: Tolerated 3.5hours of treatment with 2liters net fluid removal.Currently using CVC for access.CVC capped/clamped,lumens wrapped with gauze/taped.

## 2016-05-01 LAB — BASIC METABOLIC PANEL
Anion gap: 11 (ref 5–15)
BUN: 31 mg/dL — ABNORMAL HIGH (ref 6–20)
CO2: 25 mmol/L (ref 22–32)
Calcium: 8 mg/dL — ABNORMAL LOW (ref 8.9–10.3)
Chloride: 102 mmol/L (ref 101–111)
Creatinine, Ser: 4.94 mg/dL — ABNORMAL HIGH (ref 0.61–1.24)
GFR calc non Af Amer: 11 mL/min — ABNORMAL LOW (ref >60)
GFR, EST AFRICAN AMERICAN: 13 mL/min — AB (ref >60)
Glucose, Bld: 120 mg/dL — ABNORMAL HIGH (ref 65–99)
POTASSIUM: 4.1 mmol/L (ref 3.5–5.1)
SODIUM: 138 mmol/L (ref 135–145)

## 2016-05-01 LAB — HEMOGLOBIN A1C
HEMOGLOBIN A1C: 5.4 % (ref 4.8–5.6)
Mean Plasma Glucose: 108 mg/dL

## 2016-05-01 LAB — GLUCOSE, CAPILLARY
GLUCOSE-CAPILLARY: 93 mg/dL (ref 65–99)
Glucose-Capillary: 112 mg/dL — ABNORMAL HIGH (ref 65–99)

## 2016-05-01 LAB — ECHOCARDIOGRAM COMPLETE
Height: 67 in
WEIGHTICAEL: 2758.4 [oz_av]

## 2016-05-01 MED ORDER — CARVEDILOL 6.25 MG PO TABS: 6.2500 mg | ORAL_TABLET | Freq: Two times a day (BID) | ORAL | 0 refills | Status: DC

## 2016-05-01 MED ORDER — CARVEDILOL 6.25 MG PO TABS
6.2500 mg | ORAL_TABLET | Freq: Two times a day (BID) | ORAL | Status: DC
  Administered 2016-05-01: 6.25 mg via ORAL
  Filled 2016-05-01: qty 1

## 2016-05-01 NOTE — Discharge Summary (Signed)
[facility] NAME: [name]    MR#:  [id number]  DATE OF BIRTH:  10-05-1955  DATE OF ADMISSION:  04/29/2016 ADMITTING PHYSICIAN: Lance Coon, MD  DATE OF DISCHARGE: 05/01/2016  PRIMARY CARE PHYSICIAN: [facility]    ADMISSION DIAGNOSIS:  Acute on chronic diastolic congestive heart failure ([place]) [I50.33]  DISCHARGE DIAGNOSIS:  Principal Problem:   Acute on chronic combined systolic and diastolic CHF (congestive heart failure) (HCC) Active Problems:   ESRD on dialysis ([place])   HTN (hypertension)   Diabetes ([place])   SECONDARY DIAGNOSIS:   Past Medical History:  Diagnosis Date  . Diabetes mellitus without complication ([place])   . ESRD (end stage renal disease) ([place])   . HTN (hypertension) 08/02/2015  . Peripheral vascular disease [facility])     HOSPITAL COURSE:   61 year old male with history of ESRD on hemodialysis, combined systolic and diastolic heart failure EF 30-35% to presents with shortness of breath.   1. Acute hypoxic respiratory failure in the setting of acute on chronic combined systolic and diastolic heart failure/ESRD Patient required Lasix and hemodialysis to remove fluid He was weaned off of Oxygen.  2. Elevated troponin in the setting of demand ischemia and not ACS He was evaluated by Cardiology.   3. ESRD on HD: He underwent dialysis while in the hospital.  4. Essential hypertension: Continue  Hydralazine and Avapro. Norvasc was discontinued and in its place Coreg was initiated due to CHF.  5. Tobacco dependence: Patient is encouraged to quit smoking. Counseling was provided for 4 minutes. He is in the conteplative stage.  DISCHARGE CONDITIONS AND DIET:   Stable Renal diet   CONSULTS OBTAINED:  Treatment Team:  Corey Skains, MD Murlean Iba, MD  DRUG ALLERGIES:   Allergies  Allergen Reactions  . Sulfa Antibiotics Rash    DISCHARGE MEDICATIONS:   Current Discharge  Medication List    START taking these medications   Details  carvedilol (COREG) 6.25 MG tablet Take 1 tablet (6.25 mg total) by mouth 2 (two) times daily with a meal. Qty: 60 tablet, Refills: 0      CONTINUE these medications which have NOT CHANGED   Details  calcium acetate (PHOSLO) 667 MG capsule Take 1,334-2,001 mg by mouth 3 (three) times daily with meals. Take 2001 mgs with meals 3 times daily and 1334 with snacks    Calcium Carbonate Antacid (TUMS ULTRA 1000 PO) Take 2 tablets by mouth 3 (three) times daily before meals.    cinacalcet (SENSIPAR) 90 MG tablet Take 90 mg by mouth daily.    hydrALAZINE (APRESOLINE) 25 MG tablet Take 25 mg by mouth 3 (three) times daily.    irbesartan (AVAPRO) 300 MG tablet Take 1 tablet (300 mg total) by mouth every evening. Qty: 30 tablet, Refills: 0    lidocaine-prilocaine (EMLA) cream Apply 1 application topically as needed (dialysis access).    loratadine (CLARITIN) 10 MG tablet Take 10 mg by mouth daily as needed for itching.     oxyCODONE-acetaminophen (ROXICET) 5-325 MG tablet Take 1-2 tablets by mouth every 6 (six) hours as needed for moderate pain or severe pain. Qty: 50 tablet, Refills: 0    guaiFENesin-dextromethorphan (ROBITUSSIN DM) 100-10 MG/5ML syrup Take 5 mLs by mouth every 4 (four) hours as needed for cough (chest congestion). Qty: 118 mL, Refills: 0    Ipratropium-Albuterol (COMBIVENT RESPIMAT) 20-100 MCG/ACT AERS respimat Inhale 1 puff into the lungs every 6 (six) hours. Qty: 1  Inhaler, Refills: 5      STOP taking these medications     amLODipine (NORVASC) 10 MG tablet      levofloxacin (LEVAQUIN) 500 MG tablet           Today   CHIEF COMPLAINT:  SOB has improves as had LEE   VITAL SIGNS:  Blood pressure (!) 144/58, pulse 73, temperature 98.6 F (37 C), temperature source Oral, resp. rate 17, height 5\' 7"  (1.702 m), weight 75.4 kg (166 lb 4.3 oz), SpO2 96 %.   REVIEW OF SYSTEMS:  Review of Systems   Constitutional: Negative.  Negative for chills, fever and malaise/fatigue.  HENT: Negative.  Negative for ear discharge, ear pain, hearing loss, nosebleeds and sore throat.   Eyes: Negative.  Negative for blurred vision and pain.  Respiratory: Negative.  Negative for cough, hemoptysis, shortness of breath and wheezing.   Cardiovascular: Negative.  Negative for chest pain, palpitations and leg swelling.  Gastrointestinal: Negative.  Negative for abdominal pain, blood in stool, diarrhea, nausea and vomiting.  Genitourinary: Negative.  Negative for dysuria.  Musculoskeletal: Negative.  Negative for back pain.  Skin: Negative.   Neurological: Negative for dizziness, tremors, speech change, focal weakness, seizures and headaches.  Endo/Heme/Allergies: Negative.  Does not bruise/bleed easily.  Psychiatric/Behavioral: Negative.  Negative for depression, hallucinations and suicidal ideas.     PHYSICAL EXAMINATION:  GENERAL:  61 y.o.-year-old patient lying in the bed with no acute distress.  NECK:  Supple, no jugular venous distention. No thyroid enlargement, no tenderness.  LUNGS: Normal breath sounds bilaterally, no wheezing, rales,rhonchi  No use of accessory muscles of respiration.  CARDIOVASCULAR: S1, S2 normal. No murmurs, rubs, or gallops.  ABDOMEN: Soft, non-tender, non-distended. Bowel sounds present. No organomegaly or mass.  EXTREMITIES: No pedal edema, cyanosis, or clubbing.  PSYCHIATRIC: The patient is alert and oriented x 3.  SKIN: No obvious rash, lesion, or ulcer.   DATA REVIEW:   CBC  Recent Labs Lab 04/30/16 0609  WBC 4.4  HGB 11.1*  HCT 34.3*  PLT 156    Chemistries   Recent Labs Lab 04/29/16 1926  05/01/16 0515  NA 137  < > 138  K 5.8*  < > 4.1  CL 102  < > 102  CO2 20*  < > 25  GLUCOSE 65  < > 120*  BUN 34*  < > 31*  CREATININE 6.33*  < > 4.94*  CALCIUM 8.3*  < > 8.0*  AST 50*  --   --   ALT 38  --   --   ALKPHOS 76  --   --   BILITOT 0.9  --   --    < > = values in this interval not displayed.  Cardiac Enzymes  Recent Labs Lab 04/30/16 0020 04/30/16 0609 04/30/16 1212  TROPONINI 0.12* 0.10* 0.10*    Microbiology Results  @MICRORSLT48 @  RADIOLOGY:  Dg Chest 2 View  Result Date: 04/29/2016 CLINICAL DATA:  Shortness of breath EXAM: CHEST  2 VIEW COMPARISON:  02/26/2016 FINDINGS: Right-sided central venous catheter has been inserted and the tip overlies the SVC. There is cardiomegaly with mild central congestion. No large effusion. No focal consolidation. Atherosclerosis. No pneumothorax. IMPRESSION: Cardiomegaly with mild central vascular congestion. Electronically Signed   By: Donavan Foil M.D.   On: 04/29/2016 20:00      Current Discharge Medication List    START taking these medications   Details  carvedilol (COREG) 6.25 MG tablet Take 1 tablet (6.25 mg  total) by mouth 2 (two) times daily with a meal. Qty: 60 tablet, Refills: 0      CONTINUE these medications which have NOT CHANGED   Details  calcium acetate (PHOSLO) 667 MG capsule Take 1,334-2,001 mg by mouth 3 (three) times daily with meals. Take 2001 mgs with meals 3 times daily and 1334 with snacks    Calcium Carbonate Antacid (TUMS ULTRA 1000 PO) Take 2 tablets by mouth 3 (three) times daily before meals.    cinacalcet (SENSIPAR) 90 MG tablet Take 90 mg by mouth daily.    hydrALAZINE (APRESOLINE) 25 MG tablet Take 25 mg by mouth 3 (three) times daily.    irbesartan (AVAPRO) 300 MG tablet Take 1 tablet (300 mg total) by mouth every evening. Qty: 30 tablet, Refills: 0    lidocaine-prilocaine (EMLA) cream Apply 1 application topically as needed (dialysis access).    loratadine (CLARITIN) 10 MG tablet Take 10 mg by mouth daily as needed for itching.     oxyCODONE-acetaminophen (ROXICET) 5-325 MG tablet Take 1-2 tablets by mouth every 6 (six) hours as needed for moderate pain or severe pain. Qty: 50 tablet, Refills: 0    guaiFENesin-dextromethorphan  (ROBITUSSIN DM) 100-10 MG/5ML syrup Take 5 mLs by mouth every 4 (four) hours as needed for cough (chest congestion). Qty: 118 mL, Refills: 0    Ipratropium-Albuterol (COMBIVENT RESPIMAT) 20-100 MCG/ACT AERS respimat Inhale 1 puff into the lungs every 6 (six) hours. Qty: 1 Inhaler, Refills: 5      STOP taking these medications     amLODipine (NORVASC) 10 MG tablet      levofloxacin (LEVAQUIN) 500 MG tablet            Management plans discussed with the patient and he is in agreement. Stable for discharge   Patient should follow up with pcp  CODE STATUS:     Code Status Orders        Start     Ordered   04/30/16 0007  Full code  Continuous     04/30/16 0007    Code Status History    Date Active Date Inactive Code Status Order ID Comments User Context   02/26/2016  9:07 PM 02/28/2016  6:04 PM Full Code [id number]  Lance Coon, MD Inpatient   01/07/2016  1:32 AM 01/07/2016  5:24 PM Full Code [id number]  Harvie Bridge, DO ED   08/02/2015 11:16 PM 08/04/2015 11:11 PM Full Code [id number]  Quintella Baton, MD Inpatient   12/02/2014  8:52 PM 12/04/2014  5:53 PM Full Code [id number]  Vaughan Basta, MD Inpatient   11/10/2014 11:23 PM 11/12/2014  3:06 PM Full Code [id number]  Gladstone Lighter, MD Inpatient      TOTAL TIME TAKING CARE OF THIS PATIENT: 37 minutes.    Note: This dictation was prepared with Dragon dictation along with smaller phrase technology. Any transcriptional errors that result from this process are unintentional.  Keelin Sheridan M.D on 05/01/2016 at 8:23 AM  Between 7am to 6pm - Pager - [phone] After 6pm go to [email or web address] - password [email or web address] [facility]  Office  [phone]  CC: Primary care physician; [place]

## 2016-05-01 NOTE — Progress Notes (Signed)
HD COMPLETED  

## 2016-05-01 NOTE — Progress Notes (Signed)
POST DIALYSIS ASSESSMENT 

## 2016-05-01 NOTE — Progress Notes (Signed)
Subjective:    patient presents from home for SOB CXR shows cardiomegaly with congestion Patient reports dyspnea on exertion  2000 cc removed with HD yesterday Currently on room air Patient seen during dialysis Tolerating well    HEMODIALYSIS FLOWSHEET:  Blood Flow Rate (mL/min): 400 mL/min Arterial Pressure (mmHg): -170 mmHg Venous Pressure (mmHg): 170 mmHg Transmembrane Pressure (mmHg): 50 mmHg Ultrafiltration Rate (mL/min): 500 mL/min Dialysate Flow Rate (mL/min): 800 ml/min Conductivity: Machine : 14.1 Conductivity: Machine : 14.1 Dialysis Fluid Bolus: Normal Saline Bolus Amount (mL): 250 mL Intra-Hemodialysis Comments: 894ML (RESTING WITH EYES CLOSED)    Objective:  Vital signs in last 24 hours:  Temp:  [97.8 F (36.6 C)-98.7 F (37.1 C)] 98.4 F (36.9 C) (03/10 0955) Pulse Rate:  [70-81] 71 (03/10 1130) Resp:  [9-24] 9 (03/10 1000) BP: (144-173)/(58-112) 149/83 (03/10 1130) SpO2:  [92 %-100 %] 95 % (03/10 0955) Weight:  [75.4 kg (166 lb 4.3 oz)-78.6 kg (173 lb 4.5 oz)] 78.6 kg (173 lb 4.5 oz) (03/10 0955)  Weight change: -1.225 kg (-2 lb 11.2 oz) Filed Weights   04/30/16 2010 05/01/16 0348 05/01/16 0955  Weight: 75.9 kg (167 lb 4.8 oz) 75.4 kg (166 lb 4.3 oz) 78.6 kg (173 lb 4.5 oz)    Intake/Output:    Intake/Output Summary (Last 24 hours) at 05/01/16 1202 Last data filed at 05/01/16 9518  Gross per 24 hour  Intake                0 ml  Output             2200 ml  Net            -2200 ml     Physical Exam: General: NAD, laying in bed  HEENT anicteric  Neck supple  Pulm/lungs Coarse crackles at bases, normal effort  CVS/Heart Regular, no rub  Abdomen:  Soft, NT  Extremities: Trace to 1+ b/l edema  Neurologic: Alert, oriented  Skin: No acute rashes  Access: Rt IJ PC, aneurysmal AVF       Basic Metabolic Panel:   Recent Labs Lab 04/29/16 1926 04/30/16 0609 05/01/16 0515  NA 137 134* 138  K 5.8* 5.2* 4.1  CL 102 103 102  CO2 20* 19*  25  GLUCOSE 65 87 120*  BUN 34* 45* 31*  CREATININE 6.33* 7.27* 4.94*  CALCIUM 8.3* 7.8* 8.0*     CBC:  Recent Labs Lab 04/29/16 1926 04/30/16 0609  WBC 5.6 4.4  NEUTROABS 3.9  --   HGB 12.3* 11.1*  HCT 38.2* 34.3*  MCV 92.6 90.3  PLT 163 156      Microbiology:  No results found for this or any previous visit (from the past 720 hour(s)).  Coagulation Studies: No results for input(s): LABPROT, INR in the last 72 hours.  Urinalysis: No results for input(s): COLORURINE, LABSPEC, PHURINE, GLUCOSEU, HGBUR, BILIRUBINUR, KETONESUR, PROTEINUR, UROBILINOGEN, NITRITE, LEUKOCYTESUR in the last 72 hours.  Invalid input(s): APPERANCEUR    Imaging: Dg Chest 2 View  Result Date: 04/29/2016 CLINICAL DATA:  Shortness of breath EXAM: CHEST  2 VIEW COMPARISON:  02/26/2016 FINDINGS: Right-sided central venous catheter has been inserted and the tip overlies the SVC. There is cardiomegaly with mild central congestion. No large effusion. No focal consolidation. Atherosclerosis. No pneumothorax. IMPRESSION: Cardiomegaly with mild central vascular congestion. Electronically Signed   By: Donavan Foil M.D.   On: 04/29/2016 20:00     Medications:    . calcium acetate  1,334 mg  Oral With snacks  . calcium acetate  2,001 mg Oral TID WC  . calcium carbonate  2 tablet Oral TID AC  . carvedilol  6.25 mg Oral BID WC  . chlorhexidine  15 mL Mouth Rinse BID  . cinacalcet  90 mg Oral Daily  . heparin  5,000 Units Subcutaneous Q8H  . hydrALAZINE  25 mg Oral TID  . insulin aspart  0-5 Units Subcutaneous QHS  . insulin aspart  0-9 Units Subcutaneous TID WC  . irbesartan  300 mg Oral QPM  . mouth rinse  15 mL Mouth Rinse q12n4p  . sodium chloride flush  3 mL Intravenous Q12H   acetaminophen **OR** acetaminophen, ondansetron **OR** ondansetron (ZOFRAN) IV, oxyCODONE-acetaminophen  Assessment/ Plan:  61 y.o.AA male with hypertension, hepatitis C, ESRD started HD 10/13, anemia of CKD, admission  for diarrhea/hyperkalemia 10/2012, admission for GI bleed 10/2012, SHPTH, GI bleed 3/15, seizure episode, admission for pulmonary edema 9/16.  1.  End-stage renal disease [facility], [place], [place]; MWF; [facility] Resume normal dialysis schedule  2. AOCKD Hgb 11.1 Monitor  3. SHPTH  Low phos/renal diet Continue home dose of binders  4. Chronic SOB May need pulmonary evaluation   LOS: 2 [name] 3/10/201812:02 PM

## 2016-05-01 NOTE — Progress Notes (Signed)
This note also relates to the following rows which could not be included: Pulse Rate - Cannot attach notes to unvalidated device data Resp - Cannot attach notes to unvalidated device data BP - Cannot attach notes to unvalidated device data  HD STARTED

## 2016-05-01 NOTE — Progress Notes (Signed)
PRE DIALYSIS ASSESSMENT 

## 2016-05-01 NOTE — Plan of Care (Signed)
Problem: Cardiac: Goal: Ability to achieve and maintain adequate cardiopulmonary perfusion will improve Outcome: Progressing No voiced complaints of shortness of breath.  No distress noted.  Fluid restrictions reviewed. Pt voices no history of diabetes with dietary restrictions and requests diet to be changed.

## 2016-05-19 ENCOUNTER — Ambulatory Visit: Payer: Medicare Other | Admitting: Internal Medicine

## 2016-05-20 ENCOUNTER — Encounter: Payer: Self-pay | Admitting: Internal Medicine

## 2016-05-20 ENCOUNTER — Ambulatory Visit (INDEPENDENT_AMBULATORY_CARE_PROVIDER_SITE_OTHER): Payer: Medicare Other | Admitting: Internal Medicine

## 2016-05-20 VITALS — BP 160/90 | HR 76 | Ht 67.0 in | Wt 168.5 lb

## 2016-05-20 DIAGNOSIS — I5042 Chronic combined systolic (congestive) and diastolic (congestive) heart failure: Secondary | ICD-10-CM | POA: Diagnosis not present

## 2016-05-20 DIAGNOSIS — R079 Chest pain, unspecified: Secondary | ICD-10-CM | POA: Diagnosis not present

## 2016-05-20 DIAGNOSIS — I10 Essential (primary) hypertension: Secondary | ICD-10-CM

## 2016-05-20 MED ORDER — ISOSORBIDE MONONITRATE ER 30 MG PO TB24: 30.0000 mg | ORAL_TABLET | Freq: Every day | ORAL | 3 refills | Status: DC

## 2016-05-20 MED ORDER — HYDRALAZINE HCL 50 MG PO TABS: 50.0000 mg | ORAL_TABLET | Freq: Three times a day (TID) | ORAL | 3 refills | Status: DC

## 2016-05-20 MED ORDER — ASPIRIN EC 81 MG PO TBEC: 81.0000 mg | DELAYED_RELEASE_TABLET | Freq: Every day | ORAL | 3 refills | Status: DC

## 2016-05-20 NOTE — Patient Instructions (Signed)
Medication Instructions:  Your physician has recommended you make the following change in your medication:  1- INCREASE Hydralazine 50 mg (1 tablet) by mouth three times a day. 2- START Isosorbide Mononitrate 30 mg (1 tablet) by mouth once a day. 3- START Aspirin 81 mg by mouth once a day.   Labwork: NONE  Testing/Procedures: NONE  Follow-Up: Your physician recommends that you schedule a follow-up appointment in: [place].  If you need a refill on your cardiac medications before your next appointment, please call your pharmacy.

## 2016-05-20 NOTE — Progress Notes (Signed)
New Outpatient Visit Date: 05/20/2016  Referring Provider: Murlean Iba, MD [facility] [address] [facility] [address] [place], [place] [address]  Chief Complaint: Shortness of breath  HPI:  [name] is seen today for evaluation of shortness of breath and heart failure at the request of Dr. Candiss Norse. He is a 61 y.o. year-old male with history of ESRD on hemodialysis, chronic systolic heart failure of uncertain etiology, hypertension, and peripheral vascular disease. [name] notes worsening shortness of breath with even mild exertion over the last several months. He is only able to walk 20-30 feet before needing to stop. He also has occasional substernal chest tightness accompanying his exertional dyspnea, which lasts one to 2 minutes. It is maximal intensity is 5/10. He describes the pain as heartburn, though he does not have heartburn or indigestion at other times. He has 4 pillow orthopnea and frequently needs to sleep seated almost upright. He denies PND, palpitations, and lightheadedness. He has chronic leg edema, which has been stable.  [name] has been on hemodialysis for approximately 5 years. He has a left arm fistula that is currently not functional. He dialyzes through a tunneled catheter via the right internal jugular vein. He was evaluated by [facility] cardiology in 2015 as part of kidney transplant evaluation. Stress test at that time was felt to be low risk. LVEF was also low normal to mildly reduced at approximately 50%. However, subsequent echocardiograms in 10/2014 and 04/2016 revealed progressive decline in LV contraction, most recently 20-25%. The patient was hospitalized earlier this month with worsening shortness of breath due to acute on chronic systolic and diastolic heart failure. He underwent diuresis with furosemide as well as hemodialysis with improvement of his symptoms. Mild troponin elevation was also noted, felt to be due to supply-demand  mismatch by Dr. Nehemiah Massed of [facility]. No medication adjustments were recommended by Nehemiah Massed. It does not appear that ischemia evaluation has been performed since stress test at [facility] in 2015.  --------------------------------------------------------------------------------------------------  Cardiovascular History & Procedures: Cardiovascular Problems:  Chronic systolic heart failure  Risk Factors:  Hypertension, male gender, age greater than 62, and tobacco use  Cath/PCI:  None  CV Surgery:  None  EP Procedures and Devices:  None  Non-Invasive Evaluation(s):  Transthoracic echocardiogram (04/30/16): Severely dilated left ventricle with LVEF of 20-25%. There is diffuse hypokinesis and grade 3 diastolic dysfunction. Moderate aortic regurgitation is present. Right ventricle appears moderately dilated. There is moderate tricuspid regurgitation.  Transthoracic echocardiogram (11/11/14): Moderately dilated LV with LVEF of 30-35% and diffuse hypokinesis. Grade 1 diastolic dysfunction. Mild aortic regurgitation. Mild to moderate eccentric MR. Moderately dilated left atrium. Normal RV size and function. Mild to moderate TR. Moderate to severe pulmonary hypertension.  Transthoracic echocardiogram (11/13/13, [facility]): Mild LVH with LVEF of 50%. Diastolic dysfunction with elevated filling pressure evident. Mitral valve demonstrates mild thickening with annular calcification and mild regurgitation. Left atrium is mildly enlarged. Trivial aortic regurgitation. Mildly dilated thoracic aorta. Trivial TR.  Pharmacologic myocardial perfusion stress test (10/09/13, [facility]): Low risk study with basal inferior defect more pronounced on the rest images likely due to artifact. LVEF mildly reduced at 48%. Coronary artery calcification and mitral annular calcification noted.  Recent CV Pertinent Labs: Lab Results  Component Value Date   CHOL 101 11/07/2012   HDL 39 (L) 11/07/2012   LDLCALC 38  11/07/2012   TRIG 119 11/07/2012   INR 1.1 04/26/2013   BNP >4,500.0 (H) 04/29/2016   K 4.1 05/01/2016  K 4.4 04/30/2013   MG 1.8 08/04/2015   MG 1.5 (L) 11/05/2012   BUN 31 (H) 05/01/2016   BUN 76 (H) 04/30/2013   CREATININE 4.94 (H) 05/01/2016   CREATININE 9.49 (H) 04/30/2013    --------------------------------------------------------------------------------------------------  Past Medical History:  Diagnosis Date  . CHF (congestive heart failure) ([place])   . ESRD ([place] stage renal disease) ([place])   . HTN (hypertension) 08/02/2015  . Peripheral vascular disease [facility])     Past Surgical History:  Procedure Laterality Date  . A/V SHUNT INTERVENTION N/A 04/06/2016   Procedure: A/V Shunt Intervention;  Surgeon: Katha Cabal, MD;  Location: [place] CV LAB;  Service: Cardiovascular;  Laterality: N/A;  . A/V SHUNTOGRAM Left 04/06/2016   Procedure: A/V Fistulagram;  Surgeon: Katha Cabal, MD;  Location: [place] CV LAB;  Service: Cardiovascular;  Laterality: Left;  . AV FISTULA PLACEMENT Left 2014  . PERIPHERAL VASCULAR CATHETERIZATION N/A 10/03/2014   Procedure: A/V Shuntogram/Fistulagram;  Surgeon: Algernon Huxley, MD;  Location: [place] CV LAB;  Service: Cardiovascular;  Laterality: N/A;  . PERIPHERAL VASCULAR CATHETERIZATION Left 10/03/2014   Procedure: A/V Shunt Intervention;  Surgeon: Algernon Huxley, MD;  Location: [place] CV LAB;  Service: Cardiovascular;  Laterality: Left;  . PERIPHERAL VASCULAR CATHETERIZATION Left 05/01/2015   Procedure: A/V Shuntogram/Fistulagram;  Surgeon: Algernon Huxley, MD;  Location: [place] CV LAB;  Service: Cardiovascular;  Laterality: Left;  . PERIPHERAL VASCULAR CATHETERIZATION N/A 05/01/2015   Procedure: A/V Shunt Intervention;  Surgeon: Algernon Huxley, MD;  Location: [place] CV LAB;  Service: Cardiovascular;  Laterality: N/A;    Outpatient Encounter Prescriptions as of 05/20/2016  Medication Sig  . calcium acetate (PHOSLO)  667 MG capsule Take 1,334-2,001 mg by mouth 3 (three) times daily with meals. Take 2001 mgs with meals 3 times daily and 1334 with snacks  . Calcium Carbonate Antacid (TUMS ULTRA 1000 PO) Take 2 tablets by mouth 3 (three) times daily before meals.  . carvedilol (COREG) 6.25 MG tablet Take 1 tablet (6.25 mg total) by mouth 2 (two) times daily with a meal.  . cinacalcet (SENSIPAR) 90 MG tablet Take 90 mg by mouth daily.  [name] guaiFENesin-dextromethorphan (ROBITUSSIN DM) 100-10 MG/5ML syrup Take 5 mLs by mouth every 4 (four) hours as needed for cough (chest congestion).  . hydrALAZINE (APRESOLINE) 25 MG tablet Take 25 mg by mouth 3 (three) times daily.  . Ipratropium-Albuterol (COMBIVENT RESPIMAT) 20-100 MCG/ACT AERS respimat Inhale 1 puff into the lungs every 6 (six) hours.  . irbesartan (AVAPRO) 300 MG tablet Take 1 tablet (300 mg total) by mouth every evening.  . lidocaine-prilocaine (EMLA) cream Apply 1 application topically as needed (dialysis access).  [name] loratadine (CLARITIN) 10 MG tablet Take 10 mg by mouth daily as needed for itching.   . [DISCONTINUED] oxyCODONE-acetaminophen (ROXICET) 5-325 MG tablet Take 1-2 tablets by mouth every 6 (six) hours as needed for moderate pain or severe pain. (Patient not taking: Reported on 05/20/2016)   No facility-administered encounter medications on file as of 05/20/2016.     Allergies: Sulfa antibiotics  Social History   Social History  . Marital status: Widowed    Spouse name: N/A  . Number of children: N/A  . Years of education: N/A   Occupational History  . Not on file.   Social History Main Topics  . Smoking status: Current Some Day Smoker    Packs/day: 0.25    Years: 40.00    Types: Cigarettes  .  Smokeless tobacco: Never Used  . Alcohol use No  . Drug use: No  . Sexual activity: Not on file   Other Topics Concern  . Not on file   Social History Narrative   Lives at home by himself.   Independent on ambulation    Family History    Problem Relation Age of Onset  . Hypertension Son   . Diabetes Son   . Hypertension Mother   . Diabetes Sister     Review of Systems: A 12-system review of systems was performed and was negative except as noted in the HPI.  --------------------------------------------------------------------------------------------------  Physical Exam: BP (!) 160/90 (BP Location: Right Arm, Patient Position: Sitting, Cuff Size: Normal)   Pulse 76   Ht 5\' 7"  (1.702 m)   Wt 168 lb 8 oz (76.4 kg)   BMI 26.39 kg/m   General:  Well-developed, well-nourished man seated comfortably in the exam room. HEENT: No conjunctival pallor or scleral icterus.  Moist mucous membranes.  OP clear. Neck: Supple without lymphadenopathy or thyromegaly. JVP approximately 10 cm with positive HJR. No carotid bruit. Lungs: Normal work of breathing.  Clear to auscultation bilaterally without wheezes or crackles. Heart: Regular rate and rhythm without murmurs, rubs, or gallops.  Non-displaced PMI. Abd: Bowel sounds present.  Soft, NT/ND without hepatosplenomegaly Ext: 1-2+ pretibial edema bilaterally.  Radial, PT, and DP pulses are 2+ bilaterally Skin: warm and dry without rash. Right upper chest tunneled catheter site is covered with clean dressing. Neuro: CNIII-XII intact.  Strength and fine-touch sensation intact in upper and lower extremities bilaterally. Psych: Normal mood and affect.  EKG:  Normal sinus rhythm with LVH. ST/T changes, most pronounced in the inferolateral leads, most likely reflect abnormal repolarization though ischemia cannot be excluded. No significant change from 04/29/16 (I have personally reviewed both tracings).  Lab Results  Component Value Date   WBC 4.4 04/30/2016   HGB 11.1 (L) 04/30/2016   HCT 34.3 (L) 04/30/2016   MCV 90.3 04/30/2016   PLT 156 04/30/2016    Lab Results  Component Value Date   NA 138 05/01/2016   K 4.1 05/01/2016   CL 102 05/01/2016   CO2 25 05/01/2016   BUN 31 (H)  05/01/2016   CREATININE 4.94 (H) 05/01/2016   GLUCOSE 120 (H) 05/01/2016   ALT 38 04/29/2016    Lab Results  Component Value Date   CHOL 101 11/07/2012   HDL 39 (L) 11/07/2012   LDLCALC 38 11/07/2012   TRIG 119 11/07/2012    --------------------------------------------------------------------------------------------------  ASSESSMENT AND PLAN: Chronic systolic and diastolic heart failure Progressive shortness of breath with evidence of volume overload likely reflect worsening heart failure. The patient currently has NYHA class III symptoms. His LVEF is severely reduced by echo, which has progressed over the last 2.5 years. I suspect poorly controlled hypertension is the driving factor, though underlying ischemia cannot be excluded. I spoke with the patient at length regarding the importance of ischemia evaluation. I would favor coronary angiography over noninvasive testing. However, the patient is very hesitant to proceed with catheterization or stress testing at this time. We have agreed to start isosorbide mononitrate 30 mg daily for blood pressure control, heart failure therapy, and anti-anginal treatment. I will also have the patient increase hydralazine to 50 mg 3 times a day. He would likely benefit from more aggressive fluid removal, as he appears volume overloaded on exam today. He should continue carvedilol and irbesartan.  Chest pain This concern on be due to  underlying coronary artery disease. However, chest pain could also be a reflection of his severe heart failure. As above, I recommended ischemia evaluation, ideally with cardiac catheterization. The patient has declined this. We will add isosorbide mononitrate and increase hydralazine, as above. I have also recommended that the patient start taking aspirin 81 mg daily. Smoking cessation was encouraged.  Hypertension Uncontrolled blood pressure today. As above, we will double hydralazine and add isosorbide mononitrate.  Additional fluid removal may be helpful in controlling blood pressure is well.  Follow-up: Return to clinic in 1 month.  Nelva Bush, MD 05/22/2016 3:34 PM

## 2016-05-22 ENCOUNTER — Encounter: Payer: Self-pay | Admitting: Internal Medicine

## 2016-05-22 DIAGNOSIS — R079 Chest pain, unspecified: Secondary | ICD-10-CM | POA: Insufficient documentation

## 2016-05-27 ENCOUNTER — Other Ambulatory Visit (INDEPENDENT_AMBULATORY_CARE_PROVIDER_SITE_OTHER): Payer: Medicare Other

## 2016-05-27 ENCOUNTER — Ambulatory Visit (INDEPENDENT_AMBULATORY_CARE_PROVIDER_SITE_OTHER): Payer: Medicare Other | Admitting: Vascular Surgery

## 2016-05-27 ENCOUNTER — Encounter (INDEPENDENT_AMBULATORY_CARE_PROVIDER_SITE_OTHER): Payer: Self-pay | Admitting: Vascular Surgery

## 2016-05-27 ENCOUNTER — Other Ambulatory Visit (INDEPENDENT_AMBULATORY_CARE_PROVIDER_SITE_OTHER): Payer: Self-pay | Admitting: Vascular Surgery

## 2016-05-27 VITALS — BP 162/96 | HR 69 | Resp 17 | Wt 168.0 lb

## 2016-05-27 DIAGNOSIS — Z01810 Encounter for preprocedural cardiovascular examination: Secondary | ICD-10-CM

## 2016-05-27 DIAGNOSIS — N186 End stage renal disease: Secondary | ICD-10-CM

## 2016-05-27 DIAGNOSIS — Z992 Dependence on renal dialysis: Secondary | ICD-10-CM

## 2016-05-27 DIAGNOSIS — Z720 Tobacco use: Secondary | ICD-10-CM

## 2016-06-06 ENCOUNTER — Inpatient Hospital Stay
Admission: EM | Admit: 2016-06-06 | Discharge: 2016-06-08 | DRG: 291 | Disposition: A | Discharge status: Home / Self Care | Payer: Medicare Other | Attending: Internal Medicine | Admitting: Internal Medicine

## 2016-06-06 ENCOUNTER — Encounter: Payer: Self-pay | Admitting: Emergency Medicine

## 2016-06-06 ENCOUNTER — Emergency Department: Payer: Medicare Other

## 2016-06-06 DIAGNOSIS — Y712 Prosthetic and other implants, materials and accessory cardiovascular devices associated with adverse incidents: Secondary | ICD-10-CM | POA: Diagnosis present

## 2016-06-06 DIAGNOSIS — I5043 Acute on chronic combined systolic (congestive) and diastolic (congestive) heart failure: Secondary | ICD-10-CM | POA: Diagnosis present

## 2016-06-06 DIAGNOSIS — I10 Essential (primary) hypertension: Secondary | ICD-10-CM | POA: Diagnosis not present

## 2016-06-06 DIAGNOSIS — N179 Acute kidney failure, unspecified: Secondary | ICD-10-CM | POA: Diagnosis present

## 2016-06-06 DIAGNOSIS — J9601 Acute respiratory failure with hypoxia: Secondary | ICD-10-CM | POA: Diagnosis not present

## 2016-06-06 DIAGNOSIS — R748 Abnormal levels of other serum enzymes: Secondary | ICD-10-CM | POA: Diagnosis present

## 2016-06-06 DIAGNOSIS — D631 Anemia in chronic kidney disease: Secondary | ICD-10-CM | POA: Diagnosis present

## 2016-06-06 DIAGNOSIS — Z79899 Other long term (current) drug therapy: Secondary | ICD-10-CM

## 2016-06-06 DIAGNOSIS — N186 End stage renal disease: Secondary | ICD-10-CM | POA: Diagnosis present

## 2016-06-06 DIAGNOSIS — E875 Hyperkalemia: Secondary | ICD-10-CM | POA: Diagnosis present

## 2016-06-06 DIAGNOSIS — R7989 Other specified abnormal findings of blood chemistry: Secondary | ICD-10-CM

## 2016-06-06 DIAGNOSIS — I739 Peripheral vascular disease, unspecified: Secondary | ICD-10-CM | POA: Diagnosis present

## 2016-06-06 DIAGNOSIS — T82510A Breakdown (mechanical) of surgically created arteriovenous fistula, initial encounter: Secondary | ICD-10-CM | POA: Diagnosis present

## 2016-06-06 DIAGNOSIS — Z992 Dependence on renal dialysis: Secondary | ICD-10-CM | POA: Diagnosis not present

## 2016-06-06 DIAGNOSIS — R778 Other specified abnormalities of plasma proteins: Secondary | ICD-10-CM

## 2016-06-06 DIAGNOSIS — I272 Pulmonary hypertension, unspecified: Secondary | ICD-10-CM | POA: Diagnosis present

## 2016-06-06 DIAGNOSIS — F17210 Nicotine dependence, cigarettes, uncomplicated: Secondary | ICD-10-CM | POA: Diagnosis present

## 2016-06-06 DIAGNOSIS — I429 Cardiomyopathy, unspecified: Secondary | ICD-10-CM | POA: Diagnosis present

## 2016-06-06 DIAGNOSIS — I132 Hypertensive heart and chronic kidney disease with heart failure and with stage 5 chronic kidney disease, or end stage renal disease: Secondary | ICD-10-CM | POA: Diagnosis present

## 2016-06-06 DIAGNOSIS — I071 Rheumatic tricuspid insufficiency: Secondary | ICD-10-CM | POA: Diagnosis present

## 2016-06-06 DIAGNOSIS — Z882 Allergy status to sulfonamides status: Secondary | ICD-10-CM

## 2016-06-06 DIAGNOSIS — Z833 Family history of diabetes mellitus: Secondary | ICD-10-CM | POA: Diagnosis not present

## 2016-06-06 DIAGNOSIS — Z7982 Long term (current) use of aspirin: Secondary | ICD-10-CM | POA: Diagnosis not present

## 2016-06-06 DIAGNOSIS — Z8249 Family history of ischemic heart disease and other diseases of the circulatory system: Secondary | ICD-10-CM

## 2016-06-06 DIAGNOSIS — B1920 Unspecified viral hepatitis C without hepatic coma: Secondary | ICD-10-CM | POA: Diagnosis present

## 2016-06-06 DIAGNOSIS — R0602 Shortness of breath: Secondary | ICD-10-CM | POA: Diagnosis present

## 2016-06-06 DIAGNOSIS — R569 Unspecified convulsions: Secondary | ICD-10-CM | POA: Diagnosis present

## 2016-06-06 DIAGNOSIS — F1410 Cocaine abuse, uncomplicated: Secondary | ICD-10-CM | POA: Diagnosis present

## 2016-06-06 HISTORY — DX: Pulmonary hypertension, unspecified: I27.20

## 2016-06-06 HISTORY — DX: Chronic combined systolic (congestive) and diastolic (congestive) heart failure: I50.42

## 2016-06-06 HISTORY — DX: Hypertensive heart disease with heart failure: I11.0

## 2016-06-06 LAB — BASIC METABOLIC PANEL
Anion gap: 16 — ABNORMAL HIGH (ref 5–15)
BUN: 78 mg/dL — AB (ref 6–20)
CHLORIDE: 104 mmol/L (ref 101–111)
CO2: 19 mmol/L — ABNORMAL LOW (ref 22–32)
CREATININE: 10.02 mg/dL — AB (ref 0.61–1.24)
Calcium: 8.8 mg/dL — ABNORMAL LOW (ref 8.9–10.3)
GFR calc Af Amer: 6 mL/min — ABNORMAL LOW (ref >60)
GFR, EST NON AFRICAN AMERICAN: 5 mL/min — AB (ref >60)
Glucose, Bld: 117 mg/dL — ABNORMAL HIGH (ref 65–99)
Potassium: 5.2 mmol/L — ABNORMAL HIGH (ref 3.5–5.1)
SODIUM: 139 mmol/L (ref 135–145)

## 2016-06-06 LAB — CBC WITH DIFFERENTIAL/PLATELET
Basophils Absolute: 0.1 10*3/uL (ref 0–0.1)
Basophils Relative: 1 %
EOS PCT: 1 %
Eosinophils Absolute: 0.1 10*3/uL (ref 0–0.7)
HCT: 35 % — ABNORMAL LOW (ref 40.0–52.0)
Hemoglobin: 11.4 g/dL — ABNORMAL LOW (ref 13.0–18.0)
LYMPHS ABS: 0.7 10*3/uL — AB (ref 1.0–3.6)
LYMPHS PCT: 11 %
MCH: 30.3 pg (ref 26.0–34.0)
MCHC: 32.7 g/dL (ref 32.0–36.0)
MCV: 92.9 fL (ref 80.0–100.0)
Monocytes Absolute: 0.7 10*3/uL (ref 0.2–1.0)
Monocytes Relative: 11 %
Neutro Abs: 4.5 10*3/uL (ref 1.4–6.5)
Neutrophils Relative %: 76 %
PLATELETS: 189 10*3/uL (ref 150–440)
RBC: 3.77 MIL/uL — AB (ref 4.40–5.90)
RDW: 17.8 % — ABNORMAL HIGH (ref 11.5–14.5)
WBC: 6 10*3/uL (ref 3.8–10.6)

## 2016-06-06 LAB — BLOOD GAS, VENOUS
Acid-base deficit: 6.1 mmol/L — ABNORMAL HIGH (ref 0.0–2.0)
Bicarbonate: 18.8 mmol/L — ABNORMAL LOW (ref 20.0–28.0)
O2 SAT: 95.1 %
PCO2 VEN: 34 mmHg — AB (ref 44.0–60.0)
PH VEN: 7.35 (ref 7.250–7.430)
Patient temperature: 37
pO2, Ven: 80 mmHg — ABNORMAL HIGH (ref 32.0–45.0)

## 2016-06-06 LAB — MAGNESIUM: MAGNESIUM: 1.9 mg/dL (ref 1.7–2.4)

## 2016-06-06 LAB — BRAIN NATRIURETIC PEPTIDE: B Natriuretic Peptide: >4500 pg/mL — ABNORMAL HIGH (ref 0.0–100.0)

## 2016-06-06 MED ORDER — IPRATROPIUM-ALBUTEROL 0.5-2.5 (3) MG/3ML IN SOLN
3.0000 mL | Freq: Once | RESPIRATORY_TRACT | Status: AC
  Administered 2016-06-06: 3 mL via RESPIRATORY_TRACT
  Filled 2016-06-06: qty 3

## 2016-06-06 MED ORDER — HYDRALAZINE HCL 20 MG/ML IJ SOLN
20.0000 mg | Freq: Once | INTRAMUSCULAR | Status: AC
  Administered 2016-06-06: 20 mg via INTRAVENOUS
  Filled 2016-06-06: qty 1

## 2016-06-06 MED ORDER — METHYLPREDNISOLONE SODIUM SUCC 125 MG IJ SOLR
60.0000 mg | Freq: Once | INTRAMUSCULAR | Status: AC
  Administered 2016-06-06: 60 mg via INTRAVENOUS
  Filled 2016-06-06: qty 2

## 2016-06-06 MED ORDER — NITROGLYCERIN 2 % TD OINT
1.0000 [in_us] | TOPICAL_OINTMENT | Freq: Once | TRANSDERMAL | Status: AC
  Administered 2016-06-06: 1 [in_us] via TOPICAL
  Filled 2016-06-06: qty 1

## 2016-06-06 NOTE — ED Notes (Signed)
Duonebs were administered through BiPaP machine by RT

## 2016-06-06 NOTE — ED Notes (Signed)
Pt placed on 2L oxygen via [place] and sats increased to 95%

## 2016-06-06 NOTE — H&P (Signed)
History and Physical   [facility] - [place] @ [facility] Admission History and Physical [employer], D.O.    Patient Name: [name] MR#: [id number] Date of Birth: Jul 21, 1955 Date of Admission: 06/06/2016  Referring MD/NP/PA: Dr. Quentin Cornwall Primary Care Physician: [place] Patient coming from: Home Outpatient Specialists: Nephrology   Chief Complaint:  Chief Complaint  Patient presents with  . Shortness of Breath    HPI: [name] is a 61 y.o. male with a known history of CHF, ESRD HD M-W-F, HTN, PVD presents to the emergency department for evaluation of SOB.  Patient had a full session of dialysis two days ago (Friday) but became increasingly short of breath with dyspnea on exertion, orthopnea and lower extremity swelling.    Patient denies fevers/chills, weakness, dizziness, chest pain, cough, N/V/C/D, abdominal pain, dysuria/frequency, changes in mental status.    Of note he was hospitalized for similar symptoms on 04/29/16.  Otherwise there has been no change in status. Patient has been taking medication as prescribed and there has been no recent change in medication or diet.  No recent antibiotics.  There has been no recent illness, travel or sick contacts.    EMS/ED Course: Patient received hydralazine, Duonebx4, Solumedrol, NTG and BiPAP.  Review of Systems:  CONSTITUTIONAL: No fever/chills, fatigue, weakness, weight gain/loss, headache. EYES: No blurry or double vision. ENT: No tinnitus, postnasal drip, redness or soreness of the oropharynx. RESPIRATORY: Positive cough, dyspnea, wheeze.  No hemoptysis.  CARDIOVASCULAR: No chest pain, palpitations, syncope, orthopnea. Positive lower extremity edema.  GASTROINTESTINAL: No nausea, vomiting, abdominal pain, diarrhea, constipation.  No hematemesis, melena or hematochezia. GENITOURINARY: No dysuria, frequency, hematuria. ENDOCRINE: No polyuria or nocturia. No heat or cold intolerance. HEMATOLOGY: No  anemia, bruising, bleeding. INTEGUMENTARY: No rashes, ulcers, lesions. MUSCULOSKELETAL: No arthritis, gout, dyspnea. NEUROLOGIC: No numbness, tingling, ataxia, seizure-type activity, weakness. PSYCHIATRIC: No anxiety, depression, insomnia.   Past Medical History:  Diagnosis Date  . CHF (congestive heart failure) ([place])   . ESRD (end stage renal disease) ([place])   . HTN (hypertension) 08/02/2015  . Peripheral vascular disease [facility])     Past Surgical History:  Procedure Laterality Date  . A/V SHUNT INTERVENTION N/A 04/06/2016   Procedure: A/V Shunt Intervention;  Surgeon: Katha Cabal, MD;  Location: [place] CV LAB;  Service: Cardiovascular;  Laterality: N/A;  . A/V SHUNTOGRAM Left 04/06/2016   Procedure: A/V Fistulagram;  Surgeon: Katha Cabal, MD;  Location: [place] CV LAB;  Service: Cardiovascular;  Laterality: Left;  . AV FISTULA PLACEMENT Left 2014  . PERIPHERAL VASCULAR CATHETERIZATION N/A 10/03/2014   Procedure: A/V Shuntogram/Fistulagram;  Surgeon: Algernon Huxley, MD;  Location: [place] CV LAB;  Service: Cardiovascular;  Laterality: N/A;  . PERIPHERAL VASCULAR CATHETERIZATION Left 10/03/2014   Procedure: A/V Shunt Intervention;  Surgeon: Algernon Huxley, MD;  Location: [place] CV LAB;  Service: Cardiovascular;  Laterality: Left;  . PERIPHERAL VASCULAR CATHETERIZATION Left 05/01/2015   Procedure: A/V Shuntogram/Fistulagram;  Surgeon: Algernon Huxley, MD;  Location: [place] CV LAB;  Service: Cardiovascular;  Laterality: Left;  . PERIPHERAL VASCULAR CATHETERIZATION N/A 05/01/2015   Procedure: A/V Shunt Intervention;  Surgeon: Algernon Huxley, MD;  Location: [place] CV LAB;  Service: Cardiovascular;  Laterality: N/A;     reports that he has been smoking Cigarettes.  He has a 10.00 pack-year smoking history. He has never used smokeless tobacco. He reports that he does not drink alcohol or use drugs.  Allergies  Allergen  Reactions  . Sulfa Antibiotics Rash     Family History  Problem Relation Age of Onset  . Hypertension Son   . Diabetes Son   . Hypertension Mother   . Diabetes Mother   . Diabetes Sister     Prior to Admission medications   Medication Sig Start Date End Date Taking? Authorizing Provider  albuterol (PROVENTIL HFA;VENTOLIN HFA) 108 (90 Base) MCG/ACT inhaler Inhale into the lungs every 4 (four) hours as needed for wheezing or shortness of breath.   Yes Historical Provider, MD  aspirin EC 81 MG tablet Take 1 tablet (81 mg total) by mouth daily. 05/20/16  Yes Nelva Bush, MD  calcium acetate (PHOSLO) 667 MG capsule Take 1,334-2,001 mg by mouth 3 (three) times daily with meals. Take 2001 mgs with meals 3 times daily and 1334 with snacks 12/11/15  Yes Historical Provider, MD  Calcium Carbonate Antacid (TUMS ULTRA 1000 PO) Take 2 tablets by mouth 3 (three) times daily before meals.   Yes Historical Provider, MD  carvedilol (COREG) 6.25 MG tablet Take 1 tablet (6.25 mg total) by mouth 2 (two) times daily with a meal. 05/01/16  Yes Sital Mody, MD  cinacalcet (SENSIPAR) 90 MG tablet Take 90 mg by mouth daily.   Yes Historical Provider, MD  hydrALAZINE (APRESOLINE) 50 MG tablet Take 1 tablet (50 mg total) by mouth 3 (three) times daily. 05/20/16 08/18/16 Yes Christopher End, MD  ipratropium (ATROVENT HFA) 17 MCG/ACT inhaler Inhale 2 puffs into the lungs every 4 (four) hours as needed for wheezing.   Yes Historical Provider, MD  isosorbide mononitrate (IMDUR) 30 MG 24 hr tablet Take 1 tablet (30 mg total) by mouth daily. 05/20/16 08/18/16 Yes Christopher End, MD  lidocaine-prilocaine (EMLA) cream Apply 1 application topically as needed (dialysis access).   Yes Historical Provider, MD    Physical Exam: Vitals:   06/06/16 2145 06/06/16 2211 06/06/16 2230 06/06/16 2300  BP: (!) 155/100  (!) 176/119 (!) 176/110  Pulse: 79  85 80  Resp: (!) 21  (!) 40 18  Temp:      TempSrc:      SpO2: 100%  94% 96%  Weight:  75.7 kg (166 lb 14.4 oz)     Height:  5\' 7"  (1.702 m)      GENERAL: 61 y.o.-year-old male patient, BiPAP on, in no acute distress. Speaks in one word answers to short sentences.    HEENT: Head atraumatic, normocephalic. Pupils equal, round, reactive to light and accommodation. No scleral icterus. Extraocular muscles intact. Nares are patent. Oropharynx is clear. Mucus membranes moist. NECK: Supple, full range of motion. No JVD, no bruit heard. No thyroid enlargement, no tenderness, no cervical lymphadenopathy. CHEST: Bibasilar crackles. No use of accessory muscles of respiration.  No reproducible chest wall tenderness.  CARDIOVASCULAR: S1, S2 normal. No murmurs, rubs, or gallops. Cap refill <2 seconds. Pulses intact distally.  ABDOMEN: Soft, nondistended, nontender. No rebound, guarding, rigidity. Normoactive bowel sounds present in all four quadrants. No organomegaly or mass. EXTREMITIES: Mild pitting pedal edema, cyanosis, or clubbing. No calf tenderness or Homan's sign.  NEUROLOGIC: The patient is alert and oriented x 3. Cranial nerves II through XII are grossly intact with no focal sensorimotor deficit. Muscle strength 5/5 in all extremities. Sensation intact. Gait not checked.   Labs on Admission:  CBC:  Recent Labs Lab 06/06/16 2125  WBC 6.0  NEUTROABS 4.5  HGB 11.4*  HCT 35.0*  MCV 92.9  PLT 621   Basic Metabolic Panel:  Recent Labs Lab 06/06/16 2125  NA 139  K 5.2*  CL 104  CO2 19*  GLUCOSE 117*  BUN 78*  CREATININE 10.02*  CALCIUM 8.8*  MG 1.9   GFR: Estimated Creatinine Clearance: 7.1 mL/min (A) (by C-G formula based on SCr of 10.02 mg/dL (H)). Liver Function Tests: No results for input(s): AST, ALT, ALKPHOS, BILITOT, PROT, ALBUMIN in the last 168 hours. No results for input(s): LIPASE, AMYLASE in the last 168 hours. No results for input(s): AMMONIA in the last 168 hours. Coagulation Profile: No results for input(s): INR, PROTIME in the last 168 hours. Cardiac Enzymes: No results for  input(s): CKTOTAL, CKMB, CKMBINDEX, TROPONINI in the last 168 hours. BNP (last 3 results) No results for input(s): PROBNP in the last 8760 hours. HbA1C: No results for input(s): HGBA1C in the last 72 hours. CBG: No results for input(s): GLUCAP in the last 168 hours. Lipid Profile: No results for input(s): CHOL, HDL, LDLCALC, TRIG, CHOLHDL, LDLDIRECT in the last 72 hours. Thyroid Function Tests: No results for input(s): TSH, T4TOTAL, FREET4, T3FREE, THYROIDAB in the last 72 hours. Anemia Panel: No results for input(s): VITAMINB12, FOLATE, FERRITIN, TIBC, IRON, RETICCTPCT in the last 72 hours. Urine analysis:    Component Value Date/Time   COLORURINE Straw 11/05/2012 2135   APPEARANCEUR Clear 11/05/2012 2135   LABSPEC 1.005 11/05/2012 2135   PHURINE 7.0 11/05/2012 2135   GLUCOSEU 50 mg/dL 11/05/2012 2135   HGBUR 1+ 11/05/2012 2135   BILIRUBINUR Negative 11/05/2012 2135   KETONESUR Negative 11/05/2012 2135   PROTEINUR 25 mg/dL 11/05/2012 2135   NITRITE Negative 11/05/2012 2135   LEUKOCYTESUR Negative 11/05/2012 2135   Sepsis Labs: @LABRCNTIP (procalcitonin:4,lacticidven:4) )No results found for this or any previous visit (from the past 240 hour(s)).   Radiological Exams on Admission: Dg Chest 2 View  Result Date: 06/06/2016 CLINICAL DATA:  Acute dyspnea, edema EXAM: CHEST  2 VIEW COMPARISON:  04/29/2016 chest radiograph. FINDINGS: Right internal jugular central venous catheter terminates at the cavoatrial junction. Stable cardiomediastinal silhouette with cardiomegaly and aortic atherosclerosis. No pneumothorax. No pleural effusion. Mild pulmonary edema. No acute consolidative airspace disease. IMPRESSION: Mild congestive heart failure. Aortic atherosclerosis. Electronically Signed   By: Ilona Sorrel M.D.   On: 06/06/2016 22:48    EKG: Normal sinus rhythm at 80 bpm with normal axis, T wave flattening and inversions in II, III, aVF and V5-V6, nonspecific ST-T wave changes.   ECHO  04/30/16: Study Conclusions  - Left ventricle: The cavity size was severely dilated. Systolic   function was severely reduced. The estimated ejection fraction   was in the range of 20% to 25%. Diffuse hypokinesis. - Aortic valve: There was moderate regurgitation. Valve area   (Vmax): 2.03 cm^2. - Mitral valve: Systolic bowing without prolapse. - Right ventricle: The cavity size was moderately dilated. - Tricuspid valve: There was moderate regurgitation.  Impressions:  - The right ventricular systolic pressure was increased consistent   with severe pulmonary hypertension. Four-chamber dilatation with   severe left ventricular systolic dysfunction with left ventricle   ejection fraction 16-20% and diffuse hypokinesis. No thrombi seen   in the left atrium or left ventricle. There is severe mitral and   moderate aortic and tricuspid regurgitation. There is grade 3   diastolic dysfunction.   Assessment/Plan  This is a 61 y.o. male with a history of CHF, ESRD HD M-W-F, HTN, PVD now being admitted with:  #. Acute hypoxic respiratory failure secondary to exacerbation of chronic combined systolic and diasolic  CHF - Admit inpatient stepdown with pulse oximetry and telemetry monitoring. - Will need HD in AM d/w Dr. Juleen China - Continue Coreg, Imdur, nitropaste - Intake/output, daily weight. - Trend troponins, check TSH - Cardiology consultation requested.  #. ESRD on HD for HD in AM - Nephrology consulted - Continue Phoslo, Sensipar  #. H/O HTN - Continue hydralazine, Coreg, Imdur  Admission status: Inpatient stepdown IV Fluids: HL Diet/Nutrition: Renal Consults called: Cardio, Nephrology  DVT Px: Heparin, SCDs and early ambulation. Code Status: Full Code  Disposition Plan: To home in 1-2 days  All the records are reviewed and case discussed with ED provider. Management plans discussed with the patient and/or family who express understanding and agree with plan of care.  Jessalynn Mccowan D.O. on 06/06/2016 at 11:16 PM Between 7am to 6pm - Pager - [phone] After 6pm go to [email or web address] - [employer] [facility] [place] [facility] Office [phone] CC: Primary care physician; [place]   06/06/2016, 11:16 PM

## 2016-06-06 NOTE — ED Notes (Signed)
Patient transported to X-ray 

## 2016-06-06 NOTE — ED Triage Notes (Signed)
Pt arrived via EMS from home with c/o shortness of breath for several days; productive cough; "a little bit" of chest pain to center of chest;  talking in complete coherent sentences;

## 2016-06-06 NOTE — ED Notes (Signed)
EMS pt from home c/o SOB for several days; dialysis pt; had treatment on Friday and due again tomorrow; says he feels like his fluid is building back up; sats 91% on room air; nausea with vomiting upon arrival of EMS; given zofran iv 4mg ; BS 125; 98.9 temp; 20guage saline lock to right [facility]

## 2016-06-06 NOTE — ED Provider Notes (Signed)
[facility] Emergency Department Provider Note    First MD Initiated Contact with Patient 06/06/16 2125     (approximate)  I have reviewed the triage vital signs and the nursing notes.   HISTORY  Chief Complaint Shortness of Breath    HPI [name] is a 61 y.o. male with a history of congestive heart failure as well as end-stage renal disease on Monday Wednesday Friday dialysis presents with several days of worsening shortness of breath and exertional dyspnea. Patient states that he had treatment on Friday and got a normal draw. Since then states he feels that his shortness of breath is gradually worsening. States he's been compliant with his medications. Is endorsing worsening orthopnea. Denies any fevers. No productive cough. Does have a long history of smoking. Denies any abdominal pain. Patient speaking in short sentences.   Past Medical History:  Diagnosis Date  . CHF (congestive heart failure) ([place])   . ESRD (end stage renal disease) ([place])   . HTN (hypertension) 08/02/2015  . Peripheral vascular disease ([place])    Family History  Problem Relation Age of Onset  . Hypertension Son   . Diabetes Son   . Hypertension Mother   . Diabetes Mother   . Diabetes Sister    Past Surgical History:  Procedure Laterality Date  . A/V SHUNT INTERVENTION N/A 04/06/2016   Procedure: A/V Shunt Intervention;  Surgeon: Katha Cabal, MD;  Location: [place] [facility];  Service: Cardiovascular;  Laterality: N/A;  . A/V SHUNTOGRAM Left 04/06/2016   Procedure: A/V Fistulagram;  Surgeon: Katha Cabal, MD;  Location: [place] [facility];  Service: Cardiovascular;  Laterality: Left;  . AV FISTULA PLACEMENT Left 2014  . PERIPHERAL VASCULAR CATHETERIZATION N/A 10/03/2014   Procedure: A/V Shuntogram/Fistulagram;  Surgeon: Algernon Huxley, MD;  Location: [place] [facility];  Service: Cardiovascular;  Laterality: N/A;  . PERIPHERAL VASCULAR CATHETERIZATION Left  10/03/2014   Procedure: A/V Shunt Intervention;  Surgeon: Algernon Huxley, MD;  Location: [place] [facility];  Service: Cardiovascular;  Laterality: Left;  . PERIPHERAL VASCULAR CATHETERIZATION Left 05/01/2015   Procedure: A/V Shuntogram/Fistulagram;  Surgeon: Algernon Huxley, MD;  Location: [place] [facility];  Service: Cardiovascular;  Laterality: Left;  . PERIPHERAL VASCULAR CATHETERIZATION N/A 05/01/2015   Procedure: A/V Shunt Intervention;  Surgeon: Algernon Huxley, MD;  Location: [place] [facility];  Service: Cardiovascular;  Laterality: N/A;   Patient Active Problem List   Diagnosis Date Noted  . Chest pain 05/22/2016  . Sepsis ([place]) 02/26/2016  . HCAP (healthcare-associated pneumonia) 02/26/2016  . Diabetes ([place]) 02/26/2016  . Community acquired pneumonia 01/07/2016  . Renal dialysis device, implant, or graft complication 02/63/7858  . Tobacco use 09/04/2015  . ESRD on dialysis ([place]) 08/02/2015  . Fluid overload 08/02/2015  . HTN (hypertension) 08/02/2015  . Acute on chronic combined systolic and diastolic CHF (congestive heart failure) ([place]) 11/10/2014      Prior to Admission medications   Medication Sig Start Date End Date Taking? Authorizing Provider  albuterol (PROVENTIL HFA;VENTOLIN HFA) 108 (90 Base) MCG/ACT inhaler Inhale into the lungs every 4 (four) hours as needed for wheezing or shortness of breath.   Yes Historical Provider, MD  aspirin EC 81 MG tablet Take 1 tablet (81 mg total) by mouth daily. 05/20/16  Yes Nelva [place], MD  calcium acetate (PHOSLO) 667 MG capsule Take 1,334-2,001 mg by mouth 3 (three) times daily with meals. Take 2001 mgs with meals 3 times  daily and 1334 with snacks 12/11/15  Yes Historical Provider, MD  Calcium Carbonate Antacid (TUMS ULTRA 1000 PO) Take 2 tablets by mouth 3 (three) times daily before meals.   Yes Historical Provider, MD  carvedilol (COREG) 6.25 MG tablet Take 1 tablet (6.25 mg total) by mouth 2 (two) times daily with a meal. 05/01/16   Yes Sital Mody, MD  cinacalcet (SENSIPAR) 90 MG tablet Take 90 mg by mouth daily.   Yes Historical Provider, MD  hydrALAZINE (APRESOLINE) 50 MG tablet Take 1 tablet (50 mg total) by mouth 3 (three) times daily. 05/20/16 08/18/16 Yes Christopher End, MD  ipratropium (ATROVENT HFA) 17 MCG/ACT inhaler Inhale 2 puffs into the lungs every 4 (four) hours as needed for wheezing.   Yes Historical Provider, MD  isosorbide mononitrate (IMDUR) 30 MG 24 hr tablet Take 1 tablet (30 mg total) by mouth daily. 05/20/16 08/18/16 Yes Christopher End, MD  lidocaine-prilocaine (EMLA) cream Apply 1 application topically as needed (dialysis access).   Yes Historical Provider, MD    Allergies Sulfa antibiotics    Social History Social History  Substance Use Topics  . Smoking status: Current Some Day Smoker    Packs/day: 0.25    Years: 40.00    Types: Cigarettes  . Smokeless tobacco: Never Used  . Alcohol use No    Review of Systems Patient denies headaches, rhinorrhea, blurry vision, numbness, shortness of breath, chest pain, edema, cough, abdominal pain, nausea, vomiting, diarrhea, dysuria, fevers, rashes or hallucinations unless otherwise stated above in HPI. ____________________________________________   PHYSICAL EXAM:  VITAL SIGNS: Vitals:   06/06/16 2315 06/06/16 2321  BP: (!) 178/129 (!) 178/129  Pulse: 82   Resp: (!) 21   Temp:      Constitutional: Alert and oriented. Ill appearing in moderate respiratory distress Eyes: Conjunctivae are normal. PERRL. EOMI. Head: Atraumatic. Nose: No congestion/rhinnorhea. Mouth/Throat: Mucous membranes are moist.  Oropharynx non-erythematous. Neck: No stridor. Painless ROM. No cervical spine tenderness to palpation Hematological/Lymphatic/Immunilogical: No cervical lymphadenopathy. Cardiovascular: Normal rate, regular rhythm. Grossly normal heart sounds.  Good peripheral circulation. Respiratory: + use of accessory muscles, diffuse inspiratory and  expiratory wheeze with bibasilar crackles, no rhonchi Gastrointestinal: Soft and nontender. No distention. No abdominal bruits. No CVA tenderness. Genitourinary:  Musculoskeletal: No lower extremity tenderness, 2+ edema.  No joint effusions. Neurologic:  Normal speech and language. No gross focal neurologic deficits are appreciated. No gait instability. Skin:  Skin is warm, dry and intact. No rash noted. Psychiatric: Mood and affect are normal. Speech and behavior are normal.  ____________________________________________   LABS (all labs ordered are listed, but only abnormal results are displayed)  Results for orders placed or performed during the hospital encounter of 06/06/16 (from the past 24 hour(s))  CBC with Differential/Platelet     Status: Abnormal   Collection Time: 06/06/16  9:25 PM  Result Value Ref Range   WBC 6.0 3.8 - 10.6 K/uL   RBC 3.77 (L) 4.40 - 5.90 MIL/uL   Hemoglobin 11.4 (L) 13.0 - 18.0 g/dL   HCT 35.0 (L) 40.0 - 52.0 %   MCV 92.9 80.0 - 100.0 fL   MCH 30.3 26.0 - 34.0 pg   MCHC 32.7 32.0 - 36.0 g/dL   RDW 17.8 (H) 11.5 - 14.5 %   Platelets 189 150 - 440 K/uL   Neutrophils Relative % 76 %   Neutro Abs 4.5 1.4 - 6.5 K/uL   Lymphocytes Relative 11 %   Lymphs Abs 0.7 (L) 1.0 - 3.6  K/uL   Monocytes Relative 11 %   Monocytes Absolute 0.7 0.2 - 1.0 K/uL   Eosinophils Relative 1 %   Eosinophils Absolute 0.1 0 - 0.7 K/uL   Basophils Relative 1 %   Basophils Absolute 0.1 0 - 0.1 K/uL  Basic metabolic panel     Status: Abnormal   Collection Time: 06/06/16  9:25 PM  Result Value Ref Range   Sodium 139 135 - 145 mmol/L   Potassium 5.2 (H) 3.5 - 5.1 mmol/L   Chloride 104 101 - 111 mmol/L   CO2 19 (L) 22 - 32 mmol/L   Glucose, Bld 117 (H) 65 - 99 mg/dL   BUN 78 (H) 6 - 20 mg/dL   Creatinine, Ser 10.02 (H) 0.61 - 1.24 mg/dL   Calcium 8.8 (L) 8.9 - 10.3 mg/dL   GFR calc non Af Amer 5 (L) >60 mL/min   GFR calc Af Amer 6 (L) >60 mL/min   Anion gap 16 (H) 5 - 15    Brain natriuretic peptide     Status: Abnormal   Collection Time: 06/06/16  9:25 PM  Result Value Ref Range   B Natriuretic Peptide >4,500.0 (H) 0.0 - 100.0 pg/mL  Magnesium     Status: None   Collection Time: 06/06/16  9:25 PM  Result Value Ref Range   Magnesium 1.9 1.7 - 2.4 mg/dL  Blood gas, venous     Status: Abnormal   Collection Time: 06/06/16 10:46 PM  Result Value Ref Range   pH, Ven 7.35 7.250 - 7.430   pCO2, Ven 34 (L) 44.0 - 60.0 mmHg   pO2, Ven 80.0 (H) 32.0 - 45.0 mmHg   Bicarbonate 18.8 (L) 20.0 - 28.0 mmol/L   Acid-base deficit 6.1 (H) 0.0 - 2.0 mmol/L   O2 Saturation 95.1 %   Patient temperature 37.0    Collection site LINE    Sample type VENOUS    ____________________________________________  EKG My review and personal interpretation at Time: 21:22   Indication: sob  Rate: 80  Rhythm: sinus Axis: normal Other: inferolateral t wave changes consistent with previous EKG 3/29 ____________________________________________  RADIOLOGY  I personally reviewed all radiographic images ordered to evaluate for the above acute complaints and reviewed radiology reports and findings.  These findings were personally discussed with the patient.  Please see medical record for radiology report.  ____________________________________________   PROCEDURES  Procedure(s) performed:  Procedures    Critical Care performed: yes CRITICAL CARE Performed by: Merlyn Lot   Total critical care time: 40 minutes  Critical care time was exclusive of separately billable procedures and treating other patients.  Critical care was necessary to treat or prevent imminent or life-threatening deterioration.  Critical care was time spent personally by me on the following activities: development of treatment plan with patient and/or surrogate as well as nursing, discussions with consultants, evaluation of patient's response to treatment, examination of patient, obtaining history from  patient or surrogate, ordering and performing treatments and interventions, ordering and review of laboratory studies, ordering and review of radiographic studies, pulse oximetry and re-evaluation of patient's condition.  ____________________________________________   INITIAL IMPRESSION / ASSESSMENT AND PLAN / ED COURSE  Pertinent labs & imaging results that were available during my care of the patient were reviewed by me and considered in my medical decision making (see chart for details).  DDX: Asthma, copd, CHF, pna, ptx, malignancy, Pe, anemia   [name] is a 61 y.o. who presents to the ED with acute  shortness of breath with acute respiratory failure with hypoxia down to 85% on room air. Placed on supplemental O2 with improvement.  Patient is markedly tachypnic, but protecting his airway.  Otherwise afebrile. Hypertensive. Mix presentation given history of dialysis and congestive heart failure with smoking history. They some diffuse wheezing will give nebulizer treatments to evaluate for response to bronchodilator therapy. We'll treat blood pressure.  The patient will be placed on continuous pulse oximetry and telemetry for monitoring.  Laboratory evaluation will be sent to evaluate for the above complaints.     Clinical Course as of Jun 07 2327  Sun Jun 06, 2016  2240 MCV: 92.9 [PR]  2245 Patient reassessed. Did have some improvement in air movement after nebulizer treatment but still with worsening tachypnea. Based on his increased work of breathing and will place the patient on BiPAP.  [PR]    Clinical Course User Index [PR] Merlyn Lot, MD    ----------------------------------------- 11:29 PM on 06/06/2016 -----------------------------------------  This point patient will require admission for further evaluation and management of his acute dyspnea and hypoxia. Large clot is likely secondary to underlying congestive heart failure with edema however does seem to be  responding well to bronchodilators therefore will continue with nebulizers. Patient with improvement on BiPAP. A spoke with Dr. Ara Kussmaul regarding the patient's presentation and she kindly agrees to admit patient for further evaluation and management.  Have discussed with the patient and available family all diagnostics and treatments performed thus far and all questions were answered to the best of my ability. The patient demonstrates understanding and agreement with plan.  ____________________________________________   FINAL CLINICAL IMPRESSION(S) / ED DIAGNOSES  Final diagnoses:  Acute respiratory failure with hypoxia (HCC)  ESRD (end stage renal disease) on dialysis ([place])      NEW MEDICATIONS STARTED DURING THIS VISIT:  New Prescriptions   No medications on file     Note:  This document was prepared using Dragon voice recognition software and may include unintentional dictation errors.    Merlyn Lot, MD 06/06/16 2330

## 2016-06-07 ENCOUNTER — Encounter: Payer: Self-pay | Admitting: Adult Health

## 2016-06-07 DIAGNOSIS — J9601 Acute respiratory failure with hypoxia: Secondary | ICD-10-CM

## 2016-06-07 LAB — RENAL FUNCTION PANEL
ANION GAP: 18 — AB (ref 5–15)
Albumin: 3.4 g/dL — ABNORMAL LOW (ref 3.5–5.0)
BUN: 80 mg/dL — ABNORMAL HIGH (ref 6–20)
CALCIUM: 8.9 mg/dL (ref 8.9–10.3)
CO2: 21 mmol/L — AB (ref 22–32)
CREATININE: 10.59 mg/dL — AB (ref 0.61–1.24)
Chloride: 103 mmol/L (ref 101–111)
GFR, EST AFRICAN AMERICAN: 5 mL/min — AB (ref >60)
GFR, EST NON AFRICAN AMERICAN: 5 mL/min — AB (ref >60)
Glucose, Bld: 146 mg/dL — ABNORMAL HIGH (ref 65–99)
Phosphorus: 9.9 mg/dL — ABNORMAL HIGH (ref 2.5–4.6)
Potassium: 5.5 mmol/L — ABNORMAL HIGH (ref 3.5–5.1)
SODIUM: 142 mmol/L (ref 135–145)

## 2016-06-07 LAB — BASIC METABOLIC PANEL
Anion gap: 18 — ABNORMAL HIGH (ref 5–15)
BUN: 79 mg/dL — ABNORMAL HIGH (ref 6–20)
CALCIUM: 9 mg/dL (ref 8.9–10.3)
CHLORIDE: 104 mmol/L (ref 101–111)
CO2: 17 mmol/L — ABNORMAL LOW (ref 22–32)
CREATININE: 10.03 mg/dL — AB (ref 0.61–1.24)
GFR, EST AFRICAN AMERICAN: 6 mL/min — AB (ref >60)
GFR, EST NON AFRICAN AMERICAN: 5 mL/min — AB (ref >60)
Glucose, Bld: 144 mg/dL — ABNORMAL HIGH (ref 65–99)
Potassium: 5.5 mmol/L — ABNORMAL HIGH (ref 3.5–5.1)
SODIUM: 139 mmol/L (ref 135–145)

## 2016-06-07 LAB — URINE DRUG SCREEN, QUALITATIVE (ARMC ONLY)
Amphetamines, Ur Screen: NOT DETECTED
BARBITURATES, UR SCREEN: NOT DETECTED
BENZODIAZEPINE, UR SCRN: NOT DETECTED
CANNABINOID 50 NG, UR ~~LOC~~: NOT DETECTED
Cocaine Metabolite,Ur ~~LOC~~: POSITIVE — AB
MDMA (ECSTASY) UR SCREEN: NOT DETECTED
Methadone Scn, Ur: NOT DETECTED
Opiate, Ur Screen: NOT DETECTED
PHENCYCLIDINE (PCP) UR S: NOT DETECTED
Tricyclic, Ur Screen: NOT DETECTED

## 2016-06-07 LAB — CBC
HCT: 34.1 % — ABNORMAL LOW (ref 40.0–52.0)
HEMOGLOBIN: 11.4 g/dL — AB (ref 13.0–18.0)
MCH: 31 pg (ref 26.0–34.0)
MCHC: 33.4 g/dL (ref 32.0–36.0)
MCV: 92.7 fL (ref 80.0–100.0)
Platelets: 187 10*3/uL (ref 150–440)
RBC: 3.68 MIL/uL — ABNORMAL LOW (ref 4.40–5.90)
RDW: 17.7 % — ABNORMAL HIGH (ref 11.5–14.5)
WBC: 5.3 10*3/uL (ref 3.8–10.6)

## 2016-06-07 LAB — TROPONIN I
TROPONIN I: 0.2 ng/mL — AB (ref <0.03)
TROPONIN I: 0.17 ng/mL — AB (ref <0.03)
Troponin I: 0.16 ng/mL (ref <0.03)

## 2016-06-07 LAB — MRSA PCR SCREENING: MRSA BY PCR: NEGATIVE

## 2016-06-07 LAB — GLUCOSE, CAPILLARY: Glucose-Capillary: 126 mg/dL — ABNORMAL HIGH (ref 65–99)

## 2016-06-07 LAB — TSH: TSH: 2.627 u[IU]/mL (ref 0.350–4.500)

## 2016-06-07 LAB — INFLUENZA PANEL BY PCR (TYPE A & B)
INFLAPCR: NEGATIVE
Influenza B By PCR: NEGATIVE

## 2016-06-07 LAB — MAGNESIUM: MAGNESIUM: 2 mg/dL (ref 1.7–2.4)

## 2016-06-07 MED ORDER — HYDRALAZINE HCL 50 MG PO TABS
50.0000 mg | ORAL_TABLET | Freq: Three times a day (TID) | ORAL | Status: DC
  Administered 2016-06-07 – 2016-06-08 (×3): 50 mg via ORAL
  Filled 2016-06-07 (×3): qty 1

## 2016-06-07 MED ORDER — CALCIUM ACETATE (PHOS BINDER) 667 MG PO CAPS: 1334.0000 mg | ORAL_CAPSULE | Freq: Three times a day (TID) | ORAL | Status: DC | PRN

## 2016-06-07 MED ORDER — CALCIUM ACETATE (PHOS BINDER) 667 MG PO CAPS
2001.0000 mg | ORAL_CAPSULE | Freq: Three times a day (TID) | ORAL | Status: DC
  Administered 2016-06-08: 2001 mg via ORAL
  Filled 2016-06-07: qty 3

## 2016-06-07 MED ORDER — LIDOCAINE-PRILOCAINE 2.5-2.5 % EX CREA
1.0000 | TOPICAL_CREAM | CUTANEOUS | Status: DC | PRN
Start: 2016-06-07 — End: 2016-06-08
  Filled 2016-06-07: qty 5

## 2016-06-07 MED ORDER — CALCIUM CARBONATE ANTACID 500 MG PO CHEW
1000.0000 mg | CHEWABLE_TABLET | Freq: Three times a day (TID) | ORAL | Status: DC
  Administered 2016-06-07 – 2016-06-08 (×3): 1000 mg via ORAL
  Filled 2016-06-07 (×3): qty 2

## 2016-06-07 MED ORDER — ATORVASTATIN CALCIUM 20 MG PO TABS
40.0000 mg | ORAL_TABLET | Freq: Every day | ORAL | Status: DC
  Administered 2016-06-07: 40 mg via ORAL
  Filled 2016-06-07: qty 2

## 2016-06-07 MED ORDER — ONDANSETRON HCL 4 MG/2ML IJ SOLN
4.0000 mg | Freq: Four times a day (QID) | INTRAMUSCULAR | Status: DC | PRN
  Administered 2016-06-07: 4 mg via INTRAVENOUS
  Filled 2016-06-07: qty 2

## 2016-06-07 MED ORDER — HYDRALAZINE HCL 20 MG/ML IJ SOLN
10.0000 mg | Freq: Once | INTRAMUSCULAR | Status: AC
  Administered 2016-06-07: 10 mg via INTRAVENOUS
  Filled 2016-06-07: qty 1

## 2016-06-07 MED ORDER — CALCIUM ACETATE (PHOS BINDER) 667 MG PO CAPS
2001.0000 mg | ORAL_CAPSULE | Freq: Three times a day (TID) | ORAL | Status: DC
  Administered 2016-06-07 (×2): 2001 mg via ORAL
  Filled 2016-06-07 (×2): qty 3

## 2016-06-07 MED ORDER — SODIUM CHLORIDE 0.9% FLUSH: 3.0000 mL | INTRAVENOUS | Status: DC | PRN

## 2016-06-07 MED ORDER — CINACALCET HCL 30 MG PO TABS
90.0000 mg | ORAL_TABLET | Freq: Every day | ORAL | Status: DC
  Administered 2016-06-07: 90 mg via ORAL
  Filled 2016-06-07: qty 3

## 2016-06-07 MED ORDER — ASPIRIN EC 81 MG PO TBEC
81.0000 mg | DELAYED_RELEASE_TABLET | Freq: Every day | ORAL | Status: DC
  Administered 2016-06-07 – 2016-06-08 (×2): 81 mg via ORAL
  Filled 2016-06-07 (×2): qty 1

## 2016-06-07 MED ORDER — CARVEDILOL 6.25 MG PO TABS
6.2500 mg | ORAL_TABLET | Freq: Two times a day (BID) | ORAL | Status: DC
  Administered 2016-06-07 – 2016-06-08 (×2): 6.25 mg via ORAL
  Filled 2016-06-07 (×2): qty 1

## 2016-06-07 MED ORDER — CALCIUM ACETATE (PHOS BINDER) 667 MG PO CAPS
1334.0000 mg | ORAL_CAPSULE | ORAL | Status: DC
  Filled 2016-06-07: qty 2

## 2016-06-07 MED ORDER — ACETAMINOPHEN 325 MG PO TABS: 650.0000 mg | ORAL_TABLET | ORAL | Status: DC | PRN

## 2016-06-07 MED ORDER — SODIUM BICARBONATE 8.4 % IV SOLN
150.0000 meq | Freq: Once | INTRAVENOUS | Status: AC
  Administered 2016-06-07: 150 meq via INTRAVENOUS
  Filled 2016-06-07: qty 150

## 2016-06-07 MED ORDER — HEPARIN SODIUM (PORCINE) 5000 UNIT/ML IJ SOLN
5000.0000 [IU] | Freq: Three times a day (TID) | INTRAMUSCULAR | Status: DC
  Administered 2016-06-07 – 2016-06-08 (×4): 5000 [IU] via SUBCUTANEOUS
  Filled 2016-06-07 (×4): qty 1

## 2016-06-07 MED ORDER — CALCIUM ACETATE (PHOS BINDER) 667 MG PO CAPS: 1334.0000 mg | ORAL_CAPSULE | Freq: Three times a day (TID) | ORAL | Status: DC

## 2016-06-07 MED ORDER — DEXTROSE 50 % IV SOLN
50.0000 mL | Freq: Once | INTRAVENOUS | Status: AC
  Administered 2016-06-07: 50 mL via INTRAVENOUS
  Filled 2016-06-07: qty 50

## 2016-06-07 MED ORDER — INSULIN REGULAR HUMAN 100 UNIT/ML IJ SOLN
10.0000 [IU] | Freq: Once | INTRAMUSCULAR | Status: AC
  Administered 2016-06-07: 10 [IU] via INTRAVENOUS
  Filled 2016-06-07 (×2): qty 0.1

## 2016-06-07 MED ORDER — SODIUM CHLORIDE 0.9 % IV SOLN: 250.0000 mL | INTRAVENOUS | Status: DC | PRN

## 2016-06-07 MED ORDER — ALUM & MAG HYDROXIDE-SIMETH 200-200-20 MG/5ML PO SUSP
30.0000 mL | Freq: Once | ORAL | Status: AC
  Administered 2016-06-07: 30 mL via ORAL
  Filled 2016-06-07: qty 30

## 2016-06-07 MED ORDER — SODIUM CHLORIDE 0.9% FLUSH
3.0000 mL | Freq: Two times a day (BID) | INTRAVENOUS | Status: DC
  Administered 2016-06-07 (×3): 3 mL via INTRAVENOUS

## 2016-06-07 MED ORDER — ISOSORBIDE MONONITRATE ER 30 MG PO TB24
30.0000 mg | ORAL_TABLET | Freq: Every day | ORAL | Status: DC
  Administered 2016-06-07 – 2016-06-08 (×2): 30 mg via ORAL
  Filled 2016-06-07 (×2): qty 1

## 2016-06-07 MED ORDER — FUROSEMIDE 10 MG/ML IJ SOLN
80.0000 mg | Freq: Once | INTRAMUSCULAR | Status: AC
  Administered 2016-06-07: 80 mg via INTRAVENOUS
  Filled 2016-06-07: qty 8

## 2016-06-07 NOTE — Consult Note (Signed)
[facility] Cardiology Consultation Note  Patient ID: [name], MRN: [id number], DOB/AGE: 05/25/1955 61 y.o. Admit date: 06/06/2016   Date of Consult: 06/07/2016 Primary Physician: [facility] Primary Cardiologist: None  Chief Complaint:  Chief Complaint  Patient presents with  . Shortness of Breath   Reason for Consult: shortness of breath respiratory failure  HPI: 61 y.o. male with the known end-stage renal disease on dialysis with known acute on chronic systolic combined diastolic dysfunction with previous ejection fraction of 20% by recent echocardiogram last month. The patient was at dialysis and had acute onset of severe shortness of breath weakness and the lower blood pressure for which she did it appeared that the patient had acute on chronic systolic dysfunction with pulmonary edema. There was some respiratory failure but the patient did have improvements with appropriate medication management as well. Oxygenation did help quite a bit. The patient did have significant improvements and therefore is now back in dialysis and improving more. The patient's blood pressure now is more hemodynamically stable on hydralazine and the use of isosorbide and carvedilol. In addition to that the patient has had an elevated troponin more consistent with the demand ischemia and/or renal disease rather than acute coronary syndrome.  Past Medical History:  Diagnosis Date  . Chest pain    a. 09/2013 Myoview [facility]): basal inf defect more pronounced @ rest, likely artifact, EF 48%, low risk study.  . Chronic combined systolic and diastolic CHF (congestive heart failure) ([place])    a. 10/2013 Echo [facility]): EF 50%, diast dysfxn, triv AI/TR, mild MR;  b. 10/2014 Echo: EF 30-35%, diff HK, gr1 DD, mild AI, mild to mod MR/TR, mod dil LA, nl RV, mod to sev PAH;  c. 04/2016 Echo: EF 20-25%, sev dil LV, diff HK, gr3 DD, mod AI, MV syst bowing w/ prolapse, mod dil RV, mod TR.  [name] ESRD (end stage renal  disease) ([place])    a. MWF dialysis @ [facility].  . Hypertensive heart disease with CHF (congestive heart failure) ([place])   . Peripheral vascular disease ([place])   . Pulmonary hypertension ([place]) 04/2016      Surgical History:  Past Surgical History:  Procedure Laterality Date  . A/V SHUNT INTERVENTION N/A 04/06/2016   Procedure: A/V Shunt Intervention;  Surgeon: Katha Cabal, MD;  Location: [place] [facility];  Service: Cardiovascular;  Laterality: N/A;  . A/V SHUNTOGRAM Left 04/06/2016   Procedure: A/V Fistulagram;  Surgeon: Katha Cabal, MD;  Location: [place] [facility];  Service: Cardiovascular;  Laterality: Left;  . AV FISTULA PLACEMENT Left 2014  . PERIPHERAL VASCULAR CATHETERIZATION N/A 10/03/2014   Procedure: A/V Shuntogram/Fistulagram;  Surgeon: Algernon Huxley, MD;  Location: [place] [facility];  Service: Cardiovascular;  Laterality: N/A;  . PERIPHERAL VASCULAR CATHETERIZATION Left 10/03/2014   Procedure: A/V Shunt Intervention;  Surgeon: Algernon Huxley, MD;  Location: [place] [facility];  Service: Cardiovascular;  Laterality: Left;  . PERIPHERAL VASCULAR CATHETERIZATION Left 05/01/2015   Procedure: A/V Shuntogram/Fistulagram;  Surgeon: Algernon Huxley, MD;  Location: [place] [facility];  Service: Cardiovascular;  Laterality: Left;  . PERIPHERAL VASCULAR CATHETERIZATION N/A 05/01/2015   Procedure: A/V Shunt Intervention;  Surgeon: Algernon Huxley, MD;  Location: [place] [facility];  Service: Cardiovascular;  Laterality: N/A;     Home Meds: Prior to Admission medications   Medication Sig Start Date End Date Taking? Authorizing Provider  albuterol (PROVENTIL HFA;VENTOLIN HFA) 108 (90 Base) MCG/ACT inhaler Inhale  into the lungs every 4 (four) hours as needed for wheezing or shortness of breath.   Yes Historical Provider, MD  aspirin EC 81 MG tablet Take 1 tablet (81 mg total) by mouth daily. 05/20/16  Yes Nelva [place], MD  calcium acetate (PHOSLO) 667 MG capsule Take  1,334-2,001 mg by mouth 3 (three) times daily with meals. Take 2001 mgs with meals 3 times daily and 1334 with snacks 12/11/15  Yes Historical Provider, MD  Calcium Carbonate Antacid (TUMS ULTRA 1000 PO) Take 2 tablets by mouth 3 (three) times daily before meals.   Yes Historical Provider, MD  carvedilol (COREG) 6.25 MG tablet Take 1 tablet (6.25 mg total) by mouth 2 (two) times daily with a meal. 05/01/16  Yes Sital Mody, MD  cinacalcet (SENSIPAR) 90 MG tablet Take 90 mg by mouth daily.   Yes Historical Provider, MD  hydrALAZINE (APRESOLINE) 50 MG tablet Take 1 tablet (50 mg total) by mouth 3 (three) times daily. 05/20/16 08/18/16 Yes Christopher End, MD  ipratropium (ATROVENT HFA) 17 MCG/ACT inhaler Inhale 2 puffs into the lungs every 4 (four) hours as needed for wheezing.   Yes Historical Provider, MD  isosorbide mononitrate (IMDUR) 30 MG 24 hr tablet Take 1 tablet (30 mg total) by mouth daily. 05/20/16 08/18/16 Yes Christopher End, MD  lidocaine-prilocaine (EMLA) cream Apply 1 application topically as needed (dialysis access).   Yes Historical Provider, MD    Inpatient Medications:  . aspirin EC  81 mg Oral Daily  . atorvastatin  40 mg Oral q1800  . calcium acetate  2,001 mg Oral TID WC  . calcium carbonate  1,000 mg Oral TID AC  . carvedilol  6.25 mg Oral BID WC  . cinacalcet  90 mg Oral Q lunch  . heparin  5,000 Units Subcutaneous Q8H  . hydrALAZINE  50 mg Oral TID  . isosorbide mononitrate  30 mg Oral Daily  . sodium chloride flush  3 mL Intravenous Q12H     Allergies:  Allergies  Allergen Reactions  . Sulfa Antibiotics Rash    Social History   Social History  . Marital status: Widowed    Spouse name: N/A  . Number of children: N/A  . Years of education: N/A   Occupational History  . Not on file.   Social History Main Topics  . Smoking status: Current Some Day Smoker    Packs/day: 0.25    Years: 40.00    Types: Cigarettes  . Smokeless tobacco: Never Used  . Alcohol  use No  . Drug use: Yes    Types: Cocaine  . Sexual activity: Not on file   Other Topics Concern  . Not on file   Social History Narrative   Lives at home in [place] by himself.   Independent on ambulation but does not routinely exercise.     Family History  Problem Relation Age of Onset  . Hypertension Son   . Diabetes Son   . Hypertension Mother   . Diabetes Mother   . Diabetes Sister      Review of Systems Positive for Jaquelyn Bitter of breath weakness chest pain Negative for: General:  chills, fever, night sweats or weight changes.  Cardiovascular: PND orthopnea syncope dizziness  Dermatological skin lesions rashes Respiratory: Cough congestion Urologic: Frequent urination urination at night and hematuria Abdominal: negative for nausea, vomiting, diarrhea, bright red blood per rectum, melena, or hematemesis Neurologic: negative for visual changes, and/or hearing changes  All other systems reviewed and are otherwise  negative except as noted above.  Labs:  Recent Labs  06/07/16 0113 06/07/16 0800 06/07/16 1252  TROPONINI 0.20* 0.17* 0.16*   Lab Results  Component Value Date   WBC 5.3 06/07/2016   HGB 11.4 (L) 06/07/2016   HCT 34.1 (L) 06/07/2016   MCV 92.7 06/07/2016   PLT 187 06/07/2016    Recent Labs Lab 06/07/16 0800  NA 142  K 5.5*  CL 103  CO2 21*  BUN 80*  CREATININE 10.59*  CALCIUM 8.9  GLUCOSE 146*   Lab Results  Component Value Date   CHOL 101 11/07/2012   HDL 39 (L) 11/07/2012   LDLCALC 38 11/07/2012   TRIG 119 11/07/2012   No results found for: DDIMER  Radiology/Studies:  Dg Chest 2 View  Result Date: 06/06/2016 CLINICAL DATA:  Acute dyspnea, edema EXAM: CHEST  2 VIEW COMPARISON:  04/29/2016 chest radiograph. FINDINGS: Right internal jugular central venous catheter terminates at the cavoatrial junction. Stable cardiomediastinal silhouette with cardiomegaly and aortic atherosclerosis. No pneumothorax. No pleural effusion. Mild  pulmonary edema. No acute consolidative airspace disease. IMPRESSION: Mild congestive heart failure. Aortic atherosclerosis. Electronically Signed   By: Ilona Sorrel M.D.   On: 06/06/2016 22:48    EKG: Normal sinus rhythm with left ventricular hypertrophy and lateral T-wave inversions  Weights: Filed Weights   06/06/16 2211 06/07/16 0100 06/07/16 1610  Weight: 75.7 kg (166 lb 14.4 oz) 74.8 kg (164 lb 14.5 oz) 77.5 kg (170 lb 13.7 oz)     Physical Exam: Blood pressure (!) 154/99, pulse 72, temperature 97.6 F (36.4 C), temperature source Oral, resp. rate 19, height 5\' 5"  (1.651 m), weight 77.5 kg (170 lb 13.7 oz), SpO2 98 %. Body mass index is 28.43 kg/m. General: Well developed, well nourished, in no acute distress. Head eyes ears nose throat: Normocephalic, atraumatic, sclera non-icteric, no xanthomas, nares are without discharge. No apparent thyromegaly and/or mass  Lungs: Normal respiratory effort.  no wheezes, Basilar rales, no rhonchi.  Heart: RRR with normal S1 S2. no murmur gallop, no rub, PMI is normal size and placement, carotid upstroke normal without bruit, jugular venous pressure is normal Abdomen: Soft, non-tender, non-distended with normoactive bowel sounds. No hepatomegaly. No rebound/guarding. No obvious abdominal masses. Abdominal aorta is normal size without bruit Extremities: 1-2+ edema. no cyanosis, no clubbing, no ulcers  Peripheral : 2+ bilateral upper extremity pulses, 2+ bilateral femoral pulses, 2+ bilateral dorsal pedal pulse Neuro: Alert and oriented. No facial asymmetry. No focal deficit. Moves all extremities spontaneously. Musculoskeletal: Normal muscle tone without kyphosis Psych:  Responds to questions appropriately with a normal affect.    Assessment: 61 year old male with acute on chronic combined diastolic and systolic Dysfunction congestive heart failure multifactorial in nature including hypertension left ventricular hypertrophy renal failure without  evidence of myocardial infarction  Plan: 1. Proceed to dialysis again under better conditions with the oxygenation for further diuresis of pulmonary edema 2. No change in hydralazine carvedilol combination with isosorbide for acute on chronic*diastolic systolic dysfunction heart failure and hypertension control 3. High intensity cholesterol therapy with atorvastatin 4. Follow for improvements after dialysis and further adjustments of medication management after above  Signed, Corey Skains M.D. [facility] 06/07/2016, 6:04 PM

## 2016-06-07 NOTE — Care Management Note (Signed)
Case Management Note  Patient Details  Name: Khylen Riolo MRN: [id number] Date of Birth: 1955-07-27  Subjective/Objective:                  Met with patient to discuss discharge planning. RNCM consult received for CHF. Patient is a hemodialysis patient and goes to [facility] for treatment. He states he is independent with mobility. He lives alone. He depends on friends or [employer] for transportation. He denies need for home health. He has scales to weigh but admits that he does not weigh himself. No O2 at home. No problems paying for medications or getting to MD appointments per patient.   Action/Plan: Estill Bamberg with patient pathways/dialysis liaison notified of patient admission. No current RNCM needs however patient patient is a frequent flyer. He has everything in place to manage his health according to him.   Expected Discharge Date:                  Expected Discharge Plan:     In-House Referral:     Discharge planning Services  CM Consult  Post Acute Care Choice:    Choice offered to:  Patient  DME Arranged:    DME Agency:     HH Arranged:    [place] Agency:     Status of Service:  Completed, signed off  If discussed at [employer] of Stay Meetings, dates discussed:    Additional Comments:  Marshell Garfinkel, RN 06/07/2016, 11:16 AM

## 2016-06-07 NOTE — Progress Notes (Signed)
When pt arrived on the floor in room 223 pt was found having his home medication at bedside being Carvedilol 6.25 mg, Calcium acetate, Isosorbide 30 mg, and hydralazine 50 mg. RN educated pt that it is the floor's protocol that RN count medication in front of pt and then takes medication to pharmacy to be locked up until pt is discharged. Pt refuses RN to take medication to get locked up in pharmacy and stated, " I paid for these so they are staying with me." RN will relay medication being at pt.'s bedside to oncoming nurse. Medications are in blue bag at pt.'s bedside. Will monitor pt closely.   Chirstine Defrain [employer]

## 2016-06-07 NOTE — Progress Notes (Signed)
Subjective:  Patient known to our practice from previous admissions as an outpatient He presented for several days the shortness of breath He cut his treatment short by 30 min. on Friday He states that he might have done some overeating this weekend In the ER on arrival, his oxygen sats were 91% on room air.  He was nauseous and had an episode of vomiting.  With oxygen, saturation improved to 95%.  He was placed on BiPAP and sent to the ER. Potassium was mildly elevated at 5.5. Urine toxicity screen is positive for cocaine Patient states he smokes 3-5 cigarettes per day    Objective:  Vital signs in last 24 hours:  Temp:  [97.9 F (36.6 C)-98.9 F (37.2 C)] 98.7 F (37.1 C) (04/16 1452) Pulse Rate:  [57-88] 75 (04/16 1452) Resp:  [7-40] 18 (04/16 1452) BP: (135-178)/(79-146) 151/83 (04/16 1452) SpO2:  [85 %-100 %] 93 % (04/16 1452) Weight:  [74.8 kg (164 lb 14.5 oz)-75.7 kg (166 lb 14.4 oz)] 74.8 kg (164 lb 14.5 oz) (04/16 0100)  Weight change:  Filed Weights   06/06/16 2211 06/07/16 0100  Weight: 75.7 kg (166 lb 14.4 oz) 74.8 kg (164 lb 14.5 oz)    Intake/Output:    Intake/Output Summary (Last 24 hours) at 06/07/16 1642 Last data filed at 06/07/16 1200  Gross per 24 hour  Intake              240 ml  Output              250 ml  Net              -10 ml     Physical Exam: General: No acute distress sitting in the bed  HEENT Anicteric, moist oral mucous membranes  Neck supple  Pulm/lungs Normal breathing effort, clear to auscultation  CVS/Heart irregular, no rub or gallop  Abdomen:  Soft, nontender, nondistended  Extremities: No peripheral edema  Neurologic: Alert, oriented  Skin: vitiligo  Access: PermCath       Basic Metabolic Panel:   Recent Labs Lab 06/06/16 2125 06/07/16 0113 06/07/16 0800  NA 139 139 142  K 5.2* 5.5* 5.5*  CL 104 104 103  CO2 19* 17* 21*  GLUCOSE 117* 144* 146*  BUN 78* 79* 80*  CREATININE 10.02* 10.03* 10.59*  CALCIUM 8.8*  9.0 8.9  MG 1.9 2.0  --   PHOS  --   --  9.9*     CBC:  Recent Labs Lab 06/06/16 2125 06/07/16 0800  WBC 6.0 5.3  NEUTROABS 4.5  --   HGB 11.4* 11.4*  HCT 35.0* 34.1*  MCV 92.9 92.7  PLT 189 187      Microbiology:  Recent Results (from the past 720 hour(s))  MRSA PCR Screening     Status: None   Collection Time: 06/07/16  1:00 AM  Result Value Ref Range Status   MRSA by PCR NEGATIVE NEGATIVE Final    Comment:        The GeneXpert MRSA Assay (FDA approved for NASAL specimens only), is one component of a comprehensive MRSA colonization surveillance program. It is not intended to diagnose MRSA infection nor to guide or monitor treatment for MRSA infections.     Coagulation Studies: No results for input(s): LABPROT, INR in the last 72 hours.  Urinalysis: No results for input(s): COLORURINE, LABSPEC, PHURINE, GLUCOSEU, HGBUR, BILIRUBINUR, KETONESUR, PROTEINUR, UROBILINOGEN, NITRITE, LEUKOCYTESUR in the last 72 hours.  Invalid input(s): APPERANCEUR    Imaging:  Dg Chest 2 View  Result Date: 06/06/2016 CLINICAL DATA:  Acute dyspnea, edema EXAM: CHEST  2 VIEW COMPARISON:  04/29/2016 chest radiograph. FINDINGS: Right internal jugular central venous catheter terminates at the cavoatrial junction. Stable cardiomediastinal silhouette with cardiomegaly and aortic atherosclerosis. No pneumothorax. No pleural effusion. Mild pulmonary edema. No acute consolidative airspace disease. IMPRESSION: Mild congestive heart failure. Aortic atherosclerosis. Electronically Signed   By: Ilona Sorrel M.D.   On: 06/06/2016 22:48     Medications:    . aspirin EC  81 mg Oral Daily  . atorvastatin  40 mg Oral q1800  . calcium acetate  2,001 mg Oral TID WC  . calcium carbonate  1,000 mg Oral TID AC  . carvedilol  6.25 mg Oral BID WC  . cinacalcet  90 mg Oral Q lunch  . heparin  5,000 Units Subcutaneous Q8H  . hydrALAZINE  50 mg Oral TID  . isosorbide mononitrate  30 mg Oral Daily   . sodium chloride flush  3 mL Intravenous Q12H   sodium chloride, acetaminophen, calcium acetate **AND** calcium acetate, lidocaine-prilocaine, ondansetron (ZOFRAN) IV, sodium chloride flush  Assessment/ Plan:  61 y.o.AA male with hypertension, hepatitis C, ESRD started HD 10/13, anemia of CKD, admission for diarrhea/hyperkalemia 10/2012, admission for GI bleed 10/2012, SHPTH, GI bleed 3/15, seizure episode,    1.  End-stage renal disease [facility], [place], [place]; MWF; [facility] Resume normal dialysis schedule Outpatient EDW 73.5 kg Present weight 77.5. Goal UF 3-4 kg as tolerated  2. AOCKD Hgb 11.4 Monitor  3. SHPTH  Low phos/renal diet Continue home dose of binders  4. Chronic SOB Multifactorial from Chronic systolic CHF EF 43-56 %, fluid overload and Cocaine abuse    LOS: 1 [name] 4/16/20184:42 PM

## 2016-06-07 NOTE — Progress Notes (Signed)
Post hd VS.

## 2016-06-07 NOTE — Progress Notes (Signed)
eLink Physician-Brief Progress Note Patient Name: [name] DOB: 12-31-1955 MRN: [id number]   Date of Service  06/07/2016  HPI/Events of Note  New patient evaluation. Patient presents with acute on chronic dyspnea. He has end-stage renal disease, gets dialyzed Monday Wednesday Friday. He has combined systolic and diastolic heart failure.   Labs consistent with volume overload. Chest x-ray with pulmonary edema.   Patient seen, comfortable, not in distress. 2 L nasal cannula. Blood pressure 170/100, heart rate 85, respiratory rate 24, O2 saturation 93% on nasal cannula.   eICU Interventions  Continue current management.   Patient was started on BiPAP at the ER. Continue BiPAP when necessary.   Plan for hemodialysis.      Intervention Category Evaluation Type: New Patient Evaluation  [place] 06/07/2016, 1:15 AM

## 2016-06-07 NOTE — Progress Notes (Signed)
Pt being transferred to room 223. Report called to Ander Purpura, RN. Pt and belongings transferred to room 223 without incident.

## 2016-06-07 NOTE — Progress Notes (Signed)
Hd start 

## 2016-06-07 NOTE — Consult Note (Signed)
Cardiology Consult    Patient ID: [name] MRN: [id number], DOB/AGE: 10/05/1955   Admit date: 06/06/2016 Date of Consult: 06/07/2016  Primary Physician: [place] Primary Cardiologist: Andree Coss, MD  Requesting Provider: Governor Specking, MD  Patient Profile    [name] is a 61 y.o. male with a history of progressive dyspnea and exertional chest pain, progressively worsening LV dysfxn/HFrEF, ESRD on HD, hypertensive heart dzs, PAH, and PVD, who is being seen today for the evaluation of chest pain and chf at the request of Dr. Governor Specking.  Past Medical History   Past Medical History:  Diagnosis Date  . Chest pain    a. 09/2013 Myoview [facility]): basal inf defect more pronounced @ rest, likely artifact, EF 48%, low risk study.  . Chronic combined systolic and diastolic CHF (congestive heart failure) ([place])    a. 10/2013 Echo [facility]): EF 50%, diast dysfxn, triv AI/TR, mild MR;  b. 10/2014 Echo: EF 30-35%, diff HK, gr1 DD, mild AI, mild to mod MR/TR, mod dil LA, nl RV, mod to sev PAH;  c. 04/2016 Echo: EF 20-25%, sev dil LV, diff HK, gr3 DD, mod AI, MV syst bowing w/ prolapse, mod dil RV, mod TR.  [name] ESRD (end stage renal disease) ([place])    a. MWF dialysis @ [facility].  . Hypertensive heart disease with CHF (congestive heart failure) ([place])   . Peripheral vascular disease ([place])   . Pulmonary hypertension ([place]) 04/2016    Past Surgical History:  Procedure Laterality Date  . A/V SHUNT INTERVENTION N/A 04/06/2016   Procedure: A/V Shunt Intervention;  Surgeon: Katha Cabal, MD;  Location: [place] [facility];  Service: Cardiovascular;  Laterality: N/A;  . A/V SHUNTOGRAM Left 04/06/2016   Procedure: A/V Fistulagram;  Surgeon: Katha Cabal, MD;  Location: [place] [facility];  Service: Cardiovascular;  Laterality: Left;  . AV FISTULA PLACEMENT Left 2014  . PERIPHERAL VASCULAR CATHETERIZATION N/A 10/03/2014   Procedure: A/V Shuntogram/Fistulagram;  Surgeon: Algernon Huxley,  MD;  Location: [place] [facility];  Service: Cardiovascular;  Laterality: N/A;  . PERIPHERAL VASCULAR CATHETERIZATION Left 10/03/2014   Procedure: A/V Shunt Intervention;  Surgeon: Algernon Huxley, MD;  Location: [place] [facility];  Service: Cardiovascular;  Laterality: Left;  . PERIPHERAL VASCULAR CATHETERIZATION Left 05/01/2015   Procedure: A/V Shuntogram/Fistulagram;  Surgeon: Algernon Huxley, MD;  Location: [place] [facility];  Service: Cardiovascular;  Laterality: Left;  . PERIPHERAL VASCULAR CATHETERIZATION N/A 05/01/2015   Procedure: A/V Shunt Intervention;  Surgeon: Algernon Huxley, MD;  Location: [place] [facility];  Service: Cardiovascular;  Laterality: N/A;     Allergies  Allergies  Allergen Reactions  . Sulfa Antibiotics Rash    History of Present Illness    61 y/o ? with the above complex PMH including HFrEF, chest pain, HTN, ESRD, PVD, and PAH.  He has had previous cardiac evaluation @ [facility] in the setting of renal tx w/u with a neg MV in 2015.  At that time, his LV fxn was nl, however subsequent echoes in 2016 (EF 30-35%) and more recently in 04/2016 (EF 20-25%) have shown progressive worsening of LV fxn.  He was admitted in 04/2016 with dyspnea and edema with mild troponin elevation, which was felt to be due to demand ischemia.  He subsequently f/u with Dr. Saunders Revel in clinic in late March with recommendation for ischemic eval given c/o dyspnea w/ minimal exertion and also intermittent rest and exertional c/p.  Pt declined further ischemic eval and was placed on asa along with hydralazine for better bp control.  Unfortunately, he continued to have dyspnea with minimal exertional and daily, intermittent, rest and exertional chest discomfort.  He has been compliant with meds and also dialysis sessions.  He has never had c/p @ dialysis.  On 4/15, he presented to the ED due to increasing dyspnea, orthopnea, and lower ext edema.  He also reported c/p.  He was hypertensive upon arrival and required  BiPAP (O2 sat 85% on RA).  He was treated with inhalers, steroids, and hydralazine in the setting of HTN (170's).  CXR showed mild vascular congestion.  He was admitted to ICU and treated with IV lasix.  K was elevated and he was given insulin/d50.  Troponin returned @ 0.20  0.17  0.16.  He was able to be weaned from BiPAP and has been breathing better.  He has been transferred to the floor.  He currently denies c/p or dyspnea at rest.  Inpatient Medications    . aspirin EC  81 mg Oral Daily  . calcium acetate  2,001 mg Oral TID WC  . calcium carbonate  1,000 mg Oral TID AC  . carvedilol  6.25 mg Oral BID WC  . cinacalcet  90 mg Oral Q lunch  . heparin  5,000 Units Subcutaneous Q8H  . hydrALAZINE  50 mg Oral TID  . isosorbide mononitrate  30 mg Oral Daily  . sodium chloride flush  3 mL Intravenous Q12H    Family History    Family History  Problem Relation Age of Onset  . Hypertension Son   . Diabetes Son   . Hypertension Mother   . Diabetes Mother   . Diabetes Sister     Social History    Social History   Social History  . Marital status: Widowed    Spouse name: N/A  . Number of children: N/A  . Years of education: N/A   Occupational History  . Not on file.   Social History Main Topics  . Smoking status: Current Some Day Smoker    Packs/day: 0.25    Years: 40.00    Types: Cigarettes  . Smokeless tobacco: Never Used  . Alcohol use No  . Drug use: Yes    Types: Cocaine  . Sexual activity: Not on file   Other Topics Concern  . Not on file   Social History Narrative   Lives at home in [place] by himself.   Independent on ambulation but does not routinely exercise.     Review of Systems    General:  No chills, fever, night sweats or weight changes.  Cardiovascular:  +++ rest and exertional chest pain, +++ progressive dyspnea on exertion, +++ lower ext edema, +++ orthopnea, no palpitations, paroxysmal nocturnal dyspnea. Dermatological: No rash,  lesions/masses Respiratory: +++ cough, +++ dyspnea Urologic: No hematuria, dysuria Abdominal:   No nausea, vomiting, diarrhea, bright red blood per rectum, melena, or hematemesis Neurologic:  No visual changes, wkns, changes in mental status. All other systems reviewed and are otherwise negative except as noted above.  Physical Exam    Blood pressure (!) 151/83, pulse 75, temperature 98.7 F (37.1 C), temperature source Oral, resp. rate 18, height 5\' 5"  (1.651 m), weight 164 lb 14.5 oz (74.8 kg), SpO2 93 %.  General: Pleasant, NAD Psych: Normal affect. Neuro: Alert and oriented X 3. Moves all extremities spontaneously. HEENT: Normal  Neck: Supple without bruits.  JVP ~ 12 cm.  Lungs:  Resp regular and unlabored, diminished breath sounds throughout. Heart: RRR, distant, no s3, s4, or murmurs. Abdomen: Soft, non-tender, non-distended, BS + x 4.  Extremities: No clubbing, cyanosis or edema. DP/PT/Radials 2+ and equal bilaterally.  Labs     Recent Labs  06/07/16 0113 06/07/16 0800 06/07/16 1252  TROPONINI 0.20* 0.17* 0.16*   Lab Results  Component Value Date   WBC 5.3 06/07/2016   HGB 11.4 (L) 06/07/2016   HCT 34.1 (L) 06/07/2016   MCV 92.7 06/07/2016   PLT 187 06/07/2016    Recent Labs Lab 06/07/16 0800  NA 142  K 5.5*  CL 103  CO2 21*  BUN 80*  CREATININE 10.59*  CALCIUM 8.9  GLUCOSE 146*   Lab Results  Component Value Date   CHOL 101 11/07/2012   HDL 39 (L) 11/07/2012   LDLCALC 38 11/07/2012   TRIG 119 11/07/2012     Radiology Studies    Dg Chest 2 View  Result Date: 06/06/2016 CLINICAL DATA:  Acute dyspnea, edema EXAM: CHEST  2 VIEW COMPARISON:  04/29/2016 chest radiograph. FINDINGS: Right internal jugular central venous catheter terminates at the cavoatrial junction. Stable cardiomediastinal silhouette with cardiomegaly and aortic atherosclerosis. No pneumothorax. No pleural effusion. Mild pulmonary edema. No acute consolidative airspace disease.  IMPRESSION: Mild congestive heart failure. Aortic atherosclerosis. Electronically Signed   By: Ilona Sorrel M.D.   On: 06/06/2016 22:48    ECG & Cardiac Imaging    rsr, 80, LVH w/ repol abnormalities, no acute st/t changes.  Assessment & Plan    1.  Acute on chronic combined systolic and diastolic CHF/Cardiomyopathy:  Pt noted to have progressive worsening of LV fxn since 2015 (EF 50% in 2015, 30-35% in 2016, 20-25% in 04/2016).  With this he has had progressive DOE and has also noted intermittent rest and exertional chest pain.  He was admitted in 11/2014, June and November 2017, and March 2018 with CHF/pulmonary edema.  He was seen in clinic by Dr. Saunders Revel in late March with recommendation for ischemic eval, ideally cardiac cath, however at that time, [name] declined.  Given progression of symptoms, he is now reconsidering.  Currently he denies dyspnea @ rest.  He is awaiting nephrology eval, as today is his nl HD day.  Cont  blocker, hydral/nitrate.  2.  Unstable Angina/elevated troponin:  Pt has been having rest and exertional assoc with progressive DOE @ home despite medication and HD compliance.  Symptoms concerning for ischemia in this gentleman with multiple RF for CAD.  He is currently c/p free.  Troponin elevation is mild and flat, likely representing demand ischemia.  He is now reconsidering diagnostic catheterization, which could be performed while he is here if he is agreeable and will provide [place] with significant information to help [place] to guide his care going forward.  Cont asa,  blocker.  Will add statin.  3.  Hypertensive Heart Dzs:  BPs trending 150's to 160's.  Cont  blocker, hydral/nitrate.  Awaiting HD.  Can likely titrate hydralazine to 75 TID, but would like to see what his pressure does with HD prior to doing so.  4.  Lipids:  Lipid profile from [facility] 11/2015  LDL 83.  Will add statin in setting of presumed CAD/cardiomyopathy with known PVD.  5. Hyperkalemia:  Treatment per  CCM earlier.  f/u after HD.  Signed, Murray Hodgkins, NP 06/07/2016, 3:04 PM

## 2016-06-07 NOTE — Consult Note (Signed)
PULMONARY / CRITICAL CARE MEDICINE   Name: [name] MRN: [id number] DOB: 1955/07/31    ADMISSION DATE:  06/06/2016   CONSULTATION DATE: 06/07/16  REFERRING MD:  Dr. Ara Kussmaul  REASON FOR CONSULTATION: Management of Acute hypoxemic respiratory failure requiring BiPAP  CHIEF COMPLAINT:  Dyspnea  HISTORY OF PRESENT ILLNESS:   This is a 61 y/o AA male with a PMH of ESRD on HD M-W-F, congestive heart failure, cocaine abuse,  hypertension and PVD who presented to the ED via EMS with complaints of worsening dyspnea/exertional dyspnea, productive cough and chest pain. Patient states that symptoms started on Friday after HD and gradually got worse.  Associated symptoms include orthopnea and a non-productive cough. He reports moderate improvement with  BiPAP, Duoneb nebs and steroids. He is now off BiPAP. His labs showed mild hyperkalemia, severely elevated, proBNP, mildly elevated troponin of 0.2 and CXR showed mild vascular congestion. SPO2 upon ED presentation was 85% on room air with a respiratory rate of 30. Upon arrival in the ICU, his syystolic blood pressure was in the 170s. He is on coreg, hydralazine and imdur at home and reports taking all medications as prescribed.  PAST MEDICAL HISTORY :  He  has a past medical history of CHF (congestive heart failure) ([place]); ESRD (end stage renal disease) ([place]); HTN (hypertension) (08/02/2015); and Peripheral vascular disease ([place]).  PAST SURGICAL HISTORY: He  has a past surgical history that includes AV fistula placement (Left, 2014); Cardiac catheterization (N/A, 10/03/2014); Cardiac catheterization (Left, 10/03/2014); Cardiac catheterization (Left, 05/01/2015); Cardiac catheterization (N/A, 05/01/2015); A/V Fistulagram (Left, 04/06/2016); and A/V Shunt Intervention (N/A, 04/06/2016).  Allergies  Allergen Reactions  . Sulfa Antibiotics Rash    No current facility-administered medications on file prior to encounter.    Current Outpatient Prescriptions  on File Prior to Encounter  Medication Sig  . aspirin EC 81 MG tablet Take 1 tablet (81 mg total) by mouth daily.  . calcium acetate (PHOSLO) 667 MG capsule Take 1,334-2,001 mg by mouth 3 (three) times daily with meals. Take 2001 mgs with meals 3 times daily and 1334 with snacks  . Calcium Carbonate Antacid (TUMS ULTRA 1000 PO) Take 2 tablets by mouth 3 (three) times daily before meals.  . carvedilol (COREG) 6.25 MG tablet Take 1 tablet (6.25 mg total) by mouth 2 (two) times daily with a meal.  . cinacalcet (SENSIPAR) 90 MG tablet Take 90 mg by mouth daily.  . hydrALAZINE (APRESOLINE) 50 MG tablet Take 1 tablet (50 mg total) by mouth 3 (three) times daily.  . isosorbide mononitrate (IMDUR) 30 MG 24 hr tablet Take 1 tablet (30 mg total) by mouth daily.  [name] lidocaine-prilocaine (EMLA) cream Apply 1 application topically as needed (dialysis access).    FAMILY HISTORY:  His indicated that his mother is deceased. He indicated that his sister is alive. He indicated that his son is alive.    SOCIAL HISTORY: He  reports that he has been smoking Cigarettes.  He has a 10.00 pack-year smoking history. He has never used smokeless tobacco. He reports that he does not drink alcohol or use drugs.  REVIEW OF SYSTEMS:   Constitutional: Negative for fever and chills.  HENT: Negative for congestion and rhinorrhea.  Eyes: Negative for redness and visual disturbance.  Respiratory: Positive for shortness of breath, non-productive cough but negative for wheezing.  Cardiovascular: Positive for chest pain, orthopnea but negative for palpitations.  Gastrointestinal: Negative  for nausea , vomiting and abdominal pain and loose stools Genitourinary: Negative for  dysuria and urgency.  Endocrine: Denies polyuria, polyphagia and heat intolerance Musculoskeletal: positive for leg cramps but negative for joint swelling.  Skin: Negative for pallor and wound.  Neurological: Negative for dizziness and headaches    SUBJECTIVE:    VITAL SIGNS: BP (!) 137/112   Pulse 80   Temp 97.9 F (36.6 C) (Axillary)   Resp 20   Ht 5\' 5"  (1.651 m)   Wt 164 lb 14.5 oz (74.8 kg)   SpO2 97%   BMI 27.44 kg/m   HEMODYNAMICS:    VENTILATOR SETTINGS:    INTAKE / OUTPUT: No intake/output data recorded.  PHYSICAL EXAMINATION: General: Well nourished, NAD Neuro: AAO X4, no focal deficits HEENT: [place]/AT, PERRLA, oral mucosa pink, trachea midline Cardiovascular:  RRR, S1/S2, no MRG, +2 pulses, no edema Lungs: Normal WOB, bilateral breath sounds, diminished in the bases, find bibasilar crackles Abdomen: Non-distended, normal bowel sounds Musculoskeletal:  +rom, no deformities Skin: warm and dry, venous stasis discoloration in BLLE  LABS:  BMET  Recent Labs Lab 06/06/16 2125 06/07/16 0113  NA 139 139  K 5.2* 5.5*  CL 104 104  CO2 19* 17*  BUN 78* 79*  CREATININE 10.02* 10.03*  GLUCOSE 117* 144*    Electrolytes  Recent Labs Lab 06/06/16 2125 06/07/16 0113  CALCIUM 8.8* 9.0  MG 1.9 2.0    CBC  Recent Labs Lab 06/06/16 2125  WBC 6.0  HGB 11.4*  HCT 35.0*  PLT 189    Coag's No results for input(s): APTT, INR in the last 168 hours.  Sepsis Markers No results for input(s): LATICACIDVEN, PROCALCITON, O2SATVEN in the last 168 hours.  ABG No results for input(s): PHART, PCO2ART, PO2ART in the last 168 hours.  Liver Enzymes No results for input(s): AST, ALT, ALKPHOS, BILITOT, ALBUMIN in the last 168 hours.  Cardiac Enzymes  Recent Labs Lab 06/07/16 0113  TROPONINI 0.20*    Glucose  Recent Labs Lab 06/07/16 0103  GLUCAP 126*    Imaging Dg Chest 2 View  Result Date: 06/06/2016 CLINICAL DATA:  Acute dyspnea, edema EXAM: CHEST  2 VIEW COMPARISON:  04/29/2016 chest radiograph. FINDINGS: Right internal jugular central venous catheter terminates at the cavoatrial junction. Stable cardiomediastinal silhouette with cardiomegaly and aortic atherosclerosis. No  pneumothorax. No pleural effusion. Mild pulmonary edema. No acute consolidative airspace disease. IMPRESSION: Mild congestive heart failure. Aortic atherosclerosis. Electronically Signed   By: Ilona Sorrel M.D.   On: 06/06/2016 22:48    STUDIES:  2 D echo from 04/2016 Showed left ventricular ejection fraction of 20-25%, severe pulmonary hypertension, grade 3 diastolic dysfunction and severe systolic dysfunction stopped it was equally moderate aortic and tricuspid regurgitation and severe mitral regurgitation.  CULTURES: Influenza swab  ANTIBIOTICS: None  SIGNIFICANT EVENTS: 06/06/2016: ED with worsening dyspnea/exertional dyspnea, acute hypoxemic respiratory failure and uncontrolled hypertension.   LINES/TUBES: Peripheral IVs  DISCUSSION: This is a 61 year old African-American male presenting with worsening dyspnea/exertional dyspnea, acute hypoxemic respiratory failure requiring BiPAP, mild hyperkalemia and acute on chronic systolic and diastolic heart failure failure. Patient has a history of cocaine abuse as well as nonadherence with dietary recommendations. These are the most likely factors contributing to symptoms. He is now improved and off BiPAP  ASSESSMENT  Acute hypoxemic respiratory failure. Acute on chronic systolic and diastolic heart failure. Uncontrolled hypertension. Unproductive cough Hyperkalemia Troponin leak of 0.2 End-stage renal disease on hemodialysis. History of peripheral vascular disease  PLAN Continue supplemental oxygen via nasal cannula as tolerated Lasix 80 mg IV 1 Regular  insulin 10 units IV 1 and dextrose 50% 1 amp IV 1. Repeat potassium level. Nephrology following, Hemodialysis today per nephrology Continue Hydralazine, Coreg, nitroglycerin topical and Imdur  Hemodynamic monitoring per ICU protocol. Urine toxicology Patient is not requiring continuous BiPAP anymore.  Transfer out of the ICU   FAMILY  - Updates: No family at bedside.  Patient updated on current treatment plan  - Inter-disciplinary family meet or Palliative Care meeting due by:  day 7  Plan of care discussed with [facility] physician and ON-CALL ATTENDING  Magdalene S. [facility] ANP-BC Pulmonary and Critical Care Medicine [facility] Pager [phone] or [phone] 06/07/2016, 4:08 AM   PCCM ATTENDING ATTESTATION:  I have evaluated patient with the APP [place], reviewed database in its entirety and discussed care plan in detail. I agree with the above findings, assessment and plan. This patient was discussed on multidisciplinary rounds.   After transfer, PCCM will sign off. Please call if we can be of further assistance  Merton Border, MD PCCM service Mobile [phone] Pager [phone] 06/07/2016 9:45 AM

## 2016-06-07 NOTE — Progress Notes (Signed)
Pre hd info 

## 2016-06-07 NOTE — Progress Notes (Signed)
[place] [place] NAME: [name]    MR#:  [id number]  DATE OF BIRTH:  1955-10-22  SUBJECTIVEadmitted for SOB/acute on chronic combined heart failure.admitted to ICU ,for BIPAP.saw him today.sob improved.on [place].vitals stable.no complaints.  CHIEF COMPLAINT:   Chief Complaint  Patient presents with  . Shortness of Breath    REVIEW OF SYSTEMS:    ROS  Nutrition:  Tolerating Diet: Tolerating PT:      DRUG ALLERGIES:   Allergies  Allergen Reactions  . Sulfa Antibiotics Rash    VITALS:  Blood pressure (!) 154/78, pulse 72, temperature 97.6 F (36.4 C), temperature source Oral, resp. rate 18, height 5\' 5"  (1.651 m), weight 77.5 kg (170 lb 13.7 oz), SpO2 96 %.  PHYSICAL EXAMINATION:   Physical Exam  GENERAL:  61 y.o.-year-old patient lying in the bed with no acute distress.  EYES: Pupils equal, round, reactive to light and accommodation. No scleral icterus. Extraocular muscles intact.  HEENT: Head atraumatic, normocephalic. Oropharynx and nasopharynx clear.  NECK:  Supple, no jugular venous distention. No thyroid enlargement, no tenderness.  LUNGS: Normal breath sounds bilaterally, no wheezing, rales,rhonchi or crepitation. No use of accessory muscles of respiration.  CARDIOVASCULAR: S1, S2 normal. No murmurs, rubs, or gallops.  ABDOMEN: Soft, nontender, nondistended. Bowel sounds present. No organomegaly or mass.  EXTREMITIES: No pedal edema, cyanosis, or clubbing.  NEUROLOGIC: Cranial nerves II through XII are intact. Muscle strength 5/5 in all extremities. Sensation intact. Gait not checked.  PSYCHIATRIC: The patient is alert and oriented x 3.  SKIN: No obvious rash, lesion, or ulcer.    LABORATORY PANEL:   CBC  Recent Labs Lab 06/07/16 0800  WBC 5.3  HGB 11.4*  HCT 34.1*  PLT 187    ------------------------------------------------------------------------------------------------------------------  Chemistries   Recent Labs Lab 06/07/16 0113 06/07/16 0800  NA 139 142  K 5.5* 5.5*  CL 104 103  CO2 17* 21*  GLUCOSE 144* 146*  BUN 79* 80*  CREATININE 10.03* 10.59*  CALCIUM 9.0 8.9  MG 2.0  --    ------------------------------------------------------------------------------------------------------------------  Cardiac Enzymes  Recent Labs Lab 06/07/16 1252  TROPONINI 0.16*   ------------------------------------------------------------------------------------------------------------------  RADIOLOGY:  Dg Chest 2 View  Result Date: 06/06/2016 CLINICAL DATA:  Acute dyspnea, edema EXAM: CHEST  2 VIEW COMPARISON:  04/29/2016 chest radiograph. FINDINGS: Right internal jugular central venous catheter terminates at the cavoatrial junction. Stable cardiomediastinal silhouette with cardiomegaly and aortic atherosclerosis. No pneumothorax. No pleural effusion. Mild pulmonary edema. No acute consolidative airspace disease. IMPRESSION: Mild congestive heart failure. Aortic atherosclerosis. Electronically Signed   By: Ilona Sorrel M.D.   On: 06/06/2016 22:48     ASSESSMENT AND PLAN:   Active Problems:   Acute respiratory failure with hypoxia (HCC)  Acute respiratory failure  Due to acute on chronic systolic and diastolic heart TDVVOHY;07 sats 91%on RA/ when he came.now   94% RA clinically better;continue coreg,IMDur,nitropaste Cardio following. 2.ESRD;on HD Monday,Wednesday,Friday 3.htn;controled 4.AV fistula malfunctioning;has Perma cath in chest.vascular consulted for this.    All the records are reviewed and case discussed with Care Management/Social Workerr. Management plans discussed with the patient, family and they are in agreement.  CODE STATUS:full  TOTAL TIME TAKING CARE OF THIS PATIENT: 35 minutes.   POSSIBLE D/C IN 1-2 DAYS, DEPENDING ON  CLINICAL CONDITION.   Epifanio Lesches M.D on 06/07/2016 at 7:20 PM  Between 7am to 6pm - Pager - [phone]  After 6pm go to [email or web address] - [employer]  [facility]  Office  [phone]  CC: Primary care physician; [place]

## 2016-06-07 NOTE — Progress Notes (Signed)
Critical troponin level was called to nurse. This nurse called NP and made her aware of new lab. No order given at this time. Will continue to monitor patient closely.

## 2016-06-07 NOTE — Progress Notes (Signed)
Pre hd assessment  

## 2016-06-08 DIAGNOSIS — R748 Abnormal levels of other serum enzymes: Secondary | ICD-10-CM

## 2016-06-08 DIAGNOSIS — I5043 Acute on chronic combined systolic (congestive) and diastolic (congestive) heart failure: Secondary | ICD-10-CM

## 2016-06-08 DIAGNOSIS — R7989 Other specified abnormal findings of blood chemistry: Secondary | ICD-10-CM

## 2016-06-08 DIAGNOSIS — N186 End stage renal disease: Secondary | ICD-10-CM

## 2016-06-08 DIAGNOSIS — R778 Other specified abnormalities of plasma proteins: Secondary | ICD-10-CM

## 2016-06-08 DIAGNOSIS — Z992 Dependence on renal dialysis: Secondary | ICD-10-CM

## 2016-06-08 DIAGNOSIS — I10 Essential (primary) hypertension: Secondary | ICD-10-CM

## 2016-06-08 LAB — BASIC METABOLIC PANEL
ANION GAP: 10 (ref 5–15)
BUN: 43 mg/dL — ABNORMAL HIGH (ref 6–20)
CHLORIDE: 102 mmol/L (ref 101–111)
CO2: 27 mmol/L (ref 22–32)
Calcium: 8.7 mg/dL — ABNORMAL LOW (ref 8.9–10.3)
Creatinine, Ser: 6.81 mg/dL — ABNORMAL HIGH (ref 0.61–1.24)
GFR calc non Af Amer: 8 mL/min — ABNORMAL LOW (ref >60)
GFR, EST AFRICAN AMERICAN: 9 mL/min — AB (ref >60)
Glucose, Bld: 110 mg/dL — ABNORMAL HIGH (ref 65–99)
Potassium: 4.3 mmol/L (ref 3.5–5.1)
Sodium: 139 mmol/L (ref 135–145)

## 2016-06-08 LAB — HEPATITIS B SURFACE ANTIBODY,QUALITATIVE: Hep B S Ab: REACTIVE

## 2016-06-08 LAB — HEPATITIS B SURFACE ANTIGEN: Hepatitis B Surface Ag: NEGATIVE

## 2016-06-08 LAB — HIV ANTIBODY (ROUTINE TESTING W REFLEX): HIV SCREEN 4TH GENERATION: NONREACTIVE

## 2016-06-08 LAB — HEPATITIS B CORE ANTIBODY, TOTAL: Hep B Core Total Ab: POSITIVE — AB

## 2016-06-08 MED ORDER — ALUM & MAG HYDROXIDE-SIMETH 200-200-20 MG/5ML PO SUSP
30.0000 mL | Freq: Once | ORAL | Status: AC
  Administered 2016-06-08: 30 mL via ORAL
  Filled 2016-06-08: qty 30

## 2016-06-08 NOTE — Discharge Summary (Signed)
[name], is a 60 y.o. male  DOB Jun 29, 1955  MRN [id number].  Admission date:  06/06/2016  Admitting Physician  Wellington Hampshire, MD  Discharge Date:  06/08/2016   Primary MD  [place]  Recommendations for primary care physician for things to follow:   Follow-up with PCP in one week Follow up  With DR.Christopher END  In one week  Admission Diagnosis  ESRD (end stage renal disease) on dialysis ([place]) [N18.6, Z99.2] Acute respiratory failure with hypoxia ([place]) [J96.01]   Discharge Diagnosis  ESRD (end stage renal disease) on dialysis ([place]) [N18.6, Z99.2] Acute respiratory failure with hypoxia ([place]) [J96.01]    Active Problems:   Acute respiratory failure with hypoxia (HCC)   Elevated troponin      Past Medical History:  Diagnosis Date  . Chest pain    a. 09/2013 Myoview [facility]): basal inf defect more pronounced @ rest, likely artifact, EF 48%, low risk study.  . Chronic combined systolic and diastolic CHF (congestive heart failure) ([place])    a. 10/2013 Echo [facility]): EF 50%, diast dysfxn, triv AI/TR, mild MR;  b. 10/2014 Echo: EF 30-35%, diff HK, gr1 DD, mild AI, mild to mod MR/TR, mod dil LA, nl RV, mod to sev PAH;  c. 04/2016 Echo: EF 20-25%, sev dil LV, diff HK, gr3 DD, mod AI, MV syst bowing w/ prolapse, mod dil RV, mod TR.  [name] ESRD (end stage renal disease) ([place])    a. MWF dialysis @ [facility].  . Hypertensive heart disease with CHF (congestive heart failure) ([place])   . Peripheral vascular disease ([place])   . Pulmonary hypertension ([place]) 04/2016    Past Surgical History:  Procedure Laterality Date  . A/V SHUNT INTERVENTION N/A 04/06/2016   Procedure: A/V Shunt Intervention;  Surgeon: Katha Cabal, MD;  Location: [place] CV LAB;  Service: Cardiovascular;  Laterality: N/A;  . A/V SHUNTOGRAM  Left 04/06/2016   Procedure: A/V Fistulagram;  Surgeon: Katha Cabal, MD;  Location: [place] CV LAB;  Service: Cardiovascular;  Laterality: Left;  . AV FISTULA PLACEMENT Left 2014  . PERIPHERAL VASCULAR CATHETERIZATION N/A 10/03/2014   Procedure: A/V Shuntogram/Fistulagram;  Surgeon: Algernon Huxley, MD;  Location: [place] CV LAB;  Service: Cardiovascular;  Laterality: N/A;  . PERIPHERAL VASCULAR CATHETERIZATION Left 10/03/2014   Procedure: A/V Shunt Intervention;  Surgeon: Algernon Huxley, MD;  Location: [place] CV LAB;  Service: Cardiovascular;  Laterality: Left;  . PERIPHERAL VASCULAR CATHETERIZATION Left 05/01/2015   Procedure: A/V Shuntogram/Fistulagram;  Surgeon: Algernon Huxley, MD;  Location: [place] CV LAB;  Service: Cardiovascular;  Laterality: Left;  . PERIPHERAL VASCULAR CATHETERIZATION N/A 05/01/2015   Procedure: A/V Shunt Intervention;  Surgeon: Algernon Huxley, MD;  Location: [place] CV LAB;  Service: Cardiovascular;  Laterality: N/A;       History of present illness and  Hospital Course:     Kindly see H&P for history of present illness and admission details, please review complete Labs, Consult reports and Test reports for all details in brief  HPI  from the history and physical done on the day of admission 1-year-old male patient with history of hypertensive heart disease, PVD, ESRD on hemodialysis, history of heart failure with preserved ejection fraction from multiple recurrent admissions, admitted for possible last 6 months comes in with a progressive worsening of dyspnea, lower extremity edema, hypoxia with oxygen saturation 80% on room air, admitted to intensive care unit for BiPAP. Chest x-ray  showed pulmonary vascular congestion.     Hospital Course   #1. Acute respiratory failure due to combination of systolic and diastolic heart failure and cardiomyopathy: Patient previously had normal arm ejection fraction but worsening the left ventricular function  since 2015 with recent echocardiogram showing ejection fraction of 20-25% in March 2018. Patient received 1 dose of IV Lasix, patient also received hemodialysis yesterday. Did not require BiPAP anymore, weaned off the BiPAP and transferred to floor because his respiratory failure improved.  #2 ESRD on hemodialysis Monday, Wednesday, Friday, patient last dialysis  Before  came was Friday,dialysis was cut short by 30 min,received HD  Yesterday. It is better shortness of breath improved.  3 acute on chronic combined systolic and diastolic heart failure, patient of shortness of breath, dyspnea improved after removal of one half liters of fluid yesterday but symptoms not completely removed. She still has some chest discomfort and shortness of breath, patient declined ischemic evaluation and does not want any cardiac catheterization given the explained to him about his worsening heart failure and recurrent admissions.  #4 /acute on chronic systolic heart failure: EF 20-20% with diffuse hypokinesia. Patient refused cardiac catheterization, high risk for cardiac arrest continue aspirin, beta blockers, statins, nitrates and discharge. Have added hydralazine 50.mg TID.  5..malignant hypertension with blood pressure of 156/101.  bp better today, 6 hyperkalemia:treated  D  ischarge Condition:stable   Follow UP  Follow-up Information    [place] [facility] Follow up in 1 week(s).   Specialty:  General Practice Contact information: [place] [address] Bier, MD Follow up in 1 week(s).   Specialty:  Cardiology Contact information: [place] [address] [phone]             Discharge Instructions  and  Discharge Medications      Allergies as of 06/08/2016      Reactions   Sulfa Antibiotics Rash      Medication List    TAKE these medications   albuterol 108 (90 Base) MCG/ACT  inhaler Commonly known as:  PROVENTIL HFA;VENTOLIN HFA Inhale into the lungs every 4 (four) hours as needed for wheezing or shortness of breath.   aspirin EC 81 MG tablet Take 1 tablet (81 mg total) by mouth daily.   calcium acetate 667 MG capsule Commonly known as:  PHOSLO Take 1,334-2,001 mg by mouth 3 (three) times daily with meals. Take 2001 mgs with meals 3 times daily and 1334 with snacks   carvedilol 6.25 MG tablet Commonly known as:  COREG Take 1 tablet (6.25 mg total) by mouth 2 (two) times daily with a meal.   cinacalcet 90 MG tablet Commonly known as:  SENSIPAR Take 90 mg by mouth daily.   hydrALAZINE 50 MG tablet Commonly known as:  APRESOLINE Take 1 tablet (50 mg total) by mouth 3 (three) times daily.   ipratropium 17 MCG/ACT inhaler Commonly known as:  ATROVENT HFA Inhale 2 puffs into the lungs every 4 (four) hours as needed for wheezing.   isosorbide mononitrate 30 MG 24 hr tablet Commonly known as:  IMDUR Take 1 tablet (30 mg total) by mouth daily.   lidocaine-prilocaine cream Commonly known as:  EMLA Apply 1 application topically as needed (dialysis access).   TUMS ULTRA 1000 PO Take 2 tablets by mouth 3 (three) times daily before meals.         Diet and Activity recommendation:  See Discharge Instructions above   Consults obtained - cardio,nephro   Major procedures and Radiology Reports - PLEASE review detailed and final reports for all details, in brief -     Dg Chest 2 View  Result Date: 06/06/2016 CLINICAL DATA:  Acute dyspnea, edema EXAM: CHEST  2 VIEW COMPARISON:  04/29/2016 chest radiograph. FINDINGS: Right internal jugular central venous catheter terminates at the cavoatrial junction. Stable cardiomediastinal silhouette with cardiomegaly and aortic atherosclerosis. No pneumothorax. No pleural effusion. Mild pulmonary edema. No acute consolidative airspace disease. IMPRESSION: Mild congestive heart failure. Aortic atherosclerosis.  Electronically Signed   By: Ilona Sorrel M.D.   On: 06/06/2016 22:48    Micro Results    Recent Results (from the past 240 hour(s))  MRSA PCR Screening     Status: None   Collection Time: 06/07/16  1:00 AM  Result Value Ref Range Status   MRSA by PCR NEGATIVE NEGATIVE Final    Comment:        The GeneXpert MRSA Assay (FDA approved for NASAL specimens only), is one component of a comprehensive MRSA colonization surveillance program. It is not intended to diagnose MRSA infection nor to guide or monitor treatment for MRSA infections.        Today   Subjective:   [name] today has no headache,no chest abdominal pain,no new weakness tingling or numbness, feels much better wants to go home today.   Objective:   Blood pressure (!) 155/87, pulse 71, temperature 97.3 F (36.3 C), temperature source Oral, resp. rate 18, height 5\' 5"  (1.651 m), weight 75.4 kg (166 lb 4.8 oz), SpO2 98 %.   Intake/Output Summary (Last 24 hours) at 06/08/16 1028 Last data filed at 06/08/16 0730  Gross per 24 hour  Intake              360 ml  Output             2525 ml  Net            -2165 ml    Exam Awake Alert, Oriented x 3, No new F.N deficits, Normal affect [name],[name] Supple Neck,No JVD, No cervical lymphadenopathy appriciated.  Symmetrical Chest wall movement, Good air movement bilaterally, CTAB RRR,No Gallops,Rubs or new Murmurs, No Parasternal Heave +ve B.Sounds, Abd Soft, Non tender, No organomegaly appriciated, No rebound -guarding or rigidity. No Cyanosis, Clubbing or edema, No new Rash or bruise  Data Review   CBC w Diff: Lab Results  Component Value Date   WBC 5.3 06/07/2016   HGB 11.4 (L) 06/07/2016   HGB 9.8 (L) 09/04/2013   HCT 34.1 (L) 06/07/2016   HCT 31.2 (L) 09/04/2013   PLT 187 06/07/2016   PLT 203 09/04/2013   LYMPHOPCT 11 06/06/2016   LYMPHOPCT 16.0 04/28/2013   MONOPCT 11 06/06/2016   MONOPCT 9.0 04/28/2013   EOSPCT 1 06/06/2016   EOSPCT 2.8  04/28/2013   BASOPCT 1 06/06/2016   BASOPCT 0.5 04/28/2013    CMP: Lab Results  Component Value Date   NA 139 06/08/2016   NA 139 04/30/2013   K 4.3 06/08/2016   K 4.4 04/30/2013   CL 102 06/08/2016   CL 107 04/30/2013   CO2 27 06/08/2016   CO2 25 04/30/2013   BUN 43 (H) 06/08/2016   BUN 76 (H) 04/30/2013   CREATININE 6.81 (H) 06/08/2016   CREATININE 9.49 (H) 04/30/2013   PROT 7.7 04/29/2016   PROT 7.2 04/26/2013   ALBUMIN 3.4 (L) 06/07/2016  ALBUMIN 2.6 (L) 04/30/2013   BILITOT 0.9 04/29/2016   BILITOT 0.4 04/26/2013   ALKPHOS 76 04/29/2016   ALKPHOS 68 04/26/2013   AST 50 (H) 04/29/2016   AST 56 (H) 04/26/2013   ALT 38 04/29/2016   ALT 50 04/26/2013  .   Total Time in preparing paper work, data evaluation and todays exam - 22 minutes  Brihanna Devenport M.D on 06/08/2016 at 10:28 AM    Note: This dictation was prepared with Dragon dictation along with smaller phrase technology. Any transcriptional errors that result from this process are unintentional.

## 2016-06-08 NOTE — Progress Notes (Signed)
Progress Note  Patient Name: [name] Date of Encounter: 06/08/2016  Primary Cardiologist: Donnita Farina  Subjective   Breathing improved. Still with mild shortness of breath and vague left chest heaviness. Orthopnea resolved. Tolerated HD well yesterday.  Inpatient Medications    Scheduled Meds: . aspirin EC  81 mg Oral Daily  . atorvastatin  40 mg Oral q1800  . calcium acetate  2,001 mg Oral TID WC  . calcium carbonate  1,000 mg Oral TID AC  . carvedilol  6.25 mg Oral BID WC  . cinacalcet  90 mg Oral Q lunch  . heparin  5,000 Units Subcutaneous Q8H  . hydrALAZINE  50 mg Oral TID  . isosorbide mononitrate  30 mg Oral Daily  . sodium chloride flush  3 mL Intravenous Q12H   Continuous Infusions:  PRN Meds: sodium chloride, acetaminophen, calcium acetate **AND** calcium acetate, lidocaine-prilocaine, ondansetron (ZOFRAN) IV, sodium chloride flush   Vital Signs    Vitals:   06/07/16 1957 06/07/16 2020 06/08/16 0500 06/08/16 0550  BP: (!) 155/85 (!) 155/72  (!) 155/87  Pulse: 74 72  71  Resp: (!) 24 18  18   Temp: 98.2 F (36.8 C) 98.3 F (36.8 C)  97.3 F (36.3 C)  TempSrc: Oral Oral  Oral  SpO2: 94% 93%  98%  Weight: 165 lb 2 oz (74.9 kg)  166 lb 4.8 oz (75.4 kg)   Height:        Intake/Output Summary (Last 24 hours) at 06/08/16 0748 Last data filed at 06/08/16 0730  Gross per 24 hour  Intake              360 ml  Output             2525 ml  Net            -2165 ml   Filed Weights   06/07/16 1610 06/07/16 1957 06/08/16 0500  Weight: 170 lb 13.7 oz (77.5 kg) 165 lb 2 oz (74.9 kg) 166 lb 4.8 oz (75.4 kg)    Telemetry    NSR with occasional PACs and PVCs - Personally Reviewed  ECG    NSR with LVH and abnormal repolarization. Mild QT prolongation - Personally Reviewed  Physical Exam   GEN: No acute distress.   Neck: JVP to angle of mandible with positive HJR Cardiac: RRR, with 2/6 systolic murmur at LUSB. S3 noted. No rubs.  Respiratory: Clear to  auscultation bilaterally. GI: Soft, nontender, non-distended  MS: No edema; No deformity. LUE fistula in place. Right chest tunneled catheter also present. Neuro:  Nonfocal  Psych: Normal affect   Labs    Chemistry Recent Labs Lab 06/07/16 0113 06/07/16 0800 06/08/16 0440  NA 139 142 139  K 5.5* 5.5* 4.3  CL 104 103 102  CO2 17* 21* 27  GLUCOSE 144* 146* 110*  BUN 79* 80* 43*  CREATININE 10.03* 10.59* 6.81*  CALCIUM 9.0 8.9 8.7*  ALBUMIN  --  3.4*  --   GFRNONAA 5* 5* 8*  GFRAA 6* 5* 9*  ANIONGAP 18* 18* 10     Hematology Recent Labs Lab 06/06/16 2125 06/07/16 0800  WBC 6.0 5.3  RBC 3.77* 3.68*  HGB 11.4* 11.4*  HCT 35.0* 34.1*  MCV 92.9 92.7  MCH 30.3 31.0  MCHC 32.7 33.4  RDW 17.8* 17.7*  PLT 189 187    Cardiac Enzymes Recent Labs Lab 06/07/16 0113 06/07/16 0800 06/07/16 1252  TROPONINI 0.20* 0.17* 0.16*   No results for  input(s): TROPIPOC in the last 168 hours.   BNP Recent Labs Lab 06/06/16 2125  BNP >4,500.0*     DDimer No results for input(s): DDIMER in the last 168 hours.   Radiology    Dg Chest 2 View  Result Date: 06/06/2016 CLINICAL DATA:  Acute dyspnea, edema EXAM: CHEST  2 VIEW COMPARISON:  04/29/2016 chest radiograph. FINDINGS: Right internal jugular central venous catheter terminates at the cavoatrial junction. Stable cardiomediastinal silhouette with cardiomegaly and aortic atherosclerosis. No pneumothorax. No pleural effusion. Mild pulmonary edema. No acute consolidative airspace disease. IMPRESSION: Mild congestive heart failure. Aortic atherosclerosis. Electronically Signed   By: Ilona Sorrel M.D.   On: 06/06/2016 22:48    Cardiac Studies   TTE (04/30/16): Severely dilated left ventricle with LVEF of 20-25%. There is diffuse hypokinesis and grade 3 diastolic dysfunction. Moderate aortic regurgitation is present. Right ventricle appears moderately dilated. There is moderate tricuspid regurgitation.  Patient Profile     61 y.o.  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  Assessment & Plan    Acute on chronic combined systolic and diastolic heart failure Dyspnea and chest pain improved but not entirely resolved. 2.4 L of fluid removed with hemodialysis yesterday. Patient still has some evidence of volume overload and would likely benefit from additional ultrafiltration today. Etiology of cardiomyopathy remains uncertain; patient has declined ischemia evaluation at recent clinic visit and also does not wish to proceed with [facility] today. We discussed this procedure at length, including the risks, benefits, and diagnostic utility. He wished to think about it more today and readdress cath tomorrow.  Consider HD again today to optimize volume status.  Make NPO after midnight for possible left and right heart catheterization tomorrow, if patient is agreeable to proceeding.  Continue carvedilol 6.25 mg BID, hydralazine 50 mg TID, and isosorbide mononitrate 30 mg daily.  Would like to consider addition of ACEI or ARB, if nephrology is in agreement with this, given that patient has ESRD on HD. Of note, it appears that he was on irbesartan at one point as an outpatient.  Elevated troponin Most likely supply-demand mismatch in the setting of severe cardiomyopathy with acute heart failure exacerbation, uncontrolled hypertension, and ESRD. TnI has remained flat.  Continue ASA and statin.  Treat heart failure, as above.  L/RHC recommended; patient wishes to think about this more before proceeding. Will make NPO after midnight for possible cath tomorrow.  Hypertension BP suboptimally controlled.  Continue current medications.  If nephrology is agreeable, would recommend addition of ACEI or ARB. Alternatively, hydralazine and isosorbide  mononitrate could be uptitrated to improve blood pressure control.  Signed, Nelva Bush, MD  06/08/2016, 8:03 AM

## 2016-06-08 NOTE — Progress Notes (Signed)
06/08/2016  11:45 AM  [name] to be D/C'd Home per MD order.  Discussed prescriptions and follow up appointments with the patient. Prescriptions given to patient, medication list explained in detail. Pt verbalized understanding.  Allergies as of 06/08/2016      Reactions   Sulfa Antibiotics Rash      Medication List    TAKE these medications   albuterol 108 (90 Base) MCG/ACT inhaler Commonly known as:  PROVENTIL HFA;VENTOLIN HFA Inhale into the lungs every 4 (four) hours as needed for wheezing or shortness of breath.   aspirin EC 81 MG tablet Take 1 tablet (81 mg total) by mouth daily. Notes to patient:  Lasta dministered 06/08/16 AM   calcium acetate 667 MG capsule Commonly known as:  PHOSLO Take 1,334-2,001 mg by mouth 3 (three) times daily with meals. Take 2001 mgs with meals 3 times daily and 1334 with snacks Notes to patient:  Last administered 06/08/16 breakfast   carvedilol 6.25 MG tablet Commonly known as:  COREG Take 1 tablet (6.25 mg total) by mouth 2 (two) times daily with a meal. Notes to patient:  06/08/16 breakfast   cinacalcet 90 MG tablet Commonly known as:  SENSIPAR Take 90 mg by mouth daily. Notes to patient:  Last administered 06/08/16 morning   hydrALAZINE 50 MG tablet Commonly known as:  APRESOLINE Take 1 tablet (50 mg total) by mouth 3 (three) times daily. Notes to patient:  Last administered 06/08/16 morning   ipratropium 17 MCG/ACT inhaler Commonly known as:  ATROVENT HFA Inhale 2 puffs into the lungs every 4 (four) hours as needed for wheezing.   isosorbide mononitrate 30 MG 24 hr tablet Commonly known as:  IMDUR Take 1 tablet (30 mg total) by mouth daily. Notes to patient:  Last administered 06/08/16 morning   lidocaine-prilocaine cream Commonly known as:  EMLA Apply 1 application topically as needed (dialysis access).   TUMS ULTRA 1000 PO Take 2 tablets by mouth 3 (three) times daily before meals. Notes to patient:  Last administered  breakfast 06/08/16       Vitals:   06/07/16 2020 06/08/16 0550  BP: (!) 155/72 (!) 155/87  Pulse: 72 71  Resp: 18 18  Temp: 98.3 F (36.8 C) 97.3 F (36.3 C)    Skin clean, dry and intact without evidence of skin break down, no evidence of skin tears noted. IV catheter discontinued intact. Site without signs and symptoms of complications. Dressing and pressure applied. Pt denies pain at this time. No complaints noted.  An After Visit Summary was printed and given to the patient. Patient escorted via [place], and D/C home via private auto.  Dola Argyle

## 2016-06-08 NOTE — Care Management (Addendum)
Patient denies RNCM needs. Estill Bamberg with [employer] notified. Case closed.

## 2016-06-08 NOTE — Progress Notes (Signed)
Subjective:  Patient tolerated dialysis well yesterday 2400 cc of fluid was removed with the dialysis. He feels better today. Currently on room air - states that he is scared to get a heart catheterization.  He was to think about it and defer treatment for now - When asked about urine toxicity screen positive for cocaine, patient reports that his brother might have given him a laced cigarettes.  He denies ongoing use.  Objective:  Vital signs in last 24 hours:  Temp:  [97.3 F (36.3 C)-98.3 F (36.8 C)] 97.3 F (36.3 C) (04/17 0550) Pulse Rate:  [69-74] 71 (04/17 0550) Resp:  [17-24] 18 (04/17 0550) BP: (155)/(72-87) 155/87 (04/17 0550) SpO2:  [93 %-98 %] 98 % (04/17 0550) Weight:  [74.9 kg (165 lb 2 oz)-75.4 kg (166 lb 4.8 oz)] 75.4 kg (166 lb 4.8 oz) (04/17 0500)  Weight change: 1.795 kg (3 lb 15.3 oz) Filed Weights   06/07/16 1610 06/07/16 1957 06/08/16 0500  Weight: 77.5 kg (170 lb 13.7 oz) 74.9 kg (165 lb 2 oz) 75.4 kg (166 lb 4.8 oz)    Intake/Output:    Intake/Output Summary (Last 24 hours) at 06/08/16 1936 Last data filed at 06/08/16 0900  Gross per 24 hour  Intake              480 ml  Output             2400 ml  Net            -1920 ml     Physical Exam: General: No acute distress sitting in the bed  HEENT Anicteric, moist oral mucous membranes  Neck supple  Pulm/lungs Normal breathing effort, clear to auscultation  CVS/Heart irregular, no rub or gallop  Abdomen:  Soft, nontender, nondistended  Extremities: No peripheral edema  Neurologic: Alert, oriented  Skin: vitiligo  Access: PermCath       Basic Metabolic Panel:   Recent Labs Lab 06/06/16 2125 06/07/16 0113 06/07/16 0800 06/08/16 0440  NA 139 139 142 139  K 5.2* 5.5* 5.5* 4.3  CL 104 104 103 102  CO2 19* 17* 21* 27  GLUCOSE 117* 144* 146* 110*  BUN 78* 79* 80* 43*  CREATININE 10.02* 10.03* 10.59* 6.81*  CALCIUM 8.8* 9.0 8.9 8.7*  MG 1.9 2.0  --   --   PHOS  --   --  9.9*  --       CBC:  Recent Labs Lab 06/06/16 2125 06/07/16 0800  WBC 6.0 5.3  NEUTROABS 4.5  --   HGB 11.4* 11.4*  HCT 35.0* 34.1*  MCV 92.9 92.7  PLT 189 187      Microbiology:  Recent Results (from the past 720 hour(s))  MRSA PCR Screening     Status: None   Collection Time: 06/07/16  1:00 AM  Result Value Ref Range Status   MRSA by PCR NEGATIVE NEGATIVE Final    Comment:        The GeneXpert MRSA Assay (FDA approved for NASAL specimens only), is one component of a comprehensive MRSA colonization surveillance program. It is not intended to diagnose MRSA infection nor to guide or monitor treatment for MRSA infections.     Coagulation Studies: No results for input(s): LABPROT, INR in the last 72 hours.  Urinalysis: No results for input(s): COLORURINE, LABSPEC, PHURINE, GLUCOSEU, HGBUR, BILIRUBINUR, KETONESUR, PROTEINUR, UROBILINOGEN, NITRITE, LEUKOCYTESUR in the last 72 hours.  Invalid input(s): APPERANCEUR    Imaging: Dg Chest 2 View  Result Date:  06/06/2016 CLINICAL DATA:  Acute dyspnea, edema EXAM: CHEST  2 VIEW COMPARISON:  04/29/2016 chest radiograph. FINDINGS: Right internal jugular central venous catheter terminates at the cavoatrial junction. Stable cardiomediastinal silhouette with cardiomegaly and aortic atherosclerosis. No pneumothorax. No pleural effusion. Mild pulmonary edema. No acute consolidative airspace disease. IMPRESSION: Mild congestive heart failure. Aortic atherosclerosis. Electronically Signed   By: Ilona Sorrel M.D.   On: 06/06/2016 22:48     Medications:       Assessment/ Plan:  61 y.o.AA male with hypertension, hepatitis C, ESRD started HD 10/13, anemia of CKD, admission for diarrhea/hyperkalemia 10/2012, admission for GI bleed 10/2012, SHPTH, GI bleed 3/15, seizure episode,    1.  End-stage renal disease [facility], [place], [place]; MWF; [facility] Resume normal dialysis schedule Outpatient EDW 73.5 kg - 2.4 L of morbid  dialysis yesterday.  Today's weight is 75 kg  - Clinically much improved.  Currently on room air.  Wants to be discharged  2. AOCKD Hgb 11.4 Monitor  3. SHPTH  Low phos/renal diet Continue home dose of binders  4. Chronic SOB Multifactorial from Chronic systolic CHF EF 37-35 %, fluid overload and Cocaine abuse  improved with fluid removal    LOS: 2 [name] 4/17/20187:36 PM

## 2016-06-10 NOTE — Progress Notes (Signed)
Subjective:    Patient ID: [name], male    DOB: 11-22-1955, 61 y.o.   MRN: [id number] Chief Complaint  Patient presents with  . Follow-up   Patient presents to review vascular studies. He is right hand dominant. There is a right permcath which he is being maintained by. Bruit and thrill in left upper extremity access. Vein mapping with monophasic left brachial / radial artery, biphasic ulnar. Possible left brachial cephalic.    Review of Systems  Constitutional: Negative.   HENT: Negative.   Eyes: Negative.   Respiratory: Negative.   Cardiovascular: Negative.   Gastrointestinal: Negative.   Endocrine: Negative.   Genitourinary:       ESRD  Musculoskeletal: Negative.   Skin: Negative.   Allergic/Immunologic: Negative.   Neurological: Negative.   Hematological: Negative.   Psychiatric/Behavioral: Negative.       Objective:   Physical Exam  Constitutional: He is oriented to person, place, and time. He appears well-developed and well-nourished. No distress.  HENT:  Head: Normocephalic and atraumatic.  Eyes: Conjunctivae are normal. Pupils are equal, round, and reactive to light.  Neck: Normal range of motion.  Cardiovascular: Normal rate, regular rhythm, normal heart sounds and intact distal pulses.   Pulses:      Radial pulses are 2+ on the right side, and 1+ on the left side.  Bruit and Thrill in left upper access  Pulmonary/Chest: Effort normal.  Musculoskeletal: Normal range of motion. He exhibits no edema.  Neurological: He is alert and oriented to person, place, and time.  Skin: Skin is warm and dry. He is not diaphoretic.  Psychiatric: He has a normal mood and affect. His behavior is normal. Judgment and thought content normal.  Vitals reviewed.  BP (!) 162/96   Pulse 69   Resp 17   Wt 168 lb (76.2 kg)   BMI 26.31 kg/m   Past Medical History:  Diagnosis Date  . Chest pain    a. 09/2013 Myoview [facility]): basal inf defect more pronounced @ rest, likely  artifact, EF 48%, low risk study.  . Chronic combined systolic and diastolic CHF (congestive heart failure) ([place])    a. 10/2013 Echo [facility]): EF 50%, diast dysfxn, triv AI/TR, mild MR;  b. 10/2014 Echo: EF 30-35%, diff HK, gr1 DD, mild AI, mild to mod MR/TR, mod dil LA, nl RV, mod to sev PAH;  c. 04/2016 Echo: EF 20-25%, sev dil LV, diff HK, gr3 DD, mod AI, MV syst bowing w/ prolapse, mod dil RV, mod TR.  [name] ESRD (end stage renal disease) ([place])    a. MWF dialysis @ [facility].  . Hypertensive heart disease with CHF (congestive heart failure) ([place])   . Peripheral vascular disease ([place])   . Pulmonary hypertension ([place]) 04/2016   Social History   Social History  . Marital status: Widowed    Spouse name: N/A  . Number of children: N/A  . Years of education: N/A   Occupational History  . Not on file.   Social History Main Topics  . Smoking status: Current Some Day Smoker    Packs/day: 0.25    Years: 40.00    Types: Cigarettes  . Smokeless tobacco: Never Used  . Alcohol use No  . Drug use: Yes    Types: Cocaine  . Sexual activity: Not on file   Other Topics Concern  . Not on file   Social History Narrative   Lives at home in [place] by himself.   Independent on  ambulation but does not routinely exercise.   Past Surgical History:  Procedure Laterality Date  . A/V SHUNT INTERVENTION N/A 04/06/2016   Procedure: A/V Shunt Intervention;  Surgeon: Katha Cabal, MD;  Location: [place] [facility];  Service: Cardiovascular;  Laterality: N/A;  . A/V SHUNTOGRAM Left 04/06/2016   Procedure: A/V Fistulagram;  Surgeon: Katha Cabal, MD;  Location: [place] [facility];  Service: Cardiovascular;  Laterality: Left;  . AV FISTULA PLACEMENT Left 2014  . PERIPHERAL VASCULAR CATHETERIZATION N/A 10/03/2014   Procedure: A/V Shuntogram/Fistulagram;  Surgeon: Algernon Huxley, MD;  Location: [place] [facility];  Service: Cardiovascular;  Laterality: N/A;  . PERIPHERAL VASCULAR  CATHETERIZATION Left 10/03/2014   Procedure: A/V Shunt Intervention;  Surgeon: Algernon Huxley, MD;  Location: [place] [facility];  Service: Cardiovascular;  Laterality: Left;  . PERIPHERAL VASCULAR CATHETERIZATION Left 05/01/2015   Procedure: A/V Shuntogram/Fistulagram;  Surgeon: Algernon Huxley, MD;  Location: [facility];  Service: Cardiovascular;  Laterality: Left;  . PERIPHERAL VASCULAR CATHETERIZATION N/A 05/01/2015   Procedure: A/V Shunt Intervention;  Surgeon: Algernon Huxley, MD;  Location: [place] [facility];  Service: Cardiovascular;  Laterality: N/A;   Family History  Problem Relation Age of Onset  . Hypertension Son   . Diabetes Son   . Hypertension Mother   . Diabetes Mother   . Diabetes Sister    Allergies  Allergen Reactions  . Sulfa Antibiotics Rash      Assessment & Plan:  Patient presents to review vascular studies. He is right hand dominant. There is a right permcath which he is being maintained by. Bruit and thrill in left upper extremity access. Vein mapping with monophasic left brachial / radial artery, biphasic ulnar. Possible left brachial cephalic.   1. ESRD (end stage renal disease) on dialysis ([place]) - Stable Patient needs new access. Being maintained by right permcath for now. Vein mapping with possible left brachio-basilic however with monophasic brachial / radial artery. Left upper extremity angiogram to assess arterial anatomy in preparation for new dialysis access creation. Procedure, risks and benefits explained to patient. All questions answered. Patient wishes to proceed.   2. Tobacco use - Stable I have discussed (approximately 5 minutes) with the patient the role of tobacco in the pathogenesis of atherosclerosis and its effect on the progression of the disease, impact on the durability of interventions and its limitations on the formation of collateral pathways. I have recommended absolute tobacco cessation. I have discussed various options available  for assistance with tobacco cessation including over the counter methods (Nicotine gum, patch and lozenges). We also discussed prescription options (Chantix, Nicotine Inhaler / Nasal Spray). The patient is not interested in pursuing any prescription tobacco cessation options at this time. The patient voices their understanding.   Current Outpatient Prescriptions on File Prior to Visit  Medication Sig Dispense Refill  . aspirin EC 81 MG tablet Take 1 tablet (81 mg total) by mouth daily. 90 tablet 3  . calcium acetate (PHOSLO) 667 MG capsule Take 1,334-2,001 mg by mouth 3 (three) times daily with meals. Take 2001 mgs with meals 3 times daily and 1334 with snacks    . Calcium Carbonate Antacid (TUMS ULTRA 1000 PO) Take 2 tablets by mouth 3 (three) times daily before meals.    . carvedilol (COREG) 6.25 MG tablet Take 1 tablet (6.25 mg total) by mouth 2 (two) times daily with a meal. 60 tablet 0  . cinacalcet (SENSIPAR) 90 MG tablet  Take 90 mg by mouth daily.    . hydrALAZINE (APRESOLINE) 50 MG tablet Take 1 tablet (50 mg total) by mouth 3 (three) times daily. 270 tablet 3  . isosorbide mononitrate (IMDUR) 30 MG 24 hr tablet Take 1 tablet (30 mg total) by mouth daily. 90 tablet 3  . lidocaine-prilocaine (EMLA) cream Apply 1 application topically as needed (dialysis access).     No current facility-administered medications on file prior to visit.     There are no Patient Instructions on file for this visit. No Follow-up on file.   KIMBERLY A STEGMAYER, PA-C

## 2016-06-11 ENCOUNTER — Encounter (INDEPENDENT_AMBULATORY_CARE_PROVIDER_SITE_OTHER): Payer: Self-pay

## 2016-06-15 ENCOUNTER — Ambulatory Visit (INDEPENDENT_AMBULATORY_CARE_PROVIDER_SITE_OTHER): Payer: Medicare Other | Admitting: Internal Medicine

## 2016-06-15 ENCOUNTER — Encounter: Payer: Self-pay | Admitting: Internal Medicine

## 2016-06-15 VITALS — BP 144/74 | HR 69 | Ht 66.0 in | Wt 168.5 lb

## 2016-06-15 DIAGNOSIS — I10 Essential (primary) hypertension: Secondary | ICD-10-CM | POA: Diagnosis not present

## 2016-06-15 DIAGNOSIS — I5042 Chronic combined systolic (congestive) and diastolic (congestive) heart failure: Secondary | ICD-10-CM | POA: Diagnosis not present

## 2016-06-15 NOTE — Patient Instructions (Signed)
Medication Instructions:  Your physician recommends that you continue on your current medications as directed. Please refer to the Current Medication list given to you today.   Labwork: NONE  Testing/Procedures: NONE  Follow-Up: Your physician wants you to follow-up in: [address] [facility]. You will receive a reminder letter in the mail two months in advance. If you don't receive a letter, please call our office to schedule the follow-up appointment.   If you need a refill on your cardiac medications before your next appointment, please call your pharmacy.

## 2016-06-15 NOTE — Progress Notes (Signed)
Follow-up Outpatient Visit Date: 06/15/2016  Primary Care Provider: [facility] [place] [address]  Chief Complaint: Follow-up heart failure  HPI:  [name] is a 61 y.o. year-old male with history of chronic systolic heart failure of uncertain etiology, [name]-stage renal disease, hypertension, and peripheral vascular disease, who presents for follow-up of heart failure. I last saw him in the office on 05/20/16, at which time we discussed cardiac catheterization. He declined. We added isosorbide mononitrate for improved blood pressure control. The patient was admitted last week due to acute heart failure exacerbation with volume overload. He underwent hemodialysis and was discharged the next day, as he again refused cardiac catheterization.  Today, [name] reports that his breathing is better. He is able to walk up to 1/4 mile without needing to stop to catch his breath. He denies chest pain, palpitations, lightheadedness, edema, and PND. He has stable 4-pillow orthopnea. He has remained compliant with his medications, as well as hemodialysis.  --------------------------------------------------------------------------------------------------  Cardiovascular History & Procedures: Cardiovascular Problems:  Chronic systolic heart failure  Risk Factors:  Hypertension, male gender, age greater than 71, and tobacco use  Cath/PCI:  None  CV Surgery:  None  EP Procedures and Devices:  None  Non-Invasive Evaluation(s):  Transthoracic echocardiogram (04/30/16): Severely dilated left ventricle with LVEF of 20-25%. There is diffuse hypokinesis and grade 3 diastolic dysfunction. Moderate aortic regurgitation is present. Right ventricle appears moderately dilated. There is moderate tricuspid regurgitation.  Transthoracic echocardiogram (11/11/14): Moderately dilated LV with LVEF of 30-35% and diffuse hypokinesis. Grade 1 diastolic  dysfunction. Mild aortic regurgitation. Mild to moderate eccentric MR. Moderately dilated left atrium. Normal RV size and function. Mild to moderate TR. Moderate to severe pulmonary hypertension.  Transthoracic echocardiogram (11/13/13, [facility]): Mild LVH with LVEF of 50%. Diastolic dysfunction with elevated filling pressure evident. Mitral valve demonstrates mild thickening with annular calcification and mild regurgitation. Left atrium is mildly enlarged. Trivial aortic regurgitation. Mildly dilated thoracic aorta. Trivial TR.  Pharmacologic myocardial perfusion stress test (10/09/13, [facility]): Low risk study with basal inferior defect more pronounced on the rest images likely due to artifact. LVEF mildly reduced at 48%. Coronary artery calcification and mitral annular calcification noted.  Recent CV Pertinent Labs: Lab Results  Component Value Date   CHOL 101 11/07/2012   HDL 39 (L) 11/07/2012   LDLCALC 38 11/07/2012   TRIG 119 11/07/2012   INR 1.1 04/26/2013   BNP >4,500.0 (H) 06/06/2016   K 4.3 06/08/2016   K 4.4 04/30/2013   MG 2.0 06/07/2016   MG 1.5 (L) 11/05/2012   BUN 43 (H) 06/08/2016   BUN 76 (H) 04/30/2013   CREATININE 6.81 (H) 06/08/2016   CREATININE 9.49 (H) 04/30/2013    Past medical and surgical history were reviewed and updated in EPIC.  Outpatient Encounter Prescriptions as of 06/15/2016  Medication Sig  . albuterol (PROVENTIL HFA;VENTOLIN HFA) 108 (90 Base) MCG/ACT inhaler Inhale into the lungs every 4 (four) hours as needed for wheezing or shortness of breath.  [name] aspirin EC 81 MG tablet Take 1 tablet (81 mg total) by mouth daily.  . calcium acetate (PHOSLO) 667 MG capsule Take 1,334-2,001 mg by mouth 3 (three) times daily with meals. Take 2001 mgs with meals 3 times daily and 1334 with snacks  . Calcium Carbonate Antacid (TUMS ULTRA 1000 PO) Take 2 tablets by mouth 3 (three) times daily before meals.  . carvedilol (COREG) 6.25 MG tablet Take 1 tablet (6.25 mg total)  by mouth  2 (two) times daily with a meal.  . cinacalcet (SENSIPAR) 90 MG tablet Take 90 mg by mouth daily.  . hydrALAZINE (APRESOLINE) 50 MG tablet Take 1 tablet (50 mg total) by mouth 3 (three) times daily.  [name] ipratropium (ATROVENT HFA) 17 MCG/ACT inhaler Inhale 2 puffs into the lungs every 4 (four) hours as needed for wheezing.  . isosorbide mononitrate (IMDUR) 30 MG 24 hr tablet Take 1 tablet (30 mg total) by mouth daily.  [name] lidocaine-prilocaine (EMLA) cream Apply 1 application topically as needed (dialysis access).   No facility-administered encounter medications on file as of 06/15/2016.     Allergies: Sulfa antibiotics  Social History   Social History  . Marital status: Widowed    Spouse name: N/A  . Number of children: N/A  . Years of education: N/A   Occupational History  . Not on file.   Social History Main Topics  . Smoking status: Current Some Day Smoker    Packs/day: 0.25    Years: 40.00    Types: Cigarettes  . Smokeless tobacco: Never Used  . Alcohol use No  . Drug use: Yes    Types: Cocaine  . Sexual activity: Not on file   Other Topics Concern  . Not on file   Social History Narrative   Lives at home in [place] by himself.   Independent on ambulation but does not routinely exercise.    Family History  Problem Relation Age of Onset  . Hypertension Son   . Diabetes Son   . Hypertension Mother   . Diabetes Mother   . Diabetes Sister     Review of Systems: A 12-system review of systems was performed and was negative except as noted in the HPI.  --------------------------------------------------------------------------------------------------  Physical Exam: BP (!) 144/74 (BP Location: Right Arm, Patient Position: Sitting, Cuff Size: Normal)   Pulse 69   Ht 5\' 6"  (1.676 m)   Wt 168 lb 8 oz (76.4 kg)   BMI 27.20 kg/m   General:  Overweight man, seated comfortably in the exam room. HEENT: No conjunctival pallor or scleral icterus.  Moist mucous  membranes.  OP clear. Neck: Supple without lymphadenopathy, thyromegaly, JVD, or HJR. Right IJ tunneled HD catheter remains in place. Lungs: Normal work of breathing.  Clear to auscultation bilaterally without wheezes or crackles. Heart: Regular rate and rhythm with 2/6 holosystolic murmur loudest at LLSB.  Non-displaced PMI. Abd: Bowel sounds present.  Soft, NT/ND without hepatosplenomegaly Ext: No lower extremity edema.  Radial, PT, and DP pulses are 2+ bilaterally. Left forearm fistula in place. No thrill appreciated. Skin: warm and dry without rash  EKG:  Normal sinus rhythm with LVH and inferolateral ST/T changes most likely representing abnormal repolarization, though ischemia cannot be excluded. Mild QT prolongation. No significant change from prior tracing on 06/06/16 (I have personally reviewed both tracings).  Lab Results  Component Value Date   WBC 5.3 06/07/2016   HGB 11.4 (L) 06/07/2016   HCT 34.1 (L) 06/07/2016   MCV 92.7 06/07/2016   PLT 187 06/07/2016    Lab Results  Component Value Date   NA 139 06/08/2016   K 4.3 06/08/2016   CL 102 06/08/2016   CO2 27 06/08/2016   BUN 43 (H) 06/08/2016   CREATININE 6.81 (H) 06/08/2016   GLUCOSE 110 (H) 06/08/2016   ALT 38 04/29/2016    Lab Results  Component Value Date   CHOL 101 11/07/2012   HDL 39 (L) 11/07/2012  [place] 38 11/07/2012   TRIG 119 11/07/2012    --------------------------------------------------------------------------------------------------  ASSESSMENT AND PLAN: Chronic systolic and diastolic heart failure Patient appears euvolemic with NYHA class II symptoms. We discussed importance of ischemia evaluation; I have again recommended left heart catheterization, which Mr. Paradiso refuses. He is concerned about the risks of the procedure and need for femoral access despite my reassurances that the overall risk for significant complications with diagnostic catheterization is low. Given that he has ESRD, I  would like to avoid radial access. He also is not interested in myocardial perfusion stress test at this time. I have spoken with Dr. Candiss Norse, who is ok with restarting ARB. We will add losartan 25 mg daily to his current regimen of carvedilol, hydralazine, and isosorbide mononitrate.  Hypertension Blood pressure suboptimally controlled, as above, we will add losartan. He will need repeat labs to ensure potassium is not trending up and HD regimen does not need to be altered.  Follow-up: Return to clinic in 6 weeks.  Nelva Bush, MD 06/16/2016 7:23 AM

## 2016-06-16 ENCOUNTER — Telehealth: Payer: Self-pay | Admitting: *Deleted

## 2016-06-16 DIAGNOSIS — I10 Essential (primary) hypertension: Secondary | ICD-10-CM

## 2016-06-16 DIAGNOSIS — Z992 Dependence on renal dialysis: Secondary | ICD-10-CM

## 2016-06-16 DIAGNOSIS — Z79899 Other long term (current) drug therapy: Secondary | ICD-10-CM

## 2016-06-16 DIAGNOSIS — N186 End stage renal disease: Secondary | ICD-10-CM

## 2016-06-16 NOTE — Telephone Encounter (Signed)
Attempted to call patient's number and the message said "The number you have reached has been disconnected or no longer in service."  No other numbers listed other than a son. Attempted son's number and no answer. Left message to call back on that number.

## 2016-06-16 NOTE — Telephone Encounter (Signed)
-----   Message from Nelva Bush, MD sent at 06/16/2016  7:25 AM EDT ----- Regarding: Medication change and labs Hi Anderson Malta,  Can you let Mr. Longoria know that I spoke with Dr. Candiss Norse yesterday and that we have agreed to restart an ARB? I suggest that we try losartan 25 mg daily. Mr. Diebel should have a basic metabolic panel done in about a week to make sure that his potassium is not significantly affected, given his kidney disease. This could be drawn either in our office or with dialysis. Please let me know if any questions come up. Thanks for help.  Gerald Stabs

## 2016-06-17 ENCOUNTER — Encounter (INDEPENDENT_AMBULATORY_CARE_PROVIDER_SITE_OTHER): Payer: Self-pay

## 2016-06-17 MED ORDER — LOSARTAN POTASSIUM 25 MG PO TABS: 25.0000 mg | ORAL_TABLET | Freq: Every day | ORAL | 3 refills | Status: DC

## 2016-06-17 NOTE — Telephone Encounter (Signed)
Spoke with patient. He verbalized understanding and Rx sent to [facility]. I will call [facility] on [address] to see if they can draw the lab work.

## 2016-06-17 NOTE — Telephone Encounter (Signed)
Spoke with [facility].  The nurse who is in charge of lab work for patient's is not in today to provide any information regarding lab work for this patient next week. Advised for me to call back next week to speak with her. If patient unable to have lab work during dialysis, I will notify patient to come to [facility] for labs.

## 2016-06-18 ENCOUNTER — Encounter (INDEPENDENT_AMBULATORY_CARE_PROVIDER_SITE_OTHER): Payer: Self-pay

## 2016-06-21 NOTE — Telephone Encounter (Signed)
No answer at [facility] Nurse's station after several rings. No way to leave a message. Will try again at another time.

## 2016-06-22 ENCOUNTER — Ambulatory Visit: Payer: Medicare Other | Admitting: Internal Medicine

## 2016-06-22 NOTE — Telephone Encounter (Signed)
Order for BMET faxed to [facility] at [phone].

## 2016-06-22 NOTE — Telephone Encounter (Signed)
Spoke with [name] at [facility] to see if he received faxed order for BMET. He did not receive.  Refaxing now.

## 2016-06-22 NOTE — Telephone Encounter (Signed)
Spoke with [employer] [employer] at [facility]. Patient is not due to have another BMET until the end of May. His weekly labs will be drawn on Monday, 06/28/16 but it does not include a BMET. We can fax an order for BMET to [phone] from Dr End. It will then need approval from the nephrologist.

## 2016-06-23 NOTE — Telephone Encounter (Signed)
Spoke with [employer] at [facility]. Faxed order for BMET was received and will be entered so that patient can have drawn on Monday, 06/28/16.

## 2016-06-28 ENCOUNTER — Other Ambulatory Visit (INDEPENDENT_AMBULATORY_CARE_PROVIDER_SITE_OTHER): Payer: Self-pay | Admitting: Vascular Surgery

## 2016-06-28 MED ORDER — SODIUM CHLORIDE 0.9 % IV SOLN: 125.0000 mg | INTRAVENOUS | Status: DC | PRN

## 2016-06-28 MED ORDER — FAMOTIDINE 10 MG PO TABS: 40.0000 mg | ORAL_TABLET | ORAL | Status: DC | PRN

## 2016-06-29 ENCOUNTER — Ambulatory Visit
Admission: RE | Admit: 2016-06-29 | Discharge: 2016-06-29 | Disposition: A | Discharge status: Home / Self Care | Payer: Medicare Other | Source: Ambulatory Visit | Attending: Vascular Surgery | Admitting: Vascular Surgery

## 2016-06-29 ENCOUNTER — Encounter
Admission: RE | Disposition: A | Discharge status: Home / Self Care | Payer: Self-pay | Source: Ambulatory Visit | Attending: Vascular Surgery

## 2016-06-29 DIAGNOSIS — E1122 Type 2 diabetes mellitus with diabetic chronic kidney disease: Secondary | ICD-10-CM | POA: Insufficient documentation

## 2016-06-29 DIAGNOSIS — N186 End stage renal disease: Secondary | ICD-10-CM | POA: Insufficient documentation

## 2016-06-29 DIAGNOSIS — Z9889 Other specified postprocedural states: Secondary | ICD-10-CM | POA: Insufficient documentation

## 2016-06-29 DIAGNOSIS — Z992 Dependence on renal dialysis: Secondary | ICD-10-CM | POA: Insufficient documentation

## 2016-06-29 DIAGNOSIS — I272 Pulmonary hypertension, unspecified: Secondary | ICD-10-CM | POA: Insufficient documentation

## 2016-06-29 DIAGNOSIS — I5042 Chronic combined systolic (congestive) and diastolic (congestive) heart failure: Secondary | ICD-10-CM | POA: Diagnosis not present

## 2016-06-29 DIAGNOSIS — I132 Hypertensive heart and chronic kidney disease with heart failure and with stage 5 chronic kidney disease, or end stage renal disease: Secondary | ICD-10-CM | POA: Diagnosis not present

## 2016-06-29 DIAGNOSIS — E119 Type 2 diabetes mellitus without complications: Secondary | ICD-10-CM | POA: Diagnosis not present

## 2016-06-29 DIAGNOSIS — Z882 Allergy status to sulfonamides status: Secondary | ICD-10-CM | POA: Diagnosis not present

## 2016-06-29 DIAGNOSIS — Z833 Family history of diabetes mellitus: Secondary | ICD-10-CM | POA: Diagnosis not present

## 2016-06-29 DIAGNOSIS — I10 Essential (primary) hypertension: Secondary | ICD-10-CM

## 2016-06-29 DIAGNOSIS — F17210 Nicotine dependence, cigarettes, uncomplicated: Secondary | ICD-10-CM | POA: Diagnosis not present

## 2016-06-29 DIAGNOSIS — I70298 Other atherosclerosis of native arteries of extremities, other extremity: Secondary | ICD-10-CM | POA: Insufficient documentation

## 2016-06-29 DIAGNOSIS — I739 Peripheral vascular disease, unspecified: Secondary | ICD-10-CM | POA: Diagnosis not present

## 2016-06-29 DIAGNOSIS — T82868A Thrombosis of vascular prosthetic devices, implants and grafts, initial encounter: Secondary | ICD-10-CM | POA: Diagnosis not present

## 2016-06-29 DIAGNOSIS — Z8249 Family history of ischemic heart disease and other diseases of the circulatory system: Secondary | ICD-10-CM | POA: Insufficient documentation

## 2016-06-29 HISTORY — PX: A/V SHUNT INTERVENTION: CATH118220

## 2016-06-29 HISTORY — PX: UPPER EXTREMITY ANGIOGRAPHY: CATH118270

## 2016-06-29 HISTORY — PX: UPPER EXTREMITY INTERVENTION: CATH118271

## 2016-06-29 LAB — POTASSIUM (ARMC VASCULAR LAB ONLY): POTASSIUM (ARMC VASCULAR LAB): 4.7 (ref 3.5–5.1)

## 2016-06-29 SURGERY — UPPER EXTREMITY ANGIOGRAPHY
Anesthesia: Moderate Sedation

## 2016-06-29 MED ORDER — HEPARIN (PORCINE) IN NACL 2-0.9 UNIT/ML-% IJ SOLN
INTRAMUSCULAR | Status: AC
Start: 2016-06-29 — End: 2016-06-29
  Filled 2016-06-29: qty 1000

## 2016-06-29 MED ORDER — SODIUM CHLORIDE 0.9 % IV SOLN
INTRAVENOUS | Status: DC
  Administered 2016-06-29: 08:00:00 via INTRAVENOUS

## 2016-06-29 MED ORDER — FENTANYL CITRATE (PF) 100 MCG/2ML IJ SOLN
INTRAMUSCULAR | Status: DC | PRN
  Administered 2016-06-29: 25 ug via INTRAVENOUS
  Administered 2016-06-29: 50 ug via INTRAVENOUS

## 2016-06-29 MED ORDER — HEPARIN SODIUM (PORCINE) 1000 UNIT/ML IJ SOLN
INTRAMUSCULAR | Status: DC | PRN
  Administered 2016-06-29: 3000 [IU] via INTRAVENOUS

## 2016-06-29 MED ORDER — ONDANSETRON HCL 4 MG/2ML IJ SOLN: 4.0000 mg | Freq: Four times a day (QID) | INTRAMUSCULAR | Status: DC | PRN

## 2016-06-29 MED ORDER — MIDAZOLAM HCL 2 MG/2ML IJ SOLN
INTRAMUSCULAR | Status: DC | PRN
  Administered 2016-06-29 (×2): 1 mg via INTRAVENOUS
  Administered 2016-06-29: 2 mg via INTRAVENOUS

## 2016-06-29 MED ORDER — OXYCODONE HCL 5 MG PO TABS
ORAL_TABLET | ORAL | Status: AC
  Administered 2016-06-29: 5 mg via ORAL
  Filled 2016-06-29: qty 1

## 2016-06-29 MED ORDER — DIPHENHYDRAMINE HCL 50 MG/ML IJ SOLN
INTRAMUSCULAR | Status: DC | PRN
  Administered 2016-06-29: 50 mg via INTRAVENOUS

## 2016-06-29 MED ORDER — LIDOCAINE HCL (PF) 1 % IJ SOLN
INTRAMUSCULAR | Status: AC
  Filled 2016-06-29: qty 30

## 2016-06-29 MED ORDER — IOPAMIDOL (ISOVUE-300) INJECTION 61%
INTRAVENOUS | Status: DC | PRN
  Administered 2016-06-29: 40 mL via INTRA_ARTERIAL

## 2016-06-29 MED ORDER — HYDROMORPHONE HCL 1 MG/ML IJ SOLN: 1.0000 mg | Freq: Once | INTRAMUSCULAR | Status: DC | PRN

## 2016-06-29 MED ORDER — HYDRALAZINE HCL 20 MG/ML IJ SOLN
10.0000 mg | Freq: Once | INTRAMUSCULAR | Status: AC
  Administered 2016-06-29: 10 mg via INTRAVENOUS

## 2016-06-29 MED ORDER — CEFAZOLIN SODIUM-DEXTROSE 1-4 GM/50ML-% IV SOLN
1.0000 g | Freq: Once | INTRAVENOUS | Status: AC
  Administered 2016-06-29: 1 g via INTRAVENOUS

## 2016-06-29 MED ORDER — HYDRALAZINE HCL 20 MG/ML IJ SOLN
INTRAMUSCULAR | Status: AC
  Filled 2016-06-29: qty 1

## 2016-06-29 MED ORDER — OXYCODONE HCL 5 MG PO TABS
5.0000 mg | ORAL_TABLET | Freq: Once | ORAL | Status: AC
  Administered 2016-06-29: 5 mg via ORAL

## 2016-06-29 SURGICAL SUPPLY — 10 items
CATH ANGIO 5F 100CM .035 PIG (CATHETERS) ×5 IMPLANT
CATH H1 100CM (CATHETERS) ×5 IMPLANT
DEVICE STARCLOSE SE CLOSURE (Vascular Products) ×5 IMPLANT
GLIDEWIRE ANGLED SS 035X260CM (WIRE) ×5 IMPLANT
NEEDLE ENTRY 21GA 7CM ECHOTIP (NEEDLE) ×5 IMPLANT
PACK ANGIOGRAPHY (CUSTOM PROCEDURE TRAY) ×5 IMPLANT
SET INTRO CAPELLA COAXIAL (SET/KITS/TRAYS/PACK) ×5 IMPLANT
SHEATH BRITE TIP 5FRX11 (SHEATH) ×5 IMPLANT
SHIELD X-DRAPE GOLD 12X17 (MISCELLANEOUS) ×5 IMPLANT
WIRE J 3MM .035X145CM (WIRE) ×5 IMPLANT

## 2016-06-29 NOTE — Op Note (Signed)
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  Arch aortogram  2.  Left upper extremity angiography third order catheter placement  3.  Right upper extremity angiography third order catheter placement  4.  Start close right common femoral   Anesthesia: Conscious sedation was administered by the interventional radiology RN under my direct supervision. IV Versed plus fentanyl were utilized. Continuous ECG, pulse oximetry and blood pressure was monitored throughout the entire procedure.  Conscious sedation was administered for a total of 32 minutes.  Sheath: 5 French right common femoral  Contrast: 40 cc   Fluoroscopy Time: 5.8 minutes  Indications:  The patient is being evaluated for new upper extremity AV access. Duplex ultrasound of the arterial system demonstrated monophasic signals in the brachial arteries. He is therefore undergoing angiography with the hope for intervention in preparation for AV access creation.  Procedure:  [name] a 61 y.o. male who was identified and appropriate procedural time out was performed.  The patient was then placed supine on the table and prepped and draped in the usual sterile fashion.  Ultrasound was used to evaluate the right common femoral artery.  It was echolucent and pulsatile indicating it is patent.  An ultrasound image was acquired for the permanent record.  A micropuncture needle was used to access the right common femoral artery under direct ultrasound guidance.  The microwire was then advanced under fluoroscopic guidance without difficulty followed by the micro-sheath.  A 0.035 J wire was advanced without  resistance and a 5Fr sheath was placed.    Pigtail catheter was then advanced to the level of the ascending aorta and and LAO projection of the aortic arch was obtained. Pigtail catheter was then exchanged for an H1 catheter. Initially the left subclavian was selected hand injection contrast was then used to demonstrate the left subclavian anatomy. The wire was reintroduced and the catheter negotiated into the brachial artery. Distal runoff was then obtained. After review these images the catheter wire combination were pulled back into the aortic arch and the right innominate and then subclavian were selected. Again hand injection of contrast was used to demonstrate the subclavian and axillary arterial anatomy wire was reintroduced catheter was advanced into the brachial artery and distal runoff was obtained.   After review of the images the catheter was removed over wire and an RAO view of the groin was obtained. StarClose device was deployed without difficulty.   Findings:   Thoracic Aortogram:  The arch is widely patent no significant atheromatous changes noted. Bovine arch anatomy is identified. The origins of the great vessels are all widely patent  Right upper Extremity:  The right subclavian and axillary arteries are widely patent. At the level of the axilla there is a high bifurcation of the radial artery. Radial and brachial arteries are then patent down to the elbow. The radial artery continues down to the hand and fills the entire palmer arch and all 5 digits. There appears to be mild to moderate diffuse small vessel disease. The distal brachial artery bifurcates into the interosseous as well as the ulnar. Both of these arteries demonstrate increasing disease and at the level of the wrist are essentially occluded.  Left upper Extremity:  The left subclavian and axillary arteries are  widely patent. At the level of the axilla there again is a high bifurcation of the radial artery. Radial and  brachial arteries are then patent down to the elbow. The ulnar artery is patent filling the palmar arch. Distal radial artery is nonvisualized. The existing fistula appears to actually originate from a large interosseous. Distal to the fistula anastomosis the interosseous is nonvisualized.  Distal small vessel disease is again noted although it does not appear as severe as on the right   The left arm appears to have better arterial flow, it is more rapid than the right suggesting the stool small vessel disease on the left is somewhat less than the right. I do not find evidence of significant atherosclerotic inflow disease and therefore we should be able to move forward with creation of a left upper arm AV access. I would plan for venography at the time of surgery and should the innominate vein stenosis on the left be recurrent then a hero graft could be placed. If not a brachial axillary graft could be placed plus minus further intervention of the innominate vein   Disposition: Patient was taken to the recovery room in stable condition having tolerated the procedure well.  [name] 06/29/2016,9:50 AM

## 2016-06-29 NOTE — Progress Notes (Signed)
Patient has finished lunch. States he is having pain at the groin site and requesting RX of pain medication. Told he could have one dose before going home but patient is insisting of RX prescription. MD states no RX for patient. Patient informed.

## 2016-06-29 NOTE — H&P (Signed)
[facility] Admission History & Physical  MRN : [id number]  [name] is a 61 y.o. ([birth year]) male who presents with chief complaint of No chief complaint on file. [name]  History of Present Illness: The patient presents to special procedures for evaluation of his arterial inflow in preparation for creation of a new upper extremity access. This is based on several factors. Recently he had a fistulogram to assess his left radiocephalic fistula. 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 In preparation for new access duplex ultrasound demonstrated monophasic brachial signals on the left. I therefore recommended upper extremity angiography. The patient was under the impression that he was here for removal of his tunneled catheter. However, when questioned as to how he would receive his dialysis access if we remove the catheter he had no response.  Of note, he does note that his hand is numb during dialysis even while receiving dialysis through the catheter but he blames this on how coldly keep the center. He denies significant hand pain stating it aches every once in a while.  At this point the catheter has been working fine. There are no history of fever or chills.  Current Facility-Administered Medications  Medication Dose Route Frequency Provider Last Rate Last Dose  . 0.9 %  sodium chloride infusion   Intravenous Continuous Stegmayer, Kimberly A, PA-C      . ceFAZolin (ANCEF) IVPB 1 g/50 mL premix  1 g Intravenous Once Stegmayer, Kimberly A, PA-C      . HYDROmorphone (DILAUDID) injection 1 mg  1 mg Intravenous Once PRN Stegmayer, Kimberly A, PA-C      . ondansetron (ZOFRAN) injection 4 mg  4 mg Intravenous Q6H PRN Stegmayer, Janalyn Harder, PA-C        Past Medical History:  Diagnosis Date  . Chest pain    a.  09/2013 [facility]): basal inf defect more pronounced @ rest, likely artifact, EF 48%, low risk study.  . Chronic combined systolic and diastolic CHF (congestive heart failure) ([place])    a. 10/2013 Echo [facility]): EF 50%, diast dysfxn, triv AI/TR, mild MR;  b. 10/2014 Echo: EF 30-35%, diff HK, gr1 DD, mild AI, mild to mod MR/TR, mod dil LA, nl RV, mod to sev PAH;  c. 04/2016 Echo: EF 20-25%, sev dil LV, diff HK, gr3 DD, mod AI, MV syst bowing w/ prolapse, mod dil RV, mod TR.  [name] ESRD (end stage renal disease) ([place])    a. MWF dialysis @ [facility].  . Hypertensive heart disease with CHF (congestive heart failure) ([place])   . Peripheral vascular disease ([place])   . Pulmonary hypertension ([place]) 04/2016    Past Surgical History:  Procedure Laterality Date  . A/V SHUNT INTERVENTION N/A 04/06/2016   Procedure: A/V Shunt Intervention;  Surgeon: Katha Cabal, MD;  Location: [place] [facility];  Service: Cardiovascular;  Laterality: N/A;  . A/V SHUNTOGRAM Left 04/06/2016   Procedure: A/V Fistulagram;  Surgeon: Katha Cabal, MD;  Location: [place] [facility];  Service: Cardiovascular;  Laterality: Left;  . AV FISTULA PLACEMENT Left 2014  . PERIPHERAL VASCULAR CATHETERIZATION N/A 10/03/2014   Procedure: A/V Shuntogram/Fistulagram;  Surgeon: Algernon Huxley, MD;  Location: [place] [facility];  Service: Cardiovascular;  Laterality: N/A;  . PERIPHERAL VASCULAR CATHETERIZATION Left 10/03/2014   Procedure:  A/V Shunt Intervention;  Surgeon: Algernon Huxley, MD;  Location: [place] [facility];  Service: Cardiovascular;  Laterality: Left;  . PERIPHERAL VASCULAR CATHETERIZATION Left 05/01/2015   Procedure: A/V Shuntogram/Fistulagram;  Surgeon: Algernon Huxley, MD;  Location: [place] [facility];  Service: Cardiovascular;  Laterality: Left;  . PERIPHERAL VASCULAR CATHETERIZATION N/A 05/01/2015   Procedure: A/V Shunt Intervention;  Surgeon: Algernon Huxley, MD;  Location: [place] [facility];  Service: Cardiovascular;   Laterality: N/A;    Social History Social History  Substance Use Topics  . Smoking status: Current Some Day Smoker    Packs/day: 0.25    Years: 40.00    Types: Cigarettes  . Smokeless tobacco: Never Used  . Alcohol use No    Family History Family History  Problem Relation Age of Onset  . Hypertension Son   . Diabetes Son   . Hypertension Mother   . Diabetes Mother   . Diabetes Sister   No family history of bleeding/clotting disorders, porphyria or autoimmune disease   Allergies  Allergen Reactions  . Sulfa Antibiotics Rash     REVIEW OF SYSTEMS (Negative unless checked)  Constitutional: [] Weight loss  [] Fever  [] Chills Cardiac: [] Chest pain   [] Chest pressure   [] Palpitations   [] Shortness of breath when laying flat   [] Shortness of breath at rest   [x] Shortness of breath with exertion. Vascular:  [] Pain in legs with walking   [] Pain in legs at rest   [] Pain in legs when laying flat   [] Claudication   [] Pain in feet when walking  [] Pain in feet at rest  [] Pain in feet when laying flat   [] History of DVT   [] Phlebitis   [] Swelling in legs   [] Varicose veins   [] Non-healing ulcers Pulmonary:   [] Uses home oxygen   [] Productive cough   [] Hemoptysis   [] Wheeze  [] COPD   [] Asthma Neurologic:  [] Dizziness  [] Blackouts   [] Seizures   [] History of stroke   [] History of TIA  [] Aphasia   [] Temporary blindness   [] Dysphagia   [] Weakness or numbness in arms   [] Weakness or numbness in legs Musculoskeletal:  [] Arthritis   [] Joint swelling   [] Joint pain   [] Low back pain Hematologic:  [] Easy bruising  [] Easy bleeding   [] Hypercoagulable state   [] Anemic  [] Hepatitis Gastrointestinal:  [] Blood in stool   [] Vomiting blood  [] Gastroesophageal reflux/heartburn   [] Difficulty swallowing. Genitourinary:  [x] Chronic kidney disease   [] Difficult urination  [] Frequent urination  [] Burning with urination   [] Blood in urine Skin:  [] Rashes   [] Ulcers   [] Wounds Psychological:  [] History of anxiety    []  History of major depression.  Physical Examination  Vitals:   06/29/16 0811  BP: (!) 149/88  Pulse: 67  Resp: 16  Temp: 97.8 F (36.6 C)  TempSrc: Oral  SpO2: 96%  Weight: 76.2 kg (168 lb)  Height: 5\' 6"  (1.676 m)   Body mass index is 27.12 kg/m. Gen: WD/WN, NAD Head: [place]/AT, No temporalis wasting. Prominent temp pulse not noted. Ear/Nose/Throat: Hearing grossly intact, nares w/o erythema or drainage, oropharynx w/o Erythema/Exudate,  Eyes: Conjunctiva clear, sclera non-icteric Neck: Trachea midline.  No JVD.  Pulmonary:  Good air movement, respirations not labored, no use of accessory muscles.  Cardiac: RRR, normal S1, S2. Vascular: The right IJ tunnel catheter is clean and intact nontender no drainage no erythema at the exit site. The left radiocephalic fistula is markedly aneurysmal. It is minimally pulsatile. There is no thrill. There  is a very weak bruit. Vessel Right Left  Radial Not Palpable Not Palpable  Ulnar Not Palpable Not Palpable  Brachial 1+ Palpable Trace Palpable  Carotid Palpable, without bruit Palpable, without bruit  Gastrointestinal: soft, non-tender/non-distended. No guarding/reflex.  Musculoskeletal: M/S 5/5 throughout.  Extremities without ischemic changes.  No deformity or atrophy.  Neurologic: Sensation grossly intact in extremities.  Symmetrical.  Speech is fluent. Motor exam as listed above. Psychiatric: Judgment intact, Mood & affect appropriate for pt's clinical situation. Dermatologic: No rashes or ulcers noted.  No cellulitis or open wounds. Lymph : No Cervical, Axillary, or Inguinal lymphadenopathy.     CBC Lab Results  Component Value Date   WBC 5.3 06/07/2016   HGB 11.4 (L) 06/07/2016   HCT 34.1 (L) 06/07/2016   MCV 92.7 06/07/2016   PLT 187 06/07/2016    BMET    Component Value Date/Time   NA 139 06/08/2016 0440   NA 139 04/30/2013 1203   K 4.3 06/08/2016 0440   K 4.4 04/30/2013 1203   CL 102 06/08/2016 0440   CL 107  04/30/2013 1203   CO2 27 06/08/2016 0440   CO2 25 04/30/2013 1203   GLUCOSE 110 (H) 06/08/2016 0440   GLUCOSE 104 (H) 04/30/2013 1203   BUN 43 (H) 06/08/2016 0440   BUN 76 (H) 04/30/2013 1203   CREATININE 6.81 (H) 06/08/2016 0440   CREATININE 9.49 (H) 04/30/2013 1203   CALCIUM 8.7 (L) 06/08/2016 0440   CALCIUM 8.0 (L) 04/30/2013 1203   GFRNONAA 8 (L) 06/08/2016 0440   GFRNONAA 5 (L) 04/30/2013 1203   GFRAA 9 (L) 06/08/2016 0440   GFRAA 6 (L) 04/30/2013 1203   CrCl cannot be calculated (Patient's most recent lab result is older than the maximum 21 days allowed.).  COAG Lab Results  Component Value Date   INR 1.1 04/26/2013   INR 1.0 07/19/2012   INR 1.2 12/12/2011    Radiology Dg Chest 2 View  Result Date: 06/06/2016 CLINICAL DATA:  Acute dyspnea, edema EXAM: CHEST  2 VIEW COMPARISON:  04/29/2016 chest radiograph. FINDINGS: Right internal jugular central venous catheter terminates at the cavoatrial junction. Stable cardiomediastinal silhouette with cardiomegaly and aortic atherosclerosis. No pneumothorax. No pleural effusion. Mild pulmonary edema. No acute consolidative airspace disease. IMPRESSION: Mild congestive heart failure. Aortic atherosclerosis. Electronically Signed   By: Ilona Sorrel M.D.   On: 06/06/2016 22:48     Assessment/Plan: 1.  Complication dialysis device with subtotal occlusion of left wrist AV access:  Patient's left radiocephalic fistula dialysis access is malfunctioning and is not a reasonable access for dialysis.  He had an ultrasound which demonstrated a monophasic arterial flow. The patient will undergo upper extremity arteriography and correction of any problems using interventional techniques with the hope of preparing an upper extremity for a new AV access.  The risks and benefits were described to the patient.  All questions were answered.  The patient agrees to proceed with angiography and intervention. Potassium will be drawn to ensure that it is an  appropriate level prior to performing intervention.   A total of 45 minutes was spent with this patient and greater than 50% was spent in counseling and coordination of care with the patient.  Discussion included the treatment options for vascular disease including indications for surgery and intervention.  Also discussed is the appropriate timing of treatment.  In addition medical therapy was discussed.  2.  End-stage renal disease requiring hemodialysis:  Patient will continue dialysis therapy without further interruption  via his functioning right IJ tunneled catheter will be placed. Dialysis has already been arranged. 3.  Hypertension:  Patient will continue medical management; nephrology is following no changes in oral medications. 4. Diabetes mellitus:  Glucose will be monitored and oral medications been held this morning once the patient has undergone the patient's procedure po intake will be reinitiated and again Accu-Cheks will be used to assess the blood glucose level and treat as needed. The patient will be restarted on the patient's usual hypoglycemic regime    Hortencia Pilar, MD  06/29/2016 8:32 AM

## 2016-06-29 NOTE — Telephone Encounter (Signed)
Spoke with [facility]. Lab work will not be resulted until tomorrow. Will check back at that time.

## 2016-06-30 ENCOUNTER — Encounter: Payer: Self-pay | Admitting: Vascular Surgery

## 2016-07-01 ENCOUNTER — Encounter: Payer: Self-pay | Admitting: Vascular Surgery

## 2016-07-01 NOTE — Telephone Encounter (Signed)
Spoke with Ivin Booty at [facility].  She will check and see if labs have resulted and then fax to [phone].

## 2016-07-04 ENCOUNTER — Emergency Department: Payer: Medicare Other

## 2016-07-04 ENCOUNTER — Inpatient Hospital Stay
Admission: EM | Admit: 2016-07-04 | Discharge: 2016-07-05 | DRG: 640 | Disposition: A | Discharge status: Home / Self Care | Payer: Medicare Other | Attending: Internal Medicine | Admitting: Internal Medicine

## 2016-07-04 ENCOUNTER — Encounter: Payer: Self-pay | Admitting: Emergency Medicine

## 2016-07-04 DIAGNOSIS — E1122 Type 2 diabetes mellitus with diabetic chronic kidney disease: Secondary | ICD-10-CM | POA: Diagnosis present

## 2016-07-04 DIAGNOSIS — Z79899 Other long term (current) drug therapy: Secondary | ICD-10-CM | POA: Diagnosis not present

## 2016-07-04 DIAGNOSIS — N2581 Secondary hyperparathyroidism of renal origin: Secondary | ICD-10-CM | POA: Diagnosis present

## 2016-07-04 DIAGNOSIS — I5043 Acute on chronic combined systolic (congestive) and diastolic (congestive) heart failure: Secondary | ICD-10-CM | POA: Diagnosis present

## 2016-07-04 DIAGNOSIS — R748 Abnormal levels of other serum enzymes: Secondary | ICD-10-CM | POA: Diagnosis not present

## 2016-07-04 DIAGNOSIS — I739 Peripheral vascular disease, unspecified: Secondary | ICD-10-CM | POA: Diagnosis present

## 2016-07-04 DIAGNOSIS — Z882 Allergy status to sulfonamides status: Secondary | ICD-10-CM | POA: Diagnosis not present

## 2016-07-04 DIAGNOSIS — I272 Pulmonary hypertension, unspecified: Secondary | ICD-10-CM | POA: Diagnosis present

## 2016-07-04 DIAGNOSIS — E875 Hyperkalemia: Principal | ICD-10-CM

## 2016-07-04 DIAGNOSIS — N186 End stage renal disease: Secondary | ICD-10-CM

## 2016-07-04 DIAGNOSIS — R9431 Abnormal electrocardiogram [ECG] [EKG]: Secondary | ICD-10-CM

## 2016-07-04 DIAGNOSIS — I5042 Chronic combined systolic (congestive) and diastolic (congestive) heart failure: Secondary | ICD-10-CM

## 2016-07-04 DIAGNOSIS — I132 Hypertensive heart and chronic kidney disease with heart failure and with stage 5 chronic kidney disease, or end stage renal disease: Secondary | ICD-10-CM | POA: Diagnosis present

## 2016-07-04 DIAGNOSIS — Z9111 Patient's noncompliance with dietary regimen: Secondary | ICD-10-CM

## 2016-07-04 DIAGNOSIS — R0602 Shortness of breath: Secondary | ICD-10-CM | POA: Diagnosis present

## 2016-07-04 DIAGNOSIS — I248 Other forms of acute ischemic heart disease: Secondary | ICD-10-CM | POA: Diagnosis present

## 2016-07-04 DIAGNOSIS — D631 Anemia in chronic kidney disease: Secondary | ICD-10-CM | POA: Diagnosis present

## 2016-07-04 DIAGNOSIS — Z7982 Long term (current) use of aspirin: Secondary | ICD-10-CM

## 2016-07-04 DIAGNOSIS — F17210 Nicotine dependence, cigarettes, uncomplicated: Secondary | ICD-10-CM | POA: Diagnosis present

## 2016-07-04 DIAGNOSIS — Z8249 Family history of ischemic heart disease and other diseases of the circulatory system: Secondary | ICD-10-CM

## 2016-07-04 DIAGNOSIS — R0600 Dyspnea, unspecified: Secondary | ICD-10-CM

## 2016-07-04 DIAGNOSIS — R7989 Other specified abnormal findings of blood chemistry: Secondary | ICD-10-CM

## 2016-07-04 DIAGNOSIS — R778 Other specified abnormalities of plasma proteins: Secondary | ICD-10-CM

## 2016-07-04 DIAGNOSIS — Z992 Dependence on renal dialysis: Secondary | ICD-10-CM

## 2016-07-04 DIAGNOSIS — Z833 Family history of diabetes mellitus: Secondary | ICD-10-CM

## 2016-07-04 HISTORY — DX: Disorder of kidney and ureter, unspecified: N28.9

## 2016-07-04 LAB — CBC WITH DIFFERENTIAL/PLATELET
Basophils Absolute: 0 10*3/uL (ref 0–0.1)
Basophils Relative: 1 %
EOS PCT: 0 %
Eosinophils Absolute: 0 10*3/uL (ref 0–0.7)
HCT: 42.6 % (ref 40.0–52.0)
Hemoglobin: 13.9 g/dL (ref 13.0–18.0)
Lymphocytes Relative: 15 %
Lymphs Abs: 1 10*3/uL (ref 1.0–3.6)
MCH: 30.5 pg (ref 26.0–34.0)
MCHC: 32.5 g/dL (ref 32.0–36.0)
MCV: 93.9 fL (ref 80.0–100.0)
Monocytes Absolute: 0.8 10*3/uL (ref 0.2–1.0)
Monocytes Relative: 11 %
NEUTROS ABS: 5 10*3/uL (ref 1.4–6.5)
Neutrophils Relative %: 73 %
PLATELETS: 194 10*3/uL (ref 150–440)
RBC: 4.54 MIL/uL (ref 4.40–5.90)
RDW: 17.3 % — AB (ref 11.5–14.5)
WBC: 6.9 10*3/uL (ref 3.8–10.6)

## 2016-07-04 LAB — BASIC METABOLIC PANEL
Anion gap: 17 — ABNORMAL HIGH (ref 5–15)
BUN: 67 mg/dL — AB (ref 6–20)
CHLORIDE: 98 mmol/L — AB (ref 101–111)
CO2: 21 mmol/L — AB (ref 22–32)
CREATININE: 8.95 mg/dL — AB (ref 0.61–1.24)
Calcium: 10.1 mg/dL (ref 8.9–10.3)
GFR calc Af Amer: 6 mL/min — ABNORMAL LOW (ref >60)
GFR calc non Af Amer: 6 mL/min — ABNORMAL LOW (ref >60)
Glucose, Bld: 68 mg/dL (ref 65–99)
Sodium: 136 mmol/L (ref 135–145)

## 2016-07-04 LAB — GLUCOSE, CAPILLARY
GLUCOSE-CAPILLARY: 87 mg/dL (ref 65–99)
Glucose-Capillary: 155 mg/dL — ABNORMAL HIGH (ref 65–99)

## 2016-07-04 LAB — POTASSIUM: Potassium: 3.6 mmol/L (ref 3.5–5.1)

## 2016-07-04 LAB — TROPONIN I
TROPONIN I: 0.24 ng/mL — AB (ref <0.03)
Troponin I: 0.2 ng/mL (ref <0.03)

## 2016-07-04 MED ORDER — CALCIUM GLUCONATE 10 % IV SOLN
1.0000 g | Freq: Once | INTRAVENOUS | Status: AC
  Administered 2016-07-04: 1 g via INTRAVENOUS
  Filled 2016-07-04: qty 10

## 2016-07-04 MED ORDER — NITROGLYCERIN 2 % TD OINT
1.0000 [in_us] | TOPICAL_OINTMENT | Freq: Four times a day (QID) | TRANSDERMAL | Status: DC
  Administered 2016-07-04 – 2016-07-05 (×3): 1 [in_us] via TOPICAL
  Filled 2016-07-04 (×3): qty 1

## 2016-07-04 MED ORDER — ALBUTEROL SULFATE (2.5 MG/3ML) 0.083% IN NEBU
5.0000 mg | INHALATION_SOLUTION | Freq: Once | RESPIRATORY_TRACT | Status: AC
  Administered 2016-07-04: 5 mg via RESPIRATORY_TRACT
  Filled 2016-07-04: qty 6

## 2016-07-04 MED ORDER — CALCIUM ACETATE (PHOS BINDER) 667 MG PO CAPS
2001.0000 mg | ORAL_CAPSULE | Freq: Three times a day (TID) | ORAL | Status: DC
  Administered 2016-07-05 (×2): 2001 mg via ORAL
  Filled 2016-07-04 (×2): qty 3

## 2016-07-04 MED ORDER — HYDRALAZINE HCL 50 MG PO TABS
50.0000 mg | ORAL_TABLET | Freq: Three times a day (TID) | ORAL | Status: DC
  Administered 2016-07-05: 50 mg via ORAL
  Filled 2016-07-04: qty 1

## 2016-07-04 MED ORDER — ALBUTEROL SULFATE (2.5 MG/3ML) 0.083% IN NEBU
10.0000 mg | INHALATION_SOLUTION | Freq: Once | RESPIRATORY_TRACT | Status: AC
  Administered 2016-07-04: 10 mg via RESPIRATORY_TRACT
  Filled 2016-07-04: qty 12

## 2016-07-04 MED ORDER — ACETAMINOPHEN 650 MG RE SUPP: 650.0000 mg | Freq: Four times a day (QID) | RECTAL | Status: DC | PRN

## 2016-07-04 MED ORDER — FAMOTIDINE IN NACL 20-0.9 MG/50ML-% IV SOLN: 20.0000 mg | Freq: Two times a day (BID) | INTRAVENOUS | Status: DC

## 2016-07-04 MED ORDER — HEPARIN SODIUM (PORCINE) 1000 UNIT/ML DIALYSIS: 1000.0000 [IU] | INTRAMUSCULAR | Status: DC | PRN

## 2016-07-04 MED ORDER — SODIUM CHLORIDE 0.9 % IV SOLN
INTRAVENOUS | Status: DC
  Administered 2016-07-04: 18:00:00 via INTRAVENOUS

## 2016-07-04 MED ORDER — ONDANSETRON HCL 4 MG PO TABS: 4.0000 mg | ORAL_TABLET | Freq: Four times a day (QID) | ORAL | Status: DC | PRN

## 2016-07-04 MED ORDER — LOSARTAN POTASSIUM 50 MG PO TABS
50.0000 mg | ORAL_TABLET | Freq: Every day | ORAL | Status: DC
  Administered 2016-07-04: 50 mg via ORAL
  Filled 2016-07-04: qty 1

## 2016-07-04 MED ORDER — LIDOCAINE HCL (PF) 1 % IJ SOLN
5.0000 mL | INTRAMUSCULAR | Status: DC | PRN
  Filled 2016-07-04: qty 5

## 2016-07-04 MED ORDER — SODIUM CHLORIDE 0.9 % IV SOLN: 100.0000 mL | INTRAVENOUS | Status: DC | PRN

## 2016-07-04 MED ORDER — ALTEPLASE 2 MG IJ SOLR: 2.0000 mg | Freq: Once | INTRAMUSCULAR | Status: DC | PRN

## 2016-07-04 MED ORDER — INSULIN ASPART 100 UNIT/ML ~~LOC~~ SOLN
10.0000 [IU] | Freq: Once | SUBCUTANEOUS | Status: AC
  Administered 2016-07-04: 10 [IU] via INTRAVENOUS

## 2016-07-04 MED ORDER — CINACALCET HCL 30 MG PO TABS
90.0000 mg | ORAL_TABLET | Freq: Every day | ORAL | Status: DC
  Administered 2016-07-05: 90 mg via ORAL
  Filled 2016-07-04: qty 3

## 2016-07-04 MED ORDER — INSULIN ASPART 100 UNIT/ML ~~LOC~~ SOLN
SUBCUTANEOUS | Status: AC
  Filled 2016-07-04: qty 10

## 2016-07-04 MED ORDER — HYDRALAZINE HCL 20 MG/ML IJ SOLN
10.0000 mg | Freq: Four times a day (QID) | INTRAMUSCULAR | Status: DC | PRN
  Administered 2016-07-04: 10 mg via INTRAVENOUS
  Filled 2016-07-04: qty 1

## 2016-07-04 MED ORDER — IPRATROPIUM-ALBUTEROL 0.5-2.5 (3) MG/3ML IN SOLN: 3.0000 mL | Freq: Four times a day (QID) | RESPIRATORY_TRACT | Status: DC

## 2016-07-04 MED ORDER — CALCIUM ACETATE (PHOS BINDER) 667 MG PO CAPS
1334.0000 mg | ORAL_CAPSULE | ORAL | Status: DC
  Administered 2016-07-05 (×2): 1334 mg via ORAL
  Filled 2016-07-04 (×2): qty 2

## 2016-07-04 MED ORDER — SODIUM POLYSTYRENE SULFONATE 15 GM/60ML PO SUSP
30.0000 g | Freq: Once | ORAL | Status: AC
  Administered 2016-07-04: 30 g via ORAL
  Filled 2016-07-04: qty 120

## 2016-07-04 MED ORDER — ACETAMINOPHEN 325 MG PO TABS
650.0000 mg | ORAL_TABLET | Freq: Four times a day (QID) | ORAL | Status: DC | PRN
Start: 2016-07-04 — End: 2016-07-05

## 2016-07-04 MED ORDER — BISACODYL 10 MG RE SUPP: 10.0000 mg | Freq: Every day | RECTAL | Status: DC | PRN

## 2016-07-04 MED ORDER — DEXTROSE 50 % IV SOLN
2.0000 | Freq: Once | INTRAVENOUS | Status: AC
  Administered 2016-07-04: 100 mL via INTRAVENOUS
  Filled 2016-07-04: qty 100

## 2016-07-04 MED ORDER — CALCIUM ACETATE (PHOS BINDER) 667 MG PO CAPS: 1334.0000 mg | ORAL_CAPSULE | Freq: Three times a day (TID) | ORAL | Status: DC

## 2016-07-04 MED ORDER — CARVEDILOL 6.25 MG PO TABS: 6.2500 mg | ORAL_TABLET | Freq: Two times a day (BID) | ORAL | Status: DC

## 2016-07-04 MED ORDER — ONDANSETRON HCL 4 MG/2ML IJ SOLN: 4.0000 mg | Freq: Four times a day (QID) | INTRAMUSCULAR | Status: DC | PRN

## 2016-07-04 MED ORDER — ALBUTEROL SULFATE (2.5 MG/3ML) 0.083% IN NEBU: 3.0000 mL | INHALATION_SOLUTION | RESPIRATORY_TRACT | Status: DC | PRN

## 2016-07-04 MED ORDER — SODIUM BICARBONATE 8.4 % IV SOLN
50.0000 meq | Freq: Once | INTRAVENOUS | Status: AC
  Administered 2016-07-04: 50 meq via INTRAVENOUS
  Filled 2016-07-04: qty 50

## 2016-07-04 MED ORDER — HEPARIN SODIUM (PORCINE) 5000 UNIT/ML IJ SOLN
5000.0000 [IU] | Freq: Three times a day (TID) | INTRAMUSCULAR | Status: DC
  Administered 2016-07-05 (×2): 5000 [IU] via SUBCUTANEOUS
  Filled 2016-07-04 (×2): qty 1

## 2016-07-04 MED ORDER — PENTAFLUOROPROP-TETRAFLUOROETH EX AERO
1.0000 "application " | INHALATION_SPRAY | CUTANEOUS | Status: DC | PRN
  Filled 2016-07-04: qty 30

## 2016-07-04 MED ORDER — FAMOTIDINE IN NACL 20-0.9 MG/50ML-% IV SOLN
20.0000 mg | INTRAVENOUS | Status: DC
  Administered 2016-07-04: 20 mg via INTRAVENOUS
  Filled 2016-07-04: qty 50

## 2016-07-04 MED ORDER — IOPAMIDOL (ISOVUE-370) INJECTION 76%
75.0000 mL | Freq: Once | INTRAVENOUS | Status: AC | PRN
  Administered 2016-07-04: 75 mL via INTRAVENOUS

## 2016-07-04 MED ORDER — DOCUSATE SODIUM 100 MG PO CAPS
100.0000 mg | ORAL_CAPSULE | Freq: Two times a day (BID) | ORAL | Status: DC
  Administered 2016-07-05: 100 mg via ORAL
  Filled 2016-07-04: qty 1

## 2016-07-04 MED ORDER — LIDOCAINE-PRILOCAINE 2.5-2.5 % EX CREA
1.0000 "application " | TOPICAL_CREAM | CUTANEOUS | Status: DC | PRN
  Filled 2016-07-04: qty 5

## 2016-07-04 MED ORDER — ASPIRIN EC 81 MG PO TBEC
81.0000 mg | DELAYED_RELEASE_TABLET | Freq: Every day | ORAL | Status: DC
  Administered 2016-07-05 (×2): 81 mg via ORAL
  Filled 2016-07-04 (×2): qty 1

## 2016-07-04 NOTE — Consult Note (Signed)
PULMONARY / CRITICAL CARE MEDICINE   Name: [name] MRN: [id number] DOB: 1955/08/08    ADMISSION DATE:  07/04/2016   CONSULTATION DATE:  07/04/2016  REFERRING MD:  Dr. Doy Hutching  REASON: Need for emergent hemodialysis  CHIEF COMPLAINT: acute dyspnea   HISTORY OF PRESENT ILLNESS:   61 Y/O male with ESRD ON hd M-W-F, chronic systolic and diastolic heart failure, PVD, and hypertension presenting with acute dyspnea. Patient had HD on Friday but went out for dinner on Saturday and had collaid and sweet tea. States that he started started feeling dyspneic at night and his symptoms progressively got worse hence he decided to come to the ED. AT the ED his K+ was >7. He is being admitted to the ICU for emergent HD  PAST MEDICAL HISTORY :  He  has a past medical history of Chest pain; Chronic combined systolic and diastolic CHF (congestive heart failure) ([place]); ESRD (end stage renal disease) ([place]); Hypertension; Hypertensive heart disease with CHF (congestive heart failure) ([place]); Peripheral vascular disease ([place]); Pulmonary hypertension ([place]) (04/2016); and Renal insufficiency.  PAST SURGICAL HISTORY: He  has a past surgical history that includes AV fistula placement (Left, 2014); Cardiac catheterization (N/A, 10/03/2014); Cardiac catheterization (Left, 10/03/2014); Cardiac catheterization (Left, 05/01/2015); Cardiac catheterization (N/A, 05/01/2015); A/V Fistulagram (Left, 04/06/2016); A/V Shunt Intervention (N/A, 04/06/2016); Upper Extremity Angiography (Bilateral, 06/29/2016); Upper Extremity Intervention (06/29/2016); and A/V Shunt Intervention (N/A, 06/29/2016).  Allergies  Allergen Reactions  . Sulfa Antibiotics Rash    No current facility-administered medications on file prior to encounter.    Current Outpatient Prescriptions on File Prior to Encounter  Medication Sig  . albuterol (PROVENTIL HFA;VENTOLIN HFA) 108 (90 Base) MCG/ACT inhaler Inhale into the lungs every 4 (four) hours as needed for  wheezing or shortness of breath.  [name] aspirin EC 81 MG tablet Take 1 tablet (81 mg total) by mouth daily.  . calcium acetate (PHOSLO) 667 MG capsule Take 1,334-2,001 mg by mouth 3 (three) times daily with meals. Take 2001 mgs with meals 3 times daily and 1334 with snacks  . Calcium Carbonate Antacid (TUMS ULTRA 1000 PO) Take 2 tablets by mouth 3 (three) times daily before meals.  . carvedilol (COREG) 6.25 MG tablet Take 1 tablet (6.25 mg total) by mouth 2 (two) times daily with a meal.  . cinacalcet (SENSIPAR) 90 MG tablet Take 90 mg by mouth daily.  [name] ipratropium (ATROVENT HFA) 17 MCG/ACT inhaler Inhale 2 puffs into the lungs every 4 (four) hours as needed for wheezing.  . isosorbide mononitrate (IMDUR) 30 MG 24 hr tablet Take 1 tablet (30 mg total) by mouth daily.  [name] lidocaine-prilocaine (EMLA) cream Apply 1 application topically as needed (dialysis access).  [name] losartan (COZAAR) 25 MG tablet Take 1 tablet (25 mg total) by mouth daily.    FAMILY HISTORY:  His indicated that his mother is deceased. He indicated that his sister is alive. He indicated that his son is alive.    SOCIAL HISTORY: He  reports that he has been smoking Cigarettes.  He has a 10.00 pack-year smoking history. He has never used smokeless tobacco. He reports that he uses drugs, including Cocaine, about 1 time per week. He reports that he does not drink alcohol.  REVIEW OF SYSTEMS:   Constitutional: Negative for fever and chills.  HENT: Negative for congestion and rhinorrhea.  Eyes: Negative for redness and visual disturbance.  Respiratory: Positive for shortness of breath and orthopnea Cardiovascular: Negative for chest pain and palpitations.  Gastrointestinal: Negative  for nausea , vomiting and abdominal pain and  Loose stools Genitourinary: Negative for dysuria and urgency.  Endocrine: Denies polyuria, polyphagia and heat intolerance Musculoskeletal: Negative for myalgias and arthralgias.  Skin: Negative for pallor  and wound.  Neurological: Negative for dizziness and headaches   SUBJECTIVE:   VITAL SIGNS: BP (!) 157/89   Pulse 80   Temp 97.6 F (36.4 C) (Oral)   Resp (!) 31   Ht 5\' 7"  (1.702 m)   Wt 176 lb (79.8 kg)   SpO2 99%   BMI 27.57 kg/m   HEMODYNAMICS:    VENTILATOR SETTINGS:    INTAKE / OUTPUT: I/O last 3 completed shifts: In: 25 [IV Piggyback:50] Out: -   PHYSICAL EXAMINATION: General: NAD, pleasant Neuro:  AAO X 4, no deficits HEENT: PERRLA Cardiovascular:  RRR, S1/S2, no MRG Lungs:  Normal WOB, + breath sounds, +crackles, diminished in the bases Abdomen: non-distended, soft, + BS x 4 Musculoskeletal:  No deformities Skin:  Warm and dry  LABS:  BMET  Recent Labs Lab 07/04/16 1611  NA 136  K >7.5*  CL 98*  CO2 21*  BUN 67*  CREATININE 8.95*  GLUCOSE 68    Electrolytes  Recent Labs Lab 07/04/16 1611  CALCIUM 10.1    CBC  Recent Labs Lab 07/04/16 1611  WBC 6.9  HGB 13.9  HCT 42.6  PLT 194    Coag's No results for input(s): APTT, INR in the last 168 hours.  Sepsis Markers No results for input(s): LATICACIDVEN, PROCALCITON, O2SATVEN in the last 168 hours.  ABG No results for input(s): PHART, PCO2ART, PO2ART in the last 168 hours.  Liver Enzymes No results for input(s): AST, ALT, ALKPHOS, BILITOT, ALBUMIN in the last 168 hours.  Cardiac Enzymes  Recent Labs Lab 07/04/16 1611  TROPONINI 0.24*    Glucose  Recent Labs Lab 07/04/16 1757 07/04/16 1930  GLUCAP 155* 87    Imaging Dg Chest 2 View  Result Date: 07/04/2016 CLINICAL DATA:  Shortness of breath. EXAM: CHEST  2 VIEW COMPARISON:  June 06, 2016 FINDINGS: Stable dialysis catheter. Stable cardiomegaly. The hila and mediastinum are normal. No focal infiltrates identified. No overt edema. IMPRESSION: No active cardiopulmonary disease. Electronically Signed   By: Dorise Bullion III M.D   On: 07/04/2016 15:38   Ct Angio Chest Pe W Or Wo Contrast  Result Date:  07/04/2016 CLINICAL DATA:  61 year old male with acute shortness of breath. Patient with end-stage renal disease. EXAM: CT ANGIOGRAPHY CHEST WITH CONTRAST TECHNIQUE: Multidetector CT imaging of the chest was performed using the standard protocol during bolus administration of intravenous contrast. Multiplanar CT image reconstructions and MIPs were obtained to evaluate the vascular anatomy. CONTRAST:  75 cc intravenous Isovue 370 COMPARISON:  07/05/2015 and prior chest radiographs FINDINGS: Cardiovascular: Satisfactory opacification of the pulmonary arteries to the segmental level. No evidence of pulmonary embolism. Marked cardiomegaly and moderate to heavy coronary artery calcifications noted. The ascending thoracic aorta measures 4.1 cm and the descending thoracic aorta measures 3 cm in greatest diameter. There is no evidence of pericardial effusion. Mediastinum/Nodes: No enlarged mediastinal, hilar, or axillary lymph nodes. Thyroid gland, trachea, and esophagus demonstrate no significant findings. Lungs/Pleura: Lungs are clear. No pleural effusion or pneumothorax. Upper Abdomen: A small amount of ascites adjacent to the liver is noted. There is an equivocal slightly nodular hepatic contour were and cirrhosis is not excluded. Musculoskeletal: No acute bony abnormality or suspicious focal bony lesions. Increased density of the visualized bony structures likely represents  renal osteodystrophy. Review of the MIP images confirms the above findings. IMPRESSION: No evidence of pulmonary emboli. Ascending thoracic aortic aneurysm measuring 4.1 cm in greatest diameter. Recommend annual imaging followup by CTA or MRA. This recommendation follows 2010 ACCF/AHA/AATS/ACR/ASA/SCA/SCAI/SIR/STS/SVM Guidelines for the Diagnosis and Management of Patients with Thoracic Aortic Disease. Circulation. 2010; 121: X902-I097 Marked cardiomegaly and coronary artery disease. Small amount of ascites within the upper abdomen. Equivocal  slightly nodular hepatic contour -cirrhosis not excluded. Electronically Signed   By: Margarette Canada M.D.   On: 07/04/2016 16:44   LINES/TUBES: Right Subsclavian HD catheter  DISCUSSION: 61 y/o male  With ESRD AND COMBINED s&dCHF presenting with hyperkalemia and volume overload 2/2 dietary non-adherence  ASSESSMENT  Severe hyperkalemia Acute CHF exacerbation Mildly elevated troponin-no significant EKG changes, likely due to myocardial strain from volume overload and ESRD   PLAN Emergent HD in progress Monitor and correct electrolytes Cycle troponin EKG Nephrology following Dietary modification teaching provided  FAMILY  - Updates: Patient updated on treatment plan  - Inter-disciplinary family meet or Palliative Care meeting due by:  day 7  Plan of care discussed with [facility] MD  Magdalene S. [facility] ANP-BC Pulmonary and Critical Care Medicine [facility] Pager [phone] or [phone] 07/04/2016, 8:03 PM   Merton Border, MD PCCM service Mobile [phone] Pager [phone] 07/06/2016 3:38 PM

## 2016-07-04 NOTE — Progress Notes (Signed)
PRE DIALYSIS ASSESSMENT 

## 2016-07-04 NOTE — ED Notes (Signed)
Report given to [facility] in ICU. Request for second IV site before pt is transferred up

## 2016-07-04 NOTE — ED Provider Notes (Signed)
[facility] Emergency Department Provider Note  ____________________________________________  Time seen: Approximately 4:59 PM  I have reviewed the triage vital signs and the nursing notes.   HISTORY  Chief Complaint Shortness of Breath  Level 5 caveat:  Portions of the history and physical were unable to be obtained due to the patient's poor historian   HPI [name] is a 61 y.o. male who complains of shortness of breath that started last night. Feels like its worst to walk worse to lay down flat. Is a history of volume overload and CHF.He's been compliant with his dialysis, when all of last week including most recently 2 days ago on Friday. She reports no problems with his dialysis recently getting his full treatments. Denies any changes in his diet. No chest pain shortness of breath. No fever or chills or productive cough.     Past Medical History:  Diagnosis Date  . Chest pain    a. 09/2013 Myoview [facility]): basal inf defect more pronounced @ rest, likely artifact, EF 48%, low risk study.  . Chronic combined systolic and diastolic CHF (congestive heart failure) ([place])    a. 10/2013 Echo [facility]): EF 50%, diast dysfxn, triv AI/TR, mild MR;  b. 10/2014 Echo: EF 30-35%, diff HK, gr1 DD, mild AI, mild to mod MR/TR, mod dil LA, nl RV, mod to sev PAH;  c. 04/2016 Echo: EF 20-25%, sev dil LV, diff HK, gr3 DD, mod AI, MV syst bowing w/ prolapse, mod dil RV, mod TR.  [name] ESRD (end stage renal disease) ([place])    a. MWF dialysis @ [facility].  . Hypertension   . Hypertensive heart disease with CHF (congestive heart failure) ([place])   . Peripheral vascular disease ([place])   . Pulmonary hypertension ([place]) 04/2016  . Renal insufficiency      Patient Active Problem List   Diagnosis Date Noted  . Elevated troponin   . Acute respiratory failure with hypoxia ([place]) 06/06/2016  . Chest pain 05/22/2016  . Sepsis ([place]) 02/26/2016  . HCAP (healthcare-associated pneumonia)  02/26/2016  . Diabetes ([place]) 02/26/2016  . Community acquired pneumonia 01/07/2016  . Renal dialysis device, implant, or graft complication 10/62/6948  . Tobacco use 09/04/2015  . ESRD (end stage renal disease) on dialysis ([place]) 08/02/2015  . Fluid overload 08/02/2015  . HTN (hypertension) 08/02/2015  . Chronic combined systolic and diastolic heart failure ([place]) 11/10/2014     Past Surgical History:  Procedure Laterality Date  . A/V SHUNT INTERVENTION N/A 04/06/2016   Procedure: A/V Shunt Intervention;  Surgeon: Katha Cabal, MD;  Location: [place] [facility];  Service: Cardiovascular;  Laterality: N/A;  . A/V SHUNT INTERVENTION N/A 06/29/2016   Procedure: A/V Shunt Intervention;  Surgeon: Katha Cabal, MD;  Location: [place] [facility];  Service: Cardiovascular;  Laterality: N/A;  . A/V SHUNTOGRAM Left 04/06/2016   Procedure: A/V Fistulagram;  Surgeon: Katha Cabal, MD;  Location: [place] [facility];  Service: Cardiovascular;  Laterality: Left;  . AV FISTULA PLACEMENT Left 2014  . PERIPHERAL VASCULAR CATHETERIZATION N/A 10/03/2014   Procedure: A/V Shuntogram/Fistulagram;  Surgeon: Algernon Huxley, MD;  Location: [place] [facility];  Service: Cardiovascular;  Laterality: N/A;  . PERIPHERAL VASCULAR CATHETERIZATION Left 10/03/2014   Procedure: A/V Shunt Intervention;  Surgeon: Algernon Huxley, MD;  Location: [place] [facility];  Service: Cardiovascular;  Laterality: Left;  . PERIPHERAL VASCULAR CATHETERIZATION Left 05/01/2015   Procedure: A/V Shuntogram/Fistulagram;  Surgeon: Algernon Huxley, MD;  Location: [place] [facility];  Service: Cardiovascular;  Laterality: Left;  . PERIPHERAL VASCULAR CATHETERIZATION N/A 05/01/2015   Procedure: A/V Shunt Intervention;  Surgeon: Algernon Huxley, MD;  Location: [place] [facility];  Service: Cardiovascular;  Laterality: N/A;  . UPPER EXTREMITY ANGIOGRAPHY Bilateral 06/29/2016   Procedure: Upper Extremity Angiography;  Surgeon: Katha Cabal,  MD;  Location: [place] [facility];  Service: Cardiovascular;  Laterality: Bilateral;  . UPPER EXTREMITY INTERVENTION  06/29/2016   Procedure: Upper Extremity Intervention;  Surgeon: Katha Cabal, MD;  Location: [place] [facility];  Service: Cardiovascular;;     Prior to Admission medications   Medication Sig Start Date End Date Taking? Authorizing Provider  albuterol (PROVENTIL HFA;VENTOLIN HFA) 108 (90 Base) MCG/ACT inhaler Inhale into the lungs every 4 (four) hours as needed for wheezing or shortness of breath.    [provider]  aspirin EC 81 MG tablet Take 1 tablet (81 mg total) by mouth daily. 05/20/16   End, Harrell Gave, MD  calcium acetate (PHOSLO) 667 MG capsule Take 1,334-2,001 mg by mouth 3 (three) times daily with meals. Take 2001 mgs with meals 3 times daily and 1334 with snacks 12/11/15   [provider]  Calcium Carbonate Antacid (TUMS ULTRA 1000 PO) Take 2 tablets by mouth 3 (three) times daily before meals.    [provider]  carvedilol (COREG) 6.25 MG tablet Take 1 tablet (6.25 mg total) by mouth 2 (two) times daily with a meal. 05/01/16   Bettey Costa, MD  cinacalcet (SENSIPAR) 90 MG tablet Take 90 mg by mouth daily.    [provider]  hydrALAZINE (APRESOLINE) 25 MG tablet Take 1 tablet by mouth 3 (three) times daily. 07/01/16   [provider]  ipratropium (ATROVENT HFA) 17 MCG/ACT inhaler Inhale 2 puffs into the lungs every 4 (four) hours as needed for wheezing.    [provider]  irbesartan (AVAPRO) 300 MG tablet Take 1 tablet by mouth daily. 07/01/16   [provider]  isosorbide mononitrate (IMDUR) 30 MG 24 hr tablet Take 1 tablet (30 mg total) by mouth daily. 05/20/16 08/18/16  End, Harrell Gave, MD  lidocaine-prilocaine (EMLA) cream Apply 1 application topically as needed (dialysis access).    [provider]  losartan (COZAAR) 25 MG tablet Take 1 tablet (25 mg total) by mouth daily. 06/17/16  09/15/16  End, Harrell Gave, MD     Allergies Sulfa antibiotics   Family History  Problem Relation Age of Onset  . Hypertension Son   . Diabetes Son   . Hypertension Mother   . Diabetes Mother   . Diabetes Sister     Social History Social History  Substance Use Topics  . Smoking status: Current Some Day Smoker    Packs/day: 0.25    Years: 40.00    Types: Cigarettes  . Smokeless tobacco: Never Used  . Alcohol use No    Review of Systems  Constitutional:   No fever or chills.  ENT:   No sore throat. No rhinorrhea. Lymphatic: No swollen glands, No extremity swelling Endocrine: No hot/cold flashes. No significant weight change. No neck swelling. Cardiovascular:   No chest pain or syncope. Respiratory:   Positive shortness of breath and nonproductive cough. Gastrointestinal:   Negative for abdominal pain, vomiting and diarrhea.  Genitourinary:   Negative for dysuria or difficulty urinating. Musculoskeletal:   Negative for focal pain or swelling Neurological:   Negative for headaches or weakness. All other systems reviewed and are negative  except as documented above in ROS and HPI.  ____________________________________________   PHYSICAL EXAM:  VITAL SIGNS: ED Triage Vitals  Enc Vitals Group     BP 07/04/16 1425 (!) 179/96     Pulse Rate 07/04/16 1425 74     Resp 07/04/16 1425 20     Temp 07/04/16 1425 97.6 F (36.4 C)     Temp Source 07/04/16 1425 Oral     SpO2 07/04/16 1415 97 %     Weight 07/04/16 1425 176 lb (79.8 kg)     Height 07/04/16 1425 5\' 7"  (1.702 m)     Head Circumference --      Peak Flow --      Pain Score 07/04/16 1425 8     Pain Loc --      Pain Edu? --      Excl. in [place]? --     Vital signs reviewed, nursing assessments reviewed.   Constitutional:   Alert and oriented. Well appearing and in no distress. Eyes:   No scleral icterus. No conjunctival pallor. PERRL. EOMI.  No nystagmus. ENT   Head:   Normocephalic and atraumatic.    Nose:   No congestion/rhinnorhea. No septal hematoma   Mouth/Throat:   MMM, no pharyngeal erythema. No peritonsillar mass.    Neck:   No stridor. No SubQ emphysema. No meningismus. Hematological/Lymphatic/Immunilogical:   No cervical lymphadenopathy. Cardiovascular:   RRR. Symmetric bilateral radial and DP pulses.  No murmurs.  Respiratory:   Normal respiratory effort without tachypnea nor retractions. Breath sounds are clear and equal bilaterally. No wheezes/rales/rhonchi. Gastrointestinal:   Soft and nontender. Non distended. There is no CVA tenderness.  No rebound, rigidity, or guarding. Genitourinary:   deferred Musculoskeletal:   Normal range of motion in all extremities. No joint effusions.  No lower extremity tenderness.  No edema. Neurologic:   Normal speech and language.  CN 2-10 normal. Motor grossly intact. No gross focal neurologic deficits are appreciated.  Skin:    Skin is warm, dry and intact. No rash noted.  No petechiae, purpura, or bullae.  ____________________________________________    LABS (pertinent positives/negatives) (all labs ordered are listed, but only abnormal results are displayed) Labs Reviewed  BASIC METABOLIC PANEL - Abnormal; Notable for the following:       Result Value   Potassium >7.5 (*)    Chloride 98 (*)    CO2 21 (*)    BUN 67 (*)    Creatinine, Ser 8.95 (*)    GFR calc non Af Amer 6 (*)    GFR calc Af Amer 6 (*)    Anion gap 17 (*)    All other components within normal limits  CBC WITH DIFFERENTIAL/PLATELET - Abnormal; Notable for the following:    RDW 17.3 (*)    All other components within normal limits  TROPONIN I - Abnormal; Notable for the following:    Troponin I 0.24 (*)    All other components within normal limits   ____________________________________________   EKG  Interpreted by me Sinus rhythm rate of 75, left axis, LVH with repolarization abnormality, slight lateral ST depression with lateral T-wave inversions,  chronic and unchanged compared to previous EKGs. No acute ischemic changes. There are slightly peaked T waves.  ____________________________________________    RADIOLOGY  Dg Chest 2 View  Result Date: 07/04/2016 CLINICAL DATA:  Shortness of breath. EXAM: CHEST  2 VIEW COMPARISON:  June 06, 2016 FINDINGS: Stable dialysis catheter. Stable cardiomegaly. The hila and mediastinum are  normal. No focal infiltrates identified. No overt edema. IMPRESSION: No active cardiopulmonary disease. Electronically Signed   By: Dorise Bullion III M.D   On: 07/04/2016 15:38   Ct Angio Chest Pe W Or Wo Contrast  Result Date: 07/04/2016 CLINICAL DATA:  61 year old male with acute shortness of breath. Patient with end-stage renal disease. EXAM: CT ANGIOGRAPHY CHEST WITH CONTRAST TECHNIQUE: Multidetector CT imaging of the chest was performed using the standard protocol during bolus administration of intravenous contrast. Multiplanar CT image reconstructions and MIPs were obtained to evaluate the vascular anatomy. CONTRAST:  75 cc intravenous Isovue 370 COMPARISON:  07/05/2015 and prior chest radiographs FINDINGS: Cardiovascular: Satisfactory opacification of the pulmonary arteries to the segmental level. No evidence of pulmonary embolism. Marked cardiomegaly and moderate to heavy coronary artery calcifications noted. The ascending thoracic aorta measures 4.1 cm and the descending thoracic aorta measures 3 cm in greatest diameter. There is no evidence of pericardial effusion. Mediastinum/Nodes: No enlarged mediastinal, hilar, or axillary lymph nodes. Thyroid gland, trachea, and esophagus demonstrate no significant findings. Lungs/Pleura: Lungs are clear. No pleural effusion or pneumothorax. Upper Abdomen: A small amount of ascites adjacent to the liver is noted. There is an equivocal slightly nodular hepatic contour were and cirrhosis is not excluded. Musculoskeletal: No acute bony abnormality or suspicious focal bony lesions.  Increased density of the visualized bony structures likely represents renal osteodystrophy. Review of the MIP images confirms the above findings. IMPRESSION: No evidence of pulmonary emboli. Ascending thoracic aortic aneurysm measuring 4.1 cm in greatest diameter. Recommend annual imaging followup by CTA or MRA. This recommendation follows 2010 ACCF/AHA/AATS/ACR/ASA/SCA/SCAI/SIR/STS/SVM Guidelines for the Diagnosis and Management of Patients with Thoracic Aortic Disease. Circulation. 2010; 121: U272-Z366 Marked cardiomegaly and coronary artery disease. Small amount of ascites within the upper abdomen. Equivocal slightly nodular hepatic contour -cirrhosis not excluded. Electronically Signed   By: Margarette Canada M.D.   On: 07/04/2016 16:44    ____________________________________________   PROCEDURES Procedures CRITICAL CARE Performed by: Joni Fears, Masao Junker   Total critical care time: 35 minutes  Critical care time was exclusive of separately billable procedures and treating other patients.  Critical care was necessary to treat or prevent imminent or life-threatening deterioration.  Critical care was time spent personally by me on the following activities: development of treatment plan with patient and/or surrogate as well as nursing, discussions with consultants, evaluation of patient's response to treatment, examination of patient, obtaining history from patient or surrogate, ordering and performing treatments and interventions, ordering and review of laboratory studies, ordering and review of radiographic studies, pulse oximetry and re-evaluation of patient's condition.  ____________________________________________   INITIAL IMPRESSION / ASSESSMENT AND PLAN / ED COURSE  Pertinent labs & imaging results that were available during my care of the patient were reviewed by me and considered in my medical decision making (see chart for details).  Patient presents with shortness of breath with clear  lungs. He has frequent presentations for similar symptoms, sometimes due to volume overload. Due to clinical uncertainty, and no history to support Y am overload, we'll get a CT angiogram of the chest to evaluate for PE. Low suspicion of ACS dissection or sepsis.     Clinical Course as of Jul 04 1748  Sun Jul 04, 2016  1723 CTA neg except 4cm ATA. No current pain complaints.   [PS]  1730 Critical hyperkalemia. Insulin, glucose, bicarb, calcium, albuterol, kayexalate, admit.  Potassium: (!!) >7.5 [PS]  [id number] Discussed with Dr. Candiss Norse who will arrange for emergent dialysis. Discussed  with Dr. Doy Hutching for admission  [PS]    Clinical Course User Index [PS] Carrie Mew, MD     ____________________________________________   FINAL CLINICAL IMPRESSION(S) / ED DIAGNOSES  Final diagnoses:  Dyspnea  Hyperkalemia  Elevated troponin      New Prescriptions   No medications on file     Portions of this note were generated with dragon dictation software. Dictation errors may occur despite best attempts at proofreading.    Carrie Mew, MD 07/04/16 1750

## 2016-07-04 NOTE — ED Notes (Signed)
Date and time results received: 07/04/16 now   Test: Troponin Critical Value: 0.24  Name of Provider Notified: Joni Fears  Orders Received? Or Actions Taken?: await

## 2016-07-04 NOTE — ED Notes (Signed)
Placed pt on O2 2LNC at this time per pt request.

## 2016-07-04 NOTE — ED Notes (Signed)
Notified pt of need for second IV, pt refused

## 2016-07-04 NOTE — ED Notes (Signed)
Pt is back from Radiology, requesting O2

## 2016-07-04 NOTE — ED Notes (Signed)
Patient transported to CT 

## 2016-07-04 NOTE — ED Notes (Signed)
Called radiology to inform them that pt is done with breathing treatment and they can get him if they would like.  Pt is alert and oriented, friend at the bedside.  No acute distress noted

## 2016-07-04 NOTE — ED Notes (Signed)
Started IV and drew and held labs

## 2016-07-04 NOTE — ED Notes (Addendum)
Date and time results received: 07/04/16 1726   Test: Potassium Critical Value: potassium greater than 7.61mmol/L  Name of Provider Notified: Joni Fears  Orders Received? Or Actions Taken?: await

## 2016-07-04 NOTE — ED Notes (Signed)
Pt is agitated and sitting at the edge of the bed.  He states that his daughter is coming to get him and he is leaving.  When asked why he states because "nothing is being done".  Advised him that I will send the labs that were previously drawn, that MD is reviewing X-ray and that a CT scan is ordered.  Apologized to pt that he is feeling this way and assured him that several things were ordered to get him answers.  Pt spo2 is 98% on RA.  Pt previsously called me in the room several times to apply O2, each time spo2 was 97-99% on RA.  Will continues to monitor pt and check for new orders. No visible respiratory distress

## 2016-07-04 NOTE — H&P (Signed)
History and Physical    [name] SRP:[id number] DOB: 1955/08/15 DOA: 07/04/2016  Referring physician: Dr. Joni [name] PCP: [facility] [place]  Specialists: none  Chief Complaint: SOB with cough  HPI: [name] is a 61 y.o. male has a past medical history significant for ESRD on HD, HTN, and chronic CHF now with 1-2 day hx of progressive SOB with dry cough. N/V x 1 prior to arrival. In ER, pt noted to have EKG changes with K+=7.5. Denies CP. BP elevated. He is now admitted. CT chest stable.  Review of Systems: The patient denies anorexia, fever, weight loss,, vision loss, decreased hearing, hoarseness, chest pain, syncope, peripheral edema, balance deficits, hemoptysis, abdominal pain, melena, hematochezia, severe indigestion/heartburn, hematuria, incontinence, genital sores, muscle weakness, suspicious skin lesions, transient blindness, difficulty walking, depression, unusual weight change, abnormal bleeding, enlarged lymph nodes, angioedema, and breast masses.   Past Medical History:  Diagnosis Date  . Chest pain    a. 09/2013 Myoview [facility]): basal inf defect more pronounced @ rest, likely artifact, EF 48%, low risk study.  . Chronic combined systolic and diastolic CHF (congestive heart failure) ([place])    a. 10/2013 Echo [facility]): EF 50%, diast dysfxn, triv AI/TR, mild MR;  b. 10/2014 Echo: EF 30-35%, diff HK, gr1 DD, mild AI, mild to mod MR/TR, mod dil LA, nl RV, mod to sev PAH;  c. 04/2016 Echo: EF 20-25%, sev dil LV, diff HK, gr3 DD, mod AI, MV syst bowing w/ prolapse, mod dil RV, mod TR.  [name] ESRD (end stage renal disease) ([place])    a. MWF dialysis @ [facility].  . Hypertension   . Hypertensive heart disease with CHF (congestive heart failure) ([place])   . Peripheral vascular disease ([place])   . Pulmonary hypertension ([place]) 04/2016  . Renal insufficiency    Past Surgical History:  Procedure Laterality Date  . A/V SHUNT INTERVENTION N/A 04/06/2016   Procedure: A/V  Shunt Intervention;  Surgeon: Katha Cabal, MD;  Location: [place] [facility];  Service: Cardiovascular;  Laterality: N/A;  . A/V SHUNT INTERVENTION N/A 06/29/2016   Procedure: A/V Shunt Intervention;  Surgeon: Katha Cabal, MD;  Location: [place] [facility];  Service: Cardiovascular;  Laterality: N/A;  . A/V SHUNTOGRAM Left 04/06/2016   Procedure: A/V Fistulagram;  Surgeon: Katha Cabal, MD;  Location: [place] [facility];  Service: Cardiovascular;  Laterality: Left;  . AV FISTULA PLACEMENT Left 2014  . PERIPHERAL VASCULAR CATHETERIZATION N/A 10/03/2014   Procedure: A/V Shuntogram/Fistulagram;  Surgeon: Algernon Huxley, MD;  Location: [place] [facility];  Service: Cardiovascular;  Laterality: N/A;  . PERIPHERAL VASCULAR CATHETERIZATION Left 10/03/2014   Procedure: A/V Shunt Intervention;  Surgeon: Algernon Huxley, MD;  Location: [place] [facility];  Service: Cardiovascular;  Laterality: Left;  . PERIPHERAL VASCULAR CATHETERIZATION Left 05/01/2015   Procedure: A/V Shuntogram/Fistulagram;  Surgeon: Algernon Huxley, MD;  Location: [place] [facility];  Service: Cardiovascular;  Laterality: Left;  . PERIPHERAL VASCULAR CATHETERIZATION N/A 05/01/2015   Procedure: A/V Shunt Intervention;  Surgeon: Algernon Huxley, MD;  Location: [place] [facility];  Service: Cardiovascular;  Laterality: N/A;  . UPPER EXTREMITY ANGIOGRAPHY Bilateral 06/29/2016   Procedure: Upper Extremity Angiography;  Surgeon: Katha Cabal, MD;  Location: [place] [facility];  Service: Cardiovascular;  Laterality: Bilateral;  . UPPER EXTREMITY INTERVENTION  06/29/2016   Procedure: Upper Extremity Intervention;  Surgeon: Katha Cabal, MD;  Location: [place] [facility];  Service:  Cardiovascular;;   Social History:  reports that he has been smoking Cigarettes.  He has a 10.00 pack-year smoking history. He has never used smokeless tobacco. He reports that he uses drugs, including Cocaine, about 1 time per week. He reports  that he does not drink alcohol.  Allergies  Allergen Reactions  . Sulfa Antibiotics Rash    Family History  Problem Relation Age of Onset  . Hypertension Son   . Diabetes Son   . Hypertension Mother   . Diabetes Mother   . Diabetes Sister     Prior to Admission medications   Medication Sig Start Date End Date Taking? Authorizing Provider  albuterol (PROVENTIL HFA;VENTOLIN HFA) 108 (90 Base) MCG/ACT inhaler Inhale into the lungs every 4 (four) hours as needed for wheezing or shortness of breath.    [provider]  aspirin EC 81 MG tablet Take 1 tablet (81 mg total) by mouth daily. 05/20/16   End, Harrell Gave, MD  calcium acetate (PHOSLO) 667 MG capsule Take 1,334-2,001 mg by mouth 3 (three) times daily with meals. Take 2001 mgs with meals 3 times daily and 1334 with snacks 12/11/15   [provider]  Calcium Carbonate Antacid (TUMS ULTRA 1000 PO) Take 2 tablets by mouth 3 (three) times daily before meals.    [provider]  carvedilol (COREG) 6.25 MG tablet Take 1 tablet (6.25 mg total) by mouth 2 (two) times daily with a meal. 05/01/16   Bettey Costa, MD  cinacalcet (SENSIPAR) 90 MG tablet Take 90 mg by mouth daily.    [provider]  hydrALAZINE (APRESOLINE) 25 MG tablet Take 1 tablet by mouth 3 (three) times daily. 07/01/16   [provider]  ipratropium (ATROVENT HFA) 17 MCG/ACT inhaler Inhale 2 puffs into the lungs every 4 (four) hours as needed for wheezing.    [provider]  irbesartan (AVAPRO) 300 MG tablet Take 1 tablet by mouth daily. 07/01/16   [provider]  isosorbide mononitrate (IMDUR) 30 MG 24 hr tablet Take 1 tablet (30 mg total) by mouth daily. 05/20/16 08/18/16  End, Harrell Gave, MD  lidocaine-prilocaine (EMLA) cream Apply 1 application topically as needed (dialysis access).    [provider]  losartan (COZAAR) 25 MG tablet Take 1 tablet (25 mg total) by mouth daily. 06/17/16 09/15/16  Nelva Bush, MD   Physical Exam: Vitals:   07/04/16 1607 07/04/16 1615 07/04/16 1634 07/04/16 1645  BP:      Pulse: 73 77 72 73  Resp: 16 18 20    Temp:      TempSrc:      SpO2: 98% 97% 94% 100%  Weight:      Height:         General:  No apparent distress, WDWN, [place]/AT  Eyes: PERRL, EOMI, no scleral icterus, conjunctiva clear  ENT: moist oropharynx without exudate, TM's benign, dentition fair  Neck: supple, no lymphadenopathy. No bruits or thyromegaly  Cardiovascular: regular rate without MRG; 2+ peripheral pulses, no JVD, no peripheral edema  Respiratory: CTA biL, good air movement without wheezing, rhonchi or crackled. Respiratory effort normal  Abdomen: soft, non tender to palpation, positive bowel sounds, no guarding, no rebound  Skin: no rashes or lesions  Musculoskeletal: normal bulk and tone, no joint swelling  Psychiatric: normal mood and affect, A&OX3  Neurologic: CN 2-12 grossly intact, Motor strength 5/5 in all 4 groups with symmetric DTR's and non-focal sensory exam  Labs on Admission:  Basic Metabolic Panel:  Recent Labs  Lab 07/04/16 1611  NA 136  K >7.5*  CL 98*  CO2 21*  GLUCOSE 68  BUN 67*  CREATININE 8.95*  CALCIUM 10.1   Liver Function Tests: No results for input(s): AST, ALT, ALKPHOS, BILITOT, PROT, ALBUMIN in the last 168 hours. No results for input(s): LIPASE, AMYLASE in the last 168 hours. No results for input(s): AMMONIA in the last 168 hours. CBC:  Recent Labs Lab 07/04/16 1611  WBC 6.9  NEUTROABS 5.0  HGB 13.9  HCT 42.6  MCV 93.9  PLT 194   Cardiac Enzymes:  Recent Labs Lab 07/04/16 1611  TROPONINI 0.24*    BNP (last 3 results)  Recent Labs  01/14/16 0657 04/29/16 1926 06/06/16 2125  BNP >4,500.0* >4,500.0* >4,500.0*    ProBNP (last 3 results) No results for input(s): PROBNP in the last 8760 hours.  CBG: No results for input(s): GLUCAP in the last 168 hours.  Radiological Exams on Admission: Dg Chest  2 View  Result Date: 07/04/2016 CLINICAL DATA:  Shortness of breath. EXAM: CHEST  2 VIEW COMPARISON:  June 06, 2016 FINDINGS: Stable dialysis catheter. Stable cardiomegaly. The hila and mediastinum are normal. No focal infiltrates identified. No overt edema. IMPRESSION: No active cardiopulmonary disease. Electronically Signed   By: Dorise Bullion III M.D   On: 07/04/2016 15:38   Ct Angio Chest Pe W Or Wo Contrast  Result Date: 07/04/2016 CLINICAL DATA:  61 year old male with acute shortness of breath. Patient with end-stage renal disease. EXAM: CT ANGIOGRAPHY CHEST WITH CONTRAST TECHNIQUE: Multidetector CT imaging of the chest was performed using the standard protocol during bolus administration of intravenous contrast. Multiplanar CT image reconstructions and MIPs were obtained to evaluate the vascular anatomy. CONTRAST:  75 cc intravenous Isovue 370 COMPARISON:  07/05/2015 and prior chest radiographs FINDINGS: Cardiovascular: Satisfactory opacification of the pulmonary arteries to the segmental level. No evidence of pulmonary embolism. Marked cardiomegaly and moderate to heavy coronary artery calcifications noted. The ascending thoracic aorta measures 4.1 cm and the descending thoracic aorta measures 3 cm in greatest diameter. There is no evidence of pericardial effusion. Mediastinum/Nodes: No enlarged mediastinal, hilar, or axillary lymph nodes. Thyroid gland, trachea, and esophagus demonstrate no significant findings. Lungs/Pleura: Lungs are clear. No pleural effusion or pneumothorax. Upper Abdomen: A small amount of ascites adjacent to the liver is noted. There is an equivocal slightly nodular hepatic contour were and cirrhosis is not excluded. Musculoskeletal: No acute bony abnormality or suspicious focal bony lesions. Increased density of the visualized bony structures likely represents renal osteodystrophy. Review of the MIP images confirms the above findings. IMPRESSION: No evidence of pulmonary  emboli. Ascending thoracic aortic aneurysm measuring 4.1 cm in greatest diameter. Recommend annual imaging followup by CTA or MRA. This recommendation follows 2010 ACCF/AHA/AATS/ACR/ASA/SCA/SCAI/SIR/STS/SVM Guidelines for the Diagnosis and Management of Patients with Thoracic Aortic Disease. Circulation. 2010; 121: Z610-R604 Marked cardiomegaly and coronary artery disease. Small amount of ascites within the upper abdomen. Equivocal slightly nodular hepatic contour -cirrhosis not excluded. Electronically Signed   By: Margarette Canada M.D.   On: 07/04/2016 16:44    EKG: Independently reviewed.  Assessment/Plan Principal Problem:   Hyperkalemia Active Problems:   Chronic combined systolic and diastolic heart failure (HCC)   ESRD (end stage renal disease) on dialysis (H[facility])   Abnormal EKG   Will admit to telemetry. Consult Nephrology for urgent HD. Treatment for hyperkalemia initiated in ER. Follow labs closely. Optimize BP and pulmonary regimens. Consult Cardiology given elevated tropon and follow enzymes. Repeat labs in  AM  Diet: clear liquids Fluids: NS@75  DVT Prophylaxis: SQ Heparin  Code Status: FULL  Family Communication: none  Disposition Plan: home  Time spent: 55 min

## 2016-07-04 NOTE — Progress Notes (Signed)
HD STARTED  

## 2016-07-04 NOTE — ED Triage Notes (Signed)
Pt to triage with c/o of SOB that got worse last nite. Pt MWF dialysis pt. Says that he had full treatment Friday. Bilateral ankles mildly swollen, decreased air movement bilaterally.

## 2016-07-04 NOTE — Progress Notes (Signed)
This note also relates to the following rows which could not be included: Pulse Rate - Cannot attach notes to unvalidated device data Resp - Cannot attach notes to unvalidated device data BP - Cannot attach notes to unvalidated device data  HD COMPLETED  

## 2016-07-04 NOTE — Progress Notes (Signed)
[facility], [place] 07/04/16  Subjective:   Patient known to our practice from outpatient dialysis Presents with subjective SOB Found to have normal sats on room air but felt better with oxygen supplementation Found to have critically elevated Potassium with Peaked T waves Urgent HD requested Patient transferred to ICU Seen during HD   HEMODIALYSIS FLOWSHEET:  Blood Flow Rate (mL/min): 350 mL/min Arterial Pressure (mmHg): -140 mmHg Venous Pressure (mmHg): 160 mmHg Transmembrane Pressure (mmHg): 60 mmHg Ultrafiltration Rate (mL/min): 860 mL/min Dialysate Flow Rate (mL/min): 800 ml/min Conductivity: Machine : 13.6 Conductivity: Machine : 13.6 Dialysis Fluid Bolus: Normal Saline Bolus Amount (mL): 250 mL     Objective:  Vital signs in last 24 hours:  Temp:  [97.6 F (36.4 C)-99.3 F (37.4 C)] 99.3 F (37.4 C) (05/13 1900) Pulse Rate:  [72-89] 82 (05/13 2115) Resp:  [16-31] 16 (05/13 2115) BP: (157-186)/(89-124) 167/114 (05/13 2100) SpO2:  [94 %-100 %] 97 % (05/13 2015) Weight:  [78.3 kg (172 lb 9.9 oz)-79.8 kg (176 lb)] 78.3 kg (172 lb 9.9 oz) (05/13 2015)  Weight change:  Filed Weights   07/04/16 1425 07/04/16 1900 07/04/16 2015  Weight: 79.8 kg (176 lb) 78.4 kg (172 lb 13.5 oz) 78.3 kg (172 lb 9.9 oz)    Intake/Output:    Intake/Output Summary (Last 24 hours) at 07/04/16 2128 Last data filed at 07/04/16 1849  Gross per 24 hour  Intake               50 ml  Output                0 ml  Net               50 ml     Physical Exam: General: NAD, laying in bed  HEENT Anicteric, moist oral mucus membranes  Neck supple  Pulm/lungs Clear to auscltation, room air  CVS/Heart Regular, tachycardic  Abdomen:  Soft, NT  Extremities: No edema  Neurologic: Alert, oreinted  Skin: No acute rashes  Access: Rt IJ PC       Basic Metabolic Panel:   Recent Labs Lab 07/04/16 1611  NA 136  K >7.5*  CL 98*  CO2 21*  GLUCOSE 68  BUN  67*  CREATININE 8.95*  CALCIUM 10.1     CBC:  Recent Labs Lab 07/04/16 1611  WBC 6.9  NEUTROABS 5.0  HGB 13.9  HCT 42.6  MCV 93.9  PLT 194     Lab Results  Component Value Date   HEPBSAG Negative 06/07/2016   HEPBSAB Reactive 06/07/2016      Microbiology:  No results found for this or any previous visit (from the past 240 hour(s)).  Coagulation Studies: No results for input(s): LABPROT, INR in the last 72 hours.  Urinalysis: No results for input(s): COLORURINE, LABSPEC, PHURINE, GLUCOSEU, HGBUR, BILIRUBINUR, KETONESUR, PROTEINUR, UROBILINOGEN, NITRITE, LEUKOCYTESUR in the last 72 hours.  Invalid input(s): APPERANCEUR    Imaging: Dg Chest 2 View  Result Date: 07/04/2016 CLINICAL DATA:  Shortness of breath. EXAM: CHEST  2 VIEW COMPARISON:  June 06, 2016 FINDINGS: Stable dialysis catheter. Stable cardiomegaly. The hila and mediastinum are normal. No focal infiltrates identified. No overt edema. IMPRESSION: No active cardiopulmonary disease. Electronically Signed   By: Dorise Bullion III M.D   On: 07/04/2016 15:38   Ct Angio Chest Pe W Or Wo Contrast  Result Date: 07/04/2016 CLINICAL DATA:  61 year old male with acute shortness of breath. Patient with end-stage renal  disease. EXAM: CT ANGIOGRAPHY CHEST WITH CONTRAST TECHNIQUE: Multidetector CT imaging of the chest was performed using the standard protocol during bolus administration of intravenous contrast. Multiplanar CT image reconstructions and MIPs were obtained to evaluate the vascular anatomy. CONTRAST:  75 cc intravenous Isovue 370 COMPARISON:  07/05/2015 and prior chest radiographs FINDINGS: Cardiovascular: Satisfactory opacification of the pulmonary arteries to the segmental level. No evidence of pulmonary embolism. Marked cardiomegaly and moderate to heavy coronary artery calcifications noted. The ascending thoracic aorta measures 4.1 cm and the descending thoracic aorta measures 3 cm in greatest diameter. There  is no evidence of pericardial effusion. Mediastinum/Nodes: No enlarged mediastinal, hilar, or axillary lymph nodes. Thyroid gland, trachea, and esophagus demonstrate no significant findings. Lungs/Pleura: Lungs are clear. No pleural effusion or pneumothorax. Upper Abdomen: A small amount of ascites adjacent to the liver is noted. There is an equivocal slightly nodular hepatic contour were and cirrhosis is not excluded. Musculoskeletal: No acute bony abnormality or suspicious focal bony lesions. Increased density of the visualized bony structures likely represents renal osteodystrophy. Review of the MIP images confirms the above findings. IMPRESSION: No evidence of pulmonary emboli. Ascending thoracic aortic aneurysm measuring 4.1 cm in greatest diameter. Recommend annual imaging followup by CTA or MRA. This recommendation follows 2010 ACCF/AHA/AATS/ACR/ASA/SCA/SCAI/SIR/STS/SVM Guidelines for the Diagnosis and Management of Patients with Thoracic Aortic Disease. Circulation. 2010; 121: D924-Q683 Marked cardiomegaly and coronary artery disease. Small amount of ascites within the upper abdomen. Equivocal slightly nodular hepatic contour -cirrhosis not excluded. Electronically Signed   By: Margarette Canada M.D.   On: 07/04/2016 16:44     Medications:   . sodium chloride    . sodium chloride    . famotidine (PEPCID) IV Stopped (07/04/16 1841)   . aspirin EC  81 mg Oral Daily  . [START ON 07/05/2016] calcium acetate  2,001 mg Oral TID WC   And  . calcium acetate  1,334 mg Oral With snacks  . [START ON 07/05/2016] carvedilol  6.25 mg Oral BID WC  . [START ON 07/05/2016] cinacalcet  90 mg Oral Q breakfast  . docusate sodium  100 mg Oral BID  . heparin  5,000 Units Subcutaneous Q8H  . hydrALAZINE  50 mg Oral TID  . ipratropium-albuterol  3 mL Nebulization QID  . losartan  50 mg Oral Daily  . nitroGLYCERIN  1 inch Topical Q6H   sodium chloride, sodium chloride, acetaminophen **OR** acetaminophen, albuterol,  alteplase, bisacodyl, heparin, hydrALAZINE, lidocaine (PF), lidocaine-prilocaine, ondansetron **OR** ondansetron (ZOFRAN) IV, pentafluoroprop-tetrafluoroeth  Assessment/ Plan:  61 y.o. male hypertension, hepatitis C, ESRD started HD 10/13, anemia of CKD, admission for diarrhea/hyperkalemia 10/2012, admission for GI bleed 10/2012, SHPTH, GI bleed 3/15, seizure episode,  admission for Hyperkalemia 07/04/16  1.  End-stage renal disease [facility], [place], [place]; MWF; [facility] ED[facility] 73.5 kg Present weight 78.5.  Goal UF 3-4 kg as tolerated  2. AOCKD Hgb 13.9 Monitor No EPO  3. SHPTH Low phos/renal diet Continue home dose of binders  4. Chronic SOB Multifactorial from Chronic systolic CHF EF 41-96 %, fluid overload and h/o Cocaine abuse  5. Severe hyperkalemia - discussed low diet - Fluid restriction to 1200 cc/d - Urgent HD with patient in ICU Start with 1 K bath Check Potassium at t=2 hrs; if < 5 change to 2 K bath    LOS: 0 [facility] 5/13/20189:28 PM  [facility] [place], [place]

## 2016-07-05 LAB — COMPREHENSIVE METABOLIC PANEL
ALBUMIN: 3.2 g/dL — AB (ref 3.5–5.0)
ALT: 21 U/L (ref 17–63)
AST: 35 U/L (ref 15–41)
Alkaline Phosphatase: 51 U/L (ref 38–126)
Anion gap: 13 (ref 5–15)
BUN: 32 mg/dL — AB (ref 6–20)
CHLORIDE: 99 mmol/L — AB (ref 101–111)
CO2: 27 mmol/L (ref 22–32)
Calcium: 8.7 mg/dL — ABNORMAL LOW (ref 8.9–10.3)
Creatinine, Ser: 5.38 mg/dL — ABNORMAL HIGH (ref 0.61–1.24)
GFR calc Af Amer: 12 mL/min — ABNORMAL LOW (ref >60)
GFR, EST NON AFRICAN AMERICAN: 10 mL/min — AB (ref >60)
Glucose, Bld: 108 mg/dL — ABNORMAL HIGH (ref 65–99)
POTASSIUM: 3.6 mmol/L (ref 3.5–5.1)
SODIUM: 139 mmol/L (ref 135–145)
Total Bilirubin: 0.7 mg/dL (ref 0.3–1.2)
Total Protein: 6.7 g/dL (ref 6.5–8.1)

## 2016-07-05 LAB — CBC
HEMATOCRIT: 34.7 % — AB (ref 40.0–52.0)
Hemoglobin: 11.5 g/dL — ABNORMAL LOW (ref 13.0–18.0)
MCH: 30.7 pg (ref 26.0–34.0)
MCHC: 33 g/dL (ref 32.0–36.0)
MCV: 93 fL (ref 80.0–100.0)
Platelets: 158 10*3/uL (ref 150–440)
RBC: 3.73 MIL/uL — ABNORMAL LOW (ref 4.40–5.90)
RDW: 17 % — AB (ref 11.5–14.5)
WBC: 5.6 10*3/uL (ref 3.8–10.6)

## 2016-07-05 LAB — TROPONIN I
Troponin I: 0.22 ng/mL (ref <0.03)
Troponin I: 0.24 ng/mL (ref <0.03)

## 2016-07-05 LAB — PHOSPHORUS: Phosphorus: 5.4 mg/dL — ABNORMAL HIGH (ref 2.5–4.6)

## 2016-07-05 NOTE — Telephone Encounter (Signed)
Have not received labs. Called [facility] and spoke to [place]. She looked up lab work and said they only drew potassium, phosphorus and hemoglobin on 06/28/16. I said I had confirmed receipt of order for BMP and was told it would be drawn with labs on 06/28/16. She was very apologetic that this was not done. I asked her to fax the lab work that she does have and gave her our fax number. She again apologized for this not being done.

## 2016-07-05 NOTE — Progress Notes (Signed)
Patient is alert and able to make needs known. Denies pain. Transfers with assist of one. Uses BSC. Has had 3 liquid BMs since arriving to the unit r/t medication given in the ED. Patient refused to have a second IV placed for the both ED RN and myself. Patient also refused the pink foam and TED hose. Patient was compliant with heparin and other medications. Completed dialysis and then medications were given to patient. Compliant with renal diet and fluid restriction up to this point. Dialysis lasted 3 1/2 hours and 3.5 L was removed. Patient states he is "feeling great".

## 2016-07-05 NOTE — Care Management Important Message (Signed)
Important Message  Patient Details  Name: [name] MRN: [id number] Date of Birth: May 03, 1955   Medicare Important Message Given:  N/A - LOS <3 / Initial given by admissions    Katrina Stack, RN 07/05/2016, 11:31 AM

## 2016-07-05 NOTE — Progress Notes (Signed)
PULMONARY / CRITICAL CARE MEDICINE   Name: [name] MRN: [id number] DOB: [birth year]    ADMISSION DATE:  07/04/2016   CONSULTATION DATE:  07/04/2016  REFERRING MD:  Dr. Doy Hutching  REASON: Need for emergent hemodialysis  CHIEF COMPLAINT: acute dyspnea   HISTORY OF PRESENT ILLNESS:   61 Y/O male with ESRD ON hd M-W-F, chronic systolic and diastolic heart failure, PVD, and hypertension presenting with acute dyspnea. Patient had HD on Friday but went out for dinner on Saturday and had collaid and sweet tea. States that he started started feeling dyspneic at night and his symptoms progressively got worse hence he decided to come to the ED. AT the ED his K+ was >7. He is being admitted to the ICU for emergent HD Neurological: Negative for dizziness and headaches   SUBJECTIVE:  Reports feeling better post-HD. No acute issues overnight  VITAL SIGNS: BP 138/82   Pulse 76   Temp 98.7 F (37.1 C) (Oral)   Resp 13   Ht 5\' 8"  (1.727 m)   Wt 162 lb 11.2 oz (73.8 kg)   SpO2 95%   BMI 24.74 kg/m   HEMODYNAMICS:    VENTILATOR SETTINGS:    INTAKE / OUTPUT: I/O last 3 completed shifts: In: 37 [IV Piggyback:50] Out: -   PHYSICAL EXAMINATION: General: NAD, pleasant Neuro:  AAO X 4, no deficits HEENT: PERRLA Cardiovascular:  RRR, S1/S2, no MRG Lungs:  Normal WOB, + breath sounds, +crackles, diminished in the bases Abdomen: non-distended, soft, + BS x 4 Musculoskeletal:  No deformities Skin:  Warm and dry  LABS:  BMET  Recent Labs Lab 07/04/16 1611 07/04/16 2207 07/05/16 0204  NA 136  --  139  K >7.5* 3.6 3.6  CL 98*  --  99*  CO2 21*  --  27  BUN 67*  --  32*  CREATININE 8.95*  --  5.38*  GLUCOSE 68  --  108*    Electrolytes  Recent Labs Lab 07/04/16 1611 07/05/16 0204  CALCIUM 10.1 8.7*    CBC  Recent Labs Lab 07/04/16 1611 07/05/16 0204  WBC 6.9 5.6  HGB 13.9 11.5*  HCT 42.6 34.7*  PLT 194 158    Coag's No results for input(s): APTT, INR in the  last 168 hours.  Sepsis Markers No results for input(s): LATICACIDVEN, PROCALCITON, O2SATVEN in the last 168 hours.  ABG No results for input(s): PHART, PCO2ART, PO2ART in the last 168 hours.  Liver Enzymes  Recent Labs Lab 07/05/16 0204  AST 35  ALT 21  ALKPHOS 51  BILITOT 0.7  ALBUMIN 3.2*    Cardiac Enzymes  Recent Labs Lab 07/04/16 1611 07/04/16 2018 07/05/16 0204  TROPONINI 0.24* 0.20* 0.22*    Glucose  Recent Labs Lab 07/04/16 1757 07/04/16 1930  GLUCAP 155* 87    Imaging Dg Chest 2 View  Result Date: 07/04/2016 CLINICAL DATA:  Shortness of breath. EXAM: CHEST  2 VIEW COMPARISON:  June 06, 2016 FINDINGS: Stable dialysis catheter. Stable cardiomegaly. The hila and mediastinum are normal. No focal infiltrates identified. No overt edema. IMPRESSION: No active cardiopulmonary disease. Electronically Signed   By: Dorise Bullion III M.D   On: 07/04/2016 15:38   Ct Angio Chest Pe W Or Wo Contrast  Result Date: 07/04/2016 CLINICAL DATA:  61 year old male with acute shortness of breath. Patient with end-stage renal disease. EXAM: CT ANGIOGRAPHY CHEST WITH CONTRAST TECHNIQUE: Multidetector CT imaging of the chest was performed using the standard protocol during bolus administration of intravenous  contrast. Multiplanar CT image reconstructions and MIPs were obtained to evaluate the vascular anatomy. CONTRAST:  75 cc intravenous Isovue 370 COMPARISON:  07/05/2015 and prior chest radiographs FINDINGS: Cardiovascular: Satisfactory opacification of the pulmonary arteries to the segmental level. No evidence of pulmonary embolism. Marked cardiomegaly and moderate to heavy coronary artery calcifications noted. The ascending thoracic aorta measures 4.1 cm and the descending thoracic aorta measures 3 cm in greatest diameter. There is no evidence of pericardial effusion. Mediastinum/Nodes: No enlarged mediastinal, hilar, or axillary lymph nodes. Thyroid gland, trachea, and esophagus  demonstrate no significant findings. Lungs/Pleura: Lungs are clear. No pleural effusion or pneumothorax. Upper Abdomen: A small amount of ascites adjacent to the liver is noted. There is an equivocal slightly nodular hepatic contour were and cirrhosis is not excluded. Musculoskeletal: No acute bony abnormality or suspicious focal bony lesions. Increased density of the visualized bony structures likely represents renal osteodystrophy. Review of the MIP images confirms the above findings. IMPRESSION: No evidence of pulmonary emboli. Ascending thoracic aortic aneurysm measuring 4.1 cm in greatest diameter. Recommend annual imaging followup by CTA or MRA. This recommendation follows 2010 ACCF/AHA/AATS/ACR/ASA/SCA/SCAI/SIR/STS/SVM Guidelines for the Diagnosis and Management of Patients with Thoracic Aortic Disease. Circulation. 2010; 121: X323-F573 Marked cardiomegaly and coronary artery disease. Small amount of ascites within the upper abdomen. Equivocal slightly nodular hepatic contour -cirrhosis not excluded. Electronically Signed   By: Margarette Canada M.D.   On: 07/04/2016 16:44   LINES/TUBES: Right Subsclavian HD catheter  DISCUSSION: 61 y/o male  With ESRD AND COMBINED s&dCHF presenting with hyperkalemia and volume overload 2/2 dietary non-adherence  ASSESSMENT  Severe hyperkalemia Acute CHF exacerbation Elevated troponin   PLAN Emergent HD in progress Monitor and correct electrolytes Cycle troponin-trending down Nephrology following Dietary modification teaching provided  FAMILY  - Updates: Patient updated on treatment plan  - Inter-disciplinary family meet or Palliative Care meeting due by:  day 7  Plan of care discussed with Elink MD  Stable for transfer out of the ICU  [facility] ANP-BC Pulmonary and Critical Care Medicine [facility] Pager [phone] or [phone] 07/05/2016, 6:44 AM   Merton Border, MD PCCM service Mobile [phone] Pager  [phone] 07/06/2016 3:38 PM

## 2016-07-05 NOTE — Care Management Note (Signed)
Case Management Note  Patient Details  Name: [name] MRN: [id number] Date of Birth: [birth year]  Subjective/Objective:                 For discharge today.  ESRD known to [employer].   Action/Plan:   Notified Elvera Bicker with [facility]   Expected Discharge Date:  07/05/16               Expected Discharge Plan:     In-House Referral:     Discharge planning Services     Post Acute Care Choice:    Choice offered to:     DME Arranged:    DME Agency:     HH Arranged:    HH Agency:     Status of Service:     If discussed at [employer] of Stay Meetings, dates discussed:    Additional Comments:  Katrina Stack, RN 07/05/2016, 9:43 AM

## 2016-07-05 NOTE — Discharge Instructions (Signed)
Heart Failure Clinic appointment on Jul 08, 2016 at 12:00pm with Darylene Price, [place]. Please call [phone] to reschedule.

## 2016-07-05 NOTE — Progress Notes (Signed)
POST DIALYSIS ASSESSMENT 

## 2016-07-05 NOTE — Telephone Encounter (Signed)
Spoke with [place]. She checked and verified that labs were done on 06/28/16 and will fax them to me now.

## 2016-07-05 NOTE — Progress Notes (Signed)
[facility], [place] 07/05/16  Subjective:  Patient came in with severe hyperkalemia. Serum potassium came down to 3.6 today. Patient due for another dialysis session today. Shortness of breath also significantly improved.   Objective:  Vital signs in last 24 hours:  Temp:  [97.6 F (36.4 C)-99.3 F (37.4 C)] (P) 98 F (36.7 C) (05/14 1018) Pulse Rate:  [69-89] 76 (05/14 0900) Resp:  [12-31] 16 (05/14 0900) BP: (118-186)/(62-124) (P) 133/91 (05/14 1018) SpO2:  [92 %-100 %] 93 % (05/14 0900) Weight:  [72.4 kg (159 lb 9.8 oz)-79.8 kg (176 lb)] (P) 74.7 kg (164 lb 10.9 oz) (05/14 1018)  Weight change:  Filed Weights   07/04/16 2345 07/05/16 0500 07/05/16 1018  Weight: 72.4 kg (159 lb 9.8 oz) 73.8 kg (162 lb 11.2 oz) (P) 74.7 kg (164 lb 10.9 oz)    Intake/Output:    Intake/Output Summary (Last 24 hours) at 07/05/16 1022 Last data filed at 07/05/16 0555  Gross per 24 hour  Intake              150 ml  Output             3500 ml  Net            -3350 ml     Physical Exam: General: NAD, laying in bed  HEENT Anicteric, moist oral mucus membranes  Neck supple  Pulm/lungs Clear to auscultation, normal effort  CVS/Heart Regular, no rubs  Abdomen:  Soft, NT  Extremities: No edema  Neurologic: Alert, oriented x 3, follows commands  Skin: No acute rashes  Access: Rt IJ PC       Basic Metabolic Panel:   Recent Labs Lab 07/04/16 1611 07/04/16 2207 07/05/16 0204  NA 136  --  139  K >7.5* 3.6 3.6  CL 98*  --  99*  CO2 21*  --  27  GLUCOSE 68  --  108*  BUN 67*  --  32*  CREATININE 8.95*  --  5.38*  CALCIUM 10.1  --  8.7*     CBC:  Recent Labs Lab 07/04/16 1611 07/05/16 0204  WBC 6.9 5.6  NEUTROABS 5.0  --   HGB 13.9 11.5*  HCT 42.6 34.7*  MCV 93.9 93.0  PLT 194 158      Lab Results  Component Value Date   HEPBSAG Negative 06/07/2016   HEPBSAB Reactive 06/07/2016      Microbiology:  No results found for this or  any previous visit (from the past 240 hour(s)).  Coagulation Studies: No results for input(s): LABPROT, INR in the last 72 hours.  Urinalysis: No results for input(s): COLORURINE, LABSPEC, PHURINE, GLUCOSEU, HGBUR, BILIRUBINUR, KETONESUR, PROTEINUR, UROBILINOGEN, NITRITE, LEUKOCYTESUR in the last 72 hours.  Invalid input(s): APPERANCEUR    Imaging: Dg Chest 2 View  Result Date: 07/04/2016 CLINICAL DATA:  Shortness of breath. EXAM: CHEST  2 VIEW COMPARISON:  June 06, 2016 FINDINGS: Stable dialysis catheter. Stable cardiomegaly. The hila and mediastinum are normal. No focal infiltrates identified. No overt edema. IMPRESSION: No active cardiopulmonary disease. Electronically Signed   By: Dorise Bullion III M.D   On: 07/04/2016 15:38   Ct Angio Chest Pe W Or Wo Contrast  Result Date: 07/04/2016 CLINICAL DATA:  61 year old male with acute shortness of breath. Patient with end-stage renal disease. EXAM: CT ANGIOGRAPHY CHEST WITH CONTRAST TECHNIQUE: Multidetector CT imaging of the chest was performed using the standard protocol during bolus administration of intravenous contrast. Multiplanar CT image  reconstructions and MIPs were obtained to evaluate the vascular anatomy. CONTRAST:  75 cc intravenous Isovue 370 COMPARISON:  07/05/2015 and prior chest radiographs FINDINGS: Cardiovascular: Satisfactory opacification of the pulmonary arteries to the segmental level. No evidence of pulmonary embolism. Marked cardiomegaly and moderate to heavy coronary artery calcifications noted. The ascending thoracic aorta measures 4.1 cm and the descending thoracic aorta measures 3 cm in greatest diameter. There is no evidence of pericardial effusion. Mediastinum/Nodes: No enlarged mediastinal, hilar, or axillary lymph nodes. Thyroid gland, trachea, and esophagus demonstrate no significant findings. Lungs/Pleura: Lungs are clear. No pleural effusion or pneumothorax. Upper Abdomen: A small amount of ascites adjacent to  the liver is noted. There is an equivocal slightly nodular hepatic contour were and cirrhosis is not excluded. Musculoskeletal: No acute bony abnormality or suspicious focal bony lesions. Increased density of the visualized bony structures likely represents renal osteodystrophy. Review of the MIP images confirms the above findings. IMPRESSION: No evidence of pulmonary emboli. Ascending thoracic aortic aneurysm measuring 4.1 cm in greatest diameter. Recommend annual imaging followup by CTA or MRA. This recommendation follows 2010 ACCF/AHA/AATS/ACR/ASA/SCA/SCAI/SIR/STS/SVM Guidelines for the Diagnosis and Management of Patients with Thoracic Aortic Disease. Circulation. 2010; 121: D149-F026 Marked cardiomegaly and coronary artery disease. Small amount of ascites within the upper abdomen. Equivocal slightly nodular hepatic contour -cirrhosis not excluded. Electronically Signed   By: Margarette Canada M.D.   On: 07/04/2016 16:44     Medications:   . sodium chloride    . sodium chloride    . famotidine (PEPCID) IV Stopped (07/04/16 1841)   . aspirin EC  81 mg Oral Daily  . calcium acetate  2,001 mg Oral TID WC   And  . calcium acetate  1,334 mg Oral With snacks  . carvedilol  6.25 mg Oral BID WC  . cinacalcet  90 mg Oral Q breakfast  . docusate sodium  100 mg Oral BID  . heparin  5,000 Units Subcutaneous Q8H  . hydrALAZINE  50 mg Oral TID  . losartan  50 mg Oral Daily  . nitroGLYCERIN  1 inch Topical Q6H   sodium chloride, sodium chloride, acetaminophen **OR** acetaminophen, albuterol, alteplase, bisacodyl, heparin, hydrALAZINE, lidocaine (PF), lidocaine-prilocaine, ondansetron **OR** ondansetron (ZOFRAN) IV, pentafluoroprop-tetrafluoroeth  Assessment/ Plan:  61 y.o. male hypertension, hepatitis C, ESRD started HD 10/13, anemia of CKD, admission for diarrhea/hyperkalemia 10/2012, admission for GI bleed 10/2012, SHPTH, GI bleed 3/15, seizure episode,  admission for Hyperkalemia 07/04/16  1.  End-stage  renal disease [facility], [place], [place]; MWF; [facility] ED[facility] 73.5 kg -  Patient completed hemodialysis yesterday. We will plan for dialysis again today as he is due for dialysis per his usual schedule.  2. AOCKD Hemoglobin currently down to 11.5. No indication for Procrit at the moment.  3. SHPTH -  follow-up serum phosphorus today.  4. Chronic SOB Multifactorial from Chronic systolic CHF EF 37-85 %, fluid overload and h/o Cocaine abuse  5. Severe hyperkalemia - Patient dialyzed against a 1 potassium bath yesterday. Potassium down to 3.6. We will plan to perform dialysis using a 2K bath today.    LOS: 1 [name] 5/14/201810:22 [place], [place]

## 2016-07-05 NOTE — Discharge Summary (Signed)
[facility] NAME: [name]    MR#:  [id number]  DATE OF BIRTH:  June 04, 1955  DATE OF ADMISSION:  07/04/2016 ADMITTING PHYSICIAN: Idelle Crouch, MD  DATE OF DISCHARGE: 07/05/2016  PRIMARY CARE PHYSICIAN: [facility] [place]    ADMISSION DIAGNOSIS:  Hyperkalemia [E87.5] Dyspnea [R06.00] Elevated troponin [R74.8]  DISCHARGE DIAGNOSIS:  Principal Problem:   Hyperkalemia Active Problems:   Chronic combined systolic and diastolic heart failure (HCC)   ESRD (end stage renal disease) on dialysis ([place])   Abnormal EKG   SECONDARY DIAGNOSIS:   Past Medical History:  Diagnosis Date  . Chest pain    a. 09/2013 Myoview [facility]): basal inf defect more pronounced @ rest, likely artifact, EF 48%, low risk study.  . Chronic combined systolic and diastolic CHF (congestive heart failure) ([place])    a. 10/2013 Echo [facility]): EF 50%, diast dysfxn, triv AI/TR, mild MR;  b. 10/2014 Echo: EF 30-35%, diff HK, gr1 DD, mild AI, mild to mod MR/TR, mod dil LA, nl RV, mod to sev PAH;  c. 04/2016 Echo: EF 20-25%, sev dil LV, diff HK, gr3 DD, mod AI, MV syst bowing w/ prolapse, mod dil RV, mod TR.  [name] ESRD (end stage renal disease) ([place])    a. MWF dialysis @ [facility].  . Hypertension   . Hypertensive heart disease with CHF (congestive heart failure) ([place])   . Peripheral vascular disease ([place])   . Pulmonary hypertension ([place]) 04/2016  . Renal insufficiency     HOSPITAL COURSE:   61 year old male with ESRD on hemodialysis, combined systolic and diastolic heart failure EF of 20-25% and hypertension who presented with shortness of breath and found to have hyperkalemia.  1. Severe hyperkalemia: Patient had emergent dialysis due to elevated potassium. Potassium is now improved.  2. Acute on chronic combined systolic and diastolic heart failure with ejection fraction 20-25%: Patient underwent dialysis and symptoms of no improvement.  Patient will be referred to CHF clinic.  3. Essential hypertension: Patient will continue Coreg, hydralazine, Avapro, Imdur  4. ESRD: Patient will continue on dialysis Monday, Wednesday and Friday. He received full treatment on the day of admission and neck dialysis session will be on one is a. Plan discussed with nephrologist.   5. Elevated troponin: This is purely due to demand ischemia with poor renal clearance and not ACS.  non-ST elevation MI has been ruled out. I will schedule follow-up appointment with Dr.End.  DISCHARGE CONDITIONS AND DIET:   Stable for discharge on renal/heart healthy diet 1200 cc fluid restriction  CONSULTS OBTAINED:  Treatment Team:  Minna Merritts, MD Murlean Iba, MD  DRUG ALLERGIES:   Allergies  Allergen Reactions  . Sulfa Antibiotics Rash    DISCHARGE MEDICATIONS:   Current Discharge Medication List    CONTINUE these medications which have NOT CHANGED   Details  albuterol (PROVENTIL HFA;VENTOLIN HFA) 108 (90 Base) MCG/ACT inhaler Inhale into the lungs every 4 (four) hours as needed for wheezing or shortness of breath.    aspirin EC 81 MG tablet Take 1 tablet (81 mg total) by mouth daily. Qty: 90 tablet, Refills: 3    calcium acetate (PHOSLO) 667 MG capsule Take 1,334-2,001 mg by mouth 3 (three) times daily with meals. Take 2001 mgs with meals 3 times daily and 1334 with snacks    Calcium Carbonate Antacid (TUMS ULTRA 1000 PO) Take 2 tablets by mouth 3 (three) times daily before meals.  carvedilol (COREG) 6.25 MG tablet Take 1 tablet (6.25 mg total) by mouth 2 (two) times daily with a meal. Qty: 60 tablet, Refills: 0    cinacalcet (SENSIPAR) 90 MG tablet Take 90 mg by mouth daily.    hydrALAZINE (APRESOLINE) 50 MG tablet Take 1 tablet by mouth 3 (three) times daily.    ipratropium (ATROVENT HFA) 17 MCG/ACT inhaler Inhale 2 puffs into the lungs every 4 (four) hours as needed for wheezing.    isosorbide mononitrate (IMDUR) 30 MG  24 hr tablet Take 1 tablet (30 mg total) by mouth daily. Qty: 90 tablet, Refills: 3    lidocaine-prilocaine (EMLA) cream Apply 1 application topically as needed (dialysis access).    irbesartan (AVAPRO) 300 MG tablet Take 1 tablet by mouth daily.      STOP taking these medications     losartan (COZAAR) 25 MG tablet           Today   CHIEF COMPLAINT:   No acute events overnight. Patient feels much better. Denies shortness of breath.   VITAL SIGNS:  Blood pressure 138/82, pulse 76, temperature 98.7 F (37.1 C), temperature source Oral, resp. rate 13, height 5\' 8"  (1.727 m), weight 73.8 kg (162 lb 11.2 oz), SpO2 95 %.   REVIEW OF SYSTEMS:  Review of Systems  Constitutional: Negative.  Negative for chills, fever and malaise/fatigue.  HENT: Negative.  Negative for ear discharge, ear pain, hearing loss, nosebleeds and sore throat.   Eyes: Negative.  Negative for blurred vision and pain.  Respiratory: Negative.  Negative for cough, hemoptysis, shortness of breath and wheezing.   Cardiovascular: Negative.  Negative for chest pain, palpitations and leg swelling.  Gastrointestinal: Negative.  Negative for abdominal pain, blood in stool, diarrhea, nausea and vomiting.  Genitourinary: Negative.  Negative for dysuria.  Musculoskeletal: Negative.  Negative for back pain.  Skin: Negative.   Neurological: Negative for dizziness, tremors, speech change, focal weakness, seizures and headaches.  Endo/Heme/Allergies: Negative.  Does not bruise/bleed easily.  Psychiatric/Behavioral: Negative.  Negative for depression, hallucinations and suicidal ideas.     PHYSICAL EXAMINATION:  GENERAL:  61 y.o.-year-old patient lying in the bed with no acute distress.  NECK:  Supple, no jugular venous distention. No thyroid enlargement, no tenderness.  LUNGS: Normal breath sounds bilaterally, no wheezing, rales,rhonchi  No use of accessory muscles of respiration.  CARDIOVASCULAR: S1, S2 normal. No  murmurs, rubs, or gallops.  ABDOMEN: Soft, non-tender, non-distended. Bowel sounds present. No organomegaly or mass.  EXTREMITIES: No pedal edema, cyanosis, or clubbing. Left arm fistula Temp cath on chest wall PSYCHIATRIC: The patient is alert and oriented x 3.  SKIN: No obvious rash, lesion, or ulcer.   DATA REVIEW:   CBC  Recent Labs Lab 07/05/16 0204  WBC 5.6  HGB 11.5*  HCT 34.7*  PLT 158    Chemistries   Recent Labs Lab 07/05/16 0204  NA 139  K 3.6  CL 99*  CO2 27  GLUCOSE 108*  BUN 32*  CREATININE 5.38*  CALCIUM 8.7*  AST 35  ALT 21  ALKPHOS 51  BILITOT 0.7    Cardiac Enzymes  Recent Labs Lab 07/04/16 1611 07/04/16 2018 07/05/16 0204  TROPONINI 0.24* 0.20* 0.22*    Microbiology Results  @[email or web address] @  RADIOLOGY:  Dg Chest 2 View  Result Date: 07/04/2016 CLINICAL DATA:  Shortness of breath. EXAM: CHEST  2 VIEW COMPARISON:  June 06, 2016 FINDINGS: Stable dialysis catheter. Stable cardiomegaly. The hila and mediastinum are normal. No focal  infiltrates identified. No overt edema. IMPRESSION: No active cardiopulmonary disease. Electronically Signed   By: Dorise Bullion III M.D   On: 07/04/2016 15:38   Ct Angio Chest Pe W Or Wo Contrast  Result Date: 07/04/2016 CLINICAL DATA:  61 year old male with acute shortness of breath. Patient with end-stage renal disease. EXAM: CT ANGIOGRAPHY CHEST WITH CONTRAST TECHNIQUE: Multidetector CT imaging of the chest was performed using the standard protocol during bolus administration of intravenous contrast. Multiplanar CT image reconstructions and MIPs were obtained to evaluate the vascular anatomy. CONTRAST:  75 cc intravenous Isovue 370 COMPARISON:  07/05/2015 and prior chest radiographs FINDINGS: Cardiovascular: Satisfactory opacification of the pulmonary arteries to the segmental level. No evidence of pulmonary embolism. Marked cardiomegaly and moderate to heavy coronary artery calcifications noted. The ascending  thoracic aorta measures 4.1 cm and the descending thoracic aorta measures 3 cm in greatest diameter. There is no evidence of pericardial effusion. Mediastinum/Nodes: No enlarged mediastinal, hilar, or axillary lymph nodes. Thyroid gland, trachea, and esophagus demonstrate no significant findings. Lungs/Pleura: Lungs are clear. No pleural effusion or pneumothorax. Upper Abdomen: A small amount of ascites adjacent to the liver is noted. There is an equivocal slightly nodular hepatic contour were and cirrhosis is not excluded. Musculoskeletal: No acute bony abnormality or suspicious focal bony lesions. Increased density of the visualized bony structures likely represents renal osteodystrophy. Review of the MIP images confirms the above findings. IMPRESSION: No evidence of pulmonary emboli. Ascending thoracic aortic aneurysm measuring 4.1 cm in greatest diameter. Recommend annual imaging followup by CTA or MRA. This recommendation follows 2010 ACCF/AHA/AATS/ACR/ASA/SCA/SCAI/SIR/STS/SVM Guidelines for the Diagnosis and Management of Patients with Thoracic Aortic Disease. Circulation. 2010; 121: O756-E332 Marked cardiomegaly and coronary artery disease. Small amount of ascites within the upper abdomen. Equivocal slightly nodular hepatic contour -cirrhosis not excluded. Electronically Signed   By: Margarette Canada M.D.   On: 07/04/2016 16:44      Current Discharge Medication List    CONTINUE these medications which have NOT CHANGED   Details  albuterol (PROVENTIL HFA;VENTOLIN HFA) 108 (90 Base) MCG/ACT inhaler Inhale into the lungs every 4 (four) hours as needed for wheezing or shortness of breath.    aspirin EC 81 MG tablet Take 1 tablet (81 mg total) by mouth daily. Qty: 90 tablet, Refills: 3    calcium acetate (PHOSLO) 667 MG capsule Take 1,334-2,001 mg by mouth 3 (three) times daily with meals. Take 2001 mgs with meals 3 times daily and 1334 with snacks    Calcium Carbonate Antacid (TUMS ULTRA 1000 PO) Take  2 tablets by mouth 3 (three) times daily before meals.    carvedilol (COREG) 6.25 MG tablet Take 1 tablet (6.25 mg total) by mouth 2 (two) times daily with a meal. Qty: 60 tablet, Refills: 0    cinacalcet (SENSIPAR) 90 MG tablet Take 90 mg by mouth daily.    hydrALAZINE (APRESOLINE) 50 MG tablet Take 1 tablet by mouth 3 (three) times daily.    ipratropium (ATROVENT HFA) 17 MCG/ACT inhaler Inhale 2 puffs into the lungs every 4 (four) hours as needed for wheezing.    isosorbide mononitrate (IMDUR) 30 MG 24 hr tablet Take 1 tablet (30 mg total) by mouth daily. Qty: 90 tablet, Refills: 3    lidocaine-prilocaine (EMLA) cream Apply 1 application topically as needed (dialysis access).    irbesartan (AVAPRO) 300 MG tablet Take 1 tablet by mouth daily.      STOP taking these medications     losartan (COZAAR) 25 MG  tablet             Management plans discussed with the patient and he is in agreement. Stable for discharge home  Patient should follow up with [facility]  CODE STATUS:     Code Status Orders        Start     Ordered   07/04/16 1930  Full code  Continuous     07/04/16 1929    Code Status History    Date Active Date Inactive Code Status Order ID Comments User Context   06/07/2016  1:02 AM 06/08/2016  3:14 PM Full Code [id number]  Hugelmeyer, Ubaldo Glassing, DO Inpatient   04/30/2016 12:07 AM 05/01/2016  6:21 PM Full Code [id number]  Lance Coon, MD Inpatient   02/26/2016  9:07 PM 02/28/2016  6:04 PM Full Code [id number]  Lance Coon, MD Inpatient   01/07/2016  1:32 AM 01/07/2016  5:24 PM Full Code [id number]  [place], [place], DO ED   08/02/2015 11:16 PM 08/04/2015 11:11 PM Full Code [id number]  Quintella Baton, MD Inpatient   12/02/2014  8:52 PM 12/04/2014  5:53 PM Full Code [id number]  Vaughan Basta, MD Inpatient   11/10/2014 11:23 PM 11/12/2014  3:06 PM Full Code [id number]  Gladstone Lighter, MD Inpatient      TOTAL TIME TAKING CARE OF THIS PATIENT: 37 minutes.    Note:  This dictation was prepared with Dragon dictation along with smaller phrase technology. Any transcriptional errors that result from this process are unintentional.  Sotirios Navarro M.D on 07/05/2016 at 7:01 AM  Between 7am to 6pm - Pager - [phone] After 6pm go to [email or web address] - password [email or web address] [facility]  Office  [phone]  CC: Primary care physician; [facility] [place]

## 2016-07-05 NOTE — Telephone Encounter (Signed)
Labs not received at this time. Patient is currently admitted in the hospital.  [name] from [facility] did say patient's potassium was 5.8 on 06/28/16.

## 2016-07-06 LAB — HEPATITIS B SURFACE ANTIGEN: Hepatitis B Surface Ag: NEGATIVE

## 2016-07-07 ENCOUNTER — Encounter: Payer: Self-pay | Admitting: Internal Medicine

## 2016-07-07 ENCOUNTER — Ambulatory Visit: Payer: Medicare Other | Admitting: Internal Medicine

## 2016-07-07 NOTE — Progress Notes (Deleted)
Follow-up Outpatient Visit Date: 07/07/2016  Primary Care Provider: [facility] [place] [address]  Chief Complaint: Follow-up heart failure  HPI:  [name] is a 61 y.o. year-old male with history of chronic systolic heart failure of uncertain etiology, Sergio Hobart-stage renal disease, hypertension, and peripheral vascular disease, who presents for follow-up of heart failure. I last saw him in the office on 05/20/16, at which time we discussed cardiac catheterization. He declined. We added isosorbide mononitrate for improved blood pressure control. The patient was admitted last week due to acute heart failure exacerbation with volume overload. He underwent hemodialysis and was discharged the next day, as he again refused cardiac catheterization.  Today, [name] reports that his breathing is better. He is able to walk up to 1/4 mile without needing to stop to catch his breath. He denies chest pain, palpitations, lightheadedness, edema, and PND. He has stable 4-pillow orthopnea. He has remained compliant with his medications, as well as hemodialysis.  --------------------------------------------------------------------------------------------------  Cardiovascular History & Procedures: Cardiovascular Problems:  Chronic systolic heart failure  Risk Factors:  Hypertension, male gender, age greater than 38, and tobacco use  Cath/PCI:  None  CV Surgery:  None  EP Procedures and Devices:  None  Non-Invasive Evaluation(s):  Transthoracic echocardiogram (04/30/16): Severely dilated left ventricle with LVEF of 20-25%. There is diffuse hypokinesis and grade 3 diastolic dysfunction. Moderate aortic regurgitation is present. Right ventricle appears moderately dilated. There is moderate tricuspid regurgitation.  Transthoracic echocardiogram (11/11/14): Moderately dilated LV with LVEF of 30-35% and diffuse hypokinesis. Grade 1  diastolic dysfunction. Mild aortic regurgitation. Mild to moderate eccentric MR. Moderately dilated left atrium. Normal RV size and function. Mild to moderate TR. Moderate to severe pulmonary hypertension.  Transthoracic echocardiogram (11/13/13, [facility]): Mild LVH with LVEF of 50%. Diastolic dysfunction with elevated filling pressure evident. Mitral valve demonstrates mild thickening with annular calcification and mild regurgitation. Left atrium is mildly enlarged. Trivial aortic regurgitation. Mildly dilated thoracic aorta. Trivial TR.  Pharmacologic myocardial perfusion stress test (10/09/13, [facility]): Low risk study with basal inferior defect more pronounced on the rest images likely due to artifact. LVEF mildly reduced at 48%. Coronary artery calcification and mitral annular calcification noted.  Recent CV Pertinent Labs: Lab Results  Component Value Date   CHOL 101 11/07/2012   HDL 39 (L) 11/07/2012   LDLCALC 38 11/07/2012   TRIG 119 11/07/2012   INR 1.1 04/26/2013   BNP >4,500.0 (H) 06/06/2016   K 3.6 07/05/2016   K 4.4 04/30/2013   MG 2.0 06/07/2016   MG 1.5 (L) 11/05/2012   BUN 32 (H) 07/05/2016   BUN 76 (H) 04/30/2013   CREATININE 5.38 (H) 07/05/2016   CREATININE 9.49 (H) 04/30/2013    Past medical and surgical history were reviewed and updated in EPIC.  Outpatient Encounter Prescriptions as of 07/07/2016  Medication Sig  . albuterol (PROVENTIL HFA;VENTOLIN HFA) 108 (90 Base) MCG/ACT inhaler Inhale into the lungs every 4 (four) hours as needed for wheezing or shortness of breath.  [name] aspirin EC 81 MG tablet Take 1 tablet (81 mg total) by mouth daily.  . calcium acetate (PHOSLO) 667 MG capsule Take 1,334-2,001 mg by mouth 3 (three) times daily with meals. Take 2001 mgs with meals 3 times daily and 1334 with snacks  . Calcium Carbonate Antacid (TUMS ULTRA 1000 PO) Take 2 tablets by mouth 3 (three) times daily before meals.  . carvedilol (COREG) 6.25 MG tablet Take 1 tablet (6.25  mg  total) by mouth 2 (two) times daily with a meal.  . cinacalcet (SENSIPAR) 90 MG tablet Take 90 mg by mouth daily.  . hydrALAZINE (APRESOLINE) 50 MG tablet Take 1 tablet by mouth 3 (three) times daily.  [name] ipratropium (ATROVENT HFA) 17 MCG/ACT inhaler Inhale 2 puffs into the lungs every 4 (four) hours as needed for wheezing.  . irbesartan (AVAPRO) 300 MG tablet Take 1 tablet by mouth daily.  . isosorbide mononitrate (IMDUR) 30 MG 24 hr tablet Take 1 tablet (30 mg total) by mouth daily.  [name] lidocaine-prilocaine (EMLA) cream Apply 1 application topically as needed (dialysis access).   No facility-administered encounter medications on file as of 07/07/2016.     Allergies: Sulfa antibiotics  Social History   Social History  . Marital status: Widowed    Spouse name: N/A  . Number of children: N/A  . Years of education: N/A   Occupational History  . Not on file.   Social History Main Topics  . Smoking status: Current Some Day Smoker    Packs/day: 0.25    Years: 40.00    Types: Cigarettes  . Smokeless tobacco: Never Used  . Alcohol use No  . Drug use: Yes    Frequency: 1.0 time per week    Types: Cocaine     Comment: last 2 weeks  . Sexual activity: Not Currently   Other Topics Concern  . Not on file   Social History Narrative   Lives at home in [place] by himself.   Independent on ambulation but does not routinely exercise.    Family History  Problem Relation Age of Onset  . Hypertension Son   . Diabetes Son   . Hypertension Mother   . Diabetes Mother   . Diabetes Sister     Review of Systems: A 12-system review of systems was performed and was negative except as noted in the HPI.  --------------------------------------------------------------------------------------------------  Physical Exam: There were no vitals taken for this visit.  General:  *** HEENT: No conjunctival pallor or scleral icterus.  Moist mucous membranes.  OP clear. Neck: Supple without  lymphadenopathy, thyromegaly, JVD, or HJR.  No carotid bruit. Lungs: Normal work of breathing.  Clear to auscultation bilaterally without wheezes or crackles. Heart: Regular rate and rhythm without murmurs, rubs, or gallops.  Non-displaced PMI. Abd: Bowel sounds present.  Soft, NT/ND without hepatosplenomegaly Ext: No lower extremity edema.  Radial, PT, and DP pulses are 2+ bilaterally. Skin: Warm and dry without rash.  EKG:  ***  Lab Results  Component Value Date   WBC 5.6 07/05/2016   HGB 11.5 (L) 07/05/2016   HCT 34.7 (L) 07/05/2016   MCV 93.0 07/05/2016   PLT 158 07/05/2016    Lab Results  Component Value Date   NA 139 07/05/2016   K 3.6 07/05/2016   CL 99 (L) 07/05/2016   CO2 27 07/05/2016   BUN 32 (H) 07/05/2016   CREATININE 5.38 (H) 07/05/2016   GLUCOSE 108 (H) 07/05/2016   ALT 21 07/05/2016    Lab Results  Component Value Date   CHOL 101 11/07/2012   HDL 39 (L) 11/07/2012   LDLCALC 38 11/07/2012   TRIG 119 11/07/2012    --------------------------------------------------------------------------------------------------  ASSESSMENT AND PLAN: Chronic systolic and diastolic heart failure Patient appears euvolemic with NYHA class II symptoms. We discussed importance of ischemia evaluation; I have again recommended left heart catheterization, which [name] refuses. He is concerned about the risks of the procedure and need  for femoral access despite my reassurances that the overall risk for significant complications with diagnostic catheterization is low. Given that he has ESRD, I would like to avoid radial access. He also is not interested in myocardial perfusion stress test at this time. I have spoken with Dr. Candiss Norse, who is ok with restarting ARB. We will add losartan 25 mg daily to his current regimen of carvedilol, hydralazine, and isosorbide mononitrate.  Hypertension Blood pressure suboptimally controlled, as above, we will add losartan. He will need repeat labs  to ensure potassium is not trending up and HD regimen does not need to be altered.  Follow-up: Return to clinic in 6 weeks.  Nelva Bush, MD 07/07/2016 8:04 AM

## 2016-07-08 ENCOUNTER — Ambulatory Visit: Payer: Medicare Other | Admitting: Family

## 2016-07-08 ENCOUNTER — Telehealth: Payer: Self-pay | Admitting: Family

## 2016-07-08 NOTE — Telephone Encounter (Signed)
Patient did not show for his Heart Failure Clinic appointment on 07/08/16. Will attempt to reschedule.

## 2016-07-12 ENCOUNTER — Encounter (INDEPENDENT_AMBULATORY_CARE_PROVIDER_SITE_OTHER): Payer: Self-pay

## 2016-07-12 ENCOUNTER — Encounter (INDEPENDENT_AMBULATORY_CARE_PROVIDER_SITE_OTHER): Payer: Self-pay | Admitting: Vascular Surgery

## 2016-07-12 ENCOUNTER — Ambulatory Visit (INDEPENDENT_AMBULATORY_CARE_PROVIDER_SITE_OTHER): Payer: Medicare Other | Admitting: Vascular Surgery

## 2016-07-12 ENCOUNTER — Other Ambulatory Visit (INDEPENDENT_AMBULATORY_CARE_PROVIDER_SITE_OTHER): Payer: Self-pay | Admitting: Vascular Surgery

## 2016-07-12 VITALS — BP 144/87 | HR 73 | Resp 16 | Ht 68.0 in | Wt 169.0 lb

## 2016-07-12 DIAGNOSIS — Z992 Dependence on renal dialysis: Secondary | ICD-10-CM

## 2016-07-12 DIAGNOSIS — I10 Essential (primary) hypertension: Secondary | ICD-10-CM

## 2016-07-12 DIAGNOSIS — I871 Compression of vein: Secondary | ICD-10-CM | POA: Diagnosis not present

## 2016-07-12 DIAGNOSIS — T829XXS Unspecified complication of cardiac and vascular prosthetic device, implant and graft, sequela: Secondary | ICD-10-CM

## 2016-07-12 DIAGNOSIS — N186 End stage renal disease: Secondary | ICD-10-CM | POA: Diagnosis not present

## 2016-07-12 NOTE — Progress Notes (Signed)
MRN : [id number]  Camaron Cammack is a 61 y.o. (1955/03/30) male who presents with chief complaint of No chief complaint on file. [name]  History of Present Illness:  The patient is seen for evaluation of dialysis access.  The patient has a history of poorly functional left wrist fistula  Current access is via a catheter which is functioning poorly.  There have not been any episodes of catheter infection.  The patient denies amaurosis fugax or recent TIA symptoms. There are no recent neurological changes noted. The patient denies claudication symptoms or rest pain symptoms. The patient denies history of DVT, PE or superficial thrombophlebitis. The patient denies recent episodes of angina or shortness of breath.    No outpatient prescriptions have been marked as taking for the 07/12/16 encounter (Appointment) with Delana Meyer, Dolores Lory, MD.    Past Medical History:  Diagnosis Date  . Chest pain    a. 09/2013 Myoview [facility]): basal inf defect more pronounced @ rest, likely artifact, EF 48%, low risk study.  . Chronic combined systolic and diastolic CHF (congestive heart failure) ([place])    a. 10/2013 Echo [facility]): EF 50%, diast dysfxn, triv AI/TR, mild MR;  b. 10/2014 Echo: EF 30-35%, diff HK, gr1 DD, mild AI, mild to mod MR/TR, mod dil LA, nl RV, mod to sev PAH;  c. 04/2016 Echo: EF 20-25%, sev dil LV, diff HK, gr3 DD, mod AI, MV syst bowing w/ prolapse, mod dil RV, mod TR.  [name] ESRD (end stage renal disease) ([employer])    a. MWF dialysis @ [facility].  . Hypertension   . Hypertensive heart disease with CHF (congestive heart failure) ([place])   . Peripheral vascular disease ([place])   . Pulmonary hypertension ([place]) 04/2016  . Renal insufficiency     Past Surgical History:  Procedure Laterality Date  . A/V SHUNT INTERVENTION N/A 04/06/2016   Procedure: A/V Shunt Intervention;  Surgeon: Katha Cabal, MD;  Location: [place] [facility];  Service: Cardiovascular;  Laterality: N/A;  . A/V SHUNT  INTERVENTION N/A 06/29/2016   Procedure: A/V Shunt Intervention;  Surgeon: Katha Cabal, MD;  Location: [place] [facility];  Service: Cardiovascular;  Laterality: N/A;  . A/V SHUNTOGRAM Left 04/06/2016   Procedure: A/V Fistulagram;  Surgeon: Katha Cabal, MD;  Location: [place] [facility];  Service: Cardiovascular;  Laterality: Left;  . AV FISTULA PLACEMENT Left 2014  . PERIPHERAL VASCULAR CATHETERIZATION N/A 10/03/2014   Procedure: A/V Shuntogram/Fistulagram;  Surgeon: Algernon Huxley, MD;  Location: [place] [facility];  Service: Cardiovascular;  Laterality: N/A;  . PERIPHERAL VASCULAR CATHETERIZATION Left 10/03/2014   Procedure: A/V Shunt Intervention;  Surgeon: Algernon Huxley, MD;  Location: [place] [facility];  Service: Cardiovascular;  Laterality: Left;  . PERIPHERAL VASCULAR CATHETERIZATION Left 05/01/2015   Procedure: A/V Shuntogram/Fistulagram;  Surgeon: Algernon Huxley, MD;  Location: [place] [facility];  Service: Cardiovascular;  Laterality: Left;  . PERIPHERAL VASCULAR CATHETERIZATION N/A 05/01/2015   Procedure: A/V Shunt Intervention;  Surgeon: Algernon Huxley, MD;  Location: [place] [facility];  Service: Cardiovascular;  Laterality: N/A;  . UPPER EXTREMITY ANGIOGRAPHY Bilateral 06/29/2016   Procedure: Upper Extremity Angiography;  Surgeon: Katha Cabal, MD;  Location: [place] [facility];  Service: Cardiovascular;  Laterality: Bilateral;  . UPPER EXTREMITY INTERVENTION  06/29/2016   Procedure: Upper Extremity Intervention;  Surgeon: Katha Cabal, MD;  Location: [place] [facility];  Service: Cardiovascular;;    Social History Social  History  Substance Use Topics  . Smoking status: Current Some Day Smoker    Packs/day: 0.25    Years: 40.00    Types: Cigarettes  . Smokeless tobacco: Never Used  . Alcohol use No    Family History Family History  Problem Relation Age of Onset  . Hypertension Son   . Diabetes Son   . Hypertension Mother   . Diabetes Mother   .  Diabetes Sister     Allergies  Allergen Reactions  . Sulfa Antibiotics Rash     REVIEW OF SYSTEMS (Negative unless checked)  Constitutional: [] Weight loss  [] Fever  [] Chills Cardiac: [] Chest pain   [] Chest pressure   [] Palpitations   [] Shortness of breath when laying flat   [] Shortness of breath with exertion. Vascular:  [] Pain in legs with walking   [] Pain in legs at rest  [] History of DVT   [] Phlebitis   [] Swelling in legs   [] Varicose veins   [] Non-healing ulcers Pulmonary:   [] Uses home oxygen   [] Productive cough   [] Hemoptysis   [] Wheeze  [] COPD   [] Asthma Neurologic:  [] Dizziness   [] Seizures   [] History of stroke   [] History of TIA  [] Aphasia   [] Vissual changes   [] Weakness or numbness in arm   [] Weakness or numbness in leg Musculoskeletal:   [] Joint swelling   [] Joint pain   [] Low back pain Hematologic:  [] Easy bruising  [] Easy bleeding   [] Hypercoagulable state   [] Anemic Gastrointestinal:  [] Diarrhea   [] Vomiting  [] Gastroesophageal reflux/heartburn   [] Difficulty swallowing. Genitourinary:  [] Chronic kidney disease   [] Difficult urination  [] Frequent urination   [] Blood in urine Skin:  [] Rashes   [] Ulcers  Psychological:  [] History of anxiety   []  History of major depression.  Physical Examination  There were no vitals filed for this visit. There is no height or weight on file to calculate BMI. Gen: WD/WN, NAD Head: [place]/AT, No temporalis wasting.  Ear/Nose/Throat: Hearing grossly intact, nares w/o erythema or drainage Eyes: PER, EOMI, sclera nonicteric.  Neck: Supple, no large masses.   Pulmonary:  Good air movement, no audible wheezing bilaterally, no use of accessory muscles.  Cardiac: RRR, no JVD Vascular: left wrist fistula strongly pulsatile, multile large distended veins  Vessel Right Left  Radial Palpable Palpable  Ulnar Palpable Palpable  Brachial Palpable Palpable  Gastrointestinal: Non-distended. No guarding/no peritoneal signs.  Musculoskeletal: M/S 5/5  throughout.  No deformity or atrophy.  Neurologic: CN 2-12 intact. Symmetrical.  Speech is fluent. Motor exam as listed above. Psychiatric: Judgment intact, Mood & affect appropriate for pt's clinical situation. Dermatologic: No rashes or ulcers noted.  No changes consistent with cellulitis. Lymph : No lichenification or skin changes of chronic lymphedema.  CBC Lab Results  Component Value Date   WBC 5.6 07/05/2016   HGB 11.5 (L) 07/05/2016   HCT 34.7 (L) 07/05/2016   MCV 93.0 07/05/2016   PLT 158 07/05/2016    BMET    Component Value Date/Time   NA 139 07/05/2016 0204   NA 139 04/30/2013 1203   K 3.6 07/05/2016 0204   K 4.4 04/30/2013 1203   CL 99 (L) 07/05/2016 0204   CL 107 04/30/2013 1203   CO2 27 07/05/2016 0204   CO2 25 04/30/2013 1203   GLUCOSE 108 (H) 07/05/2016 0204   GLUCOSE 104 (H) 04/30/2013 1203   BUN 32 (H) 07/05/2016 0204   BUN 76 (H) 04/30/2013 1203   CREATININE 5.38 (H) 07/05/2016 0204   CREATININE 9.49 (H)  04/30/2013 1203   CALCIUM 8.7 (L) 07/05/2016 0204   CALCIUM 8.0 (L) 04/30/2013 1203   GFRNONAA 10 (L) 07/05/2016 0204   GFRNONAA 5 (L) 04/30/2013 1203   GFRAA 12 (L) 07/05/2016 0204   GFRAA 6 (L) 04/30/2013 1203   Estimated Creatinine Clearance: 13.8 mL/min (A) (by C-G formula based on SCr of 5.38 mg/dL (H)).  COAG Lab Results  Component Value Date   INR 1.1 04/26/2013   INR 1.0 07/19/2012   INR 1.2 12/12/2011    Radiology Dg Chest 2 View  Result Date: 07/04/2016 CLINICAL DATA:  Shortness of breath. EXAM: CHEST  2 VIEW COMPARISON:  June 06, 2016 FINDINGS: Stable dialysis catheter. Stable cardiomegaly. The hila and mediastinum are normal. No focal infiltrates identified. No overt edema. IMPRESSION: No active cardiopulmonary disease. Electronically Signed   By: Dorise Bullion III M.D   On: 07/04/2016 15:38   Ct Angio Chest Pe W Or Wo Contrast  Result Date: 07/04/2016 CLINICAL DATA:  61 year old male with acute shortness of breath. Patient  with end-stage renal disease. EXAM: CT ANGIOGRAPHY CHEST WITH CONTRAST TECHNIQUE: Multidetector CT imaging of the chest was performed using the standard protocol during bolus administration of intravenous contrast. Multiplanar CT image reconstructions and MIPs were obtained to evaluate the vascular anatomy. CONTRAST:  75 cc intravenous Isovue 370 COMPARISON:  07/05/2015 and prior chest radiographs FINDINGS: Cardiovascular: Satisfactory opacification of the pulmonary arteries to the segmental level. No evidence of pulmonary embolism. Marked cardiomegaly and moderate to heavy coronary artery calcifications noted. The ascending thoracic aorta measures 4.1 cm and the descending thoracic aorta measures 3 cm in greatest diameter. There is no evidence of pericardial effusion. Mediastinum/Nodes: No enlarged mediastinal, hilar, or axillary lymph nodes. Thyroid gland, trachea, and esophagus demonstrate no significant findings. Lungs/Pleura: Lungs are clear. No pleural effusion or pneumothorax. Upper Abdomen: A small amount of ascites adjacent to the liver is noted. There is an equivocal slightly nodular hepatic contour were and cirrhosis is not excluded. Musculoskeletal: No acute bony abnormality or suspicious focal bony lesions. Increased density of the visualized bony structures likely represents renal osteodystrophy. Review of the MIP images confirms the above findings. IMPRESSION: No evidence of pulmonary emboli. Ascending thoracic aortic aneurysm measuring 4.1 cm in greatest diameter. Recommend annual imaging followup by CTA or MRA. This recommendation follows 2010 ACCF/AHA/AATS/ACR/ASA/SCA/SCAI/SIR/STS/SVM Guidelines for the Diagnosis and Management of Patients with Thoracic Aortic Disease. Circulation. 2010; 121: Y774-J287 Marked cardiomegaly and coronary artery disease. Small amount of ascites within the upper abdomen. Equivocal slightly nodular hepatic contour -cirrhosis not excluded. Electronically Signed   By:  Margarette Canada M.D.   On: 07/04/2016 16:44    Assessment/Plan 1. ESRD (end stage renal disease) on dialysis [facility]) Recommend:  At this time the patient does not have appropriate extremity access for dialysis.  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  Patient should have a venogram with stenting of the left innominate vein and then a left basilic vein transposition created.  At the time of surgery he will require ligation of the wrist fistula and he has requested excision of the aneurysmal portion of the the radiocephalic fistula.  This will be done in two separate sittings.  The risks, benefits and alternative therapies were reviewed in detail with the patient.  All questions were answered.  The patient agrees to proceed with surgery.    2. Complication from renal dialysis device, sequela See #1  3. Stricture of vein Plan for stenting  4.  Essential hypertension Continue antihypertensive medications as already ordered, these medications have been reviewed and there are no changes at this time.    Hortencia Pilar, MD  07/12/2016 12:49 PM

## 2016-07-13 ENCOUNTER — Encounter: Payer: Self-pay | Admitting: Emergency Medicine

## 2016-07-13 ENCOUNTER — Emergency Department: Payer: Medicare Other

## 2016-07-13 ENCOUNTER — Emergency Department
Admission: EM | Admit: 2016-07-13 | Discharge: 2016-07-14 | Disposition: A | Discharge status: Home / Self Care | Payer: Medicare Other | Attending: Emergency Medicine | Admitting: Emergency Medicine

## 2016-07-13 DIAGNOSIS — I5042 Chronic combined systolic (congestive) and diastolic (congestive) heart failure: Secondary | ICD-10-CM | POA: Insufficient documentation

## 2016-07-13 DIAGNOSIS — I132 Hypertensive heart and chronic kidney disease with heart failure and with stage 5 chronic kidney disease, or end stage renal disease: Secondary | ICD-10-CM | POA: Diagnosis not present

## 2016-07-13 DIAGNOSIS — Z7982 Long term (current) use of aspirin: Secondary | ICD-10-CM | POA: Diagnosis not present

## 2016-07-13 DIAGNOSIS — N186 End stage renal disease: Secondary | ICD-10-CM | POA: Diagnosis not present

## 2016-07-13 DIAGNOSIS — E1122 Type 2 diabetes mellitus with diabetic chronic kidney disease: Secondary | ICD-10-CM | POA: Diagnosis not present

## 2016-07-13 DIAGNOSIS — R0600 Dyspnea, unspecified: Secondary | ICD-10-CM

## 2016-07-13 DIAGNOSIS — F17210 Nicotine dependence, cigarettes, uncomplicated: Secondary | ICD-10-CM | POA: Diagnosis not present

## 2016-07-13 DIAGNOSIS — R0602 Shortness of breath: Secondary | ICD-10-CM | POA: Insufficient documentation

## 2016-07-13 DIAGNOSIS — Z79899 Other long term (current) drug therapy: Secondary | ICD-10-CM | POA: Diagnosis not present

## 2016-07-13 DIAGNOSIS — Z992 Dependence on renal dialysis: Secondary | ICD-10-CM | POA: Diagnosis not present

## 2016-07-13 LAB — CBC WITH DIFFERENTIAL/PLATELET
Basophils Absolute: 0.1 10*3/uL (ref 0–0.1)
Basophils Relative: 1 %
EOS ABS: 0.1 10*3/uL (ref 0–0.7)
Eosinophils Relative: 2 %
HCT: 33.1 % — ABNORMAL LOW (ref 40.0–52.0)
HEMOGLOBIN: 10.9 g/dL — AB (ref 13.0–18.0)
LYMPHS PCT: 14 %
Lymphs Abs: 0.7 10*3/uL — ABNORMAL LOW (ref 1.0–3.6)
MCH: 30.2 pg (ref 26.0–34.0)
MCHC: 32.8 g/dL (ref 32.0–36.0)
MCV: 91.9 fL (ref 80.0–100.0)
MONOS PCT: 14 %
Monocytes Absolute: 0.7 10*3/uL (ref 0.2–1.0)
NEUTROS PCT: 69 %
Neutro Abs: 3.7 10*3/uL (ref 1.4–6.5)
Platelets: 158 10*3/uL (ref 150–440)
RBC: 3.6 MIL/uL — ABNORMAL LOW (ref 4.40–5.90)
RDW: 17.3 % — AB (ref 11.5–14.5)
WBC: 5.3 10*3/uL (ref 3.8–10.6)

## 2016-07-13 LAB — COMPREHENSIVE METABOLIC PANEL
ALK PHOS: 77 U/L (ref 38–126)
ALT: 32 U/L (ref 17–63)
ANION GAP: 10 (ref 5–15)
AST: 44 U/L — ABNORMAL HIGH (ref 15–41)
Albumin: 3.5 g/dL (ref 3.5–5.0)
BUN: 39 mg/dL — ABNORMAL HIGH (ref 6–20)
CALCIUM: 8.3 mg/dL — AB (ref 8.9–10.3)
CO2: 23 mmol/L (ref 22–32)
Chloride: 106 mmol/L (ref 101–111)
Creatinine, Ser: 5.64 mg/dL — ABNORMAL HIGH (ref 0.61–1.24)
GFR calc non Af Amer: 10 mL/min — ABNORMAL LOW (ref >60)
GFR, EST AFRICAN AMERICAN: 11 mL/min — AB (ref >60)
Glucose, Bld: 169 mg/dL — ABNORMAL HIGH (ref 65–99)
POTASSIUM: 4.2 mmol/L (ref 3.5–5.1)
SODIUM: 139 mmol/L (ref 135–145)
TOTAL PROTEIN: 7.4 g/dL (ref 6.5–8.1)
Total Bilirubin: 0.5 mg/dL (ref 0.3–1.2)

## 2016-07-13 LAB — TROPONIN I: Troponin I: 0.13 ng/mL (ref <0.03)

## 2016-07-13 MED ORDER — ALBUTEROL SULFATE (2.5 MG/3ML) 0.083% IN NEBU
5.0000 mg | INHALATION_SOLUTION | Freq: Once | RESPIRATORY_TRACT | Status: DC
  Filled 2016-07-13: qty 6

## 2016-07-13 NOTE — ED Triage Notes (Signed)
Patient comes in from home via [facility] with shortness of breath and some central chest pain. Patient is a dialysis patient and had dialysis yesterday, patient went back today for 2 hrs for more dialysis due to his shortness of breath. Per EMS patient was standing waiting for them on the side walk. Per EMS 164/97, 100% room air, NSR 81bpm.

## 2016-07-13 NOTE — ED Provider Notes (Signed)
[name] Provider Note  Time seen: 11:54 PM  I have reviewed the triage vital signs and the nursing notes.   HISTORY  Chief Complaint Shortness of Breath and Chest Pain    HPI Kamarius Buckbee is a 61 y.o. male with a past medical history of end-stage renal disease on hemodialysis, hypertension, pulmonary hypertension, presents to the emergency department for difficulty breathing. According to the patient he had his full dialysis session yesterday, he woke up this morning feeling short of breath so he called his nephrologist and they arranged for him to have an additional session today. Patient states he continues to feel short of breath so he came to the emergency department. Denies any chest pain. States mild swelling in his feet. Denies any fever, cough or congestion.  Past Medical History:  Diagnosis Date  . Chest pain    a. 09/2013 Myoview [name]): basal inf defect more pronounced @ rest, likely artifact, EF 48%, low risk study.  . Chronic combined systolic and diastolic CHF (congestive heart failure) ([place])    a. 10/2013 Echo [name]): EF 50%, diast dysfxn, triv AI/TR, mild MR;  b. 10/2014 Echo: EF 30-35%, diff HK, gr1 DD, mild AI, mild to mod MR/TR, mod dil LA, nl RV, mod to sev PAH;  c. 04/2016 Echo: EF 20-25%, sev dil LV, diff HK, gr3 DD, mod AI, MV syst bowing w/ prolapse, mod dil RV, mod TR.  [name] ESRD (end stage renal disease) ([place])    a. MWF dialysis @ [facility].  . Hypertension   . Hypertensive heart disease with CHF (congestive heart failure) ([place])   . Peripheral vascular disease ([place])   . Pulmonary hypertension ([place]) 04/2016  . Renal insufficiency     Patient Active Problem List   Diagnosis Date Noted  . 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 Hyperkalemia 07/04/2016  . Abnormal EKG 07/04/2016  . Elevated troponin   . Acute respiratory failure with hypoxia ([place]) 06/06/2016  . Chest pain 05/22/2016  . Sepsis ([place]) 02/26/2016  .  HCAP (healthcare-associated pneumonia) 02/26/2016  . Diabetes ([place]) 02/26/2016  . Community acquired pneumonia 01/07/2016  . Renal dialysis device, implant, or graft complication 92/12/9415  . Tobacco use 09/04/2015  . ESRD (end stage renal disease) on dialysis ([place]) 08/02/2015  . Fluid overload 08/02/2015  . HTN (hypertension) 08/02/2015  . Chronic combined systolic and diastolic heart failure ([place]) 11/10/2014    Past Surgical History:  Procedure Laterality Date  . A/V SHUNT INTERVENTION N/A 04/06/2016   Procedure: A/V Shunt Intervention;  Surgeon: Katha Cabal, MD;  Location: [place] [facility];  Service: Cardiovascular;  Laterality: N/A;  . A/V SHUNT INTERVENTION N/A 06/29/2016   Procedure: A/V Shunt Intervention;  Surgeon: Katha Cabal, MD;  Location: [place] [facility];  Service: Cardiovascular;  Laterality: N/A;  . A/V SHUNTOGRAM Left 04/06/2016   Procedure: A/V Fistulagram;  Surgeon: Katha Cabal, MD;  Location: [place] [facility];  Service: Cardiovascular;  Laterality: Left;  . AV FISTULA PLACEMENT Left 2014  . PERIPHERAL VASCULAR CATHETERIZATION N/A 10/03/2014   Procedure: A/V Shuntogram/Fistulagram;  Surgeon: Algernon Huxley, MD;  Location: [place] [facility];  Service: Cardiovascular;  Laterality: N/A;  . PERIPHERAL VASCULAR CATHETERIZATION Left 10/03/2014   Procedure: A/V Shunt Intervention;  Surgeon: Algernon Huxley, MD;  Location: [place] [facility];  Service: Cardiovascular;  Laterality: Left;  . PERIPHERAL VASCULAR CATHETERIZATION Left 05/01/2015   Procedure: A/V Shuntogram/Fistulagram;  Surgeon: Algernon Huxley,  MD;  Location: [place] [facility];  Service: Cardiovascular;  Laterality: Left;  . PERIPHERAL VASCULAR CATHETERIZATION N/A 05/01/2015   Procedure: A/V Shunt Intervention;  Surgeon: Algernon Huxley, MD;  Location: [place] [facility];  Service: Cardiovascular;  Laterality: N/A;  . UPPER EXTREMITY ANGIOGRAPHY Bilateral 06/29/2016   Procedure: Upper Extremity  Angiography;  Surgeon: Katha Cabal, MD;  Location: [place] [facility];  Service: Cardiovascular;  Laterality: Bilateral;  . UPPER EXTREMITY INTERVENTION  06/29/2016   Procedure: Upper Extremity Intervention;  Surgeon: Katha Cabal, MD;  Location: [place] [facility];  Service: Cardiovascular;;    Prior to Admission medications   Medication Sig Start Date End Date Taking? Authorizing Provider  albuterol (PROVENTIL HFA;VENTOLIN HFA) 108 (90 Base) MCG/ACT inhaler Inhale into the lungs every 4 (four) hours as needed for wheezing or shortness of breath.    [provider]  aspirin EC 81 MG tablet Take 1 tablet (81 mg total) by mouth daily. 05/20/16   End, Harrell Gave, MD  calcium acetate (PHOSLO) 667 MG capsule Take 1,334-2,001 mg by mouth 3 (three) times daily with meals. Take 2001 mgs with meals 3 times daily and 1334 with snacks 12/11/15   [provider]  Calcium Carbonate Antacid (TUMS ULTRA 1000 PO) Take 2 tablets by mouth 3 (three) times daily before meals.    [provider]  carvedilol (COREG) 6.25 MG tablet Take 1 tablet (6.25 mg total) by mouth 2 (two) times daily with a meal. 05/01/16   Bettey Costa, MD  cinacalcet (SENSIPAR) 90 MG tablet Take 90 mg by mouth daily.    [provider]  hydrALAZINE (APRESOLINE) 50 MG tablet Take 1 tablet by mouth 3 (three) times daily. 07/01/16   [provider]  ipratropium (ATROVENT HFA) 17 MCG/ACT inhaler Inhale 2 puffs into the lungs every 4 (four) hours as needed for wheezing.    [provider]  irbesartan (AVAPRO) 300 MG tablet Take 1 tablet by mouth daily. 07/01/16   [provider]  isosorbide mononitrate (IMDUR) 30 MG 24 hr tablet Take 1 tablet (30 mg total) by mouth daily. 05/20/16 08/18/16  End, Harrell Gave, MD  lidocaine-prilocaine (EMLA) cream Apply 1 application topically as needed (dialysis access).    [provider]  losartan (COZAAR) 25 MG tablet  06/17/16    [provider]    Allergies  Allergen Reactions  . Sulfa Antibiotics Rash    Family History  Problem Relation Age of Onset  . Hypertension Son   . Diabetes Son   . Hypertension Mother   . Diabetes Mother   . Diabetes Sister     Social History Social History  Substance Use Topics  . Smoking status: Current Some Day Smoker    Packs/day: 0.25    Years: 40.00    Types: Cigarettes  . Smokeless tobacco: Never Used  . Alcohol use No    Review of Systems Constitutional: Negative for fever. Cardiovascular: Negative for chest pain. Respiratory:Positive for shortness of breath Gastrointestinal: Negative for abdominal pain Genitourinary: Negative for dysuria. Musculoskeletal: Negative for back pain. Skin: Negative for rash. Neurological: Negative for headache All other ROS negative  ____________________________________________   PHYSICAL EXAM:  VITAL SIGNS: ED Triage Vitals  Enc Vitals Group     BP 07/13/16 2311 (!) 157/86     Pulse Rate 07/13/16 2311 80     Resp 07/13/16 2311 13     Temp 07/13/16 2311 98.2 F (36.8 C)     Temp  Source 07/13/16 2311 Oral     SpO2 07/13/16 2311 99 %     Weight 07/13/16 2307 169 lb (76.7 kg)     Height 07/13/16 2307 5\' 8"  (1.727 m)     Head Circumference --      Peak Flow --      Pain Score 07/13/16 2306 0     Pain Loc --      Pain Edu? --      Excl. in [place]? --     Constitutional: Alert and oriented. Well appearing and in no distress. Eyes: Normal exam ENT   Head: Normocephalic and atraumatic.   Mouth/Throat: Mucous membranes are moist. Cardiovascular: Normal rate, regular rhythm.  Respiratory: Normal respiratory effort without tachypnea nor retractions. Breath sounds are clear. No wheezes, rales or rhonchi. Right subclavian dialysis catheter present. Gastrointestinal: Soft and nontender. No distention.   Musculoskeletal: Nontender with normal range of motion in all extremities. No lower extremity edema. Left  upper extremity AV fistula. Neurologic:  Normal speech and language. No gross focal neurologic deficits  Skin:  Skin is warm, dry and intact.  Psychiatric: Mood and affect are normal.   ____________________________________________    EKG  EKG reviewed and interpreted by myself shows sinus rhythm at 79 bpm, left axis deviation, slight QTC prolongation, nonspecific ST changes with lateral T-wave inversions. EKG unchanged from prior 07/04/16.  ____________________________________________    RADIOLOGY  Chest x-ray shows cardiomegaly without edema.  ____________________________________________   INITIAL IMPRESSION / ASSESSMENT AND PLAN / ED COURSE  Pertinent labs & imaging results that were available during my care of the patient were reviewed by me and considered in my medical decision making (see chart for details).  Patient presents the emergency department for shortness of breath. Patient states he has been feeling short of breath for the past 2 or 3 days. He had an extra dialysis session this morning but continues to feel short of breath so he came to the emergency department. Upon arrival the patient has a 99% room air saturation, normal respiratory rate, patient is in no distress. Overall the patient appears well on exam, no significant lower extremity edema, clear breath sounds. Labs are largely at the patient's baseline. Troponin is elevated however again at baseline. Chest x-ray is clear without edema. We will monitor the patient in the emergency department, anticipate likely discharge home.  Patient's chest x-ray is negative for edema. EKG is unchanged from prior. Labs are unchanged from prior. Normal potassium. Troponin is elevated however this appears to be baseline for the patient. I discussed these results with the patient, he will follow-up with his doctor. We will discharge home.  ____________________________________________   FINAL CLINICAL IMPRESSION(S) / ED  DIAGNOSES  Dyspnea    Harvest Dark, MD 07/14/16 [phone]

## 2016-07-14 DIAGNOSIS — R0602 Shortness of breath: Secondary | ICD-10-CM | POA: Diagnosis not present

## 2016-07-14 MED ORDER — IPRATROPIUM-ALBUTEROL 0.5-2.5 (3) MG/3ML IN SOLN
3.0000 mL | Freq: Once | RESPIRATORY_TRACT | Status: AC
  Administered 2016-07-14: 3 mL via RESPIRATORY_TRACT
  Filled 2016-07-14: qty 3

## 2016-07-14 NOTE — ED Notes (Signed)
Patient d/c to lobby to call for a ride. Patient unsure of who he can call at this hour. Patient will let first RN know about ride. First nurse aware.

## 2016-07-19 MED ORDER — CEFAZOLIN SODIUM-DEXTROSE 1-4 GM/50ML-% IV SOLN: 1.0000 g | Freq: Once | INTRAVENOUS | Status: DC

## 2016-07-20 ENCOUNTER — Other Ambulatory Visit (INDEPENDENT_AMBULATORY_CARE_PROVIDER_SITE_OTHER): Payer: Self-pay | Admitting: Vascular Surgery

## 2016-07-20 ENCOUNTER — Encounter: Admission: RE | Payer: Self-pay | Source: Ambulatory Visit

## 2016-07-20 SURGERY — UPPER EXTREMITY VENOGRAPHY
Anesthesia: Moderate Sedation | Laterality: Left

## 2016-07-20 MED ORDER — HEPARIN SODIUM (PORCINE) 1000 UNIT/ML IJ SOLN
INTRAMUSCULAR | Status: AC
  Filled 2016-07-20: qty 1

## 2016-07-21 ENCOUNTER — Ambulatory Visit: Admission: RE | Admit: 2016-07-21 | Payer: Medicare Other | Source: Ambulatory Visit | Admitting: Vascular Surgery

## 2016-07-22 ENCOUNTER — Inpatient Hospital Stay: Admission: RE | Admit: 2016-07-22 | Payer: Medicare Other | Source: Ambulatory Visit

## 2016-07-26 ENCOUNTER — Inpatient Hospital Stay: Admission: RE | Admit: 2016-07-26 | Payer: Medicare Other | Source: Ambulatory Visit

## 2016-07-27 ENCOUNTER — Encounter
Admission: RE | Admit: 2016-07-27 | Discharge: 2016-07-27 | Disposition: A | Discharge status: Home / Self Care | Payer: Medicare Other | Source: Ambulatory Visit | Attending: Vascular Surgery | Admitting: Vascular Surgery

## 2016-07-27 DIAGNOSIS — N186 End stage renal disease: Secondary | ICD-10-CM | POA: Diagnosis not present

## 2016-07-27 DIAGNOSIS — I132 Hypertensive heart and chronic kidney disease with heart failure and with stage 5 chronic kidney disease, or end stage renal disease: Secondary | ICD-10-CM | POA: Diagnosis not present

## 2016-07-27 DIAGNOSIS — Z01810 Encounter for preprocedural cardiovascular examination: Secondary | ICD-10-CM | POA: Diagnosis not present

## 2016-07-27 DIAGNOSIS — Z7984 Long term (current) use of oral hypoglycemic drugs: Secondary | ICD-10-CM | POA: Diagnosis not present

## 2016-07-27 DIAGNOSIS — E1122 Type 2 diabetes mellitus with diabetic chronic kidney disease: Secondary | ICD-10-CM | POA: Diagnosis not present

## 2016-07-27 DIAGNOSIS — Z79899 Other long term (current) drug therapy: Secondary | ICD-10-CM | POA: Diagnosis not present

## 2016-07-27 DIAGNOSIS — I5089 Other heart failure: Secondary | ICD-10-CM | POA: Diagnosis not present

## 2016-07-27 DIAGNOSIS — Z538 Procedure and treatment not carried out for other reasons: Secondary | ICD-10-CM | POA: Diagnosis not present

## 2016-07-27 DIAGNOSIS — T82898A Other specified complication of vascular prosthetic devices, implants and grafts, initial encounter: Secondary | ICD-10-CM | POA: Diagnosis not present

## 2016-07-27 DIAGNOSIS — Z01812 Encounter for preprocedural laboratory examination: Secondary | ICD-10-CM | POA: Diagnosis not present

## 2016-07-27 DIAGNOSIS — Z992 Dependence on renal dialysis: Secondary | ICD-10-CM | POA: Diagnosis not present

## 2016-07-27 HISTORY — DX: Dyspnea, unspecified: R06.00

## 2016-07-27 HISTORY — DX: Unspecified osteoarthritis, unspecified site: M19.90

## 2016-07-27 HISTORY — DX: Inflammatory liver disease, unspecified: K75.9

## 2016-07-27 LAB — TYPE AND SCREEN
ABO/RH(D): O POS
ANTIBODY SCREEN: NEGATIVE

## 2016-07-27 LAB — CBC WITH DIFFERENTIAL/PLATELET
BASOS PCT: 1 %
Basophils Absolute: 0 10*3/uL (ref 0–0.1)
EOS ABS: 0.1 10*3/uL (ref 0–0.7)
EOS PCT: 2 %
HCT: 28.5 % — ABNORMAL LOW (ref 40.0–52.0)
HEMOGLOBIN: 9.6 g/dL — AB (ref 13.0–18.0)
LYMPHS ABS: 0.7 10*3/uL — AB (ref 1.0–3.6)
Lymphocytes Relative: 11 %
MCH: 31 pg (ref 26.0–34.0)
MCHC: 33.7 g/dL (ref 32.0–36.0)
MCV: 92.1 fL (ref 80.0–100.0)
Monocytes Absolute: 1.1 10*3/uL — ABNORMAL HIGH (ref 0.2–1.0)
Monocytes Relative: 17 %
NEUTROS PCT: 69 %
Neutro Abs: 4.5 10*3/uL (ref 1.4–6.5)
Platelets: 158 10*3/uL (ref 150–440)
RBC: 3.09 MIL/uL — AB (ref 4.40–5.90)
RDW: 15.9 % — ABNORMAL HIGH (ref 11.5–14.5)
WBC: 6.4 10*3/uL (ref 3.8–10.6)

## 2016-07-27 LAB — APTT: APTT: 31 s (ref 24–36)

## 2016-07-27 LAB — BASIC METABOLIC PANEL
Anion gap: 13 (ref 5–15)
BUN: 72 mg/dL — ABNORMAL HIGH (ref 6–20)
CALCIUM: 8.4 mg/dL — AB (ref 8.9–10.3)
CHLORIDE: 103 mmol/L (ref 101–111)
CO2: 21 mmol/L — ABNORMAL LOW (ref 22–32)
CREATININE: 6.91 mg/dL — AB (ref 0.61–1.24)
GFR, EST AFRICAN AMERICAN: 9 mL/min — AB (ref >60)
GFR, EST NON AFRICAN AMERICAN: 8 mL/min — AB (ref >60)
Glucose, Bld: 87 mg/dL (ref 65–99)
Potassium: 5.1 mmol/L (ref 3.5–5.1)
SODIUM: 137 mmol/L (ref 135–145)

## 2016-07-27 LAB — PROTIME-INR
INR: 1.1
PROTHROMBIN TIME: 14.2 s (ref 11.4–15.2)

## 2016-07-27 LAB — SURGICAL PCR SCREEN
MRSA, PCR: NEGATIVE
STAPHYLOCOCCUS AUREUS: NEGATIVE

## 2016-07-27 NOTE — Pre-Procedure Instructions (Signed)
EKG CALLED TO DR Amie Critchley AND OK TO PROCEED

## 2016-07-27 NOTE — Patient Instructions (Signed)
  Your procedure is scheduled on: July 30, 2016 (Friday) Report to Same Day Surgery 2nd floor medical mall [facility] Entrance-take elevator on left to 2nd floor.  Check in with surgery information desk.) To find out your arrival time please call [phone] between 1PM - 3PM on July 29, 2016 (Thursday)   Remember: Instructions that are not followed completely may result in serious medical risk, up to and including death, or upon the discretion of your surgeon and anesthesiologist your surgery may need to be rescheduled.    _x___ 1. Do not eat food or drink liquids after midnight. No gum chewing or hard candies                             __x__ 2. No Alcohol for 24 hours before or after surgery.   __x__3. No Smoking for 24 prior to surgery.   ____  4. Bring all medications with you on the day of surgery if instructed.    __x__ 5. Notify your doctor if there is any change in your medical condition     (cold, fever, infections).     Do not wear jewelry, make-up, hairpins, clips or nail polish.  Do not wear lotions, powders, or perfumes.   Do not shave 48 hours prior to surgery. Men may shave face and neck.  Do not bring valuables to the hospital.    [facility] is not responsible for any belongings or valuables.               Contacts, dentures or bridgework may not be worn into surgery.  Leave your suitcase in the car. After surgery it may be brought to your room.  For patients admitted to the hospital, discharge time is determined by your  Treatment team                     Patients discharged the day of surgery will not be allowed to drive home.  You will need someone to drive you home and stay with you the night of your procedure.    Please read over the following fact sheets that you were given:   [facility] Preparing for Surgery and or MRSA Information   _x___ Take the following medications with a sip of water the morning of surgery:  1. CARVEDILOL  2. HydrALAZINE  3.  AVAPRO  4. LOSARTAN  5. ISOSORBIDE MONONITRATE  6.  ____Fleets enema or Magnesium Citrate as directed.   _x___ Use CHG Soap or sage wipes as directed on instruction sheet   _x__ Use inhalers on the day of surgery and bring to hospital day of surgery (USE ALBUTEROL AND ATROVENT INHALERS THE MORNING OF SURGERY AND BRING WITH YOU TO HOSPITAL)  ____ Stop Metformin and Janumet 2 days prior to surgery.    ____ Take 1/2 of usual insulin dose the night before surgery and none on the morning surgery    _x___ Follow recommendations from Cardiologist, Pulmonologist or PCP regarding          stopping Aspirin, Coumadin, Plavix ,Eliquis, Effient, or Pradaxa, and Pletal.  X____Stop Anti-inflammatories such as Advil, Aleve, Ibuprofen, Motrin, Naproxen, Naprosyn, Goodies powders or aspirin products. OK to take Tylenol   _x___ Stop supplements until after surgery.  But may continue Vitamin D, Vitamin B,and multivitamin       ____ Bring C-Pap to the hospital.

## 2016-07-29 MED ORDER — CEFAZOLIN SODIUM-DEXTROSE 2-4 GM/100ML-% IV SOLN: 2.0000 g | INTRAVENOUS | Status: AC

## 2016-07-30 ENCOUNTER — Ambulatory Visit
Admission: RE | Admit: 2016-07-30 | Discharge: 2016-07-30 | Disposition: A | Discharge status: Home / Self Care | Payer: Medicare Other | Source: Ambulatory Visit | Attending: Vascular Surgery | Admitting: Vascular Surgery

## 2016-07-30 ENCOUNTER — Encounter: Payer: Self-pay | Admitting: *Deleted

## 2016-07-30 ENCOUNTER — Encounter: Payer: Self-pay | Admitting: Anesthesiology

## 2016-07-30 ENCOUNTER — Encounter
Admission: RE | Disposition: A | Discharge status: Home / Self Care | Payer: Self-pay | Source: Ambulatory Visit | Attending: Vascular Surgery

## 2016-07-30 DIAGNOSIS — Z992 Dependence on renal dialysis: Secondary | ICD-10-CM | POA: Insufficient documentation

## 2016-07-30 DIAGNOSIS — N186 End stage renal disease: Secondary | ICD-10-CM | POA: Insufficient documentation

## 2016-07-30 DIAGNOSIS — T82898A Other specified complication of vascular prosthetic devices, implants and grafts, initial encounter: Secondary | ICD-10-CM | POA: Insufficient documentation

## 2016-07-30 DIAGNOSIS — I5089 Other heart failure: Secondary | ICD-10-CM | POA: Insufficient documentation

## 2016-07-30 DIAGNOSIS — Z01812 Encounter for preprocedural laboratory examination: Secondary | ICD-10-CM | POA: Insufficient documentation

## 2016-07-30 DIAGNOSIS — Z01810 Encounter for preprocedural cardiovascular examination: Secondary | ICD-10-CM | POA: Insufficient documentation

## 2016-07-30 DIAGNOSIS — E1122 Type 2 diabetes mellitus with diabetic chronic kidney disease: Secondary | ICD-10-CM | POA: Insufficient documentation

## 2016-07-30 DIAGNOSIS — Z538 Procedure and treatment not carried out for other reasons: Secondary | ICD-10-CM | POA: Insufficient documentation

## 2016-07-30 DIAGNOSIS — I132 Hypertensive heart and chronic kidney disease with heart failure and with stage 5 chronic kidney disease, or end stage renal disease: Secondary | ICD-10-CM | POA: Insufficient documentation

## 2016-07-30 DIAGNOSIS — Z7984 Long term (current) use of oral hypoglycemic drugs: Secondary | ICD-10-CM | POA: Insufficient documentation

## 2016-07-30 DIAGNOSIS — Z79899 Other long term (current) drug therapy: Secondary | ICD-10-CM | POA: Insufficient documentation

## 2016-07-30 LAB — POCT I-STAT 4, (NA,K, GLUC, HGB,HCT)
Glucose, Bld: 87 mg/dL (ref 65–99)
HCT: 35 % — ABNORMAL LOW (ref 39.0–52.0)
HEMOGLOBIN: 11.9 g/dL — AB (ref 13.0–17.0)
Potassium: 4.4 mmol/L (ref 3.5–5.1)
Sodium: 139 mmol/L (ref 135–145)

## 2016-07-30 LAB — URINE DRUG SCREEN, QUALITATIVE (ARMC ONLY)
AMPHETAMINES, UR SCREEN: NOT DETECTED
BENZODIAZEPINE, UR SCRN: NOT DETECTED
Barbiturates, Ur Screen: NOT DETECTED
CANNABINOID 50 NG, UR ~~LOC~~: NOT DETECTED
Cocaine Metabolite,Ur ~~LOC~~: POSITIVE — AB
MDMA (Ecstasy)Ur Screen: NOT DETECTED
Methadone Scn, Ur: NOT DETECTED
Opiate, Ur Screen: NOT DETECTED
PHENCYCLIDINE (PCP) UR S: NOT DETECTED
Tricyclic, Ur Screen: NOT DETECTED

## 2016-07-30 SURGERY — TRANSPOSITION, VEIN, BASILIC
Anesthesia: General | Laterality: Left

## 2016-07-30 MED ORDER — SODIUM CHLORIDE 0.9 % IV SOLN
INTRAVENOUS | Status: DC
  Administered 2016-07-30: 07:00:00 via INTRAVENOUS

## 2016-07-30 MED ORDER — CHLORHEXIDINE GLUCONATE CLOTH 2 % EX PADS: 6.0000 | MEDICATED_PAD | Freq: Once | CUTANEOUS | Status: DC

## 2016-07-30 MED ORDER — CEFAZOLIN SODIUM-DEXTROSE 2-4 GM/100ML-% IV SOLN
INTRAVENOUS | Status: AC
  Filled 2016-07-30: qty 100

## 2016-07-30 MED ORDER — FAMOTIDINE 20 MG PO TABS
ORAL_TABLET | ORAL | Status: AC
  Administered 2016-07-30: 20 mg via ORAL
  Filled 2016-07-30: qty 1

## 2016-07-30 MED ORDER — FAMOTIDINE 20 MG PO TABS
20.0000 mg | ORAL_TABLET | Freq: Once | ORAL | Status: AC
  Administered 2016-07-30: 20 mg via ORAL

## 2016-07-30 SURGICAL SUPPLY — 55 items
APPLIER CLIP 11 MED OPEN (CLIP)
APPLIER CLIP 9.375 SM OPEN (CLIP)
BAG DECANTER FOR FLEXI CONT (MISCELLANEOUS) ×3 IMPLANT
BLADE SURG 15 STRL LF DISP TIS (BLADE) ×1 IMPLANT
BLADE SURG 15 STRL SS (BLADE) ×2
BLADE SURG SZ11 CARB STEEL (BLADE) ×3 IMPLANT
BOOT SUTURE AID YELLOW STND (SUTURE) ×3 IMPLANT
BRUSH SCRUB 4% CHG (MISCELLANEOUS) ×3 IMPLANT
CANISTER SUCT 1200ML W/VALVE (MISCELLANEOUS) ×3 IMPLANT
CHLORAPREP W/TINT 26ML (MISCELLANEOUS) ×3 IMPLANT
CLIP APPLIE 11 MED OPEN (CLIP) IMPLANT
CLIP APPLIE 9.375 SM OPEN (CLIP) IMPLANT
DECANTER SPIKE VIAL GLASS SM (MISCELLANEOUS) ×3 IMPLANT
DERMABOND ADVANCED (GAUZE/BANDAGES/DRESSINGS) ×2
DERMABOND ADVANCED .7 DNX12 (GAUZE/BANDAGES/DRESSINGS) ×1 IMPLANT
DRESSING SURGICEL FIBRLLR 1X2 (HEMOSTASIS) ×1 IMPLANT
DRSG SURGICEL FIBRILLAR 1X2 (HEMOSTASIS) ×3
ELECT CAUTERY BLADE 6.4 (BLADE) ×3 IMPLANT
ELECT REM PT RETURN 9FT ADLT (ELECTROSURGICAL) ×3
ELECTRODE REM PT RTRN 9FT ADLT (ELECTROSURGICAL) ×1 IMPLANT
GEL ULTRASOUND 20GR AQUASONIC (MISCELLANEOUS) ×3 IMPLANT
GLOVE BIO SURGEON STRL SZ7 (GLOVE) ×3 IMPLANT
GLOVE INDICATOR 7.5 STRL GRN (GLOVE) ×3 IMPLANT
GLOVE SURG SYN 8.0 (GLOVE) ×3 IMPLANT
GOWN L4 XLG 20 PK N/S (GOWN DISPOSABLE) ×3 IMPLANT
GOWN STRL REUS W/ TWL LRG LVL3 (GOWN DISPOSABLE) ×2 IMPLANT
GOWN STRL REUS W/TWL LRG LVL3 (GOWN DISPOSABLE) ×4
IV NS 500ML (IV SOLUTION) ×2
IV NS 500ML BAXH (IV SOLUTION) ×1 IMPLANT
KIT RM TURNOVER STRD PROC AR (KITS) ×3 IMPLANT
LABEL OR SOLS (LABEL) ×3 IMPLANT
LOOP RED MAXI  1X406MM (MISCELLANEOUS) ×2
LOOP VESSEL MAXI 1X406 RED (MISCELLANEOUS) ×1 IMPLANT
LOOP VESSEL MINI 0.8X406 BLUE (MISCELLANEOUS) ×2 IMPLANT
LOOPS BLUE MINI 0.8X406MM (MISCELLANEOUS) ×4
NEEDLE FILTER BLUNT 18X 1/2SAF (NEEDLE) ×2
NEEDLE FILTER BLUNT 18X1 1/2 (NEEDLE) ×1 IMPLANT
PACK EXTREMITY ARMC (MISCELLANEOUS) ×3 IMPLANT
PAD PREP 24X41 OB/GYN DISP (PERSONAL CARE ITEMS) ×3 IMPLANT
STOCKINETTE 4 (MISCELLANEOUS) ×3 IMPLANT
STOCKINETTE STRL 4IN 9604848 (GAUZE/BANDAGES/DRESSINGS) ×3 IMPLANT
SUT MNCRL+ 5-0 UNDYED PC-3 (SUTURE) ×2 IMPLANT
SUT MONOCRYL 5-0 (SUTURE) ×4
SUT PROLENE 6 0 BV (SUTURE) ×12 IMPLANT
SUT SILK 2 0 (SUTURE) ×2
SUT SILK 2 0 SH (SUTURE) ×3 IMPLANT
SUT SILK 2-0 18XBRD TIE 12 (SUTURE) ×1 IMPLANT
SUT SILK 3 0 (SUTURE) ×2
SUT SILK 3-0 18XBRD TIE 12 (SUTURE) ×1 IMPLANT
SUT SILK 4 0 (SUTURE) ×2
SUT SILK 4-0 18XBRD TIE 12 (SUTURE) ×1 IMPLANT
SUT VIC AB 3-0 SH 27 (SUTURE) ×6
SUT VIC AB 3-0 SH 27X BRD (SUTURE) ×3 IMPLANT
SYR 20CC LL (SYRINGE) ×3 IMPLANT
SYR 3ML LL SCALE MARK (SYRINGE) ×3 IMPLANT

## 2016-08-03 ENCOUNTER — Ambulatory Visit: Payer: Medicare Other | Admitting: Internal Medicine

## 2016-08-22 ENCOUNTER — Emergency Department: Payer: Medicare Other

## 2016-08-22 ENCOUNTER — Inpatient Hospital Stay
Admission: EM | Admit: 2016-08-22 | Discharge: 2016-08-24 | DRG: 291 | Disposition: A | Discharge status: Home / Self Care | Payer: Medicare Other | Attending: Internal Medicine | Admitting: Internal Medicine

## 2016-08-22 DIAGNOSIS — B1920 Unspecified viral hepatitis C without hepatic coma: Secondary | ICD-10-CM | POA: Diagnosis present

## 2016-08-22 DIAGNOSIS — Z79899 Other long term (current) drug therapy: Secondary | ICD-10-CM | POA: Diagnosis not present

## 2016-08-22 DIAGNOSIS — F17210 Nicotine dependence, cigarettes, uncomplicated: Secondary | ICD-10-CM | POA: Diagnosis present

## 2016-08-22 DIAGNOSIS — Z8249 Family history of ischemic heart disease and other diseases of the circulatory system: Secondary | ICD-10-CM

## 2016-08-22 DIAGNOSIS — I160 Hypertensive urgency: Secondary | ICD-10-CM | POA: Diagnosis present

## 2016-08-22 DIAGNOSIS — D631 Anemia in chronic kidney disease: Secondary | ICD-10-CM | POA: Diagnosis present

## 2016-08-22 DIAGNOSIS — J810 Acute pulmonary edema: Secondary | ICD-10-CM

## 2016-08-22 DIAGNOSIS — N186 End stage renal disease: Secondary | ICD-10-CM | POA: Diagnosis present

## 2016-08-22 DIAGNOSIS — Z7982 Long term (current) use of aspirin: Secondary | ICD-10-CM

## 2016-08-22 DIAGNOSIS — J449 Chronic obstructive pulmonary disease, unspecified: Secondary | ICD-10-CM | POA: Diagnosis present

## 2016-08-22 DIAGNOSIS — I272 Pulmonary hypertension, unspecified: Secondary | ICD-10-CM | POA: Diagnosis present

## 2016-08-22 DIAGNOSIS — I132 Hypertensive heart and chronic kidney disease with heart failure and with stage 5 chronic kidney disease, or end stage renal disease: Principal | ICD-10-CM | POA: Diagnosis present

## 2016-08-22 DIAGNOSIS — N2581 Secondary hyperparathyroidism of renal origin: Secondary | ICD-10-CM | POA: Diagnosis present

## 2016-08-22 DIAGNOSIS — G40909 Epilepsy, unspecified, not intractable, without status epilepticus: Secondary | ICD-10-CM | POA: Diagnosis present

## 2016-08-22 DIAGNOSIS — I739 Peripheral vascular disease, unspecified: Secondary | ICD-10-CM | POA: Diagnosis present

## 2016-08-22 DIAGNOSIS — Z992 Dependence on renal dialysis: Secondary | ICD-10-CM | POA: Diagnosis not present

## 2016-08-22 DIAGNOSIS — I5043 Acute on chronic combined systolic (congestive) and diastolic (congestive) heart failure: Secondary | ICD-10-CM | POA: Diagnosis present

## 2016-08-22 DIAGNOSIS — E8770 Fluid overload, unspecified: Secondary | ICD-10-CM

## 2016-08-22 LAB — COMPREHENSIVE METABOLIC PANEL
ALBUMIN: 3.6 g/dL (ref 3.5–5.0)
ALT: 22 U/L (ref 17–63)
AST: 29 U/L (ref 15–41)
Alkaline Phosphatase: 79 U/L (ref 38–126)
Anion gap: 13 (ref 5–15)
BUN: 62 mg/dL — AB (ref 6–20)
CO2: 24 mmol/L (ref 22–32)
CREATININE: 8.27 mg/dL — AB (ref 0.61–1.24)
Calcium: 9.5 mg/dL (ref 8.9–10.3)
Chloride: 100 mmol/L — ABNORMAL LOW (ref 101–111)
GFR calc Af Amer: 7 mL/min — ABNORMAL LOW (ref >60)
GFR calc non Af Amer: 6 mL/min — ABNORMAL LOW (ref >60)
GLUCOSE: 121 mg/dL — AB (ref 65–99)
POTASSIUM: 4.5 mmol/L (ref 3.5–5.1)
Sodium: 137 mmol/L (ref 135–145)
Total Bilirubin: 0.8 mg/dL (ref 0.3–1.2)
Total Protein: 7.5 g/dL (ref 6.5–8.1)

## 2016-08-22 LAB — CBC WITH DIFFERENTIAL/PLATELET
BASOS ABS: 0.1 10*3/uL (ref 0–0.1)
Basophils Relative: 2 %
EOS PCT: 2 %
Eosinophils Absolute: 0.1 10*3/uL (ref 0–0.7)
HEMATOCRIT: 30.7 % — AB (ref 40.0–52.0)
Hemoglobin: 10.1 g/dL — ABNORMAL LOW (ref 13.0–18.0)
LYMPHS PCT: 14 %
Lymphs Abs: 0.8 10*3/uL — ABNORMAL LOW (ref 1.0–3.6)
MCH: 30.7 pg (ref 26.0–34.0)
MCHC: 32.9 g/dL (ref 32.0–36.0)
MCV: 93.1 fL (ref 80.0–100.0)
MONO ABS: 0.8 10*3/uL (ref 0.2–1.0)
Monocytes Relative: 14 %
NEUTROS ABS: 3.6 10*3/uL (ref 1.4–6.5)
Neutrophils Relative %: 68 %
PLATELETS: 163 10*3/uL (ref 150–440)
RBC: 3.29 MIL/uL — AB (ref 4.40–5.90)
RDW: 16.6 % — AB (ref 11.5–14.5)
WBC: 5.3 10*3/uL (ref 3.8–10.6)

## 2016-08-22 LAB — TROPONIN I
TROPONIN I: 0.18 ng/mL — AB (ref <0.03)
TROPONIN I: 0.17 ng/mL — AB (ref <0.03)
Troponin I: 0.18 ng/mL (ref <0.03)

## 2016-08-22 LAB — MRSA PCR SCREENING: MRSA BY PCR: NEGATIVE

## 2016-08-22 LAB — MAGNESIUM: Magnesium: 2 mg/dL (ref 1.7–2.4)

## 2016-08-22 LAB — BRAIN NATRIURETIC PEPTIDE

## 2016-08-22 MED ORDER — BISACODYL 5 MG PO TBEC: 5.0000 mg | DELAYED_RELEASE_TABLET | Freq: Every day | ORAL | Status: DC | PRN

## 2016-08-22 MED ORDER — SODIUM CHLORIDE 0.9% FLUSH
3.0000 mL | Freq: Two times a day (BID) | INTRAVENOUS | Status: DC
  Administered 2016-08-22 – 2016-08-24 (×4): 3 mL via INTRAVENOUS

## 2016-08-22 MED ORDER — CALCIUM ACETATE (PHOS BINDER) 667 MG PO CAPS
2001.0000 mg | ORAL_CAPSULE | Freq: Three times a day (TID) | ORAL | Status: DC
  Administered 2016-08-22 – 2016-08-24 (×4): 2001 mg via ORAL
  Filled 2016-08-22 (×4): qty 3

## 2016-08-22 MED ORDER — HYDRALAZINE HCL 50 MG PO TABS
50.0000 mg | ORAL_TABLET | Freq: Once | ORAL | Status: AC
  Administered 2016-08-22: 50 mg via ORAL
  Filled 2016-08-22: qty 1

## 2016-08-22 MED ORDER — NITROGLYCERIN 2 % TD OINT
1.0000 [in_us] | TOPICAL_OINTMENT | Freq: Four times a day (QID) | TRANSDERMAL | Status: DC
  Administered 2016-08-22 – 2016-08-24 (×5): 1 [in_us] via TOPICAL
  Filled 2016-08-22 (×6): qty 1

## 2016-08-22 MED ORDER — SENNOSIDES-DOCUSATE SODIUM 8.6-50 MG PO TABS: 1.0000 | ORAL_TABLET | Freq: Every evening | ORAL | Status: DC | PRN

## 2016-08-22 MED ORDER — ALBUTEROL SULFATE (2.5 MG/3ML) 0.083% IN NEBU
2.5000 mg | INHALATION_SOLUTION | RESPIRATORY_TRACT | Status: DC | PRN
  Administered 2016-08-22: 2.5 mg via RESPIRATORY_TRACT
  Filled 2016-08-22: qty 3

## 2016-08-22 MED ORDER — CARVEDILOL 6.25 MG PO TABS
6.2500 mg | ORAL_TABLET | Freq: Two times a day (BID) | ORAL | Status: DC
  Administered 2016-08-22 – 2016-08-24 (×3): 6.25 mg via ORAL
  Filled 2016-08-22 (×3): qty 1

## 2016-08-22 MED ORDER — ASPIRIN 81 MG PO CHEW
324.0000 mg | CHEWABLE_TABLET | Freq: Once | ORAL | Status: AC
  Administered 2016-08-22: 324 mg via ORAL
  Filled 2016-08-22: qty 4

## 2016-08-22 MED ORDER — HYDROCODONE-ACETAMINOPHEN 5-325 MG PO TABS
1.0000 | ORAL_TABLET | ORAL | Status: DC | PRN
  Administered 2016-08-23: 1 via ORAL
  Filled 2016-08-22: qty 1

## 2016-08-22 MED ORDER — ACETAMINOPHEN 325 MG PO TABS
650.0000 mg | ORAL_TABLET | Freq: Four times a day (QID) | ORAL | Status: DC | PRN
  Administered 2016-08-23: 650 mg via ORAL
  Filled 2016-08-22: qty 2

## 2016-08-22 MED ORDER — HYDRALAZINE HCL 20 MG/ML IJ SOLN: 10.0000 mg | Freq: Four times a day (QID) | INTRAMUSCULAR | Status: DC | PRN

## 2016-08-22 MED ORDER — SODIUM CHLORIDE 0.9% FLUSH: 3.0000 mL | INTRAVENOUS | Status: DC | PRN

## 2016-08-22 MED ORDER — ASPIRIN EC 81 MG PO TBEC
81.0000 mg | DELAYED_RELEASE_TABLET | Freq: Every day | ORAL | Status: DC
  Administered 2016-08-23 – 2016-08-24 (×2): 81 mg via ORAL
  Filled 2016-08-22 (×2): qty 1

## 2016-08-22 MED ORDER — LOSARTAN POTASSIUM 25 MG PO TABS
25.0000 mg | ORAL_TABLET | Freq: Every day | ORAL | Status: DC
  Administered 2016-08-22 – 2016-08-24 (×3): 25 mg via ORAL
  Filled 2016-08-22 (×3): qty 1

## 2016-08-22 MED ORDER — CINACALCET HCL 30 MG PO TABS
90.0000 mg | ORAL_TABLET | Freq: Every day | ORAL | Status: DC
  Administered 2016-08-23 – 2016-08-24 (×2): 90 mg via ORAL
  Filled 2016-08-22 (×4): qty 3

## 2016-08-22 MED ORDER — FUROSEMIDE 10 MG/ML IJ SOLN
40.0000 mg | Freq: Two times a day (BID) | INTRAMUSCULAR | Status: DC
  Administered 2016-08-22 – 2016-08-24 (×4): 40 mg via INTRAVENOUS
  Filled 2016-08-22 (×5): qty 4

## 2016-08-22 MED ORDER — SODIUM CHLORIDE 0.9 % IV SOLN: 250.0000 mL | INTRAVENOUS | Status: DC | PRN

## 2016-08-22 MED ORDER — HEPARIN SODIUM (PORCINE) 5000 UNIT/ML IJ SOLN
5000.0000 [IU] | Freq: Three times a day (TID) | INTRAMUSCULAR | Status: DC
  Administered 2016-08-22 – 2016-08-24 (×5): 5000 [IU] via SUBCUTANEOUS
  Filled 2016-08-22 (×6): qty 1

## 2016-08-22 MED ORDER — DEXTROSE 5 % IV SOLN
120.0000 mg | Freq: Once | INTRAVENOUS | Status: AC
  Administered 2016-08-22: 120 mg via INTRAVENOUS
  Filled 2016-08-22: qty 10

## 2016-08-22 MED ORDER — HYDRALAZINE HCL 20 MG/ML IJ SOLN
10.0000 mg | Freq: Once | INTRAMUSCULAR | Status: AC
  Administered 2016-08-22: 10 mg via INTRAVENOUS
  Filled 2016-08-22: qty 1

## 2016-08-22 MED ORDER — ISOSORBIDE MONONITRATE ER 30 MG PO TB24
30.0000 mg | ORAL_TABLET | Freq: Every day | ORAL | Status: DC
  Administered 2016-08-22 – 2016-08-24 (×3): 30 mg via ORAL
  Filled 2016-08-22 (×3): qty 1

## 2016-08-22 MED ORDER — HYDRALAZINE HCL 50 MG PO TABS
50.0000 mg | ORAL_TABLET | Freq: Three times a day (TID) | ORAL | Status: DC
  Administered 2016-08-22 – 2016-08-24 (×4): 50 mg via ORAL
  Filled 2016-08-22 (×4): qty 1

## 2016-08-22 MED ORDER — ONDANSETRON HCL 4 MG/2ML IJ SOLN
4.0000 mg | Freq: Four times a day (QID) | INTRAMUSCULAR | Status: DC | PRN
  Administered 2016-08-23: 4 mg via INTRAVENOUS
  Filled 2016-08-22: qty 2

## 2016-08-22 MED ORDER — ONDANSETRON HCL 4 MG PO TABS: 4.0000 mg | ORAL_TABLET | Freq: Four times a day (QID) | ORAL | Status: DC | PRN

## 2016-08-22 MED ORDER — GUAIFENESIN 100 MG/5ML PO SOLN
5.0000 mL | ORAL | Status: DC | PRN
Start: 2016-08-22 — End: 2016-08-24
  Filled 2016-08-22: qty 5

## 2016-08-22 MED ORDER — CALCIUM CARBONATE ANTACID 500 MG PO CHEW
2.0000 | CHEWABLE_TABLET | Freq: Three times a day (TID) | ORAL | Status: DC
  Administered 2016-08-22 – 2016-08-24 (×4): 400 mg via ORAL
  Filled 2016-08-22 (×7): qty 2

## 2016-08-22 MED ORDER — CALCIUM ACETATE (PHOS BINDER) 667 MG PO CAPS
1334.0000 mg | ORAL_CAPSULE | Freq: Every day | ORAL | Status: DC
  Administered 2016-08-23: 1334 mg via ORAL
  Filled 2016-08-22: qty 2

## 2016-08-22 MED ORDER — NITROGLYCERIN 0.4 MG SL SUBL
0.4000 mg | SUBLINGUAL_TABLET | SUBLINGUAL | Status: AC
  Administered 2016-08-22: 0.4 mg via SUBLINGUAL

## 2016-08-22 MED ORDER — ACETAMINOPHEN 650 MG RE SUPP: 650.0000 mg | Freq: Four times a day (QID) | RECTAL | Status: DC | PRN

## 2016-08-22 MED ORDER — NITROGLYCERIN 0.4 MG SL SUBL
0.4000 mg | SUBLINGUAL_TABLET | SUBLINGUAL | Status: AC
  Administered 2016-08-22: 0.4 mg via SUBLINGUAL
  Filled 2016-08-22: qty 1

## 2016-08-22 NOTE — ED Notes (Signed)
Family at bedside. 

## 2016-08-22 NOTE — ED Triage Notes (Signed)
Pt came to ED via EMS c/o sob for past few days, starting after dialysis. History of htn, bp 166/106 per EMS. CBG 177. 99% 3 l [place]

## 2016-08-22 NOTE — H&P (Signed)
[facility] [place] NAME: [name]    MR#:  [id number]  DATE OF BIRTH:  21-Dec-1955  DATE OF ADMISSION:  08/22/2016  PRIMARY CARE PHYSICIAN: [facility] [place]   REQUESTING/REFERRING PHYSICIAN: Delman Kitten, MD  CHIEF COMPLAINT:   Chief Complaint  Patient presents with  . Shortness of Breath   Cough, sputum and shortness of breath for 3 days. HISTORY OF PRESENT ILLNESS:  [name]  is a 61 y.o. male with a known history of Chronic systolic and diastolic CHF, ESRD on hemodialysis, hepatitis, hypertension, PVD, hypertension and arthritis. The patient presented the ED with the above chief complaints. She got hemodialysis last Friday. But has had worsening cough, sputum and shortness breath for the past 3 days. He also complains of leg swelling, orthopnea and nocturnal dyspnea. Chest x-ray show vascular congestion. BNP is more than 4500.  PAST MEDICAL HISTORY:   Past Medical History:  Diagnosis Date  . Arthritis   . Chest pain    a. 09/2013 [facility]): basal inf defect more pronounced @ rest, likely artifact, EF 48%, low risk study.  . Chronic combined systolic and diastolic CHF (congestive heart failure) ([place])    a. 10/2013 [facility]): EF 50%, diast dysfxn, triv AI/TR, mild MR;  b. 10/2014 Echo: EF 30-35%, diff HK, gr1 DD, mild AI, mild to mod MR/TR, mod dil LA, nl RV, mod to sev PAH;  c. 04/2016 Echo: EF 20-25%, sev dil LV, diff HK, gr3 DD, mod AI, MV syst bowing w/ prolapse, mod dil RV, mod TR.  [name] D[facility] patient ([place])    Mon, Wed, Fri  . Dyspnea   . ESRD (end stage renal disease) ([place])    a. MWF dialysis @ [facility].  . Hepatitis    Patient is unsure type of Hepatitis he has  . Hypertension   . Hypertensive heart disease with CHF (congestive heart failure) ([place])   . Peripheral vascular disease ([place])   . Pulmonary hypertension ([place]) 04/2016  . Renal insufficiency     PAST SURGICAL HISTORY:    Past Surgical History:  Procedure Laterality Date  . A/V SHUNT INTERVENTION N/A 04/06/2016   Procedure: A/V Shunt Intervention;  Surgeon: Katha Cabal, MD;  Location: [place] [facility];  Service: Cardiovascular;  Laterality: N/A;  . A/V SHUNT INTERVENTION N/A 06/29/2016   Procedure: A/V Shunt Intervention;  Surgeon: Katha Cabal, MD;  Location: [place] [facility];  Service: Cardiovascular;  Laterality: N/A;  . A/V SHUNTOGRAM Left 04/06/2016   Procedure: A/V Fistulagram;  Surgeon: Katha Cabal, MD;  Location: [place] [facility];  Service: Cardiovascular;  Laterality: Left;  . AV FISTULA PLACEMENT Left 2014  . DIALYSIS/PERMA CATHETER INSERTION Right    Dr. Delana Meyer  . PERIPHERAL VASCULAR CATHETERIZATION N/A 10/03/2014   Procedure: A/V Shuntogram/Fistulagram;  Surgeon: Algernon Huxley, MD;  Location: [place] [facility];  Service: Cardiovascular;  Laterality: N/A;  . PERIPHERAL VASCULAR CATHETERIZATION Left 10/03/2014   Procedure: A/V Shunt Intervention;  Surgeon: Algernon Huxley, MD;  Location: [place] [facility];  Service: Cardiovascular;  Laterality: Left;  . PERIPHERAL VASCULAR CATHETERIZATION Left 05/01/2015   Procedure: A/V Shuntogram/Fistulagram;  Surgeon: Algernon Huxley, MD;  Location: [place] [facility];  Service: Cardiovascular;  Laterality: Left;  . PERIPHERAL VASCULAR CATHETERIZATION N/A 05/01/2015   Procedure: A/V Shunt Intervention;  Surgeon: Algernon Huxley, MD;  Location: [place] [facility];  Service: Cardiovascular;  Laterality: N/A;  . UPPER EXTREMITY ANGIOGRAPHY Bilateral 06/29/2016   Procedure: Upper Extremity Angiography;  Surgeon: Katha Cabal, MD;  Location: [place] [facility];  Service: Cardiovascular;  Laterality: Bilateral;  . UPPER EXTREMITY INTERVENTION  06/29/2016   Procedure: Upper Extremity Intervention;  Surgeon: Katha Cabal, MD;  Location: [place] [facility];  Service: Cardiovascular;;    SOCIAL HISTORY:   Social History  Substance Use  Topics  . Smoking status: Current Some Day Smoker    Packs/day: 0.25    Years: 40.00    Types: Cigarettes  . Smokeless tobacco: Never Used  . Alcohol use No    FAMILY HISTORY:   Family History  Problem Relation Age of Onset  . Hypertension Son   . Diabetes Son   . Hypertension Mother   . Diabetes Mother   . Diabetes Sister     DRUG ALLERGIES:   Allergies  Allergen Reactions  . Sulfa Antibiotics Rash    REVIEW OF SYSTEMS:   Review of Systems  Constitutional: Positive for malaise/fatigue. Negative for chills and fever.  HENT: Negative for congestion and sore throat.   Eyes: Negative for blurred vision and double vision.  Respiratory: Positive for cough, sputum production and shortness of breath. Negative for hemoptysis, wheezing and stridor.   Cardiovascular: Positive for chest pain, orthopnea and leg swelling. Negative for palpitations.  Gastrointestinal: Negative for abdominal pain, blood in stool, diarrhea, melena, nausea and vomiting.  Genitourinary: Negative for dysuria and hematuria.  Musculoskeletal: Negative for back pain.  Neurological: Positive for weakness. Negative for dizziness, focal weakness, loss of consciousness and headaches.  Psychiatric/Behavioral: Negative for depression. The patient is nervous/anxious.     MEDICATIONS AT HOME:   Prior to Admission medications   Medication Sig Start Date End Date Taking? Authorizing Provider  aspirin EC 81 MG tablet Take 1 tablet (81 mg total) by mouth daily. 05/20/16  Yes End, Harrell Gave, MD  calcium acetate (PHOSLO) 667 MG capsule Take 1,334-2,001 mg by mouth 3 (three) times daily with meals. Take 2001 mgs with meals 3 times daily and 1334 with snacks 12/11/15  Yes [provider]  Calcium Carbonate Antacid (TUMS ULTRA 1000 PO) Take 2 tablets by mouth 3 (three) times daily before meals.   Yes [provider]  cinacalcet (SENSIPAR) 90 MG tablet Take 90 mg by mouth daily.   Yes [provider]  hydrALAZINE (APRESOLINE) 50 MG tablet Take 100 tablets by mouth 3 (three) times daily.  07/01/16  Yes [provider]  losartan (COZAAR) 25 MG tablet Take 25 mg by mouth daily.  06/17/16  Yes [provider]  albuterol (PROVENTIL HFA;VENTOLIN HFA) 108 (90 Base) MCG/ACT inhaler Inhale into the lungs every 4 (four) hours as needed for wheezing or shortness of breath.    [provider]  carvedilol (COREG) 6.25 MG tablet Take 1 tablet (6.25 mg total) by mouth 2 (two) times daily with a meal. 05/01/16   Bettey Costa, MD  ipratropium (ATROVENT HFA) 17 MCG/ACT inhaler Inhale 2 puffs into the lungs every 4 (four) hours as needed for wheezing.    [provider]  isosorbide mononitrate (IMDUR) 30 MG 24 hr tablet Take 1 tablet (30 mg total) by mouth daily. 05/20/16 08/18/16  End, Harrell Gave, MD  lidocaine-prilocaine (EMLA) cream Apply 1 application topically as needed (dialysis access).    [provider]      VITAL SIGNS:  Blood pressure (!) 171/109, pulse 83, resp. rate (!) 25, height 5'  8" (1.727 m), weight 156 lb (70.8 kg), SpO2 96 %.  PHYSICAL EXAMINATION:  Physical Exam  GENERAL:  61 y.o.-year-old patient lying in the bed with no acute distress.  EYES: Pupils equal, round, reactive to light and accommodation. No scleral icterus. Extraocular muscles intact.  HEENT: Head atraumatic, normocephalic. Oropharynx and nasopharynx clear.  NECK:  Supple, no jugular venous distention. No thyroid enlargement, no tenderness.  LUNGS: no wheezing,But has basilar rales. No use of accessory muscles of respiration.  CARDIOVASCULAR: S1, S2 normal. No murmurs, rubs, or gallops.  ABDOMEN: Soft, nontender, nondistended. Bowel sounds present. No organomegaly or mass.  EXTREMITIES: Bilateral leg edema 1+, no cyanosis, or clubbing.  NEUROLOGIC: Cranial nerves II through XII are intact. Muscle strength 5/5 in all extremities. Sensation intact. Gait not checked.    PSYCHIATRIC: The patient is alert and oriented x 3.  SKIN: No obvious rash, lesion, or ulcer.  LABORATORY PANEL:   CBC  Recent Labs Lab 08/22/16 1146  WBC 5.3  HGB 10.1*  HCT 30.7*  PLT 163   ------------------------------------------------------------------------------------------------------------------  Chemistries   Recent Labs Lab 08/22/16 1146  NA 137  K 4.5  CL 100*  CO2 24  GLUCOSE 121*  BUN 62*  CREATININE 8.27*  CALCIUM 9.5  AST 29  ALT 22  ALKPHOS 79  BILITOT 0.8   ------------------------------------------------------------------------------------------------------------------  Cardiac Enzymes  Recent Labs Lab 08/22/16 1146  TROPONINI 0.18*   ------------------------------------------------------------------------------------------------------------------  RADIOLOGY:  Dg Chest Portable 1 View  Result Date: 08/22/2016 CLINICAL DATA:  End-stage renal disease. EXAM: PORTABLE CHEST 1 VIEW COMPARISON:  07/13/2016 FINDINGS: Right dialysis catheter remains in place, unchanged. Cardiomegaly, vascular congestion. No confluent opacities, effusions or overt edema. No acute bony abnormality. IMPRESSION: Cardiomegaly, vascular congestion. Electronically Signed   By: Rolm Baptise M.D.   On: 08/22/2016 12:31      IMPRESSION AND PLAN:   Acute on chronic systolic and diastolic CHF. Ejection fraction 20-25%. The patient will be admitted to telemetry floor. He was given Lasix 120 mg IV in the ED. Continue Lasix 40 mg IV twice a day, hemodialysis tomorrow. NEB when necessary.  Elevated troponin. Due to demanding ischemia. Follow-up troponin level, continue aspirin.  Accelerated hypertension. Continue home hypertension medication, start hydralazine IV when necessary.  ESRD. Continue hemodialysis as scheduled. Follow-up nephrology consult.  All the records are reviewed and case discussed with ED provider. Management plans discussed with the patient, family  and they are in agreement.  CODE STATUS: Full code  TOTAL TIME TAKING CARE OF THIS PATIENT: 57 minutes.    Demetrios Loll M.D on 08/22/2016 at 4:24 PM  Between 7am to 6pm - Pager - [phone]  After 6pm go to [email or web address] - [employer] brewer [facility]  Office  [phone]  CC: Primary care physician; [facility] [place]   Note: This dictation was prepared with Diplomatic Services operational officer dictation along with smaller phrase technology. Any transcriptional errors that result from this process are unintentional.

## 2016-08-22 NOTE — ED Provider Notes (Signed)
[facility] Emergency Department Provider Note   ____________________________________________   First MD Initiated Contact with Patient 08/22/16 1140     (approximate)  I have reviewed the triage vital signs and the nursing notes.   HISTORY  Chief Complaint Shortness of Breath    HPI [name] is a 61 y.o. male ports increasing shortness of breath 3 days. Last had dialysis on Friday. 40 began getting progressively worsening shortness of breath, mostly notable in the night, but now very short of breath even during the day. Reports swelling in his lower legs. Reports he's taking his medications with no relief.  Began having chest pain today as well, described as a tightness in the chest. Nonradiating. No nausea or vomiting. No fevers or chills. Denies cough.  mild tightness across the chest.  Past Medical History:  Diagnosis Date  . Arthritis   . Chest pain    a. 09/2013 [facility]): basal inf defect more pronounced @ rest, likely artifact, EF 48%, low risk study.  . Chronic combined systolic and diastolic CHF (congestive heart failure) ([place])    a. 10/2013 Echo [facility]): EF 50%, diast dysfxn, triv AI/TR, mild MR;  b. 10/2014 Echo: EF 30-35%, diff HK, gr1 DD, mild AI, mild to mod MR/TR, mod dil LA, nl RV, mod to sev PAH;  c. 04/2016 Echo: EF 20-25%, sev dil LV, diff HK, gr3 DD, mod AI, MV syst bowing w/ prolapse, mod dil RV, mod TR.  [name] Dialysis patient ([place])    Mon, Wed, Fri  . Dyspnea   . ESRD (end stage renal disease) ([place])    a. MWF dialysis @ [facility].  . Hepatitis    Patient is unsure type of Hepatitis he has  . Hypertension   . Hypertensive heart disease with CHF (congestive heart failure) ([place])   . Peripheral vascular disease ([place])   . Pulmonary hypertension ([place]) 04/2016  . Renal insufficiency     Patient Active Problem List   Diagnosis Date Noted  . 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 Hyperkalemia 07/04/2016  . Abnormal EKG  07/04/2016  . Elevated troponin   . Acute respiratory failure with hypoxia ([place]) 06/06/2016  . Chest pain 05/22/2016  . Sepsis ([place]) 02/26/2016  . HCAP (healthcare-associated pneumonia) 02/26/2016  . Diabetes ([place]) 02/26/2016  . Community acquired pneumonia 01/07/2016  . Renal dialysis device, implant, or graft complication 76/19/5093  . Tobacco use 09/04/2015  . ESRD (end stage renal disease) on dialysis ([place]) 08/02/2015  . Fluid overload 08/02/2015  . HTN (hypertension) 08/02/2015  . Chronic combined systolic and diastolic heart failure ([place]) 11/10/2014    Past Surgical History:  Procedure Laterality Date  . A/V SHUNT INTERVENTION N/A 04/06/2016   Procedure: A/V Shunt Intervention;  Surgeon: Katha Cabal, MD;  Location: [place] [facility];  Service: Cardiovascular;  Laterality: N/A;  . A/V SHUNT INTERVENTION N/A 06/29/2016   Procedure: A/V Shunt Intervention;  Surgeon: Katha Cabal, MD;  Location: [place] [facility];  Service: Cardiovascular;  Laterality: N/A;  . A/V SHUNTOGRAM Left 04/06/2016   Procedure: A/V Fistulagram;  Surgeon: Katha Cabal, MD;  Location: [place] [facility];  Service: Cardiovascular;  Laterality: Left;  . AV FISTULA PLACEMENT Left 2014  . DIALYSIS/PERMA CATHETER INSERTION Right    Dr. Delana Meyer  . PERIPHERAL VASCULAR CATHETERIZATION N/A 10/03/2014   Procedure: A/V Shuntogram/Fistulagram;  Surgeon: Algernon Huxley, MD;  Location: [place] [facility];  Service: Cardiovascular;  Laterality: N/A;  .  PERIPHERAL VASCULAR CATHETERIZATION Left 10/03/2014   Procedure: A/V Shunt Intervention;  Surgeon: Algernon Huxley, MD;  Location: [place] [facility];  Service: Cardiovascular;  Laterality: Left;  . PERIPHERAL VASCULAR CATHETERIZATION Left 05/01/2015   Procedure: A/V Shuntogram/Fistulagram;  Surgeon: Algernon Huxley, MD;  Location: [place] [facility];  Service: Cardiovascular;  Laterality: Left;  . PERIPHERAL VASCULAR CATHETERIZATION N/A 05/01/2015   Procedure:  A/V Shunt Intervention;  Surgeon: Algernon Huxley, MD;  Location: [place] [facility];  Service: Cardiovascular;  Laterality: N/A;  . UPPER EXTREMITY ANGIOGRAPHY Bilateral 06/29/2016   Procedure: Upper Extremity Angiography;  Surgeon: Katha Cabal, MD;  Location: [place] [facility];  Service: Cardiovascular;  Laterality: Bilateral;  . UPPER EXTREMITY INTERVENTION  06/29/2016   Procedure: Upper Extremity Intervention;  Surgeon: Katha Cabal, MD;  Location: [place] [facility];  Service: Cardiovascular;;    Prior to Admission medications   Medication Sig Start Date End Date Taking? Authorizing Provider  aspirin EC 81 MG tablet Take 1 tablet (81 mg total) by mouth daily. 05/20/16  Yes End, Harrell Gave, MD  calcium acetate (PHOSLO) 667 MG capsule Take 1,334-2,001 mg by mouth 3 (three) times daily with meals. Take 2001 mgs with meals 3 times daily and 1334 with snacks 12/11/15  Yes [provider]  Calcium Carbonate Antacid (TUMS ULTRA 1000 PO) Take 2 tablets by mouth 3 (three) times daily before meals.   Yes [provider]  cinacalcet (SENSIPAR) 90 MG tablet Take 90 mg by mouth daily.   Yes [provider]  hydrALAZINE (APRESOLINE) 50 MG tablet Take 100 tablets by mouth 3 (three) times daily.  07/01/16  Yes [provider]  losartan (COZAAR) 25 MG tablet Take 25 mg by mouth daily.  06/17/16  Yes [provider]  albuterol (PROVENTIL HFA;VENTOLIN HFA) 108 (90 Base) MCG/ACT inhaler Inhale into the lungs every 4 (four) hours as needed for wheezing or shortness of breath.    [provider]  carvedilol (COREG) 6.25 MG tablet Take 1 tablet (6.25 mg total) by mouth 2 (two) times daily with a meal. 05/01/16   Bettey Costa, MD  ipratropium (ATROVENT HFA) 17 MCG/ACT inhaler Inhale 2 puffs into the lungs every 4 (four) hours as needed for wheezing.    [provider]  isosorbide mononitrate (IMDUR) 30 MG 24 hr tablet Take 1 tablet (30 mg total)  by mouth daily. 05/20/16 08/18/16  End, Harrell Gave, MD  lidocaine-prilocaine (EMLA) cream Apply 1 application topically as needed (dialysis access).    [provider]    Allergies Sulfa antibiotics  Family History  Problem Relation Age of Onset  . Hypertension Son   . Diabetes Son   . Hypertension Mother   . Diabetes Mother   . Diabetes Sister     Social History Social History  Substance Use Topics  . Smoking status: Current Some Day Smoker    Packs/day: 0.25    Years: 40.00    Types: Cigarettes  . Smokeless tobacco: Never Used  . Alcohol use No    Review of Systems Constitutional: No fever/chills Eyes: No visual changes. ENT: No sore throat. Cardiovascular: See history of present illness Respiratory: See history of present illness Gastrointestinal: No abdominal pain.  No nausea, no vomiting.  No diarrhea.  No constipation. Genitourinary: Negative for dysuria. Musculoskeletal: Negative for back pain. Skin: Negative for rash. Neurological: Negative for headaches, focal weakness or numbness.    ____________________________________________   PHYSICAL EXAM:  VITAL SIGNS: ED  Triage Vitals  Enc Vitals Group     BP 08/22/16 1137 (!) 160/99     Pulse Rate 08/22/16 1137 83     Resp 08/22/16 1137 16     Temp --      Temp src --      SpO2 08/22/16 1137 99 %     Weight 08/22/16 1135 156 lb (70.8 kg)     Height 08/22/16 1135 5\' 8"  (1.727 m)     Head Circumference --      Peak Flow --      Pain Score 08/22/16 1224 9     Pain Loc --      Pain Edu? --      Excl. in [place]? --     Constitutional: Alert and oriented. Well appearing With mild increased for her breathing. Eyes: Conjunctivae are normal. Head: Atraumatic. Nose: No congestion/rhinnorhea. Mouth/Throat: Mucous membranes are moist. Neck: No stridor.   Cardiovascular: Normal rate, regular rhythm. Grossly normal heart sounds.  Good peripheral circulation. Respiratory: Mild tachypnea, moderate JVD. Mild  crackles in the bases bilaterally. Mild use of accessory muscles. No wheezing. No extremities Gastrointestinal: Soft and nontender. No distention. Musculoskeletal: No lower extremity tenderness with 2+ surgery edema bilateral Neurologic:  Normal speech and language. No gross focal neurologic deficits are appreciated.  Skin:  Skin is warm, dry and intact. No rash noted. Psychiatric: Mood and affect are normal. Speech and behavior are normal.  ____________________________________________   LABS (all labs ordered are listed, but only abnormal results are displayed)  Labs Reviewed  BRAIN NATRIURETIC PEPTIDE - Abnormal; Notable for the following:       Result Value   B Natriuretic Peptide >4,500.0 (*)    All other components within normal limits  CBC WITH DIFFERENTIAL/PLATELET - Abnormal; Notable for the following:    RBC 3.29 (*)    Hemoglobin 10.1 (*)    HCT 30.7 (*)    RDW 16.6 (*)    Lymphs Abs 0.8 (*)    All other components within normal limits  COMPREHENSIVE METABOLIC PANEL - Abnormal; Notable for the following:    Chloride 100 (*)    Glucose, Bld 121 (*)    BUN 62 (*)    Creatinine, Ser 8.27 (*)    GFR calc non Af Amer 6 (*)    GFR calc Af Amer 7 (*)    All other components within normal limits  TROPONIN I - Abnormal; Notable for the following:    Troponin I 0.18 (*)    All other components within normal limits  TROPONIN I   ____________________________________________  EKG  Reviewed and interpreted by me at 1205 Heart rate 80 Care is 120 QTc 490 Normal sinus rhythm, evidence of left ventricular atrophy with repolarization abnormality. Compared with previous no significant changes noted ____________________________________________  RADIOLOGY  Dg Chest Portable 1 View  Result Date: 08/22/2016 CLINICAL DATA:  End-stage renal disease. EXAM: PORTABLE CHEST 1 VIEW COMPARISON:  07/13/2016 FINDINGS: Right dialysis catheter remains in place, unchanged. Cardiomegaly,  vascular congestion. No confluent opacities, effusions or overt edema. No acute bony abnormality. IMPRESSION: Cardiomegaly, vascular congestion. Electronically Signed   By: Rolm Baptise M.D.   On: 08/22/2016 12:31    ____________________________________________   PROCEDURES  Procedure(s) performed: None  Procedures  Critical Care performed: No  ____________________________________________   INITIAL IMPRESSION / ASSESSMENT AND PLAN / ED COURSE  Pertinent labs & imaging results that were available during my care of the patient were reviewed by me  and considered in my medical decision making (see chart for details).  Case discussed with Dr. Holley Raring who recommends 120 mg of Lasix plus hydralazine be administered.  Patient presents for dyspnea. Associated with chest pain, but this is relieved after 2 nitroglycerin. Hydralazine given IV Lasix given with little effect on making about 100 mL of urine and blood pressure including diastolic seems to be in worsening. He does have mild ongoing work of breathing despite treatments provided here, at this point I'll give him a dose of IV hydralazine, nitroglycerin paste, and due to his increased work of breathing and minimal urine output will admit for further workup, due for dialysis tomorrow. No oxygen requirement at this time, but increased work of breathing is notable along with significant diastolic hypertension. Troponin elevated, this appears to be chronic.  Though not radiographically apparent, the patient does exhibit mild rales in the bases bilaterally indicative of likely pulmonary edema which has developed. I will admit him for further workup at this time. Patient is agreeable and reports ongoing shortness of breath but no ongoing chest pain.      ____________________________________________   FINAL CLINICAL IMPRESSION(S) / ED DIAGNOSES  Final diagnoses:  Hypervolemia, unspecified hypervolemia type  Hypertensive urgency  Acute  pulmonary edema (HCC)      NEW MEDICATIONS STARTED DURING THIS VISIT:  New Prescriptions   No medications on file     Note:  This document was prepared using Dragon voice recognition software and may include unintentional dictation errors.     Delman Kitten, MD 08/22/16 [phone]

## 2016-08-22 NOTE — ED Notes (Addendum)
Pt c/o chest pain. Dr. Jacqualine Code has been notified.RN Elmo Putt has been notified as well.

## 2016-08-23 LAB — BASIC METABOLIC PANEL
ANION GAP: 12 (ref 5–15)
BUN: 70 mg/dL — ABNORMAL HIGH (ref 6–20)
CO2: 25 mmol/L (ref 22–32)
Calcium: 9 mg/dL (ref 8.9–10.3)
Chloride: 98 mmol/L — ABNORMAL LOW (ref 101–111)
Creatinine, Ser: 9.09 mg/dL — ABNORMAL HIGH (ref 0.61–1.24)
GFR, EST AFRICAN AMERICAN: 6 mL/min — AB (ref >60)
GFR, EST NON AFRICAN AMERICAN: 5 mL/min — AB (ref >60)
Glucose, Bld: 124 mg/dL — ABNORMAL HIGH (ref 65–99)
Potassium: 4.7 mmol/L (ref 3.5–5.1)
Sodium: 135 mmol/L (ref 135–145)

## 2016-08-23 NOTE — Progress Notes (Signed)
Post hd vitals 

## 2016-08-23 NOTE — Progress Notes (Signed)
[facility]  ROUNDING NOTE   Subjective:   [name] admitted to [name] on 08/22/2016 for Acute pulmonary edema ([place]) [J81.0] Hypertensive urgency [I16.0] Acute on chronic combined systolic and diastolic CHF (congestive heart failure) ([place]) [I50.43] Hypervolemia, unspecified hypervolemia type [E87.70]  Patient states he had a full dialysis treatment on Friday but by that evening, started having shortness of breath which was progressive over the weekend. He presented to the ED where he was found to have pulmonary edema and admitted for emergent hemodialysis.     HEMODIALYSIS FLOWSHEET:  Blood Flow Rate (mL/min): 350 mL/min Arterial Pressure (mmHg): -130 mmHg Venous Pressure (mmHg): 130 mmHg Transmembrane Pressure (mmHg): 80 mmHg Ultrafiltration Rate (mL/min): 1000 mL/min Dialysate Flow Rate (mL/min): 600 ml/min Conductivity: Machine : 14.4 Conductivity: Machine : 14.4 Dialysis Fluid Bolus: Normal Saline Bolus Amount (mL): 250 mL    Objective:  Vital signs in last 24 hours:  Temp:  [97.5 F (36.4 C)-98 F (36.7 C)] 97.6 F (36.4 C) (07/02 1040) Pulse Rate:  [68-85] 72 (07/02 1130) Resp:  [17-39] 21 (07/02 1130) BP: (136-178)/(87-109) 148/95 (07/02 1130) SpO2:  [93 %-100 %] 100 % (07/02 1130) Weight:  [70.3 kg (155 lb)-81.8 kg (180 lb 5.4 oz)] 81.8 kg (180 lb 5.4 oz) (07/02 1040)  Weight change:  Filed Weights   08/22/16 1135 08/23/16 0544 08/23/16 1040  Weight: 70.8 kg (156 lb) 70.3 kg (155 lb) 81.8 kg (180 lb 5.4 oz)    Intake/Output: I/O last 3 completed shifts: In: 24 [P.O.:480; IV Piggyback:50] Out: -    Intake/Output this shift:  Total I/O In: 240 [P.O.:240] Out: -   Physical Exam: General: NAD, laying in bed  Head: Normocephalic, atraumatic. Moist oral mucosal membranes  Eyes: Anicteric, PERRL  Neck: Supple, trachea midline  Lungs:  Clear to auscultation  Heart: Regular rate and rhythm  Abdomen:  Soft, nontender,   Extremities: no  peripheral edema.  Neurologic: Nonfocal, moving all four extremities  Skin: No lesions  Access: RIJ permcath, Aneurysmal left forearm AVF    Basic Metabolic Panel:  Recent Labs Lab 08/22/16 1146 08/22/16 1515 08/23/16 0659  NA 137  --  135  K 4.5  --  4.7  CL 100*  --  98*  CO2 24  --  25  GLUCOSE 121*  --  124*  BUN 62*  --  70*  CREATININE 8.27*  --  9.09*  CALCIUM 9.5  --  9.0  MG  --  2.0  --     Liver Function Tests:  Recent Labs Lab 08/22/16 1146  AST 29  ALT 22  ALKPHOS 79  BILITOT 0.8  PROT 7.5  ALBUMIN 3.6   No results for input(s): LIPASE, AMYLASE in the last 168 hours. No results for input(s): AMMONIA in the last 168 hours.  CBC:  Recent Labs Lab 08/22/16 1146  WBC 5.3  NEUTROABS 3.6  HGB 10.1*  HCT 30.7*  MCV 93.1  PLT 163    Cardiac Enzymes:  Recent Labs Lab 08/22/16 1146 08/22/16 1515 08/22/16 2011  TROPONINI 0.18* 0.18* 0.17*    BNP: Invalid input(s): POCBNP  CBG: No results for input(s): GLUCAP in the last 168 hours.  Microbiology: Results for orders placed or performed during the [name] encounter of 08/22/16  MRSA PCR Screening     Status: None   Collection Time: 08/22/16  5:15 PM  Result Value Ref Range Status   MRSA by PCR NEGATIVE NEGATIVE Final    Comment:  The GeneXpert MRSA Assay (FDA approved for NASAL specimens only), is one component of a comprehensive MRSA colonization surveillance program. It is not intended to diagnose MRSA infection nor to guide or monitor treatment for MRSA infections.     Coagulation Studies: No results for input(s): LABPROT, INR in the last 72 hours.  Urinalysis: No results for input(s): COLORURINE, LABSPEC, PHURINE, GLUCOSEU, HGBUR, BILIRUBINUR, KETONESUR, PROTEINUR, UROBILINOGEN, NITRITE, LEUKOCYTESUR in the last 72 hours.  Invalid input(s): APPERANCEUR    Imaging: Dg Chest Portable 1 View  Result Date: 08/22/2016 CLINICAL DATA:  End-stage renal disease. EXAM:  PORTABLE CHEST 1 VIEW COMPARISON:  07/13/2016 FINDINGS: Right dialysis catheter remains in place, unchanged. Cardiomegaly, vascular congestion. No confluent opacities, effusions or overt edema. No acute bony abnormality. IMPRESSION: Cardiomegaly, vascular congestion. Electronically Signed   By: Rolm Baptise M.D.   On: 08/22/2016 12:31     Medications:   . sodium chloride     . aspirin EC  81 mg Oral Daily  . calcium acetate  1,334 mg Oral Q1500  . calcium acetate  2,001 mg Oral TID WC  . calcium carbonate  2 tablet Oral TID AC  . carvedilol  6.25 mg Oral BID WC  . cinacalcet  90 mg Oral Q breakfast  . furosemide  40 mg Intravenous Q12H  . heparin  5,000 Units Subcutaneous Q8H  . hydrALAZINE  50 mg Oral TID  . isosorbide mononitrate  30 mg Oral Daily  . losartan  25 mg Oral Daily  . nitroGLYCERIN  1 inch Topical Q6H  . sodium chloride flush  3 mL Intravenous Q12H   sodium chloride, acetaminophen **OR** acetaminophen, albuterol, bisacodyl, guaiFENesin, hydrALAZINE, HYDROcodone-acetaminophen, ondansetron **OR** ondansetron (ZOFRAN) IV, senna-docusate, sodium chloride flush  Assessment/ Plan:  [name] is a 61 y.o. black male with End stage renal disease on hemodialysis, hypertension, hepatitis C, anemia, seizure disorder, COPD/tobacco abuse, secondary hyperparathyroidism  [facility] MWF [address]   1. End Stage Renal Disease: with pulmonary edema. Emergent hemodialysis this morning. Seen and examined on dialysis.  Potassium at goal.  Using RIJ permcath.  Complication of dialysis access device. Aneurysmal AVF.  - resume MWF schedule.   2. Hypertension: history of difficult control. Well controlled on dialysis treatment.  - furosemide, carvedilol, hydralazine, imdur, losartan  3. Anemia of chronic kidney disease: hemoglobin 10.1 - epo with HD treatment.   4. Secondary Hyperparathyroidism: with hyperphosphatemia. Outpatient labs 6/25 phos 7.7, PTH 1016, calcium  8.3 - cinacalcet - calcium acetate with meals.    LOS: [name], [name] 7/2/201811:59 AM

## 2016-08-23 NOTE — Progress Notes (Signed)
  End of hd 

## 2016-08-23 NOTE — Care Management Note (Addendum)
Case Management Note  Patient Details  Name: Pradeep Beaubrun MRN: [id number] Date of Birth: [birth year]  Subjective/Objective:  Admitted with CHF exacerbation. Patient active with [facility] M[facility]  For ESRD. Estill Bamberg with [facility] notified of admission. PCP is Princella Ion.                 Action/Plan: Following progression.   Expected Discharge Date:                  Expected Discharge Plan:     In-House Referral:     Discharge planning Services  CM Consult  Post Acute Care Choice:    Choice offered to:     DME Arranged:    DME Agency:     HH Arranged:    HH Agency:     Status of Service:  In process, will continue to follow  If discussed at Long Length of Stay Meetings, dates discussed:    Additional Comments:  Jolly Mango, RN 08/23/2016, 8:29 AM

## 2016-08-23 NOTE — Progress Notes (Signed)
Post hd assessment 

## 2016-08-23 NOTE — Progress Notes (Signed)
[place] at [facility]                                                                                                                                                                                  Patient Demographics   [name], is a 61 y.o. male, DOB - Aug 08, 1955, ZTI:[id number]  Admit date - 08/22/2016   Admitting Physician Demetrios Loll, MD  Outpatient Primary MD for the patient is [facility] [place]   LOS - 1  Subjective: Complains of shortness of breath, denies any chest pain or palpitations    Review of Systems:   CONSTITUTIONAL: No documented fever. No fatigue, weakness. No weight gain, no weight loss.  EYES: No blurry or double vision.  ENT: No tinnitus. No postnasal drip. No redness of the oropharynx.  RESPIRATORY: No cough, no wheeze, no hemoptysis.Positive dyspnea.  CARDIOVASCULAR: No chest pain. No orthopnea. No palpitations. No syncope.  GASTROINTESTINAL: No nausea, no vomiting or diarrhea. No abdominal pain. No melena or hematochezia.  GENITOURINARY: No dysuria or hematuria.  ENDOCRINE: No polyuria or nocturia. No heat or cold intolerance.  HEMATOLOGY: No anemia. No bruising. No bleeding.  INTEGUMENTARY: No rashes. No lesions.  MUSCULOSKELETAL: No arthritis. No swelling. No gout.  NEUROLOGIC: No numbness, tingling, or ataxia. No seizure-type activity.  PSYCHIATRIC: No anxiety. No insomnia. No ADD.    Vitals:   Vitals:   08/23/16 1300 08/23/16 1330 08/23/16 1410 08/23/16 1420  BP: (!) 157/93 (!) 155/88 (!) 153/89 (!) 148/88  Pulse: 70 68 70 72  Resp: 10 18 19 19   Temp:   97.8 F (36.6 C)   TempSrc:   Oral   SpO2: 100% 100% 100% 100%  Weight:      Height:        Wt Readings from Last 3 Encounters:  08/23/16 180 lb 5.4 oz (81.8 kg)  07/30/16 173 lb (78.5 kg)  07/27/16 173 lb (78.5 kg)     Intake/Output Summary (Last 24 hours) at 08/23/16 1519 Last data filed at 08/23/16 1410  Gross per 24 hour   Intake              770 ml  Output             3000 ml  Net            -2230 ml    Physical Exam:   GENERAL: Pleasant-appearing in no apparent distress.  HEAD, EYES, EARS, NOSE AND THROAT: Atraumatic, normocephalic. Extraocular muscles are intact. Pupils equal and reactive to light. Sclerae anicteric. No conjunctival injection. No oro-pharyngeal erythema.  NECK: Supple. There is no jugular venous distention. No bruits, no  lymphadenopathy, no thyromegaly.  HEART: Regular rate and rhythm,. No murmurs, no rubs, no clicks.  LUNGS: Bilateral crackles at the bases. No rales or rhonchi. No wheezes.  ABDOMEN: Soft, flat, nontender, nondistended. Has good bowel sounds. No hepatosplenomegaly appreciated.  EXTREMITIES: No evidence of any cyanosis, clubbing, or peripheral edema.  +2 pedal and radial pulses bilaterally.  NEUROLOGIC: The patient is alert, awake, and oriented x3 with no focal motor or sensory deficits appreciated bilaterally.  SKIN: Moist and warm with no rashes appreciated.  Psych: Not anxious, depressed LN: No inguinal LN enlargement    Antibiotics   Anti-infectives    None      Medications   Scheduled Meds: . aspirin EC  81 mg Oral Daily  . calcium acetate  1,334 mg Oral Q1500  . calcium acetate  2,001 mg Oral TID WC  . calcium carbonate  2 tablet Oral TID AC  . carvedilol  6.25 mg Oral BID WC  . cinacalcet  90 mg Oral Q breakfast  . furosemide  40 mg Intravenous Q12H  . heparin  5,000 Units Subcutaneous Q8H  . hydrALAZINE  50 mg Oral TID  . isosorbide mononitrate  30 mg Oral Daily  . losartan  25 mg Oral Daily  . nitroGLYCERIN  1 inch Topical Q6H  . sodium chloride flush  3 mL Intravenous Q12H   Continuous Infusions: . sodium chloride     PRN Meds:.sodium chloride, acetaminophen **OR** acetaminophen, albuterol, bisacodyl, guaiFENesin, hydrALAZINE, HYDROcodone-acetaminophen, ondansetron **OR** ondansetron (ZOFRAN) IV, senna-docusate, sodium chloride  flush   Data Review:   Micro Results Recent Results (from the past 240 hour(s))  MRSA PCR Screening     Status: None   Collection Time: 08/22/16  5:15 PM  Result Value Ref Range Status   MRSA by PCR NEGATIVE NEGATIVE Final    Comment:        The GeneXpert MRSA Assay (FDA approved for NASAL specimens only), is one component of a comprehensive MRSA colonization surveillance program. It is not intended to diagnose MRSA infection nor to guide or monitor treatment for MRSA infections.     Radiology Reports Dg Chest Portable 1 View  Result Date: 08/22/2016 CLINICAL DATA:  End-stage renal disease. EXAM: PORTABLE CHEST 1 VIEW COMPARISON:  07/13/2016 FINDINGS: Right dialysis catheter remains in place, unchanged. Cardiomegaly, vascular congestion. No confluent opacities, effusions or overt edema. No acute bony abnormality. IMPRESSION: Cardiomegaly, vascular congestion. Electronically Signed   By: Rolm Baptise M.D.   On: 08/22/2016 12:31     CBC  Recent Labs Lab 08/22/16 1146  WBC 5.3  HGB 10.1*  HCT 30.7*  PLT 163  MCV 93.1  MCH 30.7  MCHC 32.9  RDW 16.6*  LYMPHSABS 0.8*  MONOABS 0.8  EOSABS 0.1  BASOSABS 0.1    Chemistries   Recent Labs Lab 08/22/16 1146 08/22/16 1515 08/23/16 0659  NA 137  --  135  K 4.5  --  4.7  CL 100*  --  98*  CO2 24  --  25  GLUCOSE 121*  --  124*  BUN 62*  --  70*  CREATININE 8.27*  --  9.09*  CALCIUM 9.5  --  9.0  MG  --  2.0  --   AST 29  --   --   ALT 22  --   --   ALKPHOS 79  --   --   BILITOT 0.8  --   --    ------------------------------------------------------------------------------------------------------------------ estimated creatinine clearance is 8.2 mL/min (  A) (by C-G formula based on SCr of 9.09 mg/dL (H)). ------------------------------------------------------------------------------------------------------------------ No results for input(s): HGBA1C in the last 72  hours. ------------------------------------------------------------------------------------------------------------------ No results for input(s): CHOL, HDL, LDLCALC, TRIG, CHOLHDL, LDLDIRECT in the last 72 hours. ------------------------------------------------------------------------------------------------------------------ No results for input(s): TSH, T4TOTAL, T3FREE, THYROIDAB in the last 72 hours.  Invalid input(s): FREET3 ------------------------------------------------------------------------------------------------------------------ No results for input(s): VITAMINB12, FOLATE, FERRITIN, TIBC, IRON, RETICCTPCT in the last 72 hours.  Coagulation profile No results for input(s): INR, PROTIME in the last 168 hours.  No results for input(s): DDIMER in the last 72 hours.  Cardiac Enzymes  Recent Labs Lab 08/22/16 1146 08/22/16 1515 08/22/16 2011  TROPONINI 0.18* 0.18* 0.17*   ------------------------------------------------------------------------------------------------------------------ Invalid input(s): POCBNP    Assessment & Plan  Patient is a 61 year old on hemodialysis with shortness of breath  1.Acute on chronic systolic and diastolic CHF. Ejection fraction 20-25%. Plan for hemodialysis later today with fluid removal  2. Elevated troponin. Due to demanding ischemia. Continue aspirin  3. Accelerated hypertension. Continue home hypertension medication, start hydralazine IV when necessary.  4. ESRD. Hemodialysis later today  5. Miscellaneous heparin for DVT prophylaxis    Code Status Orders        Start     Ordered   08/22/16 1658  Full code  Continuous     08/22/16 1657    Code Status History    Date Active Date Inactive Code Status Order ID Comments User Context   07/04/2016  7:29 PM 07/05/2016  5:28 PM Full Code [id number]  Idelle Crouch, MD Inpatient   06/07/2016  1:02 AM 06/08/2016  3:14 PM Full Code [id number]  Hugelmeyer, [place], DO Inpatient    04/30/2016 12:07 AM 05/01/2016  6:21 PM Full Code [id number]  Lance Coon, MD Inpatient   02/26/2016  9:07 PM 02/28/2016  6:04 PM Full Code [id number]  Lance Coon, MD Inpatient   01/07/2016  1:32 AM 01/07/2016  5:24 PM Full Code [id number]  Hugelmeyer, [place], DO ED   08/02/2015 11:16 PM 08/04/2015 11:11 PM Full Code [id number]  Quintella Baton, MD Inpatient   12/02/2014  8:52 PM 12/04/2014  5:53 PM Full Code [id number]  Vaughan Basta, MD Inpatient   11/10/2014 11:23 PM 11/12/2014  3:06 PM Full Code [id number]  Gladstone Lighter, MD Inpatient           Consults Nephrology   DVT Prophylaxis  heparin  Lab Results  Component Value Date   PLT 163 08/22/2016     Time Spent in minutes   54min  Greater than 50% of time spent in care coordination and counseling patient regarding the condition and plan of care.   Dustin Flock M.D on 08/23/2016 at 3:19 PM  Between 7am to 6pm - Pager - [phone]  After 6pm go to [email or web address] - password [email or web address] [facility]   Office  [phone]

## 2016-08-23 NOTE — Progress Notes (Signed)
PRE DIALYSIS ASSESSMENT 

## 2016-08-23 NOTE — Progress Notes (Signed)
HD STARTED  

## 2016-08-24 LAB — BASIC METABOLIC PANEL
Anion gap: 7 (ref 5–15)
BUN: 39 mg/dL — ABNORMAL HIGH (ref 6–20)
CHLORIDE: 99 mmol/L — AB (ref 101–111)
CO2: 33 mmol/L — ABNORMAL HIGH (ref 22–32)
Calcium: 9.5 mg/dL (ref 8.9–10.3)
Creatinine, Ser: 6.38 mg/dL — ABNORMAL HIGH (ref 0.61–1.24)
GFR, EST AFRICAN AMERICAN: 10 mL/min — AB (ref >60)
GFR, EST NON AFRICAN AMERICAN: 8 mL/min — AB (ref >60)
Glucose, Bld: 93 mg/dL (ref 65–99)
Potassium: 4.5 mmol/L (ref 3.5–5.1)
SODIUM: 139 mmol/L (ref 135–145)

## 2016-08-24 MED ORDER — ISOSORBIDE MONONITRATE ER 30 MG PO TB24: 30.0000 mg | ORAL_TABLET | Freq: Every day | ORAL | 2 refills | Status: DC

## 2016-08-24 NOTE — Care Management Important Message (Signed)
Important Message  Patient Details  Name: [name] MRN: [id number] Date of Birth: 08/14/1955   Medicare Important Message Given:  N/A - LOS <3 / Initial given by admissions    Jolly Mango, RN 08/24/2016, 12:03 PM

## 2016-08-24 NOTE — Discharge Summary (Signed)
[facility] - [place] at [facility], 61 y.o., DOB Feb 15, 1956, MRN [id number]. Admission date: 08/22/2016 Discharge Date 08/24/2016 Primary MD [facility] [place] Admitting Physician Demetrios Loll, MD  Admission Diagnosis  Acute pulmonary edema ([place]) [J81.0] Hypertensive urgency [I16.0] Acute on chronic combined systolic and diastolic CHF (congestive heart failure) ([place]) [I50.43] Hypervolemia, unspecified hypervolemia type [E87.70]  Discharge Diagnosis   Active Problems:   Acute on chronic combined systolic and diastolic CHF (congestive heart failure) ([place])   Accelerated hypertension   End-stage renal disease   Pulmonary hypertension   Peripheral vascular disease        Hospital Course [name]  is a 61 y.o. male with a known history of Chronic systolic and diastolic CHF, ESRD on hemodialysis, hepatitis, hypertension, PVD, hypertension and arthritis. The patient presented the ED with the above chief complaints. She got hemodialysis last Friday. But has had worsening cough, sputum and shortness breath for the past 3 days. Patient's chest x-ray suggestive of CHF. He was admitted for CHF exasperation. He was given IV Lasix but no significant output however he had his dialysis and his breathing improved significantly currently his room air he's feeling much better and is back to baseline.            Consults  nephrology  Significant Tests:  See full reports for all details    Dg Chest Portable 1 View  Result Date: 08/22/2016 CLINICAL DATA:  End-stage renal disease. EXAM: PORTABLE CHEST 1 VIEW COMPARISON:  07/13/2016 FINDINGS: Right dialysis catheter remains in place, unchanged. Cardiomegaly, vascular congestion. No confluent opacities, effusions or overt edema. No acute bony abnormality. IMPRESSION: Cardiomegaly, vascular congestion. Electronically Signed   By: Rolm Baptise M.D.   On: 08/22/2016 12:31       Today   Subjective:    [name]  patient feels much better breathing much improved  Objective:   Blood pressure (!) 141/89, pulse 68, temperature 97.8 F (36.6 C), temperature source Oral, resp. rate 18, height 5\' 8"  (1.727 m), weight 166 lb 12.8 oz (75.7 kg), SpO2 95 %.  .  Intake/Output Summary (Last 24 hours) at 08/24/16 1309 Last data filed at 08/24/16 0933  Gross per 24 hour  Intake              480 ml  Output             3300 ml  Net            -2820 ml    Exam VITAL SIGNS: Blood pressure (!) 141/89, pulse 68, temperature 97.8 F (36.6 C), temperature source Oral, resp. rate 18, height 5\' 8"  (1.727 m), weight 166 lb 12.8 oz (75.7 kg), SpO2 95 %.  GENERAL:  61 y.o.-year-old patient lying in the bed with no acute distress.  EYES: Pupils equal, round, reactive to light and accommodation. No scleral icterus. Extraocular muscles intact.  HEENT: Head atraumatic, normocephalic. Oropharynx and nasopharynx clear.  NECK:  Supple, no jugular venous distention. No thyroid enlargement, no tenderness.  LUNGS: Normal breath sounds bilaterally, no wheezing, rales,rhonchi or crepitation. No use of accessory muscles of respiration.  CARDIOVASCULAR: S1, S2 normal. No murmurs, rubs, or gallops.  ABDOMEN: Soft, nontender, nondistended. Bowel sounds present. No organomegaly or mass.  EXTREMITIES: No pedal edema, cyanosis, or clubbing.  NEUROLOGIC: Cranial nerves II through XII are intact. Muscle strength 5/5 in all extremities. Sensation intact. Gait not checked.  PSYCHIATRIC: The patient is alert and oriented x 3.  SKIN: No obvious rash, lesion, or ulcer.   Data Review     CBC w Diff: Lab Results  Component Value Date   WBC 5.3 08/22/2016   HGB 10.1 (L) 08/22/2016   HGB 9.8 (L) 09/04/2013   HCT 30.7 (L) 08/22/2016   HCT 31.2 (L) 09/04/2013   PLT 163 08/22/2016   PLT 203 09/04/2013   LYMPHOPCT 14 08/22/2016   LYMPHOPCT 16.0 04/28/2013   MONOPCT 14 08/22/2016   MONOPCT 9.0 04/28/2013   EOSPCT 2  08/22/2016   EOSPCT 2.8 04/28/2013   BASOPCT 2 08/22/2016   BASOPCT 0.5 04/28/2013   CMP: Lab Results  Component Value Date   NA 139 08/24/2016   NA 139 04/30/2013   K 4.5 08/24/2016   K 4.4 04/30/2013   CL 99 (L) 08/24/2016   CL 107 04/30/2013   CO2 33 (H) 08/24/2016   CO2 25 04/30/2013   BUN 39 (H) 08/24/2016   BUN 76 (H) 04/30/2013   CREATININE 6.38 (H) 08/24/2016   CREATININE 9.49 (H) 04/30/2013   PROT 7.5 08/22/2016   PROT 7.2 04/26/2013   ALBUMIN 3.6 08/22/2016   ALBUMIN 2.6 (L) 04/30/2013   BILITOT 0.8 08/22/2016   BILITOT 0.4 04/26/2013   ALKPHOS 79 08/22/2016   ALKPHOS 68 04/26/2013   AST 29 08/22/2016   AST 56 (H) 04/26/2013   ALT 22 08/22/2016   ALT 50 04/26/2013  .  Micro Results Recent Results (from the past 240 hour(s))  MRSA PCR Screening     Status: None   Collection Time: 08/22/16  5:15 PM  Result Value Ref Range Status   MRSA by PCR NEGATIVE NEGATIVE Final    Comment:        The GeneXpert MRSA Assay (FDA approved for NASAL specimens only), is one component of a comprehensive MRSA colonization surveillance program. It is not intended to diagnose MRSA infection nor to guide or monitor treatment for MRSA infections.         Code Status Orders        Start     Ordered   08/22/16 1658  Full code  Continuous     08/22/16 1657    Code Status History    Date Active Date Inactive Code Status Order ID Comments User Context   07/04/2016  7:29 PM 07/05/2016  5:28 PM Full Code [id number]  Idelle Crouch, MD Inpatient   06/07/2016  1:02 AM 06/08/2016  3:14 PM Full Code [id number]  Hugelmeyer, [place], DO Inpatient   04/30/2016 12:07 AM 05/01/2016  6:21 PM Full Code [id number]  Lance Coon, MD Inpatient   02/26/2016  9:07 PM 02/28/2016  6:04 PM Full Code [id number]  Lance Coon, MD Inpatient   01/07/2016  1:32 AM 01/07/2016  5:24 PM Full Code [id number]  [place], [place], DO ED   08/02/2015 11:16 PM 08/04/2015 11:11 PM Full Code [id number]  Quintella Baton, MD Inpatient   12/02/2014  8:52 PM 12/04/2014  5:53 PM Full Code [id number]  Vaughan Basta, MD Inpatient   11/10/2014 11:23 PM 11/12/2014  3:06 PM Full Code [id number]  Gladstone Lighter, MD Inpatient          Follow-up Information    [place]. Go on 09/07/2016.   Specialty:  Cardiology Why:  at 12:20pm  Contact information: [place] [address] [place], [place] Follow up in 1 week(s).   Specialty:  General Practice Contact information: [place]. [place] [address] [phone]           Discharge Medications   Allergies as of 08/24/2016      Reactions   Sulfa Antibiotics Rash      Medication List    TAKE these medications   albuterol 108 (90 Base) MCG/ACT inhaler Commonly known as:  PROVENTIL HFA;VENTOLIN HFA Inhale into the lungs every 4 (four) hours as needed for wheezing or shortness of breath.   aspirin EC 81 MG tablet Take 1 tablet (81 mg total) by mouth daily.   calcium acetate 667 MG capsule Commonly known as:  PHOSLO Take 1,334-2,001 mg by mouth 3 (three) times daily with meals. Take 2001 mgs with meals 3 times daily and 1334 with snacks   carvedilol 6.25 MG tablet Commonly known as:  COREG Take 1 tablet (6.25 mg total) by mouth 2 (two) times daily with a meal.   cinacalcet 90 MG tablet Commonly known as:  SENSIPAR Take 90 mg by mouth daily.   hydrALAZINE 50 MG tablet Commonly known as:  APRESOLINE Take 100 tablets by mouth 3 (three) times daily.   ipratropium 17 MCG/ACT inhaler Commonly known as:  ATROVENT HFA Inhale 2 puffs into the lungs every 4 (four) hours as needed for wheezing.   isosorbide mononitrate 30 MG 24 hr tablet Commonly known as:  IMDUR Take 1 tablet (30 mg total) by mouth daily.   lidocaine-prilocaine cream Commonly known as:  EMLA Apply 1 application  topically as needed (dialysis access).   losartan 25 MG tablet Commonly known as:  COZAAR Take 25 mg by mouth daily.   TUMS ULTRA 1000 PO Take 2 tablets by mouth 3 (three) times daily before meals.          Total Time in preparing paper work, data evaluation and todays exam - 35 minutes  Dustin Flock M.D on 08/24/2016 at 1:09 [facility]  [facility]   Office  [phone]

## 2016-08-24 NOTE — Progress Notes (Signed)
Patient in being discharge home at this time, discharge instruction provided, iv removed tele removed.

## 2016-08-24 NOTE — Discharge Instructions (Signed)
Heart Failure Clinic appointment on September 07, 2016 at 12:20pm with Darylene Price, [place]. Please call [phone] to reschedule.   [place] at [place]:  Renal diet  DISCHARGE CONDITION:  Stable  ACTIVITY:  Activity as tolerated  OXYGEN:  Home Oxygen: No.   Oxygen Delivery: room air  DISCHARGE LOCATION:  home    ADDITIONAL DISCHARGE INSTRUCTION:   If you experience worsening of your admission symptoms, develop shortness of breath, life threatening emergency, suicidal or homicidal thoughts you must seek medical attention immediately by calling 911 or calling your MD immediately  if symptoms less severe.  You Must read complete instructions/literature along with all the possible adverse reactions/side effects for all the Medicines you take and that have been prescribed to you. Take any new Medicines after you have completely understood and accpet all the possible adverse reactions/side effects.   Please note  You were cared for by a hospitalist during your hospital stay. If you have any questions about your discharge medications or the care you received while you were in the hospital after you are discharged, you can call the unit and asked to speak with the hospitalist on call if the hospitalist that took care of you is not available. Once you are discharged, your primary care physician will handle any further medical issues. Please note that NO REFILLS for any discharge medications will be authorized once you are discharged, as it is imperative that you return to your primary care physician (or establish a relationship with a primary care physician if you do not have one) for your aftercare needs so that they can reassess your need for medications and monitor your lab values.

## 2016-08-24 NOTE — Progress Notes (Signed)
[facility]  ROUNDING NOTE   Subjective:   Hemodialysis treatment yesterday. Tolerated treatment well. UF of 3 litres.   Objective:  Vital signs in last 24 hours:  Temp:  [97.8 F (36.6 C)-98.4 F (36.9 C)] 98.4 F (36.9 C) (07/03 0840) Pulse Rate:  [68-73] 73 (07/03 0840) Resp:  [10-21] 18 (07/03 0840) BP: (122-165)/(60-104) 137/73 (07/03 0840) SpO2:  [90 %-100 %] 90 % (07/03 0840) Weight:  [75.7 kg (166 lb 12.8 oz)] 75.7 kg (166 lb 12.8 oz) (07/03 0448)  Weight change: 11 kg (24 lb 5.4 oz) Filed Weights   08/23/16 0544 08/23/16 1040 08/24/16 0448  Weight: 70.3 kg (155 lb) 81.8 kg (180 lb 5.4 oz) 75.7 kg (166 lb 12.8 oz)    Intake/Output: I/O last 3 completed shifts: In: 480 [P.O.:480] Out: 3200 [Urine:200; Other:3000]   Intake/Output this shift:  Total I/O In: 480 [P.O.:480] Out: 100 [Urine:100]  Physical Exam: General: NAD, sitting in chair  Head: Normocephalic, atraumatic. Moist oral mucosal membranes  Eyes: Anicteric, PERRL  Neck: Supple, trachea midline  Lungs:  Clear to auscultation  Heart: Regular rate and rhythm  Abdomen:  Soft, nontender,   Extremities: no peripheral edema.  Neurologic: Nonfocal, moving all four extremities  Skin: No lesions  Access: RIJ permcath, Aneurysmal left forearm AVF    Basic Metabolic Panel:  Recent Labs Lab 08/22/16 1146 08/22/16 1515 08/23/16 0659 08/24/16 0430  NA 137  --  135 139  K 4.5  --  4.7 4.5  CL 100*  --  98* 99*  CO2 24  --  25 33*  GLUCOSE 121*  --  124* 93  BUN 62*  --  70* 39*  CREATININE 8.27*  --  9.09* 6.38*  CALCIUM 9.5  --  9.0 9.5  MG  --  2.0  --   --     Liver Function Tests:  Recent Labs Lab 08/22/16 1146  AST 29  ALT 22  ALKPHOS 79  BILITOT 0.8  PROT 7.5  ALBUMIN 3.6   No results for input(s): LIPASE, AMYLASE in the last 168 hours. No results for input(s): AMMONIA in the last 168 hours.  CBC:  Recent Labs Lab 08/22/16 1146  WBC 5.3  NEUTROABS 3.6  HGB  10.1*  HCT 30.7*  MCV 93.1  PLT 163    Cardiac Enzymes:  Recent Labs Lab 08/22/16 1146 08/22/16 1515 08/22/16 2011  TROPONINI 0.18* 0.18* 0.17*    BNP: Invalid input(s): POCBNP  CBG: No results for input(s): GLUCAP in the last 168 hours.  Microbiology: Results for orders placed or performed during the hospital encounter of 08/22/16  MRSA PCR Screening     Status: None   Collection Time: 08/22/16  5:15 PM  Result Value Ref Range Status   MRSA by PCR NEGATIVE NEGATIVE Final    Comment:        The GeneXpert MRSA Assay (FDA approved for NASAL specimens only), is one component of a comprehensive MRSA colonization surveillance program. It is not intended to diagnose MRSA infection nor to guide or monitor treatment for MRSA infections.     Coagulation Studies: No results for input(s): LABPROT, INR in the last 72 hours.  Urinalysis: No results for input(s): COLORURINE, LABSPEC, PHURINE, GLUCOSEU, HGBUR, BILIRUBINUR, KETONESUR, PROTEINUR, UROBILINOGEN, NITRITE, LEUKOCYTESUR in the last 72 hours.  Invalid input(s): APPERANCEUR    Imaging: Dg Chest Portable 1 View  Result Date: 08/22/2016 CLINICAL DATA:  End-stage renal disease. EXAM: PORTABLE CHEST 1 VIEW COMPARISON:  07/13/2016 FINDINGS: Right dialysis catheter remains in place, unchanged. Cardiomegaly, vascular congestion. No confluent opacities, effusions or overt edema. No acute bony abnormality. IMPRESSION: Cardiomegaly, vascular congestion. Electronically Signed   By: Rolm Baptise M.D.   On: 08/22/2016 12:31     Medications:   . sodium chloride     . aspirin EC  81 mg Oral Daily  . calcium acetate  1,334 mg Oral Q1500  . calcium acetate  2,001 mg Oral TID WC  . calcium carbonate  2 tablet Oral TID AC  . carvedilol  6.25 mg Oral BID WC  . cinacalcet  90 mg Oral Q breakfast  . furosemide  40 mg Intravenous Q12H  . heparin  5,000 Units Subcutaneous Q8H  . hydrALAZINE  50 mg Oral TID  . isosorbide  mononitrate  30 mg Oral Daily  . losartan  25 mg Oral Daily  . nitroGLYCERIN  1 inch Topical Q6H  . sodium chloride flush  3 mL Intravenous Q12H   sodium chloride, acetaminophen **OR** acetaminophen, albuterol, bisacodyl, guaiFENesin, hydrALAZINE, HYDROcodone-acetaminophen, ondansetron **OR** ondansetron (ZOFRAN) IV, senna-docusate, sodium chloride flush  Assessment/ Plan:  [name] is a 61 y.o. black male with End stage renal disease on hemodialysis, hypertension, hepatitis C, anemia, seizure disorder, COPD/tobacco abuse, secondary hyperparathyroidism  [facility] MWF [address] [address]   1. End Stage Renal Disease: with pulmonary edema. Emergent hemodialysis on admission.  Using RIJ permcath.  Complication of dialysis access device. Aneurysmal AVF.  - Continue MWF schedule.   2. Hypertension: history of difficult control. Well controlled on dialysis treatment.  - furosemide, carvedilol, hydralazine, imdur, losartan  3. Anemia of chronic kidney disease: hemoglobin 10.1 - epo with HD treatment.   4. Secondary Hyperparathyroidism: with hyperphosphatemia. Outpatient labs 6/25 phos 7.7, PTH 1016, calcium 8.3 - cinacalcet - calcium acetate with meals.    LOS: [place], [place] 7/3/201811:02 AM

## 2016-09-07 ENCOUNTER — Encounter: Payer: Self-pay | Admitting: Family

## 2016-09-07 ENCOUNTER — Ambulatory Visit: Payer: Medicare Other | Attending: Family | Admitting: Family

## 2016-09-07 VITALS — BP 148/62 | HR 84 | Resp 20 | Ht 69.0 in | Wt 170.2 lb

## 2016-09-07 DIAGNOSIS — I5042 Chronic combined systolic (congestive) and diastolic (congestive) heart failure: Secondary | ICD-10-CM | POA: Insufficient documentation

## 2016-09-07 DIAGNOSIS — Z882 Allergy status to sulfonamides status: Secondary | ICD-10-CM | POA: Insufficient documentation

## 2016-09-07 DIAGNOSIS — Z992 Dependence on renal dialysis: Secondary | ICD-10-CM | POA: Insufficient documentation

## 2016-09-07 DIAGNOSIS — Z833 Family history of diabetes mellitus: Secondary | ICD-10-CM | POA: Diagnosis not present

## 2016-09-07 DIAGNOSIS — Z720 Tobacco use: Secondary | ICD-10-CM

## 2016-09-07 DIAGNOSIS — I5022 Chronic systolic (congestive) heart failure: Secondary | ICD-10-CM

## 2016-09-07 DIAGNOSIS — I132 Hypertensive heart and chronic kidney disease with heart failure and with stage 5 chronic kidney disease, or end stage renal disease: Secondary | ICD-10-CM | POA: Insufficient documentation

## 2016-09-07 DIAGNOSIS — Z9889 Other specified postprocedural states: Secondary | ICD-10-CM | POA: Diagnosis not present

## 2016-09-07 DIAGNOSIS — N186 End stage renal disease: Secondary | ICD-10-CM | POA: Diagnosis not present

## 2016-09-07 DIAGNOSIS — E875 Hyperkalemia: Secondary | ICD-10-CM | POA: Diagnosis not present

## 2016-09-07 DIAGNOSIS — I10 Essential (primary) hypertension: Secondary | ICD-10-CM

## 2016-09-07 DIAGNOSIS — F17210 Nicotine dependence, cigarettes, uncomplicated: Secondary | ICD-10-CM | POA: Insufficient documentation

## 2016-09-07 DIAGNOSIS — I739 Peripheral vascular disease, unspecified: Secondary | ICD-10-CM | POA: Diagnosis not present

## 2016-09-07 DIAGNOSIS — M1990 Unspecified osteoarthritis, unspecified site: Secondary | ICD-10-CM | POA: Insufficient documentation

## 2016-09-07 DIAGNOSIS — I272 Pulmonary hypertension, unspecified: Secondary | ICD-10-CM | POA: Diagnosis not present

## 2016-09-07 DIAGNOSIS — N185 Chronic kidney disease, stage 5: Secondary | ICD-10-CM

## 2016-09-07 DIAGNOSIS — Z8249 Family history of ischemic heart disease and other diseases of the circulatory system: Secondary | ICD-10-CM | POA: Insufficient documentation

## 2016-09-07 MED ORDER — TORSEMIDE 20 MG PO TABS: 20.0000 mg | ORAL_TABLET | Freq: Every day | ORAL | 3 refills | Status: DC

## 2016-09-07 NOTE — Progress Notes (Signed)
Patient ID: [name], male    DOB: [birth year], 61 y.o.   MRN: [id number]  HPI  [name] is a 61 y/o male with a history of HTN, chronic kidney diease (dialysis dependent), PVD, pulmonary HTN, hepatitis, arthritis, current tobacco use and chronic heart failure.   Reviewed last echo report from 04/30/16 which showed an EF of  20-25% along with moderate TR and severe Thomas and moderate AR.   Admitted 08/22/16 with HF exacerbation. Given IV lasix without much output but after dialysis, symptoms improved. Discharged home after 2 days. Was in the ED 07/13/16 due to dyspnea and chest pain. CXR was negative. Chronic troponin elevation. Discharged home. Admitted 07/04/16 due to severe hyperkalemia. Received emergent dialysis with improvement in potassium level. Discharged home the next day. Admitted 06/06/16 due to HF exacerbation. Received IV lasix along with dialysis. Initially required bipap but was weaned off of that. Discharged after 2 days.   Has not been seen since August 2017 but presents today for his follow-up visit with a chief complaint of moderate shortness of breath with little exertion. [name] describes this as chronic in nature but has been worsening over the last few months. [name] has associated fatigue, light-headedness and pedal edema along with this. Continues with dialysis 3 days/week.   Past Medical History:  Diagnosis Date  . Arthritis   . Chest pain    a. 09/2013 Myoview [facility]): basal inf defect more pronounced @ rest, likely artifact, EF 48%, low risk study.  . Chronic combined systolic and diastolic CHF (congestive heart failure) ([place])    a. 10/2013 Echo [facility]): EF 50%, diast dysfxn, triv AI/TR, mild Thomas;  b. 10/2014 Echo: EF 30-35%, diff HK, gr1 DD, mild AI, mild to mod Thomas/TR, mod dil LA, nl RV, mod to sev PAH;  c. 04/2016 Echo: EF 20-25%, sev dil LV, diff HK, gr3 DD, mod AI, MV syst bowing w/ prolapse, mod dil RV, mod TR.  [name] Dialysis patient ([place])    Mon, Wed, Fri  . Dyspnea   . ESRD (end  stage renal disease) ([place])    a. MWF dialysis @ [facility].  . Hepatitis    Patient is unsure type of Hepatitis [name] has  . Hypertension   . Hypertensive heart disease with CHF (congestive heart failure) ([place])   . Peripheral vascular disease ([place])   . Pulmonary hypertension ([place]) 04/2016  . Renal insufficiency    Past Surgical History:  Procedure Laterality Date  . A/V SHUNT INTERVENTION N/A 04/06/2016   Procedure: A/V Shunt Intervention;  Surgeon: Katha Cabal, MD;  Location: [place] CV LAB;  Service: Cardiovascular;  Laterality: N/A;  . A/V SHUNT INTERVENTION N/A 06/29/2016   Procedure: A/V Shunt Intervention;  Surgeon: Katha Cabal, MD;  Location: [place] CV LAB;  Service: Cardiovascular;  Laterality: N/A;  . A/V SHUNTOGRAM Left 04/06/2016   Procedure: A/V Fistulagram;  Surgeon: Katha Cabal, MD;  Location: [place] CV LAB;  Service: Cardiovascular;  Laterality: Left;  . AV FISTULA PLACEMENT Left 2014  . DIALYSIS/PERMA CATHETER INSERTION Right    Dr. Delana Meyer  . PERIPHERAL VASCULAR CATHETERIZATION N/A 10/03/2014   Procedure: A/V Shuntogram/Fistulagram;  Surgeon: Algernon Huxley, MD;  Location: [place] CV LAB;  Service: Cardiovascular;  Laterality: N/A;  . PERIPHERAL VASCULAR CATHETERIZATION Left 10/03/2014   Procedure: A/V Shunt Intervention;  Surgeon: Algernon Huxley, MD;  Location: [place] CV LAB;  Service: Cardiovascular;  Laterality: Left;  . PERIPHERAL VASCULAR CATHETERIZATION Left  05/01/2015   Procedure: A/V Shuntogram/Fistulagram;  Surgeon: Algernon Huxley, MD;  Location: [place] CV LAB;  Service: Cardiovascular;  Laterality: Left;  . PERIPHERAL VASCULAR CATHETERIZATION N/A 05/01/2015   Procedure: A/V Shunt Intervention;  Surgeon: Algernon Huxley, MD;  Location: [place] CV LAB;  Service: Cardiovascular;  Laterality: N/A;  . UPPER EXTREMITY ANGIOGRAPHY Bilateral 06/29/2016   Procedure: Upper Extremity Angiography;  Surgeon: Katha Cabal,  MD;  Location: [place] CV LAB;  Service: Cardiovascular;  Laterality: Bilateral;  . UPPER EXTREMITY INTERVENTION  06/29/2016   Procedure: Upper Extremity Intervention;  Surgeon: Katha Cabal, MD;  Location: [place] CV LAB;  Service: Cardiovascular;;   Family History  Problem Relation Age of Onset  . Hypertension Son   . Diabetes Son   . Hypertension Mother   . Diabetes Mother   . Diabetes Sister    Social History  Substance Use Topics  . Smoking status: Current Some Day Smoker    Packs/day: 0.25    Years: 40.00    Types: Cigarettes  . Smokeless tobacco: Never Used  . Alcohol use No   Allergies  Allergen Reactions  . Sulfa Antibiotics Rash   Prior to Admission medications   Medication Sig Start Date End Date Taking? Authorizing Provider  albuterol (PROVENTIL HFA;VENTOLIN HFA) 108 (90 Base) MCG/ACT inhaler Inhale into the lungs every 4 (four) hours as needed for wheezing or shortness of breath.   Yes [provider]  calcium acetate (PHOSLO) 667 MG capsule Take 1,334-2,001 mg by mouth 3 (three) times daily with meals. Take 2001 mgs with meals 3 times daily and 1334 with snacks 12/11/15  Yes [provider]  Calcium Carbonate Antacid (TUMS ULTRA 1000 PO) Take 2 tablets by mouth 3 (three) times daily before meals.   Yes [provider]  carvedilol (COREG) 6.25 MG tablet Take 1 tablet (6.25 mg total) by mouth 2 (two) times daily with a meal. 05/01/16  Yes Mody, Sital, MD  cinacalcet (SENSIPAR) 90 MG tablet Take 90 mg by mouth daily.   Yes [provider]  hydrALAZINE (APRESOLINE) 50 MG tablet Take 100 tablets by mouth 3 (three) times daily.  07/01/16  Yes [provider]  ipratropium (ATROVENT HFA) 17 MCG/ACT inhaler Inhale 2 puffs into the lungs every 4 (four) hours as needed for wheezing.   Yes [provider]  isosorbide mononitrate (IMDUR) 30 MG 24 hr tablet Take 1 tablet (30 mg total) by mouth daily. 08/24/16 11/22/16  Yes Dustin Flock, MD  lidocaine-prilocaine (EMLA) cream Apply 1 application topically as needed (dialysis access).   Yes [provider]  losartan (COZAAR) 25 MG tablet Take 25 mg by mouth daily.  06/17/16  Yes [provider]    Review of Systems  Constitutional: Positive for fatigue. Negative for appetite change.  HENT: Negative for congestion, postnasal drip and sore throat.   Eyes: Negative.   Respiratory: Positive for shortness of breath. Negative for cough, chest tightness and wheezing.   Cardiovascular: Positive for leg swelling. Negative for chest pain and palpitations.  Gastrointestinal: Negative for abdominal distention and abdominal pain.  Endocrine: Negative.   Genitourinary: Negative.   Musculoskeletal: Negative for back pain and neck pain.  Skin: Negative.   Allergic/Immunologic: Negative.   Neurological: Positive for light-headedness. Negative for dizziness.  Hematological: Negative for adenopathy. Does not bruise/bleed easily.  Psychiatric/Behavioral: Positive for sleep disturbance (unable to sleep flat due to shortness of breath). Negative for dysphoric mood. The patient is nervous/anxious.  Vitals:   09/07/16 1228  BP: (!) 148/62  Pulse: 84  Resp: 20  SpO2: 93%  Weight: 170 lb 4 oz (77.2 kg)  Height: 5\' 9"  (1.753 m)   Wt Readings from Last 3 Encounters:  09/07/16 170 lb 4 oz (77.2 kg)  08/24/16 166 lb 12.8 oz (75.7 kg)  07/30/16 173 lb (78.5 kg)   Lab Results  Component Value Date   CREATININE 6.38 (H) 08/24/2016   CREATININE 9.09 (H) 08/23/2016   CREATININE 8.27 (H) 08/22/2016   Physical Exam  Constitutional: [name] is oriented to person, place, and time. [name] appears well-developed and well-nourished.  HENT:  Head: Normocephalic and atraumatic.  Neck: Normal range of motion. Neck supple. No JVD present.  Cardiovascular: Normal rate and regular rhythm.   Pulmonary/Chest: Effort normal. [name] has no wheezes. [name] has no rales.  Abdominal:  Soft. [name] exhibits no distension. There is no tenderness.  Musculoskeletal: [name] exhibits edema (2+ pitting edema in bilateral lower legs with L>R). [name] exhibits no tenderness.  Neurological: [name] is alert and oriented to person, place, and time.  Skin: Skin is warm and dry.  Psychiatric: [name] has a normal mood and affect. His behavior is normal. Thought content normal.  Nursing note and vitals reviewed.  Assessment & Plan:  1: Chronic heart failure with reduced ejection fraction- - NYHA class III - moderately fluid overloaded today - has not been weighing daily and [name] was encouraged to resume so that [name] can call for an overnight weight gain of >2 pounds or a weekly weight gain of >5 pounds - not adding salt and trying to follow a low sodium diet - has been on lasix before but did not respond. Will try torsemide 20mg  daily.  - spoke with pharmacist at [facility] regarding torsemide and sulfa allergy. Since [name] had no allergic response to lasix, torsemide should hopefully be tolerated. Pharmacist says that she will educate patient well regarding possible cross over allergy. - patient currently on losartan; message left at Dr Keturah Barre office regarding his thoughts regarding switching his losartan to entresto since [name]'s already dialysis dependent. Will call patient once I hear back from Dr Candiss Norse.  - does have a history of hyperkalemia so would need to monitor closely - sees cardiologist (End) on 09/30/16  2: HTN- - BP looks good today  3: Chronic kidney disease- - does dialysis M, W, F  4: Tobacco use- - continues to smoke  - cessation discussed for 3 minutes with him  Medication bottles were reviewed.  Return in 1 month or sooner for any questions/problems before then.

## 2016-09-07 NOTE — Patient Instructions (Signed)
Resume weighing daily and call for an overnight weight gain of > 2 pounds or a weekly weight gain of >5 pounds. 

## 2016-09-08 DIAGNOSIS — I5022 Chronic systolic (congestive) heart failure: Secondary | ICD-10-CM | POA: Insufficient documentation

## 2016-09-10 ENCOUNTER — Telehealth: Payer: Self-pay | Admitting: Family

## 2016-09-10 NOTE — Telephone Encounter (Signed)
Dr. Keturah Barre nurse called back to say that [place] said we could stop the losartan and begin entresto in this patient. Will discuss with patient at his next office visit.

## 2016-09-19 ENCOUNTER — Emergency Department: Payer: Medicare Other

## 2016-09-19 ENCOUNTER — Inpatient Hospital Stay
Admission: EM | Admit: 2016-09-19 | Discharge: 2016-09-21 | DRG: 291 | Disposition: A | Discharge status: Home Health | Payer: Medicare Other | Attending: Internal Medicine | Admitting: Internal Medicine

## 2016-09-19 DIAGNOSIS — J811 Chronic pulmonary edema: Secondary | ICD-10-CM | POA: Diagnosis present

## 2016-09-19 DIAGNOSIS — I083 Combined rheumatic disorders of mitral, aortic and tricuspid valves: Secondary | ICD-10-CM | POA: Diagnosis present

## 2016-09-19 DIAGNOSIS — N186 End stage renal disease: Secondary | ICD-10-CM | POA: Diagnosis present

## 2016-09-19 DIAGNOSIS — Z79899 Other long term (current) drug therapy: Secondary | ICD-10-CM

## 2016-09-19 DIAGNOSIS — I5043 Acute on chronic combined systolic (congestive) and diastolic (congestive) heart failure: Secondary | ICD-10-CM | POA: Diagnosis present

## 2016-09-19 DIAGNOSIS — I5023 Acute on chronic systolic (congestive) heart failure: Secondary | ICD-10-CM | POA: Insufficient documentation

## 2016-09-19 DIAGNOSIS — R0602 Shortness of breath: Secondary | ICD-10-CM

## 2016-09-19 DIAGNOSIS — K759 Inflammatory liver disease, unspecified: Secondary | ICD-10-CM | POA: Diagnosis present

## 2016-09-19 DIAGNOSIS — J9601 Acute respiratory failure with hypoxia: Secondary | ICD-10-CM | POA: Diagnosis present

## 2016-09-19 DIAGNOSIS — R0902 Hypoxemia: Secondary | ICD-10-CM

## 2016-09-19 DIAGNOSIS — I272 Pulmonary hypertension, unspecified: Secondary | ICD-10-CM | POA: Diagnosis present

## 2016-09-19 DIAGNOSIS — E8770 Fluid overload, unspecified: Secondary | ICD-10-CM | POA: Diagnosis present

## 2016-09-19 DIAGNOSIS — E875 Hyperkalemia: Secondary | ICD-10-CM | POA: Diagnosis present

## 2016-09-19 DIAGNOSIS — I10 Essential (primary) hypertension: Secondary | ICD-10-CM | POA: Diagnosis present

## 2016-09-19 DIAGNOSIS — Z992 Dependence on renal dialysis: Secondary | ICD-10-CM | POA: Diagnosis not present

## 2016-09-19 DIAGNOSIS — I739 Peripheral vascular disease, unspecified: Secondary | ICD-10-CM | POA: Diagnosis present

## 2016-09-19 DIAGNOSIS — Z882 Allergy status to sulfonamides status: Secondary | ICD-10-CM

## 2016-09-19 DIAGNOSIS — I132 Hypertensive heart and chronic kidney disease with heart failure and with stage 5 chronic kidney disease, or end stage renal disease: Principal | ICD-10-CM | POA: Diagnosis present

## 2016-09-19 DIAGNOSIS — R079 Chest pain, unspecified: Secondary | ICD-10-CM | POA: Diagnosis present

## 2016-09-19 DIAGNOSIS — N2581 Secondary hyperparathyroidism of renal origin: Secondary | ICD-10-CM | POA: Diagnosis present

## 2016-09-19 DIAGNOSIS — Z833 Family history of diabetes mellitus: Secondary | ICD-10-CM | POA: Diagnosis not present

## 2016-09-19 DIAGNOSIS — Z765 Malingerer [conscious simulation]: Secondary | ICD-10-CM

## 2016-09-19 DIAGNOSIS — F1410 Cocaine abuse, uncomplicated: Secondary | ICD-10-CM | POA: Diagnosis not present

## 2016-09-19 DIAGNOSIS — R7989 Other specified abnormal findings of blood chemistry: Secondary | ICD-10-CM | POA: Diagnosis present

## 2016-09-19 DIAGNOSIS — D631 Anemia in chronic kidney disease: Secondary | ICD-10-CM | POA: Diagnosis present

## 2016-09-19 DIAGNOSIS — I25118 Atherosclerotic heart disease of native coronary artery with other forms of angina pectoris: Secondary | ICD-10-CM | POA: Diagnosis not present

## 2016-09-19 DIAGNOSIS — Z8249 Family history of ischemic heart disease and other diseases of the circulatory system: Secondary | ICD-10-CM | POA: Diagnosis not present

## 2016-09-19 DIAGNOSIS — R0781 Pleurodynia: Secondary | ICD-10-CM | POA: Diagnosis present

## 2016-09-19 DIAGNOSIS — F17210 Nicotine dependence, cigarettes, uncomplicated: Secondary | ICD-10-CM | POA: Diagnosis present

## 2016-09-19 LAB — BASIC METABOLIC PANEL
Anion gap: 15 (ref 5–15)
BUN: 76 mg/dL — AB (ref 6–20)
CO2: 21 mmol/L — AB (ref 22–32)
CREATININE: 9.19 mg/dL — AB (ref 0.61–1.24)
Calcium: 9.4 mg/dL (ref 8.9–10.3)
Chloride: 103 mmol/L (ref 101–111)
GFR calc Af Amer: 6 mL/min — ABNORMAL LOW (ref >60)
GFR calc non Af Amer: 5 mL/min — ABNORMAL LOW (ref >60)
GLUCOSE: 85 mg/dL (ref 65–99)
POTASSIUM: 5.5 mmol/L — AB (ref 3.5–5.1)
Sodium: 139 mmol/L (ref 135–145)

## 2016-09-19 LAB — CBC
HEMATOCRIT: 32.3 % — AB (ref 40.0–52.0)
Hemoglobin: 10.6 g/dL — ABNORMAL LOW (ref 13.0–18.0)
MCH: 30.7 pg (ref 26.0–34.0)
MCHC: 32.8 g/dL (ref 32.0–36.0)
MCV: 93.7 fL (ref 80.0–100.0)
PLATELETS: 218 10*3/uL (ref 150–440)
RBC: 3.45 MIL/uL — ABNORMAL LOW (ref 4.40–5.90)
RDW: 16.9 % — AB (ref 11.5–14.5)
WBC: 7.1 10*3/uL (ref 3.8–10.6)

## 2016-09-19 LAB — TROPONIN I
Troponin I: 0.18 ng/mL (ref <0.03)
Troponin I: 0.19 ng/mL (ref <0.03)

## 2016-09-19 MED ORDER — FUROSEMIDE 10 MG/ML IJ SOLN
80.0000 mg | Freq: Once | INTRAMUSCULAR | Status: AC
  Administered 2016-09-19: 80 mg via INTRAVENOUS
  Filled 2016-09-19: qty 8

## 2016-09-19 MED ORDER — IPRATROPIUM-ALBUTEROL 0.5-2.5 (3) MG/3ML IN SOLN
3.0000 mL | Freq: Once | RESPIRATORY_TRACT | Status: AC
  Administered 2016-09-19: 3 mL via RESPIRATORY_TRACT
  Filled 2016-09-19: qty 3

## 2016-09-19 MED ORDER — LABETALOL HCL 5 MG/ML IV SOLN
10.0000 mg | INTRAVENOUS | Status: DC | PRN
  Administered 2016-09-20: 10 mg via INTRAVENOUS
  Filled 2016-09-19: qty 4

## 2016-09-19 NOTE — H&P (Signed)
[facility] NAME: [name]    MR#:  [id number]  DATE OF BIRTH:  1956/02/21  DATE OF ADMISSION:  09/19/2016  PRIMARY CARE PHYSICIAN: [facility]   REQUESTING/REFERRING PHYSICIAN: Archie Balboa, MD  CHIEF COMPLAINT:   Chief Complaint  Patient presents with  . Shortness of Breath  . Chest Pain    HISTORY OF PRESENT ILLNESS:  [name]  is a 61 y.o. male who presents with Significant shortness of breath. Patient states this is been building for the last couple of days. He also has significant lower extremity swelling. He is an end-stage renal patient on dialysis. He also has heart failure. Workup here in the ED tonight indicates exacerbation of his heart failure with pulmonary vascular congestion on imaging. Hospitalists were called for admission. Of note, he has an elevated troponin tonight, but he has he has a chronically elevated troponin to the same level.  PAST MEDICAL HISTORY:   Past Medical History:  Diagnosis Date  . Arthritis   . Chest pain    a. 09/2013 Myoview [facility]): basal inf defect more pronounced @ rest, likely artifact, EF 48%, low risk study.  . Chronic combined systolic and diastolic CHF (congestive heart failure) ([place])    a. 10/2013 Echo [facility]): EF 50%, diast dysfxn, triv AI/TR, mild MR;  b. 10/2014 Echo: EF 30-35%, diff HK, gr1 DD, mild AI, mild to mod MR/TR, mod dil LA, nl RV, mod to sev PAH;  c. 04/2016 Echo: EF 20-25%, sev dil LV, diff HK, gr3 DD, mod AI, MV syst bowing w/ prolapse, mod dil RV, mod TR.  [name] Dialysis patient ([place])    Mon, Wed, Fri  . Dyspnea   . ESRD (end stage renal disease) ([place])    a. MWF dialysis @ [facility].  . Hepatitis    Patient is unsure type of Hepatitis he has  . Hypertension   . Hypertensive heart disease with CHF (congestive heart failure) ([place])   . Peripheral vascular disease ([place])   . Pulmonary hypertension ([place]) 04/2016  . Renal  insufficiency     PAST SURGICAL HISTORY:   Past Surgical History:  Procedure Laterality Date  . A/V SHUNT INTERVENTION N/A 04/06/2016   Procedure: A/V Shunt Intervention;  Surgeon: Katha Cabal, MD;  Location: [place] CV LAB;  Service: Cardiovascular;  Laterality: N/A;  . A/V SHUNT INTERVENTION N/A 06/29/2016   Procedure: A/V Shunt Intervention;  Surgeon: Katha Cabal, MD;  Location: [place] CV LAB;  Service: Cardiovascular;  Laterality: N/A;  . A/V SHUNTOGRAM Left 04/06/2016   Procedure: A/V Fistulagram;  Surgeon: Katha Cabal, MD;  Location: [place] CV LAB;  Service: Cardiovascular;  Laterality: Left;  . AV FISTULA PLACEMENT Left 2014  . DIALYSIS/PERMA CATHETER INSERTION Right    Dr. Delana Meyer  . PERIPHERAL VASCULAR CATHETERIZATION N/A 10/03/2014   Procedure: A/V Shuntogram/Fistulagram;  Surgeon: Algernon Huxley, MD;  Location: [place] CV LAB;  Service: Cardiovascular;  Laterality: N/A;  . PERIPHERAL VASCULAR CATHETERIZATION Left 10/03/2014   Procedure: A/V Shunt Intervention;  Surgeon: Algernon Huxley, MD;  Location: [place] CV LAB;  Service: Cardiovascular;  Laterality: Left;  . PERIPHERAL VASCULAR CATHETERIZATION Left 05/01/2015   Procedure: A/V Shuntogram/Fistulagram;  Surgeon: Algernon Huxley, MD;  Location: [place] CV LAB;  Service: Cardiovascular;  Laterality: Left;  . PERIPHERAL VASCULAR CATHETERIZATION N/A 05/01/2015   Procedure: A/V Shunt Intervention;  Surgeon: Algernon Huxley, MD;  Location: [place] CV LAB;  Service: Cardiovascular;  Laterality: N/A;  . UPPER EXTREMITY ANGIOGRAPHY Bilateral 06/29/2016   Procedure: Upper Extremity Angiography;  Surgeon: Katha Cabal, MD;  Location: [place] [facility];  Service: Cardiovascular;  Laterality: Bilateral;  . UPPER EXTREMITY INTERVENTION  06/29/2016   Procedure: Upper Extremity Intervention;  Surgeon: Katha Cabal, MD;  Location: [place] CV LAB;  Service: Cardiovascular;;    SOCIAL  HISTORY:   Social History  Substance Use Topics  . Smoking status: Current Some Day Smoker    Packs/day: 0.25    Years: 40.00    Types: Cigarettes  . Smokeless tobacco: Never Used  . Alcohol use No    FAMILY HISTORY:   Family History  Problem Relation Age of Onset  . Hypertension Son   . Diabetes Son   . Hypertension Mother   . Diabetes Mother   . Diabetes Sister     DRUG ALLERGIES:   Allergies  Allergen Reactions  . Sulfa Antibiotics Rash    MEDICATIONS AT HOME:   Prior to Admission medications   Medication Sig Start Date End Date Taking? Authorizing Provider  albuterol (PROVENTIL HFA;VENTOLIN HFA) 108 (90 Base) MCG/ACT inhaler Inhale into the lungs every 4 (four) hours as needed for wheezing or shortness of breath.    [provider]  calcium acetate (PHOSLO) 667 MG capsule Take 1,334-2,001 mg by mouth 3 (three) times daily with meals. Take 2001 mgs with meals 3 times daily and 1334 with snacks 12/11/15   [provider]  Calcium Carbonate Antacid (TUMS ULTRA 1000 PO) Take 2 tablets by mouth 3 (three) times daily before meals.    [provider]  carvedilol (COREG) 6.25 MG tablet Take 1 tablet (6.25 mg total) by mouth 2 (two) times daily with a meal. 05/01/16   Bettey Costa, MD  cinacalcet (SENSIPAR) 90 MG tablet Take 90 mg by mouth daily.    [provider]  hydrALAZINE (APRESOLINE) 50 MG tablet Take 100 mg by mouth 3 (three) times daily.  07/01/16   [provider]  ipratropium (ATROVENT HFA) 17 MCG/ACT inhaler Inhale 2 puffs into the lungs every 4 (four) hours as needed for wheezing.    [provider]  isosorbide mononitrate (IMDUR) 30 MG 24 hr tablet Take 1 tablet (30 mg total) by mouth daily. 08/24/16 11/22/16  Dustin Flock, MD  lidocaine-prilocaine (EMLA) cream Apply 1 application topically as needed (dialysis access).    [provider]  losartan (COZAAR) 25 MG tablet Take 25 mg by mouth daily.  06/17/16    [provider]  torsemide (DEMADEX) 20 MG tablet Take 1 tablet (20 mg total) by mouth daily. 09/07/16 10/07/16  Alisa Graff, FNP    REVIEW OF SYSTEMS:  Review of Systems  Constitutional: Negative for chills, fever, malaise/fatigue and weight loss.  HENT: Negative for ear pain, hearing loss and tinnitus.   Eyes: Negative for blurred vision, double vision, pain and redness.  Respiratory: Positive for shortness of breath. Negative for cough and hemoptysis.   Cardiovascular: Positive for chest pain and leg swelling. Negative for palpitations and orthopnea.  Gastrointestinal: Negative for abdominal pain, constipation, diarrhea, nausea and vomiting.  Genitourinary: Negative for dysuria, frequency and hematuria.  Musculoskeletal: Negative for back pain, joint pain and neck pain.  Skin:       No acne, rash, or lesions  Neurological: Negative for dizziness, tremors, focal weakness and weakness.  Endo/Heme/Allergies: Negative for polydipsia. Does not bruise/bleed easily.  Psychiatric/Behavioral: Negative for depression. The patient is not nervous/anxious and does not have insomnia.      VITAL SIGNS:   Vitals:   09/19/16 2245 09/19/16 2300 09/19/16 2315 09/19/16 2330  BP: (!) 174/101 (!) 170/126 (!) 167/119 (!) 167/109  Pulse: 87 88 80 86  Resp: (!) 36 (!) 25 17 (!) 34  Temp:      TempSrc:      SpO2: 100% 100% 96% 94%  Weight:      Height:       Wt Readings from Last 3 Encounters:  09/19/16 77.1 kg (170 lb)  09/07/16 77.2 kg (170 lb 4 oz)  08/24/16 75.7 kg (166 lb 12.8 oz)    PHYSICAL EXAMINATION:  Physical Exam  Vitals reviewed. Constitutional: He is oriented to person, place, and time. He appears well-developed and well-nourished. No distress.  HENT:  Head: Normocephalic and atraumatic.  Mouth/Throat: Oropharynx is clear and moist.  Eyes: Pupils are equal, round, and reactive to light. Conjunctivae and EOM are normal. No scleral icterus.  Neck: Normal range of  motion. Neck supple. No JVD present. No thyromegaly present.  Cardiovascular: Normal rate, regular rhythm and intact distal pulses.  Exam reveals no gallop and no friction rub.   No murmur heard. Respiratory: Effort normal. No respiratory distress. He has no wheezes. He has rales.  GI: Soft. Bowel sounds are normal. He exhibits no distension. There is no tenderness.  Musculoskeletal: Normal range of motion. He exhibits edema.  No arthritis, no gout  Lymphadenopathy:    He has no cervical adenopathy.  Neurological: He is alert and oriented to person, place, and time. No cranial nerve deficit.  No dysarthria, no aphasia  Skin: Skin is warm and dry. No rash noted. No erythema.  Psychiatric: He has a normal mood and affect. His behavior is normal. Judgment and thought content normal.    LABORATORY PANEL:   CBC  Recent Labs Lab 09/19/16 1856  WBC 7.1  HGB 10.6*  HCT 32.3*  PLT 218   ------------------------------------------------------------------------------------------------------------------  Chemistries   Recent Labs Lab 09/19/16 1856  NA 139  K 5.5*  CL 103  CO2 21*  GLUCOSE 85  BUN 76*  CREATININE 9.19*  CALCIUM 9.4   ------------------------------------------------------------------------------------------------------------------  Cardiac Enzymes  Recent Labs Lab 09/19/16 2215  TROPONINI 0.19*   ------------------------------------------------------------------------------------------------------------------  RADIOLOGY:  Dg Chest 2 View  Result Date: 09/19/2016 CLINICAL DATA:  Chest pain.  Shortness of breath. EXAM: CHEST  2 VIEW COMPARISON:  08/22/2016 FINDINGS: Large bore right-sided central venous catheter is stable. Enlarged cardiac silhouette. Calcific atherosclerotic disease of the aorta. There is no evidence of focal airspace consolidation, pleural effusion or pneumothorax. Mild pulmonary vascular congestion. Osseous structures are without acute  abnormality. Soft tissues are grossly normal. IMPRESSION: Markedly enlarged cardiac silhouette, stable. Pulmonary vascular congestion. Electronically Signed   By: Fidela Salisbury M.D.   On: 09/19/2016 20:20    EKG:   Orders placed or performed during the hospital encounter of 09/19/16  . ED EKG within 10 minutes  . ED EKG within 10 minutes    IMPRESSION AND PLAN:  Principal Problem:   Acute on chronic systolic CHF (congestive heart failure) (HCC) - IV Lasix given, echocardiogram and cardiology consult ordered Active Problems:   Hyperkalemia - IV Lasix given as above, potassium is only mildly elevated, monitor   HTN (hypertension) - continue home meds, additional when necessary antihypertensives for blood pressure control   Chest pain - secondary to his heart failure exacerbation,  other workup as above   ESRD (end stage renal disease) on dialysis [facility]) - nephrology consult for dialysis  All the records are reviewed and case discussed with ED provider. Management plans discussed with the patient and/or family.  DVT PROPHYLAXIS: SubQ heparin  GI PROPHYLAXIS: None  ADMISSION STATUS: Inpatient  CODE STATUS: Full Code Status History    Date Active Date Inactive Code Status Order ID Comments User Context   08/22/2016  4:58 PM 08/24/2016  4:07 PM Full Code [id number]  Demetrios Loll, MD Inpatient   07/04/2016  7:29 PM 07/05/2016  5:28 PM Full Code [id number]  Idelle Crouch, MD Inpatient   06/07/2016  1:02 AM 06/08/2016  3:14 PM Full Code [id number]  Hugelmeyer, [place], DO Inpatient   04/30/2016 12:07 AM 05/01/2016  6:21 PM Full Code [id number]  Lance Coon, MD Inpatient   02/26/2016  9:07 PM 02/28/2016  6:04 PM Full Code [id number]  Lance Coon, MD Inpatient   01/07/2016  1:32 AM 01/07/2016  5:24 PM Full Code [id number]  Hugelmeyer, Alexis, DO ED   08/02/2015 11:16 PM 08/04/2015 11:11 PM Full Code [id number]  Quintella Baton, MD Inpatient   12/02/2014  8:52 PM 12/04/2014  5:53 PM Full Code [id number]   Vaughan Basta, MD Inpatient   11/10/2014 11:23 PM 11/12/2014  3:06 PM Full Code [id number]  Gladstone Lighter, MD Inpatient    Advance Directive Documentation     Most Recent Value  Type of Advance Directive  Healthcare Power of Attorney  Pre-existing out of facility DNR order (yellow form or pink MOST form)  -  "MOST" Form in Place?  -      TOTAL TIME TAKING CARE OF THIS PATIENT: 45 minutes.   Om Lizotte [place] 09/19/2016, 11:39 PM  [facility]  Office  [phone]  CC: Primary care physician; [facility], [place]  Note:  This document was prepared using Systems analyst and may include unintentional dictation errors.
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
## 2016-09-19 NOTE — ED Triage Notes (Signed)
Pt came to ED via EMS c/o chest pain, sob for 2-3 days. Pt is dialysis pt, had dialysis Friday. Fistula in left arm. Placed on [place] by EMS, satting 98%.

## 2016-09-20 DIAGNOSIS — I10 Essential (primary) hypertension: Secondary | ICD-10-CM

## 2016-09-20 DIAGNOSIS — F1410 Cocaine abuse, uncomplicated: Secondary | ICD-10-CM

## 2016-09-20 DIAGNOSIS — I25118 Atherosclerotic heart disease of native coronary artery with other forms of angina pectoris: Secondary | ICD-10-CM

## 2016-09-20 DIAGNOSIS — Z992 Dependence on renal dialysis: Secondary | ICD-10-CM

## 2016-09-20 DIAGNOSIS — N186 End stage renal disease: Secondary | ICD-10-CM

## 2016-09-20 DIAGNOSIS — R0902 Hypoxemia: Secondary | ICD-10-CM

## 2016-09-20 LAB — CBC
HCT: 33.7 % — ABNORMAL LOW (ref 40.0–52.0)
Hemoglobin: 11 g/dL — ABNORMAL LOW (ref 13.0–18.0)
MCH: 30.5 pg (ref 26.0–34.0)
MCHC: 32.6 g/dL (ref 32.0–36.0)
MCV: 93.5 fL (ref 80.0–100.0)
PLATELETS: 198 10*3/uL (ref 150–440)
RBC: 3.61 MIL/uL — ABNORMAL LOW (ref 4.40–5.90)
RDW: 17.6 % — AB (ref 11.5–14.5)
WBC: 6.9 10*3/uL (ref 3.8–10.6)

## 2016-09-20 LAB — BASIC METABOLIC PANEL
Anion gap: 15 (ref 5–15)
BUN: 83 mg/dL — AB (ref 6–20)
CHLORIDE: 105 mmol/L (ref 101–111)
CO2: 21 mmol/L — AB (ref 22–32)
CREATININE: 10.22 mg/dL — AB (ref 0.61–1.24)
Calcium: 9.4 mg/dL (ref 8.9–10.3)
GFR calc Af Amer: 5 mL/min — ABNORMAL LOW (ref >60)
GFR calc non Af Amer: 5 mL/min — ABNORMAL LOW (ref >60)
Glucose, Bld: 140 mg/dL — ABNORMAL HIGH (ref 65–99)
Potassium: 6.9 mmol/L (ref 3.5–5.1)
Sodium: 141 mmol/L (ref 135–145)

## 2016-09-20 LAB — POTASSIUM: POTASSIUM: 3.7 mmol/L (ref 3.5–5.1)

## 2016-09-20 LAB — MRSA PCR SCREENING: MRSA by PCR: NEGATIVE

## 2016-09-20 MED ORDER — LOSARTAN POTASSIUM 25 MG PO TABS
25.0000 mg | ORAL_TABLET | Freq: Every day | ORAL | Status: DC
  Administered 2016-09-20 – 2016-09-21 (×2): 25 mg via ORAL
  Filled 2016-09-20 (×2): qty 1

## 2016-09-20 MED ORDER — HYDROCODONE-ACETAMINOPHEN 5-325 MG PO TABS
1.0000 | ORAL_TABLET | Freq: Once | ORAL | Status: AC
  Administered 2016-09-20: 1 via ORAL
  Filled 2016-09-20: qty 1

## 2016-09-20 MED ORDER — HYDROCODONE-ACETAMINOPHEN 5-325 MG PO TABS
1.0000 | ORAL_TABLET | ORAL | Status: DC | PRN
  Administered 2016-09-20 – 2016-09-21 (×6): 1 via ORAL
  Filled 2016-09-20 (×6): qty 1

## 2016-09-20 MED ORDER — HEPARIN SODIUM (PORCINE) 5000 UNIT/ML IJ SOLN
5000.0000 [IU] | Freq: Three times a day (TID) | INTRAMUSCULAR | Status: DC
  Administered 2016-09-20 – 2016-09-21 (×4): 5000 [IU] via SUBCUTANEOUS
  Filled 2016-09-20 (×4): qty 1

## 2016-09-20 MED ORDER — ACETAMINOPHEN 650 MG RE SUPP: 650.0000 mg | Freq: Four times a day (QID) | RECTAL | Status: DC | PRN

## 2016-09-20 MED ORDER — ACETAMINOPHEN 325 MG PO TABS: 650.0000 mg | ORAL_TABLET | Freq: Four times a day (QID) | ORAL | Status: DC | PRN

## 2016-09-20 MED ORDER — HYDRALAZINE HCL 50 MG PO TABS
100.0000 mg | ORAL_TABLET | Freq: Three times a day (TID) | ORAL | Status: DC
  Administered 2016-09-20 – 2016-09-21 (×3): 100 mg via ORAL
  Filled 2016-09-20 (×3): qty 2

## 2016-09-20 MED ORDER — CARVEDILOL 6.25 MG PO TABS
6.2500 mg | ORAL_TABLET | Freq: Two times a day (BID) | ORAL | Status: DC
  Administered 2016-09-20 – 2016-09-21 (×3): 6.25 mg via ORAL
  Filled 2016-09-20 (×3): qty 1

## 2016-09-20 MED ORDER — SODIUM CHLORIDE 0.9% FLUSH
3.0000 mL | Freq: Two times a day (BID) | INTRAVENOUS | Status: DC
  Administered 2016-09-20 – 2016-09-21 (×3): 3 mL via INTRAVENOUS

## 2016-09-20 MED ORDER — SODIUM CHLORIDE 0.9 % IV SOLN
1.0000 g | Freq: Once | INTRAVENOUS | Status: AC
  Administered 2016-09-20: 1 g via INTRAVENOUS
  Filled 2016-09-20: qty 10

## 2016-09-20 MED ORDER — TORSEMIDE 20 MG PO TABS
20.0000 mg | ORAL_TABLET | Freq: Every day | ORAL | Status: DC
  Administered 2016-09-20 – 2016-09-21 (×2): 20 mg via ORAL
  Filled 2016-09-20 (×2): qty 1

## 2016-09-20 MED ORDER — INSULIN ASPART 100 UNIT/ML IV SOLN
10.0000 [IU] | Freq: Once | INTRAVENOUS | Status: AC
  Administered 2016-09-20: 10 [IU] via INTRAVENOUS
  Filled 2016-09-20: qty 0.1

## 2016-09-20 MED ORDER — IPRATROPIUM-ALBUTEROL 0.5-2.5 (3) MG/3ML IN SOLN
3.0000 mL | RESPIRATORY_TRACT | Status: DC | PRN
Start: 2016-09-20 — End: 2016-09-21

## 2016-09-20 MED ORDER — ISOSORBIDE MONONITRATE ER 60 MG PO TB24
30.0000 mg | ORAL_TABLET | Freq: Every day | ORAL | Status: DC
  Administered 2016-09-20 – 2016-09-21 (×2): 30 mg via ORAL
  Filled 2016-09-20 (×2): qty 1

## 2016-09-20 MED ORDER — ONDANSETRON HCL 4 MG PO TABS: 4.0000 mg | ORAL_TABLET | Freq: Four times a day (QID) | ORAL | Status: DC | PRN

## 2016-09-20 MED ORDER — ONDANSETRON HCL 4 MG/2ML IJ SOLN
4.0000 mg | Freq: Four times a day (QID) | INTRAMUSCULAR | Status: DC | PRN
  Administered 2016-09-20 (×2): 4 mg via INTRAVENOUS
  Filled 2016-09-20 (×2): qty 2

## 2016-09-20 MED ORDER — CINACALCET HCL 30 MG PO TABS
90.0000 mg | ORAL_TABLET | Freq: Every day | ORAL | Status: DC
  Administered 2016-09-20 – 2016-09-21 (×2): 90 mg via ORAL
  Filled 2016-09-20 (×2): qty 3

## 2016-09-20 MED ORDER — CALCIUM ACETATE (PHOS BINDER) 667 MG PO CAPS
1334.0000 mg | ORAL_CAPSULE | Freq: Three times a day (TID) | ORAL | Status: DC
  Administered 2016-09-20 – 2016-09-21 (×5): 1334 mg via ORAL
  Filled 2016-09-20 (×4): qty 2

## 2016-09-20 MED ORDER — DEXTROSE 50 % IV SOLN
25.0000 mL | Freq: Once | INTRAVENOUS | Status: AC
  Administered 2016-09-20: 25 mL via INTRAVENOUS
  Filled 2016-09-20: qty 50

## 2016-09-20 NOTE — Progress Notes (Signed)
Pre hd assessment  

## 2016-09-20 NOTE — Progress Notes (Signed)
Pt complains of feeling short of breath and having pain in right chest around HD cath. Oxygen saturation is 95% on 2L via [place], and lungs are clear to ascultation. MD notified, instructed to monitor and to give a one time dose of pain medication if pain persist. I will continue to assess.

## 2016-09-20 NOTE — Progress Notes (Signed)
Initially placed patient on auto cpap per md order.  Patient noted to have increased respirations and requested to come off therapy. Stated it was too hard for him to breath. Transitioned patient over to bipap 10/5.  Patient tolerated therapy much better. Informed Dr Marcille Blanco of change.

## 2016-09-20 NOTE — Care Management (Addendum)
Patient admitted with acute on chronic heart failure.  ESRD with chronic HD [facility] and says he is compliant. Notified Elvera Bicker with [facility] of admission.  Patient has been seen in the heart failure clinic. Current need for supplemental oxygen is acute.  He is on a fluid restriction.  Patient says he uses [employer] and friends for transportation.  He declines home health services saying- I am never home- I pretty much go all the time."  When asked specifically if it takes a taxing effort for him to leave home, patient stated "absolutley not."  Does not require a [place] to ambulate.  Discussed during progression of the need to perform home 02 assessment prior to discharge
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
## 2016-09-20 NOTE — Progress Notes (Signed)
A&O. Up with one assist. Respirations shallow and dyspneic with exertion. Placed on bipap. Resting quietly with eyes closed at this time. Home Medications sent down to pharmacy for storage, patient stated understanding of this policy.

## 2016-09-20 NOTE — Progress Notes (Signed)
Post hd assessment 

## 2016-09-20 NOTE — ED Notes (Signed)
Pt has been strongly urged on multiple occasions to return to the stretched, not stand unaccompanied and without assistance, discussed concerns for fall safety. Pt remains unwilling and non-compliant w/ safety requests.

## 2016-09-20 NOTE — Progress Notes (Signed)
Notified Dr. Benjie Karvonen of potassium level of 6.9. Dialysis ordered.

## 2016-09-20 NOTE — Progress Notes (Signed)
Hd start, pt alert, no c/o, stable, k+6.9, 1k bath, stat k+ q hr

## 2016-09-20 NOTE — Progress Notes (Signed)
Post hd vitals 

## 2016-09-20 NOTE — Progress Notes (Signed)
[place] [facility] [place] NAME: [name]    MR#:  [id number]  DATE OF BIRTH:  [birth year]  SUBJECTIVE:   tired feeling SOB wearing Bipap  REVIEW OF SYSTEMS:    Review of Systems  Constitutional: Negative for fever, chills weight loss HENT: Negative for ear pain, nosebleeds, congestion, facial swelling, rhinorrhea, neck pain, neck stiffness and ear discharge.   Respiratory: ++ for cough, shortness of breath, NO wheezing  Cardiovascular: Negative for chest pain, palpitations and leg swelling.  Gastrointestinal: Negative for heartburn, abdominal pain, vomiting, diarrhea or consitpation Genitourinary: Negative for dysuria, urgency, frequency, hematuria Musculoskeletal: Negative for back pain or joint pain Neurological: Negative for dizziness, seizures, syncope, focal weakness,  numbness and headaches.  Hematological: Does not bruise/bleed easily.  Psychiatric/Behavioral: Negative for hallucinations, confusion, dysphoric mood    Tolerating Diet: npo      DRUG ALLERGIES:   Allergies  Allergen Reactions  . Sulfa Antibiotics Rash    VITALS:  Blood pressure (!) 141/89, pulse 70, temperature 98 F (36.7 C), resp. rate (!) 22, height 5\' 9"  (1.753 m), weight 75.4 kg (166 lb 4.8 oz), SpO2 91 %.  PHYSICAL EXAMINATION:  Constitutional: Appears well-developed and well-nourished. No distress.wearing Bipap HENT: Normocephalic. .  Eyes: Conjunctivae and EOM are normal. PERRLA, no scleral icterus.  Neck: Normal ROM. Neck supple. No JVD. No tracheal deviation. CVS: RRR, S1/S2 +, 2/6 SEM, no gallops, no carotid bruit.  Pulmonary: Coarse breath sounds bilaterally Abdominal: Soft. BS +,  no distension, tenderness, rebound or guarding.  Musculoskeletal: Normal range of motion. No edema and no tenderness.  Neuro: Alert. CN 2-12 grossly intact. No focal deficits. Skin: Skin is warm and dry. No rash noted. vitiligo noted  Psychiatric: Normal mood and  affect.      LABORATORY PANEL:   CBC  Recent Labs Lab 09/20/16 0453  WBC 6.9  HGB 11.0*  HCT 33.7*  PLT 198   ------------------------------------------------------------------------------------------------------------------  Chemistries   Recent Labs Lab 09/20/16 0453  NA 141  K 6.9*  CL 105  CO2 21*  GLUCOSE 140*  BUN 83*  CREATININE 10.22*  CALCIUM 9.4   ------------------------------------------------------------------------------------------------------------------  Cardiac Enzymes  Recent Labs Lab 09/19/16 1856 09/19/16 2215  TROPONINI 0.18* 0.19*   ------------------------------------------------------------------------------------------------------------------  RADIOLOGY:  Dg Chest 2 View  Result Date: 09/19/2016 CLINICAL DATA:  Chest pain.  Shortness of breath. EXAM: CHEST  2 VIEW COMPARISON:  08/22/2016 FINDINGS: Large bore right-sided central venous catheter is stable. Enlarged cardiac silhouette. Calcific atherosclerotic disease of the aorta. There is no evidence of focal airspace consolidation, pleural effusion or pneumothorax. Mild pulmonary vascular congestion. Osseous structures are without acute abnormality. Soft tissues are grossly normal. IMPRESSION: Markedly enlarged cardiac silhouette, stable. Pulmonary vascular congestion. Electronically Signed   By: Fidela Salisbury M.D.   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  1. Acute hypoxic respiratory failure in the setting of acute on chronic combined systolic and diastolic heart failure Patient went BiPAP when necessary Wean off nasal cannula and BiPAP  2. 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 Hyperkalemia: Patient given insulin and dextrose morning. Discussed  with nephrology. Patient will need to go undergo emergent dialysis which is planned.  4. ESRD: Patient will need to undergo emergent dialysis  5. Elevated troponin: Patient is chronically elevated troponin due to poor renal clearance Continue to monitor Follow up on echo  6. Essential hypertension: Continue Coreg, losartan, isosorbide and hydralazine  Management plans discussed with the patient and he is in agreement.  CODE STATUS: full  TOTAL TIME TAKING CARE OF THIS PATIENT: 30 minutes.     POSSIBLE D/C 2-3 days, DEPENDING ON CLINICAL CONDITION.   Latitia Housewright M.D on 09/20/2016 at 7:13 AM  Between 7am to 6pm - Pager - [phone] After 6pm go to [email or web address] - password [email or web address] [place] [facility]  Office  [phone]  CC: Primary care physician; [facility], [place]  Note: This dictation was prepared with Diplomatic Services operational officer dictation along with smaller phrase technology. Any transcriptional errors that result from this process are unintentional.

## 2016-09-20 NOTE — Progress Notes (Signed)
Pre hd info 

## 2016-09-20 NOTE — Progress Notes (Signed)
  End of hd 

## 2016-09-20 NOTE — Consult Note (Addendum)
Cardiology Consultation Note  Patient ID: [name], MRN: [id number], DOB/AGE: 62-15-1957 62 y.o. Admit date: 09/19/2016   Date of Consult: 09/20/2016 Primary Physician: [facility], [place] Primary Cardiologist: Dr. Saunders Revel, MD Requesting Physician: Dr. Jannifer Franklin, MD  Chief Complaint: SOB Reason for Consult: Pulmonary edema, hypoxia  HPI: [name] is a 61 y.o. male who is being seen today for the evaluation of acute on chronic combined CHF at the request of Dr. Jannifer Franklin, MD. Patient has a h/o mixed NICM/ICM by [facility] in 06/2016 as detailed below, chronic combined CHF, ESRD on HD (MWF), anemia of chronic disease, pulmonary HTN, hypertensive heart disease and PVD who was admitted on 7/29 with 2-3 weeks of increased SOB.  Previously refused LHC for further evaluation of his CHF. Prior nuclear stress test in 09/2013 low risk, EF 48%. TTE 10/2013 EF 50%, DD, TTE 10/2014 EF 30-35% with diffuse HK, GR1DD, mild AI, mild to moderate MR, moderate to severe pulmonary hypertension, TTE 04/2016 EF 20-25%, diffuse HK, GR3DD, moderate AI, RV moderately dilated, moderate TR. TTE 06/2016 with EF EF 30-35%, GR2DD, mild to moderate MR, mild AI, dilated RV, severe pulmonary HTN. LHC by [facility] 06/2016 showed subtotal occlusion of proximal RCA with left-to-right collaterals, diffuse disease of the diagonal branches, LAD and LCx with mild to moderate diffuse disease 30-40%, and elevated LVEDP. His volume is managed by HD. He continues to make a small amount of urine and is on torsemide at home. Recently admitted to [facility] in early July for volume overload. He underwent HD with improvement in symptoms. Over the past 2-3 weeks he has noted increased SOB with orthopnea. Weight as been stable around 170 pounds. He reports a dry weight of 176 pounds. Upon admission he was noted to be hypoxic requiring BiPAP, now on nasal cannula. CXR with vascular congestion. Troponin mildly elevated at 0.19 (chronically elevated around this  range given his ESRD). He is for HD today. Remains orthopneic and SOB. Able to speak in 2-3 word phrases. No chest pain.   Past Medical History:  Diagnosis Date  . Arthritis   . Chest pain    a. 09/2013 Myoview [facility]): basal inf defect more pronounced @ rest, likely artifact, EF 48%, low risk study.  . Chronic combined systolic and diastolic CHF (congestive heart failure) ([place])    a. 10/2013 Echo [facility]): EF 50%, diast dysfxn, triv AI/TR, mild MR;  b. 10/2014 Echo: EF 30-35%, diff HK, gr1 DD, mild AI, mild to mod MR/TR, mod dil LA, nl RV, mod to sev PAH;  c. 04/2016 Echo: EF 20-25%, sev dil LV, diff HK, gr3 DD, mod AI, MV syst bowing w/ prolapse, mod dil RV, mod TR.  [name] patient ([place])    Mon, Wed, Fri  . Dyspnea   . ESRD (end stage renal disease) ([place])    a. MWF dialysis @ [facility].  . Hepatitis    Patient is unsure type of Hepatitis he has  . Hypertension   . Hypertensive heart disease with CHF (congestive heart failure) ([place])   . Peripheral vascular disease ([place])   . Pulmonary hypertension ([place]) 04/2016  . Renal insufficiency       Most Recent Cardiac Studies: Cardiac cath 06/2016: FINDINGS: Native Vessel Coronary Angiography: - Dominance: Right  - Left Main:Very large  LAD: - Ostial LAD: Large caliber - Proximal LAD: Large caliber with irregularity up to 10% - Mid LAD: Moderate to large caliber with 30-40% stenosis - Distal LAD: Small to moderate caliber  with diffuse irregularity up to  40-50% - Diag 1: Small caliber vessel with ostial 90% stenosis - Diag 2: Small to moderate caliber branching vessel with irregularity up  to 40% - Diag 3: Small caliber moderate distribution with diffuse irregularity  proximally up to 60-70% - Diag 4: Small to moderate caliber moderate distribution vessel with  irregularity up to 30% - Diag 5: Small caliber vessel  LCX: - Ostial LCx: Moderate to large caliber - Proximal LCx: Moderate to large caliber with irregularity up to  10% - Mid LCx: Moderate caliber with irregularity up to 30% - Distal LCx: Small to moderate caliber with irregularity to 20% - OM1: Moderate caliber moderate distribution with irregularity up to 30% - OM2: Very small caliber - OM3: Moderate caliber moderate distribution vessel with irregularity up  to 40%  RCA: - Ostial RCA: Moderate caliber - Proximal RCA: Functionally subtotaled with bridging collaterals poorly  opacifying the mid vessel - Mid RCA: Small to moderate caliber primarily being filled retrograde  from collaterals from the left system - Distal RCA: Moderate caliber poorly visualized filling primarily via  collaterals from left system - Right PDA: Poorly visualized fills via collaterals - RCA continuation: Poorly visualized fills via collaterals - PL branch 1: Poorly visualized fills by collaterals  Left Ventriculogram: - Not performed  Hemodynamics: - Aortic pressure: 111/70  - Mean: 85 - LVEDP (pre-A wave): 15 - LVEDP (post-A wave): 25-30   CONCLUSIONS: - Subtotaled proximal RCA with left to right collaterals - Diagonal branches with diffuse disease - Left anterior descending and circumflex systems with mild to moderate  diffuse disease up to 30-40% - Elevated left ventricular end-diastolic pressure of 30 post A wave   INDICATION:61 year old and on dialysis with new drop in LV systolic  function for further evaluation  PLAN: Optimize medical therapy   Echo 06/2016: Ultrasound enhancing agent utilized to improve endocardial border  definition  Dilated left ventricle - mild  Left ventricular hypertrophy - mild  Severely decreased left ventricular systolic function, ejection fraction  30 to 98%  Diastolic dysfunction - grade II (elevated filling pressures)  Degenerative mitral valve disease  Mitral annular calcification  Mitral regurgitation - mild to moderate  Dilated left atrium - moderate  Aortic sclerosis  Aortic regurgitation -  mild  Dilated right ventricle  Mildly decreased right ventricular systolic function  Tricuspid regurgitation - mild  Elevated pulmonary artery systolic pressure - severe  Elevated right atrial pressure   Surgical History:  Past Surgical History:  Procedure Laterality Date  . A/V SHUNT INTERVENTION N/A 04/06/2016   Procedure: A/V Shunt Intervention;  Surgeon: Katha Cabal, MD;  Location: [place] [facility];  Service: Cardiovascular;  Laterality: N/A;  . A/V SHUNT INTERVENTION N/A 06/29/2016   Procedure: A/V Shunt Intervention;  Surgeon: Katha Cabal, MD;  Location: [place] [facility];  Service: Cardiovascular;  Laterality: N/A;  . A/V SHUNTOGRAM Left 04/06/2016   Procedure: A/V Fistulagram;  Surgeon: Katha Cabal, MD;  Location: [place] [facility];  Service: Cardiovascular;  Laterality: Left;  . AV FISTULA PLACEMENT Left 2014  . DIALYSIS/PERMA CATHETER INSERTION Right    Dr. Delana Meyer  . PERIPHERAL VASCULAR CATHETERIZATION N/A 10/03/2014   Procedure: A/V Shuntogram/Fistulagram;  Surgeon: Algernon Huxley, MD;  Location: [place] [facility];  Service: Cardiovascular;  Laterality: N/A;  . PERIPHERAL VASCULAR CATHETERIZATION Left 10/03/2014   Procedure: A/V Shunt Intervention;  Surgeon: Algernon Huxley, MD;  Location: [place] [facility];  Service:  Cardiovascular;  Laterality: Left;  . PERIPHERAL VASCULAR CATHETERIZATION Left 05/01/2015   Procedure: A/V Shuntogram/Fistulagram;  Surgeon: Algernon Huxley, MD;  Location: [place] [facility];  Service: Cardiovascular;  Laterality: Left;  . PERIPHERAL VASCULAR CATHETERIZATION N/A 05/01/2015   Procedure: A/V Shunt Intervention;  Surgeon: Algernon Huxley, MD;  Location: [place] [facility];  Service: Cardiovascular;  Laterality: N/A;  . UPPER EXTREMITY ANGIOGRAPHY Bilateral 06/29/2016   Procedure: Upper Extremity Angiography;  Surgeon: Katha Cabal, MD;  Location: [place] [facility];  Service: Cardiovascular;  Laterality: Bilateral;  .  UPPER EXTREMITY INTERVENTION  06/29/2016   Procedure: Upper Extremity Intervention;  Surgeon: Katha Cabal, MD;  Location: [place] [facility];  Service: Cardiovascular;;     Home Meds: Prior to Admission medications   Medication Sig Start Date End Date Taking? Authorizing Provider  albuterol (PROVENTIL HFA;VENTOLIN HFA) 108 (90 Base) MCG/ACT inhaler Inhale into the lungs every 4 (four) hours as needed for wheezing or shortness of breath.   Yes [provider]  calcium acetate (PHOSLO) 667 MG capsule Take 1,334 mg by mouth 3 (three) times daily with meals. Take 2001 mgs with meals 3 times daily and 1334 with snacks 12/11/15  Yes [provider]  cinacalcet (SENSIPAR) 90 MG tablet Take 90 mg by mouth daily.   Yes [provider]  hydrALAZINE (APRESOLINE) 50 MG tablet Take 100 mg by mouth 3 (three) times daily.  07/01/16  Yes [provider]  ipratropium (ATROVENT HFA) 17 MCG/ACT inhaler Inhale 2 puffs into the lungs every 4 (four) hours as needed for wheezing.   Yes [provider]  isosorbide mononitrate (IMDUR) 30 MG 24 hr tablet Take 1 tablet (30 mg total) by mouth daily. 08/24/16 11/22/16 Yes Dustin Flock, MD  lidocaine-prilocaine (EMLA) cream Apply 1 application topically as needed (dialysis access).   Yes [provider]  nitroGLYCERIN (NITROSTAT) 0.4 MG SL tablet Place 0.4 mg under the tongue every 5 (five) minutes as needed for chest pain.   Yes [provider]  torsemide (DEMADEX) 20 MG tablet Take 1 tablet (20 mg total) by mouth daily. 09/07/16 10/07/16 Yes Alisa Graff, FNP    Inpatient Medications:  . calcium acetate  1,334-2,001 mg Oral TID WC  . carvedilol  6.25 mg Oral BID WC  . cinacalcet  90 mg Oral Daily  . heparin  5,000 Units Subcutaneous Q8H  . hydrALAZINE  100 mg Oral TID  . isosorbide mononitrate  30 mg Oral Daily  . losartan  25 mg Oral Daily  . sodium chloride flush  3 mL Intravenous Q12H  .  torsemide  20 mg Oral Daily   . calcium gluconate 1 g (09/20/16 0819)    Allergies:  Allergies  Allergen Reactions  . Sulfa Antibiotics Rash    Social History   Social History  . Marital status: Widowed    Spouse name: N/A  . Number of children: N/A  . Years of education: N/A   Occupational History  . Not on file.   Social History Main Topics  . Smoking status: Current Some Day Smoker    Packs/day: 0.25    Years: 40.00    Types: Cigarettes  . Smokeless tobacco: Never Used  . Alcohol use No  . Drug use: Yes    Frequency: 1.0 time per week    Types: Cocaine     Comment: 07/30/16 pt states last use 3-4 weeks ago  . Sexual activity: Not Currently  Other Topics Concern  . Not on file   Social History Narrative   Lives at home in [place] by himself.   Independent on ambulation but does not routinely exercise.     Family History  Problem Relation Age of Onset  . Hypertension Son   . Diabetes Son   . Hypertension Mother   . Diabetes Mother   . Diabetes Sister      Review of Systems: Review of Systems  Constitutional: Positive for malaise/fatigue. Negative for chills, diaphoresis, fever and weight loss.  HENT: Negative for congestion.   Eyes: Negative for discharge and redness.  Respiratory: Positive for shortness of breath. Negative for cough, hemoptysis, sputum production and wheezing.   Cardiovascular: Positive for orthopnea and leg swelling. Negative for chest pain, palpitations, claudication and PND.  Gastrointestinal: Negative for abdominal pain, blood in stool, heartburn, melena, nausea and vomiting.  Genitourinary: Negative for hematuria.  Musculoskeletal: Negative for falls and myalgias.  Skin: Negative for rash.  Neurological: Positive for weakness. Negative for dizziness, tingling, tremors, sensory change, speech change, focal weakness and loss of consciousness.  Endo/Heme/Allergies: Does not bruise/bleed easily.  Psychiatric/Behavioral: Negative  for substance abuse. The patient is not nervous/anxious.   All other systems reviewed and are negative.   Labs:  Recent Labs  09/19/16 1856 09/19/16 2215  TROPONINI 0.18* 0.19*   Lab Results  Component Value Date   WBC 6.9 09/20/2016   HGB 11.0 (L) 09/20/2016   HCT 33.7 (L) 09/20/2016   MCV 93.5 09/20/2016   PLT 198 09/20/2016    Recent Labs Lab 09/20/16 0453  NA 141  K 6.9*  CL 105  CO2 21*  BUN 83*  CREATININE 10.22*  CALCIUM 9.4  GLUCOSE 140*   Lab Results  Component Value Date   CHOL 101 11/07/2012   HDL 39 (L) 11/07/2012   LDLCALC 38 11/07/2012   TRIG 119 11/07/2012   No results found for: DDIMER  Radiology/Studies:  Dg Chest 2 View  Result Date: 09/19/2016 IMPRESSION: Markedly enlarged cardiac silhouette, stable. Pulmonary vascular congestion. Electronically Signed   By: Fidela Salisbury M.D.   On: 09/19/2016 20:20   Dg Chest Portable 1 View  Result Date: 08/22/2016 IMPRESSION: Cardiomegaly, vascular congestion. Electronically Signed   By: Rolm Baptise M.D.   On: 08/22/2016 12:31    EKG: Interpreted by me showed: NSR, 84 bpm, nonspecific inferior st/t changes, poor R wave progression Telemetry: Interpreted by me showed: NSR  Weights: Filed Weights   09/19/16 1851 09/20/16 0331  Weight: 170 lb (77.1 kg) 166 lb 4.8 oz (75.4 kg)     Physical Exam: Blood pressure (!) 141/89, pulse 70, temperature 98 F (36.7 C), resp. rate (!) 22, height 5\' 9"  (1.753 m), weight 166 lb 4.8 oz (75.4 kg), SpO2 91 %. Body mass index is 24.56 kg/m. General: Well developed, well nourished, in no acute distress. Head: Normocephalic, atraumatic, sclera non-icteric, no xanthomas, nares are without discharge.  Neck: Negative for carotid bruits. JVD not elevated. Lungs: Diminished breath sounds bilaterally. Breathing is unlabored. Heart: RRR with S1 S2. II/VI systolic murmur, no rubs, or gallops appreciated. Abdomen: Soft, non-tender, non-distended with normoactive bowel  sounds. No hepatomegaly. No rebound/guarding. No obvious abdominal masses. Msk:  Strength and tone appear normal for age. Extremities: No clubbing or cyanosis. Trace bilateral pre-tibial edema. Distal pedal pulses are 2+ and equal bilaterally. Neuro: Alert and oriented X 3. No facial asymmetry. No focal deficit. Moves all extremities spontaneously. Psych:  Responds to questions appropriately  with a normal affect.    Assessment and Plan:  Principal Problem:   Acute on chronic systolic CHF (congestive heart failure) (HCC) Active Problems:   ESRD (end stage renal disease) on dialysis (HCC)   HTN (hypertension)   Chest pain    1. Acute respiratory failure with hypoxia: -Previously required BiPAP -On nasal cannula -Wean as able per IM -Likely in the setting of volume overload with acute on chronic combined CHF on HD  2. Pulmonary edema/hypertensive heart disease: -Volume managed by HD (MWF) -For HD today -Recent LHC 06/2016 as above -Continue torsemide as he does make small amount of urine -Coreg. Imdur/hydralazine -Losartan on hold given hyperkalemia -Recent echo 06/2016 as above, no indication to repeat -Unlikely to be an ICD candidate given ESRD on HD  3. Elevated troponin: -Minimally elevated with known chronic troponinemia 2/2 ESRD -Recent LHC as above -No chest pain -No plans for inpatient ischemic evaluation  4. ESRD: -HD per renal  5. Hyperkalemia: -Calcium gluconate -HD today -Per renal  6. Anemia of chronic disease: -HGB stable   Signed, Christell Faith, PA-C [facility] Pager: [phone] 09/20/2016, 8:59 AM

## 2016-09-21 LAB — BASIC METABOLIC PANEL
Anion gap: 12 (ref 5–15)
BUN: 50 mg/dL — AB (ref 6–20)
CHLORIDE: 100 mmol/L — AB (ref 101–111)
CO2: 27 mmol/L (ref 22–32)
Calcium: 8.2 mg/dL — ABNORMAL LOW (ref 8.9–10.3)
Creatinine, Ser: 6.97 mg/dL — ABNORMAL HIGH (ref 0.61–1.24)
GFR calc non Af Amer: 8 mL/min — ABNORMAL LOW (ref >60)
GFR, EST AFRICAN AMERICAN: 9 mL/min — AB (ref >60)
Glucose, Bld: 65 mg/dL (ref 65–99)
POTASSIUM: 4.7 mmol/L (ref 3.5–5.1)
SODIUM: 139 mmol/L (ref 135–145)

## 2016-09-21 LAB — CBC
HEMATOCRIT: 27.8 % — AB (ref 40.0–52.0)
Hemoglobin: 9.3 g/dL — ABNORMAL LOW (ref 13.0–18.0)
MCH: 31.2 pg (ref 26.0–34.0)
MCHC: 33.6 g/dL (ref 32.0–36.0)
MCV: 92.7 fL (ref 80.0–100.0)
Platelets: 194 10*3/uL (ref 150–440)
RBC: 2.99 MIL/uL — AB (ref 4.40–5.90)
RDW: 17.2 % — ABNORMAL HIGH (ref 11.5–14.5)
WBC: 4.7 10*3/uL (ref 3.8–10.6)

## 2016-09-21 LAB — HEPATITIS B SURFACE ANTIGEN: HEP B S AG: NEGATIVE

## 2016-09-21 MED ORDER — OXYCODONE HCL 5 MG PO TABS: 5.0000 mg | ORAL_TABLET | Freq: Two times a day (BID) | ORAL | Status: DC | PRN

## 2016-09-21 MED ORDER — CARVEDILOL 6.25 MG PO TABS: 6.2500 mg | ORAL_TABLET | Freq: Two times a day (BID) | ORAL | 0 refills | Status: DC

## 2016-09-21 MED ORDER — OXYCODONE HCL 5 MG PO TABS: 5.0000 mg | ORAL_TABLET | Freq: Two times a day (BID) | ORAL | 0 refills | Status: DC | PRN

## 2016-09-21 MED ORDER — ACETAMINOPHEN 325 MG PO TABS: 650.0000 mg | ORAL_TABLET | Freq: Four times a day (QID) | ORAL | Status: DC | PRN

## 2016-09-21 MED ORDER — LOSARTAN POTASSIUM 25 MG PO TABS: 25.0000 mg | ORAL_TABLET | Freq: Every day | ORAL | 0 refills | Status: DC

## 2016-09-21 NOTE — Progress Notes (Signed)
SATURATION QUALIFICATIONS: (This note is used to comply with regulatory documentation for home oxygen)  Patient Saturations on Room Air at Rest = 85%  Patient Saturations on Room Air while Ambulating = n/a%  Patient Saturations on 2 Liters of oxygen while Ambulating = 95%  Please briefly explain why patient needs home oxygen:

## 2016-09-21 NOTE — Discharge Instructions (Signed)
Heart Failure Clinic appointment on October 05 2016 at 8:40am with Darylene Price, [place]. Please call [phone] to reschedule.  Continue hemodialysis on Monday, Wednesday and Friday Continue home health and 2 L of oxygen via nasal cannula

## 2016-09-21 NOTE — Progress Notes (Signed)
Pt discharge home. IV discontinued without incident. Reviewed home med list with patient and discussed follow-up appts. Pt verbalized understanding with no questions for RN. Home medications returned and witnessed by [employer]. I will continue to assess.  with patient.

## 2016-09-21 NOTE — Care Management Important Message (Signed)
Important Message  Patient Details  Name: [name] MRN: [id number] Date of Birth: 09/29/1955   Medicare Important Message Given:  N/A - LOS <3 / Initial given by admissions    Katrina Stack, RN 09/21/2016, 1:40 PM

## 2016-09-21 NOTE — Discharge Summary (Addendum)
[place] [facility] [place] NAME: [name]    MR#:  [id number]  DATE OF BIRTH:  [birth year]  DATE OF ADMISSION:  09/19/2016 ADMITTING PHYSICIAN: Lance Coon, MD  DATE OF DISCHARGE:  09/21/2016 PRIMARY CARE PHYSICIAN: [facility], [place]    ADMISSION DIAGNOSIS:  Shortness of breath [R06.02] Hypoxia [R09.02]  DISCHARGE DIAGNOSIS:  Hyperkalemia End-stage renal disease Acute hypoxic respiratory failure needing 2 L of oxygen via nasal cannula  SECONDARY DIAGNOSIS:   Past Medical History:  Diagnosis Date  . Arthritis   . Chest pain    a. 09/2013 Myoview [facility]): basal inf defect more pronounced @ rest, likely artifact, EF 48%, low risk study.  . Chronic combined systolic and diastolic CHF (congestive heart failure) ([place])    a. 10/2013 Echo [facility]): EF 50%, diast dysfxn, triv AI/TR, mild MR;  b. 10/2014 Echo: EF 30-35%, diff HK, gr1 DD, mild AI, mild to mod MR/TR, mod dil LA, nl RV, mod to sev PAH;  c. 04/2016 Echo: EF 20-25%, sev dil LV, diff HK, gr3 DD, mod AI, MV syst bowing w/ prolapse, mod dil RV, mod TR.  [name] D[facility] patient ([place])    Mon, Wed, Fri  . Dyspnea   . ESRD (end stage renal disease) ([place])    a. MWF dialysis @ [facility].  . Hepatitis    Patient is unsure type of Hepatitis he has  . Hypertension   . Hypertensive heart disease with CHF (congestive heart failure) ([place])   . Peripheral vascular disease ([place])   . Pulmonary hypertension ([place]) 04/2016  . Renal insufficiency     HOSPITAL COURSE:  Brief history and physical : [name]  is a 61 y.o. male who presents with Significant shortness of breath. Patient states this is been building for the last couple of days. He also has significant lower extremity swelling. He is an end-stage renal patient on dialysis. He also has heart failure. Workup here in the ED tonight indicates exacerbation of his heart failure with pulmonary vascular congestion on  imaging. Hospitalists were called for admission. Of note, he has an elevated troponin tonight, but he has he has a chronically elevated troponin to the same level. Please review H&P for complete details   hospital course  1. Acute hypoxic respiratory failure in the setting of acute on chronic combined systolic and diastolic heart failure Patient went BiPAP when necessary Wean off nasal cannula and BiPAP  2. Acute on chronic combined systolic and diastolic heart failure ejection fraction 20-25%: Continue Coreg and torsemide. Critical condition improved with hemodialysis on Monday  Continue to monitor daily weights and intake and output Patient will need referral CHF clinic at discharge  3. Hyperkalemia: Patient given insulin and dextrose morning. Patient had hemodialysis and hyperkalemia resolved Resume home dose losartan at nighttime   4. ESRD: Continue outpatient hemodialysis on Monday, Wednesday and Friday and outpatient nephrology follow-up  5. Elevated troponin: Patient is chronically elevated troponin due to poor renal clearance Continue to monitor Recent echocardiogram in March 2018 has revealed 20-25% of ejection fraction   6. Essential hypertension: Continue Coreg, losartan, isosorbide and hydralazine  7. Rib pain -we will prescribe only 5 tablets of 5 mg of oxycodone with no refills as patient has drug-seeking behavior in the past. Patient is agreeable   Management plans discussed with the patient and he is in agreement.  CODE STATUS: full  CC: Primary care physician; [facility], [place]  DISCHARGE CONDITIONS:  Stable  CONSULTS OBTAINED:  Treatment Team:  Murlean Iba, MD Minna Merritts, MD   PROCEDURES Hemodialysis Monday Wednesday Friday  DRUG ALLERGIES:   Allergies  Allergen Reactions  . Sulfa Antibiotics Rash    DISCHARGE MEDICATIONS:   Current Discharge Medication List    START taking these medications   Details   acetaminophen (TYLENOL) 325 MG tablet Take 2 tablets (650 mg total) by mouth every 6 (six) hours as needed for mild pain (or Fever >/= 101).    oxyCODONE (OXY IR/ROXICODONE) 5 MG immediate release tablet Take 1 tablet (5 mg total) by mouth every 12 (twelve) hours as needed for breakthrough pain. Qty: 5 tablet, Refills: 0      CONTINUE these medications which have CHANGED   Details  carvedilol (COREG) 6.25 MG tablet Take 1 tablet (6.25 mg total) by mouth 2 (two) times daily with a meal. Qty: 60 tablet, Refills: 0    losartan (COZAAR) 25 MG tablet Take 1 tablet (25 mg total) by mouth at bedtime. Qty: 30 tablet, Refills: 0      CONTINUE these medications which have NOT CHANGED   Details  albuterol (PROVENTIL HFA;VENTOLIN HFA) 108 (90 Base) MCG/ACT inhaler Inhale into the lungs every 4 (four) hours as needed for wheezing or shortness of breath.    calcium acetate (PHOSLO) 667 MG capsule Take 1,334 mg by mouth 3 (three) times daily with meals. Take 2001 mgs with meals 3 times daily and 1334 with snacks    cinacalcet (SENSIPAR) 90 MG tablet Take 90 mg by mouth daily.    hydrALAZINE (APRESOLINE) 50 MG tablet Take 100 mg by mouth 3 (three) times daily.     ipratropium (ATROVENT HFA) 17 MCG/ACT inhaler Inhale 2 puffs into the lungs every 4 (four) hours as needed for wheezing.    isosorbide mononitrate (IMDUR) 30 MG 24 hr tablet Take 1 tablet (30 mg total) by mouth daily. Qty: 30 tablet, Refills: 2    lidocaine-prilocaine (EMLA) cream Apply 1 application topically as needed (dialysis access).    nitroGLYCERIN (NITROSTAT) 0.4 MG SL tablet Place 0.4 mg under the tongue every 5 (five) minutes as needed for chest pain.    torsemide (DEMADEX) 20 MG tablet Take 1 tablet (20 mg total) by mouth daily. Qty: 30 tablet, Refills: 3         DISCHARGE INSTRUCTIONS:   Heart Failure Clinic appointment on October 05 2016 at 8:40am with Darylene Price, [place]. Please call [phone] to reschedule.   Continue hemodialysis on Monday, Wednesday and Friday Continue home health and 2 L of oxygen via nasal cannula Is in  DIET:  Cardiac diet;1200 cc fluid restriction,  DISCHARGE CONDITION: Stable  Stable  ACTIVITY:  Activity as tolerated  OXYGEN:  Home Oxygen: Yes.     Oxygen Delivery: 2 liters/min via Patient connected to nasal cannula oxygen  DISCHARGE LOCATION:  home   If you experience worsening of your admission symptoms, develop shortness of breath, life threatening emergency, suicidal or homicidal thoughts you must seek medical attention immediately by calling 911 or calling your MD immediately  if symptoms less severe.  You Must read complete instructions/literature along with all the possible adverse reactions/side effects for all the Medicines you take and that have been prescribed to you. Take any new Medicines after you have completely understood and accpet all the possible adverse reactions/side effects.   Please note  You were cared for by a hospitalist during your hospital stay. If you have any  questions about your discharge medications or the care you received while you were in the hospital after you are discharged, you can call the unit and asked to speak with the hospitalist on call if the hospitalist that took care of you is not available. Once you are discharged, your primary care physician will handle any further medical issues. Please note that NO REFILLS for any discharge medications will be authorized once you are discharged, as it is imperative that you return to your primary care physician (or establish a relationship with a primary care physician if you do not have one) for your aftercare needs so that they can reassess your need for medications and monitor your lab values.     Today  Chief Complaint  Patient presents with  . Shortness of Breath  . Chest Pain   Patient is feeling much better after hemodialysis yesterday. Denies any complaints. Wants to  go home. Patient desaturated to 85% on room air requiring 2 L of function via nasal cannula. Counseled patient to quit smoking  ROS:  CONSTITUTIONAL: Denies fevers, chills. Denies any fatigue, weakness.  EYES: Denies blurry vision, double vision, eye pain. EARS, NOSE, THROAT: Denies tinnitus, ear pain, hearing loss. RESPIRATORY: Denies cough, wheeze, shortness of breath.  CARDIOVASCULAR: Denies chest pain, palpitations, edema.  GASTROINTESTINAL: Denies nausea, vomiting, diarrhea, abdominal pain. Denies bright red blood per rectum. GENITOURINARY: Denies dysuria, hematuria. ENDOCRINE: Denies nocturia or thyroid problems. HEMATOLOGIC AND LYMPHATIC: Denies easy bruising or bleeding. SKIN: Denies rash or lesion. MUSCULOSKELETAL: Denies pain in neck, back, shoulder, knees, hips or arthritic symptoms.  NEUROLOGIC: Denies paralysis, paresthesias.  PSYCHIATRIC: Denies anxiety or depressive symptoms.   VITAL SIGNS:  Blood pressure 107/76, pulse 69, temperature 98.3 F (36.8 C), resp. rate 18, height 5\' 9"  (1.753 m), weight 74.2 kg (163 lb 8 oz), SpO2 96 %.  I/O:    Intake/Output Summary (Last 24 hours) at 09/21/16 1221 Last data filed at 09/21/16 0904  Gross per 24 hour  Intake              480 ml  Output             1600 ml  Net            -1120 ml    PHYSICAL EXAMINATION:  GENERAL:  61 y.o.-year-old patient lying in the bed with no acute distress.  EYES: Pupils equal, round, reactive to light and accommodation. No scleral icterus. Extraocular muscles intact.  HEENT: Head atraumatic, normocephalic. Oropharynx and nasopharynx clear.  NECK:  Supple, no jugular venous distention. No thyroid enlargement, no tenderness.  LUNGS: Normal breath sounds bilaterally, no wheezing, rales,rhonchi or crepitation. No use of accessory muscles of respiration.  CARDIOVASCULAR: S1, S2 normal. No murmurs, rubs, or gallops.  ABDOMEN: Soft, non-tender, non-distended. Bowel sounds present. No organomegaly or  mass.  EXTREMITIES: No pedal edema, cyanosis, or clubbing.  NEUROLOGIC: Cranial nerves II through XII are intact. Muscle strength 5/5 in all extremities. Sensation intact. Gait not checked.  PSYCHIATRIC: The patient is alert and oriented x 3.  SKIN: No obvious rash, lesion, or ulcer.   DATA REVIEW:   CBC  Recent Labs Lab 09/21/16 0454  WBC 4.7  HGB 9.3*  HCT 27.8*  PLT 194    Chemistries   Recent Labs Lab 09/21/16 0454  NA 139  K 4.7  CL 100*  CO2 27  GLUCOSE 65  BUN 50*  CREATININE 6.97*  CALCIUM 8.2*    Cardiac Enzymes  Recent Labs  Lab 09/19/16 2215  TROPONINI 0.19*    Microbiology Results  Results for orders placed or performed during the hospital encounter of 09/19/16  MRSA PCR Screening     Status: None   Collection Time: 09/20/16  3:45 AM  Result Value Ref Range Status   MRSA by PCR NEGATIVE NEGATIVE Final    Comment:        The GeneXpert MRSA Assay (FDA approved for NASAL specimens only), is one component of a comprehensive MRSA colonization surveillance program. It is not intended to diagnose MRSA infection nor to guide or monitor treatment for MRSA infections.     RADIOLOGY:  Dg Chest 2 View  Result Date: 09/19/2016 CLINICAL DATA:  Chest pain.  Shortness of breath. EXAM: CHEST  2 VIEW COMPARISON:  08/22/2016 FINDINGS: Large bore right-sided central venous catheter is stable. Enlarged cardiac silhouette. Calcific atherosclerotic disease of the aorta. There is no evidence of focal airspace consolidation, pleural effusion or pneumothorax. Mild pulmonary vascular congestion. Osseous structures are without acute abnormality. Soft tissues are grossly normal. IMPRESSION: Markedly enlarged cardiac silhouette, stable. Pulmonary vascular congestion. Electronically Signed   By: Fidela Salisbury M.D.   On: 09/19/2016 20:20    EKG:   Orders placed or performed during the hospital encounter of 09/19/16  . ED EKG within 10 minutes  . ED EKG within  10 minutes      Management plans discussed with the patient, family and they are in agreement.  CODE STATUS:     Code Status Orders        Start     Ordered   09/20/16 0319  Full code  Continuous     09/20/16 0318    Code Status History    Date Active Date Inactive Code Status Order ID Comments User Context   08/22/2016  4:58 PM 08/24/2016  4:07 PM Full Code [id number]  Demetrios Loll, MD Inpatient   07/04/2016  7:29 PM 07/05/2016  5:28 PM Full Code [id number]  Idelle Crouch, MD Inpatient   06/07/2016  1:02 AM 06/08/2016  3:14 PM Full Code [id number]  Hugelmeyer, [place], DO Inpatient   04/30/2016 12:07 AM 05/01/2016  6:21 PM Full Code [id number]  Lance Coon, MD Inpatient   02/26/2016  9:07 PM 02/28/2016  6:04 PM Full Code [id number]  Lance Coon, MD Inpatient   01/07/2016  1:32 AM 01/07/2016  5:24 PM Full Code [id number]  Hugelmeyer, [place], DO ED   08/02/2015 11:16 PM 08/04/2015 11:11 PM Full Code [id number]  Quintella Baton, MD Inpatient   12/02/2014  8:52 PM 12/04/2014  5:53 PM Full Code [id number]  Vaughan Basta, MD Inpatient   11/10/2014 11:23 PM 11/12/2014  3:06 PM Full Code [id number]  Gladstone Lighter, MD Inpatient    Advance Directive Documentation     Most Recent Value  Type of Advance Directive  Healthcare Power of Attorney  Pre-existing out of facility DNR order (yellow form or pink MOST form)  -  "MOST" Form in Place?  -      TOTAL TIME TAKING CARE OF THIS PATIENT: 45 minutes.   Note: This dictation was prepared with Dragon dictation along with smaller phrase technology. Any transcriptional errors that result from this process are unintentional.   @[facility] @  on 09/21/2016 at 12:21 PM  Between 7am to 6pm - Pager - [phone]  After 6pm go to [email or web address] - password [email or web address] [facility]  Office  [phone]  CC: Primary care physician; [facility], [place]

## 2016-09-21 NOTE — Care Management (Addendum)
Patient for discharge home today and will require home oxygen.  Hypoxia did not resolve with dialysis and diuretics.  Is now agreeable to have home health nurse.  No agency preference.  Will need nursing to reinforce oxygen education, daily weights and fluid restriction.  Referral called to [facility].  Requested in home assessment for portable concentrator.  Notified [name] with [facility] of discharge

## 2016-09-21 NOTE — Progress Notes (Signed)
[place], [place] 09/21/16  Subjective:   Patient feels well today. 1500 cc removed with HD  No SOB Edema has improved  Objective:  Vital signs in last 24 hours:  Temp:  [97.7 F (36.5 C)-98.3 F (36.8 C)] 98.3 F (36.8 C) (07/31 1112) Pulse Rate:  [40-79] 69 (07/31 1112) Resp:  [10-20] 18 (07/31 1112) BP: (107-155)/(68-99) 107/76 (07/31 1112) SpO2:  [90 %-100 %] 96 % (07/31 1112) Weight:  [74.2 kg (163 lb 8 oz)] 74.2 kg (163 lb 8 oz) (07/31 0517)  Weight change: -1.711 kg (-3 lb 12.4 oz) Filed Weights   09/20/16 0331 09/20/16 0920 09/21/16 0517  Weight: 75.4 kg (166 lb 4.8 oz) 75.4 kg (166 lb 3.6 oz) 74.2 kg (163 lb 8 oz)    Intake/Output:    Intake/Output Summary (Last 24 hours) at 09/21/16 1200 Last data filed at 09/21/16 0904  Gross per 24 hour  Intake              480 ml  Output             1600 ml  Net            -1120 ml     Physical Exam: General: No acute distress, laying in the bed  HEENT Anicteric, moist mucous membranes  Neck supple  Pulm/lungs Normal breathing effort, clear to auscultation  CVS/Heart Regular, no rub or gallop  Abdomen:  Soft, nontender, nondistended  Extremities: no edema  Neurologic: Alert and oriented  Skin: vitiligo  Access: Right IJ PermCath       Basic Metabolic Panel:   Recent Labs Lab 09/19/16 1856 09/20/16 0453 09/20/16 1030 09/21/16 0454  NA 139 141  --  139  K 5.5* 6.9* 3.7 4.7  CL 103 105  --  100*  CO2 21* 21*  --  27  GLUCOSE 85 140*  --  65  BUN 76* 83*  --  50*  CREATININE 9.19* 10.22*  --  6.97*  CALCIUM 9.4 9.4  --  8.2*     CBC:  Recent Labs Lab 09/19/16 1856 09/20/16 0453 09/21/16 0454  WBC 7.1 6.9 4.7  HGB 10.6* 11.0* 9.3*  HCT 32.3* 33.7* 27.8*  MCV 93.7 93.5 92.7  PLT 218 198 194      Lab Results  Component Value Date   HEPBSAG Negative 09/20/2016   HEPBSAB Reactive 06/07/2016      Microbiology:  Recent Results (from the past 240 hour(s))   MRSA PCR Screening     Status: None   Collection Time: 09/20/16  3:45 AM  Result Value Ref Range Status   MRSA by PCR NEGATIVE NEGATIVE Final    Comment:        The GeneXpert MRSA Assay (FDA approved for NASAL specimens only), is one component of a comprehensive MRSA colonization surveillance program. It is not intended to diagnose MRSA infection nor to guide or monitor treatment for MRSA infections.     Coagulation Studies: No results for input(s): LABPROT, INR in the last 72 hours.  Urinalysis: No results for input(s): COLORURINE, LABSPEC, PHURINE, GLUCOSEU, HGBUR, BILIRUBINUR, KETONESUR, PROTEINUR, UROBILINOGEN, NITRITE, LEUKOCYTESUR in the last 72 hours.  Invalid input(s): APPERANCEUR    Imaging: Dg Chest 2 View  Result Date: 09/19/2016 CLINICAL DATA:  Chest pain.  Shortness of breath. EXAM: CHEST  2 VIEW COMPARISON:  08/22/2016 FINDINGS: Large bore right-sided central venous catheter is stable. Enlarged cardiac silhouette. Calcific atherosclerotic disease of the aorta. There is  no evidence of focal airspace consolidation, pleural effusion or pneumothorax. Mild pulmonary vascular congestion. Osseous structures are without acute abnormality. Soft tissues are grossly normal. IMPRESSION: Markedly enlarged cardiac silhouette, stable. Pulmonary vascular congestion. Electronically Signed   By: Fidela Salisbury M.D.   On: 09/19/2016 20:20     Medications:    . calcium acetate  1,334-2,001 mg Oral TID WC  . carvedilol  6.25 mg Oral BID WC  . cinacalcet  90 mg Oral Daily  . heparin  5,000 Units Subcutaneous Q8H  . hydrALAZINE  100 mg Oral TID  . isosorbide mononitrate  30 mg Oral Daily  . losartan  25 mg Oral Daily  . sodium chloride flush  3 mL Intravenous Q12H  . torsemide  20 mg Oral Daily   acetaminophen **OR** acetaminophen, HYDROcodone-acetaminophen, ipratropium-albuterol, labetalol, ondansetron **OR** ondansetron (ZOFRAN) IV  Assessment/ Plan:  61 y.o.african  American male  With End stage renal disease on hemodialysis, hypertension, hepatitis C, anemia, seizure disorder, COPD/tobacco abuse, secondary hyperparathyroidism  [facility] MWF [facility] [address]   1.  End-stage renal disease 2.  Hyperkalemia, severe 3.  Anemia of chronic kidney disease 4.  Secondary hyperparathyroidism    Plan: Continue scheduled dialysis as outpatient Patient expected to d/c home later today      LOS: 2 [facility] 7/31/201812:00 PM  [place], [place]

## 2016-09-28 DIAGNOSIS — I132 Hypertensive heart and chronic kidney disease with heart failure and with stage 5 chronic kidney disease, or end stage renal disease: Secondary | ICD-10-CM | POA: Diagnosis not present

## 2016-09-29 LAB — COCAINE,MS,WB/SP RFX
Benzoylecgonine: 122 ng/mL
COCAINE CONFIRMATION: POSITIVE
COCAINE: NEGATIVE ng/mL

## 2016-09-29 LAB — DRUG SCREEN 10 W/CONF, SERUM
Amphetamines, IA: NEGATIVE ng/mL
Barbiturates, IA: NEGATIVE ug/mL
Benzodiazepines, IA: NEGATIVE ng/mL
Cocaine & Metabolite, IA: POSITIVE ng/mL
Methadone, IA: NEGATIVE ng/mL
Opiates, IA: NEGATIVE ng/mL
Oxycodones, IA: NEGATIVE ng/mL
Phencyclidine, IA: NEGATIVE ng/mL
Propoxyphene, IA: NEGATIVE ng/mL
THC(Marijuana) Metabolite, IA: NEGATIVE ng/mL

## 2016-09-29 LAB — OXYCODONES,MS,WB/SP RFX
OXYCOCONE: NEGATIVE ng/mL
Oxycodones Confirmation: NEGATIVE
Oxymorphone: NEGATIVE ng/mL

## 2016-09-30 ENCOUNTER — Ambulatory Visit: Payer: Medicare Other | Admitting: Internal Medicine

## 2016-10-05 ENCOUNTER — Encounter: Payer: Self-pay | Admitting: Family

## 2016-10-05 ENCOUNTER — Ambulatory Visit: Payer: Medicare Other | Attending: Family | Admitting: Family

## 2016-10-05 VITALS — BP 117/60 | HR 70 | Resp 18 | Ht 69.0 in | Wt 169.1 lb

## 2016-10-05 DIAGNOSIS — F17210 Nicotine dependence, cigarettes, uncomplicated: Secondary | ICD-10-CM | POA: Insufficient documentation

## 2016-10-05 DIAGNOSIS — Z79899 Other long term (current) drug therapy: Secondary | ICD-10-CM | POA: Insufficient documentation

## 2016-10-05 DIAGNOSIS — Z9889 Other specified postprocedural states: Secondary | ICD-10-CM | POA: Diagnosis not present

## 2016-10-05 DIAGNOSIS — I10 Essential (primary) hypertension: Secondary | ICD-10-CM

## 2016-10-05 DIAGNOSIS — Z833 Family history of diabetes mellitus: Secondary | ICD-10-CM | POA: Diagnosis not present

## 2016-10-05 DIAGNOSIS — I130 Hypertensive heart and chronic kidney disease with heart failure and stage 1 through stage 4 chronic kidney disease, or unspecified chronic kidney disease: Secondary | ICD-10-CM | POA: Diagnosis not present

## 2016-10-05 DIAGNOSIS — Z720 Tobacco use: Secondary | ICD-10-CM

## 2016-10-05 DIAGNOSIS — I509 Heart failure, unspecified: Secondary | ICD-10-CM | POA: Diagnosis not present

## 2016-10-05 DIAGNOSIS — Z992 Dependence on renal dialysis: Secondary | ICD-10-CM

## 2016-10-05 DIAGNOSIS — N186 End stage renal disease: Secondary | ICD-10-CM

## 2016-10-05 DIAGNOSIS — Z8249 Family history of ischemic heart disease and other diseases of the circulatory system: Secondary | ICD-10-CM | POA: Diagnosis not present

## 2016-10-05 DIAGNOSIS — I5022 Chronic systolic (congestive) heart failure: Secondary | ICD-10-CM

## 2016-10-05 MED ORDER — SACUBITRIL-VALSARTAN 24-26 MG PO TABS: 1.0000 | ORAL_TABLET | Freq: Two times a day (BID) | ORAL | 3 refills | Status: DC

## 2016-10-05 NOTE — Patient Instructions (Addendum)
Resume weighing daily and call for an overnight weight gain of > 2 pounds or a weekly weight gain of >5 pounds.  Stop taking losartan and begin taking entresto 24/26mg  as 1 tablet twice daily.

## 2016-10-05 NOTE — Progress Notes (Signed)
Patient ID: [name], male    DOB: 03-20-1955, 61 y.o.   MRN: [id number]  HPI  [name] is a 61 y/o male with a history of HTN, chronic kidney diease (dialysis dependent), PVD, pulmonary HTN, hepatitis, arthritis, current tobacco use and chronic heart failure.   Reviewed last echo report from 04/30/16 which showed an EF of 20-25% along with moderate TR and severe Thomas and moderate AR.   Admitted 09/19/16 with HF exacerbation. Chronically elevated troponin. Cardiology consult obtained. Dialysis continued and he was discharged home after 2 days. Admitted 08/22/16 with HF exacerbation. Given IV lasix without much output but after dialysis, symptoms improved. Discharged home after 2 days. Was in the ED 07/13/16 due to dyspnea and chest pain. CXR was negative. Chronic troponin elevation. Discharged home. Admitted 07/04/16 due to severe hyperkalemia. Received emergent dialysis with improvement in potassium level. Discharged home the next day. Admitted 06/06/16 due to HF exacerbation. Received IV lasix along with dialysis. Initially required bipap but was weaned off of that. Discharged after 2 days.   He presents today for a follow-up visit with a chief complaint of moderate shortness of breath with minimal exertion. He describes this as chronic in nature having been present for several years with varying levels of severity. He has associated fatigue and edema along with this.   Past Medical History:  Diagnosis Date  . Arthritis   . Chest pain    a. 09/2013 Myoview [facility]): basal inf defect more pronounced @ rest, likely artifact, EF 48%, low risk study.  . Chronic combined systolic and diastolic CHF (congestive heart failure) ([place])    a. 10/2013 Echo [facility]): EF 50%, diast dysfxn, triv AI/TR, mild Thomas;  b. 10/2014 Echo: EF 30-35%, diff HK, gr1 DD, mild AI, mild to mod Thomas/TR, mod dil LA, nl RV, mod to sev PAH;  c. 04/2016 Echo: EF 20-25%, sev dil LV, diff HK, gr3 DD, mod AI, MV syst bowing w/ prolapse, mod dil RV,  mod TR.  [name] Dialysis patient ([place])    Mon, Wed, Fri  . Dyspnea   . ESRD (end stage renal disease) ([place])    a. MWF dialysis @ [facility].  . Hepatitis    Patient is unsure type of Hepatitis he has  . Hypertension   . Hypertensive heart disease with CHF (congestive heart failure) ([place])   . Peripheral vascular disease ([place])   . Pulmonary hypertension ([place]) 04/2016  . Renal insufficiency    Past Surgical History:  Procedure Laterality Date  . A/V FISTULAGRAM Left 04/06/2016   Procedure: A/V Fistulagram;  Surgeon: Katha Cabal, MD;  Location: [place] [facility];  Service: Cardiovascular;  Laterality: Left;  . A/V SHUNT INTERVENTION N/A 04/06/2016   Procedure: A/V Shunt Intervention;  Surgeon: Katha Cabal, MD;  Location: [place] [facility];  Service: Cardiovascular;  Laterality: N/A;  . A/V SHUNT INTERVENTION N/A 06/29/2016   Procedure: A/V Shunt Intervention;  Surgeon: Katha Cabal, MD;  Location: [place] [facility];  Service: Cardiovascular;  Laterality: N/A;  . AV FISTULA PLACEMENT Left 2014  . DIALYSIS/PERMA CATHETER INSERTION Right    Dr. Delana Meyer  . PERIPHERAL VASCULAR CATHETERIZATION N/A 10/03/2014   Procedure: A/V Shuntogram/Fistulagram;  Surgeon: Algernon Huxley, MD;  Location: [place] [facility];  Service: Cardiovascular;  Laterality: N/A;  . PERIPHERAL VASCULAR CATHETERIZATION Left 10/03/2014   Procedure: A/V Shunt Intervention;  Surgeon: Algernon Huxley, MD;  Location: [place] [facility];  Service: Cardiovascular;  Laterality: Left;  . PERIPHERAL VASCULAR CATHETERIZATION Left 05/01/2015   Procedure: A/V Shuntogram/Fistulagram;  Surgeon: Algernon Huxley, MD;  Location: [place] [facility];  Service: Cardiovascular;  Laterality: Left;  . PERIPHERAL VASCULAR CATHETERIZATION N/A 05/01/2015   Procedure: A/V Shunt Intervention;  Surgeon: Algernon Huxley, MD;  Location: [place] [facility];  Service: Cardiovascular;  Laterality: N/A;  . UPPER EXTREMITY ANGIOGRAPHY Bilateral  06/29/2016   Procedure: Upper Extremity Angiography;  Surgeon: Katha Cabal, MD;  Location: [place] [facility];  Service: Cardiovascular;  Laterality: Bilateral;  . UPPER EXTREMITY INTERVENTION  06/29/2016   Procedure: Upper Extremity Intervention;  Surgeon: Katha Cabal, MD;  Location: [place] [facility];  Service: Cardiovascular;;   Family History  Problem Relation Age of Onset  . Hypertension Son   . Diabetes Son   . Hypertension Mother   . Diabetes Mother   . Diabetes Sister    Social History  Substance Use Topics  . Smoking status: Current Some Day Smoker    Packs/day: 0.25    Years: 40.00    Types: Cigarettes  . Smokeless tobacco: Never Used  . Alcohol use No   Allergies  Allergen Reactions  . Sulfa Antibiotics Rash   Prior to Admission medications   Medication Sig Start Date End Date Taking? Authorizing Provider  acetaminophen (TYLENOL) 325 MG tablet Take 2 tablets (650 mg total) by mouth every 6 (six) hours as needed for mild pain (or Fever >/= 101). 09/21/16  Yes Gouru, Aruna, MD  albuterol (PROVENTIL HFA;VENTOLIN HFA) 108 (90 Base) MCG/ACT inhaler Inhale into the lungs every 4 (four) hours as needed for wheezing or shortness of breath.   Yes [provider]  calcium acetate (PHOSLO) 667 MG capsule Take 1,334 mg by mouth 3 (three) times daily with meals. Take 2001 mgs with meals 3 times daily and 1334 with snacks 12/11/15  Yes [provider]  carvedilol (COREG) 6.25 MG tablet Take 1 tablet (6.25 mg total) by mouth 2 (two) times daily with a meal. 09/21/16  Yes Gouru, Aruna, MD  cinacalcet (SENSIPAR) 90 MG tablet Take 90 mg by mouth daily.   Yes [provider]  hydrALAZINE (APRESOLINE) 50 MG tablet Take 100 mg by mouth 3 (three) times daily.  07/01/16  Yes [provider]  ipratropium (ATROVENT HFA) 17 MCG/ACT inhaler Inhale 2 puffs into the lungs every 4 (four) hours as needed for wheezing.   Yes [provider]   isosorbide mononitrate (IMDUR) 30 MG 24 hr tablet Take 1 tablet (30 mg total) by mouth daily. 08/24/16 11/22/16 Yes Dustin Flock, MD  losartan (COZAAR) 25 MG tablet Take 1 tablet (25 mg total) by mouth at bedtime. 09/21/16  Yes Gouru, Illene Silver, MD  multivitamin (RENA-VIT) TABS tablet Take 1 tablet by mouth daily.   Yes [provider]  nitroGLYCERIN (NITROSTAT) 0.4 MG SL tablet Place 0.4 mg under the tongue every 5 (five) minutes as needed for chest pain.   Yes [provider]  torsemide (DEMADEX) 20 MG tablet Take 1 tablet (20 mg total) by mouth daily. 09/07/16 10/07/16 Yes Darylene Price A, FNP  lidocaine-prilocaine (EMLA) cream Apply 1 application topically as needed (dialysis access).    [provider]   Review of Systems  Constitutional: Positive for fatigue. Negative for appetite change.  HENT: Negative for congestion, postnasal drip and sore throat.   Eyes: Negative.   Respiratory: Positive for shortness of breath. Negative for cough, chest tightness and wheezing.  Cardiovascular: Positive for leg swelling. Negative for chest pain and palpitations.  Gastrointestinal: Negative for abdominal distention and abdominal pain.  Endocrine: Negative.   Genitourinary: Negative.   Musculoskeletal: Negative for back pain and neck pain.  Skin: Negative.   Allergic/Immunologic: Negative.   Neurological: Negative for dizziness and light-headedness.  Hematological: Negative for adenopathy. Does not bruise/bleed easily.  Psychiatric/Behavioral: Negative for dysphoric mood and sleep disturbance (wearing oxygen @ 2L at bedtime and PRN). The patient is not nervous/anxious.    Vitals:   10/05/16 0807  BP: 117/60  Pulse: 70  Resp: 18  SpO2: 95%  Weight: 169 lb 2 oz (76.7 kg)  Height: 5\' 9"  (1.753 m)   Wt Readings from Last 3 Encounters:  10/05/16 169 lb 2 oz (76.7 kg)  09/21/16 163 lb 8 oz (74.2 kg)  09/07/16 170 lb 4 oz (77.2 kg)    Lab Results  Component Value Date    CREATININE 6.97 (H) 09/21/2016   CREATININE 10.22 (H) 09/20/2016   CREATININE 9.19 (H) 09/19/2016   Physical Exam  Constitutional: He is oriented to person, place, and time. He appears well-developed and well-nourished.  HENT:  Head: Normocephalic and atraumatic.  Neck: Normal range of motion. Neck supple. No JVD present.  Cardiovascular: Normal rate and regular rhythm.   Pulmonary/Chest: Effort normal. He has no wheezes. He has no rales.  Abdominal: Soft. He exhibits no distension. There is no tenderness.  Musculoskeletal: He exhibits edema (trace pitting edema in bilateral lower legs). He exhibits no tenderness.  Neurological: He is alert and oriented to person, place, and time.  Skin: Skin is warm and dry.  Psychiatric: He has a normal mood and affect. His behavior is normal. Thought content normal.  Nursing note and vitals reviewed.  Assessment & Plan:  1: Chronic heart failure with reduced ejection fraction- - NYHA class III - minimally fluid overloaded today - has not been weighing daily and he was encouraged to resume so that he can call for an overnight weight gain of >2 pounds or a weekly weight gain of >5 pounds - not adding salt and trying to follow a low sodium diet - approval from Dr. Candiss Norse (nephrology) to switch losartan to entresto - advised patient to stop losartan and the following day begin entresto 24/26mg  twice daily; voucher given to patient so that he can get the first 30 days free - does have a history of hyperkalemia so would need to monitor closely; gets labs drawn at dialysis - sees cardiologist (End) on 11/23/16 - now wearing oxygen @ 2L at bedtime and as needed during the day and reports sleeping really well in the bed now. Has been wearing oxygen for about the last 3 weeks  2: HTN- - BP looks good today  3: Chronic kidney disease- - does dialysis M, W, F  4: Tobacco use- - continues to smoke  - cessation discussed for 3 minutes with  him  Medication bottles were reviewed.  Return in 2 weeks or sooner for any questions/problems before then.

## 2016-10-21 ENCOUNTER — Ambulatory Visit: Payer: Medicare Other | Admitting: Family

## 2016-10-26 ENCOUNTER — Ambulatory Visit: Payer: Medicare Other | Attending: Family | Admitting: Family

## 2016-10-26 ENCOUNTER — Encounter: Payer: Self-pay | Admitting: Family

## 2016-10-26 VITALS — BP 126/72 | HR 66 | Resp 20 | Ht 69.0 in | Wt 167.4 lb

## 2016-10-26 DIAGNOSIS — F17210 Nicotine dependence, cigarettes, uncomplicated: Secondary | ICD-10-CM | POA: Diagnosis not present

## 2016-10-26 DIAGNOSIS — Z833 Family history of diabetes mellitus: Secondary | ICD-10-CM | POA: Diagnosis not present

## 2016-10-26 DIAGNOSIS — Z992 Dependence on renal dialysis: Secondary | ICD-10-CM | POA: Diagnosis not present

## 2016-10-26 DIAGNOSIS — I5042 Chronic combined systolic (congestive) and diastolic (congestive) heart failure: Secondary | ICD-10-CM | POA: Diagnosis present

## 2016-10-26 DIAGNOSIS — Z9889 Other specified postprocedural states: Secondary | ICD-10-CM | POA: Insufficient documentation

## 2016-10-26 DIAGNOSIS — I739 Peripheral vascular disease, unspecified: Secondary | ICD-10-CM | POA: Insufficient documentation

## 2016-10-26 DIAGNOSIS — Z882 Allergy status to sulfonamides status: Secondary | ICD-10-CM | POA: Insufficient documentation

## 2016-10-26 DIAGNOSIS — I10 Essential (primary) hypertension: Secondary | ICD-10-CM

## 2016-10-26 DIAGNOSIS — Z8249 Family history of ischemic heart disease and other diseases of the circulatory system: Secondary | ICD-10-CM | POA: Insufficient documentation

## 2016-10-26 DIAGNOSIS — I5022 Chronic systolic (congestive) heart failure: Secondary | ICD-10-CM

## 2016-10-26 DIAGNOSIS — I272 Pulmonary hypertension, unspecified: Secondary | ICD-10-CM | POA: Diagnosis not present

## 2016-10-26 DIAGNOSIS — Z720 Tobacco use: Secondary | ICD-10-CM

## 2016-10-26 DIAGNOSIS — R0602 Shortness of breath: Secondary | ICD-10-CM | POA: Insufficient documentation

## 2016-10-26 DIAGNOSIS — M1990 Unspecified osteoarthritis, unspecified site: Secondary | ICD-10-CM | POA: Diagnosis not present

## 2016-10-26 DIAGNOSIS — R079 Chest pain, unspecified: Secondary | ICD-10-CM | POA: Insufficient documentation

## 2016-10-26 DIAGNOSIS — Z79899 Other long term (current) drug therapy: Secondary | ICD-10-CM | POA: Diagnosis not present

## 2016-10-26 DIAGNOSIS — I132 Hypertensive heart and chronic kidney disease with heart failure and with stage 5 chronic kidney disease, or end stage renal disease: Secondary | ICD-10-CM | POA: Diagnosis not present

## 2016-10-26 DIAGNOSIS — N186 End stage renal disease: Secondary | ICD-10-CM

## 2016-10-26 MED ORDER — ATORVASTATIN CALCIUM 40 MG PO TABS: 40.0000 mg | ORAL_TABLET | Freq: Every day | ORAL | 3 refills | Status: DC

## 2016-10-26 NOTE — Progress Notes (Signed)
Patient ID: [name], male    DOB: Aug 31, 1955, 61 y.o.   MRN: [id number]  HPI  [name] is a 61 y/o male with a history of HTN, chronic kidney diease (dialysis dependent), PVD, pulmonary HTN, hepatitis, arthritis, current tobacco use and chronic heart failure.   Reviewed last echo report from 04/30/16 which showed an EF of 20-25% along with moderate TR and severe MR and moderate AR.   Admitted 09/19/16 with HF exacerbation. Chronically elevated troponin. Cardiology consult obtained. Dialysis continued and he was discharged home after 2 days. Admitted 08/22/16 with HF exacerbation. Given IV lasix without much output but after dialysis, symptoms improved. Discharged home after 2 days. Was in the ED 07/13/16 due to dyspnea and chest pain. CXR was negative. Chronic troponin elevation. Discharged home. Admitted 07/04/16 due to severe hyperkalemia. Received emergent dialysis with improvement in potassium level. Discharged home the next day. Admitted 06/06/16 due to HF exacerbation. Received IV lasix along with dialysis. Initially required bipap but was weaned off of that. Discharged after 2 days.   He presents today for a follow-up visit with a chief complaint of mild shortness of breath upon minimal exertion. He describes this as chronic in nature having been present for several years with varying levels of severity. He has associated fatigue and edema along with this. Admits that he's not been weighing himself daily although gets weighed at dialysis three times a week.   Past Medical History:  Diagnosis Date  . Arthritis   . Chest pain    a. 09/2013 Myoview [facility]): basal inf defect more pronounced @ rest, likely artifact, EF 48%, low risk study.  . Chronic combined systolic and diastolic CHF (congestive heart failure) ([place])    a. 10/2013 Echo [facility]): EF 50%, diast dysfxn, triv AI/TR, mild MR;  b. 10/2014 Echo: EF 30-35%, diff HK, gr1 DD, mild AI, mild to mod MR/TR, mod dil LA, nl RV, mod to sev PAH;  c.  04/2016 Echo: EF 20-25%, sev dil LV, diff HK, gr3 DD, mod AI, MV syst bowing w/ prolapse, mod dil RV, mod TR.  [name] Dialysis patient ([place])    Mon, Wed, Fri  . Dyspnea   . ESRD (end stage renal disease) ([place])    a. MWF dialysis @ [facility].  . Hepatitis    Patient is unsure type of Hepatitis he has  . Hypertension   . Hypertensive heart disease with CHF (congestive heart failure) ([place])   . Peripheral vascular disease ([place])   . Pulmonary hypertension ([place]) 04/2016  . Renal insufficiency    Past Surgical History:  Procedure Laterality Date  . A/V FISTULAGRAM Left 04/06/2016   Procedure: A/V Fistulagram;  Surgeon: Katha Cabal, MD;  Location: [place] [employer] [facility];  Service: Cardiovascular;  Laterality: Left;  . A/V SHUNT INTERVENTION N/A 04/06/2016   Procedure: A/V Shunt Intervention;  Surgeon: Katha Cabal, MD;  Location: [place] [facility];  Service: Cardiovascular;  Laterality: N/A;  . A/V SHUNT INTERVENTION N/A 06/29/2016   Procedure: A/V Shunt Intervention;  Surgeon: Katha Cabal, MD;  Location: [place] [facility];  Service: Cardiovascular;  Laterality: N/A;  . AV FISTULA PLACEMENT Left 2014  . DIALYSIS/PERMA CATHETER INSERTION Right    Dr. Delana Meyer  . PERIPHERAL VASCULAR CATHETERIZATION N/A 10/03/2014   Procedure: A/V Shuntogram/Fistulagram;  Surgeon: Algernon Huxley, MD;  Location: [place] [facility];  Service: Cardiovascular;  Laterality: N/A;  . PERIPHERAL VASCULAR CATHETERIZATION Left 10/03/2014   Procedure: A/V Shunt  Intervention;  Surgeon: Algernon Huxley, MD;  Location: [place] [facility];  Service: Cardiovascular;  Laterality: Left;  . PERIPHERAL VASCULAR CATHETERIZATION Left 05/01/2015   Procedure: A/V Shuntogram/Fistulagram;  Surgeon: Algernon Huxley, MD;  Location: [place] [facility];  Service: Cardiovascular;  Laterality: Left;  . PERIPHERAL VASCULAR CATHETERIZATION N/A 05/01/2015   Procedure: A/V Shunt Intervention;  Surgeon: Algernon Huxley, MD;  Location: [place] [facility];  Service: Cardiovascular;  Laterality: N/A;  . UPPER EXTREMITY ANGIOGRAPHY Bilateral 06/29/2016   Procedure: Upper Extremity Angiography;  Surgeon: Katha Cabal, MD;  Location: [place] [facility];  Service: Cardiovascular;  Laterality: Bilateral;  . UPPER EXTREMITY INTERVENTION  06/29/2016   Procedure: Upper Extremity Intervention;  Surgeon: Katha Cabal, MD;  Location: [place] [facility];  Service: Cardiovascular;;   Family History  Problem Relation Age of Onset  . Hypertension Son   . Diabetes Son   . Hypertension Mother   . Diabetes Mother   . Diabetes Sister    Social History  Substance Use Topics  . Smoking status: Current Some Day Smoker    Packs/day: 0.25    Years: 40.00    Types: Cigarettes  . Smokeless tobacco: Never Used  . Alcohol use No   Allergies  Allergen Reactions  . Sulfa Antibiotics Rash   Prior to Admission medications   Medication Sig Start Date End Date Taking? Authorizing Provider  acetaminophen (TYLENOL) 325 MG tablet Take 2 tablets (650 mg total) by mouth every 6 (six) hours as needed for mild pain (or Fever >/= 101). Patient taking differently: Take 500 mg by mouth every 6 (six) hours as needed for mild pain (or Fever >/= 101).  09/21/16  Yes Gouru, Aruna, MD  albuterol (PROVENTIL HFA;VENTOLIN HFA) 108 (90 Base) MCG/ACT inhaler Inhale into the lungs every 4 (four) hours as needed for wheezing or shortness of breath.   Yes [provider]  calcium acetate (PHOSLO) 667 MG capsule Take 1,334 mg by mouth 3 (three) times daily with meals. Take 2001 mgs with meals 3 times daily and 1334 with snacks 12/11/15  Yes [provider]  carvedilol (COREG) 6.25 MG tablet Take 1 tablet (6.25 mg total) by mouth 2 (two) times daily with a meal. 09/21/16  Yes Gouru, Aruna, MD  cinacalcet (SENSIPAR) 90 MG tablet Take 90 mg by mouth daily.   Yes [provider]  hydrALAZINE (APRESOLINE) 50 MG tablet Take 100 mg by mouth 3  (three) times daily.  07/01/16  Yes [provider]  ipratropium (ATROVENT HFA) 17 MCG/ACT inhaler Inhale 2 puffs into the lungs every 4 (four) hours as needed for wheezing.   Yes [provider]  lidocaine-prilocaine (EMLA) cream Apply 1 application topically as needed (dialysis access).   Yes [provider]  multivitamin (RENA-VIT) TABS tablet Take 1 tablet by mouth daily.   Yes [provider]  nitroGLYCERIN (NITROSTAT) 0.4 MG SL tablet Place 0.4 mg under the tongue every 5 (five) minutes as needed for chest pain.   Yes [provider]  sacubitril-valsartan (ENTRESTO) 24-26 MG Take 1 tablet by mouth 2 (two) times daily. 10/05/16  Yes Darylene Price A, FNP  torsemide (DEMADEX) 20 MG tablet Take 1 tablet (20 mg total) by mouth daily. 09/07/16 10/26/16 Yes Darylene Price A, FNP  isosorbide mononitrate (IMDUR) 30 MG 24 hr tablet Take 1 tablet (30 mg total) by mouth daily. Patient not taking: Reported on 10/26/2016 08/24/16 11/22/16  Dustin Flock, MD  Review of Systems  Constitutional: Positive for fatigue. Negative for appetite change.  HENT: Negative for congestion, postnasal drip and sore throat.   Eyes: Negative.   Respiratory: Positive for cough and shortness of breath. Negative for chest tightness and wheezing.   Cardiovascular: Positive for leg swelling. Negative for chest pain and palpitations.  Gastrointestinal: Negative for abdominal distention and abdominal pain.  Endocrine: Negative.   Genitourinary: Negative.   Musculoskeletal: Negative for back pain and neck pain.  Skin: Negative.   Allergic/Immunologic: Negative.   Neurological: Negative for dizziness and light-headedness.  Hematological: Negative for adenopathy. Does not bruise/bleed easily.  Psychiatric/Behavioral: Negative for dysphoric mood and sleep disturbance (wearing oxygen @ 2L at bedtime and PRN). The patient is not nervous/anxious.    Vitals:   10/26/16 1017  BP: 126/72   Pulse: 66  Resp: 20  SpO2: 98%  Weight: 167 lb 6 oz (75.9 kg)  Height: 5\' 9"  (1.753 m)   Wt Readings from Last 3 Encounters:  10/26/16 167 lb 6 oz (75.9 kg)  10/05/16 169 lb 2 oz (76.7 kg)  09/21/16 163 lb 8 oz (74.2 kg)    Lab Results  Component Value Date   CREATININE 6.97 (H) 09/21/2016   CREATININE 10.22 (H) 09/20/2016   CREATININE 9.19 (H) 09/19/2016   Physical Exam  Constitutional: He is oriented to person, place, and time. He appears well-developed and well-nourished.  HENT:  Head: Normocephalic and atraumatic.  Neck: Normal range of motion. Neck supple. No JVD present.  Cardiovascular: Normal rate and regular rhythm.   Pulmonary/Chest: Effort normal. He has no wheezes. He has no rales.  Abdominal: Soft. He exhibits no distension. There is no tenderness.  Musculoskeletal: He exhibits edema (1+ pitting edema in bilateral lower legs). He exhibits no tenderness.  Neurological: He is alert and oriented to person, place, and time.  Skin: Skin is warm and dry.  Psychiatric: He has a normal mood and affect. His behavior is normal. Thought content normal.  Nursing note and vitals reviewed.  Assessment & Plan:  1: Chronic heart failure with reduced ejection fraction- - NYHA class III - minimally fluid overloaded today - has not been weighing daily and he was encouraged to resume so that he can call for an overnight weight gain of >2 pounds or a weekly weight gain of >5 pounds; says that his scales are broke so a new set was given to him today - not adding salt and trying to follow a low sodium diet - tolerating entresto without known side effects; discussed maybe increasing it at his next visit as he's only been taking it once daily as he forgets the 2nd dose; discussed taking it at suppertime - has been taking his carvedilol daily instead of twice daily as he's falling asleep before taking the 2nd dose; discussed taking the 2nd dose at suppertime when he eats - atorvastatin  RX sent to [facility] - sees cardiologist (End) on 11/23/16 - wearing oxygen @ 2L at bedtime and as needed during the day and reports sleeping really well in the bed now. Has been wearing oxygen for about the last 3 weeks - Pharm D went in and reviewed medications with the patient - awaiting labs from dialysis to see what his potassium level is  2: HTN- - BP looks good today - not taking isosorbide at this time  3: Chronic kidney disease- - does dialysis M, W, F  4: Tobacco use- - has not smoked any in the last 2  weeks - congratulated him on this and encouraged him to continue not smoking  Medication bottles were reviewed.  Return in 1 month or sooner for any questions/problems before then.

## 2016-10-26 NOTE — Patient Instructions (Signed)
Resume weighing daily and call for an overnight weight gain of > 2 pounds or a weekly weight gain of >5 pounds. 

## 2016-11-23 ENCOUNTER — Ambulatory Visit: Payer: Medicare Other | Admitting: Internal Medicine

## 2016-11-25 ENCOUNTER — Ambulatory Visit: Payer: Medicare Other | Admitting: Family

## 2016-11-25 ENCOUNTER — Telehealth: Payer: Self-pay | Admitting: Family

## 2016-11-25 NOTE — Telephone Encounter (Signed)
Patient did not show for his [facility] appointment on 11/25/16. Will attempt to reschedule.

## 2017-01-17 ENCOUNTER — Encounter (INDEPENDENT_AMBULATORY_CARE_PROVIDER_SITE_OTHER): Payer: Self-pay

## 2017-01-18 ENCOUNTER — Other Ambulatory Visit (INDEPENDENT_AMBULATORY_CARE_PROVIDER_SITE_OTHER): Payer: Self-pay | Admitting: Vascular Surgery

## 2017-01-18 MED ORDER — CEFAZOLIN SODIUM-DEXTROSE 1-4 GM/50ML-% IV SOLN
1.0000 g | Freq: Once | INTRAVENOUS | Status: AC
  Administered 2017-01-19: 1 g via INTRAVENOUS

## 2017-01-19 ENCOUNTER — Ambulatory Visit
Admission: RE | Admit: 2017-01-19 | Discharge: 2017-01-19 | Disposition: A | Discharge status: Home / Self Care | Payer: Medicare Other | Source: Ambulatory Visit | Attending: Vascular Surgery | Admitting: Vascular Surgery

## 2017-01-19 ENCOUNTER — Encounter
Admission: RE | Disposition: A | Discharge status: Home / Self Care | Payer: Self-pay | Source: Ambulatory Visit | Attending: Vascular Surgery

## 2017-01-19 DIAGNOSIS — I2510 Atherosclerotic heart disease of native coronary artery without angina pectoris: Secondary | ICD-10-CM | POA: Diagnosis not present

## 2017-01-19 DIAGNOSIS — I132 Hypertensive heart and chronic kidney disease with heart failure and with stage 5 chronic kidney disease, or end stage renal disease: Secondary | ICD-10-CM | POA: Insufficient documentation

## 2017-01-19 DIAGNOSIS — T82858A Stenosis of vascular prosthetic devices, implants and grafts, initial encounter: Secondary | ICD-10-CM | POA: Insufficient documentation

## 2017-01-19 DIAGNOSIS — Z87891 Personal history of nicotine dependence: Secondary | ICD-10-CM | POA: Diagnosis not present

## 2017-01-19 DIAGNOSIS — N186 End stage renal disease: Secondary | ICD-10-CM | POA: Diagnosis not present

## 2017-01-19 DIAGNOSIS — Z8249 Family history of ischemic heart disease and other diseases of the circulatory system: Secondary | ICD-10-CM | POA: Insufficient documentation

## 2017-01-19 DIAGNOSIS — I5042 Chronic combined systolic (congestive) and diastolic (congestive) heart failure: Secondary | ICD-10-CM | POA: Insufficient documentation

## 2017-01-19 DIAGNOSIS — T82868A Thrombosis of vascular prosthetic devices, implants and grafts, initial encounter: Secondary | ICD-10-CM | POA: Diagnosis not present

## 2017-01-19 DIAGNOSIS — Z9889 Other specified postprocedural states: Secondary | ICD-10-CM | POA: Diagnosis not present

## 2017-01-19 DIAGNOSIS — M1990 Unspecified osteoarthritis, unspecified site: Secondary | ICD-10-CM | POA: Diagnosis not present

## 2017-01-19 DIAGNOSIS — Z882 Allergy status to sulfonamides status: Secondary | ICD-10-CM | POA: Diagnosis not present

## 2017-01-19 DIAGNOSIS — I10 Essential (primary) hypertension: Secondary | ICD-10-CM | POA: Diagnosis not present

## 2017-01-19 DIAGNOSIS — Z992 Dependence on renal dialysis: Secondary | ICD-10-CM | POA: Diagnosis not present

## 2017-01-19 DIAGNOSIS — K759 Inflammatory liver disease, unspecified: Secondary | ICD-10-CM | POA: Diagnosis not present

## 2017-01-19 DIAGNOSIS — Z833 Family history of diabetes mellitus: Secondary | ICD-10-CM | POA: Diagnosis not present

## 2017-01-19 DIAGNOSIS — Y832 Surgical operation with anastomosis, bypass or graft as the cause of abnormal reaction of the patient, or of later complication, without mention of misadventure at the time of the procedure: Secondary | ICD-10-CM | POA: Insufficient documentation

## 2017-01-19 DIAGNOSIS — I272 Pulmonary hypertension, unspecified: Secondary | ICD-10-CM | POA: Diagnosis not present

## 2017-01-19 HISTORY — PX: A/V FISTULAGRAM: CATH118298

## 2017-01-19 LAB — POTASSIUM (ARMC VASCULAR LAB ONLY): Potassium (ARMC vascular lab): 5.9 — ABNORMAL HIGH (ref 3.5–5.1)

## 2017-01-19 SURGERY — A/V FISTULAGRAM
Anesthesia: Moderate Sedation | Laterality: Left

## 2017-01-19 MED ORDER — SODIUM CHLORIDE 0.9 % IV SOLN
INTRAVENOUS | Status: DC
  Administered 2017-01-19: 10:00:00 via INTRAVENOUS

## 2017-01-19 MED ORDER — FENTANYL CITRATE (PF) 100 MCG/2ML IJ SOLN
INTRAMUSCULAR | Status: DC | PRN
  Administered 2017-01-19: 50 ug via INTRAVENOUS
  Administered 2017-01-19 (×2): 25 ug via INTRAVENOUS

## 2017-01-19 MED ORDER — MIDAZOLAM HCL 5 MG/5ML IJ SOLN
INTRAMUSCULAR | Status: AC
  Filled 2017-01-19: qty 5

## 2017-01-19 MED ORDER — IOPAMIDOL (ISOVUE-300) INJECTION 61%
INTRAVENOUS | Status: DC | PRN
  Administered 2017-01-19: 20 mL via INTRAVENOUS

## 2017-01-19 MED ORDER — LIDOCAINE HCL (PF) 1 % IJ SOLN
INTRAMUSCULAR | Status: DC | PRN
  Administered 2017-01-19: 10 mL

## 2017-01-19 MED ORDER — HEPARIN SODIUM (PORCINE) 1000 UNIT/ML IJ SOLN
INTRAMUSCULAR | Status: AC
  Filled 2017-01-19: qty 1

## 2017-01-19 MED ORDER — LIDOCAINE HCL (PF) 1 % IJ SOLN
INTRAMUSCULAR | Status: AC
  Filled 2017-01-19: qty 30

## 2017-01-19 MED ORDER — HYDROMORPHONE HCL 1 MG/ML IJ SOLN: 1.0000 mg | Freq: Once | INTRAMUSCULAR | Status: DC | PRN

## 2017-01-19 MED ORDER — FAMOTIDINE 20 MG PO TABS: 40.0000 mg | ORAL_TABLET | ORAL | Status: DC | PRN

## 2017-01-19 MED ORDER — ONDANSETRON HCL 4 MG/2ML IJ SOLN: 4.0000 mg | Freq: Four times a day (QID) | INTRAMUSCULAR | Status: DC | PRN

## 2017-01-19 MED ORDER — FENTANYL CITRATE (PF) 100 MCG/2ML IJ SOLN
INTRAMUSCULAR | Status: AC
  Filled 2017-01-19: qty 2

## 2017-01-19 MED ORDER — HEPARIN (PORCINE) IN NACL 2-0.9 UNIT/ML-% IJ SOLN
INTRAMUSCULAR | Status: AC
  Filled 2017-01-19: qty 1000

## 2017-01-19 MED ORDER — METHYLPREDNISOLONE SODIUM SUCC 125 MG IJ SOLR: 125.0000 mg | INTRAMUSCULAR | Status: DC | PRN

## 2017-01-19 MED ORDER — MIDAZOLAM HCL 2 MG/2ML IJ SOLN
INTRAMUSCULAR | Status: DC | PRN
  Administered 2017-01-19: 2 mg via INTRAVENOUS
  Administered 2017-01-19 (×2): 1 mg via INTRAVENOUS

## 2017-01-19 SURGICAL SUPPLY — 8 items
CATH BEACON 5 .035 65 KMP TIP (CATHETERS) ×3 IMPLANT
DEVICE TORQUE (MISCELLANEOUS) ×3 IMPLANT
GUIDEWIRE ANGLED .035 180CM (WIRE) ×3 IMPLANT
NEEDLE ENTRY 21GA 7CM ECHOTIP (NEEDLE) ×3 IMPLANT
PACK ANGIOGRAPHY (CUSTOM PROCEDURE TRAY) ×3 IMPLANT
SET INTRO CAPELLA COAXIAL (SET/KITS/TRAYS/PACK) ×9 IMPLANT
SHEATH BRITE TIP 6FRX5.5 (SHEATH) ×3 IMPLANT
SUT MNCRL AB 4-0 PS2 18 (SUTURE) ×3 IMPLANT

## 2017-01-19 NOTE — Op Note (Signed)
OPERATIVE NOTE   PROCEDURE: 1. Contrast injection left wrist fistula 2. Introduction catheter into superior vena cava with left arm venogram  PRE-OPERATIVE DIAGNOSIS: Complication of dialysis access                                                       End Stage Renal Disease  POST-OPERATIVE DIAGNOSIS: same as above   SURGEON: Katha Cabal, M.D.  ANESTHESIA: Conscious sedation was administered under my direct supervision by the interventional radiology RN.  IV Versed plus fentanyl were utilized. Continuous ECG, pulse oximetry and blood pressure was monitored throughout the entire procedure.  Conscious sedation was for a total of 26 minutes.  ESTIMATED BLOOD LOSS: minimal  FINDING(S): 1. AV fistula thrombosed.  Basilic vein and venography appears marginal.  There is moderate approximately 40-50% stenosis of the innominate vein on the left.  SPECIMEN(S):  None  CONTRAST: 30 cc  FLUOROSCOPY TIME: 3.5 minutes  INDICATIONS: [name] is a 61 y.o. male who  presents with malfunctioning left wrist AV access.  The patient is scheduled for angiography with possible intervention of the AV access to prevent loss of the permanent access.  The patient is aware the risks include but are not limited to: bleeding, infection, thrombosis of the cannulated access, and possible anaphylactic reaction to the contrast.  The patient acknowledges if the access can not be salvaged a tunneled catheter will be needed and will be placed during this procedure.  The patient is aware of the risks of the procedure and elects to proceed with the angiogram and intervention.  DESCRIPTION: After full informed written consent was obtained, the patient was brought back to the Special Procedure suite and placed supine position.  Appropriate cardiopulmonary monitors were placed.  The left arm was prepped and draped in the standard fashion.  Appropriate timeout is called.   The left wrist access was cannulated with  a micropuncture needle under ultrasound guidence.  Ultrasound was used to evaluate the left wrist access.  It was echolucent and compressible indicating it is patent .  An ultrasound image was acquired for the permanent record.  A micropuncture needle was used to access the left wrist access under direct ultrasound guidance.  The microwire was then advanced under fluoroscopic guidance without difficulty followed by the micro-sheath.   Hand injections were completed to image the access from the arterial anastomosis through the entire access.    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  Therefore, the ultrasound was returned to the field in a sterile sleeve.  A large vein in the mid forearm  Was identified it was echolucent and compressible indicating patency.  Under direct ultrasound visualization a micro needle was inserted followed by microwire followed by a micro-sheath.  Image was recorded for the permanent record.  Hand-injection of contrast through this forearm vein was then used to create images of the left upper extremity venous system.  Imaging of the central veins was inadequate and the Glidewire and a KMP catheter were then advanced into the subclavian vein and then the innominate vein where hand injection of contrast was used to demonstrate the central venous anatomy.    A 4-0 Monocryl purse-string suture was sewn around the sheath.  The sheath was removed and light pressure  was applied.  A sterile bandage was applied to the puncture site.   SUMMARY: The patient's AV fistula is thrombosed and nonsalvageable given the large aneurysmal deterioration.  Imaging suggested the basilic vein may be adequate for creation of a basilic transposition in the left upper arm.  By separate access and venography the basilic vein may be somewhat small.  Central veins do have some moderate stenosis but likely would support 8 AV fistula.  I  will plan to bring the patient back to the office and formally ultrasound of the basilic vein to obtain measurements and if adequate, meaning that the basilic vein is 4 mm or greater than I would plan for a basilic transposition in a single stage.  Central venous narrowings can be monitored and intervention could be performed if he develops symptomatic venous hypertension.   COMPLICATIONS: None  CONDITION: Unchanged  Katha Cabal, M.D [facility] Office: [phone]  01/19/2017 12:12 PM

## 2017-01-19 NOTE — H&P (Signed)
[facility] Admission History & Physical  MRN : [id number]  [name] is a 61 y.o. (06-14-1955) male who presents with chief complaint of pain in his left arm at his access site.  History of Present Illness: I am asked to evaluate the patient by the dialysis center. The patient was sent here because they were unable to achieve adequate dialysis this morning. Furthermore the Center states there is very poor thrill and bruit.  They actually admit to using his catheter which is been present for greater than 6 months.  He was last seen in the office in May at which time he was undergoing evaluation for planning a new access.  The patient states there there have been increasing problems with the access, such as "pulling clots" during dialysis and prolonged bleeding after decannulation. The patient estimates these problems have been going on for several months. The patient is unaware of any other change but states they have been using his catheter mostly because of this.  Patient denies pain or tenderness overlying the access.  There is no pain with dialysis.  The patient denies hand pain or finger pain consistent with steal syndrome.     Current Facility-Administered Medications  Medication Dose Route Frequency Provider Last Rate Last Dose  . 0.9 %  sodium chloride infusion   Intravenous Continuous Stegmayer, Kimberly A, PA-C 10 mL/hr at 01/19/17 1002    . ceFAZolin (ANCEF) IVPB 1 g/50 mL premix  1 g Intravenous Once Stegmayer, Kimberly A, PA-C      . famotidine (PEPCID) tablet 40 mg  40 mg Oral PRN Stegmayer, Janalyn Harder, PA-C      . HYDROmorphone (DILAUDID) injection 1 mg  1 mg Intravenous Once PRN Stegmayer, Kimberly A, PA-C      . methylPREDNISolone sodium succinate (SOLU-MEDROL) 125 mg/2 mL injection 125 mg  125 mg Intravenous PRN Stegmayer, Kimberly A, PA-C      . ondansetron (ZOFRAN) injection 4 mg  4 mg Intravenous Q6H PRN Stegmayer, Janalyn Harder, PA-C         Past Medical History:  Diagnosis Date  . Arthritis   . Chest pain    a. 09/2013 Myoview [facility]): basal inf defect more pronounced @ rest, likely artifact, EF 48%, low risk study.  . Chronic combined systolic and diastolic CHF (congestive heart failure) ([place])    a. 10/2013 Echo [facility]): EF 50%, diast dysfxn, triv AI/TR, mild MR;  b. 10/2014 Echo: EF 30-35%, diff HK, gr1 DD, mild AI, mild to mod MR/TR, mod dil LA, nl RV, mod to sev PAH;  c. 04/2016 Echo: EF 20-25%, sev dil LV, diff HK, gr3 DD, mod AI, MV syst bowing w/ prolapse, mod dil RV, mod TR.  [name] ([facility])    Mon, Wed, Fri  . Dyspnea   . ESRD (end stage renal disease) ([place])    a. MWF dialysis @ [facility].  . Hepatitis    Patient is unsure type of Hepatitis he has  . Hypertension   . Hypertensive heart disease with CHF (congestive heart failure) ([place])   . Peripheral vascular disease ([place])   . Pulmonary hypertension ([place]) 04/2016  . Renal insufficiency     Past Surgical History:  Procedure Laterality Date  . A/V FISTULAGRAM Left 04/06/2016   Procedure: A/V Fistulagram;  Surgeon: Katha Cabal, MD;  Location: [place] [facility];  Service: Cardiovascular;  Laterality: Left;  . A/V SHUNT INTERVENTION N/A 04/06/2016   Procedure: A/V Shunt Intervention;  Surgeon: Katha Cabal, MD;  Location: [place] [facility];  Service: Cardiovascular;  Laterality: N/A;  . A/V SHUNT INTERVENTION N/A 06/29/2016   Procedure: A/V Shunt Intervention;  Surgeon: Katha Cabal, MD;  Location: [place] [facility];  Service: Cardiovascular;  Laterality: N/A;  . AV FISTULA PLACEMENT Left 2014  . DIALYSIS/PERMA CATHETER INSERTION Right    Dr. Delana Meyer  . PERIPHERAL VASCULAR CATHETERIZATION N/A 10/03/2014   Procedure: A/V Shuntogram/Fistulagram;  Surgeon: Algernon Huxley, MD;  Location: [place] [facility];  Service: Cardiovascular;  Laterality: N/A;  . PERIPHERAL VASCULAR CATHETERIZATION Left 10/03/2014   Procedure: A/V Shunt  Intervention;  Surgeon: Algernon Huxley, MD;  Location: [place] [facility];  Service: Cardiovascular;  Laterality: Left;  . PERIPHERAL VASCULAR CATHETERIZATION Left 05/01/2015   Procedure: A/V Shuntogram/Fistulagram;  Surgeon: Algernon Huxley, MD;  Location: [place] [facility];  Service: Cardiovascular;  Laterality: Left;  . PERIPHERAL VASCULAR CATHETERIZATION N/A 05/01/2015   Procedure: A/V Shunt Intervention;  Surgeon: Algernon Huxley, MD;  Location: [place] [facility];  Service: Cardiovascular;  Laterality: N/A;  . UPPER EXTREMITY ANGIOGRAPHY Bilateral 06/29/2016   Procedure: Upper Extremity Angiography;  Surgeon: Katha Cabal, MD;  Location: [place] [facility];  Service: Cardiovascular;  Laterality: Bilateral;  . UPPER EXTREMITY INTERVENTION  06/29/2016   Procedure: Upper Extremity Intervention;  Surgeon: Katha Cabal, MD;  Location: [place] [facility];  Service: Cardiovascular;;    Social History Social History   Tobacco Use  . Smoking status: Former Smoker    Packs/day: 0.25    Years: 40.00    Pack years: 10.00    Types: Cigarettes    Last attempt to quit: 10/12/2016    Years since quitting: 0.2  . Smokeless tobacco: Never Used  Substance Use Topics  . Alcohol use: No    Alcohol/week: 0.0 oz  . Drug use: Yes    Frequency: 1.0 times per week    Types: Cocaine    Comment: 07/30/16 pt states last use 3-4 weeks ago    Family History Family History  Problem Relation Age of Onset  . Hypertension Son   . Diabetes Son   . Hypertension Mother   . Diabetes Mother   . Diabetes Sister     No family history of bleeding or clotting disorders, autoimmune disease or porphyria  Allergies  Allergen Reactions  . Sulfa Antibiotics Rash     REVIEW OF SYSTEMS (Negative unless checked)  Constitutional: [] Weight loss  [] Fever  [] Chills Cardiac: [] Chest pain   [] Chest pressure   [] Palpitations   [] Shortness of breath when laying flat   [] Shortness of breath at rest   [x] Shortness of  breath with exertion. Vascular:  [] Pain in legs with walking   [] Pain in legs at rest   [] Pain in legs when laying flat   [] Claudication   [] Pain in feet when walking  [] Pain in feet at rest  [] Pain in feet when laying flat   [] History of DVT   [] Phlebitis   [] Swelling in legs   [] Varicose veins   [] Non-healing ulcers Pulmonary:   [] Uses home oxygen   [] Productive cough   [] Hemoptysis   [] Wheeze  [] COPD   [] Asthma Neurologic:  [] Dizziness  [] Blackouts   [] Seizures   [] History of stroke   [] History of TIA  [] Aphasia   [] Temporary blindness   [] Dysphagia   [] Weakness or numbness in arms   [] Weakness or numbness in legs Musculoskeletal:  [] Arthritis   [] Joint  swelling   [] Joint pain   [] Low back pain Hematologic:  [] Easy bruising  [] Easy bleeding   [] Hypercoagulable state   [] Anemic  [] Hepatitis Gastrointestinal:  [] Blood in stool   [] Vomiting blood  [] Gastroesophageal reflux/heartburn   [] Difficulty swallowing. Genitourinary:  [x] Chronic kidney disease   [] Difficult urination  [] Frequent urination  [] Burning with urination   [] Blood in urine Skin:  [] Rashes   [] Ulcers   [] Wounds Psychological:  [] History of anxiety   []  History of major depression.  Physical Examination  Vitals:   01/19/17 0953  BP: (!) 150/112  Pulse: (!) 123  Resp: (!) 22  Temp: 98 F (36.7 C)  TempSrc: Oral  SpO2: 98%  Weight: 78.5 kg (173 lb)  Height: 5\' 9"  (1.753 m)   Body mass index is 25.55 kg/m. Gen: WD/WN, NAD Head: [place]/AT, No temporalis wasting. Prominent temp pulse not noted. Ear/Nose/Throat: Hearing grossly intact, nares w/o erythema or drainage, oropharynx w/o Erythema/Exudate,  Eyes: Conjunctiva clear, sclera non-icteric Neck: Trachea midline.  No JVD.  Pulmonary:  Good air movement, respirations not labored, no use of accessory muscles.  Cardiac: RRR, normal S1, S2. Vascular: Left arm AV fistula markedly aneurysmal and pulsatile; tunnel catheter clean dry and intact Vessel Right Left  Radial Palpable  Palpable  Ulnar Not Palpable Not Palpable  Brachial Palpable Palpable  Carotid Palpable, without bruit Palpable, without bruit  Gastrointestinal: soft, non-tender/non-distended. No guarding/reflex.  Musculoskeletal: M/S 5/5 throughout.  Extremities without ischemic changes.  No deformity or atrophy.  Neurologic: Sensation grossly intact in extremities.  Symmetrical.  Speech is fluent. Motor exam as listed above. Psychiatric: Judgment intact, Mood & affect appropriate for pt's clinical situation. Dermatologic: No rashes or ulcers noted.  No cellulitis or open wounds. Lymph : No Cervical, Axillary, or Inguinal lymphadenopathy.   CBC Lab Results  Component Value Date   WBC 4.7 09/21/2016   HGB 9.3 (L) 09/21/2016   HCT 27.8 (L) 09/21/2016   MCV 92.7 09/21/2016   PLT 194 09/21/2016    BMET    Component Value Date/Time   NA 139 09/21/2016 0454   NA 139 04/30/2013 1203   K 4.7 09/21/2016 0454   K 4.4 04/30/2013 1203   CL 100 (L) 09/21/2016 0454   CL 107 04/30/2013 1203   CO2 27 09/21/2016 0454   CO2 25 04/30/2013 1203   GLUCOSE 65 09/21/2016 0454   GLUCOSE 104 (H) 04/30/2013 1203   BUN 50 (H) 09/21/2016 0454   BUN 76 (H) 04/30/2013 1203   CREATININE 6.97 (H) 09/21/2016 0454   CREATININE 9.49 (H) 04/30/2013 1203   CALCIUM 8.2 (L) 09/21/2016 0454   CALCIUM 8.0 (L) 04/30/2013 1203   GFRNONAA 8 (L) 09/21/2016 0454   GFRNONAA 5 (L) 04/30/2013 1203   GFRAA 9 (L) 09/21/2016 0454   GFRAA 6 (L) 04/30/2013 1203   CrCl cannot be calculated (Patient's most recent lab result is older than the maximum 21 days allowed.).  COAG Lab Results  Component Value Date   INR 1.10 07/27/2016   INR 1.1 04/26/2013   INR 1.0 07/19/2012    Radiology No results found.  Assessment/Plan 1.  Complication dialysis device with thrombosis AV access:  Patient's left arm AV dialysis access is malfunctioning. The patient will undergo angiography and correction of any problems using interventional  techniques with the hope of restoring function to the access.  The risks and benefits were described to the patient.  All questions were answered.  The patient agrees to proceed with angiography and intervention.  Potassium will be drawn to ensure that it is an appropriate level prior to performing intervention.  With respect to his previous evaluation arteriography in May 2018 demonstrated that his arterial inflow is better on the left.  This would suggest further revisions and/or creations of new access should focus on the left arm.  We will evaluate the central venous system on the left today as part of his fistulogram.  2.  End-stage renal disease requiring hemodialysis:  Patient will continue dialysis therapy without further interruption if a successful intervention is not achieved then a tunneled catheter will be placed. Dialysis has already been arranged. 3.  Hypertension:  Patient will continue medical management; nephrology is following no changes in oral medications. 4.  Coronary artery disease:  EKG will be monitored. Nitrates will be used if needed. The patient's oral cardiac medications will be continued.    Hortencia Pilar, MD  01/19/2017 11:18 AM

## 2017-01-19 NOTE — H&P (Signed)
[facility] History & Physical Update  The patient was interviewed and re-examined.  The patient's previous History and Physical has been reviewed and is unchanged.  There is no change in the plan of care. We plan to proceed with the scheduled procedure.  Hortencia Pilar, MD  01/19/2017, 9:51 AM

## 2017-01-20 ENCOUNTER — Encounter: Payer: Self-pay | Admitting: Vascular Surgery

## 2017-02-15 ENCOUNTER — Emergency Department: Payer: Medicare Other

## 2017-02-15 ENCOUNTER — Encounter: Payer: Self-pay | Admitting: Emergency Medicine

## 2017-02-15 ENCOUNTER — Inpatient Hospital Stay
Admission: EM | Admit: 2017-02-15 | Discharge: 2017-02-18 | DRG: 291 | Disposition: A | Discharge status: Home / Self Care | Payer: Medicare Other | Attending: Internal Medicine | Admitting: Internal Medicine

## 2017-02-15 DIAGNOSIS — N186 End stage renal disease: Secondary | ICD-10-CM

## 2017-02-15 DIAGNOSIS — R001 Bradycardia, unspecified: Secondary | ICD-10-CM | POA: Diagnosis not present

## 2017-02-15 DIAGNOSIS — I272 Pulmonary hypertension, unspecified: Secondary | ICD-10-CM | POA: Diagnosis present

## 2017-02-15 DIAGNOSIS — D631 Anemia in chronic kidney disease: Secondary | ICD-10-CM | POA: Diagnosis present

## 2017-02-15 DIAGNOSIS — N2581 Secondary hyperparathyroidism of renal origin: Secondary | ICD-10-CM | POA: Diagnosis present

## 2017-02-15 DIAGNOSIS — G40909 Epilepsy, unspecified, not intractable, without status epilepticus: Secondary | ICD-10-CM | POA: Diagnosis present

## 2017-02-15 DIAGNOSIS — R0602 Shortness of breath: Secondary | ICD-10-CM

## 2017-02-15 DIAGNOSIS — I5043 Acute on chronic combined systolic (congestive) and diastolic (congestive) heart failure: Secondary | ICD-10-CM | POA: Diagnosis present

## 2017-02-15 DIAGNOSIS — E876 Hypokalemia: Secondary | ICD-10-CM | POA: Diagnosis not present

## 2017-02-15 DIAGNOSIS — J9601 Acute respiratory failure with hypoxia: Secondary | ICD-10-CM | POA: Diagnosis present

## 2017-02-15 DIAGNOSIS — Z882 Allergy status to sulfonamides status: Secondary | ICD-10-CM | POA: Diagnosis not present

## 2017-02-15 DIAGNOSIS — Z992 Dependence on renal dialysis: Secondary | ICD-10-CM

## 2017-02-15 DIAGNOSIS — I739 Peripheral vascular disease, unspecified: Secondary | ICD-10-CM | POA: Diagnosis present

## 2017-02-15 DIAGNOSIS — I132 Hypertensive heart and chronic kidney disease with heart failure and with stage 5 chronic kidney disease, or end stage renal disease: Secondary | ICD-10-CM | POA: Diagnosis present

## 2017-02-15 DIAGNOSIS — B1920 Unspecified viral hepatitis C without hepatic coma: Secondary | ICD-10-CM | POA: Diagnosis present

## 2017-02-15 DIAGNOSIS — Z79899 Other long term (current) drug therapy: Secondary | ICD-10-CM | POA: Diagnosis not present

## 2017-02-15 DIAGNOSIS — Z87891 Personal history of nicotine dependence: Secondary | ICD-10-CM

## 2017-02-15 DIAGNOSIS — J9621 Acute and chronic respiratory failure with hypoxia: Secondary | ICD-10-CM | POA: Diagnosis present

## 2017-02-15 DIAGNOSIS — I429 Cardiomyopathy, unspecified: Secondary | ICD-10-CM | POA: Diagnosis present

## 2017-02-15 DIAGNOSIS — E1151 Type 2 diabetes mellitus with diabetic peripheral angiopathy without gangrene: Secondary | ICD-10-CM | POA: Diagnosis present

## 2017-02-15 DIAGNOSIS — I248 Other forms of acute ischemic heart disease: Secondary | ICD-10-CM | POA: Diagnosis present

## 2017-02-15 DIAGNOSIS — E1122 Type 2 diabetes mellitus with diabetic chronic kidney disease: Secondary | ICD-10-CM | POA: Diagnosis present

## 2017-02-15 DIAGNOSIS — I5023 Acute on chronic systolic (congestive) heart failure: Secondary | ICD-10-CM | POA: Diagnosis not present

## 2017-02-15 DIAGNOSIS — J810 Acute pulmonary edema: Secondary | ICD-10-CM

## 2017-02-15 DIAGNOSIS — E872 Acidosis: Secondary | ICD-10-CM | POA: Diagnosis present

## 2017-02-15 DIAGNOSIS — I493 Ventricular premature depolarization: Secondary | ICD-10-CM | POA: Diagnosis not present

## 2017-02-15 HISTORY — DX: Type 2 diabetes mellitus without complications: E11.9

## 2017-02-15 LAB — TROPONIN I: TROPONIN I: 0.17 ng/mL — AB (ref <0.03)

## 2017-02-15 LAB — CBC
HCT: 38.1 % — ABNORMAL LOW (ref 40.0–52.0)
HEMOGLOBIN: 12.2 g/dL — AB (ref 13.0–18.0)
MCH: 30.6 pg (ref 26.0–34.0)
MCHC: 32.1 g/dL (ref 32.0–36.0)
MCV: 95.3 fL (ref 80.0–100.0)
Platelets: 158 10*3/uL (ref 150–440)
RBC: 3.99 MIL/uL — ABNORMAL LOW (ref 4.40–5.90)
RDW: 18.5 % — ABNORMAL HIGH (ref 11.5–14.5)
WBC: 7.3 10*3/uL (ref 3.8–10.6)

## 2017-02-15 LAB — BASIC METABOLIC PANEL
ANION GAP: 15 (ref 5–15)
BUN: 62 mg/dL — AB (ref 6–20)
CALCIUM: 8.7 mg/dL — AB (ref 8.9–10.3)
CO2: 18 mmol/L — AB (ref 22–32)
Chloride: 105 mmol/L (ref 101–111)
Creatinine, Ser: 7.69 mg/dL — ABNORMAL HIGH (ref 0.61–1.24)
GFR calc Af Amer: 8 mL/min — ABNORMAL LOW (ref >60)
GFR calc non Af Amer: 7 mL/min — ABNORMAL LOW (ref >60)
GLUCOSE: 118 mg/dL — AB (ref 65–99)
Potassium: 4.7 mmol/L (ref 3.5–5.1)
Sodium: 138 mmol/L (ref 135–145)

## 2017-02-15 LAB — GLUCOSE, CAPILLARY: Glucose-Capillary: 85 mg/dL (ref 65–99)

## 2017-02-15 LAB — MRSA PCR SCREENING: MRSA by PCR: NEGATIVE

## 2017-02-15 MED ORDER — ALBUTEROL SULFATE (2.5 MG/3ML) 0.083% IN NEBU: 2.5000 mg | INHALATION_SOLUTION | RESPIRATORY_TRACT | Status: DC | PRN

## 2017-02-15 MED ORDER — ACETAMINOPHEN 325 MG PO TABS: 650.0000 mg | ORAL_TABLET | Freq: Four times a day (QID) | ORAL | Status: DC | PRN

## 2017-02-15 MED ORDER — SENNOSIDES-DOCUSATE SODIUM 8.6-50 MG PO TABS: 1.0000 | ORAL_TABLET | Freq: Every evening | ORAL | Status: DC | PRN

## 2017-02-15 MED ORDER — ONDANSETRON HCL 4 MG/2ML IJ SOLN: 4.0000 mg | Freq: Four times a day (QID) | INTRAMUSCULAR | Status: DC | PRN

## 2017-02-15 MED ORDER — FUROSEMIDE 10 MG/ML IJ SOLN
40.0000 mg | Freq: Two times a day (BID) | INTRAMUSCULAR | Status: DC
  Administered 2017-02-16 (×2): 40 mg via INTRAVENOUS
  Filled 2017-02-15 (×2): qty 4

## 2017-02-15 MED ORDER — NITROGLYCERIN 2 % TD OINT
1.0000 [in_us] | TOPICAL_OINTMENT | Freq: Once | TRANSDERMAL | Status: AC
  Administered 2017-02-15: 1 [in_us] via TOPICAL
  Filled 2017-02-15: qty 1

## 2017-02-15 MED ORDER — SODIUM CHLORIDE 0.9% FLUSH
3.0000 mL | Freq: Two times a day (BID) | INTRAVENOUS | Status: DC
  Administered 2017-02-15 – 2017-02-17 (×5): 3 mL via INTRAVENOUS

## 2017-02-15 MED ORDER — BISACODYL 5 MG PO TBEC: 5.0000 mg | DELAYED_RELEASE_TABLET | Freq: Every day | ORAL | Status: DC | PRN

## 2017-02-15 MED ORDER — ONDANSETRON HCL 4 MG PO TABS: 4.0000 mg | ORAL_TABLET | Freq: Four times a day (QID) | ORAL | Status: DC | PRN

## 2017-02-15 MED ORDER — SODIUM CHLORIDE 0.9 % IV SOLN: 250.0000 mL | INTRAVENOUS | Status: DC | PRN

## 2017-02-15 MED ORDER — HEPARIN SODIUM (PORCINE) 5000 UNIT/ML IJ SOLN
5000.0000 [IU] | Freq: Three times a day (TID) | INTRAMUSCULAR | Status: DC
  Administered 2017-02-16 – 2017-02-18 (×6): 5000 [IU] via SUBCUTANEOUS
  Filled 2017-02-15 (×6): qty 1

## 2017-02-15 MED ORDER — ATORVASTATIN CALCIUM 20 MG PO TABS
40.0000 mg | ORAL_TABLET | Freq: Every day | ORAL | Status: DC
  Administered 2017-02-16: 40 mg via ORAL
  Filled 2017-02-15: qty 2

## 2017-02-15 MED ORDER — CALCIUM ACETATE (PHOS BINDER) 667 MG PO CAPS
1334.0000 mg | ORAL_CAPSULE | Freq: Three times a day (TID) | ORAL | Status: DC
  Administered 2017-02-16 – 2017-02-18 (×6): 1334 mg via ORAL
  Filled 2017-02-15 (×6): qty 2

## 2017-02-15 MED ORDER — NITROGLYCERIN 0.4 MG SL SUBL: 0.4000 mg | SUBLINGUAL_TABLET | SUBLINGUAL | Status: DC | PRN

## 2017-02-15 MED ORDER — HYDRALAZINE HCL 50 MG PO TABS
100.0000 mg | ORAL_TABLET | Freq: Three times a day (TID) | ORAL | Status: DC
  Administered 2017-02-16 – 2017-02-17 (×4): 100 mg via ORAL
  Filled 2017-02-15 (×4): qty 2

## 2017-02-15 MED ORDER — CINACALCET HCL 30 MG PO TABS
90.0000 mg | ORAL_TABLET | Freq: Every day | ORAL | Status: DC
  Administered 2017-02-16: 90 mg via ORAL
  Filled 2017-02-15 (×4): qty 3

## 2017-02-15 MED ORDER — CARVEDILOL 6.25 MG PO TABS
6.2500 mg | ORAL_TABLET | Freq: Two times a day (BID) | ORAL | Status: DC
  Administered 2017-02-16: 6.25 mg via ORAL
  Filled 2017-02-15: qty 1

## 2017-02-15 MED ORDER — ACETAMINOPHEN 650 MG RE SUPP: 650.0000 mg | Freq: Four times a day (QID) | RECTAL | Status: DC | PRN

## 2017-02-15 MED ORDER — SACUBITRIL-VALSARTAN 24-26 MG PO TABS
1.0000 | ORAL_TABLET | Freq: Two times a day (BID) | ORAL | Status: DC
  Administered 2017-02-16 (×2): 1 via ORAL
  Filled 2017-02-15 (×8): qty 1

## 2017-02-15 MED ORDER — SODIUM CHLORIDE 0.9% FLUSH: 3.0000 mL | INTRAVENOUS | Status: DC | PRN

## 2017-02-15 NOTE — Progress Notes (Signed)
Pt refusing mouth swabs at this time.   Discussed that while on the BIPAP there is increased risk for aspiration for large volumes of oral fluids.

## 2017-02-15 NOTE — Progress Notes (Signed)
Nitro patch removed from left chest per Dr Garen Lah order.

## 2017-02-15 NOTE — Progress Notes (Signed)
eLink Physician-Brief Progress Note Patient Name: [name] DOB: 09-29-1955 MRN: [id number]   Date of Service  02/15/2017  HPI/Events of Note  89 M with ESRD presenting with SOB x 2 days.  Admits to missing dialysis.    On camera check the patient is HD stable with HR of 84, BP of 146/125, RR of 16 and sats of 100%.  He is comfortable on BiPAP in no acute distress  eICU Interventions  Continue to orders as outlined by admitting MD Dialysis orders in place Continue to monitor via [facility] To be seen by PCCM     Intervention Category Evaluation Type: New Patient Evaluation  Kasee Hantz 02/15/2017, 9:42 PM

## 2017-02-15 NOTE — Progress Notes (Signed)
Pt notified of fluid restriction. Pt stated that "I don't care about the fluid restriction.  I drink what I want."  Discussed with Dr Juleen China.  Pt may drink as tolerated.

## 2017-02-15 NOTE — Progress Notes (Signed)
HD tx start 

## 2017-02-15 NOTE — Progress Notes (Signed)
Pre HD assessment  

## 2017-02-15 NOTE — ED Provider Notes (Signed)
[facility] Emergency Department Provider Note    First MD Initiated Contact with Patient 02/15/17 1916     (approximate)  I have reviewed the triage vital signs and the nursing notes.   HISTORY  Chief Complaint Shortness of Breath and Chest Pain    HPI [name] is a 61 y.o. 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  Patient states his been compliant with his home medications.  States that he cannot catch his breath.  Patient brought in by EMS was initially found to be in moderate respiratory distress.  States he has been having healthy blood-tinged cough.  Having severe pain with coughing.  Denies any fevers nausea or vomiting.  States he is roughly gained 10 pounds from his baseline of roughly 163.  Past Medical History:  Diagnosis Date  . Arthritis   . Chest pain    a. 09/2013 Myoview [facility]): basal inf defect more pronounced @ rest, likely artifact, EF 48%, low risk study.  . Chronic combined systolic and diastolic CHF (congestive heart failure) ([place])    a. 10/2013 Echo [facility]): EF 50%, diast dysfxn, triv AI/TR, mild MR;  b. 10/2014 Echo: EF 30-35%, diff HK, gr1 DD, mild AI, mild to mod MR/TR, mod dil LA, nl RV, mod to sev PAH;  c. 04/2016 Echo: EF 20-25%, sev dil LV, diff HK, gr3 DD, mod AI, MV syst bowing w/ prolapse, mod dil RV, mod TR.  [name] Dialysis patient ([place])    Mon, Wed, Fri  . Dyspnea   . ESRD (end stage renal disease) ([place])    a. MWF dialysis @ [facility].  . Hepatitis    Patient is unsure type of Hepatitis he has  . Hypertension   . Hypertensive heart disease with CHF (congestive heart failure) ([place])   . Peripheral vascular disease ([place])   . Pulmonary hypertension ([place]) 04/2016  . Renal  insufficiency    Family History  Problem Relation Age of Onset  . Hypertension Son   . Diabetes Son   . Hypertension Mother   . Diabetes Mother   . Diabetes Sister    Past Surgical History:  Procedure Laterality Date  . A/V FISTULAGRAM Left 04/06/2016   Procedure: A/V Fistulagram;  Surgeon: Katha Cabal, MD;  Location: [place] [facility];  Service: Cardiovascular;  Laterality: Left;  . A/V FISTULAGRAM Left 01/19/2017   Procedure: A/V FISTULAGRAM;  Surgeon: Katha Cabal, MD;  Location: [place] [facility];  Service: Cardiovascular;  Laterality: Left;  . A/V SHUNT INTERVENTION N/A 04/06/2016   Procedure: A/V Shunt Intervention;  Surgeon: Katha Cabal, MD;  Location: [place] [facility];  Service: Cardiovascular;  Laterality: N/A;  . A/V SHUNT INTERVENTION N/A 06/29/2016   Procedure: A/V Shunt Intervention;  Surgeon: Katha Cabal, MD;  Location: [place] [facility];  Service: Cardiovascular;  Laterality: N/A;  . AV FISTULA PLACEMENT Left 2014  . DIALYSIS/PERMA CATHETER INSERTION Right    Dr. Delana Meyer  . PERIPHERAL VASCULAR CATHETERIZATION N/A 10/03/2014   Procedure: A/V Shuntogram/Fistulagram;  Surgeon: Algernon Huxley, MD;  Location: [place] [facility];  Service: Cardiovascular;  Laterality: N/A;  . PERIPHERAL VASCULAR CATHETERIZATION Left 10/03/2014   Procedure: A/V Shunt Intervention;  Surgeon: Algernon Huxley, MD;  Location:  [place] [facility];  Service: Cardiovascular;  Laterality: Left;  . PERIPHERAL VASCULAR CATHETERIZATION Left 05/01/2015   Procedure: A/V Shuntogram/Fistulagram;  Surgeon: Algernon Huxley, MD;  Location: [place] [facility];  Service: Cardiovascular;  Laterality: Left;  . PERIPHERAL VASCULAR CATHETERIZATION N/A 05/01/2015   Procedure: A/V Shunt Intervention;  Surgeon: Algernon Huxley, MD;  Location: [place] [facility];  Service: Cardiovascular;  Laterality: N/A;  . UPPER EXTREMITY ANGIOGRAPHY Bilateral 06/29/2016   Procedure: Upper Extremity Angiography;   Surgeon: Katha Cabal, MD;  Location: [place] [facility];  Service: Cardiovascular;  Laterality: Bilateral;  . UPPER EXTREMITY INTERVENTION  06/29/2016   Procedure: Upper Extremity Intervention;  Surgeon: Katha Cabal, MD;  Location: [place] [facility];  Service: Cardiovascular;;   Patient Active Problem List   Diagnosis Date Noted  . Chronic systolic heart failure ([place]) 09/08/2016  . 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 Hyperkalemia 07/04/2016  . Abnormal EKG 07/04/2016  . Elevated troponin   . Chest pain 05/22/2016  . Sepsis ([place]) 02/26/2016  . HCAP (healthcare-associated pneumonia) 02/26/2016  . Diabetes ([place]) 02/26/2016  . Community acquired pneumonia 01/07/2016  . Renal dialysis device, implant, or graft complication 99/24/2683  . Tobacco use 09/04/2015  . ESRD (end stage renal disease) on dialysis ([place]) 08/02/2015  . HTN (hypertension) 08/02/2015  . Pulmonary edema 12/02/2014      Prior to Admission medications   Medication Sig Start Date End Date Taking? Authorizing Provider  acetaminophen (TYLENOL) 325 MG tablet Take 2 tablets (650 mg total) by mouth every 6 (six) hours as needed for mild pain (or Fever >/= 101). Patient taking differently: Take 500 mg by mouth every 6 (six) hours as needed for mild pain (or Fever >/= 101).  09/21/16   Gouru, Aruna, MD  albuterol (PROVENTIL HFA;VENTOLIN HFA) 108 (90 Base) MCG/ACT inhaler Inhale into the lungs every 4 (four) hours as needed for wheezing or shortness of breath.    [provider]  atorvastatin (LIPITOR) 40 MG tablet Take 1 tablet (40 mg total) by mouth daily. 10/26/16 01/24/17  Alisa Graff, FNP  calcium acetate (PHOSLO) 667 MG capsule Take 1,334 mg by mouth 3 (three) times daily with meals. Take 2001 mgs with meals 3 times daily and 1334 with snacks 12/11/15   [provider]  carvedilol (COREG) 6.25 MG tablet Take 1 tablet (6.25 mg total) by mouth 2 (two) times daily with a meal. 09/21/16   Gouru,  Aruna, MD  cinacalcet (SENSIPAR) 90 MG tablet Take 90 mg by mouth daily.    [provider]  hydrALAZINE (APRESOLINE) 50 MG tablet Take 100 mg by mouth 3 (three) times daily.  07/01/16   [provider]  ipratropium (ATROVENT HFA) 17 MCG/ACT inhaler Inhale 2 puffs into the lungs every 4 (four) hours as needed for wheezing.    [provider]  isosorbide mononitrate (IMDUR) 30 MG 24 hr tablet Take 1 tablet (30 mg total) by mouth daily. Patient not taking: Reported on 10/26/2016 08/24/16 11/22/16  Dustin Flock, MD  lidocaine-prilocaine (EMLA) cream Apply 1 application topically as needed (dialysis access).    [provider]  multivitamin (RENA-VIT) TABS tablet Take 1 tablet by mouth daily.    [provider]  nitroGLYCERIN (NITROSTAT) 0.4 MG SL tablet Place 0.4 mg under the tongue every 5 (five) minutes as needed for chest pain.    [provider]  sacubitril-valsartan (ENTRESTO) 24-26 MG Take 1 tablet by mouth 2 (two) times daily.  10/05/16   Alisa Graff, FNP  torsemide (DEMADEX) 20 MG tablet Take 1 tablet (20 mg total) by mouth daily. 09/07/16 10/26/16  Alisa Graff, FNP    Allergies Sulfa antibiotics    Social History Social History   Tobacco Use  . Smoking status: Former Smoker    Packs/day: 0.25    Years: 40.00    Pack years: 10.00    Types: Cigarettes    Last attempt to quit: 10/12/2016    Years since quitting: 0.3  . Smokeless tobacco: Never Used  Substance Use Topics  . Alcohol use: No    Alcohol/week: 0.0 oz  . Drug use: Yes    Frequency: 1.0 times per week    Types: Cocaine    Comment: 07/30/16 pt states last use 3-4 weeks ago    Review of Systems Patient denies headaches, rhinorrhea, blurry vision, numbness, shortness of breath, chest pain, edema, cough, abdominal pain, nausea, vomiting, diarrhea, dysuria, fevers, rashes or hallucinations unless otherwise stated above in  HPI. ____________________________________________   PHYSICAL EXAM:  VITAL SIGNS: Vitals:   02/15/17 1923  BP: (!) 168/75  Pulse: (!) 43  Resp: (!) 29  Temp: 98.3 F (36.8 C)  SpO2: 97%    Constitutional: Alert and oriented. Ill appearing in mod respiratory distress. Eyes: Conjunctivae are normal.  Head: Atraumatic. Nose: No congestion/rhinnorhea. Mouth/Throat: Mucous membranes are moist.   Neck: No stridor. Painless ROM.  Cardiovascular: Normal rate, regular rhythm. Grossly normal heart sounds.  Good peripheral circulation. Respiratory: Tachypnea, diminished breath sounds in bilateral bases.  Inspiratory crackles in anterior lung fields with occasional wheeze.  Gastrointestinal: Soft and nontender. No distention. No abdominal bruits. No CVA tenderness. Musculoskeletal: No lower extremity tenderness.  2+ BLE edema.  No joint effusions. Neurologic:  Normal speech and language. No gross focal neurologic deficits are appreciated. No facial droop Skin:  Skin is warm, dry and intact. No rash noted. Psychiatric: Mood and affect are normal. Speech and behavior are normal.  ____________________________________________   LABS (all labs ordered are listed, but only abnormal results are displayed)  Results for orders placed or performed during the hospital encounter of 02/15/17 (from the past 24 hour(s))  Basic metabolic panel     Status: Abnormal   Collection Time: 02/15/17  7:18 PM  Result Value Ref Range   Sodium 138 135 - 145 mmol/L   Potassium 4.7 3.5 - 5.1 mmol/L   Chloride 105 101 - 111 mmol/L   CO2 18 (L) 22 - 32 mmol/L   Glucose, Bld 118 (H) 65 - 99 mg/dL   BUN 62 (H) 6 - 20 mg/dL   Creatinine, Ser 7.69 (H) 0.61 - 1.24 mg/dL   Calcium 8.7 (L) 8.9 - 10.3 mg/dL   GFR calc non Af Amer 7 (L) >60 mL/min   GFR calc Af Amer 8 (L) >60 mL/min   Anion gap 15 5 - 15  CBC     Status: Abnormal   Collection Time: 02/15/17  7:18 PM  Result Value Ref Range   WBC 7.3 3.8 - 10.6 K/uL    RBC 3.99 (L) 4.40 - 5.90 MIL/uL   Hemoglobin 12.2 (L) 13.0 - 18.0 g/dL   HCT 38.1 (L) 40.0 - 52.0 %   MCV 95.3 80.0 - 100.0 fL   MCH 30.6 26.0 - 34.0 pg   MCHC 32.1 32.0 - 36.0 g/dL   RDW 18.5 (H) 11.5 - 14.5 %   Platelets 158 150 - 440 K/uL  Troponin I  Status: Abnormal   Collection Time: 02/15/17  7:18 PM  Result Value Ref Range   Troponin I 0.17 (HH) <0.03 ng/mL   ____________________________________________  EKG My review and personal interpretation at Time: 19:19   Indication: sob  Rate: 95  Rhythm: sinus Axis: left Other: frequent pvcs, non specific st changes, no stemi ____________________________________________  RADIOLOGY  I personally reviewed all radiographic images ordered to evaluate for the above acute complaints and reviewed radiology reports and findings.  These findings were personally discussed with the patient.  Please see medical record for radiology report.  ____________________________________________   PROCEDURES  Procedure(s) performed:  .Critical Care Performed by: Merlyn Lot, MD Authorized by: Merlyn Lot, MD   Critical care provider statement:    Critical care time (minutes):  35   Critical care time was exclusive of:  Separately billable procedures and treating other patients   Critical care was necessary to treat or prevent imminent or life-threatening deterioration of the following conditions:  Respiratory failure   Critical care was time spent personally by me on the following activities:  Development of treatment plan with patient or surrogate, discussions with consultants, evaluation of patient's response to treatment, examination of patient, obtaining history from patient or surrogate, ordering and performing treatments and interventions, ordering and review of laboratory studies, ordering and review of radiographic studies, pulse oximetry, re-evaluation of patient's condition and review of old charts      Critical Care  performed: no ____________________________________________   INITIAL IMPRESSION / [place] / ED COURSE  Pertinent labs & imaging results that were available during my care of the patient were reviewed by me and considered in my medical decision making (see chart for details).  DDX: Asthma, copd, CHF, pna, ptx, malignancy, Pe, anemia   [name] is a 61 y.o. who presents to the ED with acute respiratory distress as described above.  No hypoxia on 3 L nasal cannula.  Patient is with labored breathing with diminished breath sounds concerning for flash pulmonary edema.  Chest x-ray consistent with this process.  Fortunately his diastolic pressures are stable at this time but patient with significant combined congestive heart failure.  Patient placed on BiPAP for respiratory support.  Troponin is elevated but likely demand ischemia and difficulty to interpret in the setting of chronic dialysis.  EKG with frequent PVCs.  Will need to trend troponins.  Patient also with metabolic acidosis likely secondary to renal failure.  No evidence of infectious process.  Spoke with Dr. Juleen China of renal service who kindly agrees to coordinate urgent dialysis.  Spoke with hospitalist who kindly agrees to admit patient for further medical management.      ____________________________________________   FINAL CLINICAL IMPRESSION(S) / ED DIAGNOSES  Final diagnoses:  Acute pulmonary edema ([place])  ESRD (end stage renal disease) on dialysis ([place])      NEW MEDICATIONS STARTED DURING THIS VISIT:  This SmartLink is deprecated. Use AVSMEDLIST instead to display the medication list for a patient.   Note:  This document was prepared using Dragon voice recognition software and may include unintentional dictation errors.    Merlyn Lot, MD 02/15/17 [id number]

## 2017-02-15 NOTE — H&P (Addendum)
[place] [facility] [place] NAME: [name]    MR#:  [id number]  DATE OF BIRTH:  23-Jan-1956  DATE OF ADMISSION:  02/15/2017  PRIMARY CARE PHYSICIAN: [facility] [place]   REQUESTING/REFERRING PHYSICIAN: Merlyn Lot, MD  CHIEF COMPLAINT:   Chief Complaint  Patient presents with  . Shortness of Breath  . Chest Pain   Shortness of breath and cough with sputum 2 days HISTORY OF PRESENT ILLNESS:  [name]  is a 61 y.o. male with a known history of multiple medical problems as below.  The patient presented to the ED with worsening shortness of breath, cough with sputum and chest discomfort for 2 days.  He was found hypoxia in the ED with respiratory distress, put on BiPAP.  Chest x-ray show pulmonary edema.  He gained 10 pounds from his baseline.  ED physician Dr. Quentin Cornwall discussed with on-call nephrologist, who planned emergent hemodialysis today.  PAST MEDICAL HISTORY:   Past Medical History:  Diagnosis Date  . Arthritis   . Chest pain    a. 09/2013 Myoview [facility]): basal inf defect more pronounced @ rest, likely artifact, EF 48%, low risk study.  . Chronic combined systolic and diastolic CHF (congestive heart failure) ([place])    a. 10/2013 Echo [facility]): EF 50%, diast dysfxn, triv AI/TR, mild MR;  b. 10/2014 Echo: EF 30-35%, diff HK, gr1 DD, mild AI, mild to mod MR/TR, mod dil LA, nl RV, mod to sev PAH;  c. 04/2016 Echo: EF 20-25%, sev dil LV, diff HK, gr3 DD, mod AI, MV syst bowing w/ prolapse, mod dil RV, mod TR.  [name] Dialysis patient ([place])    Mon, Wed, Fri  . Dyspnea   . ESRD (end stage renal disease) ([place])    a. MWF dialysis @ [facility].  . Hepatitis    Patient is unsure type of Hepatitis he has  . Hypertension   . Hypertensive heart disease with CHF (congestive heart failure) ([place])   . Peripheral vascular disease ([place])   . Pulmonary hypertension ([place]) 04/2016  . Renal insufficiency     PAST SURGICAL  HISTORY:   Past Surgical History:  Procedure Laterality Date  . A/V FISTULAGRAM Left 04/06/2016   Procedure: A/V Fistulagram;  Surgeon: Katha Cabal, MD;  Location: [place] CV LAB;  Service: Cardiovascular;  Laterality: Left;  . A/V FISTULAGRAM Left 01/19/2017   Procedure: A/V FISTULAGRAM;  Surgeon: Katha Cabal, MD;  Location: [place] CV LAB;  Service: Cardiovascular;  Laterality: Left;  . A/V SHUNT INTERVENTION N/A 04/06/2016   Procedure: A/V Shunt Intervention;  Surgeon: Katha Cabal, MD;  Location: [place] CV LAB;  Service: Cardiovascular;  Laterality: N/A;  . A/V SHUNT INTERVENTION N/A 06/29/2016   Procedure: A/V Shunt Intervention;  Surgeon: Katha Cabal, MD;  Location: [place] CV LAB;  Service: Cardiovascular;  Laterality: N/A;  . AV FISTULA PLACEMENT Left 2014  . DIALYSIS/PERMA CATHETER INSERTION Right    Dr. Delana Meyer  . PERIPHERAL VASCULAR CATHETERIZATION N/A 10/03/2014   Procedure: A/V Shuntogram/Fistulagram;  Surgeon: Algernon Huxley, MD;  Location: [place] CV LAB;  Service: Cardiovascular;  Laterality: N/A;  . PERIPHERAL VASCULAR CATHETERIZATION Left 10/03/2014   Procedure: A/V Shunt Intervention;  Surgeon: Algernon Huxley, MD;  Location: [place] CV LAB;  Service: Cardiovascular;  Laterality: Left;  . PERIPHERAL VASCULAR CATHETERIZATION Left 05/01/2015   Procedure: A/V Shuntogram/Fistulagram;  Surgeon: Algernon Huxley, MD;  Location: [facility]  INVASIVE CV LAB;  Service: Cardiovascular;  Laterality: Left;  . PERIPHERAL VASCULAR CATHETERIZATION N/A 05/01/2015   Procedure: A/V Shunt Intervention;  Surgeon: Algernon Huxley, MD;  Location: [place] CV LAB;  Service: Cardiovascular;  Laterality: N/A;  . UPPER EXTREMITY ANGIOGRAPHY Bilateral 06/29/2016   Procedure: Upper Extremity Angiography;  Surgeon: Katha Cabal, MD;  Location: [place] CV LAB;  Service: Cardiovascular;  Laterality: Bilateral;  . UPPER EXTREMITY INTERVENTION  06/29/2016    Procedure: Upper Extremity Intervention;  Surgeon: Katha Cabal, MD;  Location: [place] CV LAB;  Service: Cardiovascular;;    SOCIAL HISTORY:   Social History   Tobacco Use  . Smoking status: Former Smoker    Packs/day: 0.25    Years: 40.00    Pack years: 10.00    Types: Cigarettes    Last attempt to quit: 10/12/2016    Years since quitting: 0.3  . Smokeless tobacco: Never Used  Substance Use Topics  . Alcohol use: No    Alcohol/week: 0.0 oz    FAMILY HISTORY:   Family History  Problem Relation Age of Onset  . Hypertension Son   . Diabetes Son   . Hypertension Mother   . Diabetes Mother   . Diabetes Sister     DRUG ALLERGIES:   Allergies  Allergen Reactions  . Sulfa Antibiotics Rash    REVIEW OF SYSTEMS:   Review of Systems  Constitutional: Positive for malaise/fatigue. Negative for chills and fever.  HENT: Negative for sore throat.   Eyes: Negative for blurred vision and double vision.  Respiratory: Positive for cough, sputum production and shortness of breath. Negative for hemoptysis, wheezing and stridor.   Cardiovascular: Negative for chest pain, palpitations, orthopnea and leg swelling.  Gastrointestinal: Negative for abdominal pain, blood in stool, diarrhea, melena, nausea and vomiting.  Genitourinary: Negative for dysuria, flank pain and hematuria.  Musculoskeletal: Negative for back pain and joint pain.  Neurological: Positive for weakness. Negative for dizziness, sensory change, focal weakness, seizures, loss of consciousness and headaches.  Endo/Heme/Allergies: Negative for polydipsia.  Psychiatric/Behavioral: Negative for depression. The patient is not nervous/anxious.     MEDICATIONS AT HOME:   Prior to Admission medications   Medication Sig Start Date End Date Taking? Authorizing Provider  acetaminophen (TYLENOL) 325 MG tablet Take 2 tablets (650 mg total) by mouth every 6 (six) hours as needed for mild pain (or Fever >/=  101). Patient taking differently: Take 500 mg by mouth every 6 (six) hours as needed for mild pain (or Fever >/= 101).  09/21/16   Gouru, Aruna, MD  albuterol (PROVENTIL HFA;VENTOLIN HFA) 108 (90 Base) MCG/ACT inhaler Inhale into the lungs every 4 (four) hours as needed for wheezing or shortness of breath.    [provider]  atorvastatin (LIPITOR) 40 MG tablet Take 1 tablet (40 mg total) by mouth daily. 10/26/16 01/24/17  Alisa Graff, FNP  calcium acetate (PHOSLO) 667 MG capsule Take 1,334 mg by mouth 3 (three) times daily with meals. Take 2001 mgs with meals 3 times daily and 1334 with snacks 12/11/15   [provider]  carvedilol (COREG) 6.25 MG tablet Take 1 tablet (6.25 mg total) by mouth 2 (two) times daily with a meal. 09/21/16   Gouru, Aruna, MD  cinacalcet (SENSIPAR) 90 MG tablet Take 90 mg by mouth daily.    [provider]  hydrALAZINE (APRESOLINE) 50 MG tablet Take 100 mg by mouth 3 (three) times daily.  07/01/16   [provider]  ipratropium (ATROVENT HFA) 17 MCG/ACT inhaler Inhale 2 puffs into the lungs every 4 (four) hours as needed for wheezing.    [provider]  isosorbide mononitrate (IMDUR) 30 MG 24 hr tablet Take 1 tablet (30 mg total) by mouth daily. Patient not taking: Reported on 10/26/2016 08/24/16 11/22/16  Dustin Flock, MD  lidocaine-prilocaine (EMLA) cream Apply 1 application topically as needed (dialysis access).    [provider]  multivitamin (RENA-VIT) TABS tablet Take 1 tablet by mouth daily.    [provider]  nitroGLYCERIN (NITROSTAT) 0.4 MG SL tablet Place 0.4 mg under the tongue every 5 (five) minutes as needed for chest pain.    [provider]  sacubitril-valsartan (ENTRESTO) 24-26 MG Take 1 tablet by mouth 2 (two) times daily. 10/05/16   Alisa Graff, FNP  torsemide (DEMADEX) 20 MG tablet Take 1 tablet (20 mg total) by mouth daily. 09/07/16 10/26/16  Alisa Graff, FNP      VITAL SIGNS:   Blood pressure (!) 168/75, pulse (!) 43, temperature 98.3 F (36.8 C), temperature source Oral, resp. rate (!) 29, height 5\' 8"  (1.727 m), weight 173 lb (78.5 kg), SpO2 97 %.  PHYSICAL EXAMINATION:  Physical Exam  GENERAL:  61 y.o.-year-old patient lying in the bed with no acute distress.  EYES: Pupils equal, round, reactive to light and accommodation. No scleral icterus. Extraocular muscles intact.  HEENT: Head atraumatic, normocephalic. Oropharynx and nasopharynx clear.  JVD positive. NECK:  Supple, no jugular venous distention. No thyroid enlargement, no tenderness.  LUNGS: Normal breath sounds bilaterally, no wheezing, bilateral rales. With use of accessory muscles of respiration.  CARDIOVASCULAR: S1, S2 normal. No murmurs, rubs, or gallops.  ABDOMEN: Soft, nontender, nondistended. Bowel sounds present. No organomegaly or mass.  EXTREMITIES: No pedal edema, cyanosis, or clubbing.  NEUROLOGIC: Cranial nerves II through XII are intact. Muscle strength 5/5 in all extremities. Sensation intact. Gait not checked.  PSYCHIATRIC: The patient is alert and oriented x 3.  SKIN: No obvious rash, lesion, or ulcer.   LABORATORY PANEL:   CBC Recent Labs  Lab 02/15/17 1918  WBC 7.3  HGB 12.2*  HCT 38.1*  PLT 158   ------------------------------------------------------------------------------------------------------------------  Chemistries  Recent Labs  Lab 02/15/17 1918  NA 138  K 4.7  CL 105  CO2 18*  GLUCOSE 118*  BUN 62*  CREATININE 7.69*  CALCIUM 8.7*   ------------------------------------------------------------------------------------------------------------------  Cardiac Enzymes Recent Labs  Lab 02/15/17 1918  TROPONINI 0.17*   ------------------------------------------------------------------------------------------------------------------  RADIOLOGY:  Dg Chest Portable 1 View  Result Date: 02/15/2017 CLINICAL DATA:  Shortness of breath, chest pain. EXAM:  PORTABLE CHEST 1 VIEW COMPARISON:  Radiographs of November 20, 2016. FINDINGS: Stable cardiomegaly and central pulmonary vascular congestion is noted. Aortic atherosclerosis is noted. Stable position of right internal jugular catheter. No pneumothorax or pleural effusion is noted. Both lungs are clear. The visualized skeletal structures are unremarkable. IMPRESSION: Stable cardiomegaly and central pulmonary vascular congestion. Aortic atherosclerosis. Electronically Signed   By: Marijo Conception, M.D.   On: 02/15/2017 19:34      IMPRESSION AND PLAN:   Acute respiratory failure with hypoxia due to fluid overload and acute on chronic systolic and diastolic CHF ejection fraction 25%.  The patient will be admitted to stepdown unit. The patient will get emergent hemodialysis today.  Continue BiPAP, start Lasix twice daily, continue Entresto, DuoNeb every 6 hours, intensivist consult.  Elevated troponin, due to demanding ischemia due to above.  Follow-up troponin level.  ESRD.  Continue hemodialysis.  Hypertension.  Continue home hypertension medication.  Discussed with the on-call [place] intensivist. All the records are reviewed and case discussed with ED provider. Management plans discussed with the patient, family and they are in agreement.  CODE STATUS: Full code  TOTAL CRITICAL TIME TAKING CARE OF THIS PATIENT: 58 minutes.    Demetrios Loll M.D on 02/15/2017 at 8:23 PM  Between 7am to 6pm - Pager - [phone]  After 6pm go to [email or web address] - Technical brewer [facility]  Office  [phone]  CC: Primary care physician; [facility] [place]   Note: This dictation was prepared with Diplomatic Services operational officer dictation along with smaller phrase technology. Any transcriptional errors that result from this process are unin

## 2017-02-15 NOTE — Progress Notes (Signed)
At scheduled BiPAP check pt insisted on removing mask. This RT requested to wait until after dialysis is complete pt stated that he wasn't waiting another 4 hrs and started removing mask. This RT requested pt to wait until I could obtain a nasal cannula. Pt placed on 5Lnc Sat 99% HR 79 RR 28 will continue to monitor

## 2017-02-15 NOTE — ED Triage Notes (Signed)
Pt arrived via ems from home with complaints of shortness of breath and chest pain for the last few days. Pt receives dialysis treatment MWD. Pt went to dialysis on Sunday instead of Monday this week. Upon arrival to the ED pt is alert and oriented and breathing is labored. Pt does report chest pain when coughing.

## 2017-02-15 NOTE — Progress Notes (Signed)
[facility]  ROUNDING NOTE   Subjective:   [name] admitted to [facility] ICU on 02/15/2017 for Acute pulmonary edema ([place]) [J81.0] ESRD (end stage renal disease) on dialysis ([place]) [N18.6, Z99.2]  Patient is about 4 kg above his estimated dry weight.   Objective:  Vital signs in last 24 hours:  Temp:  [98.3 F (36.8 C)-98.7 F (37.1 C)] 98.7 F (37.1 C) (12/25 2127) Pulse Rate:  [37-83] 37 (12/25 2200) Resp:  [17-29] 27 (12/25 2200) BP: (142-170)/(75-95) 170/89 (12/25 2200) SpO2:  [97 %-100 %] 100 % (12/25 2127) Weight:  [77.8 kg (171 lb 8.3 oz)-78.5 kg (173 lb)] 77.8 kg (171 lb 8.3 oz) (12/25 2127)  Weight change:  Filed Weights   02/15/17 1920 02/15/17 2127  Weight: 78.5 kg (173 lb) 77.8 kg (171 lb 8.3 oz)    Intake/Output: No intake/output data recorded.   Intake/Output this shift:  No intake/output data recorded.  Physical Exam: General: Critically  Head: BIPAP  Eyes: Anicteric, PERRL  Neck: Supple, trachea midline  Lungs:  Bilateral crackles, +BIPAP  Heart: Regular rate and rhythm  Abdomen:  Soft, nontender,   Extremities: Trace peripheral edema.  Neurologic: Nonfocal, moving all four extremities  Skin: No lesions  Access: RIJ permcath, left AVF anneurysmal     Basic Metabolic Panel: Recent Labs  Lab 02/15/17 1918  NA 138  K 4.7  CL 105  CO2 18*  GLUCOSE 118*  BUN 62*  CREATININE 7.69*  CALCIUM 8.7*    Liver Function Tests: No results for input(s): AST, ALT, ALKPHOS, BILITOT, PROT, ALBUMIN in the last 168 hours. No results for input(s): LIPASE, AMYLASE in the last 168 hours. No results for input(s): AMMONIA in the last 168 hours.  CBC: Recent Labs  Lab 02/15/17 1918  WBC 7.3  HGB 12.2*  HCT 38.1*  MCV 95.3  PLT 158    Cardiac Enzymes: Recent Labs  Lab 02/15/17 1918  TROPONINI 0.17*    BNP: Invalid input(s): POCBNP  CBG: Recent Labs  Lab 02/15/17 2134  GLUCAP 30    Microbiology: Results for orders  placed or performed during the hospital encounter of 09/19/16  MRSA PCR Screening     Status: None   Collection Time: 09/20/16  3:45 AM  Result Value Ref Range Status   MRSA by PCR NEGATIVE NEGATIVE Final    Comment:        The GeneXpert MRSA Assay (FDA approved for NASAL specimens only), is one component of a comprehensive MRSA colonization surveillance program. It is not intended to diagnose MRSA infection nor to guide or monitor treatment for MRSA infections.     Coagulation Studies: No results for input(s): LABPROT, INR in the last 72 hours.  Urinalysis: No results for input(s): COLORURINE, LABSPEC, PHURINE, GLUCOSEU, HGBUR, BILIRUBINUR, KETONESUR, PROTEINUR, UROBILINOGEN, NITRITE, LEUKOCYTESUR in the last 72 hours.  Invalid input(s): APPERANCEUR    Imaging: Dg Chest Portable 1 View  Result Date: 02/15/2017 CLINICAL DATA:  Shortness of breath, chest pain. EXAM: PORTABLE CHEST 1 VIEW COMPARISON:  Radiographs of November 20, 2016. FINDINGS: Stable cardiomegaly and central pulmonary vascular congestion is noted. Aortic atherosclerosis is noted. Stable position of right internal jugular catheter. No pneumothorax or pleural effusion is noted. Both lungs are clear. The visualized skeletal structures are unremarkable. IMPRESSION: Stable cardiomegaly and central pulmonary vascular congestion. Aortic atherosclerosis. Electronically Signed   By: Marijo Conception, M.D.   On: 02/15/2017 19:34     Medications:   . sodium chloride     .  atorvastatin  40 mg Oral Daily  . [START ON 02/16/2017] calcium acetate  1,334 mg Oral TID WC  . [START ON 02/16/2017] carvedilol  6.25 mg Oral BID WC  . cinacalcet  90 mg Oral Daily  . furosemide  40 mg Intravenous Q12H  . heparin  5,000 Units Subcutaneous Q8H  . hydrALAZINE  100 mg Oral TID  . sacubitril-valsartan  1 tablet Oral BID  . sodium chloride flush  3 mL Intravenous Q12H   sodium chloride, acetaminophen **OR** acetaminophen,  albuterol, bisacodyl, nitroGLYCERIN, ondansetron **OR** ondansetron (ZOFRAN) IV, senna-docusate, sodium chloride flush  Assessment/ Plan:  [name] is a 61 y.o. black male with end stage renal disease on hemodialysis, hypertension, hepatitis C, anemia, seizure disorder, COPD/tobacco abuse, history of substance abuse,  secondary hyperparathyroidism  [facility] MWF [place] 74kg  1.  End-stage renal disease: with volume overload and flash pulmonary edema. Placed on BIPAP and admitted to ICU.  With metabolic acidosis.  - Emergent hemodialysis tonight. Orders prepared.   2. Hypertension: placed on nitro paste. Concern for acute systolic exacerbation of congestive heart failure. Echo from 04/30/16 with EF of 20-25%.  - agree with IV furosemide - resume home medications.   3. Anemia of chronic kidney disease: hemoglobin 12.2 - epo as outpatient.   4. Secondary Hyperparathyroidism: with hyperphosphatemia. Outpatient phosphorus 7.4, calcium 8 and PTH 220 - cinalcet - calcium acetate with meals.    LOS: 0 [name] 12/25/201810:15 PM

## 2017-02-16 ENCOUNTER — Inpatient Hospital Stay: Payer: Medicare Other

## 2017-02-16 ENCOUNTER — Other Ambulatory Visit: Payer: Self-pay

## 2017-02-16 LAB — BASIC METABOLIC PANEL
Anion gap: 9 (ref 5–15)
BUN: 22 mg/dL — AB (ref 6–20)
CHLORIDE: 100 mmol/L — AB (ref 101–111)
CO2: 30 mmol/L (ref 22–32)
CREATININE: 3.31 mg/dL — AB (ref 0.61–1.24)
Calcium: 8.5 mg/dL — ABNORMAL LOW (ref 8.9–10.3)
GFR, EST AFRICAN AMERICAN: 21 mL/min — AB (ref >60)
GFR, EST NON AFRICAN AMERICAN: 19 mL/min — AB (ref >60)
Glucose, Bld: 65 mg/dL (ref 65–99)
Potassium: 3 mmol/L — ABNORMAL LOW (ref 3.5–5.1)
SODIUM: 139 mmol/L (ref 135–145)

## 2017-02-16 LAB — TROPONIN I
TROPONIN I: 0.18 ng/mL — AB (ref <0.03)
TROPONIN I: 0.19 ng/mL — AB (ref <0.03)

## 2017-02-16 MED ORDER — DIPHENHYDRAMINE HCL 50 MG/ML IJ SOLN
INTRAMUSCULAR | Status: AC
  Administered 2017-02-16: 25 mg via INTRAVENOUS
  Filled 2017-02-16: qty 1

## 2017-02-16 MED ORDER — GUAIFENESIN-CODEINE 100-10 MG/5ML PO SOLN
10.0000 mL | ORAL | Status: DC | PRN
  Administered 2017-02-16 – 2017-02-17 (×6): 10 mL via ORAL
  Filled 2017-02-16 (×6): qty 10

## 2017-02-16 MED ORDER — OFLOXACIN 0.3 % OP SOLN
1.0000 [drp] | Freq: Four times a day (QID) | OPHTHALMIC | Status: DC
  Administered 2017-02-16 – 2017-02-17 (×5): 1 [drp] via OPHTHALMIC
  Filled 2017-02-16: qty 5

## 2017-02-16 MED ORDER — BENZONATATE 100 MG PO CAPS
200.0000 mg | ORAL_CAPSULE | Freq: Three times a day (TID) | ORAL | Status: DC | PRN
  Administered 2017-02-16: 200 mg via ORAL
  Filled 2017-02-16: qty 2

## 2017-02-16 MED ORDER — DIPHENHYDRAMINE HCL 50 MG/ML IJ SOLN
25.0000 mg | Freq: Every day | INTRAMUSCULAR | Status: DC | PRN
  Administered 2017-02-16 – 2017-02-18 (×2): 25 mg via INTRAVENOUS
  Filled 2017-02-16: qty 1

## 2017-02-16 MED ORDER — POTASSIUM CHLORIDE CRYS ER 20 MEQ PO TBCR
60.0000 meq | EXTENDED_RELEASE_TABLET | Freq: Once | ORAL | Status: AC
  Administered 2017-02-16: 60 meq via ORAL
  Filled 2017-02-16: qty 3

## 2017-02-16 MED ORDER — DIPHENHYDRAMINE HCL 50 MG PO CAPS: 50.0000 mg | ORAL_CAPSULE | Freq: Every day | ORAL | Status: DC | PRN

## 2017-02-16 NOTE — Progress Notes (Signed)
Pre HD assessment  

## 2017-02-16 NOTE — Progress Notes (Signed)
Post HD assessment  

## 2017-02-16 NOTE — Progress Notes (Signed)
Refused bed alarm.  

## 2017-02-16 NOTE — Consult Note (Signed)
PULMONARY / CRITICAL CARE MEDICINE   Name: [name] MRN: [id number] DOB: 1955/03/24    ADMISSION DATE:  02/15/2017 CONSULTATION DATE:  02/15/17  REFERRING MD: Dr. Bridgett Larsson  Reason: Acute respiratory failure secondary to volume overload  HISTORY OF PRESENT ILLNESS:   This is 61 year old African-American male with a history of end-stage renal disease on hemodialysis Monday Wednesday Friday who presented to the ED with worsening shortness of breath.  Patient had hemodialysis on Sunday.  He reports worsening shortness of breath that has gotten worse over the course of 2 days.  His ED workup showed a mildly elevated troponin and pulmonary edema.  He has been admitted to the ICU for emergent hemodialysis.  PAST MEDICAL HISTORY :  He  has a past medical history of Arthritis, Chest pain, Chronic combined systolic and diastolic CHF (congestive heart failure) ([place]), Diabetes ([place]), Dialysis patient ([place]), Dyspnea, ESRD (end stage renal disease) ([place]), Hepatitis, Hypertension, Hypertensive heart disease with CHF (congestive heart failure) ([place]), Peripheral vascular disease ([place]), Pulmonary hypertension ([place]) (04/2016), and Renal insufficiency.  PAST SURGICAL HISTORY: He  has a past surgical history that includes AV fistula placement (Left, 2014); Cardiac catheterization (N/A, 10/03/2014); Cardiac catheterization (Left, 10/03/2014); Cardiac catheterization (Left, 05/01/2015); Cardiac catheterization (N/A, 05/01/2015); A/V Fistulagram (Left, 04/06/2016); A/V SHUNT INTERVENTION (N/A, 04/06/2016); Upper Extremity Angiography (Bilateral, 06/29/2016); UPPER EXTREMITY INTERVENTION (06/29/2016); A/V SHUNT INTERVENTION (N/A, 06/29/2016); DIALYSIS/PERMA CATHETER INSERTION (Right); and A/V Fistulagram (Left, 01/19/2017).  Allergies  Allergen Reactions  . Sulfa Antibiotics Rash    No current facility-administered medications on file prior to encounter.    Current Outpatient Medications on File Prior to Encounter   Medication Sig  . acetaminophen (TYLENOL) 325 MG tablet Take 2 tablets (650 mg total) by mouth every 6 (six) hours as needed for mild pain (or Fever >/= 101). (Patient taking differently: Take 500 mg by mouth every 6 (six) hours as needed for mild pain (or Fever >/= 101). )  . albuterol (PROVENTIL HFA;VENTOLIN HFA) 108 (90 Base) MCG/ACT inhaler Inhale into the lungs every 4 (four) hours as needed for wheezing or shortness of breath.  [name] atorvastatin (LIPITOR) 40 MG tablet Take 1 tablet (40 mg total) by mouth daily.  . calcium acetate (PHOSLO) 667 MG capsule Take 1,334 mg by mouth 3 (three) times daily with meals. Take 2001 mgs with meals 3 times daily and 1334 with snacks  . carvedilol (COREG) 6.25 MG tablet Take 1 tablet (6.25 mg total) by mouth 2 (two) times daily with a meal.  . cinacalcet (SENSIPAR) 90 MG tablet Take 90 mg by mouth daily.  . hydrALAZINE (APRESOLINE) 50 MG tablet Take 100 mg by mouth 3 (three) times daily.   [name] ipratropium (ATROVENT HFA) 17 MCG/ACT inhaler Inhale 2 puffs into the lungs every 4 (four) hours as needed for wheezing.  . isosorbide mononitrate (IMDUR) 30 MG 24 hr tablet Take 1 tablet (30 mg total) by mouth daily. (Patient not taking: Reported on 10/26/2016)  . lidocaine-prilocaine (EMLA) cream Apply 1 application topically as needed (dialysis access).  . multivitamin (RENA-VIT) TABS tablet Take 1 tablet by mouth daily.  . nitroGLYCERIN (NITROSTAT) 0.4 MG SL tablet Place 0.4 mg under the tongue every 5 (five) minutes as needed for chest pain.  . sacubitril-valsartan (ENTRESTO) 24-26 MG Take 1 tablet by mouth 2 (two) times daily.  [name] torsemide (DEMADEX) 20 MG tablet Take 1 tablet (20 mg total) by mouth daily.    FAMILY HISTORY:  His indicated that his mother is deceased. He indicated  that his father is deceased. He indicated that his sister is alive. He indicated that both of his brothers are alive. He indicated that his son is alive.   SOCIAL HISTORY: He  reports  that he quit smoking about 4 months ago. His smoking use included cigarettes. He has a 10.00 pack-year smoking history. he has never used smokeless tobacco. He reports that he uses drugs. Drug: Cocaine. Frequency: 1.00 time per week. He reports that he does not drink alcohol.  REVIEW OF SYSTEMS:   All systems reviewed.  Pertinent positives include dyspnea and mild left substernal chest pain.  All other systems are negative  SUBJECTIVE:   VITAL SIGNS: BP (!) 143/90   Pulse 78   Temp 99.1 F (37.3 C) (Oral)   Resp (!) 35   Ht 5\' 8"  (1.727 m)   Wt 74.2 kg (163 lb 9.3 oz)   SpO2 95%   BMI 24.87 kg/m   HEMODYNAMICS:    VENTILATOR SETTINGS: FiO2 (%):  [30 %] 30 %  INTAKE / OUTPUT: I/O last 3 completed shifts: In: 0  Out: 3652 [[id number]]  PHYSICAL EXAMINATION: General:  NAD Neuro:  CN intact HEENT: PERRLA, mild JVD Cardiovascular:  RRR Lungs: BL breath sounds, bibasilar crackes Abdomen:  BS X4 Musculoskeletal:  +ROM Skin:  Warm and dry  LABS:  BMET Recent Labs  Lab 02/15/17 1918 02/16/17 0415  NA 138 139  K 4.7 3.0*  CL 105 100*  CO2 18* 30  BUN 62* 22*  CREATININE 7.69* 3.31*  GLUCOSE 118* 65    Electrolytes Recent Labs  Lab 02/15/17 1918 02/16/17 0415  CALCIUM 8.7* 8.5*    CBC Recent Labs  Lab 02/15/17 1918  WBC 7.3  HGB 12.2*  HCT 38.1*  PLT 158    Coag's No results for input(s): APTT, INR in the last 168 hours.  Sepsis Markers No results for input(s): LATICACIDVEN, PROCALCITON, O2SATVEN in the last 168 hours.  ABG No results for input(s): PHART, PCO2ART, PO2ART in the last 168 hours.  Liver Enzymes No results for input(s): AST, ALT, ALKPHOS, BILITOT, ALBUMIN in the last 168 hours.  Cardiac Enzymes Recent Labs  Lab 02/15/17 1918 02/15/17 2353 02/16/17 0415  TROPONINI 0.17* 0.18* 0.19*    Glucose Recent Labs  Lab 02/15/17 2134  GLUCAP 85    Imaging Dg Chest Portable 1 View  Result Date: 02/15/2017 CLINICAL DATA:   Shortness of breath, chest pain. EXAM: PORTABLE CHEST 1 VIEW COMPARISON:  Radiographs of November 20, 2016. FINDINGS: Stable cardiomegaly and central pulmonary vascular congestion is noted. Aortic atherosclerosis is noted. Stable position of right internal jugular catheter. No pneumothorax or pleural effusion is noted. Both lungs are clear. The visualized skeletal structures are unremarkable. IMPRESSION: Stable cardiomegaly and central pulmonary vascular congestion. Aortic atherosclerosis. Electronically Signed   By: Marijo Conception, M.D.   On: 02/15/2017 19:34    ASSESSMENT Acute hypoxic respiratory failure requiring BiPAP Acute pulmonary edema secondary to volume overload End-stage renal disease on hemodialysis  PLAN To titrate off BiPAP as needed Hemodialysis per nephrology Transfer out of the ICU after hemodialysis if stable   Jadine Brumley S. [facility] ANP-BC Pulmonary and Critical Care Medicine [facility] Pager [phone] or [phone]  02/16/2017, 8:44 AM

## 2017-02-16 NOTE — Progress Notes (Signed)
HD tx start 

## 2017-02-16 NOTE — Progress Notes (Signed)
HD tx end  

## 2017-02-16 NOTE — Progress Notes (Addendum)
[facility] [place] NAME: [name]    MR#:  [id number]  DATE OF BIRTH:  11-28-1955  SUBJECTIVE:  CHIEF COMPLAINT:   Chief Complaint  Patient presents with  . Shortness of Breath  . Chest Pain   Still has cough and shortness of breath, on oxygen 2 L, off BiPAP. REVIEW OF SYSTEMS:  Review of Systems  Constitutional: Positive for malaise/fatigue. Negative for chills and fever.  HENT: Negative for sore throat.   Eyes: Negative for blurred vision and double vision.  Respiratory: Positive for cough, sputum production and shortness of breath. Negative for hemoptysis, wheezing and stridor.   Cardiovascular: Positive for leg swelling. Negative for chest pain, palpitations and orthopnea.  Gastrointestinal: Negative for abdominal pain, blood in stool, diarrhea, melena, nausea and vomiting.  Genitourinary: Negative for dysuria, flank pain and hematuria.  Musculoskeletal: Negative for back pain and joint pain.  Skin: Negative for rash.  Neurological: Negative for dizziness, sensory change, focal weakness, seizures, loss of consciousness, weakness and headaches.  Endo/Heme/Allergies: Negative for polydipsia.  Psychiatric/Behavioral: Negative for depression. The patient is not nervous/anxious.     DRUG ALLERGIES:   Allergies  Allergen Reactions  . Sulfa Antibiotics Rash   VITALS:  Blood pressure 132/85, pulse 71, temperature 98.7 F (37.1 C), temperature source Oral, resp. rate 16, height 5\' 8"  (1.727 m), weight 163 lb 9.3 oz (74.2 kg), SpO2 96 %. PHYSICAL EXAMINATION:  Physical Exam  Constitutional: He is oriented to person, place, and time and well-developed, well-nourished, and in no distress.  HENT:  Head: Normocephalic.  Mouth/Throat: Oropharynx is clear and moist.  Eyes: Conjunctivae and EOM are normal. Pupils are equal, round, and reactive to light. No scleral icterus.  Neck: Normal range of motion. Neck supple. No JVD  present. No tracheal deviation present.  Cardiovascular: Normal rate, regular rhythm and normal heart sounds. Exam reveals no gallop.  No murmur heard. Pulmonary/Chest: Effort normal. No respiratory distress. He has no wheezes. He has rales.  Abdominal: Soft. Bowel sounds are normal. He exhibits no distension. There is no tenderness. There is no rebound.  Musculoskeletal: Normal range of motion. He exhibits edema. He exhibits no tenderness.  Neurological: He is alert and oriented to person, place, and time. No cranial nerve deficit.  Skin: No rash noted. No erythema.  Psychiatric: Affect normal.   LABORATORY PANEL:  Male CBC Recent Labs  Lab 02/15/17 1918  WBC 7.3  HGB 12.2*  HCT 38.1*  PLT 158   ------------------------------------------------------------------------------------------------------------------ Chemistries  Recent Labs  Lab 02/16/17 0415  NA 139  K 3.0*  CL 100*  CO2 30  GLUCOSE 65  BUN 22*  CREATININE 3.31*  CALCIUM 8.5*   RADIOLOGY:  Dg Chest Port 1 View  Result Date: 02/16/2017 CLINICAL DATA:  Shortness of Breath EXAM: PORTABLE CHEST 1 VIEW COMPARISON:  February 15, 2017 FINDINGS: There is stable cardiomegaly with pulmonary venous hypertension. There is mild left base atelectasis. There is no edema or consolidation. There is aortic atherosclerosis. Central catheter tip is in the superior vena cava. No adenopathy. No bone lesions. No pneumothorax. IMPRESSION: Cardiomegaly with pulmonary vascular congestion. Mild left base atelectasis. No edema or consolidation. There is aortic atherosclerosis. Central catheter tip in superior vena cava. Aortic Atherosclerosis (ICD10-I70.0). Electronically Signed   By: Lowella Grip III M.D.   On: 02/16/2017 10:33   Dg Chest Portable 1 View  Result Date: 02/15/2017 CLINICAL DATA:  Shortness of breath, chest pain. EXAM: PORTABLE  CHEST 1 VIEW COMPARISON:  Radiographs of November 20, 2016. FINDINGS: Stable cardiomegaly  and central pulmonary vascular congestion is noted. Aortic atherosclerosis is noted. Stable position of right internal jugular catheter. No pneumothorax or pleural effusion is noted. Both lungs are clear. The visualized skeletal structures are unremarkable. IMPRESSION: Stable cardiomegaly and central pulmonary vascular congestion. Aortic atherosclerosis. Electronically Signed   By: Marijo Conception, M.D.   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  The patient got emergent hemodialysis last night.  off BiPAP, on O2 [place]. Continue Lasix twice daily, continue Entresto, DuoNeb every 6 hours.  Elevated troponin, due to demanding ischemia due to above.  ESRD.  Continue hemodialysis.  Hypertension.  Continue home hypertension medication.  Hypokalemia.  Adjust potassium during dialysis.  Follow-up BMP.  Discussed with Dr. Alva Garnet.  All the records are reviewed and case discussed with Care Management/Social Worker. Management plans discussed with the patient, family and they are in agreement.  CODE STATUS: Full Code  TOTAL TIME TAKING CARE OF THIS PATIENT: 36 minutes.   More than 50% of the time was spent in counseling/coordination of care: YES  POSSIBLE D/C IN 2 DAYS, DEPENDING ON CLINICAL CONDITION.   Demetrios Loll M.D on 02/16/2017 at 1:52 PM  Between 7am to 6pm - Pager - [phone]  After 6pm go to [email or web address] - [facility]

## 2017-02-16 NOTE — Progress Notes (Signed)
[facility] [place], [place] 02/16/17  Subjective:   Emergent HD last evening Feels better this morning Reports poor appetite Now on [place]  3600 cc removed with HD   Objective:  Vital signs in last 24 hours:  Temp:  [98.3 F (36.8 C)-99.1 F (37.3 C)] 98.7 F (37.1 C) (12/26 1214) Pulse Rate:  [37-83] 71 (12/26 1214) Resp:  [12-35] 16 (12/26 1214) BP: (132-170)/(75-127) 132/85 (12/26 1214) SpO2:  [90 %-100 %] 96 % (12/26 1214) FiO2 (%):  [30 %] 30 % (12/25 2127) Weight:  [74.2 kg (163 lb 9.3 oz)-78.5 kg (173 lb)] 74.2 kg (163 lb 9.3 oz) (12/26 0328)  Weight change:  Filed Weights   02/15/17 2127 02/15/17 2320 02/16/17 0328  Weight: 77.8 kg (171 lb 8.3 oz) 78.2 kg (172 lb 6.4 oz) 74.2 kg (163 lb 9.3 oz)    Intake/Output:    Intake/Output Summary (Last 24 hours) at 02/16/2017 1418 Last data filed at 02/16/2017 1751 Gross per 24 hour  Intake 0 ml  Output 3652 ml  Net -3652 ml     Physical Exam: General:  Laying in the bed, no acute distress  HEENT  anicteric  Neck  supple  Pulm/lungs  decreased breath sounds at bases, no crackles  CVS/Heart  no rub  Abdomen:   Soft, nontender  Extremities:  Trace edema  Neurologic:  Alert, oriented  Skin:  Vitiligo  Access:  Tunneled dialysis catheter       Basic Metabolic Panel:  Recent Labs  Lab 02/15/17 1918 02/16/17 0415  NA 138 139  K 4.7 3.0*  CL 105 100*  CO2 18* 30  GLUCOSE 118* 65  BUN 62* 22*  CREATININE 7.69* 3.31*  CALCIUM 8.7* 8.5*     CBC: Recent Labs  Lab 02/15/17 1918  WBC 7.3  HGB 12.2*  HCT 38.1*  MCV 95.3  PLT 158      Lab Results  Component Value Date   HEPBSAG Negative 09/20/2016   HEPBSAB Reactive 06/07/2016      Microbiology:  Recent Results (from the past 240 hour(s))  MRSA PCR Screening     Status: None   Collection Time: 02/15/17  9:34 PM  Result Value Ref Range Status   MRSA by PCR NEGATIVE NEGATIVE Final    Comment:        The GeneXpert MRSA  Assay (FDA approved for NASAL specimens only), is one component of a comprehensive MRSA colonization surveillance program. It is not intended to diagnose MRSA infection nor to guide or monitor treatment for MRSA infections. Performed at [facility], [address] [place], [place] [address]     Coagulation Studies: No results for input(s): LABPROT, INR in the last 72 hours.  Urinalysis: No results for input(s): COLORURINE, LABSPEC, PHURINE, GLUCOSEU, HGBUR, BILIRUBINUR, KETONESUR, PROTEINUR, UROBILINOGEN, NITRITE, LEUKOCYTESUR in the last 72 hours.  Invalid input(s): APPERANCEUR    Imaging: Dg Chest Port 1 View  Result Date: 02/16/2017 CLINICAL DATA:  Shortness of Breath EXAM: PORTABLE CHEST 1 VIEW COMPARISON:  February 15, 2017 FINDINGS: There is stable cardiomegaly with pulmonary venous hypertension. There is mild left base atelectasis. There is no edema or consolidation. There is aortic atherosclerosis. Central catheter tip is in the superior vena cava. No adenopathy. No bone lesions. No pneumothorax. IMPRESSION: Cardiomegaly with pulmonary vascular congestion. Mild left base atelectasis. No edema or consolidation. There is aortic atherosclerosis. Central catheter tip in superior vena cava. Aortic Atherosclerosis (ICD10-I70.0). Electronically Signed   By: Lowella Grip  III M.D.   On: 02/16/2017 10:33   Dg Chest Portable 1 View  Result Date: 02/15/2017 CLINICAL DATA:  Shortness of breath, chest pain. EXAM: PORTABLE CHEST 1 VIEW COMPARISON:  Radiographs of November 20, 2016. FINDINGS: Stable cardiomegaly and central pulmonary vascular congestion is noted. Aortic atherosclerosis is noted. Stable position of right internal jugular catheter. No pneumothorax or pleural effusion is noted. Both lungs are clear. The visualized skeletal structures are unremarkable. IMPRESSION: Stable cardiomegaly and central pulmonary vascular congestion. Aortic atherosclerosis.  Electronically Signed   By: Marijo Conception, M.D.   On: 02/15/2017 19:34     Medications:   . sodium chloride     . atorvastatin  40 mg Oral Daily  . calcium acetate  1,334 mg Oral TID WC  . carvedilol  6.25 mg Oral BID WC  . cinacalcet  90 mg Oral Daily  . furosemide  40 mg Intravenous Q12H  . heparin  5,000 Units Subcutaneous Q8H  . hydrALAZINE  100 mg Oral TID  . sacubitril-valsartan  1 tablet Oral BID  . sodium chloride flush  3 mL Intravenous Q12H   sodium chloride, acetaminophen **OR** acetaminophen, albuterol, bisacodyl, diphenhydrAMINE, guaiFENesin-codeine, nitroGLYCERIN, ondansetron **OR** ondansetron (ZOFRAN) IV, senna-docusate, sodium chloride flush  Assessment/ Plan:  61 y.o. African-American male with end stage renal disease on hemodialysis, hypertension, hepatitis C, anemia, seizure disorder, COPD/tobacco abuse, history of substance abuse,  secondary hyperparathyroidism  [facility] MWF [place]  1.  ESRD with volume overload and flash pulmonary edema Patient required BiPAP Now on nasal cannula after 3.6 L of fluid was removed  Echo from 04/30/16 with EF of 20-25%.     2. Anemia of chronic kidney disease: hemoglobin 12.2 - epo as outpatient.   3. Secondary Hyperparathyroidism: with hyperphosphatemia. Outpatient phosphorus 7.4, calcium 8 and PTH 220 - cinalcet - calcium acetate with meals.      LOS: [place], [place] 12/26/20182:18 PM  [facility] [place], [place]

## 2017-02-16 NOTE — Progress Notes (Signed)
Pt underwent HD last night. He is very comfortable on [place] O2 @ 1 LPM. Will transfer to [place] and sign off  Merton Border, MD PCCM service Mobile [phone] Pager [phone] 02/16/2017 8:32 AM

## 2017-02-17 DIAGNOSIS — I5023 Acute on chronic systolic (congestive) heart failure: Secondary | ICD-10-CM

## 2017-02-17 DIAGNOSIS — I493 Ventricular premature depolarization: Secondary | ICD-10-CM

## 2017-02-17 LAB — BASIC METABOLIC PANEL
Anion gap: 8 (ref 5–15)
BUN: 18 mg/dL (ref 6–20)
CHLORIDE: 100 mmol/L — AB (ref 101–111)
CO2: 29 mmol/L (ref 22–32)
CREATININE: 3.89 mg/dL — AB (ref 0.61–1.24)
Calcium: 8.3 mg/dL — ABNORMAL LOW (ref 8.9–10.3)
GFR calc non Af Amer: 15 mL/min — ABNORMAL LOW (ref >60)
GFR, EST AFRICAN AMERICAN: 18 mL/min — AB (ref >60)
Glucose, Bld: 142 mg/dL — ABNORMAL HIGH (ref 65–99)
Potassium: 4.4 mmol/L (ref 3.5–5.1)
Sodium: 137 mmol/L (ref 135–145)

## 2017-02-17 LAB — HEPATITIS B CORE ANTIBODY, TOTAL: Hep B Core Total Ab: POSITIVE — AB

## 2017-02-17 LAB — DIGOXIN LEVEL: DIGOXIN LVL: 0.2 ng/mL — AB (ref 0.8–2.0)

## 2017-02-17 LAB — HEPATITIS B SURFACE ANTIGEN: Hepatitis B Surface Ag: NEGATIVE

## 2017-02-17 LAB — HEPATITIS B SURFACE ANTIBODY,QUALITATIVE: Hep B S Ab: REACTIVE

## 2017-02-17 LAB — TSH: TSH: 1.967 u[IU]/mL (ref 0.350–4.500)

## 2017-02-17 LAB — MAGNESIUM: MAGNESIUM: 1.6 mg/dL — AB (ref 1.7–2.4)

## 2017-02-17 MED ORDER — FUROSEMIDE 40 MG PO TABS
40.0000 mg | ORAL_TABLET | Freq: Two times a day (BID) | ORAL | Status: DC
  Administered 2017-02-17: 40 mg via ORAL
  Filled 2017-02-17: qty 1

## 2017-02-17 MED ORDER — MAGNESIUM SULFATE 2 GM/50ML IV SOLN
2.0000 g | Freq: Once | INTRAVENOUS | Status: AC
  Administered 2017-02-17: 2 g via INTRAVENOUS
  Filled 2017-02-17: qty 50

## 2017-02-17 MED ORDER — CARVEDILOL 6.25 MG PO TABS: 3.1250 mg | ORAL_TABLET | Freq: Two times a day (BID) | ORAL | Status: DC

## 2017-02-17 MED ORDER — SENNOSIDES-DOCUSATE SODIUM 8.6-50 MG PO TABS
1.0000 | ORAL_TABLET | Freq: Every day | ORAL | Status: DC
  Administered 2017-02-17: 1 via ORAL
  Filled 2017-02-17: qty 1

## 2017-02-17 NOTE — Consult Note (Signed)
Cardiology Consultation:   Patient ID: [name]; [id number]; Oct 14, 1955   Admit date: 02/15/2017 Date of Consult: 02/17/2017  Primary Care Provider: [facility] [place] Primary Cardiologist: End   Patient Profile:   [name] is a 61 y.o. male with a hx of h/o mixed NICM/ICM by [facility] in 06/2016 as detailed below, chronic combined CHF, ESRD on HD (MWF), anemia of chronic disease, pulmonary HTN, hypertensive heart disease and PVD who is being seen today for the evaluation of bradycardia at the request of Dr. Bridgett Larsson.  History of Present Illness:   Mr. Hauss Previously refused LHC for further evaluation of his CHF. Prior nuclear stress test in 09/2013 low risk, EF 48%. TTE 10/2013 EF 50%, DD, TTE 10/2014 EF 30-35% with diffuse HK, GR1DD, mild AI, mild to moderate MR, moderate to severe pulmonary hypertension, TTE 04/2016 EF 20-25%, diffuse HK, GR3DD, moderate AI, RV moderately dilated, moderate TR. TTE 06/2016 with EF 30-35%, GR2DD, mild to moderate MR, mild AI, dilated RV, severe pulmonary HTN. LHC by [facility] 06/2016 showed subtotal occlusion of proximal RCA with left-to-right collaterals, diffuse disease of the diagonal branches, LAD and LCx with mild to moderate diffuse disease 30-40%, and elevated LVEDP. His volume is managed by HD. He continues to make a small amount of urine and is on torsemide at home. Recently admitted to [facility] in July x 2 for volume overload. He underwent HD with improvement in symptoms. He has been managed by the [facility] and has been transitioned from losartan to [facility] by them (HD dependent). Patient was admitted on 12/25 with flash pulmonary edema requiring BiPAP and emergent HD. Weight up 6 pounds when compared to CHF Clinic weight from 10/2016. Symptoms have improved with HD on 12/25 and 12/26, though still with SOB and fatigue. On the evening of 12/26, after using the restroom the patient reportedly became bradycardic into the 30s bpm per  RN report. No documentation of this event or evaluation. He was reportedly not on telemetry at that time. His Coreg was held. This morning, after straining to have a BM associated with constipation, the patient complained of increased fatigue and presyncope. Vitals signs obtained documented a heart rate in the 30s bpm. He was not on telemetry at that time. Of note, on patient's admission EKG and review of telemetry from 12/26 when he was on telemetry show frequent PVCs, occasionally in a pattern of ventricular bigeminy. RN reports patient's manual pulse did not feel like bigeminy. IM has cancelled his Coreg for this morning. Never with chest pain. Since this episode, he has been placed back on telemetry and has remained in NSR with a heart rate in the 70s bpm.    Past Medical History:  Diagnosis Date  . Arthritis   . Chest pain    a. 09/2013 Myov[facility]): basal inf defect more pronounced @ rest, likely artifact, EF 48%, low risk study.  . Chronic combined systolic and diastolic CHF (congestive heart failure) ([place])    a. 10/2013 Echo [facility]): EF 50%, diast dysfxn, triv AI/TR, mild MR;  b. 10/2014 Echo: EF 30-35%, diff HK, gr1 DD, mild AI, mild to mod MR/TR, mod dil LA, nl RV, mod to sev PAH;  c. 04/2016 Echo: EF 20-25%, sev dil LV, diff HK, gr3 DD, mod AI, MV syst bowing w/ prolapse, mod dil RV, mod TR.  . Diabetes ([place])   . Dialysis patient ([place])    Mon, Wed, Fri  . Dyspnea   .  ESRD (end stage renal disease) ([place])    a. MWF dialysis @ [facility].  . Hepatitis    Patient is unsure type of Hepatitis he has  . Hypertension   . Hypertensive heart disease with CHF (congestive heart failure) ([place])   . Peripheral vascular disease ([place])   . Pulmonary hypertension ([place]) 04/2016  . Renal insufficiency     Past Surgical History:  Procedure Laterality Date  . A/V FISTULAGRAM Left 04/06/2016   Procedure: A/V Fistulagram;  Surgeon: Katha Cabal, MD;  Location: [place] [facility];  Service:  Cardiovascular;  Laterality: Left;  . A/V FISTULAGRAM Left 01/19/2017   Procedure: A/V FISTULAGRAM;  Surgeon: Katha Cabal, MD;  Location: [place] [facility];  Service: Cardiovascular;  Laterality: Left;  . A/V SHUNT INTERVENTION N/A 04/06/2016   Procedure: A/V Shunt Intervention;  Surgeon: Katha Cabal, MD;  Location: [place] [facility];  Service: Cardiovascular;  Laterality: N/A;  . A/V SHUNT INTERVENTION N/A 06/29/2016   Procedure: A/V Shunt Intervention;  Surgeon: Katha Cabal, MD;  Location: [place] [facility];  Service: Cardiovascular;  Laterality: N/A;  . AV FISTULA PLACEMENT Left 2014  . DIALYSIS/PERMA CATHETER INSERTION Right    Dr. Delana Meyer  . PERIPHERAL VASCULAR CATHETERIZATION N/A 10/03/2014   Procedure: A/V Shuntogram/Fistulagram;  Surgeon: Algernon Huxley, MD;  Location: [place] [facility];  Service: Cardiovascular;  Laterality: N/A;  . PERIPHERAL VASCULAR CATHETERIZATION Left 10/03/2014   Procedure: A/V Shunt Intervention;  Surgeon: Algernon Huxley, MD;  Location: [place] [facility];  Service: Cardiovascular;  Laterality: Left;  . PERIPHERAL VASCULAR CATHETERIZATION Left 05/01/2015   Procedure: A/V Shuntogram/Fistulagram;  Surgeon: Algernon Huxley, MD;  Location: [place] [facility];  Service: Cardiovascular;  Laterality: Left;  . PERIPHERAL VASCULAR CATHETERIZATION N/A 05/01/2015   Procedure: A/V Shunt Intervention;  Surgeon: Algernon Huxley, MD;  Location: [place] [facility];  Service: Cardiovascular;  Laterality: N/A;  . UPPER EXTREMITY ANGIOGRAPHY Bilateral 06/29/2016   Procedure: Upper Extremity Angiography;  Surgeon: Katha Cabal, MD;  Location: [place] [facility];  Service: Cardiovascular;  Laterality: Bilateral;  . UPPER EXTREMITY INTERVENTION  06/29/2016   Procedure: Upper Extremity Intervention;  Surgeon: Katha Cabal, MD;  Location: [place] [facility];  Service: Cardiovascular;;     Home Meds: Prior to Admission medications   Medication Sig Start  Date End Date Taking? Authorizing Provider  acetaminophen (TYLENOL) 325 MG tablet Take 2 tablets (650 mg total) by mouth every 6 (six) hours as needed for mild pain (or Fever >/= 101). Patient taking differently: Take 500 mg by mouth every 6 (six) hours as needed for mild pain (or Fever >/= 101).  09/21/16   Gouru, Aruna, MD  albuterol (PROVENTIL HFA;VENTOLIN HFA) 108 (90 Base) MCG/ACT inhaler Inhale into the lungs every 4 (four) hours as needed for wheezing or shortness of breath.    [provider]  atorvastatin (LIPITOR) 40 MG tablet Take 1 tablet (40 mg total) by mouth daily. 10/26/16 01/24/17  Alisa Graff, FNP  calcium acetate (PHOSLO) 667 MG capsule Take 1,334 mg by mouth 3 (three) times daily with meals. Take 2001 mgs with meals 3 times daily and 1334 with snacks 12/11/15   [provider]  carvedilol (COREG) 6.25 MG tablet Take 1 tablet (6.25 mg total) by mouth 2 (two) times daily with a meal. 09/21/16   Gouru, Aruna, MD  cinacalcet (SENSIPAR) 90 MG tablet Take 90 mg by mouth daily.  [provider]  hydrALAZINE (APRESOLINE) 50 MG tablet Take 100 mg by mouth 3 (three) times daily.  07/01/16   [provider]  ipratropium (ATROVENT HFA) 17 MCG/ACT inhaler Inhale 2 puffs into the lungs every 4 (four) hours as needed for wheezing.    [provider]  isosorbide mononitrate (IMDUR) 30 MG 24 hr tablet Take 1 tablet (30 mg total) by mouth daily. Patient not taking: Reported on 10/26/2016 08/24/16 11/22/16  Dustin Flock, MD  lidocaine-prilocaine (EMLA) cream Apply 1 application topically as needed (dialysis access).    [provider]  multivitamin (RENA-VIT) TABS tablet Take 1 tablet by mouth daily.    [provider]  nitroGLYCERIN (NITROSTAT) 0.4 MG SL tablet Place 0.4 mg under the tongue every 5 (five) minutes as needed for chest pain.    [provider]  sacubitril-valsartan (ENTRESTO) 24-26 MG Take 1 tablet by mouth 2 (two)  times daily. 10/05/16   Alisa Graff, FNP  torsemide (DEMADEX) 20 MG tablet Take 1 tablet (20 mg total) by mouth daily. 09/07/16 10/26/16  Alisa Graff, FNP    Inpatient Medications: Scheduled Meds: . atorvastatin  40 mg Oral Daily  . calcium acetate  1,334 mg Oral TID WC  . cinacalcet  90 mg Oral Daily  . furosemide  40 mg Intravenous Q12H  . heparin  5,000 Units Subcutaneous Q8H  . hydrALAZINE  100 mg Oral TID  . ofloxacin  1 drop Both Eyes QID  . sacubitril-valsartan  1 tablet Oral BID  . sodium chloride flush  3 mL Intravenous Q12H   Continuous Infusions: . sodium chloride     PRN Meds: sodium chloride, acetaminophen **OR** acetaminophen, albuterol, benzonatate, bisacodyl, diphenhydrAMINE, guaiFENesin-codeine, nitroGLYCERIN, ondansetron **OR** ondansetron (ZOFRAN) IV, senna-docusate, sodium chloride flush  Allergies:   Allergies  Allergen Reactions  . Sulfa Antibiotics Rash    Social History:   Social History   Socioeconomic History  . Marital status: Widowed    Spouse name: Not on file  . Number of children: Not on file  . Years of education: Not on file  . Highest education level: Not on file  Social Needs  . Financial resource strain: Not on file  . Food insecurity - worry: Not on file  . Food insecurity - inability: Not on file  . Transportation needs - medical: Not on file  . Transportation needs - non-medical: Not on file  Occupational History  . Not on file  Tobacco Use  . Smoking status: Former Smoker    Packs/day: 0.25    Years: 40.00    Pack years: 10.00    Types: Cigarettes    Last attempt to quit: 10/12/2016    Years since quitting: 0.3  . Smokeless tobacco: Never Used  Substance and Sexual Activity  . Alcohol use: No    Alcohol/week: 0.0 oz  . Drug use: Yes    Frequency: 1.0 times per week    Types: Cocaine    Comment: 07/30/16 pt states last use 3-4 weeks ago  . Sexual activity: Not Currently  Other Topics Concern  . Not on file  Social  History Narrative   Lives at home in [place] by himself.   Independent on ambulation but does not routinely exercise.     Family History:   Family History  Problem Relation Age of Onset  . Hypertension Son   . Diabetes Son   . Hypertension Mother   . Diabetes Mother   . Diabetes Sister  ROS:  Review of Systems  Constitutional: Positive for malaise/fatigue. Negative for chills, diaphoresis, fever and weight loss.  HENT: Negative for congestion.   Eyes: Negative for discharge and redness.  Respiratory: Positive for shortness of breath. Negative for cough, hemoptysis, sputum production and wheezing.   Cardiovascular: Negative for chest pain, palpitations, orthopnea, claudication, leg swelling and PND.  Gastrointestinal: Positive for constipation. Negative for abdominal pain, blood in stool, heartburn, melena, nausea and vomiting.  Genitourinary: Negative for hematuria.  Musculoskeletal: Negative for falls and myalgias.  Skin: Negative for rash.  Neurological: Positive for weakness. Negative for dizziness, tingling, tremors, sensory change, speech change, focal weakness and loss of consciousness.  Endo/Heme/Allergies: Does not bruise/bleed easily.  Psychiatric/Behavioral: Negative for substance abuse. The patient is not nervous/anxious.   All other systems reviewed and are negative.     Physical Exam/Data:   Vitals:   02/17/17 0520 02/17/17 0651 02/17/17 0653 02/17/17 0851  BP: 118/86  (!) 145/57 128/87  Pulse: 76 (!) 38 78 (!) 39  Resp: 18     Temp: 98.7 F (37.1 C)     TempSrc: Oral     SpO2: 95%   97%  Weight:      Height:        Intake/Output Summary (Last 24 hours) at 02/17/2017 1020 Last data filed at 02/17/2017 0520 Gross per 24 hour  Intake 103 ml  Output 2268 ml  Net -2165 ml   Filed Weights   02/16/17 0328 02/16/17 1705 02/16/17 2024  Weight: 163 lb 9.3 oz (74.2 kg) 103 lb 9.9 oz (47 kg) 99 lb 3.3 oz (45 kg)   Body mass index is 15.08 kg/m.    Physical Exam: General: Frail appearing, in no acute distress. Head: Normocephalic, atraumatic, sclera non-icteric, no xanthomas, nares without discharge.  Neck: Negative for carotid bruits. JVD not elevated. Lungs: Diminished breath sounds bilaterally. Breathing is unlabored. Heart: RRR with S1 S2. II/VI systolic murmur, no rubs, or gallops appreciated. Abdomen: Soft, non-tender, non-distended with normoactive bowel sounds. No hepatomegaly. No rebound/guarding. No obvious abdominal masses. Msk:  Strength and tone appear normal for age. Extremities: No clubbing or cyanosis. No edema. Distal pedal pulses are 2+ and equal bilaterally. Neuro: Alert and oriented X 3. No facial asymmetry. No focal deficit. Moves all extremities spontaneously. Psych:  Responds to questions appropriately with a normal affect.   EKG:  The EKG was personally reviewed and demonstrates: NSR, 94 bpm, ventricular bigeminy, LVH, lateral TWI Telemetry:  Telemetry was personally reviewed and demonstrates: NSR, 70s bpm, frequent PVCs occasionally in a pattern of ventricular bigeminy. No evidence of bradycardic rates on telemetry (patient was not on telemetry when documented vitals of heart rate in the 30s bpm occurred).   Weights: Filed Weights   02/16/17 0328 02/16/17 1705 02/16/17 2024  Weight: 163 lb 9.3 oz (74.2 kg) 103 lb 9.9 oz (47 kg) 99 lb 3.3 oz (45 kg)    Relevant CV Studies: Cardiac cath 06/2016: FINDINGS: Native Vessel Coronary Angiography: - Dominance: Right  - Left Main:Very large  LAD: - Ostial LAD: Large caliber - Proximal LAD: Large caliber with irregularity up to 10% - Mid LAD: Moderate to large caliber with 30-40% stenosis - Distal LAD: Small to moderate caliber with diffuse irregularity up to  40-50% - Diag 1: Small caliber vessel with ostial 90% stenosis - Diag 2: Small to moderate caliber branching vessel with irregularity up  to 40% - Diag 3: Small caliber moderate distribution with  diffuse irregularity  proximally  up to 60-70% - Diag 4: Small to moderate caliber moderate distribution vessel with  irregularity up to 30% - Diag 5: Small caliber vessel  LCX: - Ostial LCx: Moderate to large caliber - Proximal LCx: Moderate to large caliber with irregularity up to 10% - Mid LCx: Moderate caliber with irregularity up to 30% - Distal LCx: Small to moderate caliber with irregularity to 20% - OM1: Moderate caliber moderate distribution with irregularity up to 30% - OM2: Very small caliber - OM3: Moderate caliber moderate distribution vessel with irregularity up  to 40%  RCA: - Ostial RCA: Moderate caliber - Proximal RCA: Functionally subtotaled with bridging collaterals poorly  opacifying the mid vessel - Mid RCA: Small to moderate caliber primarily being filled retrograde  from collaterals from the left system - Distal RCA: Moderate caliber poorly visualized filling primarily via  collaterals from left system - Right PDA: Poorly visualized fills via collaterals - RCA continuation: Poorly visualized fills via collaterals - PL branch 1: Poorly visualized fills by collaterals  Left Ventriculogram: - Not performed  Hemodynamics: - Aortic pressure: 111/70  - Mean: 85 - LVEDP (pre-A wave): 15 - LVEDP (post-A wave): 25-30   CONCLUSIONS: - Subtotaled proximal RCA with left to right collaterals - Diagonal branches with diffuse disease - Left anterior descending and circumflex systems with mild to moderate  diffuse disease up to 30-40% - Elevated left ventricular end-diastolic pressure of 30 post A wave   INDICATION:61 year old and on dialysis with new drop in LV systolic  function for further evaluation  PLAN: Optimize medical therapy   Echo 06/2016: Ultrasound enhancing agent utilized to improve endocardial border  definition  Dilated left ventricle - mild  Left ventricular hypertrophy - mild  Severely decreased left ventricular systolic  function, ejection fraction  30 to 06%  Diastolic dysfunction - grade II (elevated filling pressures)  Degenerative mitral valve disease  Mitral annular calcification  Mitral regurgitation - mild to moderate  Dilated left atrium - moderate  Aortic sclerosis  Aortic regurgitation - mild  Dilated right ventricle  Mildly decreased right ventricular systolic function  Tricuspid regurgitation - mild  Elevated pulmonary artery systolic pressure - severe  Elevated right atrial pressure  Laboratory Data:  Chemistry Recent Labs  Lab 02/15/17 1918 02/16/17 0415 02/17/17 0359  NA 138 139 137  K 4.7 3.0* 4.4  CL 105 100* 100*  CO2 18* 30 29  GLUCOSE 118* 65 142*  BUN 62* 22* 18  CREATININE 7.69* 3.31* 3.89*  CALCIUM 8.7* 8.5* 8.3*  GFRNONAA 7* 19* 15*  GFRAA 8* 21* 18*  ANIONGAP 15 9 8     No results for input(s): PROT, ALBUMIN, AST, ALT, ALKPHOS, BILITOT in the last 168 hours. Hematology Recent Labs  Lab 02/15/17 1918  WBC 7.3  RBC 3.99*  HGB 12.2*  HCT 38.1*  MCV 95.3  MCH 30.6  MCHC 32.1  RDW 18.5*  PLT 158   Cardiac Enzymes Recent Labs  Lab 02/15/17 1918 02/15/17 2353 02/16/17 0415  TROPONINI 0.17* 0.18* 0.19*   No results for input(s): TROPIPOC in the last 168 hours.  BNPNo results for input(s): BNP, PROBNP in the last 168 hours.  DDimer No results for input(s): DDIMER in the last 168 hours.  Radiology/Studies:  Dg Chest Port 1 View  Result Date: 02/16/2017 IMPRESSION: Cardiomegaly with pulmonary vascular congestion. Mild left base atelectasis. No edema or consolidation. There is aortic atherosclerosis. Central catheter tip in superior vena cava. Aortic Atherosclerosis (ICD10-I70.0). Electronically Signed   By: Gwyndolyn Saxon  Jasmine December III M.D.   On: 02/16/2017 10:33   Dg Chest Portable 1 View  Result Date: 02/15/2017 IMPRESSION: Stable cardiomegaly and central pulmonary vascular congestion. Aortic atherosclerosis. Electronically Signed   By: Marijo Conception, M.D.   On: 02/15/2017 19:34    Assessment and Plan:   1. Possible bradycardia/ventricular bigeminy: -RN reports patient complained of increased fatigue and presyncope after using the restroom this morning. Obtained vitals at that time documented a heart rate in the 30s bpm. Patient confirmed he had a bowel movement leading up to these events that required considerable straining. He was not on telemetry at this time. He has also been documented to have frequent episodes of ventricular bigeminy (noted on admission 12-lead EKG and while on telemetry on 12/26).  -It remains uncertain if he had a bradycardic event from increased vagal tone while straining to have a bowel movement vs possible episode of ventricular bigeminy vs undiagnosed block. -He last received Coreg on the morning of 12/26 (this was held in the PM of 12/26 for a similar episode while using the restroom per RN.  -Coreg has been held this morning by IM. -If these episodes are related to ventricular bigeminy, beta blockade would be beneficial . -If these episodes are related to increased vagal tone with constipation, recommend Im address his constipation.  -Continue to monitor on telemetry for evidence of high grade heart block.  -Check TSH, digoxin level (not on this medication), and magnesium   2. Flash pulmonary edema/acute on chronic combined CHF: -Status post emergent HD and BiPAP -Now on room air -Volume is managed by HD -On Entresto per CHF Clinic and nephrology (HD dependent) -Coreg on hold as above while possible bradycardia is being evaluated -Lasix -CHF education -Daily weights  3. Elevated troponin: -Never with chest pain -Likely supply demand ischemia in the setting of volume overload with ESRD -Recent LHC in 06/2016 as above -No plans for inpatient ischemic evaluation  -Continue PTA medications as able  4. ESRD: -Per nephrology   5. Hypokalemia: -Resolved -Per Renal   For questions or updates,  please contact [place] Please consult [email or web address] for contact info under Cardiology/STEMI.   Signed, Christell Faith, PA-C [place] Pager: [phone] 02/17/2017, 10:20 AM

## 2017-02-17 NOTE — Progress Notes (Signed)
Patient in NSR with PVCs.  Continue to monitor.

## 2017-02-17 NOTE — Progress Notes (Signed)
Progress Note  Patient Name: [name] Date of Encounter: 02/18/2017  Primary Cardiologist: End  Subjective   No acute overnight events. No further possible bradycardic episodes. Has had occasional PVCs in a pattern of ventricular bigeminy. Magnesium low at 1.6.   Inpatient Medications    Scheduled Meds: . atorvastatin  40 mg Oral Daily  . calcium acetate  1,334 mg Oral TID WC  . carvedilol  3.125 mg Oral BID WC  . cinacalcet  90 mg Oral Daily  . furosemide  40 mg Oral BID  . heparin  5,000 Units Subcutaneous Q8H  . hydrALAZINE  100 mg Oral TID  . magnesium oxide  400 mg Oral Daily  . ofloxacin  1 drop Both Eyes QID  . sacubitril-valsartan  1 tablet Oral BID  . senna-docusate  1 tablet Oral QHS  . sodium chloride flush  3 mL Intravenous Q12H   Continuous Infusions: . sodium chloride     PRN Meds: sodium chloride, acetaminophen **OR** acetaminophen, albuterol, benzonatate, bisacodyl, diphenhydrAMINE, guaiFENesin-codeine, nitroGLYCERIN, ondansetron **OR** ondansetron (ZOFRAN) IV, sodium chloride flush   Vital Signs    Vitals:   02/17/17 2109 02/17/17 2223 02/18/17 0500 02/18/17 0541  BP: (!) 145/67 (!) 145/87  (!) 141/77  Pulse: (!) 37 81  77  Resp: 20   17  Temp: 98.7 F (37.1 C)   98.4 F (36.9 C)  TempSrc: Oral   Oral  SpO2: 100% 96%  100%  Weight:   158 lb 4.6 oz (71.8 kg)   Height:        Intake/Output Summary (Last 24 hours) at 02/18/2017 0943 Last data filed at 02/18/2017 0541 Gross per 24 hour  Intake 583 ml  Output 100 ml  Net 483 ml   Filed Weights   02/16/17 2024 02/17/17 1200 02/18/17 0500  Weight: 99 lb 3.3 oz (45 kg) 158 lb 6.4 oz (71.8 kg) 158 lb 4.6 oz (71.8 kg)    Telemetry    NSR with frequent PVCs, occasionally in a pattern of ventricular bigeminy- Personally Reviewed  ECG    n/a  Physical Exam   GEN: No acute distress.   Neck: No JVD Cardiac: RRR, no murmurs, rubs, or gallops.  Respiratory: Clear to auscultation  bilaterally. GI: Soft, nontender, non-distended  MS: No edema; No deformity. Neuro:  Nonfocal  Psych: Normal affect   Labs    Chemistry Recent Labs  Lab 02/16/17 0415 02/17/17 0359 02/18/17 0356  NA 139 137 135  K 3.0* 4.4 4.6  CL 100* 100* 99*  CO2 30 29 27   GLUCOSE 65 142* 103*  BUN 22* 18 32*  CREATININE 3.31* 3.89* 5.91*  CALCIUM 8.5* 8.3* 8.1*  GFRNONAA 19* 15* 9*  GFRAA 21* 18* 11*  ANIONGAP 9 8 9      Hematology Recent Labs  Lab 02/15/17 1918  WBC 7.3  RBC 3.99*  HGB 12.2*  HCT 38.1*  MCV 95.3  MCH 30.6  MCHC 32.1  RDW 18.5*  PLT 158    Cardiac Enzymes Recent Labs  Lab 02/15/17 1918 02/15/17 2353 02/16/17 0415  TROPONINI 0.17* 0.18* 0.19*   No results for input(s): TROPIPOC in the last 168 hours.   BNPNo results for input(s): BNP, PROBNP in the last 168 hours.   DDimer No results for input(s): DDIMER in the last 168 hours.   Radiology    Dg Chest Port 1 View  Result Date: 02/16/2017 IMPRESSION: Cardiomegaly with pulmonary vascular congestion. Mild left base atelectasis. No edema or consolidation.  There is aortic atherosclerosis. Central catheter tip in superior vena cava. Aortic Atherosclerosis (ICD10-I70.0). Electronically Signed   By: Lowella Grip III M.D.   On: 02/16/2017 10:33    Cardiac Studies   Prior nuclear stress test in 09/2013 low risk, EF 48%. TTE 10/2013 EF 50%, DD, TTE 10/2014 EF 30-35% with diffuse HK, GR1DD, mild AI, mild to moderate MR, moderate to severe pulmonary hypertension, TTE 04/2016 EF 20-25%, diffuse HK, GR3DD, moderate AI, RV moderately dilated, moderate TR. TTE 06/2016 with EF 30-35%, GR2DD, mild to moderate MR, mild AI, dilated RV, severe pulmonary HTN. LHC by [facility] 06/2016 showed subtotal occlusion of proximal RCA with left-to-right collaterals, diffuse disease of the diagonal branches, LAD and LCx with mild to moderate diffuse disease 30-40%, and elevated LVEDP.   Patient Profile     [name] is a 61 y.o. 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   Assessment & Plan     1. Possible bradycardia/ventricular bigeminy: -Fatigue and presyncope after using the restroom (BM) on 12/27 AM -Reported heart rate in the 30s bpm -Was not on telemetry at this time -Frequent episodes of ventricular bigeminy (noted on admission 12-lead EKG and while on telemetry on 00/86) -Uncertain if these possible bradycardic event was related to increased vagal tone with constipation vs factious bradycardia in the setting of ventricular bigeminy, less likely high-grade AV block -Replete magnesium to goal > 2.0 -Potassium managed by nephrology  -TSH normal -Continue to monitor on telemetry -Would resume Coreg -Consider outpatient Holter to quantify ectopy -May need EP consult for possible AAT  2. Flash pulmonary edema/acute on chronic combined CHF: -Status post emergent HD and BiPAP -Volume is managed by HD -On Entresto per CHF Clinic and nephrology (HD dependent) -Daily weights  3. Elevated troponin: -Likely supply demand ischemia in the setting of volume overload with ESRD -Recent LHC in 06/2016 -Continue putpt meds  4. ESRD: -Per nephrology   5. Hypokalemia: -Resolved   For questions or updates, please contact [place] Please consult [email or web address] for contact info under Cardiology/STEMI.      Signed, Christell Faith, PA-C  02/18/2017, 9:43 AM    Attending Note Patient seen and examined, agree with detailed note above,  Patient presentation and plan discussed on rounds.   Feels well this morning, on dialysis Telemetry reviewed, On dialysis during my exam he is having episodes of paroxysmal PVCs in bigeminal pattern Asymptomatic Plan is to pull 1 L off this morning then potential discharge home  On physical exam  comfortable, alert, JVP 10+, lungs clear to auscultation bilaterally, scattered rales, heart sounds regular/bradycardic when he is having PVCs in bigeminal pattern rate 35 by auscultation, otherwise rate in the 70s, Abdomen soft nontender, no significant lower extremity edema  Lab work reviewed showing creatinine 5.9, potassium 4.6, digoxin 0.2, TSH 1.9, troponin 0 0.19  A/P 1) bradycardia On auscultation and automated cuff he will appear to have bradycardia when having PVCs in bigeminal pattern On telemetry rate is in the 70s even with bigeminal pattern PVCs are paroxysmal, asymptomatic Replete potassium and magnesium as you are doing Already on beta-blocker Would suggest outpatient clinic follow-up for further monitoring If he does become symptomatic could placed on 2-week zio patch  Greater than 50% was spent in counseling and coordination of care with patient Total encounter time 25  minutes or more   Signed: Esmond Plants  M.D., Ph.D. [facility]

## 2017-02-17 NOTE — Progress Notes (Signed)
[facility] [place] NAME: [name]    MR#:  [id number]  DATE OF BIRTH:  [birth year]  SUBJECTIVE:  CHIEF COMPLAINT:   Chief Complaint  Patient presents with  . Shortness of Breath  . Chest Pain   Still has cough and shortness of breath, on oxygen 1 L, off BiPAP. REVIEW OF SYSTEMS:  Review of Systems  Constitutional: Positive for malaise/fatigue. Negative for chills and fever.  HENT: Negative for sore throat.   Eyes: Negative for blurred vision and double vision.  Respiratory: Positive for shortness of breath. Negative for cough, hemoptysis, sputum production, wheezing and stridor.   Cardiovascular: Positive for leg swelling. Negative for chest pain, palpitations and orthopnea.  Gastrointestinal: Negative for abdominal pain, blood in stool, diarrhea, melena, nausea and vomiting.  Genitourinary: Negative for dysuria, flank pain and hematuria.  Musculoskeletal: Negative for back pain and joint pain.  Skin: Negative for rash.  Neurological: Negative for dizziness, sensory change, focal weakness, seizures, loss of consciousness, weakness and headaches.  Endo/Heme/Allergies: Negative for polydipsia.  Psychiatric/Behavioral: Negative for depression. The patient is not nervous/anxious.     DRUG ALLERGIES:   Allergies  Allergen Reactions  . Sulfa Antibiotics Rash   VITALS:  Blood pressure 101/63, pulse 79, temperature 97.9 F (36.6 C), temperature source Oral, resp. rate 18, height 5\' 8"  (1.727 m), weight 158 lb 6.4 oz (71.8 kg), SpO2 96 %. PHYSICAL EXAMINATION:  Physical Exam  Constitutional: He is oriented to person, place, and time and well-developed, well-nourished, and in no distress.  HENT:  Head: Normocephalic.  Mouth/Throat: Oropharynx is clear and moist.  Eyes: Conjunctivae and EOM are normal. Pupils are equal, round, and reactive to light. No scleral icterus.  Neck: Normal range of motion. Neck supple. No JVD present.  No tracheal deviation present.  Cardiovascular: Normal rate, regular rhythm and normal heart sounds. Exam reveals no gallop.  No murmur heard. Pulmonary/Chest: Effort normal. No respiratory distress. He has no wheezes. He has rales.  Abdominal: Soft. Bowel sounds are normal. He exhibits no distension. There is no tenderness. There is no rebound.  Musculoskeletal: Normal range of motion. He exhibits edema. He exhibits no tenderness.  Neurological: He is alert and oriented to person, place, and time. No cranial nerve deficit.  Skin: No rash noted. No erythema.  Psychiatric: Affect normal.   LABORATORY PANEL:  Male CBC Recent Labs  Lab 02/15/17 1918  WBC 7.3  HGB 12.2*  HCT 38.1*  PLT 158   ------------------------------------------------------------------------------------------------------------------ Chemistries  Recent Labs  Lab 02/17/17 0359 02/17/17 1058  NA 137  --   K 4.4  --   CL 100*  --   CO2 29  --   GLUCOSE 142*  --   BUN 18  --   CREATININE 3.89*  --   CALCIUM 8.3*  --   MG  --  1.6*   RADIOLOGY:  No results found. ASSESSMENT AND PLAN:   Acute on chronic respiratory failure with hypoxia due to fluid overload and acute on chronic systolic and diastolic CHF ejection fraction 25%.  The patient got emergent hemodialysis,  off BiPAP, on O2 [place]. Continue Lasix twice daily, continue Entresto, DuoNeb  Elevated troponin, due to demanding ischemia due to above.  Bradycardia.  Possible due to vasovagal, he had bradycardia at 30-40s during bowel movement this morning, but improved later.  ESRD.  Continue hemodialysis.  Hypertension.    Low side blood pressure. Continue home hypertension medication but hold  Coreg due to bradycardia.  Hypokalemia.  Improved.  All the records are reviewed and case discussed with Care Management/Social Worker. Management plans discussed with the patient, family and they are in agreement.  CODE STATUS: Full Code  TOTAL TIME  TAKING CARE OF THIS PATIENT: 33 minutes.   More than 50% of the time was spent in counseling/coordination of care: YES  POSSIBLE D/C IN 2 DAYS, DEPENDING ON CLINICAL CONDITION.   Demetrios Loll M.D on 02/17/2017 at 2:53 PM  Between 7am to 6pm - Pager - [phone]  After 6pm go to [email or web address] - [facility]

## 2017-02-17 NOTE — Care Management Note (Signed)
Case Management Note  Patient Details  Name: [name] MRN: [id number] Date of Birth: 1955-06-28  Subjective/Objective:                 Patient is chronic ESRD [facility] MWF.  Has chronic home oxygen through [facility]. Elvera Bicker with [facility] is aware of admission   Action/Plan:    Expected Discharge Date:                  Expected Discharge Plan:     In-House Referral:     Discharge planning Services     Post Acute Care Choice:    Choice offered to:     DME Arranged:    DME Agency:     HH Arranged:    [place] Agency:     Status of Service:     If discussed at [employer], dates discussed:    Additional Comments:  Katrina Stack, RN 02/17/2017, 10:14 AM

## 2017-02-17 NOTE — Progress Notes (Signed)
Patient in the bathroom when I walked in the room.  Stated he felt very lightheaded.  I took his vitals and noted that his HR was between 30-40.  Per nightshift nurse, patient had similar experience last night.  Dr. Bridgett Larsson made aware and ordered cardiac telemetry monitoring as well as cardiology consult.

## 2017-02-17 NOTE — Progress Notes (Signed)
[facility], [place] 02/17/17  Subjective:    Currently on room air 5.6 L of fluid has been removed with hemodialysis Feels overall better Weight 71.8 kg on standing scale   Objective:  Vital signs in last 24 hours:  Temp:  [97.9 F (36.6 C)-99 F (37.2 C)] 99 F (37.2 C) (12/27 1602) Pulse Rate:  [36-79] 36 (12/27 1602) Resp:  [12-34] 18 (12/27 0520) BP: (101-159)/(57-111) 133/60 (12/27 1602) SpO2:  [95 %-100 %] 98 % (12/27 1602) Weight:  [45 kg (99 lb 3.3 oz)-71.8 kg (158 lb 6.4 oz)] 71.8 kg (158 lb 6.4 oz) (12/27 1200)  Weight change: -31.472 kg (-6.1 oz) Filed Weights   02/16/17 1705 02/16/17 2024 02/17/17 1200  Weight: 47 kg (103 lb 9.9 oz) 45 kg (99 lb 3.3 oz) 71.8 kg (158 lb 6.4 oz)    Intake/Output:    Intake/Output Summary (Last 24 hours) at 02/17/2017 1724 Last data filed at 02/17/2017 1639 Gross per 24 hour  Intake 443 ml  Output 2218 ml  Net -1775 ml     Physical Exam: General: s itting up in chair, no acute distress  HEENT  anicteric  Neck  supple  Pulm/lungs  decreased breath sounds at bases, no crackles  CVS/Heart  no rub  Abdomen:   Soft, nontender  Extremities:  Trace edema  Neurologic:  Alert, oriented  Skin:  Vitiligo  Access:  Tunneled dialysis catheter       Basic Metabolic Panel:  Recent Labs  Lab 02/15/17 1918 02/16/17 0415 02/17/17 0359 02/17/17 1058  NA 138 139 137  --   K 4.7 3.0* 4.4  --   CL 105 100* 100*  --   CO2 18* 30 29  --   GLUCOSE 118* 65 142*  --   BUN 62* 22* 18  --   CREATININE 7.69* 3.31* 3.89*  --   CALCIUM 8.7* 8.5* 8.3*  --   MG  --   --   --  1.6*     CBC: Recent Labs  Lab 02/15/17 1918  WBC 7.3  HGB 12.2*  HCT 38.1*  MCV 95.3  PLT 158      Lab Results  Component Value Date   HEPBSAG Negative 02/15/2017   HEPBSAB Reactive 02/15/2017      Microbiology:  Recent Results (from the past 240 hour(s))  MRSA PCR Screening     Status: None   Collection  Time: 02/15/17  9:34 PM  Result Value Ref Range Status   MRSA by PCR NEGATIVE NEGATIVE Final    Comment:        The GeneXpert MRSA Assay (FDA approved for NASAL specimens only), is one component of a comprehensive MRSA colonization surveillance program. It is not intended to diagnose MRSA infection nor to guide or monitor treatment for MRSA infections. Performed at [facility], [address] [place], [place] [address]     Coagulation Studies: No results for input(s): LABPROT, INR in the last 72 hours.  Urinalysis: No results for input(s): COLORURINE, LABSPEC, PHURINE, GLUCOSEU, HGBUR, BILIRUBINUR, KETONESUR, PROTEINUR, UROBILINOGEN, NITRITE, LEUKOCYTESUR in the last 72 hours.  Invalid input(s): APPERANCEUR    Imaging: Dg Chest Port 1 View  Result Date: 02/16/2017 CLINICAL DATA:  Shortness of Breath EXAM: PORTABLE CHEST 1 VIEW COMPARISON:  February 15, 2017 FINDINGS: There is stable cardiomegaly with pulmonary venous hypertension. There is mild left base atelectasis. There is no edema or consolidation. There is aortic atherosclerosis. Central catheter tip is in  the superior vena cava. No adenopathy. No bone lesions. No pneumothorax. IMPRESSION: Cardiomegaly with pulmonary vascular congestion. Mild left base atelectasis. No edema or consolidation. There is aortic atherosclerosis. Central catheter tip in superior vena cava. Aortic Atherosclerosis (ICD10-I70.0). Electronically Signed   By: Lowella Grip III M.D.   On: 02/16/2017 10:33   Dg Chest Portable 1 View  Result Date: 02/15/2017 CLINICAL DATA:  Shortness of breath, chest pain. EXAM: PORTABLE CHEST 1 VIEW COMPARISON:  Radiographs of November 20, 2016. FINDINGS: Stable cardiomegaly and central pulmonary vascular congestion is noted. Aortic atherosclerosis is noted. Stable position of right internal jugular catheter. No pneumothorax or pleural effusion is noted. Both lungs are clear. The visualized skeletal  structures are unremarkable. IMPRESSION: Stable cardiomegaly and central pulmonary vascular congestion. Aortic atherosclerosis. Electronically Signed   By: Marijo Conception, M.D.   On: 02/15/2017 19:34     Medications:   . sodium chloride     . atorvastatin  40 mg Oral Daily  . calcium acetate  1,334 mg Oral TID WC  . cinacalcet  90 mg Oral Daily  . furosemide  40 mg Oral BID  . heparin  5,000 Units Subcutaneous Q8H  . hydrALAZINE  100 mg Oral TID  . ofloxacin  1 drop Both Eyes QID  . sacubitril-valsartan  1 tablet Oral BID  . senna-docusate  1 tablet Oral QHS  . sodium chloride flush  3 mL Intravenous Q12H   sodium chloride, acetaminophen **OR** acetaminophen, albuterol, benzonatate, bisacodyl, diphenhydrAMINE, guaiFENesin-codeine, nitroGLYCERIN, ondansetron **OR** ondansetron (ZOFRAN) IV, sodium chloride flush  Assessment/ Plan:  61 y.o. African-American male with end stage renal disease on hemodialysis, hypertension, hepatitis C, anemia, seizure disorder, COPD/tobacco abuse, history of substance abuse,  secondary hyperparathyroidism  [facility] MWF [place]  1.  ESRD with volume overload and flash pulmonary edema Patient required BiPAP Total of 5.6 to 5.8 L of fluid has been removed.  Echo from 04/30/16 with EF of 20-25%.  Standing weight 71.8 kg  Approximate estimated dry weight adjusted to 72 kg    2. Anemia of chronic kidney disease: hemoglobin 12.2 - epo as outpatient.   3. Secondary Hyperparathyroidism: with hyperphosphatemia.   - cinalcet - calcium acetate with meals.      LOS: 2 [name] 12/27/20185:24 PM  [place], [place]

## 2017-02-18 ENCOUNTER — Telehealth: Payer: Self-pay | Admitting: Internal Medicine

## 2017-02-18 DIAGNOSIS — R001 Bradycardia, unspecified: Secondary | ICD-10-CM

## 2017-02-18 DIAGNOSIS — R0602 Shortness of breath: Secondary | ICD-10-CM

## 2017-02-18 DIAGNOSIS — J810 Acute pulmonary edema: Secondary | ICD-10-CM

## 2017-02-18 LAB — BASIC METABOLIC PANEL
ANION GAP: 9 (ref 5–15)
BUN: 32 mg/dL — ABNORMAL HIGH (ref 6–20)
CALCIUM: 8.1 mg/dL — AB (ref 8.9–10.3)
CO2: 27 mmol/L (ref 22–32)
Chloride: 99 mmol/L — ABNORMAL LOW (ref 101–111)
Creatinine, Ser: 5.91 mg/dL — ABNORMAL HIGH (ref 0.61–1.24)
GFR, EST AFRICAN AMERICAN: 11 mL/min — AB (ref >60)
GFR, EST NON AFRICAN AMERICAN: 9 mL/min — AB (ref >60)
Glucose, Bld: 103 mg/dL — ABNORMAL HIGH (ref 65–99)
Potassium: 4.6 mmol/L (ref 3.5–5.1)
Sodium: 135 mmol/L (ref 135–145)

## 2017-02-18 LAB — GLUCOSE, CAPILLARY: Glucose-Capillary: 67 mg/dL (ref 65–99)

## 2017-02-18 MED ORDER — MAGNESIUM OXIDE 400 (241.3 MG) MG PO TABS: 400.0000 mg | ORAL_TABLET | Freq: Every day | ORAL | Status: DC

## 2017-02-18 MED ORDER — FUROSEMIDE 40 MG PO TABS: 40.0000 mg | ORAL_TABLET | Freq: Two times a day (BID) | ORAL | 1 refills | Status: DC

## 2017-02-18 MED ORDER — ATORVASTATIN CALCIUM 40 MG PO TABS: 40.0000 mg | ORAL_TABLET | Freq: Every day | ORAL | 1 refills | Status: DC

## 2017-02-18 NOTE — Progress Notes (Signed)
HD Tx ended  

## 2017-02-18 NOTE — Discharge Instructions (Signed)
Renal diet

## 2017-02-18 NOTE — Discharge Summary (Signed)
[facility] [place] NAME: [name]    MR#:  [id number]  DATE OF BIRTH:  March 02, 1955  DATE OF ADMISSION:  02/15/2017   ADMITTING PHYSICIAN: Demetrios Loll, MD  DATE OF DISCHARGE: 02/18/2017  PRIMARY CARE PHYSICIAN: [facility] [place]   ADMISSION DIAGNOSIS:  Acute pulmonary edema ([place]) [J81.0] ESRD (end stage renal disease) on dialysis ([place]) [N18.6, Z99.2] DISCHARGE DIAGNOSIS:  Active Problems:   Acute respiratory failure with hypoxia ([place])  SECONDARY DIAGNOSIS:   Past Medical History:  Diagnosis Date  . Arthritis   . Chest pain    a. 09/2013 Myoview [facility]): basal inf defect more pronounced @ rest, likely artifact, EF 48%, low risk study.  . Chronic combined systolic and diastolic CHF (congestive heart failure) ([place])    a. 10/2013 Echo [facility]): EF 50%, diast dysfxn, triv AI/TR, mild MR;  b. 10/2014 Echo: EF 30-35%, diff HK, gr1 DD, mild AI, mild to mod MR/TR, mod dil LA, nl RV, mod to sev PAH;  c. 04/2016 Echo: EF 20-25%, sev dil LV, diff HK, gr3 DD, mod AI, MV syst bowing w/ prolapse, mod dil RV, mod TR.  . Diabetes ([place])   . Dialysis patient ([place])    Mon, Wed, Fri  . Dyspnea   . ESRD (end stage renal disease) ([place])    a. MWF dialysis @ [facility].  . Hepatitis    Patient is unsure type of Hepatitis he has  . Hypertension   . Hypertensive heart disease with CHF (congestive heart failure) ([place])   . Peripheral vascular disease ([place])   . Pulmonary hypertension ([place]) 04/2016  . Renal insufficiency    HOSPITAL COURSE:  Acute on chronic respiratory failure with hypoxia due to fluid overload and acute on chronic systolic and diastolic CHF ejection fraction 25%.  The patient got emergent hemodialysis, off BiPAP, off O2 [place]. Continue Lasix twice daily, continue Entresto, DuoNeb  Elevated troponin, due to demanding ischemia due to above.  Bradycardia.  Possible due to vasovagal, he had bradycardia at 30-40s  during bowel movement but improved later.  Resume [place] cardiologist  ESRD. Continue hemodialysis.  Hypertension.    Blood pressure is elevated. Continue home hypertension medication. Hypokalemia.  Improved.  DISCHARGE CONDITIONS:  Stable, discharge to home today. CONSULTS OBTAINED:  Treatment Team:  Wellington Hampshire, MD DRUG ALLERGIES:   Allergies  Allergen Reactions  . Sulfa Antibiotics Rash   DISCHARGE MEDICATIONS:   Allergies as of 02/18/2017      Reactions   Sulfa Antibiotics Rash      Medication List    STOP taking these medications   torsemide 20 MG tablet Commonly known as:  DEMADEX     TAKE these medications   acetaminophen 325 MG tablet Commonly known as:  TYLENOL Take 2 tablets (650 mg total) by mouth every 6 (six) hours as needed for mild pain (or Fever >/= 101). What changed:  how much to take   albuterol 108 (90 Base) MCG/ACT inhaler Commonly known as:  PROVENTIL HFA;VENTOLIN HFA Inhale into the lungs every 4 (four) hours as needed for wheezing or shortness of breath.   atorvastatin 40 MG tablet Commonly known as:  LIPITOR Take 1 tablet (40 mg total) by mouth daily. Start taking on:  02/19/2017   calcium acetate 667 MG capsule Commonly known as:  PHOSLO Take 1,334 mg by mouth 3 (three) times daily with meals. Take 2001 mgs with meals 3 times daily and 1334  with snacks   carvedilol 6.25 MG tablet Commonly known as:  COREG Take 1 tablet (6.25 mg total) by mouth 2 (two) times daily with a meal.   cinacalcet 90 MG tablet Commonly known as:  SENSIPAR Take 90 mg by mouth daily.   furosemide 40 MG tablet Commonly known as:  LASIX Take 1 tablet (40 mg total) by mouth 2 (two) times daily.   hydrALAZINE 50 MG tablet Commonly known as:  APRESOLINE Take 100 mg by mouth 3 (three) times daily.   ipratropium 17 MCG/ACT inhaler Commonly known as:  ATROVENT HFA Inhale 2 puffs into the lungs every 4 (four) hours as needed for wheezing.     isosorbide mononitrate 30 MG 24 hr tablet Commonly known as:  IMDUR Take 1 tablet (30 mg total) by mouth daily. What changed:  when to take this   lidocaine-prilocaine cream Commonly known as:  EMLA Apply 1 application topically as needed (dialysis access).   multivitamin Tabs tablet Take 1 tablet by mouth daily.   nitroGLYCERIN 0.4 MG SL tablet Commonly known as:  NITROSTAT Place 0.4 mg under the tongue every 5 (five) minutes as needed for chest pain.   sacubitril-valsartan 24-26 MG Commonly known as:  ENTRESTO Take 1 tablet by mouth 2 (two) times daily.        DISCHARGE INSTRUCTIONS:  See AVS. If you experience worsening of your admission symptoms, develop shortness of breath, life threatening emergency, suicidal or homicidal thoughts you must seek medical attention immediately by calling 911 or calling your MD immediately  if symptoms less severe.  You Must read complete instructions/literature along with all the possible adverse reactions/side effects for all the Medicines you take and that have been prescribed to you. Take any new Medicines after you have completely understood and accpet all the possible adverse reactions/side effects.   Please note  You were cared for by a hospitalist during your hospital stay. If you have any questions about your discharge medications or the care you received while you were in the hospital after you are discharged, you can call the unit and asked to speak with the hospitalist on call if the hospitalist that took care of you is not available. Once you are discharged, your primary care physician will handle any further medical issues. Please note that NO REFILLS for any discharge medications will be authorized once you are discharged, as it is imperative that you return to your primary care physician (or establish a relationship with a primary care physician if you do not have one) for your aftercare needs so that they can reassess your need  for medications and monitor your lab values.    On the day of Discharge:  VITAL SIGNS:  Blood pressure (!) 146/56, pulse (!) 56, temperature 98.4 F (36.9 C), temperature source Oral, resp. rate 12, height 5\' 8"  (1.727 m), weight 158 lb 4.6 oz (71.8 kg), SpO2 96 %. PHYSICAL EXAMINATION:  GENERAL:  61 y.o.-year-old patient lying in the bed with no acute distress.  EYES: Pupils equal, round, reactive to light and accommodation. No scleral icterus. Extraocular muscles intact.  HEENT: Head atraumatic, normocephalic. Oropharynx and nasopharynx clear.  NECK:  Supple, no jugular venous distention. No thyroid enlargement, no tenderness.  LUNGS: Normal breath sounds bilaterally, no wheezing, rales,rhonchi or crepitation. No use of accessory muscles of respiration.  CARDIOVASCULAR: S1, S2 normal. No murmurs, rubs, or gallops.  ABDOMEN: Soft, non-tender, non-distended. Bowel sounds present. No organomegaly or mass.  EXTREMITIES: No pedal edema, cyanosis,  or clubbing.  NEUROLOGIC: Cranial nerves II through XII are intact. Muscle strength 5/5 in all extremities. Sensation intact. Gait not checked.  PSYCHIATRIC: The patient is alert and oriented x 3.  SKIN: No obvious rash, lesion, or ulcer.  DATA REVIEW:   CBC Recent Labs  Lab 02/15/17 1918  WBC 7.3  HGB 12.2*  HCT 38.1*  PLT 158    Chemistries  Recent Labs  Lab 02/17/17 1058 02/18/17 0356  NA  --  135  K  --  4.6  CL  --  99*  CO2  --  27  GLUCOSE  --  103*  BUN  --  32*  CREATININE  --  5.91*  CALCIUM  --  8.1*  MG 1.6*  --      Microbiology Results  Results for orders placed or performed during the hospital encounter of 02/15/17  MRSA PCR Screening     Status: None   Collection Time: 02/15/17  9:34 PM  Result Value Ref Range Status   MRSA by PCR NEGATIVE NEGATIVE Final    Comment:        The GeneXpert MRSA Assay (FDA approved for NASAL specimens only), is one component of a comprehensive MRSA  colonization surveillance program. It is not intended to diagnose MRSA infection nor to guide or monitor treatment for MRSA infections. Performed at [facility], [address] [place], [place] [address]     RADIOLOGY:  No results found.   Management plans discussed with the patient, family and they are in agreement.  CODE STATUS: Full Code   TOTAL TIME TAKING CARE OF THIS PATIENT: 33 minutes.    Demetrios Loll M.D on 02/18/2017 at 1:49 PM  Between 7am to 6pm - Pager - [phone]  After 6pm go to [email or web address] - [employer] [facility]  Office  [phone]  CC: Primary care physician; [facility] [place]   Note: This dictation was prepared with Diplomatic Services operational officer dictation along with smaller phrase technology. Any transcriptional errors that result from this process are unintentional.

## 2017-02-18 NOTE — Telephone Encounter (Signed)
Admitted

## 2017-02-18 NOTE — Care Management (Signed)
Patient admitted for respiratory failure.  Patient has chronic O2 with [facility].  Patient did not have access for his portable tank at discharge.  Tank was delivered by [facility] from [facility].  Patient has previously been open with [facility] in the past.  Due to diagnosis, and frequent admissions RNCM presented patient with option for home health nursing.  Patient declined home health services at discharge.  Patient states "I have to many things going on during the week, I don't have time to be waiting on them".  [name] HD liaison notified of discharge. Patient has previously bene to the heart failure clinic.  Patient confirms that he has scales in the home.  RNCM signing off.

## 2017-02-18 NOTE — Progress Notes (Signed)
[facility], [place] 02/18/17  Subjective:    5.6 L of fluid has been removed with hemodialysis Feels overall better No acute c/o today   HEMODIALYSIS FLOWSHEET:  Blood Flow Rate (mL/min): 400 mL/min Arterial Pressure (mmHg): -200 mmHg Venous Pressure (mmHg): 160 mmHg Transmembrane Pressure (mmHg): 30 mmHg Ultrafiltration Rate (mL/min): 500 mL/min Dialysate Flow Rate (mL/min): 800 ml/min Conductivity: Machine : 14 Conductivity: Machine : 14 Dialysis Fluid Bolus: Normal Saline Bolus Amount (mL): 250 mL        Objective:  Vital signs in last 24 hours:  Temp:  [97.9 F (36.6 C)-99 F (37.2 C)] 98.4 F (36.9 C) (12/28 0930) Pulse Rate:  [36-81] 76 (12/28 0934) Resp:  [17-22] 22 (12/28 0934) BP: (100-145)/(60-87) 100/66 (12/28 0934) SpO2:  [96 %-100 %] 100 % (12/28 1200) Weight:  [71.8 kg (158 lb 4.6 oz)] 71.8 kg (158 lb 4.6 oz) (12/28 0934)  Weight change: 24.8 kg (54 lb 12.5 oz) Filed Weights   02/18/17 0500 02/18/17 0930 02/18/17 0934  Weight: 71.8 kg (158 lb 4.6 oz) 71.8 kg (158 lb 4.6 oz) 71.8 kg (158 lb 4.6 oz)    Intake/Output:    Intake/Output Summary (Last 24 hours) at 02/18/2017 1218 Last data filed at 02/18/2017 0541 Gross per 24 hour  Intake 583 ml  Output 100 ml  Net 483 ml     Physical Exam: General:  no acute distress  HEENT  anicteric  Neck  supple  Pulm/lungs  decreased breath sounds at bases, no crackles  CVS/Heart  no rub  Abdomen:   Soft, nontender  Extremities:  Trace edema  Neurologic:  Alert, oriented  Skin:  Vitiligo  Access:  Tunneled dialysis catheter       Basic Metabolic Panel:  Recent Labs  Lab 02/15/17 1918 02/16/17 0415 02/17/17 0359 02/17/17 1058 02/18/17 0356  NA 138 139 137  --  135  K 4.7 3.0* 4.4  --  4.6  CL 105 100* 100*  --  99*  CO2 18* 30 29  --  27  GLUCOSE 118* 65 142*  --  103*  BUN 62* 22* 18  --  32*  CREATININE 7.69* 3.31* 3.89*  --  5.91*  CALCIUM 8.7* 8.5*  8.3*  --  8.1*  MG  --   --   --  1.6*  --      CBC: Recent Labs  Lab 02/15/17 1918  WBC 7.3  HGB 12.2*  HCT 38.1*  MCV 95.3  PLT 158      Lab Results  Component Value Date   HEPBSAG Negative 02/15/2017   HEPBSAB Reactive 02/15/2017      Microbiology:  Recent Results (from the past 240 hour(s))  MRSA PCR Screening     Status: None   Collection Time: 02/15/17  9:34 PM  Result Value Ref Range Status   MRSA by PCR NEGATIVE NEGATIVE Final    Comment:        The GeneXpert MRSA Assay (FDA approved for NASAL specimens only), is one component of a comprehensive MRSA colonization surveillance program. It is not intended to diagnose MRSA infection nor to guide or monitor treatment for MRSA infections. Performed at [facility], [address] [place], [place] [address]     Coagulation Studies: No results for input(s): LABPROT, INR in the last 72 hours.  Urinalysis: No results for input(s): COLORURINE, LABSPEC, PHURINE, GLUCOSEU, HGBUR, BILIRUBINUR, KETONESUR, PROTEINUR, UROBILINOGEN, NITRITE, LEUKOCYTESUR in the last 72 hours.  Invalid input(s):  APPERANCEUR    Imaging: No results found.   Medications:   . sodium chloride     . atorvastatin  40 mg Oral Daily  . calcium acetate  1,334 mg Oral TID WC  . carvedilol  3.125 mg Oral BID WC  . cinacalcet  90 mg Oral Daily  . furosemide  40 mg Oral BID  . heparin  5,000 Units Subcutaneous Q8H  . hydrALAZINE  100 mg Oral TID  . magnesium oxide  400 mg Oral Daily  . ofloxacin  1 drop Both Eyes QID  . sacubitril-valsartan  1 tablet Oral BID  . senna-docusate  1 tablet Oral QHS  . sodium chloride flush  3 mL Intravenous Q12H   sodium chloride, acetaminophen **OR** acetaminophen, albuterol, benzonatate, bisacodyl, diphenhydrAMINE, guaiFENesin-codeine, nitroGLYCERIN, ondansetron **OR** ondansetron (ZOFRAN) IV, sodium chloride flush  Assessment/ Plan:  61 y.o. African-American male with end stage renal  disease on hemodialysis, hypertension, hepatitis C, anemia, seizure disorder, COPD/tobacco abuse, history of substance abuse,  secondary hyperparathyroidism  [facility] MWF [place]  1.  ESRD with volume overload and flash pulmonary edema Patient required BiPAP Total of 5.6 to 5.8 L of fluid has been removed.  Echo from 04/30/16 with EF of 20-25%.  Standing weight 71.8 kg  Outpatient Estimated dry weight adjusted to 72 kg     2. Anemia of chronic kidney disease: hemoglobin 12.2 - epo as outpatient.   3. Secondary Hyperparathyroidism: with hyperphosphatemia.   - cinalcet - calcium acetate with meals.      LOS: [place] 12/28/201812:18 PM  [facility] [place], [place]

## 2017-02-18 NOTE — Progress Notes (Signed)
Post HD assessment  

## 2017-02-18 NOTE — Progress Notes (Signed)
Pre Hd assessment

## 2017-02-18 NOTE — Progress Notes (Signed)
Post HD  

## 2017-02-18 NOTE — Progress Notes (Signed)
HD Tx Start  

## 2017-02-18 NOTE — Progress Notes (Signed)
Pre HD  

## 2017-02-18 NOTE — Telephone Encounter (Signed)
TCM....  Patient is being discharged   Patient of Dr. Saunders Revel   They are scheduled to see Sharolyn Douglas on 1/23 at 330 pm   They were seen for Bradycardia   They need to be seen within 2 wks  Pt added wait list

## 2017-02-18 NOTE — Care Management Important Message (Signed)
Important Message  Patient Details  Name: [name] MRN: [id number] Date of Birth: Nov 30, 1955   Medicare Important Message Given:  Yes    Beverly Sessions, RN 02/18/2017, 2:06 PM

## 2017-02-21 ENCOUNTER — Other Ambulatory Visit (INDEPENDENT_AMBULATORY_CARE_PROVIDER_SITE_OTHER): Payer: Self-pay | Admitting: Vascular Surgery

## 2017-02-21 ENCOUNTER — Other Ambulatory Visit: Payer: Self-pay | Admitting: *Deleted

## 2017-02-21 ENCOUNTER — Telehealth: Payer: Self-pay | Admitting: Internal Medicine

## 2017-02-21 DIAGNOSIS — N185 Chronic kidney disease, stage 5: Secondary | ICD-10-CM

## 2017-02-21 DIAGNOSIS — I493 Ventricular premature depolarization: Secondary | ICD-10-CM

## 2017-02-21 NOTE — Telephone Encounter (Signed)
Called to schedule patient from waitlist   Lmov

## 2017-02-21 NOTE — Telephone Encounter (Signed)
Per Consult note from Dr. Hillery Hunter, PA:  1.  Frequent PVCs and ventricular bigeminy: I suspect that the bradycardia was not really but was miscalculation due to frequent PVCs and decreased peripheral pulse.  I resumed small dose of carvedilol which should be uptitrated as tolerated. Frequent PVCs might be negatively affecting his cardiomyopathy.  Recommend 24-hour old Holter monitor to quantify the PVCs followed by EP consultation to consider ablation or antiarrhythmic medication.   I have placed the order for a 24 hour holter. Will forward to scheduling to contact the patient to arrange a 24 hour holter and follow up with Dr. Caryl Comes after.  We can then cancel the appointment with Ignacia Bayley, NP on 03/16/17.

## 2017-02-24 ENCOUNTER — Encounter (INDEPENDENT_AMBULATORY_CARE_PROVIDER_SITE_OTHER): Payer: Self-pay | Admitting: Vascular Surgery

## 2017-02-24 ENCOUNTER — Ambulatory Visit (INDEPENDENT_AMBULATORY_CARE_PROVIDER_SITE_OTHER): Payer: Medicare Other | Admitting: Vascular Surgery

## 2017-02-24 ENCOUNTER — Ambulatory Visit (INDEPENDENT_AMBULATORY_CARE_PROVIDER_SITE_OTHER): Payer: Medicare Other

## 2017-02-24 VITALS — BP 148/85 | HR 76 | Resp 18 | Ht 68.0 in | Wt 168.0 lb

## 2017-02-24 DIAGNOSIS — Z992 Dependence on renal dialysis: Secondary | ICD-10-CM | POA: Diagnosis not present

## 2017-02-24 DIAGNOSIS — T829XXS Unspecified complication of cardiac and vascular prosthetic device, implant and graft, sequela: Secondary | ICD-10-CM

## 2017-02-24 DIAGNOSIS — E1122 Type 2 diabetes mellitus with diabetic chronic kidney disease: Secondary | ICD-10-CM | POA: Diagnosis not present

## 2017-02-24 DIAGNOSIS — I10 Essential (primary) hypertension: Secondary | ICD-10-CM

## 2017-02-24 DIAGNOSIS — N186 End stage renal disease: Secondary | ICD-10-CM

## 2017-02-24 DIAGNOSIS — N185 Chronic kidney disease, stage 5: Secondary | ICD-10-CM

## 2017-02-25 ENCOUNTER — Encounter (INDEPENDENT_AMBULATORY_CARE_PROVIDER_SITE_OTHER): Payer: Self-pay | Admitting: Vascular Surgery

## 2017-02-25 ENCOUNTER — Encounter (INDEPENDENT_AMBULATORY_CARE_PROVIDER_SITE_OTHER): Payer: Self-pay

## 2017-02-25 NOTE — Progress Notes (Signed)
MRN : [id number]  [name] is a 62 y.o. ([birth year]) male who presents with chief complaint of  Chief Complaint  Patient presents with  . Follow-up    2 week [facility] f/u  .  History of Present Illness:  The patient is seen for evaluation of dialysis access.  The patient has a history of multiple failed accesses.  There have been accesses in both arms and in the thighs.    Current access is via a catheter which is functioning poorly.  There have not been any episodes of catheter infection.  He also notes that the aneurysmal nonfunctioning fistula is very tender he rates this pain as an 8 out of 10 at times particularly if he bumps into something.  He is requesting that it be removed.  The patient denies amaurosis fugax or recent TIA symptoms. There are no recent neurological changes noted. The patient denies claudication symptoms or rest pain symptoms. The patient denies history of DVT, PE or superficial thrombophlebitis. The patient denies recent episodes of angina or shortness of breath.    Current Meds  Medication Sig  . acetaminophen (TYLENOL) 325 MG tablet Take 2 tablets (650 mg total) by mouth every 6 (six) hours as needed for mild pain (or Fever >/= 101). (Patient taking differently: Take 500 mg by mouth every 6 (six) hours as needed for mild pain (or Fever >/= 101). )  . albuterol (PROVENTIL HFA;VENTOLIN HFA) 108 (90 Base) MCG/ACT inhaler Inhale into the lungs every 4 (four) hours as needed for wheezing or shortness of breath.  [name] aspirin 81 MG chewable tablet Chew 81 mg by mouth.  [name] atorvastatin (LIPITOR) 40 MG tablet Take 1 tablet (40 mg total) by mouth daily.  . calcium acetate (PHOSLO) 667 MG capsule Take 1,334 mg by mouth 3 (three) times daily with meals. Take 2001 mgs with meals 3 times daily and 1334 with snacks  . carvedilol (COREG) 6.25 MG tablet Take 1 tablet (6.25 mg total) by mouth 2 (two) times daily with a meal.  . cinacalcet (SENSIPAR) 90 MG tablet Take 90 mg  by mouth daily.  . furosemide (LASIX) 40 MG tablet Take 1 tablet (40 mg total) by mouth 2 (two) times daily.  . hydrALAZINE (APRESOLINE) 50 MG tablet Take 100 mg by mouth 3 (three) times daily.   [name] ipratropium (ATROVENT HFA) 17 MCG/ACT inhaler Inhale 2 puffs into the lungs every 4 (four) hours as needed for wheezing.  . isosorbide mononitrate (IMDUR) 30 MG 24 hr tablet Take 1 tablet (30 mg total) by mouth daily. (Patient taking differently: Take 30 mg by mouth 2 (two) times daily. )  . lidocaine-prilocaine (EMLA) cream Apply 1 application topically as needed (dialysis access).  . multivitamin (RENA-VIT) TABS tablet Take 1 tablet by mouth daily.  . nitroGLYCERIN (NITROSTAT) 0.4 MG SL tablet Place 0.4 mg under the tongue every 5 (five) minutes as needed for chest pain.  . sacubitril-valsartan (ENTRESTO) 24-26 MG Take 1 tablet by mouth 2 (two) times daily.  . [DISCONTINUED] calcium elemental as carbonate (BARIATRIC TUMS ULTRA) 400 MG chewable tablet Chew 2,000 mg by mouth.    Past Medical History:  Diagnosis Date  . Arthritis   . Chest pain    a. 09/2013 Myoview [facility]): basal inf defect more pronounced @ rest, likely artifact, EF 48%, low risk study.  . Chronic combined systolic and diastolic CHF (congestive heart failure) ([place])    a. 10/2013 Echo [facility]): EF 50%, diast dysfxn, triv AI/TR, mild  MR;  b. 10/2014 Echo: EF 30-35%, diff HK, gr1 DD, mild AI, mild to mod MR/TR, mod dil LA, nl RV, mod to sev PAH;  c. 04/2016 Echo: EF 20-25%, sev dil LV, diff HK, gr3 DD, mod AI, MV syst bowing w/ prolapse, mod dil RV, mod TR.  . Diabetes ([place])   . Dialysis patient ([place])    Mon, Wed, Fri  . Dyspnea   . ESRD (end stage renal disease) ([place])    a. MWF dialysis @ [facility].  . Hepatitis    Patient is unsure type of Hepatitis he has  . Hypertension   . Hypertensive heart disease with CHF (congestive heart failure) ([place])   . Peripheral vascular disease ([place])   . Pulmonary hypertension ([place]) 04/2016  .  Renal insufficiency     Past Surgical History:  Procedure Laterality Date  . A/V FISTULAGRAM Left 04/06/2016   Procedure: A/V Fistulagram;  Surgeon: Katha Cabal, MD;  Location: [place] [facility];  Service: Cardiovascular;  Laterality: Left;  . A/V FISTULAGRAM Left 01/19/2017   Procedure: A/V FISTULAGRAM;  Surgeon: Katha Cabal, MD;  Location: [place] [facility];  Service: Cardiovascular;  Laterality: Left;  . A/V SHUNT INTERVENTION N/A 04/06/2016   Procedure: A/V Shunt Intervention;  Surgeon: Katha Cabal, MD;  Location: [place] [facility];  Service: Cardiovascular;  Laterality: N/A;  . A/V SHUNT INTERVENTION N/A 06/29/2016   Procedure: A/V Shunt Intervention;  Surgeon: Katha Cabal, MD;  Location: [place] [facility];  Service: Cardiovascular;  Laterality: N/A;  . AV FISTULA PLACEMENT Left 2014  . DIALYSIS/PERMA CATHETER INSERTION Right    Dr. Delana Meyer  . PERIPHERAL VASCULAR CATHETERIZATION N/A 10/03/2014   Procedure: A/V Shuntogram/Fistulagram;  Surgeon: Algernon Huxley, MD;  Location: [place] [facility];  Service: Cardiovascular;  Laterality: N/A;  . PERIPHERAL VASCULAR CATHETERIZATION Left 10/03/2014   Procedure: A/V Shunt Intervention;  Surgeon: Algernon Huxley, MD;  Location: [place] [facility];  Service: Cardiovascular;  Laterality: Left;  . PERIPHERAL VASCULAR CATHETERIZATION Left 05/01/2015   Procedure: A/V Shuntogram/Fistulagram;  Surgeon: Algernon Huxley, MD;  Location: [place] [facility];  Service: Cardiovascular;  Laterality: Left;  . PERIPHERAL VASCULAR CATHETERIZATION N/A 05/01/2015   Procedure: A/V Shunt Intervention;  Surgeon: Algernon Huxley, MD;  Location: [place] [facility];  Service: Cardiovascular;  Laterality: N/A;  . UPPER EXTREMITY ANGIOGRAPHY Bilateral 06/29/2016   Procedure: Upper Extremity Angiography;  Surgeon: Katha Cabal, MD;  Location: [place] [facility];  Service: Cardiovascular;  Laterality: Bilateral;  . UPPER EXTREMITY INTERVENTION   06/29/2016   Procedure: Upper Extremity Intervention;  Surgeon: Katha Cabal, MD;  Location: [place] [facility];  Service: Cardiovascular;;    Social History Social History   Tobacco Use  . Smoking status: Former Smoker    Packs/day: 0.25    Years: 40.00    Pack years: 10.00    Types: Cigarettes    Last attempt to quit: 10/12/2016    Years since quitting: 0.3  . Smokeless tobacco: Never Used  Substance Use Topics  . Alcohol use: No    Alcohol/week: 0.0 oz  . Drug use: Yes    Frequency: 1.0 times per week    Types: Cocaine    Comment: 07/30/16 pt states last use 3-4 weeks ago    Family History Family History  Problem Relation Age of Onset  . Hypertension Son   . Diabetes Son   . Hypertension Mother   .  Diabetes Mother   . Diabetes Sister     Allergies  Allergen Reactions  . Sulfa Antibiotics Rash     REVIEW OF SYSTEMS (Negative unless checked)  Constitutional: [] Weight loss  [] Fever  [] Chills Cardiac: [] Chest pain   [] Chest pressure   [] Palpitations   [] Shortness of breath when laying flat   [] Shortness of breath with exertion. Vascular:  [] Pain in legs with walking   [] Pain in legs at rest  [] History of DVT   [] Phlebitis   [] Swelling in legs   [] Varicose veins   [] Non-healing ulcers Pulmonary:   [] Uses home oxygen   [] Productive cough   [] Hemoptysis   [] Wheeze  [] COPD   [] Asthma Neurologic:  [] Dizziness   [] Seizures   [] History of stroke   [] History of TIA  [] Aphasia   [] Vissual changes   [] Weakness or numbness in arm   [] Weakness or numbness in leg Musculoskeletal:   [] Joint swelling   [] Joint pain   [] Low back pain Hematologic:  [] Easy bruising  [] Easy bleeding   [] Hypercoagulable state   [] Anemic Gastrointestinal:  [] Diarrhea   [] Vomiting  [] Gastroesophageal reflux/heartburn   [] Difficulty swallowing. Genitourinary:  [x] Chronic kidney disease   [] Difficult urination  [] Frequent urination   [] Blood in urine Skin:  [] Rashes   [] Ulcers  Psychological:   [] History of anxiety   []  History of major depression.  Physical Examination  Vitals:   02/24/17 1541  BP: (!) 148/85  Pulse: 76  Resp: 18  Weight: 76.2 kg (168 lb)  Height: 5\' 8"  (1.727 m)   Body mass index is 25.54 kg/m. Gen: WD/WN, NAD Head: /AT, No temporalis wasting.  Ear/Nose/Throat: Hearing grossly intact, nares w/o erythema or drainage Eyes: PER, EOMI, sclera nonicteric.  Neck: Supple, no large masses.   Pulmonary:  Good air movement, no audible wheezing bilaterally, no use of accessory muscles.  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 No guarding/no peritoneal signs.  Musculoskeletal: M/S 5/5 throughout.  No deformity or atrophy.  Neurologic: CN 2-12 intact. Symmetrical.  Speech is fluent. Motor exam as listed above. Psychiatric: Judgment intact, Mood & affect appropriate for pt's clinical situation. Dermatologic: No rashes or ulcers noted.  No changes consistent with cellulitis. Lymph : No lichenification or skin changes of chronic lymphedema.  CBC Lab Results  Component Value Date   WBC 7.3 02/15/2017   HGB 12.2 (L) 02/15/2017   HCT 38.1 (L) 02/15/2017   MCV 95.3 02/15/2017   PLT 158 02/15/2017    BMET    Component Value Date/Time   NA 135 02/18/2017 0356   NA 139 04/30/2013 1203   K 4.6 02/18/2017 0356   K 4.4 04/30/2013 1203   CL 99 (L) 02/18/2017 0356   CL 107 04/30/2013 1203   CO2 27 02/18/2017 0356   CO2 25 04/30/2013 1203   GLUCOSE 103 (H) 02/18/2017 0356   GLUCOSE 104 (H) 04/30/2013 1203   BUN 32 (H) 02/18/2017 0356   BUN 76 (H) 04/30/2013 1203   CREATININE 5.91 (H) 02/18/2017 0356   CREATININE 9.49 (H) 04/30/2013 1203   CALCIUM 8.1 (L) 02/18/2017 0356   CALCIUM 8.0 (L) 04/30/2013  1203   GFRNONAA 9 (L) 02/18/2017 0356   GFRNONAA 5 (L) 04/30/2013 1203   GFRAA 11 (L) 02/18/2017 0356   GFRAA 6 (L)  04/30/2013 1203   Estimated Creatinine Clearance: 12.5 mL/min (A) (by C-G formula based on SCr of 5.91 mg/dL (H)).  COAG Lab Results  Component Value Date   INR 1.10 07/27/2016   INR 1.1 04/26/2013   INR 1.0 07/19/2012    Radiology Dg Chest Port 1 View  Result Date: 02/16/2017 CLINICAL DATA:  Shortness of Breath EXAM: PORTABLE CHEST 1 VIEW COMPARISON:  February 15, 2017 FINDINGS: There is stable cardiomegaly with pulmonary venous hypertension. There is mild left base atelectasis. There is no edema or consolidation. There is aortic atherosclerosis. Central catheter tip is in the superior vena cava. No adenopathy. No bone lesions. No pneumothorax. IMPRESSION: Cardiomegaly with pulmonary vascular congestion. Mild left base atelectasis. No edema or consolidation. There is aortic atherosclerosis. Central catheter tip in superior vena cava. Aortic Atherosclerosis (ICD10-I70.0). Electronically Signed   By: Lowella Grip III M.D.   On: 02/16/2017 10:33   Dg Chest Portable 1 View  Result Date: 02/15/2017 CLINICAL DATA:  Shortness of breath, chest pain. EXAM: PORTABLE CHEST 1 VIEW COMPARISON:  Radiographs of November 20, 2016. FINDINGS: Stable cardiomegaly and central pulmonary vascular congestion is noted. Aortic atherosclerosis is noted. Stable position of right internal jugular catheter. No pneumothorax or pleural effusion is noted. Both lungs are clear. The visualized skeletal structures are unremarkable. IMPRESSION: Stable cardiomegaly and central pulmonary vascular congestion. Aortic atherosclerosis. Electronically Signed   By: Marijo Conception, M.D.   On: 02/15/2017 19:34     Assessment/Plan 1. Complication from renal dialysis device, sequela Recommend:  The patient is experiencing increasing problems with their dialysis access.  His catheter is suboptimal.  I have  reviewed the images from his most recent angiogram.  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  This lesion is amenable to stenting.  It also shows a large axillary vein which would be acceptable for a graft.  I have discussed the plan with the patient he should undergo angiography with stenting of his central venous stenosis once this has been successfully achieved then preparations will be made for a brachial axillary graft placement on the left.  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  The risks, benefits and alternative therapies were reviewed in detail with the patient.  All questions were answered.  The patient agrees to proceed with angio/intervention.     A total of 30 minutes was spent with this patient and greater than 50% was spent in counseling and coordination of care with the patient.  Discussion included the treatment options for vascular disease including indications for surgery and intervention.  Also discussed is the appropriate timing of treatment.  In addition medical therapy was discussed.  2. ESRD (end stage renal disease) on dialysis [facility]) Continue dialysis without interruption via the catheter for now.  3. Type 2 diabetes mellitus with chronic kidney disease on chronic dialysis, without long-term current use of insulin (HCC) Continue hypoglycemic medications as already ordered, these medications have been reviewed and there are no changes at this time.  Hgb A1C to be monitored as already arranged by primary service   4. Essential hypertension Continue antihypertensive medications as already ordered, these medications have been reviewed and there are no changes at this time.     Hortencia Pilar, MD  02/25/2017 9:35 PM

## 2017-03-01 ENCOUNTER — Ambulatory Visit (INDEPENDENT_AMBULATORY_CARE_PROVIDER_SITE_OTHER): Payer: Medicare Other

## 2017-03-01 DIAGNOSIS — I493 Ventricular premature depolarization: Secondary | ICD-10-CM

## 2017-03-02 ENCOUNTER — Other Ambulatory Visit (INDEPENDENT_AMBULATORY_CARE_PROVIDER_SITE_OTHER): Payer: Self-pay | Admitting: Vascular Surgery

## 2017-03-04 ENCOUNTER — Telehealth: Payer: Self-pay | Admitting: Internal Medicine

## 2017-03-04 NOTE — Telephone Encounter (Signed)
L mom to call and schedule NP appt with Dr. Caryl Comes per Dr. Fletcher Anon for PVCs.

## 2017-03-07 ENCOUNTER — Ambulatory Visit
Admission: RE | Admit: 2017-03-07 | Discharge: 2017-03-07 | Disposition: A | Discharge status: Home / Self Care | Payer: Medicare Other | Source: Ambulatory Visit | Attending: Cardiovascular Disease | Admitting: Cardiovascular Disease

## 2017-03-07 DIAGNOSIS — I493 Ventricular premature depolarization: Secondary | ICD-10-CM | POA: Diagnosis not present

## 2017-03-13 ENCOUNTER — Other Ambulatory Visit: Payer: Self-pay

## 2017-03-13 ENCOUNTER — Emergency Department: Payer: Medicare Other

## 2017-03-13 ENCOUNTER — Inpatient Hospital Stay
Admission: EM | Admit: 2017-03-13 | Discharge: 2017-03-14 | DRG: 291 | Disposition: A | Discharge status: Home / Self Care | Payer: Medicare Other | Attending: Internal Medicine | Admitting: Internal Medicine

## 2017-03-13 DIAGNOSIS — I493 Ventricular premature depolarization: Secondary | ICD-10-CM | POA: Diagnosis present

## 2017-03-13 DIAGNOSIS — J441 Chronic obstructive pulmonary disease with (acute) exacerbation: Secondary | ICD-10-CM | POA: Diagnosis not present

## 2017-03-13 DIAGNOSIS — N186 End stage renal disease: Secondary | ICD-10-CM | POA: Diagnosis present

## 2017-03-13 DIAGNOSIS — I700 Atherosclerosis of aorta: Secondary | ICD-10-CM | POA: Diagnosis present

## 2017-03-13 DIAGNOSIS — I248 Other forms of acute ischemic heart disease: Secondary | ICD-10-CM | POA: Diagnosis present

## 2017-03-13 DIAGNOSIS — R000 Tachycardia, unspecified: Secondary | ICD-10-CM | POA: Diagnosis present

## 2017-03-13 DIAGNOSIS — I428 Other cardiomyopathies: Secondary | ICD-10-CM | POA: Diagnosis present

## 2017-03-13 DIAGNOSIS — Z992 Dependence on renal dialysis: Secondary | ICD-10-CM | POA: Diagnosis not present

## 2017-03-13 DIAGNOSIS — Z8249 Family history of ischemic heart disease and other diseases of the circulatory system: Secondary | ICD-10-CM | POA: Diagnosis not present

## 2017-03-13 DIAGNOSIS — R0603 Acute respiratory distress: Secondary | ICD-10-CM

## 2017-03-13 DIAGNOSIS — Z87891 Personal history of nicotine dependence: Secondary | ICD-10-CM | POA: Diagnosis not present

## 2017-03-13 DIAGNOSIS — E1122 Type 2 diabetes mellitus with diabetic chronic kidney disease: Secondary | ICD-10-CM | POA: Diagnosis present

## 2017-03-13 DIAGNOSIS — Z833 Family history of diabetes mellitus: Secondary | ICD-10-CM

## 2017-03-13 DIAGNOSIS — M1990 Unspecified osteoarthritis, unspecified site: Secondary | ICD-10-CM | POA: Diagnosis present

## 2017-03-13 DIAGNOSIS — I272 Pulmonary hypertension, unspecified: Secondary | ICD-10-CM | POA: Diagnosis present

## 2017-03-13 DIAGNOSIS — I5043 Acute on chronic combined systolic (congestive) and diastolic (congestive) heart failure: Secondary | ICD-10-CM | POA: Diagnosis not present

## 2017-03-13 DIAGNOSIS — I2510 Atherosclerotic heart disease of native coronary artery without angina pectoris: Secondary | ICD-10-CM | POA: Diagnosis not present

## 2017-03-13 DIAGNOSIS — K759 Inflammatory liver disease, unspecified: Secondary | ICD-10-CM | POA: Diagnosis not present

## 2017-03-13 DIAGNOSIS — Z7982 Long term (current) use of aspirin: Secondary | ICD-10-CM | POA: Diagnosis not present

## 2017-03-13 DIAGNOSIS — I132 Hypertensive heart and chronic kidney disease with heart failure and with stage 5 chronic kidney disease, or end stage renal disease: Secondary | ICD-10-CM | POA: Diagnosis not present

## 2017-03-13 DIAGNOSIS — R008 Other abnormalities of heart beat: Secondary | ICD-10-CM | POA: Diagnosis not present

## 2017-03-13 DIAGNOSIS — Z9981 Dependence on supplemental oxygen: Secondary | ICD-10-CM

## 2017-03-13 DIAGNOSIS — Z882 Allergy status to sulfonamides status: Secondary | ICD-10-CM | POA: Diagnosis not present

## 2017-03-13 DIAGNOSIS — I351 Nonrheumatic aortic (valve) insufficiency: Secondary | ICD-10-CM | POA: Diagnosis present

## 2017-03-13 DIAGNOSIS — J9621 Acute and chronic respiratory failure with hypoxia: Secondary | ICD-10-CM | POA: Diagnosis present

## 2017-03-13 DIAGNOSIS — R079 Chest pain, unspecified: Secondary | ICD-10-CM

## 2017-03-13 LAB — CBC
HEMATOCRIT: 37.6 % — AB (ref 40.0–52.0)
HEMOGLOBIN: 12 g/dL — AB (ref 13.0–18.0)
MCH: 30.2 pg (ref 26.0–34.0)
MCHC: 32 g/dL (ref 32.0–36.0)
MCV: 94.6 fL (ref 80.0–100.0)
Platelets: 132 10*3/uL — ABNORMAL LOW (ref 150–440)
RBC: 3.97 MIL/uL — ABNORMAL LOW (ref 4.40–5.90)
RDW: 18.1 % — AB (ref 11.5–14.5)
WBC: 6.4 10*3/uL (ref 3.8–10.6)

## 2017-03-13 LAB — BRAIN NATRIURETIC PEPTIDE: B Natriuretic Peptide: 4082 pg/mL — ABNORMAL HIGH (ref 0.0–100.0)

## 2017-03-13 LAB — COMPREHENSIVE METABOLIC PANEL
ALK PHOS: 98 U/L (ref 38–126)
ALT: 34 U/L (ref 17–63)
ANION GAP: 18 — AB (ref 5–15)
AST: 32 U/L (ref 15–41)
Albumin: 3.9 g/dL (ref 3.5–5.0)
BILIRUBIN TOTAL: 0.9 mg/dL (ref 0.3–1.2)
BUN: 80 mg/dL — ABNORMAL HIGH (ref 6–20)
CO2: 21 mmol/L — ABNORMAL LOW (ref 22–32)
Calcium: 9 mg/dL (ref 8.9–10.3)
Chloride: 100 mmol/L — ABNORMAL LOW (ref 101–111)
Creatinine, Ser: 7.74 mg/dL — ABNORMAL HIGH (ref 0.61–1.24)
GFR, EST AFRICAN AMERICAN: 8 mL/min — AB (ref >60)
GFR, EST NON AFRICAN AMERICAN: 7 mL/min — AB (ref >60)
GLUCOSE: 133 mg/dL — AB (ref 65–99)
POTASSIUM: 5 mmol/L (ref 3.5–5.1)
Sodium: 139 mmol/L (ref 135–145)
TOTAL PROTEIN: 8.5 g/dL — AB (ref 6.5–8.1)

## 2017-03-13 LAB — TROPONIN I: Troponin I: 0.19 ng/mL (ref <0.03)

## 2017-03-13 MED ORDER — IPRATROPIUM-ALBUTEROL 0.5-2.5 (3) MG/3ML IN SOLN
3.0000 mL | Freq: Once | RESPIRATORY_TRACT | Status: AC
  Administered 2017-03-13: 3 mL via RESPIRATORY_TRACT
  Filled 2017-03-13: qty 6

## 2017-03-13 MED ORDER — ACETAMINOPHEN 500 MG PO TABS: 1000.0000 mg | ORAL_TABLET | Freq: Once | ORAL | Status: DC

## 2017-03-13 MED ORDER — NITROGLYCERIN 0.4 MG SL SUBL
0.4000 mg | SUBLINGUAL_TABLET | SUBLINGUAL | Status: DC | PRN
  Administered 2017-03-13 (×2): 0.4 mg via SUBLINGUAL

## 2017-03-13 MED ORDER — FUROSEMIDE 10 MG/ML IJ SOLN
40.0000 mg | Freq: Once | INTRAMUSCULAR | Status: AC
  Administered 2017-03-13: 40 mg via INTRAVENOUS
  Filled 2017-03-13: qty 4

## 2017-03-13 MED ORDER — IPRATROPIUM-ALBUTEROL 0.5-2.5 (3) MG/3ML IN SOLN
3.0000 mL | Freq: Once | RESPIRATORY_TRACT | Status: AC
  Administered 2017-03-13: 3 mL via RESPIRATORY_TRACT

## 2017-03-13 MED ORDER — METHYLPREDNISOLONE SODIUM SUCC 125 MG IJ SOLR
125.0000 mg | Freq: Once | INTRAMUSCULAR | Status: AC
  Administered 2017-03-13: 125 mg via INTRAVENOUS
  Filled 2017-03-13: qty 2

## 2017-03-13 NOTE — Progress Notes (Signed)
Post HD assessment  

## 2017-03-13 NOTE — ED Notes (Signed)
Call received from dialysis rn, pt is requesting tylenol.

## 2017-03-13 NOTE — ED Notes (Signed)
Pt requesting O2 at this time to help with breathing.  Pt placed on 2LNC.  Pt's breathing less labored now.

## 2017-03-13 NOTE — Progress Notes (Signed)
[facility], [place] 03/13/17  Subjective:   Patient presents to the emergency room via EMS for shortness of breath.  His last treatment was on Friday.  Currently he is requiring increased oxygen to maintain his saturation.  He has some lower extremity edema.  Blood pressure is elevated at 173/67 Evaluation requested by the emergency room for urgent dialysis for acute shortness of breath  Objective:  Vital signs in last 24 hours:  Temp:  [97.5 F (36.4 C)] 97.5 F (36.4 C) (01/20 1512) Pulse Rate:  [86] 86 (01/20 1512) Resp:  [18] 18 (01/20 1512) BP: (173)/(67) 173/67 (01/20 1518) SpO2:  [98 %] 98 % (01/20 1512) Weight:  [76.2 kg (168 lb)] 76.2 kg (168 lb) (01/20 1515)  Weight change:  Filed Weights   03/13/17 1515  Weight: 76.2 kg (168 lb)    Intake/Output:   No intake or output data in the 24 hours ending 03/13/17 1706   Physical Exam: General:  Mild distress sitting up in the bed  HEENT  nebulizer mask in place  Neck  distended neck veins  Pulm/lungs  mild diffuse crackles  CVS/Heart  regular, no rub or gallop  Abdomen:   Soft, nontender  Extremities:  1+ pitting edema bilaterally  Neurologic:  Alert, oriented  Skin:  Dry skin  Access:  IJ CVC       Basic Metabolic Panel:  Recent Labs  Lab 03/13/17 1514  NA 139  K 5.0  CL 100*  CO2 21*  GLUCOSE 133*  BUN 80*  CREATININE 7.74*  CALCIUM 9.0     CBC: Recent Labs  Lab 03/13/17 1514  WBC 6.4  HGB 12.0*  HCT 37.6*  MCV 94.6  PLT 132*      Lab Results  Component Value Date   HEPBSAG Negative 02/15/2017   HEPBSAB Reactive 02/15/2017      Microbiology:  No results found for this or any previous visit (from the past 240 hour(s)).  Coagulation Studies: No results for input(s): LABPROT, INR in the last 72 hours.  Urinalysis: No results for input(s): COLORURINE, LABSPEC, PHURINE, GLUCOSEU, HGBUR, BILIRUBINUR, KETONESUR, PROTEINUR, UROBILINOGEN, NITRITE,  LEUKOCYTESUR in the last 72 hours.  Invalid input(s): APPERANCEUR    Imaging: Dg Chest 2 View  Result Date: 03/13/2017 CLINICAL DATA:  Cough.  Shortness of breath. EXAM: CHEST  2 VIEW COMPARISON:  January 27, 2017 FINDINGS: A double lumen right central line is identified. No pneumothorax. Stable cardiomegaly. The hila and mediastinum are unremarkable. No pulmonary nodules or masses. No focal infiltrates. No overt edema. IMPRESSION: No active cardiopulmonary disease. Electronically Signed   By: Dorise Bullion III M.D   On: 03/13/2017 15:50     Medications:       Assessment/ Plan:  62 y.o. male withend stage renal disease on hemodialysis, hypertension, hepatitis C, anemia, seizure disorder, COPD/tobacco abuse,history of substance abuse,secondary hyperparathyroidism, chronic systolic congestive heart failure with EF of 20-25%, moderate mitral regurgitation, moderate tricuspid regurgitation, severe pulmonary hypertension with four-chamber dilatation of the heart  [facility] MWF [address] permcath  1.  Acute shortness of breath likely combination of COPD exacerbation and volume overload from pulmonary edema 2.  Hypertension 3.  End-stage renal disease 4.  Acute exacerbation of chronic systolic congestive heart failure  Plan: Patient appears to be volume overload.  He has mildly elevated troponin.  His oxygen requirement has increased.  He also has hypertension and lower extremity edema.  We will perform urgent hemodialysis for volume removal  and re-evaluate    LOS: 0 Sabeen Piechocki [facility] 1/20/20195:06 PM  [facility] [place], [place]

## 2017-03-13 NOTE — Progress Notes (Signed)
HD tx start 

## 2017-03-13 NOTE — ED Notes (Signed)
Dialysis rn called to report pt is ready to return to ed. Dialysis rn states she has called a rapid response due to tachycardia and chest pain pt has developed during dialysis. Pt received 2 ntg, and now states his chest pain is 2/10 from 9/10. Pt states he does have improved "shortness of breath". Pt is requesting sandwich tray and po fluids. md consents to both. Dr. Cherylann Banas down to dialysis due to rapid response. Pt appears improved per dr. Cherylann Banas, pt transported back to ed room 9.

## 2017-03-13 NOTE — ED Notes (Signed)
Date and time results received: 03/13/17 1540  Test: Trop Critical Value: 0.19  Name of Provider Notified: Dr. Cherylann Banas  Orders Received? Or Actions Taken?: Acknowledged.

## 2017-03-13 NOTE — Progress Notes (Signed)
Pre HD assessment  

## 2017-03-13 NOTE — Progress Notes (Signed)
3609 mls removed during HD tx.

## 2017-03-13 NOTE — ED Provider Notes (Signed)
[facility] Emergency Department Provider Note ____________________________________________   First MD Initiated Contact with Patient 03/13/17 1523     (approximate)  I have reviewed the triage vital signs and the nursing notes.   HISTORY  Chief Complaint Shortness of Breath    HPI [name] is a 62 y.o. 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  He states that it has happened in the past that he has been not able to to make it to his next dialysis session because he has become fluid overloaded too soon.  He states he is compliant with his medications.  Past Medical History:  Diagnosis Date  . Arthritis   . Chest pain    a. 09/2013 Myoview [facility]): basal inf defect more pronounced @ rest, likely artifact, EF 48%, low risk study.  . Chronic combined systolic and diastolic CHF (congestive heart failure) ([place])    a. 10/2013 Echo [facility]): EF 50%, diast dysfxn, triv AI/TR, mild MR;  b. 10/2014 Echo: EF 30-35%, diff HK, gr1 DD, mild AI, mild to mod MR/TR, mod dil LA, nl RV, mod to sev PAH;  c. 04/2016 Echo: EF 20-25%, sev dil LV, diff HK, gr3 DD, mod AI, MV syst bowing w/ prolapse, mod dil RV, mod TR.  . Diabetes ([place])   . Dialysis patient ([place])    Mon, Wed, Fri  . Dyspnea   . ESRD (end stage renal disease) ([place])    a. MWF dialysis @ [facility].  . Hepatitis    Patient is unsure type of Hepatitis he has  . Hypertension   . Hypertensive heart disease with CHF (congestive heart failure) ([place])   . Peripheral vascular disease ([place])   . Pulmonary hypertension ([place]) 04/2016  . Renal insufficiency     Patient Active Problem List   Diagnosis Date Noted  . Acute respiratory failure with hypoxia ([place])  02/15/2017  . Chronic systolic heart failure ([place]) 09/08/2016  . 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 Hyperkalemia 07/04/2016  . Abnormal EKG 07/04/2016  . Elevated troponin   . Chest pain 05/22/2016  . Sepsis ([place]) 02/26/2016  . HCAP (healthcare-associated pneumonia) 02/26/2016  . Diabetes ([place]) 02/26/2016  . Community acquired pneumonia 01/07/2016  . Renal dialysis device, implant, or graft complication 90/24/0973  . Tobacco use 09/04/2015  . ESRD (end stage renal disease) on dialysis ([place]) 08/02/2015  . HTN (hypertension) 08/02/2015  . Pulmonary edema 12/02/2014    Past Surgical History:  Procedure Laterality Date  . A/V FISTULAGRAM Left 04/06/2016   Procedure: A/V Fistulagram;  Surgeon: Katha Cabal, MD;  Location: [place] [facility];  Service: Cardiovascular;  Laterality: Left;  . A/V FISTULAGRAM Left 01/19/2017   Procedure: A/V FISTULAGRAM;  Surgeon: Katha Cabal, MD;  Location: [place] [facility];  Service: Cardiovascular;  Laterality: Left;  . A/V SHUNT INTERVENTION N/A 04/06/2016   Procedure: A/V Shunt Intervention;  Surgeon: Katha Cabal, MD;  Location: [place] [facility];  Service: Cardiovascular;  Laterality: N/A;  . A/V SHUNT INTERVENTION N/A 06/29/2016   Procedure: A/V Shunt Intervention;  Surgeon: Katha Cabal, MD;  Location: [place] [facility];  Service: Cardiovascular;  Laterality: N/A;  . AV FISTULA PLACEMENT Left 2014  . DIALYSIS/PERMA CATHETER INSERTION Right  Dr. Delana Meyer  . PERIPHERAL VASCULAR CATHETERIZATION N/A 10/03/2014   Procedure: A/V Shuntogram/Fistulagram;  Surgeon: Algernon Huxley, MD;  Location: [place] [facility];  Service: Cardiovascular;  Laterality: N/A;  . PERIPHERAL VASCULAR CATHETERIZATION Left 10/03/2014   Procedure: A/V Shunt Intervention;  Surgeon: Algernon Huxley, MD;  Location: [place] [facility];  Service: Cardiovascular;  Laterality: Left;  . PERIPHERAL VASCULAR CATHETERIZATION Left 05/01/2015   Procedure: A/V  Shuntogram/Fistulagram;  Surgeon: Algernon Huxley, MD;  Location: [place] [facility];  Service: Cardiovascular;  Laterality: Left;  . PERIPHERAL VASCULAR CATHETERIZATION N/A 05/01/2015   Procedure: A/V Shunt Intervention;  Surgeon: Algernon Huxley, MD;  Location: [place] [facility];  Service: Cardiovascular;  Laterality: N/A;  . UPPER EXTREMITY ANGIOGRAPHY Bilateral 06/29/2016   Procedure: Upper Extremity Angiography;  Surgeon: Katha Cabal, MD;  Location: [place] [facility];  Service: Cardiovascular;  Laterality: Bilateral;  . UPPER EXTREMITY INTERVENTION  06/29/2016   Procedure: Upper Extremity Intervention;  Surgeon: Katha Cabal, MD;  Location: [place] [facility];  Service: Cardiovascular;;    Prior to Admission medications   Medication Sig Start Date End Date Taking? Authorizing Provider  acetaminophen (TYLENOL) 325 MG tablet Take 2 tablets (650 mg total) by mouth every 6 (six) hours as needed for mild pain (or Fever >/= 101). Patient taking differently: Take 500 mg by mouth every 6 (six) hours as needed for mild pain (or Fever >/= 101).  09/21/16  Yes Gouru, Aruna, MD  albuterol (PROVENTIL HFA;VENTOLIN HFA) 108 (90 Base) MCG/ACT inhaler Inhale into the lungs every 4 (four) hours as needed for wheezing or shortness of breath.   Yes [provider]  aspirin 81 MG chewable tablet Chew 81 mg by mouth. 07/21/16  Yes [provider]  atorvastatin (LIPITOR) 40 MG tablet Take 1 tablet (40 mg total) by mouth daily. 02/19/17  Yes Demetrios Loll, MD  calcium acetate (PHOSLO) 667 MG capsule Take 1,334 mg by mouth 3 (three) times daily with meals. Take 2001 mgs with meals 3 times daily and 1334 with snacks 12/11/15  Yes [provider]  carvedilol (COREG) 6.25 MG tablet Take 1 tablet (6.25 mg total) by mouth 2 (two) times daily with a meal. 09/21/16  Yes Gouru, Aruna, MD  cinacalcet (SENSIPAR) 90 MG tablet Take 90 mg by mouth daily.   Yes [provider]  furosemide  (LASIX) 40 MG tablet Take 1 tablet (40 mg total) by mouth 2 (two) times daily. 02/18/17  Yes Demetrios Loll, MD  hydrALAZINE (APRESOLINE) 50 MG tablet Take 100 mg by mouth 3 (three) times daily.  07/01/16  Yes [provider]  ipratropium (ATROVENT HFA) 17 MCG/ACT inhaler Inhale 2 puffs into the lungs every 4 (four) hours as needed for wheezing.   Yes [provider]  isosorbide mononitrate (IMDUR) 30 MG 24 hr tablet Take 1 tablet (30 mg total) by mouth daily. Patient taking differently: Take 30 mg by mouth 2 (two) times daily.  08/24/16 02/17/18 Yes Dustin Flock, MD  lidocaine-prilocaine (EMLA) cream Apply 1 application topically as needed (dialysis access).   Yes [provider]  multivitamin (RENA-VIT) TABS tablet Take 1 tablet by mouth daily.   Yes [provider]  nitroGLYCERIN (NITROSTAT) 0.4 MG SL tablet Place 0.4 mg under the tongue every 5 (five) minutes as needed for chest pain.   Yes [provider]  sacubitril-valsartan (ENTRESTO) 24-26 MG Take 1 tablet by mouth 2 (two) times daily. 10/05/16  Yes Hackney,  Aura Fey, FNP    Allergies Sulfa antibiotics  Family History  Problem Relation Age of Onset  . Hypertension Son   . Diabetes Son   . Hypertension Mother   . Diabetes Mother   . Diabetes Sister     Social History Social History   Tobacco Use  . Smoking status: Former Smoker    Packs/day: 0.25    Years: 40.00    Pack years: 10.00    Types: Cigarettes    Last attempt to quit: 10/12/2016    Years since quitting: 0.4  . Smokeless tobacco: Never Used  Substance Use Topics  . Alcohol use: No    Alcohol/week: 0.0 oz  . Drug use: Yes    Frequency: 1.0 times per week    Types: Cocaine    Comment: 07/30/16 pt states last use 3-4 weeks ago    Review of Systems  Constitutional: No fever. Eyes: No redness. ENT: No sore throat. Cardiovascular: Positive for chest tightness. Respiratory: Positive for shortness of  breath. Gastrointestinal: No vomiting Genitourinary: Negative for dysuria.  Musculoskeletal: Negative for back pain. Skin: Negative for rash. Neurological: Negative for headache.   ____________________________________________   PHYSICAL EXAM:  VITAL SIGNS: ED Triage Vitals  Enc Vitals Group     BP 03/13/17 1518 (!) 173/67     Pulse Rate 03/13/17 1512 86     Resp 03/13/17 1512 18     Temp 03/13/17 1512 (!) 97.5 F (36.4 C)     Temp Source 03/13/17 1512 Oral     SpO2 03/13/17 1512 98 %     Weight 03/13/17 1515 168 lb (76.2 kg)     Height --      Head Circumference --      Peak Flow --      Pain Score --      Pain Loc --      Pain Edu? --      Excl. in [place]? --     Constitutional: Alert and oriented.  Slightly uncomfortable appearing but in no acute distress. Eyes: Conjunctivae are normal.  Head: Atraumatic. Nose: No congestion/rhinnorhea. Mouth/Throat: Mucous membranes are moist.   Neck: Normal range of motion.  Cardiovascular: Normal rate, regular rhythm. Grossly normal heart sounds.  Good peripheral circulation. Respiratory: Normal respiratory effort.  No retractions.  Slightly decreased breath sounds bilaterally with no significant rales. Gastrointestinal: No distention.  Genitourinary: No CVA tenderness. Musculoskeletal: No lower extremity edema.   Neurologic:  Normal speech and language. No gross focal neurologic deficits are appreciated.  Skin:  Skin is warm and dry. No rash noted. Psychiatric: Mood and affect are normal. Speech and behavior are normal.  ____________________________________________   LABS (all labs ordered are listed, but only abnormal results are displayed)  Labs Reviewed  CBC - Abnormal; Notable for the following components:      Result Value   RBC 3.97 (*)    Hemoglobin 12.0 (*)    HCT 37.6 (*)    RDW 18.1 (*)    Platelets 132 (*)    All other components within normal limits  COMPREHENSIVE METABOLIC PANEL - Abnormal; Notable for the  following components:   Chloride 100 (*)    CO2 21 (*)    Glucose, Bld 133 (*)    BUN 80 (*)    Creatinine, Ser 7.74 (*)    Total Protein 8.5 (*)    GFR calc non Af Amer 7 (*)    GFR calc Af Amer 8 (*)  Anion gap 18 (*)    All other components within normal limits  TROPONIN I - Abnormal; Notable for the following components:   Troponin I 0.19 (*)    All other components within normal limits  BRAIN NATRIURETIC PEPTIDE - Abnormal; Notable for the following components:   B Natriuretic Peptide 4,082.0 (*)    All other components within normal limits   ____________________________________________  EKG  ED ECG REPORT I, Arta Silence, the attending physician, personally viewed and interpreted this ECG.  Date: 03/13/2017 EKG Time: 1511 Rate: 98 Rhythm: Sinus rhythm with bigeminy QRS Axis: normal Intervals: normal ST/T Wave abnormalities: LVH with repolarization abnormality; no peaked T waves Narrative Interpretation: no evidence of acute ischemia  ED ECG REPORT I, Arta Silence, the attending physician, personally viewed and interpreted this ECG.  Date: 03/13/2017 EKG Time: 2150 Rate: 119 Rhythm: Tachycardia with bigeminy QRS Axis: normal Intervals: normal ST/T Wave abnormalities: LVH with repolarization abnormality Narrative Interpretation: no evidence of acute ischemia; other than rate, no significant change when compared to EKG of 1511 today.  ____________________________________________  RADIOLOGY  CXR: No significant acute pulmonary edema  ____________________________________________   PROCEDURES  Procedure(s) performed: No    Critical Care performed: No ____________________________________________   INITIAL IMPRESSION / ASSESSMENT AND PLAN / ED COURSE  Pertinent labs & imaging results that were available during my care of the patient were reviewed by me and considered in my medical decision making (see chart for details).  62 year old  male with past medical history as noted above presents with increasing shortness of breath and chest tightness, feeling similar to when he has been fluid overloaded in the past.  Patient gets M/W/F dialysis and last had a 2 days ago, but he states is not the first time he is felt this way prior to making it to his next dialysis session.  Past medical records reviewed in Epic; patient was last admitted late last month for acute pulmonary edema and hypoxia.  On exam, the patient is hypertensive, but the other vital signs are normal.  He is maintaining an O2 sat in the high 90s on 2 L by nasal cannula.  He has decreased breath sounds bilaterally but no significant rales.  Differential includes fluid overload related to his kidney disease, acute on chronic CHF, less likely ACS or other acute cardiac cause.  Plan for chest x-ray, labs, and reassess.  ----------------------------------------- 4:43 PM on 03/13/2017 -----------------------------------------  On repeat exam, patient continues to report feeling significantly short of breath although his vital signs remained stable.  His O2 sat remains in the high 90s on 2 L nasal cannula.  His workup does not show significant signs of fluid overload or acute pulmonary edema.  I discussed the case with Dr. Candiss Norse from nephrology who agrees that based on the workup, there is no indication for emergent dialysis.  The past medical records somewhat unclear as to whether the patient has COPD; patient states that he is on multiple inhalers but he denies ever being formally diagnosed with it, however it is documented in the past records.  Given this I will give bronchodilators and steroid and reassess.  It is possible the patient's increased shortness of breath is from COPD.  If patient has no improvement, will plan for observation admission.    ----------------------------------------- 5:11 PM on 03/13/2017 -----------------------------------------  Dr. Candiss Norse has  evaluated the patient in person, and now does recommend doing a dialysis treatment.  Patient will be taken for dialysis, and then return to  the emergency department; if stable he will likely go home after this.  ----------------------------------------- 10:24 PM on 03/13/2017 -----------------------------------------  Patient developed tachycardia to as high as 135 towards the end of his dialysis, and had significant chest pain.  A rapid response was called.  By the time that we arrived to the dialysis unit, the patient's symptoms had resolved.  He remains tachycardic in the 120s, but his chest pain is resolved.  Patient's EKG shows no new findings other than the tachycardia.  Given patient's comorbidities, with the chest pain, and tachycardia significant enough to precipitate a rapid response, at this time I feel patient is most appropriate to be admitted for observation overnight and for ACS rule out.  Patient agrees with this plan. ____________________________________________   FINAL CLINICAL IMPRESSION(S) / ED DIAGNOSES  Final diagnoses:  End stage renal disease ([place])  Chest pain, unspecified type  Respiratory distress      NEW MEDICATIONS STARTED DURING THIS VISIT:  New Prescriptions   No medications on file     Note:  This document was prepared using Dragon voice recognition software and may include unintentional dictation errors.     Arta Silence, MD 03/13/17 2226

## 2017-03-13 NOTE — ED Triage Notes (Signed)
Pt in via [facility] from home, with [facility].  Pt is a MWF HD patient, states he had full treatment Friday, but feels like he is overloaded.  Pt is having hacking cough.  Pt breathing labored and dyspneic at rest.

## 2017-03-13 NOTE — Progress Notes (Signed)
HD tx end. Pt taken off tx 7 minutes early r/t chest pain. Heart rate increased from low 80's into the 120s.. Nitrostat was given at 2125 and again at 2130. MD notified and rapid response was called. Pt was stable throughout the tx , as well as while experiencing chest pain and legs cramps. 3425 mls were removed during tx. SOB improved.

## 2017-03-13 NOTE — ED Notes (Signed)
Pt is currently in dialysis. Report from [facility], pt to return to room after dialysis complete.

## 2017-03-13 NOTE — ED Notes (Signed)
Pt being transported to HD at this time.  Consent signed.

## 2017-03-13 NOTE — Significant Event (Signed)
Rapid Response Event Note  Overview: Time Called: 2133 Arrival Time: 2137 Event Type: Cardiac  Initial Focused Assessment: Pt. Active ED pt., 2 ED RN's present in room: April and Cyra when Rapid Response Team arrived.  Melissa, Dialysis RN also present. Pt. C/o chest pain during diaylsis.  Interventions: Melissa, dialysis RN had notified Dr. Candiss Norse of change and Dr. Cherylann Banas came bedside with ED charge RN to assist transfer back to ED. Pt. Had been given 2 nitro doses prior to the RRT's arrival, pt. Was alert and VSS.  Plan of Care (if not transferred): Rapid Response cancelled as pt. Was still an active pt. Of the ED.  Event Summary: Name of Physician Notified: Dr. Cherylann Banas at 2133(notified by ED RN, April prior to my arrival)  Name of Consulting Physician Notified: Dr. Candiss Norse at 2130(notified by diaylsis RN, Lenna Sciara prior to my arrival)  Outcome: Other (Comment)(pt. active ED pt., transferred from Dialysis to ED)  Event End Time: 2141  Domingo Pulse [place]

## 2017-03-14 ENCOUNTER — Other Ambulatory Visit: Payer: Self-pay

## 2017-03-14 ENCOUNTER — Other Ambulatory Visit (INDEPENDENT_AMBULATORY_CARE_PROVIDER_SITE_OTHER): Payer: Self-pay | Admitting: Vascular Surgery

## 2017-03-14 DIAGNOSIS — I248 Other forms of acute ischemic heart disease: Secondary | ICD-10-CM | POA: Diagnosis not present

## 2017-03-14 DIAGNOSIS — I132 Hypertensive heart and chronic kidney disease with heart failure and with stage 5 chronic kidney disease, or end stage renal disease: Secondary | ICD-10-CM | POA: Diagnosis not present

## 2017-03-14 DIAGNOSIS — R748 Abnormal levels of other serum enzymes: Secondary | ICD-10-CM

## 2017-03-14 DIAGNOSIS — N186 End stage renal disease: Secondary | ICD-10-CM | POA: Diagnosis not present

## 2017-03-14 DIAGNOSIS — I493 Ventricular premature depolarization: Secondary | ICD-10-CM

## 2017-03-14 DIAGNOSIS — J9621 Acute and chronic respiratory failure with hypoxia: Secondary | ICD-10-CM | POA: Diagnosis not present

## 2017-03-14 LAB — BASIC METABOLIC PANEL
ANION GAP: 15 (ref 5–15)
BUN: 45 mg/dL — AB (ref 6–20)
CO2: 24 mmol/L (ref 22–32)
Calcium: 8.7 mg/dL — ABNORMAL LOW (ref 8.9–10.3)
Chloride: 98 mmol/L — ABNORMAL LOW (ref 101–111)
Creatinine, Ser: 5.22 mg/dL — ABNORMAL HIGH (ref 0.61–1.24)
GFR calc Af Amer: 12 mL/min — ABNORMAL LOW (ref >60)
GFR, EST NON AFRICAN AMERICAN: 11 mL/min — AB (ref >60)
GLUCOSE: 302 mg/dL — AB (ref 65–99)
POTASSIUM: 4.6 mmol/L (ref 3.5–5.1)
Sodium: 137 mmol/L (ref 135–145)

## 2017-03-14 LAB — MAGNESIUM: Magnesium: 1.5 mg/dL — ABNORMAL LOW (ref 1.7–2.4)

## 2017-03-14 LAB — MRSA PCR SCREENING: MRSA BY PCR: NEGATIVE

## 2017-03-14 LAB — CBC
HEMATOCRIT: 34.5 % — AB (ref 40.0–52.0)
HEMOGLOBIN: 11.3 g/dL — AB (ref 13.0–18.0)
MCH: 30.6 pg (ref 26.0–34.0)
MCHC: 32.7 g/dL (ref 32.0–36.0)
MCV: 93.3 fL (ref 80.0–100.0)
Platelets: 135 10*3/uL — ABNORMAL LOW (ref 150–440)
RBC: 3.7 MIL/uL — ABNORMAL LOW (ref 4.40–5.90)
RDW: 17.9 % — AB (ref 11.5–14.5)
WBC: 5.9 10*3/uL (ref 3.8–10.6)

## 2017-03-14 LAB — GLUCOSE, CAPILLARY
GLUCOSE-CAPILLARY: 134 mg/dL — AB (ref 65–99)
GLUCOSE-CAPILLARY: 264 mg/dL — AB (ref 65–99)
Glucose-Capillary: 208 mg/dL — ABNORMAL HIGH (ref 65–99)

## 2017-03-14 LAB — TROPONIN I
TROPONIN I: 0.28 ng/mL — AB (ref <0.03)
Troponin I: 1.05 ng/mL (ref <0.03)
Troponin I: 1.39 ng/mL (ref <0.03)
Troponin I: 1.45 ng/mL (ref <0.03)

## 2017-03-14 LAB — PHOSPHORUS: Phosphorus: 9.7 mg/dL — ABNORMAL HIGH (ref 2.5–4.6)

## 2017-03-14 MED ORDER — SACUBITRIL-VALSARTAN 24-26 MG PO TABS: 1.0000 | ORAL_TABLET | Freq: Two times a day (BID) | ORAL | Status: DC

## 2017-03-14 MED ORDER — ORAL CARE MOUTH RINSE
15.0000 mL | Freq: Two times a day (BID) | OROMUCOSAL | Status: DC
  Administered 2017-03-14: 15 mL via OROMUCOSAL

## 2017-03-14 MED ORDER — FUROSEMIDE 40 MG PO TABS
40.0000 mg | ORAL_TABLET | Freq: Two times a day (BID) | ORAL | Status: DC
  Administered 2017-03-14: 40 mg via ORAL
  Filled 2017-03-14: qty 1

## 2017-03-14 MED ORDER — RENA-VITE PO TABS
1.0000 | ORAL_TABLET | Freq: Every day | ORAL | Status: DC
  Administered 2017-03-14: 1 via ORAL
  Filled 2017-03-14: qty 1

## 2017-03-14 MED ORDER — AMIODARONE HCL 200 MG PO TABS: ORAL_TABLET | ORAL | 1 refills | Status: DC

## 2017-03-14 MED ORDER — HYDROCODONE-ACETAMINOPHEN 5-325 MG PO TABS: 1.0000 | ORAL_TABLET | ORAL | Status: DC | PRN

## 2017-03-14 MED ORDER — ACETAMINOPHEN 325 MG PO TABS: 650.0000 mg | ORAL_TABLET | Freq: Four times a day (QID) | ORAL | Status: DC | PRN

## 2017-03-14 MED ORDER — HYDRALAZINE HCL 50 MG PO TABS: 100.0000 mg | ORAL_TABLET | Freq: Three times a day (TID) | ORAL | Status: DC

## 2017-03-14 MED ORDER — ONDANSETRON HCL 4 MG/2ML IJ SOLN: 4.0000 mg | Freq: Four times a day (QID) | INTRAMUSCULAR | Status: DC | PRN

## 2017-03-14 MED ORDER — ALBUTEROL SULFATE (2.5 MG/3ML) 0.083% IN NEBU: 2.5000 mg | INHALATION_SOLUTION | RESPIRATORY_TRACT | Status: DC | PRN

## 2017-03-14 MED ORDER — NITROGLYCERIN 0.4 MG SL SUBL: 0.4000 mg | SUBLINGUAL_TABLET | SUBLINGUAL | Status: DC | PRN

## 2017-03-14 MED ORDER — HEPARIN SODIUM (PORCINE) 5000 UNIT/ML IJ SOLN: 5000.0000 [IU] | Freq: Three times a day (TID) | INTRAMUSCULAR | Status: DC

## 2017-03-14 MED ORDER — CEFAZOLIN SODIUM-DEXTROSE 1-4 GM/50ML-% IV SOLN: 1.0000 g | Freq: Once | INTRAVENOUS | Status: DC

## 2017-03-14 MED ORDER — ISOSORBIDE MONONITRATE ER 30 MG PO TB24: 30.0000 mg | ORAL_TABLET | Freq: Every day | ORAL | Status: DC

## 2017-03-14 MED ORDER — INSULIN ASPART 100 UNIT/ML ~~LOC~~ SOLN
0.0000 [IU] | Freq: Three times a day (TID) | SUBCUTANEOUS | Status: DC
  Administered 2017-03-14: 3 [IU] via SUBCUTANEOUS
  Administered 2017-03-14: 1 [IU] via SUBCUTANEOUS
  Filled 2017-03-14: qty 1

## 2017-03-14 MED ORDER — ATORVASTATIN CALCIUM 20 MG PO TABS
40.0000 mg | ORAL_TABLET | Freq: Every day | ORAL | Status: DC
  Administered 2017-03-14: 40 mg via ORAL
  Filled 2017-03-14: qty 2

## 2017-03-14 MED ORDER — CALCIUM ACETATE (PHOS BINDER) 667 MG PO CAPS
2001.0000 mg | ORAL_CAPSULE | Freq: Three times a day (TID) | ORAL | Status: DC
  Administered 2017-03-14 (×3): 2001 mg via ORAL
  Filled 2017-03-14 (×2): qty 3

## 2017-03-14 MED ORDER — ONDANSETRON HCL 4 MG PO TABS: 4.0000 mg | ORAL_TABLET | Freq: Four times a day (QID) | ORAL | Status: DC | PRN

## 2017-03-14 MED ORDER — ASPIRIN 81 MG PO CHEW: 81.0000 mg | CHEWABLE_TABLET | Freq: Every day | ORAL | Status: DC

## 2017-03-14 MED ORDER — CARVEDILOL 6.25 MG PO TABS
6.2500 mg | ORAL_TABLET | Freq: Two times a day (BID) | ORAL | Status: DC
  Administered 2017-03-14: 6.25 mg via ORAL
  Filled 2017-03-14: qty 1

## 2017-03-14 MED ORDER — IPRATROPIUM BROMIDE 0.02 % IN SOLN: 0.5000 mg | RESPIRATORY_TRACT | Status: DC | PRN

## 2017-03-14 MED ORDER — BISACODYL 5 MG PO TBEC: 5.0000 mg | DELAYED_RELEASE_TABLET | Freq: Every day | ORAL | Status: DC | PRN

## 2017-03-14 MED ORDER — LIDOCAINE-PRILOCAINE 2.5-2.5 % EX CREA
1.0000 "application " | TOPICAL_CREAM | CUTANEOUS | Status: DC | PRN
  Filled 2017-03-14: qty 5

## 2017-03-14 MED ORDER — ACETAMINOPHEN 650 MG RE SUPP: 650.0000 mg | Freq: Four times a day (QID) | RECTAL | Status: DC | PRN

## 2017-03-14 MED ORDER — IPRATROPIUM BROMIDE HFA 17 MCG/ACT IN AERS: 2.0000 | INHALATION_SPRAY | RESPIRATORY_TRACT | Status: DC | PRN

## 2017-03-14 MED ORDER — DOCUSATE SODIUM 100 MG PO CAPS: 100.0000 mg | ORAL_CAPSULE | Freq: Two times a day (BID) | ORAL | Status: DC

## 2017-03-14 MED ORDER — AMIODARONE HCL 200 MG PO TABS
400.0000 mg | ORAL_TABLET | Freq: Two times a day (BID) | ORAL | Status: DC
  Filled 2017-03-14: qty 2

## 2017-03-14 MED ORDER — INSULIN ASPART 100 UNIT/ML ~~LOC~~ SOLN: 0.0000 [IU] | Freq: Every day | SUBCUTANEOUS | Status: DC

## 2017-03-14 MED ORDER — CINACALCET HCL 30 MG PO TABS
90.0000 mg | ORAL_TABLET | Freq: Every day | ORAL | Status: DC
  Filled 2017-03-14: qty 3

## 2017-03-14 NOTE — Progress Notes (Signed)
Pre HD assessment  

## 2017-03-14 NOTE — Consult Note (Signed)
Cardiology Consultation:   Patient ID: [name]; [id number]; 1956-02-08   Admit date: 03/13/2017 Date of Consult: 03/14/2017  Primary Care Provider: [facility] [place] Primary Cardiologist: End   Patient Profile:   [name] is a 62 y.o. male with a hx of mixed NICM/ICM by LHC in 06/2016 as detailed below, chronic combined CHF, frequent PVCs with ventricular bigeminy, ESRD on HD (MWF), anemia of chronic disease, pulmonary HTN, hypertensive heart disease and PVD who is being seen today for the evaluation of elevated troponin at the request of Dr. Posey Pronto.  History of Present Illness:   [name] has previously refused LHC for further evaluation of his CHF. Prior nuclear stress test in 09/2013 low risk, EF 48%. TTE 10/2013 EF 50%, DD, TTE 10/2014 EF 30-35% with diffuse HK, GR1DD, mild AI, mild to moderate MR, moderate to severe pulmonary hypertension, TTE 04/2016 EF 20-25%, diffuse HK, GR3DD, moderate AI, RV moderately dilated, moderate TR. TTE 06/2016 with EF 30-35%, GR2DD, mild to moderate MR, mild AI, dilated RV, severe pulmonary HTN. LHC by [facility] 06/2016 showed subtotal occlusion of proximal RCA with left-to-right collaterals, diffuse disease of the diagonal branches, LAD and LCx with mild to moderate diffuse disease 30-40%, and elevated LVEDP. His volume is managed by HD. He continues to make a small amount of urine and is on torsemide at home. Recently admitted to [facility] in July x 2 for volume overload. He underwent HD with improvement in symptoms. He has been managed by the [facility] and has been transitioned from losartan to [facility] by them (HD dependent). Admitted 12/25 with pulmonary edema. Improved with HD. He was noted to have frequent PVCs on telemetry in a pattern of ventricular bigeminy. His Coreg was resumed (hlef by IM) and titrated. Magnesium was noted to be low at 1.6. Potassium at goal. TSH normal. He wore a 24-hour 03/01/17 that showed NSR with  frequent polymorphic PVCs with a total of 38,000 beats in 24 hours (34% burden). Average heart rate of 74 bpm. Follow up with EP was advised.   Patient developed worsening SOB 2 days prior with associated chest tightness. Weight has been "up and down." He cannot provide specifics. Per the patient, nephrology was planning on having him undergo HD an extra day this past week but this did not occur for unclear reasons. He has not missed any HD sessions. Because of his worsening SOB and chest tightness he presented to [facility] on 1/20. CXR was not acute. BNP 4,000, troponin 0.19 x 2 -->0.28-->1.05. He underwent emergent HD with ~ 3 L of fluid removal and resolution of his symptoms. Cardiology asked to evaluate his elevated troponin. Currently reports he feels "great, I can run a marathon."   Past Medical History:  Diagnosis Date  . Arthritis   . Chest pain    a. 09/2013 Myoview [facility]): basal inf defect more pronounced @ rest, likely artifact, EF 48%, low risk study.  . Chronic combined systolic and diastolic CHF (congestive heart failure) ([place])    a. 10/2013 Echo [facility]): EF 50%, diast dysfxn, triv AI/TR, mild MR;  b. 10/2014 Echo: EF 30-35%, diff HK, gr1 DD, mild AI, mild to mod MR/TR, mod dil LA, nl RV, mod to sev PAH;  c. 04/2016 Echo: EF 20-25%, sev dil LV, diff HK, gr3 DD, mod AI, MV syst bowing w/ prolapse, mod dil RV, mod TR.  . Diabetes ([place])   . Dialysis patient ([place])    Mon, Wed, Fri  .  Dyspnea   . ESRD (end stage renal disease) ([place])    a. MWF dialysis @ [facility].  . Hepatitis    Patient is unsure type of Hepatitis he has  . Hypertension   . Hypertensive heart disease with CHF (congestive heart failure) ([place])   . Peripheral vascular disease ([place])   . Pulmonary hypertension ([place]) 04/2016  . Renal insufficiency     Past Surgical History:  Procedure Laterality Date  . A/V FISTULAGRAM Left 04/06/2016   Procedure: A/V Fistulagram;  Surgeon: Katha Cabal, MD;  Location: [place]  CV LAB;  Service: Cardiovascular;  Laterality: Left;  . A/V FISTULAGRAM Left 01/19/2017   Procedure: A/V FISTULAGRAM;  Surgeon: Katha Cabal, MD;  Location: [place] CV LAB;  Service: Cardiovascular;  Laterality: Left;  . A/V SHUNT INTERVENTION N/A 04/06/2016   Procedure: A/V Shunt Intervention;  Surgeon: Katha Cabal, MD;  Location: [place] CV LAB;  Service: Cardiovascular;  Laterality: N/A;  . A/V SHUNT INTERVENTION N/A 06/29/2016   Procedure: A/V Shunt Intervention;  Surgeon: Katha Cabal, MD;  Location: [place] CV LAB;  Service: Cardiovascular;  Laterality: N/A;  . AV FISTULA PLACEMENT Left 2014  . DIALYSIS/PERMA CATHETER INSERTION Right    Dr. Delana Meyer  . PERIPHERAL VASCULAR CATHETERIZATION N/A 10/03/2014   Procedure: A/V Shuntogram/Fistulagram;  Surgeon: Algernon Huxley, MD;  Location: [place] CV LAB;  Service: Cardiovascular;  Laterality: N/A;  . PERIPHERAL VASCULAR CATHETERIZATION Left 10/03/2014   Procedure: A/V Shunt Intervention;  Surgeon: Algernon Huxley, MD;  Location: [place] CV LAB;  Service: Cardiovascular;  Laterality: Left;  . PERIPHERAL VASCULAR CATHETERIZATION Left 05/01/2015   Procedure: A/V Shuntogram/Fistulagram;  Surgeon: Algernon Huxley, MD;  Location: [place] CV LAB;  Service: Cardiovascular;  Laterality: Left;  . PERIPHERAL VASCULAR CATHETERIZATION N/A 05/01/2015   Procedure: A/V Shunt Intervention;  Surgeon: Algernon Huxley, MD;  Location: [place] CV LAB;  Service: Cardiovascular;  Laterality: N/A;  . UPPER EXTREMITY ANGIOGRAPHY Bilateral 06/29/2016   Procedure: Upper Extremity Angiography;  Surgeon: Katha Cabal, MD;  Location: [place] CV LAB;  Service: Cardiovascular;  Laterality: Bilateral;  . UPPER EXTREMITY INTERVENTION  06/29/2016   Procedure: Upper Extremity Intervention;  Surgeon: Katha Cabal, MD;  Location: [place] CV LAB;  Service: Cardiovascular;;     Home Meds: Prior to Admission medications     Medication Sig Start Date End Date Taking? Authorizing Provider  acetaminophen (TYLENOL) 325 MG tablet Take 2 tablets (650 mg total) by mouth every 6 (six) hours as needed for mild pain (or Fever >/= 101). Patient taking differently: Take 500 mg by mouth every 6 (six) hours as needed for mild pain (or Fever >/= 101).  09/21/16  Yes Gouru, Aruna, MD  albuterol (PROVENTIL HFA;VENTOLIN HFA) 108 (90 Base) MCG/ACT inhaler Inhale into the lungs every 4 (four) hours as needed for wheezing or shortness of breath.   Yes [provider]  aspirin 81 MG chewable tablet Chew 81 mg by mouth. 07/21/16  Yes [provider]  atorvastatin (LIPITOR) 40 MG tablet Take 1 tablet (40 mg total) by mouth daily. 02/19/17  Yes Demetrios Loll, MD  calcium acetate (PHOSLO) 667 MG capsule Take 1,334 mg by mouth 3 (three) times daily with meals. Take 2001 mgs with meals 3 times daily and 1334 with snacks 12/11/15  Yes [provider]  carvedilol (COREG) 6.25 MG tablet Take 1 tablet (6.25 mg total) by mouth 2 (two) times daily with  a meal. 09/21/16  Yes Gouru, Aruna, MD  cinacalcet (SENSIPAR) 90 MG tablet Take 90 mg by mouth daily.   Yes [provider]  furosemide (LASIX) 40 MG tablet Take 1 tablet (40 mg total) by mouth 2 (two) times daily. 02/18/17  Yes Demetrios Loll, MD  hydrALAZINE (APRESOLINE) 50 MG tablet Take 100 mg by mouth 3 (three) times daily.  07/01/16  Yes [provider]  ipratropium (ATROVENT HFA) 17 MCG/ACT inhaler Inhale 2 puffs into the lungs every 4 (four) hours as needed for wheezing.   Yes [provider]  isosorbide mononitrate (IMDUR) 30 MG 24 hr tablet Take 1 tablet (30 mg total) by mouth daily. Patient taking differently: Take 30 mg by mouth 2 (two) times daily.  08/24/16 02/17/18 Yes Dustin Flock, MD  lidocaine-prilocaine (EMLA) cream Apply 1 application topically as needed (dialysis access).   Yes [provider]  multivitamin (RENA-VIT) TABS tablet  Take 1 tablet by mouth daily.   Yes [provider]  nitroGLYCERIN (NITROSTAT) 0.4 MG SL tablet Place 0.4 mg under the tongue every 5 (five) minutes as needed for chest pain.   Yes [provider]  sacubitril-valsartan (ENTRESTO) 24-26 MG Take 1 tablet by mouth 2 (two) times daily. 10/05/16  Yes Alisa Graff, FNP    Inpatient Medications: Scheduled Meds: . aspirin  81 mg Oral Daily  . atorvastatin  40 mg Oral Daily  . calcium acetate  2,001 mg Oral TID WC  . carvedilol  6.25 mg Oral BID WC  . cinacalcet  90 mg Oral Daily  . docusate sodium  100 mg Oral BID  . furosemide  40 mg Oral BID  . heparin  5,000 Units Subcutaneous Q8H  . hydrALAZINE  100 mg Oral TID  . insulin aspart  0-5 Units Subcutaneous QHS  . insulin aspart  0-9 Units Subcutaneous TID WC  . isosorbide mononitrate  30 mg Oral Daily  . mouth rinse  15 mL Mouth Rinse BID  . multivitamin  1 tablet Oral Daily  . sacubitril-valsartan  1 tablet Oral BID   Continuous Infusions:  PRN Meds: acetaminophen **OR** acetaminophen, albuterol, bisacodyl, ipratropium, lidocaine-prilocaine, nitroGLYCERIN, ondansetron **OR** ondansetron (ZOFRAN) IV  Allergies:   Allergies  Allergen Reactions  . Sulfa Antibiotics Rash    Social History:   Social History   Socioeconomic History  . Marital status: Widowed    Spouse name: Not on file  . Number of children: Not on file  . Years of education: Not on file  . Highest education level: Not on file  Social Needs  . Financial resource strain: Not on file  . Food insecurity - worry: Not on file  . Food insecurity - inability: Not on file  . Transportation needs - medical: Not on file  . Transportation needs - non-medical: Not on file  Occupational History  . Not on file  Tobacco Use  . Smoking status: Former Smoker    Packs/day: 0.25    Years: 40.00    Pack years: 10.00    Types: Cigarettes    Last attempt to quit: 10/12/2016    Years since quitting: 0.4  .  Smokeless tobacco: Never Used  Substance and Sexual Activity  . Alcohol use: No    Alcohol/week: 0.0 oz  . Drug use: Yes    Frequency: 1.0 times per week    Types: Cocaine    Comment: 07/30/16 pt states last use 3-4 weeks ago  . Sexual activity: Not Currently  Other Topics Concern  . Not on file  Social History Narrative   Lives at home in [place] by himself.   Independent on ambulation but does not routinely exercise.     Family History:   Family History  Problem Relation Age of Onset  . Hypertension Son   . Diabetes Son   . Hypertension Mother   . Diabetes Mother   . Diabetes Sister     ROS:  Review of Systems  Constitutional: Positive for malaise/fatigue. Negative for chills, diaphoresis, fever and weight loss.  HENT: Negative for congestion.   Eyes: Negative for discharge and redness.  Respiratory: Positive for cough, shortness of breath and wheezing. Negative for hemoptysis and sputum production.   Cardiovascular: Positive for chest pain. Negative for palpitations, orthopnea, claudication, leg swelling and PND.  Gastrointestinal: Negative for abdominal pain, blood in stool, heartburn, melena, nausea and vomiting.  Genitourinary: Negative for hematuria.  Musculoskeletal: Negative for falls and myalgias.  Skin: Negative for rash.  Neurological: Positive for weakness. Negative for dizziness, tingling, tremors, sensory change, speech change, focal weakness and loss of consciousness.  Endo/Heme/Allergies: Does not bruise/bleed easily.  Psychiatric/Behavioral: Negative for substance abuse. The patient is not nervous/anxious.   All other systems reviewed and are negative.     Physical Exam/Data:   Vitals:   03/13/17 2315 03/13/17 2349 03/14/17 0509 03/14/17 0745  BP:  (!) 148/94 (!) 147/91 (!) 131/59  Pulse: (!) 41 (!) 42 (!) 55 (!) 39  Resp: 20 17 18 18   Temp:  97.9 F (36.6 C) 98 F (36.7 C) 98.2 F (36.8 C)  TempSrc:  Oral    SpO2: 99% 100% 98% 100%  Weight:   156 lb 11.2 oz (71.1 kg) 157 lb 8 oz (71.4 kg)   Height:   5\' 8"  (1.727 m)     Intake/Output Summary (Last 24 hours) at 03/14/2017 0926 Last data filed at 03/13/2017 2140 Gross per 24 hour  Intake -  Output 3609 ml  Net -3609 ml   Filed Weights   03/13/17 1515 03/13/17 2349 03/14/17 0509  Weight: 168 lb (76.2 kg) 156 lb 11.2 oz (71.1 kg) 157 lb 8 oz (71.4 kg)   Body mass index is 23.95 kg/m.   Physical Exam: General: Well developed, well nourished, in no acute distress. Head: Normocephalic, atraumatic, sclera non-icteric, no xanthomas, nares without discharge. Neck: Negative for carotid bruits. JVD mildly elevated. Lungs: Clear bilaterally to auscultation without wheezes, rales, or rhonchi. Breathing is unlabored. Heart: Irregular with S1 S2. III/VI systolic murmur LUSB, no rubs, or gallops appreciated. Abdomen: Soft, non-tender, non-distended with normoactive bowel sounds. No hepatomegaly. No rebound/guarding. No obvious abdominal masses. Msk:  Strength and tone appear normal for age. Extremities: No clubbing or cyanosis. No edema. Distal pedal pulses are 2+ and equal bilaterally. Neuro: Alert and oriented X 3. No facial asymmetry. No focal deficit. Moves all extremities spontaneously. Psych:  Responds to questions appropriately with a normal affect.   EKG:  The EKG was personally reviewed and demonstrates: sinus tachycardia, 119 bpm, frequent PVCs in a pattern ventricular bigeminy, LVH, nonspecific inferior st/t changes  Telemetry:  Telemetry was personally reviewed and demonstrates: sinus bradycardia, frequent PVCs in a pattern of ventricular bigeminy  Weights: Filed Weights   03/13/17 1515 03/13/17 2349 03/14/17 0509  Weight: 168 lb (76.2 kg) 156 lb 11.2 oz (71.1 kg) 157 lb 8 oz (71.4 kg)    Relevant CV Studies: 24-hour Holter 02/2017: Study Highlights   Normal sinus rhythm. Very frequent  polymorphic PVCs (mainly to different morphology) with a total of 38,000 beats in  24 hours (34% burden) Average heart rate was 74 bpm.   Cardiac cath 06/2016: FINDINGS: Native Vessel Coronary Angiography: - Dominance: Right  - Left Main:Very large  LAD: - Ostial LAD: Large caliber - Proximal LAD: Large caliber with irregularity up to 10% - Mid LAD: Moderate to large caliber with 30-40% stenosis - Distal LAD: Small to moderate caliber with diffuse irregularity up to  40-50% - Diag 1: Small caliber vessel with ostial 90% stenosis - Diag 2: Small to moderate caliber branching vessel with irregularity up  to 40% - Diag 3: Small caliber moderate distribution with diffuse irregularity  proximally up to 60-70% - Diag 4: Small to moderate caliber moderate distribution vessel with  irregularity up to 30% - Diag 5: Small caliber vessel  LCX: - Ostial LCx: Moderate to large caliber - Proximal LCx: Moderate to large caliber with irregularity up to 10% - Mid LCx: Moderate caliber with irregularity up to 30% - Distal LCx: Small to moderate caliber with irregularity to 20% - OM1: Moderate caliber moderate distribution with irregularity up to 30% - OM2: Very small caliber - OM3: Moderate caliber moderate distribution vessel with irregularity up  to 40%  RCA: - Ostial RCA: Moderate caliber - Proximal RCA: Functionally subtotaled with bridging collaterals poorly  opacifying the mid vessel - Mid RCA: Small to moderate caliber primarily being filled retrograde  from collaterals from the left system - Distal RCA: Moderate caliber poorly visualized filling primarily via  collaterals from left system - Right PDA: Poorly visualized fills via collaterals - RCA continuation: Poorly visualized fills via collaterals - PL branch 1: Poorly visualized fills by collaterals  Left Ventriculogram: - Not performed  Hemodynamics: - Aortic pressure: 111/70  - Mean: 85 - LVEDP (pre-A wave): 15 - LVEDP (post-A wave): 25-30   CONCLUSIONS: - Subtotaled proximal RCA with left to  right collaterals - Diagonal branches with diffuse disease - Left anterior descending and circumflex systems with mild to moderate  diffuse disease up to 30-40% - Elevated left ventricular end-diastolic pressure of 30 post A wave   INDICATION:62 year old and on dialysis with new drop in LV systolic  function for further evaluation  PLAN: Optimize medical therapy   Echo 06/2016: Ultrasound enhancing agent utilized to improve endocardial border  definition  Dilated left ventricle - mild  Left ventricular hypertrophy - mild  Severely decreased left ventricular systolic function, ejection fraction  30 to 31%  Diastolic dysfunction - grade II (elevated filling pressures)  Degenerative mitral valve disease  Mitral annular calcification  Mitral regurgitation - mild to moderate  Dilated left atrium - moderate  Aortic sclerosis  Aortic regurgitation - mild  Dilated right ventricle  Mildly decreased right ventricular systolic function  Tricuspid regurgitation - mild  Elevated pulmonary artery systolic pressure - severe  Elevated right atrial pressure    Laboratory Data:  Chemistry Recent Labs  Lab 03/13/17 1514 03/14/17 0106  NA 139 137  K 5.0 4.6  CL 100* 98*  CO2 21* 24  GLUCOSE 133* 302*  BUN 80* 45*  CREATININE 7.74* 5.22*  CALCIUM 9.0 8.7*  GFRNONAA 7* 11*  GFRAA 8* 12*  ANIONGAP 18* 15    Recent Labs  Lab 03/13/17 1514  PROT 8.5*  ALBUMIN 3.9  AST 32  ALT 34  ALKPHOS 98  BILITOT 0.9   Hematology Recent Labs  Lab 03/13/17 1514 03/14/17 0106  WBC 6.4 5.9  RBC  3.97* 3.70*  HGB 12.0* 11.3*  HCT 37.6* 34.5*  MCV 94.6 93.3  MCH 30.2 30.6  MCHC 32.0 32.7  RDW 18.1* 17.9*  PLT 132* 135*   Cardiac Enzymes Recent Labs  Lab 03/13/17 1514 03/14/17 0106 03/14/17 0742  TROPONINI 0.19* 0.28* 1.05*   No results for input(s): TROPIPOC in the last 168 hours.  BNP Recent Labs  Lab 03/13/17 1514  BNP 4,082.0*    DDimer No results  for input(s): DDIMER in the last 168 hours.  Radiology/Studies:  Dg Chest 2 View  Result Date: 03/13/2017 IMPRESSION: No active cardiopulmonary disease. Electronically Signed   By: Dorise Bullion III M.D   On: 03/13/2017 15:50    Assessment and Plan:   1. Elevated troponin/CAD: -Mildly elevated with a current peak of 1.05, continue to cycle until peaks -Likely supply demand ischemia in the setting of volume overload, AECOPD, and ESRD -Hold heparin gtt unless there is dynamic elevation of troponin  -Recent LHC as above in 06/2016 -Consider outpatient ischemic evaluation  -ASA, Lipitor, Coreg -Imdur added  2. Frequent PVCs/ventricular bigeminy: -Possibly playing a role in his cardiomyopathy -Add amiodarone 400 mg bid x 1 week, followed by 200 mg bid x 1 week, then 200 mg daily thereafter  -Coreg -Check magnesium with recommendation to replete to goal > 2.0 -Potassium at goal -Recent TSH normal -Plan for recheck TTE with possible repeat 24 hour Holter in 1-2 months to evaluate for improvement in EF following amiodarone as above   3. Acute on chronic combined CHF: -Volume managed by HD -Consider PO Lasix on non-HD days -Coreg -Entresto per CHF Clinic  -Imdur/hydralazine  -CHF education -Daily weights  4. ESRD: -On HD MWF  5. Acute on chronic respiratory failure with hypoxia: -Likely multifactorial including AECOPD, acute on chronic CHF, and bigeminy -Wean oxygen to baseline as tolerated     For questions or updates, please contact [facility] Please consult [email or web address] for contact info under Cardiology/STEMI.   Signed, Christell Faith, PA-C [facility] Pager: [phone] 03/14/2017, 9:26 AM

## 2017-03-14 NOTE — Progress Notes (Signed)
HD tx start 

## 2017-03-14 NOTE — Progress Notes (Signed)
[place], [place] 03/14/17  Subjective:  The patient was admitted chest pain on Jan 20.  He currently goes to dialysis on Monday, Wednesday, Friday.  He underwent emergency hemodialysis.  He states that he is feeling better and that the chest pain and shortness of breath have resolved.  He denies edema.   Objective:  Vital signs in last 24 hours:  Temp:  [97.5 F (36.4 C)-98.5 F (36.9 C)] 98.2 F (36.8 C) (01/21 0745) Pulse Rate:  [39-128] 39 (01/21 0745) Resp:  [10-37] 18 (01/21 0745) BP: (131-173)/(59-112) 131/59 (01/21 0745) SpO2:  [94 %-100 %] 100 % (01/21 0745) Weight:  [71.1 kg (156 lb 11.2 oz)-76.2 kg (168 lb)] 71.4 kg (157 lb 8 oz) (01/21 0509)  Weight change:  Filed Weights   03/13/17 1515 03/13/17 2349 03/14/17 0509  Weight: 76.2 kg (168 lb) 71.1 kg (156 lb 11.2 oz) 71.4 kg (157 lb 8 oz)    Intake/Output:    Intake/Output Summary (Last 24 hours) at 03/14/2017 1120 Last data filed at 03/14/2017 1046 Gross per 24 hour  Intake 360 ml  Output 3609 ml  Net -3249 ml     Physical Exam: General:  Walking around the room. Appears comfortable.   HEENT  normocephalic, atraumatic.   Neck  Supple  Pulm/lungs  clear to auscultation. On room air    CVS/Heart  regular, no rub or gallop  Abdomen:   Soft, nontender  Extremities:  No edema   Neurologic:  Alert, oriented  Skin:  Dry skin  Access:  IJ CVC       Basic Metabolic Panel:  Recent Labs  Lab 03/13/17 1514 03/14/17 0106  NA 139 137  K 5.0 4.6  CL 100* 98*  CO2 21* 24  GLUCOSE 133* 302*  BUN 80* 45*  CREATININE 7.74* 5.22*  CALCIUM 9.0 8.7*     CBC: Recent Labs  Lab 03/13/17 1514 03/14/17 0106  WBC 6.4 5.9  HGB 12.0* 11.3*  HCT 37.6* 34.5*  MCV 94.6 93.3  PLT 132* 135*      Lab Results  Component Value Date   HEPBSAG Negative 02/15/2017   HEPBSAB Reactive 02/15/2017      Microbiology:  Recent Results (from the past 240 hour(s))  MRSA PCR Screening      Status: None   Collection Time: 03/14/17 12:37 AM  Result Value Ref Range Status   MRSA by PCR NEGATIVE NEGATIVE Final    Comment:        The GeneXpert MRSA Assay (FDA approved for NASAL specimens only), is one component of a comprehensive MRSA colonization surveillance program. It is not intended to diagnose MRSA infection nor to guide or monitor treatment for MRSA infections. Performed at [facility], [address] [place], [place] [address]     Coagulation Studies: No results for input(s): LABPROT, INR in the last 72 hours.  Urinalysis: No results for input(s): COLORURINE, LABSPEC, PHURINE, GLUCOSEU, HGBUR, BILIRUBINUR, KETONESUR, PROTEINUR, UROBILINOGEN, NITRITE, LEUKOCYTESUR in the last 72 hours.  Invalid input(s): APPERANCEUR    Imaging: Dg Chest 2 View  Result Date: 03/13/2017 CLINICAL DATA:  Cough.  Shortness of breath. EXAM: CHEST  2 VIEW COMPARISON:  January 27, 2017 FINDINGS: A double lumen right central line is identified. No pneumothorax. Stable cardiomegaly. The hila and mediastinum are unremarkable. No pulmonary nodules or masses. No focal infiltrates. No overt edema. IMPRESSION: No active cardiopulmonary disease. Electronically Signed   By: Dorise Bullion III M.D   On:  03/13/2017 15:50     Medications:       Assessment/ Plan:  62 y.o. male withend stage renal disease on hemodialysis, hypertension, hepatitis C, anemia, seizure disorder, COPD/tobacco abuse,history of substance abuse,secondary hyperparathyroidism, chronic systolic congestive heart failure with EF of 20-25%, moderate mitral regurgitation, moderate tricuspid regurgitation, severe pulmonary hypertension with four-chamber dilatation of the heart  [facility] MWF [address] [address] [facility] permcath  1.  Acute shortness of breath likely combination of COPD exacerbation and volume overload from pulmonary edema 2.  Hypertension 3.  End-stage renal disease 4.  Acute exacerbation of chronic  systolic congestive heart failure  Plan: Patient was dialyzed yesterday and experienced some cramping as well as PVCs.  His condition has improved post treatment.  We plan to get him back on his regular dialysis schedule today but with less aggressive treatment, UF target 1.5kg.  He will continue dialysis as an outpatient thereafter.      LOS: 1 Marieme Mcmackin 1/21/201911:20 [place], [place]

## 2017-03-14 NOTE — Plan of Care (Signed)
  Progressing Elimination: Will not experience complications related to bowel motility 03/14/2017 1127 - Progressing by Gildardo Pounds, RN Will not experience complications related to urinary retention 03/14/2017 1127 - Progressing by Gildardo Pounds, RN

## 2017-03-14 NOTE — Progress Notes (Signed)
Post HD assessment  

## 2017-03-14 NOTE — H&P (Signed)
[facility] NAME: [name]    MR#:  [id number]  DATE OF BIRTH:  08/23/1955  DATE OF ADMISSION:  03/13/2017  PRIMARY CARE PHYSICIAN: [facility] [place]   REQUESTING/REFERRING PHYSICIAN:   CHIEF COMPLAINT:   Chief Complaint  Patient presents with  . Shortness of Breath    HISTORY OF PRESENT ILLNESS: [name]  is a 62 y.o. male with a known history of end-stage renal disease and chronic combined systolic and diastolic CHF with an ejection fraction around 25% per most recent 2D echo done in 2018.  Patient also has history of COPD, diabetes, diet-controlled; hypertension, peripheral vascular disease and pulmonary hypertension. Patient presented to emergency room for acute onset of shortness of breath, wheezing and chest tightness, going on for the past 24 hours and progressively getting worse.  Patient states that his symptoms are worse with exertion and are similar to prior episodes of pulmonary edema.  He states his been compliant with his medications and dialysis sessions; last hemodialysis treatment was 2 days ago. In emergency room, his blood pressure was elevated and oxygen saturation was low at 90% on room air. The blood tests revealed slightly elevated troponin level is 0.19; BNP at 4000; chest x-ray revealed pulmonary vascular congestion. He was treated for COPD exacerbation. Per nephrology assessment, patient was deemed to require emergency hemodialysis.  Approximately 3 L of fluid were removed.  Patient's shortness of breath and chest pain resolved.  However, towards the end of dialysis session he was noted to have a short episode of tachycardia with heart rate in 130s. Patient is admitted for observation, to monitor EKG and troponin levels.  He is to continue with his regular hemodialysis schedule, Monday, Wednesday, Friday.  PAST MEDICAL HISTORY:   Past Medical History:  Diagnosis Date  .  Arthritis   . Chest pain    a. 09/2013 Myoview [facility]): basal inf defect more pronounced @ rest, likely artifact, EF 48%, low risk study.  . Chronic combined systolic and diastolic CHF (congestive heart failure) ([place])    a. 10/2013 Echo [facility]): EF 50%, diast dysfxn, triv AI/TR, mild MR;  b. 10/2014 Echo: EF 30-35%, diff HK, gr1 DD, mild AI, mild to mod MR/TR, mod dil LA, nl RV, mod to sev PAH;  c. 04/2016 Echo: EF 20-25%, sev dil LV, diff HK, gr3 DD, mod AI, MV syst bowing w/ prolapse, mod dil RV, mod TR.  . Diabetes ([place])   . Dialysis patient ([place])    Mon, Wed, Fri  . Dyspnea   . ESRD (end stage renal disease) ([place])    a. MWF dialysis @ [facility].  . Hepatitis    Patient is unsure type of Hepatitis he has  . Hypertension   . Hypertensive heart disease with CHF (congestive heart failure) ([place])   . Peripheral vascular disease ([place])   . Pulmonary hypertension ([place]) 04/2016  . Renal insufficiency     PAST SURGICAL HISTORY:  Past Surgical History:  Procedure Laterality Date  . A/V FISTULAGRAM Left 04/06/2016   Procedure: A/V Fistulagram;  Surgeon: Katha Cabal, MD;  Location: [place] [facility];  Service: Cardiovascular;  Laterality: Left;  . A/V FISTULAGRAM Left 01/19/2017   Procedure: A/V FISTULAGRAM;  Surgeon: Katha Cabal, MD;  Location: [place] [facility];  Service: Cardiovascular;  Laterality: Left;  . A/V SHUNT INTERVENTION N/A 04/06/2016   Procedure: A/V Shunt Intervention;  Surgeon: Katha Cabal, MD;  Location: [place] [facility];  Service: Cardiovascular;  Laterality: N/A;  . A/V SHUNT INTERVENTION N/A 06/29/2016   Procedure: A/V Shunt Intervention;  Surgeon: Katha Cabal, MD;  Location: [place] [facility];  Service: Cardiovascular;  Laterality: N/A;  . AV FISTULA PLACEMENT Left 2014  . DIALYSIS/PERMA CATHETER INSERTION Right    Dr. Delana Meyer  . PERIPHERAL VASCULAR CATHETERIZATION N/A 10/03/2014   Procedure: A/V Shuntogram/Fistulagram;  Surgeon: Algernon Huxley, MD;  Location: [place] [facility];  Service: Cardiovascular;  Laterality: N/A;  . PERIPHERAL VASCULAR CATHETERIZATION Left 10/03/2014   Procedure: A/V Shunt Intervention;  Surgeon: Algernon Huxley, MD;  Location: [place] [facility];  Service: Cardiovascular;  Laterality: Left;  . PERIPHERAL VASCULAR CATHETERIZATION Left 05/01/2015   Procedure: A/V Shuntogram/Fistulagram;  Surgeon: Algernon Huxley, MD;  Location: [place] [facility];  Service: Cardiovascular;  Laterality: Left;  . PERIPHERAL VASCULAR CATHETERIZATION N/A 05/01/2015   Procedure: A/V Shunt Intervention;  Surgeon: Algernon Huxley, MD;  Location: [place] [facility];  Service: Cardiovascular;  Laterality: N/A;  . UPPER EXTREMITY ANGIOGRAPHY Bilateral 06/29/2016   Procedure: Upper Extremity Angiography;  Surgeon: Katha Cabal, MD;  Location: [place] [facility];  Service: Cardiovascular;  Laterality: Bilateral;  . UPPER EXTREMITY INTERVENTION  06/29/2016   Procedure: Upper Extremity Intervention;  Surgeon: Katha Cabal, MD;  Location: [place] [facility];  Service: Cardiovascular;;    SOCIAL HISTORY:  Social History   Tobacco Use  . Smoking status: Former Smoker    Packs/day: 0.25    Years: 40.00    Pack years: 10.00    Types: Cigarettes    Last attempt to quit: 10/12/2016    Years since quitting: 0.4  . Smokeless tobacco: Never Used  Substance Use Topics  . Alcohol use: No    Alcohol/week: 0.0 oz    FAMILY HISTORY:  Family History  Problem Relation Age of Onset  . Hypertension Son   . Diabetes Son   . Hypertension Mother   . Diabetes Mother   . Diabetes Sister     DRUG ALLERGIES:  Allergies  Allergen Reactions  . Sulfa Antibiotics Rash    REVIEW OF SYSTEMS:   CONSTITUTIONAL: No fever/chills; positive for fatigue and generalized weakness.  EYES: No blurred or double vision.  EARS, NOSE, AND THROAT: No tinnitus or ear pain.  RESPIRATORY: No cough.  Positive for shortness of breath and wheezing; no  hemoptysis.  CARDIOVASCULAR: Positive for chest pain, orthopnea and edema.  GASTROINTESTINAL: No nausea, vomiting, diarrhea or abdominal pain.  GENITOURINARY: No dysuria, hematuria.  ENDOCRINE: No polyuria, nocturia,  HEMATOLOGY: No easy bruising or bleeding SKIN: No rash or lesion. MUSCULOSKELETAL: No joint pain. NEUROLOGIC: No focal weakness.  PSYCHIATRY: No anxiety or depression.   MEDICATIONS AT HOME:  Prior to Admission medications   Medication Sig Start Date End Date Taking? Authorizing Provider  acetaminophen (TYLENOL) 325 MG tablet Take 2 tablets (650 mg total) by mouth every 6 (six) hours as needed for mild pain (or Fever >/= 101). Patient taking differently: Take 500 mg by mouth every 6 (six) hours as needed for mild pain (or Fever >/= 101).  09/21/16  Yes Gouru, Aruna, MD  albuterol (PROVENTIL HFA;VENTOLIN HFA) 108 (90 Base) MCG/ACT inhaler Inhale into the lungs every 4 (four) hours as needed for wheezing or shortness of breath.   Yes [provider]  aspirin 81 MG chewable tablet Chew 81 mg by mouth. 07/21/16  Yes [provider]  atorvastatin (LIPITOR) 40 MG tablet Take 1 tablet (40 mg total) by mouth daily. 02/19/17  Yes Demetrios Loll, MD  calcium acetate (PHOSLO) 667 MG capsule Take 1,334 mg by mouth 3 (three) times daily with meals. Take 2001 mgs with meals 3 times daily and 1334 with snacks 12/11/15  Yes [provider]  carvedilol (COREG) 6.25 MG tablet Take 1 tablet (6.25 mg total) by mouth 2 (two) times daily with a meal. 09/21/16  Yes Gouru, Aruna, MD  cinacalcet (SENSIPAR) 90 MG tablet Take 90 mg by mouth daily.   Yes [provider]  furosemide (LASIX) 40 MG tablet Take 1 tablet (40 mg total) by mouth 2 (two) times daily. 02/18/17  Yes Demetrios Loll, MD  hydrALAZINE (APRESOLINE) 50 MG tablet Take 100 mg by mouth 3 (three) times daily.  07/01/16  Yes [provider]  ipratropium (ATROVENT HFA) 17 MCG/ACT inhaler Inhale 2 puffs into the  lungs every 4 (four) hours as needed for wheezing.   Yes [provider]  isosorbide mononitrate (IMDUR) 30 MG 24 hr tablet Take 1 tablet (30 mg total) by mouth daily. Patient taking differently: Take 30 mg by mouth 2 (two) times daily.  08/24/16 02/17/18 Yes Dustin Flock, MD  lidocaine-prilocaine (EMLA) cream Apply 1 application topically as needed (dialysis access).   Yes [provider]  multivitamin (RENA-VIT) TABS tablet Take 1 tablet by mouth daily.   Yes [provider]  nitroGLYCERIN (NITROSTAT) 0.4 MG SL tablet Place 0.4 mg under the tongue every 5 (five) minutes as needed for chest pain.   Yes [provider]  sacubitril-valsartan (ENTRESTO) 24-26 MG Take 1 tablet by mouth 2 (two) times daily. 10/05/16  Yes Hackney, Otila Kluver A, FNP      PHYSICAL EXAMINATION:   VITAL SIGNS: Blood pressure (!) 148/94, pulse (!) 42, temperature 97.9 F (36.6 C), temperature source Oral, resp. rate 17, weight 71.1 kg (156 lb 11.2 oz), SpO2 100 %.  GENERAL:  62 y.o.-year-old patient lying in the bed with no acute distress.  EYES: Pupils equal, round, reactive to light and accommodation. No scleral icterus. Extraocular muscles intact.  HEENT: Head atraumatic, normocephalic. Oropharynx and nasopharynx clear.  NECK:  Supple, no jugular venous distention. No thyroid enlargement, no tenderness.  LUNGS: Reduced breath sounds bilaterally. Currently, no wheezing, rales, s/p HD. No use of accessory muscles of respiration.  CARDIOVASCULAR: S1, S2 normal. No murmurs, rubs, or gallops.  ABDOMEN: Soft, nontender, nondistended. Bowel sounds present. No organomegaly or mass.  EXTREMITIES: No pedal edema, cyanosis, or clubbing.  NEUROLOGIC: No focal weakness. Gait not checked.  PSYCHIATRIC: The patient is alert and oriented x 3.  SKIN: No obvious rash, lesion, or ulcer.   LABORATORY PANEL:   CBC Recent Labs  Lab 03/13/17 1514  WBC 6.4  HGB 12.0*  HCT 37.6*  PLT 132*  MCV 94.6   MCH 30.2  MCHC 32.0  RDW 18.1*   ------------------------------------------------------------------------------------------------------------------  Chemistries  Recent Labs  Lab 03/13/17 1514  NA 139  K 5.0  CL 100*  CO2 21*  GLUCOSE 133*  BUN 80*  CREATININE 7.74*  CALCIUM 9.0  AST 32  ALT 34  ALKPHOS 98  BILITOT 0.9   ------------------------------------------------------------------------------------------------------------------ estimated creatinine clearance is 9.6 mL/min (A) (by C-G formula based on SCr of 7.74 mg/dL (H)). ------------------------------------------------------------------------------------------------------------------ No results for input(s): TSH, T4TOTAL, T3FREE, THYROIDAB in the last 72 hours.  Invalid input(s): FREET3   Coagulation profile No results for input(s): INR, PROTIME in the last 168  hours. ------------------------------------------------------------------------------------------------------------------- No results for input(s): DDIMER in the last 72 hours. -------------------------------------------------------------------------------------------------------------------  Cardiac Enzymes Recent Labs  Lab 03/13/17 1514  TROPONINI 0.19*   ------------------------------------------------------------------------------------------------------------------ Invalid input(s): POCBNP  ---------------------------------------------------------------------------------------------------------------  Urinalysis    Component Value Date/Time   COLORURINE Straw 11/05/2012 2135   APPEARANCEUR Clear 11/05/2012 2135   LABSPEC 1.005 11/05/2012 2135   PHURINE 7.0 11/05/2012 2135   GLUCOSEU 50 mg/dL 11/05/2012 2135   HGBUR 1+ 11/05/2012 2135   BILIRUBINUR Negative 11/05/2012 2135   KETONESUR Negative 11/05/2012 2135   PROTEINUR 25 mg/dL 11/05/2012 2135   NITRITE Negative 11/05/2012 2135   LEUKOCYTESUR Negative 11/05/2012 2135      RADIOLOGY: Dg Chest 2 View  Result Date: 03/13/2017 CLINICAL DATA:  Cough.  Shortness of breath. EXAM: CHEST  2 VIEW COMPARISON:  January 27, 2017 FINDINGS: A double lumen right central line is identified. No pneumothorax. Stable cardiomegaly. The hila and mediastinum are unremarkable. No pulmonary nodules or masses. No focal infiltrates. No overt edema. IMPRESSION: No active cardiopulmonary disease. Electronically Signed   By: Dorise Bullion III M.D   On: 03/13/2017 15:50    EKG: Orders placed or performed during the hospital encounter of 03/13/17  . ED EKG  . ED EKG  . ED EKG  . ED EKG    IMPRESSION AND PLAN:  1.  Acute hypoxemic respiratory failure, likely secondary to pulmonary edema and possibly COPD exacerbation.  Patient was treated with emergency hemodialysis, steroids and nebulizer treatment.  He is currently, clinically much improved.  We will continue to monitor closely. 2.  Acute pulmonary edema.  Patient was treated with emergency hemodialysis.  Per nephrology, will continue with his regular dialysis schedule, Monday, Wednesday and Friday. 3.  Non-ST elevation MI.  Initial troponin level was elevated at 0.19.  This is likely related to pulmonary edema and end-stage renal disease.  However, we will monitor EKG and troponin level to rule out acute coronary syndrome. 4.  Acute COPD exacerbation, much improved status post nebulizer treatment and steroids.  Will continue home maintenance medications. 5.  Hypertension, improved after hemodialysis.  We will restart home medications. 6.  Sinus tachycardia, status post hemodialyses and nebulizer treatment.  Resolved.  Continue to monitor on telemetry. 7.  End-stage renal disease, on HD.    All the records are reviewed and case discussed with ED provider. Management plans discussed with the patient, family and they are in agreement.  CODE STATUS:    Code Status Orders  (From admission, onward)        Start     Ordered    03/14/17 0007  Full code  Continuous     03/14/17 0006    Code Status History    Date Active Date Inactive Code Status Order ID Comments User Context   02/15/2017 21:36 02/18/2017 18:12 Full Code [id number]  Demetrios Loll, MD Inpatient   09/20/2016 03:18 09/21/2016 19:15 Full Code [id number]  Lance Coon, MD ED   08/22/2016 16:58 08/24/2016 16:07 Full Code [id number]  Demetrios Loll, MD Inpatient   07/04/2016 19:29 07/05/2016 17:28 Full Code [id number]  Idelle Crouch, MD Inpatient   06/07/2016 01:02 06/08/2016 15:14 Full Code [id number]  Harvie Bridge, DO Inpatient   04/30/2016 00:07 05/01/2016 18:21 Full Code [id number]  Lance Coon, MD Inpatient   02/26/2016 21:07 02/28/2016 18:04 Full Code [id number]  Lance Coon, MD Inpatient   01/07/2016 01:32 01/07/2016 17:24 Full Code [id number]  Harvie Bridge, DO ED   08/02/2015 23:16 08/04/2015 23:11 Full Code [id number]  Quintella Baton, MD Inpatient   12/02/2014 20:52 12/04/2014 17:53 Full Code [id number]  Vaughan Basta, MD Inpatient   11/10/2014 23:23 11/12/2014 15:06 Full Code [id number]  Gladstone Lighter, MD Inpatient       TOTAL TIME TAKING CARE OF THIS PATIENT: 40 minutes.    Amelia Jo M.D on 03/14/2017 at 12:48 AM  Between 7am to 6pm - Pager - [phone]  After 6pm go to [email or web address] - password [email or web address] [facility]  Office  [phone]  CC: Primary care physician; [facility] [place]

## 2017-03-14 NOTE — Discharge Instructions (Signed)
Resume hemodialysis as before, better oxygen as before

## 2017-03-14 NOTE — Progress Notes (Signed)
HD tx end  

## 2017-03-14 NOTE — Discharge Summary (Signed)
[facility] NAME: [name]    MR#:  [id number]  DATE OF BIRTH:  11-19-1955  DATE OF ADMISSION:  03/13/2017 ADMITTING PHYSICIAN: Amelia Jo, MD  DATE OF DISCHARGE: 03/14/2017  PRIMARY CARE PHYSICIAN: [facility] [place]    ADMISSION DIAGNOSIS:  End stage renal disease ([place]) [N18.6] Respiratory distress [R06.03] Chest pain, unspecified type [R07.9]  DISCHARGE DIAGNOSIS:  Acute hypoxic respiratory failure secondary to pulmonary edema/volume overload--improved End-stage renal disease on hemodialysis Frequent PVCs/ventricular bigeminy now on amiodarone SECONDARY DIAGNOSIS:   Past Medical History:  Diagnosis Date  . Arthritis   . Chest pain    a. 09/2013 Myoview [facility]): basal inf defect more pronounced @ rest, likely artifact, EF 48%, low risk study.  . Chronic combined systolic and diastolic CHF (congestive heart failure) ([place])    a. 10/2013 Echo [facility]): EF 50%, diast dysfxn, triv AI/TR, mild MR;  b. 10/2014 Echo: EF 30-35%, diff HK, gr1 DD, mild AI, mild to mod MR/TR, mod dil LA, nl RV, mod to sev PAH;  c. 04/2016 Echo: EF 20-25%, sev dil LV, diff HK, gr3 DD, mod AI, MV syst bowing w/ prolapse, mod dil RV, mod TR.  . Diabetes ([place])   . Dialysis patient ([place])    Mon, Wed, Fri  . Dyspnea   . ESRD (end stage renal disease) ([place])    a. MWF dialysis @ [facility].  . Hepatitis    Patient is unsure type of Hepatitis he has  . Hypertension   . Hypertensive heart disease with CHF (congestive heart failure) ([place])   . Peripheral vascular disease ([place])   . Pulmonary hypertension ([place]) 04/2016  . Renal insufficiency     HOSPITAL COURSE:   [name]  is a 62 y.o. male with a known history of end-stage renal disease and chronic combined systolic and diastolic CHF with an ejection fraction around 25% per most recent 2D echo done in 2018.  Patient also has history of COPD, diabetes, diet-controlled;  hypertension, peripheral vascular disease and pulmonary hypertension.Patient presented to emergency room for acute onset of shortness of breath, wheezing and chest tightness, going on for the past 24 hours and progressively getting worse.   * Acute on chronic respiratory failure with hypoxia due to fluid overload and acute on chronic systolic and diastolic CHF ejection fraction 25%. - patient got emergent hemodialysis--3.6 L ultrafiltration  -We will continue dialysis today  -continue Lasix twice daily, continue Entresto, DuoNeb -Chronic home oxygen.  *Elevated troponin, due to demanding ischemia due to above.  *ESRD. Continue hemodialysis.  *Frequent PVCs/bigeminy -Evaluated by cardiology.  The recommend start patient on amiodarone loading and taper to 200 mg once a day.  He will follow-up closely with cardiology as outpatient  *Hypertension. -Continue carvedilol, Lasix, hydralazine, Imdur  Overall feels back to baseline.  Patient uses chronic home oxygen. Will discharge later to home after dialysis.  Patient agreeable   CONSULTS OBTAINED:  Treatment Team:  Anthonette Legato, MD Wellington Hampshire, MD  DRUG ALLERGIES:   Allergies  Allergen Reactions  . Sulfa Antibiotics Rash    DISCHARGE MEDICATIONS:   Allergies as of 03/14/2017      Reactions   Sulfa Antibiotics Rash      Medication List    TAKE these medications   acetaminophen 325 MG tablet Commonly known as:  TYLENOL Take 2 tablets (650 mg total) by mouth every 6 (six) hours as needed for mild pain (or  Fever >/= 101). What changed:  how much to take   albuterol 108 (90 Base) MCG/ACT inhaler Commonly known as:  PROVENTIL HFA;VENTOLIN HFA Inhale into the lungs every 4 (four) hours as needed for wheezing or shortness of breath.   amiodarone 200 MG tablet Commonly known as:  PACERONE Take 400 mg twice daily for 1 week, then 200 mg twice daily for 1 week, then 200 mg daily thereafter   aspirin 81 MG chewable  tablet Chew 81 mg by mouth.   atorvastatin 40 MG tablet Commonly known as:  LIPITOR Take 1 tablet (40 mg total) by mouth daily.   calcium acetate 667 MG capsule Commonly known as:  PHOSLO Take 1,334 mg by mouth 3 (three) times daily with meals. Take 2001 mgs with meals 3 times daily and 1334 with snacks   carvedilol 6.25 MG tablet Commonly known as:  COREG Take 1 tablet (6.25 mg total) by mouth 2 (two) times daily with a meal.   cinacalcet 90 MG tablet Commonly known as:  SENSIPAR Take 90 mg by mouth daily.   furosemide 40 MG tablet Commonly known as:  LASIX Take 1 tablet (40 mg total) by mouth 2 (two) times daily.   hydrALAZINE 50 MG tablet Commonly known as:  APRESOLINE Take 100 mg by mouth 3 (three) times daily.   ipratropium 17 MCG/ACT inhaler Commonly known as:  ATROVENT HFA Inhale 2 puffs into the lungs every 4 (four) hours as needed for wheezing.   isosorbide mononitrate 30 MG 24 hr tablet Commonly known as:  IMDUR Take 1 tablet (30 mg total) by mouth daily. What changed:  when to take this   lidocaine-prilocaine cream Commonly known as:  EMLA Apply 1 application topically as needed (dialysis access).   multivitamin Tabs tablet Take 1 tablet by mouth daily.   nitroGLYCERIN 0.4 MG SL tablet Commonly known as:  NITROSTAT Place 0.4 mg under the tongue every 5 (five) minutes as needed for chest pain.   sacubitril-valsartan 24-26 MG Commonly known as:  ENTRESTO Take 1 tablet by mouth 2 (two) times daily.       If you experience worsening of your admission symptoms, develop shortness of breath, life threatening emergency, suicidal or homicidal thoughts you must seek medical attention immediately by calling 911 or calling your MD immediately  if symptoms less severe.  You Must read complete instructions/literature along with all the possible adverse reactions/side effects for all the Medicines you take and that have been prescribed to you. Take any new  Medicines after you have completely understood and accept all the possible adverse reactions/side effects.   Please note  You were cared for by a hospitalist during your hospital stay. If you have any questions about your discharge medications or the care you received while you were in the hospital after you are discharged, you can call the unit and asked to speak with the hospitalist on call if the hospitalist that took care of you is not available. Once you are discharged, your primary care physician will handle any further medical issues. Please note that NO REFILLS for any discharge medications will be authorized once you are discharged, as it is imperative that you return to your primary care physician (or establish a relationship with a primary care physician if you do not have one) for your aftercare needs so that they can reassess your need for medications and monitor your lab values. Today   SUBJECTIVE   Doing well  VITAL SIGNS:  Blood pressure [name])  131/59, pulse 82, temperature 98.2 F (36.8 C), resp. rate 18, height 5\' 8"  (1.727 m), weight 71.4 kg (157 lb 8 oz), SpO2 100 %.  I/O:    Intake/Output Summary (Last 24 hours) at 03/14/2017 1320 Last data filed at 03/14/2017 1046 Gross per 24 hour  Intake 360 ml  Output 3609 ml  Net -3249 ml    PHYSICAL EXAMINATION:  GENERAL:  62 y.o.-year-old patient lying in the bed with no acute distress.  EYES: Pupils equal, round, reactive to light and accommodation. No scleral icterus. Extraocular muscles intact.  HEENT: Head atraumatic, normocephalic. Oropharynx and nasopharynx clear.  NECK:  Supple, no jugular venous distention. No thyroid enlargement, no tenderness.  LUNGS: Normal breath sounds bilaterally, no wheezing, rales,rhonchi or crepitation. No use of accessory muscles of respiration. Perm cath+ CARDIOVASCULAR: S1, S2 normal. No murmurs, rubs, or gallops.  ABDOMEN: Soft, non-tender, non-distended. Bowel sounds present. No  organomegaly or mass.  EXTREMITIES: No pedal edema, cyanosis, or clubbing.  NEUROLOGIC: Cranial nerves II through XII are intact. Muscle strength 5/5 in all extremities. Sensation intact. Gait not checked.  PSYCHIATRIC: The patient is alert and oriented x 3.  SKIN: No obvious rash, lesion, or ulcer.   DATA REVIEW:   CBC  Recent Labs  Lab 03/14/17 0106  WBC 5.9  HGB 11.3*  HCT 34.5*  PLT 135*    Chemistries  Recent Labs  Lab 03/13/17 1514 03/14/17 0106  NA 139 137  K 5.0 4.6  CL 100* 98*  CO2 21* 24  GLUCOSE 133* 302*  BUN 80* 45*  CREATININE 7.74* 5.22*  CALCIUM 9.0 8.7*  AST 32  --   ALT 34  --   ALKPHOS 98  --   BILITOT 0.9  --     Microbiology Results   Recent Results (from the past 240 hour(s))  MRSA PCR Screening     Status: None   Collection Time: 03/14/17 12:37 AM  Result Value Ref Range Status   MRSA by PCR NEGATIVE NEGATIVE Final    Comment:        The GeneXpert MRSA Assay (FDA approved for NASAL specimens only), is one component of a comprehensive MRSA colonization surveillance program. It is not intended to diagnose MRSA infection nor to guide or monitor treatment for MRSA infections. Performed at [facility], [address], [place], [place] [address]     RADIOLOGY:  Dg Chest 2 View  Result Date: 03/13/2017 CLINICAL DATA:  Cough.  Shortness of breath. EXAM: CHEST  2 VIEW COMPARISON:  January 27, 2017 FINDINGS: A double lumen right central line is identified. No pneumothorax. Stable cardiomegaly. The hila and mediastinum are unremarkable. No pulmonary nodules or masses. No focal infiltrates. No overt edema. IMPRESSION: No active cardiopulmonary disease. Electronically Signed   By: Dorise Bullion III M.D   On: 03/13/2017 15:50     Management plans discussed with the patient, family and they are in agreement.  CODE STATUS:     Code Status Orders  (From admission, onward)        Start     Ordered   03/14/17 0007  Full  code  Continuous     03/14/17 0006    Code Status History    Date Active Date Inactive Code Status Order ID Comments User Context   02/15/2017 21:36 02/18/2017 18:12 Full Code [id number]  Demetrios Loll, MD Inpatient   09/20/2016 03:18 09/21/2016 19:15 Full Code [id number]  Lance Coon, MD ED   08/22/2016 16:58 08/24/2016  16:07 Full Code [id number]  Demetrios Loll, MD Inpatient   07/04/2016 19:29 07/05/2016 17:28 Full Code [id number]  Idelle Crouch, MD Inpatient   06/07/2016 01:02 06/08/2016 15:14 Full Code [id number]  Harvie Bridge, DO Inpatient   04/30/2016 00:07 05/01/2016 18:21 Full Code [id number]  Lance Coon, MD Inpatient   02/26/2016 21:07 02/28/2016 18:04 Full Code [id number]  Lance Coon, MD Inpatient   01/07/2016 01:32 01/07/2016 17:24 Full Code [id number]  Harvie Bridge, DO ED   08/02/2015 23:16 08/04/2015 23:11 Full Code [id number]  Quintella Baton, MD Inpatient   12/02/2014 20:52 12/04/2014 17:53 Full Code [id number]  Vaughan Basta, MD Inpatient   11/10/2014 23:23 11/12/2014 15:06 Full Code [id number]  Gladstone Lighter, MD Inpatient      TOTAL TIME TAKING CARE OF THIS PATIENT: *40* minutes.    Fritzi Mandes M.D on 03/14/2017 at 1:20 PM  Between 7am to 6pm - Pager - [phone] After 6pm go to [email or web address] - password [email or web address] [facility]  Office  [phone]  CC: Primary care physician; [facility] [place]

## 2017-03-15 ENCOUNTER — Encounter
Admission: RE | Disposition: A | Discharge status: Home / Self Care | Payer: Self-pay | Source: Ambulatory Visit | Attending: Vascular Surgery

## 2017-03-15 ENCOUNTER — Ambulatory Visit
Admission: RE | Admit: 2017-03-15 | Discharge: 2017-03-15 | Disposition: A | Discharge status: Home / Self Care | Payer: Medicare Other | Source: Ambulatory Visit | Attending: Vascular Surgery | Admitting: Vascular Surgery

## 2017-03-15 DIAGNOSIS — I871 Compression of vein: Secondary | ICD-10-CM | POA: Diagnosis present

## 2017-03-15 DIAGNOSIS — N186 End stage renal disease: Secondary | ICD-10-CM | POA: Insufficient documentation

## 2017-03-15 DIAGNOSIS — Z882 Allergy status to sulfonamides status: Secondary | ICD-10-CM | POA: Insufficient documentation

## 2017-03-15 DIAGNOSIS — Z833 Family history of diabetes mellitus: Secondary | ICD-10-CM | POA: Insufficient documentation

## 2017-03-15 DIAGNOSIS — Z87891 Personal history of nicotine dependence: Secondary | ICD-10-CM | POA: Insufficient documentation

## 2017-03-15 DIAGNOSIS — Z9889 Other specified postprocedural states: Secondary | ICD-10-CM | POA: Diagnosis not present

## 2017-03-15 DIAGNOSIS — E1122 Type 2 diabetes mellitus with diabetic chronic kidney disease: Secondary | ICD-10-CM | POA: Insufficient documentation

## 2017-03-15 DIAGNOSIS — K759 Inflammatory liver disease, unspecified: Secondary | ICD-10-CM | POA: Diagnosis not present

## 2017-03-15 DIAGNOSIS — Z955 Presence of coronary angioplasty implant and graft: Secondary | ICD-10-CM | POA: Diagnosis not present

## 2017-03-15 DIAGNOSIS — I739 Peripheral vascular disease, unspecified: Secondary | ICD-10-CM | POA: Diagnosis not present

## 2017-03-15 DIAGNOSIS — I132 Hypertensive heart and chronic kidney disease with heart failure and with stage 5 chronic kidney disease, or end stage renal disease: Secondary | ICD-10-CM | POA: Insufficient documentation

## 2017-03-15 DIAGNOSIS — M1990 Unspecified osteoarthritis, unspecified site: Secondary | ICD-10-CM | POA: Insufficient documentation

## 2017-03-15 DIAGNOSIS — Z8249 Family history of ischemic heart disease and other diseases of the circulatory system: Secondary | ICD-10-CM | POA: Diagnosis not present

## 2017-03-15 DIAGNOSIS — Z992 Dependence on renal dialysis: Secondary | ICD-10-CM | POA: Insufficient documentation

## 2017-03-15 DIAGNOSIS — I5042 Chronic combined systolic (congestive) and diastolic (congestive) heart failure: Secondary | ICD-10-CM | POA: Diagnosis not present

## 2017-03-15 DIAGNOSIS — I272 Pulmonary hypertension, unspecified: Secondary | ICD-10-CM | POA: Insufficient documentation

## 2017-03-15 HISTORY — PX: UPPER EXTREMITY VENOGRAPHY: CATH118272

## 2017-03-15 LAB — POTASSIUM (ARMC VASCULAR LAB ONLY): Potassium (ARMC vascular lab): 4.1 (ref 3.5–5.1)

## 2017-03-15 SURGERY — UPPER EXTREMITY VENOGRAPHY
Anesthesia: Moderate Sedation | Laterality: Left

## 2017-03-15 MED ORDER — FENTANYL CITRATE (PF) 100 MCG/2ML IJ SOLN
INTRAMUSCULAR | Status: AC
  Filled 2017-03-15: qty 2

## 2017-03-15 MED ORDER — ONDANSETRON HCL 4 MG/2ML IJ SOLN: 4.0000 mg | Freq: Four times a day (QID) | INTRAMUSCULAR | Status: DC | PRN

## 2017-03-15 MED ORDER — HEPARIN SODIUM (PORCINE) 1000 UNIT/ML IJ SOLN
INTRAMUSCULAR | Status: AC
  Filled 2017-03-15: qty 1

## 2017-03-15 MED ORDER — FAMOTIDINE 20 MG PO TABS: 40.0000 mg | ORAL_TABLET | ORAL | Status: DC | PRN

## 2017-03-15 MED ORDER — IOPAMIDOL (ISOVUE-300) INJECTION 61%
INTRAVENOUS | Status: DC | PRN
  Administered 2017-03-15: 60 mL via INTRA_ARTERIAL

## 2017-03-15 MED ORDER — DEXTROSE 5 % IV SOLN
1.5000 g | Freq: Once | INTRAVENOUS | Status: AC
  Administered 2017-03-15: 09:00:00 via INTRAVENOUS

## 2017-03-15 MED ORDER — HYDROMORPHONE HCL 1 MG/ML IJ SOLN: 1.0000 mg | Freq: Once | INTRAMUSCULAR | Status: DC | PRN

## 2017-03-15 MED ORDER — MIDAZOLAM HCL 5 MG/5ML IJ SOLN
INTRAMUSCULAR | Status: AC
  Filled 2017-03-15: qty 5

## 2017-03-15 MED ORDER — METHYLPREDNISOLONE SODIUM SUCC 125 MG IJ SOLR: 125.0000 mg | INTRAMUSCULAR | Status: DC | PRN

## 2017-03-15 MED ORDER — HEPARIN SODIUM (PORCINE) 1000 UNIT/ML IJ SOLN
INTRAMUSCULAR | Status: DC | PRN
  Administered 2017-03-15: 4000 [IU] via INTRAVENOUS

## 2017-03-15 MED ORDER — FENTANYL CITRATE (PF) 100 MCG/2ML IJ SOLN
INTRAMUSCULAR | Status: AC
Start: 2017-03-15 — End: 2017-03-15
  Filled 2017-03-15: qty 2

## 2017-03-15 MED ORDER — FENTANYL CITRATE (PF) 100 MCG/2ML IJ SOLN
INTRAMUSCULAR | Status: DC | PRN
  Administered 2017-03-15 (×2): 50 ug via INTRAVENOUS

## 2017-03-15 MED ORDER — LIDOCAINE HCL (PF) 1 % IJ SOLN
INTRAMUSCULAR | Status: AC
  Filled 2017-03-15: qty 30

## 2017-03-15 MED ORDER — DEXTROSE 5 % IV SOLN
INTRAVENOUS | Status: AC
  Filled 2017-03-15: qty 1.5

## 2017-03-15 MED ORDER — SODIUM CHLORIDE 0.9 % IV SOLN
INTRAVENOUS | Status: DC
  Administered 2017-03-15: 09:00:00 via INTRAVENOUS

## 2017-03-15 MED ORDER — MIDAZOLAM HCL 2 MG/2ML IJ SOLN
INTRAMUSCULAR | Status: DC | PRN
  Administered 2017-03-15: 0.5 mg via INTRAVENOUS
  Administered 2017-03-15: 2 mg via INTRAVENOUS

## 2017-03-15 SURGICAL SUPPLY — 22 items
BALLN ATLAS 14X40X75 (BALLOONS) ×3
BALLN LUTONIX AV 12X40X75 (BALLOONS) ×3
BALLN ULTRVRSE 10X60X75 (BALLOONS) ×3
BALLN ULTRVRSE 4X80X75 (BALLOONS) ×3
BALLOON ATLAS 14X40X75 (BALLOONS) ×1 IMPLANT
BALLOON LUTONIX AV 12X40X75 (BALLOONS) ×1 IMPLANT
BALLOON ULTRVRSE 10X60X75 (BALLOONS) ×1 IMPLANT
BALLOON ULTRVRSE 4X80X75 (BALLOONS) ×1 IMPLANT
CATH BEACON 5 .035 65 KMP TIP (CATHETERS) ×3 IMPLANT
DEVICE PRESTO INFLATION (MISCELLANEOUS) ×3 IMPLANT
DEVICE TORQUE (MISCELLANEOUS) ×3 IMPLANT
GUIDEWIRE ANGLED .035 180CM (WIRE) ×3 IMPLANT
NEEDLE ENTRY 21GA 7CM ECHOTIP (NEEDLE) ×3 IMPLANT
NEEDLE PERC 18GX7CM (NEEDLE) ×3 IMPLANT
PACK ANGIOGRAPHY (CUSTOM PROCEDURE TRAY) ×3 IMPLANT
SET INTRO CAPELLA COAXIAL (SET/KITS/TRAYS/PACK) ×3 IMPLANT
SHEATH BRITE TIP 5FRX11 (SHEATH) ×3 IMPLANT
SHEATH BRITE TIP 6FRX5.5 (SHEATH) ×3 IMPLANT
SHEATH RAABE 7FR (SHEATH) ×3 IMPLANT
STENT LIFESTAR 14X60 (Permanent Stent) ×3 IMPLANT
SUT MNCRL AB 4-0 PS2 18 (SUTURE) ×3 IMPLANT
WIRE MAGIC TORQUE 260C (WIRE) ×3 IMPLANT

## 2017-03-15 NOTE — Op Note (Signed)
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  Catheter placement into superior vena cava  2.  Percutaneous transluminal angioplasty and stent placement left subclavian and innominate to 12 mm  3.  Percutaneous transluminal angioplasty left brachial vein to 4 mm    Anesthesia: Conscious sedation was administered by the interventional radiology RN under my direct supervision. IV Versed plus fentanyl were utilized. Continuous ECG, pulse oximetry and blood pressure was monitored throughout the entire procedure. Conscious sedation was administered for a total of 47 minutes.  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  Recent venogram suggested strictures of the central veins.  He is undergoing intervention for planning a second stage left upper extremity access.  The risks and benefits of been reviewed all questions answered patient has agreed to proceed.  Procedure:  Emmitte Surgeon a 62 y.o. male who was identified and appropriate procedural time out was performed.  The patient was then placed supine on the table and prepped and draped in the usual sterile fashion.  Ultrasound was used to evaluate the left brachial veins near the antecubital fossa.  1% lidocaine was infiltrated the soft tissues.  Brachial vein was noted to be echolucent and compressible indicating patency.  Image was recorded for the permanent record.  Access to the brachial vein was obtained under direct ultrasound visualization without difficulty.  Microwire  was then advanced followed by a micro-sheath.  Magic torque wire was then advanced.  Attempts at advancing the Magic torque wire into the inferior vena cava were unsuccessful and ultimately the tip of the wire was parked in the right atrium.  Initially a 6 [place] sheath was utilized and images were obtained.  Subsequently a 7 [place] Ansell sheath was advanced over the wire.  I could not advance it past the mid brachial level and therefore hand-injection of contrast was performed this demonstrated a stricture of the brachial vein which was treated with a 4 mm balloon angioplasty.  Following angioplasty I was able to advance the 7 French sheath into the distal subclavian vein.  Magnified imaging of the central veins was then performed.  Initially 8 mm balloon and subsequently a 10 mm Lutonix drug-eluting balloon was used antroplasty the anatomy and subclavian.  Improvement was not satisfactory and therefore a 14 mm x 60 mm life star stent was deployed across the strictures and postdilated with a 12 mm diameter Atlas balloon.  Follow-up imaging demonstrated excellent result with less than 10% residual stenosis.   Patient is now a suitable candidate for left upper extremity AV access and arrangements will be made for surgical placement of either a basilic transposition or an AV graft.  Disposition: Patient was taken to the recovery room in stable condition having tolerated the procedure well.  Belenda Cruise Niaya [place]ok 03/15/2017,3:12 PM

## 2017-03-16 ENCOUNTER — Ambulatory Visit (INDEPENDENT_AMBULATORY_CARE_PROVIDER_SITE_OTHER): Payer: Medicare Other | Admitting: Nurse Practitioner

## 2017-03-16 ENCOUNTER — Other Ambulatory Visit: Payer: Self-pay

## 2017-03-16 ENCOUNTER — Telehealth (INDEPENDENT_AMBULATORY_CARE_PROVIDER_SITE_OTHER): Payer: Self-pay | Admitting: Vascular Surgery

## 2017-03-16 ENCOUNTER — Encounter: Payer: Self-pay | Admitting: Vascular Surgery

## 2017-03-16 VITALS — BP 160/60 | HR 73 | Ht 68.0 in | Wt 164.8 lb

## 2017-03-16 DIAGNOSIS — I110 Hypertensive heart disease with heart failure: Secondary | ICD-10-CM

## 2017-03-16 DIAGNOSIS — I493 Ventricular premature depolarization: Secondary | ICD-10-CM

## 2017-03-16 DIAGNOSIS — I2510 Atherosclerotic heart disease of native coronary artery without angina pectoris: Secondary | ICD-10-CM | POA: Diagnosis not present

## 2017-03-16 DIAGNOSIS — I428 Other cardiomyopathies: Secondary | ICD-10-CM

## 2017-03-16 DIAGNOSIS — I5042 Chronic combined systolic (congestive) and diastolic (congestive) heart failure: Secondary | ICD-10-CM | POA: Diagnosis not present

## 2017-03-16 MED ORDER — SACUBITRIL-VALSARTAN 24-26 MG PO TABS: 1.0000 | ORAL_TABLET | Freq: Two times a day (BID) | ORAL | 3 refills | Status: DC

## 2017-03-16 NOTE — Telephone Encounter (Signed)
New Message  Pt verbalized he had procedure done yesterday and he is needing some pain medication because his arm is really hurting him.  Please f/u

## 2017-03-16 NOTE — Patient Instructions (Addendum)
Medication Instructions: - Your physician recommends that you continue on your current medications as directed. Please refer to the Current Medication list given to you today.  Labwork: - none ordered  Procedures/Testing: - Your physician has requested that you have a lexiscan myoview. For further information please visit [email or web address]. Please follow instruction sheet, as given.  [place]  Your caregiver has ordered a Stress Test with nuclear imaging. The purpose of this test is to evaluate the blood supply to your heart muscle. This procedure is referred to as a "Non-Invasive Stress Test." This is because other than having an IV started in your vein, nothing is inserted or "invades" your body. Cardiac stress tests are done to find areas of poor blood flow to the heart by determining the extent of coronary artery disease (CAD). Some patients exercise on a treadmill, which naturally increases the blood flow to your heart, while others who are  unable to walk on a treadmill due to physical limitations have a pharmacologic/chemical stress agent called Lexiscan . This medicine will mimic walking on a treadmill by temporarily increasing your coronary blood flow.   Please note: these test may take anywhere between 2-4 hours to complete  PLEASE REPORT TO [facility] AT THE FIRST DESK WILL DIRECT YOU WHERE TO GO  Date of Procedure:_____________________________________  Arrival Time for Procedure:______________________________  Instructions regarding medication:   _x___:  Hold betablocker(s) night before procedure and morning of procedure (cavedilol)  __x__:  Hold other medications as follows: lasix (furosemide) the morning of your procedure  PLEASE NOTIFY THE OFFICE AT LEAST 24 HOURS IN ADVANCE IF YOU ARE UNABLE TO KEEP YOUR APPOINTMENT.  [phone] AND  PLEASE NOTIFY NUCLEAR MEDICINE AT [facility] AT LEAST 24 HOURS IN ADVANCE IF YOU ARE UNABLE TO KEEP YOUR  APPOINTMENT. [phone]  How to prepare for your Myoview test:  1. Do not eat or drink after midnight 2. No caffeine for 24 hours prior to test 3. No smoking 24 hours prior to test. 4. Your medication may be taken with water.  If your doctor stopped a medication because of this test, do not take that medication. 5. Ladies, please do not wear dresses.  Skirts or pants are appropriate. Please wear a short sleeve shirt. 6. No perfume, cologne or lotion. 7. Wear comfortable walking shoes. No heels!   Follow-Up: - You have been referred to Dr. Caryl Comes- evaluation of PVC's   - Your physician recommends that you schedule a follow-up appointment in: 1 month with Dr. Saunders Revel (only)   Any Additional Special Instructions Will Be Listed Below (If Applicable).     If you need a refill on your cardiac medications before your next appointment, please call your pharmacy.

## 2017-03-16 NOTE — Progress Notes (Signed)
Office Visit    Patient Name: [name] Date of Encounter: 03/16/2017  Primary Care Provider:  [facility] [place] Primary Cardiologist:  Nelva Bush, MD  Chief Complaint    62 year old male with a history of mixed ischemic and nonischemic cardiomyopathy, coronary artery disease, chronic combined congestive heart failure, end-stage renal disease on dialysis, frequent PVCs with ventricular bigeminy, anemia of chronic disease, pulmonary arterial hypertension, hypertensive heart disease, and peripheral vascular disease who presents for follow-up after recent admission.  Past Medical History    Past Medical History:  Diagnosis Date  . Arthritis   . CAD (coronary artery disease)    a. 09/2013 My[facility]): basal inf defect more pronounced @ rest, likely artifact, EF 48%, low risk study; b. 06/2016 MV: LAD 10p, 30-40m, 40-50d, D1 90(small), D2 40, D3 60-70, D4 30, LCX 10p, 38m, 20d, Om1 30, OM3 40, RCA 100p w/ bridging collats, m/d RCA fill via collats, small/mod caliber-->Med Rx.  . Chronic combined systolic and diastolic CHF (congestive heart failure) ([place])    a. 10/2013 Echo [facility]): EF 50%, diast dysfxn;  b. 10/2014 Echo: EF 30-35%, diff HK, gr1 DD;  c. 04/2016 Echo: EF 20-25%, sev dil LV, diff HK, gr3 DD; d. 06/2016 Echo: EF 30-35%, Gr2 DD.  . Diabetes ([place])   . Dyspnea   . ESRD (end stage renal disease) ([place])    a. MWF dialysis @ [facility].  . Hepatitis    Patient is unsure type of Hepatitis he has  . Hypertensive heart disease with CHF (congestive heart failure) ([place])   . Mixed Ischemic and non-ischemic cardiomyopathy    a. 10/2013 Echo [facility]): EF 50%, diast dysfxn;  b. 10/2014 Echo: EF 30-35%, diff HK, gr1 DD;  c. 04/2016 Echo: EF 20-25%, sev dil LV, diff HK, gr3 DD; d. 06/2016 Echo: EF 30-35%, Gr2 DD.  [name] Moderate Aortic insufficiency    a. 04/2016 Echo: Mod AI.  [name] Peripheral vascular disease ([place])   . Pulmonary hypertension ([place])    a. 10/2014 Echo:  Mod-Sev PAH.  [name] PVC's (premature ventricular contractions)    a. 02/2017 24 hr holter: freq polymorphic PVCs w/ total of 38K beats in 24 hrs (34% burden).   Past Surgical History:  Procedure Laterality Date  . A/V FISTULAGRAM Left 04/06/2016   Procedure: A/V Fistulagram;  Surgeon: Katha Cabal, MD;  Location: [place] [facility];  Service: Cardiovascular;  Laterality: Left;  . A/V FISTULAGRAM Left 01/19/2017   Procedure: A/V FISTULAGRAM;  Surgeon: Katha Cabal, MD;  Location: [place] [facility];  Service: Cardiovascular;  Laterality: Left;  . A/V SHUNT INTERVENTION N/A 04/06/2016   Procedure: A/V Shunt Intervention;  Surgeon: Katha Cabal, MD;  Location: [place] [facility];  Service: Cardiovascular;  Laterality: N/A;  . A/V SHUNT INTERVENTION N/A 06/29/2016   Procedure: A/V Shunt Intervention;  Surgeon: Katha Cabal, MD;  Location: [place] [facility];  Service: Cardiovascular;  Laterality: N/A;  . AV FISTULA PLACEMENT Left 2014  . DIALYSIS/PERMA CATHETER INSERTION Right    Dr. Delana Meyer  . PERIPHERAL VASCULAR CATHETERIZATION N/A 10/03/2014   Procedure: A/V Shuntogram/Fistulagram;  Surgeon: Algernon Huxley, MD;  Location: [place] [facility];  Service: Cardiovascular;  Laterality: N/A;  . PERIPHERAL VASCULAR CATHETERIZATION Left 10/03/2014   Procedure: A/V Shunt Intervention;  Surgeon: Algernon Huxley, MD;  Location: [place] [facility];  Service: Cardiovascular;  Laterality: Left;  . PERIPHERAL VASCULAR CATHETERIZATION Left 05/01/2015   Procedure: A/V  Shuntogram/Fistulagram;  Surgeon: Algernon Huxley, MD;  Location: [place] [facility];  Service: Cardiovascular;  Laterality: Left;  . PERIPHERAL VASCULAR CATHETERIZATION N/A 05/01/2015   Procedure: A/V Shunt Intervention;  Surgeon: Algernon Huxley, MD;  Location: [place] [facility];  Service: Cardiovascular;  Laterality: N/A;  . UPPER EXTREMITY ANGIOGRAPHY Bilateral 06/29/2016   Procedure: Upper Extremity Angiography;  Surgeon: Katha Cabal, MD;  Location: [place] [facility];  Service: Cardiovascular;  Laterality: Bilateral;  . UPPER EXTREMITY INTERVENTION  06/29/2016   Procedure: Upper Extremity Intervention;  Surgeon: Katha Cabal, MD;  Location: [place] [facility];  Service: Cardiovascular;;  . UPPER EXTREMITY VENOGRAPHY Left 03/15/2017   Procedure: UPPER EXTREMITY VENOGRAPHY;  Surgeon: Katha Cabal, MD;  Location: [place] [facility];  Service: Cardiovascular;  Laterality: Left;    Allergies  Allergies  Allergen Reactions  . Sulfa Antibiotics Rash    History of Present Illness    62 year old male with the above complex past medical history including mixed ischemic and nonischemic cardiomyopathy, combined heart failure, coronary artery disease, hypertension, hyperlipidemia, pulmonary hypertension, PAD, and end-stage renal disease on Monday, Wednesday, Friday dialysis.  Cardiac history dates back to 2015 when he underwent a stress testing in the setting of renal transplant evaluation.  Stress testing was low risk at that time.  He had an echocardiogram in 2016 which showed LV dysfunction with an EF of 30-35%.  Echo in March 2018 showed an EF of 20-25%.  In May 2018, he was admitted to [facility] where echo showed persistent LV dysfunction.  Catheterization was undertaken and revealed an occluded right coronary artery with left-to-right collaterals.  He has been medically managed since.  He has had multiple admissions in the setting of acute pulmonary edema over the past year.  In December 2018, he was noted to have frequent PVCs during hospitalization and was placed on a Holter monitor   At discharge.  This subsequently showed 34,000 PVCs over 24-hour period, accounting for a 34% PVC burden.  Unfortunately, he was just readmitted January 20 with recurrent respiratory failure and acute pulmonary edema requiring somewhat urgent dialysis.  He was seen by our team during admission in the setting of mild troponin elevation  with a peak of 1.45.  Because of ongoing frequent PVCs, he was placed on amiodarone and recommendation was made for outpatient stress testing.  He was discharged January 21, and subsequently underwent stenting of the left subclavian and innominate arteries, PTA of the left brachial vein, and dialysis catheter placement on January 22.  This was performed as an outpatient.  He comes to the office this afternoon having just had dialysis this morning.  He is tired and says he is a little bit short of breath walking in but denies chest pain, palpitations, PND, orthopnea, dizziness, syncope, edema, or early satiety.  He was unable to get his amiodarone and Entresto from the [facility] clinic yesterday.  He did take his blood pressure medicine this morning.  Home Medications    Prior to Admission medications   Medication Sig Start Date End Date Taking? Authorizing Provider  acetaminophen (TYLENOL) 325 MG tablet Take 2 tablets (650 mg total) by mouth every 6 (six) hours as needed for mild pain (or Fever >/= 101). Patient taking differently: Take 500 mg by mouth every 6 (six) hours as needed for mild pain (or Fever >/= 101).  09/21/16   Gouru, Aruna, MD  albuterol (PROVENTIL HFA;VENTOLIN HFA) 108 (90 Base) MCG/ACT inhaler  Inhale into the lungs every 4 (four) hours as needed for wheezing or shortness of breath.    [provider]  amiodarone (PACERONE) 200 MG tablet Take 400 mg twice daily for 1 week, then 200 mg twice daily for 1 week, then 200 mg daily thereafter Patient not taking: Reported on 03/15/2017 03/14/17   Fritzi Mandes, MD  aspirin 81 MG chewable tablet Chew 81 mg by mouth. 07/21/16   [provider]  atorvastatin (LIPITOR) 40 MG tablet Take 1 tablet (40 mg total) by mouth daily. 02/19/17   Demetrios Loll, MD  calcium acetate (PHOSLO) 667 MG capsule Take 1,334 mg by mouth 3 (three) times daily with meals. Take 2001 mgs with meals 3 times daily and 1334 with snacks 12/11/15   [provider]  CALCIUM CARBONATE PO Take 1,000 mg by mouth 2 (two) times daily with a meal.    [provider]  carvedilol (COREG) 6.25 MG tablet Take 1 tablet (6.25 mg total) by mouth 2 (two) times daily with a meal. 09/21/16   Gouru, Aruna, MD  cinacalcet (SENSIPAR) 90 MG tablet Take 90 mg by mouth daily.    [provider]  furosemide (LASIX) 40 MG tablet Take 1 tablet (40 mg total) by mouth 2 (two) times daily. 02/18/17   Demetrios Loll, MD  hydrALAZINE (APRESOLINE) 50 MG tablet Take 100 mg by mouth 3 (three) times daily.  07/01/16   [provider]  ipratropium (ATROVENT HFA) 17 MCG/ACT inhaler Inhale 2 puffs into the lungs every 4 (four) hours as needed for wheezing.    [provider]  isosorbide mononitrate (IMDUR) 30 MG 24 hr tablet Take 1 tablet (30 mg total) by mouth daily. Patient taking differently: Take 30 mg by mouth 2 (two) times daily.  08/24/16 02/17/18  Dustin Flock, MD  lidocaine-prilocaine (EMLA) cream Apply 1 application topically as needed (dialysis access).    [provider]  losartan (COZAAR) 25 MG tablet Take 25 mg by mouth daily.    [provider]  multivitamin (RENA-VIT) TABS tablet Take 1 tablet by mouth daily.    [provider]  nitroGLYCERIN (NITROSTAT) 0.4 MG SL tablet Place 0.4 mg under the tongue every 5 (five) minutes as needed for chest pain.    [provider]  sacubitril-valsartan (ENTRESTO) 24-26 MG Take 1 tablet by mouth 2 (two) times daily. Patient not taking: Reported on 03/15/2017 10/05/16   Alisa Graff, FNP    Review of Systems    Feels tired and a little bit worn out after dialysis today.  He denies chest pain, palpitations, PND, orthopnea, dizziness, syncope, edema, or early satiety.  All other systems reviewed and are otherwise negative except as noted above.  Physical Exam    VS:  BP (!) 160/60 (BP Location: Left Arm, Patient Position: Sitting, Cuff Size: Normal)   Pulse 73    Ht 5\' 8"  (1.727 m)   Wt 164 lb 12 oz (74.7 kg)   BMI 25.05 kg/m  , BMI Body mass index is 25.05 kg/m. GEN: Well nourished, well developed, in no acute distress.  HEENT: normal.  Neck: Supple, no JVD, carotid bruits, or masses. Cardiac: RRR, 2/6 systolic murmur at the upper sternal borders, no rubs, or gallops. No clubbing, cyanosis, edema.  Radials/DP/PT 1+ and equal bilaterally.  Left upper extremity dialysis graft with bruit and thrill. Respiratory:  Respirations regular and unlabored, clear to auscultation bilaterally. GI: Soft, nontender, nondistended, BS + x 4. MS: no deformity  or atrophy. Skin: warm and dry, no rash. Neuro:  Strength and sensation are intact. Psych: Normal affect.  Accessory Clinical Findings    ECG -sinus rhythm, 73, ventricular bigeminy, left atrial enlargement, prior inferior infarct, LVH with repolarization of normalities  Assessment & Plan    1.  Chronic combined systolic and diastolic congestive heart failure/mixed ischemic and nonischemic cardiomyopathy: EF 30-35%.  Status post recent admission with discharge just 2 days ago.  Status post dialysis this morning.  He is very tired in the office today which is typical for him on a dialysis day.  Euvolemic on exam.  Pressure is elevated though he is due for another dose of hydralazine now.  He was prescribed Entresto at discharge but was unable to obtain this from [facility].  We will [facility] [place].  I did instruct him to stop losartan once he begins Entresto.  He otherwise remains on beta-blocker, hydralazine, and nitrate.  2.  Coronary artery disease: Status post catheterization in May 2018 revealing a total occlusion of the right coronary artery with moderate nonobstructive disease in the LAD.  Patient was recently admitted with acute pulmonary edema and had an elevated troponin.  He was seen by our team with recommendation for outpatient stress testing, which  I will arrange.  He has not had any chest discomfort since discharge.  He remains on low-dose aspirin, statin, beta-blocker.  3.  Frequent PVCs: Status post recent Holter monitor where he had 34,000 PVCs over 24 hours.  He was just placed on amiodarone during his recent hospitalization however has yet to pick this up from his pharmacy.  We did call [facility] and that prescription is now ready for him.  I will arrange for follow-up with electrophysiology.  4.  Hypertensive heart disease: Blood pressure elevated today.  He says he took his blood pressure medicine this morning but is due for another dose of hydralazine now.  He was switched to [facility] during hospitalization which should have an impact on his blood pressure and as noted above, we [facility]  5.  Hyperlipidemia: He remains on Lipitor therapy.  6.  End-stage renal disease: Followed closely by nephrology with Monday, Wednesday, Friday dialysis.  7.  Peripheral arterial disease: Status post left subclavian/innominate stenting yesterday.  Followed by vascular surgery.  8.  Disposition: I will arrange for electrophysiology follow-up related to frequent PVCs and known cardiomyopathy.  Follow-up with Dr. Saunders Revel in 1 month or sooner if necessary.  Murray Hodgkins, NP 03/16/2017, 4:57 PM

## 2017-03-17 ENCOUNTER — Telehealth: Payer: Self-pay | Admitting: Nurse Practitioner

## 2017-03-17 NOTE — Telephone Encounter (Signed)
I called and spoke with the patient to arrange his Blum. This has been arranged for 03/22/17 at 7:30 am. Verbal instructions for the test were reviewed with the patient. He was given a paper copy of instructions yesterday at his office visit.   He is aware to arrive at 7:15 am at [facility]- 1st desk on the right.

## 2017-03-22 ENCOUNTER — Encounter
Admission: RE | Admit: 2017-03-22 | Discharge: 2017-03-22 | Disposition: A | Discharge status: Home / Self Care | Payer: Medicare Other | Source: Ambulatory Visit | Attending: Nurse Practitioner | Admitting: Nurse Practitioner

## 2017-03-22 DIAGNOSIS — I2510 Atherosclerotic heart disease of native coronary artery without angina pectoris: Secondary | ICD-10-CM | POA: Diagnosis present

## 2017-03-22 LAB — NM MYOCAR MULTI W/SPECT W/WALL MOTION / EF
CHL CUP RESTING HR STRESS: 64 {beats}/min
CHL CUP STRESS STAGE 1 SPEED: 0 mph
CHL CUP STRESS STAGE 3 GRADE: 0 %
CHL CUP STRESS STAGE 3 SPEED: 0 mph
CHL CUP STRESS STAGE 4 SPEED: 0 mph
CSEPHR: 46 %
CSEPPHR: 70 {beats}/min
CSEPPMHR: 44 %
Estimated workload: 1 METS
LVDIAVOL: 368 mL (ref 62–150)
LVSYSVOL: 287 mL
Stage 1 Grade: 0 %
Stage 1 HR: 73 {beats}/min
Stage 2 Grade: 0 %
Stage 2 HR: 70 {beats}/min
Stage 2 Speed: 0 mph
Stage 3 HR: 70 {beats}/min
Stage 4 Grade: 0 %
Stage 4 HR: 74 {beats}/min
Stage 5 DBP: 55 mmHg
Stage 5 Grade: 0 %
Stage 5 HR: 72 {beats}/min
Stage 5 SBP: 115 mmHg
Stage 5 Speed: 0 mph
TID: 1.08

## 2017-03-22 MED ORDER — TECHNETIUM TC 99M TETROFOSMIN IV KIT
32.5750 | PACK | Freq: Once | INTRAVENOUS | Status: AC | PRN
  Administered 2017-03-22: 32.575 via INTRAVENOUS

## 2017-03-22 MED ORDER — REGADENOSON 0.4 MG/5ML IV SOLN
0.4000 mg | Freq: Once | INTRAVENOUS | Status: AC
  Administered 2017-03-22: 0.4 mg via INTRAVENOUS

## 2017-03-22 MED ORDER — TECHNETIUM TC 99M TETROFOSMIN IV KIT
13.0000 | PACK | Freq: Once | INTRAVENOUS | Status: AC | PRN
  Administered 2017-03-22: 13 via INTRAVENOUS

## 2017-03-23 ENCOUNTER — Telehealth (INDEPENDENT_AMBULATORY_CARE_PROVIDER_SITE_OTHER): Payer: Self-pay | Admitting: Vascular Surgery

## 2017-03-23 NOTE — Telephone Encounter (Signed)
Call for the plan of treatment for patient. She is wondering if the patient is to have a new access placed and if they can cannulate. I advised her that we will touch base with GS on 1/31 and contact her back.[name]

## 2017-03-24 ENCOUNTER — Encounter (INDEPENDENT_AMBULATORY_CARE_PROVIDER_SITE_OTHER): Payer: Self-pay

## 2017-03-24 NOTE — Telephone Encounter (Signed)
Patient was scheduled for surgery and information sent to the Dialysis center per their request.

## 2017-03-28 ENCOUNTER — Other Ambulatory Visit (INDEPENDENT_AMBULATORY_CARE_PROVIDER_SITE_OTHER): Payer: Self-pay | Admitting: Vascular Surgery

## 2017-03-31 ENCOUNTER — Other Ambulatory Visit: Payer: Self-pay

## 2017-03-31 ENCOUNTER — Encounter
Admission: RE | Admit: 2017-03-31 | Discharge: 2017-03-31 | Disposition: A | Discharge status: Home / Self Care | Payer: Medicare Other | Source: Ambulatory Visit | Attending: Vascular Surgery | Admitting: Vascular Surgery

## 2017-03-31 ENCOUNTER — Telehealth: Payer: Self-pay | Admitting: Internal Medicine

## 2017-03-31 DIAGNOSIS — Z01812 Encounter for preprocedural laboratory examination: Secondary | ICD-10-CM | POA: Insufficient documentation

## 2017-03-31 DIAGNOSIS — Z01810 Encounter for preprocedural cardiovascular examination: Secondary | ICD-10-CM | POA: Insufficient documentation

## 2017-03-31 DIAGNOSIS — I110 Hypertensive heart disease with heart failure: Secondary | ICD-10-CM | POA: Insufficient documentation

## 2017-03-31 DIAGNOSIS — I5042 Chronic combined systolic (congestive) and diastolic (congestive) heart failure: Secondary | ICD-10-CM | POA: Diagnosis not present

## 2017-03-31 HISTORY — DX: Acute myocardial infarction, unspecified: I21.9

## 2017-03-31 LAB — BASIC METABOLIC PANEL
ANION GAP: 16 — AB (ref 5–15)
BUN: 53 mg/dL — ABNORMAL HIGH (ref 6–20)
CALCIUM: 9.1 mg/dL (ref 8.9–10.3)
CO2: 21 mmol/L — ABNORMAL LOW (ref 22–32)
CREATININE: 6.72 mg/dL — AB (ref 0.61–1.24)
Chloride: 106 mmol/L (ref 101–111)
GFR calc Af Amer: 9 mL/min — ABNORMAL LOW (ref >60)
GFR, EST NON AFRICAN AMERICAN: 8 mL/min — AB (ref >60)
GLUCOSE: 94 mg/dL (ref 65–99)
Potassium: 4.4 mmol/L (ref 3.5–5.1)
Sodium: 143 mmol/L (ref 135–145)

## 2017-03-31 LAB — CBC WITH DIFFERENTIAL/PLATELET
BASOS ABS: 0.1 10*3/uL (ref 0–0.1)
BASOS PCT: 1 %
EOS ABS: 0.1 10*3/uL (ref 0–0.7)
EOS PCT: 2 %
HCT: 38.2 % — ABNORMAL LOW (ref 40.0–52.0)
Hemoglobin: 12.4 g/dL — ABNORMAL LOW (ref 13.0–18.0)
Lymphocytes Relative: 13 %
Lymphs Abs: 0.9 10*3/uL — ABNORMAL LOW (ref 1.0–3.6)
MCH: 30.8 pg (ref 26.0–34.0)
MCHC: 32.4 g/dL (ref 32.0–36.0)
MCV: 95 fL (ref 80.0–100.0)
MONO ABS: 1 10*3/uL (ref 0.2–1.0)
MONOS PCT: 16 %
NEUTROS ABS: 4.6 10*3/uL (ref 1.4–6.5)
Neutrophils Relative %: 68 %
PLATELETS: 209 10*3/uL (ref 150–440)
RBC: 4.02 MIL/uL — ABNORMAL LOW (ref 4.40–5.90)
RDW: 17.4 % — AB (ref 11.5–14.5)
WBC: 6.7 10*3/uL (ref 3.8–10.6)

## 2017-03-31 LAB — PROTIME-INR
INR: 1.04
PROTHROMBIN TIME: 13.5 s (ref 11.4–15.2)

## 2017-03-31 LAB — TYPE AND SCREEN
ABO/RH(D): O POS
Antibody Screen: NEGATIVE

## 2017-03-31 LAB — APTT: APTT: 32 s (ref 24–36)

## 2017-03-31 NOTE — Pre-Procedure Instructions (Signed)
Request for cardiac clearance faxed to cardiologist Harrell Gave End Md. Mickel Baas at [facility] aware also.

## 2017-03-31 NOTE — Telephone Encounter (Signed)
Routing to Dr End. 

## 2017-03-31 NOTE — Patient Instructions (Signed)
Your procedure is scheduled on: Friday 04/08/17 Report to [place]. To find out your arrival time please call [phone] between 1PM - 3PM on Thursday 04/07/17.  Remember: Instructions that are not followed completely may result in serious medical risk, up to and including death, or upon the discretion of your surgeon and anesthesiologist your surgery may need to be rescheduled.     _X__ 1. Do not eat food after midnight the night before your procedure.                 No gum chewing or hard candies. You may drink clear liquids up to 2 hours                 before you are scheduled to arrive for your surgery- DO not drink clear                 liquids within 2 hours of the start of your surgery.                 Clear Liquids include:  water, apple juice without pulp, clear carbohydrate                 drink such as Clearfast of [place]art[place], Black Coffee or Tea (Do not add                 anything to coffee or tea).  __X__2.  On the morning of surgery brush your teeth with toothpaste and water, you                 may rinse your mouth with mouthwash if you wish.  Do not swallow any              toothpaste of mouthwash.     _X__ 3.  No Alcohol for 24 hours before or after surgery.   _X__ 4.  Do Not Smoke or use e-cigarettes For 24 Hours Prior to Your Surgery.                 Do not use any chewable tobacco products for at least 6 hours prior to                 surgery.  ____  5.  Bring all medications with you on the day of surgery if instructed.   __X__  6.  Notify your doctor if there is any change in your medical condition      (cold, fever, infections).     Do not wear jewelry, make-up, hairpins, clips or nail polish. Do not wear lotions, powders, or perfumes. You may wear deodorant. Do not shave 48 hours prior to surgery. Men may shave face and neck. Do not bring valuables to the hospital.    [facility] is not responsible  for any belongings or valuables.  Contacts, dentures or bridgework may not be worn into surgery. Leave your suitcase in the car. After surgery it may be brought to your room. For patients admitted to the hospital, discharge time is determined by your treatment team.   Patients discharged the day of surgery will not be allowed to drive home.   Please read over the following fact sheets that you were given:   MRSA Information   __X__ Take these medicines the morning of surgery with A SIP OF WATER:    1. ATORVASTATIN  2. CARVEDILOL  3. HYDRALAZINE  4. ISOSORBIDE  5.  6.  ____ Fleet Enema (as directed)   __X__ Use CHG Soap as directed  __X__ Use inhalers on the day of surgery  ____ Stop metformin/Janumet/Farxiga 2 days prior to surgery    ____ Take 1/2 of usual insulin dose the night before surgery. No insulin the morning          of surgery.   __X__ Stop Blood Thinners Coumadin/Plavix/Xarelto/Pleta/Pradaxa/Eliquis/Effient/Aspirin  on ok TO CONTINUE ASPIRIN BUT DO NOT TAKE THE MORNING OF                     PROCEDURE  __X__ Stop Anti-inflammatories such as Advil, Ibuprofen, Motrin, BC or Goodies  Powder, Naprosyn, Naproxen, Aleve    __X__ Stop herbal supplements, fish oil or vitamin E until after surgery.    ____ Bring C-Pap to the hospital.

## 2017-03-31 NOTE — Telephone Encounter (Signed)
[name] cannot be cleared for elective surgery from a cardiology standpoint at this time. He was admitted with decompensated HF last month and was hypertension (in the setting of medication non-compliance) when he followed up with Ignacia Bayley, NP. Subsequent nuclear stress test was high risk. As I have not seen [name] since 05/2016, he will need to be seen by me or an APP prior to giving clearance.  Nelva Bush, MD [facility] Pager: [phone]

## 2017-03-31 NOTE — Telephone Encounter (Signed)
° °  [facility] Pre-operative Risk Assessment    Request for surgical clearance:  1. What type of surgery is being performed? Left Brachial Axillary graft creation  2. When is this surgery scheduled? 04/08/17  3. What type of clearance is required (medical clearance vs. Pharmacy clearance to hold med vs. Both)? Medical  4. Are there any medications that need to be held prior to surgery and how long?unknown 5. Practice name and name of physician performing surgery? [facility]   6. What is your office phone and fax number? Tel: [phone] Fax [phone]  7. Anesthesia type (None, local, MAC, general) ? unknown   [name] 03/31/2017, 10:39 AM  _________________________________________________________________   (provider comments below)

## 2017-04-01 NOTE — Pre-Procedure Instructions (Signed)
Met B and CBC results sent to Dr. [facility] and Anesthesia for review. 

## 2017-04-04 ENCOUNTER — Other Ambulatory Visit: Payer: Self-pay

## 2017-04-04 ENCOUNTER — Emergency Department
Admission: EM | Admit: 2017-04-04 | Discharge: 2017-04-04 | Disposition: A | Discharge status: Home / Self Care | Payer: Medicare Other | Attending: Emergency Medicine | Admitting: Emergency Medicine

## 2017-04-04 DIAGNOSIS — N186 End stage renal disease: Secondary | ICD-10-CM | POA: Diagnosis not present

## 2017-04-04 DIAGNOSIS — I132 Hypertensive heart and chronic kidney disease with heart failure and with stage 5 chronic kidney disease, or end stage renal disease: Secondary | ICD-10-CM | POA: Insufficient documentation

## 2017-04-04 DIAGNOSIS — I2510 Atherosclerotic heart disease of native coronary artery without angina pectoris: Secondary | ICD-10-CM | POA: Diagnosis not present

## 2017-04-04 DIAGNOSIS — Z992 Dependence on renal dialysis: Secondary | ICD-10-CM | POA: Diagnosis not present

## 2017-04-04 DIAGNOSIS — Z7982 Long term (current) use of aspirin: Secondary | ICD-10-CM | POA: Insufficient documentation

## 2017-04-04 DIAGNOSIS — Z79899 Other long term (current) drug therapy: Secondary | ICD-10-CM | POA: Insufficient documentation

## 2017-04-04 DIAGNOSIS — Z87891 Personal history of nicotine dependence: Secondary | ICD-10-CM | POA: Insufficient documentation

## 2017-04-04 DIAGNOSIS — E1122 Type 2 diabetes mellitus with diabetic chronic kidney disease: Secondary | ICD-10-CM | POA: Insufficient documentation

## 2017-04-04 DIAGNOSIS — I5042 Chronic combined systolic (congestive) and diastolic (congestive) heart failure: Secondary | ICD-10-CM | POA: Diagnosis not present

## 2017-04-04 DIAGNOSIS — R55 Syncope and collapse: Secondary | ICD-10-CM | POA: Diagnosis present

## 2017-04-04 LAB — CBC
HCT: 38.4 % — ABNORMAL LOW (ref 40.0–52.0)
HEMOGLOBIN: 12.6 g/dL — AB (ref 13.0–18.0)
MCH: 31.3 pg (ref 26.0–34.0)
MCHC: 32.9 g/dL (ref 32.0–36.0)
MCV: 95.4 fL (ref 80.0–100.0)
Platelets: 168 10*3/uL (ref 150–440)
RBC: 4.02 MIL/uL — ABNORMAL LOW (ref 4.40–5.90)
RDW: 17.2 % — ABNORMAL HIGH (ref 11.5–14.5)
WBC: 6.1 10*3/uL (ref 3.8–10.6)

## 2017-04-04 LAB — BASIC METABOLIC PANEL
ANION GAP: 13 (ref 5–15)
BUN: 46 mg/dL — ABNORMAL HIGH (ref 6–20)
CALCIUM: 8.9 mg/dL (ref 8.9–10.3)
CO2: 22 mmol/L (ref 22–32)
Chloride: 103 mmol/L (ref 101–111)
Creatinine, Ser: 5.94 mg/dL — ABNORMAL HIGH (ref 0.61–1.24)
GFR, EST AFRICAN AMERICAN: 11 mL/min — AB (ref >60)
GFR, EST NON AFRICAN AMERICAN: 9 mL/min — AB (ref >60)
GLUCOSE: 193 mg/dL — AB (ref 65–99)
POTASSIUM: 4.2 mmol/L (ref 3.5–5.1)
SODIUM: 138 mmol/L (ref 135–145)

## 2017-04-04 LAB — TROPONIN I: Troponin I: 0.14 ng/mL (ref <0.03)

## 2017-04-04 MED ORDER — ACETAMINOPHEN 325 MG PO TABS
650.0000 mg | ORAL_TABLET | Freq: Once | ORAL | Status: AC
  Administered 2017-04-04: 650 mg via ORAL
  Filled 2017-04-04: qty 2

## 2017-04-04 NOTE — ED Provider Notes (Signed)
[facility] Emergency Department Provider Note ____________________________________________   First MD Initiated Contact with Patient 04/04/17 2000     (approximate)  I have reviewed the triage vital signs and the nursing notes.   HISTORY  Chief Complaint Weakness    HPI Knowledge Escandon is a 62 y.o. male 62 year old male with past medical history of ESRD on dialysis and other PMH as noted below presents with near syncope, acute onset this evening after the patient ate, and now resolved.  The patient states that he had dialysis that ended in the late morning and was feeling fine all day today.  He states that he was eating a salad for dinner when he began to feel ill and went to the bathroom to vomit.  The patient states that he suddenly started to feel extremely lightheaded and weak, and felt like he was about to pass out.  He states that by the time EMS arrived he was "nearly blacked out."  Per EMS the patient did in fact have an episode of bowel incontinence.  Patient states that since he came to shortly afterwards, he has been feeling well, and states that he now feels "like I could run a marathon."  Patient denies any other recent illness within the last several days, and states his dialysis proceeded normally.   Past Medical History:  Diagnosis Date  . Arthritis   . CAD (coronary artery disease)    a. 09/2013 [facility]): basal inf defect more pronounced @ rest, likely artifact, EF 48%, low risk study; b. 06/2016 MV: LAD 10p, 30-9m, 40-50d, D1 90(small), D2 40, D3 60-70, D4 30, LCX 10p, 59m, 20d, Om1 30, OM3 40, RCA 100p w/ bridging collats, m/d RCA fill via collats, small/mod caliber-->Med Rx.  . Chronic combined systolic and diastolic CHF (congestive heart failure) ([place])    a. 10/2013 Echo [facility]): EF 50%, diast dysfxn;  b. 10/2014 Echo: EF 30-35%, diff HK, gr1 DD;  c. 04/2016 Echo: EF 20-25%, sev dil LV, diff HK, gr3 DD; d. 06/2016 Echo: EF 30-35%, Gr2 DD.  .  Diabetes ([place])   . Dyspnea   . ESRD (end stage renal disease) ([place])    a. MWF dialysis @ [facility].  . Hepatitis    Patient is unsure type of Hepatitis he has  . Hypertension   . Hypertensive heart disease with CHF (congestive heart failure) ([place])   . Mixed Ischemic and non-ischemic cardiomyopathy    a. 10/2013 Echo [facility]): EF 50%, diast dysfxn;  b. 10/2014 Echo: EF 30-35%, diff HK, gr1 DD;  c. 04/2016 Echo: EF 20-25%, sev dil LV, diff HK, gr3 DD; d. 06/2016 Echo: EF 30-35%, Gr2 DD.  [name] Moderate Aortic insufficiency    a. 04/2016 Echo: Mod AI.  [name] Myocardial infarction ([place])   . Peripheral vascular disease ([place])   . Pulmonary hypertension ([place])    a. 10/2014 Echo: Mod-Sev PAH.  [name] PVC's (premature ventricular contractions)    a. 02/2017 24 hr holter: freq polymorphic PVCs w/ total of 38K beats in 24 hrs (34% burden).    Patient Active Problem List   Diagnosis Date Noted  . Tachycardia 03/13/2017  . Acute respiratory failure with hypoxia ([place]) 02/15/2017  . Chronic systolic heart failure ([place]) 09/08/2016  . 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 Hyperkalemia 07/04/2016  . Abnormal EKG 07/04/2016  . Elevated troponin   . Chest pain 05/22/2016  . Sepsis ([place]) 02/26/2016  . HCAP (healthcare-associated pneumonia) 02/26/2016  . Diabetes ([place])  02/26/2016  . Community acquired pneumonia 01/07/2016  . Renal dialysis device, implant, or graft complication 16/11/9602  . Tobacco use 09/04/2015  . ESRD (end stage renal disease) on dialysis ([place]) 08/02/2015  . HTN (hypertension) 08/02/2015  . Pulmonary edema 12/02/2014    Past Surgical History:  Procedure Laterality Date  . A/V FISTULAGRAM Left 04/06/2016   Procedure: A/V Fistulagram;  Surgeon: Katha Cabal, MD;  Location: [place] CV [facility];  Service: Cardiovascular;  Laterality: Left;  . A/V FISTULAGRAM Left 01/19/2017   Procedure: A/V FISTULAGRAM;  Surgeon: Katha Cabal, MD;  Location: [place] [facility];  Service:  Cardiovascular;  Laterality: Left;  . A/V SHUNT INTERVENTION N/A 04/06/2016   Procedure: A/V Shunt Intervention;  Surgeon: Katha Cabal, MD;  Location: [place] [facility];  Service: Cardiovascular;  Laterality: N/A;  . A/V SHUNT INTERVENTION N/A 06/29/2016   Procedure: A/V Shunt Intervention;  Surgeon: Katha Cabal, MD;  Location: [place] [facility];  Service: Cardiovascular;  Laterality: N/A;  . AV FISTULA PLACEMENT Left 2014  . DIALYSIS/PERMA CATHETER INSERTION Right    Dr. Delana Meyer  . PERIPHERAL VASCULAR CATHETERIZATION N/A 10/03/2014   Procedure: A/V Shuntogram/Fistulagram;  Surgeon: Algernon Huxley, MD;  Location: [place] [facility];  Service: Cardiovascular;  Laterality: N/A;  . PERIPHERAL VASCULAR CATHETERIZATION Left 10/03/2014   Procedure: A/V Shunt Intervention;  Surgeon: Algernon Huxley, MD;  Location: [place] [facility];  Service: Cardiovascular;  Laterality: Left;  . PERIPHERAL VASCULAR CATHETERIZATION Left 05/01/2015   Procedure: A/V Shuntogram/Fistulagram;  Surgeon: Algernon Huxley, MD;  Location: [place] [facility];  Service: Cardiovascular;  Laterality: Left;  . PERIPHERAL VASCULAR CATHETERIZATION N/A 05/01/2015   Procedure: A/V Shunt Intervention;  Surgeon: Algernon Huxley, MD;  Location: [place] [facility];  Service: Cardiovascular;  Laterality: N/A;  . UPPER EXTREMITY ANGIOGRAPHY Bilateral 06/29/2016   Procedure: Upper Extremity Angiography;  Surgeon: Katha Cabal, MD;  Location: [place] [facility];  Service: Cardiovascular;  Laterality: Bilateral;  . UPPER EXTREMITY INTERVENTION  06/29/2016   Procedure: Upper Extremity Intervention;  Surgeon: Katha Cabal, MD;  Location: [place] [facility];  Service: Cardiovascular;;  . UPPER EXTREMITY VENOGRAPHY Left 03/15/2017   Procedure: UPPER EXTREMITY VENOGRAPHY;  Surgeon: Katha Cabal, MD;  Location: [place] [facility];  Service: Cardiovascular;  Laterality: Left;    Prior to Admission medications   Medication Sig  Start Date End Date Taking? Authorizing Provider  acetaminophen (TYLENOL) 325 MG tablet Take 2 tablets (650 mg total) by mouth every 6 (six) hours as needed for mild pain (or Fever >/= 101). Patient taking differently: Take 500 mg by mouth every 6 (six) hours as needed for mild pain (or Fever >/= 101).  09/21/16   Gouru, Aruna, MD  albuterol (PROVENTIL HFA;VENTOLIN HFA) 108 (90 Base) MCG/ACT inhaler Inhale 2 puffs into the lungs every 4 (four) hours as needed for wheezing or shortness of breath.     [provider]  amiodarone (PACERONE) 200 MG tablet Take 400 mg twice daily for 1 week, then 200 mg twice daily for 1 week, then 200 mg daily thereafter Patient not taking: Reported on 03/15/2017 03/14/17   Fritzi Mandes, MD  aspirin 81 MG chewable tablet Chew 81 mg by mouth. 07/21/16   [provider]  atorvastatin (LIPITOR) 40 MG tablet Take 1 tablet (40 mg total) by mouth daily. 02/19/17   Demetrios Loll, MD  calcium acetate (PHOSLO) 667 MG capsule Take 1,334 mg  by mouth 3 (three) times daily with meals. Take 2001 mgs with meals 3 times daily and 1334 with snacks 12/11/15   [provider]  CALCIUM CARBONATE PO Take 1,000 mg by mouth 2 (two) times daily with a meal.    [provider]  carvedilol (COREG) 6.25 MG tablet Take 1 tablet (6.25 mg total) by mouth 2 (two) times daily with a meal. 09/21/16   Gouru, Aruna, MD  cinacalcet (SENSIPAR) 90 MG tablet Take 90 mg by mouth daily.    [provider]  furosemide (LASIX) 40 MG tablet Take 1 tablet (40 mg total) by mouth 2 (two) times daily. 02/18/17   Demetrios Loll, MD  hydrALAZINE (APRESOLINE) 50 MG tablet Take 100 mg by mouth 3 (three) times daily.  07/01/16   [provider]  ipratropium (ATROVENT HFA) 17 MCG/ACT inhaler Inhale 2 puffs into the lungs every 4 (four) hours as needed for wheezing.    [provider]  isosorbide mononitrate (IMDUR) 30 MG 24 hr tablet Take 1 tablet (30 mg total) by mouth  daily. Patient taking differently: Take 30 mg by mouth 2 (two) times daily.  08/24/16 02/17/18  Dustin Flock, MD  lidocaine-prilocaine (EMLA) cream Apply 1 application topically as needed (dialysis access).    [provider]  losartan (COZAAR) 25 MG tablet Take 25 mg by mouth daily.    [provider]  multivitamin (RENA-VIT) TABS tablet Take 1 tablet by mouth daily.    [provider]  nitroGLYCERIN (NITROSTAT) 0.4 MG SL tablet Place 0.4 mg under the tongue every 5 (five) minutes as needed for chest pain.    [provider]  sacubitril-valsartan (ENTRESTO) 24-26 MG Take 1 tablet by mouth 2 (two) times daily. 03/16/17   Theora Gianotti, NP    Allergies Sulfa antibiotics  Family History  Problem Relation Age of Onset  . Hypertension Son   . Diabetes Son   . Hypertension Mother   . Diabetes Mother   . Diabetes Sister     Social History Social History   Tobacco Use  . Smoking status: Former Smoker    Packs/day: 0.25    Years: 40.00    Pack years: 10.00    Types: Cigarettes    Last attempt to quit: 10/12/2016    Years since quitting: 0.4  . Smokeless tobacco: Never Used  Substance Use Topics  . Alcohol use: No    Alcohol/week: 0.0 oz  . Drug use: Yes    Frequency: 1.0 times per week    Types: Cocaine, Marijuana    Comment: patient states no drugs    Review of Systems  Constitutional: No fever. Eyes: No visual changes. ENT: No sore throat. Cardiovascular: Denies chest pain. Respiratory: Denies shortness of breath. Gastrointestinal: Positive for resolved nausea and vomiting.  Genitourinary: Negative for dysuria.  Musculoskeletal: Negative for back pain. Skin: Negative for rash. Neurological: Positive for mild headache.   ____________________________________________   PHYSICAL EXAM:  VITAL SIGNS: ED Triage Vitals [04/04/17 1957]  Enc Vitals Group     BP 117/74     Pulse Rate 61     Resp 14     Temp 98.2 F (36.8  C)     Temp Source Oral     SpO2 97 %     Weight 160 lb (72.6 kg)     Height 5\' 8"  (1.727 m)     Head Circumference      Peak Flow      Pain  Score 0     Pain Loc      Pain Edu?      Excl. in [place]?     Constitutional: Alert and oriented. Well appearing and in no acute distress. Eyes: Conjunctivae are normal.  EOMI.  PR early. Head: Atraumatic. Nose: No congestion/rhinnorhea. Mouth/Throat: Mucous membranes are moist.   Neck: Normal range of motion.  Cardiovascular: Normal rate, regular rhythm. Grossly normal heart sounds.  Good peripheral circulation. Respiratory: Normal respiratory effort.  No retractions. Lungs CTAB. Gastrointestinal: Soft and nontender. No distention.  Genitourinary: No flank tenderness. Musculoskeletal: No lower extremity edema.  Extremities warm and well perfused.  Neurologic:  Normal speech and language.  Motor intact in all extremities.  Normal coordination.  No gross focal neurologic deficits are appreciated.  Skin:  Skin is warm and dry. No rash noted. Psychiatric: Mood and affect are normal. Speech and behavior are normal.  ____________________________________________   LABS (all labs ordered are listed, but only abnormal results are displayed)  Labs Reviewed  BASIC METABOLIC PANEL - Abnormal; Notable for the following components:      Result Value   Glucose, Bld 193 (*)    BUN 46 (*)    Creatinine, Ser 5.94 (*)    GFR calc non Af Amer 9 (*)    GFR calc Af Amer 11 (*)    All other components within normal limits  CBC - Abnormal; Notable for the following components:   RBC 4.02 (*)    Hemoglobin 12.6 (*)    HCT 38.4 (*)    RDW 17.2 (*)    All other components within normal limits  TROPONIN I - Abnormal; Notable for the following components:   Troponin I 0.14 (*)    All other components within normal limits  CBG MONITORING, ED   ____________________________________________  EKG  ED ECG REPORT I, Arta Silence, the attending  physician, personally viewed and interpreted this ECG.  Date: 04/04/2017 EKG Time: 1953 Rate: 62 Rhythm: normal sinus rhythm QRS Axis: normal Intervals: normal ST/T Wave abnormalities: LVH with secondary repolarization abnormality and lateral T wave inversions Narrative Interpretation: no evidence of acute ischemia; no significant change when compared to EKG of 03/16/2017  ____________________________________________  RADIOLOGY    ____________________________________________   PROCEDURES  Procedure(s) performed: No  Procedures  Critical Care performed: No ____________________________________________   INITIAL IMPRESSION / ASSESSMENT AND PLAN / ED COURSE  Pertinent labs & imaging results that were available during my care of the patient were reviewed by me and considered in my medical decision making (see chart for details).  62 year old male with past medical history as noted above including ESRD on dialysis (with most recent dialysis completed this morning) presents with an episode of apparent near syncope, starting after he ate and began to have nausea and vomiting, and associated with a bowel movement noted by EMS.  Patient now is returned to his baseline.  He denies any significant symptoms at this time.  I reviewed the past medical records in epic; patient was most recently seen in the ED by me on 03/13/2017 and admitted for chest pain after having dialysis, with negative workup.  He was also admitted in December of last year for acute pulmonary edema.  On exam today, the patient is extremely well-appearing, the vital signs are normal, and the remainder the exam is unremarkable.  Lungs are clear, neuro exam is nonfocal, the patient appears well-hydrated.  Differential includes vasovagal episode, possibly some element of GERD or gastritis causing vomiting  that then precipitated the syncope, or perhaps mild hypovolemia after dialysis which was then exacerbated when the  patient started to have GI distress.  At this time there is no evidence of persistence GI issue such is colitis, diverticulitis, or hepatobiliary cause.  The patient appears well-hydrated and vital signs are stable, so is no indication for IV hydration at this time.  Plan for lab workup and observation for 1-2 hours; if the labs are unremarkable and patient remains asymptomatic and stable likely discharge home.    ----------------------------------------- 9:27 PM on 04/04/2017 -----------------------------------------  Lab workup is unremarkable.  Patient has minimally elevated troponin although this is actually below the level it has been previously, and likely residual especially given patient's ESRD history.  Patient's vital signs remained stable, and he continues to be asymptomatic except for mild headache.  He feels well and would like to go home.  At this time based on the negative workup and overall reassuring clinical picture, there is no indication for further ED observation or workup.  Return precautions given, and the patient expresses understanding.  ____________________________________________   FINAL CLINICAL IMPRESSION(S) / ED DIAGNOSES  Final diagnoses:  Near syncope      NEW MEDICATIONS STARTED DURING THIS VISIT:  New Prescriptions   No medications on file     Note:  This document was prepared using Dragon voice recognition software and may include unintentional dictation errors.    Arta Silence, MD 04/04/17 2128

## 2017-04-04 NOTE — Telephone Encounter (Signed)
S/w patient. He verbalized understanding to come in on Wednesday, 04/06/17 at 1:15 pm for 1:30 appointment with Ignacia Bayley, NP.

## 2017-04-04 NOTE — Telephone Encounter (Signed)
S/w patient to let him know. Thomas Bayley, NP has opening at 1030 am on Wednesday. Patient cannot come at that time because he will be at dialysis. Patient could come at 1:30 or later. S/w Sabrina, scheduler, and she will see if she can rearrange schedule.

## 2017-04-04 NOTE — ED Triage Notes (Signed)
Per EMS, pt had dialysis today and reports he went to the bathroom and became weak and dizzy and nauseated with emesis. Pt denies falling or head inj. EMS states pt had near syncope episode, pt had accident in bathroom which pt states "I don't remember that." EMS states pt's BP 79/40, after 114ml NS bouls BP 118/64, HR 60 and normal CBG. Pt A&O at this time, able to answer questions.

## 2017-04-04 NOTE — ED Notes (Signed)

## 2017-04-04 NOTE — Discharge Instructions (Signed)
Return to ER for new, worsening, or persistent dizziness, weakness, vomiting, feeling like you are about to pass out or passing out, or any other new or worsening symptoms that concern you.

## 2017-04-06 ENCOUNTER — Ambulatory Visit (INDEPENDENT_AMBULATORY_CARE_PROVIDER_SITE_OTHER): Payer: Medicare Other | Admitting: Nurse Practitioner

## 2017-04-06 ENCOUNTER — Encounter: Payer: Self-pay | Admitting: Nurse Practitioner

## 2017-04-06 VITALS — BP 118/62 | HR 68 | Ht 68.0 in | Wt 165.5 lb

## 2017-04-06 DIAGNOSIS — I2510 Atherosclerotic heart disease of native coronary artery without angina pectoris: Secondary | ICD-10-CM | POA: Diagnosis not present

## 2017-04-06 DIAGNOSIS — I255 Ischemic cardiomyopathy: Secondary | ICD-10-CM | POA: Diagnosis not present

## 2017-04-06 DIAGNOSIS — Z01810 Encounter for preprocedural cardiovascular examination: Secondary | ICD-10-CM

## 2017-04-06 DIAGNOSIS — I739 Peripheral vascular disease, unspecified: Secondary | ICD-10-CM | POA: Diagnosis not present

## 2017-04-06 DIAGNOSIS — I5042 Chronic combined systolic (congestive) and diastolic (congestive) heart failure: Secondary | ICD-10-CM | POA: Diagnosis not present

## 2017-04-06 DIAGNOSIS — N186 End stage renal disease: Secondary | ICD-10-CM | POA: Diagnosis not present

## 2017-04-06 DIAGNOSIS — I110 Hypertensive heart disease with heart failure: Secondary | ICD-10-CM

## 2017-04-06 DIAGNOSIS — R55 Syncope and collapse: Secondary | ICD-10-CM

## 2017-04-06 DIAGNOSIS — I493 Ventricular premature depolarization: Secondary | ICD-10-CM | POA: Diagnosis not present

## 2017-04-06 MED ORDER — AMIODARONE HCL 200 MG PO TABS: 200.0000 mg | ORAL_TABLET | Freq: Every day | ORAL | 6 refills | Status: DC

## 2017-04-06 NOTE — Progress Notes (Signed)
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  Past Medical History    Past Medical History:  Diagnosis Date  . Arthritis   . CAD (coronary artery disease)    a. 09/2013 Myoview [facility]): basal inf defect more pronounced @ rest, likely artifact, EF 48%, low risk study; b. 06/2016 Cath [facility]): LAD 10p, 30-61m, 40-50d, D1 90(small), D2 40, D3 60-70, D4 30, LCX 10p, 58m, 20d, Om1 30, OM3 40, RCA 100p w/ bridging collats, m/d RCA fill via collats, small/mod caliber-->Med Rx; c. 02/2017 MV: inf infarct w/ peri-inf ischemia. EF <30%.   . Chronic combined systolic and diastolic CHF (congestive heart failure) ([place])    a. 10/2013 Echo [facility]): EF 50%, diast dysfxn;  b. 10/2014 Echo: EF 30-35%, diff HK, gr1 DD;  c. 04/2016 Echo: EF 20-25%, sev dil LV, diff HK, gr3 DD; d. 06/2016 Echo: EF 30-35%, Gr2 DD.  . Diabetes ([place])   . Dyspnea   . ESRD (end stage renal disease) ([place])    a. MWF dialysis @ [facility].  . Hepatitis    Patient is unsure type of Hepatitis he has  . Hypertension   . Hypertensive heart disease with CHF (congestive heart failure) ([place])   . Mixed Ischemic and non-ischemic cardiomyopathy    a. 10/2013 Echo [facility]): EF 50%, diast dysfxn;  b. 10/2014 Echo: EF 30-35%, diff HK, gr1 DD;  c. 04/2016 Echo: EF 20-25%, sev dil LV, diff HK, gr3 DD; d. 06/2016 Echo: EF 30-35%, Gr2 DD.  [name] Moderate Aortic insufficiency    a. 04/2016 Echo:  Mod AI.  [name] Myocardial infarction ([place])   . Peripheral vascular disease ([place])   . Pulmonary hypertension ([place])    a. 10/2014 Echo: Mod-Sev PAH.  [name] PVC's (premature ventricular contractions)    a. 02/2017 24 hr holter: freq polymorphic PVCs w/ total of 38K beats in 24 hrs (34% burden).   Past Surgical History:  Procedure Laterality Date  . A/V FISTULAGRAM Left 04/06/2016   Procedure: A/V Fistulagram;  Surgeon: Katha Cabal, MD;  Location: [place] [facility];  Service: Cardiovascular;  Laterality: Left;  . A/V FISTULAGRAM Left 01/19/2017   Procedure: A/V FISTULAGRAM;  Surgeon: Katha Cabal, MD;  Location: [place] [facility];  Service: Cardiovascular;  Laterality: Left;  . A/V SHUNT INTERVENTION N/A 04/06/2016   Procedure: A/V Shunt Intervention;  Surgeon: Katha Cabal, MD;  Location: [place] [facility];  Service: Cardiovascular;  Laterality: N/A;  . A/V SHUNT INTERVENTION N/A 06/29/2016   Procedure: A/V Shunt Intervention;  Surgeon: Katha Cabal, MD;  Location: [place] [facility];  Service: Cardiovascular;  Laterality: N/A;  . AV FISTULA PLACEMENT Left 2014  . DIALYSIS/PERMA CATHETER INSERTION Right    Dr. Delana Meyer  . PERIPHERAL VASCULAR CATHETERIZATION N/A 10/03/2014   Procedure: A/V Shuntogram/Fistulagram;  Surgeon: Algernon Huxley, MD;  Location: [place] [facility];  Service: Cardiovascular;  Laterality: N/A;  . PERIPHERAL VASCULAR CATHETERIZATION Left 10/03/2014   Procedure: A/V Shunt Intervention;  Surgeon: Algernon Huxley,  MD;  Location: [place] [facility];  Service: Cardiovascular;  Laterality: Left;  . PERIPHERAL VASCULAR CATHETERIZATION Left 05/01/2015   Procedure: A/V Shuntogram/Fistulagram;  Surgeon: Algernon Huxley, MD;  Location: [place] [facility];  Service: Cardiovascular;  Laterality: Left;  . PERIPHERAL VASCULAR CATHETERIZATION N/A 05/01/2015   Procedure: A/V Shunt Intervention;  Surgeon: Algernon Huxley, MD;  Location: [place] [facility];  Service: Cardiovascular;   Laterality: N/A;  . UPPER EXTREMITY ANGIOGRAPHY Bilateral 06/29/2016   Procedure: Upper Extremity Angiography;  Surgeon: Katha Cabal, MD;  Location: [place] [facility];  Service: Cardiovascular;  Laterality: Bilateral;  . UPPER EXTREMITY INTERVENTION  06/29/2016   Procedure: Upper Extremity Intervention;  Surgeon: Katha Cabal, MD;  Location: [place] [facility];  Service: Cardiovascular;;  . UPPER EXTREMITY VENOGRAPHY Left 03/15/2017   Procedure: UPPER EXTREMITY VENOGRAPHY;  Surgeon: Katha Cabal, MD;  Location: [place] [facility];  Service: Cardiovascular;  Laterality: Left;    Allergies  Allergies  Allergen Reactions  . Sulfa Antibiotics Rash    History of Present Illness    62 year old male with the above complex past medical history including mixed ischemic and nonischemic cardiomyopathy, chronic combined congestive heart failure, coronary artery disease, hypertension, hyperlipidemia, pulmonary hypertension, peripheral arterial disease, and end-stage renal disease on Monday, Wednesday, Friday dialysis.  Cardiac history dates back to 2015 when he underwent stress testing in the setting of renal transplant evaluation.  Stress testing was low risk at the time though basal inferior defect was noted.  An echocardiogram in 2016 showed LV dysfunction with an EF of 30-35%.  Echo in March 2018 showed an EF of 20-25%.  In May 2018, he was admitted to [facility] where echo showed persistent LV dysfunction and catheterization was undertaken and revealed an occluded right coronary artery with left-to-right collaterals.  He otherwise had nonobstructive disease.  In December 2018, he was noted to have frequent PVCs during hospitalization and was placed on Holter monitor which showed 34,000 PVCs over 24-hour period.  He was readmitted in late January in the setting of respiratory failure and pulmonary edema and was found to have a mild troponin elevation.  He also continued to have frequent PVCs  and was placed on amiodarone therapy.  I saw him in clinic on January 23.  At that time, he was hypertensive and fatigue, after having just had dialysis.  I obtained a [employer], which was recommended at the time of his recent hospitalization given troponin elevation.  This returned high risk, which was not surprising in the setting of prior known occlusion of the right coronary artery and LV dysfunction.  Mr. Mareno is tentatively scheduled for left AV dialysis graft on March 6.  He requires preoperative clearance.  Since his last visit, he has been stable.  He has some degree of mild, chronic dyspnea on exertion which if anything has improved.  He is not particularly active.  He does not experience chest pain and is able to tolerate dialysis well without symptoms.  He reports that he has been taking Entresto since his last visit but he had not stopped losartan.  He was just seen in the emergency department 2 days ago in the setting of lightheadedness and presyncope and possibly syncope that occurred while eating after dialysis.  He had mild troponin elevation in the emergency department though this was consistent with other elevations in the past.  He was not having any chest pain that day.  ECG was nonacute and  he was discharged home as he was feeling better once he got to the emergency department.  He has not had any recurrence of presyncope or syncope.  He has already expressed that he is not interested in wearing an event monitor.  He denies palpitations, PND, orthopnea, edema, or early satiety.  Home Medications    Prior to Admission medications   Medication Sig Start Date End Date Taking? Authorizing Provider  acetaminophen (TYLENOL) 325 MG tablet Take 2 tablets (650 mg total) by mouth every 6 (six) hours as needed for mild pain (or Fever >/= 101). Patient taking differently: Take 500 mg by mouth every 6 (six) hours as needed for mild pain (or Fever >/= 101).  09/21/16  Yes Gouru, Aruna, MD    albuterol (PROVENTIL HFA;VENTOLIN HFA) 108 (90 Base) MCG/ACT inhaler Inhale 2 puffs into the lungs every 4 (four) hours as needed for wheezing or shortness of breath.    Yes [provider]  amiodarone (PACERONE) 200 MG tablet Take 1 tablet (200 mg total) by mouth daily. 04/06/17  Yes Theora Gianotti, NP  aspirin 81 MG chewable tablet Chew 81 mg by mouth. 07/21/16  Yes [provider]  atorvastatin (LIPITOR) 40 MG tablet Take 1 tablet (40 mg total) by mouth daily. 02/19/17  Yes Demetrios Loll, MD  calcium acetate (PHOSLO) 667 MG capsule Take 1,334 mg by mouth 3 (three) times daily with meals. Take 2001 mgs with meals 3 times daily and 1334 with snacks 12/11/15  Yes [provider]  CALCIUM CARBONATE PO Take 1,000 mg by mouth 2 (two) times daily with a meal.   Yes [provider]  carvedilol (COREG) 6.25 MG tablet Take 1 tablet (6.25 mg total) by mouth 2 (two) times daily with a meal. 09/21/16  Yes Gouru, Aruna, MD  cinacalcet (SENSIPAR) 90 MG tablet Take 90 mg by mouth daily.   Yes [provider]  furosemide (LASIX) 40 MG tablet Take 1 tablet (40 mg total) by mouth 2 (two) times daily. 02/18/17  Yes Demetrios Loll, MD  hydrALAZINE (APRESOLINE) 50 MG tablet Take 100 mg by mouth 3 (three) times daily.  07/01/16  Yes [provider]  ipratropium (ATROVENT HFA) 17 MCG/ACT inhaler Inhale 2 puffs into the lungs every 4 (four) hours as needed for wheezing.   Yes [provider]  isosorbide mononitrate (IMDUR) 30 MG 24 hr tablet Take 1 tablet (30 mg total) by mouth daily. Patient taking differently: Take 30 mg by mouth 2 (two) times daily.  08/24/16 02/17/18 Yes Dustin Flock, MD  lidocaine-prilocaine (EMLA) cream Apply 1 application topically as needed (dialysis access).   Yes [provider]  multivitamin (RENA-VIT) TABS tablet Take 1 tablet by mouth daily.   Yes [provider]  nitroGLYCERIN (NITROSTAT) 0.4 MG SL tablet  Place 0.4 mg under the tongue every 5 (five) minutes as needed for chest pain.   Yes [provider]  sacubitril-valsartan (ENTRESTO) 24-26 MG Take 1 tablet by mouth 2 (two) times daily. 03/16/17  Yes Theora Gianotti, NP    Review of Systems    He has chronic, mild dyspnea on exertion.  He is relatively sedentary.  He does walk some and rides his moped to get around.  He denies chest pain, palpitations, PND, orthopnea, edema, or early satiety.  He did recently have an episode of lightheadedness associated with nausea and vomiting that required ER evaluation.  ER evaluation was relatively unrevealing though he did have mild troponin elevation at  0.14.  All other systems reviewed and are otherwise negative except as noted above.  Physical Exam    VS:  BP 118/62 (BP Location: Right Arm, Patient Position: Sitting, Cuff Size: Normal)   Pulse 68   Ht 5\' 8"  (1.727 m)   Wt 165 lb 8 oz (75.1 kg)   BMI 25.16 kg/m  , BMI Body mass index is 25.16 kg/m. GEN: Well nourished, well developed, in no acute distress.  HEENT: normal.  Neck: Supple, no JVD, carotid bruits, or masses. Cardiac: RRR, 2/6 systolic murmur at the upper sternal borders, no rubs, or gallops. No clubbing, cyanosis, edema.  Radials/DP/PT 2+ and equal bilaterally.  Left upper extremity dialysis graft without bruit or thrill. Respiratory:  Respirations regular and unlabored, clear to auscultation bilaterally. GI: Soft, nontender, nondistended, BS + x 4. MS: no deformity or atrophy. Skin: warm and dry, no rash. Neuro:  Strength and sensation are intact. Psych: Normal affect.  Accessory Clinical Findings    ECG -regular sinus rhythm, 68, inferior infarct.  LVH with repolarization abnormalities.  No acute changes.  Assessment & Plan    1.  Chronic combined systolic and diastolic congestive heart failure/mixed ischemic and nonischemic cardiomyopathy: EF 30-35%.  He is euvolemic today and volume has been well managed  on dialysis.  Since his last visit, he was able to get his Entresto.  Unfortunately, he did not stop his losartan after starting Entresto and appears to have had some issues with relative hypotension and at least one episode of presyncope and possibly syncope.  I have asked him to stop losartan.  Continue carvedilol, hydralazine, nitrate, and Entresto.  No role for mineralocorticoid receptor antagonist in the setting of end-stage renal disease.  2.  Coronary artery disease/preoperative cardiovascular evaluation: Status post catheterization in May 2018 revealing a total occlusion of the right coronary artery with moderate, nonobstructive disease in the LAD.  During recent admission, he had troponin elevation and this was followed by stress testing.  He had a high risk stress test in the setting of known prior inferior infarct and also LV dysfunction.  There was question of mild peri-infarct ischemia.  Patient has not been having any chest pain and his chronic, stable, mild dyspnea on exertion.  Activity is fairly limited.  In the setting of his prior cardiac history, he has an 11% risk of a major cardiac event with noncardiac surgery.  Additional testing will not change that risk.  We do recommend continuation of aspirin, statin, and beta-blocker therapy throughout the perioperative period.  We will be available if patient requires admission.  3.  Frequent PVCs: Quiescent on amiodarone.  He has been taking this as prescribed.  He had normal TSH in December and normal LFTs in January.  These will require follow-up in the future.  We will also need to arrange for pulmonary function testing in the future.  He has follow-up with electrophysiology in early March.  4.  Hypertensive heart disease: Blood pressure improved since initiating Entresto for heart failure.  He is still on losartan and it appears that he has had some low blood pressures and also presyncope.  I am discontinuing losartan as he should not be on  both losartan and Entresto.  5.  Hyperlipidemia: He remains on statin therapy.  Next  6.  End-stage renal disease: He continues on Monday, Wednesday, and Friday dialysis.  He tolerates this well.  He is pending left upper extremity AV graft as outlined above.  7.  Peripheral arterial  disease: Status post left subclavian/innominate stenting in late January.  He is followed by vascular surgery and remains on aspirin and statin.  8.  Presyncope and syncope: Recently seen in the emergency department in the setting of presyncope with nausea and vomiting.  He is not sure if he lost consciousness.  He was hemodynamically stable in the emergency department and was feeling better.  He was discharged home from the ER.  He did take all of his medications that day and also had dialysis.  I suspect he is overmedicated in the setting of taking both losartan and Entresto.  Discontinuing losartan as above.  With his prior history of cardiomyopathy and PVCs, I did offer him a 30-day event monitor but he declined.  9.  Disposition: Patient may proceed with vascular graft surgery as planned with understanding that he does carry elevated cardiac risk in the setting of prior history of CAD, inferior infarct, and congestive heart failure.  He has follow-up with electrophysiology in general cardiology in March.   Murray Hodgkins, NP 04/06/2017, 2:58 PM

## 2017-04-06 NOTE — Patient Instructions (Signed)
Medication Instructions: - Your physician has recommended you make the following change in your medication:  1) STOP losartan  Labwork: - none ordered  Procedures/Testing: - none ordered  Follow-Up: - Your physician recommends that you schedule a follow-up appointment in: 1 month with Dr. Saunders Revel.   Any Additional Special Instructions Will Be Listed Below (If Applicable).     If you need a refill on your cardiac medications before your next appointment, please call your pharmacy.

## 2017-04-11 ENCOUNTER — Other Ambulatory Visit (INDEPENDENT_AMBULATORY_CARE_PROVIDER_SITE_OTHER): Payer: Self-pay | Admitting: Vascular Surgery

## 2017-04-20 ENCOUNTER — Ambulatory Visit: Payer: Medicare Other | Admitting: Internal Medicine

## 2017-04-26 MED ORDER — CEFAZOLIN SODIUM-DEXTROSE 2-4 GM/100ML-% IV SOLN: 2.0000 g | INTRAVENOUS | Status: DC

## 2017-04-27 ENCOUNTER — Ambulatory Visit: Payer: Medicare Other | Admitting: Certified Registered Nurse Anesthetist

## 2017-04-27 ENCOUNTER — Encounter
Admission: RE | Disposition: A | Discharge status: Home / Self Care | Payer: Self-pay | Source: Ambulatory Visit | Attending: Vascular Surgery

## 2017-04-27 ENCOUNTER — Encounter: Payer: Self-pay | Admitting: *Deleted

## 2017-04-27 ENCOUNTER — Encounter (INDEPENDENT_AMBULATORY_CARE_PROVIDER_SITE_OTHER): Payer: Self-pay

## 2017-04-27 ENCOUNTER — Ambulatory Visit
Admission: RE | Admit: 2017-04-27 | Discharge: 2017-04-27 | Disposition: A | Discharge status: Home / Self Care | Payer: Medicare Other | Source: Ambulatory Visit | Attending: Vascular Surgery | Admitting: Vascular Surgery

## 2017-04-27 DIAGNOSIS — I252 Old myocardial infarction: Secondary | ICD-10-CM | POA: Diagnosis not present

## 2017-04-27 DIAGNOSIS — Z833 Family history of diabetes mellitus: Secondary | ICD-10-CM | POA: Diagnosis not present

## 2017-04-27 DIAGNOSIS — F1921 Other psychoactive substance dependence, in remission: Secondary | ICD-10-CM | POA: Insufficient documentation

## 2017-04-27 DIAGNOSIS — N186 End stage renal disease: Secondary | ICD-10-CM | POA: Insufficient documentation

## 2017-04-27 DIAGNOSIS — I351 Nonrheumatic aortic (valve) insufficiency: Secondary | ICD-10-CM | POA: Diagnosis not present

## 2017-04-27 DIAGNOSIS — Z992 Dependence on renal dialysis: Secondary | ICD-10-CM | POA: Diagnosis not present

## 2017-04-27 DIAGNOSIS — T82858A Stenosis of vascular prosthetic devices, implants and grafts, initial encounter: Secondary | ICD-10-CM | POA: Diagnosis not present

## 2017-04-27 DIAGNOSIS — Z87891 Personal history of nicotine dependence: Secondary | ICD-10-CM | POA: Insufficient documentation

## 2017-04-27 DIAGNOSIS — I255 Ischemic cardiomyopathy: Secondary | ICD-10-CM | POA: Diagnosis not present

## 2017-04-27 DIAGNOSIS — I5042 Chronic combined systolic (congestive) and diastolic (congestive) heart failure: Secondary | ICD-10-CM | POA: Diagnosis not present

## 2017-04-27 DIAGNOSIS — E1151 Type 2 diabetes mellitus with diabetic peripheral angiopathy without gangrene: Secondary | ICD-10-CM | POA: Diagnosis not present

## 2017-04-27 DIAGNOSIS — I132 Hypertensive heart and chronic kidney disease with heart failure and with stage 5 chronic kidney disease, or end stage renal disease: Secondary | ICD-10-CM | POA: Insufficient documentation

## 2017-04-27 DIAGNOSIS — Z882 Allergy status to sulfonamides status: Secondary | ICD-10-CM | POA: Diagnosis not present

## 2017-04-27 DIAGNOSIS — Z8249 Family history of ischemic heart disease and other diseases of the circulatory system: Secondary | ICD-10-CM | POA: Diagnosis not present

## 2017-04-27 DIAGNOSIS — T82868A Thrombosis of vascular prosthetic devices, implants and grafts, initial encounter: Secondary | ICD-10-CM

## 2017-04-27 DIAGNOSIS — I2510 Atherosclerotic heart disease of native coronary artery without angina pectoris: Secondary | ICD-10-CM | POA: Diagnosis not present

## 2017-04-27 DIAGNOSIS — I10 Essential (primary) hypertension: Secondary | ICD-10-CM | POA: Diagnosis not present

## 2017-04-27 DIAGNOSIS — X58XXXA Exposure to other specified factors, initial encounter: Secondary | ICD-10-CM | POA: Insufficient documentation

## 2017-04-27 DIAGNOSIS — E1122 Type 2 diabetes mellitus with diabetic chronic kidney disease: Secondary | ICD-10-CM | POA: Diagnosis not present

## 2017-04-27 DIAGNOSIS — Z5309 Procedure and treatment not carried out because of other contraindication: Secondary | ICD-10-CM | POA: Diagnosis not present

## 2017-04-27 DIAGNOSIS — M1990 Unspecified osteoarthritis, unspecified site: Secondary | ICD-10-CM | POA: Diagnosis not present

## 2017-04-27 DIAGNOSIS — E119 Type 2 diabetes mellitus without complications: Secondary | ICD-10-CM | POA: Diagnosis not present

## 2017-04-27 LAB — URINE DRUG SCREEN, QUALITATIVE (ARMC ONLY)
AMPHETAMINES, UR SCREEN: NOT DETECTED
Barbiturates, Ur Screen: NOT DETECTED
Benzodiazepine, Ur Scrn: NOT DETECTED
COCAINE METABOLITE, UR ~~LOC~~: POSITIVE — AB
Cannabinoid 50 Ng, Ur ~~LOC~~: NOT DETECTED
MDMA (Ecstasy)Ur Screen: NOT DETECTED
METHADONE SCREEN, URINE: NOT DETECTED
OPIATE, UR SCREEN: NOT DETECTED
PHENCYCLIDINE (PCP) UR S: NOT DETECTED
Tricyclic, Ur Screen: NOT DETECTED

## 2017-04-27 LAB — TYPE AND SCREEN
ABO/RH(D): O POS
ANTIBODY SCREEN: NEGATIVE

## 2017-04-27 LAB — POCT I-STAT 4, (NA,K, GLUC, HGB,HCT)
GLUCOSE: 106 mg/dL — AB (ref 65–99)
HEMATOCRIT: 43 % (ref 39.0–52.0)
HEMOGLOBIN: 14.6 g/dL (ref 13.0–17.0)
Potassium: 4.4 mmol/L (ref 3.5–5.1)
SODIUM: 141 mmol/L (ref 135–145)

## 2017-04-27 LAB — GLUCOSE, CAPILLARY: GLUCOSE-CAPILLARY: 108 mg/dL — AB (ref 65–99)

## 2017-04-27 SURGERY — INSERTION OF ARTERIOVENOUS (AV) GORE-TEX GRAFT ARM
Anesthesia: General | Laterality: Left

## 2017-04-27 MED ORDER — CARVEDILOL 12.5 MG PO TABS
6.2500 mg | ORAL_TABLET | Freq: Once | ORAL | Status: AC
  Administered 2017-04-27: 6.25 mg via ORAL

## 2017-04-27 MED ORDER — FENTANYL CITRATE (PF) 100 MCG/2ML IJ SOLN
INTRAMUSCULAR | Status: AC
  Filled 2017-04-27: qty 2

## 2017-04-27 MED ORDER — LIDOCAINE HCL (PF) 2 % IJ SOLN
INTRAMUSCULAR | Status: AC
Start: 2017-04-27 — End: 2017-04-27
  Filled 2017-04-27: qty 10

## 2017-04-27 MED ORDER — FAMOTIDINE 20 MG PO TABS
20.0000 mg | ORAL_TABLET | Freq: Once | ORAL | Status: AC
  Administered 2017-04-27: 20 mg via ORAL

## 2017-04-27 MED ORDER — PROPOFOL 500 MG/50ML IV EMUL
INTRAVENOUS | Status: AC
  Filled 2017-04-27: qty 50

## 2017-04-27 MED ORDER — CEFAZOLIN SODIUM-DEXTROSE 2-4 GM/100ML-% IV SOLN
INTRAVENOUS | Status: AC
  Filled 2017-04-27: qty 100

## 2017-04-27 MED ORDER — MIDAZOLAM HCL 2 MG/2ML IJ SOLN
INTRAMUSCULAR | Status: AC
  Filled 2017-04-27: qty 2

## 2017-04-27 MED ORDER — BUPIVACAINE HCL (PF) 0.5 % IJ SOLN
INTRAMUSCULAR | Status: AC
  Filled 2017-04-27: qty 30

## 2017-04-27 MED ORDER — CHLORHEXIDINE GLUCONATE CLOTH 2 % EX PADS: 6.0000 | MEDICATED_PAD | Freq: Once | CUTANEOUS | Status: DC

## 2017-04-27 MED ORDER — SODIUM CHLORIDE 0.9 % IV SOLN: INTRAVENOUS | Status: DC

## 2017-04-27 MED ORDER — FAMOTIDINE 20 MG PO TABS
ORAL_TABLET | ORAL | Status: AC
  Administered 2017-04-27: 20 mg via ORAL
  Filled 2017-04-27: qty 1

## 2017-04-27 MED ORDER — CARVEDILOL 12.5 MG PO TABS
ORAL_TABLET | ORAL | Status: AC
  Filled 2017-04-27: qty 1

## 2017-04-27 MED ORDER — HEPARIN SODIUM (PORCINE) 5000 UNIT/ML IJ SOLN
INTRAMUSCULAR | Status: AC
  Filled 2017-04-27: qty 1

## 2017-04-27 MED ORDER — CARVEDILOL 12.5 MG PO TABS
ORAL_TABLET | ORAL | Status: AC
  Administered 2017-04-27: 6.25 mg via ORAL
  Filled 2017-04-27: qty 1

## 2017-04-27 MED ORDER — PAPAVERINE HCL 30 MG/ML IJ SOLN
INTRAMUSCULAR | Status: AC
  Filled 2017-04-27: qty 2

## 2017-04-27 SURGICAL SUPPLY — 57 items
APPLIER CLIP 11 MED OPEN (CLIP)
APPLIER CLIP 9.375 SM OPEN (CLIP)
BAG COUNTER SPONGE EZ (MISCELLANEOUS) ×2 IMPLANT
BAG DECANTER FOR FLEXI CONT (MISCELLANEOUS) ×3 IMPLANT
BLADE SURG SZ11 CARB STEEL (BLADE) ×3 IMPLANT
BOOT SUTURE AID YELLOW STND (SUTURE) ×3 IMPLANT
BRUSH SCRUB EZ  4% CHG (MISCELLANEOUS) ×2
BRUSH SCRUB EZ 4% CHG (MISCELLANEOUS) ×1 IMPLANT
CANISTER SUCT 1200ML W/VALVE (MISCELLANEOUS) ×3 IMPLANT
CHLORAPREP W/TINT 26ML (MISCELLANEOUS) ×3 IMPLANT
CLIP APPLIE 11 MED OPEN (CLIP) IMPLANT
CLIP APPLIE 9.375 SM OPEN (CLIP) IMPLANT
COUNTER SPONGE BAG EZ (MISCELLANEOUS) ×1
DERMABOND ADVANCED (GAUZE/BANDAGES/DRESSINGS) ×2
DERMABOND ADVANCED .7 DNX12 (GAUZE/BANDAGES/DRESSINGS) ×1 IMPLANT
DRESSING SURGICEL FIBRLLR 1X2 (HEMOSTASIS) ×1 IMPLANT
DRSG SURGICEL FIBRILLAR 1X2 (HEMOSTASIS) ×3
ELECT CAUTERY BLADE 6.4 (BLADE) ×3 IMPLANT
ELECT REM PT RETURN 9FT ADLT (ELECTROSURGICAL) ×3
ELECTRODE REM PT RTRN 9FT ADLT (ELECTROSURGICAL) ×1 IMPLANT
GLOVE BIO SURGEON STRL SZ7 (GLOVE) ×3 IMPLANT
GLOVE INDICATOR 7.5 STRL GRN (GLOVE) ×3 IMPLANT
GLOVE SURG SYN 8.0 (GLOVE) ×3 IMPLANT
GOWN STRL REUS W/ TWL LRG LVL3 (GOWN DISPOSABLE) ×2 IMPLANT
GOWN STRL REUS W/ TWL XL LVL3 (GOWN DISPOSABLE) ×1 IMPLANT
GOWN STRL REUS W/TWL LRG LVL3 (GOWN DISPOSABLE) ×4
GOWN STRL REUS W/TWL XL LVL3 (GOWN DISPOSABLE) ×2
IV NS 500ML (IV SOLUTION) ×2
IV NS 500ML BAXH (IV SOLUTION) ×1 IMPLANT
KIT TURNOVER KIT A (KITS) ×3 IMPLANT
LABEL OR SOLS (LABEL) ×3 IMPLANT
LOOP RED MAXI  1X406MM (MISCELLANEOUS) ×2
LOOP VESSEL MAXI 1X406 RED (MISCELLANEOUS) ×1 IMPLANT
LOOP VESSEL MINI 0.8X406 BLUE (MISCELLANEOUS) ×2 IMPLANT
LOOPS BLUE MINI 0.8X406MM (MISCELLANEOUS) ×4
NEEDLE FILTER BLUNT 18X 1/2SAF (NEEDLE) ×2
NEEDLE FILTER BLUNT 18X1 1/2 (NEEDLE) ×1 IMPLANT
NS IRRIG 500ML POUR BTL (IV SOLUTION) ×3 IMPLANT
PACK EXTREMITY ARMC (MISCELLANEOUS) ×3 IMPLANT
PAD PREP 24X41 OB/GYN DISP (PERSONAL CARE ITEMS) ×3 IMPLANT
PUNCH SURGICAL ROTATE 2.7MM (MISCELLANEOUS) IMPLANT
STOCKINETTE STRL 4IN 9604848 (GAUZE/BANDAGES/DRESSINGS) ×3 IMPLANT
SUT GTX CV-6 30 (SUTURE) ×6 IMPLANT
SUT MNCRL+ 5-0 UNDYED PC-3 (SUTURE) ×1 IMPLANT
SUT MONOCRYL 5-0 (SUTURE) ×2
SUT PROLENE 6 0 BV (SUTURE) ×6 IMPLANT
SUT SILK 2 0 (SUTURE) ×2
SUT SILK 2 0 SH (SUTURE) ×3 IMPLANT
SUT SILK 2-0 18XBRD TIE 12 (SUTURE) ×1 IMPLANT
SUT SILK 3 0 (SUTURE) ×2
SUT SILK 3-0 18XBRD TIE 12 (SUTURE) ×1 IMPLANT
SUT SILK 4 0 (SUTURE) ×2
SUT SILK 4-0 18XBRD TIE 12 (SUTURE) ×1 IMPLANT
SUT VIC AB 3-0 SH 27 (SUTURE) ×4
SUT VIC AB 3-0 SH 27X BRD (SUTURE) ×2 IMPLANT
SYR 20CC LL (SYRINGE) ×3 IMPLANT
SYR 3ML LL SCALE MARK (SYRINGE) ×3 IMPLANT

## 2017-04-27 NOTE — H&P (Addendum)
[facility] Admission History & Physical  MRN : [id number]  [name] is a 62 y.o. (03/02/1955) male who presents with chief complaint of No chief complaint on file. [name]  History of Present Illness:   The patient is seen for evaluation of dialysis access.  The patient has a history of multiple failed accesses.  There have been accesses in both arms and in the thighs.    Current access is via a catheter which is functioning poorly.  There have been several episodes of catheter infection.  The patient denies amaurosis fugax or recent TIA symptoms. There are no recent neurological changes noted. The patient denies claudication symptoms or rest pain symptoms. The patient denies history of DVT, PE or superficial thrombophlebitis. The patient denies recent episodes of angina or shortness of breath.    Current Facility-Administered Medications  Medication Dose Route Frequency Provider Last Rate Last Dose  . 0.9 %  sodium chloride infusion   Intravenous Continuous Pen[place]en, Amy, MD      . ceFAZolin (ANCEF) 2-4 GM/100ML-% IVPB           . ceFAZolin (ANCEF) IVPB 2g/100 mL premix  2 g Intravenous On Call to Lugoff, PA-C      . Chlorhexidine Gluconate Cloth 2 % PADS 6 each  6 each Topical Once Stegmayer, Kimberly A, PA-C       And  . Chlorhexidine Gluconate Cloth 2 % PADS 6 each  6 each Topical Once Stegmayer, Janalyn Harder, PA-C        Past Medical History:  Diagnosis Date  . Arthritis   . CAD (coronary artery disease)    a. 09/2013 Myoview [facility]): basal inf defect more pronounced @ rest, likely artifact, EF 48%, low risk study; b. 06/2016 Cath [facility]): LAD 10p, 30-47m, 40-50d, D1 90(small), D2 40, D3 60-70, D4 30, LCX 10p, 60m, 20d, Om1 30, OM3 40, RCA 100p w/ bridging collats, m/d RCA fill via collats, small/mod caliber-->Med Rx; c. 02/2017 MV: inf infarct w/ peri-inf ischemia. EF <30%.   . Chronic combined systolic and diastolic CHF (congestive heart  failure) ([place])    a. 10/2013 Echo [facility]): EF 50%, diast dysfxn;  b. 10/2014 Echo: EF 30-35%, diff HK, gr1 DD;  c. 04/2016 Echo: EF 20-25%, sev dil LV, diff HK, gr3 DD; d. 06/2016 Echo: EF 30-35%, Gr2 DD.  . Diabetes ([place])   . Dyspnea   . ESRD (end stage renal disease) ([place])    a. MWF dialysis @ [facility].  . Hepatitis    Patient is unsure type of Hepatitis he has  . Hypertension   . Hypertensive heart disease with CHF (congestive heart failure) ([place])   . Mixed Ischemic and non-ischemic cardiomyopathy    a. 10/2013 Echo [facility]): EF 50%, diast dysfxn;  b. 10/2014 Echo: EF 30-35%, diff HK, gr1 DD;  c. 04/2016 Echo: EF 20-25%, sev dil LV, diff HK, gr3 DD; d. 06/2016 Echo: EF 30-35%, Gr2 DD.  [name] Moderate Aortic insufficiency    a. 04/2016 Echo: Mod AI.  [name] Myocardial infarction ([place])   . Peripheral vascular disease ([place])   . Pulmonary hypertension ([place])    a. 10/2014 Echo: Mod-Sev PAH.  [name] PVC's (premature ventricular contractions)    a. 02/2017 24 hr holter: freq polymorphic PVCs w/ total of 38K beats in 24 hrs (34% burden).    Past Surgical History:  Procedure Laterality Date  . A/V FISTULAGRAM Left 04/06/2016   Procedure: A/V Fistulagram;  Surgeon:  Katha Cabal, MD;  Location: [place] [facility];  Service: Cardiovascular;  Laterality: Left;  . A/V FISTULAGRAM Left 01/19/2017   Procedure: A/V FISTULAGRAM;  Surgeon: Katha Cabal, MD;  Location: [place] [facility];  Service: Cardiovascular;  Laterality: Left;  . A/V SHUNT INTERVENTION N/A 04/06/2016   Procedure: A/V Shunt Intervention;  Surgeon: Katha Cabal, MD;  Location: [place] [facility];  Service: Cardiovascular;  Laterality: N/A;  . A/V SHUNT INTERVENTION N/A 06/29/2016   Procedure: A/V Shunt Intervention;  Surgeon: Katha Cabal, MD;  Location: [place] [facility];  Service: Cardiovascular;  Laterality: N/A;  . AV FISTULA PLACEMENT Left 2014  . DIALYSIS/PERMA CATHETER INSERTION Right    Dr. Delana Meyer  . PERIPHERAL  VASCULAR CATHETERIZATION N/A 10/03/2014   Procedure: A/V Shuntogram/Fistulagram;  Surgeon: Algernon Huxley, MD;  Location: [place] [facility];  Service: Cardiovascular;  Laterality: N/A;  . PERIPHERAL VASCULAR CATHETERIZATION Left 10/03/2014   Procedure: A/V Shunt Intervention;  Surgeon: Algernon Huxley, MD;  Location: [place] [facility];  Service: Cardiovascular;  Laterality: Left;  . PERIPHERAL VASCULAR CATHETERIZATION Left 05/01/2015   Procedure: A/V Shuntogram/Fistulagram;  Surgeon: Algernon Huxley, MD;  Location: [place] [facility];  Service: Cardiovascular;  Laterality: Left;  . PERIPHERAL VASCULAR CATHETERIZATION N/A 05/01/2015   Procedure: A/V Shunt Intervention;  Surgeon: Algernon Huxley, MD;  Location: [place] [facility];  Service: Cardiovascular;  Laterality: N/A;  . UPPER EXTREMITY ANGIOGRAPHY Bilateral 06/29/2016   Procedure: Upper Extremity Angiography;  Surgeon: Katha Cabal, MD;  Location: [place] [facility];  Service: Cardiovascular;  Laterality: Bilateral;  . UPPER EXTREMITY INTERVENTION  06/29/2016   Procedure: Upper Extremity Intervention;  Surgeon: Katha Cabal, MD;  Location: [place] [facility];  Service: Cardiovascular;;  . UPPER EXTREMITY VENOGRAPHY Left 03/15/2017   Procedure: UPPER EXTREMITY VENOGRAPHY;  Surgeon: Katha Cabal, MD;  Location: [place] [facility];  Service: Cardiovascular;  Laterality: Left;    Social History Social History   Tobacco Use  . Smoking status: Former Smoker    Packs/day: 0.25    Years: 40.00    Pack years: 10.00    Types: Cigarettes    Last attempt to quit: 10/12/2016    Years since quitting: 0.5  . Smokeless tobacco: Never Used  Substance Use Topics  . Alcohol use: No    Alcohol/week: 0.0 oz  . Drug use: Yes    Types: Cocaine, Marijuana    Comment: patient states no drugs    Family History Family History  Problem Relation Age of Onset  . Hypertension Son   . Diabetes Son   . Hypertension Mother   . Diabetes Mother    . Diabetes Sister     No family history of bleeding or clotting disorders, autoimmune disease or porphyria  Allergies  Allergen Reactions  . Sulfa Antibiotics Rash     REVIEW OF SYSTEMS (Negative unless checked)  Constitutional: [] Weight loss  [] Fever  [] Chills Cardiac: [] Chest pain   [] Chest pressure   [] Palpitations   [] Shortness of breath when laying flat   [] Shortness of breath at rest   [x] Shortness of breath with exertion. Vascular:  [] Pain in legs with walking   [] Pain in legs at rest   [] Pain in legs when laying flat   [] Claudication   [] Pain in feet when walking  [] Pain in feet at rest  [] Pain in feet when laying flat   [] History of DVT   [] Phlebitis   []   Swelling in legs   [] Varicose veins   [] Non-healing ulcers Pulmonary:   [] Uses home oxygen   [] Productive cough   [] Hemoptysis   [] Wheeze  [] COPD   [] Asthma Neurologic:  [] Dizziness  [] Blackouts   [] Seizures   [] History of stroke   [] History of TIA  [] Aphasia   [] Temporary blindness   [] Dysphagia   [] Weakness or numbness in arms   [] Weakness or numbness in legs Musculoskeletal:  [] Arthritis   [] Joint swelling   [] Joint pain   [] Low back pain Hematologic:  [] Easy bruising  [] Easy bleeding   [] Hypercoagulable state   [] Anemic  [] Hepatitis Gastrointestinal:  [] Blood in stool   [] Vomiting blood  [] Gastroesophageal reflux/heartburn   [] Difficulty swallowing. Genitourinary:  [x] Chronic kidney disease   [] Difficult urination  [] Frequent urination  [] Burning with urination   [] Blood in urine Skin:  [] Rashes   [] Ulcers   [] Wounds Psychological:  [] History of anxiety   []  History of major depression.  Physical Examination  Vitals:   04/27/17 0608  BP: 136/62  Pulse: 77  Resp: 12  Temp: (!) 97.3 F (36.3 C)  TempSrc: Tympanic  SpO2: 100%  Weight: 173 lb (78.5 kg)  Height: 5\' 8"  (1.727 m)   Body mass index is 26.3 kg/m. Gen: WD/WN, NAD Head: [place]/AT, No temporalis wasting. Prominent temp pulse not noted. Ear/Nose/Throat: Hearing  grossly intact, nares w/o erythema or drainage, oropharynx w/o Erythema/Exudate,  Eyes: Conjunctiva clear, sclera non-icteric Neck: Trachea midline.  No JVD.  Pulmonary:  Good air movement, respirations not labored, no use of accessory muscles.  Cardiac: RRR, normal S1, S2. Vascular: right IJ cath CD&I Vessel Right Left  Radial Palpable Palpable  Ulnar Not Palpable Not Palpable  Brachial Palpable Palpable  Carotid Palpable, without bruit Palpable, without bruit  Gastrointestinal: soft, non-tender/non-distended. No guarding/reflex.  Musculoskeletal: M/S 5/5 throughout.  Extremities without ischemic changes.  No deformity or atrophy.  Neurologic: Sensation grossly intact in extremities.  Symmetrical.  Speech is fluent. Motor exam as listed above. Psychiatric: Judgment intact, Mood & affect appropriate for pt's clinical situation. Dermatologic: No rashes or ulcers noted.  No cellulitis or open wounds. Lymph : No Cervical, Axillary, or Inguinal lymphadenopathy.   CBC Lab Results  Component Value Date   WBC 6.1 04/04/2017   HGB 14.6 04/27/2017   HCT 43.0 04/27/2017   MCV 95.4 04/04/2017   PLT 168 04/04/2017    BMET    Component Value Date/Time   NA 141 04/27/2017 0624   NA 139 04/30/2013 1203   K 4.4 04/27/2017 0624   K 4.4 04/30/2013 1203   CL 103 04/04/2017 2002   CL 107 04/30/2013 1203   CO2 22 04/04/2017 2002   CO2 25 04/30/2013 1203   GLUCOSE 106 (H) 04/27/2017 0624   GLUCOSE 104 (H) 04/30/2013 1203   BUN 46 (H) 04/04/2017 2002   BUN 76 (H) 04/30/2013 1203   CREATININE 5.94 (H) 04/04/2017 2002   CREATININE 9.49 (H) 04/30/2013 1203   CALCIUM 8.9 04/04/2017 2002   CALCIUM 8.0 (L) 04/30/2013 1203   GFRNONAA 9 (L) 04/04/2017 2002   GFRNONAA 5 (L) 04/30/2013 1203   GFRAA 11 (L) 04/04/2017 2002   GFRAA 6 (L) 04/30/2013 1203   CrCl cannot be calculated (Patient's most recent lab result is older than the maximum 21 days allowed.).  COAG Lab Results  Component Value  Date   INR 1.04 03/31/2017   INR 1.10 07/27/2016   INR 1.1 04/26/2013    Radiology No results found.  Assessment/Plan 1.  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 The patient will undergo placement of a Left brachial axillary AV graft.  The risks and benefits were described to the patient.  All questions were answered.  The patient agrees to proceed with surgery. Potassium will be drawn to ensure that it is an appropriate level prior to performing intervention. 2.  End-stage renal disease requiring hemodialysis:  Patient will continue dialysis therapy without further interruption if a successful intervention is not achieved then a tunneled catheter will be placed. Dialysis has already been arranged. 3.  Hypertension:  Patient will continue medical management; nephrology is following no changes in oral medications. 4. Diabetes mellitus:  Glucose will be monitored and oral medications been held this morning once the patient has undergone the patient's procedure po intake will be reinitiated and again Accu-Cheks will be used to assess the blood glucose level and treat as needed. The patient will be restarted on the patient's usual hypoglycemic regime  ADDENDUM:  Patient is + for cocaine and his surgery is canceled will reschedule for about two weeks  Hortencia Pilar, MD  04/27/2017 7:21 AM

## 2017-04-27 NOTE — Progress Notes (Signed)
Patient surgery cancelled per anesthesia, positive UDS. Patient IV d/c and patient left hospital.

## 2017-04-27 NOTE — Anesthesia Preprocedure Evaluation (Deleted)
Anesthesia Evaluation  Patient identified by MRN, date of birth, ID band Patient awake    Reviewed: Allergy & Precautions, H&P , NPO status , Patient's Chart, lab work & pertinent test results, reviewed documented beta blocker date and time   History of Anesthesia Complications Negative for: history of anesthetic complications  Airway Mallampati: III  TM Distance: >3 FB Neck ROM: full    Dental  (+) Partial Upper, Partial Lower, Dental Advidsory Given   Pulmonary shortness of breath and with exertion, neg COPD, neg recent URI, former smoker,           Cardiovascular Exercise Tolerance: Good hypertension, (-) angina+ CAD, + Past MI, + Peripheral Vascular Disease and +CHF  (-) Cardiac Stents and (-) CABG + dysrhythmias + Valvular Problems/Murmurs AI      Neuro/Psych negative neurological ROS  negative psych ROS   GI/Hepatic negative GI ROS, (+) Hepatitis - (Unsure what type)  Endo/Other  diabetes  Renal/GU Dialysis and ESRFRenal disease  negative genitourinary   Musculoskeletal   Abdominal   Peds  Hematology negative hematology ROS (+)   Anesthesia Other Findings Past Medical History: No date: Arthritis No date: CAD (coronary artery disease)     Comment:  a. 09/2013 Myoview [facility]): basal inf defect more               pronounced @ rest, likely artifact, EF 48%, low risk               study; b. 06/2016 Cath [facility]): LAD 10p, 30-24m, 40-50d, D1               90(small), D2 40, D3 60-70, D4 30, LCX 10p, 26m, 20d, Om1              30, OM3 40, RCA 100p w/ bridging collats, m/d RCA fill               via collats, small/mod caliber-->Med Rx; c. 02/2017 MV:               inf infarct w/ peri-inf ischemia. EF <30%.  No date: Chronic combined systolic and diastolic CHF (congestive  heart failure) ([place])     Comment:  a. 10/2013 Echo [facility]): EF 50%, diast dysfxn;  b. 10/2014               Echo: EF 30-35%, diff HK, gr1 DD;  c. 04/2016  Echo: EF               20-25%, sev dil LV, diff HK, gr3 DD; d. 06/2016 Echo: EF               30-35%, Gr2 DD. No date: Diabetes ([place]) No date: Dyspnea No date: ESRD (end stage renal disease) ([place])     Comment:  a. MWF dialysis @ [facility]. No date: Hepatitis     Comment:  Patient is unsure type of Hepatitis he has No date: Hypertension No date: Hypertensive heart disease with CHF (congestive heart  failure) ([place]) No date: Mixed Ischemic and non-ischemic cardiomyopathy     Comment:  a. 10/2013 Echo [facility]): EF 50%, diast dysfxn;  b. 10/2014               Echo: EF 30-35%, diff HK, gr1 DD;  c. 04/2016 Echo: EF               20-25%, sev dil LV, diff HK, gr3 DD; d. 06/2016 Echo: EF  30-35%, Gr2 DD. No date: Moderate Aortic insufficiency     Comment:  a. 04/2016 Echo: Mod AI. No date: Myocardial infarction [facility]) No date: Peripheral vascular disease ([place]) No date: Pulmonary hypertension ([place])     Comment:  a. 10/2014 Echo: Mod-Sev PAH. No date: PVC's (premature ventricular contractions)     Comment:  a. 02/2017 24 hr holter: freq polymorphic PVCs w/ total               of 38K beats in 24 hrs (34% burden).   Reproductive/Obstetrics negative OB ROS                             Anesthesia Physical Anesthesia Plan  ASA: IV  Anesthesia Plan: General   Post-op Pain Management:    Induction: Intravenous  PONV Risk Score and Plan: 2 and Ondansetron and Dexamethasone  Airway Management Planned: LMA  Additional Equipment:   Intra-op Plan:   Post-operative Plan: Extubation in OR  Informed Consent: I have reviewed the patients History and Physical, chart, labs and discussed the procedure including the risks, benefits and alternatives for the proposed anesthesia with the patient or authorized representative who has indicated his/her understanding and acceptance.   Dental Advisory Given  Plan Discussed with: Anesthesiologist, CRNA and  Surgeon  Anesthesia Plan Comments:         Anesthesia Quick Evaluation

## 2017-04-28 ENCOUNTER — Ambulatory Visit: Payer: Medicare Other | Admitting: Internal Medicine

## 2017-05-10 ENCOUNTER — Ambulatory Visit: Payer: Medicare Other | Admitting: Physician Assistant

## 2017-05-19 ENCOUNTER — Ambulatory Visit (INDEPENDENT_AMBULATORY_CARE_PROVIDER_SITE_OTHER): Payer: Medicare Other | Admitting: Internal Medicine

## 2017-05-19 ENCOUNTER — Encounter: Payer: Self-pay | Admitting: Internal Medicine

## 2017-05-19 VITALS — BP 150/64 | HR 69 | Ht 68.0 in | Wt 170.8 lb

## 2017-05-19 DIAGNOSIS — Z79899 Other long term (current) drug therapy: Secondary | ICD-10-CM

## 2017-05-19 DIAGNOSIS — I493 Ventricular premature depolarization: Secondary | ICD-10-CM | POA: Diagnosis not present

## 2017-05-19 NOTE — Progress Notes (Signed)
ELECTROPHYSIOLOGY CONSULT NOTE  Patient ID: [name], MRN: [id number], DOB/AGE: 63-09-1955 63 y.o. Admit date: (Not on file) Date of Consult: 05/19/2017  Primary Physician: [facility] [place] Primary Cardiologist:MA     [name] is a 62 y.o. male who is being seen today for the evaluation of PVC at the request of MA.    HPI [name] is a 62 y.o. male seen for frequent PVCs in the context of a progressive cardiomyopathy    DATE TEST EF   9/15 Echo   50 %   9/16 Echo   30-35 %   3/18 Echo  20=25%   5/18 LHc  T RCA w R>L collaterals  1/19 Myoview <25% Scar with mild ischemia   He has been hospitalized over the last number of months with heart failure.  Left ventricular function has been noted to be deteriorating and he underwent catheterization as noted above.  Holter monitoring 1/19 demonstrated frequent PVCs burden of about 34% Personally reviewed    12-lead ECG 1/19 demonstrated a left bundle like superior axis PVC;  he was started on amiodarone and has had virtual obliteration of his PVC burden  Date TSH LFTs PFTs  12/18  1.97 32            Patient denies symptoms of GI intolerance, sun sensitivity, neurological symptoms attributable to amiodarone.   Past Medical History:  Diagnosis Date  . Arthritis   . CAD (coronary artery disease)    a. 09/2013 Myoview [facility]): basal inf defect more pronounced @ rest, likely artifact, EF 48%, low risk study; b. 06/2016 Cath [facility]): LAD 10p, 30-54m, 40-50d, D1 90(small), D2 40, D3 60-70, D4 30, LCX 10p, 77m, 20d, Om1 30, OM3 40, RCA 100p w/ bridging collats, m/d RCA fill via collats, small/mod caliber-->Med Rx; c. 02/2017 MV: inf infarct w/ peri-inf ischemia. EF <30%.   . Chronic combined systolic and diastolic CHF (congestive heart failure) ([place])    a. 10/2013 Echo [facility]): EF 50%, diast dysfxn;  b. 10/2014 Echo: EF 30-35%, diff HK, gr1 DD;  c. 04/2016 Echo: EF 20-25%, sev dil LV, diff HK, gr3 DD; d. 06/2016  Echo: EF 30-35%, Gr2 DD.  . Diabetes ([place])   . Dyspnea   . ESRD (end stage renal disease) ([place])    a. MWF dialysis @ [facility].  . Hepatitis    Patient is unsure type of Hepatitis he has  . Hypertension   . Hypertensive heart disease with CHF (congestive heart failure) ([place])   . Mixed Ischemic and non-ischemic cardiomyopathy    a. 10/2013 Echo [facility]): EF 50%, diast dysfxn;  b. 10/2014 Echo: EF 30-35%, diff HK, gr1 DD;  c. 04/2016 Echo: EF 20-25%, sev dil LV, diff HK, gr3 DD; d. 06/2016 Echo: EF 30-35%, Gr2 DD.  [name] Moderate Aortic insufficiency    a. 04/2016 Echo: Mod AI.  [name] Myocardial infarction ([place])   . Peripheral vascular disease ([place])   . Pulmonary hypertension ([place])    a. 10/2014 Echo: Mod-Sev PAH.  [name] PVC's (premature ventricular contractions)    a. 02/2017 24 hr holter: freq polymorphic PVCs w/ total of 38K beats in 24 hrs (34% burden).      Surgical History:  Past Surgical History:  Procedure Laterality Date  . A/V FISTULAGRAM Left 04/06/2016   Procedure: A/V Fistulagram;  Surgeon: Katha Cabal, MD;  Location: [place] [facility];  Service: Cardiovascular;  Laterality: Left;  . A/V FISTULAGRAM Left 01/19/2017  Procedure: A/V FISTULAGRAM;  Surgeon: Katha Cabal, MD;  Location: [place] [facility];  Service: Cardiovascular;  Laterality: Left;  . A/V SHUNT INTERVENTION N/A 04/06/2016   Procedure: A/V Shunt Intervention;  Surgeon: Katha Cabal, MD;  Location: [place] [facility];  Service: Cardiovascular;  Laterality: N/A;  . A/V SHUNT INTERVENTION N/A 06/29/2016   Procedure: A/V Shunt Intervention;  Surgeon: Katha Cabal, MD;  Location: [place] [facility];  Service: Cardiovascular;  Laterality: N/A;  . AV FISTULA PLACEMENT Left 2014  . DIALYSIS/PERMA CATHETER INSERTION Right    Dr. Delana Meyer  . PERIPHERAL VASCULAR CATHETERIZATION N/A 10/03/2014   Procedure: A/V Shuntogram/Fistulagram;  Surgeon: Algernon Huxley, MD;  Location: [place] [facility];  Service:  Cardiovascular;  Laterality: N/A;  . PERIPHERAL VASCULAR CATHETERIZATION Left 10/03/2014   Procedure: A/V Shunt Intervention;  Surgeon: Algernon Huxley, MD;  Location: [place] [facility];  Service: Cardiovascular;  Laterality: Left;  . PERIPHERAL VASCULAR CATHETERIZATION Left 05/01/2015   Procedure: A/V Shuntogram/Fistulagram;  Surgeon: Algernon Huxley, MD;  Location: [place] [facility];  Service: Cardiovascular;  Laterality: Left;  . PERIPHERAL VASCULAR CATHETERIZATION N/A 05/01/2015   Procedure: A/V Shunt Intervention;  Surgeon: Algernon Huxley, MD;  Location: [place] [facility];  Service: Cardiovascular;  Laterality: N/A;  . UPPER EXTREMITY ANGIOGRAPHY Bilateral 06/29/2016   Procedure: Upper Extremity Angiography;  Surgeon: Katha Cabal, MD;  Location: [place] [facility];  Service: Cardiovascular;  Laterality: Bilateral;  . UPPER EXTREMITY INTERVENTION  06/29/2016   Procedure: Upper Extremity Intervention;  Surgeon: Katha Cabal, MD;  Location: [place] [facility];  Service: Cardiovascular;;  . UPPER EXTREMITY VENOGRAPHY Left 03/15/2017   Procedure: UPPER EXTREMITY VENOGRAPHY;  Surgeon: Katha Cabal, MD;  Location: [place] [facility];  Service: Cardiovascular;  Laterality: Left;     Home Meds: Prior to Admission medications   Medication Sig Start Date End Date Taking? Authorizing Provider  acetaminophen (TYLENOL) 325 MG tablet Take 2 tablets (650 mg total) by mouth every 6 (six) hours as needed for mild pain (or Fever >/= 101). Patient taking differently: Take 500 mg by mouth every 6 (six) hours as needed for mild pain (or Fever >/= 101).  09/21/16   Gouru, Aruna, MD  albuterol (PROVENTIL HFA;VENTOLIN HFA) 108 (90 Base) MCG/ACT inhaler Inhale 2 puffs into the lungs every 4 (four) hours as needed for wheezing or shortness of breath.     [provider]  amiodarone (PACERONE) 200 MG tablet Take 1 tablet (200 mg total) by mouth daily. 04/06/17   Theora Gianotti, NP    aspirin 81 MG chewable tablet Chew 81 mg by mouth. 07/21/16   [provider]  atorvastatin (LIPITOR) 40 MG tablet Take 1 tablet (40 mg total) by mouth daily. 02/19/17   Demetrios Loll, MD  calcium acetate (PHOSLO) 667 MG capsule Take 1,334 mg by mouth 3 (three) times daily with meals. Take 2001 mgs with meals 3 times daily and 1334 with snacks 12/11/15   [provider]  CALCIUM CARBONATE PO Take 1,000 mg by mouth 2 (two) times daily with a meal.    [provider]  carvedilol (COREG) 6.25 MG tablet Take 1 tablet (6.25 mg total) by mouth 2 (two) times daily with a meal. 09/21/16   Gouru, Aruna, MD  cinacalcet (SENSIPAR) 90 MG tablet Take 90 mg by mouth daily.    [provider]  furosemide (LASIX) 40 MG tablet Take 1 tablet (40  mg total) by mouth 2 (two) times daily. 02/18/17   Demetrios Loll, MD  hydrALAZINE (APRESOLINE) 50 MG tablet Take 100 mg by mouth 3 (three) times daily.  07/01/16   [provider]  ipratropium (ATROVENT HFA) 17 MCG/ACT inhaler Inhale 2 puffs into the lungs every 4 (four) hours as needed for wheezing.    [provider]  isosorbide mononitrate (IMDUR) 30 MG 24 hr tablet Take 1 tablet (30 mg total) by mouth daily. Patient taking differently: Take 30 mg by mouth 2 (two) times daily.  08/24/16 02/17/18  Dustin Flock, MD  lidocaine-prilocaine (EMLA) cream Apply 1 application topically as needed (dialysis access).    [provider]  multivitamin (RENA-VIT) TABS tablet Take 1 tablet by mouth daily.    [provider]  nitroGLYCERIN (NITROSTAT) 0.4 MG SL tablet Place 0.4 mg under the tongue every 5 (five) minutes as needed for chest pain.    [provider]  sacubitril-valsartan (ENTRESTO) 24-26 MG Take 1 tablet by mouth 2 (two) times daily. 03/16/17   Theora Gianotti, NP    Allergies:  Allergies  Allergen Reactions  . Sulfa Antibiotics Rash and Shortness Of Breath    Social History    Socioeconomic History  . Marital status: Widowed    Spouse name: Not on file  . Number of children: Not on file  . Years of education: Not on file  . Highest education level: Not on file  Occupational History  . Not on file  Social Needs  . Financial resource strain: Not on file  . Food insecurity:    Worry: Not on file    Inability: Not on file  . Transportation needs:    Medical: Not on file    Non-medical: Not on file  Tobacco Use  . Smoking status: Former Smoker    Packs/day: 0.25    Years: 40.00    Pack years: 10.00    Types: Cigarettes    Last attempt to quit: 10/12/2016    Years since quitting: 0.6  . Smokeless tobacco: Never Used  Substance and Sexual Activity  . Alcohol use: No    Alcohol/week: 0.0 oz  . Drug use: Yes    Types: Cocaine, Marijuana    Comment: patient states no drugs  . Sexual activity: Not Currently  Lifestyle  . Physical activity:    Days per week: Not on file    Minutes per session: Not on file  . Stress: Not on file  Relationships  . Social connections:    Talks on phone: Not on file    Gets together: Not on file    Attends religious service: Not on file    Active member of club or organization: Not on file    Attends meetings of clubs or organizations: Not on file    Relationship status: Not on file  . Intimate partner violence:    Fear of current or ex partner: Not on file    Emotionally abused: Not on file    Physically abused: Not on file    Forced sexual activity: Not on file  Other Topics Concern  . Not on file  Social History Narrative   Lives at home in [place] by himself.   Independent on ambulation but does not routinely exercise.     Family History  Problem Relation Age of Onset  . Hypertension Son   . Diabetes Son   . Hypertension Mother   . Diabetes Mother   . Diabetes Sister  ROS:  Please see the history of present illness.     All other systems reviewed and negative.    Physical Exam:  Blood  pressure (!) 150/64, pulse 69, height 5\' 8"  (1.727 m), weight 170 lb 12 oz (77.5 kg). General: Well developed, well nourished male in no acute distress. Head: Normocephalic, atraumatic, sclera non-icteric, no xanthomas, nares are without discharge. EENT: normal  Lymph Nodes:  none Neck: Negative for carotid bruits. JVD not elevated. Back:without scoliosis kyphosis Lungs: Clear bilaterally to auscultation without wheezes, rales, or rhonchi. Breathing is unlabored. Heart: RRR with S1 S2. 2/6 murmur . No rubs, or gallops appreciated. Abdomen: Soft, non-tender, non-distended with normoactive bowel sounds. No hepatomegaly. No rebound/guarding. No obvious abdominal masses. Msk:  Strength and tone appear normal for age. Extremities: No clubbing or cyanosis. No  edema.  Distal pedal pulses are 2+ and equal bilaterally.  Fistula left arm  skin: Warm and Dry Neuro: Alert and oriented X 3. CN III-XII intact Grossly normal sensory and motor function . Psych:  Responds to questions appropriately with a normal affect.      Labs: Cardiac Enzymes No results for input(s): CKTOTAL, CKMB, TROPONINI in the last 72 hours. CBC Lab Results  Component Value Date   WBC 6.1 04/04/2017   HGB 14.6 04/27/2017   HCT 43.0 04/27/2017   MCV 95.4 04/04/2017   PLT 168 04/04/2017   PROTIME: No results for input(s): LABPROT, INR in the last 72 hours. Chemistry No results for input(s): NA, K, CL, CO2, BUN, CREATININE, CALCIUM, PROT, BILITOT, ALKPHOS, ALT, AST, GLUCOSE in the last 168 hours.  Invalid input(s): LABALBU Lipids Lab Results  Component Value Date   CHOL 101 11/07/2012   HDL 39 (L) 11/07/2012   LDLCALC 38 11/07/2012   TRIG 119 11/07/2012   BNP No results found for: PROBNP Thyroid Function Tests: No results for input(s): TSH, T4TOTAL, T3FREE, THYROIDAB in the last 72 hours.  Invalid input(s): FREET3 Miscellaneous No results found for: DDIMER  Radiology/Studies:  No results found.  EKG:  Sinus rhythm at 69 Interval 16/11/48 LVH with repolarization abnormalities No PVCs    Assessment and Plan:  PVCs-multiple morphologies  Amiodarone for PVCs  NICM  ESRD   Coronary artery disease with prior infarct   The patient has had significant frequency of PVCs of multiple morphologies.  This is occurred concurrent with worsening LV dysfunction.  Amiodarone has been utilized at relatively low doses with the obliteration of his PVC burden.  We will plan to continue him on amiodarone with efforts to down titrate from 200--100 mg daily over the next 6 months.  We will have him come back in 3 months for reassessment of left ventricular function.  His  LV dysfunction may or may not be related to his PVC burden.   He has a prior infarct from his total RCA.  The overall contribution of this to his LV dysfunction is not clear  We will arrange for surveillance of his amiodarone labs through dialysis.  Virl Axe

## 2017-05-19 NOTE — Patient Instructions (Addendum)
Medication Instructions: - Your physician recommends that you continue on your current medications as directed. Please refer to the Current Medication list given to you today.  Labwork: - Your physician recommends that you have lab work at your next dialysis appointment: TSH/ Liver (please take the orders given to you today when you have this done)   Procedures/Testing: - none ordered  Follow-Up: - Your physician recommends that you schedule a follow-up appointment in: 3 months with Ignacia Bayley, NP   Any Additional Special Instructions Will Be Listed Below (If Applicable).     If you need a refill on your cardiac medications before your next appointment, please call your pharmacy.

## 2017-05-20 ENCOUNTER — Telehealth: Payer: Self-pay | Admitting: Internal Medicine

## 2017-05-20 DIAGNOSIS — I493 Ventricular premature depolarization: Secondary | ICD-10-CM

## 2017-05-20 DIAGNOSIS — I5042 Chronic combined systolic (congestive) and diastolic (congestive) heart failure: Secondary | ICD-10-CM

## 2017-05-20 NOTE — Telephone Encounter (Signed)
I called and spoke with the patient per Dr. Olin Pia request. He would like the patient to have an echo repeated when he comes back to see Ignacia Bayley, NP on 08/18/17. Ok to have the echo the same day of his office visit.  The patient is agreeable with the above and aware I will forward to scheduling to contact him with a time. He verbalizes understanding.   Will forward to support pool.

## 2017-05-25 NOTE — Telephone Encounter (Signed)
lmov

## 2017-05-30 NOTE — Telephone Encounter (Signed)
lmov

## 2017-05-31 ENCOUNTER — Other Ambulatory Visit (INDEPENDENT_AMBULATORY_CARE_PROVIDER_SITE_OTHER): Payer: Self-pay | Admitting: Vascular Surgery

## 2017-05-31 ENCOUNTER — Telehealth (INDEPENDENT_AMBULATORY_CARE_PROVIDER_SITE_OTHER): Payer: Self-pay

## 2017-05-31 MED ORDER — FENTANYL CITRATE (PF) 100 MCG/2ML IJ SOLN: 25.0000 ug | INTRAMUSCULAR | Status: DC | PRN

## 2017-05-31 MED ORDER — ONDANSETRON HCL 4 MG/2ML IJ SOLN: 4.0000 mg | Freq: Once | INTRAMUSCULAR | Status: DC | PRN

## 2017-05-31 MED ORDER — CEFAZOLIN SODIUM-DEXTROSE 2-4 GM/100ML-% IV SOLN
2.0000 g | INTRAVENOUS | Status: AC
  Administered 2017-06-01: 2 g via INTRAVENOUS
  Filled 2017-05-31: qty 100

## 2017-05-31 NOTE — Telephone Encounter (Signed)
Pt scheduled for 06/06/17

## 2017-05-31 NOTE — Telephone Encounter (Signed)
Pre-admit called stating that Maudie Mercury ordered a lot of testing for this patient, but the test that's in question is the EKG, because the patient has had an EKG done in late February or early March of this year.  So should the patient have another done in the morning? If not, then they need Maudie Mercury to go in and delete the order.

## 2017-06-01 ENCOUNTER — Encounter
Admission: RE | Disposition: A | Discharge status: Home / Self Care | Payer: Self-pay | Source: Ambulatory Visit | Attending: Vascular Surgery

## 2017-06-01 ENCOUNTER — Other Ambulatory Visit: Payer: Self-pay

## 2017-06-01 ENCOUNTER — Ambulatory Visit
Admission: RE | Admit: 2017-06-01 | Discharge: 2017-06-01 | Disposition: A | Discharge status: Home / Self Care | Payer: Medicare Other | Source: Ambulatory Visit | Attending: Vascular Surgery | Admitting: Vascular Surgery

## 2017-06-01 ENCOUNTER — Ambulatory Visit: Payer: Medicare Other | Admitting: Anesthesiology

## 2017-06-01 ENCOUNTER — Encounter: Payer: Self-pay | Admitting: *Deleted

## 2017-06-01 DIAGNOSIS — M1990 Unspecified osteoarthritis, unspecified site: Secondary | ICD-10-CM | POA: Insufficient documentation

## 2017-06-01 DIAGNOSIS — T82868A Thrombosis of vascular prosthetic devices, implants and grafts, initial encounter: Secondary | ICD-10-CM | POA: Insufficient documentation

## 2017-06-01 DIAGNOSIS — I272 Pulmonary hypertension, unspecified: Secondary | ICD-10-CM | POA: Diagnosis not present

## 2017-06-01 DIAGNOSIS — I10 Essential (primary) hypertension: Secondary | ICD-10-CM

## 2017-06-01 DIAGNOSIS — I739 Peripheral vascular disease, unspecified: Secondary | ICD-10-CM | POA: Insufficient documentation

## 2017-06-01 DIAGNOSIS — R9431 Abnormal electrocardiogram [ECG] [EKG]: Secondary | ICD-10-CM

## 2017-06-01 DIAGNOSIS — Z87891 Personal history of nicotine dependence: Secondary | ICD-10-CM | POA: Diagnosis not present

## 2017-06-01 DIAGNOSIS — N186 End stage renal disease: Secondary | ICD-10-CM | POA: Diagnosis not present

## 2017-06-01 DIAGNOSIS — I428 Other cardiomyopathies: Secondary | ICD-10-CM | POA: Diagnosis not present

## 2017-06-01 DIAGNOSIS — E1122 Type 2 diabetes mellitus with diabetic chronic kidney disease: Secondary | ICD-10-CM | POA: Diagnosis not present

## 2017-06-01 DIAGNOSIS — Z882 Allergy status to sulfonamides status: Secondary | ICD-10-CM | POA: Insufficient documentation

## 2017-06-01 DIAGNOSIS — I5042 Chronic combined systolic (congestive) and diastolic (congestive) heart failure: Secondary | ICD-10-CM | POA: Diagnosis not present

## 2017-06-01 DIAGNOSIS — I351 Nonrheumatic aortic (valve) insufficiency: Secondary | ICD-10-CM | POA: Diagnosis not present

## 2017-06-01 DIAGNOSIS — I491 Atrial premature depolarization: Secondary | ICD-10-CM | POA: Diagnosis not present

## 2017-06-01 DIAGNOSIS — I252 Old myocardial infarction: Secondary | ICD-10-CM | POA: Insufficient documentation

## 2017-06-01 DIAGNOSIS — I132 Hypertensive heart and chronic kidney disease with heart failure and with stage 5 chronic kidney disease, or end stage renal disease: Secondary | ICD-10-CM | POA: Diagnosis not present

## 2017-06-01 DIAGNOSIS — E119 Type 2 diabetes mellitus without complications: Secondary | ICD-10-CM

## 2017-06-01 DIAGNOSIS — R0600 Dyspnea, unspecified: Secondary | ICD-10-CM | POA: Diagnosis not present

## 2017-06-01 DIAGNOSIS — Z833 Family history of diabetes mellitus: Secondary | ICD-10-CM | POA: Insufficient documentation

## 2017-06-01 DIAGNOSIS — I2510 Atherosclerotic heart disease of native coronary artery without angina pectoris: Secondary | ICD-10-CM | POA: Diagnosis not present

## 2017-06-01 DIAGNOSIS — X58XXXA Exposure to other specified factors, initial encounter: Secondary | ICD-10-CM | POA: Diagnosis not present

## 2017-06-01 DIAGNOSIS — Z8249 Family history of ischemic heart disease and other diseases of the circulatory system: Secondary | ICD-10-CM | POA: Diagnosis not present

## 2017-06-01 HISTORY — PX: AV FISTULA PLACEMENT: SHX1204

## 2017-06-01 LAB — CBC WITH DIFFERENTIAL/PLATELET
BASOS ABS: 0.1 10*3/uL (ref 0–0.1)
Basophils Relative: 1 %
Eosinophils Absolute: 0.1 10*3/uL (ref 0–0.7)
Eosinophils Relative: 1 %
HEMATOCRIT: 40.2 % (ref 40.0–52.0)
HEMOGLOBIN: 12.9 g/dL — AB (ref 13.0–18.0)
LYMPHS PCT: 13 %
Lymphs Abs: 0.9 10*3/uL — ABNORMAL LOW (ref 1.0–3.6)
MCH: 30.8 pg (ref 26.0–34.0)
MCHC: 32 g/dL (ref 32.0–36.0)
MCV: 96.2 fL (ref 80.0–100.0)
Monocytes Absolute: 1 10*3/uL (ref 0.2–1.0)
Monocytes Relative: 15 %
NEUTROS PCT: 70 %
Neutro Abs: 4.9 10*3/uL (ref 1.4–6.5)
PLATELETS: 193 10*3/uL (ref 150–440)
RBC: 4.18 MIL/uL — AB (ref 4.40–5.90)
RDW: 16.8 % — ABNORMAL HIGH (ref 11.5–14.5)
WBC: 7 10*3/uL (ref 3.8–10.6)

## 2017-06-01 LAB — PROTIME-INR
INR: 0.97
Prothrombin Time: 12.8 seconds (ref 11.4–15.2)

## 2017-06-01 LAB — TYPE AND SCREEN
ABO/RH(D): O POS
ANTIBODY SCREEN: NEGATIVE

## 2017-06-01 LAB — BASIC METABOLIC PANEL
Anion gap: 9 (ref 5–15)
BUN: 27 mg/dL — ABNORMAL HIGH (ref 6–20)
CHLORIDE: 107 mmol/L (ref 101–111)
CO2: 25 mmol/L (ref 22–32)
Calcium: 9 mg/dL (ref 8.9–10.3)
Creatinine, Ser: 5.6 mg/dL — ABNORMAL HIGH (ref 0.61–1.24)
GFR calc Af Amer: 11 mL/min — ABNORMAL LOW (ref >60)
GFR, EST NON AFRICAN AMERICAN: 10 mL/min — AB (ref >60)
Glucose, Bld: 82 mg/dL (ref 65–99)
POTASSIUM: 4.2 mmol/L (ref 3.5–5.1)
SODIUM: 141 mmol/L (ref 135–145)

## 2017-06-01 LAB — GLUCOSE, CAPILLARY: Glucose-Capillary: 135 mg/dL — ABNORMAL HIGH (ref 65–99)

## 2017-06-01 LAB — APTT: APTT: 32 s (ref 24–36)

## 2017-06-01 SURGERY — INSERTION OF ARTERIOVENOUS (AV) GORE-TEX GRAFT ARM
Anesthesia: General | Laterality: Left | Wound class: Clean

## 2017-06-01 MED ORDER — FENTANYL CITRATE (PF) 100 MCG/2ML IJ SOLN
INTRAMUSCULAR | Status: AC
  Filled 2017-06-01: qty 2

## 2017-06-01 MED ORDER — GLYCOPYRROLATE 0.2 MG/ML IJ SOLN
INTRAMUSCULAR | Status: AC
  Filled 2017-06-01: qty 1

## 2017-06-01 MED ORDER — MIDAZOLAM HCL 2 MG/2ML IJ SOLN
INTRAMUSCULAR | Status: DC | PRN
  Administered 2017-06-01: 2 mg via INTRAVENOUS

## 2017-06-01 MED ORDER — BUPIVACAINE HCL (PF) 0.5 % IJ SOLN
INTRAMUSCULAR | Status: AC
  Filled 2017-06-01: qty 30

## 2017-06-01 MED ORDER — BUPIVACAINE-EPINEPHRINE (PF) 0.25% -1:200000 IJ SOLN
INTRAMUSCULAR | Status: AC
Start: 2017-06-01 — End: 2017-06-01
  Filled 2017-06-01: qty 30

## 2017-06-01 MED ORDER — CHLORHEXIDINE GLUCONATE CLOTH 2 % EX PADS: 6.0000 | MEDICATED_PAD | Freq: Once | CUTANEOUS | Status: DC

## 2017-06-01 MED ORDER — KETAMINE HCL 50 MG/ML IJ SOLN
INTRAMUSCULAR | Status: AC
  Filled 2017-06-01: qty 10

## 2017-06-01 MED ORDER — ONDANSETRON HCL 4 MG/2ML IJ SOLN
INTRAMUSCULAR | Status: AC
  Filled 2017-06-01: qty 2

## 2017-06-01 MED ORDER — PROPOFOL 10 MG/ML IV BOLUS
INTRAVENOUS | Status: DC | PRN
  Administered 2017-06-01: 40 mg via INTRAVENOUS
  Administered 2017-06-01: 80 mg via INTRAVENOUS
  Administered 2017-06-01: 30 mg via INTRAVENOUS

## 2017-06-01 MED ORDER — DEXAMETHASONE SODIUM PHOSPHATE 10 MG/ML IJ SOLN
INTRAMUSCULAR | Status: AC
  Filled 2017-06-01: qty 1

## 2017-06-01 MED ORDER — LABETALOL HCL 5 MG/ML IV SOLN
INTRAVENOUS | Status: DC | PRN
  Administered 2017-06-01 (×2): 5 mg via INTRAVENOUS

## 2017-06-01 MED ORDER — PROPOFOL 10 MG/ML IV BOLUS
INTRAVENOUS | Status: AC
  Filled 2017-06-01: qty 20

## 2017-06-01 MED ORDER — ONDANSETRON HCL 4 MG/2ML IJ SOLN: 4.0000 mg | Freq: Once | INTRAMUSCULAR | Status: DC | PRN

## 2017-06-01 MED ORDER — MIDAZOLAM HCL 2 MG/2ML IJ SOLN
INTRAMUSCULAR | Status: AC
  Filled 2017-06-01: qty 2

## 2017-06-01 MED ORDER — LIDOCAINE HCL (PF) 2 % IJ SOLN
INTRAMUSCULAR | Status: AC
  Filled 2017-06-01: qty 10

## 2017-06-01 MED ORDER — OXYCODONE-ACETAMINOPHEN 5-325 MG PO TABS: 1.0000 | ORAL_TABLET | Freq: Four times a day (QID) | ORAL | 0 refills | Status: DC | PRN

## 2017-06-01 MED ORDER — DEXAMETHASONE SODIUM PHOSPHATE 10 MG/ML IJ SOLN
INTRAMUSCULAR | Status: DC | PRN
  Administered 2017-06-01: 5 mg via INTRAVENOUS

## 2017-06-01 MED ORDER — GLYCOPYRROLATE 0.2 MG/ML IJ SOLN
INTRAMUSCULAR | Status: DC | PRN
  Administered 2017-06-01: 0.2 mg via INTRAVENOUS

## 2017-06-01 MED ORDER — PHENYLEPHRINE HCL 10 MG/ML IJ SOLN
INTRAMUSCULAR | Status: DC | PRN
  Administered 2017-06-01: 100 ug via INTRAVENOUS
  Administered 2017-06-01: 200 ug via INTRAVENOUS
  Administered 2017-06-01 (×2): 100 ug via INTRAVENOUS
  Administered 2017-06-01: 200 ug via INTRAVENOUS

## 2017-06-01 MED ORDER — GLYCOPYRROLATE 0.2 MG/ML IJ SOLN
INTRAMUSCULAR | Status: AC
  Filled 2017-06-01: qty 2

## 2017-06-01 MED ORDER — BUPIVACAINE-EPINEPHRINE (PF) 0.25% -1:200000 IJ SOLN
INTRAMUSCULAR | Status: DC | PRN
  Administered 2017-06-01: 30 mL via PERINEURAL

## 2017-06-01 MED ORDER — PAPAVERINE HCL 30 MG/ML IJ SOLN
INTRAMUSCULAR | Status: AC
Start: 2017-06-01 — End: 2017-06-01
  Filled 2017-06-01: qty 2

## 2017-06-01 MED ORDER — LABETALOL HCL 5 MG/ML IV SOLN
INTRAVENOUS | Status: AC
  Filled 2017-06-01: qty 4

## 2017-06-01 MED ORDER — ONDANSETRON HCL 4 MG/2ML IJ SOLN
INTRAMUSCULAR | Status: DC | PRN
  Administered 2017-06-01: 4 mg via INTRAVENOUS

## 2017-06-01 MED ORDER — FENTANYL CITRATE (PF) 100 MCG/2ML IJ SOLN
INTRAMUSCULAR | Status: DC | PRN
  Administered 2017-06-01 (×5): 25 ug via INTRAVENOUS
  Administered 2017-06-01: 50 ug via INTRAVENOUS
  Administered 2017-06-01: 25 ug via INTRAVENOUS

## 2017-06-01 MED ORDER — OXYCODONE-ACETAMINOPHEN 5-325 MG PO TABS
ORAL_TABLET | ORAL | Status: AC
  Administered 2017-06-01: 2 via ORAL
  Filled 2017-06-01: qty 2

## 2017-06-01 MED ORDER — LIDOCAINE HCL (CARDIAC) 20 MG/ML IV SOLN
INTRAVENOUS | Status: DC | PRN
  Administered 2017-06-01: 60 mg via INTRAVENOUS

## 2017-06-01 MED ORDER — HEPARIN SODIUM (PORCINE) 5000 UNIT/ML IJ SOLN
INTRAMUSCULAR | Status: AC
  Filled 2017-06-01: qty 1

## 2017-06-01 MED ORDER — KETAMINE HCL 50 MG/ML IJ SOLN
INTRAMUSCULAR | Status: DC | PRN
  Administered 2017-06-01: 50 mg via INTRAMUSCULAR

## 2017-06-01 MED ORDER — EPHEDRINE SULFATE 50 MG/ML IJ SOLN
INTRAMUSCULAR | Status: DC | PRN
  Administered 2017-06-01: 10 mg via INTRAVENOUS

## 2017-06-01 MED ORDER — SODIUM CHLORIDE 0.9 % IV SOLN
INTRAVENOUS | Status: DC
  Administered 2017-06-01 (×2): via INTRAVENOUS

## 2017-06-01 MED ORDER — OXYCODONE-ACETAMINOPHEN 5-325 MG PO TABS
1.0000 | ORAL_TABLET | ORAL | Status: DC | PRN
  Administered 2017-06-01: 2 via ORAL

## 2017-06-01 MED ORDER — NEOSTIGMINE METHYLSULFATE 10 MG/10ML IV SOLN
INTRAVENOUS | Status: AC
  Filled 2017-06-01: qty 1

## 2017-06-01 MED ORDER — CEFAZOLIN SODIUM-DEXTROSE 2-3 GM-%(50ML) IV SOLR
INTRAVENOUS | Status: AC
  Filled 2017-06-01: qty 50

## 2017-06-01 MED ORDER — FENTANYL CITRATE (PF) 100 MCG/2ML IJ SOLN: 25.0000 ug | INTRAMUSCULAR | Status: DC | PRN

## 2017-06-01 MED ORDER — SODIUM CHLORIDE 0.9 % IV SOLN
INTRAVENOUS | Status: DC | PRN
  Administered 2017-06-01: 08:00:00 80 mL via INTRAMUSCULAR

## 2017-06-01 SURGICAL SUPPLY — 62 items
APPLIER CLIP 11 MED OPEN (CLIP)
APPLIER CLIP 9.375 SM OPEN (CLIP)
BAG COUNTER SPONGE EZ (MISCELLANEOUS) ×2 IMPLANT
BAG DECANTER FOR FLEXI CONT (MISCELLANEOUS) ×3 IMPLANT
BLADE SURG SZ11 CARB STEEL (BLADE) ×3 IMPLANT
BOOT SUTURE AID YELLOW STND (SUTURE) ×3 IMPLANT
BRUSH SCRUB EZ  4% CHG (MISCELLANEOUS) ×2
BRUSH SCRUB EZ 4% CHG (MISCELLANEOUS) ×1 IMPLANT
CANISTER SUCT 1200ML W/VALVE (MISCELLANEOUS) ×3 IMPLANT
CHLORAPREP W/TINT 26ML (MISCELLANEOUS) ×3 IMPLANT
CLIP APPLIE 11 MED OPEN (CLIP) IMPLANT
CLIP APPLIE 9.375 SM OPEN (CLIP) IMPLANT
COUNTER SPONGE BAG EZ (MISCELLANEOUS) ×1
DERMABOND ADVANCED (GAUZE/BANDAGES/DRESSINGS) ×2
DERMABOND ADVANCED .7 DNX12 (GAUZE/BANDAGES/DRESSINGS) ×1 IMPLANT
DRESSING SURGICEL FIBRLLR 1X2 (HEMOSTASIS) IMPLANT
DRSG SURGICEL FIBRILLAR 1X2 (HEMOSTASIS)
ELECT CAUTERY BLADE 6.4 (BLADE) ×3 IMPLANT
ELECT REM PT RETURN 9FT ADLT (ELECTROSURGICAL) ×3
ELECTRODE REM PT RTRN 9FT ADLT (ELECTROSURGICAL) ×1 IMPLANT
GLOVE BIO SURGEON STRL SZ7 (GLOVE) ×3 IMPLANT
GLOVE INDICATOR 7.5 STRL GRN (GLOVE) ×3 IMPLANT
GLOVE SURG SYN 8.0 (GLOVE) ×3 IMPLANT
GOWN STRL REUS W/ TWL LRG LVL3 (GOWN DISPOSABLE) ×2 IMPLANT
GOWN STRL REUS W/ TWL XL LVL3 (GOWN DISPOSABLE) ×1 IMPLANT
GOWN STRL REUS W/TWL LRG LVL3 (GOWN DISPOSABLE) ×4
GOWN STRL REUS W/TWL XL LVL3 (GOWN DISPOSABLE) ×2
GRAFT PROPATEN STD WALL 4 7X45 (Vascular Products) ×3 IMPLANT
IV NS 500ML (IV SOLUTION) ×2
IV NS 500ML BAXH (IV SOLUTION) ×1 IMPLANT
KIT TURNOVER KIT A (KITS) ×3 IMPLANT
LABEL OR SOLS (LABEL) ×3 IMPLANT
LOOP RED MAXI  1X406MM (MISCELLANEOUS) ×2
LOOP VESSEL MAXI 1X406 RED (MISCELLANEOUS) ×1 IMPLANT
LOOP VESSEL MINI 0.8X406 BLUE (MISCELLANEOUS) ×2 IMPLANT
LOOPS BLUE MINI 0.8X406MM (MISCELLANEOUS) ×4
NEEDLE FILTER BLUNT 18X 1/2SAF (NEEDLE) ×2
NEEDLE FILTER BLUNT 18X1 1/2 (NEEDLE) ×1 IMPLANT
NS IRRIG 500ML POUR BTL (IV SOLUTION) ×3 IMPLANT
PACK EXTREMITY ARMC (MISCELLANEOUS) ×3 IMPLANT
PAD PREP 24X41 OB/GYN DISP (PERSONAL CARE ITEMS) ×3 IMPLANT
PUNCH SURGICAL ROTATE 2.7MM (MISCELLANEOUS) IMPLANT
STOCKINETTE STRL 4IN 9604848 (GAUZE/BANDAGES/DRESSINGS) ×3 IMPLANT
SUT GTX CV-6 30 (SUTURE) ×6 IMPLANT
SUT MNCRL 4-0 (SUTURE) ×2
SUT MNCRL 4-0 27XMFL (SUTURE) ×1
SUT MNCRL+ 5-0 UNDYED PC-3 (SUTURE) ×1 IMPLANT
SUT MONOCRYL 5-0 (SUTURE) ×2
SUT PROLENE 6 0 BV (SUTURE) ×6 IMPLANT
SUT SILK 2 0 (SUTURE) ×2
SUT SILK 2 0 SH (SUTURE) ×3 IMPLANT
SUT SILK 2-0 18XBRD TIE 12 (SUTURE) ×1 IMPLANT
SUT SILK 3 0 (SUTURE) ×2
SUT SILK 3-0 18XBRD TIE 12 (SUTURE) ×1 IMPLANT
SUT SILK 4 0 (SUTURE) ×2
SUT SILK 4-0 18XBRD TIE 12 (SUTURE) ×1 IMPLANT
SUT VIC AB 3-0 SH 27 (SUTURE) ×4
SUT VIC AB 3-0 SH 27X BRD (SUTURE) ×2 IMPLANT
SUTURE MNCRL 4-0 27XMF (SUTURE) ×1 IMPLANT
SYR 20CC LL (SYRINGE) ×3 IMPLANT
SYR 3ML LL SCALE MARK (SYRINGE) ×3 IMPLANT
TAPE TRANSPORE STRL 2 31045 (GAUZE/BANDAGES/DRESSINGS) ×3 IMPLANT

## 2017-06-01 NOTE — Anesthesia Preprocedure Evaluation (Addendum)
Anesthesia Evaluation  Patient identified by MRN, date of birth, ID band Patient awake    Reviewed: Allergy & Precautions, NPO status , Patient's Chart, lab work & pertinent test results, reviewed documented beta blocker date and time   Airway Mallampati: II  TM Distance: >3 FB     Dental  (+) Chipped, Upper Dentures, Lower Dentures   Pulmonary shortness of breath, pneumonia, resolved, former smoker,           Cardiovascular hypertension, Pt. on medications and Pt. on home beta blockers + CAD, + Past MI, + Peripheral Vascular Disease and +CHF       Neuro/Psych    GI/Hepatic (+) Hepatitis -  Endo/Other  diabetes, Type 2  Renal/GU Renal disease     Musculoskeletal  (+) Arthritis ,   Abdominal   Peds  Hematology   Anesthesia Other Findings PVCs. EF 30-40.  Reproductive/Obstetrics                            Anesthesia Physical Anesthesia Plan  ASA: III  Anesthesia Plan: General   Post-op Pain Management:    Induction: Intravenous  PONV Risk Score and Plan:   Airway Management Planned: LMA  Additional Equipment:   Intra-op Plan:   Post-operative Plan:   Informed Consent: I have reviewed the patients History and Physical, chart, labs and discussed the procedure including the risks, benefits and alternatives for the proposed anesthesia with the patient or authorized representative who has indicated his/her understanding and acceptance.     Plan Discussed with: CRNA  Anesthesia Plan Comments:         Anesthesia Quick Evaluation

## 2017-06-01 NOTE — Telephone Encounter (Signed)
The patient was scheduled for the EKG this morning, the patient did not have the EKG done, they took the EKG that the patient had on file from late February.

## 2017-06-01 NOTE — Anesthesia Postprocedure Evaluation (Signed)
Anesthesia Post Note  Patient: [name]  Procedure(s) Performed: INSERTION OF ARTERIOVENOUS (AV) GORE-TEX GRAFT ARM ( BRACHIAL AXILLARY ) (Left )  Patient location during evaluation: PACU Anesthesia Type: General Level of consciousness: awake and alert Pain management: pain level controlled Vital Signs Assessment: post-procedure vital signs reviewed and stable Respiratory status: spontaneous breathing, nonlabored ventilation, respiratory function stable and patient connected to nasal cannula oxygen Cardiovascular status: blood pressure returned to baseline and stable Postop Assessment: no apparent nausea or vomiting Anesthetic complications: no     Last Vitals:  Vitals:   06/01/17 1205 06/01/17 1247  BP: 122/66 126/79  Pulse: 72 75  Resp: 16 16  Temp: 36.8 C   SpO2: 93% 93%    Last Pain:  Vitals:   06/01/17 1247  TempSrc:   PainSc: 0-No pain                 Nadia Viar S

## 2017-06-01 NOTE — H&P (Signed)
[name] Admission History & Physical  MRN : [id number]  [name] is a 62 y.o. ([birth year]) male who presents with chief complaint of need dialysis access.  History of Present Illness:  The patient is seen for evaluation of dialysis access. He was last seen in the office 04/27/2017.     Current access is via a catheter which is functioning poorly.  There have been several episodes of catheter infection.  The patient denies amaurosis fugax or recent TIA symptoms. There are no recent neurological changes noted. The patient denies claudication symptoms or rest pain symptoms. The patient denies history of DVT, PE or superficial thrombophlebitis. The patient denies recent episodes of angina or shortness of breath.    Current Facility-Administered Medications  Medication Dose Route Frequency Provider Last Rate Last Dose  . 0.9 %  sodium chloride infusion   Intravenous Continuous Piscitello, [name], MD      . ceFAZolin (ANCEF) 2-3 GM-%(50ML) IVPB SOLR           . ceFAZolin (ANCEF) IVPB 2g/100 mL premix  2 g Intravenous On Call to [place], Janalyn Harder, PA-C      . fentaNYL (SUBLIMAZE) injection 25-50 mcg  25-50 mcg Intravenous Q5 min PRN Martha Clan, MD      . ondansetron [facility]) injection 4 mg  4 mg Intravenous Once PRN Martha Clan, MD        Past Medical History:  Diagnosis Date  . Arthritis   . CAD (coronary artery disease)    a. 09/2013 Myoview [facility]): basal inf defect more pronounced @ rest, likely artifact, EF 48%, low risk study; b. 06/2016 Cath [facility]): LAD 10p, 30-59m, 40-50d, D1 90(small), D2 40, D3 60-70, D4 30, LCX 10p, 77m, 20d, Om1 30, OM3 40, RCA 100p w/ bridging collats, m/d RCA fill via collats, small/mod caliber-->Med Rx; c. 02/2017 MV: inf infarct w/ peri-inf ischemia. EF <30%.   . Chronic combined systolic and diastolic CHF (congestive heart failure) ([name])    a. 10/2013 Echo [facility]): EF 50%, diast dysfxn;  b. 10/2014 Echo: EF 30-35%, diff  HK, gr1 DD;  c. 04/2016 Echo: EF 20-25%, sev dil LV, diff HK, gr3 DD; d. 06/2016 Echo: EF 30-35%, Gr2 DD.  . Diabetes ([place])   . Dyspnea   . ESRD (end stage renal disease) ([name])    a. MWF dialysis @ [facility].  . Hepatitis    Patient is unsure type of Hepatitis he has  . Hypertension   . Hypertensive heart disease with CHF (congestive heart failure) ([name])   . Mixed Ischemic and non-ischemic cardiomyopathy    a. 10/2013 Echo [facility]): EF 50%, diast dysfxn;  b. 10/2014 Echo: EF 30-35%, diff HK, gr1 DD;  c. 04/2016 Echo: EF 20-25%, sev dil LV, diff HK, gr3 DD; d. 06/2016 Echo: EF 30-35%, Gr2 DD.  [name] Moderate Aortic insufficiency    a. 04/2016 Echo: Mod AI.  [name] Myocardial infarction ([name])   . Peripheral vascular disease ([place])   . Pulmonary hypertension ([place])    a. 10/2014 Echo: Mod-Sev PAH.  [name] PVC's (premature ventricular contractions)    a. 02/2017 24 hr holter: freq polymorphic PVCs w/ total of 38K beats in 24 hrs (34% burden).    Past Surgical History:  Procedure Laterality Date  . A/V FISTULAGRAM Left 04/06/2016   Procedure: A/V Fistulagram;  Surgeon: Katha Cabal, MD;  Location: [place] CV LAB;  Service: Cardiovascular;  Laterality: Left;  . A/V FISTULAGRAM Left 01/19/2017  Procedure: A/V FISTULAGRAM;  Surgeon: Katha Cabal, MD;  Location: [place] CV LAB;  Service: Cardiovascular;  Laterality: Left;  . A/V SHUNT INTERVENTION N/A 04/06/2016   Procedure: A/V Shunt Intervention;  Surgeon: Katha Cabal, MD;  Location: [place] CV LAB;  Service: Cardiovascular;  Laterality: N/A;  . A/V SHUNT INTERVENTION N/A 06/29/2016   Procedure: A/V Shunt Intervention;  Surgeon: Katha Cabal, MD;  Location: [place] CV LAB;  Service: Cardiovascular;  Laterality: N/A;  . AV FISTULA PLACEMENT Left 2014  . DIALYSIS/PERMA CATHETER INSERTION Right    Dr. Delana Meyer  . PERIPHERAL VASCULAR CATHETERIZATION N/A 10/03/2014   Procedure: A/V Shuntogram/Fistulagram;  Surgeon: Algernon Huxley, MD;  Location: [place] CV LAB;  Service: Cardiovascular;  Laterality: N/A;  . PERIPHERAL VASCULAR CATHETERIZATION Left 10/03/2014   Procedure: A/V Shunt Intervention;  Surgeon: Algernon Huxley, MD;  Location: [place] CV LAB;  Service: Cardiovascular;  Laterality: Left;  . PERIPHERAL VASCULAR CATHETERIZATION Left 05/01/2015   Procedure: A/V Shuntogram/Fistulagram;  Surgeon: Algernon Huxley, MD;  Location: [place] CV LAB;  Service: Cardiovascular;  Laterality: Left;  . PERIPHERAL VASCULAR CATHETERIZATION N/A 05/01/2015   Procedure: A/V Shunt Intervention;  Surgeon: Algernon Huxley, MD;  Location: [place] CV LAB;  Service: Cardiovascular;  Laterality: N/A;  . UPPER EXTREMITY ANGIOGRAPHY Bilateral 06/29/2016   Procedure: Upper Extremity Angiography;  Surgeon: Katha Cabal, MD;  Location: [place] CV LAB;  Service: Cardiovascular;  Laterality: Bilateral;  . UPPER EXTREMITY INTERVENTION  06/29/2016   Procedure: Upper Extremity Intervention;  Surgeon: Katha Cabal, MD;  Location: [place] CV LAB;  Service: Cardiovascular;;  . UPPER EXTREMITY VENOGRAPHY Left 03/15/2017   Procedure: UPPER EXTREMITY VENOGRAPHY;  Surgeon: Katha Cabal, MD;  Location: [place] CV LAB;  Service: Cardiovascular;  Laterality: Left;    Social History Social History   Tobacco Use  . Smoking status: Former Smoker    Packs/day: 0.25    Years: 40.00    Pack years: 10.00    Types: Cigarettes    Last attempt to quit: 10/12/2016    Years since quitting: 0.6  . Smokeless tobacco: Never Used  Substance Use Topics  . Alcohol use: No    Alcohol/week: 0.0 oz  . Drug use: Yes    Types: Cocaine, Marijuana    Comment: patient states no drugs    Family History Family History  Problem Relation Age of Onset  . Hypertension Son   . Diabetes Son   . Hypertension Mother   . Diabetes Mother   . Diabetes Sister     No family history of bleeding or clotting disorders, autoimmune disease or  porphyria  Allergies  Allergen Reactions  . Sulfa Antibiotics Rash and Shortness Of Breath     REVIEW OF SYSTEMS (Negative unless checked)  Constitutional: [] Weight loss  [] Fever  [] Chills Cardiac: [] Chest pain   [] Chest pressure   [] Palpitations   [] Shortness of breath when laying flat   [] Shortness of breath at rest   [x] Shortness of breath with exertion. Vascular:  [] Pain in legs with walking   [] Pain in legs at rest   [] Pain in legs when laying flat   [] Claudication   [] Pain in feet when walking  [] Pain in feet at rest  [] Pain in feet when laying flat   [] History of DVT   [] Phlebitis   [] Swelling in legs   [] Varicose veins   [] Non-healing ulcers Pulmonary:   [] Uses home oxygen   [] Productive  cough   [] Hemoptysis   [] Wheeze  [] COPD   [] Asthma Neurologic:  [] Dizziness  [] Blackouts   [] Seizures   [] History of stroke   [] History of TIA  [] Aphasia   [] Temporary blindness   [] Dysphagia   [] Weakness or numbness in arms   [] Weakness or numbness in legs Musculoskeletal:  [] Arthritis   [] Joint swelling   [] Joint pain   [] Low back pain Hematologic:  [] Easy bruising  [] Easy bleeding   [] Hypercoagulable state   [] Anemic  [] Hepatitis Gastrointestinal:  [] Blood in stool   [] Vomiting blood  [] Gastroesophageal reflux/heartburn   [] Difficulty swallowing. Genitourinary:  [x] Chronic kidney disease   [] Difficult urination  [] Frequent urination  [] Burning with urination   [] Blood in urine Skin:  [] Rashes   [] Ulcers   [] Wounds Psychological:  [] History of anxiety   []  History of major depression.  Physical Examination  Vitals:   06/01/17 0658  BP: (!) 165/109  Pulse: 75  Resp: 16  Temp: 98.5 F (36.9 C)  TempSrc: Oral  SpO2: 100%   There is no height or weight on file to calculate BMI. Gen: WD/WN, NAD Head: [place]/AT, No temporalis wasting. Prominent temp pulse not noted. Ear/Nose/Throat: Hearing grossly intact, nares w/o erythema or drainage, oropharynx w/o Erythema/Exudate,  Eyes: Conjunctiva clear,  sclera non-icteric Neck: Trachea midline.  No JVD.  Pulmonary:  Good air movement, respirations not labored, no use of accessory muscles.  Cardiac: RRR, normal S1, S2. Vascular: right IJ tunneled cath CD&I Vessel Right Left  Radial Palpable Palpable  Ulnar Not Palpable Not Palpable  Brachial Palpable Palpable  Carotid Palpable, without bruit Palpable, without bruit  Gastrointestinal: soft, non-tender/non-distended. No guarding/reflex.  Musculoskeletal: M/S 5/5 throughout.  Extremities without ischemic changes.  No deformity or atrophy.  Neurologic: Sensation grossly intact in extremities.  Symmetrical.  Speech is fluent. Motor exam as listed above. Psychiatric: Judgment intact, Mood & affect appropriate for pt's clinical situation. Dermatologic: No rashes or ulcers noted.  No cellulitis or open wounds. Lymph : No Cervical, Axillary, or Inguinal lymphadenopathy.   CBC Lab Results  Component Value Date   WBC 7.0 06/01/2017   HGB 12.9 (L) 06/01/2017   HCT 40.2 06/01/2017   MCV 96.2 06/01/2017   PLT 193 06/01/2017    BMET    Component Value Date/Time   NA 141 06/01/2017 0636   NA 139 04/30/2013 1203   K 4.2 06/01/2017 0636   K 4.4 04/30/2013 1203   CL 107 06/01/2017 0636   CL 107 04/30/2013 1203   CO2 25 06/01/2017 0636   CO2 25 04/30/2013 1203   GLUCOSE 82 06/01/2017 0636   GLUCOSE 104 (H) 04/30/2013 1203   BUN 27 (H) 06/01/2017 0636   BUN 76 (H) 04/30/2013 1203   CREATININE 5.60 (H) 06/01/2017 0636   CREATININE 9.49 (H) 04/30/2013 1203   CALCIUM 9.0 06/01/2017 0636   CALCIUM 8.0 (L) 04/30/2013 1203   GFRNONAA 10 (L) 06/01/2017 0636   GFRNONAA 5 (L) 04/30/2013 1203   GFRAA 11 (L) 06/01/2017 0636   GFRAA 6 (L) 04/30/2013 1203   Estimated Creatinine Clearance: 13.1 mL/min (A) (by C-G formula based on SCr of 5.6 mg/dL (H)).  COAG Lab Results  Component Value Date   INR 0.97 06/01/2017   INR 1.04 03/31/2017   INR 1.10 07/27/2016    Radiology No results  found.  Assessment/Plan 1.  Complication dialysis device with thrombosis AV access:  Patient's catheter dialysis access is malfunctioning. The patient will undergo placement of a Left brachial axillary AV graft.  The risks and benefits were described to the patient.  All questions were answered.  The patient agrees to proceed with surgery. Potassium will be drawn to ensure that it is an appropriate level prior to performing intervention.   2.  End-stage renal disease requiring hemodialysis:  Patient will continue dialysis therapy without further interruption.   3.  Hypertension:  Patient will continue medical management; nephrology is following no changes in oral medications.  4. Diabetes mellitus:  Glucose will be monitored and oral medications been held this morning once the patient has undergone the patient's procedure po intake will be reinitiated and again Accu-Cheks will be used to assess the blood glucose level and treat as needed. The patient will be restarted on the patient's usual hypoglycemic regime   Hortencia Pilar, MD  06/01/2017 7:24 AM

## 2017-06-01 NOTE — Discharge Instructions (Signed)

## 2017-06-01 NOTE — Anesthesia Post-op Follow-up Note (Signed)
Anesthesia QCDR form completed.        

## 2017-06-01 NOTE — OR Nursing (Signed)
Discharge pending son's arrival to postop.

## 2017-06-01 NOTE — Anesthesia Procedure Notes (Signed)
Procedure Name: LMA Insertion Date/Time: 06/01/2017 7:42 AM Performed by: Eben Burow, CRNA Pre-anesthesia Checklist: Patient identified, Emergency Drugs available, Suction available, Patient being monitored and Timeout performed Patient Re-evaluated:Patient Re-evaluated prior to induction Oxygen Delivery Method: Circle system utilized Preoxygenation: Pre-oxygenation with 100% oxygen Induction Type: IV induction LMA: LMA inserted LMA Size: 4.5 Number of attempts: 1 Placement Confirmation: positive ETCO2 and breath sounds checked- equal and bilateral Tube secured with: Tape Dental Injury: Teeth and Oropharynx as per pre-operative assessment

## 2017-06-01 NOTE — Telephone Encounter (Signed)
If an EKG is not indicated they can cancel it.

## 2017-06-01 NOTE — Op Note (Signed)
OPERATIVE NOTE   PROCEDURE: 1. Left brachial axillary arteriovenous graft placement with a 4-7 mm tapered propatent graft 2.  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 male who presents with end stage renal disease.  The patient is scheduled for left brachial axillary AV graft placement.  The patient is aware the risks include but are not limited to: bleeding, infection, steal syndrome, nerve damage, ischemic monomelic neuropathy, failure to mature, and need for additional procedures.  The patient is aware of the risks of the procedure and elects to proceed forward.  DESCRIPTION: After full informed written consent was obtained from the patient, the patient was brought back to the operating room and placed supine upon the operating table.  Prior to induction, the patient received IV antibiotics.   After obtaining adequate anesthesia, the patient was then prepped and draped in the standard fashion for a left arm access procedure.    A linear incision was then created along the medial border of the biceps muscle just proximal to the antecubital crease and the brachial artery which was exposed through. The brachial artery was then looped proximally and distally with Silastic Vesseloops. Side branches were controlled with 4-0 silk ties.  Attention was then turned to the exposure of the axillary vein. Linear incision was then created  medial to the proximal portion of the biceps at the level of the anterior axillary crease. The axillary vein was exposed and again looped proximally and distally with Silastic vessel loops. Associated tributaries were also controlled with Silastic Vesseloops.  The Gore tunneler was then delivered onto the field and a subcutaneous path was made from the arterial incision to the venous incision. A 4-7 tapered PTFE propatent graft by [name] was then pulled through the subcutaneous tunnel. The arterial 4 mm portion was then approximated to the brachial artery. Brachial artery was controlled proximally and distally with the Silastic Vesseloops. Arteriotomy was made with an 11 blade scalpel and extended with Potts scissors and a 6-0 Prolene stay suture was placed. End graft to side brachial artery anastomosis was then fashioned with running CV 6 suture. Flushing maneuvers were performed suture line was hemostatic and the graft was then assessed for proper position and ease of future cannulation. Heparinized saline was infused into the vein and the graft was clamped with a vascular clamp. With the graft pressurized it was approximated to the axillary vein in its native bed and then marked with a surgical marker. The vein was then delivered into the surgical field and controlled with the Silastic vessel loops. Venotomy was then made with an 11 blade scalpel and extended with Potts scissors and a 6-0 Prolene suture was used as stay suture. The the graft was then sewn to the vein in an end graft to side vein fashion using running CV 6 suture.  Flushing maneuvers were performed and the artery was allowed to forward and back bleed.  Flow was then established through the AV graft  There was good  thrill in the venous outflow, and  there was 1+ palpable radial pulse.  At this point, I irrigated out the surgical wounds.  There was no further active bleeding.  The subcutaneous tissue was reapproximated with a running stitch of 3-0  Vicryl.  The skin was then reapproximated with a running subcuticular stitch of 4-0 Vicryl.  The skin was then cleaned, dried, and reinforced with Dermabond.  Attention was then turned to the aneurysmal fistula in the left forearm.  Quarter percent Marcaine with epinephrine was infiltrated into the surrounding soft tissues.  An elliptical incision was then made overlying the multiple lobulated aneurysms.  Dissection was carried down through the skin and subcutaneous fatty tissues to expose the aneurysms themselves.  These were then dissected circumferentially the proximal and distal ends were ligated with 2-0 silk ties and the thrombosed aneurysms were resected en bloc and passed off the field as specimen.  Hemostasis was then achieved with Bovie cautery.  The wound was then closed in layers using 3-0 Vicryl in a running fashion for the subcutaneous tissues and a 4-0 Monocryl subcuticular for skin.  The patient tolerated this procedure well.   COMPLICATIONS: None  CONDITION: [name] [name]  Office: [phone]   06/01/2017, 1:00 PM

## 2017-06-01 NOTE — Transfer of Care (Signed)
Immediate Anesthesia Transfer of Care Note  Patient: [name]  Procedure(s) Performed: INSERTION OF ARTERIOVENOUS (AV) GORE-TEX GRAFT ARM ( BRACHIAL AXILLARY ) (Left )  Patient Location: PACU  Anesthesia Type:General  Level of Consciousness: drowsy  Airway & Oxygen Therapy: Patient Spontanous Breathing and Patient connected to face mask oxygen  Post-op Assessment: Report given to RN and Post -op Vital signs reviewed and stable  Post vital signs: Reviewed and stable  Last Vitals:  Vitals Value Taken Time  BP 127/103 06/01/2017 10:07 AM  Temp    Pulse 76 06/01/2017 10:11 AM  Resp 18 06/01/2017 10:11 AM  SpO2 98 % 06/01/2017 10:11 AM  Vitals shown include unvalidated device data.  Last Pain:  Vitals:   06/01/17 0658  TempSrc: Oral  PainSc: 6          Complications: No apparent anesthesia complications

## 2017-06-02 ENCOUNTER — Encounter: Payer: Self-pay | Admitting: Vascular Surgery

## 2017-06-02 LAB — SURGICAL PATHOLOGY

## 2017-06-06 ENCOUNTER — Other Ambulatory Visit: Payer: Medicare Other

## 2017-06-08 ENCOUNTER — Telehealth (INDEPENDENT_AMBULATORY_CARE_PROVIDER_SITE_OTHER): Payer: Self-pay
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
## 2017-06-08 NOTE — Telephone Encounter (Signed)
Unfortunately, Dr. Delana Meyer would need to know about this as well as give his permission for more narcotics.

## 2017-06-09 NOTE — Telephone Encounter (Signed)
Called Carmell Austria back with [facility] and I let her know that at this time that Dr. Delana Meyer would not be able to write the patient another prescription for any type of pain medication. The patient can have OTC Tylenol/Ibupropen.  The nurse also wanted to note that she has not been able to get in touch with the patient by phone today, and that she would try again later today to let him know about the pain medication.

## 2017-06-16 ENCOUNTER — Encounter (INDEPENDENT_AMBULATORY_CARE_PROVIDER_SITE_OTHER): Payer: Self-pay | Admitting: Vascular Surgery

## 2017-06-16 ENCOUNTER — Ambulatory Visit (INDEPENDENT_AMBULATORY_CARE_PROVIDER_SITE_OTHER): Payer: Medicare Other | Admitting: Vascular Surgery

## 2017-06-16 VITALS — BP 123/69 | HR 73 | Resp 16 | Ht 68.0 in | Wt 168.4 lb

## 2017-06-16 DIAGNOSIS — T829XXS Unspecified complication of cardiac and vascular prosthetic device, implant and graft, sequela: Secondary | ICD-10-CM

## 2017-06-16 DIAGNOSIS — Z992 Dependence on renal dialysis: Secondary | ICD-10-CM

## 2017-06-16 DIAGNOSIS — N186 End stage renal disease: Secondary | ICD-10-CM

## 2017-06-16 NOTE — Progress Notes (Signed)
MRN : [id number]  Sender [name] is a 62 y.o. (03/10/1955) male who presents with chief complaint of  Chief Complaint  Patient presents with  . Follow-up    2 week [facility]   .  History of Present Illness:   Procedure performed on 06/01/2017: 1. Left brachial axillary arteriovenous graft placement with a 4-7 mm tapered propatent graft 2.  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 The function of the access has been stable.  The patient denies hand pain or other symptoms consistent with steal phenomena.  No significant arm swelling.  The patient denies redness or swelling at the access site. The patient denies fever or chills at home or while on dialysis.  The patient denies amaurosis fugax or recent TIA symptoms. There are no recent neurological changes noted. The patient denies claudication symptoms or rest pain symptoms. The patient denies history of DVT, PE or superficial thrombophlebitis. The patient denies recent episodes of angina or shortness of breath.        Current Meds  Medication Sig  . acetaminophen (TYLENOL) 325 MG tablet Take 2 tablets (650 mg total) by mouth every 6 (six) hours as needed for mild pain (or Fever >/= 101). (Patient taking differently: Take 500 mg by mouth every 6 (six) hours as needed for mild pain (or Fever >/= 101). )  . albuterol (PROVENTIL HFA;VENTOLIN HFA) 108 (90 Base) MCG/ACT inhaler Inhale 2 puffs into the lungs every 4 (four) hours as needed for wheezing or shortness of breath.   [name] amiodarone (PACERONE) 200 MG tablet Take 1 tablet (200 mg total) by mouth daily.  [name] atorvastatin (LIPITOR) 40 MG tablet Take 1 tablet (40 mg total) by mouth daily.  . calcium acetate (PHOSLO) 667 MG capsule Take 1,334 mg by mouth 3 (three) times daily with meals.   . carvedilol (COREG) 6.25 MG tablet Take 1 tablet (6.25 mg total) by mouth 2 (two) times daily with a meal.  .  cinacalcet (SENSIPAR) 90 MG tablet Take 90 mg by mouth See admin instructions. MON WED FRI - given at dialysis  . furosemide (LASIX) 40 MG tablet Take 1 tablet (40 mg total) by mouth 2 (two) times daily.  . hydrALAZINE (APRESOLINE) 50 MG tablet Take 100 mg by mouth 3 (three) times daily.   [name] ipratropium (ATROVENT HFA) 17 MCG/ACT inhaler Inhale 2 puffs into the lungs every 4 (four) hours as needed for wheezing.  . isosorbide mononitrate (IMDUR) 30 MG 24 hr tablet Take 1 tablet (30 mg total) by mouth daily. (Patient taking differently: Take 60 mg by mouth daily. )  . lidocaine-prilocaine (EMLA) cream Apply 1 application topically as needed (dialysis access).  . nitroGLYCERIN (NITROSTAT) 0.4 MG SL tablet Place 0.4 mg under the tongue every 5 (five) minutes as needed for chest pain.  [name] omeprazole (PRILOSEC) 40 MG capsule Take 40 mg by mouth daily.  [name] oxyCODONE-acetaminophen (PERCOCET) 5-325 MG tablet Take 1-2 tablets by mouth every 6 (six) hours as needed for moderate pain or severe pain.  . OXYGEN Inhale into the lungs daily as needed. Wears O2 at 2L [place] at home at night and as needed in the day  . sacubitril-valsartan (ENTRESTO) 24-26 MG Take 1 tablet by mouth 2 (two) times daily.    Past Medical History:  Diagnosis Date  . Arthritis   . CAD (coronary artery disease)    a. 09/2013 Myoview [facility]): basal inf defect  more pronounced @ rest, likely artifact, EF 48%, low risk study; b. 06/2016 Cath [facility]): LAD 10p, 30-26m, 40-50d, D1 90(small), D2 40, D3 60-70, D4 30, LCX 10p, 66m, 20d, Om1 30, OM3 40, RCA 100p w/ bridging collats, m/d RCA fill via collats, small/mod caliber-->Med Rx; c. 02/2017 MV: inf infarct w/ peri-inf ischemia. EF <30%.   . Chronic combined systolic and diastolic CHF (congestive heart failure) ([place])    a. 10/2013 Echo [facility]): EF 50%, diast dysfxn;  b. 10/2014 Echo: EF 30-35%, diff HK, gr1 DD;  c. 04/2016 Echo: EF 20-25%, sev dil LV, diff HK, gr3 DD; d. 06/2016 Echo: EF 30-35%, Gr2 DD.  .  Diabetes ([place])   . Dyspnea   . ESRD (end stage renal disease) ([place])    a. MWF dialysis @ [facility].  . Hepatitis    Patient is unsure type of Hepatitis he has  . Hypertension   . Hypertensive heart disease with CHF (congestive heart failure) ([place])   . Mixed Ischemic and non-ischemic cardiomyopathy    a. 10/2013 Echo [facility]): EF 50%, diast dysfxn;  b. 10/2014 Echo: EF 30-35%, diff HK, gr1 DD;  c. 04/2016 Echo: EF 20-25%, sev dil LV, diff HK, gr3 DD; d. 06/2016 Echo: EF 30-35%, Gr2 DD.  [name] Moderate Aortic insufficiency    a. 04/2016 Echo: Mod AI.  [name] Myocardial infarction ([place])   . Peripheral vascular disease ([place])   . Pulmonary hypertension ([place])    a. 10/2014 Echo: Mod-Sev PAH.  [name] PVC's (premature ventricular contractions)    a. 02/2017 24 hr holter: freq polymorphic PVCs w/ total of 38K beats in 24 hrs (34% burden).    Past Surgical History:  Procedure Laterality Date  . A/V FISTULAGRAM Left 04/06/2016   Procedure: A/V Fistulagram;  Surgeon: Katha Cabal, MD;  Location: [place] [facility];  Service: Cardiovascular;  Laterality: Left;  . A/V FISTULAGRAM Left 01/19/2017   Procedure: A/V FISTULAGRAM;  Surgeon: Katha Cabal, MD;  Location: [place] [facility];  Service: Cardiovascular;  Laterality: Left;  . A/V SHUNT INTERVENTION N/A 04/06/2016   Procedure: A/V Shunt Intervention;  Surgeon: Katha Cabal, MD;  Location: [place] [facility];  Service: Cardiovascular;  Laterality: N/A;  . A/V SHUNT INTERVENTION N/A 06/29/2016   Procedure: A/V Shunt Intervention;  Surgeon: Katha Cabal, MD;  Location: [place] [facility];  Service: Cardiovascular;  Laterality: N/A;  . AV FISTULA PLACEMENT Left 2014  . AV FISTULA PLACEMENT Left 06/01/2017   Procedure: INSERTION OF ARTERIOVENOUS (AV) GORE-TEX GRAFT ARM ( BRACHIAL AXILLARY );  Surgeon: Katha Cabal, MD;  Location: [facility];  Service: Vascular;  Laterality: Left;  . DIALYSIS/PERMA CATHETER INSERTION Right    Dr.  Delana Meyer  . PERIPHERAL VASCULAR CATHETERIZATION N/A 10/03/2014   Procedure: A/V Shuntogram/Fistulagram;  Surgeon: Algernon Huxley, MD;  Location: [place] [facility];  Service: Cardiovascular;  Laterality: N/A;  . PERIPHERAL VASCULAR CATHETERIZATION Left 10/03/2014   Procedure: A/V Shunt Intervention;  Surgeon: Algernon Huxley, MD;  Location: [place] [facility];  Service: Cardiovascular;  Laterality: Left;  . PERIPHERAL VASCULAR CATHETERIZATION Left 05/01/2015   Procedure: A/V Shuntogram/Fistulagram;  Surgeon: Algernon Huxley, MD;  Location: [place] [facility];  Service: Cardiovascular;  Laterality: Left;  . PERIPHERAL VASCULAR CATHETERIZATION N/A 05/01/2015   Procedure: A/V Shunt Intervention;  Surgeon: Algernon Huxley, MD;  Location: [place] [facility];  Service: Cardiovascular;  Laterality: N/A;  . UPPER EXTREMITY ANGIOGRAPHY Bilateral 06/29/2016  Procedure: Upper Extremity Angiography;  Surgeon: Katha Cabal, MD;  Location: [place] [facility];  Service: Cardiovascular;  Laterality: Bilateral;  . UPPER EXTREMITY INTERVENTION  06/29/2016   Procedure: Upper Extremity Intervention;  Surgeon: Katha Cabal, MD;  Location: [place] [facility];  Service: Cardiovascular;;  . UPPER EXTREMITY VENOGRAPHY Left 03/15/2017   Procedure: UPPER EXTREMITY VENOGRAPHY;  Surgeon: Katha Cabal, MD;  Location: [place] [facility];  Service: Cardiovascular;  Laterality: Left;    Social History Social History   Tobacco Use  . Smoking status: Former Smoker    Packs/day: 0.25    Years: 40.00    Pack years: 10.00    Types: Cigarettes    Last attempt to quit: 10/12/2016    Years since quitting: 0.6  . Smokeless tobacco: Never Used  Substance Use Topics  . Alcohol use: No    Alcohol/week: 0.0 oz  . Drug use: Yes    Types: Cocaine, Marijuana    Comment: patient states no drugs    Family History Family History  Problem Relation Age of Onset  . Hypertension Son   . Diabetes Son   . Hypertension Mother     . Diabetes Mother   . Diabetes Sister     Allergies  Allergen Reactions  . Sulfa Antibiotics Rash and Shortness Of Breath     REVIEW OF SYSTEMS (Negative unless checked)  Constitutional: [] Weight loss  [] Fever  [] Chills Cardiac: [] Chest pain   [] Chest pressure   [] Palpitations   [] Shortness of breath when laying flat   [] Shortness of breath with exertion. Vascular:  [] Pain in legs with walking   [] Pain in legs at rest  [] History of DVT   [] Phlebitis   [] Swelling in legs   [] Varicose veins   [] Non-healing ulcers Pulmonary:   [] Uses home oxygen   [] Productive cough   [] Hemoptysis   [] Wheeze  [] COPD   [] Asthma Neurologic:  [] Dizziness   [] Seizures   [] History of stroke   [] History of TIA  [] Aphasia   [] Vissual changes   [] Weakness or numbness in arm   [] Weakness or numbness in leg Musculoskeletal:   [] Joint swelling   [] Joint pain   [] Low back pain Hematologic:  [] Easy bruising  [] Easy bleeding   [] Hypercoagulable state   [] Anemic Gastrointestinal:  [] Diarrhea   [] Vomiting  [] Gastroesophageal reflux/heartburn   [] Difficulty swallowing. Genitourinary:  [x] Chronic kidney disease   [] Difficult urination  [] Frequent urination   [] Blood in urine Skin:  [] Rashes   [] Ulcers  Psychological:  [] History of anxiety   []  History of major depression.  Physical Examination  Vitals:   06/16/17 1007  BP: 123/69  Pulse: 73  Resp: 16  Weight: 168 lb 6.4 oz (76.4 kg)  Height: 5\' 8"  (1.727 m)   Body mass index is 25.61 kg/m. Gen: WD/WN, NAD Head: [place]/AT, No temporalis wasting.  Ear/Nose/Throat: Hearing grossly intact, nares w/o erythema or drainage Eyes: PER, EOMI, sclera nonicteric.  Neck: Supple, no large masses.   Pulmonary:  Good air movement, no audible wheezing bilaterally, no use of accessory muscles.  Cardiac: RRR, no JVD Vascular: left brachial axillary av graft good thrill good bruit  Forearm incsion CD&I Vessel Right Left  Radial Palpable Palpable  Ulnar Palpable Palpable   Brachial Palpable Palpable  Gastrointestinal: Non-distended. No guarding/no peritoneal signs.  Musculoskeletal: M/S 5/5 throughout.  No deformity or atrophy.  Neurologic: CN 2-12 intact. Symmetrical.  Speech is fluent. Motor exam as listed above. Psychiatric: Judgment intact, Mood & affect appropriate  for pt's clinical situation. Dermatologic: No rashes or ulcers noted.  No changes consistent with cellulitis. Lymph : No lichenification or skin changes of chronic lymphedema.  CBC Lab Results  Component Value Date   WBC 7.0 06/01/2017   HGB 12.9 (L) 06/01/2017   HCT 40.2 06/01/2017   MCV 96.2 06/01/2017   PLT 193 06/01/2017    BMET    Component Value Date/Time   NA 141 06/01/2017 0636   NA 139 04/30/2013 1203   K 4.2 06/01/2017 0636   K 4.4 04/30/2013 1203   CL 107 06/01/2017 0636   CL 107 04/30/2013 1203   CO2 25 06/01/2017 0636   CO2 25 04/30/2013 1203   GLUCOSE 82 06/01/2017 0636   GLUCOSE 104 (H) 04/30/2013 1203   BUN 27 (H) 06/01/2017 0636   BUN 76 (H) 04/30/2013 1203   CREATININE 5.60 (H) 06/01/2017 0636   CREATININE 9.49 (H) 04/30/2013 1203   CALCIUM 9.0 06/01/2017 0636   CALCIUM 8.0 (L) 04/30/2013 1203   GFRNONAA 10 (L) 06/01/2017 0636   GFRNONAA 5 (L) 04/30/2013 1203   GFRAA 11 (L) 06/01/2017 0636   GFRAA 6 (L) 04/30/2013 1203   Estimated Creatinine Clearance: 13.1 mL/min (A) (by C-G formula based on SCr of 5.6 mg/dL (H)).  COAG Lab Results  Component Value Date   INR 0.97 06/01/2017   INR 1.04 03/31/2017   INR 1.10 07/27/2016    Radiology No results found.    Assessment/Plan 1. ESRD (end stage renal disease) on dialysis [facility]) Recommend:  The patient is doing well and currently has adequate dialysis access. The patient's dialysis center is not reporting any access issues. Flow pattern is stable when compared to the prior ultrasound.  The patient should have a duplex ultrasound of the dialysis access in 6 months.  The patient will follow-up  with me in the office after each ultrasound     2. Complication from renal dialysis device, sequela Recommend:  The patient is doing well and currently has adequate dialysis access. The patient's dialysis center is not reporting any access issues. Flow pattern is stable when compared to the prior ultrasound.  The patient should have a duplex ultrasound of the dialysis access in 6 months.  The patient will follow-up with me in the office after each ultrasound   - VAS [place] (AVF, AVG); Future - VAS US DUPLEX DIALYSIS ACCESS (AVF, AVG); Future    Hortencia Pilar, MD  06/16/2017 10:24 AM

## 2017-06-27 ENCOUNTER — Ambulatory Visit: Payer: Medicare Other | Admitting: Physician Assistant

## 2017-06-29 ENCOUNTER — Encounter (INDEPENDENT_AMBULATORY_CARE_PROVIDER_SITE_OTHER): Payer: Self-pay

## 2017-07-04 ENCOUNTER — Other Ambulatory Visit (INDEPENDENT_AMBULATORY_CARE_PROVIDER_SITE_OTHER): Payer: Self-pay | Admitting: Vascular Surgery

## 2017-07-05 ENCOUNTER — Encounter
Admission: RE | Disposition: A | Discharge status: Home / Self Care | Payer: Self-pay | Source: Ambulatory Visit | Attending: Vascular Surgery

## 2017-07-05 ENCOUNTER — Ambulatory Visit
Admission: RE | Admit: 2017-07-05 | Discharge: 2017-07-05 | Disposition: A | Discharge status: Home / Self Care | Payer: Medicare Other | Source: Ambulatory Visit | Attending: Vascular Surgery | Admitting: Vascular Surgery

## 2017-07-05 DIAGNOSIS — Z4901 Encounter for fitting and adjustment of extracorporeal dialysis catheter: Secondary | ICD-10-CM | POA: Insufficient documentation

## 2017-07-05 DIAGNOSIS — N186 End stage renal disease: Secondary | ICD-10-CM | POA: Insufficient documentation

## 2017-07-05 HISTORY — PX: DIALYSIS/PERMA CATHETER REMOVAL: CATH118289

## 2017-07-05 SURGERY — DIALYSIS/PERMA CATHETER REMOVAL
Anesthesia: LOCAL

## 2017-07-05 SURGICAL SUPPLY — 2 items
FORCEPS HALSTEAD CVD 5IN STRL (INSTRUMENTS) ×2 IMPLANT
TRAY LACERAT/PLASTIC (MISCELLANEOUS) ×2 IMPLANT

## 2017-07-05 NOTE — Discharge Instructions (Signed)
Tunneled Catheter Removal, Care After °Refer to this sheet in the next few weeks. These instructions provide you with information about caring for yourself after your procedure. Your health care provider may also give you more specific instructions. Your treatment has been planned according to current medical practices, but problems sometimes occur. Call your health care provider if you have any problems or questions after your procedure. °What can I expect after the procedure? °After the procedure, it is common to have: °· Some mild redness, swelling, and pain around your catheter site. ° ° °Follow these instructions at home: °Incision care  °· Check your removal site  every day for signs of infection. Check for: °¨ More redness, swelling, or pain. °¨ More fluid or blood. °¨ Warmth. °¨ Pus or a bad smell. °· Follow instructions from your health care provider about how to take care of your removal site. Make sure you: °¨ Wash your hands with soap and water before you change your bandages (dressings). If soap and water are not available, use hand sanitizer. °Activity  °· Return to your normal activities as told by your health care provider. Ask your health care provider what activities are safe for you. °· Do not lift anything that is heavier than 10 lb (4.5 kg) for 3 weeks or as long as told by your health care provider. ° °Contact a health care provider if: °· You have more fluid or blood coming from your removal site °· You have more redness, swelling, or pain at your incisions or around the area where your catheter was removed °· Your removal site feel warm to the touch. °· You feel unusually weak. °· You feel nauseous.. °· Get help right away if °· You have swelling in your arm, shoulder, neck, or face. °· You develop chest pain. °· You have difficulty breathing. °· You feel dizzy or light-headed. °· You have pus or a bad smell coming from your removal site °· You have a fever. °· You develop bleeding from your  removal site, and your bleeding does not stop. °This information is not intended to replace advice given to you by your health care provider. Make sure you discuss any questions you have with your health care provider. °Document Released: 01/26/2012 Document Revised: 10/12/2015 Document Reviewed: 11/04/2014 °Elsevier Interactive Patient Education © 2017 [employer] ° °

## 2017-07-05 NOTE — H&P (Signed)
[facility] History & Physical Update  The patient was interviewed and re-examined.  The patient's previous History and Physical has been reviewed and is unchanged.  There is no change in the plan of care. We plan to proceed with the scheduled procedure.  Hortencia Pilar, MD  07/05/2017, 2:47 PM

## 2017-07-05 NOTE — Op Note (Signed)
  OPERATIVE NOTE   PROCEDURE: 1. Removal of a right IJ tunneled dialysis catheter  PRE-OPERATIVE DIAGNOSIS: Complication of dialysis catheter, End stage renal disease  POST-OPERATIVE DIAGNOSIS: Same  SURGEON: Hortencia Pilar, M.D.  ANESTHESIA: Local anesthetic with 1% lidocaine with epinephrine   ESTIMATED BLOOD LOSS: Minimal   FINDING(S): 1. Catheter intact   SPECIMEN(S):  Catheter  INDICATIONS:   [name] is a 62 y.o. male who presents with functioning left brachial axillary AV graft and a clotted right IJ tunneled catheter.  The patient has undergone placement of an extremity access which is working and this has been successfully cannulated without difficulty.  therefore is undergoing removal of his tunneled catheter which is no longer needed to avoid septic complications.   DESCRIPTION: After obtaining full informed written consent, the patient was positioned supine. The right IJ catheter and surrounding area is prepped and draped in a sterile fashion. The cuff was localized by palpation and noted to be less than 3 cm from the exit site. After appropriate timeout is called, 1% lidocaine with epinephrine is infiltrated into the surrounding tissues around the cuff. Small transverse incision is created at the exit site with an 11 blade scalpel and the dissection was carried up along the catheter to expose the cuff of the tunneled catheter.  The catheter cuff is then freed from the surrounding attachments and adhesions. Once the catheter has been freed circumferentially it is removed in 1 piece. Light pressure was held at the base of the neck.   Antibiotic ointment and a sterile dressing is applied to the exit site. Patient tolerated procedure well and there were no complications.  COMPLICATIONS: None  CONDITION: Unchanged  Hortencia Pilar, M.D. [facility] Office: [phone]  07/05/2017,3:30 PM

## 2017-07-06 ENCOUNTER — Encounter: Payer: Self-pay | Admitting: Vascular Surgery

## 2017-08-18 ENCOUNTER — Other Ambulatory Visit: Payer: Medicare Other

## 2017-08-18 ENCOUNTER — Ambulatory Visit: Payer: Medicare Other | Admitting: Nurse Practitioner

## 2017-08-23 ENCOUNTER — Other Ambulatory Visit: Payer: Medicare Other

## 2017-09-20 ENCOUNTER — Other Ambulatory Visit: Payer: Self-pay

## 2017-09-20 ENCOUNTER — Ambulatory Visit (INDEPENDENT_AMBULATORY_CARE_PROVIDER_SITE_OTHER): Payer: Medicare Other

## 2017-09-20 DIAGNOSIS — I5042 Chronic combined systolic (congestive) and diastolic (congestive) heart failure: Secondary | ICD-10-CM

## 2017-09-22 ENCOUNTER — Ambulatory Visit: Payer: Medicare Other | Admitting: Internal Medicine

## 2017-09-22 NOTE — Progress Notes (Deleted)
Follow-up Outpatient Visit Date: 09/22/2017  Primary Care Provider: [facility], [address] [place] [address]  Chief Complaint: ***  HPI:  [name] is a 62 y.o. 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  He was last seen in our office by Ignacia Bayley, NP, in February.  At that time, he reported mild chronic shortness of breath, somewhat improved from prior visits.  He denied chest pain and was tolerating dialysis sessions well.  PVC burden has significantly improved with addition of amiodarone, followed by Dr. Caryl Comes (EP).  --------------------------------------------------------------------------------------------------  Past Medical History:  Diagnosis Date  . Arthritis   . CAD (coronary artery disease)    a. 09/2013 Myoview [facility]): basal inf defect more pronounced @ rest, likely artifact, EF 48%, low risk study; b. 06/2016 Cath [facility]): LAD 10p, 30-18m, 40-50d, D1 90(small), D2 40, D3 60-70, D4 30, LCX 10p, 77m, 20d, Om1 30, OM3 40, RCA 100p w/ bridging collats, m/d RCA fill via collats, small/mod caliber-->Med Rx; c. 02/2017 MV: inf infarct w/ peri-inf ischemia. EF <30%.   . Chronic combined systolic and diastolic CHF (congestive heart failure) ([place])    a. 10/2013 Echo [facility]): EF 50%, diast dysfxn;  b. 10/2014 Echo: EF 30-35%, diff HK, gr1 DD;  c. 04/2016 Echo: EF 20-25%, sev dil LV, diff HK, gr3 DD; d. 06/2016 Echo: EF 30-35%, Gr2 DD.  . Diabetes ([place])   . Dyspnea   . ESRD (Brecklynn Jian stage renal disease) ([place])    a. MWF dialysis @ [facility].  . Hepatitis    Patient is unsure type of Hepatitis he has  . Hypertension   .  Hypertensive heart disease with CHF (congestive heart failure) ([place])   . Mixed Ischemic and non-ischemic cardiomyopathy    a. 10/2013 Echo [facility]): EF 50%, diast dysfxn;  b. 10/2014 Echo: EF 30-35%, diff HK, gr1 DD;  c. 04/2016 Echo: EF 20-25%, sev dil LV, diff HK, gr3 DD; d. 06/2016 Echo: EF 30-35%, Gr2 DD.  [name] Moderate Aortic insufficiency    a. 04/2016 Echo: Mod AI.  [name] Myocardial infarction ([place])   . Peripheral vascular disease ([place])   . Pulmonary hypertension ([place])    a. 10/2014 Echo: Mod-Sev PAH.  [name] PVC's (premature ventricular contractions)    a. 02/2017 24 hr holter: freq polymorphic PVCs w/ total of 38K beats in 24 hrs (34% burden).   Past Surgical History:  Procedure Laterality Date  . A/V FISTULAGRAM Left 04/06/2016   Procedure: A/V Fistulagram;  Surgeon: Katha Cabal, MD;  Location: [place] [facility];  Service: Cardiovascular;  Laterality: Left;  . A/V FISTULAGRAM Left 01/19/2017   Procedure: A/V FISTULAGRAM;  Surgeon: Katha Cabal, MD;  Location: [place] [facility];  Service: Cardiovascular;  Laterality: Left;  . A/V SHUNT INTERVENTION N/A 04/06/2016   Procedure: A/V Shunt Intervention;  Surgeon: Katha Cabal, MD;  Location: [place] [facility];  Service: Cardiovascular;  Laterality: N/A;  . A/V SHUNT INTERVENTION N/A 06/29/2016   Procedure: A/V Shunt Intervention;  Surgeon: Katha Cabal, MD;  Location: [place] [facility];  Service: Cardiovascular;  Laterality: N/A;  . AV FISTULA PLACEMENT Left 2014  . AV FISTULA PLACEMENT Left 06/01/2017   Procedure: INSERTION  OF ARTERIOVENOUS (AV) GORE-TEX GRAFT ARM ( BRACHIAL AXILLARY );  Surgeon: Katha Cabal, MD;  Location: [facility] ORS;  Service: Vascular;  Laterality: Left;  . DIALYSIS/PERMA CATHETER INSERTION Right    Dr. Delana Meyer  . DIALYSIS/PERMA CATHETER REMOVAL N/A 07/05/2017   Procedure: DIALYSIS/PERMA CATHETER REMOVAL;  Surgeon: Katha Cabal, MD;  Location: [place] [facility];  Service: Cardiovascular;   Laterality: N/A;  . PERIPHERAL VASCULAR CATHETERIZATION N/A 10/03/2014   Procedure: A/V Shuntogram/Fistulagram;  Surgeon: Algernon Huxley, MD;  Location: [place] [facility];  Service: Cardiovascular;  Laterality: N/A;  . PERIPHERAL VASCULAR CATHETERIZATION Left 10/03/2014   Procedure: A/V Shunt Intervention;  Surgeon: Algernon Huxley, MD;  Location: [place] [facility];  Service: Cardiovascular;  Laterality: Left;  . PERIPHERAL VASCULAR CATHETERIZATION Left 05/01/2015   Procedure: A/V Shuntogram/Fistulagram;  Surgeon: Algernon Huxley, MD;  Location: [place] [facility];  Service: Cardiovascular;  Laterality: Left;  . PERIPHERAL VASCULAR CATHETERIZATION N/A 05/01/2015   Procedure: A/V Shunt Intervention;  Surgeon: Algernon Huxley, MD;  Location: [place] [facility];  Service: Cardiovascular;  Laterality: N/A;  . UPPER EXTREMITY ANGIOGRAPHY Bilateral 06/29/2016   Procedure: Upper Extremity Angiography;  Surgeon: Katha Cabal, MD;  Location: [place] [facility];  Service: Cardiovascular;  Laterality: Bilateral;  . UPPER EXTREMITY INTERVENTION  06/29/2016   Procedure: Upper Extremity Intervention;  Surgeon: Katha Cabal, MD;  Location: [place] [facility];  Service: Cardiovascular;;  . UPPER EXTREMITY VENOGRAPHY Left 03/15/2017   Procedure: UPPER EXTREMITY VENOGRAPHY;  Surgeon: Katha Cabal, MD;  Location: [place] [facility];  Service: Cardiovascular;  Laterality: Left;    No outpatient medications have been marked as taking for the 09/22/17 encounter (Appointment) with Phenix Vandermeulen, Harrell Gave, MD.    Allergies: Sulfa antibiotics  Social History   Tobacco Use  . Smoking status: Former Smoker    Packs/day: 0.25    Years: 40.00    Pack years: 10.00    Types: Cigarettes    Last attempt to quit: 10/12/2016    Years since quitting: 0.9  . Smokeless tobacco: Never Used  Substance Use Topics  . Alcohol use: No    Alcohol/week: 0.0 oz  . Drug use: Yes    Types: Cocaine, Marijuana    Comment: patient  states no drugs    Family History  Problem Relation Age of Onset  . Hypertension Son   . Diabetes Son   . Hypertension Mother   . Diabetes Mother   . Diabetes Sister     Review of Systems: A 12-system review of systems was performed and was negative except as noted in the HPI.  --------------------------------------------------------------------------------------------------  Physical Exam: There were no vitals taken for this visit.  General:  *** HEENT: No conjunctival pallor or scleral icterus. Moist mucous membranes.  OP clear. Neck: Supple without lymphadenopathy, thyromegaly, JVD, or HJR. No carotid bruit. Lungs: Normal work of breathing. Clear to auscultation bilaterally without wheezes or crackles. Heart: Regular rate and rhythm without murmurs, rubs, or gallops. Non-displaced PMI. Abd: Bowel sounds present. Soft, NT/ND without hepatosplenomegaly Ext: No lower extremity edema. Radial, PT, and DP pulses are 2+ bilaterally. Skin: Warm and dry without rash.  EKG:  ***  Lab Results  Component Value Date   WBC 7.0 06/01/2017   HGB 12.9 (L) 06/01/2017   HCT 40.2 06/01/2017   MCV 96.2 06/01/2017   PLT 193 06/01/2017    Lab Results  Component Value Date   NA 141 06/01/2017  K 4.2 06/01/2017   CL 107 06/01/2017   CO2 25 06/01/2017   BUN 27 (H) 06/01/2017   CREATININE 5.60 (H) 06/01/2017   GLUCOSE 82 06/01/2017   ALT 34 03/13/2017    Lab Results  Component Value Date   CHOL 101 11/07/2012   HDL 39 (L) 11/07/2012   LDLCALC 38 11/07/2012   TRIG 119 11/07/2012    --------------------------------------------------------------------------------------------------  ASSESSMENT AND PLAN: Harrell Gave Layli Capshaw, MD 09/22/2017 7:21 AM

## 2017-09-23 ENCOUNTER — Encounter: Payer: Self-pay | Admitting: Internal Medicine

## 2017-09-27 ENCOUNTER — Telehealth: Payer: Self-pay | Admitting: Internal Medicine

## 2017-09-27 NOTE — Telephone Encounter (Signed)
-----   Message from Vanessa Ralphs, RN sent at 09/26/2017 10:18 AM EDT ----- Regarding: needs .appt Hey ya'll,  It looks like the patient no showed for last appointment. Do you mind reaching out to him to get him scheduled at next available or as soon as possible?  Thanks, [employer]

## 2017-09-27 NOTE — Telephone Encounter (Signed)
lmov to schedule with office.  Mailed Letter

## 2017-10-14 ENCOUNTER — Encounter: Payer: Self-pay | Admitting: *Deleted

## 2017-12-08 ENCOUNTER — Encounter (INDEPENDENT_AMBULATORY_CARE_PROVIDER_SITE_OTHER): Payer: Medicare Other

## 2017-12-08 ENCOUNTER — Ambulatory Visit (INDEPENDENT_AMBULATORY_CARE_PROVIDER_SITE_OTHER): Payer: Medicare Other | Admitting: Vascular Surgery

## 2018-01-30 ENCOUNTER — Encounter: Payer: Self-pay | Admitting: Emergency Medicine

## 2018-01-30 ENCOUNTER — Other Ambulatory Visit: Payer: Self-pay

## 2018-01-30 ENCOUNTER — Emergency Department
Admission: EM | Admit: 2018-01-30 | Discharge: 2018-01-30 | Disposition: A | Discharge status: Home / Self Care | Payer: Medicare Other | Attending: Emergency Medicine | Admitting: Emergency Medicine

## 2018-01-30 DIAGNOSIS — I5042 Chronic combined systolic (congestive) and diastolic (congestive) heart failure: Secondary | ICD-10-CM | POA: Diagnosis not present

## 2018-01-30 DIAGNOSIS — Z79899 Other long term (current) drug therapy: Secondary | ICD-10-CM | POA: Diagnosis not present

## 2018-01-30 DIAGNOSIS — Z87891 Personal history of nicotine dependence: Secondary | ICD-10-CM | POA: Insufficient documentation

## 2018-01-30 DIAGNOSIS — N186 End stage renal disease: Secondary | ICD-10-CM | POA: Diagnosis not present

## 2018-01-30 DIAGNOSIS — E1122 Type 2 diabetes mellitus with diabetic chronic kidney disease: Secondary | ICD-10-CM | POA: Insufficient documentation

## 2018-01-30 DIAGNOSIS — M5489 Other dorsalgia: Secondary | ICD-10-CM | POA: Diagnosis present

## 2018-01-30 DIAGNOSIS — I132 Hypertensive heart and chronic kidney disease with heart failure and with stage 5 chronic kidney disease, or end stage renal disease: Secondary | ICD-10-CM | POA: Diagnosis not present

## 2018-01-30 DIAGNOSIS — Z992 Dependence on renal dialysis: Secondary | ICD-10-CM | POA: Diagnosis not present

## 2018-01-30 DIAGNOSIS — M5442 Lumbago with sciatica, left side: Secondary | ICD-10-CM | POA: Insufficient documentation

## 2018-01-30 DIAGNOSIS — G8929 Other chronic pain: Secondary | ICD-10-CM

## 2018-01-30 LAB — GLUCOSE, CAPILLARY: Glucose-Capillary: 86 mg/dL (ref 70–99)

## 2018-01-30 MED ORDER — PREDNISONE 10 MG PO TABS: ORAL_TABLET | ORAL | 0 refills | Status: DC

## 2018-01-30 NOTE — ED Provider Notes (Addendum)
[facility] Emergency Department Provider Note   ____________________________________________   First MD Initiated Contact with Patient 01/30/18 1228     (approximate)  I have reviewed the triage vital signs and the nursing notes.   HISTORY  Chief Complaint Back Pain   HPI [name] is a 62 y.o. male presents to the ED with complaint of low back pain that radiates into his left buttocks x2 months.  Patient states he has not been seen prior to this for this complaint.  He denies any saddle anesthesias or loss of bowel or bladder control.  He states that there is no pain with sitting however there is pain with ambulation and also for standing for long periods of time.  Patient is on dialysis patient and was seen there today.  He denies any injuries to his back.  He is unaware of what control he has of his diabetes at this time as he does not check at home.  Rates his pain as an 8 out of 10.   Past Medical History:  Diagnosis Date  . Arthritis   . CAD (coronary artery disease)    a. 09/2013 Myoview [facility]): basal inf defect more pronounced @ rest, likely artifact, EF 48%, low risk study; b. 06/2016 Cath [facility]): LAD 10p, 30-31m, 40-50d, D1 90(small), D2 40, D3 60-70, D4 30, LCX 10p, 82m, 20d, Om1 30, OM3 40, RCA 100p w/ bridging collats, m/d RCA fill via collats, small/mod caliber-->Med Rx; c. 02/2017 MV: inf infarct w/ peri-inf ischemia. EF <30%.   . Chronic combined systolic and diastolic CHF (congestive heart failure) ([place])    a. 10/2013 Echo [facility]): EF 50%, diast dysfxn;  b. 10/2014 Echo: EF 30-35%, diff HK, gr1 DD;  c. 04/2016 Echo: EF 20-25%, sev dil LV, diff HK, gr3 DD; d. 06/2016 Echo: EF 30-35%, Gr2 DD.  . Diabetes ([place])   . Dyspnea   . ESRD (end stage renal disease) ([place])    a. MWF dialysis @ [facility].  . Hepatitis    Patient is unsure type of Hepatitis he has  . Hypertension   . Hypertensive heart disease with CHF (congestive heart failure) ([place])    . Mixed Ischemic and non-ischemic cardiomyopathy    a. 10/2013 Echo [facility]): EF 50%, diast dysfxn;  b. 10/2014 Echo: EF 30-35%, diff HK, gr1 DD;  c. 04/2016 Echo: EF 20-25%, sev dil LV, diff HK, gr3 DD; d. 06/2016 Echo: EF 30-35%, Gr2 DD.  [name] Moderate Aortic insufficiency    a. 04/2016 Echo: Mod AI.  [name] Myocardial infarction ([place])   . Peripheral vascular disease ([place])   . Pulmonary hypertension ([place])    a. 10/2014 Echo: Mod-Sev PAH.  [name] PVC's (premature ventricular contractions)    a. 02/2017 24 hr holter: freq polymorphic PVCs w/ total of 38K beats in 24 hrs (34% burden).    Patient Active Problem List   Diagnosis Date Noted  . Tachycardia 03/13/2017  . Acute respiratory failure with hypoxia ([place]) 02/15/2017  . Chronic systolic heart failure ([place]) 09/08/2016  . 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 Hyperkalemia 07/04/2016  . Abnormal EKG 07/04/2016  . Elevated troponin   . Chest pain 05/22/2016  . Sepsis ([place]) 02/26/2016  . HCAP (healthcare-associated pneumonia) 02/26/2016  . Diabetes ([place]) 02/26/2016  . Community acquired pneumonia 01/07/2016  . Renal dialysis device, implant, or graft complication 99/24/2683  . Tobacco use 09/04/2015  . ESRD (end stage renal disease) on dialysis ([place]) 08/02/2015  . HTN (hypertension) 08/02/2015  .  Pulmonary edema 12/02/2014    Past Surgical History:  Procedure Laterality Date  . A/V FISTULAGRAM Left 04/06/2016   Procedure: A/V Fistulagram;  Surgeon: Katha Cabal, MD;  Location: [place] [facility];  Service: Cardiovascular;  Laterality: Left;  . A/V FISTULAGRAM Left 01/19/2017   Procedure: A/V FISTULAGRAM;  Surgeon: Katha Cabal, MD;  Location: [place] [facility];  Service: Cardiovascular;  Laterality: Left;  . A/V SHUNT INTERVENTION N/A 04/06/2016   Procedure: A/V Shunt Intervention;  Surgeon: Katha Cabal, MD;  Location: [place] [facility];  Service: Cardiovascular;  Laterality: N/A;  . A/V SHUNT INTERVENTION N/A 06/29/2016    Procedure: A/V Shunt Intervention;  Surgeon: Katha Cabal, MD;  Location: [place] [facility];  Service: Cardiovascular;  Laterality: N/A;  . AV FISTULA PLACEMENT Left 2014  . AV FISTULA PLACEMENT Left 06/01/2017   Procedure: INSERTION OF ARTERIOVENOUS (AV) GORE-TEX GRAFT ARM ( BRACHIAL AXILLARY );  Surgeon: Katha Cabal, MD;  Location: [facility] ORS;  Service: Vascular;  Laterality: Left;  . DIALYSIS/PERMA CATHETER INSERTION Right    Dr. Delana Meyer  . DIALYSIS/PERMA CATHETER REMOVAL N/A 07/05/2017   Procedure: DIALYSIS/PERMA CATHETER REMOVAL;  Surgeon: Katha Cabal, MD;  Location: [place] [facility];  Service: Cardiovascular;  Laterality: N/A;  . PERIPHERAL VASCULAR CATHETERIZATION N/A 10/03/2014   Procedure: A/V Shuntogram/Fistulagram;  Surgeon: Algernon Huxley, MD;  Location: [place] [facility];  Service: Cardiovascular;  Laterality: N/A;  . PERIPHERAL VASCULAR CATHETERIZATION Left 10/03/2014   Procedure: A/V Shunt Intervention;  Surgeon: Algernon Huxley, MD;  Location: [place] [facility];  Service: Cardiovascular;  Laterality: Left;  . PERIPHERAL VASCULAR CATHETERIZATION Left 05/01/2015   Procedure: A/V Shuntogram/Fistulagram;  Surgeon: Algernon Huxley, MD;  Location: [place] [facility];  Service: Cardiovascular;  Laterality: Left;  . PERIPHERAL VASCULAR CATHETERIZATION N/A 05/01/2015   Procedure: A/V Shunt Intervention;  Surgeon: Algernon Huxley, MD;  Location: [place] [facility];  Service: Cardiovascular;  Laterality: N/A;  . UPPER EXTREMITY ANGIOGRAPHY Bilateral 06/29/2016   Procedure: Upper Extremity Angiography;  Surgeon: Katha Cabal, MD;  Location: [place] [facility];  Service: Cardiovascular;  Laterality: Bilateral;  . UPPER EXTREMITY INTERVENTION  06/29/2016   Procedure: Upper Extremity Intervention;  Surgeon: Katha Cabal, MD;  Location: [place] [facility];  Service: Cardiovascular;;  . UPPER EXTREMITY VENOGRAPHY Left 03/15/2017   Procedure: UPPER EXTREMITY VENOGRAPHY;   Surgeon: Katha Cabal, MD;  Location: [place] [facility];  Service: Cardiovascular;  Laterality: Left;    Prior to Admission medications   Medication Sig Start Date End Date Taking? Authorizing Provider  acetaminophen (TYLENOL) 325 MG tablet Take 2 tablets (650 mg total) by mouth every 6 (six) hours as needed for mild pain (or Fever >/= 101). Patient taking differently: Take 500 mg by mouth every 6 (six) hours as needed for mild pain (or Fever >/= 101).  09/21/16   Gouru, Aruna, MD  albuterol (PROVENTIL HFA;VENTOLIN HFA) 108 (90 Base) MCG/ACT inhaler Inhale 2 puffs into the lungs every 4 (four) hours as needed for wheezing or shortness of breath.     [provider]  amiodarone (PACERONE) 200 MG tablet Take 1 tablet (200 mg total) by mouth daily. 04/06/17   Theora Gianotti, NP  atorvastatin (LIPITOR) 40 MG tablet Take 1 tablet (40 mg total) by mouth daily. 02/19/17   Demetrios Loll, MD  calcium acetate (PHOSLO) 667 MG capsule Take 1,334 mg by mouth 3 (three) times daily  with meals.  12/11/15   [provider]  carvedilol (COREG) 6.25 MG tablet Take 1 tablet (6.25 mg total) by mouth 2 (two) times daily with a meal. 09/21/16   Gouru, Aruna, MD  cinacalcet (SENSIPAR) 90 MG tablet Take 90 mg by mouth See admin instructions. MON WED FRI - given at dialysis    [provider]  furosemide (LASIX) 40 MG tablet Take 1 tablet (40 mg total) by mouth 2 (two) times daily. 02/18/17   Demetrios Loll, MD  hydrALAZINE (APRESOLINE) 50 MG tablet Take 100 mg by mouth 3 (three) times daily.  07/01/16   [provider]  ipratropium (ATROVENT HFA) 17 MCG/ACT inhaler Inhale 2 puffs into the lungs every 4 (four) hours as needed for wheezing.    [provider]  isosorbide mononitrate (IMDUR) 30 MG 24 hr tablet Take 1 tablet (30 mg total) by mouth daily. Patient taking differently: Take 60 mg by mouth daily.  08/24/16 02/17/18  Dustin Flock, MD  lidocaine-prilocaine (EMLA)  cream Apply 1 application topically as needed (dialysis access).    [provider]  nitroGLYCERIN (NITROSTAT) 0.4 MG SL tablet Place 0.4 mg under the tongue every 5 (five) minutes as needed for chest pain.    [provider]  omeprazole (PRILOSEC) 40 MG capsule Take 40 mg by mouth daily. 05/11/17   [provider]  oxyCODONE-acetaminophen (PERCOCET) 5-325 MG tablet Take 1-2 tablets by mouth every 6 (six) hours as needed for moderate pain or severe pain. 06/01/17 06/01/18  Schnier, Dolores Lory, MD  OXYGEN Inhale into the lungs daily as needed. Wears O2 at 2L [place] at home at night and as needed in the day    [provider]  predniSONE (DELTASONE) 10 MG tablet Take 3 tablets once a day for 4 days 01/30/18   Johnn Hai, PA-C  sacubitril-valsartan (ENTRESTO) 24-26 MG Take 1 tablet by mouth 2 (two) times daily. 03/16/17   Theora Gianotti, NP    Allergies Sulfa antibiotics  Family History  Problem Relation Age of Onset  . Hypertension Son   . Diabetes Son   . Hypertension Mother   . Diabetes Mother   . Diabetes Sister     Social History Social History   Tobacco Use  . Smoking status: Former Smoker    Packs/day: 0.25    Years: 40.00    Pack years: 10.00    Types: Cigarettes    Last attempt to quit: 10/12/2016    Years since quitting: 1.3  . Smokeless tobacco: Never Used  Substance Use Topics  . Alcohol use: No    Alcohol/week: 0.0 standard drinks  . Drug use: Yes    Types: Cocaine, Marijuana    Comment: patient states no drugs    Review of Systems Constitutional: No fever/chills Cardiovascular: Denies chest pain. Respiratory: Denies shortness of breath. Gastrointestinal: No abdominal pain.  No nausea, no vomiting.  No diarrhea.  Genitourinary: Negative for dysuria.  Negative for history kidney stones. Musculoskeletal: Positive for low back pain with radiation into his left buttocks.  Positive for history of low back pain. Skin:  Negative for rash. Neurological: Negative for headaches, focal weakness or numbness. ____________________________________________   PHYSICAL EXAM:  VITAL SIGNS: ED Triage Vitals  Enc Vitals Group     BP 01/30/18 1135 132/65     Pulse Rate 01/30/18 1135 78     Resp 01/30/18 1135 16     Temp 01/30/18 1135 98.3 F (36.8 C)  Temp Source 01/30/18 1135 Oral     SpO2 01/30/18 1135 94 %     Weight 01/30/18 1132 173 lb (78.5 kg)     Height 01/30/18 1132 5\' 8"  (1.727 m)     Head Circumference --      Peak Flow --      Pain Score 01/30/18 1131 8     Pain Loc --      Pain Edu? --      Excl. in [place]? --    Constitutional: Alert and oriented. Well appearing and in no acute distress. Eyes: Conjunctivae are normal.  Head: Atraumatic. Neck: No stridor.   Cardiovascular: Normal rate, regular rhythm. Grossly normal heart sounds.  Good peripheral circulation. Respiratory: Normal respiratory effort.  No retractions. Lungs CTAB. Gastrointestinal: Soft and nontender. No distention.  Musculoskeletal: On examination of the back there is no gross deformity however there is tenderness on palpation of the left paravertebral muscles at approximately L5-S1 and SI joint area and surrounding tissue.  Range of motion is slow and guarded secondary to discomfort.  Patient is able to ambulate without any assistance.  Straight leg raises were negative.  Muscle strength bilaterally. Neurologic:  Normal speech and language. No gross focal neurologic deficits are appreciated.  Flexors are 2+ bilaterally.  No gait instability. Skin:  Skin is warm, dry and intact. No rash noted. Psychiatric: Mood and affect are normal. Speech and behavior are normal.  ____________________________________________   LABS (all labs ordered are listed, but only abnormal results are displayed)  Labs Reviewed  GLUCOSE, CAPILLARY    PROCEDURES  Procedure(s) performed: None  Procedures  Critical Care performed:  No  ____________________________________________   INITIAL IMPRESSION / ASSESSMENT AND PLAN / ED COURSE  As part of my medical decision making, I reviewed the following data within the electronic MEDICAL RECORD NUMBER Notes from prior ED visits and [place] Controlled Substance Database  Patient presents to the ED with complaint of low back pain with radiation into his left buttocks for 2 months.  Patient has not been seen by anyone prior to this visit.  He has continued to ambulate without any assistance.  Patient denies any recent injury to his back and there is been no incontinence of bowel or bladder.  Patient was placed on prednisone.  He is to follow-up with his PCP if any continued problems.  He is also given the name of an orthopedist to follow-up with for his chronic low back pain.  ____________________________________________   FINAL CLINICAL IMPRESSION(S) / ED DIAGNOSES  Final diagnoses:  Chronic left-sided low back pain with left-sided sciatica     ED Discharge Orders         Ordered    predniSONE (DELTASONE) 10 MG tablet     01/30/18 1301           Note:  This document was prepared using Dragon voice recognition software and may include unintentional dictation errors.    Johnn Hai, PA-C 01/30/18 1642    Johnn Hai, PA-C 01/30/18 1643    Harvest Dark, MD 01/31/18 2356

## 2018-01-30 NOTE — Discharge Instructions (Addendum)
Follow-up with your primary care provider if any continued problems or [facility] acute care if needed.  Begin taking medication as directed.  This medication may cause an increase in your blood sugar however today in the ED your blood sugar was 86.  You may use heat or ice to your back as needed for discomfort.  While taking prednisone be mindful of your diet and avoid lots of carbohydrates or sugars as this will also increase her blood sugar.

## 2018-01-30 NOTE — ED Notes (Signed)
Pt reports lower back pain radiating into left buttocks - pain has been present for 1 month - he c/o difficulty with ambulation and standing - denies pain with sitting or position change Pt reports that last pm the pain increased and then while at dialysis this am he decided to come to ED for eval

## 2018-01-30 NOTE — ED Triage Notes (Signed)
Here for left lower back pain down left buttocks X 2 month. Has not been seen for it yet. No loss bowel or bladder. No pain when sitting. Pain with ambulation. Not here for anything but his back pain with movement, verified X 3. No fever. Completed his dialysis today.

## 2018-02-07 ENCOUNTER — Emergency Department
Admission: EM | Admit: 2018-02-07 | Discharge: 2018-02-08 | Discharge status: Against Medical Advice | Payer: Medicare Other | Attending: Nephrology | Admitting: Nephrology

## 2018-02-07 ENCOUNTER — Emergency Department: Payer: Medicare Other

## 2018-02-07 ENCOUNTER — Encounter: Payer: Self-pay | Admitting: Emergency Medicine

## 2018-02-07 DIAGNOSIS — M1990 Unspecified osteoarthritis, unspecified site: Secondary | ICD-10-CM | POA: Insufficient documentation

## 2018-02-07 DIAGNOSIS — I132 Hypertensive heart and chronic kidney disease with heart failure and with stage 5 chronic kidney disease, or end stage renal disease: Secondary | ICD-10-CM | POA: Insufficient documentation

## 2018-02-07 DIAGNOSIS — E875 Hyperkalemia: Secondary | ICD-10-CM | POA: Diagnosis present

## 2018-02-07 DIAGNOSIS — N186 End stage renal disease: Secondary | ICD-10-CM | POA: Diagnosis not present

## 2018-02-07 DIAGNOSIS — I2510 Atherosclerotic heart disease of native coronary artery without angina pectoris: Secondary | ICD-10-CM | POA: Insufficient documentation

## 2018-02-07 DIAGNOSIS — I5042 Chronic combined systolic (congestive) and diastolic (congestive) heart failure: Secondary | ICD-10-CM | POA: Diagnosis not present

## 2018-02-07 DIAGNOSIS — I272 Pulmonary hypertension, unspecified: Secondary | ICD-10-CM | POA: Diagnosis not present

## 2018-02-07 DIAGNOSIS — I214 Non-ST elevation (NSTEMI) myocardial infarction: Secondary | ICD-10-CM

## 2018-02-07 DIAGNOSIS — N2581 Secondary hyperparathyroidism of renal origin: Secondary | ICD-10-CM | POA: Diagnosis not present

## 2018-02-07 DIAGNOSIS — Z794 Long term (current) use of insulin: Secondary | ICD-10-CM | POA: Diagnosis not present

## 2018-02-07 DIAGNOSIS — D631 Anemia in chronic kidney disease: Secondary | ICD-10-CM | POA: Insufficient documentation

## 2018-02-07 DIAGNOSIS — E1151 Type 2 diabetes mellitus with diabetic peripheral angiopathy without gangrene: Secondary | ICD-10-CM | POA: Diagnosis not present

## 2018-02-07 DIAGNOSIS — Z992 Dependence on renal dialysis: Secondary | ICD-10-CM | POA: Insufficient documentation

## 2018-02-07 DIAGNOSIS — I5022 Chronic systolic (congestive) heart failure: Secondary | ICD-10-CM | POA: Diagnosis present

## 2018-02-07 DIAGNOSIS — R079 Chest pain, unspecified: Secondary | ICD-10-CM | POA: Diagnosis not present

## 2018-02-07 DIAGNOSIS — I248 Other forms of acute ischemic heart disease: Secondary | ICD-10-CM | POA: Diagnosis not present

## 2018-02-07 DIAGNOSIS — I252 Old myocardial infarction: Secondary | ICD-10-CM | POA: Diagnosis not present

## 2018-02-07 DIAGNOSIS — F1410 Cocaine abuse, uncomplicated: Secondary | ICD-10-CM | POA: Insufficient documentation

## 2018-02-07 DIAGNOSIS — I10 Essential (primary) hypertension: Secondary | ICD-10-CM | POA: Diagnosis present

## 2018-02-07 DIAGNOSIS — E119 Type 2 diabetes mellitus without complications: Secondary | ICD-10-CM

## 2018-02-07 DIAGNOSIS — E1122 Type 2 diabetes mellitus with diabetic chronic kidney disease: Secondary | ICD-10-CM | POA: Insufficient documentation

## 2018-02-07 DIAGNOSIS — I255 Ischemic cardiomyopathy: Secondary | ICD-10-CM | POA: Diagnosis not present

## 2018-02-07 DIAGNOSIS — Z882 Allergy status to sulfonamides status: Secondary | ICD-10-CM | POA: Insufficient documentation

## 2018-02-07 DIAGNOSIS — Z87891 Personal history of nicotine dependence: Secondary | ICD-10-CM | POA: Insufficient documentation

## 2018-02-07 DIAGNOSIS — J811 Chronic pulmonary edema: Secondary | ICD-10-CM | POA: Diagnosis not present

## 2018-02-07 MED ORDER — IPRATROPIUM-ALBUTEROL 0.5-2.5 (3) MG/3ML IN SOLN
3.0000 mL | Freq: Once | RESPIRATORY_TRACT | Status: AC
  Administered 2018-02-07: 3 mL via RESPIRATORY_TRACT
  Filled 2018-02-07: qty 3

## 2018-02-07 NOTE — ED Provider Notes (Signed)
[facility] Emergency Department Provider Note __________   First MD Initiated Contact with Patient 02/07/18 2322     (approximate)  I have reviewed the triage vital signs and the nursing notes.   HISTORY  Chief Complaint Shortness of Breath and Chest Pain   HPI [name] is a 62 y.o. male with below list of chronic medical conditions including end-stage renal disease with dialysis Monday Wednesday Friday, myocardial infarction and pulmonary hypertension presents to the emergency department via EMS with progressive dyspnea cough and subjective fevers over the course of today.  Patient denies any lower extremity pain or swelling.  Patient also admits to central midsternal chest pain that aches currently 7 out of 10.  Patient's oxygen saturation 89% on room air on arrival to the room.  Patient does admit to crack cocaine use stating that he last used cocaine 3 days ago.   Past Medical History:  Diagnosis Date  . Arthritis   . CAD (coronary artery disease)    a. 09/2013 Myoview [facility]): basal inf defect more pronounced @ rest, likely artifact, EF 48%, low risk study; b. 06/2016 Cath [facility]): LAD 10p, 30-71m, 40-50d, D1 90(small), D2 40, D3 60-70, D4 30, LCX 10p, 41m, 20d, Om1 30, OM3 40, RCA 100p w/ bridging collats, m/d RCA fill via collats, small/mod caliber-->Med Rx; c. 02/2017 MV: inf infarct w/ peri-inf ischemia. EF <30%.   . Chronic combined systolic and diastolic CHF (congestive heart failure) ([place])    a. 10/2013 Echo [facility]): EF 50%, diast dysfxn;  b. 10/2014 Echo: EF 30-35%, diff HK, gr1 DD;  c. 04/2016 Echo: EF 20-25%, sev dil LV, diff HK, gr3 DD; d. 06/2016 Echo: EF 30-35%, Gr2 DD.  . Diabetes ([place])   . Dyspnea   . ESRD (end stage renal disease) ([place])    a. MWF dialysis @ [facility].  . Hepatitis    Patient is unsure type of Hepatitis he has  . Hypertension   . Hypertensive heart disease with CHF (congestive heart failure) ([place])   . Mixed Ischemic and  non-ischemic cardiomyopathy    a. 10/2013 Echo [facility]): EF 50%, diast dysfxn;  b. 10/2014 Echo: EF 30-35%, diff HK, gr1 DD;  c. 04/2016 Echo: EF 20-25%, sev dil LV, diff HK, gr3 DD; d. 06/2016 Echo: EF 30-35%, Gr2 DD.  [name] Moderate Aortic insufficiency    a. 04/2016 Echo: Mod AI.  [name] Myocardial infarction ([place])   . Peripheral vascular disease ([place])   . Pulmonary hypertension ([place])    a. 10/2014 Echo: Mod-Sev PAH.  [name] PVC's (premature ventricular contractions)    a. 02/2017 24 hr holter: freq polymorphic PVCs w/ total of 38K beats in 24 hrs (34% burden).    Patient Active Problem List   Diagnosis Date Noted  . Tachycardia 03/13/2017  . Acute respiratory failure with hypoxia ([place]) 02/15/2017  . Chronic systolic heart failure ([place]) 09/08/2016  . 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 Hyperkalemia 07/04/2016  . Abnormal EKG 07/04/2016  . Elevated troponin   . Chest pain 05/22/2016  . Sepsis ([place]) 02/26/2016  . HCAP (healthcare-associated pneumonia) 02/26/2016  . Diabetes ([place]) 02/26/2016  . Community acquired pneumonia 01/07/2016  . Renal dialysis device, implant, or graft complication 41/28/7867  . Tobacco use 09/04/2015  . ESRD (end stage renal disease) on dialysis ([place]) 08/02/2015  . HTN (hypertension) 08/02/2015  . Pulmonary edema 12/02/2014    Past Surgical History:  Procedure Laterality Date  . A/V FISTULAGRAM Left 04/06/2016   Procedure:  A/V Fistulagram;  Surgeon: Katha Cabal, MD;  Location: [place] [facility];  Service: Cardiovascular;  Laterality: Left;  . A/V FISTULAGRAM Left 01/19/2017   Procedure: A/V FISTULAGRAM;  Surgeon: Katha Cabal, MD;  Location: [place] [facility];  Service: Cardiovascular;  Laterality: Left;  . A/V SHUNT INTERVENTION N/A 04/06/2016   Procedure: A/V Shunt Intervention;  Surgeon: Katha Cabal, MD;  Location: [place] [facility];  Service: Cardiovascular;  Laterality: N/A;  . A/V SHUNT INTERVENTION N/A 06/29/2016   Procedure: A/V Shunt  Intervention;  Surgeon: Katha Cabal, MD;  Location: [place] [facility];  Service: Cardiovascular;  Laterality: N/A;  . AV FISTULA PLACEMENT Left 2014  . AV FISTULA PLACEMENT Left 06/01/2017   Procedure: INSERTION OF ARTERIOVENOUS (AV) GORE-TEX GRAFT ARM ( BRACHIAL AXILLARY );  Surgeon: Katha Cabal, MD;  Location: [facility] ORS;  Service: Vascular;  Laterality: Left;  . DIALYSIS/PERMA CATHETER INSERTION Right    Dr. Delana Meyer  . DIALYSIS/PERMA CATHETER REMOVAL N/A 07/05/2017   Procedure: DIALYSIS/PERMA CATHETER REMOVAL;  Surgeon: Katha Cabal, MD;  Location: [place] [facility];  Service: Cardiovascular;  Laterality: N/A;  . PERIPHERAL VASCULAR CATHETERIZATION N/A 10/03/2014   Procedure: A/V Shuntogram/Fistulagram;  Surgeon: Algernon Huxley, MD;  Location: [place] [facility];  Service: Cardiovascular;  Laterality: N/A;  . PERIPHERAL VASCULAR CATHETERIZATION Left 10/03/2014   Procedure: A/V Shunt Intervention;  Surgeon: Algernon Huxley, MD;  Location: [place] [facility];  Service: Cardiovascular;  Laterality: Left;  . PERIPHERAL VASCULAR CATHETERIZATION Left 05/01/2015   Procedure: A/V Shuntogram/Fistulagram;  Surgeon: Algernon Huxley, MD;  Location: [place] [facility];  Service: Cardiovascular;  Laterality: Left;  . PERIPHERAL VASCULAR CATHETERIZATION N/A 05/01/2015   Procedure: A/V Shunt Intervention;  Surgeon: Algernon Huxley, MD;  Location: [place] [facility];  Service: Cardiovascular;  Laterality: N/A;  . UPPER EXTREMITY ANGIOGRAPHY Bilateral 06/29/2016   Procedure: Upper Extremity Angiography;  Surgeon: Katha Cabal, MD;  Location: [place] [facility];  Service: Cardiovascular;  Laterality: Bilateral;  . UPPER EXTREMITY INTERVENTION  06/29/2016   Procedure: Upper Extremity Intervention;  Surgeon: Katha Cabal, MD;  Location: [place] [facility];  Service: Cardiovascular;;  . UPPER EXTREMITY VENOGRAPHY Left 03/15/2017   Procedure: UPPER EXTREMITY VENOGRAPHY;  Surgeon: Katha Cabal, MD;  Location: [place] [facility];  Service: Cardiovascular;  Laterality: Left;    Prior to Admission medications   Medication Sig Start Date End Date Taking? Authorizing Provider  acetaminophen (TYLENOL) 325 MG tablet Take 2 tablets (650 mg total) by mouth every 6 (six) hours as needed for mild pain (or Fever >/= 101). Patient taking differently: Take 500 mg by mouth every 6 (six) hours as needed for mild pain (or Fever >/= 101).  09/21/16   Gouru, Aruna, MD  albuterol (PROVENTIL HFA;VENTOLIN HFA) 108 (90 Base) MCG/ACT inhaler Inhale 2 puffs into the lungs every 4 (four) hours as needed for wheezing or shortness of breath.     [provider]  amiodarone (PACERONE) 200 MG tablet Take 1 tablet (200 mg total) by mouth daily. 04/06/17   Theora Gianotti, NP  atorvastatin (LIPITOR) 40 MG tablet Take 1 tablet (40 mg total) by mouth daily. 02/19/17   Demetrios Loll, MD  calcium acetate (PHOSLO) 667 MG capsule Take 1,334 mg by mouth 3 (three) times daily with meals.  12/11/15   [provider]  carvedilol (COREG) 6.25 MG tablet Take 1 tablet (6.25 mg total) by  mouth 2 (two) times daily with a meal. 09/21/16   Gouru, Aruna, MD  cinacalcet (SENSIPAR) 90 MG tablet Take 90 mg by mouth See admin instructions. MON WED FRI - given at dialysis    [provider]  furosemide (LASIX) 40 MG tablet Take 1 tablet (40 mg total) by mouth 2 (two) times daily. 02/18/17   Demetrios Loll, MD  hydrALAZINE (APRESOLINE) 50 MG tablet Take 100 mg by mouth 3 (three) times daily.  07/01/16   [provider]  ipratropium (ATROVENT HFA) 17 MCG/ACT inhaler Inhale 2 puffs into the lungs every 4 (four) hours as needed for wheezing.    [provider]  isosorbide mononitrate (IMDUR) 30 MG 24 hr tablet Take 1 tablet (30 mg total) by mouth daily. Patient taking differently: Take 60 mg by mouth daily.  08/24/16 02/17/18  Dustin Flock, MD  lidocaine-prilocaine (EMLA) cream Apply 1  application topically as needed (dialysis access).    [provider]  nitroGLYCERIN (NITROSTAT) 0.4 MG SL tablet Place 0.4 mg under the tongue every 5 (five) minutes as needed for chest pain.    [provider]  omeprazole (PRILOSEC) 40 MG capsule Take 40 mg by mouth daily. 05/11/17   [provider]  oxyCODONE-acetaminophen (PERCOCET) 5-325 MG tablet Take 1-2 tablets by mouth every 6 (six) hours as needed for moderate pain or severe pain. 06/01/17 06/01/18  Schnier, Dolores Lory, MD  OXYGEN Inhale into the lungs daily as needed. Wears O2 at 2L [place] at home at night and as needed in the day    [provider]  predniSONE (DELTASONE) 10 MG tablet Take 3 tablets once a day for 4 days 01/30/18   Johnn Hai, PA-C  sacubitril-valsartan (ENTRESTO) 24-26 MG Take 1 tablet by mouth 2 (two) times daily. 03/16/17   Theora Gianotti, NP    Allergies Sulfa antibiotics  Family History  Problem Relation Age of Onset  . Hypertension Son   . Diabetes Son   . Hypertension Mother   . Diabetes Mother   . Diabetes Sister     Social History Social History   Tobacco Use  . Smoking status: Former Smoker    Packs/day: 0.25    Years: 40.00    Pack years: 10.00    Types: Cigarettes    Last attempt to quit: 10/12/2016    Years since quitting: 1.3  . Smokeless tobacco: Never Used  Substance Use Topics  . Alcohol use: No    Alcohol/week: 0.0 standard drinks  . Drug use: Yes    Types: Cocaine, Marijuana    Comment: patient states no drugs    Review of Systems Constitutional: No fever/chills Eyes: No visual changes. ENT: No sore throat.  Positive for nasal congestion Cardiovascular: Positive for chest pain. Respiratory: Positive for cough and dyspnea Gastrointestinal: No abdominal pain.  No nausea, no vomiting.  No diarrhea.  No constipation. Genitourinary: Negative for dysuria. Musculoskeletal: Negative for neck pain.  Negative for back pain. Integumentary:  Negative for rash. Neurological: Negative for headaches, focal weakness or numbness.   ____________________________________________   PHYSICAL EXAM:  VITAL SIGNS: ED Triage Vitals  Enc Vitals Group     BP      Pulse      Resp      Temp      Temp src      SpO2      Weight      Height      Head Circumference  Peak Flow      Pain Score      Pain Loc      Pain Edu?      Excl. in [place]?     Constitutional: Alert and oriented. Well appearing and in no acute distress. Eyes: Conjunctivae are normal. Mouth/Throat: Mucous membranes are moist. Oropharynx non-erythematous. Neck: No stridor.  Cardiovascular: Normal rate, regular rhythm. Good peripheral circulation. Grossly normal heart sounds. Respiratory: Normal respiratory effort.  No retractions. Lungs CTAB. Gastrointestinal: Soft and nontender. No distention.  Musculoskeletal: No lower extremity tenderness nor edema. No gross deformities of extremities. Neurologic:  Normal speech and language. No gross focal neurologic deficits are appreciated.  Skin:  Skin is warm, dry and intact. No rash noted. Psychiatric: Mood and affect are normal. Speech and behavior are normal.  ____________________________________________   LABS (all labs ordered are listed, but only abnormal results are displayed)  Labs Reviewed  BASIC METABOLIC PANEL - Abnormal; Notable for the following components:      Result Value   Potassium 6.3 (*)    CO2 19 (*)    Glucose, Bld 112 (*)    BUN 48 (*)    Creatinine, Ser 7.08 (*)    Calcium 8.0 (*)    GFR calc non Af Amer 7 (*)    GFR calc Af Amer 9 (*)    All other components within normal limits  CBC - Abnormal; Notable for the following components:   RDW 17.0 (*)    Platelets 110 (*)    All other components within normal limits  TROPONIN I - Abnormal; Notable for the following components:   Troponin I 0.12 (*)    All other components within normal limits  CBC - Abnormal; Notable for the following  components:   RDW 16.9 (*)    Platelets 105 (*)    All other components within normal limits  CREATININE, SERUM - Abnormal; Notable for the following components:   Creatinine, Ser 7.35 (*)    GFR calc non Af Amer 7 (*)    GFR calc Af Amer 8 (*)    All other components within normal limits  TROPONIN I - Abnormal; Notable for the following components:   Troponin I 0.10 (*)    All other components within normal limits  PHOSPHORUS - Abnormal; Notable for the following components:   Phosphorus 6.2 (*)    All other components within normal limits  POCT I-STAT, CHEM 8 - Abnormal; Notable for the following components:   Potassium 5.7 (*)    BUN 46 (*)    Creatinine, Ser 7.80 (*)    Glucose, Bld 115 (*)    Calcium, Ion 0.95 (*)    All other components within normal limits  INFLUENZA PANEL BY PCR (TYPE A & B)  HIV ANTIBODY (ROUTINE TESTING W REFLEX)  TROPONIN I  TROPONIN I  BASIC METABOLIC PANEL  CBC  I-STAT CHEM 8, ED   ____________________________________________  EKG  ED ECG REPORT I, [place] N , the attending physician, personally viewed and interpreted this ECG.   Date: 02/07/2018  EKG Time: 11:20 PM  Rate: 80  Rhythm: Normal sinus rhythm  Axis: Normal  Intervals: Normal  ST&T Change: None  ____________________________________________  RADIOLOGY I, [place] N , personally viewed and evaluated these images (plain radiographs) as part of my medical decision making, as well as reviewing the written report by the radiologist.  ED MD interpretation: Cardiomegaly with aortic atherosclerosis mild central vascular congestion per radiologist on chest x-ray  interpretation  Official radiology report(s): Dg Chest Portable 1 View  Result Date: 02/07/2018 CLINICAL DATA:  Midsternal chest pain with dyspnea, cough and congestion since this morning. EXAM: PORTABLE CHEST 1 VIEW COMPARISON:  03/13/2017 FINDINGS: Stable cardiomegaly with moderate aortic atherosclerosis.  Vascular stent projects over the superior mediastinum and left upper lobe. Central vascular congestion is noted. No alveolar consolidation, effusion or pneumothorax. IMPRESSION: Cardiomegaly with aortic atherosclerosis. Mild central vascular congestion. Electronically Signed   By: Ashley Royalty M.D.   On: 02/07/2018 23:41     Procedures   ____________________________________________   INITIAL IMPRESSION / ASSESSMENT AND PLAN / ED COURSE  As part of my medical decision making, I reviewed the following data within the electronic MEDICAL RECORD NUMBER 62 year old male presenting with above-stated history and physical exam raising concern for possible CAD, CHF COPD.  EKG revealed no evidence of ischemia or infarction laboratory data notable for elevated troponin.  Patient discussed with Dr. Jannifer Franklin for hospital admission further evaluation management for NSTEMI ____________________________________________  FINAL CLINICAL IMPRESSION(S) / ED DIAGNOSES  Final diagnoses:  Chest pain, unspecified type  NSTEMI (non-ST elevated myocardial infarction) ([place])  Cocaine abuse ([place])     MEDICATIONS GIVEN DURING THIS VISIT:  Medications  calcium acetate (PHOSLO) capsule 1,334 mg (has no administration in time range)  pantoprazole (PROTONIX) EC tablet 40 mg (has no administration in time range)  cinacalcet (SENSIPAR) tablet 90 mg (has no administration in time range)  atorvastatin (LIPITOR) tablet 40 mg (has no administration in time range)  enoxaparin (LOVENOX) injection 40 mg (40 mg Subcutaneous Refused 02/08/18 0237)  acetaminophen (TYLENOL) tablet 650 mg (has no administration in time range)    Or  acetaminophen (TYLENOL) suppository 650 mg (has no administration in time range)  ondansetron (ZOFRAN) tablet 4 mg (has no administration in time range)    Or  ondansetron (ZOFRAN) injection 4 mg (has no administration in time range)  insulin aspart (novoLOG) injection 0-9 Units (has no administration in  time range)  insulin aspart (novoLOG) injection 0-5 Units (has no administration in time range)  ipratropium-albuterol (DUONEB) 0.5-2.5 (3) MG/3ML nebulizer solution 3 mL (3 mLs Nebulization Given 02/07/18 2345)  aspirin chewable tablet 324 mg (324 mg Oral Given 02/08/18 0033)  calcium chloride injection 1 g (1 g Intravenous Given 02/08/18 0034)  ondansetron (ZOFRAN) injection 4 mg (4 mg Intravenous Given 02/08/18 0040)  patiromer Daryll Drown) packet 8.4 g (8.4 g Oral Given 02/08/18 0220)     ED Discharge Orders    None       Note:  This document was prepared using Dragon voice recognition software and may include unintentional dictation errors.    Gregor Hams, MD 02/08/18 [phone]

## 2018-02-07 NOTE — ED Triage Notes (Signed)
Pt arrived via EMS from home with mid sternal chest pain with SOB, cough and congestion since this AM. Dialysis pt M,W,F. Pt ambulatory to bed from stretcher. 89% on RA, 2L of O2 applied with 100%.

## 2018-02-08 ENCOUNTER — Other Ambulatory Visit: Payer: Self-pay

## 2018-02-08 DIAGNOSIS — F1410 Cocaine abuse, uncomplicated: Secondary | ICD-10-CM | POA: Diagnosis not present

## 2018-02-08 DIAGNOSIS — I10 Essential (primary) hypertension: Secondary | ICD-10-CM

## 2018-02-08 DIAGNOSIS — E875 Hyperkalemia: Secondary | ICD-10-CM

## 2018-02-08 DIAGNOSIS — I420 Dilated cardiomyopathy: Secondary | ICD-10-CM

## 2018-02-08 DIAGNOSIS — N186 End stage renal disease: Secondary | ICD-10-CM

## 2018-02-08 DIAGNOSIS — J811 Chronic pulmonary edema: Secondary | ICD-10-CM | POA: Diagnosis not present

## 2018-02-08 DIAGNOSIS — Z992 Dependence on renal dialysis: Secondary | ICD-10-CM

## 2018-02-08 DIAGNOSIS — I248 Other forms of acute ischemic heart disease: Secondary | ICD-10-CM

## 2018-02-08 DIAGNOSIS — I493 Ventricular premature depolarization: Secondary | ICD-10-CM

## 2018-02-08 DIAGNOSIS — R079 Chest pain, unspecified: Secondary | ICD-10-CM

## 2018-02-08 LAB — BASIC METABOLIC PANEL
ANION GAP: 13 (ref 5–15)
Anion gap: 13 (ref 5–15)
BUN: 48 mg/dL — ABNORMAL HIGH (ref 8–23)
BUN: 56 mg/dL — ABNORMAL HIGH (ref 8–23)
CALCIUM: 8 mg/dL — AB (ref 8.9–10.3)
CO2: 19 mmol/L — ABNORMAL LOW (ref 22–32)
CO2: 21 mmol/L — AB (ref 22–32)
Calcium: 8.2 mg/dL — ABNORMAL LOW (ref 8.9–10.3)
Chloride: 105 mmol/L (ref 98–111)
Chloride: 101 mmol/L (ref 98–111)
Creatinine, Ser: 7.08 mg/dL — ABNORMAL HIGH (ref 0.61–1.24)
Creatinine, Ser: 7.44 mg/dL — ABNORMAL HIGH (ref 0.61–1.24)
GFR calc Af Amer: 9 mL/min — ABNORMAL LOW (ref >60)
GFR calc non Af Amer: 7 mL/min — ABNORMAL LOW (ref >60)
GFR calc non Af Amer: 7 mL/min — ABNORMAL LOW (ref >60)
GFR, EST AFRICAN AMERICAN: 8 mL/min — AB (ref >60)
GLUCOSE: 112 mg/dL — AB (ref 70–99)
Glucose, Bld: 116 mg/dL — ABNORMAL HIGH (ref 70–99)
POTASSIUM: 6.3 mmol/L — AB (ref 3.5–5.1)
Potassium: 5.4 mmol/L — ABNORMAL HIGH (ref 3.5–5.1)
SODIUM: 135 mmol/L (ref 135–145)
Sodium: 137 mmol/L (ref 135–145)

## 2018-02-08 LAB — CBC
HCT: 42.9 % (ref 39.0–52.0)
HCT: 42.5 % (ref 39.0–52.0)
HCT: 44.4 % (ref 39.0–52.0)
HEMOGLOBIN: 13.8 g/dL (ref 13.0–17.0)
Hemoglobin: 13.6 g/dL (ref 13.0–17.0)
Hemoglobin: 14.1 g/dL (ref 13.0–17.0)
MCH: 30.1 pg (ref 26.0–34.0)
MCH: 29.8 pg (ref 26.0–34.0)
MCH: 30.3 pg (ref 26.0–34.0)
MCHC: 32.2 g/dL (ref 30.0–36.0)
MCHC: 32 g/dL (ref 30.0–36.0)
MCHC: 31.8 g/dL (ref 30.0–36.0)
MCV: 93.5 fL (ref 80.0–100.0)
MCV: 93.2 fL (ref 80.0–100.0)
MCV: 95.3 fL (ref 80.0–100.0)
NRBC: 0 % (ref 0.0–0.2)
NRBC: 0 % (ref 0.0–0.2)
NRBC: 0 % (ref 0.0–0.2)
PLATELETS: 110 10*3/uL — AB (ref 150–400)
Platelets: 105 10*3/uL — ABNORMAL LOW (ref 150–400)
Platelets: 108 10*3/uL — ABNORMAL LOW (ref 150–400)
RBC: 4.59 MIL/uL (ref 4.22–5.81)
RBC: 4.56 MIL/uL (ref 4.22–5.81)
RBC: 4.66 MIL/uL (ref 4.22–5.81)
RDW: 17 % — ABNORMAL HIGH (ref 11.5–15.5)
RDW: 16.9 % — ABNORMAL HIGH (ref 11.5–15.5)
RDW: 17.1 % — ABNORMAL HIGH (ref 11.5–15.5)
WBC: 5.8 10*3/uL (ref 4.0–10.5)
WBC: 5.4 10*3/uL (ref 4.0–10.5)
WBC: 5.4 10*3/uL (ref 4.0–10.5)

## 2018-02-08 LAB — POCT I-STAT, CHEM 8
BUN: 46 mg/dL — AB (ref 8–23)
CALCIUM ION: 0.95 mmol/L — AB (ref 1.15–1.40)
Chloride: 106 mmol/L (ref 98–111)
Creatinine, Ser: 7.8 mg/dL — ABNORMAL HIGH (ref 0.61–1.24)
Glucose, Bld: 115 mg/dL — ABNORMAL HIGH (ref 70–99)
HCT: 48 % (ref 39.0–52.0)
HEMOGLOBIN: 16.3 g/dL (ref 13.0–17.0)
Potassium: 5.7 mmol/L — ABNORMAL HIGH (ref 3.5–5.1)
SODIUM: 139 mmol/L (ref 135–145)
TCO2: 24 mmol/L (ref 22–32)

## 2018-02-08 LAB — INFLUENZA PANEL BY PCR (TYPE A & B)
Influenza A By PCR: NEGATIVE
Influenza B By PCR: NEGATIVE

## 2018-02-08 LAB — CREATININE, SERUM
Creatinine, Ser: 7.35 mg/dL — ABNORMAL HIGH (ref 0.61–1.24)
GFR calc Af Amer: 8 mL/min — ABNORMAL LOW (ref >60)
GFR calc non Af Amer: 7 mL/min — ABNORMAL LOW (ref >60)

## 2018-02-08 LAB — PHOSPHORUS
Phosphorus: 6.2 mg/dL — ABNORMAL HIGH (ref 2.5–4.6)
Phosphorus: 6.3 mg/dL — ABNORMAL HIGH (ref 2.5–4.6)

## 2018-02-08 LAB — TROPONIN I
TROPONIN I: 0.12 ng/mL — AB (ref <0.03)
Troponin I: 0.1 ng/mL (ref <0.03)
Troponin I: 0.11 ng/mL (ref <0.03)

## 2018-02-08 LAB — GLUCOSE, CAPILLARY
Glucose-Capillary: 83 mg/dL (ref 70–99)
Glucose-Capillary: 89 mg/dL (ref 70–99)

## 2018-02-08 MED ORDER — CALCIUM ACETATE (PHOS BINDER) 667 MG PO CAPS
1334.0000 mg | ORAL_CAPSULE | Freq: Three times a day (TID) | ORAL | Status: DC
  Administered 2018-02-08 (×2): 1334 mg via ORAL
  Filled 2018-02-08 (×3): qty 2

## 2018-02-08 MED ORDER — CHLORHEXIDINE GLUCONATE CLOTH 2 % EX PADS
6.0000 | MEDICATED_PAD | Freq: Every day | CUTANEOUS | Status: DC
  Filled 2018-02-08: qty 6

## 2018-02-08 MED ORDER — PANTOPRAZOLE SODIUM 40 MG PO TBEC: 40.0000 mg | DELAYED_RELEASE_TABLET | Freq: Every day | ORAL | Status: DC

## 2018-02-08 MED ORDER — HEPARIN SODIUM (PORCINE) 1000 UNIT/ML DIALYSIS
1000.0000 [IU] | INTRAMUSCULAR | Status: DC | PRN
  Filled 2018-02-08: qty 1

## 2018-02-08 MED ORDER — CALCIUM CHLORIDE 10 % IV SOLN
1.0000 g | Freq: Once | INTRAVENOUS | Status: AC
  Administered 2018-02-08: 1 g via INTRAVENOUS
  Filled 2018-02-08: qty 10

## 2018-02-08 MED ORDER — ATORVASTATIN CALCIUM 20 MG PO TABS: 40.0000 mg | ORAL_TABLET | Freq: Every day | ORAL | Status: DC

## 2018-02-08 MED ORDER — SODIUM CHLORIDE 0.9 % IV SOLN: 100.0000 mL | INTRAVENOUS | Status: DC | PRN

## 2018-02-08 MED ORDER — ASPIRIN 81 MG PO CHEW
324.0000 mg | CHEWABLE_TABLET | Freq: Once | ORAL | Status: AC
  Administered 2018-02-08: 324 mg via ORAL
  Filled 2018-02-08: qty 4

## 2018-02-08 MED ORDER — ACETAMINOPHEN 325 MG PO TABS: 650.0000 mg | ORAL_TABLET | Freq: Four times a day (QID) | ORAL | Status: DC | PRN

## 2018-02-08 MED ORDER — LIDOCAINE-PRILOCAINE 2.5-2.5 % EX CREA: 1.0000 "application " | TOPICAL_CREAM | CUTANEOUS | Status: DC | PRN

## 2018-02-08 MED ORDER — HEPARIN SODIUM (PORCINE) 1000 UNIT/ML DIALYSIS
20.0000 [IU]/kg | INTRAMUSCULAR | Status: DC | PRN
  Filled 2018-02-08: qty 2

## 2018-02-08 MED ORDER — PATIROMER SORBITEX CALCIUM 8.4 G PO PACK
8.4000 g | PACK | Freq: Once | ORAL | Status: AC
  Administered 2018-02-08: 8.4 g via ORAL
  Filled 2018-02-08: qty 1

## 2018-02-08 MED ORDER — ENOXAPARIN SODIUM 40 MG/0.4ML ~~LOC~~ SOLN
40.0000 mg | SUBCUTANEOUS | Status: DC
  Filled 2018-02-08: qty 0.4

## 2018-02-08 MED ORDER — ONDANSETRON HCL 4 MG PO TABS: 4.0000 mg | ORAL_TABLET | Freq: Four times a day (QID) | ORAL | Status: DC | PRN

## 2018-02-08 MED ORDER — CINACALCET HCL 30 MG PO TABS
90.0000 mg | ORAL_TABLET | ORAL | Status: DC
  Filled 2018-02-08: qty 3

## 2018-02-08 MED ORDER — PENTAFLUOROPROP-TETRAFLUOROETH EX AERO: 1.0000 "application " | INHALATION_SPRAY | CUTANEOUS | Status: DC | PRN

## 2018-02-08 MED ORDER — NEPRO/CARBSTEADY PO LIQD: 237.0000 mL | ORAL | Status: DC | PRN

## 2018-02-08 MED ORDER — INSULIN ASPART 100 UNIT/ML ~~LOC~~ SOLN: 0.0000 [IU] | Freq: Three times a day (TID) | SUBCUTANEOUS | Status: DC

## 2018-02-08 MED ORDER — INSULIN ASPART 100 UNIT/ML ~~LOC~~ SOLN: 0.0000 [IU] | Freq: Every day | SUBCUTANEOUS | Status: DC

## 2018-02-08 MED ORDER — ONDANSETRON HCL 4 MG/2ML IJ SOLN: 4.0000 mg | Freq: Four times a day (QID) | INTRAMUSCULAR | Status: DC | PRN

## 2018-02-08 MED ORDER — NITROGLYCERIN 0.4 MG SL SUBL: 0.4000 mg | SUBLINGUAL_TABLET | SUBLINGUAL | Status: DC | PRN

## 2018-02-08 MED ORDER — ONDANSETRON HCL 4 MG/2ML IJ SOLN
4.0000 mg | Freq: Once | INTRAMUSCULAR | Status: AC
  Administered 2018-02-08: 4 mg via INTRAVENOUS
  Filled 2018-02-08: qty 2

## 2018-02-08 MED ORDER — LIDOCAINE HCL (PF) 1 % IJ SOLN: 5.0000 mL | INTRAMUSCULAR | Status: DC | PRN

## 2018-02-08 MED ORDER — HEPARIN SODIUM (PORCINE) 5000 UNIT/ML IJ SOLN: 5000.0000 [IU] | Freq: Three times a day (TID) | INTRAMUSCULAR | Status: DC

## 2018-02-08 MED ORDER — ACETAMINOPHEN 650 MG RE SUPP: 650.0000 mg | Freq: Four times a day (QID) | RECTAL | Status: DC | PRN

## 2018-02-08 NOTE — Progress Notes (Signed)
Pre HD assessment    02/08/18 0942  Vital Signs  Temp 97.7 F (36.5 C)  Temp Source Oral  Pulse Rate 77  Pulse Rate Source Monitor  Resp 12  BP 129/87  BP Location Right Arm  BP Method Automatic  Patient Position (if appropriate) Lying  Oxygen Therapy  SpO2 99 %  O2 Device Nasal Cannula  O2 Flow Rate (L/min) 3 L/min  Pain Assessment  Pain Scale 0-10  Pain Score 0  Dialysis Weight  Weight 77.1 kg  Type of Weight Pre-Dialysis  Time-Out for Hemodialysis  What Procedure? HD  Pt Identifiers(min of two) First/Last Name;MRN/Account#  Correct Site? Yes  Correct Side? Yes  Correct Procedure? Yes  Consents Verified? Yes  Rad Studies Available? N/A  Safety Precautions Reviewed? Yes  Engineer, civil (consulting) Number  (4A)  Station Number 1  UF/Alarm Test Passed  Conductivity: Meter 14  Conductivity: Machine  14  pH 7.6  Reverse Osmosis main  Normal Saline Lot Number [id number]  Dialyzer Lot Number [id number]  Disposable Set Lot Number [id number]  Machine Temperature 98.6 F (37 C)  Musician and Audible Yes  Blood Lines Intact and Secured Yes  Pre Treatment Patient Checks  Vascular access used during treatment Graft  Patient is receiving dialysis in a chair Yes  Hepatitis B Surface Antigen Results Negative  Date Hepatitis B Surface Antigen Drawn 02/15/17  Hepatitis B Surface Antibody  (>10)  Date Hepatitis B Surface Antibody Drawn 02/15/17  Hemodialysis Consent Verified Yes  Hemodialysis Standing Orders Initiated Yes  ECG (Telemetry) Monitor On Yes  Prime Ordered Normal Saline  Length of  DialysisTreatment -hour(s) 3.5 Hour(s)  Dialyzer Elisio 17H NR  Dialysate 2K, 2.5 Ca  Dialysis Anticoagulant None  Dialysate Flow Ordered 800  Blood Flow Rate Ordered 400 mL/min  Ultrafiltration Goal 3 Liters  Pre Treatment Labs Phosphorus (troponin )  Dialysis Blood Pressure Support Ordered Normal Saline  Education / Care Plan  Dialysis Education Provided Yes  Documented  Education in Care Plan Yes

## 2018-02-08 NOTE — ED Notes (Addendum)
Pt demanding to leave at this time. Explained to pt that bed availability is scarce d/t the number of recent admissions to the hospital and that we have been boarding several pt's awaiting beds in the hospital. Pt unsatisfied with that explanation and still demanding to leave.

## 2018-02-08 NOTE — Progress Notes (Signed)
Report given to ED RN, Deneise Lever.    02/08/18 1336  Hand-Off documentation  Report given to (Full Name) Leory Plowman  Report received from (Full Name) Stark Bray  Vital Signs  Pulse Rate 79  Resp 19  Oxygen Therapy  SpO2 100 %

## 2018-02-08 NOTE — ED Notes (Signed)
Lab tech informed this RN that first blood tube hemolyzed for the troponin. This RN will recollect.

## 2018-02-08 NOTE — ED Notes (Signed)
Pt given graham crackers and ice cream with medications while waiting for breakfast trays to arrive. MD aware.

## 2018-02-08 NOTE — Consult Note (Signed)
Cardiology Consultation:   Patient ID: [name] MRN: [id number]; DOB: 15-Nov-1955  Admit date: 02/07/2018 Date of Consult: 02/08/2018  Primary Care Provider: Donnie Coffin, MD Primary Cardiologist: Nelva Bush, MD , [facility] Reason for consult: Chest pain, cocaine use 3 days ago, short of breath Physician requesting consult: Dr. Lance Coon    Patient Profile:   [name] is a 62 y.o. male with a hx of end-stage renal disease on hemodialysis Monday Wednesday Friday, cocaine abuse last 3 days ago, presenting with chest pain, hypoxia, shortness of breath, troponin 0 0.12   History of Present Illness:   [name] has a history of coronary artery disease occluded RCA with collaterals from left to right, prior cardiac catheterization May 2018 cardiomyopathy presumed to be secondary to frequent PVCs, previously treated with amiodarone, as by Dr. Virl Axe (EP),  Ejection fraction 35%, hypertension, significant mitral valve regurgitation  Reports having chest pain or shortness of breath at home bringing him to the hospital Worsening shortness of breath past several weeks, feels his dialysis is not getting all the fluid off Reports he is compliant with dialysis Most recent dialysis on Monday, after treatment did not feel everything was pulled off  He does admit to high fluid intake at home, lots of ice chips Seen at the end of hemodialysis today 3 L out, still moderately short of breath  Lab work reviewed potassium 6.3 Seen by nephrology Plan for repeat hemodialysis tomorrow on Thursday  Chest x-ray on arrival cardiomegaly with vascular congestion  Past Medical History:  Diagnosis Date  . Arthritis   . CAD (coronary artery disease)    a. 09/2013 [facility]): basal inf defect more pronounced @ rest, likely artifact, EF 48%, low risk study; b. 06/2016 Cath [facility]): LAD 10p, 30-32m, 40-50d, D1 90(small), D2 40, D3 60-70, D4 30, LCX 10p, 30m, 20d, Om1 30, OM3 40, RCA 100p  w/ bridging collats, m/d RCA fill via collats, small/mod caliber-->Med Rx; c. 02/2017 MV: inf infarct w/ peri-inf ischemia. EF <30%.   . Chronic combined systolic and diastolic CHF (congestive heart failure) ([place])    a. 10/2013 Echo [facility]): EF 50%, diast dysfxn;  b. 10/2014 Echo: EF 30-35%, diff HK, gr1 DD;  c. 04/2016 Echo: EF 20-25%, sev dil LV, diff HK, gr3 DD; d. 06/2016 Echo: EF 30-35%, Gr2 DD.  . Diabetes ([place])   . Dyspnea   . ESRD (end stage renal disease) ([place])    a. MWF dialysis @ [facility].  . Hepatitis    Patient is unsure type of Hepatitis he has  . Hypertension   . Hypertensive heart disease with CHF (congestive heart failure) ([place])   . Mixed Ischemic and non-ischemic cardiomyopathy    a. 10/2013 Echo [facility]): EF 50%, diast dysfxn;  b. 10/2014 Echo: EF 30-35%, diff HK, gr1 DD;  c. 04/2016 Echo: EF 20-25%, sev dil LV, diff HK, gr3 DD; d. 06/2016 Echo: EF 30-35%, Gr2 DD.  [name] Moderate Aortic insufficiency    a. 04/2016 Echo: Mod AI.  [name] Myocardial infarction ([place])   . Peripheral vascular disease ([place])   . Pulmonary hypertension ([place])    a. 10/2014 Echo: Mod-Sev PAH.  [name] PVC's (premature ventricular contractions)    a. 02/2017 24 hr holter: freq polymorphic PVCs w/ total of 38K beats in 24 hrs (34% burden).    Past Surgical History:  Procedure Laterality Date  . A/V FISTULAGRAM Left 04/06/2016   Procedure: A/V Fistulagram;  Surgeon: Katha Cabal, MD;  Location:  [place] [facility];  Service: Cardiovascular;  Laterality: Left;  . A/V FISTULAGRAM Left 01/19/2017   Procedure: A/V FISTULAGRAM;  Surgeon: Katha Cabal, MD;  Location: [place] [facility];  Service: Cardiovascular;  Laterality: Left;  . A/V SHUNT INTERVENTION N/A 04/06/2016   Procedure: A/V Shunt Intervention;  Surgeon: Katha Cabal, MD;  Location: [place] [facility];  Service: Cardiovascular;  Laterality: N/A;  . A/V SHUNT INTERVENTION N/A 06/29/2016   Procedure: A/V Shunt Intervention;  Surgeon: Katha Cabal, MD;  Location: [place] [facility];  Service: Cardiovascular;  Laterality: N/A;  . AV FISTULA PLACEMENT Left 2014  . AV FISTULA PLACEMENT Left 06/01/2017   Procedure: INSERTION OF ARTERIOVENOUS (AV) GORE-TEX GRAFT ARM ( BRACHIAL AXILLARY );  Surgeon: Katha Cabal, MD;  Location: [facility];  Service: Vascular;  Laterality: Left;  . DIALYSIS/PERMA CATHETER INSERTION Right    Dr. Delana Meyer  . DIALYSIS/PERMA CATHETER REMOVAL N/A 07/05/2017   Procedure: DIALYSIS/PERMA CATHETER REMOVAL;  Surgeon: Katha Cabal, MD;  Location: [place] [facility];  Service: Cardiovascular;  Laterality: N/A;  . PERIPHERAL VASCULAR CATHETERIZATION N/A 10/03/2014   Procedure: A/V Shuntogram/Fistulagram;  Surgeon: Algernon Huxley, MD;  Location: [place] [facility];  Service: Cardiovascular;  Laterality: N/A;  . PERIPHERAL VASCULAR CATHETERIZATION Left 10/03/2014   Procedure: A/V Shunt Intervention;  Surgeon: Algernon Huxley, MD;  Location: [place] [facility];  Service: Cardiovascular;  Laterality: Left;  . PERIPHERAL VASCULAR CATHETERIZATION Left 05/01/2015   Procedure: A/V Shuntogram/Fistulagram;  Surgeon: Algernon Huxley, MD;  Location: [place] [facility];  Service: Cardiovascular;  Laterality: Left;  . PERIPHERAL VASCULAR CATHETERIZATION N/A 05/01/2015   Procedure: A/V Shunt Intervention;  Surgeon: Algernon Huxley, MD;  Location: [place] [facility];  Service: Cardiovascular;  Laterality: N/A;  . UPPER EXTREMITY ANGIOGRAPHY Bilateral 06/29/2016   Procedure: Upper Extremity Angiography;  Surgeon: Katha Cabal, MD;  Location: [place] [facility];  Service: Cardiovascular;  Laterality: Bilateral;  . UPPER EXTREMITY INTERVENTION  06/29/2016   Procedure: Upper Extremity Intervention;  Surgeon: Katha Cabal, MD;  Location: [place] [facility];  Service: Cardiovascular;;  . UPPER EXTREMITY VENOGRAPHY Left 03/15/2017   Procedure: UPPER EXTREMITY VENOGRAPHY;  Surgeon: Katha Cabal, MD;  Location: [place]  [facility];  Service: Cardiovascular;  Laterality: Left;     Home Medications:  Prior to Admission medications   Medication Sig Start Date End Date Taking? Authorizing Provider  acetaminophen (TYLENOL) 325 MG tablet Take 2 tablets (650 mg total) by mouth every 6 (six) hours as needed for mild pain (or Fever >/= 101). Patient taking differently: Take 500 mg by mouth every 8 (eight) hours as needed for mild pain (or Fever >/= 101).  09/21/16  Yes Gouru, Aruna, MD  albuterol (PROVENTIL HFA;VENTOLIN HFA) 108 (90 Base) MCG/ACT inhaler Inhale 2 puffs into the lungs every 4 (four) hours as needed for wheezing or shortness of breath.    Yes [provider]  aspirin 81 MG chewable tablet Chew 81 mg by mouth daily. 07/21/16  Yes [provider]  atorvastatin (LIPITOR) 40 MG tablet Take 1 tablet (40 mg total) by mouth daily. Patient taking differently: Take 40 mg by mouth every evening.  02/19/17  Yes Demetrios Loll, MD  calcium acetate (PHOSLO) 667 MG capsule Take 1,334-2,001 mg by mouth 3 (three) times daily with meals. take 3 capsules (2001mg ) by mouth three times daily before each meal, and 2 capsules (667mg ) by mouth with  snacks twice daily 12/11/15  Yes [provider]  carvedilol (COREG) 6.25 MG tablet Take 1 tablet (6.25 mg total) by mouth 2 (two) times daily with a meal. 09/21/16  Yes Gouru, Aruna, MD  cinacalcet (SENSIPAR) 90 MG tablet Take 60 mg by mouth daily.    Yes [provider]  furosemide (LASIX) 40 MG tablet Take 1 tablet (40 mg total) by mouth 2 (two) times daily. 02/18/17  Yes Demetrios Loll, MD  hydrALAZINE (APRESOLINE) 50 MG tablet Take 100 mg by mouth 3 (three) times daily.  07/01/16  Yes [provider]  ipratropium (ATROVENT HFA) 17 MCG/ACT inhaler Inhale 2 puffs into the lungs 4 (four) times daily as needed for wheezing.    Yes [provider]  isosorbide mononitrate (IMDUR) 30 MG 24 hr tablet Take 1 tablet (30 mg total) by mouth daily. 08/24/16  02/17/18 Yes Dustin Flock, MD  lidocaine-prilocaine (EMLA) cream Apply 1 application topically as needed (dialysis access).   Yes [provider]  nitroGLYCERIN (NITROSTAT) 0.4 MG SL tablet Place 0.4 mg under the tongue every 5 (five) minutes as needed for chest pain.   Yes [provider]  omeprazole (PRILOSEC) 40 MG capsule Take 40 mg by mouth every morning.  05/11/17  Yes [provider]  OXYGEN Inhale into the lungs daily as needed. Wears O2 at 2L [place] at home at night and as needed in the day   Yes [provider]  sacubitril-valsartan (ENTRESTO) 24-26 MG Take 1 tablet by mouth 2 (two) times daily. 03/16/17  Yes Theora Gianotti, NP  amiodarone (PACERONE) 200 MG tablet Take 1 tablet (200 mg total) by mouth daily. 04/06/17   Theora Gianotti, NP  oxyCODONE-acetaminophen (PERCOCET) 5-325 MG tablet Take 1-2 tablets by mouth every 6 (six) hours as needed for moderate pain or severe pain. Patient not taking: Reported on 02/08/2018 06/01/17 06/01/18  Schnier, Dolores Lory, MD  predniSONE (DELTASONE) 10 MG tablet Take 3 tablets once a day for 4 days Patient not taking: Reported on 02/08/2018 01/30/18   Johnn Hai, PA-C    Inpatient Medications: Scheduled Meds: . atorvastatin  40 mg Oral Daily  . calcium acetate  1,334 mg Oral TID WC  . Chlorhexidine Gluconate Cloth  6 each Topical Q0600  . cinacalcet  90 mg Oral Q M,W,F-HD  . heparin injection (subcutaneous)  5,000 Units Subcutaneous Q8H  . insulin aspart  0-5 Units Subcutaneous QHS  . insulin aspart  0-9 Units Subcutaneous TID WC  . pantoprazole  40 mg Oral Daily   Continuous Infusions:  PRN Meds: acetaminophen **OR** acetaminophen, nitroGLYCERIN, ondansetron **OR** ondansetron (ZOFRAN) IV  Allergies:    Allergies  Allergen Reactions  . Sulfa Antibiotics Rash and Shortness Of Breath    Social History:   Social History   Socioeconomic History  . Marital status: Widowed     Spouse name: Not on file  . Number of children: Not on file  . Years of education: Not on file  . Highest education level: Not on file  Occupational History  . Not on file  Social Needs  . Financial resource strain: Not on file  . Food insecurity:    Worry: Not on file    Inability: Not on file  . Transportation needs:    Medical: Not on file    Non-medical: Not on file  Tobacco Use  . Smoking status: Former Smoker    Packs/day: 0.25    Years: 40.00    Pack  years: 10.00    Types: Cigarettes    Last attempt to quit: 10/12/2016    Years since quitting: 1.3  . Smokeless tobacco: Never Used  Substance and Sexual Activity  . Alcohol use: No    Alcohol/week: 0.0 standard drinks  . Drug use: Yes    Types: Cocaine, Marijuana    Comment: patient states no drugs  . Sexual activity: Not Currently  Lifestyle  . Physical activity:    Days per week: Not on file    Minutes per session: Not on file  . Stress: Not on file  Relationships  . Social connections:    Talks on phone: Not on file    Gets together: Not on file    Attends religious service: Not on file    Active member of club or organization: Not on file    Attends meetings of clubs or organizations: Not on file    Relationship status: Not on file  . Intimate partner violence:    Fear of current or ex partner: Not on file    Emotionally abused: Not on file    Physically abused: Not on file    Forced sexual activity: Not on file  Other Topics Concern  . Not on file  Social History Narrative   Lives at home in [place] by himself.   Independent on ambulation but does not routinely exercise.    Family History:    Family History  Problem Relation Age of Onset  . Hypertension Son   . Diabetes Son   . Hypertension Mother   . Diabetes Mother   . Diabetes Sister      ROS:  Please see the history of present illness.  Review of Systems  Constitutional: Negative.   Respiratory: Positive for shortness of breath.     Cardiovascular: Positive for chest pain.  Gastrointestinal: Negative.   Musculoskeletal: Negative.   Neurological: Negative.   Psychiatric/Behavioral: Negative.   All other systems reviewed and are negative.    Physical Exam/Data:   Vitals:   02/08/18 1315 02/08/18 1326 02/08/18 1332 02/08/18 1336  BP: (!) 142/88 (!) 139/94 (!) 139/94   Pulse: 78 77 76 79  Resp: 18 17 17 19   Temp:   98.1 F (36.7 C)   TempSrc:   Oral   SpO2: 100% 100% 99% 100%  Weight:   76.2 kg   Height:        Intake/Output Summary (Last 24 hours) at 02/08/2018 1859 Last data filed at 02/08/2018 1332 Gross per 24 hour  Intake -  Output 3010 ml  Net -3010 ml   Filed Weights   02/07/18 2327 02/08/18 0942 02/08/18 1332  Weight: 77.1 kg 77.1 kg 76.2 kg   Body mass index is 25.54 kg/m.  General:  Well nourished, well developed, mild respiratory distress HEENT: normal Lymph: no adenopathy Neck: no JVD Endocrine:  No thryomegaly Vascular: No carotid bruits; FA pulses 2+ bilaterally without bruits  Cardiac:  normal S1, S2; 3/6 systolic ejection murmur left sternal border radiating into axilla Lungs:  clear to auscultation bilaterally, no wheezing, rhonchi or rales  Abd: soft, nontender, no hepatomegaly  Ext: no edema Musculoskeletal:  No deformities, BUE and BLE strength normal and equal Skin: warm and dry  Neuro:  CNs 2-12 intact, no focal abnormalities noted Psych:  Normal affect   EKG:  The EKG was personally reviewed and demonstrates:   Normal sinus rhythm LVH with repolarization abnormality  Telemetry:  Telemetry was personally reviewed and demonstrates:  Normal sinus rhythm  Relevant CV Studies:  EchocardiogramJuly 2019 home  - Left ventricle: The cavity size was at the upper limits of   normal. Wall thickness was increased in a pattern of severe LVH.   Systolic function was moderately reduced. The estimated ejection   fraction was in the range of 35% to 40%.  - Aortic valve:  moderate  regurgitation. - Mitral valve:  There was moderate to severe regurgitation   directed posteriorly.  - Left atrium: moderately dilated. - Tricuspid valve: There was moderate regurgitation. - Pulmonic valve: There was moderate regurgitation. - Pulmonary arteries: Systolic pressure was moderately to severely   increased, >= 65 mm Hg. - Inferior vena cava: The vessel was dilated. The respirophasic   diameter changes were blunted (< 50%), consistent with elevated   central venous pressure.  Laboratory Data:  Chemistry Recent Labs  Lab 02/07/18 2320 02/08/18 0035 02/08/18 0238 02/08/18 0645  NA 137 139  --  135  K 6.3* 5.7*  --  5.4*  CL 105 106  --  101  CO2 19*  --   --  21*  GLUCOSE 112* 115*  --  116*  BUN 48* 46*  --  56*  CREATININE 7.08* 7.80* 7.35* 7.44*  CALCIUM 8.0*  --   --  8.2*  GFRNONAA 7*  --  7* 7*  GFRAA 9*  --  8* 8*  ANIONGAP 13  --   --  13    No results for input(s): PROT, ALBUMIN, AST, ALT, ALKPHOS, BILITOT in the last 168 hours. Hematology Recent Labs  Lab 02/07/18 2320 02/08/18 0035 02/08/18 0238 02/08/18 0645  WBC 5.8  --  5.4 5.4  RBC 4.59  --  4.56 4.66  HGB 13.8 16.3 13.6 14.1  HCT 42.9 48.0 42.5 44.4  MCV 93.5  --  93.2 95.3  MCH 30.1  --  29.8 30.3  MCHC 32.2  --  32.0 31.8  RDW 17.0*  --  16.9* 17.1*  PLT 110*  --  105* 108*   Cardiac Enzymes Recent Labs  Lab 02/07/18 2320 02/08/18 0238 02/08/18 0930  TROPONINI 0.12* 0.10* 0.11*   No results for input(s): TROPIPOC in the last 168 hours.  BNPNo results for input(s): BNP, PROBNP in the last 168 hours.  DDimer No results for input(s): DDIMER in the last 168 hours.  Radiology/Studies:  Dg Chest Portable 1 View  Result Date: 02/07/2018 CLINICAL DATA:  Midsternal chest pain with dyspnea, cough and congestion since this morning. EXAM: PORTABLE CHEST 1 VIEW COMPARISON:  03/13/2017 FINDINGS: Stable cardiomegaly with moderate aortic atherosclerosis. Vascular stent projects over the  superior mediastinum and left upper lobe. Central vascular congestion is noted. No alveolar consolidation, effusion or pneumothorax. IMPRESSION: Cardiomegaly with aortic atherosclerosis. Mild central vascular congestion. Electronically Signed   By: Ashley Royalty M.D.   On: 02/07/2018 23:41    Assessment and Plan:   1. Acute respiratory distress Secondary to fluid overload in the setting of end-stage renal disease on hemodialysis, also cardiomyopathy -Markedly elevated right heart pressures on echocardiogram July 2019 Agree with extra dialysis tomorrow, and dialysis on Friday -Likely will need lower dry weight -Discussion with him today concerning decreasing his ice chips intake which is likely excessive  2) elevated troponin Reported chest pain on arrival Demand ischemia in the setting of cocaine, known coronary artery disease No plan for ischemic work-up at this time  3) cardiomyopathy Previously seen by Dr. Adam Phenix and Dr. Saunders Revel History of drug  abuse, also frequent PVCs Would continue amiodarone, carvedilol isosorbide and hydralazine  4) hyperkalemia Will improve with hemodialysis    Total encounter time more than 110 minutes  Greater than 50% was spent in counseling and coordination of care with the patient   For questions or updates, please contact [place] Please consult [email or web address] for contact info under     Signed, Ida Rogue, MD  02/08/2018 6:59 PM

## 2018-02-08 NOTE — ED Notes (Signed)
Called dietary at this time requesting meal tray for pt, dietary states they see renal diet order and pt will receive tray at dinner time,

## 2018-02-08 NOTE — H&P (Signed)
[facility] NAME: [name]    MR#:  [id number]  DATE OF BIRTH:  June 11, 1955  DATE OF ADMISSION:  02/07/2018  PRIMARY CARE PHYSICIAN: Donnie Coffin, MD   REQUESTING/REFERRING PHYSICIAN: Owens Shark, MD  CHIEF COMPLAINT:   Chief Complaint  Patient presents with  . Shortness of Breath  . Chest Pain    HISTORY OF PRESENT ILLNESS:  [name]  is a 62 y.o. male who presents with chief complaint as above.  Patient presents to the ED with chest pain and shortness of breath.  He states that he has a history of cocaine use, and reports last using cocaine 3 days ago.  He developed chest pain and shortness of breath today.  His EKG is nonischemic, though his troponin is elevated at 0.12.  Hospitalist were called for admission and further evaluation.  Of note the patient is also a dialysis patient, and his initial potassium level was 6.3, and 5.7 on repeat.  PAST MEDICAL HISTORY:   Past Medical History:  Diagnosis Date  . Arthritis   . CAD (coronary artery disease)    a. 09/2013 Myoview [facility]): basal inf defect more pronounced @ rest, likely artifact, EF 48%, low risk study; b. 06/2016 Cath [facility]): LAD 10p, 30-53m, 40-50d, D1 90(small), D2 40, D3 60-70, D4 30, LCX 10p, 59m, 20d, Om1 30, OM3 40, RCA 100p w/ bridging collats, m/d RCA fill via collats, small/mod caliber-->Med Rx; c. 02/2017 MV: inf infarct w/ peri-inf ischemia. EF <30%.   . Chronic combined systolic and diastolic CHF (congestive heart failure) ([place])    a. 10/2013 Echo [facility]): EF 50%, diast dysfxn;  b. 10/2014 Echo: EF 30-35%, diff HK, gr1 DD;  c. 04/2016 Echo: EF 20-25%, sev dil LV, diff HK, gr3 DD; d. 06/2016 Echo: EF 30-35%, Gr2 DD.  . Diabetes ([place])   . Dyspnea   . ESRD (end stage renal disease) ([place])    a. MWF dialysis @ [facility].  . Hepatitis    Patient is unsure type of Hepatitis he has  . Hypertension   . Hypertensive heart disease with CHF (congestive heart  failure) ([place])   . Mixed Ischemic and non-ischemic cardiomyopathy    a. 10/2013 Echo [facility]): EF 50%, diast dysfxn;  b. 10/2014 Echo: EF 30-35%, diff HK, gr1 DD;  c. 04/2016 Echo: EF 20-25%, sev dil LV, diff HK, gr3 DD; d. 06/2016 Echo: EF 30-35%, Gr2 DD.  [name] Moderate Aortic insufficiency    a. 04/2016 Echo: Mod AI.  [name] Myocardial infarction ([place])   . Peripheral vascular disease ([place])   . Pulmonary hypertension ([place])    a. 10/2014 Echo: Mod-Sev PAH.  [name] PVC's (premature ventricular contractions)    a. 02/2017 24 hr holter: freq polymorphic PVCs w/ total of 38K beats in 24 hrs (34% burden).     PAST SURGICAL HISTORY:   Past Surgical History:  Procedure Laterality Date  . A/V FISTULAGRAM Left 04/06/2016   Procedure: A/V Fistulagram;  Surgeon: Katha Cabal, MD;  Location: [place] [facility];  Service: Cardiovascular;  Laterality: Left;  . A/V FISTULAGRAM Left 01/19/2017   Procedure: A/V FISTULAGRAM;  Surgeon: Katha Cabal, MD;  Location: [place] CV LAB;  Service: Cardiovascular;  Laterality: Left;  . A/V SHUNT INTERVENTION N/A 04/06/2016   Procedure: A/V Shunt Intervention;  Surgeon: Katha Cabal, MD;  Location: [place] CV LAB;  Service: Cardiovascular;  Laterality: N/A;  . A/V SHUNT INTERVENTION N/A  06/29/2016   Procedure: A/V Shunt Intervention;  Surgeon: Katha Cabal, MD;  Location: [place] CV LAB;  Service: Cardiovascular;  Laterality: N/A;  . AV FISTULA PLACEMENT Left 2014  . AV FISTULA PLACEMENT Left 06/01/2017   Procedure: INSERTION OF ARTERIOVENOUS (AV) GORE-TEX GRAFT ARM ( BRACHIAL AXILLARY );  Surgeon: Katha Cabal, MD;  Location: [facility] ORS;  Service: Vascular;  Laterality: Left;  . DIALYSIS/PERMA CATHETER INSERTION Right    Dr. Delana Meyer  . DIALYSIS/PERMA CATHETER REMOVAL N/A 07/05/2017   Procedure: DIALYSIS/PERMA CATHETER REMOVAL;  Surgeon: Katha Cabal, MD;  Location: [place] CV LAB;  Service: Cardiovascular;  Laterality: N/A;  .  PERIPHERAL VASCULAR CATHETERIZATION N/A 10/03/2014   Procedure: A/V Shuntogram/Fistulagram;  Surgeon: Algernon Huxley, MD;  Location: [place] CV LAB;  Service: Cardiovascular;  Laterality: N/A;  . PERIPHERAL VASCULAR CATHETERIZATION Left 10/03/2014   Procedure: A/V Shunt Intervention;  Surgeon: Algernon Huxley, MD;  Location: [place] CV LAB;  Service: Cardiovascular;  Laterality: Left;  . PERIPHERAL VASCULAR CATHETERIZATION Left 05/01/2015   Procedure: A/V Shuntogram/Fistulagram;  Surgeon: Algernon Huxley, MD;  Location: [place] CV LAB;  Service: Cardiovascular;  Laterality: Left;  . PERIPHERAL VASCULAR CATHETERIZATION N/A 05/01/2015   Procedure: A/V Shunt Intervention;  Surgeon: Algernon Huxley, MD;  Location: [place] CV LAB;  Service: Cardiovascular;  Laterality: N/A;  . UPPER EXTREMITY ANGIOGRAPHY Bilateral 06/29/2016   Procedure: Upper Extremity Angiography;  Surgeon: Katha Cabal, MD;  Location: [place] [facility];  Service: Cardiovascular;  Laterality: Bilateral;  . UPPER EXTREMITY INTERVENTION  06/29/2016   Procedure: Upper Extremity Intervention;  Surgeon: Katha Cabal, MD;  Location: [place] CV LAB;  Service: Cardiovascular;;  . UPPER EXTREMITY VENOGRAPHY Left 03/15/2017   Procedure: UPPER EXTREMITY VENOGRAPHY;  Surgeon: Katha Cabal, MD;  Location: [place] CV LAB;  Service: Cardiovascular;  Laterality: Left;     SOCIAL HISTORY:   Social History   Tobacco Use  . Smoking status: Former Smoker    Packs/day: 0.25    Years: 40.00    Pack years: 10.00    Types: Cigarettes    Last attempt to quit: 10/12/2016    Years since quitting: 1.3  . Smokeless tobacco: Never Used  Substance Use Topics  . Alcohol use: No    Alcohol/week: 0.0 standard drinks     FAMILY HISTORY:   Family History  Problem Relation Age of Onset  . Hypertension Son   . Diabetes Son   . Hypertension Mother   . Diabetes Mother   . Diabetes Sister      DRUG ALLERGIES:    Allergies  Allergen Reactions  . Sulfa Antibiotics Rash and Shortness Of Breath    MEDICATIONS AT HOME:   Prior to Admission medications   Medication Sig Start Date End Date Taking? Authorizing Provider  acetaminophen (TYLENOL) 325 MG tablet Take 2 tablets (650 mg total) by mouth every 6 (six) hours as needed for mild pain (or Fever >/= 101). Patient taking differently: Take 500 mg by mouth every 6 (six) hours as needed for mild pain (or Fever >/= 101).  09/21/16   Gouru, Aruna, MD  albuterol (PROVENTIL HFA;VENTOLIN HFA) 108 (90 Base) MCG/ACT inhaler Inhale 2 puffs into the lungs every 4 (four) hours as needed for wheezing or shortness of breath.     [provider]  amiodarone (PACERONE) 200 MG tablet Take 1 tablet (200 mg total) by mouth daily. 04/06/17   Theora Gianotti, NP  atorvastatin (LIPITOR) 40 MG tablet Take 1 tablet (40 mg total) by mouth daily. 02/19/17   Demetrios Loll, MD  calcium acetate (PHOSLO) 667 MG capsule Take 1,334 mg by mouth 3 (three) times daily with meals.  12/11/15   [provider]  carvedilol (COREG) 6.25 MG tablet Take 1 tablet (6.25 mg total) by mouth 2 (two) times daily with a meal. 09/21/16   Gouru, Aruna, MD  cinacalcet (SENSIPAR) 90 MG tablet Take 90 mg by mouth See admin instructions. MON WED FRI - given at dialysis    [provider]  furosemide (LASIX) 40 MG tablet Take 1 tablet (40 mg total) by mouth 2 (two) times daily. 02/18/17   Demetrios Loll, MD  hydrALAZINE (APRESOLINE) 50 MG tablet Take 100 mg by mouth 3 (three) times daily.  07/01/16   [provider]  ipratropium (ATROVENT HFA) 17 MCG/ACT inhaler Inhale 2 puffs into the lungs every 4 (four) hours as needed for wheezing.    [provider]  isosorbide mononitrate (IMDUR) 30 MG 24 hr tablet Take 1 tablet (30 mg total) by mouth daily. Patient taking differently: Take 60 mg by mouth daily.  08/24/16 02/17/18  Dustin Flock, MD  lidocaine-prilocaine (EMLA)  cream Apply 1 application topically as needed (dialysis access).    [provider]  nitroGLYCERIN (NITROSTAT) 0.4 MG SL tablet Place 0.4 mg under the tongue every 5 (five) minutes as needed for chest pain.    [provider]  omeprazole (PRILOSEC) 40 MG capsule Take 40 mg by mouth daily. 05/11/17   [provider]  oxyCODONE-acetaminophen (PERCOCET) 5-325 MG tablet Take 1-2 tablets by mouth every 6 (six) hours as needed for moderate pain or severe pain. 06/01/17 06/01/18  Schnier, Dolores Lory, MD  OXYGEN Inhale into the lungs daily as needed. Wears O2 at 2L [place] at home at night and as needed in the day    [provider]  predniSONE (DELTASONE) 10 MG tablet Take 3 tablets once a day for 4 days 01/30/18   Johnn Hai, PA-C  sacubitril-valsartan (ENTRESTO) 24-26 MG Take 1 tablet by mouth 2 (two) times daily. 03/16/17   Theora Gianotti, NP    REVIEW OF SYSTEMS:  Review of Systems  Constitutional: Negative for chills, fever, malaise/fatigue and weight loss.  HENT: Negative for ear pain, hearing loss and tinnitus.   Eyes: Negative for blurred vision, double vision, pain and redness.  Respiratory: Positive for shortness of breath. Negative for cough and hemoptysis.   Cardiovascular: Positive for chest pain. Negative for palpitations, orthopnea and leg swelling.  Gastrointestinal: Negative for abdominal pain, constipation, diarrhea, nausea and vomiting.  Genitourinary: Negative for dysuria, frequency and hematuria.  Musculoskeletal: Negative for back pain, joint pain and neck pain.  Skin:       No acne, rash, or lesions  Neurological: Negative for dizziness, tremors, focal weakness and weakness.  Endo/Heme/Allergies: Negative for polydipsia. Does not bruise/bleed easily.  Psychiatric/Behavioral: Negative for depression. The patient is not nervous/anxious and does not have insomnia.      VITAL SIGNS:   Vitals:   02/07/18 2327 02/08/18 0000  BP: 134/90  (!) 142/86  Pulse: 87 82  Resp:  11  Temp: 97.8 F (36.6 C)   TempSrc: Oral   SpO2: 96% 96%  Weight: 77.1 kg   Height: 5\' 8"  (1.727 m)    Wt Readings from Last 3 Encounters:  02/07/18 77.1 kg  01/30/18 78.5 kg  07/05/17 76.2 kg    PHYSICAL  EXAMINATION:  Physical Exam  Vitals reviewed. Constitutional: He is oriented to person, place, and time. He appears well-developed and well-nourished. No distress.  HENT:  Head: Normocephalic and atraumatic.  Mouth/Throat: Oropharynx is clear and moist.  Eyes: Pupils are equal, round, and reactive to light. Conjunctivae and EOM are normal. No scleral icterus.  Neck: Normal range of motion. Neck supple. No JVD present. No thyromegaly present.  Cardiovascular: Normal rate, regular rhythm and intact distal pulses. Exam reveals no gallop and no friction rub.  No murmur heard. Respiratory: Effort normal. No respiratory distress. He has wheezes. He has no rales.  GI: Soft. Bowel sounds are normal. He exhibits no distension. There is no abdominal tenderness.  Musculoskeletal: Normal range of motion.        General: No edema.     Comments: No arthritis, no gout  Lymphadenopathy:    He has no cervical adenopathy.  Neurological: He is alert and oriented to person, place, and time. No cranial nerve deficit.  No dysarthria, no aphasia  Skin: Skin is warm and dry. No rash noted. No erythema.  Psychiatric: He has a normal mood and affect. His behavior is normal. Judgment and thought content normal.    LABORATORY PANEL:   CBC Recent Labs  Lab 02/07/18 2320 02/08/18 0035  WBC 5.8  --   HGB 13.8 16.3  HCT 42.9 48.0  PLT 110*  --    ------------------------------------------------------------------------------------------------------------------  Chemistries  Recent Labs  Lab 02/07/18 2320 02/08/18 0035  NA 137 139  K 6.3* 5.7*  CL 105 106  CO2 19*  --   GLUCOSE 112* 115*  BUN 48* 46*  CREATININE 7.08* 7.80*  CALCIUM 8.0*  --     ------------------------------------------------------------------------------------------------------------------  Cardiac Enzymes Recent Labs  Lab 02/07/18 2320  TROPONINI 0.12*   ------------------------------------------------------------------------------------------------------------------  RADIOLOGY:  Dg Chest Portable 1 View  Result Date: 02/07/2018 CLINICAL DATA:  Midsternal chest pain with dyspnea, cough and congestion since this morning. EXAM: PORTABLE CHEST 1 VIEW COMPARISON:  03/13/2017 FINDINGS: Stable cardiomegaly with moderate aortic atherosclerosis. Vascular stent projects over the superior mediastinum and left upper lobe. Central vascular congestion is noted. No alveolar consolidation, effusion or pneumothorax. IMPRESSION: Cardiomegaly with aortic atherosclerosis. Mild central vascular congestion. Electronically Signed   By: Ashley Royalty M.D.   On: 02/07/2018 23:41    EKG:   Orders placed or performed during the hospital encounter of 02/07/18  . ED EKG within 10 minutes  . ED EKG within 10 minutes  . ED EKG  . ED EKG    IMPRESSION AND PLAN:  Principal Problem:   Chest pain -strong suspicion that recent cocaine use may be triggering his chest pain.  We will trend his cardiac enzymes tonight, get an echocardiogram and a cardiology consult.  Avoid beta-blocker use Active Problems:   HTN (hypertension) -continue home meds   Hyperkalemia -one-time dose of [place] tonight, nephrology consult for dialysis   ESRD (end stage renal disease) on dialysis [facility]) -nephrology consult   Diabetes ([place]) -sliding scale insulin coverage   Chronic systolic heart failure ([place]) -continue home meds  Chart review performed and case discussed with ED provider. Labs, imaging and/or ECG reviewed by provider and discussed with patient/family. Management plans discussed with the patient and/or family.  DVT PROPHYLAXIS: SubQ lovenox   GI PROPHYLAXIS:  PPI   ADMISSION STATUS:  Inpatient     CODE STATUS: Full Code Status History    Date Active Date Inactive Code Status Order ID Comments User Context  03/14/2017 0007 03/14/2017 2204 Full Code [id number]  Amelia Jo, MD Inpatient   02/15/2017 2136 02/18/2017 1812 Full Code [id number]  Demetrios Loll, MD Inpatient   09/20/2016 0318 09/21/2016 1915 Full Code [id number]  Lance Coon, MD ED   08/22/2016 1658 08/24/2016 1607 Full Code [id number]  Demetrios Loll, MD Inpatient   07/04/2016 1929 07/05/2016 1728 Full Code [id number]  Idelle Crouch, MD Inpatient   06/07/2016 0102 06/08/2016 1514 Full Code [id number]  HugelmeyerUbaldo Glassing, DO Inpatient   04/30/2016 0007 05/01/2016 1821 Full Code [id number]  Lance Coon, MD Inpatient   02/26/2016 2107 02/28/2016 1804 Full Code [id number]  Lance Coon, MD Inpatient   01/07/2016 0132 01/07/2016 1724 Full Code [id number]  Harvie Bridge, DO ED   08/02/2015 2316 08/04/2015 2311 Full Code [id number]  Quintella Baton, MD Inpatient   12/02/2014 2052 12/04/2014 1753 Full Code [id number]  Vaughan Basta, MD Inpatient   11/10/2014 2323 11/12/2014 1506 Full Code [id number]  Gladstone Lighter, MD Inpatient      TOTAL TIME TAKING CARE OF THIS PATIENT: 45 minutes.   Avery Eustice [place] 02/08/2018, 12:59 AM  [facility]  Office  [phone]  CC: Primary care physician; Donnie Coffin, MD  Note:  This document was prepared using Dragon voice recognition software and may include unintentional dictation errors.

## 2018-02-08 NOTE — Progress Notes (Signed)
Spoke with ED RN, Deneise Lever, report recieved, pt to come down to dialysis.    02/08/18 0905  Vital Signs  Pulse Rate 77  Oxygen Therapy  SpO2 100 %

## 2018-02-08 NOTE — Progress Notes (Signed)
PHARMACY- PROVIDER COMMUNICATION  Patient was ordered enoxaparin 40mg .  Patient has a PMH of ESRD and receives HD MWF.   Per protocol enoxaparin was changed to heparin SQ 5000 q8h.   Pernell Dupre, PharmD, BCPS Clinical Pharmacist 02/08/2018 9:11 AM

## 2018-02-08 NOTE — ED Notes (Signed)
Pt states he wants to leave; no longer wants to wait for a bed to become available. This nurse counseled patient and he stated he is tired of waiting and wants to go. Contacted Dr. Darvin Neighbours, who states he is no longer on call but that he is aware of pt's situation and that others have spoken with him as well, so pt will just need to sign AMA form.

## 2018-02-08 NOTE — ED Notes (Signed)
This RN spoke with dialysis nurse to see if pt was able to come to dialysis. Dialysis RN informed this RN that they were not ready and she would call right back with a time that pt could come.

## 2018-02-08 NOTE — ED Notes (Signed)
Per Pt requested iv be removed at this time because it is causing him pain,.

## 2018-02-08 NOTE — ED Notes (Signed)
After a few more minutes of talking with the pt, he apologized for his earlier attitude and is now stating he will stay for treatment. I explained to the pt that WE ALL understand and share his frustration level at this time of very high hospital census, and that his concerns are not only reasonable, but on par with the numerous others that have been waiting for admission beds. Pt was also educated that, even though he was not in an "official" inpatient bed, that his medical treatment in the ED is no different that what he would be receiving upstairs. Pt satisfied with my explanation and again apologized for his earlier outburst.

## 2018-02-08 NOTE — ED Notes (Signed)
This RN called dialysis and spoke with MD. MD informed this RN that pt needs to be brought to dialysis around 9am.

## 2018-02-08 NOTE — Progress Notes (Deleted)
Post HD assessment. Pt tolerated tx well without c/o or complication. Net UF 3010, goal met.    02/08/18 1332  Vital Signs  Temp 98.1 F (36.7 C)  Temp Source Oral  Pulse Rate 76  Pulse Rate Source Monitor  Resp 17  BP (!) 139/94  BP Location Right Arm  BP Method Automatic  Patient Position (if appropriate) Lying  Oxygen Therapy  SpO2 99 %  O2 Device Nasal Cannula  O2 Flow Rate (L/min) 3 L/min  Dialysis Weight  Weight 75 kg  Type of Weight Post-Dialysis  Post-Hemodialysis Assessment  Rinseback Volume (mL) 250 mL  KECN 80.1 V  Dialyzer Clearance Lightly streaked  Duration of HD Treatment -hour(s) 3.5 hour(s)  Hemodialysis Intake (mL) 500 mL  UF Total -Machine (mL) 3510 mL  Net UF (mL) 3010 mL  Tolerated HD Treatment Yes  AVG/AVF Arterial Site Held (minutes) 10 minutes  AVG/AVF Venous Site Held (minutes) 10 minutes  Education / Care Plan  Dialysis Education Provided Yes  Documented Education in Care Plan Yes

## 2018-02-08 NOTE — Progress Notes (Signed)
HD tx end    02/08/18 1326  Vital Signs  Pulse Rate 77  Pulse Rate Source Monitor  Resp 17  BP (!) 139/94  BP Location Right Arm  BP Method Automatic  Patient Position (if appropriate) Lying  Oxygen Therapy  SpO2 100 %  O2 Device Nasal Cannula  O2 Flow Rate (L/min) 3 L/min  During Hemodialysis Assessment  Dialysis Fluid Bolus Normal Saline  Bolus Amount (mL) 250 mL  Intra-Hemodialysis Comments Tx completed

## 2018-02-08 NOTE — Progress Notes (Signed)
[facility], [place] 02/08/18  Subjective:   Patient known to our practice from outpatient dialysis as well as previous admissions He presents for chest pain as well as shortness of breath.  Lab results showed mild hyperkalemia. Chest x-ray shows cardiomegaly with vascular congestion.  Nephrology consult requested for urgent hemodialysis.  Objective:  Vital signs in last 24 hours:  Temp:  [97.7 F (36.5 C)-97.8 F (36.6 C)] 97.7 F (36.5 C) (12/18 0942) Pulse Rate:  [61-87] 79 (12/18 1100) Resp:  [11-25] 20 (12/18 1100) BP: (121-144)/(49-115) 127/100 (12/18 1100) SpO2:  [94 %-100 %] 100 % (12/18 1100) Weight:  [77.1 kg] 77.1 kg (12/18 0942)  Weight change:  Filed Weights   02/07/18 2327 02/08/18 0942  Weight: 77.1 kg 77.1 kg    Intake/Output:   No intake or output data in the 24 hours ending 02/08/18 1105   Physical Exam: General:  Sitting in recliner chair, no acute distress  HEENT  anicteric  Neck  supple  Pulm/lungs  mild basilar crackles bilaterally, [place] O2 supplementation  CVS/Heart  sinus with PVCs  Abdomen:   Soft  Extremities:  No edema  Neurologic:  Alert, oriented  Skin:  Normal turgor  Access:  AV graft       Basic Metabolic Panel:  Recent Labs  Lab 02/07/18 2320 02/08/18 0035 02/08/18 0238 02/08/18 0645 02/08/18 0930  NA 137 139  --  135  --   K 6.3* 5.7*  --  5.4*  --   CL 105 106  --  101  --   CO2 19*  --   --  21*  --   GLUCOSE 112* 115*  --  116*  --   BUN 48* 46*  --  56*  --   CREATININE 7.08* 7.80* 7.35* 7.44*  --   CALCIUM 8.0*  --   --  8.2*  --   PHOS  --   --  6.2*  --  6.3*     CBC: Recent Labs  Lab 02/07/18 2320 02/08/18 0035 02/08/18 0238 02/08/18 0645  WBC 5.8  --  5.4 5.4  HGB 13.8 16.3 13.6 14.1  HCT 42.9 48.0 42.5 44.4  MCV 93.5  --  93.2 95.3  PLT 110*  --  105* 108*      Lab Results  Component Value Date   HEPBSAG Negative 02/15/2017   HEPBSAB Reactive 02/15/2017       Microbiology:  No results found for this or any previous visit (from the past 240 hour(s)).  Coagulation Studies: No results for input(s): LABPROT, INR in the last 72 hours.  Urinalysis: No results for input(s): COLORURINE, LABSPEC, PHURINE, GLUCOSEU, HGBUR, BILIRUBINUR, KETONESUR, PROTEINUR, UROBILINOGEN, NITRITE, LEUKOCYTESUR in the last 72 hours.  Invalid input(s): APPERANCEUR    Imaging: Dg Chest Portable 1 View  Result Date: 02/07/2018 CLINICAL DATA:  Midsternal chest pain with dyspnea, cough and congestion since this morning. EXAM: PORTABLE CHEST 1 VIEW COMPARISON:  03/13/2017 FINDINGS: Stable cardiomegaly with moderate aortic atherosclerosis. Vascular stent projects over the superior mediastinum and left upper lobe. Central vascular congestion is noted. No alveolar consolidation, effusion or pneumothorax. IMPRESSION: Cardiomegaly with aortic atherosclerosis. Mild central vascular congestion. Electronically Signed   By: Ashley Royalty M.D.   On: 02/07/2018 23:41     Medications:   . sodium chloride    . sodium chloride     . atorvastatin  40 mg Oral Daily  . calcium acetate  1,334 mg Oral TID WC  .  Chlorhexidine Gluconate Cloth  6 each Topical Q0600  . cinacalcet  90 mg Oral Q M,W,F-HD  . heparin injection (subcutaneous)  5,000 Units Subcutaneous Q8H  . insulin aspart  0-5 Units Subcutaneous QHS  . insulin aspart  0-9 Units Subcutaneous TID WC  . pantoprazole  40 mg Oral Daily   sodium chloride, sodium chloride, acetaminophen **OR** acetaminophen, feeding supplement (NEPRO CARB STEADY), heparin, heparin, lidocaine (PF), lidocaine-prilocaine, nitroGLYCERIN, ondansetron **OR** ondansetron (ZOFRAN) IV, pentafluoroprop-tetrafluoroeth  Assessment/ Plan:  62 y.o. male withend stage renal disease on hemodialysis, hypertension, hepatitis C, anemia, seizure disorder, COPD/tobacco abuse,history of substance abuse,secondary hyperparathyroidism  [facility] MWF [facility]  [facility].Left arm AVG.  240 minutes  1.  Acute pulm edema and chest pain in setting of ESRD (dialysis status) Outpatient dry weight is 74 kg Current weight 77 kg We will arrange for urgent dialysis for UF of 3 kg as tolerated She states that his chest pain is improved this morning Troponin mildly elevated at 0.11    2. Anemia of chronic kidney disease: hemoglobin 14.1 -Hold Epogen  3. Secondary Hyperparathyroidism: with hyperphosphatemia.   -Continue home dose of binders  4.  Hyperkalemia Mild.  Expected to improve with hemodialysis   LOS: 0 Ramell Wacha Candiss Norse 12/18/201911:05 AM  [place], [place]  Note: This note was prepared with Dragon dictation. Any transcription errors are unintentional

## 2018-02-08 NOTE — Progress Notes (Signed)
HD tx start    02/08/18 7902  Vital Signs  Pulse Rate 74  Pulse Rate Source Monitor  Resp 20  BP 121/86  BP Location Right Arm  BP Method Automatic  Patient Position (if appropriate) Lying  Oxygen Therapy  SpO2 98 %  O2 Device Nasal Cannula  O2 Flow Rate (L/min) 3 L/min  During Hemodialysis Assessment  Blood Flow Rate (mL/min) 400 mL/min  Arterial Pressure (mmHg) -90 mmHg  Venous Pressure (mmHg) 130 mmHg  Transmembrane Pressure (mmHg) 70 mmHg  Ultrafiltration Rate (mL/min) 1000 mL/min  Dialysate Flow Rate (mL/min) 800 ml/min  Conductivity: Machine  14  HD Safety Checks Performed Yes  Dialysis Fluid Bolus Normal Saline  Bolus Amount (mL) 250 mL  Intra-Hemodialysis Comments Tx initiated

## 2018-02-08 NOTE — Progress Notes (Signed)
Post HD assessment    02/08/18 1331  Neurological  Level of Consciousness Alert  Orientation Level Oriented X4  Respiratory  Respiratory Pattern Regular;Unlabored  Chest Assessment Chest expansion symmetrical  Cardiac  Pulse Irregular  ECG Monitor Yes  Cardiac Rhythm NSR  Ectopy Unifocal PVC's  Ectopy Frequency Frequent  Vascular  R Radial Pulse +2  L Radial Pulse +2  Edema Generalized  Integumentary  Integumentary (WDL) X  Skin Color Appropriate for ethnicity  Musculoskeletal  Musculoskeletal (WDL) X  Generalized Weakness Yes  Assistive Device None  GU Assessment  Genitourinary (WDL) X  Genitourinary Symptoms  (HD )  Psychosocial  Psychosocial (WDL) WDL

## 2018-02-08 NOTE — Progress Notes (Signed)
Pre HD assessment    02/08/18 0943  Neurological  Level of Consciousness Alert  Orientation Level Oriented X4  Respiratory  Respiratory Pattern Regular;Unlabored  Chest Assessment Chest expansion symmetrical  Cardiac  ECG Monitor Yes  Cardiac Rhythm NSR  Ectopy Unifocal PVC's  Ectopy Frequency Frequent  Vascular  R Radial Pulse +2  L Radial Pulse +2  Edema Generalized  Integumentary  Integumentary (WDL) X  Skin Color Appropriate for ethnicity  Musculoskeletal  Musculoskeletal (WDL) X  Generalized Weakness Yes  Assistive Device None  GU Assessment  Genitourinary (WDL) X  Genitourinary Symptoms  (HD)  Psychosocial  Psychosocial (WDL) WDL

## 2018-02-08 NOTE — ED Notes (Addendum)
Per pt "I should have just gone to [facility], im about to cuss all yalls asses out. Rude bitches who dont know nothing." Pt informed he cannot speak to nursing staff in this way.

## 2018-02-08 NOTE — Discharge Instructions (Signed)
Resume diet and activity as before ° ° °

## 2018-02-08 NOTE — ED Notes (Signed)
Pt declined again request to stay and wait for a bed on floor. He is insistent on leaving.

## 2018-02-08 NOTE — Progress Notes (Signed)
Post HD assessment. Pt tolerated tx well without c/o or complication. Net UF 3010, goal met.    02/08/18 1332  Vital Signs  Temp 98.1 F (36.7 C)  Temp Source Oral  Pulse Rate 76  Pulse Rate Source Monitor  Resp 17  BP (!) 139/94  BP Location Right Arm  BP Method Automatic  Patient Position (if appropriate) Lying  Oxygen Therapy  SpO2 99 %  O2 Device Nasal Cannula  O2 Flow Rate (L/min) 3 L/min  Dialysis Weight  Weight 76.2 kg  Type of Weight Post-Dialysis (scale, standing weight)  Post-Hemodialysis Assessment  Rinseback Volume (mL) 250 mL  KECN 80.1 V  Dialyzer Clearance Lightly streaked  Duration of HD Treatment -hour(s) 3.5 hour(s)  Hemodialysis Intake (mL) 500 mL  UF Total -Machine (mL) 3510 mL  Net UF (mL) 3010 mL  Tolerated HD Treatment Yes  AVG/AVF Arterial Site Held (minutes) 10 minutes  AVG/AVF Venous Site Held (minutes) 10 minutes  Education / Care Plan  Dialysis Education Provided Yes  Documented Education in Care Plan Yes

## 2018-02-09 LAB — HIV ANTIBODY (ROUTINE TESTING W REFLEX): HIV Screen 4th Generation wRfx: NONREACTIVE

## 2018-03-17 ENCOUNTER — Telehealth: Payer: Self-pay | Admitting: Internal Medicine

## 2018-03-17 NOTE — Telephone Encounter (Signed)
Attempted to contact patient for follow up - no answer no VM Dr Clide Deutscher wants patient to re-establish care with cardio

## 2018-03-30 ENCOUNTER — Encounter: Payer: Self-pay | Admitting: Internal Medicine

## 2018-03-30 NOTE — Telephone Encounter (Signed)
No ans no vm .   Mailed Letter

## 2018-04-17 ENCOUNTER — Inpatient Hospital Stay
Admission: EM | Admit: 2018-04-17 | Discharge: 2018-04-21 | DRG: 377 | Disposition: A | Discharge status: Home / Self Care | Payer: Medicare Other | Attending: Internal Medicine | Admitting: Internal Medicine

## 2018-04-17 ENCOUNTER — Other Ambulatory Visit: Payer: Self-pay

## 2018-04-17 DIAGNOSIS — G40909 Epilepsy, unspecified, not intractable, without status epilepticus: Secondary | ICD-10-CM | POA: Diagnosis present

## 2018-04-17 DIAGNOSIS — N2581 Secondary hyperparathyroidism of renal origin: Secondary | ICD-10-CM | POA: Diagnosis present

## 2018-04-17 DIAGNOSIS — E1122 Type 2 diabetes mellitus with diabetic chronic kidney disease: Secondary | ICD-10-CM | POA: Diagnosis present

## 2018-04-17 DIAGNOSIS — I255 Ischemic cardiomyopathy: Secondary | ICD-10-CM | POA: Diagnosis present

## 2018-04-17 DIAGNOSIS — K922 Gastrointestinal hemorrhage, unspecified: Secondary | ICD-10-CM | POA: Diagnosis present

## 2018-04-17 DIAGNOSIS — D631 Anemia in chronic kidney disease: Secondary | ICD-10-CM | POA: Diagnosis present

## 2018-04-17 DIAGNOSIS — Z882 Allergy status to sulfonamides status: Secondary | ICD-10-CM

## 2018-04-17 DIAGNOSIS — K759 Inflammatory liver disease, unspecified: Secondary | ICD-10-CM | POA: Diagnosis present

## 2018-04-17 DIAGNOSIS — K264 Chronic or unspecified duodenal ulcer with hemorrhage: Principal | ICD-10-CM | POA: Diagnosis present

## 2018-04-17 DIAGNOSIS — E1151 Type 2 diabetes mellitus with diabetic peripheral angiopathy without gangrene: Secondary | ICD-10-CM | POA: Diagnosis present

## 2018-04-17 DIAGNOSIS — J449 Chronic obstructive pulmonary disease, unspecified: Secondary | ICD-10-CM | POA: Diagnosis present

## 2018-04-17 DIAGNOSIS — I252 Old myocardial infarction: Secondary | ICD-10-CM

## 2018-04-17 DIAGNOSIS — I351 Nonrheumatic aortic (valve) insufficiency: Secondary | ICD-10-CM | POA: Diagnosis present

## 2018-04-17 DIAGNOSIS — Z7982 Long term (current) use of aspirin: Secondary | ICD-10-CM

## 2018-04-17 DIAGNOSIS — M549 Dorsalgia, unspecified: Secondary | ICD-10-CM

## 2018-04-17 DIAGNOSIS — D649 Anemia, unspecified: Secondary | ICD-10-CM

## 2018-04-17 DIAGNOSIS — Z992 Dependence on renal dialysis: Secondary | ICD-10-CM

## 2018-04-17 DIAGNOSIS — N186 End stage renal disease: Secondary | ICD-10-CM | POA: Diagnosis present

## 2018-04-17 DIAGNOSIS — Z8711 Personal history of peptic ulcer disease: Secondary | ICD-10-CM

## 2018-04-17 DIAGNOSIS — I959 Hypotension, unspecified: Secondary | ICD-10-CM | POA: Diagnosis present

## 2018-04-17 DIAGNOSIS — Z87891 Personal history of nicotine dependence: Secondary | ICD-10-CM

## 2018-04-17 DIAGNOSIS — K2980 Duodenitis without bleeding: Secondary | ICD-10-CM | POA: Diagnosis present

## 2018-04-17 DIAGNOSIS — Z8249 Family history of ischemic heart disease and other diseases of the circulatory system: Secondary | ICD-10-CM

## 2018-04-17 DIAGNOSIS — Z833 Family history of diabetes mellitus: Secondary | ICD-10-CM

## 2018-04-17 DIAGNOSIS — Z79899 Other long term (current) drug therapy: Secondary | ICD-10-CM

## 2018-04-17 DIAGNOSIS — K921 Melena: Secondary | ICD-10-CM

## 2018-04-17 DIAGNOSIS — Z9981 Dependence on supplemental oxygen: Secondary | ICD-10-CM

## 2018-04-17 DIAGNOSIS — I5042 Chronic combined systolic (congestive) and diastolic (congestive) heart failure: Secondary | ICD-10-CM | POA: Diagnosis present

## 2018-04-17 DIAGNOSIS — I272 Pulmonary hypertension, unspecified: Secondary | ICD-10-CM | POA: Diagnosis present

## 2018-04-17 DIAGNOSIS — I132 Hypertensive heart and chronic kidney disease with heart failure and with stage 5 chronic kidney disease, or end stage renal disease: Secondary | ICD-10-CM | POA: Diagnosis present

## 2018-04-17 DIAGNOSIS — I2510 Atherosclerotic heart disease of native coronary artery without angina pectoris: Secondary | ICD-10-CM | POA: Diagnosis present

## 2018-04-17 DIAGNOSIS — J9611 Chronic respiratory failure with hypoxia: Secondary | ICD-10-CM | POA: Diagnosis present

## 2018-04-17 LAB — COMPREHENSIVE METABOLIC PANEL
ALT: 18 U/L (ref 0–44)
ANION GAP: 8 (ref 5–15)
AST: 25 U/L (ref 15–41)
Albumin: 2.8 g/dL — ABNORMAL LOW (ref 3.5–5.0)
Alkaline Phosphatase: 80 U/L (ref 38–126)
BUN: 83 mg/dL — ABNORMAL HIGH (ref 8–23)
CO2: 26 mmol/L (ref 22–32)
Calcium: 8.1 mg/dL — ABNORMAL LOW (ref 8.9–10.3)
Chloride: 107 mmol/L (ref 98–111)
Creatinine, Ser: 4.28 mg/dL — ABNORMAL HIGH (ref 0.61–1.24)
GFR calc non Af Amer: 14 mL/min — ABNORMAL LOW (ref >60)
GFR, EST AFRICAN AMERICAN: 16 mL/min — AB (ref >60)
GLUCOSE: 63 mg/dL — AB (ref 70–99)
Potassium: 4.8 mmol/L (ref 3.5–5.1)
Sodium: 141 mmol/L (ref 135–145)
Total Bilirubin: 1.2 mg/dL (ref 0.3–1.2)
Total Protein: 5.9 g/dL — ABNORMAL LOW (ref 6.5–8.1)

## 2018-04-17 LAB — CBC
HCT: 27.3 % — ABNORMAL LOW (ref 39.0–52.0)
Hemoglobin: 8.5 g/dL — ABNORMAL LOW (ref 13.0–17.0)
MCH: 29.7 pg (ref 26.0–34.0)
MCHC: 31.1 g/dL (ref 30.0–36.0)
MCV: 95.5 fL (ref 80.0–100.0)
NRBC: 0.3 % — AB (ref 0.0–0.2)
Platelets: 153 10*3/uL (ref 150–400)
RBC: 2.86 MIL/uL — AB (ref 4.22–5.81)
RDW: 17.3 % — ABNORMAL HIGH (ref 11.5–15.5)
WBC: 9.2 10*3/uL (ref 4.0–10.5)

## 2018-04-17 MED ORDER — LABETALOL HCL 5 MG/ML IV SOLN
INTRAVENOUS | Status: AC
  Filled 2018-04-17: qty 4

## 2018-04-17 NOTE — ED Notes (Signed)
Pt had large bloody/clotted BM in waiting area and was brought back to room. Pt denying any pain. Pt stating sx started at 1500 today. Pt stating bloody BM x4 PTA. Pt denying any pain at this time. Warm blankets given to pt along with socks. Pt was cleaned and changed prior to being brought to room. Pt had dialysis today.

## 2018-04-17 NOTE — ED Triage Notes (Signed)
Pt arrives to ED via POV from home with c/o rectal bleeding and epistaxis that started around 3pm following dialysis. No c/o CP or SHOB. No N/V/D or fever. Pt reports rectal bleeding is "very dark and a lot". Pt denies use of anticoagulants.

## 2018-04-18 ENCOUNTER — Encounter: Payer: Self-pay | Admitting: Anesthesiology

## 2018-04-18 ENCOUNTER — Encounter
Admission: EM | Disposition: A | Discharge status: Home / Self Care | Payer: Self-pay | Source: Home / Self Care | Attending: Internal Medicine

## 2018-04-18 ENCOUNTER — Other Ambulatory Visit: Payer: Self-pay

## 2018-04-18 DIAGNOSIS — I959 Hypotension, unspecified: Secondary | ICD-10-CM | POA: Diagnosis present

## 2018-04-18 DIAGNOSIS — I2510 Atherosclerotic heart disease of native coronary artery without angina pectoris: Secondary | ICD-10-CM | POA: Diagnosis present

## 2018-04-18 DIAGNOSIS — Z833 Family history of diabetes mellitus: Secondary | ICD-10-CM | POA: Diagnosis not present

## 2018-04-18 DIAGNOSIS — Z8249 Family history of ischemic heart disease and other diseases of the circulatory system: Secondary | ICD-10-CM | POA: Diagnosis not present

## 2018-04-18 DIAGNOSIS — N2581 Secondary hyperparathyroidism of renal origin: Secondary | ICD-10-CM | POA: Diagnosis present

## 2018-04-18 DIAGNOSIS — Z9981 Dependence on supplemental oxygen: Secondary | ICD-10-CM | POA: Diagnosis not present

## 2018-04-18 DIAGNOSIS — K759 Inflammatory liver disease, unspecified: Secondary | ICD-10-CM | POA: Diagnosis present

## 2018-04-18 DIAGNOSIS — G40909 Epilepsy, unspecified, not intractable, without status epilepticus: Secondary | ICD-10-CM | POA: Diagnosis present

## 2018-04-18 DIAGNOSIS — I5042 Chronic combined systolic (congestive) and diastolic (congestive) heart failure: Secondary | ICD-10-CM | POA: Diagnosis present

## 2018-04-18 DIAGNOSIS — I272 Pulmonary hypertension, unspecified: Secondary | ICD-10-CM | POA: Diagnosis present

## 2018-04-18 DIAGNOSIS — E1151 Type 2 diabetes mellitus with diabetic peripheral angiopathy without gangrene: Secondary | ICD-10-CM | POA: Diagnosis present

## 2018-04-18 DIAGNOSIS — J9611 Chronic respiratory failure with hypoxia: Secondary | ICD-10-CM | POA: Diagnosis present

## 2018-04-18 DIAGNOSIS — J449 Chronic obstructive pulmonary disease, unspecified: Secondary | ICD-10-CM | POA: Diagnosis present

## 2018-04-18 DIAGNOSIS — N186 End stage renal disease: Secondary | ICD-10-CM | POA: Diagnosis present

## 2018-04-18 DIAGNOSIS — I252 Old myocardial infarction: Secondary | ICD-10-CM | POA: Diagnosis not present

## 2018-04-18 DIAGNOSIS — K264 Chronic or unspecified duodenal ulcer with hemorrhage: Secondary | ICD-10-CM | POA: Diagnosis present

## 2018-04-18 DIAGNOSIS — K2980 Duodenitis without bleeding: Secondary | ICD-10-CM | POA: Diagnosis present

## 2018-04-18 DIAGNOSIS — D631 Anemia in chronic kidney disease: Secondary | ICD-10-CM | POA: Diagnosis present

## 2018-04-18 DIAGNOSIS — Z8711 Personal history of peptic ulcer disease: Secondary | ICD-10-CM | POA: Diagnosis not present

## 2018-04-18 DIAGNOSIS — I255 Ischemic cardiomyopathy: Secondary | ICD-10-CM | POA: Diagnosis present

## 2018-04-18 DIAGNOSIS — K625 Hemorrhage of anus and rectum: Secondary | ICD-10-CM | POA: Diagnosis not present

## 2018-04-18 DIAGNOSIS — K269 Duodenal ulcer, unspecified as acute or chronic, without hemorrhage or perforation: Secondary | ICD-10-CM | POA: Diagnosis not present

## 2018-04-18 DIAGNOSIS — E1122 Type 2 diabetes mellitus with diabetic chronic kidney disease: Secondary | ICD-10-CM | POA: Diagnosis present

## 2018-04-18 DIAGNOSIS — I351 Nonrheumatic aortic (valve) insufficiency: Secondary | ICD-10-CM | POA: Diagnosis present

## 2018-04-18 DIAGNOSIS — Z7982 Long term (current) use of aspirin: Secondary | ICD-10-CM | POA: Diagnosis not present

## 2018-04-18 DIAGNOSIS — K922 Gastrointestinal hemorrhage, unspecified: Secondary | ICD-10-CM | POA: Diagnosis present

## 2018-04-18 DIAGNOSIS — K921 Melena: Secondary | ICD-10-CM | POA: Diagnosis present

## 2018-04-18 DIAGNOSIS — I132 Hypertensive heart and chronic kidney disease with heart failure and with stage 5 chronic kidney disease, or end stage renal disease: Secondary | ICD-10-CM | POA: Diagnosis present

## 2018-04-18 LAB — CBC
HCT: 22.7 % — ABNORMAL LOW (ref 39.0–52.0)
HEMOGLOBIN: 7.3 g/dL — AB (ref 13.0–17.0)
MCH: 30.5 pg (ref 26.0–34.0)
MCHC: 32.2 g/dL (ref 30.0–36.0)
MCV: 95 fL (ref 80.0–100.0)
Platelets: 151 10*3/uL (ref 150–400)
RBC: 2.39 MIL/uL — ABNORMAL LOW (ref 4.22–5.81)
RDW: 17 % — ABNORMAL HIGH (ref 11.5–15.5)
WBC: 10.2 10*3/uL (ref 4.0–10.5)
nRBC: 0.5 % — ABNORMAL HIGH (ref 0.0–0.2)

## 2018-04-18 LAB — BASIC METABOLIC PANEL
Anion gap: 11 (ref 5–15)
BUN: 134 mg/dL — ABNORMAL HIGH (ref 8–23)
CO2: 20 mmol/L — ABNORMAL LOW (ref 22–32)
Calcium: 7.6 mg/dL — ABNORMAL LOW (ref 8.9–10.3)
Chloride: 111 mmol/L (ref 98–111)
Creatinine, Ser: 4.97 mg/dL — ABNORMAL HIGH (ref 0.61–1.24)
GFR calc Af Amer: 13 mL/min — ABNORMAL LOW (ref >60)
GFR calc non Af Amer: 11 mL/min — ABNORMAL LOW (ref >60)
Glucose, Bld: 72 mg/dL (ref 70–99)
Potassium: 5.4 mmol/L — ABNORMAL HIGH (ref 3.5–5.1)
SODIUM: 142 mmol/L (ref 135–145)

## 2018-04-18 LAB — HEMOGLOBIN AND HEMATOCRIT, BLOOD
HCT: 27.8 % — ABNORMAL LOW (ref 39.0–52.0)
Hemoglobin: 9.5 g/dL — ABNORMAL LOW (ref 13.0–17.0)

## 2018-04-18 LAB — PREPARE RBC (CROSSMATCH)

## 2018-04-18 LAB — TSH: TSH: 4.017 u[IU]/mL (ref 0.350–4.500)

## 2018-04-18 LAB — MRSA PCR SCREENING: MRSA by PCR: NEGATIVE

## 2018-04-18 SURGERY — ESOPHAGOGASTRODUODENOSCOPY (EGD) WITH PROPOFOL
Anesthesia: General

## 2018-04-18 MED ORDER — SODIUM CHLORIDE 0.9 % IV SOLN
80.0000 mg | Freq: Once | INTRAVENOUS | Status: AC
  Administered 2018-04-18: 80 mg via INTRAVENOUS
  Filled 2018-04-18: qty 80

## 2018-04-18 MED ORDER — PENTAFLUOROPROP-TETRAFLUOROETH EX AERO
1.0000 "application " | INHALATION_SPRAY | CUTANEOUS | Status: DC | PRN
  Filled 2018-04-18: qty 30

## 2018-04-18 MED ORDER — ONDANSETRON HCL 4 MG/2ML IJ SOLN: 4.0000 mg | Freq: Four times a day (QID) | INTRAMUSCULAR | Status: DC | PRN

## 2018-04-18 MED ORDER — SODIUM CHLORIDE 0.9 % IV SOLN
8.0000 mg/h | INTRAVENOUS | Status: DC
  Administered 2018-04-18 – 2018-04-20 (×4): 8 mg/h via INTRAVENOUS
  Filled 2018-04-18 (×7): qty 80

## 2018-04-18 MED ORDER — CINACALCET HCL 30 MG PO TABS
60.0000 mg | ORAL_TABLET | Freq: Every day | ORAL | Status: DC
  Administered 2018-04-20 – 2018-04-21 (×2): 60 mg via ORAL
  Filled 2018-04-18 (×4): qty 2

## 2018-04-18 MED ORDER — HYDROXYZINE HCL 10 MG PO TABS
10.0000 mg | ORAL_TABLET | Freq: Three times a day (TID) | ORAL | Status: DC | PRN
  Administered 2018-04-19: 10 mg via ORAL
  Filled 2018-04-18 (×3): qty 1

## 2018-04-18 MED ORDER — HEPARIN SODIUM (PORCINE) 1000 UNIT/ML DIALYSIS
1000.0000 [IU] | INTRAMUSCULAR | Status: DC | PRN
  Filled 2018-04-18: qty 1

## 2018-04-18 MED ORDER — ACETAMINOPHEN 325 MG PO TABS
650.0000 mg | ORAL_TABLET | Freq: Four times a day (QID) | ORAL | Status: DC | PRN
  Administered 2018-04-21: 650 mg via ORAL
  Filled 2018-04-18: qty 2

## 2018-04-18 MED ORDER — AMIODARONE HCL 200 MG PO TABS
200.0000 mg | ORAL_TABLET | Freq: Every day | ORAL | Status: DC
  Administered 2018-04-18 – 2018-04-20 (×3): 200 mg via ORAL
  Filled 2018-04-18 (×4): qty 1

## 2018-04-18 MED ORDER — SODIUM CHLORIDE 0.9 % IV SOLN: 100.0000 mL | INTRAVENOUS | Status: DC | PRN

## 2018-04-18 MED ORDER — LIDOCAINE HCL (PF) 1 % IJ SOLN
5.0000 mL | INTRAMUSCULAR | Status: DC | PRN
  Filled 2018-04-18: qty 5

## 2018-04-18 MED ORDER — LIDOCAINE-PRILOCAINE 2.5-2.5 % EX CREA
1.0000 "application " | TOPICAL_CREAM | CUTANEOUS | Status: DC | PRN
  Filled 2018-04-18: qty 5

## 2018-04-18 MED ORDER — PANTOPRAZOLE SODIUM 40 MG IV SOLR
40.0000 mg | Freq: Two times a day (BID) | INTRAVENOUS | Status: DC
  Administered 2018-04-18: 40 mg via INTRAVENOUS
  Filled 2018-04-18 (×2): qty 40

## 2018-04-18 MED ORDER — DIPHENHYDRAMINE HCL 25 MG PO CAPS
25.0000 mg | ORAL_CAPSULE | Freq: Four times a day (QID) | ORAL | Status: DC | PRN
  Administered 2018-04-18 – 2018-04-21 (×4): 25 mg via ORAL
  Filled 2018-04-18 (×3): qty 1

## 2018-04-18 MED ORDER — ONDANSETRON HCL 4 MG PO TABS: 4.0000 mg | ORAL_TABLET | Freq: Four times a day (QID) | ORAL | Status: DC | PRN

## 2018-04-18 MED ORDER — ALTEPLASE 2 MG IJ SOLR: 2.0000 mg | Freq: Once | INTRAMUSCULAR | Status: DC | PRN

## 2018-04-18 MED ORDER — ACETAMINOPHEN 650 MG RE SUPP: 650.0000 mg | Freq: Four times a day (QID) | RECTAL | Status: DC | PRN

## 2018-04-18 MED ORDER — PANTOPRAZOLE SODIUM 40 MG IV SOLR: 40.0000 mg | Freq: Two times a day (BID) | INTRAVENOUS | Status: DC

## 2018-04-18 MED ORDER — SODIUM CHLORIDE 0.9 % IV SOLN
Freq: Once | INTRAVENOUS | Status: AC
  Administered 2018-04-18: 05:00:00 via INTRAVENOUS

## 2018-04-18 MED ORDER — CHLORHEXIDINE GLUCONATE CLOTH 2 % EX PADS: 6.0000 | MEDICATED_PAD | Freq: Every day | CUTANEOUS | Status: DC

## 2018-04-18 MED ORDER — IPRATROPIUM-ALBUTEROL 0.5-2.5 (3) MG/3ML IN SOLN: 3.0000 mL | Freq: Four times a day (QID) | RESPIRATORY_TRACT | Status: DC | PRN

## 2018-04-18 MED ORDER — SODIUM CHLORIDE 0.9% IV SOLUTION
Freq: Once | INTRAVENOUS | Status: AC
  Administered 2018-04-18: 12:00:00 via INTRAVENOUS

## 2018-04-18 NOTE — Progress Notes (Signed)
Pre HD Assessment    04/18/18 1340  Neurological  Level of Consciousness Alert  Orientation Level Oriented X4  Respiratory  Respiratory Pattern Regular;Unlabored  Chest Assessment Chest expansion symmetrical  Cough None  Cardiac  Pulse Regular  Heart Sounds S1, S2  ECG Monitor Yes  Cardiac Rhythm NSR  Vascular  R Radial Pulse +1  L Radial Pulse +1  Edema Generalized  Generalized Edema +1  Psychosocial  Psychosocial (WDL) WDL

## 2018-04-18 NOTE — Progress Notes (Signed)
HD TX terminated, by pt. Pt states he had TX yesterday and did not want to continue with [facility]    04/18/18 1630  Vital Signs  Pulse Rate 76  Pulse Rate Source Monitor  Resp 19  BP (!) 108/53  BP Location Right Arm  BP Method Automatic  Patient Position (if appropriate) Lying  Oxygen Therapy  SpO2 100 %  O2 Device Nasal Cannula  O2 Flow Rate (L/min) 3 L/min  Pulse Oximetry Type Continuous  During Hemodialysis Assessment  HD Safety Checks Performed Yes  KECN 39.4 KECN  Dialysis Fluid Bolus Normal Saline  Bolus Amount (mL) 250 mL  Intra-Hemodialysis Comments Tx completed;Tolerated well  Fistula / Graft Left Forearm Arteriovenous fistula  Placement Date: 09/20/16   Placed prior to admission: Yes  Orientation: Left  Access Location: Forearm  Access Type: Arteriovenous fistula  Status Deaccessed

## 2018-04-18 NOTE — ED Notes (Addendum)
Resumed care from [place], [place]. Pt sleeping in room with even and unlabored respirations. Blood transfusion running at 110 ml/hr.

## 2018-04-18 NOTE — ED Notes (Signed)
Pt had loose dark, foul smelling stools. About 384ml of stool. Pt in NAD at this time and still denies pain.

## 2018-04-18 NOTE — ED Notes (Signed)
ED TO INPATIENT HANDOFF REPORT  Name/Age/Gender [name] 63 y.o. male  Code Status    Code Status Orders  (From admission, onward)         Start     Ordered   04/18/18 0421  Full code  Continuous     04/18/18 0420        Code Status History    Date Active Date Inactive Code Status Order ID Comments User Context   02/08/2018 0233 02/08/2018 2214 Full Code [id number]  Lance Coon, MD ED   03/14/2017 0007 03/14/2017 2204 Full Code [id number]  Amelia Jo, MD Inpatient   02/15/2017 2136 02/18/2017 1812 Full Code [id number]  Demetrios Loll, MD Inpatient   09/20/2016 0318 09/21/2016 1915 Full Code [id number]  Lance Coon, MD ED   08/22/2016 1658 08/24/2016 1607 Full Code [id number]  Demetrios Loll, MD Inpatient   07/04/2016 1929 07/05/2016 1728 Full Code [id number]  Idelle Crouch, MD Inpatient   06/07/2016 0102 06/08/2016 1514 Full Code [id number]  Hugelmeyer, Alexis, DO Inpatient   04/30/2016 0007 05/01/2016 1821 Full Code [id number]  Lance Coon, MD Inpatient   02/26/2016 2107 02/28/2016 1804 Full Code [id number]  Lance Coon, MD Inpatient   01/07/2016 0132 01/07/2016 1724 Full Code [id number]  Hugelmeyer, Alexis, DO ED   08/02/2015 2316 08/04/2015 2311 Full Code [id number]  Quintella Baton, MD Inpatient   12/02/2014 2052 12/04/2014 1753 Full Code [id number]  Vaughan Basta, MD Inpatient   11/10/2014 2323 11/12/2014 1506 Full Code [id number]  Gladstone Lighter, MD Inpatient      Home/SNF/Other Home  Chief Complaint Melena [K92.1] ESRD on hemodialysis ([place]) [N18.6, Z99.2] Symptomatic anemia [D64.9] Hypotension, unspecified hypotension type [I95.9]  Level of Care/Admitting Diagnosis ED Disposition    ED Disposition Condition [place]: [facility] [100120]  Level of Care: Med-Surg [16]  Diagnosis: GI bleed [500370]  Admitting Physician: Harrie Foreman [[id number]]  Attending Physician: Harrie Foreman [[id number]]  Estimated length of stay:  past midnight tomorrow  Certification:: I certify this patient will need inpatient services for at least 2 midnights  PT Class (Do Not Modify): Inpatient [101]  PT Acc Code (Do Not Modify): Private [1]       Medical History Past Medical History:  Diagnosis Date  . Arthritis   . CAD (coronary artery disease)    a. 09/2013 [facility]): basal inf defect more pronounced @ rest, likely artifact, EF 48%, low risk study; b. 06/2016 Cath [facility]): LAD 10p, 30-42m, 40-50d, D1 90(small), D2 40, D3 60-70, D4 30, LCX 10p, 21m, 20d, Om1 30, OM3 40, RCA 100p w/ bridging collats, m/d RCA fill via collats, small/mod caliber-->Med Rx; c. 02/2017 MV: inf infarct w/ peri-inf ischemia. EF <30%.   . Chronic combined systolic and diastolic CHF (congestive heart failure) ([place])    a. 10/2013 Echo [facility]): EF 50%, diast dysfxn;  b. 10/2014 Echo: EF 30-35%, diff HK, gr1 DD;  c. 04/2016 Echo: EF 20-25%, sev dil LV, diff HK, gr3 DD; d. 06/2016 Echo: EF 30-35%, Gr2 DD.  . Diabetes ([place])   . Dyspnea   . ESRD (end stage renal disease) ([place])    a. MWF dialysis @ [facility].  . Hepatitis    Patient is unsure type of Hepatitis he has  . Hypertension   . Hypertensive heart disease with CHF (congestive heart failure) ([place])   . Mixed Ischemic and non-ischemic cardiomyopathy    a. 10/2013 Echo [facility]): EF 50%, diast dysfxn;  b.  10/2014 Echo: EF 30-35%, diff HK, gr1 DD;  c. 04/2016 Echo: EF 20-25%, sev dil LV, diff HK, gr3 DD; d. 06/2016 Echo: EF 30-35%, Gr2 DD.  [name] Moderate Aortic insufficiency    a. 04/2016 Echo: Mod AI.  [name] Myocardial infarction ([place])   . Peripheral vascular disease ([place])   . Pulmonary hypertension ([place])    a. 10/2014 Echo: Mod-Sev PAH.  [name] PVC's (premature ventricular contractions)    a. 02/2017 24 hr holter: freq polymorphic PVCs w/ total of 38K beats in 24 hrs (34% burden).    Allergies Allergies  Allergen Reactions  . Sulfa Antibiotics Rash and Shortness Of Breath    IV Location/Drains/Wounds Patient  Lines/Drains/Airways Status   Active Line/Drains/Airways    Name:   Placement date:   Placement time:   Site:   Days:   Peripheral IV 04/17/18 Right Forearm   04/17/18    2342    Forearm   1   Peripheral IV 04/17/18 Right Antecubital   04/17/18    2335    Antecubital   1   Fistula / Graft Left Forearm Arteriovenous fistula   09/20/16    -    Forearm   575   Fistula / Graft Left Upper arm Arteriovenous vein graft   06/01/17    0832    Upper arm   321   Sheath 03/15/17 Left Venous   03/15/17    0915    Venous   399   Incision (Closed) 06/01/17 Arm Left   06/01/17    0828     321          Labs/Imaging Results for orders placed or performed during the hospital encounter of 04/17/18 (from the past 48 hour(s))  Comprehensive metabolic panel     Status: Abnormal   Collection Time: 04/17/18  7:32 PM  Result Value Ref Range   Sodium 141 135 - 145 mmol/L   Potassium 4.8 3.5 - 5.1 mmol/L   Chloride 107 98 - 111 mmol/L   CO2 26 22 - 32 mmol/L   Glucose, Bld 63 (L) 70 - 99 mg/dL   BUN 83 (H) 8 - 23 mg/dL   Creatinine, Ser 4.28 (H) 0.61 - 1.24 mg/dL   Calcium 8.1 (L) 8.9 - 10.3 mg/dL   Total Protein 5.9 (L) 6.5 - 8.1 g/dL   Albumin 2.8 (L) 3.5 - 5.0 g/dL   AST 25 15 - 41 U/L   ALT 18 0 - 44 U/L   Alkaline Phosphatase 80 38 - 126 U/L   Total Bilirubin 1.2 0.3 - 1.2 mg/dL   GFR calc non Af Amer 14 (L) >60 mL/min   GFR calc Af Amer 16 (L) >60 mL/min   Anion gap 8 5 - 15    Comment: Performed at [facility], [address] [place], [place] [address]  CBC     Status: Abnormal   Collection Time: 04/17/18  7:32 PM  Result Value Ref Range   WBC 9.2 4.0 - 10.5 K/uL   RBC 2.86 (L) 4.22 - 5.81 MIL/uL   Hemoglobin 8.5 (L) 13.0 - 17.0 g/dL   HCT 27.3 (L) 39.0 - 52.0 %   MCV 95.5 80.0 - 100.0 fL   MCH 29.7 26.0 - 34.0 pg   MCHC 31.1 30.0 - 36.0 g/dL   RDW 17.3 (H) 11.5 - 15.5 %   Platelets 153 150 - 400 K/uL   nRBC 0.3 (H) 0.0 - 0.2 %    Comment:  Performed at [facility],  [address] [place], [place] [address]  Type and screen [place]     Status: None (Preliminary result)   Collection Time: 04/17/18  7:32 PM  Result Value Ref Range   ABO/RH(D) O POS    Antibody Screen NEG    Sample Expiration 04/20/2018    Unit Number [id number]    Blood Component Type RED CELLS,LR    Unit division 00    Status of Unit ISSUED    Transfusion Status OK TO TRANSFUSE    Crossmatch Result      Compatible Performed at [facility], [address] [place], [place] [address]    Unit Number [id number]    Blood Component Type RED CELLS,LR    Unit division 00    Status of Unit ALLOCATED    Transfusion Status OK TO TRANSFUSE    Crossmatch Result Compatible    Unit Number [id number]    Blood Component Type RED CELLS,LR    Unit division 00    Status of Unit ALLOCATED    Transfusion Status OK TO TRANSFUSE    Crossmatch Result Compatible   Prepare RBC     Status: None   Collection Time: 04/18/18 12:08 AM  Result Value Ref Range   Order Confirmation      ORDER PROCESSED BY BLOOD BANK Performed at [facility], [address], [place],  [address]    No results found.  Pending Labs Unresulted Labs (From admission, onward)    Start     Ordered   04/18/18 0429  MRSA PCR Screening  Once,   R     04/18/18 0429   04/18/18 0421  TSH  Add-on,   AD     04/18/18 0420          Vitals/Pain Today's Vitals   04/18/18 0135 04/18/18 0321 04/18/18 0322 04/18/18 0359  BP: (!) 112/56 (!) 95/56  107/61  Pulse: 84 83  80  Resp: 18 16  18   Temp: 98.1 F (36.7 C)     TempSrc: Oral     SpO2: 100% 100%  100%  Weight:      Height:      PainSc:   Asleep     Isolation Precautions No active isolations  Medications Medications  labetalol (NORMODYNE,TRANDATE) 5 MG/ML injection (has no administration in time range)  0.9 %  sodium chloride infusion (has no administration in time range)  amiodarone (PACERONE)  tablet 200 mg (has no administration in time range)  cinacalcet (SENSIPAR) tablet 60 mg (has no administration in time range)  ipratropium-albuterol (DUONEB) 0.5-2.5 (3) MG/3ML nebulizer solution 3 mL (has no administration in time range)  acetaminophen (TYLENOL) tablet 650 mg (has no administration in time range)    Or  acetaminophen (TYLENOL) suppository 650 mg (has no administration in time range)  ondansetron (ZOFRAN) tablet 4 mg (has no administration in time range)    Or  ondansetron (ZOFRAN) injection 4 mg (has no administration in time range)  pantoprazole (PROTONIX) injection 40 mg (has no administration in time range)    Mobility x2 assist

## 2018-04-18 NOTE — Progress Notes (Signed)
HD Tx started w/o issue    04/18/18 1405  Vital Signs  Pulse Rate 72  Pulse Rate Source Monitor  Resp 16  BP 121/76  BP Location Right Arm  BP Method Automatic  Patient Position (if appropriate) Lying  Oxygen Therapy  O2 Device Nasal Cannula  O2 Flow Rate (L/min) 2 L/min  Pulse Oximetry Type Continuous  During Hemodialysis Assessment  Blood Flow Rate (mL/min) 400 mL/min  Arterial Pressure (mmHg) -190 mmHg  Venous Pressure (mmHg) 220 mmHg  Transmembrane Pressure (mmHg) 40 mmHg  Ultrafiltration Rate (mL/min) 670 mL/min  Dialysate Flow Rate (mL/min) 800 ml/min  Conductivity: Machine  13.7  HD Safety Checks Performed Yes  Dialysis Fluid Bolus Normal Saline  Bolus Amount (mL) 250 mL  Intra-Hemodialysis Comments Tx initiated  Fistula / Graft Left Forearm Arteriovenous fistula  Placement Date: 09/20/16   Placed prior to admission: Yes  Orientation: Left  Access Location: Forearm  Access Type: Arteriovenous fistula  Status Accessed  Needle Size 15g  Drainage Description None

## 2018-04-18 NOTE — H&P (Signed)
[name] is an 63 y.o. male.   Chief Complaint: Rectal bleeding HPI: The patient with past medical history significant for ischemic heart disease, combined systolic and diastolic CHF, diabetes, end-stage renal disease on dialysis, hypertension and history of duodenal ulcer presents to the emergency department from dialysis after developing dark stools.  The patient does not contribute much of his history this examiner due to somnolence and/or reticence.  Laboratory evaluation revealed 6 g drop in hemoglobin.  Blood transfusion was started in the emergency department prior to the hospitalist service being called for admission.  Past Medical History:  Diagnosis Date  . Arthritis   . CAD (coronary artery disease)    a. 09/2013 Myoview [facility]): basal inf defect more pronounced @ rest, likely artifact, EF 48%, low risk study; b. 06/2016 Cath [facility]): LAD 10p, 30-77m, 40-50d, D1 90(small), D2 40, D3 60-70, D4 30, LCX 10p, 9m, 20d, Om1 30, OM3 40, RCA 100p w/ bridging collats, m/d RCA fill via collats, small/mod caliber-->Med Rx; c. 02/2017 MV: inf infarct w/ peri-inf ischemia. EF <30%.   . Chronic combined systolic and diastolic CHF (congestive heart failure) ([place])    a. 10/2013 Echo [facility]): EF 50%, diast dysfxn;  b. 10/2014 Echo: EF 30-35%, diff HK, gr1 DD;  c. 04/2016 Echo: EF 20-25%, sev dil LV, diff HK, gr3 DD; d. 06/2016 Echo: EF 30-35%, Gr2 DD.  . Diabetes ([place])   . Dyspnea   . ESRD (end stage renal disease) ([place])    a. MWF dialysis @ [facility].  . Hepatitis    Patient is unsure type of Hepatitis he has  . Hypertension   . Hypertensive heart disease with CHF (congestive heart failure) ([place])   . Mixed Ischemic and non-ischemic cardiomyopathy    a. 10/2013 Echo [facility]): EF 50%, diast dysfxn;  b. 10/2014 Echo: EF 30-35%, diff HK, gr1 DD;  c. 04/2016 Echo: EF 20-25%, sev dil LV, diff HK, gr3 DD; d. 06/2016 Echo: EF 30-35%, Gr2 DD.  [name] Moderate Aortic insufficiency    a. 04/2016 Echo: Mod AI.  [name]  Myocardial infarction ([place])   . Peripheral vascular disease ([place])   . Pulmonary hypertension ([place])    a. 10/2014 Echo: Mod-Sev PAH.  [name] PVC's (premature ventricular contractions)    a. 02/2017 24 hr holter: freq polymorphic PVCs w/ total of 38K beats in 24 hrs (34% burden).    Past Surgical History:  Procedure Laterality Date  . A/V FISTULAGRAM Left 04/06/2016   Procedure: A/V Fistulagram;  Surgeon: Katha Cabal, MD;  Location: [place] [facility];  Service: Cardiovascular;  Laterality: Left;  . A/V FISTULAGRAM Left 01/19/2017   Procedure: A/V FISTULAGRAM;  Surgeon: Katha Cabal, MD;  Location: [place] [facility];  Service: Cardiovascular;  Laterality: Left;  . A/V SHUNT INTERVENTION N/A 04/06/2016   Procedure: A/V Shunt Intervention;  Surgeon: Katha Cabal, MD;  Location: [place] [facility];  Service: Cardiovascular;  Laterality: N/A;  . A/V SHUNT INTERVENTION N/A 06/29/2016   Procedure: A/V Shunt Intervention;  Surgeon: Katha Cabal, MD;  Location: [place] [facility];  Service: Cardiovascular;  Laterality: N/A;  . AV FISTULA PLACEMENT Left 2014  . AV FISTULA PLACEMENT Left 06/01/2017   Procedure: INSERTION OF ARTERIOVENOUS (AV) GORE-TEX GRAFT ARM ( BRACHIAL AXILLARY );  Surgeon: Katha Cabal, MD;  Location: [facility];  Service: Vascular;  Laterality: Left;  . DIALYSIS/PERMA CATHETER INSERTION Right    Dr. Delana Meyer  . DIALYSIS/PERMA CATHETER REMOVAL N/A 07/05/2017  Procedure: DIALYSIS/PERMA CATHETER REMOVAL;  Surgeon: Katha Cabal, MD;  Location: [place] [facility];  Service: Cardiovascular;  Laterality: N/A;  . PERIPHERAL VASCULAR CATHETERIZATION N/A 10/03/2014   Procedure: A/V Shuntogram/Fistulagram;  Surgeon: Algernon Huxley, MD;  Location: [place] [facility];  Service: Cardiovascular;  Laterality: N/A;  . PERIPHERAL VASCULAR CATHETERIZATION Left 10/03/2014   Procedure: A/V Shunt Intervention;  Surgeon: Algernon Huxley, MD;  Location: [place] [facility];   Service: Cardiovascular;  Laterality: Left;  . PERIPHERAL VASCULAR CATHETERIZATION Left 05/01/2015   Procedure: A/V Shuntogram/Fistulagram;  Surgeon: Algernon Huxley, MD;  Location: [place] [facility];  Service: Cardiovascular;  Laterality: Left;  . PERIPHERAL VASCULAR CATHETERIZATION N/A 05/01/2015   Procedure: A/V Shunt Intervention;  Surgeon: Algernon Huxley, MD;  Location: [place] [facility];  Service: Cardiovascular;  Laterality: N/A;  . UPPER EXTREMITY ANGIOGRAPHY Bilateral 06/29/2016   Procedure: Upper Extremity Angiography;  Surgeon: Katha Cabal, MD;  Location: [place] [facility];  Service: Cardiovascular;  Laterality: Bilateral;  . UPPER EXTREMITY INTERVENTION  06/29/2016   Procedure: Upper Extremity Intervention;  Surgeon: Katha Cabal, MD;  Location: [place] [facility];  Service: Cardiovascular;;  . UPPER EXTREMITY VENOGRAPHY Left 03/15/2017   Procedure: UPPER EXTREMITY VENOGRAPHY;  Surgeon: Katha Cabal, MD;  Location: [place] [facility];  Service: Cardiovascular;  Laterality: Left;    Family History  Problem Relation Age of Onset  . Hypertension Son   . Diabetes Son   . Hypertension Mother   . Diabetes Mother   . Diabetes Sister    Social History:  reports that he quit smoking about 18 months ago. His smoking use included cigarettes. He has a 10.00 pack-year smoking history. He has never used smokeless tobacco. He reports current drug use. Drugs: Cocaine and Marijuana. He reports that he does not drink alcohol.  Allergies:  Allergies  Allergen Reactions  . Sulfa Antibiotics Rash and Shortness Of Breath    Medications Prior to Admission  Medication Sig Dispense Refill  . acetaminophen (TYLENOL) 325 MG tablet Take 2 tablets (650 mg total) by mouth every 6 (six) hours as needed for mild pain (or Fever >/= 101). (Patient taking differently: Take 500 mg by mouth every 8 (eight) hours as needed for mild pain (or Fever >/= 101). )    . albuterol (PROVENTIL  HFA;VENTOLIN HFA) 108 (90 Base) MCG/ACT inhaler Inhale 2 puffs into the lungs every 4 (four) hours as needed for wheezing or shortness of breath.     [name] amiodarone (PACERONE) 200 MG tablet Take 1 tablet (200 mg total) by mouth daily. 30 tablet 6  . aspirin 81 MG chewable tablet Chew 81 mg by mouth daily.    [name] atorvastatin (LIPITOR) 40 MG tablet Take 1 tablet (40 mg total) by mouth daily. (Patient taking differently: Take 40 mg by mouth every evening. ) 30 tablet 1  . calcium acetate (PHOSLO) 667 MG capsule Take 1,334-2,001 mg by mouth 3 (three) times daily with meals. take 3 capsules (2001mg ) by mouth three times daily before each meal, and 2 capsules (667mg ) by mouth with snacks twice daily    . carvedilol (COREG) 6.25 MG tablet Take 1 tablet (6.25 mg total) by mouth 2 (two) times daily with a meal. 60 tablet 0  . cinacalcet (SENSIPAR) 90 MG tablet Take 60 mg by mouth daily.     . cyclobenzaprine (FLEXERIL) 5 MG tablet Take 5 mg by mouth daily.    . furosemide (LASIX)  40 MG tablet Take 1 tablet (40 mg total) by mouth 2 (two) times daily. 60 tablet 1  . hydrALAZINE (APRESOLINE) 50 MG tablet Take 100 mg by mouth 3 (three) times daily.     [name] ipratropium (ATROVENT HFA) 17 MCG/ACT inhaler Inhale 2 puffs into the lungs 4 (four) times daily as needed for wheezing.     . isosorbide mononitrate (IMDUR) 30 MG 24 hr tablet Take 1 tablet (30 mg total) by mouth daily. 30 tablet 2  . lidocaine-prilocaine (EMLA) cream Apply 1 application topically as needed (dialysis access).    . nitroGLYCERIN (NITROSTAT) 0.4 MG SL tablet Place 0.4 mg under the tongue every 5 (five) minutes as needed for chest pain.    [name] omeprazole (PRILOSEC) 40 MG capsule Take 40 mg by mouth every morning.     [name] oxyCODONE-acetaminophen (PERCOCET) 5-325 MG tablet Take 1-2 tablets by mouth every 6 (six) hours as needed for moderate pain or severe pain. (Patient not taking: Reported on 02/08/2018) 50 tablet 0  . OXYGEN Inhale into the lungs daily as  needed. Wears O2 at 2L [place] at home at night and as needed in the day    . predniSONE (DELTASONE) 10 MG tablet Take 3 tablets once a day for 4 days (Patient not taking: Reported on 02/08/2018) 12 tablet 0  . sacubitril-valsartan (ENTRESTO) 24-26 MG Take 1 tablet by mouth 2 (two) times daily. 60 tablet 3    Results for orders placed or performed during the hospital encounter of 04/17/18 (from the past 48 hour(s))  Comprehensive metabolic panel     Status: Abnormal   Collection Time: 04/17/18  7:32 PM  Result Value Ref Range   Sodium 141 135 - 145 mmol/L   Potassium 4.8 3.5 - 5.1 mmol/L   Chloride 107 98 - 111 mmol/L   CO2 26 22 - 32 mmol/L   Glucose, Bld 63 (L) 70 - 99 mg/dL   BUN 83 (H) 8 - 23 mg/dL   Creatinine, Ser 4.28 (H) 0.61 - 1.24 mg/dL   Calcium 8.1 (L) 8.9 - 10.3 mg/dL   Total Protein 5.9 (L) 6.5 - 8.1 g/dL   Albumin 2.8 (L) 3.5 - 5.0 g/dL   AST 25 15 - 41 U/L   ALT 18 0 - 44 U/L   Alkaline Phosphatase 80 38 - 126 U/L   Total Bilirubin 1.2 0.3 - 1.2 mg/dL   GFR calc non Af Amer 14 (L) >60 mL/min   GFR calc Af Amer 16 (L) >60 mL/min   Anion gap 8 5 - 15    Comment: Performed at [facility], [address] [place], [place] [address]  CBC     Status: Abnormal   Collection Time: 04/17/18  7:32 PM  Result Value Ref Range   WBC 9.2 4.0 - 10.5 K/uL   RBC 2.86 (L) 4.22 - 5.81 MIL/uL   Hemoglobin 8.5 (L) 13.0 - 17.0 g/dL   HCT 27.3 (L) 39.0 - 52.0 %   MCV 95.5 80.0 - 100.0 fL   MCH 29.7 26.0 - 34.0 pg   MCHC 31.1 30.0 - 36.0 g/dL   RDW 17.3 (H) 11.5 - 15.5 %   Platelets 153 150 - 400 K/uL   nRBC 0.3 (H) 0.0 - 0.2 %    Comment: Performed at [facility], [address] [place], [place] [address]  Type and screen Elias-Fela Solis     Status: None (Preliminary result)   Collection Time: 04/17/18  7:32 PM  Result Value Ref Range   ABO/RH(D) O POS    Antibody Screen NEG    Sample Expiration 04/20/2018    Unit Number [id number]     Blood Component Type RED CELLS,LR    Unit division 00    Status of Unit ISSUED    Transfusion Status OK TO TRANSFUSE    Crossmatch Result      Compatible Performed at [facility], [address], [place], [place] [address]    Unit Number [id number]    Blood Component Type RED CELLS,LR    Unit division 00    Status of Unit ALLOCATED    Transfusion Status OK TO TRANSFUSE    Crossmatch Result Compatible    Unit Number [id number]    Blood Component Type RED CELLS,LR    Unit division 00    Status of Unit ALLOCATED    Transfusion Status OK TO TRANSFUSE    Crossmatch Result Compatible   Prepare RBC     Status: None   Collection Time: 04/18/18 12:08 AM  Result Value Ref Range   Order Confirmation      ORDER PROCESSED BY BLOOD BANK Performed at [facility], [address] [place], [place] [address]    No results found.  Review of Systems  Constitutional: Negative for chills and fever.  HENT: Positive for nosebleeds (???). Negative for sore throat and tinnitus.   Eyes: Negative for blurred vision and redness.  Respiratory: Negative for cough and shortness of breath.   Cardiovascular: Negative for chest pain, palpitations, orthopnea and PND.  Gastrointestinal: Positive for melena. Negative for abdominal pain, diarrhea, nausea and vomiting.  Genitourinary: Negative for dysuria, frequency and urgency.  Musculoskeletal: Negative for joint pain and myalgias.  Skin: Negative for rash.       No lesions  Neurological: Negative for speech change, focal weakness and weakness.  Endo/Heme/Allergies: Does not bruise/bleed easily.       No temperature intolerance  Psychiatric/Behavioral: Negative for depression and suicidal ideas.    Blood pressure (!) 95/56, pulse 83, temperature 98.1 F (36.7 C), temperature source Oral, resp. rate 16, height 5\' 8"  (1.727 m), weight 78.5 kg, SpO2 100 %. Physical Exam  Vitals reviewed. Constitutional: He is oriented to person,  place, and time. He appears well-developed and well-nourished. No distress.  HENT:  Head: Normocephalic and atraumatic.  Mouth/Throat: Oropharynx is clear and moist.  Eyes: Pupils are equal, round, and reactive to light. Conjunctivae and EOM are normal. No scleral icterus.  Neck: Normal range of motion. Neck supple. No JVD present. No tracheal deviation present. No thyromegaly present.  Cardiovascular: Normal rate and regular rhythm. Exam reveals no gallop and no friction rub.  No murmur heard. Respiratory: Effort normal and breath sounds normal. No respiratory distress.  GI: Soft. Bowel sounds are normal. He exhibits no distension. There is no abdominal tenderness.  Genitourinary:    Genitourinary Comments: Deferred   Musculoskeletal: Normal range of motion.        General: No edema.  Lymphadenopathy:    He has no cervical adenopathy.  Neurological: He is alert and oriented to person, place, and time. No cranial nerve deficit.  Skin: Skin is warm and dry. No rash noted. No erythema.  Psychiatric:  Difficult to assess mental status as the patient will not talk with me     Assessment/Plan This is a 63 year old male admitted for GI bleed. 1.  GI bleed: Melena; hemodynamically stable.  Receiving blood transfusion.  Maintain  2 large-bore IVs.  IV Protonix every 12 hours.  Consult gastroenterology. 2.  CAD: Stable; hold aspirin and nitrates for now.  Monitor telemetry. 3.  Hypertension: Controlled; also hold antihypertensive medication for now 4.  ESRD: On hemodialysis.  Consult nephrology for continuation of dialysis.  Hold Sensipar and PhosLo until the patient resumes a diet 5.  CHF: Combined systolic and diastolic; stable.  Continue Entresto.  Lasix as needed. 6.  DVT prophylaxis: SCDs 7.  GI prophylaxis: As above The patient is a full code.  Time spent on admission orders and patient care approximately 45 minutes  Harrie Foreman, MD 04/18/2018, 3:29 AM

## 2018-04-18 NOTE — Progress Notes (Signed)
Pre HD Tx    04/18/18 1400  Vital Signs  Temp 98 F (36.7 C)  Temp Source Oral  Pulse Rate 74  Pulse Rate Source Monitor  Resp 18  BP 114/74  BP Location Right Arm  BP Method Automatic  Patient Position (if appropriate) Lying  Oxygen Therapy  SpO2 100 %  O2 Device Nasal Cannula  O2 Flow Rate (L/min) 2 L/min  Pulse Oximetry Type Continuous  Pain Assessment  Pain Scale 0-10  Pain Score 0  Dialysis Weight  Weight 60.6 kg  Type of Weight Pre-Dialysis  Time-Out for Hemodialysis  What Procedure? HD  Pt Identifiers(min of two) First/Last Name;MRN/Account#  Correct Site? Yes  Correct Side? Yes  Correct Procedure? Yes  Consents Verified? Yes  Rad Studies Available? N/A  Engineer, civil (consulting) Number 5  Station Number 3  UF/Alarm Test Passed  Conductivity: Meter 13.8  Conductivity: Machine  13.9  pH 7.4  Reverse Osmosis Main  Normal Saline Lot Number [id number]  Dialyzer Lot Number [id number]  Disposable Set Lot Number [id number]  Machine Temperature 98.6 F (37 C)  Musician and Audible Yes  Blood Lines Intact and Secured Yes  Pre Treatment Patient Checks  Vascular access used during treatment Fistula  Hemodialysis Consent Verified Yes  Hemodialysis Standing Orders Initiated Yes  ECG (Telemetry) Monitor On Yes  Prime Ordered Normal Saline  Length of  DialysisTreatment -hour(s) 3.5 Hour(s)  Dialysis Treatment Comments Na 140  Dialyzer Elisio 17H NR  Dialysate 2K, 2.5 Ca  Dialysis Anticoagulant None  Dialysate Flow Ordered 800  Blood Flow Rate Ordered 400 mL/min  Ultrafiltration Goal 0.5 Liters  Blood Products Ordered Packed Red Blood Cells  Dialysis Blood Pressure Support Ordered Normal Saline  Education / Care Plan  Dialysis Education Provided Yes  Documented Education in Care Plan Yes  Fistula / Graft Left Forearm Arteriovenous fistula  Placement Date: 09/20/16   Placed prior to admission: Yes  Orientation: Left  Access Location: Forearm  Access Type:  Arteriovenous fistula  Site Condition No complications  Fistula / Graft Assessment Present;Thrill;Bruit  Status Patent  Drainage Description None

## 2018-04-18 NOTE — Care Management (Signed)
[name] dialysis liaison notified of admission.    

## 2018-04-18 NOTE — Anesthesia Preprocedure Evaluation (Deleted)
Anesthesia Evaluation  Patient identified by MRN, date of birth, ID band Patient awake    Reviewed: Allergy & Precautions, H&P , NPO status , Patient's Chart, lab work & pertinent test results, reviewed documented beta blocker date and time   Airway Mallampati: II   Neck ROM: full    Dental  (+) Poor Dentition   Pulmonary neg pulmonary ROS, shortness of breath, pneumonia, former smoker,    Pulmonary exam normal        Cardiovascular Exercise Tolerance: Poor hypertension, On Medications + CAD, + Past MI, + Peripheral Vascular Disease and +CHF  negative cardio ROS Normal cardiovascular exam Rhythm:regular Rate:Normal     Neuro/Psych negative neurological ROS  negative psych ROS   GI/Hepatic negative GI ROS, Neg liver ROS, (+) Hepatitis -  Endo/Other  negative endocrine ROSdiabetes, Poorly Controlled  Renal/GU Renal diseasenegative Renal ROS  negative genitourinary   Musculoskeletal   Abdominal   Peds  Hematology negative hematology ROS (+)   Anesthesia Other Findings Past Medical History: No date: Arthritis No date: CAD (coronary artery disease)     Comment:  a. 09/2013 Myoview [facility]): basal inf defect more               pronounced @ rest, likely artifact, EF 48%, low risk               study; b. 06/2016 Cath [facility]): LAD 10p, 30-47m, 40-50d, D1               90(small), D2 40, D3 60-70, D4 30, LCX 10p, 35m, 20d, Om1              30, OM3 40, RCA 100p w/ bridging collats, m/d RCA fill               via collats, small/mod caliber-->Med Rx; c. 02/2017 MV:               inf infarct w/ peri-inf ischemia. EF <30%.  No date: Chronic combined systolic and diastolic CHF (congestive  heart failure) ([place])     Comment:  a. 10/2013 Echo [facility]): EF 50%, diast dysfxn;  b. 10/2014               Echo: EF 30-35%, diff HK, gr1 DD;  c. 04/2016 Echo: EF               20-25%, sev dil LV, diff HK, gr3 DD; d. 06/2016 Echo: EF   30-35%, Gr2 DD. No date: Diabetes ([place]) No date: Dyspnea No date: ESRD (end stage renal disease) ([place])     Comment:  a. MWF dialysis @ [facility]. No date: Hepatitis     Comment:  Patient is unsure type of Hepatitis he has No date: Hypertension No date: Hypertensive heart disease with CHF (congestive heart  failure) ([place]) No date: Mixed Ischemic and non-ischemic cardiomyopathy     Comment:  a. 10/2013 Echo [facility]): EF 50%, diast dysfxn;  b. 10/2014               Echo: EF 30-35%, diff HK, gr1 DD;  c. 04/2016 Echo: EF               20-25%, sev dil LV, diff HK, gr3 DD; d. 06/2016 Echo: EF               30-35%, Gr2 DD. No date: Moderate Aortic insufficiency     Comment:  a. 04/2016 Echo: Mod AI. No date: Myocardial infarction ([place]) No  date: Peripheral vascular disease ([place]) No date: Pulmonary hypertension ([place])     Comment:  a. 10/2014 Echo: Mod-Sev PAH. No date: PVC's (premature ventricular contractions)     Comment:  a. 02/2017 24 hr holter: freq polymorphic PVCs w/ total               of 38K beats in 24 hrs (34% burden). Past Surgical History: 04/06/2016: A/V FISTULAGRAM; Left     Comment:  Procedure: A/V Fistulagram;  Surgeon: Katha Cabal,              MD;  Location: [place] [facility];  Service:               Cardiovascular;  Laterality: Left; 01/19/2017: A/V FISTULAGRAM; Left     Comment:  Procedure: A/V FISTULAGRAM;  Surgeon: Katha Cabal, MD;  Location: [place] [facility];  Service:               Cardiovascular;  Laterality: Left; 04/06/2016: A/V SHUNT INTERVENTION; N/A     Comment:  Procedure: A/V Shunt Intervention;  Surgeon: Katha Cabal, MD;  Location: [place] [facility];  Service:               Cardiovascular;  Laterality: N/A; 06/29/2016: A/V SHUNT INTERVENTION; N/A     Comment:  Procedure: A/V Shunt Intervention;  Surgeon: Katha Cabal, MD;  Location: [place] [facility];  Service:               Cardiovascular;  Laterality: N/A; 2014: AV FISTULA PLACEMENT; Left 06/01/2017: AV FISTULA PLACEMENT; Left     Comment:  Procedure: INSERTION OF ARTERIOVENOUS (AV) GORE-TEX               GRAFT ARM ( BRACHIAL AXILLARY );  Surgeon: Katha Cabal, MD;  Location: [facility]S;  Service: Vascular;                Laterality: Left; No date: DIALYSIS/PERMA CATHETER INSERTION; Right     Comment:  Dr. Delana Meyer 07/05/2017: DIALYSIS/PERMA CATHETER REMOVAL; N/A     Comment:  Procedure: DIALYSIS/PERMA CATHETER REMOVAL;  Surgeon:               Katha Cabal, MD;  Location: [place] [facility];               Service: Cardiovascular;  Laterality: N/A; 10/03/2014: PERIPHERAL VASCULAR CATHETERIZATION; N/A     Comment:  Procedure: A/V Shuntogram/Fistulagram;  Surgeon: Algernon Huxley, MD;  Location: [place] [facility];  Service:               Cardiovascular;  Laterality: N/A; 10/03/2014: PERIPHERAL VASCULAR CATHETERIZATION; Left     Comment:  Procedure: A/V Shunt Intervention;  Surgeon: Algernon Huxley, MD;  Location: [place] [facility];  Service:               Cardiovascular;  Laterality: Left; 05/01/2015: PERIPHERAL VASCULAR CATHETERIZATION; Left     Comment:  Procedure: A/V  Shuntogram/Fistulagram;  Surgeon: Algernon Huxley, MD;  Location: [place] [facility];  Service:               Cardiovascular;  Laterality: Left; 05/01/2015: PERIPHERAL VASCULAR CATHETERIZATION; N/A     Comment:  Procedure: A/V Shunt Intervention;  Surgeon: Algernon Huxley, MD;  Location: [place] [facility];  Service:               Cardiovascular;  Laterality: N/A; 06/29/2016: UPPER EXTREMITY ANGIOGRAPHY; Bilateral     Comment:  Procedure: Upper Extremity Angiography;  Surgeon:               Katha Cabal, MD;  Location: [place] [facility];               Service: Cardiovascular;  Laterality: Bilateral; 06/29/2016: UPPER EXTREMITY INTERVENTION     Comment:  Procedure:  Upper Extremity Intervention;  Surgeon:               Katha Cabal, MD;  Location: [place] [facility];               Service: Cardiovascular;; 03/15/2017: UPPER EXTREMITY VENOGRAPHY; Left     Comment:  Procedure: UPPER EXTREMITY VENOGRAPHY;  Surgeon:               Katha Cabal, MD;  Location: [place] [facility];               Service: Cardiovascular;  Laterality: Left; BMI    Body Mass Index:  20.31 kg/m     Reproductive/Obstetrics negative OB ROS                             Anesthesia Physical Anesthesia Plan  ASA: III and emergent  Anesthesia Plan: General   Post-op Pain Management:    Induction:   PONV Risk Score and Plan:   Airway Management Planned:   Additional Equipment:   Intra-op Plan:   Post-operative Plan:   Informed Consent: I have reviewed the patients History and Physical, chart, labs and discussed the procedure including the risks, benefits and alternatives for the proposed anesthesia with the patient or authorized representative who has indicated his/her understanding and acceptance.     Dental Advisory Given  Plan Discussed with: CRNA  Anesthesia Plan Comments:         Anesthesia Quick Evaluation

## 2018-04-18 NOTE — ED Provider Notes (Signed)
[facility] Emergency Department Provider Note  ____________________________________________   First MD Initiated Contact with Patient 04/17/18 2337     (approximate)  I have reviewed the triage vital signs and the nursing notes.   HISTORY  Chief Complaint Rectal Bleeding and Epistaxis    HPI [name] is a 63 y.o. male with extensive medical history that includes end-stage renal disease on hemodialysis Mondays, Wednesdays, and Fridays (he received dialysis today) who presents for evaluation of acute onset and severe dark blood per rectum.  He reports that it started this evening after he was home from dialysis.  He feels weak and tired.  He has not noticed this happening to him in the past.  He denies abdominal pain.  He also denies fever/chills, chest pain, nausea, vomiting.  He states he always has some shortness of breath and uses "an oxygen machine" at night.  He feels like he needs some oxygen now although his SPO2 is 100%.  He denies cough and recent nasal congestion or runny nose and has had no sore throat.  He reports that the rectal bleeding is associated with an urgency to have a bowel movement but it is "all blood".  He has had blood transfusions in the past but not for a few years.  He does not remember why he needed them.  Usually he says is blood level is normal.      Past Medical History:  Diagnosis Date  . Arthritis   . CAD (coronary artery disease)    a. 09/2013 Myoview [facility]): basal inf defect more pronounced @ rest, likely artifact, EF 48%, low risk study; b. 06/2016 Cath [facility]): LAD 10p, 30-57m, 40-50d, D1 90(small), D2 40, D3 60-70, D4 30, LCX 10p, 36m, 20d, Om1 30, OM3 40, RCA 100p w/ bridging collats, m/d RCA fill via collats, small/mod caliber-->Med Rx; c. 02/2017 MV: inf infarct w/ peri-inf ischemia. EF <30%.   . Chronic combined systolic and diastolic CHF (congestive heart failure) ([place])    a. 10/2013 Echo [facility]): EF 50%, diast dysfxn;   b. 10/2014 Echo: EF 30-35%, diff HK, gr1 DD;  c. 04/2016 Echo: EF 20-25%, sev dil LV, diff HK, gr3 DD; d. 06/2016 Echo: EF 30-35%, Gr2 DD.  . Diabetes ([place])   . Dyspnea   . ESRD (end stage renal disease) ([place])    a. MWF dialysis @ [facility].  . Hepatitis    Patient is unsure type of Hepatitis he has  . Hypertension   . Hypertensive heart disease with CHF (congestive heart failure) ([place])   . Mixed Ischemic and non-ischemic cardiomyopathy    a. 10/2013 [facility]): EF 50%, diast dysfxn;  b. 10/2014 Echo: EF 30-35%, diff HK, gr1 DD;  c. 04/2016 Echo: EF 20-25%, sev dil LV, diff HK, gr3 DD; d. 06/2016 Echo: EF 30-35%, Gr2 DD.  [name] Moderate Aortic insufficiency    a. 04/2016 Echo: Mod AI.  [name] Myocardial infarction ([place])   . Peripheral vascular disease ([place])   . Pulmonary hypertension ([place])    a. 10/2014 Echo: Mod-Sev PAH.  [name] PVC's (premature ventricular contractions)    a. 02/2017 24 hr holter: freq polymorphic PVCs w/ total of 38K beats in 24 hrs (34% burden).    Patient Active Problem List   Diagnosis Date Noted  . Tachycardia 03/13/2017  . Acute respiratory failure with hypoxia ([place]) 02/15/2017  . Chronic systolic heart failure ([place]) 09/08/2016  . 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 Hyperkalemia 07/04/2016  . Abnormal  EKG 07/04/2016  . Elevated troponin   . Chest pain 05/22/2016  . Sepsis ([place]) 02/26/2016  . HCAP (healthcare-associated pneumonia) 02/26/2016  . Diabetes ([place]) 02/26/2016  . Community acquired pneumonia 01/07/2016  . Renal dialysis device, implant, or graft complication 16/11/9602  . Tobacco use 09/04/2015  . ESRD (end stage renal disease) on dialysis ([place]) 08/02/2015  . HTN (hypertension) 08/02/2015  . Pulmonary edema 12/02/2014    Past Surgical History:  Procedure Laterality Date  . A/V FISTULAGRAM Left 04/06/2016   Procedure: A/V Fistulagram;  Surgeon: Katha Cabal, MD;  Location: [place] [facility];  Service: Cardiovascular;  Laterality: Left;  . A/V  FISTULAGRAM Left 01/19/2017   Procedure: A/V FISTULAGRAM;  Surgeon: Katha Cabal, MD;  Location: [place] [facility];  Service: Cardiovascular;  Laterality: Left;  . A/V SHUNT INTERVENTION N/A 04/06/2016   Procedure: A/V Shunt Intervention;  Surgeon: Katha Cabal, MD;  Location: [place] [facility];  Service: Cardiovascular;  Laterality: N/A;  . A/V SHUNT INTERVENTION N/A 06/29/2016   Procedure: A/V Shunt Intervention;  Surgeon: Katha Cabal, MD;  Location: [place] [facility];  Service: Cardiovascular;  Laterality: N/A;  . AV FISTULA PLACEMENT Left 2014  . AV FISTULA PLACEMENT Left 06/01/2017   Procedure: INSERTION OF ARTERIOVENOUS (AV) GORE-TEX GRAFT ARM ( BRACHIAL AXILLARY );  Surgeon: Katha Cabal, MD;  Location: [facility];  Service: Vascular;  Laterality: Left;  . DIALYSIS/PERMA CATHETER INSERTION Right    Dr. Delana Meyer  . DIALYSIS/PERMA CATHETER REMOVAL N/A 07/05/2017   Procedure: DIALYSIS/PERMA CATHETER REMOVAL;  Surgeon: Katha Cabal, MD;  Location: [place] [facility];  Service: Cardiovascular;  Laterality: N/A;  . PERIPHERAL VASCULAR CATHETERIZATION N/A 10/03/2014   Procedure: A/V Shuntogram/Fistulagram;  Surgeon: Algernon Huxley, MD;  Location: [place] [facility];  Service: Cardiovascular;  Laterality: N/A;  . PERIPHERAL VASCULAR CATHETERIZATION Left 10/03/2014   Procedure: A/V Shunt Intervention;  Surgeon: Algernon Huxley, MD;  Location: [place] [facility];  Service: Cardiovascular;  Laterality: Left;  . PERIPHERAL VASCULAR CATHETERIZATION Left 05/01/2015   Procedure: A/V Shuntogram/Fistulagram;  Surgeon: Algernon Huxley, MD;  Location: [place] [facility];  Service: Cardiovascular;  Laterality: Left;  . PERIPHERAL VASCULAR CATHETERIZATION N/A 05/01/2015   Procedure: A/V Shunt Intervention;  Surgeon: Algernon Huxley, MD;  Location: [place] [facility];  Service: Cardiovascular;  Laterality: N/A;  . UPPER EXTREMITY ANGIOGRAPHY Bilateral 06/29/2016   Procedure: Upper Extremity  Angiography;  Surgeon: Katha Cabal, MD;  Location: [place] [facility];  Service: Cardiovascular;  Laterality: Bilateral;  . UPPER EXTREMITY INTERVENTION  06/29/2016   Procedure: Upper Extremity Intervention;  Surgeon: Katha Cabal, MD;  Location: [place] [facility];  Service: Cardiovascular;;  . UPPER EXTREMITY VENOGRAPHY Left 03/15/2017   Procedure: UPPER EXTREMITY VENOGRAPHY;  Surgeon: Katha Cabal, MD;  Location: [place] [facility];  Service: Cardiovascular;  Laterality: Left;    Prior to Admission medications   Medication Sig Start Date End Date Taking? Authorizing Provider  acetaminophen (TYLENOL) 325 MG tablet Take 2 tablets (650 mg total) by mouth every 6 (six) hours as needed for mild pain (or Fever >/= 101). Patient taking differently: Take 500 mg by mouth every 8 (eight) hours as needed for mild pain (or Fever >/= 101).  09/21/16  Yes Gouru, Aruna, MD  amiodarone (PACERONE) 200 MG tablet Take 1 tablet (200 mg total) by mouth daily. 04/06/17  Yes Theora Gianotti, NP  atorvastatin (LIPITOR) 40 MG  tablet Take 1 tablet (40 mg total) by mouth daily. Patient taking differently: Take 40 mg by mouth every evening.  02/19/17  Yes Demetrios Loll, MD  albuterol (PROVENTIL HFA;VENTOLIN HFA) 108 (90 Base) MCG/ACT inhaler Inhale 2 puffs into the lungs every 4 (four) hours as needed for wheezing or shortness of breath.     [provider]  aspirin 81 MG chewable tablet Chew 81 mg by mouth daily. 07/21/16   [provider]  calcium acetate (PHOSLO) 667 MG capsule Take 1,334-2,001 mg by mouth 3 (three) times daily with meals. take 3 capsules (2001mg ) by mouth three times daily before each meal, and 2 capsules (667mg ) by mouth with snacks twice daily 12/11/15   [provider]  carvedilol (COREG) 6.25 MG tablet Take 1 tablet (6.25 mg total) by mouth 2 (two) times daily with a meal. 09/21/16   Gouru, Aruna, MD  cinacalcet (SENSIPAR) 90 MG tablet Take 60 mg  by mouth daily.     [provider]  furosemide (LASIX) 40 MG tablet Take 1 tablet (40 mg total) by mouth 2 (two) times daily. 02/18/17   Demetrios Loll, MD  hydrALAZINE (APRESOLINE) 50 MG tablet Take 100 mg by mouth 3 (three) times daily.  07/01/16   [provider]  ipratropium (ATROVENT HFA) 17 MCG/ACT inhaler Inhale 2 puffs into the lungs 4 (four) times daily as needed for wheezing.     [provider]  isosorbide mononitrate (IMDUR) 30 MG 24 hr tablet Take 1 tablet (30 mg total) by mouth daily. 08/24/16 02/17/18  Dustin Flock, MD  lidocaine-prilocaine (EMLA) cream Apply 1 application topically as needed (dialysis access).    [provider]  nitroGLYCERIN (NITROSTAT) 0.4 MG SL tablet Place 0.4 mg under the tongue every 5 (five) minutes as needed for chest pain.    [provider]  omeprazole (PRILOSEC) 40 MG capsule Take 40 mg by mouth every morning.  05/11/17   [provider]  oxyCODONE-acetaminophen (PERCOCET) 5-325 MG tablet Take 1-2 tablets by mouth every 6 (six) hours as needed for moderate pain or severe pain. Patient not taking: Reported on 02/08/2018 06/01/17 06/01/18  Schnier, Dolores Lory, MD  OXYGEN Inhale into the lungs daily as needed. Wears O2 at 2L [place] at home at night and as needed in the day    [provider]  predniSONE (DELTASONE) 10 MG tablet Take 3 tablets once a day for 4 days Patient not taking: Reported on 02/08/2018 01/30/18   Johnn Hai, PA-C  sacubitril-valsartan (ENTRESTO) 24-26 MG Take 1 tablet by mouth 2 (two) times daily. 03/16/17   Theora Gianotti, NP    Allergies Sulfa antibiotics  Family History  Problem Relation Age of Onset  . Hypertension Son   . Diabetes Son   . Hypertension Mother   . Diabetes Mother   . Diabetes Sister     Social History Social History   Tobacco Use  . Smoking status: Former Smoker    Packs/day: 0.25    Years: 40.00    Pack years: 10.00    Types:  Cigarettes    Last attempt to quit: 10/12/2016    Years since quitting: 1.5  . Smokeless tobacco: Never Used  Substance Use Topics  . Alcohol use: No    Alcohol/week: 0.0 standard drinks  . Drug use: Yes    Types: Cocaine, Marijuana    Comment: patient states no drugs    Review of Systems Constitutional: No fever/chills.  Generalized fatigue and  weakness. Eyes: No visual changes. ENT: No sore throat. Cardiovascular: Denies chest pain. Respiratory: Denies shortness of breath. Gastrointestinal: Acute dark blood per rectum as described above.  No abdominal pain.  No nausea, no vomiting.   Genitourinary: Negative for dysuria. Musculoskeletal: Negative for neck pain.  Negative for back pain. Integumentary: Negative for rash. Neurological: Negative for headaches, focal weakness or numbness.   ____________________________________________   PHYSICAL EXAM:  VITAL SIGNS: ED Triage Vitals  Enc Vitals Group     BP 04/17/18 1930 (!) 123/59     Pulse Rate 04/17/18 1930 86     Resp 04/17/18 1930 18     Temp 04/17/18 1930 98.6 F (37 C)     Temp Source 04/17/18 1930 Oral     SpO2 04/17/18 1930 99 %     Weight 04/17/18 1928 78.5 kg (173 lb)     Height 04/17/18 1928 1.727 m (5\' 8" )     Head Circumference --      Peak Flow --      Pain Score 04/17/18 1928 0     Pain Loc --      Pain Edu? --      Excl. in [place]? --     Constitutional: Alert and oriented.  Appears chronically ill but is not in acute distress at this time. Eyes: Conjunctivae are normal.  Head: Atraumatic. Nose: No congestion/rhinnorhea. Mouth/Throat: Mucous membranes are dry. Neck: No stridor.  No meningeal signs.   Cardiovascular: Normal rate, regular rhythm. Good peripheral circulation. Grossly normal heart sounds. Respiratory: Normal respiratory effort.  No retractions. Lungs CTAB. Gastrointestinal: Soft and nontender. No distention.  Rectal: Dried blood around the patient's anus.  On digital rectal exam he has  melena.  No active bleeding from the rectum at this time but the stool in the rectal vault is quite clearly melena, likely with some hematochezia mixed in.  ED chaperone present throughout exam. Musculoskeletal: No lower extremity tenderness nor edema. No gross deformities of extremities. Neurologic:  Normal speech and language. No gross focal neurologic deficits are appreciated.  Skin:  Skin is warm, dry and intact. No rash noted. Psychiatric: Mood and affect are normal. Speech and behavior are normal.  ____________________________________________   LABS (all labs ordered are listed, but only abnormal results are displayed)  Labs Reviewed  COMPREHENSIVE METABOLIC PANEL - Abnormal; Notable for the following components:      Result Value   Glucose, Bld 63 (*)    BUN 83 (*)    Creatinine, Ser 4.28 (*)    Calcium 8.1 (*)    Total Protein 5.9 (*)    Albumin 2.8 (*)    GFR calc non Af Amer 14 (*)    GFR calc Af Amer 16 (*)    All other components within normal limits  CBC - Abnormal; Notable for the following components:   RBC 2.86 (*)    Hemoglobin 8.5 (*)    HCT 27.3 (*)    RDW 17.3 (*)    nRBC 0.3 (*)    All other components within normal limits  POC OCCULT BLOOD, ED  TYPE AND SCREEN  PREPARE RBC (CROSSMATCH)   ____________________________________________  EKG  ED ECG REPORT I, Hinda Kehr, the attending physician, personally viewed and interpreted this ECG.  Date: 04/18/2018 EKG Time: 00: 49 Rate: 82 Rhythm: normal sinus rhythm QRS Axis: normal Intervals: LVH, borderline prolonged QTC at 493 ms ST/T Wave abnormalities: Non-specific ST segment / T-wave changes, but no clear evidence of acute  ischemia. Narrative Interpretation: no definitive evidence of acute ischemia; does not meet STEMI criteria.   ____________________________________________  RADIOLOGY   ED MD interpretation: No indication for emergent imaging  Official radiology report(s): No results  found.  ____________________________________________   PROCEDURES   Procedure(s) performed (including Critical Care):  .Critical Care Performed by: Hinda Kehr, MD Authorized by: Hinda Kehr, MD   Critical care provider statement:    Critical care time (minutes):  30   Critical care time was exclusive of:  Separately billable procedures and treating other patients   Critical care was necessary to treat or prevent imminent or life-threatening deterioration of the following conditions: acute GI bleeding with symptomatic anemia requiring transfusion.   Critical care was time spent personally by me on the following activities:  Development of treatment plan with patient or surrogate, discussions with consultants, evaluation of patient's response to treatment, examination of patient, obtaining history from patient or surrogate, ordering and performing treatments and interventions, ordering and review of laboratory studies, ordering and review of radiographic studies, pulse oximetry, re-evaluation of patient's condition and review of old charts     ____________________________________________   [place] / MDM / [place] / ED COURSE  As part of my medical decision making, I reviewed the following data within the [place] notes reviewed and incorporated, Labs reviewed , EKG interpreted , Old chart reviewed, Discussed with admitting physician (Dr. Jannifer Franklin with the hospitalist service) and Notes from prior ED visits       Differential diagnosis includes, but is not limited to, diverticular bleed, neoplasm, AV malformation, upper GI bleed.  The patient is in no distress although he does feel tired and weak consistent with symptomatic anemia.  His labs indicate that he normally has a hemoglobin within normal limits and his last hemoglobin on record was greater than 14 approximately 2 months ago.  I suspect he has had a slow bleed or an  intermittent bleed that he did not notice since that time because today his hemoglobin is 8.5 and I doubt he has dropped almost 6 points since his symptoms started a few hours ago.  However he is definitely having acute and severe rectal bleeding at this time consistent with both melena and a degree of hematochezia.  He is hypertensive but I suspect this is also due to his dialysis today.  I had my risks and benefits discussion with him regarding blood transfusion and I have ordered 1 unit to be transfused over 4 hours (given that he is a dialysis patient) and another 2 units to be prepared in advance.  He will need admission and further GI evaluation to determine the source of the bleeding.  He is currently hemodynamically stable.  No indication for emergent imaging in the emergency department given that his CT scan will be very unlikely to identify the source of the bleeding.  At approximately midnight I discussed the case in person with the hospitalist, Dr. Jannifer Franklin, who will discuss the case with his partner Dr. Marcille Blanco for admission.  The patient understands the plan and agrees.     ____________________________________________  FINAL CLINICAL IMPRESSION(S) / ED DIAGNOSES  Final diagnoses:  Melena  Symptomatic anemia  ESRD on hemodialysis (HCC)  Hypotension, unspecified hypotension type     MEDICATIONS GIVEN DURING THIS VISIT:  Medications  labetalol (NORMODYNE,TRANDATE) 5 MG/ML injection (has no administration in time range)  0.9 %  sodium chloride infusion (has no administration in time range)  ED Discharge Orders    None       Note:  This document was prepared using Dragon voice recognition software and may include unintentional dictation errors.   Hinda Kehr, MD 04/18/18 [phone]

## 2018-04-18 NOTE — Consult Note (Signed)
[name] , MD [address] [place], [place], [place], [address] [id number] [address] [place], [place], [place], [address] Phone: [phone]  Fax: [phone]  Consultation  Referring Provider:    Dr Marcille Blanco Primary Care Physician:  Donnie Coffin, MD Primary Gastroenterologist:  None          Reason for Consultation:     GI bleed   Date of Admission:  04/17/2018 Date of Consultation:  04/18/2018         HPI:   [name] is a 63 y.o. male with a history of ESRD presented to the ER with rectal bleeding. Suffers from moderate to severe pulmonary HTN. No admission HNP in Epic .  2 months back Hb 14 grams and on admission was 8.5 grams dropped to 7.3 grams this morning.  Patient is a poor historian and was very drowsy when I went to see him I had to wake him up patently to get a good history from him.  He denies any prior episodes of GI bleeding, denies any NSAID use or blood thinner usage.  He states that he has not had any hematemesis but has been passing bloody stools for the past 1 day and the last episode was very early this morning.  He denies any abdominal pains.  Past Medical History:  Diagnosis Date  . Arthritis   . CAD (coronary artery disease)    a. 09/2013 Myoview [facility]): basal inf defect more pronounced @ rest, likely artifact, EF 48%, low risk study; b. 06/2016 Cath [facility]): LAD 10p, 30-40m, 40-50d, D1 90(small), D2 40, D3 60-70, D4 30, LCX 10p, 32m, 20d, Om1 30, OM3 40, RCA 100p w/ bridging collats, m/d RCA fill via collats, small/mod caliber-->Med Rx; c. 02/2017 MV: inf infarct w/ peri-inf ischemia. EF <30%.   . Chronic combined systolic and diastolic CHF (congestive heart failure) ([place])    a. 10/2013 Echo [facility]): EF 50%, diast dysfxn;  b. 10/2014 Echo: EF 30-35%, diff HK, gr1 DD;  c. 04/2016 Echo: EF 20-25%, sev dil LV, diff HK, gr3 DD; d. 06/2016 Echo: EF 30-35%, Gr2 DD.  . Diabetes ([place])   . Dyspnea   . ESRD (end stage renal disease) ([place])    a. MWF dialysis @ [facility].  . Hepatitis    Patient is unsure type of Hepatitis he has  . Hypertension   . Hypertensive heart disease with CHF (congestive heart failure) ([place])   . Mixed Ischemic and non-ischemic cardiomyopathy    a. 10/2013 Echo [facility]): EF 50%, diast dysfxn;  b. 10/2014 Echo: EF 30-35%, diff HK, gr1 DD;  c. 04/2016 Echo: EF 20-25%, sev dil LV, diff HK, gr3 DD; d. 06/2016 Echo: EF 30-35%, Gr2 DD.  [name] Moderate Aortic insufficiency    a. 04/2016 Echo: Mod AI.  [name] Myocardial infarction ([place])   . Peripheral vascular disease ([place])   . Pulmonary hypertension ([place])    a. 10/2014 Echo: Mod-Sev PAH.  [name] PVC's (premature ventricular contractions)    a. 02/2017 24 hr holter: freq polymorphic PVCs w/ total of 38K beats in 24 hrs (34% burden).    Past Surgical History:  Procedure Laterality Date  . A/V FISTULAGRAM Left 04/06/2016   Procedure: A/V Fistulagram;  Surgeon: Katha Cabal, MD;  Location: [place] [facility];  Service: Cardiovascular;  Laterality: Left;  . A/V FISTULAGRAM Left 01/19/2017   Procedure: A/V FISTULAGRAM;  Surgeon: Katha Cabal, MD;  Location: [place] [facility];  Service: Cardiovascular;  Laterality: Left;  . A/V SHUNT INTERVENTION N/A 04/06/2016   Procedure: A/V Shunt Intervention;  Surgeon: Katha Cabal, MD;  Location: [place] [facility];  Service: Cardiovascular;  Laterality: N/A;  . A/V SHUNT INTERVENTION N/A 06/29/2016   Procedure: A/V Shunt Intervention;  Surgeon: Katha Cabal, MD;  Location: [place] [facility];  Service: Cardiovascular;  Laterality: N/A;  . AV FISTULA PLACEMENT Left 2014  . AV FISTULA PLACEMENT Left 06/01/2017   Procedure: INSERTION OF ARTERIOVENOUS (AV) GORE-TEX GRAFT ARM ( BRACHIAL AXILLARY );  Surgeon: Katha Cabal, MD;  Location: [facility];  Service: Vascular;  Laterality: Left;  . DIALYSIS/PERMA CATHETER INSERTION Right    Dr. Delana Meyer  . DIALYSIS/PERMA CATHETER REMOVAL N/A 07/05/2017   Procedure: DIALYSIS/PERMA CATHETER REMOVAL;   Surgeon: Katha Cabal, MD;  Location: [place] [facility];  Service: Cardiovascular;  Laterality: N/A;  . PERIPHERAL VASCULAR CATHETERIZATION N/A 10/03/2014   Procedure: A/V Shuntogram/Fistulagram;  Surgeon: Algernon Huxley, MD;  Location: [place] [facility];  Service: Cardiovascular;  Laterality: N/A;  . PERIPHERAL VASCULAR CATHETERIZATION Left 10/03/2014   Procedure: A/V Shunt Intervention;  Surgeon: Algernon Huxley, MD;  Location: [place] [facility];  Service: Cardiovascular;  Laterality: Left;  . PERIPHERAL VASCULAR CATHETERIZATION Left 05/01/2015   Procedure: A/V Shuntogram/Fistulagram;  Surgeon: Algernon Huxley, MD;  Location: [place] [facility];  Service: Cardiovascular;  Laterality: Left;  . PERIPHERAL VASCULAR CATHETERIZATION N/A 05/01/2015   Procedure: A/V Shunt Intervention;  Surgeon: Algernon Huxley, MD;  Location: [place] [facility];  Service: Cardiovascular;  Laterality: N/A;  . UPPER EXTREMITY ANGIOGRAPHY Bilateral 06/29/2016   Procedure: Upper Extremity Angiography;  Surgeon: Katha Cabal, MD;  Location: [place] [facility];  Service: Cardiovascular;  Laterality: Bilateral;  . UPPER EXTREMITY INTERVENTION  06/29/2016   Procedure: Upper Extremity Intervention;  Surgeon: Katha Cabal, MD;  Location: [place] [facility];  Service: Cardiovascular;;  . UPPER EXTREMITY VENOGRAPHY Left 03/15/2017   Procedure: UPPER EXTREMITY VENOGRAPHY;  Surgeon: Katha Cabal, MD;  Location: [place] [facility];  Service: Cardiovascular;  Laterality: Left;    Prior to Admission medications   Medication Sig Start Date End Date Taking? Authorizing Provider  acetaminophen (TYLENOL) 325 MG tablet Take 2 tablets (650 mg total) by mouth every 6 (six) hours as needed for mild pain (or Fever >/= 101). Patient taking differently: Take 500 mg by mouth every 8 (eight) hours as needed for mild pain (or Fever >/= 101).  09/21/16   Gouru, Aruna, MD  albuterol (PROVENTIL HFA;VENTOLIN HFA) 108 (90 Base) MCG/ACT  inhaler Inhale 2 puffs into the lungs every 4 (four) hours as needed for wheezing or shortness of breath.     [provider]  amiodarone (PACERONE) 200 MG tablet Take 1 tablet (200 mg total) by mouth daily. 04/06/17   Theora Gianotti, NP  aspirin 81 MG chewable tablet Chew 81 mg by mouth daily. 07/21/16   [provider]  atorvastatin (LIPITOR) 40 MG tablet Take 1 tablet (40 mg total) by mouth daily. Patient taking differently: Take 40 mg by mouth every evening.  02/19/17   Demetrios Loll, MD  calcium acetate (PHOSLO) 667 MG capsule Take 1,334-2,001 mg by mouth 3 (three) times daily with meals. take 3 capsules (2001mg ) by mouth three times daily before each meal, and 2 capsules (667mg ) by mouth with snacks twice daily 12/11/15   [provider]  carvedilol (COREG) 6.25 MG tablet Take 1 tablet (6.25  mg total) by mouth 2 (two) times daily with a meal. 09/21/16   Gouru, Aruna, MD  cinacalcet (SENSIPAR) 90 MG tablet Take 60 mg by mouth daily.     [provider]  furosemide (LASIX) 40 MG tablet Take 1 tablet (40 mg total) by mouth 2 (two) times daily. 02/18/17   Demetrios Loll, MD  hydrALAZINE (APRESOLINE) 50 MG tablet Take 100 mg by mouth 3 (three) times daily.  07/01/16   [provider]  ipratropium (ATROVENT HFA) 17 MCG/ACT inhaler Inhale 2 puffs into the lungs 4 (four) times daily as needed for wheezing.     [provider]  isosorbide mononitrate (IMDUR) 30 MG 24 hr tablet Take 1 tablet (30 mg total) by mouth daily. 08/24/16 02/17/18  Dustin Flock, MD  lidocaine-prilocaine (EMLA) cream Apply 1 application topically as needed (dialysis access).    [provider]  nitroGLYCERIN (NITROSTAT) 0.4 MG SL tablet Place 0.4 mg under the tongue every 5 (five) minutes as needed for chest pain.    [provider]  omeprazole (PRILOSEC) 40 MG capsule Take 40 mg by mouth every morning.  05/11/17   [provider]    oxyCODONE-acetaminophen (PERCOCET) 5-325 MG tablet Take 1-2 tablets by mouth every 6 (six) hours as needed for moderate pain or severe pain. Patient not taking: Reported on 02/08/2018 06/01/17 06/01/18  Schnier, Dolores Lory, MD  OXYGEN Inhale into the lungs daily as needed. Wears O2 at 2L [place] at home at night and as needed in the day    [provider]  predniSONE (DELTASONE) 10 MG tablet Take 3 tablets once a day for 4 days Patient not taking: Reported on 02/08/2018 01/30/18   Johnn Hai, PA-C  sacubitril-valsartan (ENTRESTO) 24-26 MG Take 1 tablet by mouth 2 (two) times daily. 03/16/17   Theora Gianotti, NP    Family History  Problem Relation Age of Onset  . Hypertension Son   . Diabetes Son   . Hypertension Mother   . Diabetes Mother   . Diabetes Sister      Social History   Tobacco Use  . Smoking status: Former Smoker    Packs/day: 0.25    Years: 40.00    Pack years: 10.00    Types: Cigarettes    Last attempt to quit: 10/12/2016    Years since quitting: 1.5  . Smokeless tobacco: Never Used  Substance Use Topics  . Alcohol use: No    Alcohol/week: 0.0 standard drinks  . Drug use: Yes    Types: Cocaine, Marijuana    Comment: patient states no drugs    Allergies as of 04/17/2018 - Review Complete 04/17/2018  Allergen Reaction Noted  . Sulfa antibiotics Rash and Shortness Of Breath 05/17/2013    Review of Systems:    All systems reviewed and negative except where noted in HPI.   Physical Exam:  Vital signs in last 24 hours: Temp:  [97.8 F (36.6 C)-98.6 F (37 C)] 97.8 F (36.6 C) (02/25 0515) Pulse Rate:  [79-86] 81 (02/25 0515) Resp:  [16-20] 20 (02/25 0515) BP: (90-123)/(51-61) 102/57 (02/25 0515) SpO2:  [94 %-100 %] 100 % (02/25 0515) Weight:  [60.6 kg-78.5 kg] 60.6 kg (02/25 0515) Last BM Date: 04/18/18 General:   Appears very comfortable laying flat in his bed but drowsy Head:  Normocephalic and atraumatic. Eyes:   No icterus.    Conjunctiva pink. PERRLA. Ears:  Normal auditory acuity. Neck:  Supple; no masses or thyroidomegaly Lungs: Respirations even  and unlabored. Lungs clear to auscultation bilaterally.   No wheezes, crackles, or rhonchi.  Heart:  Regular rate and rhythm;  Without murmur, clicks, rubs or gallops Abdomen:  Soft, nondistended, nontender. Normal bowel sounds. No appreciable masses or hepatomegaly.  No rebound or guarding.  Neurologic:  Alert and oriented x3;  grossly normal neurologically.  He is drowsy but arousable Skin:  Intact without significant lesions or rashes. Cervical Nodes:  No significant cervical adenopathy. Psych: Drowsy but arousable  LAB RESULTS: Recent Labs    04/17/18 1932 04/18/18 0806  WBC 9.2 10.2  HGB 8.5* 7.3*  HCT 27.3* 22.7*  PLT 153 151   BMET Recent Labs    04/17/18 1932  NA 141  K 4.8  CL 107  CO2 26  GLUCOSE 63*  BUN 83*  CREATININE 4.28*  CALCIUM 8.1*   LFT Recent Labs    04/17/18 1932  PROT 5.9*  ALBUMIN 2.8*  AST 25  ALT 18  ALKPHOS 80  BILITOT 1.2   PT/INR No results for input(s): LABPROT, INR in the last 72 hours.  STUDIES: No results found.    Impression / Plan:   [name] is a 63 y.o. y/o male with a history of ESRD, pulmonary HTN , CAD presented to the ER with rectal bleeding. Drop of 4 grams Hb from baseline.  Poor historian.  I discussed him with anesthesia and due to his pulmonary hypertension, end-stage renal disease, CAD it was felt that he should at least get 2 units of blood transfusion, dialysis today and if stable will plan to perform an upper endoscopy tomorrow.  If he were to bleed in the interim and it is felt to be urgent we can perform an EGD sooner.  I have discussed this plan with Dr. Margaretmary Eddy.  Plan  1. Monitor CBC and transfuse 2. IV PPI GTT 3. EGD tomorrow after above plan    I have discussed alternative options, risks & benefits,  which include, but are not limited to, bleeding, infection,  perforation,respiratory complication & drug reaction.  The patient agrees with this plan & written consent will be obtained.       Thank you for involving me in the care of this patient.      LOS: 0 days   Thomas Bellows, MD  04/18/2018, 8:47 AM

## 2018-04-18 NOTE — Progress Notes (Signed)
Post HD Assessment, pt need TX early, was very upset with the care he is receiving, pt feels the MD is "not doing anything for him" I tried to reassure him that his care plan was appropriate, however he did not agree and started using profanity. Pt lost 1 hr of prescribed TX time, MD is aware.    04/18/18 1645  Neurological  Orientation Level Oriented X4  Respiratory  Respiratory Pattern Regular;Unlabored  Chest Assessment Chest expansion symmetrical  Cough None  Cardiac  Pulse Regular  Heart Sounds S1, S2  ECG Monitor Yes  Cardiac Rhythm NSR  Vascular  R Radial Pulse +1  L Radial Pulse +1  Edema Generalized  Generalized Edema +1  Psychosocial  Psychosocial (WDL) X  Patient Behaviors Aggressive verbally;Restless;Irritable;Agitated;Uncooperative

## 2018-04-18 NOTE — Progress Notes (Signed)
[place] at [place] NAME: [name]    MR#:  [id number]  DATE OF BIRTH:  [birth year]  SUBJECTIVE:  CHIEF COMPLAINT: Patient feels tired sleepy but arousable and answers questions.  Reporting black tarry stool.  RN has noticed it.  GI recommending 2 units of blood transfusion and hemodialysis today per his discussion with anesthesia  REVIEW OF SYSTEMS:  CONSTITUTIONAL: No fever, fatigue or weakness.  Feeling tired EYES: No blurred or double vision.  EARS, NOSE, AND THROAT: No tinnitus or ear pain.  RESPIRATORY: No cough, shortness of breath, wheezing or hemoptysis.  CARDIOVASCULAR: No chest pain, orthopnea, edema.  GASTROINTESTINAL: No nausea, vomiting, diarrhea or abdominal pain.  Reporting melena HEMATOLOGY: No anemia, easy bruising or bleeding SKIN: No rash or lesion. MUSCULOSKELETAL: No joint pain or arthritis.   NEUROLOGIC: No tingling, numbness, weakness.  PSYCHIATRY: No anxiety or depression.   DRUG ALLERGIES:   Allergies  Allergen Reactions  . Sulfa Antibiotics Rash and Shortness Of Breath    VITALS:  Blood pressure 108/78, pulse 76, temperature 98 F (36.7 C), temperature source Oral, resp. rate 18, height 5\' 8"  (1.727 m), weight 60.6 kg, SpO2 100 %.  PHYSICAL EXAMINATION:  GENERAL:  63 y.o.-year-old patient lying in the bed with no acute distress.  EYES: Pupils equal, round, reactive to light and accommodation. No scleral icterus. Extraocular muscles intact.  HEENT: Head atraumatic, normocephalic. Oropharynx and nasopharynx clear.  NECK:  Supple, no jugular venous distention. No thyroid enlargement, no tenderness.  LUNGS: Normal breath sounds bilaterally, no wheezing, rales,rhonchi or crepitation. No use of accessory muscles of respiration.  CARDIOVASCULAR: S1, S2 normal. No murmurs, rubs, or gallops.  ABDOMEN: Soft, nontender, nondistended. Bowel sounds present.  EXTREMITIES: No pedal edema, cyanosis, or  clubbing.  NEUROLOGIC: Lethargic but arousable and answers questions appropriately. Sensation intact. Gait not checked.  PSYCHIATRIC: The patient is alert and oriented x 3.  SKIN: No obvious rash, lesion, or ulcer.    LABORATORY PANEL:   CBC Recent Labs  Lab 04/18/18 0806  WBC 10.2  HGB 7.3*  HCT 22.7*  PLT 151   ------------------------------------------------------------------------------------------------------------------  Chemistries  Recent Labs  Lab 04/17/18 1932 04/18/18 0806  NA 141 142  K 4.8 5.4*  CL 107 111  CO2 26 20*  GLUCOSE 63* 72  BUN 83* 134*  CREATININE 4.28* 4.97*  CALCIUM 8.1* 7.6*  AST 25  --   ALT 18  --   ALKPHOS 80  --   BILITOT 1.2  --    ------------------------------------------------------------------------------------------------------------------  Cardiac Enzymes No results for input(s): TROPONINI in the last 168 hours. ------------------------------------------------------------------------------------------------------------------  RADIOLOGY:  No results found.  EKG:   Orders placed or performed during the hospital encounter of 04/17/18  . ED EKG  . ED EKG    ASSESSMENT AND PLAN:    This is a 63 year old male admitted for GI bleed.  1.  GI bleed: Melena; hemodynamically stable.  Will provide 2 units of blood transfusion  Maintain 2 large-bore IVs Monitor hemoglobin hematocrit and transfuse as needed.   IV Protonix every 12 hours changed to Protonix drip  Consulted gastroenterology, discussed with Dr. Vicente Males.  Dr. Vicente Males is recommending 2 units of blood transfusion and hemodialysis today as per his discussion with anesthesia prior to any kind of procedures  Anticipating EGD tomorrow after hemodialysis today and 2 units of blood transfusion today Benadryl as needed  2.  CAD: Stable; hold aspirin and nitrates for now.  Monitor  telemetry.  3.  Hypertension: Controlled; also hold antihypertensive medication for now  4.   ESRD: On hemodialysis.   Discussed with Dr. Zollie Scale nephrology no need for hemodialysis.  Patient will get hemodialysis today  hold Sensipar and PhosLo until the patient resumes a diet  5.  CHF: Combined systolic and diastolic; stable.  Continue Entresto.  Lasix as needed.  6.  DVT prophylaxis: SCDs 7.  GI prophylaxis: Protonix drip    All the records are reviewed and case discussed with Care Management/Social Workerr. Management plans discussed with the patient, family and they are in agreement.  CODE STATUS: fc  TOTAL TIME TAKING CARE OF THIS PATIENT: 38 minutes.   POSSIBLE D/C IN 2 DAYS, DEPENDING ON CLINICAL CONDITION.  Note: This dictation was prepared with Dragon dictation along with smaller phrase technology. Any transcriptional errors that result from this process are unintentional.   [name] M.D on 04/18/2018 at 12:27 PM  Between 7am to 6pm - Pager - [phone] After 6pm go to [email or web address] - password [email or web address] [facility]  [facility]  Office  [phone]  CC: Primary care physician; Donnie Coffin, MD

## 2018-04-18 NOTE — ED Notes (Signed)
Pt having another loose BM

## 2018-04-18 NOTE — Progress Notes (Signed)
Post HD Tx.   04/18/18 1640  Hand-Off documentation  Report given to (Full Name) Delbert Harness, RN   Report received from (Full Name) Beatris Ship, RN   Vital Signs  Temp 98.1 F (36.7 C)  Temp Source Oral  Pulse Rate Source Monitor  BP 110/60  BP Location Right Arm  BP Method Automatic  Patient Position (if appropriate) Lying  Oxygen Therapy  O2 Device Nasal Cannula  O2 Flow Rate (L/min) 3 L/min  Pulse Oximetry Type Continuous  Pain Assessment  Pain Scale 0-10  Pain Score 0  Dialysis Weight  Weight 59.8 kg  Type of Weight Post-Dialysis  Post-Hemodialysis Assessment  Rinseback Volume (mL) 500 mL  KECN 39.4 V  Dialyzer Clearance Lightly streaked  Duration of HD Treatment -hour(s) 2.5 hour(s) (Pt request to end [place], MD aware )  Hemodialysis Intake (mL) 50 mL  UF Total -Machine (mL) 1054 mL  Net UF (mL) 1004 mL  AVG/AVF Arterial Site Held (minutes) 10 minutes  AVG/AVF Venous Site Held (minutes) 10 minutes  Fistula / Graft Left Forearm Arteriovenous fistula  Placement Date: 09/20/16   Placed prior to admission: Yes  Orientation: Left  Access Location: Forearm  Access Type: Arteriovenous fistula  Site Condition No complications  Fistula / Graft Assessment Present;Thrill;Bruit  Drainage Description None

## 2018-04-19 LAB — HEMOGLOBIN AND HEMATOCRIT, BLOOD
HCT: 24 % — ABNORMAL LOW (ref 39.0–52.0)
HCT: 22.9 % — ABNORMAL LOW (ref 39.0–52.0)
HCT: 28.3 % — ABNORMAL LOW (ref 39.0–52.0)
Hemoglobin: 8.1 g/dL — ABNORMAL LOW (ref 13.0–17.0)
Hemoglobin: 7.7 g/dL — ABNORMAL LOW (ref 13.0–17.0)
Hemoglobin: 9.3 g/dL — ABNORMAL LOW (ref 13.0–17.0)

## 2018-04-19 LAB — BASIC METABOLIC PANEL
Anion gap: 8 (ref 5–15)
BUN: 92 mg/dL — ABNORMAL HIGH (ref 8–23)
CO2: 23 mmol/L (ref 22–32)
Calcium: 7.5 mg/dL — ABNORMAL LOW (ref 8.9–10.3)
Chloride: 107 mmol/L (ref 98–111)
Creatinine, Ser: 4.17 mg/dL — ABNORMAL HIGH (ref 0.61–1.24)
GFR calc Af Amer: 16 mL/min — ABNORMAL LOW (ref >60)
GFR calc non Af Amer: 14 mL/min — ABNORMAL LOW (ref >60)
Glucose, Bld: 102 mg/dL — ABNORMAL HIGH (ref 70–99)
Potassium: 4.2 mmol/L (ref 3.5–5.1)
Sodium: 138 mmol/L (ref 135–145)

## 2018-04-19 LAB — PREPARE RBC (CROSSMATCH)

## 2018-04-19 MED ORDER — ASCORBIC ACID 500 MG/ML IJ SOLN: 500.0000 mg | Freq: Two times a day (BID) | INTRAMUSCULAR | Status: DC

## 2018-04-19 MED ORDER — SODIUM CHLORIDE 0.9% IV SOLUTION: Freq: Once | INTRAVENOUS | Status: DC

## 2018-04-19 MED ORDER — DEXTROSE 5 % IV SOLN
Freq: Two times a day (BID) | INTRAVENOUS | Status: DC
  Administered 2018-04-19 – 2018-04-21 (×4): via INTRAVENOUS
  Filled 2018-04-19 (×6): qty 1

## 2018-04-19 NOTE — Progress Notes (Signed)
[place] at [place] NAME: [name]    MR#:  [id number]  DATE OF BIRTH:  07-15-1955  SUBJECTIVE:  CHIEF COMPLAINT: Patient reporting black tarry stool intermittently   GI recommending 1 unit of blood transfusion and patient scheduled for hemodialysis today   REVIEW OF SYSTEMS:  CONSTITUTIONAL: No fever, fatigue or weakness.  Feeling tired EYES: No blurred or double vision.  EARS, NOSE, AND THROAT: No tinnitus or ear pain.  RESPIRATORY: No cough, shortness of breath, wheezing or hemoptysis.  CARDIOVASCULAR: No chest pain, orthopnea, edema.  GASTROINTESTINAL: No nausea, vomiting, diarrhea or abdominal pain.  Reporting melena HEMATOLOGY: No anemia, easy bruising or bleeding SKIN: No rash or lesion. MUSCULOSKELETAL: No joint pain or arthritis.   NEUROLOGIC: No tingling, numbness, weakness.  PSYCHIATRY: No anxiety or depression.   DRUG ALLERGIES:   Allergies  Allergen Reactions  . Sulfa Antibiotics Rash and Shortness Of Breath    VITALS:  Blood pressure 109/60, pulse 72, temperature 97.7 F (36.5 C), temperature source Oral, resp. rate 19, height 5\' 8"  (1.727 m), weight 40 kg, SpO2 100 %.  PHYSICAL EXAMINATION:  GENERAL:  63 y.o.-year-old patient lying in the bed with no acute distress.  EYES: Pupils equal, round, reactive to light and accommodation. No scleral icterus. Extraocular muscles intact.  HEENT: Head atraumatic, normocephalic. Oropharynx and nasopharynx clear.  NECK:  Supple, no jugular venous distention. No thyroid enlargement, no tenderness.  LUNGS: Normal breath sounds bilaterally, no wheezing, rales,rhonchi or crepitation. No use of accessory muscles of respiration.  CARDIOVASCULAR: S1, S2 normal. No murmurs, rubs, or gallops.  ABDOMEN: Soft, nontender, nondistended. Bowel sounds present.  EXTREMITIES: No pedal edema, cyanosis, or clubbing.  NEUROLOGIC: Lethargic but arousable and answers questions  appropriately. Sensation intact. Gait not checked.  PSYCHIATRIC: The patient is alert and oriented x 3.  SKIN: No obvious rash, lesion, or ulcer.    LABORATORY PANEL:   CBC Recent Labs  Lab 04/18/18 0806  04/19/18 0603  WBC 10.2  --   --   HGB 7.3*   < > 7.7*  HCT 22.7*   < > 22.9*  PLT 151  --   --    < > = values in this interval not displayed.   ------------------------------------------------------------------------------------------------------------------  Chemistries  Recent Labs  Lab 04/17/18 1932  04/19/18 0035  NA 141   < > 138  K 4.8   < > 4.2  CL 107   < > 107  CO2 26   < > 23  GLUCOSE 63*   < > 102*  BUN 83*   < > 92*  CREATININE 4.28*   < > 4.17*  CALCIUM 8.1*   < > 7.5*  AST 25  --   --   ALT 18  --   --   ALKPHOS 80  --   --   BILITOT 1.2  --   --    < > = values in this interval not displayed.   ------------------------------------------------------------------------------------------------------------------  Cardiac Enzymes No results for input(s): TROPONINI in the last 168 hours. ------------------------------------------------------------------------------------------------------------------  RADIOLOGY:  No results found.  EKG:   Orders placed or performed during the hospital encounter of 04/17/18  . ED EKG  . ED EKG    ASSESSMENT AND PLAN:    This is a 63 year old male admitted for GI bleed.  1.  GI bleed: Melena; hemodynamically stable.  Received 2 units of blood 04/18/2018 and 1 more unit of blood transfusion  today  Maintain 2 large-bore IVs Monitor hemoglobin hematocrit and transfuse as needed.   IV Protonix every 12 hours changed to Protonix drip  Consulted gastroenterology, discussed with Dr. Vicente Males.  Dr. Vicente Males is recommending 1 units of blood transfusion and hemodialysis today Anticipating EGD tomorrow after hemodialysis today and 1 units of blood transfusion today Benadryl as needed Hemoglobin 7.3-2 units of blood  transfusion-9.5-8.1-7.7-1 unit of blood transfusion today  2.  CAD: Stable; hold aspirin and nitrates for now.  Monitor telemetry.  3.  Hypertension: Controlled; also hold antihypertensive medication for now  4.  ESRD: On hemodialysis.   Discussed with Dr. Zollie Scale nephrology no need for hemodialysis.  Patient will get hemodialysis today  hold Sensipar and PhosLo until the patient resumes a diet  5.  CHF: Combined systolic and diastolic; stable.  Continue Entresto.  Lasix as needed.  6.  DVT prophylaxis: SCDs 7.  GI prophylaxis: Protonix drip    All the records are reviewed and case discussed with Care Management/Social Workerr. Management plans discussed with the patient, family and they are in agreement.  CODE STATUS: fc  TOTAL TIME TAKING CARE OF THIS PATIENT: 35 minutes.   POSSIBLE D/C IN 2 DAYS, DEPENDING ON CLINICAL CONDITION.  Note: This dictation was prepared with Dragon dictation along with smaller phrase technology. Any transcriptional errors that result from this process are unintentional.   Nicholes Mango M.D on 04/19/2018 at 3:41 PM  Between 7am to 6pm - Pager - [phone] After 6pm go to [email or web address] - password [email or web address] [facility]  [place] [facility]  Office  [phone]  CC: Primary care physician; Donnie Coffin, MD

## 2018-04-19 NOTE — Progress Notes (Signed)
Per MD okay for RN to DC telemetry order. 

## 2018-04-19 NOTE — Progress Notes (Signed)
   04/19/18 1015  Oxygen Therapy  SpO2 100 %  O2 Device Nasal Cannula  O2 Flow Rate (L/min) 2 L/min  Pulse Oximetry Type Continuous  Pain Assessment  Pain Scale 0-10  Pain Score 0  PCA/Epidural/Spinal Assessment  Respiratory Pattern Regular  Height and Weight  Weight 40 kg  Type of Scale Used Bed  Type of Weight Pre-Dialysis  BMI (Calculated) 13.41  Glasgow Coma Scale  Eye Opening 4  Level of Consciousness  Level of Consciousness Alert  MEWS Score  MEWS RR 0  MEWS Pulse 0  MEWS Systolic 1  MEWS LOC 0  MEWS Temp 0  MEWS Score 1  MEWS Score Color Green

## 2018-04-19 NOTE — Progress Notes (Signed)
[facility]  ROUNDING NOTE   Subjective:  Patient seen and evaluated during hemodialysis. Tolerating well so far.   Objective:  Vital signs in last 24 hours:  Temp:  [98 F (36.7 C)-98.7 F (37.1 C)] 98.7 F (37.1 C) (02/26 0536) Pulse Rate:  [71-91] 91 (02/26 0536) Resp:  [16-31] 20 (02/26 0536) BP: (87-121)/(49-88) 98/60 (02/26 0536) SpO2:  [93 %-100 %] 100 % (02/26 1015) Weight:  [40.1 kg-60.6 kg] 40.1 kg (02/26 0051)  Weight change: -17.9 kg Filed Weights   04/18/18 1400 04/18/18 1640 04/19/18 0051  Weight: 60.6 kg 59.8 kg 40.1 kg    Intake/Output: I/O last 3 completed shifts: In: 1653.6 [P.O.:720; I.V.:64.6; Blood:869] Out: 1004 [PYKDX:8338]   Intake/Output this shift:  No intake/output data recorded.  Physical Exam: General: No acute distress  Head: Normocephalic, atraumatic. Moist oral mucosal membranes  Eyes: Anicteric  Neck: Supple, trachea midline  Lungs:  Clear to auscultation, normal effort  Heart: S1S2 no rubs  Abdomen:  Soft, nontender, bowel sounds present  Extremities:  peripheral edema.  Neurologic: Awake, alert, following commands  Skin: No lesions  Access: LUE AVG    Basic Metabolic Panel: Recent Labs  Lab 04/17/18 1932 04/18/18 0806 04/19/18 0035  NA 141 142 138  K 4.8 5.4* 4.2  CL 107 111 107  CO2 26 20* 23  GLUCOSE 63* 72 102*  BUN 83* 134* 92*  CREATININE 4.28* 4.97* 4.17*  CALCIUM 8.1* 7.6* 7.5*    Liver Function Tests: Recent Labs  Lab 04/17/18 1932  AST 25  ALT 18  ALKPHOS 80  BILITOT 1.2  PROT 5.9*  ALBUMIN 2.8*   No results for input(s): LIPASE, AMYLASE in the last 168 hours. No results for input(s): AMMONIA in the last 168 hours.  CBC: Recent Labs  Lab 04/17/18 1932 04/18/18 0806 04/18/18 1834 04/19/18 0035 04/19/18 0603  WBC 9.2 10.2  --   --   --   HGB 8.5* 7.3* 9.5* 8.1* 7.7*  HCT 27.3* 22.7* 27.8* 24.0* 22.9*  MCV 95.5 95.0  --   --   --   PLT 153 151  --   --   --     Cardiac  Enzymes: No results for input(s): CKTOTAL, CKMB, CKMBINDEX, TROPONINI in the last 168 hours.  BNP: Invalid input(s): POCBNP  CBG: No results for input(s): GLUCAP in the last 168 hours.  Microbiology: Results for orders placed or performed during the hospital encounter of 04/17/18  MRSA PCR Screening     Status: None   Collection Time: 04/18/18  4:39 AM  Result Value Ref Range Status   MRSA by PCR NEGATIVE NEGATIVE Final    Comment:        The GeneXpert MRSA Assay (FDA approved for NASAL specimens only), is one component of a comprehensive MRSA colonization surveillance program. It is not intended to diagnose MRSA infection nor to guide or monitor treatment for MRSA infections. Performed at [facility], [address] [place], [place] [address]     Coagulation Studies: No results for input(s): LABPROT, INR in the last 72 hours.  Urinalysis: No results for input(s): COLORURINE, LABSPEC, PHURINE, GLUCOSEU, HGBUR, BILIRUBINUR, KETONESUR, PROTEINUR, UROBILINOGEN, NITRITE, LEUKOCYTESUR in the last 72 hours.  Invalid input(s): APPERANCEUR    Imaging: No results found.   Medications:   . pantoprozole (PROTONIX) infusion 8 mg/hr (04/19/18 0336)   . sodium chloride   Intravenous Once  . amiodarone  200 mg Oral Daily  . Chlorhexidine Gluconate Cloth  6 each Topical Q0600  . cinacalcet  60 mg Oral Daily  . [START ON 04/22/2018] pantoprazole  40 mg Intravenous Q12H   acetaminophen **OR** acetaminophen, diphenhydrAMINE, hydrOXYzine, ipratropium-albuterol, ondansetron **OR** ondansetron (ZOFRAN) IV  Assessment/ Plan:  63 y.o. male withend stage renal disease on hemodialysis, hypertension, hepatitis C, anemia, seizure disorder, COPD/tobacco abuse,history of substance abuse,secondary hyperparathyroidism  [facility] MWF [address]Left arm AVG.  240 minutes  1.  ESRD on HD MWF.  Patient seen and evaluated during hemodialysis.  Tolerating well.  Had dialysis  yesterday as well secondary to blood transfusion requirement.  We plan to complete dialysis treatment today.  2.  Anemia of chronic kidney disease/GI bleed.  Gastroenterology evaluation still pending.  Patient to receive another unit of blood today.  3.  Secondary hyperparathyroidism.  Binders currently on hold as patient n.p.o.  4.  Further plan as patient progresses.  LOS: 1 Clara Smolen 2/26/202011:15 AM

## 2018-04-19 NOTE — Progress Notes (Signed)
Initial Nutrition Assessment  DOCUMENTATION CODES:   Not applicable  INTERVENTION:   Vitamin C 500mg  IV BID x 5 days, followed by 500mg  po BID once diet able to be advanced  Once diet advanced, recommend:   Nepro Shake po BID, each supplement provides 425 kcal and 19 grams protein  Rena-vite daily   NUTRITION DIAGNOSIS:   Increased nutrient needs related to chronic illness(ESRD on HD, CHF) as evidenced by increased estimated needs  GOAL:   Patient will meet greater than or equal to 90% of their needs  MONITOR:   Diet advancement, Labs, Weight trends, Skin, I & O's  REASON FOR ASSESSMENT:   Other (Comment)(Low BMI )    ASSESSMENT:   63 y/o male with past medical history significant for ischemic heart disease, combined systolic and diastolic CHF, diabetes, end-stage renal disease on dialysis, hypertension and history of duodenal ulcer presents to the emergency department from dialysis after developing dark stools.   Unable to see pt today as pt in HD at time of RD visit. Per chart review, pt has been on HD for many years. Pt does have a h/o tobacco and etoh abuse. Pt's UBW appears to be around 75kg; however, pt's weights from this admit have been anywhere from 40-60kg. Pt appears to have weighed ~186lbs several years ago. Suspect pt has had some weight loss but unable to confirm at this time. Pt likely with vitamin C deficiency given his h/o tobacco abuse and chronic HD. This could likely be worsening pt's GIB. Will plan to supplement vitamin C 500mg  IV BID until diet able to be advanced. RD will follow up with exam and obtain nutrition related history at follow-up. Pt NPO today for EGD. Will order supplements once diet advanced.     Medications reviewed and include: cinacalcet, protonix  Labs reviewed: BUN 92(H), creat 4.17(H) P 6.3(H)- 12/18 Hgb 7.7(L), Hct 22.9(L)  Diet Order:   Diet Order            Diet NPO time specified  Diet effective now              EDUCATION NEEDS:   Not appropriate for education at this time  Skin:  Skin Assessment: Reviewed RN Assessment  Last BM:  2/26- type 7  Height:   Ht Readings from Last 1 Encounters:  04/17/18 5\' 8"  (1.727 m)    Weight:   Wt Readings from Last 1 Encounters:  04/19/18 40 kg    Ideal Body Weight:  70 kg  BMI:  Body mass index is 13.41 kg/m.  Estimated Nutritional Needs:   Kcal:  2000-2300kcal/day   Protein:  91-105g/day   Fluid:  UOP + 1L  Koleen Distance MS, RD, LDN Pager #- [phone] Office#- [phone] After Hours Pager: [phone]'

## 2018-04-19 NOTE — Progress Notes (Signed)
   04/19/18 1352  Hand-Off documentation  Report given to (Full Name) Lawrence Santiago, RN  Vital Signs  Temp 98.2 F (36.8 C)  Temp Source Oral  Pulse Rate 73  Resp 15  Post-Hemodialysis Assessment  Rinseback Volume (mL) 250 mL  Dialyzer Clearance Heavily streaked  Duration of HD Treatment -hour(s) 3 hour(s)  Hemodialysis Intake (mL) 670 mL (250 prime and 420 mL PRBCs)  UF Total -Machine (mL) 500 mL  Net UF (mL) -170 mL  Tolerated HD Treatment Yes  AVG/AVF Arterial Site Held (minutes) 5 minutes  AVG/AVF Venous Site Held (minutes) 15 minutes  Education / Care Plan  Dialysis Education Provided Yes  Documented Education in Care Plan Yes  Fistula / Graft Left Forearm Arteriovenous fistula  Placement Date: 09/20/16   Placed prior to admission: Yes  Orientation: Left  Access Location: Forearm  Access Type: Arteriovenous fistula  Site Condition No complications  Fistula / Graft Assessment Present;Thrill;Bruit  Status Deaccessed  Hemodialysis treatment completed at 1352. Net UF removed 170 mL. Blood rinsed back, Needles removed one at a time and manual pressure held to AVF until hemostasis achieved. No active bleeding upon patient departure from [place]. Post hemodialysis report given to J. Norma Fredrickson, RN.

## 2018-04-19 NOTE — Progress Notes (Signed)
   04/19/18 1015  Hand-Off documentation  Report received from (Full Name) Lawrence Santiago, RN  Vital Signs  Pulse Rate 87  BP (!) 98/57  BP Location Right Arm  BP Method Automatic  Oxygen Therapy  SpO2 100 %  O2 Device Nasal Cannula  O2 Flow Rate (L/min) 2 L/min  Pulse Oximetry Type Continuous  Pain Assessment  Pain Scale 0-10  Pain Score 0  Dialysis Weight  Weight 40 kg  Type of Weight Pre-Dialysis  Time-Out for Hemodialysis  What Procedure? hemodialysis  Pt Identifiers(min of two) First/Last Name;MRN/Account#;Pt's DOB(use if MRN/Acct# not available  Correct Site? Yes  Correct Side? Yes  Correct Procedure? Yes  Consents Verified? Yes  Engineer, civil (consulting) Number 4  Station Number 4  UF/Alarm Test Passed  Conductivity: Meter 14  Conductivity: Machine  13.8  pH 7.4  Reverse Osmosis main  Normal Saline Lot Number [id number]  Dialyzer Lot Number [id number]  Disposable Set Lot Number [id number]  Machine Temperature 98.6 F (37 C)  Musician and Audible Yes  Blood Lines Intact and Secured Yes  Pre Treatment Patient Checks  Vascular access used during treatment Fistula  Hepatitis B Surface Antigen Results  (unknown...lab drawn. pending results)  Hemodialysis Consent Verified Yes  Prime Ordered Normal Saline  Length of  DialysisTreatment -hour(s) 3 Hour(s)  Dialyzer Elisio 17H NR  Dialysate 2K, 2.5 Ca  Dialysate Flow Ordered 800  Blood Flow Rate Ordered 400 mL/min  Ultrafiltration Goal 1 Liters  Blood Products Ordered Packed Red Blood Cells  Dialysis Blood Pressure Support Ordered Normal Saline  During Hemodialysis Assessment  Blood Flow Rate (mL/min) 400 mL/min  Arterial Pressure (mmHg) -110 mmHg  Venous Pressure (mmHg) 180 mmHg  Transmembrane Pressure (mmHg) 50 mmHg  Ultrafiltration Rate (mL/min) 140 mL/min  Dialysate Flow Rate (mL/min) 800 ml/min  Conductivity: Machine  13.8  HD Safety Checks Performed Yes  Pre hemodialysis report received from Lawrence Santiago, RN. Received patient in RDU via bed, awake, alert and verbally responsive. No acute distress noted. +Bruit/thrill noted to LUE AVF. AVF accessed with two 15g needles without difficulty. Hemodialysis treatment initiated at 1038 via AVF. Plan for 3 hours hemodialysis with UF of 1L as tolerated. Patent to receive 1U PRBCs with treatment.

## 2018-04-19 NOTE — Progress Notes (Signed)
Patient still in dialysis, was scheduled for EGD today at 1 pm . Per anesthesia cannot sedate for at [facility] 4 hours post dialysis. Will have to reschedule EGD for tomorrow morning.   In interim continue PPI GTT, transfuse to keep Hb > 8 grams. Informed Dr Margaretmary Eddy  Dr Jonathon Bellows MD,MRCP [facility]) Gastroenterology/Hepatology Pager: [phone]

## 2018-04-19 NOTE — Progress Notes (Signed)
Per GI pt will have EGD tomorrow morning instead. Okay for pt to have clears liquids and NPO after midnight.

## 2018-04-19 NOTE — Progress Notes (Addendum)
RN paged on call Hospitalist to inform of abnormal HMG 7.7 and HCT of 22.9

## 2018-04-20 ENCOUNTER — Inpatient Hospital Stay: Payer: Medicare Other | Admitting: Anesthesiology

## 2018-04-20 ENCOUNTER — Encounter
Admission: EM | Disposition: A | Discharge status: Home / Self Care | Payer: Self-pay | Source: Home / Self Care | Attending: Internal Medicine

## 2018-04-20 ENCOUNTER — Encounter: Payer: Self-pay | Admitting: Anesthesiology

## 2018-04-20 DIAGNOSIS — K269 Duodenal ulcer, unspecified as acute or chronic, without hemorrhage or perforation: Secondary | ICD-10-CM

## 2018-04-20 HISTORY — PX: ESOPHAGOGASTRODUODENOSCOPY (EGD) WITH PROPOFOL: SHX5813

## 2018-04-20 LAB — TYPE AND SCREEN
ABO/RH(D): O POS
Antibody Screen: NEGATIVE
UNIT DIVISION: 0
Unit division: 0
Unit division: 0
Unit division: 0

## 2018-04-20 LAB — BASIC METABOLIC PANEL
Anion gap: 8 (ref 5–15)
BUN: 46 mg/dL — ABNORMAL HIGH (ref 8–23)
CALCIUM: 8.1 mg/dL — AB (ref 8.9–10.3)
CO2: 26 mmol/L (ref 22–32)
Chloride: 105 mmol/L (ref 98–111)
Creatinine, Ser: 3.61 mg/dL — ABNORMAL HIGH (ref 0.61–1.24)
GFR calc Af Amer: 19 mL/min — ABNORMAL LOW (ref >60)
GFR calc non Af Amer: 17 mL/min — ABNORMAL LOW (ref >60)
Glucose, Bld: 66 mg/dL — ABNORMAL LOW (ref 70–99)
Potassium: 4.3 mmol/L (ref 3.5–5.1)
Sodium: 139 mmol/L (ref 135–145)

## 2018-04-20 LAB — HEPATITIS B SURFACE ANTIBODY, QUANTITATIVE: HEPATITIS B-POST: 53.9 m[IU]/mL

## 2018-04-20 LAB — BPAM RBC
BLOOD PRODUCT EXPIRATION DATE: 202003212359
Blood Product Expiration Date: 202003192359
Blood Product Expiration Date: 202003212359
Blood Product Expiration Date: 202003222359
ISSUE DATE / TIME: 202002250111
ISSUE DATE / TIME: 202002251511
ISSUE DATE / TIME: 202002261059
ISSUE DATE / TIME: 202002251151
Unit Type and Rh: 5100
Unit Type and Rh: 5100
Unit Type and Rh: 5100
Unit Type and Rh: 5100

## 2018-04-20 LAB — HEMOGLOBIN AND HEMATOCRIT, BLOOD
HCT: 27.3 % — ABNORMAL LOW (ref 39.0–52.0)
Hemoglobin: 8.8 g/dL — ABNORMAL LOW (ref 13.0–17.0)

## 2018-04-20 LAB — HEPATITIS B SURFACE ANTIGEN: Hepatitis B Surface Ag: NEGATIVE

## 2018-04-20 LAB — GLUCOSE, CAPILLARY: Glucose-Capillary: 103 mg/dL — ABNORMAL HIGH (ref 70–99)

## 2018-04-20 SURGERY — ESOPHAGOGASTRODUODENOSCOPY (EGD) WITH PROPOFOL
Anesthesia: General

## 2018-04-20 MED ORDER — DEXTROSE 50 % IV SOLN
12.5000 g | Freq: Once | INTRAVENOUS | Status: AC
  Administered 2018-04-20: 12.5 g via INTRAVENOUS
  Filled 2018-04-20: qty 50

## 2018-04-20 MED ORDER — RENA-VITE PO TABS
1.0000 | ORAL_TABLET | Freq: Every day | ORAL | Status: DC
  Administered 2018-04-20: 1 via ORAL
  Filled 2018-04-20: qty 1

## 2018-04-20 MED ORDER — PROPOFOL 10 MG/ML IV BOLUS
INTRAVENOUS | Status: AC
  Filled 2018-04-20: qty 20

## 2018-04-20 MED ORDER — VASOPRESSIN 20 UNIT/ML IV SOLN
INTRAVENOUS | Status: DC | PRN
  Administered 2018-04-20: 1 [IU] via INTRAVENOUS

## 2018-04-20 MED ORDER — LIDOCAINE HCL (CARDIAC) PF 100 MG/5ML IV SOSY
PREFILLED_SYRINGE | INTRAVENOUS | Status: DC | PRN
  Administered 2018-04-20: 50 mg via INTRATRACHEAL

## 2018-04-20 MED ORDER — PHENYLEPHRINE HCL 10 MG/ML IJ SOLN
INTRAMUSCULAR | Status: DC | PRN
  Administered 2018-04-20: 100 ug via INTRAVENOUS

## 2018-04-20 MED ORDER — PANTOPRAZOLE SODIUM 40 MG PO TBEC
40.0000 mg | DELAYED_RELEASE_TABLET | Freq: Two times a day (BID) | ORAL | Status: DC
  Administered 2018-04-20 – 2018-04-21 (×3): 40 mg via ORAL
  Filled 2018-04-20 (×3): qty 1

## 2018-04-20 MED ORDER — PROPOFOL 10 MG/ML IV BOLUS
INTRAVENOUS | Status: DC | PRN
  Administered 2018-04-20: 20 ug/kg/min via INTRAVENOUS

## 2018-04-20 MED ORDER — SODIUM CHLORIDE 0.9 % IV SOLN
INTRAVENOUS | Status: DC
  Administered 2018-04-20: 500 mL via INTRAVENOUS

## 2018-04-20 MED ORDER — NEPRO/CARBSTEADY PO LIQD: 237.0000 mL | Freq: Two times a day (BID) | ORAL | Status: DC

## 2018-04-20 NOTE — H&P (Signed)
Jonathon Bellows, MD [address], [place], [place], [place], [address] [address], [place], [place], [place], [address] Phone: [phone]  Fax: [phone]  Primary Care Physician:  Donnie Coffin, MD   Pre-Procedure History & Physical: HPI:  Orval Dortch is a 63 y.o. male is here for an endoscopy    Past Medical History:  Diagnosis Date  . Arthritis   . CAD (coronary artery disease)    a. 09/2013 Myoview [facility]): basal inf defect more pronounced @ rest, likely artifact, EF 48%, low risk study; b. 06/2016 Cath [facility]): LAD 10p, 30-14m, 40-50d, D1 90(small), D2 40, D3 60-70, D4 30, LCX 10p, 16m, 20d, Om1 30, OM3 40, RCA 100p w/ bridging collats, m/d RCA fill via collats, small/mod caliber-->Med Rx; c. 02/2017 MV: inf infarct w/ peri-inf ischemia. EF <30%.   . Chronic combined systolic and diastolic CHF (congestive heart failure) ([place])    a. 10/2013 Echo [facility]): EF 50%, diast dysfxn;  b. 10/2014 Echo: EF 30-35%, diff HK, gr1 DD;  c. 04/2016 Echo: EF 20-25%, sev dil LV, diff HK, gr3 DD; d. 06/2016 Echo: EF 30-35%, Gr2 DD.  . Diabetes ([place])   . Dyspnea   . ESRD (end stage renal disease) ([place])    a. MWF dialysis @ [facility].  . Hepatitis    Patient is unsure type of Hepatitis he has  . Hypertension   . Hypertensive heart disease with CHF (congestive heart failure) ([place])   . Mixed Ischemic and non-ischemic cardiomyopathy    a. 10/2013 Echo [facility]): EF 50%, diast dysfxn;  b. 10/2014 Echo: EF 30-35%, diff HK, gr1 DD;  c. 04/2016 Echo: EF 20-25%, sev dil LV, diff HK, gr3 DD; d. 06/2016 Echo: EF 30-35%, Gr2 DD.  [name] Moderate Aortic insufficiency    a. 04/2016 Echo: Mod AI.  [name] Myocardial infarction ([place])   . Peripheral vascular disease ([place])   . Pulmonary hypertension ([place])    a. 10/2014 Echo: Mod-Sev PAH.  [name] PVC's (premature ventricular contractions)    a. 02/2017 24 hr holter: freq polymorphic PVCs w/ total of 38K beats in 24 hrs (34% burden).    Past Surgical History:  Procedure  Laterality Date  . A/V FISTULAGRAM Left 04/06/2016   Procedure: A/V Fistulagram;  Surgeon: Katha Cabal, MD;  Location: [place] [facility];  Service: Cardiovascular;  Laterality: Left;  . A/V FISTULAGRAM Left 01/19/2017   Procedure: A/V FISTULAGRAM;  Surgeon: Katha Cabal, MD;  Location: [place] [facility];  Service: Cardiovascular;  Laterality: Left;  . A/V SHUNT INTERVENTION N/A 04/06/2016   Procedure: A/V Shunt Intervention;  Surgeon: Katha Cabal, MD;  Location: [place] [facility];  Service: Cardiovascular;  Laterality: N/A;  . A/V SHUNT INTERVENTION N/A 06/29/2016   Procedure: A/V Shunt Intervention;  Surgeon: Katha Cabal, MD;  Location: [place] [facility];  Service: Cardiovascular;  Laterality: N/A;  . AV FISTULA PLACEMENT Left 2014  . AV FISTULA PLACEMENT Left 06/01/2017   Procedure: INSERTION OF ARTERIOVENOUS (AV) GORE-TEX GRAFT ARM ( BRACHIAL AXILLARY );  Surgeon: Katha Cabal, MD;  Location: [facility] OR[facility];  Service: Vascular;  Laterality: Left;  . DIALYSIS/PERMA CATHETER INSERTION Right    Dr. Delana Meyer  . DIALYSIS/PERMA CATHETER REMOVAL N/A 07/05/2017   Procedure: DIALYSIS/PERMA CATHETER REMOVAL;  Surgeon: Katha Cabal, MD;  Location: [place] [facility];  Service: Cardiovascular;  Laterality: N/A;  . PERIPHERAL VASCULAR CATHETERIZATION N/A 10/03/2014   Procedure: A/V Shuntogram/Fistulagram;  Surgeon: Corene Cornea  Bunnie Domino, MD;  Location: [place] [facility];  Service: Cardiovascular;  Laterality: N/A;  . PERIPHERAL VASCULAR CATHETERIZATION Left 10/03/2014   Procedure: A/V Shunt Intervention;  Surgeon: Algernon Huxley, MD;  Location: [place] [facility];  Service: Cardiovascular;  Laterality: Left;  . PERIPHERAL VASCULAR CATHETERIZATION Left 05/01/2015   Procedure: A/V Shuntogram/Fistulagram;  Surgeon: Algernon Huxley, MD;  Location: [place] [facility];  Service: Cardiovascular;  Laterality: Left;  . PERIPHERAL VASCULAR CATHETERIZATION N/A 05/01/2015   Procedure: A/V  Shunt Intervention;  Surgeon: Algernon Huxley, MD;  Location: [place] [facility];  Service: Cardiovascular;  Laterality: N/A;  . UPPER EXTREMITY ANGIOGRAPHY Bilateral 06/29/2016   Procedure: Upper Extremity Angiography;  Surgeon: Katha Cabal, MD;  Location: [place] [facility];  Service: Cardiovascular;  Laterality: Bilateral;  . UPPER EXTREMITY INTERVENTION  06/29/2016   Procedure: Upper Extremity Intervention;  Surgeon: Katha Cabal, MD;  Location: [place] [facility];  Service: Cardiovascular;;  . UPPER EXTREMITY VENOGRAPHY Left 03/15/2017   Procedure: UPPER EXTREMITY VENOGRAPHY;  Surgeon: Katha Cabal, MD;  Location: [place] [facility];  Service: Cardiovascular;  Laterality: Left;    Prior to Admission medications   Medication Sig Start Date End Date Taking? Authorizing Provider  acetaminophen (TYLENOL) 325 MG tablet Take 2 tablets (650 mg total) by mouth every 6 (six) hours as needed for mild pain (or Fever >/= 101). Patient taking differently: Take 500 mg by mouth every 8 (eight) hours as needed for mild pain (or Fever >/= 101).  09/21/16   Gouru, Aruna, MD  albuterol (PROVENTIL HFA;VENTOLIN HFA) 108 (90 Base) MCG/ACT inhaler Inhale 2 puffs into the lungs every 4 (four) hours as needed for wheezing or shortness of breath.     [provider]  amiodarone (PACERONE) 200 MG tablet Take 1 tablet (200 mg total) by mouth daily. 04/06/17   Theora Gianotti, NP  aspirin 81 MG chewable tablet Chew 81 mg by mouth daily. 07/21/16   [provider]  atorvastatin (LIPITOR) 40 MG tablet Take 1 tablet (40 mg total) by mouth daily. Patient taking differently: Take 40 mg by mouth every evening.  02/19/17   Demetrios Loll, MD  calcium acetate (PHOSLO) 667 MG capsule Take 1,334-2,001 mg by mouth 3 (three) times daily with meals. take 3 capsules (2001mg ) by mouth three times daily before each meal, and 2 capsules (667mg ) by mouth with snacks twice daily 12/11/15   [provider]  carvedilol (COREG) 6.25 MG tablet Take 1 tablet (6.25 mg total) by mouth 2 (two) times daily with a meal. 09/21/16   Gouru, Aruna, MD  cinacalcet (SENSIPAR) 90 MG tablet Take 60 mg by mouth daily.     [provider]  cyclobenzaprine (FLEXERIL) 5 MG tablet Take 5 mg by mouth daily. 03/17/18   [provider]  furosemide (LASIX) 40 MG tablet Take 1 tablet (40 mg total) by mouth 2 (two) times daily. 02/18/17   Demetrios Loll, MD  hydrALAZINE (APRESOLINE) 50 MG tablet Take 100 mg by mouth 3 (three) times daily.  07/01/16   [provider]  ipratropium (ATROVENT HFA) 17 MCG/ACT inhaler Inhale 2 puffs into the lungs 4 (four) times daily as needed for wheezing.     [provider]  isosorbide mononitrate (IMDUR) 30 MG 24 hr tablet Take 1 tablet (30 mg total) by mouth daily. 08/24/16 02/17/18  Dustin Flock, MD  lidocaine-prilocaine (EMLA) cream Apply 1 application topically as needed (dialysis access).  [provider]  nitroGLYCERIN (NITROSTAT) 0.4 MG SL tablet Place 0.4 mg under the tongue every 5 (five) minutes as needed for chest pain.    [provider]  omeprazole (PRILOSEC) 40 MG capsule Take 40 mg by mouth every morning.  05/11/17   [provider]  oxyCODONE-acetaminophen (PERCOCET) 5-325 MG tablet Take 1-2 tablets by mouth every 6 (six) hours as needed for moderate pain or severe pain. Patient not taking: Reported on 02/08/2018 06/01/17 06/01/18  Schnier, Dolores Lory, MD  OXYGEN Inhale into the lungs daily as needed. Wears O2 at 2L [place] at home at night and as needed in the day    [provider]  predniSONE (DELTASONE) 10 MG tablet Take 3 tablets once a day for 4 days Patient not taking: Reported on 02/08/2018 01/30/18   Johnn Hai, PA-C  sacubitril-valsartan (ENTRESTO) 24-26 MG Take 1 tablet by mouth 2 (two) times daily. 03/16/17   Theora Gianotti, NP    Allergies as of 04/17/2018 - Review Complete  04/17/2018  Allergen Reaction Noted  . Sulfa antibiotics Rash and Shortness Of Breath 05/17/2013    Family History  Problem Relation Age of Onset  . Hypertension Son   . Diabetes Son   . Hypertension Mother   . Diabetes Mother   . Diabetes Sister     Social History   Socioeconomic History  . Marital status: Widowed    Spouse name: Not on file  . Number of children: Not on file  . Years of education: Not on file  . Highest education level: Not on file  Occupational History  . Not on file  Social Needs  . Financial resource strain: Not on file  . Food insecurity:    Worry: Not on file    Inability: Not on file  . Transportation needs:    Medical: Not on file    Non-medical: Not on file  Tobacco Use  . Smoking status: Former Smoker    Packs/day: 0.25    Years: 40.00    Pack years: 10.00    Types: Cigarettes    Last attempt to quit: 10/12/2016    Years since quitting: 1.5  . Smokeless tobacco: Never Used  Substance and Sexual Activity  . Alcohol use: No    Alcohol/week: 0.0 standard drinks  . Drug use: Yes    Types: Cocaine, Marijuana    Comment: patient states no drugs  . Sexual activity: Not Currently  Lifestyle  . Physical activity:    Days per week: Not on file    Minutes per session: Not on file  . Stress: Not on file  Relationships  . Social connections:    Talks on phone: Not on file    Gets together: Not on file    Attends religious service: Not on file    Active member of club or organization: Not on file    Attends meetings of clubs or organizations: Not on file    Relationship status: Not on file  . Intimate partner violence:    Fear of current or ex partner: Not on file    Emotionally abused: Not on file    Physically abused: Not on file    Forced sexual activity: Not on file  Other Topics Concern  . Not on file  Social History Narrative   Lives at home in [place] by himself.   Independent on ambulation but does not routinely exercise.      Review of Systems: See HPI,  otherwise negative ROS  Physical Exam: BP 102/62   Pulse 88   Temp 97.6 F (36.4 C) (Tympanic)   Resp 20   Ht 5\' 8"  (1.727 m)   Wt 40 kg   SpO2 99%   BMI 13.41 kg/m  General:   Alert,  pleasant and cooperative in NAD Head:  Normocephalic and atraumatic. Neck:  Supple; no masses or thyromegaly. Lungs:  Clear throughout to auscultation, normal respiratory effort.    Heart:  +S1, +S2, Regular rate and rhythm, No edema. Abdomen:  Soft, nontender and nondistended. Normal bowel sounds, without guarding, and without rebound.   Neurologic:  Alert and  oriented x4;  grossly normal neurologically.  Impression/Plan: Tommaso Cavitt is here for an endoscopy  to be performed for  evaluation of gi bleed    Risks, benefits, limitations, and alternatives regarding endoscopy have been reviewed with the patient.  Questions have been answered.  All parties agreeable.   Jonathon Bellows, MD  04/20/2018, 10:25 AM

## 2018-04-20 NOTE — Anesthesia Preprocedure Evaluation (Signed)
Anesthesia Evaluation  Patient identified by MRN, date of birth, ID band Patient awake    Reviewed: Allergy & Precautions, NPO status , Patient's Chart, lab work & pertinent test results, reviewed documented beta blocker date and time   Airway Mallampati: II  TM Distance: >3 FB     Dental  (+) Chipped, Upper Dentures, Lower Dentures   Pulmonary shortness of breath, pneumonia, resolved, former smoker,           Cardiovascular hypertension, Pt. on medications and Pt. on home beta blockers + CAD, + Past MI, + Peripheral Vascular Disease and +CHF       Neuro/Psych    GI/Hepatic (+) Hepatitis -  Endo/Other  diabetes, Type 2  Renal/GU Renal disease     Musculoskeletal  (+) Arthritis ,   Abdominal   Peds  Hematology  (+) anemia ,   Anesthesia Other Findings PVCs. EF 30-40.  Reproductive/Obstetrics                             Anesthesia Physical  Anesthesia Plan  ASA: III  Anesthesia Plan: General   Post-op Pain Management:    Induction: Intravenous  PONV Risk Score and Plan: TIVA  Airway Management Planned: Nasal Cannula  Additional Equipment:   Intra-op Plan:   Post-operative Plan:   Informed Consent: I have reviewed the patients History and Physical, chart, labs and discussed the procedure including the risks, benefits and alternatives for the proposed anesthesia with the patient or authorized representative who has indicated his/her understanding and acceptance.       Plan Discussed with: CRNA  Anesthesia Plan Comments:         Anesthesia Quick Evaluation

## 2018-04-20 NOTE — Progress Notes (Signed)
[facility] NAME: [name]    MR#:  [id number]  DATE OF BIRTH:  05/19/1955  SUBJECTIVE:  CHIEF COMPLAINT: Patient was seen after EGD.  Tolerating diet denies any abdominal pain no other episodes of bleeding  REVIEW OF SYSTEMS:  CONSTITUTIONAL: No fever, fatigue or weakness.  Feeling tired EYES: No blurred or double vision.  EARS, NOSE, AND THROAT: No tinnitus or ear pain.  RESPIRATORY: No cough, shortness of breath, wheezing or hemoptysis.  CARDIOVASCULAR: No chest pain, orthopnea, edema.  GASTROINTESTINAL: No nausea, vomiting, diarrhea or abdominal pain.  Reporting melena HEMATOLOGY: No anemia, easy bruising or bleeding SKIN: No rash or lesion. MUSCULOSKELETAL: No joint pain or arthritis.   NEUROLOGIC: No tingling, numbness, weakness.  PSYCHIATRY: No anxiety or depression.   DRUG ALLERGIES:   Allergies  Allergen Reactions  . Sulfa Antibiotics Rash and Shortness Of Breath    VITALS:  Blood pressure (!) 95/51, pulse 84, temperature 98 F (36.7 C), temperature source Oral, resp. rate 18, height 5\' 8"  (1.727 m), weight 40 kg, SpO2 93 %.  PHYSICAL EXAMINATION:  GENERAL:  63 y.o.-year-old patient lying in the bed with no acute distress.  EYES: Pupils equal, round, reactive to light and accommodation. No scleral icterus. Extraocular muscles intact.  HEENT: Head atraumatic, normocephalic. Oropharynx and nasopharynx clear.  NECK:  Supple, no jugular venous distention. No thyroid enlargement, no tenderness.  LUNGS: Normal breath sounds bilaterally, no wheezing, rales,rhonchi or crepitation. No use of accessory muscles of respiration.  CARDIOVASCULAR: S1, S2 normal. No murmurs, rubs, or gallops.  ABDOMEN: Soft, nontender, nondistended. Bowel sounds present.  EXTREMITIES: No pedal edema, cyanosis, or clubbing.  NEUROLOGIC: Lethargic but arousable and answers questions appropriately. Sensation intact. Gait not checked.   PSYCHIATRIC: The patient is alert and oriented x 3.  SKIN: No obvious rash, lesion, or ulcer.    LABORATORY PANEL:   CBC Recent Labs  Lab 04/18/18 0806  04/19/18 1855  WBC 10.2  --   --   HGB 7.3*   < > 9.3*  HCT 22.7*   < > 28.3*  PLT 151  --   --    < > = values in this interval not displayed.   ------------------------------------------------------------------------------------------------------------------  Chemistries  Recent Labs  Lab 04/17/18 1932  04/20/18 0422  NA 141   < > 139  K 4.8   < > 4.3  CL 107   < > 105  CO2 26   < > 26  GLUCOSE 63*   < > 66*  BUN 83*   < > 46*  CREATININE 4.28*   < > 3.61*  CALCIUM 8.1*   < > 8.1*  AST 25  --   --   ALT 18  --   --   ALKPHOS 80  --   --   BILITOT 1.2  --   --    < > = values in this interval not displayed.   ------------------------------------------------------------------------------------------------------------------  Cardiac Enzymes No results for input(s): TROPONINI in the last 168 hours. ------------------------------------------------------------------------------------------------------------------  RADIOLOGY:  No results found.  EKG:   Orders placed or performed during the hospital encounter of 04/17/18  . ED EKG  . ED EKG    ASSESSMENT AND PLAN:    This is a 63 year old male admitted for GI bleed.  1.  GI bleed: Melena; hemodynamically stable.  Received 2 units of blood 04/18/2018 and 1 more unit of blood transfusion 04/19/2018  Maintain 2  large-bore IVs Monitor hemoglobin hematocrit and transfuse as needed. EGD done on 04/20/2018 has revealed nonbleeding cratered duodenal ulcer 10 mm in size   IV Protonix every 12 hours changed to Protonix drip-Protonix p.o. twice daily Check stool for H. pylori antigen Outpatient follow-up with GI in 2 weeks   discussed with Dr. Vicente Males.  Recommended to discharge patient in a.m. if hemoglobin is stable  Benadryl as needed Hemoglobin 7.3-2 units of blood  transfusion-9.5-8.1-7.7-1 unit of blood transfusion -hemoglobin at 9.3 today  2.  CAD: Stable; hold aspirin and nitrates for now.  Monitor telemetry.  3.  Hypertension: Controlled; also hold antihypertensive medication for now  4.  ESRD: On hemodialysis.   Discussed with Dr. Zollie Scale nephrology no need for hemodialysis.  Patient will get hemodialysis today  hold Sensipar and PhosLo until the patient resumes a diet  5.  CHF: Combined systolic and diastolic; stable.  Continue Entresto.  Lasix as needed.  6.  DVT prophylaxis: SCDs 7.  GI prophylaxis: Protonix     All the records are reviewed and case discussed with Care Management/Social Workerr. Management plans discussed with the patient, he is in agreement.  CODE STATUS: fc  TOTAL TIME TAKING CARE OF THIS PATIENT: 35 minutes.   POSSIBLE D/C IN 1 DAYS, DEPENDING ON CLINICAL CONDITION.  Note: This dictation was prepared with Dragon dictation along with smaller phrase technology. Any transcriptional errors that result from this process are unintentional.   Nicholes Mango M.D on 04/20/2018 at 3:35 PM  Between 7am to 6pm - Pager - [phone] After 6pm go to [email or web address] - password [email or web address] [facility]  [place] [facility]  Office  [phone]  CC: Primary care physician; Donnie Coffin, MD

## 2018-04-20 NOTE — Anesthesia Post-op Follow-up Note (Signed)
Anesthesia QCDR form completed.        

## 2018-04-20 NOTE — Progress Notes (Signed)
Pt notified MD pt is refusing renal diet. Per Dr. Margaretmary Eddy okay for RN to change pt's diet order to regular diet with 1200 ML fluid restriction.

## 2018-04-20 NOTE — Op Note (Signed)
[name] Patient Name: [name] Procedure Date: 04/20/2018 10:25 AM MRN: [id number] Account #: [id number] Date of Birth: 02-10-1956 Admit Type: Inpatient Age: 63 Room: [facility] ENDO ROOM 4 Gender: Male Note Status: Finalized Procedure:            Upper GI endoscopy Indications:          Hematochezia Providers:            Jonathon Bellows MD, MD Referring MD:         Edmonia Lynch. Aycock MD (Referring MD) Medicines:            Monitored Anesthesia Care Complications:        No immediate complications. Procedure:            Pre-Anesthesia Assessment:                       - Prior to the procedure, a History and Physical was                        performed, and patient medications, allergies and                        sensitivities were reviewed. The patient's tolerance of                        previous anesthesia was reviewed.                       - The risks and benefits of the procedure and the                        sedation options and risks were discussed with the                        patient. All questions were answered and informed                        consent was obtained.                       - ASA Grade Assessment: IV - A patient with severe                        systemic disease that is a constant threat to life.                       After obtaining informed consent, the endoscope was                        passed under direct vision. Throughout the procedure,                        the patient's blood pressure, pulse, and oxygen                        saturations were monitored continuously. The Endoscope                        was introduced through the mouth, and advanced to the  third part of duodenum. The upper GI endoscopy was                        accomplished with ease. The patient tolerated the                        procedure well. Findings:      The stomach was normal.      A single 6 mm mucosal  nodule with a localized distribution was found at       the gastroesophageal junction.      The stomach was normal.      The cardia and gastric fundus were normal on retroflexion.      One non-bleeding cratered duodenal ulcer with a clean ulcer base       (Forrest Class III) was found in the duodenal bulb. The lesion was 10 mm       in largest dimension.      Localized moderate inflammation characterized by congestion (edema),       erosions and erythema was found in the duodenal bulb. Impression:           - Normal stomach.                       - Mucosal nodule found in the esophagus.                       - Normal stomach.                       - One non-bleeding duodenal ulcer with a clean ulcer                        base (Forrest Class III).                       - Duodenitis.                       - No specimens collected. Recommendation:       - Return patient to hospital ward for ongoing care.                       - Advance diet as tolerated.                       - Continue present medications.                       - 1. Check H pylori stool antigen                       2. No NSAID's                       3. PPI BID                       4. Monitor CBC and trasnfuse as needed                       5. Advance diet                       - Outpatient evaluation of  esophageal nodule Procedure Code(s):    --- Professional ---                       [phone], Esophagogastroduodenoscopy, flexible, transoral;                        diagnostic, including collection of specimen(s) by                        brushing or washing, when performed (separate procedure) Diagnosis Code(s):    --- Professional ---                       K22.8, Other specified diseases of esophagus                       K26.9, Duodenal ulcer, unspecified as acute or chronic,                        without hemorrhage or perforation                       K29.80, Duodenitis without bleeding                        K92.1, Melena (includes Hematochezia) CPT copyright 2018 [employer]. All rights reserved. The codes documented in this report are preliminary and upon coder review may  be revised to meet current compliance requirements. Jonathon Bellows, MD Jonathon Bellows MD, MD 04/20/2018 10:38:40 AM This report has been signed electronically. Number of Addenda: 0 Note Initiated On: 04/20/2018 10:25 AM      [facility]

## 2018-04-20 NOTE — Transfer of Care (Signed)
Immediate Anesthesia Transfer of Care Note  Patient: [name]  Procedure(s) Performed: ESOPHAGOGASTRODUODENOSCOPY (EGD) WITH PROPOFOL (N/A )  Patient Location: PACU and Endoscopy Unit  Anesthesia Type:General  Level of Consciousness: awake  Airway & Oxygen Therapy: Patient Spontanous Breathing and Patient connected to nasal cannula oxygen  Post-op Assessment: Report given to RN and Post -op Vital signs reviewed and stable  Post vital signs: Reviewed and stable  Last Vitals:  Vitals Value Taken Time  BP 98/47 04/20/2018 10:54 AM  Temp 36.4 C 04/20/2018 10:43 AM  Pulse 82 04/20/2018 10:54 AM  Resp 13 04/20/2018 10:54 AM  SpO2 100 % 04/20/2018 10:54 AM  Vitals shown include unvalidated device data.  Last Pain:  Vitals:   04/20/18 1043  TempSrc: Tympanic  PainSc: 0-No pain         Complications: No apparent anesthesia complications

## 2018-04-21 ENCOUNTER — Inpatient Hospital Stay: Payer: Medicare Other

## 2018-04-21 ENCOUNTER — Encounter: Payer: Self-pay | Admitting: Gastroenterology

## 2018-04-21 LAB — CBC
HCT: 28.3 % — ABNORMAL LOW (ref 39.0–52.0)
HEMOGLOBIN: 9.1 g/dL — AB (ref 13.0–17.0)
MCH: 30 pg (ref 26.0–34.0)
MCHC: 32.2 g/dL (ref 30.0–36.0)
MCV: 93.4 fL (ref 80.0–100.0)
Platelets: 151 10*3/uL (ref 150–400)
RBC: 3.03 MIL/uL — ABNORMAL LOW (ref 4.22–5.81)
RDW: 17.4 % — ABNORMAL HIGH (ref 11.5–15.5)
WBC: 8.9 10*3/uL (ref 4.0–10.5)
nRBC: 0.3 % — ABNORMAL HIGH (ref 0.0–0.2)

## 2018-04-21 LAB — PHOSPHORUS: Phosphorus: 2.9 mg/dL (ref 2.5–4.6)

## 2018-04-21 MED ORDER — DIPHENHYDRAMINE HCL 25 MG PO CAPS: 25.0000 mg | ORAL_CAPSULE | Freq: Four times a day (QID) | ORAL | 0 refills | Status: DC | PRN

## 2018-04-21 MED ORDER — ASPIRIN EC 81 MG PO TBEC: 81.0000 mg | DELAYED_RELEASE_TABLET | Freq: Every day | ORAL | 2 refills | Status: DC

## 2018-04-21 MED ORDER — OMEPRAZOLE 40 MG PO CPDR: 40.0000 mg | DELAYED_RELEASE_CAPSULE | Freq: Two times a day (BID) | ORAL | 1 refills | Status: DC

## 2018-04-21 MED ORDER — LIDOCAINE 5 % EX PTCH: 1.0000 | MEDICATED_PATCH | CUTANEOUS | 0 refills | Status: DC

## 2018-04-21 MED ORDER — FERROUS SULFATE 325 (65 FE) MG PO TBEC: 325.0000 mg | DELAYED_RELEASE_TABLET | Freq: Two times a day (BID) | ORAL | 2 refills | Status: DC

## 2018-04-21 MED ORDER — LIDOCAINE 5 % EX PTCH
1.0000 | MEDICATED_PATCH | CUTANEOUS | Status: DC
  Administered 2018-04-21: 1 via TRANSDERMAL
  Filled 2018-04-21: qty 1

## 2018-04-21 MED ORDER — RENA-VITE PO TABS: 1.0000 | ORAL_TABLET | Freq: Every day | ORAL | 0 refills | Status: DC

## 2018-04-21 MED ORDER — NEPRO/CARBSTEADY PO LIQD: 237.0000 mL | Freq: Two times a day (BID) | ORAL | 0 refills | Status: DC

## 2018-04-21 MED ORDER — DOCUSATE SODIUM 100 MG PO CAPS: 100.0000 mg | ORAL_CAPSULE | Freq: Two times a day (BID) | ORAL | 0 refills | Status: DC | PRN

## 2018-04-21 MED ORDER — TRAMADOL HCL 50 MG PO TABS: 50.0000 mg | ORAL_TABLET | Freq: Four times a day (QID) | ORAL | 0 refills | Status: AC | PRN

## 2018-04-21 MED ORDER — TRAMADOL HCL 50 MG PO TABS
50.0000 mg | ORAL_TABLET | Freq: Four times a day (QID) | ORAL | Status: DC | PRN
  Administered 2018-04-21: 50 mg via ORAL
  Filled 2018-04-21: qty 1

## 2018-04-21 NOTE — Progress Notes (Signed)
Post HD Tx   04/21/18 1956  Post-Hemodialysis Assessment  Rinseback Volume (mL) 250 mL  Dialyzer Clearance Lightly streaked  Duration of HD Treatment -hour(s) 3.5 hour(s)  Hemodialysis Intake (mL) 500 mL  UF Total -Machine (mL) 2500 mL  Net UF (mL) 2000 mL  Tolerated HD Treatment Yes  AVG/AVF Arterial Site Held (minutes) 10 minutes  AVG/AVF Venous Site Held (minutes) 10 minutes  Fistula / Graft Left Forearm Arteriovenous fistula  Placement Date: 09/20/16   Placed prior to admission: Yes  Orientation: Left  Access Location: Forearm  Access Type: Arteriovenous fistula  Site Condition No complications  Fistula / Graft Assessment Bruit;Thrill;Present  Status Deaccessed  Drainage Description None

## 2018-04-21 NOTE — Progress Notes (Signed)
HD Tx End   04/21/18 1954  Vital Signs  Temp 98.4 F (36.9 C)  Temp Source Oral  Pulse Rate 66  Pulse Rate Source Monitor  Resp 16  BP (!) 104/55  BP Location Right Arm  BP Method Automatic  Patient Position (if appropriate) Lying  Oxygen Therapy  SpO2 100 %  O2 Device Room Air  Dialysis Weight  Weight 74.5 kg  Type of Weight Post-Dialysis  During Hemodialysis Assessment  Blood Flow Rate (mL/min) 200 mL/min  Arterial Pressure (mmHg) -180 mmHg  Venous Pressure (mmHg) 170 mmHg  Transmembrane Pressure (mmHg) 40 mmHg  Ultrafiltration Rate (mL/min) 670 mL/min  Dialysate Flow Rate (mL/min) 80 ml/min  Conductivity: Machine  14  HD Safety Checks Performed Yes  KECN  (77.4)  Dialysis Fluid Bolus Normal Saline  Bolus Amount (mL) 250 mL  Intra-Hemodialysis Comments Tx completed;Tolerated well

## 2018-04-21 NOTE — Progress Notes (Signed)
Pre HD Assessment   04/21/18 1625  Neurological  Level of Consciousness Alert  Orientation Level Oriented X4  Respiratory  Respiratory Pattern Regular  Chest Assessment Chest expansion symmetrical  Bilateral Breath Sounds Clear;Diminished  Cardiac  Pulse Regular  ECG Monitor Yes  Vascular  R Radial Pulse +2  L Radial Pulse +2  Integumentary  Integumentary (WDL) X  Skin Color Appropriate for ethnicity  Skin Condition Dry  Additional Integumentary Comments  (HD pt)  Musculoskeletal  Musculoskeletal (WDL) X  Generalized Weakness Yes  Psychosocial  Psychosocial (WDL) WDL

## 2018-04-21 NOTE — Care Management Important Message (Signed)
Copy of signed Medicare IM left with patient in room. 

## 2018-04-21 NOTE — Discharge Summary (Addendum)
[facility] NAME: [name]    MR#:  [id number]  DATE OF BIRTH:  Jul 15, 1955  DATE OF ADMISSION:  04/17/2018 ADMITTING PHYSICIAN: Harrie Foreman, MD  DATE OF DISCHARGE:  04/21/18   PRIMARY CARE PHYSICIAN: Donnie Coffin, MD    ADMISSION DIAGNOSIS:  Melena [K92.1] ESRD on hemodialysis ([place]) [N18.6, Z99.2] Symptomatic anemia [D64.9] Hypotension, unspecified hypotension type [I95.9]  DISCHARGE DIAGNOSIS:  Active Problems:   GI bleed Nonbleeding duodenal ulcer  SECONDARY DIAGNOSIS:   Past Medical History:  Diagnosis Date  . Arthritis   . CAD (coronary artery disease)    a. 09/2013 Myoview [facility]): basal inf defect more pronounced @ rest, likely artifact, EF 48%, low risk study; b. 06/2016 Cath [facility]): LAD 10p, 30-93m, 40-50d, D1 90(small), D2 40, D3 60-70, D4 30, LCX 10p, 9m, 20d, Om1 30, OM3 40, RCA 100p w/ bridging collats, m/d RCA fill via collats, small/mod caliber-->Med Rx; c. 02/2017 MV: inf infarct w/ peri-inf ischemia. EF <30%.   . Chronic combined systolic and diastolic CHF (congestive heart failure) ([place])    a. 10/2013 Echo [facility]): EF 50%, diast dysfxn;  b. 10/2014 Echo: EF 30-35%, diff HK, gr1 DD;  c. 04/2016 Echo: EF 20-25%, sev dil LV, diff HK, gr3 DD; d. 06/2016 Echo: EF 30-35%, Gr2 DD.  . Diabetes ([place])   . Dyspnea   . ESRD (end stage renal disease) ([place])    a. MWF dialysis @ [facility].  . Hepatitis    Patient is unsure type of Hepatitis he has  . Hypertension   . Hypertensive heart disease with CHF (congestive heart failure) ([place])   . Mixed Ischemic and non-ischemic cardiomyopathy    a. 10/2013 Echo [facility]): EF 50%, diast dysfxn;  b. 10/2014 Echo: EF 30-35%, diff HK, gr1 DD;  c. 04/2016 Echo: EF 20-25%, sev dil LV, diff HK, gr3 DD; d. 06/2016 Echo: EF 30-35%, Gr2 DD.  [name] Moderate Aortic insufficiency    a. 04/2016 Echo: Mod AI.  [name] Myocardial infarction ([place])   . Peripheral vascular disease ([place])   .  Pulmonary hypertension ([place])    a. 10/2014 Echo: Mod-Sev PAH.  [name] PVC's (premature ventricular contractions)    a. 02/2017 24 hr holter: freq polymorphic PVCs w/ total of 38K beats in 24 hrs (34% burden).    HOSPITAL COURSE:  HPI: The patient with past medical history significant for ischemic heart disease, combined systolic and diastolic CHF, diabetes, end-stage renal disease on dialysis, hypertension and history of duodenal ulcer presents to the emergency department from dialysis after developing dark stools.  The patient does not contribute much of his history this examiner due to somnolence and/or reticence.  Laboratory evaluation revealed 6 g drop in hemoglobin.  Blood transfusion was started in the emergency department prior to the hospitalist service being called for admission.   Hospital course  1. GI bleed: Melena; hemodynamically stable.  Received 2 units of blood 04/18/2018 and 1 more unit of blood transfusion 04/19/2018 Maintained 2 large-bore IVs during the hospital course Monitor hemoglobin hematocrit and transfuse as needed. EGD done on 04/20/2018 has revealed nonbleeding cratered duodenal ulcer 10 mm in size  IV Protonix every 12 hours changed to Protonix drip-Protonix p.o. twice daily Stop taking NSAIDs like ibuprofen or Advil or Aleve over-the-counter Check stool for H. pylori antigen Outpatient follow-up with GI in 2 weeks  discussed with Dr. Vicente Males.  Recommended to discharge patient in a.m. if hemoglobin is stable  Benadryl as needed Hemoglobin 7.3-2 units of blood transfusion-9.5-8.1-7.7-1 unit of blood transfusion -hemoglobin at 9.3-8.8-9.1 today  2. CAD: Stable; hold aspirin and nitrates for now. Monitor telemetry.  3. Hypertension: Blood pressure low  hold antihypertensive medication and Imdur for now  4. ESRD: On hemodialysis.  Discussed with Dr. Zollie Scale nephrology no need for hemodialysis.  Patient will get hemodialysis today  Sensipar and PhosLo to be  continued  5. CHF: Combined systolic and diastolic; stable. Continue Entresto. Lasix as needed.  6. Low back pain Xray-no acute findings kpad prn, lidocaine patch 12hrson and 12 hrs off Tramadol prn   6. DVT prophylaxis: SCDs 7. GI prophylaxis: Protonix   DISCHARGE CONDITIONS:   Stable  CONSULTS OBTAINED:  Treatment Team:  Jonathon Bellows, MD Anthonette Legato, MD   PROCEDURES EGD with duodenal ulcer which is nonbleeding  DRUG ALLERGIES:   Allergies  Allergen Reactions  . Sulfa Antibiotics Rash and Shortness Of Breath    DISCHARGE MEDICATIONS:   Allergies as of 04/21/2018      Reactions   Sulfa Antibiotics Rash, Shortness Of Breath      Medication List    STOP taking these medications   aspirin 81 MG chewable tablet Replaced by:  aspirin EC 81 MG tablet   carvedilol 6.25 MG tablet Commonly known as:  COREG   furosemide 40 MG tablet Commonly known as:  LASIX   hydrALAZINE 50 MG tablet Commonly known as:  APRESOLINE   isosorbide mononitrate 30 MG 24 hr tablet Commonly known as:  IMDUR   oxyCODONE-acetaminophen 5-325 MG tablet Commonly known as:  PERCOCET   predniSONE 10 MG tablet Commonly known as:  DELTASONE   sacubitril-valsartan 24-26 MG Commonly known as:  ENTRESTO     TAKE these medications   acetaminophen 325 MG tablet Commonly known as:  TYLENOL Take 2 tablets (650 mg total) by mouth every 6 (six) hours as needed for mild pain (or Fever >/= 101). What changed:    how much to take  when to take this   albuterol 108 (90 Base) MCG/ACT inhaler Commonly known as:  PROVENTIL HFA;VENTOLIN HFA Inhale 2 puffs into the lungs every 4 (four) hours as needed for wheezing or shortness of breath.   amiodarone 200 MG tablet Commonly known as:  PACERONE Take 1 tablet (200 mg total) by mouth daily.   aspirin EC 81 MG tablet Take 1 tablet (81 mg total) by mouth daily. Replaces:  aspirin 81 MG chewable tablet   atorvastatin 40 MG  tablet Commonly known as:  LIPITOR Take 1 tablet (40 mg total) by mouth daily. What changed:  when to take this   calcium acetate 667 MG capsule Commonly known as:  PHOSLO Take 1,334-2,001 mg by mouth 3 (three) times daily with meals. take 3 capsules (2001mg ) by mouth three times daily before each meal, and 2 capsules (667mg ) by mouth with snacks twice daily   cinacalcet 90 MG tablet Commonly known as:  SENSIPAR Take 60 mg by mouth daily.   cyclobenzaprine 5 MG tablet Commonly known as:  FLEXERIL Take 5 mg by mouth daily.   diphenhydrAMINE 25 mg capsule Commonly known as:  BENADRYL Take 1 capsule (25 mg total) by mouth every 6 (six) hours as needed for itching or allergies.   docusate sodium 100 MG capsule Commonly known as:  COLACE Take 1 capsule (100 mg total) by mouth 2 (two) times daily as needed for mild constipation.   feeding supplement (NEPRO CARB STEADY) Liqd Take 237 mLs  by mouth 2 (two) times daily between meals.   ferrous sulfate 325 (65 FE) MG EC tablet Take 1 tablet (325 mg total) by mouth 2 (two) times daily.   ipratropium 17 MCG/ACT inhaler Commonly known as:  ATROVENT HFA Inhale 2 puffs into the lungs 4 (four) times daily as needed for wheezing.   lidocaine 5 % Commonly known as:  LIDODERM Place 1 patch onto the skin daily. Remove & Discard patch within 12 hours or as directed by MD   lidocaine-prilocaine cream Commonly known as:  EMLA Apply 1 application topically as needed (dialysis access).   multivitamin Tabs tablet Take 1 tablet by mouth at bedtime.   nitroGLYCERIN 0.4 MG SL tablet Commonly known as:  NITROSTAT Place 0.4 mg under the tongue every 5 (five) minutes as needed for chest pain.   omeprazole 40 MG capsule Commonly known as:  PRILOSEC Take 1 capsule (40 mg total) by mouth 2 (two) times daily. What changed:  when to take this   OXYGEN Inhale into the lungs daily as needed. Wears O2 at 2L [place] at home at night and as needed in the  day   traMADol 50 MG tablet Commonly known as:  ULTRAM Take 1 tablet (50 mg total) by mouth every 6 (six) hours as needed for moderate pain or severe pain.        DISCHARGE INSTRUCTIONS:   Follow-up with primary care physician in 3 days Follow-up with gastroenterology Dr. Vicente Males in 2 weeks Follow-up with nephrology in 5 to 7 days and continue hemodialysis as scheduled  DIET:  Renal diet  DISCHARGE CONDITION:  Fair  ACTIVITY:  Activity as tolerated and No driving for 2 weeks  OXYGEN:  Home Oxygen: No.   Oxygen Delivery: room air  DISCHARGE LOCATION:  home   If you experience worsening of your admission symptoms, develop shortness of breath, life threatening emergency, suicidal or homicidal thoughts you must seek medical attention immediately by calling 911 or calling your MD immediately  if symptoms less severe.  You Must read complete instructions/literature along with all the possible adverse reactions/side effects for all the Medicines you take and that have been prescribed to you. Take any new Medicines after you have completely understood and accpet all the possible adverse reactions/side effects.   Please note  You were cared for by a hospitalist during your hospital stay. If you have any questions about your discharge medications or the care you received while you were in the hospital after you are discharged, you can call the unit and asked to speak with the hospitalist on call if the hospitalist that took care of you is not available. Once you are discharged, your primary care physician will handle any further medical issues. Please note that NO REFILLS for any discharge medications will be authorized once you are discharged, as it is imperative that you return to your primary care physician (or establish a relationship with a primary care physician if you do not have one) for your aftercare needs so that they can reassess your need for medications and monitor your lab  values.     Today  Chief Complaint  Patient presents with  . Rectal Bleeding  . Epistaxis   Patient is feeling fine.  No other episodes of bleeding.  Hemoglobin is stable and wants to go home.  Okay to discharge patient from GI standpoint. Pt want to go home after HD  ROS:  CONSTITUTIONAL: Denies fevers, chills. Denies any fatigue, weakness.  EYES:  Denies blurry vision, double vision, eye pain. EARS, NOSE, THROAT: Denies tinnitus, ear pain, hearing loss. RESPIRATORY: Denies cough, wheeze, shortness of breath.  CARDIOVASCULAR: Denies chest pain, palpitations, edema.  GASTROINTESTINAL: Denies nausea, vomiting, diarrhea, abdominal pain. Denies bright red blood per rectum. GENITOURINARY: Denies dysuria, hematuria. ENDOCRINE: Denies nocturia or thyroid problems. HEMATOLOGIC AND LYMPHATIC: Denies easy bruising or bleeding. SKIN: Denies rash or lesion. MUSCULOSKELETAL: Denies pain in neck, back, shoulder, knees, hips or arthritic symptoms.  NEUROLOGIC: Denies paralysis, paresthesias.  PSYCHIATRIC: Denies anxiety or depressive symptoms.   VITAL SIGNS:  Blood pressure (!) 92/50, pulse 76, temperature 98.3 F (36.8 C), temperature source Oral, resp. rate 20, height 5\' 8"  (1.727 m), weight 40 kg, SpO2 96 %.  I/O:    Intake/Output Summary (Last 24 hours) at 04/21/2018 1147 Last data filed at 04/21/2018 1443 Gross per 24 hour  Intake 1090 ml  Output 0 ml  Net 1090 ml    PHYSICAL EXAMINATION:  GENERAL:  63 y.o.-year-old patient lying in the bed with no acute distress.  EYES: Pupils equal, round, reactive to light and accommodation. No scleral icterus. Extraocular muscles intact.  HEENT: Head atraumatic, normocephalic. Oropharynx and nasopharynx clear.  NECK:  Supple, no jugular venous distention. No thyroid enlargement, no tenderness.  LUNGS: Normal breath sounds bilaterally, no wheezing, rales,rhonchi or crepitation. No use of accessory muscles of respiration.  CARDIOVASCULAR: S1,  S2 normal. No murmurs, rubs, or gallops.  ABDOMEN: Soft, non-tender, non-distended. Bowel sounds present.  EXTREMITIES: No pedal edema, cyanosis, or clubbing.  NEUROLOGIC: awake alert and oriented x3. Sensation intact. Gait not checked.  PSYCHIATRIC: The patient is alert and oriented x 3.  SKIN: No obvious rash, lesion, or ulcer.   DATA REVIEW:   CBC Recent Labs  Lab 04/21/18 0520  WBC 8.9  HGB 9.1*  HCT 28.3*  PLT 151    Chemistries  Recent Labs  Lab 04/17/18 1932  04/20/18 0422  NA 141   < > 139  K 4.8   < > 4.3  CL 107   < > 105  CO2 26   < > 26  GLUCOSE 63*   < > 66*  BUN 83*   < > 46*  CREATININE 4.28*   < > 3.61*  CALCIUM 8.1*   < > 8.1*  AST 25  --   --   ALT 18  --   --   ALKPHOS 80  --   --   BILITOT 1.2  --   --    < > = values in this interval not displayed.    Cardiac Enzymes No results for input(s): TROPONINI in the last 168 hours.  Microbiology Results  Results for orders placed or performed during the hospital encounter of 04/17/18  MRSA PCR Screening     Status: None   Collection Time: 04/18/18  4:39 AM  Result Value Ref Range Status   MRSA by PCR NEGATIVE NEGATIVE Final    Comment:        The GeneXpert MRSA Assay (FDA approved for NASAL specimens only), is one component of a comprehensive MRSA colonization surveillance program. It is not intended to diagnose MRSA infection nor to guide or monitor treatment for MRSA infections. Performed at [facility], [address] [place], [place] [address]     RADIOLOGY:  No results found.  EKG:   Orders placed or performed during the hospital encounter of 04/17/18  . ED EKG  . ED EKG  Management plans discussed with the patient, family and they are in agreement.  CODE STATUS:     Code Status Orders  (From admission, onward)         Start     Ordered   04/18/18 0421  Full code  Continuous     04/18/18 0420        Code Status History    Date Active Date  Inactive Code Status Order ID Comments User Context   02/08/2018 0233 02/08/2018 2214 Full Code [id number]  Lance Coon, MD ED   03/14/2017 0007 03/14/2017 2204 Full Code [id number]  Amelia Jo, MD Inpatient   02/15/2017 2136 02/18/2017 1812 Full Code [id number]  Demetrios Loll, MD Inpatient   09/20/2016 0318 09/21/2016 1915 Full Code [id number]  Lance Coon, MD ED   08/22/2016 1658 08/24/2016 1607 Full Code [id number]  Demetrios Loll, MD Inpatient   07/04/2016 1929 07/05/2016 1728 Full Code [id number]  Idelle Crouch, MD Inpatient   06/07/2016 0102 06/08/2016 1514 Full Code [id number]  Hugelmeyer, Wilmar, DO Inpatient   04/30/2016 0007 05/01/2016 1821 Full Code [id number]  Lance Coon, MD Inpatient   02/26/2016 2107 02/28/2016 1804 Full Code [id number]  Lance Coon, MD Inpatient   01/07/2016 0132 01/07/2016 1724 Full Code [id number]  Conyers, Cook, DO ED   08/02/2015 2316 08/04/2015 2311 Full Code [id number]  Quintella Baton, MD Inpatient   12/02/2014 2052 12/04/2014 1753 Full Code [id number]  Vaughan Basta, MD Inpatient   11/10/2014 2323 11/12/2014 1506 Full Code [id number]  Gladstone Lighter, MD Inpatient      TOTAL TIME TAKING CARE OF THIS PATIENT: 43  minutes.   Note: This dictation was prepared with Dragon dictation along with smaller phrase technology. Any transcriptional errors that result from this process are unintentional.   @[facility] @  on 04/21/2018 at 11:47 AM  Between 7am to 6pm - Pager - [phone]  After 6pm go to [email or web address] - password [email or web address] [facility]  [facility]  Office  [phone]  CC: Primary care physician; Donnie Coffin, MD

## 2018-04-21 NOTE — Progress Notes (Signed)
Pre HD Assessment   04/21/18 1625  Hand-Off documentation  Report given to (Full Name) Trellis Paganini RN  Report received from (Full Name) Rochel Brome RN 2C  Vital Signs  Temp 98 F (36.7 C)  Temp Source Oral  Pulse Rate 70  Pulse Rate Source Monitor  Resp 18  BP (!) 83/45  BP Location Right Arm  BP Method Automatic  Patient Position (if appropriate) Lying  Oxygen Therapy  SpO2 100 %  O2 Device Room Air  Pain Assessment  Pain Scale 0-10  Pain Score 0  Dialysis Weight  Weight 75.5 kg  Type of Weight Pre-Dialysis  Time-Out for Hemodialysis  What Procedure? Hemodialysis   Pt Identifiers(min of two) First/Last Name;MRN/Account#  Correct Site? Yes  Correct Side? Yes  Correct Procedure? Yes  Consents Verified? Yes  Rad Studies Available? No  Safety Precautions Reviewed? Yes  Engineer, civil (consulting) Number 3  Station Number 1  UF/Alarm Test Passed  Conductivity: Meter 14  Conductivity: Machine  14  pH 7.4  Reverse Osmosis main   Normal Saline Lot Number [id number]  Dialyzer Lot Number [id number]  Disposable Set Lot Number [id number]  Machine Temperature 98.6 F (37 C)  Musician and Audible Yes  Blood Lines Intact and Secured Yes  Pre Treatment Patient Checks  Vascular access used during treatment Graft  Hepatitis B Surface Antigen Results Negative  Date Hepatitis B Surface Antigen Drawn 04/18/18  Hepatitis B Surface Antibody  (>10)  Date Hepatitis B Surface Antibody Drawn 04/18/18  Hemodialysis Standing Orders Initiated Yes  ECG (Telemetry) Monitor On Yes  Prime Ordered Normal Saline  Length of  DialysisTreatment -hour(s) 3.5 Hour(s)  Dialysis Treatment Comments pt stable  Dialyzer Elisio 17H NR  Dialysate 2K, 2.5 Ca  Dialysate Flow Ordered 800  Ultrafiltration Goal 1.5 Liters  Pre Treatment Labs Phosphorus  Dialysis Blood Pressure Support Ordered Normal Saline  Education / Care Plan  Dialysis Education Provided Yes  Documented Education in Care Plan Yes   Fistula / Graft Left Forearm Arteriovenous fistula  Placement Date: 09/20/16   Placed prior to admission: Yes  Orientation: Left  Access Location: Forearm  Access Type: Arteriovenous fistula  Site Condition No complications  Fistula / Graft Assessment Bruit;Thrill;Present  Status Accessed  Needle Size 15G  Drainage Description None

## 2018-04-21 NOTE — Progress Notes (Signed)
Patient reading 97% saturation on room air while resting. He would not get up to allow me to check his sats while ambulating.  More complaints of back pain.  lidoderm patch and k pad applied

## 2018-04-21 NOTE — Discharge Instructions (Signed)
Follow-up with primary care physician in 3 days Follow-up with gastroenterology Dr. Vicente Males in 2 weeks Follow-up with nephrology in 5 to 7 days and continue hemodialysis as scheduled

## 2018-04-21 NOTE — Progress Notes (Signed)
Pt back to unit from HD, 2 L off, VSS. Pt had dinner and called son to pick him up. DC documentation given to PT.Pt taken down on wheelchair by staff with personal items to meet son at front door.

## 2018-04-21 NOTE — Care Management (Addendum)
Elvera Bicker dialysis liaison notified of discharge  Per nursing staff patient has been weaned to RA  Update: RNCM confirmed with Leroy Sea from [place] that patient already has continuous O2 through [facility].

## 2018-04-21 NOTE — Progress Notes (Signed)
HD Tx Start   04/21/18 1640  Vital Signs  Pulse Rate 65  Resp 18  BP 96/60  Oxygen Therapy  SpO2 100 %  During Hemodialysis Assessment  Blood Flow Rate (mL/min) 400 mL/min  Arterial Pressure (mmHg) -170 mmHg  Venous Pressure (mmHg) 160 mmHg  Transmembrane Pressure (mmHg) 50 mmHg  Ultrafiltration Rate (mL/min) 660 mL/min  Dialysate Flow Rate (mL/min) 800 ml/min  Conductivity: Machine  14  HD Safety Checks Performed Yes  Dialysis Fluid Bolus Normal Saline  Bolus Amount (mL) 250 mL  Intra-Hemodialysis Comments Tx initiated

## 2018-04-21 NOTE — Progress Notes (Signed)
[facility]  ROUNDING NOTE   Subjective:  Patient was found to have duodenal ulcer on EGD. Due for hemodialysis today.   Objective:  Vital signs in last 24 hours:  Temp:  [98.3 F (36.8 C)-99.5 F (37.5 C)] 98.3 F (36.8 C) (02/28 0530) Pulse Rate:  [71-87] 76 (02/28 0935) Resp:  [20] 20 (02/28 0530) BP: (92-103)/(50-63) 92/50 (02/28 0935) SpO2:  [96 %-99 %] 97 % (02/28 1209)  Weight change:  Filed Weights   04/18/18 1640 04/19/18 0051 04/19/18 1015  Weight: 59.8 kg 40.1 kg 40 kg    Intake/Output: I/O last 3 completed shifts: In: 950 [P.O.:800; I.V.:100; IV Piggyback:50] Out: 0    Intake/Output this shift:  Total I/O In: 480 [P.O.:480] Out: -   Physical Exam: General: No acute distress  Head: Normocephalic, atraumatic. Moist oral mucosal membranes  Eyes: Anicteric  Neck: Supple, trachea midline  Lungs:  Clear to auscultation, normal effort  Heart: S1S2 no rubs  Abdomen:  Soft, nontender, bowel sounds present  Extremities: No peripheral edema.  Neurologic: Awake, alert, following commands  Skin: No lesions  Access: LUE AVG    Basic Metabolic Panel: Recent Labs  Lab 04/17/18 1932 04/18/18 0806 04/19/18 0035 04/20/18 0422  NA 141 142 138 139  K 4.8 5.4* 4.2 4.3  CL 107 111 107 105  CO2 26 20* 23 26  GLUCOSE 63* 72 102* 66*  BUN 83* 134* 92* 46*  CREATININE 4.28* 4.97* 4.17* 3.61*  CALCIUM 8.1* 7.6* 7.5* 8.1*    Liver Function Tests: Recent Labs  Lab 04/17/18 1932  AST 25  ALT 18  ALKPHOS 80  BILITOT 1.2  PROT 5.9*  ALBUMIN 2.8*   No results for input(s): LIPASE, AMYLASE in the last 168 hours. No results for input(s): AMMONIA in the last 168 hours.  CBC: Recent Labs  Lab 04/17/18 1932 04/18/18 0806  04/19/18 0035 04/19/18 0603 04/19/18 1855 04/20/18 1927 04/21/18 0520  WBC 9.2 10.2  --   --   --   --   --  8.9  HGB 8.5* 7.3*   < > 8.1* 7.7* 9.3* 8.8* 9.1*  HCT 27.3* 22.7*   < > 24.0* 22.9* 28.3* 27.3* 28.3*  MCV  95.5 95.0  --   --   --   --   --  93.4  PLT 153 151  --   --   --   --   --  151   < > = values in this interval not displayed.    Cardiac Enzymes: No results for input(s): CKTOTAL, CKMB, CKMBINDEX, TROPONINI in the last 168 hours.  BNP: Invalid input(s): POCBNP  CBG: Recent Labs  Lab 04/20/18 0725  GLUCAP 103*    Microbiology: Results for orders placed or performed during the hospital encounter of 04/17/18  MRSA PCR Screening     Status: None   Collection Time: 04/18/18  4:39 AM  Result Value Ref Range Status   MRSA by PCR NEGATIVE NEGATIVE Final    Comment:        The GeneXpert MRSA Assay (FDA approved for NASAL specimens only), is one component of a comprehensive MRSA colonization surveillance program. It is not intended to diagnose MRSA infection nor to guide or monitor treatment for MRSA infections. Performed at [facility], [address] [place], [place] [address]     Coagulation Studies: No results for input(s): LABPROT, INR in the last 72 hours.  Urinalysis: No results for input(s): COLORURINE, LABSPEC, [place], [place],  HGBUR, BILIRUBINUR, KETONESUR, PROTEINUR, UROBILINOGEN, NITRITE, LEUKOCYTESUR in the last 72 hours.  Invalid input(s): APPERANCEUR    Imaging: Dg Lumbar Spine 2-3 Views  Result Date: 04/21/2018 CLINICAL DATA:  Acute lower back pain after fall 2 weeks ago. EXAM: LUMBAR SPINE - 2-3 VIEW COMPARISON:  None. FINDINGS: No fracture or spondylolisthesis is noted. Moderate degenerative disc disease is noted at L2-3 and L3-4 with anterior osteophyte formation. Large anterior osteophyte formation is noted at L4-5. Atherosclerosis of abdominal aorta is noted. IMPRESSION: Moderate multilevel degenerative disc disease. No acute abnormality seen in the lumbar spine. Aortic Atherosclerosis (ICD10-I70.0). Electronically Signed   By: Marijo Conception, M.D.   On: 04/21/2018 13:38     Medications:   . small volume/piggyback builder     .  sodium chloride   Intravenous Once  . amiodarone  200 mg Oral Daily  . Chlorhexidine Gluconate Cloth  6 each Topical Q0600  . cinacalcet  60 mg Oral Daily  . feeding supplement (NEPRO CARB STEADY)  237 mL Oral BID BM  . lidocaine  1 patch Transdermal Q24H  . multivitamin  1 tablet Oral QHS  . pantoprazole  40 mg Oral BID   acetaminophen **OR** acetaminophen, diphenhydrAMINE, hydrOXYzine, ipratropium-albuterol, ondansetron **OR** ondansetron (ZOFRAN) IV, traMADol  Assessment/ Plan:  63 y.o. male withend stage renal disease on hemodialysis, hypertension, hepatitis C, anemia, seizure disorder, COPD/tobacco abuse,history of substance abuse,secondary hyperparathyroidism  [facility] MWF [address]Left arm AVG.  240 minutes  1.  ESRD on HD MWF.  atient due for dialysis today.  Orders have been prepared.  2.  Anemia of chronic kidney disease/GI bleed.  Duodenal ulcer was noted on EGD.  Patient maintained on PPI.  Hemoglobin relatively stable at 9.1.  3.  Secondary hyperparathyroidism.  Repeat serum phosphorus today.   LOS: 3 Narada Uzzle 2/28/20203:00 PM

## 2018-04-21 NOTE — Progress Notes (Signed)
Post HD Tx Assessment 2041mL net fluid removal during HD tx, tolerated tx well, pt returning to room for discharge.     04/21/18 1957  Neurological  Level of Consciousness Alert  Orientation Level Oriented X4  Respiratory  Respiratory Pattern Regular  Chest Assessment Chest expansion symmetrical  Bilateral Breath Sounds Clear;Diminished  Cardiac  Pulse Regular  ECG Monitor Yes  Vascular  R Radial Pulse +2  L Radial Pulse +2  Integumentary  Integumentary (WDL) X  Skin Color Appropriate for ethnicity  Skin Condition Dry  Musculoskeletal  Musculoskeletal (WDL) X  Generalized Weakness Yes  Psychosocial  Psychosocial (WDL) WDL

## 2018-04-22 LAB — H. PYLORI ANTIGEN, STOOL: H. Pylori Stool Ag, Eia: POSITIVE — AB

## 2018-04-24 ENCOUNTER — Telehealth: Payer: Self-pay

## 2018-04-24 NOTE — Telephone Encounter (Signed)
-----   Message from Jonathon Bellows, MD sent at 04/24/2018  8:36 AM EST ----- H pylori positive .   Suggest clarithromycin 500 mg PO BID, amoxicillin 1 gram TID, omeprazole 20 mg BID all for 14 days.  , check for penicillin allergy and will need repeat H pylori stool antigen to check for eradication after .

## 2018-04-24 NOTE — Anesthesia Postprocedure Evaluation (Signed)
Anesthesia Post Note  Patient: [name]  Procedure(s) Performed: ESOPHAGOGASTRODUODENOSCOPY (EGD) WITH PROPOFOL (N/A )  Patient location during evaluation: PACU Anesthesia Type: General Level of consciousness: awake and alert and oriented Pain management: pain level controlled Vital Signs Assessment: post-procedure vital signs reviewed and stable Respiratory status: spontaneous breathing Cardiovascular status: blood pressure returned to baseline Anesthetic complications: no     Last Vitals:  Vitals:   04/21/18 1954 04/21/18 2005  BP: (!) 104/55 114/60  Pulse: 66 77  Resp: 16 18  Temp: 36.9 C (!) 36.3 C  SpO2: 100% 98%    Last Pain:  Vitals:   04/21/18 2005  TempSrc: Oral  PainSc:                  Cortavious Nix

## 2018-04-24 NOTE — Telephone Encounter (Signed)
Called pt to inform him of lab results and Dr. Georgeann Oppenheim instructions for pt to commence on medication for treatment of H pylori.  Unable to contact, No VM setup

## 2018-04-25 ENCOUNTER — Ambulatory Visit: Payer: Medicare Other | Admitting: Nurse Practitioner

## 2018-04-26 ENCOUNTER — Encounter: Payer: Self-pay | Admitting: Gastroenterology

## 2018-04-26 ENCOUNTER — Ambulatory Visit: Payer: Medicare Other | Admitting: Gastroenterology

## 2018-04-26 ENCOUNTER — Encounter: Payer: Self-pay | Admitting: Cardiovascular Disease

## 2018-05-02 NOTE — Telephone Encounter (Signed)
-----   Message from Jonathon Bellows, MD sent at 04/24/2018  8:36 AM EST ----- H pylori positive .   Suggest clarithromycin 500 mg PO BID, amoxicillin 1 gram TID, omeprazole 20 mg BID all for 14 days.  , check for penicillin allergy and will need repeat H pylori stool antigen to check for eradication after .

## 2018-05-02 NOTE — Telephone Encounter (Signed)
Called pt to inform him of lab results and Dr. Georgeann Oppenheim instructions for pt to commence on medication for treatment of H pylori.  Unable to contact via phone. No VM available. Will send letter.

## 2018-05-03 ENCOUNTER — Other Ambulatory Visit: Payer: Self-pay

## 2018-05-03 ENCOUNTER — Inpatient Hospital Stay: Payer: Medicare Other

## 2018-05-03 ENCOUNTER — Inpatient Hospital Stay
Admission: EM | Admit: 2018-05-03 | Discharge: 2018-05-10 | DRG: 356 | Disposition: A | Discharge status: Other Acute Inpatient Hospital | Payer: Medicare Other | Attending: Internal Medicine | Admitting: Internal Medicine

## 2018-05-03 DIAGNOSIS — F10239 Alcohol dependence with withdrawal, unspecified: Secondary | ICD-10-CM | POA: Diagnosis not present

## 2018-05-03 DIAGNOSIS — J449 Chronic obstructive pulmonary disease, unspecified: Secondary | ICD-10-CM | POA: Diagnosis present

## 2018-05-03 DIAGNOSIS — I248 Other forms of acute ischemic heart disease: Secondary | ICD-10-CM | POA: Diagnosis not present

## 2018-05-03 DIAGNOSIS — I255 Ischemic cardiomyopathy: Secondary | ICD-10-CM | POA: Diagnosis present

## 2018-05-03 DIAGNOSIS — E785 Hyperlipidemia, unspecified: Secondary | ICD-10-CM | POA: Diagnosis present

## 2018-05-03 DIAGNOSIS — E1151 Type 2 diabetes mellitus with diabetic peripheral angiopathy without gangrene: Secondary | ICD-10-CM | POA: Diagnosis present

## 2018-05-03 DIAGNOSIS — Z8249 Family history of ischemic heart disease and other diseases of the circulatory system: Secondary | ICD-10-CM

## 2018-05-03 DIAGNOSIS — M1990 Unspecified osteoarthritis, unspecified site: Secondary | ICD-10-CM | POA: Diagnosis present

## 2018-05-03 DIAGNOSIS — J690 Pneumonitis due to inhalation of food and vomit: Secondary | ICD-10-CM | POA: Diagnosis not present

## 2018-05-03 DIAGNOSIS — I5022 Chronic systolic (congestive) heart failure: Secondary | ICD-10-CM | POA: Diagnosis not present

## 2018-05-03 DIAGNOSIS — G9341 Metabolic encephalopathy: Secondary | ICD-10-CM | POA: Diagnosis not present

## 2018-05-03 DIAGNOSIS — Z781 Physical restraint status: Secondary | ICD-10-CM

## 2018-05-03 DIAGNOSIS — R578 Other shock: Secondary | ICD-10-CM | POA: Diagnosis not present

## 2018-05-03 DIAGNOSIS — R9431 Abnormal electrocardiogram [ECG] [EKG]: Secondary | ICD-10-CM | POA: Diagnosis not present

## 2018-05-03 DIAGNOSIS — K264 Chronic or unspecified duodenal ulcer with hemorrhage: Principal | ICD-10-CM | POA: Diagnosis present

## 2018-05-03 DIAGNOSIS — R079 Chest pain, unspecified: Secondary | ICD-10-CM

## 2018-05-03 DIAGNOSIS — J9601 Acute respiratory failure with hypoxia: Secondary | ICD-10-CM | POA: Diagnosis not present

## 2018-05-03 DIAGNOSIS — K625 Hemorrhage of anus and rectum: Secondary | ICD-10-CM | POA: Diagnosis present

## 2018-05-03 DIAGNOSIS — I272 Pulmonary hypertension, unspecified: Secondary | ICD-10-CM | POA: Diagnosis present

## 2018-05-03 DIAGNOSIS — D649 Anemia, unspecified: Secondary | ICD-10-CM

## 2018-05-03 DIAGNOSIS — E1122 Type 2 diabetes mellitus with diabetic chronic kidney disease: Secondary | ICD-10-CM | POA: Diagnosis present

## 2018-05-03 DIAGNOSIS — Z992 Dependence on renal dialysis: Secondary | ICD-10-CM

## 2018-05-03 DIAGNOSIS — B1920 Unspecified viral hepatitis C without hepatic coma: Secondary | ICD-10-CM | POA: Diagnosis present

## 2018-05-03 DIAGNOSIS — I2510 Atherosclerotic heart disease of native coronary artery without angina pectoris: Secondary | ICD-10-CM | POA: Diagnosis present

## 2018-05-03 DIAGNOSIS — N2581 Secondary hyperparathyroidism of renal origin: Secondary | ICD-10-CM | POA: Diagnosis present

## 2018-05-03 DIAGNOSIS — Z7982 Long term (current) use of aspirin: Secondary | ICD-10-CM

## 2018-05-03 DIAGNOSIS — I5042 Chronic combined systolic (congestive) and diastolic (congestive) heart failure: Secondary | ICD-10-CM | POA: Diagnosis present

## 2018-05-03 DIAGNOSIS — D631 Anemia in chronic kidney disease: Secondary | ICD-10-CM | POA: Diagnosis present

## 2018-05-03 DIAGNOSIS — I9589 Other hypotension: Secondary | ICD-10-CM | POA: Diagnosis not present

## 2018-05-03 DIAGNOSIS — Z87891 Personal history of nicotine dependence: Secondary | ICD-10-CM

## 2018-05-03 DIAGNOSIS — R05 Cough: Secondary | ICD-10-CM

## 2018-05-03 DIAGNOSIS — G9340 Encephalopathy, unspecified: Secondary | ICD-10-CM | POA: Diagnosis not present

## 2018-05-03 DIAGNOSIS — J9690 Respiratory failure, unspecified, unspecified whether with hypoxia or hypercapnia: Secondary | ICD-10-CM

## 2018-05-03 DIAGNOSIS — Z9981 Dependence on supplemental oxygen: Secondary | ICD-10-CM

## 2018-05-03 DIAGNOSIS — R571 Hypovolemic shock: Secondary | ICD-10-CM | POA: Diagnosis present

## 2018-05-03 DIAGNOSIS — I428 Other cardiomyopathies: Secondary | ICD-10-CM | POA: Diagnosis present

## 2018-05-03 DIAGNOSIS — E871 Hypo-osmolality and hyponatremia: Secondary | ICD-10-CM | POA: Diagnosis not present

## 2018-05-03 DIAGNOSIS — K317 Polyp of stomach and duodenum: Secondary | ICD-10-CM | POA: Diagnosis present

## 2018-05-03 DIAGNOSIS — E875 Hyperkalemia: Secondary | ICD-10-CM | POA: Diagnosis not present

## 2018-05-03 DIAGNOSIS — Z833 Family history of diabetes mellitus: Secondary | ICD-10-CM

## 2018-05-03 DIAGNOSIS — G40909 Epilepsy, unspecified, not intractable, without status epilepticus: Secondary | ICD-10-CM | POA: Diagnosis present

## 2018-05-03 DIAGNOSIS — J9600 Acute respiratory failure, unspecified whether with hypoxia or hypercapnia: Secondary | ICD-10-CM

## 2018-05-03 DIAGNOSIS — Z4659 Encounter for fitting and adjustment of other gastrointestinal appliance and device: Secondary | ICD-10-CM

## 2018-05-03 DIAGNOSIS — I252 Old myocardial infarction: Secondary | ICD-10-CM

## 2018-05-03 DIAGNOSIS — D62 Acute posthemorrhagic anemia: Secondary | ICD-10-CM | POA: Diagnosis present

## 2018-05-03 DIAGNOSIS — K920 Hematemesis: Secondary | ICD-10-CM

## 2018-05-03 DIAGNOSIS — K922 Gastrointestinal hemorrhage, unspecified: Secondary | ICD-10-CM | POA: Diagnosis not present

## 2018-05-03 DIAGNOSIS — B9681 Helicobacter pylori [H. pylori] as the cause of diseases classified elsewhere: Secondary | ICD-10-CM | POA: Diagnosis present

## 2018-05-03 DIAGNOSIS — Z9911 Dependence on respirator [ventilator] status: Secondary | ICD-10-CM | POA: Diagnosis not present

## 2018-05-03 DIAGNOSIS — I132 Hypertensive heart and chronic kidney disease with heart failure and with stage 5 chronic kidney disease, or end stage renal disease: Secondary | ICD-10-CM | POA: Diagnosis present

## 2018-05-03 DIAGNOSIS — D72829 Elevated white blood cell count, unspecified: Secondary | ICD-10-CM

## 2018-05-03 DIAGNOSIS — I959 Hypotension, unspecified: Secondary | ICD-10-CM | POA: Diagnosis not present

## 2018-05-03 DIAGNOSIS — E861 Hypovolemia: Secondary | ICD-10-CM | POA: Diagnosis not present

## 2018-05-03 DIAGNOSIS — N186 End stage renal disease: Secondary | ICD-10-CM | POA: Diagnosis present

## 2018-05-03 DIAGNOSIS — R059 Cough, unspecified: Secondary | ICD-10-CM

## 2018-05-03 DIAGNOSIS — D500 Iron deficiency anemia secondary to blood loss (chronic): Secondary | ICD-10-CM | POA: Diagnosis not present

## 2018-05-03 DIAGNOSIS — I340 Nonrheumatic mitral (valve) insufficiency: Secondary | ICD-10-CM | POA: Diagnosis present

## 2018-05-03 DIAGNOSIS — Z882 Allergy status to sulfonamides status: Secondary | ICD-10-CM | POA: Diagnosis not present

## 2018-05-03 DIAGNOSIS — Z720 Tobacco use: Secondary | ICD-10-CM | POA: Diagnosis not present

## 2018-05-03 DIAGNOSIS — F1910 Other psychoactive substance abuse, uncomplicated: Secondary | ICD-10-CM | POA: Diagnosis not present

## 2018-05-03 LAB — CBC WITH DIFFERENTIAL/PLATELET
Abs Immature Granulocytes: 0.13 10*3/uL — ABNORMAL HIGH (ref 0.00–0.07)
Basophils Absolute: 0 10*3/uL (ref 0.0–0.1)
Basophils Relative: 0 %
Eosinophils Absolute: 0.1 10*3/uL (ref 0.0–0.5)
Eosinophils Relative: 1 %
HCT: 18.5 % — ABNORMAL LOW (ref 39.0–52.0)
HEMOGLOBIN: 5.5 g/dL — AB (ref 13.0–17.0)
Immature Granulocytes: 2 %
Lymphocytes Relative: 13 %
Lymphs Abs: 1.1 10*3/uL (ref 0.7–4.0)
MCH: 29.7 pg (ref 26.0–34.0)
MCHC: 29.7 g/dL — ABNORMAL LOW (ref 30.0–36.0)
MCV: 100 fL (ref 80.0–100.0)
Monocytes Absolute: 1.3 10*3/uL — ABNORMAL HIGH (ref 0.1–1.0)
Monocytes Relative: 15 %
Neutro Abs: 6.1 10*3/uL (ref 1.7–7.7)
Neutrophils Relative %: 69 %
Platelets: 183 10*3/uL (ref 150–400)
RBC: 1.85 MIL/uL — ABNORMAL LOW (ref 4.22–5.81)
RDW: 17.7 % — ABNORMAL HIGH (ref 11.5–15.5)
WBC: 8.8 10*3/uL (ref 4.0–10.5)
nRBC: 0.7 % — ABNORMAL HIGH (ref 0.0–0.2)

## 2018-05-03 LAB — BLOOD GAS, ARTERIAL
Acid-base deficit: 5.3 mmol/L — ABNORMAL HIGH (ref 0.0–2.0)
Bicarbonate: 21.2 mmol/L (ref 20.0–28.0)
FIO2: 0.4
MECHVT: 450 mL
O2 Saturation: 96.7 %
PEEP: 5 cmH2O
Patient temperature: 37
RATE: 15 resp/min
pCO2 arterial: 44 mmHg (ref 32.0–48.0)
pH, Arterial: 7.29 — ABNORMAL LOW (ref 7.350–7.450)
pO2, Arterial: 97 mmHg (ref 83.0–108.0)

## 2018-05-03 LAB — COMPREHENSIVE METABOLIC PANEL
ALT: 14 U/L (ref 0–44)
AST: 20 U/L (ref 15–41)
Albumin: 1.9 g/dL — ABNORMAL LOW (ref 3.5–5.0)
Alkaline Phosphatase: 61 U/L (ref 38–126)
Anion gap: 6 (ref 5–15)
BUN: 38 mg/dL — ABNORMAL HIGH (ref 8–23)
CALCIUM: 6.8 mg/dL — AB (ref 8.9–10.3)
CO2: 23 mmol/L (ref 22–32)
Chloride: 113 mmol/L — ABNORMAL HIGH (ref 98–111)
Creatinine, Ser: 3.49 mg/dL — ABNORMAL HIGH (ref 0.61–1.24)
GFR calc non Af Amer: 17 mL/min — ABNORMAL LOW (ref >60)
GFR, EST AFRICAN AMERICAN: 20 mL/min — AB (ref >60)
Glucose, Bld: 98 mg/dL (ref 70–99)
Potassium: 4.7 mmol/L (ref 3.5–5.1)
SODIUM: 142 mmol/L (ref 135–145)
Total Bilirubin: 0.6 mg/dL (ref 0.3–1.2)
Total Protein: 4.4 g/dL — ABNORMAL LOW (ref 6.5–8.1)

## 2018-05-03 LAB — PREPARE RBC (CROSSMATCH)

## 2018-05-03 LAB — HEMOGLOBIN AND HEMATOCRIT, BLOOD
HCT: 26.1 % — ABNORMAL LOW (ref 39.0–52.0)
Hemoglobin: 8 g/dL — ABNORMAL LOW (ref 13.0–17.0)

## 2018-05-03 LAB — MRSA PCR SCREENING: MRSA by PCR: NEGATIVE

## 2018-05-03 MED ORDER — POLYETHYLENE GLYCOL 3350 17 G PO PACK: 17.0000 g | PACK | Freq: Every day | ORAL | Status: DC | PRN

## 2018-05-03 MED ORDER — MIDAZOLAM HCL 2 MG/2ML IJ SOLN: 2.0000 mg | INTRAMUSCULAR | Status: DC | PRN

## 2018-05-03 MED ORDER — SODIUM CHLORIDE 0.9 % IV SOLN
8.0000 mg/h | INTRAVENOUS | Status: DC
  Administered 2018-05-03 – 2018-05-05 (×4): 8 mg/h via INTRAVENOUS
  Filled 2018-05-03 (×3): qty 80

## 2018-05-03 MED ORDER — NOREPINEPHRINE 4 MG/250ML-% IV SOLN
0.0000 ug/min | INTRAVENOUS | Status: DC
  Filled 2018-05-03: qty 250

## 2018-05-03 MED ORDER — ETOMIDATE 2 MG/ML IV SOLN: 25.0000 mg | Freq: Once | INTRAVENOUS | Status: DC

## 2018-05-03 MED ORDER — IPRATROPIUM-ALBUTEROL 0.5-2.5 (3) MG/3ML IN SOLN
3.0000 mL | RESPIRATORY_TRACT | Status: DC | PRN
  Filled 2018-05-03: qty 3

## 2018-05-03 MED ORDER — TRANEXAMIC ACID-NACL 1000-0.7 MG/100ML-% IV SOLN
1000.0000 mg | INTRAVENOUS | Status: DC
  Filled 2018-05-03: qty 100

## 2018-05-03 MED ORDER — IPRATROPIUM-ALBUTEROL 0.5-2.5 (3) MG/3ML IN SOLN
3.0000 mL | RESPIRATORY_TRACT | Status: DC
  Administered 2018-05-03 – 2018-05-04 (×5): 3 mL via RESPIRATORY_TRACT
  Filled 2018-05-03 (×4): qty 3

## 2018-05-03 MED ORDER — SUCCINYLCHOLINE CHLORIDE 20 MG/ML IJ SOLN
100.0000 mg | Freq: Once | INTRAMUSCULAR | Status: AC
  Administered 2018-05-03: 100 mg via INTRAVENOUS

## 2018-05-03 MED ORDER — NOREPINEPHRINE BITARTRATE 1 MG/ML IV SOLN
0.0000 ug/min | INTRAVENOUS | Status: DC
  Administered 2018-05-03: 4 ug/min via INTRAVENOUS
  Filled 2018-05-03: qty 4

## 2018-05-03 MED ORDER — LEVETIRACETAM IN NACL 500 MG/100ML IV SOLN
500.0000 mg | Freq: Two times a day (BID) | INTRAVENOUS | Status: DC
  Administered 2018-05-03 – 2018-05-04 (×2): 500 mg via INTRAVENOUS
  Filled 2018-05-03 (×3): qty 100

## 2018-05-03 MED ORDER — ACETAMINOPHEN 325 MG PO TABS: 650.0000 mg | ORAL_TABLET | Freq: Four times a day (QID) | ORAL | Status: DC | PRN

## 2018-05-03 MED ORDER — SODIUM CHLORIDE 0.9 % IV BOLUS
250.0000 mL | Freq: Once | INTRAVENOUS | Status: AC
  Administered 2018-05-03: 250 mL via INTRAVENOUS

## 2018-05-03 MED ORDER — FENTANYL CITRATE (PF) 100 MCG/2ML IJ SOLN
100.0000 ug | INTRAMUSCULAR | Status: DC | PRN
  Administered 2018-05-04: 100 ug via INTRAVENOUS
  Filled 2018-05-03: qty 2

## 2018-05-03 MED ORDER — ONDANSETRON HCL 4 MG/2ML IJ SOLN
4.0000 mg | Freq: Four times a day (QID) | INTRAMUSCULAR | Status: DC | PRN
  Filled 2018-05-03: qty 2

## 2018-05-03 MED ORDER — SODIUM CHLORIDE 0.9 % IV SOLN
80.0000 mg | Freq: Once | INTRAVENOUS | Status: AC
  Administered 2018-05-03: 80 mg via INTRAVENOUS
  Filled 2018-05-03: qty 80

## 2018-05-03 MED ORDER — PROPOFOL 1000 MG/100ML IV EMUL
0.0000 ug/kg/min | INTRAVENOUS | Status: DC
  Administered 2018-05-03: 5 ug/kg/min via INTRAVENOUS
  Administered 2018-05-04 (×4): 50 ug/kg/min via INTRAVENOUS
  Administered 2018-05-04: 60 ug/kg/min via INTRAVENOUS
  Administered 2018-05-05 (×2): 50 ug/kg/min via INTRAVENOUS
  Filled 2018-05-03 (×10): qty 100

## 2018-05-03 MED ORDER — FENTANYL CITRATE (PF) 100 MCG/2ML IJ SOLN
100.0000 ug | INTRAMUSCULAR | Status: DC | PRN
  Administered 2018-05-03 (×2): 100 ug via INTRAVENOUS
  Filled 2018-05-03 (×3): qty 2

## 2018-05-03 MED ORDER — SODIUM CHLORIDE 0.9% IV SOLUTION: Freq: Once | INTRAVENOUS | Status: DC

## 2018-05-03 MED ORDER — ACETAMINOPHEN 650 MG RE SUPP: 650.0000 mg | Freq: Four times a day (QID) | RECTAL | Status: DC | PRN

## 2018-05-03 MED ORDER — MIDAZOLAM HCL 2 MG/2ML IJ SOLN
2.0000 mg | INTRAMUSCULAR | Status: DC | PRN
  Administered 2018-05-03 – 2018-05-05 (×6): 2 mg via INTRAVENOUS
  Filled 2018-05-03 (×6): qty 2

## 2018-05-03 MED ORDER — MIDAZOLAM HCL 2 MG/2ML IJ SOLN
INTRAMUSCULAR | Status: AC
  Filled 2018-05-03: qty 2

## 2018-05-03 MED ORDER — ONDANSETRON HCL 4 MG PO TABS: 4.0000 mg | ORAL_TABLET | Freq: Four times a day (QID) | ORAL | Status: DC | PRN

## 2018-05-03 MED ORDER — SODIUM CHLORIDE 0.9 % IV SOLN
50.0000 ug/h | INTRAVENOUS | Status: DC
  Filled 2018-05-03 (×3): qty 1

## 2018-05-03 NOTE — ED Notes (Signed)
Assumed care of patient coming from dialysis with c/o of rectal bleeding and vomiting bloos. Patient pale for ethnicity upon arrival alert and awake able to answer questions appropriately.  C/o of having to have a bowel movement. Bed pan provided. Patient had  A large amount of dark bloody loose stools. Patient arrived hypotensive in the 86's. Began to loose conscious awakened with with firm stimuli. Md called to bedside. Patient c/o of having to vomit. Patient vomit copious amount of clots and and bright red blood. RSI obtained for intubation and maintenance of airway.25mg  etomedate and 100mg  of succs used for sedation and intubation. Patient  Intubated, RT to assist. Tube size 7 24 at the lip. OJ placed to wall suction. 315ml of dark bloody liquid noted.  Emergency bloods obtained transfused through level 1 . 2 liters of normal saline. IV 18 g to right arm infiltrated. Md at bedside to u/s guide to obtain access. #18g to right upper arm placed.

## 2018-05-03 NOTE — Progress Notes (Addendum)
Brief GI note, full consult note to follow  Called by ER attending around 6:30pm due to hematemesis and hematochezia, hypotensive and AMS. Underwent HD today. He was intubated in ER for airway protection due to hematemesis and AMS, started on PPI drip and received 4 units of PRBCs and admitted to ICU. Hb was 5.5 prior to resuscitation, dropped from 9.1 since last admission.   In Brief, pt with ESRD on HD, CHF EF 35% admitted to [facility] 2 weeks ago due to upper gi bleed, egd by Dr Vicente Males on 2/27, revealed clean based 1cm ulcer in the duodenal bulb and H Pylori Ag positive.    His current Hb is 8, borderline hypotensive, had another episode of dark red BM in ICU. I spoke to ICU NP, recommended another unit of PRBCs, stat CT angio due to concern for ongoing GI bleed and hypotension. If CT angio is positive, consult vascular surgery right away for embolization. In such setting, EGD is recommended within 12 hours if pt's hemodynamics are stable. Otherwise, either embolization or surgery are the next steps for actively bleeding peptic ulcer with hemodynamic instability. Continue PPI drip  Patient is critically ill given his medical comorbidities   Cephas Darby, MD [address]  [place]  [place], [place] [address]  Main: [phone]  Fax: [phone] Pager: [phone]

## 2018-05-03 NOTE — ED Triage Notes (Signed)
pt to ED for a rectal bleed. Per EMS pt lost 250ccs in the toilet and while walking throught the house. Pt was seen in ED for same complaint two weeks ago. Pt is currently a dialysis pt with theleft arm off limit. PT recieved dialysis today. Upon arrival pt is combative and altered. BP is 68/45 upon arrival to ED.

## 2018-05-03 NOTE — Procedures (Signed)
Central Venous Catheter Insertion Procedure Note Karl Erway [id number] Dec 30, 1955  Procedure: Insertion of Central Venous Catheter Indications: Assessment of intravascular volume, Drug and/or fluid administration and Frequent blood sampling  Procedure Details Consent: Unable to obtain consent because of emergent medical necessity. Time Out: Verified patient identification, verified procedure, site/side was marked, verified correct patient position, special equipment/implants available, medications/allergies/relevent history reviewed, required imaging and test results available.  Performed  Maximum sterile technique was used including antiseptics, cap, gloves, gown, hand hygiene, mask and sheet. Skin prep: Chlorhexidine; local anesthetic administered A antimicrobial bonded/coated triple lumen catheter was placed in the left femoral vein due to patient being a dialysis patient using the Seldinger technique.  Evaluation Blood flow good Complications: No apparent complications Patient did tolerate procedure well. Chest X-ray ordered to verify placement.  CXR: Not indicated, placed in left femoral vein.  Procedure was performed using Ultrasound for direct visualization of cannulization of Left Femoral Vein.   Darel Hong, AGACNP-BC [facility] Pager: [phone] Cell: [phone]   Bradly Bienenstock 05/03/2018, 10:49 PM

## 2018-05-03 NOTE — ED Notes (Signed)
Propofol drip started at 70mcg/kg/hr.

## 2018-05-03 NOTE — Progress Notes (Signed)
Pt transported to ICU 16 on vent without incident. Pt remains on vent and is tol well at this time. Report given to ICU RT.

## 2018-05-03 NOTE — ED Provider Notes (Signed)
[facility] Emergency Department Provider Note  Time seen: 6:31 PM  I have reviewed the triage vital signs and the nursing notes.   HISTORY  Chief Complaint Rectal Bleeding    HPI [name] is a 63 y.o. male with a past medical history of CAD, CHF, diabetes, ESRD on hemodialysis Monday/Wednesday/Friday, hypertension, prior MI, pulmonary hypertension, presents to the emergency department with acute GI bleed.  According to EMS patient received his full dialysis session shortly after returning home began having dark bowel movements that then became bright red blood.  Patient called EMS.  EMS states upon arrival patient is hypotensive, 75 systolic lightheaded and dizzy.  I started IV fluids brought the patient to the emergency department.  Upon arrival patient had another very large bowel movement greater than 500 cc of bright red blood, very lightheaded and dizzy feeling with a blood pressure of 65 systolic.    Past Medical History:  Diagnosis Date  . Arthritis   . CAD (coronary artery disease)    a. 09/2013 Myoview [facility]): basal inf defect more pronounced @ rest, likely artifact, EF 48%, low risk study; b. 06/2016 Cath [facility]): LAD 10p, 30-37m, 40-50d, D1 90(small), D2 40, D3 60-70, D4 30, LCX 10p, 27m, 20d, Om1 30, OM3 40, RCA 100p w/ bridging collats, m/d RCA fill via collats, small/mod caliber-->Med Rx; c. 02/2017 MV: inf infarct w/ peri-inf ischemia. EF <30%.   . Chronic combined systolic and diastolic CHF (congestive heart failure) ([place])    a. 10/2013 Echo [facility]): EF 50%, diast dysfxn;  b. 10/2014 Echo: EF 30-35%, diff HK, gr1 DD;  c. 04/2016 Echo: EF 20-25%, sev dil LV, diff HK, gr3 DD; d. 06/2016 Echo: EF 30-35%, Gr2 DD.  . Diabetes ([place])   . Dyspnea   . ESRD (end stage renal disease) ([place])    a. MWF dialysis @ [facility].  . Hepatitis    Patient is unsure type of Hepatitis he has  . Hypertension   . Hypertensive heart disease with CHF (congestive heart  failure) ([place])   . Mixed Ischemic and non-ischemic cardiomyopathy    a. 10/2013 Echo [facility]): EF 50%, diast dysfxn;  b. 10/2014 Echo: EF 30-35%, diff HK, gr1 DD;  c. 04/2016 Echo: EF 20-25%, sev dil LV, diff HK, gr3 DD; d. 06/2016 Echo: EF 30-35%, Gr2 DD.  [name] Moderate Aortic insufficiency    a. 04/2016 Echo: Mod AI.  [name] Myocardial infarction ([place])   . Peripheral vascular disease ([place])   . Pulmonary hypertension ([place])    a. 10/2014 Echo: Mod-Sev PAH.  [name] PVC's (premature ventricular contractions)    a. 02/2017 24 hr holter: freq polymorphic PVCs w/ total of 38K beats in 24 hrs (34% burden).    Patient Active Problem List   Diagnosis Date Noted  . GI bleed 04/18/2018  . Tachycardia 03/13/2017  . Acute respiratory failure with hypoxia ([place]) 02/15/2017  . Chronic systolic heart failure ([place]) 09/08/2016  . 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 Hyperkalemia 07/04/2016  . Abnormal EKG 07/04/2016  . Elevated troponin   . Chest pain 05/22/2016  . Sepsis ([place]) 02/26/2016  . HCAP (healthcare-associated pneumonia) 02/26/2016  . Diabetes ([place]) 02/26/2016  . Community acquired pneumonia 01/07/2016  . Renal dialysis device, implant, or graft complication 19/41/7408  . Tobacco use 09/04/2015  . ESRD (end stage renal disease) on dialysis ([place]) 08/02/2015  . HTN (hypertension) 08/02/2015  . Pulmonary edema 12/02/2014    Past Surgical History:  Procedure Laterality Date  .  A/V FISTULAGRAM Left 04/06/2016   Procedure: A/V Fistulagram;  Surgeon: Katha Cabal, MD;  Location: [place] [facility];  Service: Cardiovascular;  Laterality: Left;  . A/V FISTULAGRAM Left 01/19/2017   Procedure: A/V FISTULAGRAM;  Surgeon: Katha Cabal, MD;  Location: [place] CV [facility];  Service: Cardiovascular;  Laterality: Left;  . A/V SHUNT INTERVENTION N/A 04/06/2016   Procedure: A/V Shunt Intervention;  Surgeon: Katha Cabal, MD;  Location: [place] [facility];  Service: Cardiovascular;  Laterality: N/A;  . A/V  SHUNT INTERVENTION N/A 06/29/2016   Procedure: A/V Shunt Intervention;  Surgeon: Katha Cabal, MD;  Location: [place] [facility];  Service: Cardiovascular;  Laterality: N/A;  . AV FISTULA PLACEMENT Left 2014  . AV FISTULA PLACEMENT Left 06/01/2017   Procedure: INSERTION OF ARTERIOVENOUS (AV) GORE-TEX GRAFT ARM ( BRACHIAL AXILLARY );  Surgeon: Katha Cabal, MD;  Location: [facility] ORS;  Service: Vascular;  Laterality: Left;  . DIALYSIS/PERMA CATHETER INSERTION Right    Dr. Delana Meyer  . DIALYSIS/PERMA CATHETER REMOVAL N/A 07/05/2017   Procedure: DIALYSIS/PERMA CATHETER REMOVAL;  Surgeon: Katha Cabal, MD;  Location: [place] [facility];  Service: Cardiovascular;  Laterality: N/A;  . ESOPHAGOGASTRODUODENOSCOPY (EGD) WITH PROPOFOL N/A 04/20/2018   Procedure: ESOPHAGOGASTRODUODENOSCOPY (EGD) WITH PROPOFOL;  Surgeon: Jonathon Bellows, MD;  Location: [facility] ENDOSCOPY;  Service: Gastroenterology;  Laterality: N/A;  . PERIPHERAL VASCULAR CATHETERIZATION N/A 10/03/2014   Procedure: A/V Shuntogram/Fistulagram;  Surgeon: Algernon Huxley, MD;  Location: [place] [facility];  Service: Cardiovascular;  Laterality: N/A;  . PERIPHERAL VASCULAR CATHETERIZATION Left 10/03/2014   Procedure: A/V Shunt Intervention;  Surgeon: Algernon Huxley, MD;  Location: [place] [facility];  Service: Cardiovascular;  Laterality: Left;  . PERIPHERAL VASCULAR CATHETERIZATION Left 05/01/2015   Procedure: A/V Shuntogram/Fistulagram;  Surgeon: Algernon Huxley, MD;  Location: [place] [facility];  Service: Cardiovascular;  Laterality: Left;  . PERIPHERAL VASCULAR CATHETERIZATION N/A 05/01/2015   Procedure: A/V Shunt Intervention;  Surgeon: Algernon Huxley, MD;  Location: [place] [facility];  Service: Cardiovascular;  Laterality: N/A;  . UPPER EXTREMITY ANGIOGRAPHY Bilateral 06/29/2016   Procedure: Upper Extremity Angiography;  Surgeon: Katha Cabal, MD;  Location: [place] [facility];  Service: Cardiovascular;  Laterality: Bilateral;  . UPPER  EXTREMITY INTERVENTION  06/29/2016   Procedure: Upper Extremity Intervention;  Surgeon: Katha Cabal, MD;  Location: [place] [facility];  Service: Cardiovascular;;  . UPPER EXTREMITY VENOGRAPHY Left 03/15/2017   Procedure: UPPER EXTREMITY VENOGRAPHY;  Surgeon: Katha Cabal, MD;  Location: [place] [facility];  Service: Cardiovascular;  Laterality: Left;    Prior to Admission medications   Medication Sig Start Date End Date Taking? Authorizing Provider  acetaminophen (TYLENOL) 325 MG tablet Take 2 tablets (650 mg total) by mouth every 6 (six) hours as needed for mild pain (or Fever >/= 101). Patient taking differently: Take 500 mg by mouth every 8 (eight) hours as needed for mild pain (or Fever >/= 101).  09/21/16   Gouru, Aruna, MD  albuterol (PROVENTIL HFA;VENTOLIN HFA) 108 (90 Base) MCG/ACT inhaler Inhale 2 puffs into the lungs every 4 (four) hours as needed for wheezing or shortness of breath.     [provider]  amiodarone (PACERONE) 200 MG tablet Take 1 tablet (200 mg total) by mouth daily. 04/06/17   Theora Gianotti, NP  aspirin EC 81 MG tablet Take 1 tablet (81 mg total) by mouth daily. 04/21/18 04/21/19  Nicholes Mango, MD  atorvastatin (LIPITOR) 40 MG tablet Take 1 tablet (40 mg total) by mouth daily. Patient taking differently: Take 40 mg by mouth every evening.  02/19/17   Demetrios Loll, MD  calcium acetate (PHOSLO) 667 MG capsule Take 1,334-2,001 mg by mouth 3 (three) times daily with meals. take 3 capsules (2001mg ) by mouth three times daily before each meal, and 2 capsules (667mg ) by mouth with snacks twice daily 12/11/15   [provider]  cinacalcet (SENSIPAR) 90 MG tablet Take 60 mg by mouth daily.     [provider]  cyclobenzaprine (FLEXERIL) 5 MG tablet Take 5 mg by mouth daily. 03/17/18   [provider]  diphenhydrAMINE (BENADRYL) 25 mg capsule Take 1 capsule (25 mg total) by mouth every 6 (six) hours as needed for itching or  allergies. 04/21/18   Nicholes Mango, MD  docusate sodium (COLACE) 100 MG capsule Take 1 capsule (100 mg total) by mouth 2 (two) times daily as needed for mild constipation. 04/21/18   Nicholes Mango, MD  ferrous sulfate 325 (65 FE) MG EC tablet Take 1 tablet (325 mg total) by mouth 2 (two) times daily. 04/21/18 04/21/19  Nicholes Mango, MD  ipratropium (ATROVENT HFA) 17 MCG/ACT inhaler Inhale 2 puffs into the lungs 4 (four) times daily as needed for wheezing.     [provider]  lidocaine (LIDODERM) 5 % Place 1 patch onto the skin daily. Remove & Discard patch within 12 hours or as directed by MD 04/21/18   Nicholes Mango, MD  lidocaine-prilocaine (EMLA) cream Apply 1 application topically as needed (dialysis access).    [provider]  multivitamin (RENA-VIT) TABS tablet Take 1 tablet by mouth at bedtime. 04/21/18   Nicholes Mango, MD  nitroGLYCERIN (NITROSTAT) 0.4 MG SL tablet Place 0.4 mg under the tongue every 5 (five) minutes as needed for chest pain.    [provider]  Nutritional Supplements (FEEDING SUPPLEMENT, NEPRO CARB STEADY,) LIQD Take 237 mLs by mouth 2 (two) times daily between meals. 04/21/18   Nicholes Mango, MD  omeprazole (PRILOSEC) 40 MG capsule Take 1 capsule (40 mg total) by mouth 2 (two) times daily. 04/21/18   Nicholes Mango, MD  OXYGEN Inhale into the lungs daily as needed. Wears O2 at 2L [place] at home at night and as needed in the day    [provider]  traMADol (ULTRAM) 50 MG tablet Take 1 tablet (50 mg total) by mouth every 6 (six) hours as needed for moderate pain or severe pain. 04/21/18   Nicholes Mango, MD    Allergies  Allergen Reactions  . Sulfa Antibiotics Rash and Shortness Of Breath    Family History  Problem Relation Age of Onset  . Hypertension Son   . Diabetes Son   . Hypertension Mother   . Diabetes Mother   . Diabetes Sister     Social History Social History   Tobacco Use  . Smoking status: Former Smoker    Packs/day: 0.25     Years: 40.00    Pack years: 10.00    Types: Cigarettes    Last attempt to quit: 10/12/2016    Years since quitting: 1.5  . Smokeless tobacco: Never Used  Substance Use Topics  . Alcohol use: No    Alcohol/week: 0.0 standard drinks  . Drug use: Yes    Types: Cocaine, Marijuana    Comment: patient states no drugs    Review of Systems Unable to complete an adequate/accurate review of systems secondary to patient  participation, weakness, unable to answer questions at this time due to not feeling well.  ____________________________________________   PHYSICAL EXAM:  VITAL SIGNS: ED Triage Vitals  Enc Vitals Group     BP 05/03/18 1745 (!) 64/40     Pulse Rate 05/03/18 1745 (!) 102     Resp 05/03/18 1745 19     Temp --      Temp src --      SpO2 05/03/18 1745 98 %     Weight 05/03/18 1752 164 lb 3.9 oz (74.5 kg)     Height 05/03/18 1752 5\' 8"  (1.727 m)     Head Circumference --      Peak Flow --      Pain Score --      Pain Loc --      Pain Edu? --      Excl. in [place]? --    Constitutional: Patient is awake alert, but not able to answer many questions, states he feels very lightheaded whenever I asked a question he just moans and says his stomach is hurting or is feeling nauseated.  Cannot keep the patient engaged in conversation to answer questions or follow commands. Eyes: Normal exam ENT   Head: Normocephalic and atraumatic.   Mouth/Throat: Mucous membranes are moist. Cardiovascular: Normal rate, regular rhythm around 100 bpm.  Respiratory: Normal respiratory effort without tachypnea nor retractions. Breath sounds are clear  Gastrointestinal: Soft and nontender. No distention.   Musculoskeletal: Nontender with normal range of motion in all extremities.  Left upper extremity fistula Neurologic: Normal speech, moaning, unable to keep the patient engaged in conversation answer questions.  Appears to be moving all extremities well. Skin:  Skin is warm, dry and intact.   Psychiatric: Mood and affect are normal.  ____________________________________________    EKG  EKG viewed and interpreted by myself shows a sinus tachycardia 102 bpm with a narrow QRS, normal axis, largely normal intervals, nonspecific ST changes with ST depressions in the lateral leads.  EKG changes appear to be new compared to last EKG but no ST elevation.  ____________________________________________    RADIOLOGY   IMPRESSION: 1. ET tube tip at the level of the clavicular heads. 2. OG tube tip and side port project over the gastric fundus.  ____________________________________________   INITIAL IMPRESSION / ASSESSMENT AND PLAN / ED COURSE  Pertinent labs & imaging results that were available during my care of the patient were reviewed by me and considered in my medical decision making (see chart for details).  Patient presents to the emergency department for initially lower GI bleed and hypotension.  Started large-bore IVs with 1 L of IV fluids.  Patient is not able to answer questions or follow commands she is moaning on the bed saying he does not feel good or that he is nauseated, but unwilling to answer questions.  Moving all extremities, awake and alert.  Patient is quite hypotensive upon arrival with blood pressures in the 60s.  Shortly after arrival patient became very nauseated began vomiting initially dark and then bright red blood.  Had a brief syncopal episode.  Patient was started on a Protonix infusion, I ordered initially 2 units of emergency release blood followed by an additional 2 units of emergency release blood.  After the patient began vomiting and feeling lightheaded the decision was made by myself to intubate for airway protection.  Patient was intubated successfully without complication.  Started on propofol infusion.  I discussed the patient with Dr. Marius Ditch  of GI medicine who will begin to perform an endoscopy at some point.  Per record review patient appears to  have a duodenal ulcer noted during his last admission at the end of February for a GI bleed at that time.  Very likely repeat bleeding from duodenal ulcer given upper and lower GI bleed at this time.  We will admit to the ICU.  Patient's blood pressure is improving after fourth unit of blood 152/52 currently.  Initial H&H resulted at 5.5.   INTUBATION Performed by: Harvest Dark  Required items: required blood products, implants, devices, and special equipment available Patient identity confirmed: provided demographic data and hospital-assigned identification number Time out: Immediately prior to procedure a "time out" was called to verify the correct patient, procedure, equipment, support staff and site/side marked as required.  Indications: Airway protection  Intubation method: 4.0 Glidescope Laryngoscopy   Preoxygenation: 100% BVM  Sedatives: 25 mg etomidate Paralytic: 100 mg rocuronium  Tube Size: 8.0 cuffed  Post-procedure assessment: chest rise and ETCO2 monitor Breath sounds: equal and absent over the epigastrium Tube secured with: ETT holder Chest x-ray interpreted by radiologist and me.  Chest x-ray findings: endotracheal tube in appropriate position  Patient tolerated the procedure well with no immediate complications.    CRITICAL CARE Performed by: Harvest Dark   Total critical care time: 60 minutes  Critical care time was exclusive of separately billable procedures and treating other patients.  Critical care was necessary to treat or prevent imminent or life-threatening deterioration.  Critical care was time spent personally by me on the following activities: development of treatment plan with patient and/or surrogate as well as nursing, discussions with consultants, evaluation of patient's response to treatment, examination of patient, obtaining history from patient or surrogate, ordering and performing treatments and interventions, ordering and review  of laboratory studies, ordering and review of radiographic studies, pulse oximetry and re-evaluation of patient's condition.   ____________________________________________   FINAL CLINICAL IMPRESSION(S) / ED DIAGNOSES  Upper GI bleed Acute blood loss Symptomatic anemia   Harvest Dark, MD 05/04/18 0002

## 2018-05-03 NOTE — H&P (Addendum)
[place] [facility] [place] NAME: [name]    MR#:  [id number]  DATE OF BIRTH:  10-26-1955  DATE OF ADMISSION:  05/03/2018  PRIMARY CARE PHYSICIAN: Donnie Coffin, MD   REQUESTING/REFERRING PHYSICIAN: dr Wilnette Kales  CHIEF COMPLAINT:  Rectal bleeding  HISTORY OF PRESENT ILLNESS:  Per [name]  is a 63 y.o. male with a known history of end-stage renal disease on hemodialysis, diabetes, recent discharge from the hospital with melena from nonbleeding cratered duodenal ulcer and chronic combined systolic and diastolic heart failure who presents via EMS to the emergency room due to rectal bleeding. Patient is now intubated due to lethargy and  coffee-ground emesis. He is on her fourth unit of blood S According to ER MD and ER report patient reported to EMS that she lost 250 cc of blood in the toilet and while walking through the house. Patient has had several containers full of blood loss since coming to the ED.  No other information can be obtained  PAST MEDICAL HISTORY:   Past Medical History:  Diagnosis Date  . Arthritis   . CAD (coronary artery disease)    a. 09/2013 Myoview [facility]): basal inf defect more pronounced @ rest, likely artifact, EF 48%, low risk study; b. 06/2016 Cath [facility] LAD 10p, 30-43m, 40-50d, D1 90(small), D2 40, D3 60-70, D4 30, LCX 10p, 58m, 20d, Om1 30, OM3 40, RCA 100p w/ bridging collats, m/d RCA fill via collats, small/mod caliber-->Med Rx; c. 02/2017 MV: inf infarct w/ peri-inf ischemia. EF <30%.   . Chronic combined systolic and diastolic CHF (congestive heart failure) ([place])    a. 10/2013 Echo [facility]): EF 50%, diast dysfxn;  b. 10/2014 Echo: EF 30-35%, diff HK, gr1 DD;  c. 04/2016 Echo: EF 20-25%, sev dil LV, diff HK, gr3 DD; d. 06/2016 Echo: EF 30-35%, Gr2 DD.  . Diabetes ([place])   . Dyspnea   . ESRD (end stage renal disease) ([place])    a. MWF dialysis @ [facility].  . Hepatitis    Patient is unsure type of Hepatitis he  has  . Hypertension   . Hypertensive heart disease with CHF (congestive heart failure) ([place])   . Mixed Ischemic and non-ischemic cardiomyopathy    a. 10/2013 Echo [facility]): EF 50%, diast dysfxn;  b. 10/2014 Echo: EF 30-35%, diff HK, gr1 DD;  c. 04/2016 Echo: EF 20-25%, sev dil LV, diff HK, gr3 DD; d. 06/2016 Echo: EF 30-35%, Gr2 DD.  [name] Moderate Aortic insufficiency    a. 04/2016 Echo: Mod AI.  [name] Myocardial infarction ([place])   . Peripheral vascular disease ([place])   . Pulmonary hypertension ([place])    a. 10/2014 Echo: Mod-Sev PAH.  [name] PVC's (premature ventricular contractions)    a. 02/2017 24 hr holter: freq polymorphic PVCs w/ total of 38K beats in 24 hrs (34% burden).    PAST SURGICAL HISTORY:   Past Surgical History:  Procedure Laterality Date  . A/V FISTULAGRAM Left 04/06/2016   Procedure: A/V Fistulagram;  Surgeon: Katha Cabal, MD;  Location: [place] [facility];  Service: Cardiovascular;  Laterality: Left;  . A/V FISTULAGRAM Left 01/19/2017   Procedure: A/V FISTULAGRAM;  Surgeon: Katha Cabal, MD;  Location: [place] [facility];  Service: Cardiovascular;  Laterality: Left;  . A/V SHUNT INTERVENTION N/A 04/06/2016   Procedure: A/V Shunt Intervention;  Surgeon: Katha Cabal, MD;  Location: [place] [facility];  Service: Cardiovascular;  Laterality: N/A;  . A/V SHUNT  INTERVENTION N/A 06/29/2016   Procedure: A/V Shunt Intervention;  Surgeon: Katha Cabal, MD;  Location: [place] [facility];  Service: Cardiovascular;  Laterality: N/A;  . AV FISTULA PLACEMENT Left 2014  . AV FISTULA PLACEMENT Left 06/01/2017   Procedure: INSERTION OF ARTERIOVENOUS (AV) GORE-TEX GRAFT ARM ( BRACHIAL AXILLARY );  Surgeon: Katha Cabal, MD;  Location: [facility] ORS;  Service: Vascular;  Laterality: Left;  . DIALYSIS/PERMA CATHETER INSERTION Right    Dr. Delana Meyer  . DIALYSIS/PERMA CATHETER REMOVAL N/A 07/05/2017   Procedure: DIALYSIS/PERMA CATHETER REMOVAL;  Surgeon: Katha Cabal, MD;   Location: [place] [facility];  Service: Cardiovascular;  Laterality: N/A;  . ESOPHAGOGASTRODUODENOSCOPY (EGD) WITH PROPOFOL N/A 04/20/2018   Procedure: ESOPHAGOGASTRODUODENOSCOPY (EGD) WITH PROPOFOL;  Surgeon: Jonathon Bellows, MD;  Location: [facility];  Service: Gastroenterology;  Laterality: N/A;  . PERIPHERAL VASCULAR CATHETERIZATION N/A 10/03/2014   Procedure: A/V Shuntogram/Fistulagram;  Surgeon: Algernon Huxley, MD;  Location: [place] [facility];  Service: Cardiovascular;  Laterality: N/A;  . PERIPHERAL VASCULAR CATHETERIZATION Left 10/03/2014   Procedure: A/V Shunt Intervention;  Surgeon: Algernon Huxley, MD;  Location: [place] [facility];  Service: Cardiovascular;  Laterality: Left;  . PERIPHERAL VASCULAR CATHETERIZATION Left 05/01/2015   Procedure: A/V Shuntogram/Fistulagram;  Surgeon: Algernon Huxley, MD;  Location: [place] [facility];  Service: Cardiovascular;  Laterality: Left;  . PERIPHERAL VASCULAR CATHETERIZATION N/A 05/01/2015   Procedure: A/V Shunt Intervention;  Surgeon: Algernon Huxley, MD;  Location: [place] [facility];  Service: Cardiovascular;  Laterality: N/A;  . UPPER EXTREMITY ANGIOGRAPHY Bilateral 06/29/2016   Procedure: Upper Extremity Angiography;  Surgeon: Katha Cabal, MD;  Location: [place] [facility];  Service: Cardiovascular;  Laterality: Bilateral;  . UPPER EXTREMITY INTERVENTION  06/29/2016   Procedure: Upper Extremity Intervention;  Surgeon: Katha Cabal, MD;  Location: [place] [facility];  Service: Cardiovascular;;  . UPPER EXTREMITY VENOGRAPHY Left 03/15/2017   Procedure: UPPER EXTREMITY VENOGRAPHY;  Surgeon: Katha Cabal, MD;  Location: [place] [facility];  Service: Cardiovascular;  Laterality: Left;    SOCIAL HISTORY:   Social History   Tobacco Use  . Smoking status: Former Smoker    Packs/day: 0.25    Years: 40.00    Pack years: 10.00    Types: Cigarettes    Last attempt to quit: 10/12/2016    Years since quitting: 1.5  . Smokeless tobacco:  Never Used  Substance Use Topics  . Alcohol use: No    Alcohol/week: 0.0 standard drinks    FAMILY HISTORY:   Family History  Problem Relation Age of Onset  . Hypertension Son   . Diabetes Son   . Hypertension Mother   . Diabetes Mother   . Diabetes Sister     DRUG ALLERGIES:   Allergies  Allergen Reactions  . Sulfa Antibiotics Rash and Shortness Of Breath    REVIEW OF SYSTEMS:   Review of Systems  Unable to perform ROS: Acuity of condition    MEDICATIONS AT HOME:   Prior to Admission medications   Medication Sig Start Date End Date Taking? Authorizing Provider  acetaminophen (TYLENOL) 325 MG tablet Take 2 tablets (650 mg total) by mouth every 6 (six) hours as needed for mild pain (or Fever >/= 101). Patient taking differently: Take 500 mg by mouth every 8 (eight) hours as needed for mild pain (or Fever >/= 101).  09/21/16   Gouru, Aruna, MD  albuterol (PROVENTIL HFA;VENTOLIN HFA) 108 (90 Base) MCG/ACT  inhaler Inhale 2 puffs into the lungs every 4 (four) hours as needed for wheezing or shortness of breath.     [provider]  amiodarone (PACERONE) 200 MG tablet Take 1 tablet (200 mg total) by mouth daily. 04/06/17   Theora Gianotti, NP  aspirin EC 81 MG tablet Take 1 tablet (81 mg total) by mouth daily. 04/21/18 04/21/19  Nicholes Mango, MD  atorvastatin (LIPITOR) 40 MG tablet Take 1 tablet (40 mg total) by mouth daily. Patient taking differently: Take 40 mg by mouth every evening.  02/19/17   Demetrios Loll, MD  calcium acetate (PHOSLO) 667 MG capsule Take 1,334-2,001 mg by mouth 3 (three) times daily with meals. take 3 capsules (2001mg ) by mouth three times daily before each meal, and 2 capsules (667mg ) by mouth with snacks twice daily 12/11/15   [provider]  cinacalcet (SENSIPAR) 90 MG tablet Take 60 mg by mouth daily.     [provider]  cyclobenzaprine (FLEXERIL) 5 MG tablet Take 5 mg by mouth daily. 03/17/18   [provider]   diphenhydrAMINE (BENADRYL) 25 mg capsule Take 1 capsule (25 mg total) by mouth every 6 (six) hours as needed for itching or allergies. 04/21/18   Nicholes Mango, MD  docusate sodium (COLACE) 100 MG capsule Take 1 capsule (100 mg total) by mouth 2 (two) times daily as needed for mild constipation. 04/21/18   Nicholes Mango, MD  ferrous sulfate 325 (65 FE) MG EC tablet Take 1 tablet (325 mg total) by mouth 2 (two) times daily. 04/21/18 04/21/19  Nicholes Mango, MD  ipratropium (ATROVENT HFA) 17 MCG/ACT inhaler Inhale 2 puffs into the lungs 4 (four) times daily as needed for wheezing.     [provider]  lidocaine (LIDODERM) 5 % Place 1 patch onto the skin daily. Remove & Discard patch within 12 hours or as directed by MD 04/21/18   Nicholes Mango, MD  lidocaine-prilocaine (EMLA) cream Apply 1 application topically as needed (dialysis access).    [provider]  multivitamin (RENA-VIT) TABS tablet Take 1 tablet by mouth at bedtime. 04/21/18   Nicholes Mango, MD  nitroGLYCERIN (NITROSTAT) 0.4 MG SL tablet Place 0.4 mg under the tongue every 5 (five) minutes as needed for chest pain.    [provider]  Nutritional Supplements (FEEDING SUPPLEMENT, NEPRO CARB STEADY,) LIQD Take 237 mLs by mouth 2 (two) times daily between meals. 04/21/18   Nicholes Mango, MD  omeprazole (PRILOSEC) 40 MG capsule Take 1 capsule (40 mg total) by mouth 2 (two) times daily. 04/21/18   Nicholes Mango, MD  OXYGEN Inhale into the lungs daily as needed. Wears O2 at 2L [place] at home at night and as needed in the day    [provider]  traMADol (ULTRAM) 50 MG tablet Take 1 tablet (50 mg total) by mouth every 6 (six) hours as needed for moderate pain or severe pain. 04/21/18   Gouru, Illene Silver, MD      VITAL SIGNS:  Blood pressure (!) 64/40, pulse (!) 102, resp. rate 19, height 5\' 8"  (1.727 m), weight 74.5 kg, SpO2 98 %.  PHYSICAL EXAMINATION:   Physical Exam Constitutional:      General: He is not in acute  distress.    Comments: Sedated on vent intubated  HENT:     Head: Normocephalic.  Eyes:     General: No scleral icterus. Neck:     Vascular: No JVD.     Trachea: No tracheal deviation.  Cardiovascular:  Rate and Rhythm: Normal rate and regular rhythm.     Heart sounds: Normal heart sounds. No murmur. No friction rub. No gallop.   Pulmonary:     Effort: Pulmonary effort is normal. No respiratory distress.     Breath sounds: Normal breath sounds. No wheezing or rales.  Chest:     Chest wall: No tenderness.  Abdominal:     General: Bowel sounds are normal. There is no distension.     Palpations: Abdomen is soft. There is no mass.     Tenderness: There is no abdominal tenderness. There is no guarding or rebound.  Musculoskeletal:     Right lower leg: No edema.     Left lower leg: No edema.  Skin:    General: Skin is warm.     Findings: No erythema or rash.  Neurological:     Comments: Intubated and sedated  Psychiatric:        Judgment: Judgment normal.     Comments: Sedated       LABORATORY PANEL:   CBC Recent Labs  Lab 05/03/18 1747  WBC 8.8  HGB 5.5*  HCT 18.5*  PLT 183   ------------------------------------------------------------------------------------------------------------------  Chemistries  No results for input(s): NA, K, CL, CO2, GLUCOSE, BUN, CREATININE, CALCIUM, MG, AST, ALT, ALKPHOS, BILITOT in the last 168 hours.  Invalid input(s): GFRCGP ------------------------------------------------------------------------------------------------------------------  Cardiac Enzymes No results for input(s): TROPONINI in the last 168 hours. ------------------------------------------------------------------------------------------------------------------  RADIOLOGY:  No results found.  EKG:   Orders placed or performed during the hospital encounter of 04/17/18  . ED EKG  . ED EKG    IMPRESSION AND PLAN:   63 year old with end-stage renal disease  on hemodialysis and recent discharge from the hospital with nonbleeding cratered duodenal ulcer who presents with rectal bleeding.  1.  Brisk upper GI bleed with acute on chronic blood loss anemia: Patient is on her fourth round of PRBC ER physician has spoken with GI physician who will come in to see patient Continue Protonix drip Follow hemoglobin Case discussed with intensivist. 2.  Hypertension: Hold all medications for now  3.  End-stage renal disease on hemodialysis: Consultation with nephrology will need to be ordered in a.m. 4 dialysis once stable.  Patient critically ill and high risk of cardic arrest  All the records are reviewed and case discussed with ED provider.  CODE STATUS: full  Critical care TOTAL TIME TAKING CARE OF THIS PATIENT: 60 minutes.    Khambrel Amsden M.D on 05/03/2018 at 6:43 PM  Between 7am to 6pm - Pager - [phone]  After 6pm go to [email or web address] - password [email or web address] [facility]  Office  [phone]  CC: Primary care physician; Donnie Coffin, MD

## 2018-05-04 ENCOUNTER — Inpatient Hospital Stay: Payer: Medicare Other | Admitting: Registered Nurse

## 2018-05-04 ENCOUNTER — Encounter
Admission: EM | Disposition: A | Discharge status: Other Acute Inpatient Hospital | Payer: Self-pay | Source: Home / Self Care | Attending: Internal Medicine

## 2018-05-04 ENCOUNTER — Inpatient Hospital Stay: Payer: Medicare Other

## 2018-05-04 ENCOUNTER — Encounter: Payer: Self-pay | Admitting: Radiology

## 2018-05-04 DIAGNOSIS — K317 Polyp of stomach and duodenum: Secondary | ICD-10-CM

## 2018-05-04 DIAGNOSIS — K922 Gastrointestinal hemorrhage, unspecified: Secondary | ICD-10-CM

## 2018-05-04 DIAGNOSIS — K920 Hematemesis: Secondary | ICD-10-CM

## 2018-05-04 DIAGNOSIS — K264 Chronic or unspecified duodenal ulcer with hemorrhage: Principal | ICD-10-CM

## 2018-05-04 HISTORY — PX: ESOPHAGOGASTRODUODENOSCOPY: SHX5428

## 2018-05-04 LAB — RENAL FUNCTION PANEL
Albumin: 2.1 g/dL — ABNORMAL LOW (ref 3.5–5.0)
Albumin: 2.3 g/dL — ABNORMAL LOW (ref 3.5–5.0)
Albumin: 2.2 g/dL — ABNORMAL LOW (ref 3.5–5.0)
Albumin: 2.1 g/dL — ABNORMAL LOW (ref 3.5–5.0)
Anion gap: 8 (ref 5–15)
Anion gap: 9 (ref 5–15)
Anion gap: 8 (ref 5–15)
Anion gap: 8 (ref 5–15)
BUN: 65 mg/dL — ABNORMAL HIGH (ref 8–23)
BUN: 57 mg/dL — ABNORMAL HIGH (ref 8–23)
BUN: 54 mg/dL — ABNORMAL HIGH (ref 8–23)
BUN: 44 mg/dL — ABNORMAL HIGH (ref 8–23)
CO2: 23 mmol/L (ref 22–32)
CO2: 23 mmol/L (ref 22–32)
CO2: 23 mmol/L (ref 22–32)
CO2: 23 mmol/L (ref 22–32)
CREATININE: 3.06 mg/dL — AB (ref 0.61–1.24)
Calcium: 7.4 mg/dL — ABNORMAL LOW (ref 8.9–10.3)
Calcium: 7.9 mg/dL — ABNORMAL LOW (ref 8.9–10.3)
Calcium: 7.7 mg/dL — ABNORMAL LOW (ref 8.9–10.3)
Calcium: 7.9 mg/dL — ABNORMAL LOW (ref 8.9–10.3)
Chloride: 112 mmol/L — ABNORMAL HIGH (ref 98–111)
Chloride: 111 mmol/L (ref 98–111)
Chloride: 110 mmol/L (ref 98–111)
Chloride: 106 mmol/L (ref 98–111)
Creatinine, Ser: 4.37 mg/dL — ABNORMAL HIGH (ref 0.61–1.24)
Creatinine, Ser: 4.12 mg/dL — ABNORMAL HIGH (ref 0.61–1.24)
Creatinine, Ser: 3.79 mg/dL — ABNORMAL HIGH (ref 0.61–1.24)
GFR calc Af Amer: 15 mL/min — ABNORMAL LOW (ref >60)
GFR calc Af Amer: 18 mL/min — ABNORMAL LOW (ref >60)
GFR calc non Af Amer: 13 mL/min — ABNORMAL LOW (ref >60)
GFR calc non Af Amer: 14 mL/min — ABNORMAL LOW (ref >60)
GFR calc non Af Amer: 16 mL/min — ABNORMAL LOW (ref >60)
GFR, EST AFRICAN AMERICAN: 17 mL/min — AB (ref >60)
GFR, EST AFRICAN AMERICAN: 24 mL/min — AB (ref >60)
GFR, EST NON AFRICAN AMERICAN: 20 mL/min — AB (ref >60)
Glucose, Bld: 60 mg/dL — ABNORMAL LOW (ref 70–99)
Glucose, Bld: 90 mg/dL (ref 70–99)
Glucose, Bld: 171 mg/dL — ABNORMAL HIGH (ref 70–99)
Glucose, Bld: 202 mg/dL — ABNORMAL HIGH (ref 70–99)
PHOSPHORUS: 5.7 mg/dL — AB (ref 2.5–4.6)
PHOSPHORUS: 4.5 mg/dL (ref 2.5–4.6)
POTASSIUM: 4.5 mmol/L (ref 3.5–5.1)
Phosphorus: 5 mg/dL — ABNORMAL HIGH (ref 2.5–4.6)
Phosphorus: 4.6 mg/dL (ref 2.5–4.6)
Potassium: 6 mmol/L — ABNORMAL HIGH (ref 3.5–5.1)
Potassium: 5.6 mmol/L — ABNORMAL HIGH (ref 3.5–5.1)
Potassium: 4.3 mmol/L (ref 3.5–5.1)
Sodium: 143 mmol/L (ref 135–145)
Sodium: 143 mmol/L (ref 135–145)
Sodium: 141 mmol/L (ref 135–145)
Sodium: 137 mmol/L (ref 135–145)

## 2018-05-04 LAB — HEMOGLOBIN: Hemoglobin: 9 g/dL — ABNORMAL LOW (ref 13.0–17.0)

## 2018-05-04 LAB — BASIC METABOLIC PANEL
ANION GAP: 7 (ref 5–15)
ANION GAP: 8 (ref 5–15)
Anion gap: 9 (ref 5–15)
BUN: 59 mg/dL — ABNORMAL HIGH (ref 8–23)
BUN: 61 mg/dL — ABNORMAL HIGH (ref 8–23)
BUN: 50 mg/dL — ABNORMAL HIGH (ref 8–23)
CO2: 23 mmol/L (ref 22–32)
CO2: 22 mmol/L (ref 22–32)
CO2: 23 mmol/L (ref 22–32)
Calcium: 7 mg/dL — ABNORMAL LOW (ref 8.9–10.3)
Calcium: 7.6 mg/dL — ABNORMAL LOW (ref 8.9–10.3)
Calcium: 7.6 mg/dL — ABNORMAL LOW (ref 8.9–10.3)
Chloride: 111 mmol/L (ref 98–111)
Chloride: 109 mmol/L (ref 98–111)
Chloride: 106 mmol/L (ref 98–111)
Creatinine, Ser: 4.29 mg/dL — ABNORMAL HIGH (ref 0.61–1.24)
Creatinine, Ser: 4.11 mg/dL — ABNORMAL HIGH (ref 0.61–1.24)
Creatinine, Ser: 3.54 mg/dL — ABNORMAL HIGH (ref 0.61–1.24)
GFR calc Af Amer: 16 mL/min — ABNORMAL LOW (ref >60)
GFR calc Af Amer: 17 mL/min — ABNORMAL LOW (ref >60)
GFR calc Af Amer: 20 mL/min — ABNORMAL LOW (ref >60)
GFR calc non Af Amer: 14 mL/min — ABNORMAL LOW (ref >60)
GFR calc non Af Amer: 14 mL/min — ABNORMAL LOW (ref >60)
GFR calc non Af Amer: 17 mL/min — ABNORMAL LOW (ref >60)
Glucose, Bld: 89 mg/dL (ref 70–99)
Glucose, Bld: 77 mg/dL (ref 70–99)
Glucose, Bld: 212 mg/dL — ABNORMAL HIGH (ref 70–99)
Potassium: 6.9 mmol/L (ref 3.5–5.1)
Potassium: 5.7 mmol/L — ABNORMAL HIGH (ref 3.5–5.1)
Potassium: 4.2 mmol/L (ref 3.5–5.1)
Sodium: 141 mmol/L (ref 135–145)
Sodium: 140 mmol/L (ref 135–145)
Sodium: 137 mmol/L (ref 135–145)

## 2018-05-04 LAB — GLUCOSE, CAPILLARY
Glucose-Capillary: 107 mg/dL — ABNORMAL HIGH (ref 70–99)
Glucose-Capillary: 150 mg/dL — ABNORMAL HIGH (ref 70–99)
Glucose-Capillary: 156 mg/dL — ABNORMAL HIGH (ref 70–99)
Glucose-Capillary: 165 mg/dL — ABNORMAL HIGH (ref 70–99)
Glucose-Capillary: 169 mg/dL — ABNORMAL HIGH (ref 70–99)
Glucose-Capillary: 169 mg/dL — ABNORMAL HIGH (ref 70–99)
Glucose-Capillary: 172 mg/dL — ABNORMAL HIGH (ref 70–99)
Glucose-Capillary: 227 mg/dL — ABNORMAL HIGH (ref 70–99)
Glucose-Capillary: 53 mg/dL — ABNORMAL LOW (ref 70–99)

## 2018-05-04 LAB — BLOOD GAS, ARTERIAL
Acid-base deficit: 1.7 mmol/L (ref 0.0–2.0)
Bicarbonate: 23 mmol/L (ref 20.0–28.0)
FIO2: 0.35
MECHVT: 550 mL
O2 Saturation: 98.8 %
PEEP: 5 cmH2O
Patient temperature: 37
RATE: 15 resp/min
pCO2 arterial: 38 mmHg (ref 32.0–48.0)
pH, Arterial: 7.39 (ref 7.350–7.450)
pO2, Arterial: 126 mmHg — ABNORMAL HIGH (ref 83.0–108.0)

## 2018-05-04 LAB — PREPARE FRESH FROZEN PLASMA

## 2018-05-04 LAB — FERRITIN: Ferritin: 126 ng/mL (ref 24–336)

## 2018-05-04 LAB — CBC
HEMATOCRIT: 24.4 % — AB (ref 39.0–52.0)
Hemoglobin: 8 g/dL — ABNORMAL LOW (ref 13.0–17.0)
MCH: 30.1 pg (ref 26.0–34.0)
MCHC: 32.8 g/dL (ref 30.0–36.0)
MCV: 91.7 fL (ref 80.0–100.0)
Platelets: 169 10*3/uL (ref 150–400)
RBC: 2.66 MIL/uL — ABNORMAL LOW (ref 4.22–5.81)
RDW: 17.6 % — ABNORMAL HIGH (ref 11.5–15.5)
WBC: 16.3 10*3/uL — ABNORMAL HIGH (ref 4.0–10.5)
nRBC: 0.9 % — ABNORMAL HIGH (ref 0.0–0.2)

## 2018-05-04 LAB — PROCALCITONIN: Procalcitonin: 4.59 ng/mL

## 2018-05-04 LAB — PREPARE RBC (CROSSMATCH)

## 2018-05-04 LAB — HEMOGLOBIN AND HEMATOCRIT, BLOOD
HCT: 27.7 % — ABNORMAL LOW (ref 39.0–52.0)
HEMATOCRIT: 26.3 % — AB (ref 39.0–52.0)
Hemoglobin: 9 g/dL — ABNORMAL LOW (ref 13.0–17.0)
Hemoglobin: 8.7 g/dL — ABNORMAL LOW (ref 13.0–17.0)

## 2018-05-04 LAB — FOLATE: Folate: 5.4 ng/mL — ABNORMAL LOW (ref >5.9)

## 2018-05-04 LAB — IRON AND TIBC
IRON: 176 ug/dL (ref 45–182)
SATURATION RATIOS: 84 % — AB (ref 17.9–39.5)
TIBC: 209 ug/dL — ABNORMAL LOW (ref 250–450)
UIBC: 33 ug/dL

## 2018-05-04 LAB — BPAM FFP
BLOOD PRODUCT EXPIRATION DATE: 202003172359
Unit Type and Rh: 5100

## 2018-05-04 LAB — MAGNESIUM: MAGNESIUM: 1.6 mg/dL — AB (ref 1.7–2.4)

## 2018-05-04 LAB — VITAMIN B12: Vitamin B-12: 240 pg/mL (ref 180–914)

## 2018-05-04 SURGERY — EGD (ESOPHAGOGASTRODUODENOSCOPY)
Anesthesia: General

## 2018-05-04 MED ORDER — ACETAMINOPHEN 650 MG RE SUPP: 650.0000 mg | Freq: Four times a day (QID) | RECTAL | Status: DC | PRN

## 2018-05-04 MED ORDER — PUREFLOW DIALYSIS SOLUTION
INTRAVENOUS | Status: DC
  Administered 2018-05-04: 22:00:00 via INTRAVENOUS_CENTRAL

## 2018-05-04 MED ORDER — CALCIUM GLUCONATE-NACL 1-0.675 GM/50ML-% IV SOLN: 1.0000 g | Freq: Once | INTRAVENOUS | Status: DC

## 2018-05-04 MED ORDER — FENTANYL CITRATE (PF) 100 MCG/2ML IJ SOLN: 25.0000 ug | INTRAMUSCULAR | Status: DC | PRN

## 2018-05-04 MED ORDER — ORAL CARE MOUTH RINSE
15.0000 mL | OROMUCOSAL | Status: DC
  Administered 2018-05-04 – 2018-05-05 (×12): 15 mL via OROMUCOSAL

## 2018-05-04 MED ORDER — ONDANSETRON HCL 4 MG/2ML IJ SOLN: 4.0000 mg | Freq: Once | INTRAMUSCULAR | Status: DC | PRN

## 2018-05-04 MED ORDER — INSULIN REGULAR HUMAN 100 UNIT/ML IJ SOLN
10.0000 [IU] | Freq: Once | INTRAMUSCULAR | Status: DC
  Filled 2018-05-04: qty 10

## 2018-05-04 MED ORDER — IOPAMIDOL (ISOVUE-370) INJECTION 76%
100.0000 mL | Freq: Once | INTRAVENOUS | Status: AC | PRN
  Administered 2018-05-04: 100 mL via INTRAVENOUS

## 2018-05-04 MED ORDER — SODIUM BICARBONATE 8.4 % IV SOLN
INTRAVENOUS | Status: AC
  Administered 2018-05-04: 05:00:00
  Filled 2018-05-04: qty 50

## 2018-05-04 MED ORDER — DEXTROSE 50 % IV SOLN
INTRAVENOUS | Status: AC
  Administered 2018-05-04: 50 mL
  Filled 2018-05-04: qty 50

## 2018-05-04 MED ORDER — HEPARIN SODIUM (PORCINE) 1000 UNIT/ML IJ SOLN
1000.0000 [IU] | INTRAMUSCULAR | Status: DC | PRN
  Filled 2018-05-04: qty 6

## 2018-05-04 MED ORDER — DEXTROSE 10 % IV SOLN
INTRAVENOUS | Status: DC
  Administered 2018-05-04 (×2): via INTRAVENOUS

## 2018-05-04 MED ORDER — DEXTROSE 50 % IV SOLN: 1.0000 | Freq: Once | INTRAVENOUS | Status: AC

## 2018-05-04 MED ORDER — SODIUM CHLORIDE 0.9 % IV SOLN
3.0000 g | Freq: Two times a day (BID) | INTRAVENOUS | Status: DC
  Filled 2018-05-04 (×2): qty 3

## 2018-05-04 MED ORDER — NOREPINEPHRINE 16 MG/250ML-% IV SOLN
0.0000 ug/min | INTRAVENOUS | Status: DC
  Administered 2018-05-04: 20 ug/min via INTRAVENOUS
  Administered 2018-05-05: 4 ug/min via INTRAVENOUS
  Filled 2018-05-04 (×2): qty 250

## 2018-05-04 MED ORDER — SODIUM CHLORIDE 0.9% IV SOLUTION
Freq: Once | INTRAVENOUS | Status: AC
  Administered 2018-05-04: 06:00:00 via INTRAVENOUS

## 2018-05-04 MED ORDER — DEXTROSE 50 % IV SOLN
INTRAVENOUS | Status: AC
  Administered 2018-05-04: 05:00:00
  Filled 2018-05-04: qty 50

## 2018-05-04 MED ORDER — ACETAMINOPHEN 325 MG PO TABS: 650.0000 mg | ORAL_TABLET | Freq: Four times a day (QID) | ORAL | Status: DC | PRN

## 2018-05-04 MED ORDER — CHLORHEXIDINE GLUCONATE 0.12% ORAL RINSE (MEDLINE KIT)
15.0000 mL | Freq: Two times a day (BID) | OROMUCOSAL | Status: DC
  Administered 2018-05-04 – 2018-05-05 (×4): 15 mL via OROMUCOSAL

## 2018-05-04 MED ORDER — MIDAZOLAM HCL 2 MG/2ML IJ SOLN
2.0000 mg | Freq: Once | INTRAMUSCULAR | Status: DC
  Filled 2018-05-04: qty 4

## 2018-05-04 NOTE — Progress Notes (Signed)
Per Dr Daphane Shepherd no intermit suction at this time. Orders to clamp tube.

## 2018-05-04 NOTE — Progress Notes (Signed)
Per blood draw Blood sugar 171

## 2018-05-04 NOTE — Procedures (Signed)
Hemodialysis Catheter Insertion Procedure Note [name] [id number] 04/03/1955  Procedure: Insertion of Hemodialysis Catheter Indications: Dialysis Access   Procedure Details Consent: Unable to obtain consent because of emergent medical necessity. Time Out: Verified patient identification, verified procedure, site/side was marked, verified correct patient position, special equipment/implants available, medications/allergies/relevent history reviewed, required imaging and test results available.  Performed  Maximum sterile technique was used including antiseptics, cap, gloves, gown, hand hygiene, mask and sheet. Skin prep: Chlorhexidine; local anesthetic administered Triple lumen hemodialysis catheter was inserted into right femoral vein due to patient being a dialysis patient using the Seldinger technique.  Evaluation Blood flow good Complications: No apparent complications Patient did tolerate procedure well. Chest X-ray ordered to verify placement.  CXR: Not applicable, placed in right femoral vein.   Procedure was performed using Ultrasound for direct visualization of cannulization of Right Femoral Vein.  Darel Hong, AGACNP-BC [facility] Pager: [phone] Cell: [phone]    [name] 05/04/2018

## 2018-05-04 NOTE — Consult Note (Signed)
PULMONARY / CRITICAL CARE MEDICINE   Name: [name] MRN: [id number] DOB: May 13, 1955    ADMISSION DATE:  05/03/2018 CONSULTATION DATE:  05/03/2018  REFERRING MD:  Dr. Benjie [name]  CHIEF COMPLAINT:  GI bleed  BRIEF DISCUSSION: 63 year old male admitted with GI bleed.  Given the significant amount of hematemesis he was intubated in the ED for airway protection.  Concern for aspiration.  Suspected source of the bleed is previously noted duodenal ulcer.  He has required multiple blood transfusions.  He required initiation of CRRT on 3/12 due to hyperkalemia with EKG changes.  GI is following, EGD is pending.  HISTORY OF PRESENT ILLNESS:   [name] is a 63 year old male with a past medical history notable for end-stage renal disease on hemodialysis, chronic combined systolic and diastolic CHF, diabetes, hepatitis, hypertension who presented to [facility] ED on 05/03/18 with complaints of rectal bleeding.  He reported that he went to HD earlier in the day, and when he came home started to have rectal bleeding.  Upon presentation to the ED, he was also noted to have significant coffee-ground emesis, requiring intubation in the ED for airway protection.  He was placed on Protonix drip, and has currently received 4 units of packed red blood cells. He recently underwent EGD approximately 2 weeks ago due to upper GI bleed, which revealed a clean-based duodenal ulcer that was not actively bleeding.  This is the suspected source of his current GI bleed.  Upon presentation to the ICU there was concern for possible seizure activity versus myoclonic jerking.  He was placed on IV Keppra, and CT head was obtained which was negative.   He also became hypotensive, requiring initiation of Levophed drip.  He underwent CT angiogram of the abdomen which did not reveal any active bleeding.  After receiving blood transfusions his potassium was noted to be 6.9 with EKG changes.  Therefore he required emergent placement of a temporary  dialysis catheter and initiation of CRRT.  PCCM is consulted for further management.  PAST MEDICAL HISTORY :  He  has a past medical history of Arthritis, CAD (coronary artery disease), Chronic combined systolic and diastolic CHF (congestive heart failure) ([place]), Diabetes ([place]), Dyspnea, ESRD (end stage renal disease) ([place]), Hepatitis, Hypertension, Hypertensive heart disease with CHF (congestive heart failure) ([place]), Mixed Ischemic and non-ischemic cardiomyopathy, Moderate Aortic insufficiency, Myocardial infarction [facility]), Peripheral vascular disease ([place]), Pulmonary hypertension ([place]), and PVC's (premature ventricular contractions).  PAST SURGICAL HISTORY: He  has a past surgical history that includes AV fistula placement (Left, 2014); Cardiac catheterization (N/A, 10/03/2014); Cardiac catheterization (Left, 10/03/2014); Cardiac catheterization (Left, 05/01/2015); Cardiac catheterization (N/A, 05/01/2015); A/V Fistulagram (Left, 04/06/2016); A/V SHUNT INTERVENTION (N/A, 04/06/2016); Upper Extremity Angiography (Bilateral, 06/29/2016); UPPER EXTREMITY INTERVENTION (06/29/2016); A/V SHUNT INTERVENTION (N/A, 06/29/2016); DIALYSIS/PERMA CATHETER INSERTION (Right); A/V Fistulagram (Left, 01/19/2017); UPPER EXTREMITY VENOGRAPHY (Left, 03/15/2017); AV fistula placement (Left, 06/01/2017); DIALYSIS/PERMA CATHETER REMOVAL (N/A, 07/05/2017); and Esophagogastroduodenoscopy (egd) with propofol (N/A, 04/20/2018).  Allergies  Allergen Reactions  . Sulfa Antibiotics Rash and Shortness Of Breath    No current facility-administered medications on file prior to encounter.    Current Outpatient Medications on File Prior to Encounter  Medication Sig  . acetaminophen (TYLENOL) 325 MG tablet Take 2 tablets (650 mg total) by mouth every 6 (six) hours as needed for mild pain (or Fever >/= 101). (Patient taking differently: Take 500 mg by mouth every 8 (eight) hours as needed for mild pain (or Fever >/= 101). )  . albuterol (PROVENTIL  HFA;VENTOLIN HFA)  108 (90 Base) MCG/ACT inhaler Inhale 2 puffs into the lungs every 4 (four) hours as needed for wheezing or shortness of breath.   [name] amiodarone (PACERONE) 200 MG tablet Take 1 tablet (200 mg total) by mouth daily.  [name] aspirin EC 81 MG tablet Take 1 tablet (81 mg total) by mouth daily.  [name] atorvastatin (LIPITOR) 40 MG tablet Take 1 tablet (40 mg total) by mouth daily. (Patient taking differently: Take 40 mg by mouth every evening. )  . calcium acetate (PHOSLO) 667 MG capsule Take 1,334-2,001 mg by mouth 3 (three) times daily with meals. take 3 capsules (2014m) by mouth three times daily before each meal, and 2 capsules (6662m by mouth with snacks twice daily  . cinacalcet (SENSIPAR) 90 MG tablet Take 60 mg by mouth daily.   . cyclobenzaprine (FLEXERIL) 5 MG tablet Take 5 mg by mouth daily.  . diphenhydrAMINE (BENADRYL) 25 mg capsule Take 1 capsule (25 mg total) by mouth every 6 (six) hours as needed for itching or allergies.  . Marland Kitchenocusate sodium (COLACE) 100 MG capsule Take 1 capsule (100 mg total) by mouth 2 (two) times daily as needed for mild constipation.  . ferrous sulfate 325 (65 FE) MG EC tablet Take 1 tablet (325 mg total) by mouth 2 (two) times daily.  . Marland Kitchenpratropium (ATROVENT HFA) 17 MCG/ACT inhaler Inhale 2 puffs into the lungs 4 (four) times daily as needed for wheezing.   . lidocaine (LIDODERM) 5 % Place 1 patch onto the skin daily. Remove & Discard patch within 12 hours or as directed by MD  . lidocaine-prilocaine (EMLA) cream Apply 1 application topically as needed (dialysis access).  . multivitamin (RENA-VIT) TABS tablet Take 1 tablet by mouth at bedtime.  . nitroGLYCERIN (NITROSTAT) 0.4 MG SL tablet Place 0.4 mg under the tongue every 5 (five) minutes as needed for chest pain.  . Nutritional Supplements (FEEDING SUPPLEMENT, NEPRO CARB STEADY,) LIQD Take 237 mLs by mouth 2 (two) times daily between meals.  . Marland Kitchenmeprazole (PRILOSEC) 40 MG capsule Take 1 capsule (40 mg total)  by mouth 2 (two) times daily.  . OXYGEN Inhale into the lungs daily as needed. Wears O2 at 2L [place] at home at night and as needed in the day  . traMADol (ULTRAM) 50 MG tablet Take 1 tablet (50 mg total) by mouth every 6 (six) hours as needed for moderate pain or severe pain.    FAMILY HISTORY:  His He indicated that his mother is deceased. He indicated that his father is deceased. He indicated that his sister is alive. He indicated that both of his brothers are alive. He indicated that his son is alive.   SOCIAL HISTORY: He  reports that he quit smoking about 18 months ago. His smoking use included cigarettes. He has a 10.00 pack-year smoking history. He has never used smokeless tobacco. He reports current drug use. Drugs: Cocaine and Marijuana. He reports that he does not drink alcohol.  REVIEW OF SYSTEMS:   Unable to obtain due to intubation and sedation  SUBJECTIVE:  Unable to obtain due to intubation and sedation  VITAL SIGNS: BP 140/74   Pulse (!) 104   Resp 20   Ht _0  (1.727 m)   Wt 74.5 kg   SpO2 98%   BMI 24.97 kg/m   HEMODYNAMICS:    VENTILATOR SETTINGS: Vent Mode: AC FiO2 (%):  [40 %] 40 % Set Rate:  [15 bmp] 15 bmp Vt Set:  [450 mL] 450 mL PEEP:  [  5 cmH20] 5 cmH20  INTAKE / OUTPUT: No intake/output data recorded.  PHYSICAL EXAMINATION: General: Acutely ill-appearing male, laying in bed intubated and sedated, in no acute distress Neuro: Sedated, withdraws from pain, pupils PERRLA 2 mm sluggish bilaterally HEENT: Atraumatic, normocephalic, neck supple, no JVD Cardiovascular: Regular rhythm, tachycardia, S1-S2, no murmurs rubs or gallops Lungs: Clear to auscultation bilaterally, vent assisted, even, nonlabored Abdomen: Soft, nontender, nondistended, no guarding or rebound tenderness, hypoactive bowel sounds Musculoskeletal: Normal bulk and tone, no deformities, no edema Skin: Cool and dry, no obvious rashes lesions or ulcerations  LABS:  BMET Recent Labs   Lab 05/03/18 1747  NA 142  K 4.7  CL 113*  CO2 23  BUN 38*  CREATININE 3.49*  GLUCOSE 98    Electrolytes Recent Labs  Lab 05/03/18 1747  CALCIUM 6.8*    CBC Recent Labs  Lab 05/03/18 1747  WBC 8.8  HGB 5.5*  HCT 18.5*  PLT 183    Coag's No results for input(s): APTT, INR in the last 168 hours.  Sepsis Markers No results for input(s): LATICACIDVEN, PROCALCITON, O2SATVEN in the last 168 hours.  ABG No results for input(s): PHART, PCO2ART, PO2ART in the last 168 hours.  Liver Enzymes Recent Labs  Lab 05/03/18 1747  AST 20  ALT 14  ALKPHOS 61  BILITOT 0.6  ALBUMIN 1.9*    Cardiac Enzymes No results for input(s): TROPONINI, PROBNP in the last 168 hours.  Glucose No results for input(s): GLUCAP in the last 168 hours.  Imaging Dg Chest Portable 1 View  Result Date: 05/03/2018 CLINICAL DATA:  Endotracheal tube placement EXAM: PORTABLE CHEST 1 VIEW COMPARISON:  02/07/2018 FINDINGS: Endotracheal tube tip is at the level of the clavicular heads. Orogastric tube tip and side port project over the gastric fundus. There is mild cardiomegaly. No focal airspace consolidation or pulmonary edema. IMPRESSION: 1. ET tube tip at the level of the clavicular heads. 2. OG tube tip and side port project over the gastric fundus. Electronically Signed   By: Ulyses Jarred M.D.   On: 05/03/2018 20:05     STUDIES:  CT Head 3/11>> Negative  CTA Abdomen & Pelvis 3/11>> Atherosclerotic calcifications of the abdominal aorta without aneurysm or dissection. Patent atherosclerotic branch vessels as above. No evidence of active hemorrhage. No CT findings to explain the patient's hemoptysis nor Hematochezia. Mild soft tissue anasarca. Cardiomegaly with coronary arteriosclerosis. Pulmonary consolidation medially at left base. Atrophic bilateral kidneys. EEG 05/04/18>> EGD 05/04/18>>  CULTURES: Tracheal aspirate 3/12>>  ANTIBIOTICS: Unasyn 3/12>>  SIGNIFICANT EVENTS: 05/03/18>>  Admission to [facility] ICU 05/04/18>> noted hyperkalemia with EKG changes requiring insertion of temporary HD catheter and initiation of CRRT  LINES/TUBES: ETT 3/11>> Left Femoral CVC 3/11>>  Right femoral Trialysis Catheter 3/12>>   ASSESSMENT / PLAN:  PULMONARY A: Intubated for airway protection in setting of lethargy and coffee ground emesis Questionable Aspiration -CTA Abdomen with noted lung consolidation of left base P:   Full vent support, PRVC: 8 cc/kg Wean FiO2 and PEEP as tolerated Follow intermittent ABG and CXR VAP bundle SBT when parameters met Obtain sputum culture Place on empiric Unasyn  CARDIOVASCULAR A:  Hypovolemic shock in setting of GI Bleed +/-septic shock in setting of presumed aspiration P:  Cardiac monitoring Maintain MAP >65 Cautious IV fluids given history of end-stage renal disease and CHF Levophed if needed to maintain MAP >65 Blood transfusions as indicated Hold all home antihypertensives  RENAL A:   ESRD on HD Hyperkalemia P:  Monitor I&O's / urinary output Follow BMP Ensure adequate renal perfusion Avoid nephrotoxic agents as able Replace electrolytes as indicated Received calcium gluconate, insulin, bicarb for hyperkalemia; follow-up BMP pending Nephrology consulted, appreciate input HD vs CRRT per Nephrology  GASTROINTESTINAL A:   Acute GI Bleed P:   NPO Protonix drip  Follow H&H q6h  Transfuse as indicated GI consulted, appreciate input>> Spoke with Dr. Marius Ditch, will obtain STAT CTA Abdomen once pt stabilized; may require Vascular Surgery consult depending on results of CTA; Dr. Marius Ditch suspects this is bleeding from previously noted duodenal ulcer EGD to be performed as per GI  HEMATOLOGIC A:   Acute anemia in setting of GI Bleed P:  Monitor for S/Sx of bleeding Trend CBC SCD's for VTE Prophylaxis (no chemical prophylaxis given GI bleed)  Transfuse for Hgb <8  INFECTIOUS A:   Questionable Aspiration P:   Monitor  fever curve Trend WBC's and procalcitonin Follow sputum culture Placed on empiric Unasyn  ENDOCRINE A:   No acute issues   P:   CBG's Follow ICU Hypo/hyperglylcemia protocol  NEUROLOGIC A:   Questionable seizure activity vs. Myoclonus P:   RASS goal: 0 to -1 Propofol infusion and prn versed and fentanyl pushes to maintain RASS goal Avoid sedating meds as able Provide supportive care Daily Wake up assessment Place on IV Keppra Obtain STAT CT head >> CT head negative Obtain EEG Neurology consulted, appreciate input   FAMILY  - Updates: No family present at bedside 05/03/18.  Pt is a Full Code  - Inter-disciplinary family meet or Palliative Care meeting due by: 05/10/2018    Darel Hong, AGACNP-BC [place] Pager: [phone] Cell: [phone]  05/03/2018, 8:50 PM

## 2018-05-04 NOTE — Progress Notes (Signed)
[facility], [place] 05/04/18  Subjective:   Patient presented to the emergency room via EMS for blood in the stool.  According to triage notes, patient lost about 250 cc of blood in the toilet and while walking throughout the house.  Patient was combative and altered and hypotensive with blood pressure 68/45 in the emergency room.  Per emergency room notes patient had another very large bowel movement with almost 500 cc of bright red blood.  Again he was noted to be lightheaded and dizzy.  His last hemodialysis was yesterday.  4.9 L of fluid was removed He was given 3 units of blood transfusion.  Lab results posttransfusion showed severe hyperkalemia with potassium of 6.9.  A temporary dialysis catheter was placed and urgent CRRT was started.  Patient's daughter is in the room.States he is a chronic alcohol abuser   Objective:  Vital signs in last 24 hours:  Temp:  [95.9 F (35.5 C)-99.2 F (37.3 C)] 98.5 F (36.9 C) (03/12 0846) Pulse Rate:  [70-114] 104 (03/12 1000) Resp:  [17-36] 26 (03/12 1000) BP: (64-177)/(36-157) 134/79 (03/12 1000) SpO2:  [98 %-100 %] 100 % (03/12 1000) FiO2 (%):  [35 %-40 %] 35 % (03/12 0738) Weight:  [71.4 kg-74.5 kg] 71.4 kg (03/11 2100)  Weight change:  Filed Weights   05/03/18 1752 05/03/18 2100  Weight: 74.5 kg 71.4 kg    Intake/Output:    Intake/Output Summary (Last 24 hours) at 05/04/2018 1032 Last data filed at 05/04/2018 1000 Gross per 24 hour  Intake 420 ml  Output 0 ml  Net 420 ml     Physical Exam: General:  Critically ill-appearing  HEENT  ET tube, OG tube  Neck  supple, no masses  Pulm/lungs  ventilator assisted, FiO2 35%  CVS/Heart  tachycardic, irregular  Abdomen:   Soft, nontender  Extremities:  No edema  Neurologic:  Sedated   Skin:  no acute rashes  Access:  Left upper extremity AV fistula, right femoral temporary catheter       Basic Metabolic Panel:  Recent Labs  Lab 05/03/18 1747  05/04/18 0345 05/04/18 0632 05/04/18 0815  NA 142 141 143 140  K 4.7 6.9* 6.0* 5.7*  CL 113* 111 112* 109  CO2 '23 23 23 22  ' GLUCOSE 98 89 60* 77  BUN 38* 59* 65* 61*  CREATININE 3.49* 4.29* 4.37* 4.11*  CALCIUM 6.8* 7.0* 7.4* 7.6*  PHOS  --   --  5.7*  --      CBC: Recent Labs  Lab 05/03/18 1747 05/03/18 2103 05/04/18 0345 05/04/18 0815  WBC 8.8  --  16.3*  --   NEUTROABS 6.1  --   --   --   HGB 5.5* 8.0* 8.0* 9.0*  HCT 18.5* 26.1* 24.4*  --   MCV 100.0  --  91.7  --   PLT 183  --  169  --       Lab Results  Component Value Date   HEPBSAG Negative 04/18/2018   HEPBSAB Reactive 02/15/2017      Microbiology:  Recent Results (from the past 240 hour(s))  MRSA PCR Screening     Status: None   Collection Time: 05/03/18  8:59 PM  Result Value Ref Range Status   MRSA by PCR NEGATIVE NEGATIVE Final    Comment:        The GeneXpert MRSA Assay (FDA approved for NASAL specimens only), is one component of a comprehensive MRSA colonization surveillance program. It is  not intended to diagnose MRSA infection nor to guide or monitor treatment for MRSA infections. Performed at [facility], [address] [place], [place] [address]     Coagulation Studies: No results for input(s): LABPROT, INR in the last 72 hours.  Urinalysis: No results for input(s): COLORURINE, LABSPEC, PHURINE, GLUCOSEU, HGBUR, BILIRUBINUR, KETONESUR, PROTEINUR, UROBILINOGEN, NITRITE, LEUKOCYTESUR in the last 72 hours.  Invalid input(s): APPERANCEUR    Imaging: Ct Head Wo Contrast  Result Date: 05/04/2018 CLINICAL DATA:  63 y/o  M; hematemesis and altered mental status. EXAM: CT HEAD WITHOUT CONTRAST TECHNIQUE: Contiguous axial images were obtained from the base of the skull through the vertex without intravenous contrast. COMPARISON:  04/27/2013 CT head. FINDINGS: Brain: No evidence of acute infarction, hemorrhage, hydrocephalus, extra-axial collection or mass lesion/mass effect.  Stable nonspecific white matter hypodensities compatible chronic microvascular ischemic changes and stable volume loss of the brain. Vascular: Calcific atherosclerosis of carotid siphons and the vertebral arteries. No hyperdense vessel is evident. Skull: Normal. Negative for fracture or focal lesion. Sinuses/Orbits: No acute finding. Other: None. IMPRESSION: 1. No acute intracranial abnormality identified. 2. Stable chronic microvascular ischemic changes and volume loss of the brain. Electronically Signed   By: Kristine Garbe M.D.   On: 05/04/2018 00:52   Dg Chest Portable 1 View  Result Date: 05/03/2018 CLINICAL DATA:  Endotracheal tube placement EXAM: PORTABLE CHEST 1 VIEW COMPARISON:  02/07/2018 FINDINGS: Endotracheal tube tip is at the level of the clavicular heads. Orogastric tube tip and side port project over the gastric fundus. There is mild cardiomegaly. No focal airspace consolidation or pulmonary edema. IMPRESSION: 1. ET tube tip at the level of the clavicular heads. 2. OG tube tip and side port project over the gastric fundus. Electronically Signed   By: Ulyses Jarred M.D.   On: 05/03/2018 20:05   Ct Angio Abd/pel W/ And/or W/o  Result Date: 05/04/2018 CLINICAL DATA:  Altered mental status, gastrointestinal bleeding. Hematemesis and hematochezia with hypotension. Patient underwent hemodialysis today. EXAM: CTA ABDOMEN AND PELVIS WITHOUT AND WITH CONTRAST TECHNIQUE: Multidetector CT imaging of the abdomen and pelvis was performed using the standard protocol during bolus administration of intravenous contrast. Multiplanar reconstructed images and MIPs were obtained and reviewed to evaluate the vascular anatomy. CONTRAST:  188m ISOVUE-370 IOPAMIDOL (ISOVUE-370) INJECTION 76% COMPARISON:  None. FINDINGS: VASCULAR Aorta: Moderate to marked calcific atherosclerosis of the thoracic aorta without aneurysmal the aneurysm or dissection. Celiac: Patent without significant stenosis. Scattered  atherosclerotic calcified plaque noted. SMA: Atherosclerotic without aneurysm or dissection. Renals: Densely calcified at the origins of both renal arteries. No aneurysm. No definite dissection. IMA: Patent Inflow: Atherosclerotic and patent without significant stenosis, aneurysm or dissection. Proximal Outflow: Bilateral common femoral and visualized portions of the superficial and profunda femoral arteries are patent without evidence of aneurysm, dissection, vasculitis or significant stenosis. Moderate atherosclerosis noted bilaterally. Veins: Central line catheter in the left common iliac vein. Review of the MIP images confirms the above findings. NON-VASCULAR Lower chest: Cardiomegaly is noted with coronary arteriosclerosis. No pericardial effusion or thickening. Pulmonary consolidation at the left lung base likely representing atelectatic change. Gastric tube is noted within the esophagus extending into the stomach. Hepatobiliary: Physiologic distention of the gallbladder without stones. No enhancing mass or biliary dilatation of the liver. Pancreas: Normal without ductal dilatation, mass or inflammation. Spleen: Normal size spleen.  No mass. Adrenals/Urinary Tract: Normal bilateral adrenal glands. Atrophic kidneys with renovascular calcifications. No renal mass nor obstructive uropathy. No hydroureteronephrosis. The urinary bladder is  decompressed. Stomach/Bowel: A gastric tube is seen within the physiologically distended stomach. No significant rugal thickening to suggest gastritis. The duodenal bulb, sweep and ligament Treitz are normal. No significant bowel distention or inflammation. No obstruction is noted. The colon is physiologically distended with stool. Mild diffuse mural thickening along the descending and sigmoid colon is likely due to underdistention and less likely inflammatory change. The appendix is nondistended and unremarkable. Lymphatic: No lymphadenopathy by CT size criteria. Reproductive:  Mildly enlarged prostate measuring 4.7 x 4.2 cm on series 6/196. Other: Mild soft tissue anasarca.  No free air nor free fluid. Musculoskeletal: Diffuse sclerosis of the included axial and appendicular skeleton consistent with changes of chronic renal osteodystrophy. Degenerative disc disease of the thoracolumbar spine greatest from L2 through L5. IMPRESSION: VASCULAR 1. Atherosclerotic calcifications of the abdominal aorta without aneurysm or dissection. Patent atherosclerotic branch vessels as above. 2. No evidence of active hemorrhage. NON-VASCULAR 1. No CT findings to explain the patient's hemoptysis nor hematochezia. 2. Mild soft tissue anasarca. 3. Cardiomegaly with coronary arteriosclerosis. Pulmonary consolidation medially at left base. 4. Atrophic bilateral kidneys. Electronically Signed   By: Ashley Royalty M.D.   On: 05/04/2018 01:11     Medications:   . ampicillin-sulbactam (UNASYN) IV    . calcium gluconate    . dextrose    . levETIRAcetam 500 mg (05/04/18 0804)  . norepinephrine (LEVOPHED) Adult infusion 20 mcg/min (05/04/18 0245)  . pantoprozole (PROTONIX) infusion 8 mg/hr (05/04/18 0455)  . propofol (DIPRIVAN) infusion 60 mcg/kg/min (05/04/18 0906)  . pureflow    . tranexamic acid     . sodium chloride   Intravenous Once  . chlorhexidine gluconate (MEDLINE KIT)  15 mL Mouth Rinse BID  . etomidate  25 mg Intravenous Once  . insulin regular  10 Units Intravenous Once  . ipratropium-albuterol  3 mL Nebulization Q4H  . mouth rinse  15 mL Mouth Rinse 10 times per day   acetaminophen **OR** acetaminophen, fentaNYL (SUBLIMAZE) injection, fentaNYL (SUBLIMAZE) injection, heparin, ipratropium-albuterol, midazolam, midazolam, ondansetron **OR** ondansetron (ZOFRAN) IV, polyethylene glycol  Assessment/ Plan:  63 y.o. male withend stage renal disease on hemodialysis, hypertension, hepatitis C, anemia, seizure disorder, COPD/tobacco abuse,substance abuse and alcohol abuse,secondary  hyperparathyroidism, chronic systolic CHF  [facility] [address]Left arm AVG. 195 minutes/73.5 kg  1.  End-stage renal disease with severe hyperkalemia Due to hypotension and requirement for pressors, patient was started on CRRT which he is tolerating well.  UF goal 0.  Currently on 0K bath for severe hyperkalemia  2.  Severe anemia of chronic kidney disease and GI bleed Recent upper endoscopy showed 1 cm ulcer in the duodenal bulb and H. pylori antigen positive.  Patient presented again with hematemesis and hematochezia, hypotension and altered mental status.  GI consulted.  EGD planned once potassium level has improved. S/p 3 multiple units of blood transfusion  3.  Cardiac July 2019 echo moderate to severe mitral regurgitation, calcified mitral valve, 35 to 40%, pulmonary hypertension pressure greater than 65 mmHg Currently hypotensive and requiring pressors  4.  Acute respiratory failure Requiring ventilator support FiO2 35%          LOS: [place] 3/12/202010:32 AM  [place], [place]  Note: This note was prepared with Dragon dictation. Any transcription errors are unintentional

## 2018-05-04 NOTE — Progress Notes (Signed)
Initial Nutrition Assessment  DOCUMENTATION CODES:   Not applicable  INTERVENTION:  Once appropriate for enteral nutrition, recommend initiating Vital High Protein at 50 mL/hr (1200 mL goal daily volume) + Pro-Stat 30 mL once daily per OGT. Provides 1300 kcal, 120 grams of protein, 1008 mL H2O daily. With current propofol rate provides 1891 kcal daily.  If tube feeds are initiated recommend providing minimum free water flush of 20-30 mL Q4hrs to maintain tube patency.  Recommend B-complex with C QHS per tube and vitamin C 500 mg BID per tube.  NUTRITION DIAGNOSIS:   Inadequate oral intake related to inability to eat as evidenced by NPO status.  GOAL:   Provide needs based on ASPEN/SCCM guidelines  MONITOR:   Vent status, Labs, Weight trends, TF tolerance, Skin, I & O's  REASON FOR ASSESSMENT:   Ventilator    ASSESSMENT:   63 year old male with PMHx of PVD, ESRD on HD MWF, pulmonary hypertension, chronic combined systolic and diastolic CHF, arthritis, DM, CAD, HTN, hx MI, hepatitis, EtOH abuse admitted with hematemesis and hematochezia, AMS requiring intubation on 3/11 for airway protection, hypotension.   -Plan is for EGD once potassium <4.  Patient intubated and sedated. On PRVC mode with FiO2 35% and PEEP 5 cmH2O. Patient on CRRT with no UF at time of RD assessment. Abdomen soft. Skin is intact. Unsure of patient's dry weight. Weight slowly trending down in chart. He appears to usually be around 76-78 kg. He is currently 71.4 kg (157.41 lbs).  Enteral Access: OGT placed 3/11; terminates in gastric body per abdominal x-ray 3/12; 65 cm at corner of mouth  MAP: 65-95 mmHg  Patient is currently intubated on ventilator support Ve: 13 L/min Temp (24hrs), Avg:98 F (36.7 C), Min:95.9 F (35.5 C), Max:99.2 F (37.3 C)  Propofol: 22.4 mL/hr (591 kcal daily)  Medications reviewed and include: D10 @ 100 mL/hr, norepinephrine gtt at 10 mcg/min, Protonix gtt, propofol  gtt.  Labs reviewed: CBG 53-156, BUN 54, Creatinine 3.79.  I/O: 0 mL UOP; 0 mL output from OGT  Patient is at risk for malnutrition.  Discussed with RN.  NUTRITION - FOCUSED PHYSICAL EXAM:    Most Recent Value  Orbital Region  No depletion  Upper Arm Region  Mild depletion  Thoracic and Lumbar Region  No depletion  Buccal Region  Unable to assess  Temple Region  Mild depletion  Clavicle Bone Region  Mild depletion  Clavicle and Acromion Bone Region  Mild depletion  Scapular Bone Region  Unable to assess  Dorsal Hand  No depletion  Patellar Region  No depletion  Anterior Thigh Region  No depletion  Posterior Calf Region  Mild depletion  Edema (RD Assessment)  Mild  Hair  Reviewed  Eyes  Unable to assess  Mouth  Unable to assess  Skin  Reviewed  Nails  Reviewed     Diet Order:   Diet Order            Diet NPO time specified  Diet effective now             EDUCATION NEEDS:   No education needs have been identified at this time  Skin:  Skin Assessment: Reviewed RN Assessment  Last BM:  05/03/2018 per chart  Height:   Ht Readings from Last 1 Encounters:  05/03/18 6' (1.829 m)   Weight:   Wt Readings from Last 1 Encounters:  05/03/18 71.4 kg   Ideal Body Weight:  80.9 kg  BMI:  Body mass index is 21.35 kg/m.  Estimated Nutritional Needs:   Kcal:  1905 (PSU 2003b w/ MSJ 1547, Ve 13, Tmax 37.3)  Protein:  120-135 grams (1.7-1.9 grams/kg on CRRT)  Fluid:  UOP + 1 L  Willey Blade, MS, RD, LDN Office: [phone] Pager: [phone] After Hours/Weekend Pager: [phone]

## 2018-05-04 NOTE — Progress Notes (Signed)
Spoke with Dr Candiss Norse regarding concern with patients blood sugars. MD placing orders.

## 2018-05-04 NOTE — Progress Notes (Signed)
Attempted to obtain sputum sample but was unsuccessful at this time.

## 2018-05-04 NOTE — TOC Initial Note (Signed)
Transition of Care [facility]) - Initial/Assessment Note    Patient Details  Name: [name] MRN: [id number] Date of Birth: 1955-10-24  [facility] of [facility]) CM/SW Contact:    Shelbie Hutching, RN Phone Number: 05/04/2018, 4:10 PM  Clinical Narrative:                 Patient admitted with GI bleeding.  Patient is currently in the ICU intubated and sedated.  EGD performed at the bedside today.  No family present at this time.  Patient is a dialysis patient- Elvera Bicker notified of admission.  RNCM will cont to follow.  Expected Discharge Plan: [place]     Patient Goals and CMS Choice        Expected Discharge Plan and Services Expected Discharge Plan: [place]                                Prior Living Arrangements/Services                       Activities of Daily Living      Permission Sought/Granted                  Emotional Assessment Appearance:: Appears stated age Attitude/Demeanor/Rapport: Sedated Affect (typically observed): Unable to Assess   Alcohol / Substance Use: Not Applicable Psych Involvement: No (comment)  Admission diagnosis:  Acute respiratory failure (HCC) [J96.00] Gastrointestinal hemorrhage, unspecified gastrointestinal hemorrhage type [K92.2] Anemia, unspecified type [D64.9] Patient Active Problem List   Diagnosis Date Noted  . GIB (gastrointestinal bleeding) 05/03/2018  . GI bleed 04/18/2018  . Tachycardia 03/13/2017  . Acute respiratory failure with hypoxia ([place]) 02/15/2017  . Chronic systolic heart failure ([place]) 09/08/2016  . 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 Hyperkalemia 07/04/2016  . Abnormal EKG 07/04/2016  . Elevated troponin   . Chest pain 05/22/2016  . Sepsis ([place]) 02/26/2016  . HCAP (healthcare-associated pneumonia) 02/26/2016  . Diabetes ([place]) 02/26/2016  . Community acquired pneumonia 01/07/2016  . Renal dialysis device, implant, or graft complication 02/72/5366   . Tobacco use 09/04/2015  . ESRD (end stage renal disease) on dialysis ([place]) 08/02/2015  . HTN (hypertension) 08/02/2015  . Pulmonary edema 12/02/2014   PCP:  Donnie Coffin, MD Pharmacy:   [facility], [address] [place] [address] Phone: [phone] Fax: [phone]  [facility] (ESRD Bundle Only) - [place], [address] [place] [address] Phone: [phone] Fax: [phone]     Social Determinants of Health (SDOH) Interventions    Readmission Risk Interventions 30 Day Unplanned Readmission Risk Score     ED to Hosp-Admission (Current) from 05/03/2018 in [place]  30 Day Unplanned Readmission Risk Score (%)  32 Filed at 05/04/2018 1600     This score is the patient's risk of an unplanned readmission within 30 days of being discharged (0 -100%). The score is based on dignosis, age, lab data, medications, orders, and past utilization.   Low:  0-14.9   Medium: 15-21.9   High: 22-29.9   Extreme: 30 and above       No flowsheet data found.

## 2018-05-04 NOTE — Anesthesia Preprocedure Evaluation (Deleted)
Anesthesia Evaluation  Patient identified by MRN, date of birth, ID band Patient awake    Reviewed: Allergy & Precautions, NPO status , Patient's Chart, lab work & pertinent test results, reviewed documented beta blocker date and time   Airway Mallampati: II  TM Distance: >3 FB     Dental  (+) Chipped, Loose, Poor Dentition, Missing   Pulmonary shortness of breath, pneumonia, resolved, former smoker,           Cardiovascular hypertension, Pt. on medications and Pt. on home beta blockers + CAD, + Past MI, + Peripheral Vascular Disease and +CHF       Neuro/Psych    GI/Hepatic (+) Hepatitis -  Endo/Other  diabetes, Type 2  Renal/GU Renal disease     Musculoskeletal  (+) Arthritis ,   Abdominal   Peds  Hematology  (+) anemia ,   Anesthesia Other Findings PVCs. EF 30-40.  Reproductive/Obstetrics                             Anesthesia Physical  Anesthesia Plan  ASA: III  Anesthesia Plan: General   Post-op Pain Management:    Induction: Intravenous  PONV Risk Score and Plan: TIVA  Airway Management Planned: Nasal Cannula  Additional Equipment:   Intra-op Plan:   Post-operative Plan:   Informed Consent: I have reviewed the patients History and Physical, chart, labs and discussed the procedure including the risks, benefits and alternatives for the proposed anesthesia with the patient or authorized representative who has indicated his/her understanding and acceptance.       Plan Discussed with: CRNA  Anesthesia Plan Comments:         Anesthesia Quick Evaluation

## 2018-05-04 NOTE — Progress Notes (Signed)
Pt's Potassium this am is 6.9.  His cardiac rhythm has changed from prior, now it is much more irregular.  Have ordered for Ca gluconate, 10 units insulin, and Bicarb.  Pt does receive hemodialysis, however pt currently requiring Levophed infusion to maintain BP.  Likely he will not be able to tolerate HD.  Given pt's significantly elevated K and change in cardiac rhythm, called and spoke with Dr. Candiss Norse of Nephrology to inquire about possible CRRT.  Per Dr. Candiss Norse, pt should be placed on CRRT, and will need placement of temporary HD catheter.  Will place temporary HD catheter.   Darel Hong, AGACNP-BC [facility] Pager: [phone] Cell: [phone]

## 2018-05-04 NOTE — Consult Note (Signed)
Cephas Darby, MD [address]  [place]  [place], [place] [address]  Main: [phone]  Fax: [phone] Pager: [phone]   Consultation  Referring Provider:     No ref. provider found Primary Care Physician:  Donnie Coffin, MD Primary Gastroenterologist:None         Reason for Consultation:     GI bleed  Date of Admission:  05/03/2018 Date of Consultation:  05/04/2018         HPI:   [name] is a 63 y.o. male regency renal disease on hemodialysis, coronary artery disease, congestive heart failure, EF 35%, history of diabetes, peripheral vascular disease who was recently admitted to [facility] 2 weeks ago due to upper gi bleed, egd by Dr Vicente Males on 2/27, revealed clean based 1cm ulcer in the duodenal bulb and H Pylori Ag positive.  He presented to ER yesterday due to hematemesis and hematochezia, hypotensive and AMS. Underwent HD yesterday. He was intubated in ER for airway protection due to hematemesis and AMS, started on PPI drip and received 4 units of PRBCs and admitted to ICU. Hb was 5.5 prior to resuscitation, dropped from 9.1 since last admission.   Hemoglobin improved to 8 after resuscitation  Upon arrival to the ICU, he was borderline hypotensive, had another episode of dark red bowel movement, started on Levophed.  He underwent CT angiogram which did not reveal active bleeding.  His potassium was found to be 6.9 with rhythm changes, repeat hemoglobin 8.  Central line was placed and dialysis access was secured to start CRRT.  He was medically treated for hyperkalemia, improved to 6 this morning.  Patient did not have further episodes of bleeding or blood from the OG tube since last night.  Levophed has been maintained between 15 and 23mg   NSAIDs: None  Antiplts/Anticoagulants/Anti thrombotics: Aspirin 81  GI Procedures: He had several EGDs in the past for coffee-ground emesis and iron deficiency anemia Had colonoscopy in 2016, found to have adenomatous  polyps  Past Medical History:  Diagnosis Date   Arthritis    CAD (coronary artery disease)    a. 09/2013 Myoview ([facility]: basal inf defect more pronounced @ rest, likely artifact, EF 48%, low risk study; b. 06/2016 Cath ([facility]: LAD 10p, 30-464m40-50d, D1 90(small), D2 40, D3 60-70, D4 30, LCX 10p, 3070m0d, Om1 30, OM3 40, RCA 100p w/ bridging collats, m/d RCA fill via collats, small/mod caliber-->Med Rx; c. 02/2017 MV: inf infarct w/ peri-inf ischemia. EF <30%.    Chronic combined systolic and diastolic CHF (congestive heart failure) ([place]  [facility];  b. 10/2014 Echo: EF 30-35%, diff HK, gr1 DD;  c. 04/2016 Echo: EF 20-25%, sev dil LV, diff HK, gr3 DD; d. 06/2016 Echo: EF 30-35%, Gr2 DD.   Diabetes ([place]  Dyspnea    ESRD (end stage renal disease) ([place]  a. [facility]    Patient is unsure type of Hepatitis he has   Hypertension    Hypertensive heart disease with CHF (congestive heart failure) ([facility]  Mixed Ischemic and non-ischemic cardiomyopathy    [facility];  b. 10/2014 Echo: EF 30-35%, diff HK, gr1 DD;  c. 04/2016 Echo: EF 20-25%, sev dil LV, diff HK, gr3 DD; d. 06/2016 Echo: EF 30-35%, Gr2 DD.   Moderate Aortic insufficiency    a. 04/2016  Echo: Mod AI.   Myocardial infarction [facility])    Peripheral vascular disease ([place])    Pulmonary hypertension ([place])    a. 10/2014 Echo: Mod-Sev PAH.   PVC's (premature ventricular contractions)    a. 02/2017 24 hr holter: freq polymorphic PVCs w/ total of 38K beats in 24 hrs (34% burden).    Past Surgical History:  Procedure Laterality Date   A/V FISTULAGRAM Left 04/06/2016   Procedure: A/V Fistulagram;  Surgeon: Katha Cabal, MD;  Location: [place] [facility];  Service: Cardiovascular;  Laterality: Left;   A/V FISTULAGRAM Left 01/19/2017   Procedure: A/V FISTULAGRAM;  Surgeon: Katha Cabal, MD;  Location: [place] [facility]  LAB;  Service: Cardiovascular;  Laterality: Left;   A/V SHUNT INTERVENTION N/A 04/06/2016   Procedure: A/V Shunt Intervention;  Surgeon: Katha Cabal, MD;  Location: [place] [facility];  Service: Cardiovascular;  Laterality: N/A;   A/V SHUNT INTERVENTION N/A 06/29/2016   Procedure: A/V Shunt Intervention;  Surgeon: Katha Cabal, MD;  Location: [place] [facility];  Service: Cardiovascular;  Laterality: N/A;   AV FISTULA PLACEMENT Left 2014   AV FISTULA PLACEMENT Left 06/01/2017   Procedure: INSERTION OF ARTERIOVENOUS (AV) GORE-TEX GRAFT ARM ( BRACHIAL AXILLARY );  Surgeon: Katha Cabal, MD;  Location: [facility] ORS;  Service: Vascular;  Laterality: Left;   DIALYSIS/PERMA CATHETER INSERTION Right    Dr. Delana Meyer   DIALYSIS/PERMA CATHETER REMOVAL N/A 07/05/2017   Procedure: DIALYSIS/PERMA CATHETER REMOVAL;  Surgeon: Katha Cabal, MD;  Location: [place] [facility];  Service: Cardiovascular;  Laterality: N/A;   ESOPHAGOGASTRODUODENOSCOPY (EGD) WITH PROPOFOL N/A 04/20/2018   Procedure: ESOPHAGOGASTRODUODENOSCOPY (EGD) WITH PROPOFOL;  Surgeon: Jonathon Bellows, MD;  Location: [facility] ENDOSCOPY;  Service: Gastroenterology;  Laterality: N/A;   PERIPHERAL VASCULAR CATHETERIZATION N/A 10/03/2014   Procedure: A/V Shuntogram/Fistulagram;  Surgeon: Algernon Huxley, MD;  Location: [place] [facility];  Service: Cardiovascular;  Laterality: N/A;   PERIPHERAL VASCULAR CATHETERIZATION Left 10/03/2014   Procedure: A/V Shunt Intervention;  Surgeon: Algernon Huxley, MD;  Location: [place] [facility];  Service: Cardiovascular;  Laterality: Left;   PERIPHERAL VASCULAR CATHETERIZATION Left 05/01/2015   Procedure: A/V Shuntogram/Fistulagram;  Surgeon: Algernon Huxley, MD;  Location: [place] [facility] LAB;  Service: Cardiovascular;  Laterality: Left;   PERIPHERAL VASCULAR CATHETERIZATION N/A 05/01/2015   Procedure: A/V Shunt Intervention;  Surgeon: Algernon Huxley, MD;  Location: [place] [facility] LAB;  Service:  Cardiovascular;  Laterality: N/A;   UPPER EXTREMITY ANGIOGRAPHY Bilateral 06/29/2016   Procedure: Upper Extremity Angiography;  Surgeon: Katha Cabal, MD;  Location: [place] [facility];  Service: Cardiovascular;  Laterality: Bilateral;   UPPER EXTREMITY INTERVENTION  06/29/2016   Procedure: Upper Extremity Intervention;  Surgeon: Katha Cabal, MD;  Location: [place] [facility];  Service: Cardiovascular;;   UPPER EXTREMITY VENOGRAPHY Left 03/15/2017   Procedure: UPPER EXTREMITY VENOGRAPHY;  Surgeon: Katha Cabal, MD;  Location: [place] [facility];  Service: Cardiovascular;  Laterality: Left;    Prior to Admission medications   Medication Sig Start Date End Date Taking? Authorizing Provider  acetaminophen (TYLENOL) 325 MG tablet Take 2 tablets (650 mg total) by mouth every 6 (six) hours as needed for mild pain (or Fever >/= 101). Patient taking differently: Take 500 mg by mouth every 8 (eight) hours as needed for mild pain (or Fever >/= 101).  09/21/16   Gouru, Illene Silver, MD  albuterol (PROVENTIL HFA;VENTOLIN HFA) 108 (90 Base) MCG/ACT inhaler Inhale  2 puffs into the lungs every 4 (four) hours as needed for wheezing or shortness of breath.     [provider]  amiodarone (PACERONE) 200 MG tablet Take 1 tablet (200 mg total) by mouth daily. 04/06/17   Theora Gianotti, NP  aspirin EC 81 MG tablet Take 1 tablet (81 mg total) by mouth daily. 04/21/18 04/21/19  Nicholes Mango, MD  atorvastatin (LIPITOR) 40 MG tablet Take 1 tablet (40 mg total) by mouth daily. Patient taking differently: Take 40 mg by mouth every evening.  02/19/17   Demetrios Loll, MD  calcium acetate (PHOSLO) 667 MG capsule Take 1,334-2,001 mg by mouth 3 (three) times daily with meals. take 3 capsules (2031m) by mouth three times daily before each meal, and 2 capsules (6639m by mouth with snacks twice daily 12/11/15   [provider]  cinacalcet (SENSIPAR) 90 MG tablet Take 60 mg by mouth daily.      [provider]  cyclobenzaprine (FLEXERIL) 5 MG tablet Take 5 mg by mouth daily. 03/17/18   [provider]  diphenhydrAMINE (BENADRYL) 25 mg capsule Take 1 capsule (25 mg total) by mouth every 6 (six) hours as needed for itching or allergies. 04/21/18   GoNicholes MangoMD  docusate sodium (COLACE) 100 MG capsule Take 1 capsule (100 mg total) by mouth 2 (two) times daily as needed for mild constipation. 04/21/18   GoNicholes MangoMD  ferrous sulfate 325 (65 FE) MG EC tablet Take 1 tablet (325 mg total) by mouth 2 (two) times daily. 04/21/18 04/21/19  GoNicholes MangoMD  ipratropium (ATROVENT HFA) 17 MCG/ACT inhaler Inhale 2 puffs into the lungs 4 (four) times daily as needed for wheezing.     [provider]  lidocaine (LIDODERM) 5 % Place 1 patch onto the skin daily. Remove & Discard patch within 12 hours or as directed by MD 04/21/18   GoNicholes MangoMD  lidocaine-prilocaine (EMLA) cream Apply 1 application topically as needed (dialysis access).    [provider]  multivitamin (RENA-VIT) TABS tablet Take 1 tablet by mouth at bedtime. 04/21/18   GoNicholes MangoMD  nitroGLYCERIN (NITROSTAT) 0.4 MG SL tablet Place 0.4 mg under the tongue every 5 (five) minutes as needed for chest pain.    [provider]  Nutritional Supplements (FEEDING SUPPLEMENT, NEPRO CARB STEADY,) LIQD Take 237 mLs by mouth 2 (two) times daily between meals. 04/21/18   GoNicholes MangoMD  omeprazole (PRILOSEC) 40 MG capsule Take 1 capsule (40 mg total) by mouth 2 (two) times daily. 04/21/18   GoNicholes MangoMD  OXYGEN Inhale into the lungs daily as needed. Wears O2 at 2L [place] at home at night and as needed in the day    [provider]  traMADol (ULTRAM) 50 MG tablet Take 1 tablet (50 mg total) by mouth every 6 (six) hours as needed for moderate pain or severe pain. 04/21/18   GoNicholes MangoMD   Current Facility-Administered Medications:    0.9 %  sodium chloride infusion (Manually program via  Guardrails IV Fluids), , Intravenous, Once, KeBradly BienenstockNP   acetaminophen (TYLENOL) tablet 650 mg, 650 mg, Oral, Q6H PRN **OR** acetaminophen (TYLENOL) suppository 650 mg, 650 mg, Rectal, Q6H PRN, Mody, Sital, MD   Ampicillin-Sulbactam (UNASYN) 3 g in sodium chloride 0.9 % 100 mL IVPB, 3 g, Intravenous, Q12H, KeDarel Hong, NP   calcium gluconate 1 g/ 50 mL sodium chloride IVPB, 1 g, Intravenous, Once, KeBradly BienenstockNP  chlorhexidine gluconate (MEDLINE KIT) (PERIDEX) 0.12 % solution 15 mL, 15 mL, Mouth Rinse, BID, Darel Hong D, NP   etomidate (AMIDATE) injection 25 mg, 25 mg, Intravenous, Once, Harvest Dark, MD   fentaNYL (SUBLIMAZE) injection 100 mcg, 100 mcg, Intravenous, Q15 min PRN, Darel Hong D, NP, 100 mcg at 05/03/18 2346   fentaNYL (SUBLIMAZE) injection 100 mcg, 100 mcg, Intravenous, Q2H PRN, Darel Hong D, NP   heparin injection 1,000-6,000 Units, 1,000-6,000 Units, Intracatheter, PRN, Murlean Iba, MD   insulin regular (NOVOLIN R,HUMULIN R) 100 units/mL injection 10 Units, 10 Units, Intravenous, Once, Darel Hong D, NP   ipratropium-albuterol (DUONEB) 0.5-2.5 (3) MG/3ML nebulizer solution 3 mL, 3 mL, Nebulization, Q4H, Darel Hong D, NP, 3 mL at 05/04/18 0743   ipratropium-albuterol (DUONEB) 0.5-2.5 (3) MG/3ML nebulizer solution 3 mL, 3 mL, Nebulization, Q4H PRN, Bradly Bienenstock, NP   levETIRAcetam (KEPPRA) IVPB 500 mg/100 mL premix, 500 mg, Intravenous, Q12H, Darel Hong D, NP, Last Rate: 400 mL/hr at 05/03/18 2351, 500 mg at 05/03/18 2351   MEDLINE mouth rinse, 15 mL, Mouth Rinse, 10 times per day, Bradly Bienenstock, NP   midazolam (VERSED) injection 2 mg, 2 mg, Intravenous, Q15 min PRN, Bradly Bienenstock, NP   midazolam (VERSED) injection 2 mg, 2 mg, Intravenous, Q2H PRN, Darel Hong D, NP, 2 mg at 05/04/18 0505   norepinephrine (LEVOPHED) 16 mg in 210m premix infusion, 0-40 mcg/min, Intravenous, Titrated, KDarel HongD, NP, Last Rate: 18.75 mL/hr at 05/04/18 0245, 20 mcg/min at 05/04/18 0245   ondansetron (ZOFRAN) tablet 4 mg, 4 mg, Oral, Q6H PRN **OR** ondansetron (ZOFRAN) injection 4 mg, 4 mg, Intravenous, Q6H PRN, Mody, Sital, MD   pantoprazole (PROTONIX) 80 mg in sodium chloride 0.9 % 250 mL (0.32 mg/mL) infusion, 8 mg/hr, Intravenous, Continuous, Mody, Sital, MD, Last Rate: 25 mL/hr at 05/04/18 0455, 8 mg/hr at 05/04/18 0455   polyethylene glycol (MIRALAX / GLYCOLAX) packet 17 g, 17 g, Oral, Daily PRN, Mody, Sital, MD   propofol (DIPRIVAN) 1000 MG/100ML infusion, 5-80 mcg/kg/min, Intravenous, Continuous, Mody, Sital, MD, Last Rate: 22.4 mL/hr at 05/04/18 0134, 50 mcg/kg/min at 05/04/18 0134   pureflow IV solution for Dialysis, , CRRT, Continuous, Singh, Harmeet, MD   tranexamic acid (CYKLOKAPRON) IVPB 1,000 mg, 1,000 mg, Intravenous, To OR, PHarvest Dark MD   Family History  Problem Relation Age of Onset   Hypertension Son    Diabetes Son    Hypertension Mother    Diabetes Mother    Diabetes Sister      Social History   Tobacco Use   Smoking status: Former Smoker    Packs/day: 0.25    Years: 40.00    Pack years: 10.00    Types: Cigarettes    Last attempt to quit: 10/12/2016    Years since quitting: 1.5   Smokeless tobacco: Never Used  Substance Use Topics   Alcohol use: No    Alcohol/week: 0.0 standard drinks   Drug use: Yes    Types: Cocaine, Marijuana    Comment: patient states no drugs    Allergies as of 05/03/2018 - Review Complete 05/03/2018  Allergen Reaction Noted   Sulfa antibiotics Rash and Shortness Of Breath 05/17/2013    Review of Systems:    All systems reviewed and negative except where noted in HPI.   Physical Exam:  Vital signs in last 24 hours: Temp:  [95.9 F (35.5 C)-99.2 F (37.3 C)] 98.5 F (36.9 C) (03/12 0625) Pulse Rate:  [70-114] 107 (  03/12 0600) Resp:  [17-36] 24 (03/12 0600) BP: (64-152)/(36-80) 111/66 (03/12  0625) SpO2:  [98 %-100 %] 100 % (03/12 0738) FiO2 (%):  [35 %-40 %] 35 % (03/12 0738) Weight:  [71.4 kg-74.5 kg] 71.4 kg (03/11 2100) Last BM Date: 05/03/18 General: Intubated and sedated, NAD Head:  Normocephalic and atraumatic. Eyes:   No icterus.   Conjunctiva pink. PERRLA. Ears:  Normal auditory acuity. Neck:  Supple; no masses or thyroidomegaly Lungs: Respirations even and unlabored. Lungs clear to auscultation bilaterally.   No wheezes, crackles, or rhonchi.  Heart:  Regular rate and rhythm, tachycardia;  Without murmur, clicks, rubs or gallops Abdomen:  Soft, nondistended, nontender. Normal bowel sounds. No appreciable masses or hepatomegaly.  No rebound or guarding.  Rectal:  Not performed. Extremities:  Without edema, cyanosis or clubbing. Neurologic: Intubated and sedated Skin:  Intact without significant lesions or rashes.  LAB RESULTS: CBC Latest Ref Rng & Units 05/04/2018 05/03/2018 05/03/2018  WBC 4.0 - 10.5 K/uL 16.3(H) - 8.8  Hemoglobin 13.0 - 17.0 g/dL 8.0(L) 8.0(L) 5.5(L)  Hematocrit 39.0 - 52.0 % 24.4(L) 26.1(L) 18.5(L)  Platelets 150 - 400 K/uL 169 - 183    BMET BMP Latest Ref Rng & Units 05/04/2018 05/04/2018 05/03/2018  Glucose 70 - 99 mg/dL 60(L) 89 98  BUN 8 - 23 mg/dL 65(H) 59(H) 38(H)  Creatinine 0.61 - 1.24 mg/dL 4.37(H) 4.29(H) 3.49(H)  Sodium 135 - 145 mmol/L 143 141 142  Potassium 3.5 - 5.1 mmol/L 6.0(H) 6.9(HH) 4.7  Chloride 98 - 111 mmol/L 112(H) 111 113(H)  CO2 22 - 32 mmol/L _0 Calcium 8.9 - 10.3 mg/dL 7.4(L) 7.0(L) 6.8(L)    LFT Hepatic Function Latest Ref Rng & Units 05/04/2018 05/03/2018 04/17/2018  Total Protein 6.5 - 8.1 g/dL - 4.4(L) 5.9(L)  Albumin 3.5 - 5.0 g/dL 2.1(L) 1.9(L) 2.8(L)  AST 15 - 41 U/L - 20 25  ALT 0 - 44 U/L - 14 18  Alk Phosphatase 38 - 126 U/L - 61 80  Total Bilirubin 0.3 - 1.2 mg/dL - 0.6 1.2     STUDIES: Ct Head Wo Contrast  Result Date: 05/04/2018 CLINICAL DATA:  63 y/o  M; hematemesis and altered mental  status. EXAM: CT HEAD WITHOUT CONTRAST TECHNIQUE: Contiguous axial images were obtained from the base of the skull through the vertex without intravenous contrast. COMPARISON:  04/27/2013 CT head. FINDINGS: Brain: No evidence of acute infarction, hemorrhage, hydrocephalus, extra-axial collection or mass lesion/mass effect. Stable nonspecific white matter hypodensities compatible chronic microvascular ischemic changes and stable volume loss of the brain. Vascular: Calcific atherosclerosis of carotid siphons and the vertebral arteries. No hyperdense vessel is evident. Skull: Normal. Negative for fracture or focal lesion. Sinuses/Orbits: No acute finding. Other: None. IMPRESSION: 1. No acute intracranial abnormality identified. 2. Stable chronic microvascular ischemic changes and volume loss of the brain. Electronically Signed   By: Kristine Garbe M.D.   On: 05/04/2018 00:52   Dg Chest Portable 1 View  Result Date: 05/03/2018 CLINICAL DATA:  Endotracheal tube placement EXAM: PORTABLE CHEST 1 VIEW COMPARISON:  02/07/2018 FINDINGS: Endotracheal tube tip is at the level of the clavicular heads. Orogastric tube tip and side port project over the gastric fundus. There is mild cardiomegaly. No focal airspace consolidation or pulmonary edema. IMPRESSION: 1. ET tube tip at the level of the clavicular heads. 2. OG tube tip and side port project over the gastric fundus. Electronically Signed   By: Ulyses Jarred M.D.   On: 05/03/2018  20:05   Ct Angio Abd/pel W/ And/or W/o  Result Date: 05/04/2018 CLINICAL DATA:  Altered mental status, gastrointestinal bleeding. Hematemesis and hematochezia with hypotension. Patient underwent hemodialysis today. EXAM: CTA ABDOMEN AND PELVIS WITHOUT AND WITH CONTRAST TECHNIQUE: Multidetector CT imaging of the abdomen and pelvis was performed using the standard protocol during bolus administration of intravenous contrast. Multiplanar reconstructed images and MIPs were obtained and  reviewed to evaluate the vascular anatomy. CONTRAST:  137m ISOVUE-370 IOPAMIDOL (ISOVUE-370) INJECTION 76% COMPARISON:  None. FINDINGS: VASCULAR Aorta: Moderate to marked calcific atherosclerosis of the thoracic aorta without aneurysmal the aneurysm or dissection. Celiac: Patent without significant stenosis. Scattered atherosclerotic calcified plaque noted. SMA: Atherosclerotic without aneurysm or dissection. Renals: Densely calcified at the origins of both renal arteries. No aneurysm. No definite dissection. IMA: Patent Inflow: Atherosclerotic and patent without significant stenosis, aneurysm or dissection. Proximal Outflow: Bilateral common femoral and visualized portions of the superficial and profunda femoral arteries are patent without evidence of aneurysm, dissection, vasculitis or significant stenosis. Moderate atherosclerosis noted bilaterally. Veins: Central line catheter in the left common iliac vein. Review of the MIP images confirms the above findings. NON-VASCULAR Lower chest: Cardiomegaly is noted with coronary arteriosclerosis. No pericardial effusion or thickening. Pulmonary consolidation at the left lung base likely representing atelectatic change. Gastric tube is noted within the esophagus extending into the stomach. Hepatobiliary: Physiologic distention of the gallbladder without stones. No enhancing mass or biliary dilatation of the liver. Pancreas: Normal without ductal dilatation, mass or inflammation. Spleen: Normal size spleen.  No mass. Adrenals/Urinary Tract: Normal bilateral adrenal glands. Atrophic kidneys with renovascular calcifications. No renal mass nor obstructive uropathy. No hydroureteronephrosis. The urinary bladder is decompressed. Stomach/Bowel: A gastric tube is seen within the physiologically distended stomach. No significant rugal thickening to suggest gastritis. The duodenal bulb, sweep and ligament Treitz are normal. No significant bowel distention or inflammation. No  obstruction is noted. The colon is physiologically distended with stool. Mild diffuse mural thickening along the descending and sigmoid colon is likely due to underdistention and less likely inflammatory change. The appendix is nondistended and unremarkable. Lymphatic: No lymphadenopathy by CT size criteria. Reproductive: Mildly enlarged prostate measuring 4.7 x 4.2 cm on series 6/196. Other: Mild soft tissue anasarca.  No free air nor free fluid. Musculoskeletal: Diffuse sclerosis of the included axial and appendicular skeleton consistent with changes of chronic renal osteodystrophy. Degenerative disc disease of the thoracolumbar spine greatest from L2 through L5. IMPRESSION: VASCULAR 1. Atherosclerotic calcifications of the abdominal aorta without aneurysm or dissection. Patent atherosclerotic branch vessels as above. 2. No evidence of active hemorrhage. NON-VASCULAR 1. No CT findings to explain the patient's hemoptysis nor hematochezia. 2. Mild soft tissue anasarca. 3. Cardiomegaly with coronary arteriosclerosis. Pulmonary consolidation medially at left base. 4. Atrophic bilateral kidneys. Electronically Signed   By: DAshley RoyaltyM.D.   On: 05/04/2018 01:11      Impression / Plan:   [name] a 63y.o. African-American male with end-stage renal disease, coronary artery disease, cardiomyopathy, EF 35%, diabetes, peripheral vascular disease.  Recent GI bleed from duodenal ulcer presented with recurrence of upper GI bleed probably from duodenal ulcer. CT Angio did not reveal active bleeding  Original plan was to perform EGD early this morning.  However, due to significant hyperkalemia and rhythm changes, patient will be started on CRRT.  Will perform EGD as soon as his potassium is <4 Continue pantoprazole drip Monitor CBC closely, goal Hb > 8 Started on Unasyn due to new onset  of leukocytosis, likely secondary to aspiration Patient will be receiving 1 unit of PRBCs and FFP this morning Patient is  critically ill given his underlying multiple medical conditions in setting of recurrent GI bleed Discussed my plan with the ICU NP, Darlyn Chamber  Thank you for involving me in the care of this patient.  Will continue to monitor the patient closely    LOS: 1 day   Sherri Sear, MD  05/04/2018, 7:51 AM   Note: This dictation was prepared with Dragon dictation along with smaller phrase technology. Any transcriptional errors that result from this process are unintentional.

## 2018-05-04 NOTE — Progress Notes (Signed)
Called and spoke with pt's son Thelton Graca to obtain consent for CRRT.  Updated Devin on his father's course up to this point, and the need for CRRT given his K and inability to tolerate HD.  Devin gives consent for CRRT, temporary HD catheter, and any blood products that his father may need.   Darel Hong, AGACNP-BC [facility] Pager: [phone] Cell: [phone]

## 2018-05-04 NOTE — Progress Notes (Signed)
BG 53. Per Dr Candiss Norse give an amp of dextrose and increased D10 drip to 100 ml/hr.

## 2018-05-04 NOTE — Op Note (Signed)
[facility] Patient Name: [name] Procedure Date: 05/04/2018 3:17 PM MRN: [id number] Account #: [id number] Date of Birth: 03/19/1955 Admit Type: Inpatient Age: 63 Room: [facility] ENDO ROOM 3 Gender: Male Note Status: Finalized Procedure:            Upper GI endoscopy Indications:          Hematemesis Providers:            Varnita B. Bonna Gains MD, MD Medicines:            See ICU documentation. Pt on propofol drip per ICU Complications:        No immediate complications. Procedure:            Pre-Anesthesia Assessment:                       - Prior to the procedure, a History and Physical was                        performed, and patient medications and allergies were                        reviewed. The patient is competent. The risks and                        benefits of the procedure and the sedation options and                        risks were discussed with the patient. All questions                        were answered and informed consent was obtained.                        Patient identification and proposed procedure were                        verified by the physician, the nurse and the technician                        in the pre-procedure area in the procedure room in the                        endoscopy suite. Mental Status Examination: sedated.                        Airway Examination: normal oropharyngeal airway and                        neck mobility. Respiratory Examination: clear to                        auscultation. CV Examination: normal. Prophylactic                        Antibiotics: The patient does not require prophylactic                        antibiotics. Prior Anticoagulants: The patient has  taken no previous anticoagulant or antiplatelet agents.                        ASA Grade Assessment: III - A patient with severe                        systemic disease. After reviewing the risks and                         benefits, the patient was deemed in satisfactory                        condition to undergo the procedure. The anesthesia plan                        was to use deep sedation / analgesia. Immediately prior                        to administration of medications, the patient was                        re-assessed for adequacy to receive sedatives. The                        heart rate, respiratory rate, oxygen saturations, blood                        pressure, adequacy of pulmonary ventilation, and                        response to care were monitored throughout the                        procedure. The physical status of the patient was                        re-assessed after the procedure.                       After obtaining informed consent, the endoscope was                        passed under direct vision. Throughout the procedure,                        the patient's blood pressure, pulse, and oxygen                        saturations were monitored continuously. The Endoscope                        was introduced through the mouth, and advanced to the                        second part of duodenum. The upper GI endoscopy was                        accomplished with ease. The patient tolerated the  procedure well. Findings:      The examined esophagus was normal.      A single 5 mm sessile polyp with no bleeding and no stigmata of recent       bleeding was found in the gastric body. Biopsies not done due to recent       GI bleeding.      The exam of the stomach was otherwise normal.      NG tube was seen in the stomach and was left in place before and after       the procedure.      One non-bleeding duodenal ulcer with a visible vessel was found in the       duodenal bulb. The lesion was 5 mm in largest dimension. Coagulation for       hemostasis using bipolar probe was successful. For location marking, one       hemostatic clip  was successfully placed. There was no bleeding at the       end of the procedure. The clip could not be placed at the ulcer site       itself due to the angulated position of the ulcer in the duodenal bulb.       The clip was placed adjacent to the ulcer for marking purposes only.       This was the same ulcer seen on previous EGD in February by Dr. Vicente Males.      The second portion of the duodenum was normal. Impression:           - Normal esophagus.                       - A single gastric polyp.                       - One non-bleeding duodenal ulcer with a visible                        vessel. Treated with bipolar cautery. Clip was placed.                       - Normal second portion of the duodenum.                       - No specimens collected. Recommendation:       - Continue Serial CBCs and transfuse PRN                       - Use Protonix (pantoprazole) 40 mg IV daily.                       - Avoid NSAIDs except Aspirin if medically indicated by                        PCP                       - If no further signs of bleeding, NG tube can be                        discontinued tomorrow. It was left in place in case pt  has recurrent active bleeding.                       - Call Vascular surgery for embolization if pt has                        signs of recurrent GI bleeding.                       - Pt will be followed by Dr. Marius Ditch as an inpatient                       - Return to GI clinic in 2 weeks. Procedure Code(s):    --- Professional ---                       [phone], Esophagogastroduodenoscopy, flexible, transoral;                        with control of bleeding, any method                       44799, Unlisted procedure, small intestine Diagnosis Code(s):    --- Professional ---                       K31.7, Polyp of stomach and duodenum                       K26.4, Chronic or unspecified duodenal ulcer with                        hemorrhage                        K92.0, Hematemesis CPT copyright 2018 [employer]. All rights reserved. The codes documented in this report are preliminary and upon coder review may  be revised to meet current compliance requirements.  Vonda Antigua, MD Margretta Sidle B. Bonna Gains MD, MD 05/04/2018 4:37:42 PM This report has been signed electronically. Number of Addenda: 0 Note Initiated On: 05/04/2018 3:17 PM Estimated Blood Loss: Estimated blood loss: none.      [facility]

## 2018-05-04 NOTE — Progress Notes (Signed)
Pharmacy Antibiotic Note  [name] is a 63 y.o. male admitted on 05/03/2018 with aspiration pneumonia.  Pharmacy has been consulted for Unasyn dosing.  Plan: Will start Unasyn 3g IV q12h per CrCl 15 - 29 ml/min  Height: 6' (182.9 cm) Weight: 157 lb 6.5 oz (71.4 kg) IBW/kg (Calculated) : 77.6  Temp (24hrs), Avg:97.1 F (36.2 C), Min:95.9 F (35.5 C), Max:98.4 F (36.9 C)  Recent Labs  Lab 05/03/18 1747 05/04/18 0345  WBC 8.8 16.3*  CREATININE 3.49* 4.29*    Estimated Creatinine Clearance: 17.6 mL/min (A) (by C-G formula based on SCr of 4.29 mg/dL (H)).    Allergies  Allergen Reactions  . Sulfa Antibiotics Rash and Shortness Of Breath    Thank you for allowing pharmacy to be a part of this patient's care.  Tobie Lords, PharmD, BCPS Clinical Pharmacist 05/04/2018

## 2018-05-04 NOTE — Progress Notes (Signed)
[facility] NAME: [name]    MR#:  [id number]  DATE OF BIRTH:  August 27, 1955  SUBJECTIVE:  CHIEF COMPLAINT:   Chief Complaint  Patient presents with  . Rectal Bleeding  Patient seen and evaluated today Currently on ventilator Tidal volume 550 Rate 15 PEEP 5 FiO2 35%  REVIEW OF SYSTEMS:    ROS On ventilator unable to give any history  DRUG ALLERGIES:   Allergies  Allergen Reactions  . Sulfa Antibiotics Rash and Shortness Of Breath    VITALS:  Blood pressure 126/74, pulse (!) 106, temperature 98.5 F (36.9 C), temperature source Oral, resp. rate (!) 25, height 6' (1.829 m), weight 71.4 kg, SpO2 100 %.  PHYSICAL EXAMINATION:   Physical Exam  GENERAL:  63 y.o.-year-old patient lying in the bed on mechanical ventilator EYES: Pupils equal, round, reactive to light and accommodation. No scleral icterus. Extraocular muscles intact.  HEENT: Head atraumatic, normocephalic. Oropharynx endotracheal tube noted NECK:  Supple, no jugular venous distention. No thyroid enlargement, no tenderness.  LUNGS: Decreased breath sounds bilaterally, scattered rales in both lungs. No use of accessory muscles of respiration.  CARDIOVASCULAR: S1, S2 tachycardia noted. No murmurs, rubs, or gallops.  ABDOMEN: Soft, nontender, nondistended. Bowel sounds present. No organomegaly or mass.  EXTREMITIES: No cyanosis, clubbing or edema b/l.    NEUROLOGIC: Not oriented to time place and person On ventilator PSYCHIATRIC: The patient is alert and oriented x none SKIN: No obvious rash, lesion, or ulcer.   LABORATORY PANEL:   CBC Recent Labs  Lab 05/04/18 0345 05/04/18 0815  WBC 16.3*  --   HGB 8.0* 9.0*  HCT 24.4*  --   PLT 169  --    ------------------------------------------------------------------------------------------------------------------ Chemistries  Recent Labs  Lab 05/03/18 1747  05/04/18 0815  NA 142   < > 140  K 4.7   <  > 5.7*  CL 113*   < > 109  CO2 23   < > 22  GLUCOSE 98   < > 77  BUN 38*   < > 61*  CREATININE 3.49*   < > 4.11*  CALCIUM 6.8*   < > 7.6*  AST 20  --   --   ALT 14  --   --   ALKPHOS 61  --   --   BILITOT 0.6  --   --    < > = values in this interval not displayed.   ------------------------------------------------------------------------------------------------------------------  Cardiac Enzymes No results for input(s): TROPONINI in the last 168 hours. ------------------------------------------------------------------------------------------------------------------  RADIOLOGY:  Ct Head Wo Contrast  Result Date: 05/04/2018 CLINICAL DATA:  63 y/o  M; hematemesis and altered mental status. EXAM: CT HEAD WITHOUT CONTRAST TECHNIQUE: Contiguous axial images were obtained from the base of the skull through the vertex without intravenous contrast. COMPARISON:  04/27/2013 CT head. FINDINGS: Brain: No evidence of acute infarction, hemorrhage, hydrocephalus, extra-axial collection or mass lesion/mass effect. Stable nonspecific white matter hypodensities compatible chronic microvascular ischemic changes and stable volume loss of the brain. Vascular: Calcific atherosclerosis of carotid siphons and the vertebral arteries. No hyperdense vessel is evident. Skull: Normal. Negative for fracture or focal lesion. Sinuses/Orbits: No acute finding. Other: None. IMPRESSION: 1. No acute intracranial abnormality identified. 2. Stable chronic microvascular ischemic changes and volume loss of the brain. Electronically Signed   By: Kristine Garbe M.D.   On: 05/04/2018 00:52   Dg Chest Portable 1 View  Result Date: 05/03/2018 CLINICAL DATA:  Endotracheal tube placement EXAM: PORTABLE CHEST 1 VIEW COMPARISON:  02/07/2018 FINDINGS: Endotracheal tube tip is at the level of the clavicular heads. Orogastric tube tip and side port project over the gastric fundus. There is mild cardiomegaly. No focal airspace  consolidation or pulmonary edema. IMPRESSION: 1. ET tube tip at the level of the clavicular heads. 2. OG tube tip and side port project over the gastric fundus. Electronically Signed   By: Ulyses Jarred M.D.   On: 05/03/2018 20:05   Ct Angio Abd/pel W/ And/or W/o  Result Date: 05/04/2018 CLINICAL DATA:  Altered mental status, gastrointestinal bleeding. Hematemesis and hematochezia with hypotension. Patient underwent hemodialysis today. EXAM: CTA ABDOMEN AND PELVIS WITHOUT AND WITH CONTRAST TECHNIQUE: Multidetector CT imaging of the abdomen and pelvis was performed using the standard protocol during bolus administration of intravenous contrast. Multiplanar reconstructed images and MIPs were obtained and reviewed to evaluate the vascular anatomy. CONTRAST:  171mL ISOVUE-370 IOPAMIDOL (ISOVUE-370) INJECTION 76% COMPARISON:  None. FINDINGS: VASCULAR Aorta: Moderate to marked calcific atherosclerosis of the thoracic aorta without aneurysmal the aneurysm or dissection. Celiac: Patent without significant stenosis. Scattered atherosclerotic calcified plaque noted. SMA: Atherosclerotic without aneurysm or dissection. Renals: Densely calcified at the origins of both renal arteries. No aneurysm. No definite dissection. IMA: Patent Inflow: Atherosclerotic and patent without significant stenosis, aneurysm or dissection. Proximal Outflow: Bilateral common femoral and visualized portions of the superficial and profunda femoral arteries are patent without evidence of aneurysm, dissection, vasculitis or significant stenosis. Moderate atherosclerosis noted bilaterally. Veins: Central line catheter in the left common iliac vein. Review of the MIP images confirms the above findings. NON-VASCULAR Lower chest: Cardiomegaly is noted with coronary arteriosclerosis. No pericardial effusion or thickening. Pulmonary consolidation at the left lung base likely representing atelectatic change. Gastric tube is noted within the esophagus  extending into the stomach. Hepatobiliary: Physiologic distention of the gallbladder without stones. No enhancing mass or biliary dilatation of the liver. Pancreas: Normal without ductal dilatation, mass or inflammation. Spleen: Normal size spleen.  No mass. Adrenals/Urinary Tract: Normal bilateral adrenal glands. Atrophic kidneys with renovascular calcifications. No renal mass nor obstructive uropathy. No hydroureteronephrosis. The urinary bladder is decompressed. Stomach/Bowel: A gastric tube is seen within the physiologically distended stomach. No significant rugal thickening to suggest gastritis. The duodenal bulb, sweep and ligament Treitz are normal. No significant bowel distention or inflammation. No obstruction is noted. The colon is physiologically distended with stool. Mild diffuse mural thickening along the descending and sigmoid colon is likely due to underdistention and less likely inflammatory change. The appendix is nondistended and unremarkable. Lymphatic: No lymphadenopathy by CT size criteria. Reproductive: Mildly enlarged prostate measuring 4.7 x 4.2 cm on series 6/196. Other: Mild soft tissue anasarca.  No free air nor free fluid. Musculoskeletal: Diffuse sclerosis of the included axial and appendicular skeleton consistent with changes of chronic renal osteodystrophy. Degenerative disc disease of the thoracolumbar spine greatest from L2 through L5. IMPRESSION: VASCULAR 1. Atherosclerotic calcifications of the abdominal aorta without aneurysm or dissection. Patent atherosclerotic branch vessels as above. 2. No evidence of active hemorrhage. NON-VASCULAR 1. No CT findings to explain the patient's hemoptysis nor hematochezia. 2. Mild soft tissue anasarca. 3. Cardiomegaly with coronary arteriosclerosis. Pulmonary consolidation medially at left base. 4. Atrophic bilateral kidneys. Electronically Signed   By: Ashley Royalty M.D.   On: 05/04/2018 01:11     ASSESSMENT AND PLAN:  63 year old male  patient with history of end-stage renal disease on dialysis currently in the ICU for gastrointestinal bleeding from  duodenal ulcer  -Acute gastrointestinal bleeding Status post GI evaluation Continue IV Protonix drip Serial hemoglobin hematocrit monitoring EGD deferred this morning secondary to hyperkalemia FFP and PRBC transfusions  -Hyperkalemia Patient on CRRT Follow potassium levels  -Acute respiratory failure Continue mechanical ventilation Intensivist follow-up Continue vent bundle  -Aspiration pneumonia Continue IV Unasyn antibiotic  All the records are reviewed and case discussed with Care Management/Social Worker. Management plans discussed with the patient, family and they are in agreement.  CODE STATUS: Full code  DVT Prophylaxis: SCDs  TOTAL TIME TAKING CARE OF THIS PATIENT: 37 minutes.   POSSIBLE D/C IN 2-3 DAYS, DEPENDING ON CLINICAL CONDITION.  Saundra Shelling M.D on 05/04/2018 at 11:08 AM  Between 7am to 6pm - Pager - [phone]  After 6pm go to [email or web address] - password [email or web address] [facility]  Office  [phone]  CC: Primary care physician; Donnie Coffin, MD  Note: This dictation was prepared with Dragon dictation along with smaller phrase technology. Any transcriptional errors that result from this process are unintentional.

## 2018-05-05 ENCOUNTER — Inpatient Hospital Stay: Payer: Medicare Other

## 2018-05-05 ENCOUNTER — Other Ambulatory Visit: Payer: Medicare Other

## 2018-05-05 ENCOUNTER — Encounter: Payer: Self-pay | Admitting: Gastroenterology

## 2018-05-05 DIAGNOSIS — Z9911 Dependence on respirator [ventilator] status: Secondary | ICD-10-CM

## 2018-05-05 DIAGNOSIS — I9589 Other hypotension: Secondary | ICD-10-CM

## 2018-05-05 DIAGNOSIS — F1910 Other psychoactive substance abuse, uncomplicated: Secondary | ICD-10-CM

## 2018-05-05 DIAGNOSIS — E861 Hypovolemia: Secondary | ICD-10-CM

## 2018-05-05 LAB — RENAL FUNCTION PANEL
ANION GAP: 7 (ref 5–15)
Albumin: 2.1 g/dL — ABNORMAL LOW (ref 3.5–5.0)
Albumin: 2.2 g/dL — ABNORMAL LOW (ref 3.5–5.0)
Albumin: 2 g/dL — ABNORMAL LOW (ref 3.5–5.0)
Albumin: 2.1 g/dL — ABNORMAL LOW (ref 3.5–5.0)
Albumin: 2.2 g/dL — ABNORMAL LOW (ref 3.5–5.0)
Anion gap: 8 (ref 5–15)
Anion gap: 9 (ref 5–15)
Anion gap: 10 (ref 5–15)
Anion gap: 9 (ref 5–15)
BUN: 38 mg/dL — ABNORMAL HIGH (ref 8–23)
BUN: 35 mg/dL — ABNORMAL HIGH (ref 8–23)
BUN: 35 mg/dL — ABNORMAL HIGH (ref 8–23)
BUN: 30 mg/dL — ABNORMAL HIGH (ref 8–23)
BUN: 26 mg/dL — AB (ref 8–23)
CHLORIDE: 102 mmol/L (ref 98–111)
CHLORIDE: 102 mmol/L (ref 98–111)
CHLORIDE: 102 mmol/L (ref 98–111)
CO2: 24 mmol/L (ref 22–32)
CO2: 24 mmol/L (ref 22–32)
CO2: 24 mmol/L (ref 22–32)
CO2: 23 mmol/L (ref 22–32)
CO2: 24 mmol/L (ref 22–32)
Calcium: 7.8 mg/dL — ABNORMAL LOW (ref 8.9–10.3)
Calcium: 7.7 mg/dL — ABNORMAL LOW (ref 8.9–10.3)
Calcium: 7.9 mg/dL — ABNORMAL LOW (ref 8.9–10.3)
Calcium: 7.8 mg/dL — ABNORMAL LOW (ref 8.9–10.3)
Calcium: 7.7 mg/dL — ABNORMAL LOW (ref 8.9–10.3)
Chloride: 106 mmol/L (ref 98–111)
Chloride: 104 mmol/L (ref 98–111)
Creatinine, Ser: 2.86 mg/dL — ABNORMAL HIGH (ref 0.61–1.24)
Creatinine, Ser: 2.73 mg/dL — ABNORMAL HIGH (ref 0.61–1.24)
Creatinine, Ser: 2.5 mg/dL — ABNORMAL HIGH (ref 0.61–1.24)
Creatinine, Ser: 2.49 mg/dL — ABNORMAL HIGH (ref 0.61–1.24)
Creatinine, Ser: 2.16 mg/dL — ABNORMAL HIGH (ref 0.61–1.24)
GFR calc Af Amer: 27 mL/min — ABNORMAL LOW (ref >60)
GFR calc Af Amer: 30 mL/min — ABNORMAL LOW (ref >60)
GFR calc Af Amer: 30 mL/min — ABNORMAL LOW (ref >60)
GFR calc Af Amer: 36 mL/min — ABNORMAL LOW (ref >60)
GFR calc non Af Amer: 22 mL/min — ABNORMAL LOW (ref >60)
GFR calc non Af Amer: 24 mL/min — ABNORMAL LOW (ref >60)
GFR calc non Af Amer: 26 mL/min — ABNORMAL LOW (ref >60)
GFR calc non Af Amer: 26 mL/min — ABNORMAL LOW (ref >60)
GFR calc non Af Amer: 31 mL/min — ABNORMAL LOW (ref >60)
GFR, EST AFRICAN AMERICAN: 26 mL/min — AB (ref >60)
GLUCOSE: 161 mg/dL — AB (ref 70–99)
Glucose, Bld: 196 mg/dL — ABNORMAL HIGH (ref 70–99)
Glucose, Bld: 179 mg/dL — ABNORMAL HIGH (ref 70–99)
Glucose, Bld: 108 mg/dL — ABNORMAL HIGH (ref 70–99)
Glucose, Bld: 153 mg/dL — ABNORMAL HIGH (ref 70–99)
POTASSIUM: 3.9 mmol/L (ref 3.5–5.1)
POTASSIUM: 3.7 mmol/L (ref 3.5–5.1)
Phosphorus: 4.6 mg/dL (ref 2.5–4.6)
Phosphorus: 4.8 mg/dL — ABNORMAL HIGH (ref 2.5–4.6)
Phosphorus: 5 mg/dL — ABNORMAL HIGH (ref 2.5–4.6)
Phosphorus: 4.9 mg/dL — ABNORMAL HIGH (ref 2.5–4.6)
Phosphorus: 4.2 mg/dL (ref 2.5–4.6)
Potassium: 4.1 mmol/L (ref 3.5–5.1)
Potassium: 3.9 mmol/L (ref 3.5–5.1)
Potassium: 4.1 mmol/L (ref 3.5–5.1)
Sodium: 138 mmol/L (ref 135–145)
Sodium: 135 mmol/L (ref 135–145)
Sodium: 135 mmol/L (ref 135–145)
Sodium: 135 mmol/L (ref 135–145)
Sodium: 135 mmol/L (ref 135–145)

## 2018-05-05 LAB — MAGNESIUM
MAGNESIUM: 1.6 mg/dL — AB (ref 1.7–2.4)
Magnesium: 1.6 mg/dL — ABNORMAL LOW (ref 1.7–2.4)
Magnesium: 1.7 mg/dL (ref 1.7–2.4)
Magnesium: 1.8 mg/dL (ref 1.7–2.4)
Magnesium: 1.8 mg/dL (ref 1.7–2.4)
Magnesium: 1.9 mg/dL (ref 1.7–2.4)

## 2018-05-05 LAB — GLUCOSE, CAPILLARY
GLUCOSE-CAPILLARY: 157 mg/dL — AB (ref 70–99)
GLUCOSE-CAPILLARY: 128 mg/dL — AB (ref 70–99)
Glucose-Capillary: 162 mg/dL — ABNORMAL HIGH (ref 70–99)
Glucose-Capillary: 132 mg/dL — ABNORMAL HIGH (ref 70–99)
Glucose-Capillary: 137 mg/dL — ABNORMAL HIGH (ref 70–99)
Glucose-Capillary: 109 mg/dL — ABNORMAL HIGH (ref 70–99)
Glucose-Capillary: 89 mg/dL (ref 70–99)
Glucose-Capillary: 69 mg/dL — ABNORMAL LOW (ref 70–99)

## 2018-05-05 LAB — CBC
HCT: 26.1 % — ABNORMAL LOW (ref 39.0–52.0)
Hemoglobin: 8.6 g/dL — ABNORMAL LOW (ref 13.0–17.0)
MCH: 30 pg (ref 26.0–34.0)
MCHC: 33 g/dL (ref 30.0–36.0)
MCV: 90.9 fL (ref 80.0–100.0)
Platelets: 108 10*3/uL — ABNORMAL LOW (ref 150–400)
RBC: 2.87 MIL/uL — ABNORMAL LOW (ref 4.22–5.81)
RDW: 17.7 % — ABNORMAL HIGH (ref 11.5–15.5)
WBC: 17.8 10*3/uL — ABNORMAL HIGH (ref 4.0–10.5)
nRBC: 0.2 % (ref 0.0–0.2)

## 2018-05-05 LAB — BLOOD GAS, ARTERIAL
Acid-Base Excess: 2.2 mmol/L — ABNORMAL HIGH (ref 0.0–2.0)
Bicarbonate: 25.3 mmol/L (ref 20.0–28.0)
FIO2: 0.28
O2 Saturation: 95.7 %
PATIENT TEMPERATURE: 37
pCO2 arterial: 34 mmHg (ref 32.0–48.0)
pH, Arterial: 7.48 — ABNORMAL HIGH (ref 7.350–7.450)
pO2, Arterial: 74 mmHg — ABNORMAL LOW (ref 83.0–108.0)

## 2018-05-05 LAB — PROCALCITONIN: Procalcitonin: 5.81 ng/mL

## 2018-05-05 LAB — HEMOGLOBIN AND HEMATOCRIT, BLOOD
HCT: 26 % — ABNORMAL LOW (ref 39.0–52.0)
Hemoglobin: 8.6 g/dL — ABNORMAL LOW (ref 13.0–17.0)

## 2018-05-05 MED ORDER — PANTOPRAZOLE SODIUM 40 MG IV SOLR
40.0000 mg | Freq: Two times a day (BID) | INTRAVENOUS | Status: DC
  Administered 2018-05-05 – 2018-05-07 (×6): 40 mg via INTRAVENOUS
  Filled 2018-05-05 (×6): qty 40

## 2018-05-05 MED ORDER — PUREFLOW DIALYSIS SOLUTION
INTRAVENOUS | Status: DC
  Administered 2018-05-05: 21:00:00 via INTRAVENOUS_CENTRAL

## 2018-05-05 MED ORDER — DEXTROSE 10 % IV SOLN
INTRAVENOUS | Status: DC
  Administered 2018-05-05 – 2018-05-08 (×3): via INTRAVENOUS

## 2018-05-05 MED ORDER — DEXTROSE 50 % IV SOLN
50.0000 mL | Freq: Once | INTRAVENOUS | Status: AC
  Administered 2018-05-05: 50 mL via INTRAVENOUS
  Filled 2018-05-05: qty 50

## 2018-05-05 MED ORDER — MAGNESIUM SULFATE 2 GM/50ML IV SOLN
2.0000 g | Freq: Once | INTRAVENOUS | Status: AC
  Administered 2018-05-05: 2 g via INTRAVENOUS
  Filled 2018-05-05: qty 50

## 2018-05-05 MED ORDER — LORAZEPAM 2 MG/ML IJ SOLN
INTRAMUSCULAR | Status: AC
  Administered 2018-05-05: 1 mg via INTRAVENOUS
  Filled 2018-05-05: qty 1

## 2018-05-05 MED ORDER — ACETAMINOPHEN 650 MG RE SUPP: 650.0000 mg | Freq: Four times a day (QID) | RECTAL | Status: DC | PRN

## 2018-05-05 MED ORDER — ACETAMINOPHEN 325 MG PO TABS: 650.0000 mg | ORAL_TABLET | Freq: Four times a day (QID) | ORAL | Status: DC | PRN

## 2018-05-05 MED ORDER — MAGNESIUM SULFATE IN D5W 1-5 GM/100ML-% IV SOLN
1.0000 g | Freq: Once | INTRAVENOUS | Status: AC
  Administered 2018-05-05: 1 g via INTRAVENOUS
  Filled 2018-05-05: qty 100

## 2018-05-05 MED ORDER — CYANOCOBALAMIN 1000 MCG/ML IJ SOLN
1000.0000 ug | Freq: Once | INTRAMUSCULAR | Status: AC
  Administered 2018-05-05: 1000 ug via SUBCUTANEOUS
  Filled 2018-05-05: qty 1

## 2018-05-05 MED ORDER — LORAZEPAM 2 MG/ML IJ SOLN
0.5000 mg | INTRAMUSCULAR | Status: DC | PRN
  Administered 2018-05-05 – 2018-05-06 (×8): 1 mg via INTRAVENOUS
  Filled 2018-05-05 (×8): qty 1

## 2018-05-05 MED ORDER — LORAZEPAM 2 MG/ML IJ SOLN
1.0000 mg | Freq: Once | INTRAMUSCULAR | Status: AC
  Administered 2018-05-05: 1 mg via INTRAVENOUS

## 2018-05-05 NOTE — Progress Notes (Signed)
PCCM PROGRESS NOTE   PT PROFILE: 63 y.o. male with history of ESRD on HD, type 2 diabetes, recently diagnosed duodenal ulcer, CHF presented to [facility] ED evening 03/11 with coffee ground emesis and AMS. Intubated in ED for airway protection.  Received multiple units PRBCs, developed severe hyperkalemia prompting initiation of CRRT.  MAJOR EVENTS/TEST RESULTS: 03/11 admission as noted above 03/12 CTA abd/pelvis: No acute findings 03/12 CT head: No acute findings 03/12 CRRT initiated.  03/12 Norepi initiated for sedation induced hypotension 03/12 EGD: Nonbleeding duodenal ulcer with visible vessel, cauterized and clip placed 03/13 Extubated  INDWELLING DEVICES: ETT 03/11 >> 03/13 R femoral HD cath 03/12 >>  L femoral CVL 03/12 >>   MICRO DATA: MRSA PCR 3/11 >> negative  ANTIMICROBIALS: Amp-sulbactam 03/11 >> 3/12   SUBJ: Passed SBT and extubated.   Vitals:   05/05/18 1100 05/05/18 1130 05/05/18 1200 05/05/18 1230  BP: (!) 90/56 (!) 110/58 (!) 103/58 105/62  Pulse: 73 74 75 77  Resp: (!) 24 (!) 27 (!) 24 (!) 23  Temp:   97.6 F (36.4 C)   TempSrc:      SpO2: 98% 100% 100% 100%  Weight:      Height:       RA  EXAM:  Gen: NAD HEENT: NCAT, sclerae white Neck: No JVD Lungs: breath sounds full, no wheezes or other adventitious sounds Cardiovascular: RRR, no murmurs Abdomen: Soft, nontender, normal BS Ext: without clubbing, cyanosis, edema Neuro: grossly intact Skin: Limited exam, no lesions noted   DATA:   BMP Latest Ref Rng & Units 05/05/2018 05/05/2018 05/05/2018  Glucose 70 - 99 mg/dL 161(H) 179(H) 196(H)  BUN 8 - 23 mg/dL 35(H) 35(H) 38(H)  Creatinine 0.61 - 1.24 mg/dL 2.50(H) 2.73(H) 2.86(H)  Sodium 135 - 145 mmol/L 135 135 138  Potassium 3.5 - 5.1 mmol/L 4.1 3.9 4.1  Chloride 98 - 111 mmol/L 102 104 106  CO2 22 - 32 mmol/L 24 24 24   Calcium 8.9 - 10.3 mg/dL 7.9(L) 7.7(L) 7.8(L)    CBC Latest Ref Rng & Units 05/05/2018 05/05/2018 05/04/2018  WBC 4.0 - 10.5  K/uL 17.8(H) - -  Hemoglobin 13.0 - 17.0 g/dL 8.6(L) 8.6(L) 8.7(L)  Hematocrit 39.0 - 52.0 % 26.1(L) 26.0(L) 26.3(L)  Platelets 150 - 400 K/uL 108(L) - -    CXR:  Increased lung opacity from atelectasis or pneumonia. Cardiomegaly  I have personally reviewed all chest radiographs reported above including CXRs and CT chest unless otherwise indicated  IMPRESSION:   VDRF - intubated for airway protection Hypotension due to acute blood loss and sedation ESRD Hyperkalemia, resolved UGIB, duodenal ulcer.  Presently no active bleeding Acute blood loss anemia History of substance abuse   PLAN:  Extubated under my supervision today Supplemental oxygen as needed Monitor in ICU until off CRRT Monitor BMET intermittently Monitor I/Os Correct electrolytes as indicated CRRT per nephrology at least through today DVT prophylaxis: SCDs Monitor CBC intermittently Transfuse per usual guidelines Monitor for withdrawal syndromes Advance activity and nutrition when able   Merton Border, MD PCCM service Mobile [phone] Pager [phone] 05/05/2018 1:02 PM

## 2018-05-05 NOTE — Progress Notes (Signed)
[facility] [place] NAME: Keiffer Piper    MR#:  [id number]  DATE OF BIRTH:  [birth year]  SUBJECTIVE:  CHIEF COMPLAINT:   Chief Complaint  Patient presents with  . Rectal Bleeding  Patient seen and evaluated today Currently on ventilator Tidal volume 550 Rate 10 PEEP 5 FiO2 30%  REVIEW OF SYSTEMS:    ROS On ventilator unable to give any history  DRUG ALLERGIES:   Allergies  Allergen Reactions  . Sulfa Antibiotics Rash and Shortness Of Breath    VITALS:  Blood pressure (!) 80/54, pulse 61, temperature (!) 94.9 F (34.9 C), temperature source Oral, resp. rate 17, height 6' (1.829 m), weight 77.7 kg, SpO2 100 %.  PHYSICAL EXAMINATION:   Physical Exam  GENERAL:  63 y.o.-year-old patient lying in the bed on mechanical ventilator On heating blanket. EYES: Pupils equal, round, reactive to light and accommodation. No scleral icterus. Extraocular muscles intact.  HEENT: Head atraumatic, normocephalic. Oropharynx endotracheal tube noted NECK:  Supple, no jugular venous distention. No thyroid enlargement, no tenderness.  LUNGS: Decreased breath sounds bilaterally, scattered rales in both lungs. No use of accessory muscles of respiration.  CARDIOVASCULAR: S1, S2 tachycardia noted. No murmurs, rubs, or gallops.  ABDOMEN: Soft, nontender, nondistended. Bowel sounds present. No organomegaly or mass.  EXTREMITIES: No cyanosis, clubbing or edema b/l.    NEUROLOGIC: Not oriented to time place and person On ventilator PSYCHIATRIC: The patient is alert and oriented x none SKIN: No obvious rash, lesion, or ulcer.   LABORATORY PANEL:   CBC Recent Labs  Lab 05/05/18 0508  WBC 17.8*  HGB 8.6*  HCT 26.1*  PLT 108*   ------------------------------------------------------------------------------------------------------------------ Chemistries  Recent Labs  Lab 05/03/18 1747  05/05/18 0820  NA 142   < > 135  K 4.7   < > 4.1  CL 113*    < > 102  CO2 23   < > 24  GLUCOSE 98   < > 161*  BUN 38*   < > 35*  CREATININE 3.49*   < > 2.50*  CALCIUM 6.8*   < > 7.9*  MG  --    < > 1.7  AST 20  --   --   ALT 14  --   --   ALKPHOS 61  --   --   BILITOT 0.6  --   --    < > = values in this interval not displayed.   ------------------------------------------------------------------------------------------------------------------  Cardiac Enzymes No results for input(s): TROPONINI in the last 168 hours. ------------------------------------------------------------------------------------------------------------------  RADIOLOGY:  Ct Head Wo Contrast  Result Date: 05/04/2018 CLINICAL DATA:  63 y/o  M; hematemesis and altered mental status. EXAM: CT HEAD WITHOUT CONTRAST TECHNIQUE: Contiguous axial images were obtained from the base of the skull through the vertex without intravenous contrast. COMPARISON:  04/27/2013 CT head. FINDINGS: Brain: No evidence of acute infarction, hemorrhage, hydrocephalus, extra-axial collection or mass lesion/mass effect. Stable nonspecific white matter hypodensities compatible chronic microvascular ischemic changes and stable volume loss of the brain. Vascular: Calcific atherosclerosis of carotid siphons and the vertebral arteries. No hyperdense vessel is evident. Skull: Normal. Negative for fracture or focal lesion. Sinuses/Orbits: No acute finding. Other: None. IMPRESSION: 1. No acute intracranial abnormality identified. 2. Stable chronic microvascular ischemic changes and volume loss of the brain. Electronically Signed   By: Kristine Garbe M.D.   On: 05/04/2018 00:52   Dg Chest Port 1 View  Result Date: 05/05/2018 CLINICAL DATA:  Respiratory failure EXAM: PORTABLE CHEST 1 VIEW COMPARISON:  Yesterday FINDINGS: Endotracheal tube tip just below the clavicular heads. The orogastric tube reaches the stomach. Bilateral lung opacity that is increased. No Kerley lines, effusion, or pneumothorax. Left  brachiocephalic venous stent. IMPRESSION: 1. Unremarkable hardware positioning. 2. Increased lung opacity from atelectasis or pneumonia. 3. Cardiomegaly. Electronically Signed   By: Monte Fantasia M.D.   On: 05/05/2018 06:42   Portable Chest Xray  Result Date: 05/04/2018 CLINICAL DATA:  Central line placement EXAM: PORTABLE CHEST 1 VIEW COMPARISON:  05/03/2018 FINDINGS: Endotracheal tube 4 cm from carina. NG tube extends the stomach. Brachycephalic vein stent noted. No central venous line. IMPRESSION: Endotracheal tube and NG tube in good position Electronically Signed   By: Suzy Bouchard M.D.   On: 05/04/2018 13:15   Dg Chest Portable 1 View  Result Date: 05/03/2018 CLINICAL DATA:  Endotracheal tube placement EXAM: PORTABLE CHEST 1 VIEW COMPARISON:  02/07/2018 FINDINGS: Endotracheal tube tip is at the level of the clavicular heads. Orogastric tube tip and side port project over the gastric fundus. There is mild cardiomegaly. No focal airspace consolidation or pulmonary edema. IMPRESSION: 1. ET tube tip at the level of the clavicular heads. 2. OG tube tip and side port project over the gastric fundus. Electronically Signed   By: Ulyses Jarred M.D.   On: 05/03/2018 20:05   Dg Abd Portable 1v  Result Date: 05/04/2018 CLINICAL DATA:  Orogastric tube placement. EXAM: PORTABLE ABDOMEN - 1 VIEW COMPARISON:  Body CT 05/04/2018 FINDINGS: The bowel gas pattern is normal. Enteric catheter within the expected location of the gastric body. Vascular calcifications noted. Bilateral iliac catheter seen. IMPRESSION: Enteric catheter within the expected location of the gastric body. Electronically Signed   By: Fidela Salisbury M.D.   On: 05/04/2018 13:42   Ct Angio Abd/pel W/ And/or W/o  Result Date: 05/04/2018 CLINICAL DATA:  Altered mental status, gastrointestinal bleeding. Hematemesis and hematochezia with hypotension. Patient underwent hemodialysis today. EXAM: CTA ABDOMEN AND PELVIS WITHOUT AND WITH  CONTRAST TECHNIQUE: Multidetector CT imaging of the abdomen and pelvis was performed using the standard protocol during bolus administration of intravenous contrast. Multiplanar reconstructed images and MIPs were obtained and reviewed to evaluate the vascular anatomy. CONTRAST:  187mL ISOVUE-370 IOPAMIDOL (ISOVUE-370) INJECTION 76% COMPARISON:  None. FINDINGS: VASCULAR Aorta: Moderate to marked calcific atherosclerosis of the thoracic aorta without aneurysmal the aneurysm or dissection. Celiac: Patent without significant stenosis. Scattered atherosclerotic calcified plaque noted. SMA: Atherosclerotic without aneurysm or dissection. Renals: Densely calcified at the origins of both renal arteries. No aneurysm. No definite dissection. IMA: Patent Inflow: Atherosclerotic and patent without significant stenosis, aneurysm or dissection. Proximal Outflow: Bilateral common femoral and visualized portions of the superficial and profunda femoral arteries are patent without evidence of aneurysm, dissection, vasculitis or significant stenosis. Moderate atherosclerosis noted bilaterally. Veins: Central line catheter in the left common iliac vein. Review of the MIP images confirms the above findings. NON-VASCULAR Lower chest: Cardiomegaly is noted with coronary arteriosclerosis. No pericardial effusion or thickening. Pulmonary consolidation at the left lung base likely representing atelectatic change. Gastric tube is noted within the esophagus extending into the stomach. Hepatobiliary: Physiologic distention of the gallbladder without stones. No enhancing mass or biliary dilatation of the liver. Pancreas: Normal without ductal dilatation, mass or inflammation. Spleen: Normal size spleen.  No mass. Adrenals/Urinary Tract: Normal bilateral adrenal glands. Atrophic kidneys with renovascular calcifications. No renal mass nor obstructive uropathy. No hydroureteronephrosis. The urinary bladder is decompressed. Stomach/Bowel: A gastric  tube is seen within the physiologically distended stomach. No significant rugal thickening to suggest gastritis. The duodenal bulb, sweep and ligament Treitz are normal. No significant bowel distention or inflammation. No obstruction is noted. The colon is physiologically distended with stool. Mild diffuse mural thickening along the descending and sigmoid colon is likely due to underdistention and less likely inflammatory change. The appendix is nondistended and unremarkable. Lymphatic: No lymphadenopathy by CT size criteria. Reproductive: Mildly enlarged prostate measuring 4.7 x 4.2 cm on series 6/196. Other: Mild soft tissue anasarca.  No free air nor free fluid. Musculoskeletal: Diffuse sclerosis of the included axial and appendicular skeleton consistent with changes of chronic renal osteodystrophy. Degenerative disc disease of the thoracolumbar spine greatest from L2 through L5. IMPRESSION: VASCULAR 1. Atherosclerotic calcifications of the abdominal aorta without aneurysm or dissection. Patent atherosclerotic branch vessels as above. 2. No evidence of active hemorrhage. NON-VASCULAR 1. No CT findings to explain the patient's hemoptysis nor hematochezia. 2. Mild soft tissue anasarca. 3. Cardiomegaly with coronary arteriosclerosis. Pulmonary consolidation medially at left base. 4. Atrophic bilateral kidneys. Electronically Signed   By: Ashley Royalty M.D.   On: 05/04/2018 01:11     ASSESSMENT AND PLAN:  63 year old male patient with history of end-stage renal disease on dialysis currently in the ICU for gastrointestinal bleeding from duodenal ulcer  -Acute gastrointestinal bleeding S/p EGD by GI Recent upper endoscopy showed 1 cm ulcer in the duodenal bulb and H. pylori antigen positive.  Patient presented again with hematemesis and hematochezia, hypotension and altered mental status.  S/p 3 multiple units of blood transfusion. EGD yesterday showed that the esophagus was normal, 1 nonbleeding duodenal ulcer  with visible vessel was found.  Coagulation for hemostasis was done and a hemostatic clip was placed by endoscopy. Continue IV Protonix drip  -Hyperkalemia with setting of ESRD Patient on CRRT by nephrology Follow potassium levels  -Acute respiratory failure Continue mechanical ventilation Intensivist follow-up Weaning trial today  -Aspiration pneumonia Continue IV Unasyn antibiotic  -Hypotension Continue IV pressors Intensivist follow-up July 2019 echocardiogram shows moderate to severe mitral regurgitation, calcified mitral valve and pulmonary hypertension  All the records are reviewed and case discussed with Care Management/Social Worker. Management plans discussed with the patient, family and they are in agreement.  CODE STATUS: Full code  DVT Prophylaxis: SCDs  TOTAL TIME TAKING CARE OF THIS PATIENT: 35 minutes.   POSSIBLE D/C IN 2-3 DAYS, DEPENDING ON CLINICAL CONDITION.  Saundra Shelling M.D on 05/05/2018 at 11:14 AM  Between 7am to 6pm - Pager - [phone]  After 6pm go to [email or web address] - password [email or web address] [facility]  Office  [phone]  CC: Primary care physician; Donnie Coffin, MD  Note: This dictation was prepared with Dragon dictation along with smaller phrase technology. Any transcriptional errors that result from this process are unintentional.

## 2018-05-05 NOTE — Progress Notes (Signed)
[facility], [place] 05/05/18  Subjective:   Critically ill Pressors- Levophed 7 mcg Propofol for sedation Vent assisted. Fio2 30% Patient's blood sugars improved therefore D10 was discontinued  Objective:  Vital signs in last 24 hours:  Temp:  [94.9 F (34.9 C)-98.2 F (36.8 C)] 94.9 F (34.9 C) (03/13 0831) Pulse Rate:  [58-106] 61 (03/13 0906) Resp:  [15-29] 17 (03/13 0906) BP: (66-126)/(45-76) 80/54 (03/13 0906) SpO2:  [100 %] 100 % (03/13 0906) FiO2 (%):  [30 %-35 %] 30 % (03/13 0729) Weight:  [77.7 kg] 77.7 kg (03/13 0030)  Weight change: 3.2 kg Filed Weights   05/03/18 1752 05/03/18 2100 05/05/18 0030  Weight: 74.5 kg 71.4 kg 77.7 kg    Intake/Output:    Intake/Output Summary (Last 24 hours) at 05/05/2018 1002 Last data filed at 05/05/2018 0900 Gross per 24 hour  Intake 2792.83 ml  Output 0 ml  Net 2792.83 ml     Physical Exam: General:  Critically ill-appearing  HEENT  ET tube, OG tube, face appears swollen  Neck  supple, no masses  Pulm/lungs  ventilator assisted, FiO2 30%  CVS/Heart  tachycardic, irregular  Abdomen:   Soft, nontender  Extremities:  No edema  Neurologic:  Sedated   Skin:  no acute rashes  Access:  Left upper extremity AV fistula, right femoral temporary catheter       Basic Metabolic Panel:  Recent Labs  Lab 05/04/18 1355 05/04/18 1643 05/04/18 2055 05/05/18 0103 05/05/18 0509 05/05/18 0820  NA 141 137 137 138 135 135  K 4.5 4.2 4.3 4.1 3.9 4.1  CL 110 106 106 106 104 102  CO2 '23 23 23 24 24 24  ' GLUCOSE 171* 212* 202* 196* 179* 161*  BUN 54* 50* 44* 38* 35* 35*  CREATININE 3.79* 3.54* 3.06* 2.86* 2.73* 2.50*  CALCIUM 7.7* 7.6* 7.9* 7.8* 7.7* 7.9*  MG  --   --  1.6* 1.6* 1.6* 1.7  PHOS 4.5  --  4.6 4.6 4.8* 5.0*     CBC: Recent Labs  Lab 05/03/18 1747  05/04/18 0345 05/04/18 0815 05/04/18 1646 05/05/18 0103 05/05/18 0508  WBC 8.8  --  16.3*  --   --   --  17.8*  NEUTROABS 6.1   --   --   --   --   --   --   HGB 5.5*   < > 8.0* 9.0*  9.0* 8.7* 8.6* 8.6*  HCT 18.5*   < > 24.4* 27.7* 26.3* 26.0* 26.1*  MCV 100.0  --  91.7  --   --   --  90.9  PLT 183  --  169  --   --   --  108*   < > = values in this interval not displayed.      Lab Results  Component Value Date   HEPBSAG Negative 04/18/2018   HEPBSAB Reactive 02/15/2017      Microbiology:  Recent Results (from the past 240 hour(s))  MRSA PCR Screening     Status: None   Collection Time: 05/03/18  8:59 PM  Result Value Ref Range Status   MRSA by PCR NEGATIVE NEGATIVE Final    Comment:        The GeneXpert MRSA Assay (FDA approved for NASAL specimens only), is one component of a comprehensive MRSA colonization surveillance program. It is not intended to diagnose MRSA infection nor to guide or monitor treatment for MRSA infections. Performed at [facility], [address] [place], [place] [address]     Coagulation Studies: No results for input(s): LABPROT, INR in the last 72 hours.  Urinalysis: No results for input(s): COLORURINE, LABSPEC, PHURINE, GLUCOSEU, HGBUR, BILIRUBINUR, KETONESUR, PROTEINUR, UROBILINOGEN, NITRITE, LEUKOCYTESUR in the last 72 hours.  Invalid input(s): APPERANCEUR    Imaging: Ct Head Wo Contrast  Result Date: 05/04/2018 CLINICAL DATA:  62 y/o  M; hematemesis and altered mental status. EXAM: CT HEAD WITHOUT CONTRAST TECHNIQUE: Contiguous axial images were obtained from the base of the skull through the vertex without intravenous contrast. COMPARISON:  04/27/2013 CT head. FINDINGS: Brain: No evidence of acute infarction, hemorrhage, hydrocephalus, extra-axial collection or mass lesion/mass effect. Stable nonspecific white matter hypodensities compatible chronic microvascular ischemic changes and stable volume loss of the brain. Vascular: Calcific atherosclerosis of carotid siphons and the vertebral arteries. No hyperdense vessel is evident. Skull: Normal.  Negative for fracture or focal lesion. Sinuses/Orbits: No acute finding. Other: None. IMPRESSION: 1. No acute intracranial abnormality identified. 2. Stable chronic microvascular ischemic changes and volume loss of the brain. Electronically Signed   By: Kristine Garbe M.D.   On: 05/04/2018 00:52   Dg Chest Port 1 View  Result Date: 05/05/2018 CLINICAL DATA:  Respiratory failure EXAM: PORTABLE CHEST 1 VIEW COMPARISON:  Yesterday FINDINGS: Endotracheal tube tip just below the clavicular heads. The orogastric tube reaches the stomach. Bilateral lung opacity that is increased. No Kerley lines, effusion, or pneumothorax. Left brachiocephalic venous stent. IMPRESSION: 1. Unremarkable hardware positioning. 2. Increased lung opacity from atelectasis or pneumonia. 3. Cardiomegaly. Electronically Signed   By: Monte Fantasia M.D.   On: 05/05/2018 06:42   Portable Chest Xray  Result Date: 05/04/2018 CLINICAL DATA:  Central line placement EXAM: PORTABLE CHEST 1 VIEW COMPARISON:  05/03/2018 FINDINGS: Endotracheal tube 4 cm from carina. NG tube extends the stomach. Brachycephalic vein stent noted. No central venous line. IMPRESSION: Endotracheal tube and NG tube in good position Electronically Signed   By: Suzy Bouchard M.D.   On: 05/04/2018 13:15   Dg Chest Portable 1 View  Result Date: 05/03/2018 CLINICAL DATA:  Endotracheal tube placement EXAM: PORTABLE CHEST 1 VIEW COMPARISON:  02/07/2018 FINDINGS: Endotracheal tube tip is at the level of the clavicular heads. Orogastric tube tip and side port project over the gastric fundus. There is mild cardiomegaly. No focal airspace consolidation or pulmonary edema. IMPRESSION: 1. ET tube tip at the level of the clavicular heads. 2. OG tube tip and side port project over the gastric fundus. Electronically Signed   By: Ulyses Jarred M.D.   On: 05/03/2018 20:05   Dg Abd Portable 1v  Result Date: 05/04/2018 CLINICAL DATA:  Orogastric tube placement. EXAM:  PORTABLE ABDOMEN - 1 VIEW COMPARISON:  Body CT 05/04/2018 FINDINGS: The bowel gas pattern is normal. Enteric catheter within the expected location of the gastric body. Vascular calcifications noted. Bilateral iliac catheter seen. IMPRESSION: Enteric catheter within the expected location of the gastric body. Electronically Signed   By: Fidela Salisbury M.D.   On: 05/04/2018 13:42   Ct Angio Abd/pel W/ And/or W/o  Result Date: 05/04/2018 CLINICAL DATA:  Altered mental status, gastrointestinal bleeding. Hematemesis and hematochezia with hypotension. Patient underwent hemodialysis today. EXAM: CTA ABDOMEN AND PELVIS WITHOUT AND WITH CONTRAST TECHNIQUE: Multidetector CT imaging of the abdomen and pelvis was performed using the standard protocol during bolus administration of intravenous contrast. Multiplanar reconstructed images and MIPs were obtained and reviewed to evaluate the vascular anatomy. CONTRAST:  129m ISOVUE-370 IOPAMIDOL (ISOVUE-370) INJECTION 76% COMPARISON:  None. FINDINGS: VASCULAR Aorta: Moderate to marked calcific atherosclerosis of the thoracic aorta without aneurysmal the aneurysm or dissection. Celiac: Patent without significant stenosis. Scattered atherosclerotic calcified plaque noted. SMA: Atherosclerotic without aneurysm or dissection. Renals: Densely calcified at the origins of both renal arteries. No aneurysm. No definite dissection. IMA: Patent Inflow: Atherosclerotic and patent without significant stenosis, aneurysm or dissection. Proximal Outflow: Bilateral common femoral and visualized portions of the superficial and profunda femoral arteries are patent without evidence of aneurysm, dissection, vasculitis or significant stenosis. Moderate atherosclerosis noted bilaterally. Veins: Central line catheter in the left common iliac vein. Review of the MIP images confirms the above findings. NON-VASCULAR Lower chest: Cardiomegaly is noted with coronary arteriosclerosis. No pericardial  effusion or thickening. Pulmonary consolidation at the left lung base likely representing atelectatic change. Gastric tube is noted within the esophagus extending into the stomach. Hepatobiliary: Physiologic distention of the gallbladder without stones. No enhancing mass or biliary dilatation of the liver. Pancreas: Normal without ductal dilatation, mass or inflammation. Spleen: Normal size spleen.  No mass. Adrenals/Urinary Tract: Normal bilateral adrenal glands. Atrophic kidneys with renovascular calcifications. No renal mass nor obstructive uropathy. No hydroureteronephrosis. The urinary bladder is decompressed. Stomach/Bowel: A gastric tube is seen within the physiologically distended stomach. No significant rugal thickening to suggest gastritis. The duodenal bulb, sweep and ligament Treitz are normal. No significant bowel distention or inflammation. No obstruction is noted. The colon is physiologically distended with stool. Mild diffuse mural thickening along the descending and sigmoid colon is likely due to underdistention and less likely inflammatory change. The appendix is nondistended and unremarkable. Lymphatic: No lymphadenopathy by CT size criteria. Reproductive: Mildly enlarged prostate measuring 4.7 x 4.2 cm on series 6/196. Other: Mild soft tissue anasarca.  No free air nor free fluid. Musculoskeletal: Diffuse sclerosis of the included axial and appendicular skeleton consistent with changes of chronic renal osteodystrophy. Degenerative disc disease of the thoracolumbar spine greatest from L2 through L5. IMPRESSION: VASCULAR 1. Atherosclerotic calcifications of the abdominal aorta without aneurysm or dissection. Patent atherosclerotic branch vessels as above. 2. No evidence of active hemorrhage. NON-VASCULAR 1. No CT findings to explain the patient's hemoptysis nor hematochezia. 2. Mild soft tissue anasarca. 3. Cardiomegaly with coronary arteriosclerosis. Pulmonary consolidation medially at left base.  4. Atrophic bilateral kidneys. Electronically Signed   By: Ashley Royalty M.D.   On: 05/04/2018 01:11     Medications:   . norepinephrine (LEVOPHED) Adult infusion 2 mcg/min (05/05/18 0600)  . pantoprozole (PROTONIX) infusion 8 mg/hr (05/05/18 0600)  . propofol (DIPRIVAN) infusion 50 mcg/kg/min (05/05/18 0600)  . pureflow 2,000 mL/hr at 05/04/18 2148   . chlorhexidine gluconate (MEDLINE KIT)  15 mL Mouth Rinse BID  . etomidate  25 mg Intravenous Once  . mouth rinse  15 mL Mouth Rinse 10 times per day   acetaminophen **OR** acetaminophen, fentaNYL (SUBLIMAZE) injection, heparin, ipratropium-albuterol, midazolam, [DISCONTINUED] ondansetron **OR** ondansetron (ZOFRAN) IV, polyethylene glycol  Assessment/ Plan:  63 y.o. male withend stage renal disease on hemodialysis, hypertension, hepatitis C, anemia, seizure disorder, COPD/tobacco abuse,substance abuse and alcohol abuse,secondary hyperparathyroidism, chronic systolic CHF  [facility] [address]Left arm AVG. 195 minutes/73.5 kg  1.  End-stage renal disease with severe hyperkalemia Due to hypotension and requirement for pressors, patient was started on CRRT which he is tolerating well.  UF goal 0.  Currently on 0K bath for hyperkalemia Will change to 2 K once K is less than 4 -Once blood pressure improves, may need to increase ultrafiltration to 50  cc/h and subsequently 100 cc/h if tolerated  2.  Severe anemia of chronic kidney disease and GI bleed Recent upper endoscopy showed 1 cm ulcer in the duodenal bulb and H. pylori antigen positive.  Patient presented again with hematemesis and hematochezia, hypotension and altered mental status.  S/p 3 multiple units of blood transfusion. EGD yesterday showed that the esophagus was normal, 1 nonbleeding duodenal ulcer with visible vessel was found.  Coagulation for hemostasis was done and a hemostatic clip was placed by endoscopy.  3.  Cardiac July 2019 echo moderate to severe mitral  regurgitation, calcified mitral valve, 35 to 40%, pulmonary hypertension -pressure greater than 65 mmHg Currently hypotensive and requiring pressors  4.  Acute respiratory failure Requiring ventilator support FiO2 30% ICU team is planning to extubate. Hopefully blood pressure will improve off sedation.          LOS: [place] 3/13/202010:02 AM  [place], [place]  Note: This note was prepared with Dragon dictation. Any transcription errors are unintentional

## 2018-05-05 NOTE — Progress Notes (Signed)
Thomas Darby, MD [address]  [place]  [place], [place] [address]  Main: [phone]  Fax: [phone] Pager: [phone]   Subjective: Patient underwent EGD yesterday found to have duodenal ulcer with visible vessel status post cautery.  He is extubated early this morning.  He is agitated, delirious on restraints.  He is n.p.o.  He had 2 small episodes of dark reddish-brown bowel movements last night.  His hemoglobin is stable.  He is on Levophed at 65mg/min   Objective: Vital signs in last 24 hours: Vitals:   05/05/18 1300 05/05/18 1330 05/05/18 1400 05/05/18 1430  BP: (!) 102/54 104/61 (!) 101/51 (!) 106/58  Pulse: 76 78 78 75  Resp: (!) _0 (!) 29  Temp:      TempSrc:      SpO2: 97% 97% 95% 100%  Weight:      Height:       Weight change: 3.2 kg  Intake/Output Summary (Last 24 hours) at 05/05/2018 1552 Last data filed at 05/05/2018 1255 Gross per 24 hour  Intake 1782.5 ml  Output 0 ml  Net 1782.5 ml     Exam: Heart:: Regular rate and rhythm or S1S2 present Lungs: normal and clear to auscultation Abdomen: soft, nontender, normal bowel sounds   Lab Results: CBC Latest Ref Rng & Units 05/05/2018 05/05/2018 05/04/2018  WBC 4.0 - 10.5 K/uL 17.8(H) - -  Hemoglobin 13.0 - 17.0 g/dL 8.6(L) 8.6(L) 8.7(L)  Hematocrit 39.0 - 52.0 % 26.1(L) 26.0(L) 26.3(L)  Platelets 150 - 400 K/uL 108(L) - -   CMP Latest Ref Rng & Units 05/05/2018 05/05/2018 05/05/2018  Glucose 70 - 99 mg/dL 161(H) 179(H) 196(H)  BUN 8 - 23 mg/dL 35(H) 35(H) 38(H)  Creatinine 0.61 - 1.24 mg/dL 2.50(H) 2.73(H) 2.86(H)  Sodium 135 - 145 mmol/L 135 135 138  Potassium 3.5 - 5.1 mmol/L 4.1 3.9 4.1  Chloride 98 - 111 mmol/L 102 104 106  CO2 22 - 32 mmol/L _1 Calcium 8.9 - 10.3 mg/dL 7.9(L) 7.7(L) 7.8(L)  Total Protein 6.5 - 8.1 g/dL - - -  Total Bilirubin 0.3 - 1.2 mg/dL - - -  Alkaline Phos 38 - 126 U/L - - -  AST 15 - 41 U/L - - -  ALT 0 - 44 U/L - - -   Micro  Results: Recent Results (from the past 240 hour(s))  MRSA PCR Screening     Status: None   Collection Time: 05/03/18  8:59 PM  Result Value Ref Range Status   MRSA by PCR NEGATIVE NEGATIVE Final    Comment:        The GeneXpert MRSA Assay (FDA approved for NASAL specimens only), is one component of a comprehensive MRSA colonization surveillance program. It is not intended to diagnose MRSA infection nor to guide or monitor treatment for MRSA infections. [facility] [address] [place] [id number]   Studies/Results: Ct Head Wo Contrast  Result Date: 05/04/2018 CLINICAL DATA:  63y/o  M; hematemesis and altered mental status. EXAM: CT HEAD WITHOUT CONTRAST TECHNIQUE: Contiguous axial images were obtained from the base of the skull through the vertex without intravenous contrast. COMPARISON:  04/27/2013 CT head. FINDINGS: Brain: No evidence of acute infarction, hemorrhage, hydrocephalus, extra-axial collection or mass lesion/mass effect. Stable nonspecific white matter hypodensities compatible chronic microvascular ischemic changes and stable volume loss of the brain. Vascular: Calcific atherosclerosis of carotid siphons and the vertebral arteries. No  hyperdense vessel is evident. Skull: Normal. Negative for fracture or focal lesion. Sinuses/Orbits: No acute finding. Other: None. IMPRESSION: 1. No acute intracranial abnormality identified. 2. Stable chronic microvascular ischemic changes and volume loss of the brain. Electronically Signed   By: Kristine Garbe M.D.   On: 05/04/2018 00:52   Dg Chest Port 1 View  Result Date: 05/05/2018 CLINICAL DATA:  Respiratory failure EXAM: PORTABLE CHEST 1 VIEW COMPARISON:  Yesterday FINDINGS: Endotracheal tube tip just below the clavicular heads. The orogastric tube reaches the stomach. Bilateral lung opacity that is increased. No Kerley lines, effusion, or pneumothorax. Left brachiocephalic venous stent.  IMPRESSION: 1. Unremarkable hardware positioning. 2. Increased lung opacity from atelectasis or pneumonia. 3. Cardiomegaly. Electronically Signed   By: Monte Fantasia M.D.   On: 05/05/2018 06:42   Portable Chest Xray  Result Date: 05/04/2018 CLINICAL DATA:  Central line placement EXAM: PORTABLE CHEST 1 VIEW COMPARISON:  05/03/2018 FINDINGS: Endotracheal tube 4 cm from carina. NG tube extends the stomach. Brachycephalic vein stent noted. No central venous line. IMPRESSION: Endotracheal tube and NG tube in good position Electronically Signed   By: Suzy Bouchard M.D.   On: 05/04/2018 13:15   Dg Chest Portable 1 View  Result Date: 05/03/2018 CLINICAL DATA:  Endotracheal tube placement EXAM: PORTABLE CHEST 1 VIEW COMPARISON:  02/07/2018 FINDINGS: Endotracheal tube tip is at the level of the clavicular heads. Orogastric tube tip and side port project over the gastric fundus. There is mild cardiomegaly. No focal airspace consolidation or pulmonary edema. IMPRESSION: 1. ET tube tip at the level of the clavicular heads. 2. OG tube tip and side port project over the gastric fundus. Electronically Signed   By: Ulyses Jarred M.D.   On: 05/03/2018 20:05   Dg Abd Portable 1v  Result Date: 05/04/2018 CLINICAL DATA:  Orogastric tube placement. EXAM: PORTABLE ABDOMEN - 1 VIEW COMPARISON:  Body CT 05/04/2018 FINDINGS: The bowel gas pattern is normal. Enteric catheter within the expected location of the gastric body. Vascular calcifications noted. Bilateral iliac catheter seen. IMPRESSION: Enteric catheter within the expected location of the gastric body. Electronically Signed   By: Fidela Salisbury M.D.   On: 05/04/2018 13:42   Ct Angio Abd/pel W/ And/or W/o  Result Date: 05/04/2018 CLINICAL DATA:  Altered mental status, gastrointestinal bleeding. Hematemesis and hematochezia with hypotension. Patient underwent hemodialysis today. EXAM: CTA ABDOMEN AND PELVIS WITHOUT AND WITH CONTRAST TECHNIQUE: Multidetector  CT imaging of the abdomen and pelvis was performed using the standard protocol during bolus administration of intravenous contrast. Multiplanar reconstructed images and MIPs were obtained and reviewed to evaluate the vascular anatomy. CONTRAST:  120m ISOVUE-370 IOPAMIDOL (ISOVUE-370) INJECTION 76% COMPARISON:  None. FINDINGS: VASCULAR Aorta: Moderate to marked calcific atherosclerosis of the thoracic aorta without aneurysmal the aneurysm or dissection. Celiac: Patent without significant stenosis. Scattered atherosclerotic calcified plaque noted. SMA: Atherosclerotic without aneurysm or dissection. Renals: Densely calcified at the origins of both renal arteries. No aneurysm. No definite dissection. IMA: Patent Inflow: Atherosclerotic and patent without significant stenosis, aneurysm or dissection. Proximal Outflow: Bilateral common femoral and visualized portions of the superficial and profunda femoral arteries are patent without evidence of aneurysm, dissection, vasculitis or significant stenosis. Moderate atherosclerosis noted bilaterally. Veins: Central line catheter in the left common iliac vein. Review of the MIP images confirms the above findings. NON-VASCULAR Lower chest: Cardiomegaly is noted with coronary arteriosclerosis. No pericardial effusion or thickening. Pulmonary consolidation at the left lung base likely representing atelectatic change. Gastric tube is noted within  the esophagus extending into the stomach. Hepatobiliary: Physiologic distention of the gallbladder without stones. No enhancing mass or biliary dilatation of the liver. Pancreas: Normal without ductal dilatation, mass or inflammation. Spleen: Normal size spleen.  No mass. Adrenals/Urinary Tract: Normal bilateral adrenal glands. Atrophic kidneys with renovascular calcifications. No renal mass nor obstructive uropathy. No hydroureteronephrosis. The urinary bladder is decompressed. Stomach/Bowel: A gastric tube is seen within the  physiologically distended stomach. No significant rugal thickening to suggest gastritis. The duodenal bulb, sweep and ligament Treitz are normal. No significant bowel distention or inflammation. No obstruction is noted. The colon is physiologically distended with stool. Mild diffuse mural thickening along the descending and sigmoid colon is likely due to underdistention and less likely inflammatory change. The appendix is nondistended and unremarkable. Lymphatic: No lymphadenopathy by CT size criteria. Reproductive: Mildly enlarged prostate measuring 4.7 x 4.2 cm on series 6/196. Other: Mild soft tissue anasarca.  No free air nor free fluid. Musculoskeletal: Diffuse sclerosis of the included axial and appendicular skeleton consistent with changes of chronic renal osteodystrophy. Degenerative disc disease of the thoracolumbar spine greatest from L2 through L5. IMPRESSION: VASCULAR 1. Atherosclerotic calcifications of the abdominal aorta without aneurysm or dissection. Patent atherosclerotic branch vessels as above. 2. No evidence of active hemorrhage. NON-VASCULAR 1. No CT findings to explain the patient's hemoptysis nor hematochezia. 2. Mild soft tissue anasarca. 3. Cardiomegaly with coronary arteriosclerosis. Pulmonary consolidation medially at left base. 4. Atrophic bilateral kidneys. Electronically Signed   By: Ashley Royalty M.D.   On: 05/04/2018 01:11   Medications:  I have reviewed the patient's current medications. Prior to Admission:  Medications Prior to Admission  Medication Sig Dispense Refill Last Dose   acetaminophen (TYLENOL) 325 MG tablet Take 2 tablets (650 mg total) by mouth every 6 (six) hours as needed for mild pain (or Fever >/= 101). (Patient taking differently: Take 500 mg by mouth every 8 (eight) hours as needed for mild pain (or Fever >/= 101). )   Past Week at Unknown time   albuterol (PROVENTIL HFA;VENTOLIN HFA) 108 (90 Base) MCG/ACT inhaler Inhale 2 puffs into the lungs every 4  (four) hours as needed for wheezing or shortness of breath.    prn at prn   amiodarone (PACERONE) 200 MG tablet Take 1 tablet (200 mg total) by mouth daily. 30 tablet 6 Past Week at Unknown time   aspirin EC 81 MG tablet Take 1 tablet (81 mg total) by mouth daily. 150 tablet 2    atorvastatin (LIPITOR) 40 MG tablet Take 1 tablet (40 mg total) by mouth daily. (Patient taking differently: Take 40 mg by mouth every evening. ) 30 tablet 1 Past Week at Unknown time   calcium acetate (PHOSLO) 667 MG capsule Take 1,334-2,001 mg by mouth 3 (three) times daily with meals. take 3 capsules (2064m) by mouth three times daily before each meal, and 2 capsules (6641m by mouth with snacks twice daily   Past Week at Unknown time   cinacalcet (SENSIPAR) 90 MG tablet Take 60 mg by mouth daily.    Past Week at Unknown time   cyclobenzaprine (FLEXERIL) 5 MG tablet Take 5 mg by mouth daily.   prn at prn   diphenhydrAMINE (BENADRYL) 25 mg capsule Take 1 capsule (25 mg total) by mouth every 6 (six) hours as needed for itching or allergies. 30 capsule 0    docusate sodium (COLACE) 100 MG capsule Take 1 capsule (100 mg total) by mouth 2 (two) times daily as needed  for mild constipation. 10 capsule 0    ferrous sulfate 325 (65 FE) MG EC tablet Take 1 tablet (325 mg total) by mouth 2 (two) times daily. 60 tablet 2    ipratropium (ATROVENT HFA) 17 MCG/ACT inhaler Inhale 2 puffs into the lungs 4 (four) times daily as needed for wheezing.    prn at prn   lidocaine (LIDODERM) 5 % Place 1 patch onto the skin daily. Remove & Discard patch within 12 hours or as directed by MD 15 patch 0    lidocaine-prilocaine (EMLA) cream Apply 1 application topically as needed (dialysis access).   prn at prn   multivitamin (RENA-VIT) TABS tablet Take 1 tablet by mouth at bedtime. 90 tablet 0    nitroGLYCERIN (NITROSTAT) 0.4 MG SL tablet Place 0.4 mg under the tongue every 5 (five) minutes as needed for chest pain.   prn at prn    Nutritional Supplements (FEEDING SUPPLEMENT, NEPRO CARB STEADY,) LIQD Take 237 mLs by mouth 2 (two) times daily between meals. 60 Can 0    omeprazole (PRILOSEC) 40 MG capsule Take 1 capsule (40 mg total) by mouth 2 (two) times daily. 60 capsule 1    OXYGEN Inhale into the lungs daily as needed. Wears O2 at 2L [place] at home at night and as needed in the day   Taking   traMADol (ULTRAM) 50 MG tablet Take 1 tablet (50 mg total) by mouth every 6 (six) hours as needed for moderate pain or severe pain. 15 tablet 0    Scheduled:  chlorhexidine gluconate (MEDLINE KIT)  15 mL Mouth Rinse BID   cyanocobalamin  1,000 mcg Subcutaneous Once   mouth rinse  15 mL Mouth Rinse 10 times per day   pantoprazole (PROTONIX) IV  40 mg Intravenous Q12H   Continuous:  norepinephrine (LEVOPHED) Adult infusion 4 mcg/min (05/05/18 1255)   pureflow 2,000 mL/hr at 05/04/18 2148   LYY:[id number] **OR** acetaminophen, heparin, ipratropium-albuterol, LORazepam, [DISCONTINUED] ondansetron **OR** ondansetron (ZOFRAN) IV, polyethylene glycol Anti-infectives (From admission, onward)   Start     Dose/Rate Route Frequency Ordered Stop   05/04/18 0515  Ampicillin-Sulbactam (UNASYN) 3 g in sodium chloride 0.9 % 100 mL IVPB  Status:  Discontinued     3 g 200 mL/hr over 30 Minutes Intravenous Every 12 hours 05/04/18 0502 05/04/18 1216     Scheduled Meds:  chlorhexidine gluconate (MEDLINE KIT)  15 mL Mouth Rinse BID   cyanocobalamin  1,000 mcg Subcutaneous Once   mouth rinse  15 mL Mouth Rinse 10 times per day   pantoprazole (PROTONIX) IV  40 mg Intravenous Q12H   Continuous Infusions:  norepinephrine (LEVOPHED) Adult infusion 4 mcg/min (05/05/18 1255)   pureflow 2,000 mL/hr at 05/04/18 2148   PRN Meds:.acetaminophen **OR** acetaminophen, heparin, ipratropium-albuterol, LORazepam, [DISCONTINUED] ondansetron **OR** ondansetron (ZOFRAN) IV, polyethylene glycol   Assessment: Active Problems:   GIB  (gastrointestinal bleeding)   Gastric polyp   Duodenal ulcer hemorrhage   Hematemesis with nausea  EGD on 05/04/2018, duodenal ulcer with visible vessel in the bulb status post cautery and clip placed adjacent to the ulcer.  Patient is extubated today.  No further bleeding events noted.  Hemoglobin is stable  Plan: Continue Protonix 40 mg IV twice daily and transition to p.o. twice daily when passes swallow eval He has H. pylori stool antigen positive, will need to be on triple therapy that was prescribed as outpatient after discharge Has B12 and folate deficiency, recommend to start oral B12 and folate when able  to start p.o. Follow-up with Dr. Vicente Males as outpatient in 2 to 3 weeks after discharge  GI will sign off at this time, please call us back with questions or concerns   LOS: 2 days   Kamron Vanwyhe 05/05/2018, 3:52 PM

## 2018-05-06 ENCOUNTER — Inpatient Hospital Stay: Payer: Medicare Other

## 2018-05-06 DIAGNOSIS — G9340 Encephalopathy, unspecified: Secondary | ICD-10-CM

## 2018-05-06 LAB — RENAL FUNCTION PANEL
ALBUMIN: 2.2 g/dL — AB (ref 3.5–5.0)
Albumin: 2.2 g/dL — ABNORMAL LOW (ref 3.5–5.0)
Anion gap: 8 (ref 5–15)
Anion gap: 8 (ref 5–15)
BUN: 24 mg/dL — ABNORMAL HIGH (ref 8–23)
BUN: 21 mg/dL (ref 8–23)
CALCIUM: 7.8 mg/dL — AB (ref 8.9–10.3)
CO2: 25 mmol/L (ref 22–32)
CO2: 24 mmol/L (ref 22–32)
Calcium: 7.9 mg/dL — ABNORMAL LOW (ref 8.9–10.3)
Chloride: 102 mmol/L (ref 98–111)
Chloride: 103 mmol/L (ref 98–111)
Creatinine, Ser: 2.15 mg/dL — ABNORMAL HIGH (ref 0.61–1.24)
Creatinine, Ser: 1.92 mg/dL — ABNORMAL HIGH (ref 0.61–1.24)
GFR calc Af Amer: 36 mL/min — ABNORMAL LOW (ref >60)
GFR calc Af Amer: 42 mL/min — ABNORMAL LOW (ref >60)
GFR calc non Af Amer: 31 mL/min — ABNORMAL LOW (ref >60)
GFR calc non Af Amer: 36 mL/min — ABNORMAL LOW (ref >60)
Glucose, Bld: 124 mg/dL — ABNORMAL HIGH (ref 70–99)
Glucose, Bld: 107 mg/dL — ABNORMAL HIGH (ref 70–99)
Phosphorus: 4.2 mg/dL (ref 2.5–4.6)
Phosphorus: 3.7 mg/dL (ref 2.5–4.6)
Potassium: 3.7 mmol/L (ref 3.5–5.1)
Potassium: 3.7 mmol/L (ref 3.5–5.1)
Sodium: 135 mmol/L (ref 135–145)
Sodium: 135 mmol/L (ref 135–145)

## 2018-05-06 LAB — CBC
HCT: 25.5 % — ABNORMAL LOW (ref 39.0–52.0)
Hemoglobin: 8.2 g/dL — ABNORMAL LOW (ref 13.0–17.0)
MCH: 29.8 pg (ref 26.0–34.0)
MCHC: 32.2 g/dL (ref 30.0–36.0)
MCV: 92.7 fL (ref 80.0–100.0)
Platelets: 107 10*3/uL — ABNORMAL LOW (ref 150–400)
RBC: 2.75 MIL/uL — ABNORMAL LOW (ref 4.22–5.81)
RDW: 18.4 % — AB (ref 11.5–15.5)
WBC: 11.5 10*3/uL — ABNORMAL HIGH (ref 4.0–10.5)
nRBC: 0.5 % — ABNORMAL HIGH (ref 0.0–0.2)

## 2018-05-06 LAB — GLUCOSE, CAPILLARY
GLUCOSE-CAPILLARY: 84 mg/dL (ref 70–99)
Glucose-Capillary: 112 mg/dL — ABNORMAL HIGH (ref 70–99)
Glucose-Capillary: 59 mg/dL — ABNORMAL LOW (ref 70–99)
Glucose-Capillary: 65 mg/dL — ABNORMAL LOW (ref 70–99)
Glucose-Capillary: 70 mg/dL (ref 70–99)
Glucose-Capillary: 74 mg/dL (ref 70–99)
Glucose-Capillary: 78 mg/dL (ref 70–99)

## 2018-05-06 LAB — TYPE AND SCREEN
ABO/RH(D): O POS
Antibody Screen: NEGATIVE
UNIT DIVISION: 0
UNIT DIVISION: 0
Unit division: 0
Unit division: 0
Unit division: 0
Unit division: 0
Unit division: 0

## 2018-05-06 LAB — BPAM RBC
BLOOD PRODUCT EXPIRATION DATE: 202003242359
Blood Product Expiration Date: 202003242359
Blood Product Expiration Date: 202003282359
Blood Product Expiration Date: 202003282359
Blood Product Expiration Date: 202004022359
Blood Product Expiration Date: 202004062359
Blood Product Expiration Date: 202004062359
ISSUE DATE / TIME: 202003111903
ISSUE DATE / TIME: 202003112210
ISSUE DATE / TIME: 202003120600
ISSUE DATE / TIME: 202003131042
ISSUE DATE / TIME: 202003131135
ISSUE DATE / TIME: 202003111903
UNIT TYPE AND RH: 5100
UNIT TYPE AND RH: 5100
Unit Type and Rh: 5100
Unit Type and Rh: 5100
Unit Type and Rh: 5100
Unit Type and Rh: 9500
Unit Type and Rh: 9500

## 2018-05-06 LAB — MAGNESIUM
Magnesium: 2 mg/dL (ref 1.7–2.4)
Magnesium: 1.9 mg/dL (ref 1.7–2.4)

## 2018-05-06 LAB — PREPARE RBC (CROSSMATCH)

## 2018-05-06 MED ORDER — HALOPERIDOL LACTATE 5 MG/ML IJ SOLN: 1.0000 mg | Freq: Once | INTRAMUSCULAR | Status: DC

## 2018-05-06 MED ORDER — DEXTROSE 50 % IV SOLN
1.0000 | Freq: Once | INTRAVENOUS | Status: AC
  Administered 2018-05-06: 50 mL via INTRAVENOUS
  Filled 2018-05-06: qty 50

## 2018-05-06 MED ORDER — EPOETIN ALFA 10000 UNIT/ML IJ SOLN
4000.0000 [IU] | INTRAMUSCULAR | Status: DC
  Administered 2018-05-08: 4000 [IU] via INTRAVENOUS

## 2018-05-06 MED ORDER — DEXTROSE 50 % IV SOLN
INTRAVENOUS | Status: AC
  Administered 2018-05-06: 50 mL via INTRAVENOUS
  Filled 2018-05-06: qty 50

## 2018-05-06 MED ORDER — DEXTROSE 50 % IV SOLN
1.0000 | Freq: Once | INTRAVENOUS | Status: AC
  Administered 2018-05-06: 50 mL via INTRAVENOUS

## 2018-05-06 MED ORDER — HALOPERIDOL LACTATE 5 MG/ML IJ SOLN
1.0000 mg | Freq: Four times a day (QID) | INTRAMUSCULAR | Status: DC | PRN
  Administered 2018-05-07: 1 mg via INTRAVENOUS
  Filled 2018-05-06: qty 1
  Filled 2018-05-06: qty 0.2
  Filled 2018-05-06: qty 1

## 2018-05-06 NOTE — Progress Notes (Signed)
PCCM PROGRESS NOTE   PT PROFILE: 63 y.o. male with history of ESRD on HD, type 2 diabetes, recently diagnosed duodenal ulcer, CHF presented to [facility] ED evening 03/11 with coffee ground emesis and AMS. Intubated in ED for airway protection.  Received multiple units PRBCs, developed severe hyperkalemia prompting initiation of CRRT.  MAJOR EVENTS/TEST RESULTS: 03/11 admission as noted above 03/12 CTA abd/pelvis: No acute findings 03/12 CT head: No acute findings 03/12 CRRT initiated.  03/12 Norepi initiated for sedation induced hypotension 03/12 EGD: Nonbleeding duodenal ulcer with visible vessel, cauterized and clip placed 03/13 Extubated 03/14 Tolerating extubation well.  Intermittently agitated with delirium  INDWELLING DEVICES: ETT 03/11 >> 03/13 R femoral HD cath 03/12 >>  L femoral CVL 03/12 >>   MICRO DATA: MRSA PCR 3/11 >> negative  ANTIMICROBIALS: Amp-sulbactam 03/11 >> 3/12   SUBJ: Tolerating extubation on [place] O2.  Poorly oriented.  Follows some commands.  Intermittently agitated  Vitals:   05/06/18 0700 05/06/18 0800 05/06/18 0900 05/06/18 1000  BP: 92/63 102/62 92/63 99/69   Pulse: 74 75 72 72  Resp: (!) 29 (!) 28  (!) 26  Temp:  98.2 F (36.8 C)    TempSrc:  Axillary    SpO2: 100% 100% 99% 100%  Weight:      Height:       RA  EXAM:  Gen: No distress HEENT: NCAT, sclerae white Neck: JVP not visualized Lungs: Chest clear anteriorly Cardiovascular: Regular, no M Abdomen: Soft, NT, NABS Ext: No C/C/E Neuro: No focal deficits Skin: Limited exam, no lesions noted   DATA:   BMP Latest Ref Rng & Units 05/06/2018 05/06/2018 05/05/2018  Glucose 70 - 99 mg/dL 107(H) 124(H) 153(H)  BUN 8 - 23 mg/dL 21 24(H) 26(H)  Creatinine 0.61 - 1.24 mg/dL 1.92(H) 2.15(H) 2.16(H)  Sodium 135 - 145 mmol/L 135 135 135  Potassium 3.5 - 5.1 mmol/L 3.7 3.7 3.7  Chloride 98 - 111 mmol/L 103 102 102  CO2 22 - 32 mmol/L 24 25 24   Calcium 8.9 - 10.3 mg/dL 7.9(L) 7.8(L) 7.7(L)     CBC Latest Ref Rng & Units 05/06/2018 05/05/2018 05/05/2018  WBC 4.0 - 10.5 K/uL 11.5(H) 17.8(H) -  Hemoglobin 13.0 - 17.0 g/dL 8.2(L) 8.6(L) 8.6(L)  Hematocrit 39.0 - 52.0 % 25.5(L) 26.1(L) 26.0(L)  Platelets 150 - 400 K/uL 107(L) 108(L) -    CXR: Cardiomegaly, mild edema pattern  I have personally reviewed all chest radiographs reported above including CXRs and CT chest unless otherwise indicated  IMPRESSION:   VDRF, resolved Hypovolemic shock, resolved Acute blood loss anemia ESRD Hyperkalemia, resolved UGIB due to duodenal ulcer, not actively bleeding History of substance abuse Acute encephalopathy/delirium   PLAN:  Continue supplemental oxygen as needed Discussed with nephrology.  Discontinue CRRT today Monitor BMET intermittently Monitor I/Os Correct electrolytes as indicated DVT prophylaxis: SCDs Monitor CBC intermittently Transfuse per usual guidelines Continue low-dose lorazepam and haloperidol as needed Advance activity and nutrition when able Will assess for transfer out of ICU/SDU later today   Merton Border, MD PCCM service Mobile [phone] Pager [phone] 05/06/2018 1:08 PM

## 2018-05-06 NOTE — Progress Notes (Signed)
[facility], [place] 05/06/18  Subjective:   Critically ill, extubated yesterday, agitated this morning Keeps moaning that he does not like it.  Denies pain Off pressors   Objective:  Vital signs in last 24 hours:  Temp:  [96.3 F (35.7 C)-98 F (36.7 C)] 98 F (36.7 C) (03/14 0000) Pulse Rate:  [61-80] 75 (03/14 0600) Resp:  [14-37] 32 (03/14 0600) BP: (78-128)/(51-71) 109/68 (03/14 0600) SpO2:  [86 %-100 %] 100 % (03/14 0600) FiO2 (%):  [28 %] 28 % (03/13 1035)  Weight change:  Filed Weights   05/03/18 1752 05/03/18 2100 05/05/18 0030  Weight: 74.5 kg 71.4 kg 77.7 kg    Intake/Output:    Intake/Output Summary (Last 24 hours) at 05/06/2018 0855 Last data filed at 05/06/2018 0600 Gross per 24 hour  Intake 693.15 ml  Output 786 ml  Net -92.85 ml     Physical Exam: General:  Critically ill-appearing  HEENT  moist oral mucous membranes  Neck  supple, no masses  Pulm/lungs  oxygen supplementation by nasal cannula  CVS/Heart  tachycardic, irregular  Abdomen:   Soft, nontender  Extremities:  No edema  Neurologic:  Sedated   Skin:  no acute rashes  Access:  Left upper extremity AV fistula, right femoral temporary catheter       Basic Metabolic Panel:  Recent Labs  Lab 05/05/18 0820 05/05/18 1231 05/05/18 1611 05/05/18 2043 05/06/18 0048 05/06/18 0522  NA 135  --  135 135 135 135  K 4.1  --  3.9 3.7 3.7 3.7  CL 102  --  102 102 102 103  CO2 24  --  23 24 25 24   GLUCOSE 161*  --  108* 153* 124* 107*  BUN 35*  --  30* 26* 24* 21  CREATININE 2.50*  --  2.49* 2.16* 2.15* 1.92*  CALCIUM 7.9*  --  7.8* 7.7* 7.8* 7.9*  MG 1.7 1.8 1.8 1.9 2.0 1.9  PHOS 5.0*  --  4.9* 4.2 4.2 3.7     CBC: Recent Labs  Lab 05/03/18 1747  05/04/18 0345 05/04/18 0815 05/04/18 1646 05/05/18 0103 05/05/18 0508 05/06/18 0522  WBC 8.8  --  16.3*  --   --   --  17.8* 11.5*  NEUTROABS 6.1  --   --   --   --   --   --   --   HGB 5.5*   < > 8.0*  9.0*  9.0* 8.7* 8.6* 8.6* 8.2*  HCT 18.5*   < > 24.4* 27.7* 26.3* 26.0* 26.1* 25.5*  MCV 100.0  --  91.7  --   --   --  90.9 92.7  PLT 183  --  169  --   --   --  108* 107*   < > = values in this interval not displayed.      Lab Results  Component Value Date   HEPBSAG Negative 04/18/2018   HEPBSAB Reactive 02/15/2017      Microbiology:  Recent Results (from the past 240 hour(s))  MRSA PCR Screening     Status: None   Collection Time: 05/03/18  8:59 PM  Result Value Ref Range Status   MRSA by PCR NEGATIVE NEGATIVE Final    Comment:        The GeneXpert MRSA Assay (FDA approved for NASAL specimens only), is one component of a comprehensive MRSA colonization surveillance program. It is not intended to diagnose MRSA infection nor to guide or monitor treatment  for MRSA infections. Performed at [facility], [address] [place], [place] [address]     Coagulation Studies: No results for input(s): LABPROT, INR in the last 72 hours.  Urinalysis: No results for input(s): COLORURINE, LABSPEC, PHURINE, GLUCOSEU, HGBUR, BILIRUBINUR, KETONESUR, PROTEINUR, UROBILINOGEN, NITRITE, LEUKOCYTESUR in the last 72 hours.  Invalid input(s): APPERANCEUR    Imaging: Dg Chest Port 1 View  Result Date: 05/06/2018 CLINICAL DATA:  Respiratory failure EXAM: PORTABLE CHEST 1 VIEW COMPARISON:  05/05/2018 FINDINGS: Cardiomegaly with suspected mild interstitial edema, increased. Layering small bilateral pleural effusions, increased. No pneumothorax. Left vascular stent. IMPRESSION: Cardiomegaly with suspected mild interstitial edema and small bilateral pleural effusions, increased. Electronically Signed   By: Julian Hy M.D.   On: 05/06/2018 05:16   Dg Chest Port 1 View  Result Date: 05/05/2018 CLINICAL DATA:  Respiratory failure EXAM: PORTABLE CHEST 1 VIEW COMPARISON:  Yesterday FINDINGS: Endotracheal tube tip just below the clavicular heads. The orogastric tube reaches the  stomach. Bilateral lung opacity that is increased. No Kerley lines, effusion, or pneumothorax. Left brachiocephalic venous stent. IMPRESSION: 1. Unremarkable hardware positioning. 2. Increased lung opacity from atelectasis or pneumonia. 3. Cardiomegaly. Electronically Signed   By: Monte Fantasia M.D.   On: 05/05/2018 06:42   Portable Chest Xray  Result Date: 05/04/2018 CLINICAL DATA:  Central line placement EXAM: PORTABLE CHEST 1 VIEW COMPARISON:  05/03/2018 FINDINGS: Endotracheal tube 4 cm from carina. NG tube extends the stomach. Brachycephalic vein stent noted. No central venous line. IMPRESSION: Endotracheal tube and NG tube in good position Electronically Signed   By: Suzy Bouchard M.D.   On: 05/04/2018 13:15   Dg Abd Portable 1v  Result Date: 05/04/2018 CLINICAL DATA:  Orogastric tube placement. EXAM: PORTABLE ABDOMEN - 1 VIEW COMPARISON:  Body CT 05/04/2018 FINDINGS: The bowel gas pattern is normal. Enteric catheter within the expected location of the gastric body. Vascular calcifications noted. Bilateral iliac catheter seen. IMPRESSION: Enteric catheter within the expected location of the gastric body. Electronically Signed   By: Fidela Salisbury M.D.   On: 05/04/2018 13:42     Medications:   . dextrose 25 mL/hr at 05/06/18 0600  . norepinephrine (LEVOPHED) Adult infusion 1 mcg/min (05/06/18 0600)   . haloperidol lactate  1 mg Intravenous Once  . pantoprazole (PROTONIX) IV  40 mg Intravenous Q12H   acetaminophen **OR** acetaminophen, ipratropium-albuterol, LORazepam, [DISCONTINUED] ondansetron **OR** ondansetron (ZOFRAN) IV, polyethylene glycol  Assessment/ Plan:  63 y.o. male withend stage renal disease on hemodialysis, hypertension, hepatitis C, anemia, seizure disorder, COPD/tobacco abuse,substance abuse and alcohol abuse,secondary hyperparathyroidism, chronic systolic CHF  [facility] M[facility] [address]Left arm AVG. 195 minutes/73.5 kg  1.  End-stage renal disease  with severe hyperkalemia Due to hypotension and requirement for pressors, patient was started on CRRT which he tolerated well..  About 700 cc of fluid was removed overnight CRRT to be discontinued this morning.  We will attempt intermittent hemodialysis on Monday Potassium level normalized now  2.  Severe anemia of chronic kidney disease and GI bleed Recent upper endoscopy showed 1 cm ulcer in the duodenal bulb and H. pylori antigen positive.  Patient presented again with hematemesis and hematochezia, hypotension and altered mental status.  S/p 3 multiple units of blood transfusion. EGD yesterday showed that the esophagus was normal, 1 nonbleeding duodenal ulcer with visible vessel was found.  Coagulation for hemostasis was done and a hemostatic clip was placed by endoscopy. -Hemoglobin 8.2 -Epogen therapy to be scheduled with hemodialysis  3.  Cardiac July 2019 echo moderate to severe mitral regurgitation, calcified mitral valve, 35 to 40%, pulmonary hypertension -pressure greater than 65 mmHg Off pressors  4.  Acute respiratory failure Extubated March 13. -Currently on nasal cannula oxygen  5.  Agitation ?  Alcohol withdrawal          LOS: 3 Krystelle Prashad Candiss Norse 3/14/20208:55 AM  [place], [place]  Note: This note was prepared with Dragon dictation. Any transcription errors are unintentional

## 2018-05-06 NOTE — Progress Notes (Signed)
[name] at [place] NAME: [name]    MR#:  [id number]  DATE OF BIRTH:  03-24-1955  SUBJECTIVE:   Patient extubated however received Ativan for signs of withdrawal  REVIEW OF SYSTEMS:  Unable to obtain patient received Ativan and is sedated   Tolerating Diet: npo      DRUG ALLERGIES:   Allergies  Allergen Reactions  . Sulfa Antibiotics Rash and Shortness Of Breath    VITALS:  Blood pressure 99/69, pulse 72, temperature 98.2 F (36.8 C), temperature source Axillary, resp. rate (!) 26, height 6' (1.829 m), weight 77.7 kg, SpO2 100 %.  PHYSICAL EXAMINATION:  Constitutional: Appears sedated and chronically ill-appearing HENT: Normocephalic. .  Eyes:  no scleral icterus.  Neck:  No JVD. No tracheal deviation. CVS: RRR, S1/S2 +, no murmurs, no gallops, no carotid bruit.  Pulmonary: Bilateral coarse breath sounds   abdominal: Soft. BS +,  no distension, tenderness, rebound or guarding.  Musculoskeletal: Normal range of motion. No edema and no tenderness.  Neuro: Sedated wearing mitts   skin: Skin is warm and dry. No rash noted. Psychiatric: Sedated.      LABORATORY PANEL:   CBC Recent Labs  Lab 05/06/18 0522  WBC 11.5*  HGB 8.2*  HCT 25.5*  PLT 107*   ------------------------------------------------------------------------------------------------------------------  Chemistries  Recent Labs  Lab 05/03/18 1747  05/06/18 0522  NA 142   < > 135  K 4.7   < > 3.7  CL 113*   < > 103  CO2 23   < > 24  GLUCOSE 98   < > 107*  BUN 38*   < > 21  CREATININE 3.49*   < > 1.92*  CALCIUM 6.8*   < > 7.9*  MG  --    < > 1.9  AST 20  --   --   ALT 14  --   --   ALKPHOS 61  --   --   BILITOT 0.6  --   --    < > = values in this interval not displayed.   ------------------------------------------------------------------------------------------------------------------  Cardiac Enzymes No results for input(s):  TROPONINI in the last 168 hours. ------------------------------------------------------------------------------------------------------------------  RADIOLOGY:  Dg Chest Port 1 View  Result Date: 05/06/2018 CLINICAL DATA:  Respiratory failure EXAM: PORTABLE CHEST 1 VIEW COMPARISON:  05/05/2018 FINDINGS: Cardiomegaly with suspected mild interstitial edema, increased. Layering small bilateral pleural effusions, increased. No pneumothorax. Left vascular stent. IMPRESSION: Cardiomegaly with suspected mild interstitial edema and small bilateral pleural effusions, increased. Electronically Signed   By: Julian Hy M.D.   On: 05/06/2018 05:16   Dg Chest Port 1 View  Result Date: 05/05/2018 CLINICAL DATA:  Respiratory failure EXAM: PORTABLE CHEST 1 VIEW COMPARISON:  Yesterday FINDINGS: Endotracheal tube tip just below the clavicular heads. The orogastric tube reaches the stomach. Bilateral lung opacity that is increased. No Kerley lines, effusion, or pneumothorax. Left brachiocephalic venous stent. IMPRESSION: 1. Unremarkable hardware positioning. 2. Increased lung opacity from atelectasis or pneumonia. 3. Cardiomegaly. Electronically Signed   By: Monte Fantasia M.D.   On: 05/05/2018 06:42     ASSESSMENT AND PLAN:   63 year old male with end-stage renal disease on hemodialysis, COPD, EtOH abuse and recent discharge from the hospital with GI bleed who presented with massive GI bleed.  1.  Acute upper GI bleed: Patient underwent EGD EGD on 05/04/2018, duodenal ulcer with visible vessel in the bulb status post cautery and clip placed adjacent to the  ulcer Continue Protonix twice daily and transition to p.o. twice daily when patient passes swallow evaluation He has H. pylori stool antigen positive which will need to be treated on triple therapy that was prescribed as outpatient after discharge.  2.  Acute on chronic blood loss anemia: Patient received multiple blood transfusions.  D66 and folic acid  are low and will need to be started p.o. when able. Epogen during dialysis 3.  End-stage renal disease with severe hyperkalemia: Due to hypotension patient was on CRRT.  He is off pressors.  CRRT is being discontinued this morning as per nephrology.  Continue intermittent hemodialysis.    4.  Hyperkalemia: This is normalized  5.  Acute hypoxic respiratory failure with inability to protect airway: Patient intubated on admission and now extubated. Continue Unasyn for aspiration pneumonia  6.  EtOH abuse: Patient is on CIWA protocol Watch for DTs. Patient will need outpatient palliative care services upon discharge  Management plans discussed with nursing  CODE STATUS: full  TOTAL TIME TAKING CARE OF THIS PATIENT: 29 minutes.     POSSIBLE D/C ??, DEPENDING ON CLINICAL CONDITION.   [name] M.D on 05/06/2018 at 1:00 PM  Between 7am to 6pm - Pager - [phone] After 6pm go to [email or web address] - password [email or web address] [facility]  Office  [phone]  CC: Primary care physician; Thomas Coffin, MD  Note: This dictation was prepared with Dragon dictation along with smaller phrase technology. Any transcriptional errors that result from this process are unintentional.

## 2018-05-06 NOTE — Progress Notes (Signed)
PT Cancellation Note  Patient Details Name: [name] MRN: [id number] DOB: [birth year]   Cancelled Treatment:    Reason Eval/Treat Not Completed: Fatigue/lethargy limiting ability to participate.  Lethargic and then agitated.  Hold per nsg until tomorrow to see how pt is progressing.   Ramond Dial 05/06/2018, 3:31 PM  Mee Hives, PT MS Acute Rehab Dept. Number: [place] and [place]

## 2018-05-07 LAB — GLUCOSE, CAPILLARY
Glucose-Capillary: 105 mg/dL — ABNORMAL HIGH (ref 70–99)
Glucose-Capillary: 114 mg/dL — ABNORMAL HIGH (ref 70–99)
Glucose-Capillary: 137 mg/dL — ABNORMAL HIGH (ref 70–99)
Glucose-Capillary: 60 mg/dL — ABNORMAL LOW (ref 70–99)
Glucose-Capillary: 61 mg/dL — ABNORMAL LOW (ref 70–99)
Glucose-Capillary: 70 mg/dL (ref 70–99)
Glucose-Capillary: 76 mg/dL (ref 70–99)
Glucose-Capillary: 87 mg/dL (ref 70–99)

## 2018-05-07 LAB — BASIC METABOLIC PANEL
Anion gap: 6 (ref 5–15)
BUN: 28 mg/dL — ABNORMAL HIGH (ref 8–23)
CO2: 26 mmol/L (ref 22–32)
Calcium: 7.4 mg/dL — ABNORMAL LOW (ref 8.9–10.3)
Chloride: 104 mmol/L (ref 98–111)
Creatinine, Ser: 3.32 mg/dL — ABNORMAL HIGH (ref 0.61–1.24)
GFR calc Af Amer: 22 mL/min — ABNORMAL LOW (ref >60)
GFR calc non Af Amer: 19 mL/min — ABNORMAL LOW (ref >60)
GLUCOSE: 95 mg/dL (ref 70–99)
Potassium: 3.5 mmol/L (ref 3.5–5.1)
Sodium: 136 mmol/L (ref 135–145)

## 2018-05-07 LAB — CBC
HCT: 23.9 % — ABNORMAL LOW (ref 39.0–52.0)
Hemoglobin: 7.5 g/dL — ABNORMAL LOW (ref 13.0–17.0)
MCH: 29.8 pg (ref 26.0–34.0)
MCHC: 31.4 g/dL (ref 30.0–36.0)
MCV: 94.8 fL (ref 80.0–100.0)
Platelets: 104 10*3/uL — ABNORMAL LOW (ref 150–400)
RBC: 2.52 MIL/uL — ABNORMAL LOW (ref 4.22–5.81)
RDW: 18.3 % — ABNORMAL HIGH (ref 11.5–15.5)
WBC: 8.8 10*3/uL (ref 4.0–10.5)
nRBC: 0.2 % (ref 0.0–0.2)

## 2018-05-07 LAB — PHOSPHORUS: PHOSPHORUS: 4.5 mg/dL (ref 2.5–4.6)

## 2018-05-07 LAB — MAGNESIUM: MAGNESIUM: 2 mg/dL (ref 1.7–2.4)

## 2018-05-07 MED ORDER — CHLORHEXIDINE GLUCONATE CLOTH 2 % EX PADS
6.0000 | MEDICATED_PAD | Freq: Every day | CUTANEOUS | Status: DC
  Administered 2018-05-08: 6 via TOPICAL

## 2018-05-07 MED ORDER — CHLORHEXIDINE GLUCONATE 0.12 % MT SOLN
15.0000 mL | Freq: Two times a day (BID) | OROMUCOSAL | Status: DC
  Administered 2018-05-07 – 2018-05-09 (×5): 15 mL via OROMUCOSAL
  Filled 2018-05-07 (×5): qty 15

## 2018-05-07 MED ORDER — LORAZEPAM 2 MG/ML IJ SOLN: 0.5000 mg | INTRAMUSCULAR | Status: DC | PRN

## 2018-05-07 MED ORDER — ORAL CARE MOUTH RINSE
15.0000 mL | Freq: Two times a day (BID) | OROMUCOSAL | Status: DC
  Administered 2018-05-07 – 2018-05-08 (×3): 15 mL via OROMUCOSAL

## 2018-05-07 MED ORDER — DEXTROSE 50 % IV SOLN
12.5000 g | INTRAVENOUS | Status: AC
  Administered 2018-05-07: 12.5 g via INTRAVENOUS
  Filled 2018-05-07: qty 50

## 2018-05-07 MED ORDER — DEXTROSE 50 % IV SOLN
INTRAVENOUS | Status: AC
  Administered 2018-05-07: 50 mL
  Filled 2018-05-07: qty 50

## 2018-05-07 NOTE — Progress Notes (Signed)
[place] at [place] NAME: [name]    MR#:  [id number]  DATE OF BIRTH:  01-19-1956  SUBJECTIVE:   Patient received Ativan late last night for agitation. Patient currently sleeping/sedated from Ativan REVIEW OF SYSTEMS:  Unable to obtain patient received Ativan and is sedated   Tolerating Diet: npo      DRUG ALLERGIES:   Allergies  Allergen Reactions  . Sulfa Antibiotics Rash and Shortness Of Breath    VITALS:  Blood pressure 100/75, pulse 69, temperature 98 F (36.7 C), temperature source Axillary, resp. rate 20, height 6' (1.829 m), weight 76.5 kg, SpO2 97 %.  PHYSICAL EXAMINATION:  Constitutional: Appears sedated and chronically ill-appearing HENT: Normocephalic. .  Eyes:  no scleral icterus.  Neck:  No JVD. No tracheal deviation. CVS: RRR, S1/S2 +, no murmurs, no gallops, no carotid bruit.  Pulmonary: Bilateral coarse breath sounds   abdominal: Soft. BS +,  no distension, tenderness, rebound or guarding.  Musculoskeletal: Normal range of motion. No edema and no tenderness.  Neuro: Sedated wearing mitts   skin: Skin is warm and dry. No rash noted. Psychiatric: Sedated.      LABORATORY PANEL:   CBC Recent Labs  Lab 05/07/18 0458  WBC 8.8  HGB 7.5*  HCT 23.9*  PLT 104*   ------------------------------------------------------------------------------------------------------------------  Chemistries  Recent Labs  Lab 05/03/18 1747  05/07/18 0458  NA 142   < > 136  K 4.7   < > 3.5  CL 113*   < > 104  CO2 23   < > 26  GLUCOSE 98   < > 95  BUN 38*   < > 28*  CREATININE 3.49*   < > 3.32*  CALCIUM 6.8*   < > 7.4*  MG  --    < > 2.0  AST 20  --   --   ALT 14  --   --   ALKPHOS 61  --   --   BILITOT 0.6  --   --    < > = values in this interval not displayed.   ------------------------------------------------------------------------------------------------------------------  Cardiac Enzymes No  results for input(s): TROPONINI in the last 168 hours. ------------------------------------------------------------------------------------------------------------------  RADIOLOGY:  Dg Chest Port 1 View  Result Date: 05/06/2018 CLINICAL DATA:  Respiratory failure EXAM: PORTABLE CHEST 1 VIEW COMPARISON:  05/05/2018 FINDINGS: Cardiomegaly with suspected mild interstitial edema, increased. Layering small bilateral pleural effusions, increased. No pneumothorax. Left vascular stent. IMPRESSION: Cardiomegaly with suspected mild interstitial edema and small bilateral pleural effusions, increased. Electronically Signed   By: Julian Hy M.D.   On: 05/06/2018 05:16     ASSESSMENT AND PLAN:   63 year old male with end-stage renal disease on hemodialysis, COPD, EtOH abuse and recent discharge from the hospital with GI bleed who presented with massive GI bleed.  1.  Acute upper GI bleed: Patient underwent EGD EGD on 05/04/2018, duodenal ulcer with visible vessel in the bulb status post cautery and clip placed adjacent to the ulcer Continue Protonix twice daily and transition to p.o. twice daily when patient passes swallow evaluation He has H. pylori stool antigen positive which will need to be treated on triple therapy that was prescribed as outpatient after discharge.  2.  Acute on chronic blood loss anemia: Patient received multiple blood transfusions.  L45 and folic acid are low and will need to be started p.o. when able. Epogen during dialysis Hemoglobin 7.5 this morning.    3.  End-stage  renal disease with severe hyperkalemia: CRRT discontinued as per nephrology  Dialysis as per nephrology   4.  Hyperkalemia: This is normalized  5.  Acute hypoxic respiratory failure with inability to protect airway: Patient intubated on admission and now extubated.   6.  EtOH abuse:  Patient needs speech consultation.  Patient is currently n.p.o.   Patient will need outpatient palliative care  services upon discharge  Management plans discussed with nursing  CODE STATUS: full  TOTAL TIME TAKING CARE OF THIS PATIENT: 24 minutes.     POSSIBLE D/C ??, DEPENDING ON CLINICAL CONDITION.   [name] M.D on 05/07/2018 at 10:32 AM  Between 7am to 6pm - Pager - [phone] After 6pm go to [email or web address] - password [email or web address] [facility]  Office  [phone]  CC: Primary care physician; Donnie Coffin, MD  Note: This dictation was prepared with Dragon dictation along with smaller phrase technology. Any transcriptional errors that result from this process are unintentional.

## 2018-05-07 NOTE — Progress Notes (Signed)
[facility], [place] 05/07/18  Subjective:   Remains lethargic this morning ICU nursing staff states that he had some conversation earlier today Nursing staff reports low blood sugars.  Started on D10 continuous infusion  Objective:  Vital signs in last 24 hours:  Temp:  [97.3 F (36.3 C)-98 F (36.7 C)] 97.7 F (36.5 C) (03/15 1052) Pulse Rate:  [67-80] 67 (03/15 1052) Resp:  [16-40] 18 (03/15 1052) BP: (83-108)/(43-75) 90/61 (03/15 1052) SpO2:  [89 %-100 %] 97 % (03/15 1052) Weight:  [76.5 kg] 76.5 kg (03/15 0500)  Weight change:  Filed Weights   05/03/18 2100 05/05/18 0030 05/07/18 0500  Weight: 71.4 kg 77.7 kg 76.5 kg    Intake/Output:    Intake/Output Summary (Last 24 hours) at 05/07/2018 1358 Last data filed at 05/07/2018 0600 Gross per 24 hour  Intake 828.55 ml  Output -  Net 828.55 ml     Physical Exam: General:  Critically ill-appearing  HEENT  moist oral mucous membranes  Neck  supple, no masses  Pulm/lungs  oxygen supplementation by nasal cannula  CVS/Heart  tachycardic, irregular  Abdomen:   Soft, nontender  Extremities:  No edema  Neurologic:  Sedated   Skin:  no acute rashes  Access:  Left upper extremity AV fistula, right femoral temporary catheter       Basic Metabolic Panel:  Recent Labs  Lab 05/05/18 1611 05/05/18 2043 05/06/18 0048 05/06/18 0522 05/07/18 0458  NA 135 135 135 135 136  K 3.9 3.7 3.7 3.7 3.5  CL 102 102 102 103 104  CO2 23 24 25 24 26   GLUCOSE 108* 153* 124* 107* 95  BUN 30* 26* 24* 21 28*  CREATININE 2.49* 2.16* 2.15* 1.92* 3.32*  CALCIUM 7.8* 7.7* 7.8* 7.9* 7.4*  MG 1.8 1.9 2.0 1.9 2.0  PHOS 4.9* 4.2 4.2 3.7 4.5     CBC: Recent Labs  Lab 05/03/18 1747  05/04/18 0345  05/04/18 1646 05/05/18 0103 05/05/18 0508 05/06/18 0522 05/07/18 0458  WBC 8.8  --  16.3*  --   --   --  17.8* 11.5* 8.8  NEUTROABS 6.1  --   --   --   --   --   --   --   --   HGB 5.5*   < > 8.0*   < > 8.7*  8.6* 8.6* 8.2* 7.5*  HCT 18.5*   < > 24.4*   < > 26.3* 26.0* 26.1* 25.5* 23.9*  MCV 100.0  --  91.7  --   --   --  90.9 92.7 94.8  PLT 183  --  169  --   --   --  108* 107* 104*   < > = values in this interval not displayed.      Lab Results  Component Value Date   HEPBSAG Negative 04/18/2018   HEPBSAB Reactive 02/15/2017      Microbiology:  Recent Results (from the past 240 hour(s))  MRSA PCR Screening     Status: None   Collection Time: 05/03/18  8:59 PM  Result Value Ref Range Status   MRSA by PCR NEGATIVE NEGATIVE Final    Comment:        The GeneXpert MRSA Assay (FDA approved for NASAL specimens only), is one component of a comprehensive MRSA colonization surveillance program. It is not intended to diagnose MRSA infection nor to guide or monitor treatment for MRSA infections. Performed at [facility], [address] [place],  [place] [address]     Coagulation Studies: No results for input(s): LABPROT, INR in the last 72 hours.  Urinalysis: No results for input(s): COLORURINE, LABSPEC, PHURINE, GLUCOSEU, HGBUR, BILIRUBINUR, KETONESUR, PROTEINUR, UROBILINOGEN, NITRITE, LEUKOCYTESUR in the last 72 hours.  Invalid input(s): APPERANCEUR    Imaging: Dg Chest Port 1 View  Result Date: 05/06/2018 CLINICAL DATA:  Respiratory failure EXAM: PORTABLE CHEST 1 VIEW COMPARISON:  05/05/2018 FINDINGS: Cardiomegaly with suspected mild interstitial edema, increased. Layering small bilateral pleural effusions, increased. No pneumothorax. Left vascular stent. IMPRESSION: Cardiomegaly with suspected mild interstitial edema and small bilateral pleural effusions, increased. Electronically Signed   By: Julian Hy M.D.   On: 05/06/2018 05:16     Medications:   . dextrose 125 mL/hr at 05/07/18 1141   . chlorhexidine  15 mL Mouth Rinse BID  . [START ON 05/08/2018] epoetin (EPOGEN/PROCRIT) injection  4,000 Units Intravenous Q M,W,F-HD  . mouth rinse  15 mL Mouth  Rinse q12n4p  . pantoprazole (PROTONIX) IV  40 mg Intravenous Q12H   acetaminophen **OR** acetaminophen, haloperidol lactate, ipratropium-albuterol, LORazepam, [DISCONTINUED] ondansetron **OR** ondansetron (ZOFRAN) IV, polyethylene glycol  Assessment/ Plan:  63 y.o. male withend stage renal disease on hemodialysis, hypertension, hepatitis C, anemia, seizure disorder, COPD/tobacco abuse,substance abuse and alcohol abuse,secondary hyperparathyroidism, chronic systolic CHF  [facility] [address]Left arm AVG. 195 minutes/73.5 kg  1.  End-stage renal disease with severe hyperkalemia Initially required CRRT for hypotension.  It was discontinued on Saturday morning.  Plan to continue intermittent hemodialysis MWF schedule. - leaving the femoral temp cath in for safety/back up. Maybe removed once mental status is better  2.  Severe anemia of chronic kidney disease and GI bleed Recent upper endoscopy showed 1 cm ulcer in the duodenal bulb and H. pylori antigen positive.  Patient presented again with hematemesis and hematochezia, hypotension and altered mental status.  S/p 3 multiple units of blood transfusion. EGD showed that the esophagus was normal, 1 nonbleeding duodenal ulcer with visible vessel was found.  Coagulation for hemostasis was done and a hemostatic clip was placed by endoscopy. -Hemoglobin 7.5 -Epogen therapy to be scheduled with hemodialysis  3.  Cardiac July 2019 echo moderate to severe mitral regurgitation, calcified mitral valve, 35 to 40%, pulmonary hypertension -pressure greater than 65 mmHg  4.  Acute respiratory failure Extubated March 13. -Currently on nasal cannula oxygen  5.  Agitation ?  Alcohol withdrawal -On alcohol withdrawal precautions.  Managed with Ativan          LOS: [place] 3/15/20201:58 PM  [place], [place]  Note: This note was prepared with Dragon dictation. Any transcription  errors are unintentional

## 2018-05-07 NOTE — Progress Notes (Signed)
PT Cancellation Note  Patient Details Name: [name] MRN: [id number] DOB: 04/08/1955   Cancelled Treatment:    Reason Eval/Treat Not Completed: Fatigue/lethargy limiting ability to participate.  Pt is unable to work with PT and will try again at another time, lethargic and fatigued.   Ramond Dial 05/07/2018, 10:40 AM  Mee Hives, PT MS Acute Rehab Dept. Number: [place] and [place]

## 2018-05-08 DIAGNOSIS — R9431 Abnormal electrocardiogram [ECG] [EKG]: Secondary | ICD-10-CM

## 2018-05-08 LAB — RENAL FUNCTION PANEL
Albumin: 2.3 g/dL — ABNORMAL LOW (ref 3.5–5.0)
Anion gap: 10 (ref 5–15)
BUN: 37 mg/dL — AB (ref 8–23)
CO2: 21 mmol/L — ABNORMAL LOW (ref 22–32)
Calcium: 7.1 mg/dL — ABNORMAL LOW (ref 8.9–10.3)
Chloride: 97 mmol/L — ABNORMAL LOW (ref 98–111)
Creatinine, Ser: 4.92 mg/dL — ABNORMAL HIGH (ref 0.61–1.24)
GFR calc Af Amer: 13 mL/min — ABNORMAL LOW (ref >60)
GFR calc non Af Amer: 12 mL/min — ABNORMAL LOW (ref >60)
GLUCOSE: 117 mg/dL — AB (ref 70–99)
PHOSPHORUS: 5.7 mg/dL — AB (ref 2.5–4.6)
Potassium: 3.5 mmol/L (ref 3.5–5.1)
Sodium: 128 mmol/L — ABNORMAL LOW (ref 135–145)

## 2018-05-08 LAB — GLUCOSE, CAPILLARY
GLUCOSE-CAPILLARY: 88 mg/dL (ref 70–99)
Glucose-Capillary: 107 mg/dL — ABNORMAL HIGH (ref 70–99)
Glucose-Capillary: 108 mg/dL — ABNORMAL HIGH (ref 70–99)
Glucose-Capillary: 66 mg/dL — ABNORMAL LOW (ref 70–99)
Glucose-Capillary: 86 mg/dL (ref 70–99)
Glucose-Capillary: 87 mg/dL (ref 70–99)
Glucose-Capillary: 89 mg/dL (ref 70–99)
Glucose-Capillary: 94 mg/dL (ref 70–99)

## 2018-05-08 LAB — BASIC METABOLIC PANEL
Anion gap: 9 (ref 5–15)
BUN: 37 mg/dL — ABNORMAL HIGH (ref 8–23)
CO2: 23 mmol/L (ref 22–32)
Calcium: 7.2 mg/dL — ABNORMAL LOW (ref 8.9–10.3)
Chloride: 99 mmol/L (ref 98–111)
Creatinine, Ser: 4.8 mg/dL — ABNORMAL HIGH (ref 0.61–1.24)
GFR calc non Af Amer: 12 mL/min — ABNORMAL LOW (ref >60)
GFR, EST AFRICAN AMERICAN: 14 mL/min — AB (ref >60)
Glucose, Bld: 92 mg/dL (ref 70–99)
Potassium: 3.7 mmol/L (ref 3.5–5.1)
Sodium: 131 mmol/L — ABNORMAL LOW (ref 135–145)

## 2018-05-08 LAB — PREPARE RBC (CROSSMATCH)

## 2018-05-08 LAB — CBC
HCT: 22.2 % — ABNORMAL LOW (ref 39.0–52.0)
Hemoglobin: 7.2 g/dL — ABNORMAL LOW (ref 13.0–17.0)
MCH: 30.5 pg (ref 26.0–34.0)
MCHC: 32.4 g/dL (ref 30.0–36.0)
MCV: 94.1 fL (ref 80.0–100.0)
Platelets: 118 10*3/uL — ABNORMAL LOW (ref 150–400)
RBC: 2.36 MIL/uL — ABNORMAL LOW (ref 4.22–5.81)
RDW: 18 % — ABNORMAL HIGH (ref 11.5–15.5)
WBC: 6.4 10*3/uL (ref 4.0–10.5)
nRBC: 0.3 % — ABNORMAL HIGH (ref 0.0–0.2)

## 2018-05-08 MED ORDER — NEPRO/CARBSTEADY PO LIQD
237.0000 mL | Freq: Two times a day (BID) | ORAL | Status: DC
  Administered 2018-05-08 – 2018-05-09 (×2): 237 mL via ORAL

## 2018-05-08 MED ORDER — CYANOCOBALAMIN 1000 MCG/ML IJ SOLN
1000.0000 ug | Freq: Once | INTRAMUSCULAR | Status: AC
  Administered 2018-05-08: 1000 ug via INTRAMUSCULAR
  Filled 2018-05-08: qty 1

## 2018-05-08 MED ORDER — BISMUTH SUBSALICYLATE 262 MG PO CHEW: 262.0000 mg | CHEWABLE_TABLET | Freq: Four times a day (QID) | ORAL | Status: DC

## 2018-05-08 MED ORDER — RENA-VITE PO TABS
1.0000 | ORAL_TABLET | Freq: Every day | ORAL | Status: DC
  Administered 2018-05-08 – 2018-05-09 (×2): 1 via ORAL
  Filled 2018-05-08 (×2): qty 1

## 2018-05-08 MED ORDER — BISMUTH SUBSALICYLATE 262 MG/15ML PO SUSP
15.0000 mL | Freq: Four times a day (QID) | ORAL | Status: DC
  Administered 2018-05-08 – 2018-05-09 (×4): 15 mL via ORAL
  Filled 2018-05-08: qty 118

## 2018-05-08 MED ORDER — VITAMIN B-12 1000 MCG PO TABS
1000.0000 ug | ORAL_TABLET | Freq: Every day | ORAL | Status: DC
  Administered 2018-05-09: 1000 ug via ORAL
  Filled 2018-05-08 (×2): qty 1

## 2018-05-08 MED ORDER — FOLIC ACID 1 MG PO TABS
1.0000 mg | ORAL_TABLET | Freq: Every day | ORAL | Status: DC
  Administered 2018-05-08 – 2018-05-09 (×2): 1 mg via ORAL
  Filled 2018-05-08 (×3): qty 1

## 2018-05-08 MED ORDER — VITAMIN C 500 MG PO TABS
500.0000 mg | ORAL_TABLET | Freq: Two times a day (BID) | ORAL | Status: DC
  Administered 2018-05-08 – 2018-05-09 (×3): 500 mg via ORAL
  Filled 2018-05-08 (×4): qty 1

## 2018-05-08 MED ORDER — SODIUM CHLORIDE 0.9% IV SOLUTION
Freq: Once | INTRAVENOUS | Status: AC
  Administered 2018-05-09: 12:00:00 via INTRAVENOUS

## 2018-05-08 MED ORDER — PANTOPRAZOLE SODIUM 40 MG PO TBEC
40.0000 mg | DELAYED_RELEASE_TABLET | Freq: Two times a day (BID) | ORAL | Status: DC
  Administered 2018-05-08 – 2018-05-09 (×2): 40 mg via ORAL
  Filled 2018-05-08 (×3): qty 1

## 2018-05-08 MED ORDER — TETRACYCLINE HCL 250 MG PO CAPS
500.0000 mg | ORAL_CAPSULE | Freq: Every day | ORAL | Status: DC
  Administered 2018-05-08: 500 mg via ORAL
  Filled 2018-05-08 (×2): qty 2

## 2018-05-08 MED ORDER — METRONIDAZOLE 250 MG PO TABS
250.0000 mg | ORAL_TABLET | Freq: Four times a day (QID) | ORAL | Status: DC
  Administered 2018-05-08 – 2018-05-09 (×4): 250 mg via ORAL
  Filled 2018-05-08 (×9): qty 1

## 2018-05-08 MED ORDER — BISMUTH SUBSALICYLATE 262 MG PO CHEW
262.0000 mg | CHEWABLE_TABLET | Freq: Four times a day (QID) | ORAL | Status: DC
  Filled 2018-05-08 (×3): qty 1

## 2018-05-08 NOTE — Consult Note (Addendum)
Pharmacy Antibiotic Note  [name] is a 63 y.o. male admitted on 05/03/2018 with H Pylori Infection.  Pharmacy has been consulted for H Pylori treatment dosing dosing.  Pt with qtc prolongation (qtc 3/16 531) - will proceed with non-clarythromycin based 4 - drug therapy at this time, renally adjusted for HD patient.  Plan: Bismuth Subsalicylate 262mg  qid x 14 days Metronidazole 250mg  qid x 14 days Tetracycline 500mg  qd x 14 days (after HD on HD days) Pantoprazole 40mg  bid x 14 days  Height: 6' (182.9 cm) Weight: 172 lb 9.9 oz (78.3 kg) IBW/kg (Calculated) : 77.6  Temp (24hrs), Avg:98 F (36.7 C), Min:97.7 F (36.5 C), Max:98.2 F (36.8 C)  Recent Labs  Lab 05/04/18 0345  05/05/18 0508  05/06/18 0048 05/06/18 0522 05/07/18 0458 05/08/18 0523 05/08/18 1103  WBC 16.3*  --  17.8*  --   --  11.5* 8.8 6.4  --   CREATININE 4.29*   < >  --    < > 2.15* 1.92* 3.32* 4.80* 4.92*   < > = values in this interval not displayed.    Estimated Creatinine Clearance: 16.6 mL/min (A) (by C-G formula based on SCr of 4.92 mg/dL (H)).    Allergies  Allergen Reactions  . Sulfa Antibiotics Rash and Shortness Of Breath    Antimicrobials this admission: 3/16 Tetracycline >> 3/16 Metronidazole >>  Dose adjustments this admission: None  Microbiology results: H Pylori Stool Antigen positive  3/11 MRSA PCR: Neg  Thank you for allowing pharmacy to be a part of this patient's care.  Lu Duffel, PharmD, BCPS Clinical Pharmacist 05/08/2018 3:27 PM

## 2018-05-08 NOTE — Progress Notes (Signed)
HD tx started w/o complication Pt calm cooperative and stable. B/P is soft for UF goal will notify MD for adjustment.    05/08/18 1045  Vital Signs  Pulse Rate 79  Pulse Rate Source Monitor  Resp (!) 27  BP (!) 97/59  BP Location Right Arm  BP Method Automatic  Patient Position (if appropriate) Lying  Oxygen Therapy  SpO2 100 %  O2 Device Nasal Cannula  O2 Flow Rate (L/min) 2 L/min  Pulse Oximetry Type Continuous  During Hemodialysis Assessment  Blood Flow Rate (mL/min) 400 mL/min  Arterial Pressure (mmHg) -170 mmHg  Venous Pressure (mmHg) 190 mmHg  Transmembrane Pressure (mmHg) 60 mmHg  Ultrafiltration Rate (mL/min) 740 mL/min  Dialysate Flow Rate (mL/min) 800 ml/min  Conductivity: Machine  13.8  HD Safety Checks Performed Yes  Dialysis Fluid Bolus Normal Saline  Bolus Amount (mL) 250 mL  Intra-Hemodialysis Comments Tx initiated  Fistula / Graft Left Forearm Arteriovenous fistula  Placement Date: 09/20/16   Placed prior to admission: Yes  Orientation: Left  Access Location: Forearm  Access Type: Arteriovenous fistula  Status Accessed  Needle Size 15 g  Drainage Description None

## 2018-05-08 NOTE — Progress Notes (Signed)
PT Cancellation Note  Patient Details Name: [name] MRN: [id number] DOB: 08/16/1955   Cancelled Treatment:    Reason Eval/Treat Not Completed: Medical issues which prohibited therapy.  Pt had very low BP and 7.2 hgb, will try again at another time.   Ramond Dial 05/08/2018, 9:25 AM  Mee Hives, PT MS Acute Rehab Dept. Number: [place] and [place]

## 2018-05-08 NOTE — Progress Notes (Signed)
Patients right femoral access site where the catheter was removed started bleeding again.  Pressure was reapplied and held for a total of 20 more minutes.  Patient was instructed to stay flat for 30-45 minutes.  Last check patients dressing was dry and intact.

## 2018-05-08 NOTE — Progress Notes (Signed)
Nutrition Follow Up Note   DOCUMENTATION CODES:   Not applicable  INTERVENTION:   Nepro Shake po BID, each supplement provides 425 kcal and 19 grams protein  Rena-vite daily   2 gram sodium diet   Vitamin C 548m po BID  NUTRITION DIAGNOSIS:   Inadequate oral intake related to inability to eat as evidenced by NPO status. -pt initiated on diet today   GOAL:   Patient will meet greater than or equal to 90% of their needs  -not met   MONITOR:   PO intake, Supplement acceptance, Labs, Weight trends, Skin, I & O's  ASSESSMENT:   63year old male with PMHx of PVD, ESRD on HD MWF, pulmonary hypertension, chronic combined systolic and diastolic CHF, arthritis, DM, CAD, HTN, hx MI, hepatitis, EtOH abuse admitted with hematemesis and hematochezia, AMS requiring intubation on 3/11 for airway protection, hypotension.   Pt s/p EGD 3/12; pt found to have a non-bleeding duodenal ulcer   Pt extubated 3/13; pt remains lethargic. Pt placed on dextrose infusion secondary to hypoglycemia 3/13. Refeed labs stable. Pt initiated on a diet today. Pt continues on HD. RD will add supplements and vitamins to help pt meet his estimated needs. Will initiate pt on vitamin C as pt is at increased risk for deficiency secondary to chronic HD and and deficiency can worsen GI bleeding. Recommend continue this for 30 days after discharge. Per chart, pt is weight stable since admit.   Medications reviewed and include: epoetin, protonix, protonix, 10% dextrose _0 /hr  Labs reviewed: Na 131(L), K 3.7 wnl P 4.5 wnl, Mg 2.0 wnl- 3/15 Hgb 7.2(L), Hct 22.2(L) B12- 240- 3/12  Diet Order:   Diet Order            Diet 2 gram sodium Room service appropriate? Yes; Fluid consistency: Thin; Fluid restriction: 1200 mL Fluid  Diet effective now             EDUCATION NEEDS:   No education needs have been identified at this time  Skin:  Skin Assessment: Reviewed RN Assessment  Last BM:  3/16- today    Height:   Ht Readings from Last 1 Encounters:  05/03/18 6' (1.829 m)   Weight:   Wt Readings from Last 1 Encounters:  05/07/18 76.5 kg   Ideal Body Weight:  80.9 kg  BMI:  Body mass index is 22.87 kg/m.  Estimated Nutritional Needs:   Kcal:  2000-2300kcal/day   Protein:  91-105g/day   Fluid:  UOP + 1 L  CKoleen DistanceMS, RD, LDN Pager #- [phone]#- [phone] Hours Pager: [phone]

## 2018-05-08 NOTE — Progress Notes (Signed)
Trialysis catheter was removed from right femoral vein.  Purple tip was intact.  Pressure was held for 25 minutes

## 2018-05-08 NOTE — Progress Notes (Signed)
Pre HD Tx   05/08/18 1042  Vital Signs  Temp 98.2 F (36.8 C)  Temp Source Oral  Pulse Rate 79  Pulse Rate Source Monitor  Resp (!) 22  BP 101/60  BP Location Right Arm  BP Method Automatic  Patient Position (if appropriate) Lying  Oxygen Therapy  SpO2 100 %  O2 Device Nasal Cannula  O2 Flow Rate (L/min) 2 L/min  Pulse Oximetry Type Continuous  Pain Assessment  Pain Scale 0-10  Pain Score 0  Dialysis Weight  Weight 80 kg  Type of Weight Pre-Dialysis  Time-Out for Hemodialysis  What Procedure? HD  Pt Identifiers(min of two) First/Last Name;MRN/Account#  Correct Site? Yes  Correct Side? Yes  Correct Procedure? Yes  Consents Verified? Yes  Rad Studies Available? N/A  Safety Precautions Reviewed? Yes  Engineer, civil (consulting) Number 5  Station Number 3  UF/Alarm Test Passed  Conductivity: Meter 13.8  Conductivity: Machine  13.8  pH 7.4  Reverse Osmosis Main  Normal Saline Lot Number [id number]  Dialyzer Lot Number [id number]  Disposable Set Lot Number [id number]  Machine Temperature 98.6 F (37 C)  Musician and Audible Yes  Blood Lines Intact and Secured Yes  Pre Treatment Patient Checks  Vascular access used during treatment Fistula  Hepatitis B Surface Antigen Results Negative  Date Hepatitis B Surface Antigen Drawn 04/18/18  Hepatitis B Surface Antibody  (>10)  Date Hepatitis B Surface Antibody Drawn 04/18/18  Hemodialysis Consent Verified Yes  Hemodialysis Standing Orders Initiated Yes  ECG (Telemetry) Monitor On Yes  Prime Ordered Normal Saline  Length of  DialysisTreatment -hour(s) 3.5 Hour(s)  Dialysis Treatment Comments Na 140  Dialyzer Elisio 17H NR  Dialysate 2K, 2.5 Ca  Dialysis Anticoagulant None  Dialysate Flow Ordered 800  Blood Flow Rate Ordered 400 mL/min  Ultrafiltration Goal 2 Liters  Dialysis Blood Pressure Support Ordered Normal Saline  Education / Care Plan  Dialysis Education Provided Yes  Documented Education in Care Plan Yes   Fistula / Graft Left Forearm Arteriovenous fistula  Placement Date: 09/20/16   Placed prior to admission: Yes  Orientation: Left  Access Location: Forearm  Access Type: Arteriovenous fistula  Site Condition No complications  Fistula / Graft Assessment Present;Thrill;Bruit  Status Patent  Drainage Description None  Hemodialysis Catheter Right Femoral vein  Placement Date/Time: 05/04/18 0600   Person Inserting Catheter: Orson Eva  Orientation: Right  Access Location: Femoral vein  Site Condition No complications

## 2018-05-08 NOTE — Progress Notes (Signed)
[facility] [place] NAME: [name]    MR#:  [id number]  DATE OF BIRTH:  [birth year]  SUBJECTIVE:  Patient eating this am alert and looks better this am  REVIEW OF SYSTEMS:    Review of Systems  Constitutional: Negative for fever, chills weight loss ++weakness HENT: Negative for ear pain, nosebleeds, congestion, facial swelling, rhinorrhea, neck pain, neck stiffness and ear discharge.   Respiratory: Negative for cough, shortness of breath, wheezing  Cardiovascular: Negative for chest pain, palpitations and leg swelling.  Gastrointestinal: Negative for heartburn, abdominal pain, vomiting, diarrhea or consitpation Genitourinary: Negative for dysuria, urgency, frequency, hematuria Musculoskeletal: Negative for back pain or joint pain Neurological: Negative for dizziness, seizures, syncope, focal weakness,  numbness and headaches.  Hematological: Does not bruise/bleed easily.  Psychiatric/Behavioral: Negative for hallucinations, confusion, dysphoric mood    Tolerating Diet: yes      DRUG ALLERGIES:   Allergies  Allergen Reactions  . Sulfa Antibiotics Rash and Shortness Of Breath    VITALS:  Blood pressure (!) 92/55, pulse 75, temperature 98.2 F (36.8 C), temperature source Oral, resp. rate 18, height 6' (1.829 m), weight 80 kg, SpO2 100 %.  PHYSICAL EXAMINATION:  Constitutional: Appears well-developed and well-nourished. No distress. HENT: Normocephalic. [name] Oropharynx is clear and moist.  Eyes: Conjunctivae and EOM are normal. PERRLA, no scleral icterus.  Neck: Normal ROM. Neck supple. No JVD. No tracheal deviation. CVS: RRR, S1/S2 +, no murmurs, no gallops, no carotid bruit.  Pulmonary: Effort and breath sounds normal, no stridor, rhonchi, wheezes, rales.  Abdominal: Soft. BS +,  no distension, tenderness, rebound or guarding.  Musculoskeletal: Normal range of motion. No edema and no tenderness.  Neuro: Alert. CN 2-12  grossly intact. No focal deficits. Skin: Skin is warm and dry. No rash noted. Psychiatric: Normal mood and affect.      LABORATORY PANEL:   CBC Recent Labs  Lab 05/08/18 0523  WBC 6.4  HGB 7.2*  HCT 22.2*  PLT 118*   ------------------------------------------------------------------------------------------------------------------  Chemistries  Recent Labs  Lab 05/03/18 1747  05/07/18 0458  05/08/18 1103  NA 142   < > 136   < > 128*  K 4.7   < > 3.5   < > 3.5  CL 113*   < > 104   < > 97*  CO2 23   < > 26   < > 21*  GLUCOSE 98   < > 95   < > 117*  BUN 38*   < > 28*   < > 37*  CREATININE 3.49*   < > 3.32*   < > 4.92*  CALCIUM 6.8*   < > 7.4*   < > 7.1*  MG  --    < > 2.0  --   --   AST 20  --   --   --   --   ALT 14  --   --   --   --   ALKPHOS 61  --   --   --   --   BILITOT 0.6  --   --   --   --    < > = values in this interval not displayed.   ------------------------------------------------------------------------------------------------------------------  Cardiac Enzymes No results for input(s): TROPONINI in the last 168 hours. ------------------------------------------------------------------------------------------------------------------  RADIOLOGY:  No results found.   ASSESSMENT AND PLAN:   63 year old male with end-stage renal disease on hemodialysis, COPD, EtOH abuse and recent discharge  from the hospital with GI bleed who presented with massive GI bleed.  1.  Acute upper GI bleed: Patient underwent EGD EGD on 05/04/2018, duodenal ulcer with visible vessel in the bulb status post cautery and clip placed adjacent to the ulcer Continue Protonix twice daily  He has H. pylori stool antigen positive which will need to be treated on triple therapy    2.  Acute on chronic blood loss anemia: Patient received multiple blood transfusions.  I29 and folic acid are low Start supplementation for N98 and folic acid. Hemoglobin 7.2 this morning.  Blood  pressure low/normal and therefore will receive 1 unit PRBC. CBC for a.m. 3.  End-stage renal disease with severe hyperkalemia: CRRT discontinued as per nephrology  Continue hemodialysis Monday, Wednesday and Friday.  4.  Hyperkalemia: This has normalized  5.  Acute hypoxic respiratory failure with inability to protect airway: Patient intubated on admission and now extubated. Patient doing well from a respiratory standpoint  6.  EtOH abuse: Patient counseled against drinking EtOH.  PT consult for discharge planning  Outpatient palliative care service may be of benefit for this patient.    Management plans discussed with the patient and he is in agreement.  CODE STATUS: full  TOTAL TIME TAKING CARE OF THIS PATIENT: 26 minutes.     POSSIBLE D/C tomorrow, DEPENDING ON CLINICAL CONDITION.   [name] M.D on 05/08/2018 at 12:51 PM  Between 7am to 6pm - Pager - [phone] After 6pm go to [email or web address] - password [email or web address] [facility]  Office  [phone]  CC: Primary care physician; Thomas Coffin, MD  Note: This dictation was prepared with Dragon dictation along with smaller phrase technology. Any transcriptional errors that result from this process are unintentional.

## 2018-05-08 NOTE — Progress Notes (Signed)
Hypoglycemic Event  CBG: 66  Treatment: 4 oz Nepro Symptoms: asymptomatic  Follow-up CBG: Time: 1545 CBG Result:86  Possible Reasons for Event: missed lunch due to HD Comments/MD notified:    Zedekiah Hinderman,Erica W

## 2018-05-08 NOTE — Progress Notes (Signed)
Post Hd Tx    05/08/18 1418  Hand-Off documentation  Report given to (Full Name) Celso Amy, RN   Report received from (Full Name) Beatris Ship, RN   Vital Signs  Temp 98 F (36.7 C)  Temp Source Oral  Pulse Rate 77  Resp (!) 21  BP (!) 92/53  BP Location Right Arm  BP Method Automatic  Patient Position (if appropriate) Lying  Oxygen Therapy  SpO2 100 %  O2 Device Nasal Cannula  O2 Flow Rate (L/min) 2 L/min  Pulse Oximetry Type Continuous  Pain Assessment  Pain Scale 0-10  Pain Score 0  Dialysis Weight  Weight 78.3 kg  Type of Weight Post-Dialysis  Post-Hemodialysis Assessment  Rinseback Volume (mL) 250 mL  KECN 77.2 V  Dialyzer Clearance Lightly streaked  Duration of HD Treatment -hour(s) 3.5 hour(s)  Hemodialysis Intake (mL) 500 mL  UF Total -Machine (mL) 1000 mL (UF reduced for lower B/P, MD verbal Order. )  Net UF (mL) 500 mL  Tolerated HD Treatment Yes  AVG/AVF Arterial Site Held (minutes) 10 minutes  AVG/AVF Venous Site Held (minutes) 15 minutes  Fistula / Graft Left Forearm Arteriovenous fistula  Placement Date: 09/20/16   Placed prior to admission: Yes  Orientation: Left  Access Location: Forearm  Access Type: Arteriovenous fistula  Site Condition No complications  Fistula / Graft Assessment Present;Thrill;Bruit  Status Deaccessed  Drainage Description None

## 2018-05-08 NOTE — Progress Notes (Signed)
Post HD Assessment    05/08/18 1420  Neurological  Level of Consciousness Alert  Orientation Level Oriented X4  Respiratory  Respiratory Pattern Regular;Unlabored  Chest Assessment Chest expansion symmetrical  Bilateral Breath Sounds Diminished  Cough Non-productive  Cardiac  Pulse Regular  Heart Sounds S1, S2  ECG Monitor Yes  Cardiac Rhythm NSR  Vascular  R Radial Pulse +2  L Radial Pulse +2  Edema Generalized  Generalized Edema None  Psychosocial  Psychosocial (WDL) WDL

## 2018-05-08 NOTE — Progress Notes (Signed)
Chart reviewed. Pt in dialysis. Re attempt eval in am. Currently receiving a diet. Please hold PO's if any s/s of aspiration are noted.

## 2018-05-08 NOTE — Care Management Important Message (Signed)
Important Message  Patient Details  Name: [name] MRN: [id number] Date of Birth: [birth year]   Medicare Important Message Given:  Yes    Dannette Barbara 05/08/2018, 11:01 AM

## 2018-05-08 NOTE — Progress Notes (Signed)
[place], [place] 05/08/18  Subjective:  Patient seen during dialysis treatment. Tolerating well. UF off at the moment.  Hgb down to 7.2, transfusion being considered.   Objective:  Vital signs in last 24 hours:  Temp:  [98 F (36.7 C)-98.2 F (36.8 C)] 98.2 F (36.8 C) (03/16 1042) Pulse Rate:  [70-79] 73 (03/16 1200) Resp:  [16-27] 16 (03/16 1200) BP: (84-110)/(51-62) 84/51 (03/16 1200) SpO2:  [100 %] 100 % (03/16 1045) Weight:  [80 kg] 80 kg (03/16 1042)  Weight change:  Filed Weights   05/05/18 0030 05/07/18 0500 05/08/18 1042  Weight: 77.7 kg 76.5 kg 80 kg    Intake/Output:    Intake/Output Summary (Last 24 hours) at 05/08/2018 1212 Last data filed at 05/08/2018 0456 Gross per 24 hour  Intake -  Output 150 ml  Net -150 ml     Physical Exam: General:  Critically ill-appearing  HEENT  moist oral mucous membranes  Neck  supple, no masses  Pulm/lungs  CTAB normal effort  CVS/Heart  S1S2 no rubs  Abdomen:   Soft, nontender  Extremities:  No edema  Neurologic:  Sedated   Skin:  no acute rashes  Access:  Left upper extremity AV fistula, right femoral temporary catheter       Basic Metabolic Panel:  Recent Labs  Lab 05/05/18 1611 05/05/18 2043 05/06/18 0048 05/06/18 0522 05/07/18 0458 05/08/18 0523 05/08/18 1103  NA 135 135 135 135 136 131* 128*  K 3.9 3.7 3.7 3.7 3.5 3.7 3.5  CL 102 102 102 103 104 99 97*  CO2 23 24 25 24 26 23  21*  GLUCOSE 108* 153* 124* 107* 95 92 117*  BUN 30* 26* 24* 21 28* 37* 37*  CREATININE 2.49* 2.16* 2.15* 1.92* 3.32* 4.80* 4.92*  CALCIUM 7.8* 7.7* 7.8* 7.9* 7.4* 7.2* 7.1*  MG 1.8 1.9 2.0 1.9 2.0  --   --   PHOS 4.9* 4.2 4.2 3.7 4.5  --  5.7*     CBC: Recent Labs  Lab 05/03/18 1747  05/04/18 0345  05/05/18 0103 05/05/18 0508 05/06/18 0522 05/07/18 0458 05/08/18 0523  WBC 8.8  --  16.3*  --   --  17.8* 11.5* 8.8 6.4  NEUTROABS 6.1  --   --   --   --   --   --   --   --   HGB  5.5*   < > 8.0*   < > 8.6* 8.6* 8.2* 7.5* 7.2*  HCT 18.5*   < > 24.4*   < > 26.0* 26.1* 25.5* 23.9* 22.2*  MCV 100.0  --  91.7  --   --  90.9 92.7 94.8 94.1  PLT 183  --  169  --   --  108* 107* 104* 118*   < > = values in this interval not displayed.      Lab Results  Component Value Date   HEPBSAG Negative 04/18/2018   HEPBSAB Reactive 02/15/2017      Microbiology:  Recent Results (from the past 240 hour(s))  MRSA PCR Screening     Status: None   Collection Time: 05/03/18  8:59 PM  Result Value Ref Range Status   MRSA by PCR NEGATIVE NEGATIVE Final    Comment:        The GeneXpert MRSA Assay (FDA approved for NASAL specimens only), is one component of a comprehensive MRSA colonization surveillance program. It is not intended to diagnose MRSA infection nor to guide or  monitor treatment for MRSA infections. Performed at [facility], [address] [place], [place] [address]     Coagulation Studies: No results for input(s): LABPROT, INR in the last 72 hours.  Urinalysis: No results for input(s): COLORURINE, LABSPEC, PHURINE, GLUCOSEU, HGBUR, BILIRUBINUR, KETONESUR, PROTEINUR, UROBILINOGEN, NITRITE, LEUKOCYTESUR in the last 72 hours.  Invalid input(s): APPERANCEUR    Imaging: No results found.   Medications:   . dextrose 125 mL/hr at 05/08/18 0320   . chlorhexidine  15 mL Mouth Rinse BID  . Chlorhexidine Gluconate Cloth  6 each Topical Q0600  . epoetin (EPOGEN/PROCRIT) injection  4,000 Units Intravenous Q M,W,F-HD  . feeding supplement (NEPRO CARB STEADY)  237 mL Oral BID BM  . mouth rinse  15 mL Mouth Rinse q12n4p  . multivitamin  1 tablet Oral QHS  . pantoprazole  40 mg Oral BID AC  . vitamin C  500 mg Oral BID   acetaminophen **OR** acetaminophen, haloperidol lactate, ipratropium-albuterol, LORazepam, [DISCONTINUED] ondansetron **OR** ondansetron (ZOFRAN) IV, polyethylene glycol  Assessment/ Plan:  63 y.o. male withend stage renal  disease on hemodialysis, hypertension, hepatitis C, anemia, seizure disorder, COPD/tobacco abuse,substance abuse and alcohol abuse,secondary hyperparathyroidism, chronic systolic CHF  [facility] M[facility] [address]Left arm AVG. 195 minutes/73.5 kg  1.  End-stage renal disease with severe hyperkalemia Pt previously on CRRT, now transitioned to HD.  Seen during dialysis treatment today, tolerating well, UF off at the moment.   2.  Severe anemia of chronic kidney disease and GI bleed Recent upper endoscopy showed 1 cm ulcer in the duodenal bulb and H. pylori antigen positive.  Patient presented again with hematemesis and hematochezia, hypotension and altered mental status.  S/p 3 multiple units of blood transfusion. EGD showed that the esophagus was normal, 1 nonbleeding duodenal ulcer with visible vessel was found.  Coagulation for hemostasis was done and a hemostatic clip was placed by endoscopy. -Hemoglobin down to 7.2.  Hospitalist considering blood transfusion.  Continue Epogen with dialysis treatments.  3.  Cardiac July 2019 echo moderate to severe mitral regurgitation, calcified mitral valve, 35 to 40%, pulmonary hypertension -pressure greater than 65 mmHg Off pressors  4.  Acute respiratory failure Extubated March 13. -Patient doing well off of the ventilator.  Continue to monitor respiratory status.  5.  Agitation ?  Alcohol withdrawal          LOS: 5 Bradshaw Minihan 3/16/202012:12 PM  [place], [place]  Note: This note was prepared with Dragon dictation. Any transcription errors are unintentional

## 2018-05-08 NOTE — Progress Notes (Signed)
Spoke to Dr. Benjie Karvonen about decreasing rate if D10% since pt was eating.  She gave order to discontinue

## 2018-05-08 NOTE — Progress Notes (Addendum)
Pre HD Assessment    05/08/18 1040  Neurological  Level of Consciousness Alert  Orientation Level Oriented X4  Respiratory  Respiratory Pattern Regular;Unlabored  Chest Assessment Chest expansion symmetrical  Bilateral Breath Sounds Diminished  Cough Non-productive  Cardiac  Pulse Regular  Heart Sounds S1, S2  ECG Monitor Yes  Cardiac Rhythm NSR  Vascular  R Radial Pulse +2  L Radial Pulse +2  Edema Generalized  Generalized Edema None  Psychosocial  Psychosocial (WDL) WDL

## 2018-05-08 NOTE — Progress Notes (Signed)
HD Tx completed, tolerated well, UF goal adjusted, Met.    05/08/18 1415  Vital Signs  Pulse Rate 72  Resp 19  BP (!) 91/47  BP Location Right Arm  BP Method Automatic  Patient Position (if appropriate) Lying  Oxygen Therapy  SpO2 100 %  O2 Device Nasal Cannula  O2 Flow Rate (L/min) 2 L/min  Pulse Oximetry Type Continuous  During Hemodialysis Assessment  HD Safety Checks Performed Yes  KECN 77.2 KECN  Dialysis Fluid Bolus Normal Saline  Bolus Amount (mL) 250 mL  Intra-Hemodialysis Comments Progressing as prescribed

## 2018-05-09 ENCOUNTER — Other Ambulatory Visit (INDEPENDENT_AMBULATORY_CARE_PROVIDER_SITE_OTHER): Payer: Self-pay | Admitting: Vascular Surgery

## 2018-05-09 ENCOUNTER — Encounter: Payer: Self-pay | Admitting: Pulmonary Disease

## 2018-05-09 ENCOUNTER — Inpatient Hospital Stay: Payer: Medicare Other

## 2018-05-09 ENCOUNTER — Encounter
Admission: EM | Disposition: A | Discharge status: Other Acute Inpatient Hospital | Payer: Self-pay | Source: Home / Self Care | Attending: Internal Medicine

## 2018-05-09 DIAGNOSIS — I5022 Chronic systolic (congestive) heart failure: Secondary | ICD-10-CM

## 2018-05-09 DIAGNOSIS — R9431 Abnormal electrocardiogram [ECG] [EKG]: Secondary | ICD-10-CM

## 2018-05-09 DIAGNOSIS — E1122 Type 2 diabetes mellitus with diabetic chronic kidney disease: Secondary | ICD-10-CM

## 2018-05-09 DIAGNOSIS — Z992 Dependence on renal dialysis: Secondary | ICD-10-CM

## 2018-05-09 DIAGNOSIS — R578 Other shock: Secondary | ICD-10-CM

## 2018-05-09 DIAGNOSIS — I959 Hypotension, unspecified: Secondary | ICD-10-CM

## 2018-05-09 DIAGNOSIS — K264 Chronic or unspecified duodenal ulcer with hemorrhage: Secondary | ICD-10-CM

## 2018-05-09 DIAGNOSIS — I248 Other forms of acute ischemic heart disease: Secondary | ICD-10-CM

## 2018-05-09 DIAGNOSIS — Z720 Tobacco use: Secondary | ICD-10-CM

## 2018-05-09 DIAGNOSIS — D500 Iron deficiency anemia secondary to blood loss (chronic): Secondary | ICD-10-CM

## 2018-05-09 HISTORY — PX: EMBOLIZATION: CATH118239

## 2018-05-09 LAB — BASIC METABOLIC PANEL
Anion gap: 7 (ref 5–15)
BUN: 34 mg/dL — ABNORMAL HIGH (ref 8–23)
CO2: 25 mmol/L (ref 22–32)
Calcium: 7.1 mg/dL — ABNORMAL LOW (ref 8.9–10.3)
Chloride: 101 mmol/L (ref 98–111)
Creatinine, Ser: 4.21 mg/dL — ABNORMAL HIGH (ref 0.61–1.24)
GFR calc Af Amer: 16 mL/min — ABNORMAL LOW (ref >60)
GFR calc non Af Amer: 14 mL/min — ABNORMAL LOW (ref >60)
Glucose, Bld: 108 mg/dL — ABNORMAL HIGH (ref 70–99)
Potassium: 4 mmol/L (ref 3.5–5.1)
Sodium: 133 mmol/L — ABNORMAL LOW (ref 135–145)

## 2018-05-09 LAB — PREPARE RBC (CROSSMATCH)

## 2018-05-09 LAB — POTASSIUM (ARMC VASCULAR LAB ONLY): Potassium (ARMC vascular lab): 4.3 (ref 3.5–5.1)

## 2018-05-09 LAB — GLUCOSE, CAPILLARY
Glucose-Capillary: 116 mg/dL — ABNORMAL HIGH (ref 70–99)
Glucose-Capillary: 73 mg/dL (ref 70–99)
Glucose-Capillary: 87 mg/dL (ref 70–99)
Glucose-Capillary: 89 mg/dL (ref 70–99)
Glucose-Capillary: 89 mg/dL (ref 70–99)
Glucose-Capillary: 93 mg/dL (ref 70–99)
Glucose-Capillary: 94 mg/dL (ref 70–99)

## 2018-05-09 LAB — CBC
HCT: 24 % — ABNORMAL LOW (ref 39.0–52.0)
Hemoglobin: 7.8 g/dL — ABNORMAL LOW (ref 13.0–17.0)
MCH: 30 pg (ref 26.0–34.0)
MCHC: 32.5 g/dL (ref 30.0–36.0)
MCV: 92.3 fL (ref 80.0–100.0)
Platelets: 130 10*3/uL — ABNORMAL LOW (ref 150–400)
RBC: 2.6 MIL/uL — ABNORMAL LOW (ref 4.22–5.81)
RDW: 17.9 % — ABNORMAL HIGH (ref 11.5–15.5)
WBC: 7.8 10*3/uL (ref 4.0–10.5)
nRBC: 0 % (ref 0.0–0.2)

## 2018-05-09 LAB — HEMOGLOBIN AND HEMATOCRIT, BLOOD
HCT: 21.3 % — ABNORMAL LOW (ref 39.0–52.0)
HEMOGLOBIN: 6.9 g/dL — AB (ref 13.0–17.0)

## 2018-05-09 LAB — TROPONIN I
Troponin I: 0.94 ng/mL (ref <0.03)
Troponin I: 0.75 ng/mL (ref <0.03)
Troponin I: 0.71 ng/mL (ref <0.03)

## 2018-05-09 LAB — HEMOGLOBIN: Hemoglobin: 6.1 g/dL — ABNORMAL LOW (ref 13.0–17.0)

## 2018-05-09 SURGERY — EMBOLIZATION
Anesthesia: Moderate Sedation

## 2018-05-09 MED ORDER — FENTANYL CITRATE (PF) 100 MCG/2ML IJ SOLN
INTRAMUSCULAR | Status: DC | PRN
  Administered 2018-05-09 (×3): 25 ug via INTRAVENOUS

## 2018-05-09 MED ORDER — LIDOCAINE HCL (PF) 1 % IJ SOLN
INTRAMUSCULAR | Status: AC
  Filled 2018-05-09: qty 30

## 2018-05-09 MED ORDER — ATORVASTATIN CALCIUM 20 MG PO TABS: 40.0000 mg | ORAL_TABLET | Freq: Every day | ORAL | Status: DC

## 2018-05-09 MED ORDER — SODIUM CHLORIDE 0.9% IV SOLUTION
Freq: Once | INTRAVENOUS | Status: AC
  Administered 2018-05-09: 17:00:00 via INTRAVENOUS

## 2018-05-09 MED ORDER — DIPHENHYDRAMINE HCL 50 MG/ML IJ SOLN
12.5000 mg | Freq: Once | INTRAMUSCULAR | Status: AC
  Administered 2018-05-09: 12.5 mg via INTRAVENOUS

## 2018-05-09 MED ORDER — SODIUM CHLORIDE 0.9 % IV BOLUS
250.0000 mL | Freq: Once | INTRAVENOUS | Status: AC
  Administered 2018-05-09: 250 mL via INTRAVENOUS

## 2018-05-09 MED ORDER — ONDANSETRON HCL 4 MG/2ML IJ SOLN: 4.0000 mg | Freq: Once | INTRAMUSCULAR | Status: DC | PRN

## 2018-05-09 MED ORDER — HEPARIN (PORCINE) IN NACL 1000-0.9 UT/500ML-% IV SOLN
INTRAVENOUS | Status: AC
  Filled 2018-05-09: qty 1000

## 2018-05-09 MED ORDER — MORPHINE SULFATE (PF) 2 MG/ML IV SOLN
1.0000 mg | INTRAVENOUS | Status: DC | PRN
  Administered 2018-05-09 – 2018-05-10 (×3): 1 mg via INTRAVENOUS
  Filled 2018-05-09 (×2): qty 1

## 2018-05-09 MED ORDER — MIDAZOLAM HCL 5 MG/5ML IJ SOLN
INTRAMUSCULAR | Status: AC
  Filled 2018-05-09: qty 10

## 2018-05-09 MED ORDER — ONDANSETRON HCL 4 MG/2ML IJ SOLN
4.0000 mg | Freq: Four times a day (QID) | INTRAMUSCULAR | Status: DC | PRN
  Administered 2018-05-09: 4 mg via INTRAVENOUS

## 2018-05-09 MED ORDER — AMIODARONE HCL 200 MG PO TABS
200.0000 mg | ORAL_TABLET | Freq: Every day | ORAL | Status: DC
  Filled 2018-05-09: qty 1

## 2018-05-09 MED ORDER — CHLORHEXIDINE GLUCONATE CLOTH 2 % EX PADS: 6.0000 | MEDICATED_PAD | Freq: Every day | CUTANEOUS | Status: DC

## 2018-05-09 MED ORDER — FAMOTIDINE 20 MG PO TABS: 40.0000 mg | ORAL_TABLET | Freq: Once | ORAL | Status: DC | PRN

## 2018-05-09 MED ORDER — BENZONATATE 100 MG PO CAPS
100.0000 mg | ORAL_CAPSULE | Freq: Three times a day (TID) | ORAL | Status: DC | PRN
  Administered 2018-05-09 (×2): 100 mg via ORAL
  Filled 2018-05-09 (×2): qty 1

## 2018-05-09 MED ORDER — MIDAZOLAM HCL 2 MG/2ML IJ SOLN
INTRAMUSCULAR | Status: DC | PRN
  Administered 2018-05-09 (×2): 0.5 mg via INTRAVENOUS

## 2018-05-09 MED ORDER — SODIUM CHLORIDE 0.9 % IV SOLN
INTRAVENOUS | Status: DC
  Administered 2018-05-09: 14:00:00 via INTRAVENOUS

## 2018-05-09 MED ORDER — IOHEXOL 300 MG/ML  SOLN
INTRAMUSCULAR | Status: DC | PRN
  Administered 2018-05-09: 95 mL via INTRAVENOUS

## 2018-05-09 MED ORDER — CEFAZOLIN SODIUM-DEXTROSE 1-4 GM/50ML-% IV SOLN: 1.0000 g | Freq: Once | INTRAVENOUS | Status: DC

## 2018-05-09 MED ORDER — NITROGLYCERIN 0.4 MG SL SUBL
SUBLINGUAL_TABLET | SUBLINGUAL | Status: AC
  Filled 2018-05-09: qty 1

## 2018-05-09 MED ORDER — DIPHENHYDRAMINE HCL 50 MG/ML IJ SOLN: 50.0000 mg | Freq: Once | INTRAMUSCULAR | Status: DC | PRN

## 2018-05-09 MED ORDER — FENTANYL CITRATE (PF) 100 MCG/2ML IJ SOLN
INTRAMUSCULAR | Status: AC
  Filled 2018-05-09: qty 4

## 2018-05-09 MED ORDER — DIPHENHYDRAMINE HCL 50 MG/ML IJ SOLN
12.5000 mg | Freq: Once | INTRAMUSCULAR | Status: AC
  Administered 2018-05-09: 12.5 mg via INTRAVENOUS
  Filled 2018-05-09: qty 1

## 2018-05-09 MED ORDER — HYDROMORPHONE HCL 1 MG/ML IJ SOLN
1.0000 mg | Freq: Once | INTRAMUSCULAR | Status: AC | PRN
  Administered 2018-05-09: 1 mg via INTRAVENOUS
  Filled 2018-05-09: qty 1

## 2018-05-09 MED ORDER — NITROGLYCERIN 0.4 MG SL SUBL
0.4000 mg | SUBLINGUAL_TABLET | SUBLINGUAL | Status: DC | PRN
  Administered 2018-05-09: 0.4 mg via SUBLINGUAL

## 2018-05-09 MED ORDER — MIDAZOLAM HCL 2 MG/ML PO SYRP: 8.0000 mg | ORAL_SOLUTION | Freq: Once | ORAL | Status: DC | PRN

## 2018-05-09 MED ORDER — METHYLPREDNISOLONE SODIUM SUCC 125 MG IJ SOLR: 125.0000 mg | Freq: Once | INTRAMUSCULAR | Status: DC | PRN

## 2018-05-09 MED ORDER — FENTANYL CITRATE (PF) 100 MCG/2ML IJ SOLN: 25.0000 ug | INTRAMUSCULAR | Status: DC | PRN

## 2018-05-09 MED ORDER — SODIUM CHLORIDE 0.9% IV SOLUTION: Freq: Once | INTRAVENOUS | Status: DC

## 2018-05-09 MED ORDER — CEFAZOLIN SODIUM-DEXTROSE 1-4 GM/50ML-% IV SOLN
1.0000 g | INTRAVENOUS | Status: AC
  Administered 2018-05-09: 1 g via INTRAVENOUS
  Filled 2018-05-09: qty 50

## 2018-05-09 SURGICAL SUPPLY — 20 items
CATH BEACON 5 .035 65 KMP TIP (CATHETERS) ×3 IMPLANT
CATH MICROCATH PRGRT 2.8F 110 (CATHETERS) ×1 IMPLANT
CATH PIG 70CM (CATHETERS) ×3 IMPLANT
CATH VS15FR (CATHETERS) ×3 IMPLANT
COIL 400 COMPLEX STD 5X12CM (Vascular Products) ×6 IMPLANT
DEVICE OCCLUSION POD5 (Vascular Products) ×1 IMPLANT
DEVICE OCCLUSION PODJ60 (Vascular Products) ×2 IMPLANT
DEVICE STARCLOSE SE CLOSURE (Vascular Products) ×3 IMPLANT
DEVICE TORQUE .025-.038 (MISCELLANEOUS) ×3 IMPLANT
GLIDEWIRE STIFF .35X180X3 HYDR (WIRE) ×3 IMPLANT
HANDLE DETACHMENT COIL (MISCELLANEOUS) ×3 IMPLANT
MICROCATH PROGREAT 2.8F 110 CM (CATHETERS) ×3
NEEDLE ENTRY 21GA 7CM ECHOTIP (NEEDLE) ×3 IMPLANT
OCCLUSION DEVICE POD5 (Vascular Products) ×3 IMPLANT
OCCLUSION DEVICE PODJ60 (Vascular Products) ×6 IMPLANT
SET INTRO CAPELLA COAXIAL (SET/KITS/TRAYS/PACK) ×3 IMPLANT
SHEATH ANL2 6FRX45 HC (SHEATH) ×3 IMPLANT
SHEATH BRITE TIP 5FRX11 (SHEATH) ×3 IMPLANT
TUBING CONTRAST HIGH PRESS 72 (TUBING) ×3 IMPLANT
WIRE J 3MM .035X145CM (WIRE) ×3 IMPLANT

## 2018-05-09 NOTE — Progress Notes (Signed)
[place], [place] 05/09/18  Subjective:  Patient seen at bedside. He did complete dialysis yesterday. Hemoglobin down to 6.1.  Objective:  Vital signs in last 24 hours:  Temp:  [97.7 F (36.5 C)-99.2 F (37.3 C)] 98.5 F (36.9 C) (03/17 1251) Pulse Rate:  [71-88] 71 (03/17 1251) Resp:  [16-21] 18 (03/17 1251) BP: (73-133)/(41-99) 125/99 (03/17 1251) SpO2:  [98 %-100 %] 100 % (03/17 1251) Weight:  [78.3 kg] 78.3 kg (03/16 1418)  Weight change:  Filed Weights   05/07/18 0500 05/08/18 1042 05/08/18 1418  Weight: 76.5 kg 80 kg 78.3 kg    Intake/Output:    Intake/Output Summary (Last 24 hours) at 05/09/2018 1311 Last data filed at 05/09/2018 1000 Gross per 24 hour  Intake 3551.16 ml  Output 500 ml  Net 3051.16 ml     Physical Exam: General:  no acute distress  HEENT  moist oral mucous membranes  Neck  supple  Pulm/lungs  CTAB normal effort  CVS/Heart  S1S2 no rubs  Abdomen:   Soft, nontender  Extremities:  No edema  Neurologic:  Sedated   Skin:  no acute rashes  Access:  Left upper extremity AV fistula       Basic Metabolic Panel:  Recent Labs  Lab 05/05/18 1611 05/05/18 2043 05/06/18 0048 05/06/18 0522 05/07/18 0458 05/08/18 0523 05/08/18 1103  NA 135 135 135 135 136 131* 128*  K 3.9 3.7 3.7 3.7 3.5 3.7 3.5  CL 102 102 102 103 104 99 97*  CO2 23 24 25 24 26 23  21*  GLUCOSE 108* 153* 124* 107* 95 92 117*  BUN 30* 26* 24* 21 28* 37* 37*  CREATININE 2.49* 2.16* 2.15* 1.92* 3.32* 4.80* 4.92*  CALCIUM 7.8* 7.7* 7.8* 7.9* 7.4* 7.2* 7.1*  MG 1.8 1.9 2.0 1.9 2.0  --   --   PHOS 4.9* 4.2 4.2 3.7 4.5  --  5.7*     CBC: Recent Labs  Lab 05/03/18 1747  05/05/18 0508 05/06/18 0522 05/07/18 0458 05/08/18 0523 05/09/18 0134 05/09/18 1117  WBC 8.8   < > 17.8* 11.5* 8.8 6.4 7.8  --   NEUTROABS 6.1  --   --   --   --   --   --   --   HGB 5.5*   < > 8.6* 8.2* 7.5* 7.2* 7.8* 6.1*  HCT 18.5*   < > 26.1* 25.5* 23.9* 22.2*  24.0*  --   MCV 100.0   < > 90.9 92.7 94.8 94.1 92.3  --   PLT 183   < > 108* 107* 104* 118* 130*  --    < > = values in this interval not displayed.      Lab Results  Component Value Date   HEPBSAG Negative 04/18/2018   HEPBSAB Reactive 02/15/2017      Microbiology:  Recent Results (from the past 240 hour(s))  MRSA PCR Screening     Status: None   Collection Time: 05/03/18  8:59 PM  Result Value Ref Range Status   MRSA by PCR NEGATIVE NEGATIVE Final    Comment:        The GeneXpert MRSA Assay (FDA approved for NASAL specimens only), is one component of a comprehensive MRSA colonization surveillance program. It is not intended to diagnose MRSA infection nor to guide or monitor treatment for MRSA infections. Performed at [facility], [address] [place], [place] [address]     Coagulation Studies: No results for input(s):  LABPROT, INR in the last 72 hours.  Urinalysis: No results for input(s): COLORURINE, LABSPEC, PHURINE, GLUCOSEU, HGBUR, BILIRUBINUR, KETONESUR, PROTEINUR, UROBILINOGEN, NITRITE, LEUKOCYTESUR in the last 72 hours.  Invalid input(s): APPERANCEUR    Imaging: Dg Chest 1 View  Result Date: 05/09/2018 CLINICAL DATA:  Chest pain EXAM: CHEST  1 VIEW COMPARISON:  May 09, 2018 study obtained earlier in the day FINDINGS: There is persistent cardiomegaly with pulmonary venous hypertension. There is a small left pleural effusion. There is patchy interstitial edema. No frank airspace consolidation. No adenopathy. There is aortic atherosclerosis. There is a stent in the left innominate region. No bone lesions. IMPRESSION: Stable findings felt to represent a degree of congestive heart failure. No frank consolidation. Aortic Atherosclerosis (ICD10-I70.0). Electronically Signed   By: Lowella Grip III M.D.   On: 05/09/2018 09:08   Dg Chest Port 1 View  Result Date: 05/09/2018 CLINICAL DATA:  Worsening cough. EXAM: PORTABLE CHEST 1 VIEW  COMPARISON:  05/06/2018 and older exams FINDINGS: Stable cardiomegaly. Lungs show vascular congestion, interstitial thickening and hazy basilar opacity, most evident on the left. These findings are similar to the most recent prior exam consistent with mild congestive heart failure. Suspect small pleural effusions. Stable left subclavian vascular stent. No pneumothorax. IMPRESSION: No significant change from the most recent prior exam. Findings consistent with mild congestive heart failure with cardiomegaly, interstitial edema and small effusions. Electronically Signed   By: Lajean Manes M.D.   On: 05/09/2018 01:49     Medications:   .  ceFAZolin (ANCEF) IV     . sodium chloride   Intravenous Once  . amiodarone  200 mg Oral Daily  . atorvastatin  40 mg Oral q1800  . bismuth subsalicylate  15 mL Oral QID  . chlorhexidine  15 mL Mouth Rinse BID  . Chlorhexidine Gluconate Cloth  6 each Topical Q0600  . epoetin (EPOGEN/PROCRIT) injection  4,000 Units Intravenous Q M,W,F-HD  . feeding supplement (NEPRO CARB STEADY)  237 mL Oral BID BM  . folic acid  1 mg Oral Daily  . mouth rinse  15 mL Mouth Rinse q12n4p  . metroNIDAZOLE  250 mg Oral QID  . multivitamin  1 tablet Oral QHS  . pantoprazole  40 mg Oral BID AC  . tetracycline  500 mg Oral Daily  . vitamin B-12  1,000 mcg Oral Daily  . vitamin C  500 mg Oral BID   acetaminophen **OR** acetaminophen, benzonatate, haloperidol lactate, ipratropium-albuterol, LORazepam, morphine injection, nitroGLYCERIN, [DISCONTINUED] ondansetron **OR** ondansetron (ZOFRAN) IV, polyethylene glycol  Assessment/ Plan:  63 y.o. male withend stage renal disease on hemodialysis, hypertension, hepatitis C, anemia, seizure disorder, COPD/tobacco abuse,substance abuse and alcohol abuse,secondary hyperparathyroidism, chronic systolic CHF  [facility] [address]Left arm AVG. 195 minutes/73.5 kg  1.  End-stage renal disease with severe hyperkalemia Patient  completed dialysis yesterday.  No acute indication for dialysis today.  We will plan for dialysis tomorrow.   2.  Severe anemia of chronic kidney disease and GI bleed Recent upper endoscopy showed 1 cm ulcer in the duodenal bulb and H. pylori antigen positive.  Patient presented again with hematemesis and hematochezia, hypotension and altered mental status.  S/p 3 multiple units of blood transfusion. EGD showed that the esophagus was normal, 1 nonbleeding duodenal ulcer with visible vessel was found.  Coagulation for hemostasis was done and a hemostatic clip was placed by endoscopy. -hemoglobin down to 6.1.  Recommend transfusion but deferred to hospitalist.  3.  Cardiac July 2019  echo moderate to severe mitral regurgitation, calcified mitral valve, 35 to 40%, pulmonary hypertension -pressure greater than 65 mmHg Off pressors  4.  Acute respiratory failure Extubated March 13. - Patient continues to do well off of the ventilator.  5.  Agitation ?  Alcohol withdrawal, appears resolved.           LOS: 6 [name] 3/17/20201:11 PM  [place], [place]  Note: This note was prepared with Dragon dictation. Any transcription errors are unintentional

## 2018-05-09 NOTE — Progress Notes (Signed)
Case discussed with Dr. Durwin Reges, GI.  Location of duodenal ulcer makes surgical intervention difficult. Recommended to hospitalist vascular consult and if emoblization is unsuccessful, recommend urgent transfer to tertiary care center for surgical intervention.

## 2018-05-09 NOTE — Consult Note (Signed)
[facility] Vascular Consult Note  MRN : [id number]  [name] is a 63 y.o. (1955-06-16) male who presents with chief complaint of  Chief Complaint  Patient presents with  . Rectal Bleeding   History of Present Illness:  The patient is a 63 year old male with a past medical history of pulmonary hypertension, peripheral vascular disease, history of myocardial infarction, aortic insufficiency, ischemic and nonischemic cardiomyopathy, just of heart failure, hypertension, hepatitis, diabetes, end-stage renal disease on chronic hemodialysis, coronary artery disease, tobacco abuse who presented to the [facility] on May 03, 2018 by EMS for rectal bleeding.  Initially admitted to the ICU and intubated for hypotension, anemia and respiratory failure. Patient with known history of duodenal ulcer.  The patient is status post endoscopic intervention by GI x2 during his inpatient stay recently with his first on April 20, 2018 that showed a duodenal ulcer.  Patient was continued melena, episodes of coffee-ground emesis and anemia. On March 12th, the patient had another endoscopy with burning and clipping of the duodenal ulcer.   The patient was noted to have a significant drop in his hemoglobin today.  Within 10 hours, the patient's hemoglobin dropped from 7.8-6.1. The patient is very lethargic and unable to provide much history.  Started to experience chest pain this AM. Receiving blood transfusion currently.  Had continued melena x2 today.   Vascular surgery was consulted by Dr. Allen Norris for possible endovascular embolization Current Facility-Administered Medications  Medication Dose Route Frequency Provider Last Rate Last Dose  . 0.9 %  sodium chloride infusion (Manually program via Guardrails IV Fluids)   Intravenous Once Bettey Costa, MD      . acetaminophen (TYLENOL) tablet 650 mg  650 mg Oral Q6H PRN Wilhelmina Mcardle, MD       Or  . acetaminophen  (TYLENOL) suppository 650 mg  650 mg Rectal Q6H PRN Wilhelmina Mcardle, MD      . amiodarone (PACERONE) tablet 200 mg  200 mg Oral Daily Mody, Sital, MD      . atorvastatin (LIPITOR) tablet 40 mg  40 mg Oral q1800 Mody, Sital, MD      . benzonatate (TESSALON) capsule 100 mg  100 mg Oral TID PRN Harrie Foreman, MD   100 mg at 05/09/18 1152  . bismuth subsalicylate (PEPTO BISMOL) 262 MG/15ML suspension 15 mL  15 mL Oral QID Bettey Costa, MD   15 mL at 05/09/18 0841  . ceFAZolin (ANCEF) IVPB 1 g/50 mL premix  1 g Intravenous On Call Stegmayer, Kimberly A, PA-C      . chlorhexidine (PERIDEX) 0.12 % solution 15 mL  15 mL Mouth Rinse BID Wilhelmina Mcardle, MD   15 mL at 05/09/18 0841  . Chlorhexidine Gluconate Cloth 2 % PADS 6 each  6 each Topical Q0600 Murlean Iba, MD   6 each at 05/08/18 0600  . epoetin alfa (EPOGEN,PROCRIT) injection 4,000 Units  4,000 Units Intravenous Q M,W,F-HD Murlean Iba, MD   4,000 Units at 05/08/18 1200  . feeding supplement (NEPRO CARB STEADY) liquid 237 mL  237 mL Oral BID BM Bettey Costa, MD   237 mL at 05/09/18 0842  . folic acid (FOLVITE) tablet 1 mg  1 mg Oral Daily Mody, Sital, MD   1 mg at 05/09/18 0840  . haloperidol lactate (HALDOL) injection 1 mg  1 mg Intravenous Q6H PRN Wilhelmina Mcardle, MD   1 mg at 05/07/18 0012  . ipratropium-albuterol (DUONEB) 0.5-2.5 (3)  MG/3ML nebulizer solution 3 mL  3 mL Nebulization Q4H PRN Darel Hong D, NP      . LORazepam (ATIVAN) injection 0.5 mg  0.5 mg Intravenous Q4H PRN Wilhelmina Mcardle, MD      . MEDLINE mouth rinse  15 mL Mouth Rinse q12n4p Wilhelmina Mcardle, MD   15 mL at 05/08/18 1551  . metroNIDAZOLE (FLAGYL) tablet 250 mg  250 mg Oral QID Lu Duffel, RPH   250 mg at 05/09/18 0840  . morphine 2 MG/ML injection 1 mg  1 mg Intravenous Q2H PRN Mansy, Jan A, MD   1 mg at 05/09/18 7408  . multivitamin (RENA-VIT) tablet 1 tablet  1 tablet Oral QHS Bettey Costa, MD   1 tablet at 05/08/18 2325  . nitroGLYCERIN  (NITROSTAT) SL tablet 0.4 mg  0.4 mg Sublingual Q5 min PRN Mansy, Jan A, MD   0.4 mg at 05/09/18 1448  . ondansetron (ZOFRAN) injection 4 mg  4 mg Intravenous Q6H PRN Mody, Sital, MD      . pantoprazole (PROTONIX) EC tablet 40 mg  40 mg Oral BID AC ShanleverPierce Crane, RPH   40 mg at 05/09/18 0840  . polyethylene glycol (MIRALAX / GLYCOLAX) packet 17 g  17 g Oral Daily PRN Bettey Costa, MD      . tetracycline (ACHROMYCIN,SUMYCIN) capsule 500 mg  500 mg Oral Daily Lu Duffel, RPH   500 mg at 05/08/18 2017  . vitamin B-12 (CYANOCOBALAMIN) tablet 1,000 mcg  1,000 mcg Oral Daily Bettey Costa, MD   1,000 mcg at 05/09/18 0840  . vitamin C (ASCORBIC ACID) tablet 500 mg  500 mg Oral BID Bettey Costa, MD   500 mg at 05/09/18 1856   Past Medical History:  Diagnosis Date  . Arthritis   . CAD (coronary artery disease)    a. 09/2013 Myoview [facility]): basal inf defect more pronounced @ rest, likely artifact, EF 48%, low risk study; b. 06/2016 Cath [facility]): LAD 10p, 30-54m, 40-50d, D1 90(small), D2 40, D3 60-70, D4 30, LCX 10p, 65m, 20d, Om1 30, OM3 40, RCA 100p w/ bridging collats, m/d RCA fill via collats, small/mod caliber-->Med Rx; c. 02/2017 MV: inf infarct w/ peri-inf ischemia. EF <30%.   . Chronic combined systolic and diastolic CHF (congestive heart failure) ([place])    a. 10/2013 Echo [facility]): EF 50%, diast dysfxn;  b. 10/2014 Echo: EF 30-35%, diff HK, gr1 DD;  c. 04/2016 Echo: EF 20-25%, sev dil LV, diff HK, gr3 DD; d. 06/2016 Echo: EF 30-35%, Gr2 DD.  . Diabetes ([place])   . Dyspnea   . ESRD (end stage renal disease) ([place])    a. MWF dialysis @ [facility].  . Hepatitis    Patient is unsure type of Hepatitis he has  . Hypertension   . Hypertensive heart disease with CHF (congestive heart failure) ([place])   . Mixed Ischemic and non-ischemic cardiomyopathy    a. 10/2013 Echo [facility]): EF 50%, diast dysfxn;  b. 10/2014 Echo: EF 30-35%, diff HK, gr1 DD;  c. 04/2016 Echo: EF 20-25%, sev dil LV, diff HK, gr3 DD; d.  06/2016 Echo: EF 30-35%, Gr2 DD.  [name] Moderate Aortic insufficiency    a. 04/2016 Echo: Mod AI.  [name] Myocardial infarction ([place])   . Peripheral vascular disease ([place])   . Pulmonary hypertension ([place])    a. 10/2014 Echo: Mod-Sev PAH.  [name] PVC's (premature ventricular contractions)    a. 02/2017 24 hr holter: freq polymorphic PVCs w/ total  of 38K beats in 24 hrs (34% burden).   Past Surgical History:  Procedure Laterality Date  . A/V FISTULAGRAM Left 04/06/2016   Procedure: A/V Fistulagram;  Surgeon: Katha Cabal, MD;  Location: [place] [facility];  Service: Cardiovascular;  Laterality: Left;  . A/V FISTULAGRAM Left 01/19/2017   Procedure: A/V FISTULAGRAM;  Surgeon: Katha Cabal, MD;  Location: [place] [facility];  Service: Cardiovascular;  Laterality: Left;  . A/V SHUNT INTERVENTION N/A 04/06/2016   Procedure: A/V Shunt Intervention;  Surgeon: Katha Cabal, MD;  Location: [place] [facility];  Service: Cardiovascular;  Laterality: N/A;  . A/V SHUNT INTERVENTION N/A 06/29/2016   Procedure: A/V Shunt Intervention;  Surgeon: Katha Cabal, MD;  Location: [place] [facility];  Service: Cardiovascular;  Laterality: N/A;  . AV FISTULA PLACEMENT Left 2014  . AV FISTULA PLACEMENT Left 06/01/2017   Procedure: INSERTION OF ARTERIOVENOUS (AV) GORE-TEX GRAFT ARM ( BRACHIAL AXILLARY );  Surgeon: Katha Cabal, MD;  Location: [facility] ORS;  Service: Vascular;  Laterality: Left;  . DIALYSIS/PERMA CATHETER INSERTION Right    Dr. Delana Meyer  . DIALYSIS/PERMA CATHETER REMOVAL N/A 07/05/2017   Procedure: DIALYSIS/PERMA CATHETER REMOVAL;  Surgeon: Katha Cabal, MD;  Location: [place] [facility];  Service: Cardiovascular;  Laterality: N/A;  . ESOPHAGOGASTRODUODENOSCOPY N/A 05/04/2018   Procedure: ESOPHAGOGASTRODUODENOSCOPY (EGD);  Surgeon: Virgel Manifold, MD;  Location: [facility];  Service: Gastroenterology;  Laterality: N/A;  . ESOPHAGOGASTRODUODENOSCOPY (EGD) WITH PROPOFOL N/A  04/20/2018   Procedure: ESOPHAGOGASTRODUODENOSCOPY (EGD) WITH PROPOFOL;  Surgeon: Jonathon Bellows, MD;  Location: [facility] ENDOSCOPY;  Service: Gastroenterology;  Laterality: N/A;  . PERIPHERAL VASCULAR CATHETERIZATION N/A 10/03/2014   Procedure: A/V Shuntogram/Fistulagram;  Surgeon: Algernon Huxley, MD;  Location: [place] [facility];  Service: Cardiovascular;  Laterality: N/A;  . PERIPHERAL VASCULAR CATHETERIZATION Left 10/03/2014   Procedure: A/V Shunt Intervention;  Surgeon: Algernon Huxley, MD;  Location: [place] [facility];  Service: Cardiovascular;  Laterality: Left;  . PERIPHERAL VASCULAR CATHETERIZATION Left 05/01/2015   Procedure: A/V Shuntogram/Fistulagram;  Surgeon: Algernon Huxley, MD;  Location: [place] [facility];  Service: Cardiovascular;  Laterality: Left;  . PERIPHERAL VASCULAR CATHETERIZATION N/A 05/01/2015   Procedure: A/V Shunt Intervention;  Surgeon: Algernon Huxley, MD;  Location: [place] [facility];  Service: Cardiovascular;  Laterality: N/A;  . UPPER EXTREMITY ANGIOGRAPHY Bilateral 06/29/2016   Procedure: Upper Extremity Angiography;  Surgeon: Katha Cabal, MD;  Location: [place] [facility];  Service: Cardiovascular;  Laterality: Bilateral;  . UPPER EXTREMITY INTERVENTION  06/29/2016   Procedure: Upper Extremity Intervention;  Surgeon: Katha Cabal, MD;  Location: [place] [facility];  Service: Cardiovascular;;  . UPPER EXTREMITY VENOGRAPHY Left 03/15/2017   Procedure: UPPER EXTREMITY VENOGRAPHY;  Surgeon: Katha Cabal, MD;  Location: [place] [facility];  Service: Cardiovascular;  Laterality: Left;   Social History Social History   Tobacco Use  . Smoking status: Former Smoker    Packs/day: 0.25    Years: 40.00    Pack years: 10.00    Types: Cigarettes    Last attempt to quit: 10/12/2016    Years since quitting: 1.5  . Smokeless tobacco: Never Used  Substance Use Topics  . Alcohol use: No    Alcohol/week: 0.0 standard drinks  . Drug use: Yes    Types: Cocaine,  Marijuana    Comment: patient states no drugs   Family History Family History  Problem Relation Age of Onset  .  Hypertension Son   . Diabetes Son   . Hypertension Mother   . Diabetes Mother   . Diabetes Sister   Denies any family history of peripheral artery disease, venous disease or bleeding/clotting disorders.  Allergies  Allergen Reactions  . Sulfa Antibiotics Rash and Shortness Of Breath   REVIEW OF SYSTEMS (Negative unless checked)  Constitutional: [] Weight loss  [] Fever  [] Chills Cardiac: [x] Chest pain   [x] Chest pressure   [] Palpitations   [x] Shortness of breath when laying flat   [x] Shortness of breath at rest   [x] Shortness of breath with exertion. Vascular:  [] Pain in legs with walking   [] Pain in legs at rest   [] Pain in legs when laying flat   [] Claudication   [] Pain in feet when walking  [] Pain in feet at rest  [] Pain in feet when laying flat   [] History of DVT   [] Phlebitis   [] Swelling in legs   [] Varicose veins   [] Non-healing ulcers Pulmonary:   [] Uses home oxygen   [] Productive cough   [] Hemoptysis   [] Wheeze  [] COPD   [] Asthma Neurologic:  [] Dizziness  [] Blackouts   [] Seizures   [] History of stroke   [] History of TIA  [] Aphasia   [] Temporary blindness   [] Dysphagia   [] Weakness or numbness in arms   [] Weakness or numbness in legs Musculoskeletal:  [] Arthritis   [] Joint swelling   [] Joint pain   [] Low back pain Hematologic:  [] Easy bruising  [] Easy bleeding   [] Hypercoagulable state   [] Anemic  [] Hepatitis Gastrointestinal:  [x] Blood in stool   [x] Vomiting blood  [] Gastroesophageal reflux/heartburn   [] Difficulty swallowing. Genitourinary:  [x] Chronic kidney disease   [] Difficult urination  [] Frequent urination  [] Burning with urination   [] Blood in urine Skin:  [] Rashes   [] Ulcers   [] Wounds Psychological:  [] History of anxiety   []  History of major depression.  Physical Examination  Vitals:   05/09/18 1136 05/09/18 1221 05/09/18 1222 05/09/18 1251  BP: (!)  88/48 (!) 73/41 (!) 84/42 (!) 125/99  Pulse:  74  71  Resp:  18  18  Temp:  98.5 F (36.9 C)  98.5 F (36.9 C)  TempSrc:  Oral  Oral  SpO2:  100%  100%  Weight:      Height:       Body mass index is 23.41 kg/m. Gen:  Lethargic but arousable Head: [place]/AT, No temporalis wasting. Prominent temp pulse not noted. Ear/Nose/Throat: Hearing grossly intact, nares w/o erythema or drainage, oropharynx w/o Erythema/Exudate Eyes: Sclera non-icteric, conjunctiva clear Neck: Trachea midline.  No JVD.  Pulmonary:  Good air movement, respirations not labored, equal bilaterally.  Cardiac: RRR, normal S1, S2. Vascular:  Vessel Right Left  Radial Palpable Palpable  Ulnar Palpable Palpable  Brachial Palpable Palpable  Carotid Palpable, without bruit Palpable, without bruit  Aorta Not palpable N/A  Femoral Palpable Palpable  Popliteal Palpable Palpable  PT Palpable Palpable  DP Palpable Palpable   Left Upper Extremity: Fistula with bruit and thrill  Gastrointestinal: soft, non-tender, mild distention. No guarding/reflex.  Musculoskeletal: M/S 5/5 throughout. No deformity or atrophy.  Neurologic: Sensation grossly intact in extremities.  Symmetrical.  Speech is fluent. Motor exam as listed above. Psychiatric: Judgment intact, Mood & affect appropriate for pt's clinical situation. Dermatologic: No rashes or ulcers noted.  No cellulitis or open wounds. Lymph : No Cervical, Axillary, or Inguinal lymphadenopathy.  CBC Lab Results  Component Value Date   WBC 7.8 05/09/2018   HGB 6.1 (L) 05/09/2018   HCT 24.0 (L)  05/09/2018   MCV 92.3 05/09/2018   PLT 130 (L) 05/09/2018   BMET    Component Value Date/Time   NA 128 (L) 05/08/2018 1103   NA 139 04/30/2013 1203   K 3.5 05/08/2018 1103   K 4.4 04/30/2013 1203   CL 97 (L) 05/08/2018 1103   CL 107 04/30/2013 1203   CO2 21 (L) 05/08/2018 1103   CO2 25 04/30/2013 1203   GLUCOSE 117 (H) 05/08/2018 1103   GLUCOSE 104 (H) 04/30/2013 1203   BUN  37 (H) 05/08/2018 1103   BUN 76 (H) 04/30/2013 1203   CREATININE 4.92 (H) 05/08/2018 1103   CREATININE 9.49 (H) 04/30/2013 1203   CALCIUM 7.1 (L) 05/08/2018 1103   CALCIUM 8.0 (L) 04/30/2013 1203   GFRNONAA 12 (L) 05/08/2018 1103   GFRNONAA 5 (L) 04/30/2013 1203   GFRAA 13 (L) 05/08/2018 1103   GFRAA 6 (L) 04/30/2013 1203   Estimated Creatinine Clearance: 16.6 mL/min (A) (by C-G formula based on SCr of 4.92 mg/dL (H)).  COAG Lab Results  Component Value Date   INR 0.97 06/01/2017   INR 1.04 03/31/2017   INR 1.10 07/27/2016   Radiology Dg Chest 1 View  Result Date: 05/09/2018 CLINICAL DATA:  Chest pain EXAM: CHEST  1 VIEW COMPARISON:  May 09, 2018 study obtained earlier in the day FINDINGS: There is persistent cardiomegaly with pulmonary venous hypertension. There is a small left pleural effusion. There is patchy interstitial edema. No frank airspace consolidation. No adenopathy. There is aortic atherosclerosis. There is a stent in the left innominate region. No bone lesions. IMPRESSION: Stable findings felt to represent a degree of congestive heart failure. No frank consolidation. Aortic Atherosclerosis (ICD10-I70.0). Electronically Signed   By: Lowella Grip III M.D.   On: 05/09/2018 09:08   Dg Lumbar Spine 2-3 Views  Result Date: 04/21/2018 CLINICAL DATA:  Acute lower back pain after fall 2 weeks ago. EXAM: LUMBAR SPINE - 2-3 VIEW COMPARISON:  None. FINDINGS: No fracture or spondylolisthesis is noted. Moderate degenerative disc disease is noted at L2-3 and L3-4 with anterior osteophyte formation. Large anterior osteophyte formation is noted at L4-5. Atherosclerosis of abdominal aorta is noted. IMPRESSION: Moderate multilevel degenerative disc disease. No acute abnormality seen in the lumbar spine. Aortic Atherosclerosis (ICD10-I70.0). Electronically Signed   By: Marijo Conception, M.D.   On: 04/21/2018 13:38   Ct Head Wo Contrast  Result Date: 05/04/2018 CLINICAL DATA:  63 y/o   M; hematemesis and altered mental status. EXAM: CT HEAD WITHOUT CONTRAST TECHNIQUE: Contiguous axial images were obtained from the base of the skull through the vertex without intravenous contrast. COMPARISON:  04/27/2013 CT head. FINDINGS: Brain: No evidence of acute infarction, hemorrhage, hydrocephalus, extra-axial collection or mass lesion/mass effect. Stable nonspecific white matter hypodensities compatible chronic microvascular ischemic changes and stable volume loss of the brain. Vascular: Calcific atherosclerosis of carotid siphons and the vertebral arteries. No hyperdense vessel is evident. Skull: Normal. Negative for fracture or focal lesion. Sinuses/Orbits: No acute finding. Other: None. IMPRESSION: 1. No acute intracranial abnormality identified. 2. Stable chronic microvascular ischemic changes and volume loss of the brain. Electronically Signed   By: Kristine Garbe M.D.   On: 05/04/2018 00:52   Dg Chest Port 1 View  Result Date: 05/09/2018 CLINICAL DATA:  Worsening cough. EXAM: PORTABLE CHEST 1 VIEW COMPARISON:  05/06/2018 and older exams FINDINGS: Stable cardiomegaly. Lungs show vascular congestion, interstitial thickening and hazy basilar opacity, most evident on the left. These findings are similar to  the most recent prior exam consistent with mild congestive heart failure. Suspect small pleural effusions. Stable left subclavian vascular stent. No pneumothorax. IMPRESSION: No significant change from the most recent prior exam. Findings consistent with mild congestive heart failure with cardiomegaly, interstitial edema and small effusions. Electronically Signed   By: Lajean Manes M.D.   On: 05/09/2018 01:49   Dg Chest Port 1 View  Result Date: 05/06/2018 CLINICAL DATA:  Respiratory failure EXAM: PORTABLE CHEST 1 VIEW COMPARISON:  05/05/2018 FINDINGS: Cardiomegaly with suspected mild interstitial edema, increased. Layering small bilateral pleural effusions, increased. No  pneumothorax. Left vascular stent. IMPRESSION: Cardiomegaly with suspected mild interstitial edema and small bilateral pleural effusions, increased. Electronically Signed   By: Julian Hy M.D.   On: 05/06/2018 05:16   Dg Chest Port 1 View  Result Date: 05/05/2018 CLINICAL DATA:  Respiratory failure EXAM: PORTABLE CHEST 1 VIEW COMPARISON:  Yesterday FINDINGS: Endotracheal tube tip just below the clavicular heads. The orogastric tube reaches the stomach. Bilateral lung opacity that is increased. No Kerley lines, effusion, or pneumothorax. Left brachiocephalic venous stent. IMPRESSION: 1. Unremarkable hardware positioning. 2. Increased lung opacity from atelectasis or pneumonia. 3. Cardiomegaly. Electronically Signed   By: Monte Fantasia M.D.   On: 05/05/2018 06:42   Portable Chest Xray  Result Date: 05/04/2018 CLINICAL DATA:  Central line placement EXAM: PORTABLE CHEST 1 VIEW COMPARISON:  05/03/2018 FINDINGS: Endotracheal tube 4 cm from carina. NG tube extends the stomach. Brachycephalic vein stent noted. No central venous line. IMPRESSION: Endotracheal tube and NG tube in good position Electronically Signed   By: Suzy Bouchard M.D.   On: 05/04/2018 13:15   Dg Chest Portable 1 View  Result Date: 05/03/2018 CLINICAL DATA:  Endotracheal tube placement EXAM: PORTABLE CHEST 1 VIEW COMPARISON:  02/07/2018 FINDINGS: Endotracheal tube tip is at the level of the clavicular heads. Orogastric tube tip and side port project over the gastric fundus. There is mild cardiomegaly. No focal airspace consolidation or pulmonary edema. IMPRESSION: 1. ET tube tip at the level of the clavicular heads. 2. OG tube tip and side port project over the gastric fundus. Electronically Signed   By: Ulyses Jarred M.D.   On: 05/03/2018 20:05   Dg Abd Portable 1v  Result Date: 05/04/2018 CLINICAL DATA:  Orogastric tube placement. EXAM: PORTABLE ABDOMEN - 1 VIEW COMPARISON:  Body CT 05/04/2018 FINDINGS: The bowel gas pattern  is normal. Enteric catheter within the expected location of the gastric body. Vascular calcifications noted. Bilateral iliac catheter seen. IMPRESSION: Enteric catheter within the expected location of the gastric body. Electronically Signed   By: Fidela Salisbury M.D.   On: 05/04/2018 13:42   Ct Angio Abd/pel W/ And/or W/o  Result Date: 05/04/2018 CLINICAL DATA:  Altered mental status, gastrointestinal bleeding. Hematemesis and hematochezia with hypotension. Patient underwent hemodialysis today. EXAM: CTA ABDOMEN AND PELVIS WITHOUT AND WITH CONTRAST TECHNIQUE: Multidetector CT imaging of the abdomen and pelvis was performed using the standard protocol during bolus administration of intravenous contrast. Multiplanar reconstructed images and MIPs were obtained and reviewed to evaluate the vascular anatomy. CONTRAST:  118mL ISOVUE-370 IOPAMIDOL (ISOVUE-370) INJECTION 76% COMPARISON:  None. FINDINGS: VASCULAR Aorta: Moderate to marked calcific atherosclerosis of the thoracic aorta without aneurysmal the aneurysm or dissection. Celiac: Patent without significant stenosis. Scattered atherosclerotic calcified plaque noted. SMA: Atherosclerotic without aneurysm or dissection. Renals: Densely calcified at the origins of both renal arteries. No aneurysm. No definite dissection. IMA: Patent Inflow: Atherosclerotic and patent without significant stenosis, aneurysm or dissection.  Proximal Outflow: Bilateral common femoral and visualized portions of the superficial and profunda femoral arteries are patent without evidence of aneurysm, dissection, vasculitis or significant stenosis. Moderate atherosclerosis noted bilaterally. Veins: Central line catheter in the left common iliac vein. Review of the MIP images confirms the above findings. NON-VASCULAR Lower chest: Cardiomegaly is noted with coronary arteriosclerosis. No pericardial effusion or thickening. Pulmonary consolidation at the left lung base likely representing  atelectatic change. Gastric tube is noted within the esophagus extending into the stomach. Hepatobiliary: Physiologic distention of the gallbladder without stones. No enhancing mass or biliary dilatation of the liver. Pancreas: Normal without ductal dilatation, mass or inflammation. Spleen: Normal size spleen.  No mass. Adrenals/Urinary Tract: Normal bilateral adrenal glands. Atrophic kidneys with renovascular calcifications. No renal mass nor obstructive uropathy. No hydroureteronephrosis. The urinary bladder is decompressed. Stomach/Bowel: A gastric tube is seen within the physiologically distended stomach. No significant rugal thickening to suggest gastritis. The duodenal bulb, sweep and ligament Treitz are normal. No significant bowel distention or inflammation. No obstruction is noted. The colon is physiologically distended with stool. Mild diffuse mural thickening along the descending and sigmoid colon is likely due to underdistention and less likely inflammatory change. The appendix is nondistended and unremarkable. Lymphatic: No lymphadenopathy by CT size criteria. Reproductive: Mildly enlarged prostate measuring 4.7 x 4.2 cm on series 6/196. Other: Mild soft tissue anasarca.  No free air nor free fluid. Musculoskeletal: Diffuse sclerosis of the included axial and appendicular skeleton consistent with changes of chronic renal osteodystrophy. Degenerative disc disease of the thoracolumbar spine greatest from L2 through L5. IMPRESSION: VASCULAR 1. Atherosclerotic calcifications of the abdominal aorta without aneurysm or dissection. Patent atherosclerotic branch vessels as above. 2. No evidence of active hemorrhage. NON-VASCULAR 1. No CT findings to explain the patient's hemoptysis nor hematochezia. 2. Mild soft tissue anasarca. 3. Cardiomegaly with coronary arteriosclerosis. Pulmonary consolidation medially at left base. 4. Atrophic bilateral kidneys. Electronically Signed   By: Ashley Royalty M.D.   On:  05/04/2018 01:11   Assessment/Plan The patient is a 63 year old male with a past medical history of pulmonary hypertension, peripheral vascular disease, history of myocardial infarction, aortic insufficiency, ischemic and nonischemic cardiomyopathy, just of heart failure, hypertension, hepatitis, diabetes, end-stage renal disease on chronic hemodialysis, coronary artery disease, tobacco abuse with known and recurrent duodenal ulcer bleeding 1.  Bleeding duodenal ulcer: Patient with endoscopic intervention x2 by gastroenterology.  Patient continues to experience anemia requiring blood transfusions, hypotension, chest pain, lethargy, and melena.  Significant drop in hemoglobin over the last 10 hours.  Will attempt endovascular embolization of the patient's duodenal ulcer.  Procedure, risks and benefits explained to the patient all questions answered patient wishes to proceed. 2. ESRD: Patient's left forearm fistula seems to be functioning without issue.  No indication for any intervention at this time. 3. Tobacco Abuse: We had a discussion for approximately three minutes regarding the absolute need for smoking cessation due to the deleterious nature of tobacco on the vascular system. We discussed the tobacco use would diminish patency of any intervention, and likely significantly worsen progressio of disease. We discussed multiple agents for quitting including replacement therapy or medications to reduce cravings such as Chantix. The patient voices their understanding of the importance of smoking cessation. 4. Diabetes: Appropriate medications. Encouraged good control as its slows the progression of atherosclerotic disease  Discussed with Dr. Francene Castle, PA-C  05/09/2018 1:07 PM  This note was created with Dragon medical transcription system.  Any error  is purely unintentional

## 2018-05-09 NOTE — Progress Notes (Signed)
Pt's fluids returned to [facility] per Dr.Schnier's request

## 2018-05-09 NOTE — Progress Notes (Signed)
Lab called about elevated troponin level of 0.94. Dr Benjie Karvonen notified of results. She said she was aware of troponin, EKG and patient's status. She said she had talked to cardiology and they would up soon to see the patient.

## 2018-05-09 NOTE — Consult Note (Signed)
CRITICAL CARE NOTE      CHIEF COMPLAINT:   Acute blood loss anemia   HPI   Is a pleasant 63 year old male with a history of ESRD on HD, diabetes, pulmonary hypertension, hepatitis, aortic insufficiency peripheral vascular disease history of hematochezia and hematemesis secondary to duodenal ulcer status post GI evaluation with burning and lipping of ulcer which has been refractory to medical therapy.  He also has a background history of systolic and diastolic CHF.  He came into the ED due to recurrent bright red blood per rectum as well as worsening mentation and was noted to be lethargic unable to protect airway and was intubated in the emergency room.  He was evaluated by surgery and duodenal ulcer was deemed not amenable to surgical intervention.  Vascular surgery has been consulted for possible embolization.  Patient was brought to the medical intensive care unit with acute blood loss anemia requiring transfusion.  Currently is hemodynamically stable with mild hypotension with a map 70 actively being transfused due to hemoglobin of 6.8.   PAST MEDICAL HISTORY   Past Medical History:  Diagnosis Date  . Arthritis   . CAD (coronary artery disease)    a. 09/2013 Myoview [facility]): basal inf defect more pronounced @ rest, likely artifact, EF 48%, low risk study; b. 06/2016 Cath [facility]): LAD 10p, 30-76m, 40-50d, D1 90(small), D2 40, D3 60-70, D4 30, LCX 10p, 41m, 20d, Om1 30, OM3 40, RCA 100p w/ bridging collats, m/d RCA fill via collats, small/mod caliber-->Med Rx; c. 02/2017 MV: inf infarct w/ peri-inf ischemia. EF <30%.   . Chronic combined systolic and diastolic CHF (congestive heart failure) ([place])    a. 10/2013 Echo [facility]): EF 50%, diast dysfxn;  b. 10/2014 Echo: EF 30-35%, diff HK, gr1 DD;  c. 04/2016 Echo: EF 20-25%, sev dil LV,  diff HK, gr3 DD; d. 06/2016 Echo: EF 30-35%, Gr2 DD.  . Diabetes ([place])   . Dyspnea   . ESRD (end stage renal disease) ([place])    a. MWF dialysis @ [facility].  . Hepatitis    Patient is unsure type of Hepatitis he has  . Hypertension   . Hypertensive heart disease with CHF (congestive heart failure) ([place])   . Mixed Ischemic and non-ischemic cardiomyopathy    a. 10/2013 Echo [facility]): EF 50%, diast dysfxn;  b. 10/2014 Echo: EF 30-35%, diff HK, gr1 DD;  c. 04/2016 Echo: EF 20-25%, sev dil LV, diff HK, gr3 DD; d. 06/2016 Echo: EF 30-35%, Gr2 DD.  [name] Moderate Aortic insufficiency    a. 04/2016 Echo: Mod AI.  [name] Myocardial infarction ([place])   . Peripheral vascular disease ([place])   . Pulmonary hypertension ([place])    a. 10/2014 Echo: Mod-Sev PAH.  [name] PVC's (premature ventricular contractions)    a. 02/2017 24 hr holter: freq polymorphic PVCs w/ total of 38K beats in 24 hrs (34% burden).     SURGICAL HISTORY   Past Surgical History:  Procedure Laterality Date  . A/V FISTULAGRAM Left 04/06/2016   Procedure: A/V Fistulagram;  Surgeon: Katha Cabal, MD;  Location: [place] [facility];  Service: Cardiovascular;  Laterality: Left;  . A/V FISTULAGRAM Left 01/19/2017   Procedure: A/V FISTULAGRAM;  Surgeon: Katha Cabal, MD;  Location: [place] [facility];  Service: Cardiovascular;  Laterality: Left;  . A/V SHUNT INTERVENTION N/A 04/06/2016   Procedure: A/V Shunt Intervention;  Surgeon: Katha Cabal, MD;  Location: [place] [facility];  Service: Cardiovascular;  Laterality: N/A;  .  A/V SHUNT INTERVENTION N/A 06/29/2016   Procedure: A/V Shunt Intervention;  Surgeon: Katha Cabal, MD;  Location: [place] [facility];  Service: Cardiovascular;  Laterality: N/A;  . AV FISTULA PLACEMENT Left 2014  . AV FISTULA PLACEMENT Left 06/01/2017   Procedure: INSERTION OF ARTERIOVENOUS (AV) GORE-TEX GRAFT ARM ( BRACHIAL AXILLARY );  Surgeon: Katha Cabal, MD;  Location: [facility] ORS;  Service:  Vascular;  Laterality: Left;  . DIALYSIS/PERMA CATHETER INSERTION Right    Dr. Delana Meyer  . DIALYSIS/PERMA CATHETER REMOVAL N/A 07/05/2017   Procedure: DIALYSIS/PERMA CATHETER REMOVAL;  Surgeon: Katha Cabal, MD;  Location: [place] [facility];  Service: Cardiovascular;  Laterality: N/A;  . ESOPHAGOGASTRODUODENOSCOPY N/A 05/04/2018   Procedure: ESOPHAGOGASTRODUODENOSCOPY (EGD);  Surgeon: Virgel Manifold, MD;  Location: [facility] ENDOSCOPY;  Service: Gastroenterology;  Laterality: N/A;  . ESOPHAGOGASTRODUODENOSCOPY (EGD) WITH PROPOFOL N/A 04/20/2018   Procedure: ESOPHAGOGASTRODUODENOSCOPY (EGD) WITH PROPOFOL;  Surgeon: Jonathon Bellows, MD;  Location: [facility] ENDOSCOPY;  Service: Gastroenterology;  Laterality: N/A;  . PERIPHERAL VASCULAR CATHETERIZATION N/A 10/03/2014   Procedure: A/V Shuntogram/Fistulagram;  Surgeon: Algernon Huxley, MD;  Location: [place] [facility];  Service: Cardiovascular;  Laterality: N/A;  . PERIPHERAL VASCULAR CATHETERIZATION Left 10/03/2014   Procedure: A/V Shunt Intervention;  Surgeon: Algernon Huxley, MD;  Location: [place] [facility];  Service: Cardiovascular;  Laterality: Left;  . PERIPHERAL VASCULAR CATHETERIZATION Left 05/01/2015   Procedure: A/V Shuntogram/Fistulagram;  Surgeon: Algernon Huxley, MD;  Location: [place] [facility];  Service: Cardiovascular;  Laterality: Left;  . PERIPHERAL VASCULAR CATHETERIZATION N/A 05/01/2015   Procedure: A/V Shunt Intervention;  Surgeon: Algernon Huxley, MD;  Location: [place] [facility];  Service: Cardiovascular;  Laterality: N/A;  . UPPER EXTREMITY ANGIOGRAPHY Bilateral 06/29/2016   Procedure: Upper Extremity Angiography;  Surgeon: Katha Cabal, MD;  Location: [place] [facility];  Service: Cardiovascular;  Laterality: Bilateral;  . UPPER EXTREMITY INTERVENTION  06/29/2016   Procedure: Upper Extremity Intervention;  Surgeon: Katha Cabal, MD;  Location: [place] [facility];  Service: Cardiovascular;;  . UPPER EXTREMITY VENOGRAPHY Left  03/15/2017   Procedure: UPPER EXTREMITY VENOGRAPHY;  Surgeon: Katha Cabal, MD;  Location: [place] [facility];  Service: Cardiovascular;  Laterality: Left;     FAMILY HISTORY   Family History  Problem Relation Age of Onset  . Hypertension Son   . Diabetes Son   . Hypertension Mother   . Diabetes Mother   . Diabetes Sister      SOCIAL HISTORY   Social History   Tobacco Use  . Smoking status: Former Smoker    Packs/day: 0.25    Years: 40.00    Pack years: 10.00    Types: Cigarettes    Last attempt to quit: 10/12/2016    Years since quitting: 1.5  . Smokeless tobacco: Never Used  Substance Use Topics  . Alcohol use: No    Alcohol/week: 0.0 standard drinks  . Drug use: Yes    Types: Cocaine, Marijuana    Comment: patient states no drugs     MEDICATIONS   Current Medication:  Current Facility-Administered Medications:  .  0.9 %  sodium chloride infusion (Manually program via Guardrails IV Fluids), , Intravenous, Once, Schnier, Dolores Lory, MD .  acetaminophen (TYLENOL) tablet 650 mg, 650 mg, Oral, Q6H PRN **OR** acetaminophen (TYLENOL) suppository 650 mg, 650 mg, Rectal, Q6H PRN, Schnier, Dolores Lory, MD .  amiodarone (PACERONE) tablet 200 mg, 200 mg, Oral, Daily, Schnier, Dolores Lory, MD .  atorvastatin (LIPITOR) tablet 40 mg, 40 mg, Oral, q1800, Schnier, Dolores Lory, MD .  benzonatate (TESSALON) capsule 100 mg, 100 mg, Oral, TID PRN, Delana Meyer Dolores Lory, MD, 100 mg at 05/09/18 1152 .  bismuth subsalicylate (PEPTO BISMOL) 262 MG/15ML suspension 15 mL, 15 mL, Oral, QID, Schnier, Dolores Lory, MD, 15 mL at 05/09/18 0841 .  chlorhexidine (PERIDEX) 0.12 % solution 15 mL, 15 mL, Mouth Rinse, BID, Schnier, Dolores Lory, MD, 15 mL at 05/09/18 0841 .  [START ON 05/10/2018] Chlorhexidine Gluconate Cloth 2 % PADS 6 each, 6 each, Topical, Daily, Lanney Gins, Kier Smead, MD .  epoetin alfa (EPOGEN,PROCRIT) injection 4,000 Units, 4,000 Units, Intravenous, Q M,W,F-HD, Schnier, Dolores Lory, MD, 4,000  Units at 05/08/18 1200 .  feeding supplement (NEPRO CARB STEADY) liquid 237 mL, 237 mL, Oral, BID BM, Schnier, Dolores Lory, MD, 237 mL at 05/09/18 0842 .  folic acid (FOLVITE) tablet 1 mg, 1 mg, Oral, Daily, Schnier, Dolores Lory, MD, 1 mg at 05/09/18 0840 .  haloperidol lactate (HALDOL) injection 1 mg, 1 mg, Intravenous, Q6H PRN, Schnier, Dolores Lory, MD, 1 mg at 05/07/18 0012 .  ipratropium-albuterol (DUONEB) 0.5-2.5 (3) MG/3ML nebulizer solution 3 mL, 3 mL, Nebulization, Q4H PRN, Schnier, Dolores Lory, MD .  LORazepam (ATIVAN) injection 0.5 mg, 0.5 mg, Intravenous, Q4H PRN, Schnier, Dolores Lory, MD .  MEDLINE mouth rinse, 15 mL, Mouth Rinse, q12n4p, Schnier, Dolores Lory, MD, 15 mL at 05/08/18 1551 .  metroNIDAZOLE (FLAGYL) tablet 250 mg, 250 mg, Oral, QID, Schnier, Dolores Lory, MD, 250 mg at 05/09/18 0840 .  morphine 2 MG/ML injection 1 mg, 1 mg, Intravenous, Q2H PRN, Schnier, Dolores Lory, MD, 1 mg at 05/09/18 [phone] .  multivitamin (RENA-VIT) tablet 1 tablet, 1 tablet, Oral, QHS, Schnier, Dolores Lory, MD, 1 tablet at 05/08/18 2325 .  nitroGLYCERIN (NITROSTAT) SL tablet 0.4 mg, 0.4 mg, Sublingual, Q5 min PRN, Schnier, Dolores Lory, MD, 0.4 mg at 05/09/18 4696 .  [DISCONTINUED] ondansetron (ZOFRAN) tablet 4 mg, 4 mg, Oral, Q6H PRN **OR** ondansetron (ZOFRAN) injection 4 mg, 4 mg, Intravenous, Q6H PRN, Schnier, Dolores Lory, MD .  ondansetron [facility]) injection 4 mg, 4 mg, Intravenous, Q6H PRN, Schnier, Dolores Lory, MD, 4 mg at 05/09/18 1659 .  pantoprazole (PROTONIX) EC tablet 40 mg, 40 mg, Oral, BID AC, Schnier, Dolores Lory, MD, 40 mg at 05/09/18 0840 .  polyethylene glycol (MIRALAX / GLYCOLAX) packet 17 g, 17 g, Oral, Daily PRN, Schnier, Dolores Lory, MD .  tetracycline (ACHROMYCIN,SUMYCIN) capsule 500 mg, 500 mg, Oral, Daily, Schnier, Dolores Lory, MD, 500 mg at 05/08/18 2017 .  vitamin B-12 (CYANOCOBALAMIN) tablet 1,000 mcg, 1,000 mcg, Oral, Daily, Schnier, Dolores Lory, MD, 1,000 mcg at 05/09/18 0840 .  vitamin C (ASCORBIC ACID)  tablet 500 mg, 500 mg, Oral, BID, Schnier, Dolores Lory, MD, 500 mg at 05/09/18 2952    ALLERGIES   Sulfa antibiotics    REVIEW OF SYSTEMS    Unable to provide ROS due to acute illness  PHYSICAL EXAMINATION   Vitals:   05/09/18 1730 05/09/18 1745  BP: 103/60 (!) 102/52  Pulse: 74 74  Resp: 14 13  Temp:  36.6 C  SpO2: 100% 100%    GENERAL: No apparent distress HEAD: Normocephalic, atraumatic.  EYES: Pupils equal, round, reactive to light.  No scleral icterus.  MOUTH: Moist mucosal membrane. NECK: Supple. No thyromegaly. No nodules. No JVD.  PULMONARY: There to auscultation bilaterally CARDIOVASCULAR: S1 and S2. Regular rate and rhythm. No murmurs, rubs, or gallops.  GASTROINTESTINAL:  Soft, nontender, non-distended. No masses. Positive bowel sounds. No hepatosplenomegaly.  MUSCULOSKELETAL: No swelling, clubbing, or edema.  NEUROLOGIC: Mild distress due to acute illness SKIN:intact,warm,dry   LABS AND IMAGING     -I personally reviewed most recent blood work, imaging and microbiology - significant findings today are anemia, cKd stage IV, thrombocytopenia  LAB RESULTS: Recent Labs  Lab 05/08/18 0523 05/08/18 1103 05/09/18 1550  NA 131* 128* 133*  K 3.7 3.5 4.0  CL 99 97* 101  CO2 23 21* 25  BUN 37* 37* 34*  CREATININE 4.80* 4.92* 4.21*  GLUCOSE 92 117* 108*   Recent Labs  Lab 05/07/18 0458 05/08/18 0523 05/09/18 0134 05/09/18 1117 05/09/18 1550  HGB 7.5* 7.2* 7.8* 6.1* 6.9*  HCT 23.9* 22.2* 24.0*  --  21.3*  WBC 8.8 6.4 7.8  --   --   PLT 104* 118* 130*  --   --      IMAGING RESULTS: Dg Chest 1 View  Result Date: 05/09/2018 CLINICAL DATA:  Chest pain EXAM: CHEST  1 VIEW COMPARISON:  May 09, 2018 study obtained earlier in the day FINDINGS: There is persistent cardiomegaly with pulmonary venous hypertension. There is a small left pleural effusion. There is patchy interstitial edema. No frank airspace consolidation. No adenopathy. There is aortic  atherosclerosis. There is a stent in the left innominate region. No bone lesions. IMPRESSION: Stable findings felt to represent a degree of congestive heart failure. No frank consolidation. Aortic Atherosclerosis (ICD10-I70.0). Electronically Signed   By: Lowella Grip III M.D.   On: 05/09/2018 09:08   Dg Chest Port 1 View  Result Date: 05/09/2018 CLINICAL DATA:  Worsening cough. EXAM: PORTABLE CHEST 1 VIEW COMPARISON:  05/06/2018 and older exams FINDINGS: Stable cardiomegaly. Lungs show vascular congestion, interstitial thickening and hazy basilar opacity, most evident on the left. These findings are similar to the most recent prior exam consistent with mild congestive heart failure. Suspect small pleural effusions. Stable left subclavian vascular stent. No pneumothorax. IMPRESSION: No significant change from the most recent prior exam. Findings consistent with mild congestive heart failure with cardiomegaly, interstitial edema and small effusions. Electronically Signed   By: Lajean Manes M.D.   On: 05/09/2018 01:49      ASSESSMENT AND PLAN    -Multidisciplinary rounds held today  Acute blood loss anemia -Secondary to hematochezia likely from known duodenal ulcer -GI and vascular surgery on case-appreciate input- plan for embolization -Surgery on case- plan for transfer to tertiary center if above is unsuccessful  CARDIAC FAILURE- -transient hypotension likely due to hypovolemic shock- improved -Background history of CHF with EF 30 to 35% -follow up cardiac enzymes as indicated ICU monitoring  Renal Failure-CKD stage IV on HD -Nephrology on case-appreciate input    NEUROLOGY -Mental status improving with fluid resuscitation and blood transfusion - minimal sedation to achieve a RASS goal: -1 Wake up assessment pending  GI/Nutrition GI PROPHYLAXIS as indicated DIET-->TF's as tolerated Constipation protocol as indicated  ENDO - ICU hypoglycemic\Hyperglycemia protocol  -check FSBS per protocol   ELECTROLYTES -follow labs as needed -replace as needed -pharmacy consultation   DVT/GI PRX ordered -SCDs  TRANSFUSIONS AS NEEDED MONITOR FSBS ASSESS the need for LABS as needed   Critical care provider statement:    Critical care time (minutes):   38   Critical care time was exclusive of:  Separately billable procedures and treating other patients   Critical care was necessary to treat or prevent imminent or life-threatening deterioration of the following conditions:  Acute blood loss anemia, altered mental status with confusion, CKD stage IV on HD, hypovolemic shock   Critical care was time spent personally by me on the following activities:  Development of treatment plan with patient or surrogate, discussions with consultants, evaluation of patient's response to treatment, examination of patient, obtaining history from patient or surrogate, ordering and performing treatments and interventions, ordering and review of laboratory studies and re-evaluation of patient's condition.  I assumed direction of critical care for this patient from another provider in my specialty: no    This document was prepared using Dragon voice recognition software and may include unintentional dictation errors.    Ottie Glazier, M.D.  Division of [place]

## 2018-05-09 NOTE — Plan of Care (Signed)
Palliative:  Palliative consult seen and noted. [name] is off the floor for procedure.  Conference with nursing staff related to patient status. PMT to follow-up 3/18.    No charge Quinn Axe, NP  Palliative Medicine Team  Team Phone # [phone]  Greater than 50% of this time was spent counseling and coordinating care related to the above assessment and plan.

## 2018-05-09 NOTE — Progress Notes (Signed)
Patient called out to nursing station complaining of chest pain at [phone]. I entered the room and he said his pain was 6/10 mid sternal radiating to left chest. No pain or tingling down arms, no pain in neck, no dizziness, no headache, no nausea. I paged doctor on call and Dr Mitchell Heir returned my page and gave orders. Verbal orders given and placed in chart. NTG was given, morphine was given, NS bolus was given. Patient's pain was 4/10 at 0628, 3/10 at 0640, 2/10 at 0645, barely a 1 at 0650, no pain at 0655.

## 2018-05-09 NOTE — Op Note (Signed)
[facility] Percutaneous Study/Intervention Procedural Note     Surgeon(s): Nurse, children's: none  Pre-operative Diagnosis: 1.  Bleeding duodenal ulcer 2.  Hemorrhagic shock  Post-operative diagnosis: Same  Procedure(s) Performed: 1. Ultrasound guidance for vascular access right femoral artery 2. Catheter placement into gastroduodenal artery 3. Aortogram and selective angiogram of the gastroduodenal artery 4. Coil embolization of the gastroduodenal artery with multiple Ruby coils. 5. StarClose closure device right femoral artery  Anesthesia: Moderate conscious sedation for approximately 45 minutes.  Continuous ECG pulse oximetry and cardiopulmonary monitoring was performed throughout the entire procedure by the interventional radiology nurse.  EBL: Less than 10 cc at the femoral site  Fluoro Time: 8.1 minutes  Contrast: 95 cc  Indications: Patient is a 63 y.o.male with brisk upper GI bleeding with resultant hemorrhagic shock. The patient has multiple endoscopies where attempts at controlling the bleeding with clips and cautery were unsuccessful.  I am now consulted urgently for treatment as the patient has become hypotensive and will die if the bleeding persists.  The patient is brought in for angiography for further evaluation and potential treatment. Risks and benefits are discussed and informed consent is obtained  Procedure: The patient was identified and appropriate procedural time out was performed. The patient was then placed supine on the table and prepped and draped in the usual sterile fashion.Moderate conscious sedation was administered during a face to face encounter with the patient throughout the procedure with my supervision of the RN administering medicines and monitoring the patient's vital signs, pulse oximetry, telemetry and mental status  throughout from the start of the procedure until the patient was taken to the recovery room.    Ultrasound was used to evaluate the right common femoral artery. It was patent.  A micropuncture needle was used to access the right common femoral artery under direct ultrasound guidance and a permanent image was performed. A micro-sheath was then placed followed by a 0.035 J wire.  The J-wire was advanced without resistance and a 5Fr sheath was placed. Pigtail catheter was placed into the aorta and an AP aortogram was performed. This demonstrated the origin of the celiac to be at T12 level.  We transitioned to the lateral projection to cannulate the celiac artery. A V S1 catheter was used to selectively cannulate the celiac artery.  Glidewire was then negotiated more distally allowing purchase and the sheath was advanced up and over the V S1 catheter and positioned in the celiac artery proper.  We then transition back to AP where hand-injection of contrast demonstrated the origin of the gastroduodenal artery.  Glidewire and Kumpe catheter were then used to negotiate into the gastroduodenal.  Once the Kumpe had been negotiated past the portion of the artery that feeds the duodenum a prograde catheter was inserted.  With the Kumpe repositioned to the more proximal gastroduodenal artery a series of coils were then introduced occluding the artery over the multiple branches that fed the duodenum.  A total of 5 coils were placed beginning with a 30 cm pod 5 mm then placing 260 cm packing coils and finishing the more proximal portion of the artery with two 5 mm x 12 cm standard coils.  Follow-up imaging from the sheath itself which had been transitioned into the common hepatic demonstrated the hepatic artery and remains widely patent and free of any impingement.  Gastroduodenal artery is now nonfilling from forward flow.  The diagnostic catheter was removed. StarClose closure device was deployed in  usual fashion with  excellent hemostatic result. The patient was taken to the recovery room in stable condition having tolerated the procedure well.     Findings:Gastroduodenal feeding the duodenum with normal anatomy.  Disposition: Patient was taken to the recovery room in stable condition having tolerated the procedure well.  Complications: None  Hortencia Pilar 05/09/2018 7:07 PM   This note was created with Dragon Medical transcription system. Any errors in dictation are purely unintentional.

## 2018-05-09 NOTE — Progress Notes (Signed)
PT Cancellation Note  Patient Details Name: [name] MRN: [id number] DOB: 06/04/1955   Cancelled Treatment:    Reason Eval/Treat Not Completed: Other (comment)(Hb this date down to 6.1, c reports of bloody bowel movements. Will hold PT evaluation until later date/time once patient is medically appropriate. )  2:19 PM, 05/09/18 Etta Grandchild, PT, DPT Physical Therapist - [place] [phone]    Buccola,Allan C 05/09/2018, 2:19 PM

## 2018-05-09 NOTE — Consult Note (Signed)
Cardiology Consultation:   Patient ID: [name]; [id number]; 1955-11-18   Admit date: 05/03/2018 Date of Consult: 05/09/2018  Primary Care Provider: Donnie Coffin, MD Primary Cardiologist: End Primary Electrophysiologist:  Caryl Comes   Patient Profile:   [name] is a 63 y.o. 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 Benjie Karvonen.  History of Present Illness:   [name] cardiac history dates back to 2015 when he underwent stress testing in the setting of renal transplant evaluation, which was low risk at the time though basal inferior defect was noted. An echo in 2016 showed LV dysfunction with an EF of 30-35%. Echo in 04/2016 showed an EF of 20-25%. In 06/2016, he was admitted to [facility] where echo showed persistent LV dysfunction and catheterization was undertaken and revealed an occluded RCA with left-to-right collaterals. He otherwise had nonobstructive disease. In 01/2017, he was noted to have frequent PVCs during hospitalization and was placed on Holter monitor which showed 34,000 PVCs over 24-hour period. He was readmitted in late 02/2017 in the setting of respiratory failure and pulmonary edema and was found to have a mild troponin elevation. He also continued to have frequent PVCs and was placed on amiodarone therapy. Follow up echo in 08/2017 showed an improvement in EF to 35-40%, moderate AI, moderate to severe MR, moderately dilated LA, mildly dilated RA, PASP > 65 mmHg. He has not been felt to be a good candidate for TEE to further evaluate his mitral valve disease secondary to comorbid conditions and noncompliance.  Patient was recently admitted to the  hospital in 03/2018 with GI bleed requiring 2 units of pRBC. EGD showed nonbleeding cratered duodenal ulcer 10 mm in size. He was readmitted on 05/03/2018 with acute hypoxic respiratory failure requiring intubation and vasopressor support secondary to hypovolemic shock in the setting of acute blood loss anemia secondary to recurrent GI bleed requiring pRBC with a low HGB this admission of 7.2 and currently 7.8 s/p 1 unit of pRBC on 05/08/2018. He has undergone repeat EGD on 05/04/2018 that showed duodenal ulcer with visible vessel in the bulb status post cautery and clip placement adjacent to the ulcer. He was also found to be H pylori positive. On the morning of 05/09/2018, he was laying in bed and developed centralized chest pain around 6:30 AM. Pain was rated a 4-6/10 and was without any associated symptoms. Pain improved with SL NTG x 1 and morphine. He has never had pain similar. Currently chest pain free. In this setting, cardiology was asked to evaluate the patient. Repeat EKG this morning shows prior nonspecific changes that are unchanged without acute ischemic changes. IM has ordered troponin to be cycled with initial value pending at this time.   Past Medical History:  Diagnosis Date  . Arthritis   . CAD (coronary artery disease)    a. 09/2013 Myoview [facility]): basal inf defect more pronounced @ rest, likely artifact, EF 48%, low risk study; b. 06/2016 Cath [facility]): LAD 10p, 30-80m, 40-50d, D1 90(small), D2 40, D3 60-70, D4 30, LCX 10p, 3m, 20d, Om1 30, OM3 40, RCA 100p w/ bridging collats, m/d RCA fill via collats, small/mod caliber-->Med Rx; c. 02/2017  MV: inf infarct w/ peri-inf ischemia. EF <30%.   . Chronic combined systolic and diastolic CHF (congestive heart failure) ([place])    a. 10/2013 Echo [facility]): EF 50%, diast dysfxn;  b. 10/2014 Echo: EF 30-35%, diff HK, gr1 DD;  c. 04/2016 Echo: EF 20-25%, sev dil LV, diff HK, gr3 DD; d. 06/2016 Echo: EF 30-35%, Gr2 DD.  . Diabetes ([place])   . Dyspnea   . ESRD (end  stage renal disease) ([place])    a. MWF dialysis @ [facility].  . Hepatitis    Patient is unsure type of Hepatitis he has  . Hypertension   . Hypertensive heart disease with CHF (congestive heart failure) ([place])   . Mixed Ischemic and non-ischemic cardiomyopathy    a. 10/2013 Echo [facility]): EF 50%, diast dysfxn;  b. 10/2014 Echo: EF 30-35%, diff HK, gr1 DD;  c. 04/2016 Echo: EF 20-25%, sev dil LV, diff HK, gr3 DD; d. 06/2016 Echo: EF 30-35%, Gr2 DD.  [name] Moderate Aortic insufficiency    a. 04/2016 Echo: Mod AI.  [name] Myocardial infarction ([place])   . Peripheral vascular disease ([place])   . Pulmonary hypertension ([place])    a. 10/2014 Echo: Mod-Sev PAH.  [name] PVC's (premature ventricular contractions)    a. 02/2017 24 hr holter: freq polymorphic PVCs w/ total of 38K beats in 24 hrs (34% burden).    Past Surgical History:  Procedure Laterality Date  . A/V FISTULAGRAM Left 04/06/2016   Procedure: A/V Fistulagram;  Surgeon: Katha Cabal, MD;  Location: [place] [facility];  Service: Cardiovascular;  Laterality: Left;  . A/V FISTULAGRAM Left 01/19/2017   Procedure: A/V FISTULAGRAM;  Surgeon: Katha Cabal, MD;  Location: [place] [facility];  Service: Cardiovascular;  Laterality: Left;  . A/V SHUNT INTERVENTION N/A 04/06/2016   Procedure: A/V Shunt Intervention;  Surgeon: Katha Cabal, MD;  Location: [place] [facility];  Service: Cardiovascular;  Laterality: N/A;  . A/V SHUNT INTERVENTION N/A 06/29/2016   Procedure: A/V Shunt Intervention;  Surgeon: Katha Cabal, MD;  Location: [place] [facility];  Service: Cardiovascular;  Laterality: N/A;  . AV FISTULA PLACEMENT Left 2014  . AV FISTULA PLACEMENT Left 06/01/2017   Procedure: INSERTION OF ARTERIOVENOUS (AV) GORE-TEX GRAFT ARM ( BRACHIAL AXILLARY );  Surgeon: Katha Cabal, MD;  Location: [facility] ORS;  Service: Vascular;  Laterality: Left;  . DIALYSIS/PERMA CATHETER INSERTION Right    Dr. Delana Meyer  . DIALYSIS/PERMA CATHETER REMOVAL N/A  07/05/2017   Procedure: DIALYSIS/PERMA CATHETER REMOVAL;  Surgeon: Katha Cabal, MD;  Location: [place] [facility];  Service: Cardiovascular;  Laterality: N/A;  . ESOPHAGOGASTRODUODENOSCOPY N/A 05/04/2018   Procedure: ESOPHAGOGASTRODUODENOSCOPY (EGD);  Surgeon: Virgel Manifold, MD;  Location: [facility] ENDOSCOPY;  Service: Gastroenterology;  Laterality: N/A;  . ESOPHAGOGASTRODUODENOSCOPY (EGD) WITH PROPOFOL N/A 04/20/2018   Procedure: ESOPHAGOGASTRODUODENOSCOPY (EGD) WITH PROPOFOL;  Surgeon: Jonathon Bellows, MD;  Location: [facility] ENDOSCOPY;  Service: Gastroenterology;  Laterality: N/A;  . PERIPHERAL VASCULAR CATHETERIZATION N/A 10/03/2014   Procedure: A/V Shuntogram/Fistulagram;  Surgeon: Algernon Huxley, MD;  Location: [place] [facility];  Service: Cardiovascular;  Laterality: N/A;  . PERIPHERAL VASCULAR CATHETERIZATION Left 10/03/2014   Procedure: A/V Shunt Intervention;  Surgeon: Algernon Huxley, MD;  Location: [place] [facility];  Service: Cardiovascular;  Laterality: Left;  . PERIPHERAL VASCULAR CATHETERIZATION Left 05/01/2015   Procedure: A/V Shuntogram/Fistulagram;  Surgeon: Algernon Huxley, MD;  Location: [place] [facility];  Service: Cardiovascular;  Laterality: Left;  . PERIPHERAL  VASCULAR CATHETERIZATION N/A 05/01/2015   Procedure: A/V Shunt Intervention;  Surgeon: Algernon Huxley, MD;  Location: [place] [facility];  Service: Cardiovascular;  Laterality: N/A;  . UPPER EXTREMITY ANGIOGRAPHY Bilateral 06/29/2016   Procedure: Upper Extremity Angiography;  Surgeon: Katha Cabal, MD;  Location: [place] [facility];  Service: Cardiovascular;  Laterality: Bilateral;  . UPPER EXTREMITY INTERVENTION  06/29/2016   Procedure: Upper Extremity Intervention;  Surgeon: Katha Cabal, MD;  Location: [place] [facility];  Service: Cardiovascular;;  . UPPER EXTREMITY VENOGRAPHY Left 03/15/2017   Procedure: UPPER EXTREMITY VENOGRAPHY;  Surgeon: Katha Cabal, MD;  Location: [place] [facility];  Service:  Cardiovascular;  Laterality: Left;     Home Meds: Prior to Admission medications   Medication Sig Start Date End Date Taking? Authorizing Provider  acetaminophen (TYLENOL) 325 MG tablet Take 2 tablets (650 mg total) by mouth every 6 (six) hours as needed for mild pain (or Fever >/= 101). Patient taking differently: Take 500 mg by mouth every 8 (eight) hours as needed for mild pain (or Fever >/= 101).  09/21/16  Yes Gouru, Aruna, MD  albuterol (PROVENTIL HFA;VENTOLIN HFA) 108 (90 Base) MCG/ACT inhaler Inhale 2 puffs into the lungs every 4 (four) hours as needed for wheezing or shortness of breath.    Yes [provider]  amiodarone (PACERONE) 200 MG tablet Take 1 tablet (200 mg total) by mouth daily. 04/06/17  Yes Theora Gianotti, NP  aspirin EC 81 MG tablet Take 1 tablet (81 mg total) by mouth daily. 04/21/18 04/21/19 Yes Gouru, Illene Silver, MD  atorvastatin (LIPITOR) 40 MG tablet Take 1 tablet (40 mg total) by mouth daily. Patient taking differently: Take 40 mg by mouth every evening.  02/19/17  Yes Demetrios Loll, MD  calcium acetate (PHOSLO) 667 MG capsule Take 1,334-2,001 mg by mouth 3 (three) times daily with meals. take 3 capsules (2001mg ) by mouth three times daily before each meal, and 2 capsules (667mg ) by mouth with snacks twice daily 12/11/15  Yes [provider]  cinacalcet (SENSIPAR) 90 MG tablet Take 60 mg by mouth daily.    Yes [provider]  cyclobenzaprine (FLEXERIL) 5 MG tablet Take 5 mg by mouth daily. 03/17/18  Yes [provider]  diphenhydrAMINE (BENADRYL) 25 mg capsule Take 1 capsule (25 mg total) by mouth every 6 (six) hours as needed for itching or allergies. 04/21/18  Yes Gouru, Illene Silver, MD  ferrous sulfate 325 (65 FE) MG EC tablet Take 1 tablet (325 mg total) by mouth 2 (two) times daily. 04/21/18 04/21/19 Yes Gouru, Illene Silver, MD  ipratropium (ATROVENT HFA) 17 MCG/ACT inhaler Inhale 2 puffs into the lungs 4 (four) times daily as needed for  wheezing.    Yes [provider]  lidocaine (LIDODERM) 5 % Place 1 patch onto the skin daily. Remove & Discard patch within 12 hours or as directed by MD 04/21/18  Yes Gouru, Aruna, MD  lidocaine-prilocaine (EMLA) cream Apply 1 application topically as needed (dialysis access).   Yes [provider]  multivitamin (RENA-VIT) TABS tablet Take 1 tablet by mouth at bedtime. 04/21/18  Yes Gouru, Illene Silver, MD  nitroGLYCERIN (NITROSTAT) 0.4 MG SL tablet Place 0.4 mg under the tongue every 5 (five) minutes as needed for chest pain.   Yes [provider]  Nutritional Supplements (FEEDING SUPPLEMENT, NEPRO CARB STEADY,) LIQD Take 237 mLs by mouth 2 (two) times daily between meals. 04/21/18  Yes Gouru, Illene Silver, MD  omeprazole (PRILOSEC) 40 MG capsule  Take 1 capsule (40 mg total) by mouth 2 (two) times daily. 04/21/18  Yes Gouru, Illene Silver, MD  traMADol (ULTRAM) 50 MG tablet Take 1 tablet (50 mg total) by mouth every 6 (six) hours as needed for moderate pain or severe pain. 04/21/18  Yes Gouru, Illene Silver, MD    Inpatient Medications: Scheduled Meds: . sodium chloride   Intravenous Once  . bismuth subsalicylate  15 mL Oral QID  . chlorhexidine  15 mL Mouth Rinse BID  . Chlorhexidine Gluconate Cloth  6 each Topical Q0600  . epoetin (EPOGEN/PROCRIT) injection  4,000 Units Intravenous Q M,W,F-HD  . feeding supplement (NEPRO CARB STEADY)  237 mL Oral BID BM  . folic acid  1 mg Oral Daily  . mouth rinse  15 mL Mouth Rinse q12n4p  . metroNIDAZOLE  250 mg Oral QID  . multivitamin  1 tablet Oral QHS  . pantoprazole  40 mg Oral BID AC  . tetracycline  500 mg Oral Daily  . vitamin B-12  1,000 mcg Oral Daily  . vitamin C  500 mg Oral BID   Continuous Infusions:  PRN Meds: acetaminophen **OR** acetaminophen, benzonatate, haloperidol lactate, ipratropium-albuterol, LORazepam, morphine injection, nitroGLYCERIN, [DISCONTINUED] ondansetron **OR** ondansetron (ZOFRAN) IV, polyethylene glycol  Allergies:    Allergies  Allergen Reactions  . Sulfa Antibiotics Rash and Shortness Of Breath    Social History:   Social History   Socioeconomic History  . Marital status: Widowed    Spouse name: Not on file  . Number of children: Not on file  . Years of education: Not on file  . Highest education level: Not on file  Occupational History  . Not on file  Social Needs  . Financial resource strain: Not on file  . Food insecurity:    Worry: Not on file    Inability: Not on file  . Transportation needs:    Medical: Not on file    Non-medical: Not on file  Tobacco Use  . Smoking status: Former Smoker    Packs/day: 0.25    Years: 40.00    Pack years: 10.00    Types: Cigarettes    Last attempt to quit: 10/12/2016    Years since quitting: 1.5  . Smokeless tobacco: Never Used  Substance and Sexual Activity  . Alcohol use: No    Alcohol/week: 0.0 standard drinks  . Drug use: Yes    Types: Cocaine, Marijuana    Comment: patient states no drugs  . Sexual activity: Not Currently  Lifestyle  . Physical activity:    Days per week: Not on file    Minutes per session: Not on file  . Stress: Not on file  Relationships  . Social connections:    Talks on phone: Not on file    Gets together: Not on file    Attends religious service: Not on file    Active member of club or organization: Not on file    Attends meetings of clubs or organizations: Not on file    Relationship status: Not on file  . Intimate partner violence:    Fear of current or ex partner: Not on file    Emotionally abused: Not on file    Physically abused: Not on file    Forced sexual activity: Not on file  Other Topics Concern  . Not on file  Social History Narrative   Lives at home in [place] by himself.   Independent on ambulation but does not routinely exercise.  Family History:   Family History  Problem Relation Age of Onset  . Hypertension Son   . Diabetes Son   . Hypertension Mother   . Diabetes Mother    . Diabetes Sister     ROS:  Review of Systems  Constitutional: Positive for malaise/fatigue. Negative for chills, diaphoresis, fever and weight loss.  HENT: Negative for congestion.   Eyes: Negative for discharge and redness.  Respiratory: Negative for cough, hemoptysis, sputum production, shortness of breath and wheezing.   Cardiovascular: Positive for chest pain. Negative for palpitations, orthopnea, claudication, leg swelling and PND.  Gastrointestinal: Positive for blood in stool and diarrhea. Negative for abdominal pain, constipation, heartburn, melena, nausea and vomiting.  Genitourinary: Negative for hematuria.  Musculoskeletal: Negative for falls and myalgias.  Skin: Negative for rash.  Neurological: Positive for weakness. Negative for dizziness, tingling, tremors, sensory change, speech change, focal weakness and loss of consciousness.  Endo/Heme/Allergies: Does not bruise/bleed easily.  Psychiatric/Behavioral: Negative for substance abuse. The patient is not nervous/anxious.   All other systems reviewed and are negative.     Physical Exam/Data:   Vitals:   05/09/18 0034 05/09/18 0422 05/09/18 0617 05/09/18 0659  BP: (!) 102/57 110/62 (!) 99/57 (!) 101/56  Pulse: 83 84 83 77  Resp: 20 18  18   Temp: 99.2 F (37.3 C) 99 F (37.2 C)    TempSrc: Oral Oral    SpO2: 100% 100% 100% 98%  Weight:      Height:        Intake/Output Summary (Last 24 hours) at 05/09/2018 0912 Last data filed at 05/09/2018 0543 Gross per 24 hour  Intake 3311.16 ml  Output 500 ml  Net 2811.16 ml   Filed Weights   05/07/18 0500 05/08/18 1042 05/08/18 1418  Weight: 76.5 kg 80 kg 78.3 kg   Body mass index is 23.41 kg/m.   Physical Exam: General: Frail appearing, in no acute distress. Head: Normocephalic, atraumatic, sclera non-icteric, no xanthomas, nares without discharge.  Neck: Negative for carotid bruits. JVD not elevated. Lungs: Clear bilaterally to auscultation without wheezes,  rales, or rhonchi. Breathing is unlabored. Heart: RRR with S1 S2. II/VI systolic murmurs at the apex, no rubs, or gallops appreciated. Abdomen: Soft, non-tender, non-distended with normoactive bowel sounds. No hepatomegaly. No rebound/guarding. No obvious abdominal masses. Msk:  Strength and tone appear normal for age. Extremities: No clubbing or cyanosis. No edema. Distal pedal pulses are 2+ and equal bilaterally. Neuro: Alert and oriented X 3. No facial asymmetry. No focal deficit. Moves all extremities spontaneously. Psych:  Responds to questions appropriately with a normal affect.   EKG:  The EKG was personally reviewed and demonstrates: 05/08/2018 - NSR, 83 bpm, prior inferior infarct, inferolateral TWI. 05/09/2018 - NSR, 80 bpm, rare PVC, prior inferior infarct, inferolateral TWI (unchanged) Telemetry:  Telemetry was personally reviewed and demonstrates: NSR, PVCs, 6 beats of NSVT this morning  Weights: Filed Weights   05/07/18 0500 05/08/18 1042 05/08/18 1418  Weight: 76.5 kg 80 kg 78.3 kg    Relevant CV Studies: 2D echo 08/2017:  - Left ventricle: The cavity size was at the upper limits of   normal. Wall thickness was increased in a pattern of severe LVH.   Systolic function was moderately reduced. The estimated ejection   fraction was in the range of 35% to 40%. Regional wall motion   abnormalities cannot be excluded. The study is not technically   sufficient to allow evaluation of LV diastolic function. - Aortic valve: Trileaflet;  mildly thickened, mildly calcified   leaflets. There was moderate regurgitation. - Ascending aorta: The ascending aorta was mildly dilated. - Aortic arch: The aortic arch was mildly dilated. - Mitral valve: Moderately thickened leaflets . Thickening, with   involvement of the papillary muscle. Transvalvular velocity was   within the normal range. Transmitral gradient is moderately   elevated and may be accentuated by increased transmitral flow due    to regurgitation. There was moderate to severe regurgitation   directed posteriorly. Degree of regurgitation may be   underestimated due to eccentricity of the jet. Recommend TEE for   further evaluation. - Left atrium: The atrium was moderately dilated. - Right ventricle: The cavity size was mildly dilated. Systolic   function was mildly reduced. - Right atrium: The atrium was mildly dilated. - Tricuspid valve: There was moderate regurgitation. - Pulmonic valve: There was moderate regurgitation. - Pulmonary arteries: Systolic pressure was moderately to severely   increased, >= 65 mm Hg. - Inferior vena cava: The vessel was dilated. The respirophasic   diameter changes were blunted (< 50%), consistent with elevated   central venous pressure. ____________________  LHC 06/2016: CONCLUSIONS: - Subtotaled proximal RCA with left to right collaterals - Diagonal branches with diffuse disease - Left anterior descending and circumflex systems with mild to moderate  diffuse disease up to 30-40% - Elevated left ventricular end-diastolic pressure of 30 post A wave ____________________  Laboratory Data:  Chemistry Recent Labs  Lab 05/07/18 0458 05/08/18 0523 05/08/18 1103  NA 136 131* 128*  K 3.5 3.7 3.5  CL 104 99 97*  CO2 26 23 21*  GLUCOSE 95 92 117*  BUN 28* 37* 37*  CREATININE 3.32* 4.80* 4.92*  CALCIUM 7.4* 7.2* 7.1*  GFRNONAA 19* 12* 12*  GFRAA 22* 14* 13*  ANIONGAP 6 9 10     Recent Labs  Lab 05/03/18 1747  05/06/18 0048 05/06/18 0522 05/08/18 1103  PROT 4.4*  --   --   --   --   ALBUMIN 1.9*   < > 2.2* 2.2* 2.3*  AST 20  --   --   --   --   ALT 14  --   --   --   --   ALKPHOS 61  --   --   --   --   BILITOT 0.6  --   --   --   --    < > = values in this interval not displayed.   Hematology Recent Labs  Lab 05/07/18 0458 05/08/18 0523 05/09/18 0134  WBC 8.8 6.4 7.8  RBC 2.52* 2.36* 2.60*  HGB 7.5* 7.2* 7.8*  HCT 23.9* 22.2* 24.0*  MCV 94.8 94.1 92.3   MCH 29.8 30.5 30.0  MCHC 31.4 32.4 32.5  RDW 18.3* 18.0* 17.9*  PLT 104* 118* 130*   Cardiac EnzymesNo results for input(s): TROPONINI in the last 168 hours. No results for input(s): TROPIPOC in the last 168 hours.  BNPNo results for input(s): BNP, PROBNP in the last 168 hours.  DDimer No results for input(s): DDIMER in the last 168 hours.  Radiology/Studies:  Dg Chest 1 View  Result Date: 05/09/2018 IMPRESSION: Stable findings felt to represent a degree of congestive heart failure. No frank consolidation. Aortic Atherosclerosis (ICD10-I70.0). Electronically Signed   By: Lowella Grip III M.D.   On: 05/09/2018 09:08   Dg Chest Port 1 View  Result Date: 05/09/2018 IMPRESSION: No significant change from the most recent prior exam. Findings consistent with mild  congestive heart failure with cardiomegaly, interstitial edema and small effusions. Electronically Signed   By: Lajean Manes M.D.   On: 05/09/2018 01:49   Dg Chest Port 1 View  Result Date: 05/06/2018 IMPRESSION: Cardiomegaly with suspected mild interstitial edema and small bilateral pleural effusions, increased. Electronically Signed   By: Julian Hy M.D.   On: 05/06/2018 05:16    Assessment and Plan:   1. Chest pain: -Currently symptom free -EKG without new ischemic changes -Troponin pending -Possibly supply demand ischemia in the setting of GI bleed with acute blood loss anemia on anemia of chronic disease -Not a candidate for invasive ischemic evaluation at this time -Monitor  2. CAD: -As above -ASA on hold as below -Resume ASA and Lipitor as able -Aggressive secondary prevention   3. 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 Frequent PVCs/NSVT: -Asymptomatic  -It appears his amiodarone has not been continued upon admission in the setting of severe, acute illness -Resume amiodarone at PTA dosing  5. GI bleed/acute blood loss anemia on anemia of chronic disease: -Status post EGD, cauterization and clipping as above -PPI -Treatment for H pylori -With anginal symptoms, he may need further pRBC, this will be deferred to IM  6. ESRD on HD: -Per nephrology   7. PAD: -Status post left subclavian/innominate stenting in 02/2017 -Followed by vascular surgery  -ASA on hold given GI bleed and acute blood loss anemia, resume per IM/GI  8. HLD: -Continue PTA Lipitor   For questions or updates, please contact [facility] Please consult [email or web address] for contact info under Cardiology/STEMI.   Signed, Christell Faith, PA-C [facility] Pager: [phone] 05/09/2018, 9:12 AM

## 2018-05-09 NOTE — Progress Notes (Signed)
[name]    MR#:  [id number]  DATE OF BIRTH:  February 16, 1956  SUBJECTIVE:  Seen earlier this morning approximately 9:30 AM. At approximately 6:30 AM patient developed sudden onset of chest pain.  He reports it lasted 20 to 30 minutes.  Relieved with nitroglycerin.  It did not radiate to his arm.  Today on rounds nurse reports the patient had 2 bloody bowel movements.  REVIEW OF SYSTEMS:    Review of Systems  Constitutional: Negative for fever, chills weight loss ++weakness HENT: Negative for ear pain, nosebleeds, congestion, facial swelling, rhinorrhea, neck pain, neck stiffness and ear discharge.   Respiratory: Negative for cough, shortness of breath, wheezing  Cardiovascular: ++ chest pain,no palpitations and leg swelling.  Gastrointestinal: Negative for heartburn, abdominal pain, vomiting, diarrhea or consitpation + bloody stools Genitourinary: Negative for dysuria, urgency, frequency, hematuria Musculoskeletal: Negative for back pain or joint pain Neurological: Negative for dizziness, seizures, syncope, focal weakness,  numbness and headaches.  Hematological: Does not bruise/bleed easily.  Psychiatric/Behavioral: Negative for hallucinations, confusion, dysphoric mood    Tolerating Diet: yes      DRUG ALLERGIES:   Allergies  Allergen Reactions  . Sulfa Antibiotics Rash and Shortness Of Breath    VITALS:  Blood pressure (!) 88/48, pulse 84, temperature 99 F (37.2 C), temperature source Oral, resp. rate 18, height 6' (1.829 m), weight 78.3 kg, SpO2 100 %.  PHYSICAL EXAMINATION:  Constitutional: Appears well-developed and well-nourished. No distress. HENT: Normocephalic. [name] Oropharynx is clear and moist.  Eyes: Conjunctivae and EOM are normal. PERRLA, no scleral icterus.  Neck: Normal ROM. Neck supple. No JVD. No tracheal deviation. CVS: RRR, S1/S2 +, no murmurs, no gallops, no carotid bruit.   Pulmonary: Effort and breath sounds normal, no stridor, rhonchi, wheezes, rales.  Abdominal: Soft. BS +,  no distension, tenderness, rebound or guarding.  Musculoskeletal: Normal range of motion. No edema and no tenderness.  Neuro: Alert. CN 2-12 grossly intact. No focal deficits. Skin: Skin is warm and dry. No rash noted. Psychiatric: Normal mood and affect.      LABORATORY PANEL:   CBC Recent Labs  Lab 05/09/18 0134 05/09/18 1117  WBC 7.8  --   HGB 7.8* 6.1*  HCT 24.0*  --   PLT 130*  --    ------------------------------------------------------------------------------------------------------------------  Chemistries  Recent Labs  Lab 05/03/18 1747  05/07/18 0458  05/08/18 1103  NA 142   < > 136   < > 128*  K 4.7   < > 3.5   < > 3.5  CL 113*   < > 104   < > 97*  CO2 23   < > 26   < > 21*  GLUCOSE 98   < > 95   < > 117*  BUN 38*   < > 28*   < > 37*  CREATININE 3.49*   < > 3.32*   < > 4.92*  CALCIUM 6.8*   < > 7.4*   < > 7.1*  MG  --    < > 2.0  --   --   AST 20  --   --   --   --   ALT 14  --   --   --   --   ALKPHOS 61  --   --   --   --   BILITOT 0.6  --   --   --   --    < > =  values in this interval not displayed.   ------------------------------------------------------------------------------------------------------------------  Cardiac Enzymes No results for input(s): TROPONINI in the last 168 hours. ------------------------------------------------------------------------------------------------------------------  RADIOLOGY:  Dg Chest 1 View  Result Date: 05/09/2018 CLINICAL DATA:  Chest pain EXAM: CHEST  1 VIEW COMPARISON:  May 09, 2018 study obtained earlier in the day FINDINGS: There is persistent cardiomegaly with pulmonary venous hypertension. There is a small left pleural effusion. There is patchy interstitial edema. No frank airspace consolidation. No adenopathy. There is aortic atherosclerosis. There is a stent in the left innominate region. No  bone lesions. IMPRESSION: Stable findings felt to represent a degree of congestive heart failure. No frank consolidation. Aortic Atherosclerosis (ICD10-I70.0). Electronically Signed   By: Lowella Grip III M.D.   On: 05/09/2018 09:08   Dg Chest Port 1 View  Result Date: 05/09/2018 CLINICAL DATA:  Worsening cough. EXAM: PORTABLE CHEST 1 VIEW COMPARISON:  05/06/2018 and older exams FINDINGS: Stable cardiomegaly. Lungs show vascular congestion, interstitial thickening and hazy basilar opacity, most evident on the left. These findings are similar to the most recent prior exam consistent with mild congestive heart failure. Suspect small pleural effusions. Stable left subclavian vascular stent. No pneumothorax. IMPRESSION: No significant change from the most recent prior exam. Findings consistent with mild congestive heart failure with cardiomegaly, interstitial edema and small effusions. Electronically Signed   By: Lajean Manes M.D.   On: 05/09/2018 01:49     ASSESSMENT AND PLAN:   63 year old male with end-stage renal disease on hemodialysis, COPD, EtOH abuse and recent discharge from the hospital with GI bleed who presented with massive GI bleed.  1.  Acute upper GI bleed: Patient underwent EGD EGD on 05/04/2018, duodenal ulcer with visible vessel in the bulb status post cautery and clip placed adjacent to the ulcer Patient with episodes of 2 bloody bowel movements. Case discussed with GI who will see patient in consultation.  2.  Acute on chronic blood loss anemia: Patient received multiple blood transfusions.   Hemoglobin six-point 2:01 bloody bowel movements.  Patient will receive 2 units PRBC today.   Patient on replacement for B12 and folate acid.     3.  End-stage renal disease with severe hyperkalemia: CRRT discontinued as per nephrology   hemodialysis Monday, Wednesday and Friday.  4.  Hyperkalemia: This has normalized  5.  Acute hypoxic respiratory failure with inability to  protect airway: Patient intubated on admission and now extubated. Patient doing well from a respiratory standpoint  6.  EtOH abuse: Patient counseled against drinking EtOH.  7. Chest pain: Patient seen by Cardiology this am. Patient not a candidate for any intervention or heparin due to GI bleed. Continue telemetry and monitoring of troponins  8.  Hyponatremia: Will reevaluate in a.m.  Palliative care consult GOC/code  PT consult for discharge planning  Outpatient palliative care service may be of benefit for this patient.    Management plans discussed with the patient and he is in agreement.  CODE STATUS: full  TOTAL TIME TAKING CARE OF THIS PATIENT: 26 minutes.     POSSIBLE D/C tomorrow, DEPENDING ON CLINICAL CONDITION.   Bettey Costa M.D on 05/09/2018 at 11:40 AM  Between 7am to 6pm - Pager - [phone] After 6pm go to [email or web address] - password [email or web address] [facility]  Office  [phone]  CC: Primary care physician; Donnie Coffin, MD  Note: This dictation was prepared with Dragon dictation along with smaller phrase technology. Any transcriptional errors that result from this process  are unintentional.

## 2018-05-09 NOTE — Progress Notes (Signed)
PT Cancellation Note  Patient Details Name: [name] MRN: [id number] DOB: [birth year]   Cancelled Treatment:    Reason Eval/Treat Not Completed: Other (comment)(Elevated troponin 1DA with episodic substernal CP, Cardiology consult pending. Will hold PT evaluation at this time. )  10:38 AM, 05/09/18 Etta Grandchild, PT, DPT Physical Therapist - [facility]  [phone] ([place])    Buccola,Allan C 05/09/2018, 10:37 AM

## 2018-05-09 NOTE — Progress Notes (Signed)
Have been called to see this patient for an acute GI bleed with passage of blood per rectum.  The patient has had 2 upper endoscopies recently with his first 1 in February of this year on the 27 that showed a duodenal ulcer.  On March 12th, four days ago, the patient had another endoscopy with burning and clipping of a duodenal ulcer.  The patient now states that he feels weak and tired and had a another episode of GI bleeding.  The patient has not been amenable to endoscopic therapy and has had recurrent GI bleeding.  I have contacted general surgery and vascular surgery for urgent consults to see if they can stop his bleeding either through a laparotomy versus vascular intervention.  Would not recommend repeat endoscopy on this patient at this time.

## 2018-05-09 NOTE — Evaluation (Signed)
Clinical/Bedside Swallow Evaluation Patient Details  Name: [name] MRN: [id number] Date of Birth: 1955/09/16  Today's Date: 05/09/2018 Time: SLP Start Time (ACUTE ONLY): 0945 SLP Stop Time (ACUTE ONLY): 1033 SLP Time Calculation (min) (ACUTE ONLY): 48 min  Past Medical History:  Past Medical History:  Diagnosis Date  . Arthritis   . CAD (coronary artery disease)    a. 09/2013 Myoview [facility]): basal inf defect more pronounced @ rest, likely artifact, EF 48%, low risk study; b. 06/2016 Cath [facility]): LAD 10p, 30-34m, 40-50d, D1 90(small), D2 40, D3 60-70, D4 30, LCX 10p, 32m, 20d, Om1 30, OM3 40, RCA 100p w/ bridging collats, m/d RCA fill via collats, small/mod caliber-->Med Rx; c. 02/2017 MV: inf infarct w/ peri-inf ischemia. EF <30%.   . Chronic combined systolic and diastolic CHF (congestive heart failure) ([place])    a. 10/2013 Echo [facility]): EF 50%, diast dysfxn;  b. 10/2014 Echo: EF 30-35%, diff HK, gr1 DD;  c. 04/2016 Echo: EF 20-25%, sev dil LV, diff HK, gr3 DD; d. 06/2016 Echo: EF 30-35%, Gr2 DD.  . Diabetes ([place])   . Dyspnea   . ESRD (end stage renal disease) ([place])    a. MWF dialysis @ [facility].  . Hepatitis    Patient is unsure type of Hepatitis he has  . Hypertension   . Hypertensive heart disease with CHF (congestive heart failure) ([place])   . Mixed Ischemic and non-ischemic cardiomyopathy    a. 10/2013 Echo [facility]): EF 50%, diast dysfxn;  b. 10/2014 Echo: EF 30-35%, diff HK, gr1 DD;  c. 04/2016 Echo: EF 20-25%, sev dil LV, diff HK, gr3 DD; d. 06/2016 Echo: EF 30-35%, Gr2 DD.  [name] Moderate Aortic insufficiency    a. 04/2016 Echo: Mod AI.  [name] Myocardial infarction ([place])   . Peripheral vascular disease ([place])   . Pulmonary hypertension ([place])    a. 10/2014 Echo: Mod-Sev PAH.  [name] PVC's (premature ventricular contractions)    a. 02/2017 24 hr holter: freq polymorphic PVCs w/ total of 38K beats in 24 hrs (34% burden).   Past Surgical History:  Past Surgical History:  Procedure Laterality Date   . A/V FISTULAGRAM Left 04/06/2016   Procedure: A/V Fistulagram;  Surgeon: Katha Cabal, MD;  Location: [place] CV LAB;  Service: Cardiovascular;  Laterality: Left;  . A/V FISTULAGRAM Left 01/19/2017   Procedure: A/V FISTULAGRAM;  Surgeon: Katha Cabal, MD;  Location: [place] CV LAB;  Service: Cardiovascular;  Laterality: Left;  . A/V SHUNT INTERVENTION N/A 04/06/2016   Procedure: A/V Shunt Intervention;  Surgeon: Katha Cabal, MD;  Location: [place] CV LAB;  Service: Cardiovascular;  Laterality: N/A;  . A/V SHUNT INTERVENTION N/A 06/29/2016   Procedure: A/V Shunt Intervention;  Surgeon: Katha Cabal, MD;  Location: [place] CV LAB;  Service: Cardiovascular;  Laterality: N/A;  . AV FISTULA PLACEMENT Left 2014  . AV FISTULA PLACEMENT Left 06/01/2017   Procedure: INSERTION OF ARTERIOVENOUS (AV) GORE-TEX GRAFT ARM ( BRACHIAL AXILLARY );  Surgeon: Katha Cabal, MD;  Location: [facility] ORS;  Service: Vascular;  Laterality: Left;  . DIALYSIS/PERMA CATHETER INSERTION Right    Dr. Delana Meyer  . DIALYSIS/PERMA CATHETER REMOVAL N/A 07/05/2017   Procedure: DIALYSIS/PERMA CATHETER REMOVAL;  Surgeon: Katha Cabal, MD;  Location: [place] CV LAB;  Service: Cardiovascular;  Laterality: N/A;  . ESOPHAGOGASTRODUODENOSCOPY N/A 05/04/2018   Procedure: ESOPHAGOGASTRODUODENOSCOPY (EGD);  Surgeon: Virgel Manifold, MD;  Location: [facility] ENDOSCOPY;  Service: Gastroenterology;  Laterality: N/A;  .  ESOPHAGOGASTRODUODENOSCOPY (EGD) WITH PROPOFOL N/A 04/20/2018   Procedure: ESOPHAGOGASTRODUODENOSCOPY (EGD) WITH PROPOFOL;  Surgeon: Jonathon Bellows, MD;  Location: [facility] ENDOSCOPY;  Service: Gastroenterology;  Laterality: N/A;  . PERIPHERAL VASCULAR CATHETERIZATION N/A 10/03/2014   Procedure: A/V Shuntogram/Fistulagram;  Surgeon: Algernon Huxley, MD;  Location: [place] CV LAB;  Service: Cardiovascular;  Laterality: N/A;  . PERIPHERAL VASCULAR CATHETERIZATION Left 10/03/2014    Procedure: A/V Shunt Intervention;  Surgeon: Algernon Huxley, MD;  Location: [place] CV LAB;  Service: Cardiovascular;  Laterality: Left;  . PERIPHERAL VASCULAR CATHETERIZATION Left 05/01/2015   Procedure: A/V Shuntogram/Fistulagram;  Surgeon: Algernon Huxley, MD;  Location: [place] CV LAB;  Service: Cardiovascular;  Laterality: Left;  . PERIPHERAL VASCULAR CATHETERIZATION N/A 05/01/2015   Procedure: A/V Shunt Intervention;  Surgeon: Algernon Huxley, MD;  Location: [place] CV LAB;  Service: Cardiovascular;  Laterality: N/A;  . UPPER EXTREMITY ANGIOGRAPHY Bilateral 06/29/2016   Procedure: Upper Extremity Angiography;  Surgeon: Katha Cabal, MD;  Location: [place] CV LAB;  Service: Cardiovascular;  Laterality: Bilateral;  . UPPER EXTREMITY INTERVENTION  06/29/2016   Procedure: Upper Extremity Intervention;  Surgeon: Katha Cabal, MD;  Location: [place] CV LAB;  Service: Cardiovascular;;  . UPPER EXTREMITY VENOGRAPHY Left 03/15/2017   Procedure: UPPER EXTREMITY VENOGRAPHY;  Surgeon: Katha Cabal, MD;  Location: [place] CV LAB;  Service: Cardiovascular;  Laterality: Left;   HPI:  Pt is a 63 y.o. male with a past medical history of CAD, CHF, diabetes, ESRD on hemodialysis Monday/Wednesday/Friday, hypertension, prior MI, pulmonary hypertension, presents to the emergency department with acute GI bleed.  According to EMS patient received his full dialysis session shortly after returning home began having dark bowel movements that then became bright red blood.  Patient called EMS.  EMS states upon arrival patient is hypotensive, 75 systolic lightheaded and dizzy.  I started IV fluids brought the patient to the emergency department.  Upon arrival patient had another very large bowel movement greater than 500 cc of bright red blood, very lightheaded and dizzy feeling with a blood pressure of 65 systolic.  He was seen in the ED ~2 weeks ago for same complaints.  He was orally  intubated and received blood transfusion.  Pt was extubated on 05/05/2018; currently on a regular diet per MD order.    Assessment / Plan / Recommendation Clinical Impression  Pt appears to present w/ grossly adequate oropharyngeal phase swallowing function w/ reduced risk for aspiration when following general aspiration precautions as discussed including sitting upright for all oral intake. Pt helped to sit up in the bed for po trials. He consumed several trials of thin liquids then few trials of puree/mech soft consistency w/ no overt s/s of aspiration noted; no decline in vocal quality or respiratory status noted. Oral phase was appropriate for bolus management, timely A-P transfer and oral clearing. Pt required min extra oral phase time for bolus mastication and gumming d/t edentulous at baseline. Pt fed self. OM exam appeared grossly [facility] w/ no unilateral weakness noted. Recommend a Mech soft diet consistency for pt(meats cut, moistened) for easier mastication d/t edentulous at baseline; Thin liquids. General aspiration precautions and pills in puree if needed for easier swallowing. Pt appears at his baseline at this time; NSG to reconsult if any decline in status while admitted. NSG updated, agreed.  SLP Visit Diagnosis: Dysphagia, oral phase (R13.11)(lacking dentition)    Aspiration Risk  (reduced following aspiration precautions)    Diet Recommendation  Mech Soft diet w/ Thin liquids; general aspiration precautions including sitting fully upright for po intake, and rest breaks as needed  Medication Administration: Whole meds with liquid(in puree IF needed for easier swallowing)    Other  Recommendations Recommended Consults: (GI following for a bleed) Oral Care Recommendations: Oral care BID;Staff/trained caregiver to provide oral care Other Recommendations: (n/a)   Follow up Recommendations None      Frequency and Duration (n/a)  (n/a)       Prognosis Prognosis for Safe Diet  Advancement: Good      Swallow Study   General Date of Onset: 05/03/18 HPI: Pt is a 63 y.o. male with a past medical history of CAD, CHF, diabetes, ESRD on hemodialysis Monday/Wednesday/Friday, hypertension, prior MI, pulmonary hypertension, presents to the emergency department with acute GI bleed.  According to EMS patient received his full dialysis session shortly after returning home began having dark bowel movements that then became bright red blood.  Patient called EMS.  EMS states upon arrival patient is hypotensive, 75 systolic lightheaded and dizzy.  I started IV fluids brought the patient to the emergency department.  Upon arrival patient had another very large bowel movement greater than 500 cc of bright red blood, very lightheaded and dizzy feeling with a blood pressure of 65 systolic.  He was seen in the ED ~2 weeks ago for same complaints.  He was orally intubated and received blood transfusion.  Pt was extubated on 05/05/2018; currently on a regular diet per MD order.  Type of Study: Bedside Swallow Evaluation Previous Swallow Assessment: none Diet Prior to this Study: Regular;Thin liquids Temperature Spikes Noted: No(wbc 7.8) Respiratory Status: Nasal cannula(2 liters) History of Recent Intubation: Yes Length of Intubations (days): 2 days Date extubated: 05/05/18 Behavior/Cognition: Alert;Cooperative;Pleasant mood Oral Cavity Assessment: Within Functional Limits Oral Care Completed by SLP: Recent completion by staff Oral Cavity - Dentition: Edentulous Vision: Functional for self-feeding Self-Feeding Abilities: Able to feed self;Needs set up Patient Positioning: Upright in bed(helped to sit up in bed) Baseline Vocal Quality: Normal;Low vocal intensity Volitional Cough: Strong Volitional Swallow: Able to elicit    Oral/Motor/Sensory Function Overall Oral Motor/Sensory Function: Within functional limits   Ice Chips Ice chips: Not tested   Thin Liquid Thin Liquid: Within  functional limits Presentation: Self Fed;Straw(8 trials)    Nectar Thick Nectar Thick Liquid: Not tested   Honey Thick Honey Thick Liquid: Not tested   Puree Puree: Within functional limits Presentation: Self Fed;Spoon(3 trials)   Solid     Solid: Impaired(mech soft consistency) Presentation: Self Fed;Spoon(2 trials) Oral Phase Impairments: Impaired mastication(edentulous at baseline) Oral Phase Functional Implications: Impaired mastication(edentulous at baseline) Pharyngeal Phase Impairments: (none)      Orinda Kenner, MS, CCC-SLP [place] Crass 05/09/2018,3:11 PM

## 2018-05-10 ENCOUNTER — Encounter: Payer: Self-pay | Admitting: Vascular Surgery

## 2018-05-10 ENCOUNTER — Inpatient Hospital Stay: Payer: Medicare Other

## 2018-05-10 LAB — CBC
HCT: 19.2 % — ABNORMAL LOW (ref 39.0–52.0)
HCT: 25 % — ABNORMAL LOW (ref 39.0–52.0)
Hemoglobin: 6.3 g/dL — ABNORMAL LOW (ref 13.0–17.0)
Hemoglobin: 8.6 g/dL — ABNORMAL LOW (ref 13.0–17.0)
MCH: 30.4 pg (ref 26.0–34.0)
MCH: 31 pg (ref 26.0–34.0)
MCHC: 32.8 g/dL (ref 30.0–36.0)
MCHC: 34.4 g/dL (ref 30.0–36.0)
MCV: 92.8 fL (ref 80.0–100.0)
MCV: 90.3 fL (ref 80.0–100.0)
Platelets: 138 10*3/uL — ABNORMAL LOW (ref 150–400)
Platelets: 158 10*3/uL (ref 150–400)
RBC: 2.07 MIL/uL — AB (ref 4.22–5.81)
RBC: 2.77 MIL/uL — ABNORMAL LOW (ref 4.22–5.81)
RDW: 17 % — ABNORMAL HIGH (ref 11.5–15.5)
RDW: 15.9 % — ABNORMAL HIGH (ref 11.5–15.5)
WBC: 13 10*3/uL — ABNORMAL HIGH (ref 4.0–10.5)
WBC: 16.2 10*3/uL — AB (ref 4.0–10.5)
nRBC: 0.2 % (ref 0.0–0.2)
nRBC: 0.1 % (ref 0.0–0.2)

## 2018-05-10 LAB — GLUCOSE, CAPILLARY
Glucose-Capillary: 101 mg/dL — ABNORMAL HIGH (ref 70–99)
Glucose-Capillary: 59 mg/dL — ABNORMAL LOW (ref 70–99)
Glucose-Capillary: 94 mg/dL (ref 70–99)
Glucose-Capillary: 59 mg/dL — ABNORMAL LOW (ref 70–99)
Glucose-Capillary: 77 mg/dL (ref 70–99)

## 2018-05-10 LAB — BASIC METABOLIC PANEL
Anion gap: 10 (ref 5–15)
BUN: 39 mg/dL — ABNORMAL HIGH (ref 8–23)
CO2: 23 mmol/L (ref 22–32)
Calcium: 7.4 mg/dL — ABNORMAL LOW (ref 8.9–10.3)
Chloride: 103 mmol/L (ref 98–111)
Creatinine, Ser: 4.61 mg/dL — ABNORMAL HIGH (ref 0.61–1.24)
GFR calc Af Amer: 14 mL/min — ABNORMAL LOW (ref >60)
GFR, EST NON AFRICAN AMERICAN: 12 mL/min — AB (ref >60)
Glucose, Bld: 93 mg/dL (ref 70–99)
Potassium: 4.6 mmol/L (ref 3.5–5.1)
Sodium: 136 mmol/L (ref 135–145)

## 2018-05-10 LAB — PREPARE RBC (CROSSMATCH)

## 2018-05-10 MED ORDER — NEPRO/CARBSTEADY PO LIQD: 237.0000 mL | Freq: Two times a day (BID) | ORAL | 0 refills | Status: DC

## 2018-05-10 MED ORDER — HALOPERIDOL LACTATE 5 MG/ML IJ SOLN: 1.0000 mg | Freq: Four times a day (QID) | INTRAMUSCULAR | Status: AC | PRN

## 2018-05-10 MED ORDER — BISMUTH SUBSALICYLATE 262 MG/15ML PO SUSP: 15.0000 mL | Freq: Four times a day (QID) | ORAL | 0 refills | Status: DC

## 2018-05-10 MED ORDER — PANTOPRAZOLE SODIUM 40 MG IV SOLR
40.0000 mg | Freq: Once | INTRAVENOUS | Status: AC
  Administered 2018-05-10: 40 mg via INTRAVENOUS
  Filled 2018-05-10: qty 40

## 2018-05-10 MED ORDER — CHLORHEXIDINE GLUCONATE CLOTH 2 % EX PADS: 6.0000 | MEDICATED_PAD | Freq: Every day | CUTANEOUS | Status: AC

## 2018-05-10 MED ORDER — SODIUM CHLORIDE 0.9 % IV SOLN
0.0000 ug/min | INTRAVENOUS | Status: DC
  Administered 2018-05-10: 30 ug/min via INTRAVENOUS
  Filled 2018-05-10: qty 10

## 2018-05-10 MED ORDER — POLYETHYLENE GLYCOL 3350 17 G PO PACK: 17.0000 g | PACK | Freq: Every day | ORAL | 0 refills | Status: DC | PRN

## 2018-05-10 MED ORDER — DEXTROSE 50 % IV SOLN
INTRAVENOUS | Status: AC
  Administered 2018-05-10: 25 mL via INTRAVENOUS
  Filled 2018-05-10: qty 50

## 2018-05-10 MED ORDER — DEXTROSE 50 % IV SOLN
25.0000 mL | Freq: Once | INTRAVENOUS | Status: AC
  Administered 2018-05-10: 25 mL via INTRAVENOUS

## 2018-05-10 MED ORDER — ONDANSETRON HCL 4 MG/2ML IJ SOLN: 4.0000 mg | Freq: Four times a day (QID) | INTRAMUSCULAR | 0 refills | Status: AC | PRN

## 2018-05-10 MED ORDER — CYANOCOBALAMIN 1000 MCG PO TABS: 1000.0000 ug | ORAL_TABLET | Freq: Every day | ORAL | Status: DC

## 2018-05-10 MED ORDER — EPOETIN ALFA 10000 UNIT/ML IJ SOLN: 4000.0000 [IU] | INTRAMUSCULAR | Status: AC

## 2018-05-10 MED ORDER — VANCOMYCIN HCL IN DEXTROSE 750-5 MG/150ML-% IV SOLN
750.0000 mg | INTRAVENOUS | Status: DC
  Filled 2018-05-10: qty 150

## 2018-05-10 MED ORDER — DEXTROSE 5 % IV SOLN: 500.0000 mg | INTRAVENOUS | Status: DC

## 2018-05-10 MED ORDER — IPRATROPIUM-ALBUTEROL 0.5-2.5 (3) MG/3ML IN SOLN: 3.0000 mL | RESPIRATORY_TRACT | Status: AC | PRN

## 2018-05-10 MED ORDER — SODIUM CHLORIDE 0.9 % IV SOLN: 0.0000 ug/min | INTRAVENOUS | Status: AC

## 2018-05-10 MED ORDER — SODIUM CHLORIDE 0.9 % IV SOLN: 8.0000 mg/h | INTRAVENOUS | Status: AC

## 2018-05-10 MED ORDER — SODIUM CHLORIDE 0.9 % IV SOLN
0.0000 ug/min | INTRAVENOUS | Status: DC
  Administered 2018-05-10: 8 ug/min via INTRAVENOUS
  Filled 2018-05-10: qty 40

## 2018-05-10 MED ORDER — BENZONATATE 100 MG PO CAPS: 100.0000 mg | ORAL_CAPSULE | Freq: Three times a day (TID) | ORAL | 0 refills | Status: DC | PRN

## 2018-05-10 MED ORDER — ATORVASTATIN CALCIUM 40 MG PO TABS: 40.0000 mg | ORAL_TABLET | Freq: Every day | ORAL | Status: DC

## 2018-05-10 MED ORDER — PANTOPRAZOLE SODIUM 40 MG IV SOLR: 40.0000 mg | Freq: Two times a day (BID) | INTRAVENOUS | Status: AC

## 2018-05-10 MED ORDER — METRONIDAZOLE IN NACL 5-0.79 MG/ML-% IV SOLN
500.0000 mg | Freq: Three times a day (TID) | INTRAVENOUS | Status: DC
  Administered 2018-05-10: 500 mg via INTRAVENOUS
  Filled 2018-05-10 (×3): qty 100

## 2018-05-10 MED ORDER — SODIUM CHLORIDE 0.9 % IV BOLUS
250.0000 mL | Freq: Once | INTRAVENOUS | Status: AC
  Administered 2018-05-10: 250 mL via INTRAVENOUS

## 2018-05-10 MED ORDER — ORAL CARE MOUTH RINSE: 15.0000 mL | Freq: Two times a day (BID) | OROMUCOSAL | 0 refills | Status: AC

## 2018-05-10 MED ORDER — VANCOMYCIN HCL 10 G IV SOLR
1750.0000 mg | Freq: Once | INTRAVENOUS | Status: AC
  Administered 2018-05-10: 1750 mg via INTRAVENOUS
  Filled 2018-05-10: qty 1750

## 2018-05-10 MED ORDER — SODIUM CHLORIDE 0.9 % IV SOLN
1.0000 g | Freq: Once | INTRAVENOUS | Status: AC
  Administered 2018-05-10: 1 g via INTRAVENOUS
  Filled 2018-05-10: qty 1

## 2018-05-10 MED ORDER — FOLIC ACID 1 MG PO TABS: 1.0000 mg | ORAL_TABLET | Freq: Every day | ORAL | Status: DC

## 2018-05-10 MED ORDER — ASCORBIC ACID 500 MG PO TABS: 500.0000 mg | ORAL_TABLET | Freq: Two times a day (BID) | ORAL | Status: AC

## 2018-05-10 MED ORDER — VANCOMYCIN HCL IN DEXTROSE 750-5 MG/150ML-% IV SOLN: 750.0000 mg | INTRAVENOUS | Status: AC

## 2018-05-10 MED ORDER — METRONIDAZOLE 250 MG PO TABS: 250.0000 mg | ORAL_TABLET | Freq: Four times a day (QID) | ORAL | Status: DC

## 2018-05-10 MED ORDER — SODIUM CHLORIDE 0.9% IV SOLUTION
Freq: Once | INTRAVENOUS | Status: AC
  Administered 2018-05-10: 03:00:00 via INTRAVENOUS

## 2018-05-10 MED ORDER — RENA-VITE PO TABS: 1.0000 | ORAL_TABLET | Freq: Every day | ORAL | 0 refills | Status: DC

## 2018-05-10 MED ORDER — MORPHINE SULFATE (PF) 2 MG/ML IV SOLN: 1.0000 mg | INTRAVENOUS | 0 refills | Status: AC | PRN

## 2018-05-10 MED ORDER — NITROGLYCERIN 0.4 MG SL SUBL: 0.4000 mg | SUBLINGUAL_TABLET | SUBLINGUAL | 12 refills | Status: AC | PRN

## 2018-05-10 MED ORDER — DEXTROSE 5 % IV SOLN: 500.0000 mg | INTRAVENOUS | Status: AC

## 2018-05-10 MED ORDER — SODIUM CHLORIDE 0.9 % IV SOLN: 1.0000 g | Freq: Once | INTRAVENOUS | Status: AC

## 2018-05-10 MED ORDER — ACETAMINOPHEN 650 MG RE SUPP: 650.0000 mg | Freq: Four times a day (QID) | RECTAL | 0 refills | Status: DC | PRN

## 2018-05-10 MED ORDER — SODIUM CHLORIDE 0.9% IV SOLUTION: 3.0000 mL | Freq: Once | INTRAVENOUS | 0 refills | Status: AC

## 2018-05-10 MED ORDER — ACETAMINOPHEN 325 MG PO TABS: 650.0000 mg | ORAL_TABLET | Freq: Four times a day (QID) | ORAL | 0 refills | Status: DC | PRN

## 2018-05-10 MED ORDER — VANCOMYCIN HCL 10 G IV SOLR: 1750.0000 mg | Freq: Once | INTRAVENOUS | Status: AC

## 2018-05-10 MED ORDER — PANTOPRAZOLE SODIUM 40 MG IV SOLR: 40.0000 mg | Freq: Two times a day (BID) | INTRAVENOUS | Status: DC

## 2018-05-10 MED ORDER — SODIUM CHLORIDE 0.9 % IV SOLN
8.0000 mg/h | INTRAVENOUS | Status: DC
  Administered 2018-05-10: 8 mg/h via INTRAVENOUS
  Filled 2018-05-10: qty 80

## 2018-05-10 MED ORDER — LORAZEPAM 2 MG/ML IJ SOLN: 0.5000 mg | INTRAMUSCULAR | 0 refills | Status: AC | PRN

## 2018-05-10 MED ORDER — CHLORHEXIDINE GLUCONATE 0.12 % MT SOLN: 15.0000 mL | Freq: Two times a day (BID) | OROMUCOSAL | 0 refills | Status: AC

## 2018-05-10 NOTE — Progress Notes (Signed)
CRITICAL CARE NOTE        SUBJECTIVE FINDINGS & SIGNIFICANT EVENTS   Patient in NAD.  Pending transport to [facility].  -transport pending.  S/p embolization Resting in bed comfortably, no new complaints.    PAST MEDICAL HISTORY   Past Medical History:  Diagnosis Date  . Arthritis   . CAD (coronary artery disease)    a. 09/2013 Myoview [facility]): basal inf defect more pronounced @ rest, likely artifact, EF 48%, low risk study; b. 06/2016 Cath [facility]): LAD 10p, 30-37m, 40-50d, D1 90(small), D2 40, D3 60-70, D4 30, LCX 10p, 27m, 20d, Om1 30, OM3 40, RCA 100p w/ bridging collats, m/d RCA fill via collats, small/mod caliber-->Med Rx; c. 02/2017 MV: inf infarct w/ peri-inf ischemia. EF <30%.   . Chronic combined systolic and diastolic CHF (congestive heart failure) ([place])    a. 10/2013 Echo [facility]): EF 50%, diast dysfxn;  b. 10/2014 Echo: EF 30-35%, diff HK, gr1 DD;  c. 04/2016 Echo: EF 20-25%, sev dil LV, diff HK, gr3 DD; d. 06/2016 Echo: EF 30-35%, Gr2 DD.  . Diabetes ([place])   . Dyspnea   . ESRD (end stage renal disease) ([place])    a. MWF dialysis @ [facility].  . Hepatitis    Patient is unsure type of Hepatitis he has  . Hypertension   . Hypertensive heart disease with CHF (congestive heart failure) ([place])   . Mixed Ischemic and non-ischemic cardiomyopathy    a. 10/2013 Echo [facility]): EF 50%, diast dysfxn;  b. 10/2014 Echo: EF 30-35%, diff HK, gr1 DD;  c. 04/2016 Echo: EF 20-25%, sev dil LV, diff HK, gr3 DD; d. 06/2016 Echo: EF 30-35%, Gr2 DD.  [name] Moderate Aortic insufficiency    a. 04/2016 Echo: Mod AI.  [name] Myocardial infarction ([place])   . Peripheral vascular disease ([place])   . Pulmonary hypertension ([place])    a. 10/2014 Echo: Mod-Sev PAH.  [name] PVC's (premature ventricular contractions)    a. 02/2017 24 hr holter: freq polymorphic  PVCs w/ total of 38K beats in 24 hrs (34% burden).     SURGICAL HISTORY   Past Surgical History:  Procedure Laterality Date  . A/V FISTULAGRAM Left 04/06/2016   Procedure: A/V Fistulagram;  Surgeon: Katha Cabal, MD;  Location: [place] [facility];  Service: Cardiovascular;  Laterality: Left;  . A/V FISTULAGRAM Left 01/19/2017   Procedure: A/V FISTULAGRAM;  Surgeon: Katha Cabal, MD;  Location: [place] [facility];  Service: Cardiovascular;  Laterality: Left;  . A/V SHUNT INTERVENTION N/A 04/06/2016   Procedure: A/V Shunt Intervention;  Surgeon: Katha Cabal, MD;  Location: [place] [facility];  Service: Cardiovascular;  Laterality: N/A;  . A/V SHUNT INTERVENTION N/A 06/29/2016   Procedure: A/V Shunt Intervention;  Surgeon: Katha Cabal, MD;  Location: [place] [facility];  Service: Cardiovascular;  Laterality: N/A;  . AV FISTULA PLACEMENT Left 2014  . AV FISTULA PLACEMENT Left 06/01/2017   Procedure: INSERTION OF ARTERIOVENOUS (AV) GORE-TEX GRAFT ARM ( BRACHIAL AXILLARY );  Surgeon: Katha Cabal, MD;  Location: [facility] ORS;  Service: Vascular;  Laterality: Left;  . DIALYSIS/PERMA CATHETER INSERTION Right    Dr. Delana Meyer  . DIALYSIS/PERMA CATHETER REMOVAL N/A 07/05/2017   Procedure: DIALYSIS/PERMA CATHETER REMOVAL;  Surgeon: Katha Cabal, MD;  Location: [place] [facility];  Service: Cardiovascular;  Laterality: N/A;  . EMBOLIZATION N/A 05/09/2018   Procedure: EMBOLIZATION (Duodenal Ulceration);  Surgeon: Katha Cabal, MD;  Location: [place]  CV LAB;  Service: Cardiovascular;  Laterality: N/A;  . ESOPHAGOGASTRODUODENOSCOPY N/A 05/04/2018   Procedure: ESOPHAGOGASTRODUODENOSCOPY (EGD);  Surgeon: Virgel Manifold, MD;  Location: [facility] ENDOSCOPY;  Service: Gastroenterology;  Laterality: N/A;  . ESOPHAGOGASTRODUODENOSCOPY (EGD) WITH PROPOFOL N/A 04/20/2018   Procedure: ESOPHAGOGASTRODUODENOSCOPY (EGD) WITH PROPOFOL;  Surgeon: Jonathon Bellows, MD;  Location:  [facility] ENDOSCOPY;  Service: Gastroenterology;  Laterality: N/A;  . PERIPHERAL VASCULAR CATHETERIZATION N/A 10/03/2014   Procedure: A/V Shuntogram/Fistulagram;  Surgeon: Algernon Huxley, MD;  Location: [place] [facility];  Service: Cardiovascular;  Laterality: N/A;  . PERIPHERAL VASCULAR CATHETERIZATION Left 10/03/2014   Procedure: A/V Shunt Intervention;  Surgeon: Algernon Huxley, MD;  Location: [place] [facility];  Service: Cardiovascular;  Laterality: Left;  . PERIPHERAL VASCULAR CATHETERIZATION Left 05/01/2015   Procedure: A/V Shuntogram/Fistulagram;  Surgeon: Algernon Huxley, MD;  Location: [place] [facility];  Service: Cardiovascular;  Laterality: Left;  . PERIPHERAL VASCULAR CATHETERIZATION N/A 05/01/2015   Procedure: A/V Shunt Intervention;  Surgeon: Algernon Huxley, MD;  Location: [place] [facility];  Service: Cardiovascular;  Laterality: N/A;  . UPPER EXTREMITY ANGIOGRAPHY Bilateral 06/29/2016   Procedure: Upper Extremity Angiography;  Surgeon: Katha Cabal, MD;  Location: [place] [facility];  Service: Cardiovascular;  Laterality: Bilateral;  . UPPER EXTREMITY INTERVENTION  06/29/2016   Procedure: Upper Extremity Intervention;  Surgeon: Katha Cabal, MD;  Location: [place] [facility];  Service: Cardiovascular;;  . UPPER EXTREMITY VENOGRAPHY Left 03/15/2017   Procedure: UPPER EXTREMITY VENOGRAPHY;  Surgeon: Katha Cabal, MD;  Location: [place] [facility];  Service: Cardiovascular;  Laterality: Left;     FAMILY HISTORY   Family History  Problem Relation Age of Onset  . Hypertension Son   . Diabetes Son   . Hypertension Mother   . Diabetes Mother   . Diabetes Sister      SOCIAL HISTORY   Social History   Tobacco Use  . Smoking status: Former Smoker    Packs/day: 0.25    Years: 40.00    Pack years: 10.00    Types: Cigarettes    Last attempt to quit: 10/12/2016    Years since quitting: 1.5  . Smokeless tobacco: Never Used  Substance Use Topics  . Alcohol use: No     Alcohol/week: 0.0 standard drinks  . Drug use: Yes    Types: Cocaine, Marijuana    Comment: patient states no drugs     MEDICATIONS   Current Medication:  Current Facility-Administered Medications:  .  0.9 %  sodium chloride infusion (Manually program via Guardrails IV Fluids), , Intravenous, Once, Schnier, Dolores Lory, MD .  acetaminophen (TYLENOL) tablet 650 mg, 650 mg, Oral, Q6H PRN **OR** acetaminophen (TYLENOL) suppository 650 mg, 650 mg, Rectal, Q6H PRN, Schnier, Dolores Lory, MD .  amiodarone (PACERONE) tablet 200 mg, 200 mg, Oral, Daily, Schnier, Dolores Lory, MD .  atorvastatin (LIPITOR) tablet 40 mg, 40 mg, Oral, q1800, Schnier, Dolores Lory, MD .  benzonatate (TESSALON) capsule 100 mg, 100 mg, Oral, TID PRN, Delana Meyer Dolores Lory, MD, 100 mg at 05/09/18 1152 .  bismuth subsalicylate (PEPTO BISMOL) 262 MG/15ML suspension 15 mL, 15 mL, Oral, QID, Schnier, Dolores Lory, MD, 15 mL at 05/09/18 2214 .  [START ON 05/11/2018] ceFEPIme (MAXIPIME) 500 mg in dextrose 5 % 50 mL IVPB, 500 mg, Intravenous, Q24H, Hallaji, Sheema M, RPH .  chlorhexidine (PERIDEX) 0.12 % solution 15 mL, 15 mL, Mouth Rinse, BID, Schnier, Dolores Lory, MD, 15 mL at 05/09/18 2225 .  Chlorhexidine Gluconate Cloth 2 % PADS 6 each, 6 each, Topical, Daily, Lanney Gins, Lucette Kratz, MD .  epoetin alfa (EPOGEN,PROCRIT) injection 4,000 Units, 4,000 Units, Intravenous, Q M,W,F-HD, Schnier, Dolores Lory, MD, 4,000 Units at 05/08/18 1200 .  feeding supplement (NEPRO CARB STEADY) liquid 237 mL, 237 mL, Oral, BID BM, Schnier, Dolores Lory, MD, 237 mL at 05/09/18 0842 .  folic acid (FOLVITE) tablet 1 mg, 1 mg, Oral, Daily, Schnier, Dolores Lory, MD, 1 mg at 05/09/18 0840 .  haloperidol lactate (HALDOL) injection 1 mg, 1 mg, Intravenous, Q6H PRN, Schnier, Dolores Lory, MD, 1 mg at 05/07/18 0012 .  ipratropium-albuterol (DUONEB) 0.5-2.5 (3) MG/3ML nebulizer solution 3 mL, 3 mL, Nebulization, Q4H PRN, Schnier, Dolores Lory, MD .  LORazepam (ATIVAN) injection 0.5 mg, 0.5 mg,  Intravenous, Q4H PRN, Schnier, Dolores Lory, MD .  MEDLINE mouth rinse, 15 mL, Mouth Rinse, q12n4p, Schnier, Dolores Lory, MD, 15 mL at 05/08/18 1551 .  metroNIDAZOLE (FLAGYL) tablet 250 mg, 250 mg, Oral, QID, Schnier, Dolores Lory, MD, 250 mg at 05/09/18 2214 .  morphine 2 MG/ML injection 1 mg, 1 mg, Intravenous, Q2H PRN, Schnier, Dolores Lory, MD, 1 mg at 05/10/18 0915 .  multivitamin (RENA-VIT) tablet 1 tablet, 1 tablet, Oral, QHS, Schnier, Dolores Lory, MD, 1 tablet at 05/09/18 2226 .  nitroGLYCERIN (NITROSTAT) SL tablet 0.4 mg, 0.4 mg, Sublingual, Q5 min PRN, Schnier, Dolores Lory, MD, 0.4 mg at 05/09/18 6948 .  [DISCONTINUED] ondansetron (ZOFRAN) tablet 4 mg, 4 mg, Oral, Q6H PRN **OR** ondansetron (ZOFRAN) injection 4 mg, 4 mg, Intravenous, Q6H PRN, Schnier, Dolores Lory, MD .  ondansetron [facility]) injection 4 mg, 4 mg, Intravenous, Q6H PRN, Schnier, Dolores Lory, MD, 4 mg at 05/09/18 1659 .  pantoprazole (PROTONIX) 80 mg in sodium chloride 0.9 % 250 mL (0.32 mg/mL) infusion, 8 mg/hr, Intravenous, Continuous, Darel Hong D, NP, Last Rate: 25 mL/hr at 05/10/18 0619, 8 mg/hr at 05/10/18 0619 .  [START ON 05/13/2018] pantoprazole (PROTONIX) injection 40 mg, 40 mg, Intravenous, Q12H, Darel Hong D, NP .  phenylephrine (NEO-SYNEPHRINE) 10 mg in sodium chloride 0.9 % 250 mL (0.04 mg/mL) infusion, 0-400 mcg/min, Intravenous, Titrated, Bradly Bienenstock, NP, Stopped at 05/10/18 [phone] .  phenylephrine (NEO-SYNEPHRINE) 40 mg in sodium chloride 0.9 % 250 mL (0.16 mg/mL) infusion, 0-400 mcg/min, Intravenous, Titrated, Darel Hong D, NP, Last Rate: 3 mL/hr at 05/10/18 0831, 8 mcg/min at 05/10/18 0831 .  polyethylene glycol (MIRALAX / GLYCOLAX) packet 17 g, 17 g, Oral, Daily PRN, Schnier, Dolores Lory, MD .  tetracycline (ACHROMYCIN,SUMYCIN) capsule 500 mg, 500 mg, Oral, Daily, Schnier, Dolores Lory, MD, 500 mg at 05/08/18 2017 .  vancomycin (VANCOCIN) IVPB 750 mg/150 ml premix, 750 mg, Intravenous, Q M,W,F-HD, Mody, Sital, MD .   vitamin B-12 (CYANOCOBALAMIN) tablet 1,000 mcg, 1,000 mcg, Oral, Daily, Schnier, Dolores Lory, MD, 1,000 mcg at 05/09/18 0840 .  vitamin C (ASCORBIC ACID) tablet 500 mg, 500 mg, Oral, BID, Schnier, Dolores Lory, MD, 500 mg at 05/09/18 2214    ALLERGIES   Sulfa antibiotics    REVIEW OF SYSTEMS   10 system ROS negative except as per subjective   PHYSICAL EXAMINATION   Vitals:   05/10/18 0600 05/10/18 0624  BP: (!) 94/52 110/73  Pulse: 72 77  Resp: (!) 30 18  Temp: 37.7 C 37.1 C  SpO2: 100% 97%    GENERAL:NAD  HEAD: Normocephalic, atraumatic.  EYES: Pupils equal, round, reactive to light.  No scleral icterus.  MOUTH: Moist mucosal membrane. NECK: Supple.  No thyromegaly. No nodules. No JVD.  PULMONARY: CTAB CARDIOVASCULAR: S1 and S2. Regular rate and rhythm. No murmurs, rubs, or gallops.  GASTROINTESTINAL: Soft, nontender, non-distended. No masses. Positive bowel sounds. No hepatosplenomegaly.  MUSCULOSKELETAL: No swelling, clubbing, or edema.  NEUROLOGIC: Mild distress due to acute illness SKIN:intact,warm,dry   LABS AND IMAGING     -I personally reviewed most recent blood work, imaging and microbiology - significant findings today are anemia.    LAB RESULTS: Recent Labs  Lab 05/08/18 1103 05/09/18 1550 05/10/18 0138  NA 128* 133* 136  K 3.5 4.0 4.6  CL 97* 101 103  CO2 21* 25 23  BUN 37* 34* 39*  CREATININE 4.92* 4.21* 4.61*  GLUCOSE 117* 108* 93   Recent Labs  Lab 05/08/18 0523 05/09/18 0134 05/09/18 1117 05/09/18 1550 05/10/18 0138  HGB 7.2* 7.8* 6.1* 6.9* 6.3*  HCT 22.2* 24.0*  --  21.3* 19.2*  WBC 6.4 7.8  --   --  13.0*  PLT 118* 130*  --   --  138*     IMAGING RESULTS: Dg Chest Port 1 View  Result Date: 05/10/2018 CLINICAL DATA:  Leukocytosis EXAM: PORTABLE CHEST 1 VIEW COMPARISON:  05/09/2018 FINDINGS: Cardiomegaly. Consolidation in the right lower lobe with small right effusion. Findings concerning for pneumonia. Mild vascular congestion.  No acute bony abnormality. IMPRESSION: Cardiomegaly, vascular congestion. Right lower lobe consolidation concerning for pneumonia. Small right effusion. Electronically Signed   By: Rolm Baptise M.D.   On: 05/10/2018 03:15      ASSESSMENT AND PLAN    -Multidisciplinary rounds held today  Acute blood loss anemia -Secondary to hematochezia likely from known duodenal ulcer -GI and vascular surgery on case-appreciate input- plan for embolization -Surgery on case- plan for transfer to tertiary center if above is unsuccessful -additional prbc 2 units overnight - post transfusion h/h pending.   CARDIAC FAILURE- -transient hypotension likely due to hypovolemic shock- improved -Background history of CHF with EF 30 to 35% -follow up cardiac enzymes as indicated ICU monitoring  Renal Failure-CKD stage IV on HD -Nephrology on case-appreciate input- will likely get [facility] after transport     NEUROLOGY -Mental status improving with fluid resuscitation and blood transfusion - minimal sedation to achieve a RASS goal: -1 Wake up assessment pending  GI/Nutrition GI PROPHYLAXIS as indicated DIET-->TF's as tolerated Constipation protocol as indicated  ENDO - ICU hypoglycemic\Hyperglycemia protocol -check FSBS per protocol   ELECTROLYTES -follow labs as needed -replace as needed -pharmacy consultation   DVT/GI PRX ordered -SCDs  TRANSFUSIONS AS NEEDED MONITOR FSBS ASSESS the need for LABS as needed  Critical care provider statement:    Critical care time (minutes):  35   Critical care time was exclusive of:  Separately billable procedures and treating other patients   Critical care was necessary to treat or prevent imminent or life-threatening deterioration of the following conditions:  LGIB, hypovolemic shock, ESRD. Multiple comorbid conditions.    Critical care was time spent personally by me on the following activities:  Development of treatment plan with patient  or surrogate, discussions with consultants, evaluation of patient's response to treatment, examination of patient, obtaining history from patient or surrogate, ordering and performing treatments and interventions, ordering and review of laboratory studies and re-evaluation of patient's condition.  I assumed direction of critical care for this patient from another provider in my specialty: no    This document was prepared using Dragon voice recognition software and may include unintentional dictation errors.  Ottie Glazier, M.D.  Division of [place]

## 2018-05-10 NOTE — Procedures (Signed)
Central Venous Catheter Insertion Procedure Note Demarquez Ciolek [id number] 1955-08-26  Procedure: Insertion of Central Venous Catheter Indications: Assessment of intravascular volume, Drug and/or fluid administration and Frequent blood sampling  Procedure Details Consent: Risks of procedure as well as the alternatives and risks of each were explained to the (patient/caregiver).  Consent for procedure obtained. Time Out: Verified patient identification, verified procedure, site/side was marked, verified correct patient position, special equipment/implants available, medications/allergies/relevent history reviewed, required imaging and test results available.  Performed  Maximum sterile technique was used including antiseptics, cap, gloves, gown, hand hygiene, mask and sheet. Skin prep: Chlorhexidine; local anesthetic administered A antimicrobial bonded/coated triple lumen catheter was placed in the left femoral vein due to patient being a dialysis patient using the Seldinger technique.  Evaluation Blood flow good Complications: No apparent complications Patient did tolerate procedure well. Chest X-ray ordered to verify placement.  CXR: Not needed, placed in left femoral vein.  Procedure was performed using Ultrasound for direct visualization of cannulization of Left Femoral Vein.   Darel Hong, AGACNP-BC [facility] Pager: [phone] Cell: [phone]   Bradly Bienenstock 05/10/2018, 6:13 AM

## 2018-05-10 NOTE — Progress Notes (Signed)
Pt  Had one large  BM dark maroon colored with clots blood sent to lab for H&H Hemoglobin was6.3 Received 2 Units of PRBC. General surgery came and see the Pt.Pt is alert and oriented. Able to answer questions. CL placed.  Resting comfortably in bednow Will continue to observe closely.

## 2018-05-10 NOTE — Discharge Summary (Signed)
Physician Discharge Summary  Patient ID: [name] MRN: [id number] DOB/AGE: 1955-10-20 63 y.o.  Admit date: 05/03/2018 Discharge date: 05/10/2018   Discharge Diagnoses:  GI Bleed Acute Blood Loss Anemia Hypovolemic shock ESRD on Hemodialysis Hospital Acquired Pneumonia                                           Heart failure with reduced ejection fraction                    DISCHARGE PLAN BY DIAGNOSIS     1) Acute GI Bleed secondary to Duodenal Ulcer with positive H. pylori stool antigen -NPO -Protonix infusion -Follow H&H every 6 hours -Transfusions as indicated -Continue Flagyl and Tetracycline -Status post EGD with clipping and cauterization of duodenal ulcer 3/12 -Status post coil embolization of the gastroduodenal artery 3/17 -GI, vascular surgery, general surgery following -Awaiting transfer to [facility] for surgical repair of bleeding duodenal ulcer bulb  2) Acute blood loss anemia -Monitor for S/Sx of bleeding -Trend CBC -SCD's for VTE Prophylaxis (avoid chemical prophylaxis given GI bleed) -Transfuse for Hgb <8  3) Hypovolemic shock in setting of GI bleed +/- septic shock in setting of HAP -Cardiac monitoring -Maintain map greater than 65 -Cautious IV fluids given combined CHF and end-stage renal disease on HD -Transfusions as indicated -Phenylephrine as needed to maintain map goal  4) ESRD on hemodialysis -Monitor I&O's / urinary output -Follow BMP -Ensure adequate renal perfusion -Avoid nephrotoxic agents as able -Replace electrolytes as indicated -Nephrology following, appreciate input -Hemodialysis as per nephrology  5) Hospital-acquired pneumonia -Monitor fever curve -Trend WBCs -Follow sputum culture -Continue vancomycin and cefepime  6) HFrEF (EF 30-35%) -Cardiac monitoring -Volume removal with HD as able given hypotension requiring vasopressors                     DISCHARGE SUMMARY   [name] is a 63 year old male with a past medical  history notable for end-stage renal disease on hemodialysis,chronic combined systolic and diastolic CHF,diabetes,hepatitis,and hypertension who presented to [facility] ED on 3/11/20with complaints of rectal bleeding.He reported that he went to HD earlier in the day,and when he came home started to have rectal bleeding.Upon presentation to the ED,he was also noted to have significant coffee-ground emesis,requiring intubation in the ED for airway protection.He was placed on Protonix drip,and received 2 units of packed red blood cells. He recently underwent EGD approximately 2 weeks ago due to upper GI bleed,which revealed a clean-based duodenal ulcer that was not actively bleeding.This is the suspected source of his current GI bleed.Upon presentation he became hypotensive,requiring initiation of Levophed drip.He underwent CT angiogram of the abdomen which did not reveal any active bleeding.After receiving blood transfusions his potassium was noted to be 6.9 with EKG changes.Therefore he required emergent placement of a temporary dialysis catheter and initiation of CRRT.  On 05/04/18 GI performed EGD which revealed non-bleeding duodenal ulcer (in the duodenal bulb) with visible vessel.  The vessel was subsequently clipped and cauterized.  He was successfully extubated on 05/05/2018, but was noted to have intermittent agitation and delirium.  He was able to transition from CRRT to intermittent Hemodialysis on 05/06/2018.  His mental status improved 05/08/18 to where he was able to eat.  On 05/09/18 pt again with melena and decreasing hemoglobin, and development of chest pain which was evaluated by Cardiology and felt  to be demand ischemia in setting of GI Bleed and anemia.  He was also deemed not to be a candidate for cardiac catheterization.  Given his active bleeding, General Surgery and Vascular Surgery were consulted. Vascular Surgery performed coil embolization of the gastroduodenal artery  with cessation of bleeding.  However early in the morning on 3/18, he again had a large melena episode with associated hypotension and drop in hemoglobin concerning for active bleeding of his ulcer.  General Surgery was notified who recommends surgical intervention, but that intervention needs to be performed at [facility].  He is currently receiving pRBC infusions and requiring low dose Phenylephrine to maintain his BP.  He has developed Leukocytosis with new right lower lobe infiltrate concerning for Pneumonia (HAP). He has been accepted to [facility] and is awaiting transfer.   SIGNIFICANT DIAGNOSTIC STUDIES CT head 05/03/2018>> 1. No acute intracranial abnormality identified. 2. Stable chronic microvascular ischemic changes and volume loss of the brain. CTA abdomen and pelvis 05/04/2018>>  VASCULAR 1. Atherosclerotic calcifications of the abdominal aorta without aneurysm or dissection. Patent atherosclerotic branch vessels as above. 2. No evidence of active hemorrhage. NON-VASCULAR 1. No CT findings to explain the patient's hemoptysis nor hematochezia. 2. Mild soft tissue anasarca. 3. Cardiomegaly with coronary arteriosclerosis. Pulmonary consolidation medially at left base. 4. Atrophic bilateral kidneys. EGD 05/04/2018>> normal esophagus, nonbleeding duodenal ulcer with visible vessel in the duodenal bulb of which was clipped and cauterized   MICRO DATA  MRSA PCR 05/03/2018>> negative Sputum 05/10/2018>>  ANTIBIOTICS Flagyl 3/16>> Tetracycline 3/16>> Cefepime 3/18>> Vancomycin 3/18>>  CONSULTS GI 3/11>> Pulmonary & Critical Care 3/11>>3/14 Nephrology 3/12>> Cardiology 3/17>> General Surgery 3/17>> Vascular Surgery 3/17>>  TUBES / LINES ETT 3/11>>3/14 Left Femoral CVC 3/11>>3/15 Right Femoral Trialysis Catheter 3/12>>3/16 Left Femoral CVC 3/18>>   Discharge Exam: General: Acutely ill-appearing male, laying in bed, on 2 L nasal cannula, in no acute distress Neuro:  Lethargic, arouses to voice, oriented x4, follows commands, no focal deficits, speech clear CV: Regular rate and rhythm, 2/6 murmur, 1+ pulses throughout PULM: Clear diminished throughout upon auscultation, even, nonlabored, normal effort, no accessory muscle use GI: Soft, nontender, nondistended, no guarding or rebound tenderness Extremities: Normal bulk and tone, no deformities, warm and dry  Vitals:   05/10/18 0245 05/10/18 0300 05/10/18 0430 05/10/18 0435  BP: (!) 89/59 (!) 86/60    Pulse:  75    Resp:  12    Temp: 98.8 F (37.1 C)  98.6 F (37 C) 98.6 F (37 C)  TempSrc: Oral  Oral Oral  SpO2:  100%    Weight:      Height:         Discharge Labs  BMET Recent Labs  Lab 05/05/18 1611 05/05/18 2043 05/06/18 0048 05/06/18 0522 05/07/18 0458 05/08/18 0523 05/08/18 1103 05/09/18 1550 05/10/18 0138  NA 135 135 135 135 136 131* 128* 133* 136  K 3.9 3.7 3.7 3.7 3.5 3.7 3.5 4.0 4.6  CL 102 102 102 103 104 99 97* 101 103  CO2 '23 24 25 24 26 23 ' 21* 25 23  GLUCOSE 108* 153* 124* 107* 95 92 117* 108* 93  BUN 30* 26* 24* 21 28* 37* 37* 34* 39*  CREATININE 2.49* 2.16* 2.15* 1.92* 3.32* 4.80* 4.92* 4.21* 4.61*  CALCIUM 7.8* 7.7* 7.8* 7.9* 7.4* 7.2* 7.1* 7.1* 7.4*  MG 1.8 1.9 2.0 1.9 2.0  --   --   --   --   PHOS 4.9* 4.2 4.2 3.7 4.5  --  5.7*  --   --     CBC Recent Labs  Lab 05/08/18 0523 05/09/18 0134 05/09/18 1117 05/09/18 1550 05/10/18 0138  HGB 7.2* 7.8* 6.1* 6.9* 6.3*  HCT 22.2* 24.0*  --  21.3* 19.2*  WBC 6.4 7.8  --   --  13.0*  PLT 118* 130*  --   --  138*    Anti-Coagulation No results for input(s): INR in the last 168 hours.        Allergies as of 05/10/2018      Reactions   Sulfa Antibiotics Rash, Shortness Of Breath      Medication List    STOP taking these medications   albuterol 108 (90 Base) MCG/ACT inhaler Commonly known as:  PROVENTIL HFA;VENTOLIN HFA   amiodarone 200 MG tablet Commonly known as:  PACERONE   aspirin EC 81 MG  tablet   calcium acetate 667 MG capsule Commonly known as:  PHOSLO   cinacalcet 90 MG tablet Commonly known as:  SENSIPAR   cyclobenzaprine 5 MG tablet Commonly known as:  FLEXERIL   diphenhydrAMINE 25 mg capsule Commonly known as:  BENADRYL   ferrous sulfate 325 (65 FE) MG EC tablet   ipratropium 17 MCG/ACT inhaler Commonly known as:  ATROVENT HFA   lidocaine 5 % Commonly known as:  LIDODERM   lidocaine-prilocaine cream Commonly known as:  EMLA   omeprazole 40 MG capsule Commonly known as:  PRILOSEC     TAKE these medications   acetaminophen 325 MG tablet Commonly known as:  TYLENOL Take 2 tablets (650 mg total) by mouth every 6 (six) hours as needed for mild pain (or Fever >/= 101). What changed:    how much to take  when to take this   acetaminophen 650 MG suppository Commonly known as:  TYLENOL Place 1 suppository (650 mg total) rectally every 6 (six) hours as needed for mild pain (or Fever >/= 101). What changed:  You were already taking a medication with the same name, and this prescription was added. Make sure you understand how and when to take each.   ascorbic acid 500 MG tablet Commonly known as:  VITAMIN C Take 1 tablet (500 mg total) by mouth 2 (two) times daily.   atorvastatin 40 MG tablet Commonly known as:  LIPITOR Take 1 tablet (40 mg total) by mouth daily at 6 PM. What changed:  when to take this   benzonatate 100 MG capsule Commonly known as:  TESSALON Take 1 capsule (100 mg total) by mouth 3 (three) times daily as needed for cough.   bismuth subsalicylate 182 XH/37JI suspension Commonly known as:  PEPTO BISMOL Take 15 mLs by mouth 4 (four) times daily.   ceFEPIme 1 g in sodium chloride 0.9 % 100 mL Inject 1 g into the vein once for 1 dose.   ceFEPIme 500 mg in dextrose 5 % 50 mL Inject 500 mg into the vein daily. Start taking on:  May 11, 2018   chlorhexidine 0.12 % solution Commonly known as:  PERIDEX 15 mLs by Mouth Rinse  route 2 (two) times daily.   Chlorhexidine Gluconate Cloth 2 % Pads Apply 6 each topically daily.   cyanocobalamin 1000 MCG tablet Take 1 tablet (1,000 mcg total) by mouth daily.   epoetin alfa 10000 UNIT/ML injection Commonly known as:  EPOGEN,PROCRIT Inject 0.4 mLs (4,000 Units total) into the vein every Monday, Wednesday, and Friday with hemodialysis.   feeding supplement (NEPRO CARB STEADY) Liqd Take 237 mLs by  mouth 2 (two) times daily between meals.   folic acid 1 MG tablet Commonly known as:  FOLVITE Take 1 tablet (1 mg total) by mouth daily.   haloperidol lactate 5 MG/ML injection Commonly known as:  HALDOL Inject 0.2 mLs (1 mg total) into the vein every 6 (six) hours as needed (for agitated delirium).   ipratropium-albuterol 0.5-2.5 (3) MG/3ML Soln Commonly known as:  DUONEB Take 3 mLs by nebulization every 4 (four) hours as needed.   LORazepam 2 MG/ML injection Commonly known as:  ATIVAN Inject 0.25 mLs (0.5 mg total) into the vein every 4 (four) hours as needed for anxiety.   metroNIDAZOLE 250 MG tablet Commonly known as:  FLAGYL Take 1 tablet (250 mg total) by mouth 4 (four) times daily.   morphine 2 MG/ML injection Inject 0.5 mLs (1 mg total) into the vein every 2 (two) hours as needed (chest pain).   mouth rinse Liqd solution 15 mLs by Mouth Rinse route 2 times daily at 12 noon and 4 pm.   multivitamin Tabs tablet Take 1 tablet by mouth at bedtime.   nitroGLYCERIN 0.4 MG SL tablet Commonly known as:  NITROSTAT Place 1 tablet (0.4 mg total) under the tongue every 5 (five) minutes as needed for chest pain.   ondansetron 4 MG/2ML Soln injection Commonly known as:  ZOFRAN Inject 2 mLs (4 mg total) into the vein every 6 (six) hours as needed for nausea.   ondansetron 4 MG/2ML Soln injection Commonly known as:  ZOFRAN Inject 2 mLs (4 mg total) into the vein every 6 (six) hours as needed for nausea, vomiting or refractory nausea / vomiting.    pantoprazole 40 MG injection Commonly known as:  PROTONIX Inject 40 mg into the vein every 12 (twelve) hours. Start taking on:  May 13, 2018   pantoprazole 80 mg in sodium chloride 0.9 % 250 mL Inject 8 mg/hr into the vein continuous.   phenylephrine 10 mg in sodium chloride 0.9 % 250 mL Inject 0-400 mcg/min into the vein continuous.   polyethylene glycol packet Commonly known as:  MIRALAX / GLYCOLAX Take 17 g by mouth daily as needed for mild constipation.   sodium chloride 0.9 % infusion Inject 3 mLs into the vein once for 1 dose.   traMADol 50 MG tablet Commonly known as:  ULTRAM Take 1 tablet (50 mg total) by mouth every 6 (six) hours as needed for moderate pain or severe pain.   vancomycin 1,750 mg in sodium chloride 0.9 % 500 mL Inject 1,750 mg into the vein once for 1 dose.   Vancomycin 750-5 MG/150ML-% Soln Commonly known as:  VANCOCIN Inject 150 mLs (750 mg total) into the vein every Monday, Wednesday, and Friday with hemodialysis.         Disposition: ICU  Discharged Condition: [name] has met maximum benefit of inpatient care and requires surgical repair of bleeding duodenal bulb ulcer that is unable to be performed at [facility], therefore transfer to [facility] pending for surgical repair.      Time spent on disposition:  45 Minutes.   Signed: Darel Hong, AGACNP-BC [place] [facility] Pager: [phone] Cell: [phone]

## 2018-05-10 NOTE — Progress Notes (Signed)
Spoke with Dr. Lysle Pearl of General Surgery regarding concerns that pt could be actively bleeding and that GI and Vascular Surgery have no additional interventions at this time, and recommend Surgical Intervention.  Dr. Lysle Pearl recommends that pt be transferred to [facility] for Duodenal Bulb Ulcer repair.  Dr. Lysle Pearl will be at bedside shortly to evaluate pt and help with initiation of transfer process.  Updated pt of his need for transfer to higher level of care, of which he is in agreement with.  Again offered placement of central line in order to continue treating pt, of which he again refuses.   Darel Hong, AGACNP-BC [place] [facility] Pager: [phone] Cell: [phone]

## 2018-05-10 NOTE — Progress Notes (Signed)
Spoke with Dr. Lucky Cowboy of Vascular Surgery regarding concern for active bleeding of duodenal ulcer given hypotension and drop in hemoglobin despite previous transfusions.  Per Dr. Lucky Cowboy, there is no additional intervention from a vascular standpoint, will need to call General Surgery.  Have paged General Surgery, awaiting callback.   Darel Hong, AGACNP-BC [place] [facility] Pager: [phone] Cell: [phone]

## 2018-05-10 NOTE — Progress Notes (Signed)
[facility] ground transport here at this time. Report and paperwork given to [facility], [place]. Pt care transferred to [facility] team at this time without incident.

## 2018-05-10 NOTE — Progress Notes (Signed)
Called to bedside by nursing due to hypotension (79/51, MAP 62), otherwise patient hemodynamically stable, sinus rhythm 77, O2 sats 100% on nasal cannula, respiratory rate 12.  Patient is asymptomatic, lethargic but arouses to voice and answers questions appropriately, oriented.  Nursing does report that patient had 1 large BM,  dark maroon-colored with clots.  Have ordered for repeat H&H which is currently pending.  Will order for 250 normal saline bolus and Neo-Synephrine if needed.  Awaiting results of H&H.  Discussed with patient possible need for central line placement given concern that he could be possibly bleeding need for multiple infusions.  Patient is very adamant that he does not want central line placed, he does not give consent for the procedure currently.    Darel Hong, AGACNP-BC [place] [facility] Pager: [phone] Cell: [phone]

## 2018-05-10 NOTE — Progress Notes (Signed)
[facility], [place] 05/10/18  Subjective:  Patient transferred back to critical care unit. He continues to have bleeding from duodenal ulcer. Patient currently awaiting transfer to [facility] as surgery has recommended referral to tertiary care center. Patient is due for dialysis today this will likely be delayed so as to allow transfer.  Objective:  Vital signs in last 24 hours:  Temp:  [97.6 F (36.4 C)-99.8 F (37.7 C)] 98.8 F (37.1 C) (03/18 0624) Pulse Rate:  [67-99] 77 (03/18 0624) Resp:  [10-30] 18 (03/18 0624) BP: (45-125)/(20-99) 110/73 (03/18 0624) SpO2:  [91 %-100 %] 97 % (03/18 0624) Weight:  [78.3 kg] 78.3 kg (03/17 1338)  Weight change: -1.7 kg Filed Weights   05/08/18 1042 05/08/18 1418 05/09/18 1338  Weight: 80 kg 78.3 kg 78.3 kg    Intake/Output:    Intake/Output Summary (Last 24 hours) at 05/10/2018 0859 Last data filed at 05/10/2018 3903 Gross per 24 hour  Intake 1320.23 ml  Output 0 ml  Net 1320.23 ml     Physical Exam: General:  no acute distress  HEENT  moist oral mucous membranes  Neck  supple  Pulm/lungs  CTAB normal effort  CVS/Heart  S1S2 no rubs  Abdomen:   Soft, nontender, BS present  Extremities:  No edema  Neurologic:  Awake, alert, following commands  Skin:  no acute rashes  Access:  Left upper extremity AV fistula       Basic Metabolic Panel:  Recent Labs  Lab 05/05/18 1611 05/05/18 2043 05/06/18 0048 05/06/18 0522 05/07/18 0458 05/08/18 0523 05/08/18 1103 05/09/18 1550 05/10/18 0138  NA 135 135 135 135 136 131* 128* 133* 136  K 3.9 3.7 3.7 3.7 3.5 3.7 3.5 4.0 4.6  CL 102 102 102 103 104 99 97* 101 103  CO2 23 24 25 24 26 23  21* 25 23  GLUCOSE 108* 153* 124* 107* 95 92 117* 108* 93  BUN 30* 26* 24* 21 28* 37* 37* 34* 39*  CREATININE 2.49* 2.16* 2.15* 1.92* 3.32* 4.80* 4.92* 4.21* 4.61*  CALCIUM 7.8* 7.7* 7.8* 7.9* 7.4* 7.2* 7.1* 7.1* 7.4*  MG 1.8 1.9 2.0 1.9 2.0  --   --   --   --   PHOS  4.9* 4.2 4.2 3.7 4.5  --  5.7*  --   --      CBC: Recent Labs  Lab 05/03/18 1747  05/06/18 0522 05/07/18 0458 05/08/18 0523 05/09/18 0134 05/09/18 1117 05/09/18 1550 05/10/18 0138  WBC 8.8   < > 11.5* 8.8 6.4 7.8  --   --  13.0*  NEUTROABS 6.1  --   --   --   --   --   --   --   --   HGB 5.5*   < > 8.2* 7.5* 7.2* 7.8* 6.1* 6.9* 6.3*  HCT 18.5*   < > 25.5* 23.9* 22.2* 24.0*  --  21.3* 19.2*  MCV 100.0   < > 92.7 94.8 94.1 92.3  --   --  92.8  PLT 183   < > 107* 104* 118* 130*  --   --  138*   < > = values in this interval not displayed.      Lab Results  Component Value Date   HEPBSAG Negative 04/18/2018   HEPBSAB Reactive 02/15/2017      Microbiology:  Recent Results (from the past 240 hour(s))  MRSA PCR Screening     Status: None   Collection Time: 05/03/18  8:59 PM  Result Value Ref Range Status   MRSA by PCR NEGATIVE NEGATIVE Final    Comment:        The GeneXpert MRSA Assay (FDA approved for NASAL specimens only), is one component of a comprehensive MRSA colonization surveillance program. It is not intended to diagnose MRSA infection nor to guide or monitor treatment for MRSA infections. Performed at [facility], [address] [place], [place] [address]     Coagulation Studies: No results for input(s): LABPROT, INR in the last 72 hours.  Urinalysis: No results for input(s): COLORURINE, LABSPEC, PHURINE, GLUCOSEU, HGBUR, BILIRUBINUR, KETONESUR, PROTEINUR, UROBILINOGEN, NITRITE, LEUKOCYTESUR in the last 72 hours.  Invalid input(s): APPERANCEUR    Imaging: Dg Chest 1 View  Result Date: 05/09/2018 CLINICAL DATA:  Chest pain EXAM: CHEST  1 VIEW COMPARISON:  May 09, 2018 study obtained earlier in the day FINDINGS: There is persistent cardiomegaly with pulmonary venous hypertension. There is a small left pleural effusion. There is patchy interstitial edema. No frank airspace consolidation. No adenopathy. There is aortic atherosclerosis.  There is a stent in the left innominate region. No bone lesions. IMPRESSION: Stable findings felt to represent a degree of congestive heart failure. No frank consolidation. Aortic Atherosclerosis (ICD10-I70.0). Electronically Signed   By: Lowella Grip III M.D.   On: 05/09/2018 09:08   Dg Chest Port 1 View  Result Date: 05/10/2018 CLINICAL DATA:  Leukocytosis EXAM: PORTABLE CHEST 1 VIEW COMPARISON:  05/09/2018 FINDINGS: Cardiomegaly. Consolidation in the right lower lobe with small right effusion. Findings concerning for pneumonia. Mild vascular congestion. No acute bony abnormality. IMPRESSION: Cardiomegaly, vascular congestion. Right lower lobe consolidation concerning for pneumonia. Small right effusion. Electronically Signed   By: Rolm Baptise M.D.   On: 05/10/2018 03:15   Dg Chest Port 1 View  Result Date: 05/09/2018 CLINICAL DATA:  Worsening cough. EXAM: PORTABLE CHEST 1 VIEW COMPARISON:  05/06/2018 and older exams FINDINGS: Stable cardiomegaly. Lungs show vascular congestion, interstitial thickening and hazy basilar opacity, most evident on the left. These findings are similar to the most recent prior exam consistent with mild congestive heart failure. Suspect small pleural effusions. Stable left subclavian vascular stent. No pneumothorax. IMPRESSION: No significant change from the most recent prior exam. Findings consistent with mild congestive heart failure with cardiomegaly, interstitial edema and small effusions. Electronically Signed   By: Lajean Manes M.D.   On: 05/09/2018 01:49     Medications:   . pantoprozole (PROTONIX) infusion 8 mg/hr (05/10/18 0347)  . phenylephrine (NEO-SYNEPHRINE) Adult infusion Stopped (05/10/18 0832)  . phenylephrine (NEO-SYNEPHRINE) Adult infusion 8 mcg/min (05/10/18 0831)  . vancomycin     . sodium chloride   Intravenous Once  . amiodarone  200 mg Oral Daily  . atorvastatin  40 mg Oral q1800  . bismuth subsalicylate  15 mL Oral QID  . [START ON  05/11/2018] ceFEPime (MAXIPIME) IV  500 mg Intravenous Q24H  . chlorhexidine  15 mL Mouth Rinse BID  . Chlorhexidine Gluconate Cloth  6 each Topical Daily  . epoetin (EPOGEN/PROCRIT) injection  4,000 Units Intravenous Q M,W,F-HD  . feeding supplement (NEPRO CARB STEADY)  237 mL Oral BID BM  . folic acid  1 mg Oral Daily  . mouth rinse  15 mL Mouth Rinse q12n4p  . metroNIDAZOLE  250 mg Oral QID  . multivitamin  1 tablet Oral QHS  . [START ON 05/13/2018] pantoprazole  40 mg Intravenous Q12H  . tetracycline  500 mg Oral Daily  . vitamin  B-12  1,000 mcg Oral Daily  . vitamin C  500 mg Oral BID   acetaminophen **OR** acetaminophen, benzonatate, haloperidol lactate, ipratropium-albuterol, LORazepam, morphine injection, nitroGLYCERIN, [DISCONTINUED] ondansetron **OR** ondansetron (ZOFRAN) IV, ondansetron (ZOFRAN) IV, polyethylene glycol  Assessment/ Plan:  63 y.o. male withend stage renal disease on hemodialysis, hypertension, hepatitis C, anemia, seizure disorder, COPD/tobacco abuse,substance abuse and alcohol abuse,secondary hyperparathyroidism, chronic systolic CHF  [facility] [address]Left arm AVG. 195 minutes/73.5 kg  1.  End-stage renal disease with severe hyperkalemia Patient due for dialysis today however we will delay his dialysis treatment so as to allow transfer to [facility] today.  Hopefully dialysis can be performed later today there.  2.  Severe anemia of chronic kidney disease and GI bleed Recent upper endoscopy showed 1 cm ulcer in the duodenal bulb and H. pylori antigen positive.  Patient presented again with hematemesis and hematochezia, hypotension and altered mental status.  S/p multiple units of blood transfusion. EGD showed that the esophagus was normal, 1 nonbleeding duodenal ulcer with visible vessel was found.  Coagulation for hemostasis was done and a hemostatic clip was placed by endoscopy. -Patient continues to have ongoing bleeding from his duodenal  ulcer.  General surgery has recommended transfer to [facility] for definitive treatment.  3.  Cardiac July 2019 echo moderate to severe mitral regurgitation, calcified mitral valve, 35 to 40%, pulmonary hypertension -pressure greater than 65 mmHg Off pressors  4.  Acute respiratory failure Extubated March 13. -Patient was on ventilator earlier than admission.  He has been weaned off and appears to be breathing comfortably.  5.  Agitation ?  Alcohol withdrawal, appears resolved.   6.  Secondary hyperparathyroidism.  Patient currently off of binder therapy.  Continue to periodically monitor serum phosphorus.          LOS: 7 Loris Winrow 3/18/20208:59 AM  [place], [place]  Note: This note was prepared with Dragon dictation. Any transcription errors are unintentional

## 2018-05-10 NOTE — Consult Note (Signed)
Pharmacy Antibiotic Note  [name] is a 63 y.o. male admitted on 05/03/2018 with pneumonia.  Pharmacy has been consulted for Cefepime and Vancomycin dosing. Patient has ESRD and is on HD (MWF).  Plan: Vancomycin 1750mg  IV x 1 dose, followed by vancomycin 750mg  IV with each HD. Plan to check vancomycin level prior to 3rd HD session.  Target Pre-HD Level 15-25 mcg/mL  Cefepime 1g IV x 1 dose, followed by cefepime 500mg  daily.    Height: 6' (182.9 cm) Weight: 172 lb 9.9 oz (78.3 kg) IBW/kg (Calculated) : 77.6  Temp (24hrs), Avg:98.3 F (36.8 C), Min:97.6 F (36.4 C), Max:99.1 F (37.3 C)  Recent Labs  Lab 05/06/18 0522 05/07/18 0458 05/08/18 0523 05/08/18 1103 05/09/18 0134 05/09/18 1550 05/10/18 0138  WBC 11.5* 8.8 6.4  --  7.8  --  13.0*  CREATININE 1.92* 3.32* 4.80* 4.92*  --  4.21* 4.61*    Estimated Creatinine Clearance: 17.8 mL/min (A) (by C-G formula based on SCr of 4.61 mg/dL (H)).    Allergies  Allergen Reactions  . Sulfa Antibiotics Rash and Shortness Of Breath    Antimicrobials this admission: 3/16 Flagyl>>  3/18 vancomycin >>  3/18 cefepime >>   Microbiology results: 3/11 Sputum: pending  3/11 MRSA PCR: negative   Thank you for allowing pharmacy to be a part of this patient's care.  Pernell Dupre, PharmD, BCPS Clinical Pharmacist 05/10/2018 4:32 AM

## 2018-05-10 NOTE — Progress Notes (Signed)
Pt's hemoglobin is 6.3, concern that pt is actively bleeding.  Have ordered for pt to receive 2 additional units pRBC's.  Called and spoke with Dr. Vicente Males of GI, who recommends to call Vascular surgery, and if Vascular Surgery has no further recommendations, then to call General Surgery.  Have placed call to Vascular Surgery, awaiting callback.  Darel Hong, AGACNP-BC [facility] Pager: [phone] Cell: [phone]

## 2018-05-10 NOTE — Progress Notes (Signed)
[facility] at [place] NAME: [name]    MR#:  [id number]  DATE OF BIRTH:  Apr 19, 1955  SUBJECTIVE:  Patient being transferred to [facility] due to further bleeding under went coil embolization of the gastroduodenal artery yesterday Last night hemioglobin dropped and BP dropped  Had 2 units ordered yesterday before procedure  REVIEW OF SYSTEMS:    Review of Systems  Constitutional: Negative for fever, chills weight loss ++weakness HENT: Negative for ear pain, nosebleeds, congestion, facial swelling, rhinorrhea, neck pain, neck stiffness and ear discharge.   Respiratory: Negative for cough, shortness of breath, wheezing  Cardiovascular: ++ chest pain,no palpitations and leg swelling.  Gastrointestinal: Negative for heartburn, abdominal pain, vomiting, diarrhea or consitpation + bloody stools Genitourinary: Negative for dysuria, urgency, frequency, hematuria Musculoskeletal: Negative for back pain or joint pain Neurological: Negative for dizziness, seizures, syncope, focal weakness,  numbness and headaches.  Hematological: Does not bruise/bleed easily.  Psychiatric/Behavioral: Negative for hallucinations, confusion, dysphoric mood    Tolerating Diet: yes      DRUG ALLERGIES:   Allergies  Allergen Reactions  . Sulfa Antibiotics Rash and Shortness Of Breath    VITALS:  Blood pressure 110/73, pulse 77, temperature 98.8 F (37.1 C), temperature source Oral, resp. rate 18, height 6' (1.829 m), weight 78.3 kg, SpO2 97 %.  PHYSICAL EXAMINATION:  Constitutional: Appears well-developed and well-nourished. No distress. HENT: Normocephalic. [name] Oropharynx is clear and moist.  Eyes: Conjunctivae and EOM are normal. PERRLA, no scleral icterus.  Neck: Normal ROM. Neck supple. No JVD. No tracheal deviation. CVS: RRR, S1/S2 +, no murmurs, no gallops, no carotid bruit.  Pulmonary: Effort and breath sounds normal, no stridor, rhonchi, wheezes, rales.   Abdominal: Soft. BS +,  no distension, tenderness, rebound or guarding.  Musculoskeletal: Normal range of motion. No edema and no tenderness.  Neuro: Alert. CN 2-12 grossly intact. No focal deficits. Skin: Skin is warm and dry. No rash noted. Psychiatric: Normal mood and affect.      LABORATORY PANEL:   CBC Recent Labs  Lab 05/10/18 0138  WBC 13.0*  HGB 6.3*  HCT 19.2*  PLT 138*   ------------------------------------------------------------------------------------------------------------------  Chemistries  Recent Labs  Lab 05/03/18 1747  05/07/18 0458  05/10/18 0138  NA 142   < > 136   < > 136  K 4.7   < > 3.5   < > 4.6  CL 113*   < > 104   < > 103  CO2 23   < > 26   < > 23  GLUCOSE 98   < > 95   < > 93  BUN 38*   < > 28*   < > 39*  CREATININE 3.49*   < > 3.32*   < > 4.61*  CALCIUM 6.8*   < > 7.4*   < > 7.4*  MG  --    < > 2.0  --   --   AST 20  --   --   --   --   ALT 14  --   --   --   --   ALKPHOS 61  --   --   --   --   BILITOT 0.6  --   --   --   --    < > = values in this interval not displayed.   ------------------------------------------------------------------------------------------------------------------  Cardiac Enzymes Recent Labs  Lab 05/09/18 0704 05/09/18 1550 05/09/18 2256  TROPONINI 0.94* 0.75*  0.71*   ------------------------------------------------------------------------------------------------------------------  RADIOLOGY:  Dg Chest 1 View  Result Date: 05/09/2018 CLINICAL DATA:  Chest pain EXAM: CHEST  1 VIEW COMPARISON:  May 09, 2018 study obtained earlier in the day FINDINGS: There is persistent cardiomegaly with pulmonary venous hypertension. There is a small left pleural effusion. There is patchy interstitial edema. No frank airspace consolidation. No adenopathy. There is aortic atherosclerosis. There is a stent in the left innominate region. No bone lesions. IMPRESSION: Stable findings felt to represent a degree of congestive  heart failure. No frank consolidation. Aortic Atherosclerosis (ICD10-I70.0). Electronically Signed   By: Lowella Grip III M.D.   On: 05/09/2018 09:08   Dg Chest Port 1 View  Result Date: 05/10/2018 CLINICAL DATA:  Leukocytosis EXAM: PORTABLE CHEST 1 VIEW COMPARISON:  05/09/2018 FINDINGS: Cardiomegaly. Consolidation in the right lower lobe with small right effusion. Findings concerning for pneumonia. Mild vascular congestion. No acute bony abnormality. IMPRESSION: Cardiomegaly, vascular congestion. Right lower lobe consolidation concerning for pneumonia. Small right effusion. Electronically Signed   By: Rolm Baptise M.D.   On: 05/10/2018 03:15   Dg Chest Port 1 View  Result Date: 05/09/2018 CLINICAL DATA:  Worsening cough. EXAM: PORTABLE CHEST 1 VIEW COMPARISON:  05/06/2018 and older exams FINDINGS: Stable cardiomegaly. Lungs show vascular congestion, interstitial thickening and hazy basilar opacity, most evident on the left. These findings are similar to the most recent prior exam consistent with mild congestive heart failure. Suspect small pleural effusions. Stable left subclavian vascular stent. No pneumothorax. IMPRESSION: No significant change from the most recent prior exam. Findings consistent with mild congestive heart failure with cardiomegaly, interstitial edema and small effusions. Electronically Signed   By: Lajean Manes M.D.   On: 05/09/2018 01:49     ASSESSMENT AND PLAN:   63 year old male with end-stage renal disease on hemodialysis, COPD, EtOH abuse and recent discharge from the hospital with GI bleed who presented with massive GI bleed.  1.  Acute upper GI bleed: Patient underwent EGD EGD on 05/04/2018, duodenal ulcer with visible vessel in the bulb status post cautery and clip placed adjacent to the ulcer Coil embolization 3/17 Still with bleeding No options but to transfer patient   2.  Acute on chronic blood loss anemia: Patient received multiple blood transfusions.     Patient on replacement for B12 and folate acid.     3.  End-stage renal disease with severe hyperkalemia: He was on CRRT initally but has now been on HD Monday, Wednesday and Friday.  4.  Hyperkalemia: This has normalized  5.  Acute hypoxic respiratory failure with inability to protect airway: Patient intubated on admission and  Has been successfully extubated. Patient doing well from a respiratory standpoint  6.  EtOH abuse: Patient counseled against drinking EtOH.  7. Chest pain due to demand ischemia from low hemoglobin/GIB: Patient seen by Cardiology. Patient not a candidate for any intervention or heparin due to GI bleed.   8.  Hyponatremia: stable   tx to [facility]   Management plans discussed with the patient and he is in agreement.  CODE STATUS: full  TOTAL TIME TAKING CARE OF THIS PATIENT: 26 minutes.     POSSIBLE D/C today to Phillis Haggis M.D on 05/10/2018 at 9:22 AM  Between 7am to 6pm - Pager - [phone] After 6pm go to [email or web address] - password [email or web address] [facility]  Office  [phone]  CC: Primary care physician; Donnie Coffin, MD  Note: This dictation was  prepared with Dragon dictation along with smaller phrase technology. Any transcriptional errors that result from this process are unintentional.

## 2018-05-10 NOTE — Consult Note (Signed)
Subjective:   CC: Duodenal ulcer bleed  HPI:  [name] is a 63 y.o. male with significant medical history as listed below, consulted by Dewaine Conger for evaluation of above cc.  Symptoms were first noted 1 month ago.  Presented with melena after hemodialysis.  He underwent an EGD on the 04/20/2018 by the GI service where they found a ulcer at the duodenal bulb with no signs of active bleeding.  No specific endoscopic interventions were completed at that time.  The melena resolved and his hemoglobin stabilized so he was discharged.    He returned on May 03, 2018 for similar symptoms.  Underwent an EGD on March 12, which again noted the duodenal bulb ulcer with no signs of active bleeding.  There is documentation that they were able to see a vessel at that time, an attempt was made to place a clip but was unsuccessful due to its location within the duodenum.  A clip was placed as marking purposes only adjacent to it, and the vessel itself was cauterized.    Despite this intervention the patient showed continued signs of bleeding and a drop in hemoglobin, at which point vascular surgery was consulted and the patient under went coil embolization of the gastroduodenal artery, which was successful at the time.  He returned to the ICU for further monitoring and later in the evening, had an episode of melena with systolic dropping into the 70s, with an inadequate response in the hemoglobin levels despite 4 units of PRBC since this admission.  GI and vascular service were again notified and they recommended surgical intervention.  At this time, patient is alert was able to answer questions appropriately.  He states that he is not in any pain and never had any pain for this issue.  Denies any nausea, vomiting.  Past Medical History:  has a past medical history of Arthritis, CAD (coronary artery disease), Chronic combined systolic and diastolic CHF (congestive heart failure) ([place]), Diabetes ([place]), Dyspnea, ESRD  (end stage renal disease) ([place]), Hepatitis, Hypertension, Hypertensive heart disease with CHF (congestive heart failure) ([place]), Mixed Ischemic and non-ischemic cardiomyopathy, Moderate Aortic insufficiency, Myocardial infarction [facility]), Peripheral vascular disease ([place]), Pulmonary hypertension ([place]), and PVC's (premature ventricular contractions).  Past Surgical History:  has a past surgical history that includes AV fistula placement (Left, 2014); Cardiac catheterization (N/A, 10/03/2014); Cardiac catheterization (Left, 10/03/2014); Cardiac catheterization (Left, 05/01/2015); Cardiac catheterization (N/A, 05/01/2015); A/V Fistulagram (Left, 04/06/2016); A/V SHUNT INTERVENTION (N/A, 04/06/2016); Upper Extremity Angiography (Bilateral, 06/29/2016); UPPER EXTREMITY INTERVENTION (06/29/2016); A/V SHUNT INTERVENTION (N/A, 06/29/2016); DIALYSIS/PERMA CATHETER INSERTION (Right); A/V Fistulagram (Left, 01/19/2017); UPPER EXTREMITY VENOGRAPHY (Left, 03/15/2017); AV fistula placement (Left, 06/01/2017); DIALYSIS/PERMA CATHETER REMOVAL (N/A, 07/05/2017); Esophagogastroduodenoscopy (egd) with propofol (N/A, 04/20/2018); and Esophagogastroduodenoscopy (N/A, 05/04/2018).  Family History: family history includes Diabetes in his mother, sister, and son; Hypertension in his mother and son.  Social History:  reports that he quit smoking about 18 months ago. His smoking use included cigarettes. He has a 10.00 pack-year smoking history. He has never used smokeless tobacco. He reports current drug use. Drugs: Cocaine and Marijuana. He reports that he does not drink alcohol.  Current Medications:  Medications Prior to Admission  Medication Sig Dispense Refill  . acetaminophen (TYLENOL) 325 MG tablet Take 2 tablets (650 mg total) by mouth every 6 (six) hours as needed for mild pain (or Fever >/= 101). (Patient taking differently: Take 500 mg by mouth every 8 (eight) hours as needed for mild pain (or Fever >/= 101). )    .  albuterol (PROVENTIL  HFA;VENTOLIN HFA) 108 (90 Base) MCG/ACT inhaler Inhale 2 puffs into the lungs every 4 (four) hours as needed for wheezing or shortness of breath.     [name] amiodarone (PACERONE) 200 MG tablet Take 1 tablet (200 mg total) by mouth daily. 30 tablet 6  . aspirin EC 81 MG tablet Take 1 tablet (81 mg total) by mouth daily. 150 tablet 2  . atorvastatin (LIPITOR) 40 MG tablet Take 1 tablet (40 mg total) by mouth daily. (Patient taking differently: Take 40 mg by mouth every evening. ) 30 tablet 1  . calcium acetate (PHOSLO) 667 MG capsule Take 1,334-2,001 mg by mouth 3 (three) times daily with meals. take 3 capsules (2001mg ) by mouth three times daily before each meal, and 2 capsules (667mg ) by mouth with snacks twice daily    . cinacalcet (SENSIPAR) 90 MG tablet Take 60 mg by mouth daily.     . cyclobenzaprine (FLEXERIL) 5 MG tablet Take 5 mg by mouth daily.    . diphenhydrAMINE (BENADRYL) 25 mg capsule Take 1 capsule (25 mg total) by mouth every 6 (six) hours as needed for itching or allergies. 30 capsule 0  . ferrous sulfate 325 (65 FE) MG EC tablet Take 1 tablet (325 mg total) by mouth 2 (two) times daily. 60 tablet 2  . ipratropium (ATROVENT HFA) 17 MCG/ACT inhaler Inhale 2 puffs into the lungs 4 (four) times daily as needed for wheezing.     . lidocaine (LIDODERM) 5 % Place 1 patch onto the skin daily. Remove & Discard patch within 12 hours or as directed by MD 15 patch 0  . lidocaine-prilocaine (EMLA) cream Apply 1 application topically as needed (dialysis access).    . multivitamin (RENA-VIT) TABS tablet Take 1 tablet by mouth at bedtime. 90 tablet 0  . nitroGLYCERIN (NITROSTAT) 0.4 MG SL tablet Place 0.4 mg under the tongue every 5 (five) minutes as needed for chest pain.    . Nutritional Supplements (FEEDING SUPPLEMENT, NEPRO CARB STEADY,) LIQD Take 237 mLs by mouth 2 (two) times daily between meals. 60 Can 0  . omeprazole (PRILOSEC) 40 MG capsule Take 1 capsule (40 mg total) by mouth 2 (two) times  daily. 60 capsule 1  . traMADol (ULTRAM) 50 MG tablet Take 1 tablet (50 mg total) by mouth every 6 (six) hours as needed for moderate pain or severe pain. 15 tablet 0    Allergies:  Allergies as of 05/03/2018 - Review Complete 05/03/2018  Allergen Reaction Noted  . Sulfa antibiotics Rash and Shortness Of Breath 05/17/2013    ROS:  General: Denies weight loss, weight gain, fatigue, fevers, chills, and night sweats. Eyes: Denies blurry vision, double vision, eye pain, itchy eyes, and tearing. Ears: Denies hearing loss, earache, and ringing in ears. Nose: Denies sinus pain, congestion, infections, runny nose, and nosebleeds. Mouth/throat: Denies hoarseness, sore throat, bleeding gums, and difficulty swallowing. Heart: Denies chest pain, palpitations, racing heart, irregular heartbeat, leg pain or swelling, and decreased activity tolerance. Respiratory: Denies breathing difficulty, shortness of breath, wheezing, cough, and sputum. GI: Denies change in appetite, heartburn, nausea, vomiting, constipation GU: Denies difficulty urinating, pain with urinating, urgency, frequency, blood in urine. Musculoskeletal: Denies joint stiffness, pain, swelling, muscle weakness. Skin: Denies rash, itching, mass, tumors, sores, and boils Neurologic: Denies headache, fainting, dizziness, seizures, numbness, and tingling. Psychiatric: Denies depression, anxiety, difficulty sleeping, and memory loss. Endocrine: Denies heat or cold intolerance, and increased thirst or urination. Blood/lymph: Denies easy bruising, easy bruising, and swollen  glands     Objective:     BP (!) 86/60   Pulse 75   Temp 98.6 F (37 C) (Oral)   Resp 12   Ht 6' (1.829 m)   Wt 78.3 kg   SpO2 100%   BMI 23.41 kg/m  Currently on 11mcg/min of neo   Constitutional :  alert, cooperative, appears stated age and no distress  Lymphatics/Throat:  no asymmetry, masses, or scars  Respiratory:  clear to auscultation bilaterally, 2L [place]   Cardiovascular:  regular rate and rhythm  Gastrointestinal: soft, non-tender; bowel sounds normal; no masses,  no organomegaly.   Musculoskeletal: Steady gait and movement  Skin: Cool and moist, AV fistula noted in LUE, two PIV on right  Psychiatric: Normal affect, non-agitated, not confused       LABS:  CMP Latest Ref Rng & Units 05/10/2018 05/09/2018 05/08/2018  Glucose 70 - 99 mg/dL 93 108(H) 117(H)  BUN 8 - 23 mg/dL 39(H) 34(H) 37(H)  Creatinine 0.61 - 1.24 mg/dL 4.61(H) 4.21(H) 4.92(H)  Sodium 135 - 145 mmol/L 136 133(L) 128(L)  Potassium 3.5 - 5.1 mmol/L 4.6 4.0 3.5  Chloride 98 - 111 mmol/L 103 101 97(L)  CO2 22 - 32 mmol/L 23 25 21(L)  Calcium 8.9 - 10.3 mg/dL 7.4(L) 7.1(L) 7.1(L)  Total Protein 6.5 - 8.1 g/dL - - -  Total Bilirubin 0.3 - 1.2 mg/dL - - -  Alkaline Phos 38 - 126 U/L - - -  AST 15 - 41 U/L - - -  ALT 0 - 44 U/L - - -   CBC Latest Ref Rng & Units 05/10/2018 05/09/2018 05/09/2018  WBC 4.0 - 10.5 K/uL 13.0(H) - -  Hemoglobin 13.0 - 17.0 g/dL 6.3(L) 6.9(L) 6.1(L)  Hematocrit 39.0 - 52.0 % 19.2(L) 21.3(L) -  Platelets 150 - 400 K/uL 138(L) - -     RADS: CLINICAL DATA:  Leukocytosis  EXAM: PORTABLE CHEST 1 VIEW  COMPARISON:  05/09/2018  FINDINGS: Cardiomegaly. Consolidation in the right lower lobe with small right effusion. Findings concerning for pneumonia. Mild vascular congestion. No acute bony abnormality.  IMPRESSION: Cardiomegaly, vascular congestion.  Right lower lobe consolidation concerning for pneumonia. Small right effusion.   Electronically Signed   By: Rolm Baptise M.D.   On: 05/10/2018 03:15  Assessment:      63 year old male with multiple comorbidities including CHF, history of MI, hepatitis, ESRD on HD, coronary artery disease, peripheral artery disease, cocaine and marijuana use presenting with recurrent duodenal bulb ulcer bleeding, still requiring pressor support and inadequate response to PRBC transfusions despite EGD  x2, as well as coil embolization.  Plan:     I was notified of the consult at 3:30 AM via epic chat, was at bedside by 3:45 AM.  Exam noted above reflects the physical exam at that time.  All nonsurgical interventions have been exhausted at this point here at [facility].  Due to the location of the ulcer, my recommendation is urgent transfer to a tertiary care center with appropriate surgical expertise to manage his ulcer.  I have reached out to [facility], where Dr. Joneen Caraway graciously accepted his transfer to their SICU.  They have requested that we arrange the transportation and formal bed assignment will be called back to [place].  We will continue his antibiotic coverage for his ulcer as well as his increasing leukocytosis, fluid support, and pressor support as needed.  In preparation for the transfer, 2 additional units of PRBCs have been ordered, of which 1 unit  is currently being transfused.  Respiratory status has been stable for this admission, with patient O2 at 100% on 2 L nasal cannula at this time.  I will continue to monitor closely in the ICU along with the ICU team, and ensure there is no possible concern for any airway issues prior to transfer.  We will have a very low threshold for intubation if needed.  Regarding IV access, he currently has 2 peripheral IVs in his right arm.  But the ICU is in the process of inserting a central line.  If the patient acutely decompensates and is deemed too unstable for transfer, we will proceed with urgent surgical intervention here at [facility].

## 2018-05-11 LAB — TYPE AND SCREEN
ABO/RH(D): O POS
Antibody Screen: NEGATIVE
UNIT DIVISION: 0
Unit division: 0
Unit division: 0
Unit division: 0
Unit division: 0

## 2018-05-11 LAB — BPAM RBC
BLOOD PRODUCT EXPIRATION DATE: 202004092359
Blood Product Expiration Date: 202003182359
Blood Product Expiration Date: 202004092359
Blood Product Expiration Date: 202004092359
Blood Product Expiration Date: 202004112359
ISSUE DATE / TIME: 202003162124
ISSUE DATE / TIME: 202003171228
ISSUE DATE / TIME: 202003171726
ISSUE DATE / TIME: 202003180428
ISSUE DATE / TIME: 202003180224
Unit Type and Rh: 5100
Unit Type and Rh: 5100
Unit Type and Rh: 5100
Unit Type and Rh: 5100
Unit Type and Rh: 9500

## 2018-05-13 LAB — COCAINE,MS,WB/SP RFX
Benzoylecgonine: >1500 ng/mL
Cocaine Confirmation: POSITIVE
Cocaine: NEGATIVE ng/mL

## 2018-05-17 LAB — DRUG SCREEN 10 W/CONF, SERUM
Amphetamines, IA: NEGATIVE ng/mL
Barbiturates, IA: NEGATIVE ug/mL
Benzodiazepines, IA: NEGATIVE ng/mL
Cocaine & Metabolite, IA: POSITIVE ng/mL
Methadone, IA: NEGATIVE ng/mL
Opiates, IA: NEGATIVE ng/mL
Oxycodones, IA: NEGATIVE ng/mL
PHENCYCLIDINE, IA: NEGATIVE ng/mL
Propoxyphene, IA: NEGATIVE ng/mL
THC(Marijuana) Metabolite, IA: NEGATIVE ng/mL

## 2018-05-17 LAB — BENZODIAZEPINES,MS,WB/SP RFX
7-Aminoclonazepam: NEGATIVE ng/mL
Alprazolam: NEGATIVE ng/mL
Benzodiazepines Confirm: NEGATIVE
CLONAZEPAM: NEGATIVE ng/mL
Chlordiazepoxide: NEGATIVE ng/mL
Desalkylflurazepam: NEGATIVE ng/mL
Desmethylchlordiazepoxide: NEGATIVE ng/mL
Desmethyldiazepam: NEGATIVE ng/mL
Diazepam: NEGATIVE ng/mL
Flurazepam: NEGATIVE ng/mL
Lorazepam: NEGATIVE ng/mL
Midazolam: NEGATIVE ng/mL
OXAZEPAM: NEGATIVE ng/mL
Temazepam: NEGATIVE ng/mL
Triazolam: NEGATIVE ng/mL

## 2018-08-05 ENCOUNTER — Emergency Department: Payer: Medicare Other

## 2018-08-05 ENCOUNTER — Other Ambulatory Visit: Payer: Self-pay

## 2018-08-05 DIAGNOSIS — R6520 Severe sepsis without septic shock: Secondary | ICD-10-CM | POA: Diagnosis not present

## 2018-08-05 DIAGNOSIS — N186 End stage renal disease: Secondary | ICD-10-CM | POA: Diagnosis present

## 2018-08-05 DIAGNOSIS — J449 Chronic obstructive pulmonary disease, unspecified: Secondary | ICD-10-CM | POA: Diagnosis present

## 2018-08-05 DIAGNOSIS — J9811 Atelectasis: Secondary | ICD-10-CM | POA: Diagnosis present

## 2018-08-05 DIAGNOSIS — M25519 Pain in unspecified shoulder: Secondary | ICD-10-CM

## 2018-08-05 DIAGNOSIS — I4891 Unspecified atrial fibrillation: Secondary | ICD-10-CM | POA: Diagnosis present

## 2018-08-05 DIAGNOSIS — Z992 Dependence on renal dialysis: Secondary | ICD-10-CM

## 2018-08-05 DIAGNOSIS — M1990 Unspecified osteoarthritis, unspecified site: Secondary | ICD-10-CM | POA: Diagnosis present

## 2018-08-05 DIAGNOSIS — Z882 Allergy status to sulfonamides status: Secondary | ICD-10-CM

## 2018-08-05 DIAGNOSIS — I132 Hypertensive heart and chronic kidney disease with heart failure and with stage 5 chronic kidney disease, or end stage renal disease: Secondary | ICD-10-CM | POA: Diagnosis present

## 2018-08-05 DIAGNOSIS — R05 Cough: Secondary | ICD-10-CM

## 2018-08-05 DIAGNOSIS — Z87891 Personal history of nicotine dependence: Secondary | ICD-10-CM

## 2018-08-05 DIAGNOSIS — R059 Cough, unspecified: Secondary | ICD-10-CM

## 2018-08-05 DIAGNOSIS — I5042 Chronic combined systolic (congestive) and diastolic (congestive) heart failure: Secondary | ICD-10-CM | POA: Diagnosis present

## 2018-08-05 DIAGNOSIS — I4892 Unspecified atrial flutter: Secondary | ICD-10-CM | POA: Diagnosis not present

## 2018-08-05 DIAGNOSIS — E1122 Type 2 diabetes mellitus with diabetic chronic kidney disease: Secondary | ICD-10-CM | POA: Diagnosis present

## 2018-08-05 DIAGNOSIS — E872 Acidosis, unspecified: Secondary | ICD-10-CM

## 2018-08-05 DIAGNOSIS — N2581 Secondary hyperparathyroidism of renal origin: Secondary | ICD-10-CM | POA: Diagnosis present

## 2018-08-05 DIAGNOSIS — A419 Sepsis, unspecified organism: Secondary | ICD-10-CM | POA: Diagnosis not present

## 2018-08-05 DIAGNOSIS — I248 Other forms of acute ischemic heart disease: Secondary | ICD-10-CM | POA: Diagnosis not present

## 2018-08-05 DIAGNOSIS — Z79891 Long term (current) use of opiate analgesic: Secondary | ICD-10-CM

## 2018-08-05 DIAGNOSIS — F1010 Alcohol abuse, uncomplicated: Secondary | ICD-10-CM | POA: Diagnosis present

## 2018-08-05 DIAGNOSIS — R188 Other ascites: Secondary | ICD-10-CM | POA: Diagnosis not present

## 2018-08-05 DIAGNOSIS — E1151 Type 2 diabetes mellitus with diabetic peripheral angiopathy without gangrene: Secondary | ICD-10-CM | POA: Diagnosis present

## 2018-08-05 DIAGNOSIS — E871 Hypo-osmolality and hyponatremia: Secondary | ICD-10-CM | POA: Diagnosis not present

## 2018-08-05 DIAGNOSIS — I21A1 Myocardial infarction type 2: Secondary | ICD-10-CM | POA: Diagnosis present

## 2018-08-05 DIAGNOSIS — J9600 Acute respiratory failure, unspecified whether with hypoxia or hypercapnia: Secondary | ICD-10-CM

## 2018-08-05 DIAGNOSIS — Z515 Encounter for palliative care: Secondary | ICD-10-CM | POA: Diagnosis not present

## 2018-08-05 DIAGNOSIS — G9341 Metabolic encephalopathy: Secondary | ICD-10-CM | POA: Diagnosis not present

## 2018-08-05 DIAGNOSIS — Z452 Encounter for adjustment and management of vascular access device: Secondary | ICD-10-CM

## 2018-08-05 DIAGNOSIS — Z20828 Contact with and (suspected) exposure to other viral communicable diseases: Secondary | ICD-10-CM | POA: Diagnosis present

## 2018-08-05 DIAGNOSIS — R0789 Other chest pain: Secondary | ICD-10-CM

## 2018-08-05 DIAGNOSIS — I959 Hypotension, unspecified: Secondary | ICD-10-CM | POA: Diagnosis not present

## 2018-08-05 DIAGNOSIS — Z833 Family history of diabetes mellitus: Secondary | ICD-10-CM

## 2018-08-05 DIAGNOSIS — F14188 Cocaine abuse with other cocaine-induced disorder: Secondary | ICD-10-CM | POA: Diagnosis present

## 2018-08-05 DIAGNOSIS — I4901 Ventricular fibrillation: Secondary | ICD-10-CM | POA: Diagnosis not present

## 2018-08-05 DIAGNOSIS — B1920 Unspecified viral hepatitis C without hepatic coma: Secondary | ICD-10-CM | POA: Diagnosis present

## 2018-08-05 DIAGNOSIS — I441 Atrioventricular block, second degree: Secondary | ICD-10-CM | POA: Diagnosis present

## 2018-08-05 DIAGNOSIS — I25118 Atherosclerotic heart disease of native coronary artery with other forms of angina pectoris: Secondary | ICD-10-CM | POA: Diagnosis present

## 2018-08-05 DIAGNOSIS — R140 Abdominal distension (gaseous): Secondary | ICD-10-CM

## 2018-08-05 DIAGNOSIS — J9601 Acute respiratory failure with hypoxia: Secondary | ICD-10-CM

## 2018-08-05 DIAGNOSIS — R579 Shock, unspecified: Secondary | ICD-10-CM | POA: Diagnosis not present

## 2018-08-05 DIAGNOSIS — E875 Hyperkalemia: Secondary | ICD-10-CM | POA: Diagnosis not present

## 2018-08-05 DIAGNOSIS — E785 Hyperlipidemia, unspecified: Secondary | ICD-10-CM | POA: Diagnosis present

## 2018-08-05 DIAGNOSIS — Z8711 Personal history of peptic ulcer disease: Secondary | ICD-10-CM

## 2018-08-05 DIAGNOSIS — R451 Restlessness and agitation: Secondary | ICD-10-CM | POA: Diagnosis not present

## 2018-08-05 DIAGNOSIS — Z7189 Other specified counseling: Secondary | ICD-10-CM | POA: Diagnosis not present

## 2018-08-05 DIAGNOSIS — I428 Other cardiomyopathies: Secondary | ICD-10-CM | POA: Diagnosis present

## 2018-08-05 DIAGNOSIS — F1410 Cocaine abuse, uncomplicated: Secondary | ICD-10-CM | POA: Diagnosis not present

## 2018-08-05 DIAGNOSIS — Z8249 Family history of ischemic heart disease and other diseases of the circulatory system: Secondary | ICD-10-CM

## 2018-08-05 DIAGNOSIS — R570 Cardiogenic shock: Secondary | ICD-10-CM | POA: Diagnosis present

## 2018-08-05 DIAGNOSIS — G9340 Encephalopathy, unspecified: Secondary | ICD-10-CM | POA: Diagnosis not present

## 2018-08-05 DIAGNOSIS — I484 Atypical atrial flutter: Secondary | ICD-10-CM | POA: Diagnosis present

## 2018-08-05 DIAGNOSIS — I361 Nonrheumatic tricuspid (valve) insufficiency: Secondary | ICD-10-CM | POA: Diagnosis not present

## 2018-08-05 DIAGNOSIS — I495 Sick sinus syndrome: Secondary | ICD-10-CM | POA: Diagnosis present

## 2018-08-05 DIAGNOSIS — I483 Typical atrial flutter: Secondary | ICD-10-CM

## 2018-08-05 DIAGNOSIS — J9621 Acute and chronic respiratory failure with hypoxia: Secondary | ICD-10-CM | POA: Diagnosis present

## 2018-08-05 DIAGNOSIS — I351 Nonrheumatic aortic (valve) insufficiency: Secondary | ICD-10-CM | POA: Diagnosis not present

## 2018-08-05 DIAGNOSIS — I272 Pulmonary hypertension, unspecified: Secondary | ICD-10-CM | POA: Diagnosis present

## 2018-08-05 DIAGNOSIS — R410 Disorientation, unspecified: Secondary | ICD-10-CM | POA: Diagnosis not present

## 2018-08-05 DIAGNOSIS — I255 Ischemic cardiomyopathy: Secondary | ICD-10-CM | POA: Diagnosis present

## 2018-08-05 DIAGNOSIS — I252 Old myocardial infarction: Secondary | ICD-10-CM

## 2018-08-05 DIAGNOSIS — D631 Anemia in chronic kidney disease: Secondary | ICD-10-CM | POA: Diagnosis present

## 2018-08-05 DIAGNOSIS — E11649 Type 2 diabetes mellitus with hypoglycemia without coma: Secondary | ICD-10-CM | POA: Diagnosis not present

## 2018-08-05 DIAGNOSIS — Z79899 Other long term (current) drug therapy: Secondary | ICD-10-CM

## 2018-08-05 DIAGNOSIS — R079 Chest pain, unspecified: Secondary | ICD-10-CM | POA: Diagnosis present

## 2018-08-05 LAB — CBC WITH DIFFERENTIAL/PLATELET
Abs Immature Granulocytes: 0.04 10*3/uL (ref 0.00–0.07)
Basophils Absolute: 0.1 10*3/uL (ref 0.0–0.1)
Basophils Relative: 1 %
Eosinophils Absolute: 0.1 10*3/uL (ref 0.0–0.5)
Eosinophils Relative: 1 %
HCT: 31.6 % — ABNORMAL LOW (ref 39.0–52.0)
Hemoglobin: 10.1 g/dL — ABNORMAL LOW (ref 13.0–17.0)
Immature Granulocytes: 1 %
Lymphocytes Relative: 11 %
Lymphs Abs: 0.8 10*3/uL (ref 0.7–4.0)
MCH: 28.1 pg (ref 26.0–34.0)
MCHC: 32 g/dL (ref 30.0–36.0)
MCV: 87.8 fL (ref 80.0–100.0)
Monocytes Absolute: 1.8 10*3/uL — ABNORMAL HIGH (ref 0.1–1.0)
Monocytes Relative: 24 %
Neutro Abs: 4.5 10*3/uL (ref 1.7–7.7)
Neutrophils Relative %: 62 %
Platelets: 150 10*3/uL (ref 150–400)
RBC: 3.6 MIL/uL — ABNORMAL LOW (ref 4.22–5.81)
RDW: 19.1 % — ABNORMAL HIGH (ref 11.5–15.5)
WBC: 7.3 10*3/uL (ref 4.0–10.5)
nRBC: 0 % (ref 0.0–0.2)

## 2018-08-05 LAB — LACTIC ACID, PLASMA: Lactic Acid, Venous: 0.9 mmol/L (ref 0.5–1.9)

## 2018-08-05 LAB — COMPREHENSIVE METABOLIC PANEL
ALT: 21 U/L (ref 0–44)
AST: 26 U/L (ref 15–41)
Albumin: 3.4 g/dL — ABNORMAL LOW (ref 3.5–5.0)
Alkaline Phosphatase: 81 U/L (ref 38–126)
Anion gap: 13 (ref 5–15)
BUN: 45 mg/dL — ABNORMAL HIGH (ref 8–23)
CO2: 23 mmol/L (ref 22–32)
Calcium: 8.3 mg/dL — ABNORMAL LOW (ref 8.9–10.3)
Chloride: 105 mmol/L (ref 98–111)
Creatinine, Ser: 5.27 mg/dL — ABNORMAL HIGH (ref 0.61–1.24)
GFR calc Af Amer: 12 mL/min — ABNORMAL LOW (ref >60)
GFR calc non Af Amer: 11 mL/min — ABNORMAL LOW (ref >60)
Glucose, Bld: 78 mg/dL (ref 70–99)
Potassium: 4.4 mmol/L (ref 3.5–5.1)
Sodium: 141 mmol/L (ref 135–145)
Total Bilirubin: 1 mg/dL (ref 0.3–1.2)
Total Protein: 7.4 g/dL (ref 6.5–8.1)

## 2018-08-05 LAB — TROPONIN I
Troponin I: 0.13 ng/mL (ref <0.03)
Troponin I: 0.14 ng/mL (ref <0.03)
Troponin I: 0.11 ng/mL (ref <0.03)
Troponin I: 0.11 ng/mL (ref <0.03)

## 2018-08-05 LAB — MRSA PCR SCREENING: MRSA by PCR: NEGATIVE

## 2018-08-05 LAB — TSH: TSH: 2.41 u[IU]/mL (ref 0.350–4.500)

## 2018-08-05 LAB — BRAIN NATRIURETIC PEPTIDE: B Natriuretic Peptide: 2373 pg/mL — ABNORMAL HIGH (ref 0.0–100.0)

## 2018-08-05 LAB — SARS CORONAVIRUS 2 BY RT PCR (HOSPITAL ORDER, PERFORMED IN ~~LOC~~ HOSPITAL LAB): SARS Coronavirus 2: NEGATIVE

## 2018-08-05 LAB — GLUCOSE, CAPILLARY: Glucose-Capillary: 73 mg/dL (ref 70–99)

## 2018-08-05 MED ORDER — ALBUTEROL SULFATE HFA 108 (90 BASE) MCG/ACT IN AERS
2.0000 | INHALATION_SPRAY | Freq: Once | RESPIRATORY_TRACT | Status: AC
  Administered 2018-08-05: 2 via RESPIRATORY_TRACT
  Filled 2018-08-05: qty 6.7

## 2018-08-05 MED ORDER — SODIUM CHLORIDE 0.9 % IV SOLN
2.0000 g | Freq: Once | INTRAVENOUS | Status: AC
  Administered 2018-08-05: 2 g via INTRAVENOUS
  Filled 2018-08-05: qty 2

## 2018-08-05 MED ORDER — ACETAMINOPHEN 325 MG PO TABS
650.0000 mg | ORAL_TABLET | Freq: Once | ORAL | Status: AC
  Administered 2018-08-05: 10:00:00 650 mg via ORAL
  Filled 2018-08-05: qty 2

## 2018-08-05 MED ORDER — SODIUM CHLORIDE 0.9 % IV SOLN
250.0000 mL | INTRAVENOUS | Status: DC | PRN
  Administered 2018-08-10: 250 mL via INTRAVENOUS

## 2018-08-05 MED ORDER — ONDANSETRON HCL 4 MG/2ML IJ SOLN
4.0000 mg | Freq: Four times a day (QID) | INTRAMUSCULAR | Status: DC | PRN
  Administered 2018-08-06: 4 mg via INTRAVENOUS
  Filled 2018-08-05: qty 2

## 2018-08-05 MED ORDER — FAMOTIDINE IN NACL 20-0.9 MG/50ML-% IV SOLN
20.0000 mg | Freq: Once | INTRAVENOUS | Status: AC
  Administered 2018-08-05: 20 mg via INTRAVENOUS
  Filled 2018-08-05: qty 50

## 2018-08-05 MED ORDER — LEVALBUTEROL TARTRATE 45 MCG/ACT IN AERO: 2.0000 | INHALATION_SPRAY | Freq: Once | RESPIRATORY_TRACT | Status: DC

## 2018-08-05 MED ORDER — MORPHINE BOLUS VIA INFUSION: 2.0000 mg | INTRAVENOUS | Status: DC | PRN

## 2018-08-05 MED ORDER — SODIUM CHLORIDE 0.9% FLUSH
3.0000 mL | Freq: Two times a day (BID) | INTRAVENOUS | Status: DC
  Administered 2018-08-05 – 2018-08-11 (×13): 3 mL via INTRAVENOUS

## 2018-08-05 MED ORDER — METRONIDAZOLE IN NACL 5-0.79 MG/ML-% IV SOLN
500.0000 mg | Freq: Once | INTRAVENOUS | Status: AC
  Administered 2018-08-05: 500 mg via INTRAVENOUS
  Filled 2018-08-05: qty 100

## 2018-08-05 MED ORDER — DIGOXIN 0.25 MG/ML IJ SOLN
0.2500 mg | Freq: Once | INTRAMUSCULAR | Status: AC
  Administered 2018-08-05: 0.25 mg via INTRAVENOUS
  Filled 2018-08-05 (×2): qty 2

## 2018-08-05 MED ORDER — BENZOCAINE 20 % MT SOLN
Freq: Once | OROMUCOSAL | Status: AC
  Administered 2018-08-05: 09:00:00 via OROMUCOSAL

## 2018-08-05 MED ORDER — SODIUM CHLORIDE 0.9 % IV SOLN
2.0000 g | INTRAVENOUS | Status: DC
  Filled 2018-08-05 (×2): qty 2

## 2018-08-05 MED ORDER — PANTOPRAZOLE SODIUM 40 MG IV SOLR
40.0000 mg | Freq: Once | INTRAVENOUS | Status: AC
  Administered 2018-08-05: 40 mg via INTRAVENOUS
  Filled 2018-08-05: qty 40

## 2018-08-05 MED ORDER — IPRATROPIUM-ALBUTEROL 0.5-2.5 (3) MG/3ML IN SOLN: 3.0000 mL | RESPIRATORY_TRACT | Status: DC | PRN

## 2018-08-05 MED ORDER — FENTANYL CITRATE (PF) 100 MCG/2ML IJ SOLN
25.0000 ug | Freq: Once | INTRAMUSCULAR | Status: AC
  Administered 2018-08-05: 25 ug via INTRAVENOUS
  Filled 2018-08-05: qty 2

## 2018-08-05 MED ORDER — SODIUM CHLORIDE 0.9% FLUSH: 3.0000 mL | INTRAVENOUS | Status: DC | PRN

## 2018-08-05 MED ORDER — SODIUM CHLORIDE 0.9 % IV BOLUS
500.0000 mL | Freq: Once | INTRAVENOUS | Status: AC
  Administered 2018-08-05: 500 mL via INTRAVENOUS

## 2018-08-05 MED ORDER — IOPAMIDOL (ISOVUE-370) INJECTION 76%
100.0000 mL | Freq: Once | INTRAVENOUS | Status: AC | PRN
  Administered 2018-08-05: 100 mL via INTRAVENOUS

## 2018-08-05 MED ORDER — HEPARIN SODIUM (PORCINE) 5000 UNIT/ML IJ SOLN
5000.0000 [IU] | Freq: Three times a day (TID) | INTRAMUSCULAR | Status: DC
  Administered 2018-08-05 – 2018-08-07 (×5): 5000 [IU] via SUBCUTANEOUS
  Filled 2018-08-05 (×7): qty 1

## 2018-08-05 MED ORDER — VANCOMYCIN HCL IN DEXTROSE 1-5 GM/200ML-% IV SOLN
1000.0000 mg | Freq: Once | INTRAVENOUS | Status: AC
  Administered 2018-08-05: 1000 mg via INTRAVENOUS
  Filled 2018-08-05: qty 200

## 2018-08-05 MED ORDER — SODIUM CHLORIDE 0.9 % IV BOLUS
250.0000 mL | Freq: Once | INTRAVENOUS | Status: AC
  Administered 2018-08-05: 250 mL via INTRAVENOUS

## 2018-08-05 MED ORDER — IOHEXOL 350 MG/ML SOLN
75.0000 mL | Freq: Once | INTRAVENOUS | Status: AC | PRN
  Administered 2018-08-05: 75 mL via INTRAVENOUS

## 2018-08-05 MED ORDER — DIGOXIN 0.25 MG/ML IJ SOLN
0.1250 mg | Freq: Once | INTRAMUSCULAR | Status: DC
  Filled 2018-08-05: qty 2

## 2018-08-05 MED ORDER — ACETAMINOPHEN 650 MG RE SUPP: 650.0000 mg | Freq: Four times a day (QID) | RECTAL | Status: DC | PRN

## 2018-08-05 MED ORDER — BENZONATATE 100 MG PO CAPS
100.0000 mg | ORAL_CAPSULE | Freq: Three times a day (TID) | ORAL | Status: AC
  Administered 2018-08-05 – 2018-08-08 (×8): 100 mg via ORAL
  Filled 2018-08-05 (×7): qty 1

## 2018-08-05 MED ORDER — ACETAMINOPHEN 325 MG PO TABS: 650.0000 mg | ORAL_TABLET | Freq: Four times a day (QID) | ORAL | Status: DC | PRN

## 2018-08-05 MED ORDER — ONDANSETRON HCL 4 MG PO TABS: 4.0000 mg | ORAL_TABLET | Freq: Four times a day (QID) | ORAL | Status: DC | PRN

## 2018-08-05 MED ORDER — ASPIRIN EC 81 MG PO TBEC
81.0000 mg | DELAYED_RELEASE_TABLET | Freq: Every day | ORAL | Status: DC
  Administered 2018-08-05 – 2018-08-09 (×5): 81 mg via ORAL
  Filled 2018-08-05 (×5): qty 1

## 2018-08-05 MED ORDER — ATORVASTATIN CALCIUM 20 MG PO TABS
40.0000 mg | ORAL_TABLET | Freq: Every day | ORAL | Status: DC
  Administered 2018-08-05 – 2018-08-09 (×5): 40 mg via ORAL
  Filled 2018-08-05 (×5): qty 2

## 2018-08-05 MED ORDER — VANCOMYCIN HCL IN DEXTROSE 750-5 MG/150ML-% IV SOLN
750.0000 mg | INTRAVENOUS | Status: DC
  Filled 2018-08-05 (×2): qty 150

## 2018-08-05 MED ORDER — MORPHINE SULFATE (PF) 2 MG/ML IV SOLN
2.0000 mg | INTRAVENOUS | Status: DC | PRN
  Administered 2018-08-05 (×2): 2 mg via INTRAVENOUS
  Administered 2018-08-06: 3 mg via INTRAVENOUS
  Administered 2018-08-06 – 2018-08-07 (×3): 2 mg via INTRAVENOUS
  Filled 2018-08-05: qty 1
  Filled 2018-08-05: qty 2
  Filled 2018-08-05 (×4): qty 1

## 2018-08-05 MED ORDER — VANCOMYCIN HCL IN DEXTROSE 750-5 MG/150ML-% IV SOLN
750.0000 mg | Freq: Once | INTRAVENOUS | Status: AC
  Administered 2018-08-05: 750 mg via INTRAVENOUS
  Filled 2018-08-05: qty 150

## 2018-08-05 NOTE — ED Triage Notes (Signed)
Pt arrived via [facility] with chest pain. Pt had dialysis yesterday and they only took off 3L instead of 5L. Pt is hypotensive. Pt has a flutter, no history. Recieved 324 of asa.

## 2018-08-05 NOTE — Consult Note (Addendum)
Cardiology Consultation:   Patient ID: [name] MRN: [id number]; DOB: [birth year]  Admit date: 08/20/2018 Date of Consult: 08/02/2018  Primary Care Provider: Donnie Coffin, MD Primary Cardiologist: Nelva Bush, MD  Primary Electrophysiologist:  Dr. Olin Pia Physician requesting consult: Dr. Serita Grit   Patient Profile:   [name] is a 63 y.o. male with a hx of ischemic and nonischemic cardiomyopathy, coronary artery disease, chronic combined CHF, end-stage renal disease on hemodialysis, frequent PVCs/bigeminy,Hepatitis C, GI bleed anemia of chronic disease, pulmonary hypertension, hypertensive heart disease, PAD presenting with chest discomfort, shortness of breath, cough, malaise, hypotension    History of Present Illness:   [name] to the hospital with chest discomfort Cough past day or 2 Noted to be hypotensive in the emergency room systolic pressures in the 80s Placed on telemetry, showing atrial flutter , labile rate variable 120 down to 80 bpm Started on broad-spectrum antibiotics for concern of sepsis  Initial troponin 0.13, setting of end-stage renal disease, known coronary artery disease Repeat troponin 0.14 BNP 2300  CT scan chest with no acute findings, atelectasis  Hemodialysis yesterday 2 to 3 L removed  Patient is a poor historian  Other past medical history reviewed  echo in 2016 EF of 30-35%.   Echo in 2018 EF of 20-25%.    In May 2018, he was admitted to [facility]   Catheterization  revealed an occluded right coronary artery with left-to-right collaterals.   medically managed    He has had multiple admissions in the setting of acute pulmonary edema     In December 2018,  frequent PVCs during hospitalization    Holter monitor showed 34,000 PVCs over 24-hour period, accounting for a 34% PVC burden.   placed on amiodarone    stenting of the left subclavian and innominate arteries,  PTA of the left brachial vein, and dialysis  catheter placement on March 15 2017  Stress test 02/2017  There is a large in size, severe, fixed inferior defect consistent with prior infarct. Partially reversible basal and mid inferolateral defect may reflect peri-infarct ischemia.  The left ventricular ejection fraction is severely decreased (<30%) with global hypokinesis and basal/mid inferior akinesis. The left ventricle is dilated.  Recent colonoscopy March 2020 for GI bleed Duodenal ulcer Hemorrhagic shock    Past Medical History:  Diagnosis Date   Arthritis    CAD (coronary artery disease)    a. 09/2013 Myoview [facility]): basal inf defect more pronounced @ rest, likely artifact, EF 48%, low risk study; b. 06/2016 Cath [facility]): LAD 10p, 30-57m, 40-50d, D1 90(small), D2 40, D3 60-70, D4 30, LCX 10p, 73m, 20d, Om1 30, OM3 40, RCA 100p w/ bridging collats, m/d RCA fill via collats, small/mod caliber-->Med Rx; c. 02/2017 MV: inf infarct w/ peri-inf ischemia. EF <30%.    Chronic combined systolic and diastolic CHF (congestive heart failure) ([place])    a. 10/2013 Echo [facility]): EF 50%, diast dysfxn;  b. 10/2014 Echo: EF 30-35%, diff HK, gr1 DD;  c. 04/2016 Echo: EF 20-25%, sev dil LV, diff HK, gr3 DD; d. 06/2016 Echo: EF 30-35%, Gr2 DD.   Diabetes ([facility])    Dyspnea    ESRD (end stage renal disease) ([place])    a. MWF dialysis @ [facility].   Hepatitis    Patient is unsure type of Hepatitis he has   Hypertension    Hypertensive heart disease with CHF (congestive heart failure) ([place])    Mixed Ischemic and non-ischemic cardiomyopathy    a.  10/2013 Echo [facility]): EF 50%, diast dysfxn;  b. 10/2014 Echo: EF 30-35%, diff HK, gr1 DD;  c. 04/2016 Echo: EF 20-25%, sev dil LV, diff HK, gr3 DD; d. 06/2016 Echo: EF 30-35%, Gr2 DD.   Moderate Aortic insufficiency    a. 04/2016 Echo: Mod AI.   Myocardial infarction [facility])    Peripheral vascular disease ([place])    Pulmonary hypertension ([place])    a. 10/2014 Echo: Mod-Sev PAH.   PVC's (premature  ventricular contractions)    a. 02/2017 24 hr holter: freq polymorphic PVCs w/ total of 38K beats in 24 hrs (34% burden).   Renal insufficiency     Past Surgical History:  Procedure Laterality Date   A/V FISTULAGRAM Left 04/06/2016   Procedure: A/V Fistulagram;  Surgeon: Katha Cabal, MD;  Location: [place] CV [facility];  Service: Cardiovascular;  Laterality: Left;   A/V FISTULAGRAM Left 01/19/2017   Procedure: A/V FISTULAGRAM;  Surgeon: Katha Cabal, MD;  Location: [place] [facility];  Service: Cardiovascular;  Laterality: Left;   A/V SHUNT INTERVENTION N/A 04/06/2016   Procedure: A/V Shunt Intervention;  Surgeon: Katha Cabal, MD;  Location: [place] CV LAB;  Service: Cardiovascular;  Laterality: N/A;   A/V SHUNT INTERVENTION N/A 06/29/2016   Procedure: A/V Shunt Intervention;  Surgeon: Katha Cabal, MD;  Location: [place] CV LAB;  Service: Cardiovascular;  Laterality: N/A;   AV FISTULA PLACEMENT Left 2014   AV FISTULA PLACEMENT Left 06/01/2017   Procedure: INSERTION OF ARTERIOVENOUS (AV) GORE-TEX GRAFT ARM ( BRACHIAL AXILLARY );  Surgeon: Katha Cabal, MD;  Location: [facility] ORS;  Service: Vascular;  Laterality: Left;   DIALYSIS/PERMA CATHETER INSERTION Right    Dr. Delana Meyer   DIALYSIS/PERMA CATHETER REMOVAL N/A 07/05/2017   Procedure: DIALYSIS/PERMA CATHETER REMOVAL;  Surgeon: Katha Cabal, MD;  Location: [place] CV [facility];  Service: Cardiovascular;  Laterality: N/A;   EMBOLIZATION N/A 05/09/2018   Procedure: EMBOLIZATION (Duodenal Ulceration);  Surgeon: Katha Cabal, MD;  Location: [place] CV LAB;  Service: Cardiovascular;  Laterality: N/A;   ESOPHAGOGASTRODUODENOSCOPY N/A 05/04/2018   Procedure: ESOPHAGOGASTRODUODENOSCOPY (EGD);  Surgeon: Virgel Manifold, MD;  Location: [facility] ENDOSCOPY;  Service: Gastroenterology;  Laterality: N/A;   ESOPHAGOGASTRODUODENOSCOPY (EGD) WITH PROPOFOL N/A 04/20/2018   Procedure:  ESOPHAGOGASTRODUODENOSCOPY (EGD) WITH PROPOFOL;  Surgeon: Jonathon Bellows, MD;  Location[facility] END[facility]COPY;  Service: Gastroenterology;  Laterality: N/A;   PERIPHERAL VASCULAR CATHETERIZATION N/A 10/03/2014   Procedure: A/V Shuntogram/Fistulagram;  Surgeon: Algernon Huxley, MD;  Location: [place] CV LAB;  Service: Cardiovascular;  Laterality: N/A;   PERIPHERAL VASCULAR CATHETERIZATION Left 10/03/2014   Procedure: A/V Shunt Intervention;  Surgeon: Algernon Huxley, MD;  Location: [place] [facility];  Service: Cardiovascular;  Laterality: Left;   PERIPHERAL VASCULAR CATHETERIZATION Left 05/01/2015   Procedure: A/V Shuntogram/Fistulagram;  Surgeon: Algernon Huxley, MD;  Location: [place] CV LAB;  Service: Cardiovascular;  Laterality: Left;   PERIPHERAL VASCULAR CATHETERIZATION N/A 05/01/2015   Procedure: A/V Shunt Intervention;  Surgeon: Algernon Huxley, MD;  Location: [place] [facility];  Service: Cardiovascular;  Laterality: N/A;   UPPER EXTREMITY ANGIOGRAPHY Bilateral 06/29/2016   Procedure: Upper Extremity Angiography;  Surgeon: Katha Cabal, MD;  Location: [place] [facility];  Service: Cardiovascular;  Laterality: Bilateral;   UPPER EXTREMITY INTERVENTION  06/29/2016   Procedure: Upper Extremity Intervention;  Surgeon: Katha Cabal, MD;  Location: [place] CV LAB;  Service: Cardiovascular;;   UPPER EXTREMITY VENOGRAPHY Left 03/15/2017  Procedure: UPPER EXTREMITY VENOGRAPHY;  Surgeon: Katha Cabal, MD;  Location: [place] CV LAB;  Service: Cardiovascular;  Laterality: Left;     Home Medications:  Prior to Admission medications   Medication Sig Start Date End Date Taking? Authorizing Provider  atorvastatin (LIPITOR) 40 MG tablet Take 40 mg by mouth daily. 07/26/18  Yes [provider]  carvedilol (COREG) 3.125 MG tablet Take 3.125 mg by mouth 2 (two) times a day. 07/27/18  Yes [provider]  ENTRESTO 24-26 MG Take 1 tablet by mouth 2 (two) times a day. 07/26/18   Yes [provider]  furosemide (LASIX) 40 MG tablet Take 40 mg by mouth 2 (two) times a day. 07/26/18  Yes [provider]  hydrALAZINE (APRESOLINE) 100 MG tablet Take 100 mg by mouth daily. 07/26/18  Yes [provider]  isosorbide mononitrate (IMDUR) 30 MG 24 hr tablet Take 30 mg by mouth daily. 07/26/18  Yes [provider]  pantoprazole (PROTONIX) 40 MG tablet Take 40 mg by mouth 2 (two) times a day. 07/26/18  Yes [provider]  ceFEPIme 500 mg in dextrose 5 % 50 mL Inject 500 mg into the vein daily. 05/11/18   Bradly Bienenstock, NP  chlorhexidine (PERIDEX) 0.12 % solution 15 mLs by Mouth Rinse route 2 (two) times daily. 05/10/18   Bradly Bienenstock, NP  Chlorhexidine Gluconate Cloth 2 % PADS Apply 6 each topically daily. 05/10/18   Bradly Bienenstock, NP  epoetin alfa (EPOGEN,PROCRIT) 96222 UNIT/ML injection Inject 0.4 mLs (4,000 Units total) into the vein every Monday, Wednesday, and Friday with hemodialysis. 05/10/18   Darel Hong D, NP  haloperidol lactate (HALDOL) 5 MG/ML injection Inject 0.2 mLs (1 mg total) into the vein every 6 (six) hours as needed (for agitated delirium). 05/10/18   Darel Hong D, NP  ipratropium-albuterol (DUONEB) 0.5-2.5 (3) MG/3ML SOLN Take 3 mLs by nebulization every 4 (four) hours as needed. 05/10/18   Bradly Bienenstock, NP  LORazepam (ATIVAN) 2 MG/ML injection Inject 0.25 mLs (0.5 mg total) into the vein every 4 (four) hours as needed for anxiety. 05/10/18   Darel Hong D, NP  morphine 2 MG/ML injection Inject 0.5 mLs (1 mg total) into the vein every 2 (two) hours as needed (chest pain). 05/10/18   Bradly Bienenstock, NP  mouth rinse LIQD solution 15 mLs by Mouth Rinse route 2 times daily at 12 noon and 4 pm. 05/10/18   Bradly Bienenstock, NP  nitroGLYCERIN (NITROSTAT) 0.4 MG SL tablet Place 1 tablet (0.4 mg total) under the tongue every 5 (five) minutes as needed for chest pain. 05/10/18   Bradly Bienenstock, NP  ondansetron  (ZOFRAN) 4 MG/2ML SOLN injection Inject 2 mLs (4 mg total) into the vein every 6 (six) hours as needed for nausea. 05/10/18   Bradly Bienenstock, NP  ondansetron [facility]) 4 MG/2ML SOLN injection Inject 2 mLs (4 mg total) into the vein every 6 (six) hours as needed for nausea, vomiting or refractory nausea / vomiting. 05/10/18   Bradly Bienenstock, NP  pantoprazole (PROTONIX) 40 MG injection Inject 40 mg into the vein every 12 (twelve) hours. 05/13/18   Darel Hong D, NP  pantoprazole 80 mg in sodium chloride 0.9 % 250 mL Inject 8 mg/hr into the vein continuous. 05/10/18   Darel Hong D, NP  phenylephrine 10 mg in sodium chloride 0.9 % 250 mL Inject 0-400 mcg/min into the vein continuous. 05/10/18   Bradly Bienenstock,  NP  traMADol (ULTRAM) 50 MG tablet Take 1 tablet (50 mg total) by mouth every 6 (six) hours as needed for moderate pain or severe pain. 04/21/18   Nicholes Mango, MD  Vancomycin (VANCOCIN) 750-5 MG/150ML-% SOLN Inject 150 mLs (750 mg total) into the vein every Monday, Wednesday, and Friday with hemodialysis. 05/10/18   Bradly Bienenstock, NP  vitamin C (VITAMIN C) 500 MG tablet Take 1 tablet (500 mg total) by mouth 2 (two) times daily. 05/10/18   Bradly Bienenstock, NP    Inpatient Medications: Scheduled Meds:  digoxin  0.125 mg Intravenous Once   Continuous Infusions:  [START ON 08/07/2018] ceFEPime (MAXIPIME) IV     vancomycin     [START ON 08/07/2018] vancomycin     PRN Meds:   Allergies:    Allergies  Allergen Reactions   Sulfa Antibiotics Shortness Of Breath, Itching and Rash    Social History:   Social History   Socioeconomic History   Marital status: Widowed    Spouse name: Not on file   Number of children: Not on file   Years of education: Not on file   Highest education level: Not on file  Occupational History   Not on file  Social Needs   Financial resource strain: Not on file   Food insecurity    Worry: Not on file    Inability: Not on file    Transportation needs    Medical: Not on file    Non-medical: Not on file  Tobacco Use   Smoking status: Former Smoker    Packs/day: 0.25    Years: 40.00    Pack years: 10.00    Types: Cigarettes    Quit date: 10/12/2016    Years since quitting: 1.8   Smokeless tobacco: Never Used  Substance and Sexual Activity   Alcohol use: No    Alcohol/week: 0.0 standard drinks   Drug use: Yes    Types: Cocaine, Marijuana    Comment: patient states no drugs   Sexual activity: Not Currently  Lifestyle   Physical activity    Days per week: Not on file    Minutes per session: Not on file   Stress: Not on file  Relationships   Social connections    Talks on phone: Not on file    Gets together: Not on file    Attends religious service: Not on file    Active member of club or organization: Not on file    Attends meetings of clubs or organizations: Not on file    Relationship status: Not on file   Intimate partner violence    Fear of current or ex partner: Not on file    Emotionally abused: Not on file    Physically abused: Not on file    Forced sexual activity: Not on file  Other Topics Concern   Not on file  Social History Narrative   Lives at home in [place] by himself.   Independent on ambulation but does not routinely exercise.    Family History:    Family History  Problem Relation Age of Onset   Hypertension Son    Diabetes Son    Hypertension Mother    Diabetes Mother    Diabetes Sister      ROS:  Please see the history of present illness.  Review of Systems  Constitutional: Negative.   Respiratory: Positive for cough.   Cardiovascular: Positive for chest pain.  Gastrointestinal: Negative.   Musculoskeletal: Negative.  Neurological: Negative.   Psychiatric/Behavioral: Negative.   All other systems reviewed and are negative.   Physical Exam/Data:   Vitals:   07/29/2018 1030 08/09/2018 1230 08/01/2018 1249 08/14/2018 1300  BP: (!) 88/53 90/62 90/62   96/69  Pulse:   (!) 112 (!) 113  Resp: 14 (!) 27 (!) 24 (!) 21  Temp:      TempSrc:      SpO2:   98% 99%  Weight:      Height:        Intake/Output Summary (Last 24 hours) at 08/21/2018 1324 Last data filed at 07/24/2018 1207 Gross per 24 hour  Intake 1382.5 ml  Output --  Net 1382.5 ml   Last 3 Weights 08/10/2018 07/28/2018 05/09/2018  Weight (lbs) 171 lb 170 lb 172 lb 9.9 oz  Weight (kg) 77.565 kg 77.111 kg 78.3 kg     Body mass index is 26 kg/m.  General:  Well nourished, well developed, in no acute distress HEENT: normal Lymph: no adenopathy Neck: no JVD Endocrine:  No thryomegaly Vascular: No carotid bruits; FA pulses 2+ bilaterally without bruits  Cardiac:  normal S1, S2; RRR; no murmur  Lungs:  clear to auscultation bilaterally, no wheezing, rhonchi or rales  Abd: soft, nontender, no hepatomegaly  Ext: no edema Musculoskeletal:  No deformities, BUE and BLE strength normal and equal Skin: warm and dry  Neuro:  CNs 2-12 intact, no focal abnormalities noted Psych:  Normal affect   EKG:  The EKG was personally reviewed and demonstrates: Showing atrial flutter rate 120 bpm nonspecific ST abnormality  Telemetry:  Telemetry was personally reviewed and demonstrates: Atrial flutter  Relevant CV Studies:   Laboratory Data:  Chemistry Recent Labs  Lab 07/25/2018 0710  NA 141  K 4.4  CL 105  CO2 23  GLUCOSE 78  BUN 45*  CREATININE 5.27*  CALCIUM 8.3*  GFRNONAA 11*  GFRAA 12*  ANIONGAP 13    Recent Labs  Lab 08/12/2018 0710  PROT 7.4  ALBUMIN 3.4*  AST 26  ALT 21  ALKPHOS 81  BILITOT 1.0   Hematology Recent Labs  Lab 08/09/2018 0710  WBC 7.3  RBC 3.60*  HGB 10.1*  HCT 31.6*  MCV 87.8  MCH 28.1  MCHC 32.0  RDW 19.1*  PLT 150   Cardiac Enzymes Recent Labs  Lab 08/22/2018 0710 08/18/2018 1157  TROPONINI 0.13* 0.14*   No results for input(s): TROPIPOC in the last 168 hours.  BNP Recent Labs  Lab 07/31/2018 0710  BNP 2,373.0*    DDimer No  results for input(s): DDIMER in the last 168 hours.  Radiology/Studies:  Dg Chest 2 View  Result Date: 08/04/2018 CLINICAL DATA:  New cough and shortness of breath EXAM: CHEST - 2 VIEW COMPARISON:  May 10, 2018 FINDINGS: Vascular stent projects over the left subclavian region. Stable cardiomegaly. The hila, mediastinum, lungs, and pleura are normal. IMPRESSION: Persistent cardiomegaly.  No other abnormalities. Electronically Signed   By: Dorise Bullion III M.D   On: 08/04/2018 08:00   Ct Angio Chest Pe W And/or Wo Contrast  Result Date: 08/20/2018 CLINICAL DATA:  Chest pain. EXAM: CT ANGIOGRAPHY CHEST WITH CONTRAST TECHNIQUE: Multidetector CT imaging of the chest was performed using the standard protocol during bolus administration of intravenous contrast. Multiplanar CT image reconstructions and MIPs were obtained to evaluate the vascular anatomy. CONTRAST:  55mL OMNIPAQUE IOHEXOL 350 MG/ML SOLN COMPARISON:  Chest x-ray August 05, 2018.  Chest CT Jul 04, 2016. FINDINGS: Cardiovascular: Cardiomegaly is  noted. A stent is seen in the left brachiocephalic vein. Patency cannot be evaluated due to lack of contrast in these vessels due to a right-sided injection. Atherosclerotic changes are seen in the aorta. The ascending thoracic aorta measures up to 4.4 cm, unchanged since May of 2018. No dissection identified. The main pulmonary artery measures 3.7 cm. No pulmonary emboli. Mediastinum/Nodes: The thyroid and esophagus are normal. No adenopathy. Tiny pleural effusions are noted. No pericardial effusion. Lungs/Pleura: Left retrocardiac atelectasis is noted. No other suspicious infiltrates. Central airways are normal. No pneumothorax. Upper Abdomen: No acute abnormality. Musculoskeletal: No chest wall abnormality. No acute or significant osseous findings. Review of the MIP images confirms the above findings. IMPRESSION: 1. No pulmonary emboli identified. 2. Cardiomegaly.  Tiny pleural effusions. 3. There is a  stent in the left brachiocephalic vein. Patency cannot be evaluated due to a right-sided injection. 4. Atherosclerotic changes in the aorta. 5. 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 Recommend annual imaging followup by CTA or MRA. This recommendation follows 2010 ACCF/AHA/AATS/ACR/ASA/SCA/SCAI/SIR/STS/SVM Guidelines for the Diagnosis and Management of Patients with Thoracic Aortic Disease. Circulation. 2010; 121: R740-C144. Aortic aneurysm NOS (ICD10-I71.9) 6. The main pulmonary artery measures 3.7 cm which raises the possibility of pulmonary arterial hypertension. 7. Mild left basilar atelectasis in the retrocardiac region. Aortic Atherosclerosis (ICD10-I70.0). Electronically Signed   By: Dorise Bullion III M.D   On: 07/31/2018 10:29   Ct Angio Chest/abd/pel For Dissection W And/or Wo Contrast  Result Date: 08/19/2018 CLINICAL DATA:  Chest pain for 4 days.  Cough. EXAM: CT ANGIOGRAPHY CHEST, ABDOMEN AND PELVIS TECHNIQUE: Multidetector CT imaging through the chest, abdomen and pelvis was performed using the standard protocol during bolus administration of intravenous contrast. Multiplanar reconstructed images and MIPs were obtained and reviewed to evaluate the vascular anatomy. CONTRAST:  118mL ISOVUE-370 IOPAMIDOL (ISOVUE-370) INJECTION 76% COMPARISON:  CT of the chest August 05, 2018. CT of the abdomen and pelvis May 04, 2018 FINDINGS: CTA CHEST FINDINGS Cardiovascular: Cardiomegaly. Coronary artery calcifications identified. The pulmonary arteries were better assessed on the recent CT a of the chest for PE. No pulmonary emboli in the central pulmonary arteries. The patient had a normal PE study earlier today as well. Atherosclerotic changes are seen in the thoracic aorta without dissection. The ascending thoracic aorta measures 4.2 cm on today's study, mildly aneurysmal. Mediastinum/Nodes: There is a stent in the left brachiocephalic vein which is well opacified  on this study. The thyroid and esophagus are normal. A borderline node anterior to the trachea measuring 14 mm on axial image 40 is stable since 2018, likely reactive. No other evidence of adenopathy identified. Small pleural effusions. No pericardial effusion. Mild increased attenuation in the subcutaneous fat suggests volume overload. Lungs/Pleura: Tiny pleural effusions. Central airways are normal. Mild atelectasis in the posterior left upper lobe. Mild atelectasis in the retrocardiac region, stable. No pneumothorax. Central airways are unchanged no suspicious pulmonary nodules, masses, or focal infiltrates. Musculoskeletal: There is a healed anterior left rib fracture. No acute bony abnormalities. Review of the MIP images confirms the above findings. CTA ABDOMEN AND PELVIS FINDINGS VASCULAR Aorta: Atherosclerotic change is seen in the nonaneurysmal abdominal aorta, extending into the iliac and femoral vessels. No dissection. Celiac: Patent without evidence of aneurysm, dissection, vasculitis or significant stenosis. SMA: Atherosclerotic changes near the origin of the SMA without significant stenosis. No other abnormalities. Renals: Atherosclerosis at the origin of the bilateral renal arteries is identified. IMA: Patent without evidence  of aneurysm, dissection, vasculitis or significant stenosis. Inflow: Patent without evidence of aneurysm, dissection, vasculitis or significant stenosis. Veins: No obvious venous abnormality within the limitations of this arterial phase study. Review of the MIP images confirms the above findings. NON-VASCULAR Hepatobiliary: No focal liver abnormality is seen. No gallstones, gallbladder wall thickening, or biliary dilatation. Pancreas: Unremarkable. No pancreatic ductal dilatation or surrounding inflammatory changes. Spleen: Normal in size without focal abnormality. Adrenals/Urinary Tract: Adrenal glands are normal. The kidneys are atrophic consistent with renal disease. No  hydronephrosis or acute perinephric stranding. The ureters are normal. The bladder is poorly distended but unremarkable. Stomach/Bowel: The stomach and small bowel are unremarkable. The colon is normal. The appendix is unremarkable. Lymphatic: No significant vascular findings are present. No enlarged abdominal or pelvic lymph nodes. Reproductive: Prostate is unremarkable. Other: There is increased attenuation in the subcutaneous fat and in the intra-abdominal fat consistent volume overload. There is a small amount of fluid in the right pericolic gutter. Some of this fluid is adjacent to the appendix but there is no convincing evidence of appendicitis. Musculoskeletal: No acute or significant osseous findings. Review of the MIP images confirms the above findings. IMPRESSION: 1. 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 This is stable since 2018. No dissection. Atherosclerotic changes noted. Recommend annual imaging followup by CTA or MRA. This recommendation follows 2010 ACCF/AHA/AATS/ACR/ASA/SCA/SCAI/SIR/STS/SVM Guidelines for the Diagnosis and Management of Patients with Thoracic Aortic Disease. Circulation. 2010; 121: T017-B939. Aortic aneurysm NOS (ICD10-I71.9) 2. Cardiomegaly.  Coronary artery calcifications. 3. Increased attenuation in the subcutaneous fat diffusely as well as in the intra-abdominal fat with a small amount of fluid in the right pericolic gutter. These findings are most consistent with volume overload. 4. Tiny bilateral pleural effusions. Electronically Signed   By: Dorise Bullion III M.D   On: 08/16/2018 11:36    Assessment and Plan:   1. Atrial flutter with RVR Timing of onset unclear Not a good candidate for anticoagulation given prior history of GI bleeding [facility] notes reviewed detailing: UGIB secondary to duodenal ulcer now s/p cauterization who had a hospital course c/b PEA arrest in the setting of hyperkalemia and hemorrhage. -Rate  control with digoxin Cardioversion only if unstable  2.  History of GI bleed GI bleed February 2020 Duodenal ulcer on EGD, Return to hospital March 2020 with bleeding same duodenal ulcer visualized with clips and cauterization performed Decompensated May 09, 2018 empiric embolization Hospitalized again May 11, 2018 left AMA return to May 15, 2018 rectal bleeding Hypotension and drop in hemoglobin leading to PEA arrest return of spontaneous circulation Work-up hemorrhage in the stomach, small bowel, colon Treated with PPI infusion Had repeat EGD duodenal ulcer again visualized Required pressors, hemoglobin down to 6.9 Transitioned to oral PPI twice daily --- In light of the above would not start heparin for atrial flutter  3) elevated troponin Minimal elevation, non-trending in the setting of renal failure Few options, hold heparin, blood pressure low unable to treat with nitrates or pain medication  4) CAD with stable angina Known coronary artery disease, details above Troponin non-trending No plan for ischemic work-up at this time Reports using cocaine  5) hypotension Consider placement of A-line if question of accuracy of blood pressure -Unable to measure blood pressures left arm given AV fistula Appears to be relatively asymptomatic May need low-dose pressors  6) hx of cocaine abuse Reports recent cocaine use Likely contributing to chest pain, coronary spasm   Total encounter  time more than 110 minutes  Greater than 50% was spent in counseling and coordination of care with the patient   For questions or updates, please contact [facility] Please consult [email or web address] for contact info under     Signed, Ida Rogue, MD  07/25/2018 1:24 PM

## 2018-08-05 NOTE — ED Notes (Signed)
ED TO INPATIENT HANDOFF REPORT  ED Nurse Name and Phone #: Lorrie 50  S Name/Age/Gender [name] 63 y.o. male Room/Bed: ED11A/ED11A  Code Status   Code Status: Prior  Home/SNF/Other Home Patient oriented to: situation Is this baseline? Yes   Triage Complete: Triage complete  Chief Complaint chest pain  Triage Note Pt arrived via [facility] with chest pain. Pt had dialysis yesterday and they only took off 3L instead of 5L. Pt is hypotensive. Pt has a flutter, no history. Recieved 324 of asa.    Allergies Allergies  Allergen Reactions  . Sulfa Antibiotics Shortness Of Breath, Itching and Rash    Level of Care/Admitting Diagnosis ED Disposition    ED Disposition Condition [facility]: [place] [[id number]]  Level of Care: Stepdown [14]  Covid Evaluation: Confirmed COVID Negative  Diagnosis: Hypotension [[id number]]  Admitting Physician: Dustin Flock [[id number]]  Attending Physician: Dustin Flock [[id number]]  Estimated length of stay: past midnight tomorrow  Certification:: I certify this patient will need inpatient services for at least 2 midnights  PT Class (Do Not Modify): Inpatient [101]  PT Acc Code (Do Not Modify): Private [1]       B Medical/Surgery History Past Medical History:  Diagnosis Date  . Arthritis   . CAD (coronary artery disease)    a. 09/2013 Myoview [facility]): basal inf defect more pronounced @ rest, likely artifact, EF 48%, low risk study; b. 06/2016 Cath [facility]): LAD 10p, 30-35m, 40-50d, D1 90(small), D2 40, D3 60-70, D4 30, LCX 10p, 2m, 20d, Om1 30, OM3 40, RCA 100p w/ bridging collats, m/d RCA fill via collats, small/mod caliber-->Med Rx; c. 02/2017 MV: inf infarct w/ peri-inf ischemia. EF <30%.   . Chronic combined systolic and diastolic CHF (congestive heart failure) ([place])    a. 10/2013 Echo [facility]): EF 50%, diast dysfxn;  b. 10/2014 Echo: EF 30-35%, diff HK, gr1 DD;  c. 04/2016 Echo: EF 20-25%, sev dil LV, diff  HK, gr3 DD; d. 06/2016 Echo: EF 30-35%, Gr2 DD.  . Diabetes ([place])   . Dyspnea   . ESRD (end stage renal disease) ([place])    a. MWF dialysis @ [facility].  . Hepatitis    Patient is unsure type of Hepatitis he has  . Hypertension   . Hypertensive heart disease with CHF (congestive heart failure) ([place])   . Mixed Ischemic and non-ischemic cardiomyopathy    a. 10/2013 Echo [facility]): EF 50%, diast dysfxn;  b. 10/2014 Echo: EF 30-35%, diff HK, gr1 DD;  c. 04/2016 Echo: EF 20-25%, sev dil LV, diff HK, gr3 DD; d. 06/2016 Echo: EF 30-35%, Gr2 DD.  [name] Moderate Aortic insufficiency    a. 04/2016 Echo: Mod AI.  [name] Myocardial infarction ([place])   . Peripheral vascular disease ([place])   . Pulmonary hypertension ([place])    a. 10/2014 Echo: Mod-Sev PAH.  [name] PVC's (premature ventricular contractions)    a. 02/2017 24 hr holter: freq polymorphic PVCs w/ total of 38K beats in 24 hrs (34% burden).  . Renal insufficiency    Past Surgical History:  Procedure Laterality Date  . A/V FISTULAGRAM Left 04/06/2016   Procedure: A/V Fistulagram;  Surgeon: Katha Cabal, MD;  Location: [place] [facility];  Service: Cardiovascular;  Laterality: Left;  . A/V FISTULAGRAM Left 01/19/2017   Procedure: A/V FISTULAGRAM;  Surgeon: Katha Cabal, MD;  Location: [place] [facility];  Service: Cardiovascular;  Laterality: Left;  . A/V SHUNT INTERVENTION N/A 04/06/2016  Procedure: A/V Shunt Intervention;  Surgeon: Katha Cabal, MD;  Location: [place] [facility];  Service: Cardiovascular;  Laterality: N/A;  . A/V SHUNT INTERVENTION N/A 06/29/2016   Procedure: A/V Shunt Intervention;  Surgeon: Katha Cabal, MD;  Location: [place] [facility];  Service: Cardiovascular;  Laterality: N/A;  . AV FISTULA PLACEMENT Left 2014  . AV FISTULA PLACEMENT Left 06/01/2017   Procedure: INSERTION OF ARTERIOVENOUS (AV) GORE-TEX GRAFT ARM ( BRACHIAL AXILLARY );  Surgeon: Katha Cabal, MD;  Location: [facility] ORS;  Service: Vascular;   Laterality: Left;  . DIALYSIS/PERMA CATHETER INSERTION Right    Dr. Delana Meyer  . DIALYSIS/PERMA CATHETER REMOVAL N/A 07/05/2017   Procedure: DIALYSIS/PERMA CATHETER REMOVAL;  Surgeon: Katha Cabal, MD;  Location: [place] [facility];  Service: Cardiovascular;  Laterality: N/A;  . EMBOLIZATION N/A 05/09/2018   Procedure: EMBOLIZATION (Duodenal Ulceration);  Surgeon: Katha Cabal, MD;  Location: [place] [facility];  Service: Cardiovascular;  Laterality: N/A;  . ESOPHAGOGASTRODUODENOSCOPY N/A 05/04/2018   Procedure: ESOPHAGOGASTRODUODENOSCOPY (EGD);  Surgeon: Virgel Manifold, MD;  Location: [facility] ENDOSCOPY;  Service: Gastroenterology;  Laterality: N/A;  . ESOPHAGOGASTRODUODENOSCOPY (EGD) WITH PROPOFOL N/A 04/20/2018   Procedure: ESOPHAGOGASTRODUODENOSCOPY (EGD) WITH PROPOFOL;  Surgeon: Jonathon Bellows, MD;  Location: [facility] END[facility]COPY;  Service: Gastroenterology;  Laterality: N/A;  . PERIPHERAL VASCULAR CATHETERIZATION N/A 10/03/2014   Procedure: A/V Shuntogram/Fistulagram;  Surgeon: Algernon Huxley, MD;  Location: [place] [facility];  Service: Cardiovascular;  Laterality: N/A;  . PERIPHERAL VASCULAR CATHETERIZATION Left 10/03/2014   Procedure: A/V Shunt Intervention;  Surgeon: Algernon Huxley, MD;  Location: [place] [facility];  Service: Cardiovascular;  Laterality: Left;  . PERIPHERAL VASCULAR CATHETERIZATION Left 05/01/2015   Procedure: A/V Shuntogram/Fistulagram;  Surgeon: Algernon Huxley, MD;  Location: [place] [facility];  Service: Cardiovascular;  Laterality: Left;  . PERIPHERAL VASCULAR CATHETERIZATION N/A 05/01/2015   Procedure: A/V Shunt Intervention;  Surgeon: Algernon Huxley, MD;  Location: [place] [facility];  Service: Cardiovascular;  Laterality: N/A;  . UPPER EXTREMITY ANGIOGRAPHY Bilateral 06/29/2016   Procedure: Upper Extremity Angiography;  Surgeon: Katha Cabal, MD;  Location: [place] [facility];  Service: Cardiovascular;  Laterality: Bilateral;  . UPPER EXTREMITY INTERVENTION   06/29/2016   Procedure: Upper Extremity Intervention;  Surgeon: Katha Cabal, MD;  Location: [place] [facility];  Service: Cardiovascular;;  . UPPER EXTREMITY VENOGRAPHY Left 03/15/2017   Procedure: UPPER EXTREMITY VENOGRAPHY;  Surgeon: Katha Cabal, MD;  Location: [place] [facility];  Service: Cardiovascular;  Laterality: Left;     A IV Location/Drains/Wounds Patient Lines/Drains/Airways Status   Active Line/Drains/Airways    Name:   Placement date:   Placement time:   Site:   Days:   Peripheral IV 05/03/18 Right Arm   05/03/18    2100    Arm   94   Peripheral IV 05/09/18 Right Hand   05/09/18    1400    Hand   88   Peripheral IV 08/03/2018 Right Forearm   08/10/2018    0745    Forearm   less than 1   Peripheral IV 08/22/2018 Right;Distal Forearm   08/21/2018    0655    Forearm   less than 1   Peripheral IV 07/31/2018 Right Antecubital   08/10/2018    0757    Antecubital   less than 1   CVC Triple Lumen 05/10/18 Left Femoral   05/10/18    0600  87   Fistula / Graft Left Forearm Arteriovenous fistula   09/20/16    -    Forearm   684   Fistula / Graft Left Upper arm Arteriovenous vein graft   06/01/17    0832    Upper arm   430          Intake/Output Last 24 hours  Intake/Output Summary (Last 24 hours) at 08/06/2018 1250 Last data filed at 08/14/2018 1207 Gross per 24 hour  Intake 1382.5 ml  Output -  Net 1382.5 ml    Labs/Imaging Results for orders placed or performed during the hospital encounter of 08/17/2018 (from the past 48 hour(s))  Troponin I - ONCE - STAT     Status: Abnormal   Collection Time: 08/11/2018  7:10 AM  Result Value Ref Range   Troponin I 0.13 (HH) <0.03 ng/mL    Comment: CRITICAL RESULT CALLED TO, READ BACK BY AND VERIFIED WITH  LORI LEMONS AT 1031 08/12/2018 SDR Performed at [facility], [address] [place], [place] [address]   CBC with Differential     Status: Abnormal   Collection Time: 08/19/2018  7:10 AM  Result Value Ref Range   WBC  7.3 4.0 - 10.5 K/uL   RBC 3.60 (L) 4.22 - 5.81 MIL/uL   Hemoglobin 10.1 (L) 13.0 - 17.0 g/dL   HCT 31.6 (L) 39.0 - 52.0 %   MCV 87.8 80.0 - 100.0 fL   MCH 28.1 26.0 - 34.0 pg   MCHC 32.0 30.0 - 36.0 g/dL   RDW 19.1 (H) 11.5 - 15.5 %   Platelets 150 150 - 400 K/uL   nRBC 0.0 0.0 - 0.2 %   Neutrophils Relative % 62 %   Neutro Abs 4.5 1.7 - 7.7 K/uL   Lymphocytes Relative 11 %   Lymphs Abs 0.8 0.7 - 4.0 K/uL   Monocytes Relative 24 %   Monocytes Absolute 1.8 (H) 0.1 - 1.0 K/uL   Eosinophils Relative 1 %   Eosinophils Absolute 0.1 0.0 - 0.5 K/uL   Basophils Relative 1 %   Basophils Absolute 0.1 0.0 - 0.1 K/uL   Immature Granulocytes 1 %   Abs Immature Granulocytes 0.04 0.00 - 0.07 K/uL    Comment: Performed at [facility], [address] [place], [place] [address]  Comprehensive metabolic panel     Status: Abnormal   Collection Time: 07/27/2018  7:10 AM  Result Value Ref Range   Sodium 141 135 - 145 mmol/L   Potassium 4.4 3.5 - 5.1 mmol/L   Chloride 105 98 - 111 mmol/L   CO2 23 22 - 32 mmol/L   Glucose, Bld 78 70 - 99 mg/dL   BUN 45 (H) 8 - 23 mg/dL   Creatinine, Ser 5.27 (H) 0.61 - 1.24 mg/dL   Calcium 8.3 (L) 8.9 - 10.3 mg/dL   Total Protein 7.4 6.5 - 8.1 g/dL   Albumin 3.4 (L) 3.5 - 5.0 g/dL   AST 26 15 - 41 U/L   ALT 21 0 - 44 U/L   Alkaline Phosphatase 81 38 - 126 U/L   Total Bilirubin 1.0 0.3 - 1.2 mg/dL   GFR calc non Af Amer 11 (L) >60 mL/min   GFR calc Af Amer 12 (L) >60 mL/min   Anion gap 13 5 - 15    Comment: Performed at [facility], [address], [place], [place] [address]  Brain natriuretic peptide     Status: Abnormal  Collection Time: 07/31/2018  7:10 AM  Result Value Ref Range   B Natriuretic Peptide 2,373.0 (H) 0.0 - 100.0 pg/mL    Comment: Performed at [facility], [address] [place], [place] [address]  Lactic acid, plasma     Status: None   Collection Time: 08/07/2018  7:46 AM  Result Value Ref Range   Lactic  Acid, Venous 0.9 0.5 - 1.9 mmol/L    Comment: Performed at [facility], [address] [place], [place] [address]  SARS Coronavirus 2 (CEPHEID- Performed in [place] hospital lab), Hosp Order     Status: None   Collection Time: 08/20/2018  8:34 AM   Specimen: Nasopharyngeal Swab  Result Value Ref Range   SARS Coronavirus 2 NEGATIVE NEGATIVE    Comment: (NOTE) If result is NEGATIVE SARS-CoV-2 target nucleic acids are NOT DETECTED. The SARS-CoV-2 RNA is generally detectable in upper and lower  respiratory specimens during the acute phase of infection. The lowest  concentration of SARS-CoV-2 viral copies this assay can detect is 250  copies / mL. A negative result does not preclude SARS-CoV-2 infection  and should not be used as the sole basis for treatment or other  patient management decisions.  A negative result may occur with  improper specimen collection / handling, submission of specimen other  than nasopharyngeal swab, presence of viral mutation(s) within the  areas targeted by this assay, and inadequate number of viral copies  (<250 copies / mL). A negative result must be combined with clinical  observations, patient history, and epidemiological information. If result is POSITIVE SARS-CoV-2 target nucleic acids are DETECTED. The SARS-CoV-2 RNA is generally detectable in upper and lower  respiratory specimens dur ing the acute phase of infection.  Positive  results are indicative of active infection with SARS-CoV-2.  Clinical  correlation with patient history and other diagnostic information is  necessary to determine patient infection status.  Positive results do  not rule out bacterial infection or co-infection with other viruses. If result is PRESUMPTIVE POSTIVE SARS-CoV-2 nucleic acids MAY BE PRESENT.   A presumptive positive result was obtained on the submitted specimen  and confirmed on repeat testing.  While 2019 novel coronavirus  (SARS-CoV-2) nucleic acids  may be present in the submitted sample  additional confirmatory testing may be necessary for epidemiological  and / or clinical management purposes  to differentiate between  SARS-CoV-2 and other Sarbecovirus currently known to infect humans.  If clinically indicated additional testing with an alternate test  methodology [phone]) is advised. The SARS-CoV-2 RNA is generally  detectable in upper and lower respiratory sp ecimens during the acute  phase of infection. The expected result is Negative. Fact Sheet for Patients:  StrictlyIdeas.no Fact Sheet for Healthcare Providers: BankingDealers.co.za This test is not yet approved or cleared by the [place] FDA and has been authorized for detection and/or diagnosis of SARS-CoV-2 by FDA under an Emergency Use Authorization (EUA).  This EUA will remain in effect (meaning this test can be used) for the duration of the COVID-19 declaration under Section 564(b)(1) of the Act, 21 U.S.C. section 360bbb-3(b)(1), unless the authorization is terminated or revoked sooner. Performed at [facility], [address] [place], [place] [address]   Troponin I - ONCE - STAT     Status: Abnormal   Collection Time: 08/07/2018 11:57 AM  Result Value Ref Range   Troponin I 0.14 (HH) <0.03 ng/mL    Comment: CRITICAL VALUE NOTED. VALUE IS CONSISTENT WITH  PREVIOUSLY REPORTED/CALLED VALUE  SDR Performed at [facility], [address] [place], [place] [address]    Dg Chest 2 View  Result Date: 08/07/2018 CLINICAL DATA:  New cough and shortness of breath EXAM: CHEST - 2 VIEW COMPARISON:  May 10, 2018 FINDINGS: Vascular stent projects over the left subclavian region. Stable cardiomegaly. The hila, mediastinum, lungs, and pleura are normal. IMPRESSION: Persistent cardiomegaly.  No other abnormalities. Electronically Signed   By: Dorise Bullion III M.D   On: 08/04/2018 08:00   Ct Angio Chest Pe  W And/or Wo Contrast  Result Date: 08/18/2018 CLINICAL DATA:  Chest pain. EXAM: CT ANGIOGRAPHY CHEST WITH CONTRAST TECHNIQUE: Multidetector CT imaging of the chest was performed using the standard protocol during bolus administration of intravenous contrast. Multiplanar CT image reconstructions and MIPs were obtained to evaluate the vascular anatomy. CONTRAST:  102mL OMNIPAQUE IOHEXOL 350 MG/ML SOLN COMPARISON:  Chest x-ray August 05, 2018.  Chest CT Jul 04, 2016. FINDINGS: Cardiovascular: Cardiomegaly is noted. A stent is seen in the left brachiocephalic vein. Patency cannot be evaluated due to lack of contrast in these vessels due to a right-sided injection. Atherosclerotic changes are seen in the aorta. The ascending thoracic aorta measures up to 4.4 cm, unchanged since May of 2018. No dissection identified. The main pulmonary artery measures 3.7 cm. No pulmonary emboli. Mediastinum/Nodes: The thyroid and esophagus are normal. No adenopathy. Tiny pleural effusions are noted. No pericardial effusion. Lungs/Pleura: Left retrocardiac atelectasis is noted. No other suspicious infiltrates. Central airways are normal. No pneumothorax. Upper Abdomen: No acute abnormality. Musculoskeletal: No chest wall abnormality. No acute or significant osseous findings. Review of the MIP images confirms the above findings. IMPRESSION: 1. No pulmonary emboli identified. 2. Cardiomegaly.  Tiny pleural effusions. 3. There is a stent in the left brachiocephalic vein. Patency cannot be evaluated due to a right-sided injection. 4. Atherosclerotic changes in the aorta. 5. 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 Recommend annual imaging followup by CTA or MRA. This recommendation follows 2010 ACCF/AHA/AATS/ACR/ASA/SCA/SCAI/SIR/STS/SVM Guidelines for the Diagnosis and Management of Patients with Thoracic Aortic Disease. Circulation. 2010; 121: J500-X381. Aortic aneurysm NOS (ICD10-I71.9) 6. The main  pulmonary artery measures 3.7 cm which raises the possibility of pulmonary arterial hypertension. 7. Mild left basilar atelectasis in the retrocardiac region. Aortic Atherosclerosis (ICD10-I70.0). Electronically Signed   By: Dorise Bullion III M.D   On: 08/20/2018 10:29   Ct Angio Chest/abd/pel For Dissection W And/or Wo Contrast  Result Date: 08/19/2018 CLINICAL DATA:  Chest pain for 4 days.  Cough. EXAM: CT ANGIOGRAPHY CHEST, ABDOMEN AND PELVIS TECHNIQUE: Multidetector CT imaging through the chest, abdomen and pelvis was performed using the standard protocol during bolus administration of intravenous contrast. Multiplanar reconstructed images and MIPs were obtained and reviewed to evaluate the vascular anatomy. CONTRAST:  123mL ISOVUE-370 IOPAMIDOL (ISOVUE-370) INJECTION 76% COMPARISON:  CT of the chest August 05, 2018. CT of the abdomen and pelvis May 04, 2018 FINDINGS: CTA CHEST FINDINGS Cardiovascular: Cardiomegaly. Coronary artery calcifications identified. The pulmonary arteries were better assessed on the recent CT a of the chest for PE. No pulmonary emboli in the central pulmonary arteries. The patient had a normal PE study earlier today as well. Atherosclerotic changes are seen in the thoracic aorta without dissection. The ascending thoracic aorta measures 4.2 cm on today's study, mildly aneurysmal. Mediastinum/Nodes: There is a stent in the left brachiocephalic vein which is well opacified on this study. The thyroid and esophagus  are normal. A borderline node anterior to the trachea measuring 14 mm on axial image 40 is stable since 2018, likely reactive. No other evidence of adenopathy identified. Small pleural effusions. No pericardial effusion. Mild increased attenuation in the subcutaneous fat suggests volume overload. Lungs/Pleura: Tiny pleural effusions. Central airways are normal. Mild atelectasis in the posterior left upper lobe. Mild atelectasis in the retrocardiac region, stable. No  pneumothorax. Central airways are unchanged no suspicious pulmonary nodules, masses, or focal infiltrates. Musculoskeletal: There is a healed anterior left rib fracture. No acute bony abnormalities. Review of the MIP images confirms the above findings. CTA ABDOMEN AND PELVIS FINDINGS VASCULAR Aorta: Atherosclerotic change is seen in the nonaneurysmal abdominal aorta, extending into the iliac and femoral vessels. No dissection. Celiac: Patent without evidence of aneurysm, dissection, vasculitis or significant stenosis. SMA: Atherosclerotic changes near the origin of the SMA without significant stenosis. No other abnormalities. Renals: Atherosclerosis at the origin of the bilateral renal arteries is identified. IMA: Patent without evidence of aneurysm, dissection, vasculitis or significant stenosis. Inflow: Patent without evidence of aneurysm, dissection, vasculitis or significant stenosis. Veins: No obvious venous abnormality within the limitations of this arterial phase study. Review of the MIP images confirms the above findings. NON-VASCULAR Hepatobiliary: No focal liver abnormality is seen. No gallstones, gallbladder wall thickening, or biliary dilatation. Pancreas: Unremarkable. No pancreatic ductal dilatation or surrounding inflammatory changes. Spleen: Normal in size without focal abnormality. Adrenals/Urinary Tract: Adrenal glands are normal. The kidneys are atrophic consistent with renal disease. No hydronephrosis or acute perinephric stranding. The ureters are normal. The bladder is poorly distended but unremarkable. Stomach/Bowel: The stomach and small bowel are unremarkable. The colon is normal. The appendix is unremarkable. Lymphatic: No significant vascular findings are present. No enlarged abdominal or pelvic lymph nodes. Reproductive: Prostate is unremarkable. Other: There is increased attenuation in the subcutaneous fat and in the intra-abdominal fat consistent volume overload. There is a small amount  of fluid in the right pericolic gutter. Some of this fluid is adjacent to the appendix but there is no convincing evidence of appendicitis. Musculoskeletal: No acute or significant osseous findings. Review of the MIP images confirms the above findings. IMPRESSION: 1. 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 This is stable since 2018. No dissection. Atherosclerotic changes noted. Recommend annual imaging followup by CTA or MRA. This recommendation follows 2010 ACCF/AHA/AATS/ACR/ASA/SCA/SCAI/SIR/STS/SVM Guidelines for the Diagnosis and Management of Patients with Thoracic Aortic Disease. Circulation. 2010; 121: V761-Y073. Aortic aneurysm NOS (ICD10-I71.9) 2. Cardiomegaly.  Coronary artery calcifications. 3. Increased attenuation in the subcutaneous fat diffusely as well as in the intra-abdominal fat with a small amount of fluid in the right pericolic gutter. These findings are most consistent with volume overload. 4. Tiny bilateral pleural effusions. Electronically Signed   By: Dorise Bullion III M.D   On: 08/21/2018 11:36    Pending Labs Unresulted Labs (From admission, onward)    Start     Ordered   08/06/2018 1247  Troponin I - Now Then Q6H  Now then every 6 hours,   STAT     08/10/2018 1246   08/04/2018 1115  Urine Drug Screen, Qualitative ([place] only)  Once,   STAT     08/22/2018 1114   08/01/2018 0715  Blood culture (routine x 2)  BLOOD CULTURE X 2,   STAT     08/09/2018 0715   Signed and Held  CBC  (heparin)  Once,   R    Comments:  Baseline for heparin therapy IF NOT ALREADY DRAWN.  Notify MD if PLT < 100 K.    Signed and Held   Signed and Held  Creatinine, serum  (heparin)  Once,   R    Comments: Baseline for heparin therapy IF NOT ALREADY DRAWN.    Signed and Held   Signed and Held  TSH  Once,   R     Signed and Held   Signed and Held  CBC  Tomorrow morning,   R     Signed and Held   Signed and Held  Basic metabolic panel  Tomorrow morning,   R      Signed and Held          Vitals/Pain Today's Vitals   08/21/2018 1030 08/07/2018 1055 08/03/2018 1247 07/30/2018 1249  BP: (!) 88/53   90/62  Pulse:    (!) 112  Resp: 14   (!) 24  Temp:      TempSrc:      SpO2:    98%  Weight:      Height:      PainSc:  5  8      Isolation Precautions Droplet and Contact precautions  Medications Medications  digoxin (LANOXIN) 0.25 MG/ML injection 0.125 mg (has no administration in time range)  ceFEPIme (MAXIPIME) 2 g in sodium chloride 0.9 % 100 mL IVPB (has no administration in time range)  vancomycin (VANCOCIN) IVPB 750 mg/150 ml premix (has no administration in time range)  vancomycin (VANCOCIN) IVPB 750 mg/150 ml premix (has no administration in time range)  sodium chloride 0.9 % bolus 500 mL (0 mLs Intravenous Stopped 08/01/2018 0942)  ceFEPIme (MAXIPIME) 2 g in sodium chloride 0.9 % 100 mL IVPB (0 g Intravenous Stopped 08/15/2018 0826)  metroNIDAZOLE (FLAGYL) IVPB 500 mg ( Intravenous Stopped 08/17/2018 0908)  vancomycin (VANCOCIN) IVPB 1000 mg/200 mL premix ( Intravenous Stopped 08/02/2018 0908)  benzocaine (HURRICAINE) 20 % mouth spray ( Mouth/Throat Given by Other 08/02/2018 0832)  acetaminophen (TYLENOL) tablet 650 mg (650 mg Oral Given 07/28/2018 0932)  albuterol (VENTOLIN HFA) 108 (90 Base) MCG/ACT inhaler 2 puff (2 puffs Inhalation Given 08/17/2018 0933)  iohexol (OMNIPAQUE) 350 MG/ML injection 75 mL (75 mLs Intravenous Contrast Given 07/25/2018 0940)  fentaNYL (SUBLIMAZE) injection 25 mcg (25 mcg Intravenous Given 08/09/2018 1007)  sodium chloride 0.9 % bolus 250 mL (0 mLs Intravenous Stopped 08/20/2018 1207)  famotidine (PEPCID) IVPB 20 mg premix (0 mg Intravenous Stopped 07/25/2018 1207)  iopamidol (ISOVUE-370) 76 % injection 100 mL (100 mLs Intravenous Contrast Given 08/06/2018 1102)  pantoprazole (PROTONIX) injection 40 mg (40 mg Intravenous Given 08/12/2018 1150)  fentaNYL (SUBLIMAZE) injection 25 mcg (25 mcg Intravenous Given 08/02/2018 1244)    Mobility walks  with device Low fall risk   Focused Assessments Cardiac Assessment Handoff:  Cardiac Rhythm: Sinus tachycardia Lab Results  Component Value Date   CKTOTAL 171 04/26/2013   CKMB 3.0 04/26/2013   TROPONINI 0.14 ([place]) 08/22/2018   No results found for: DDIMER Does the Patient currently have chest pain? Yes  , Neuro Assessment Handoff:  Swallow screen pass? Yes  Cardiac Rhythm: Sinus tachycardia       Neuro Assessment:   Neuro Checks:      Last Documented NIHSS Modified Score:   Has TPA been given? No If patient is a Neuro Trauma and patient is going to OR before floor call report to [place] nurse: [phone] or [phone]  , Renal Assessment Handoff:  Hemodialysis Schedule: Hemodialysis  Schedule: Monday/Wednesday/Friday Last Hemodialysis date and time: 6/12   Restricted appendage: left arm  , Pulmonary Assessment Handoff:  Lung sounds:   O2 Device: Nasal Cannula O2 Flow Rate (L/min): 2 L/min      R Recommendations: See Admitting Provider Note  Report given to:   Additional Notes: trop 0.14 at 12:44

## 2018-08-05 NOTE — Progress Notes (Signed)
Transferred from the ED. Received report from [place], Therapist, sports. On 2L [place] BP 90/68. Complains of left squeezing chest pain. Pain an 8 on scale of 0-10. Talked with Dr. Rockey Situ and Dr. Mortimer Fries. Gave 2mg  of morphine for pain see MAR.

## 2018-08-05 NOTE — ED Notes (Signed)
Patient transported to CT 

## 2018-08-05 NOTE — H&P (Signed)
[facility] [place] NAME: [name]    MR#:  [id number]  DATE OF BIRTH:  [birth year]  DATE OF ADMISSION:  08/12/2018  PRIMARY CARE PHYSICIAN: Donnie Coffin, MD   REQUESTING/REFERRING PHYSICIAN:   CHIEF COMPLAINT:   Chief Complaint  Patient presents with  . Chest Pain    HISTORY OF PRESENT ILLNESS: [name]  is a 63 y.o. male with a known history of multiple medical problems including end-stage renal disease, coronary artery disease, chronic combined systolic and diastolic CHF, diabetes type 2,, essential hypertension, peripheral vascular disease, pulmonary hypertension who is presenting to the hospital with complaint of chest pain.  Patient states that he went to hemodialysis yesterday and had 2 to 3 L removed due to low blood pressure.  Since then patient started having left-sided chest pain.  In the ER patient is noted to have systolic blood pressure in the 80s with increased work of breathing.  Patient in the ER is noted to have persistent hypotension and he is also noticed to have atrial flutter.  He denies any fevers or chills.       PAST MEDICAL HISTORY:   Past Medical History:  Diagnosis Date  . Arthritis   . CAD (coronary artery disease)    a. 09/2013 Myoview [facility]): basal inf defect more pronounced @ rest, likely artifact, EF 48%, low risk study; b. 06/2016 Cath [facility]): LAD 10p, 30-45m, 40-50d, D1 90(small), D2 40, D3 60-70, D4 30, LCX 10p, 28m, 20d, Om1 30, OM3 40, RCA 100p w/ bridging collats, m/d RCA fill via collats, small/mod caliber-->Med Rx; c. 02/2017 MV: inf infarct w/ peri-inf ischemia. EF <30%.   . Chronic combined systolic and diastolic CHF (congestive heart failure) ([place])    a. 10/2013 Echo [facility]): EF 50%, diast dysfxn;  b. 10/2014 Echo: EF 30-35%, diff HK, gr1 DD;  c. 04/2016 Echo: EF 20-25%, sev dil LV, diff HK, gr3 DD; d. 06/2016 Echo: EF 30-35%, Gr2 DD.  . Diabetes ([place])   . Dyspnea   . ESRD (end stage renal disease)  ([place])    a. MWF dialysis @ [facility].  . Hepatitis    Patient is unsure type of Hepatitis he has  . Hypertension   . Hypertensive heart disease with CHF (congestive heart failure) ([place])   . Mixed Ischemic and non-ischemic cardiomyopathy    a. 10/2013 Echo [facility]): EF 50%, diast dysfxn;  b. 10/2014 Echo: EF 30-35%, diff HK, gr1 DD;  c. 04/2016 Echo: EF 20-25%, sev dil LV, diff HK, gr3 DD; d. 06/2016 Echo: EF 30-35%, Gr2 DD.  [name] Moderate Aortic insufficiency    a. 04/2016 Echo: Mod AI.  [name] Myocardial infarction ([place])   . Peripheral vascular disease ([place])   . Pulmonary hypertension ([place])    a. 10/2014 Echo: Mod-Sev PAH.  [name] PVC's (premature ventricular contractions)    a. 02/2017 24 hr holter: freq polymorphic PVCs w/ total of 38K beats in 24 hrs (34% burden).  . Renal insufficiency     PAST SURGICAL HISTORY:  Past Surgical History:  Procedure Laterality Date  . A/V FISTULAGRAM Left 04/06/2016   Procedure: A/V Fistulagram;  Surgeon: Katha Cabal, MD;  Location: [place] CV LAB;  Service: Cardiovascular;  Laterality: Left;  . A/V FISTULAGRAM Left 01/19/2017   Procedure: A/V FISTULAGRAM;  Surgeon: Katha Cabal, MD;  Location: [place] CV LAB;  Service: Cardiovascular;  Laterality: Left;  . A/V SHUNT INTERVENTION N/A 04/06/2016  Procedure: A/V Shunt Intervention;  Surgeon: Katha Cabal, MD;  Location: [place] CV LAB;  Service: Cardiovascular;  Laterality: N/A;  . A/V SHUNT INTERVENTION N/A 06/29/2016   Procedure: A/V Shunt Intervention;  Surgeon: Katha Cabal, MD;  Location: [place] CV LAB;  Service: Cardiovascular;  Laterality: N/A;  . AV FISTULA PLACEMENT Left 2014  . AV FISTULA PLACEMENT Left 06/01/2017   Procedure: INSERTION OF ARTERIOVENOUS (AV) GORE-TEX GRAFT ARM ( BRACHIAL AXILLARY );  Surgeon: Katha Cabal, MD;  Location: [facility] ORS;  Service: Vascular;  Laterality: Left;  . DIALYSIS/PERMA CATHETER INSERTION Right    Dr. Delana Meyer  .  DIALYSIS/PERMA CATHETER REMOVAL N/A 07/05/2017   Procedure: DIALYSIS/PERMA CATHETER REMOVAL;  Surgeon: Katha Cabal, MD;  Location: [place] CV LAB;  Service: Cardiovascular;  Laterality: N/A;  . EMBOLIZATION N/A 05/09/2018   Procedure: EMBOLIZATION (Duodenal Ulceration);  Surgeon: Katha Cabal, MD;  Location: [place] CV LAB;  Service: Cardiovascular;  Laterality: N/A;  . ESOPHAGOGASTRODUODENOSCOPY N/A 05/04/2018   Procedure: ESOPHAGOGASTRODUODENOSCOPY (EGD);  Surgeon: Virgel Manifold, MD;  Location: [facility] END[facility]COPY;  Service: Gastroenterology;  Laterality: N/A;  . ESOPHAGOGASTRODUODENOSCOPY (EGD) WITH PROPOFOL N/A 04/20/2018   Procedure: ESOPHAGOGASTRODUODENOSCOPY (EGD) WITH PROPOFOL;  Surgeon: Jonathon Bellows, MD;  Location: [facility]COPY;  Service: Gastroenterology;  Laterality: N/A;  . PERIPHERAL VASCULAR CATHETERIZATION N/A 10/03/2014   Procedure: A/V Shuntogram/Fistulagram;  Surgeon: Algernon Huxley, MD;  Location: [place] CV LAB;  Service: Cardiovascular;  Laterality: N/A;  . PERIPHERAL VASCULAR CATHETERIZATION Left 10/03/2014   Procedure: A/V Shunt Intervention;  Surgeon: Algernon Huxley, MD;  Location: [place] CV LAB;  Service: Cardiovascular;  Laterality: Left;  . PERIPHERAL VASCULAR CATHETERIZATION Left 05/01/2015   Procedure: A/V Shuntogram/Fistulagram;  Surgeon: Algernon Huxley, MD;  Location: [place] CV LAB;  Service: Cardiovascular;  Laterality: Left;  . PERIPHERAL VASCULAR CATHETERIZATION N/A 05/01/2015   Procedure: A/V Shunt Intervention;  Surgeon: Algernon Huxley, MD;  Location: [place] CV LAB;  Service: Cardiovascular;  Laterality: N/A;  . UPPER EXTREMITY ANGIOGRAPHY Bilateral 06/29/2016   Procedure: Upper Extremity Angiography;  Surgeon: Katha Cabal, MD;  Location: [place] CV LAB;  Service: Cardiovascular;  Laterality: Bilateral;  . UPPER EXTREMITY INTERVENTION  06/29/2016   Procedure: Upper Extremity Intervention;  Surgeon: Katha Cabal,  MD;  Location: [place] CV LAB;  Service: Cardiovascular;;  . UPPER EXTREMITY VENOGRAPHY Left 03/15/2017   Procedure: UPPER EXTREMITY VENOGRAPHY;  Surgeon: Katha Cabal, MD;  Location: [place] CV LAB;  Service: Cardiovascular;  Laterality: Left;    SOCIAL HISTORY:  Social History   Tobacco Use  . Smoking status: Former Smoker    Packs/day: 0.25    Years: 40.00    Pack years: 10.00    Types: Cigarettes    Quit date: 10/12/2016    Years since quitting: 1.8  . Smokeless tobacco: Never Used  Substance Use Topics  . Alcohol use: No    Alcohol/week: 0.0 standard drinks    FAMILY HISTORY:  Family History  Problem Relation Age of Onset  . Hypertension Son   . Diabetes Son   . Hypertension Mother   . Diabetes Mother   . Diabetes Sister     DRUG ALLERGIES:  Allergies  Allergen Reactions  . Sulfa Antibiotics Shortness Of Breath, Itching and Rash    REVIEW OF SYSTEMS:   CONSTITUTIONAL: No fever, positive fatigue or positive weakness.  EYES: No blurred or double vision.  EARS, NOSE, AND THROAT: No  tinnitus or ear pain.  RESPIRATORY: No cough, shortness of breath, wheezing or hemoptysis.  CARDIOVASCULAR: Positive chest pain, orthopnea, edema.  GASTROINTESTINAL: No nausea, vomiting, diarrhea or abdominal pain.  GENITOURINARY: No dysuria, hematuria.  ENDOCRINE: No polyuria, nocturia,  HEMATOLOGY: No anemia, easy bruising or bleeding SKIN: No rash or lesion. MUSCULOSKELETAL: No joint pain or arthritis.   NEUROLOGIC: No tingling, numbness, weakness.  PSYCHIATRY: No anxiety or depression.   MEDICATIONS AT HOME:  Prior to Admission medications   Medication Sig Start Date End Date Taking? Authorizing Provider  atorvastatin (LIPITOR) 40 MG tablet Take 40 mg by mouth daily. 07/26/18  Yes [provider]  carvedilol (COREG) 3.125 MG tablet Take 3.125 mg by mouth 2 (two) times a day. 07/27/18  Yes [provider]  ENTRESTO 24-26 MG Take 1 tablet by mouth  2 (two) times a day. 07/26/18  Yes [provider]  furosemide (LASIX) 40 MG tablet Take 40 mg by mouth 2 (two) times a day. 07/26/18  Yes [provider]  hydrALAZINE (APRESOLINE) 100 MG tablet Take 100 mg by mouth daily. 07/26/18  Yes [provider]  isosorbide mononitrate (IMDUR) 30 MG 24 hr tablet Take 30 mg by mouth daily. 07/26/18  Yes [provider]  pantoprazole (PROTONIX) 40 MG tablet Take 40 mg by mouth 2 (two) times a day. 07/26/18  Yes [provider]  ceFEPIme 500 mg in dextrose 5 % 50 mL Inject 500 mg into the vein daily. 05/11/18   Bradly Bienenstock, NP  chlorhexidine (PERIDEX) 0.12 % solution 15 mLs by Mouth Rinse route 2 (two) times daily. 05/10/18   Bradly Bienenstock, NP  Chlorhexidine Gluconate Cloth 2 % PADS Apply 6 each topically daily. 05/10/18   Bradly Bienenstock, NP  epoetin alfa (EPOGEN,PROCRIT) 00938 UNIT/ML injection Inject 0.4 mLs (4,000 Units total) into the vein every Monday, Wednesday, and Friday with hemodialysis. 05/10/18   Darel Hong D, NP  haloperidol lactate (HALDOL) 5 MG/ML injection Inject 0.2 mLs (1 mg total) into the vein every 6 (six) hours as needed (for agitated delirium). 05/10/18   Darel Hong D, NP  ipratropium-albuterol (DUONEB) 0.5-2.5 (3) MG/3ML SOLN Take 3 mLs by nebulization every 4 (four) hours as needed. 05/10/18   Bradly Bienenstock, NP  LORazepam (ATIVAN) 2 MG/ML injection Inject 0.25 mLs (0.5 mg total) into the vein every 4 (four) hours as needed for anxiety. 05/10/18   Darel Hong D, NP  morphine 2 MG/ML injection Inject 0.5 mLs (1 mg total) into the vein every 2 (two) hours as needed (chest pain). 05/10/18   Bradly Bienenstock, NP  mouth rinse LIQD solution 15 mLs by Mouth Rinse route 2 times daily at 12 noon and 4 pm. 05/10/18   Bradly Bienenstock, NP  nitroGLYCERIN (NITROSTAT) 0.4 MG SL tablet Place 1 tablet (0.4 mg total) under the tongue every 5 (five) minutes as needed for chest pain. 05/10/18   Bradly Bienenstock, NP  ondansetron (ZOFRAN) 4 MG/2ML SOLN injection Inject 2 mLs (4 mg total) into the vein every 6 (six) hours as needed for nausea. 05/10/18   Bradly Bienenstock, NP  ondansetron [facility]) 4 MG/2ML SOLN injection Inject 2 mLs (4 mg total) into the vein every 6 (six) hours as needed for nausea, vomiting or refractory nausea / vomiting. 05/10/18   Bradly Bienenstock, NP  pantoprazole (PROTONIX) 40 MG injection Inject 40 mg into the vein every 12 (twelve) hours. 05/13/18   Bradly Bienenstock, NP  pantoprazole 80 mg in sodium chloride 0.9 % 250 mL Inject 8 mg/hr into the vein continuous. 05/10/18   Darel Hong D, NP  phenylephrine 10 mg in sodium chloride 0.9 % 250 mL Inject 0-400 mcg/min into the vein continuous. 05/10/18   Bradly Bienenstock, NP  traMADol (ULTRAM) 50 MG tablet Take 1 tablet (50 mg total) by mouth every 6 (six) hours as needed for moderate pain or severe pain. 04/21/18   Nicholes Mango, MD  Vancomycin (VANCOCIN) 750-5 MG/150ML-% SOLN Inject 150 mLs (750 mg total) into the vein every Monday, Wednesday, and Friday with hemodialysis. 05/10/18   Bradly Bienenstock, NP  vitamin C (VITAMIN C) 500 MG tablet Take 1 tablet (500 mg total) by mouth 2 (two) times daily. 05/10/18   Bradly Bienenstock, NP      PHYSICAL EXAMINATION:   VITAL SIGNS: Blood pressure (!) 88/53, pulse (!) 114, temperature 99.1 F (37.3 C), temperature source Axillary, resp. rate 14, height 5\' 8"  (1.727 m), weight 77.6 kg, SpO2 100 %.  GENERAL:  62 y.o.-year-old patient lying in the bed in distress EYES: Pupils equal, round, reactive to light and accommodation. No scleral icterus. Extraocular muscles intact.  HEENT: Head atraumatic, normocephalic. Oropharynx and nasopharynx clear.  NECK:  Supple, no jugular venous distention. No thyroid enlargement, no tenderness.  LUNGS: Normal breath sounds bilaterally, no wheezing, rales,rhonchi or crepitation. No use of accessory muscles of respiration.  CARDIOVASCULAR: Irregular  tachycardic. No murmurs, rubs, or gallops.  ABDOMEN: Soft, nontender, nondistended. Bowel sounds present. No organomegaly or mass.  EXTREMITIES: No pedal edema, cyanosis, or clubbing.  NEUROLOGIC: Cranial nerves II through XII are intact. Muscle strength 5/5 in all extremities. Sensation intact. Gait not checked.  PSYCHIATRIC: The patient is alert and oriented x 3.  SKIN: No obvious rash, lesion, or ulcer.   LABORATORY PANEL:   CBC Recent Labs  Lab 08/04/2018 0710  WBC 7.3  HGB 10.1*  HCT 31.6*  PLT 150  MCV 87.8  MCH 28.1  MCHC 32.0  RDW 19.1*  LYMPHSABS 0.8  MONOABS 1.8*  EOSABS 0.1  BASOSABS 0.1   ------------------------------------------------------------------------------------------------------------------  Chemistries  Recent Labs  Lab 08/22/2018 0710  NA 141  K 4.4  CL 105  CO2 23  GLUCOSE 78  BUN 45*  CREATININE 5.27*  CALCIUM 8.3*  AST 26  ALT 21  ALKPHOS 81  BILITOT 1.0   ------------------------------------------------------------------------------------------------------------------ estimated creatinine clearance is 13.7 mL/min (A) (by C-G formula based on SCr of 5.27 mg/dL (H)). ------------------------------------------------------------------------------------------------------------------ No results for input(s): TSH, T4TOTAL, T3FREE, THYROIDAB in the last 72 hours.  Invalid input(s): FREET3   Coagulation profile No results for input(s): INR, PROTIME in the last 168 hours. ------------------------------------------------------------------------------------------------------------------- No results for input(s): DDIMER in the last 72 hours. -------------------------------------------------------------------------------------------------------------------  Cardiac Enzymes Recent Labs  Lab 08/12/2018 0710  TROPONINI 0.13*    ------------------------------------------------------------------------------------------------------------------ Invalid input(s): POCBNP  ---------------------------------------------------------------------------------------------------------------  Urinalysis    Component Value Date/Time   COLORURINE Straw 11/05/2012 2135   APPEARANCEUR Clear 11/05/2012 2135   LABSPEC 1.005 11/05/2012 2135   PHURINE 7.0 11/05/2012 2135   GLUCOSEU 50 mg/dL 11/05/2012 2135   HGBUR 1+ 11/05/2012 2135   BILIRUBINUR Negative 11/05/2012 2135   KETONESUR Negative 11/05/2012 2135   PROTEINUR 25 mg/dL 11/05/2012 2135   NITRITE Negative 11/05/2012 2135   LEUKOCYTESUR Negative 11/05/2012 2135     RADIOLOGY: Dg Chest 2 View  Result Date: 08/12/2018 CLINICAL DATA:  New cough and shortness of breath EXAM: CHEST - 2 VIEW COMPARISON:  May 10, 2018 FINDINGS: Vascular stent projects over the left subclavian region. Stable cardiomegaly. The hila, mediastinum, lungs, and pleura are normal. IMPRESSION: Persistent cardiomegaly.  No other abnormalities. Electronically Signed   By: Dorise Bullion III M.D   On: 08/22/2018 08:00   Ct Angio Chest Pe W And/or Wo Contrast  Result Date: 08/21/2018 CLINICAL DATA:  Chest pain. EXAM: CT ANGIOGRAPHY CHEST WITH CONTRAST TECHNIQUE: Multidetector CT imaging of the chest was performed using the standard protocol during bolus administration of intravenous contrast. Multiplanar CT image reconstructions and MIPs were obtained to evaluate the vascular anatomy. CONTRAST:  49mL OMNIPAQUE IOHEXOL 350 MG/ML SOLN COMPARISON:  Chest x-ray August 05, 2018.  Chest CT Jul 04, 2016. FINDINGS: Cardiovascular: Cardiomegaly is noted. A stent is seen in the left brachiocephalic vein. Patency cannot be evaluated due to lack of contrast in these vessels due to a right-sided injection. Atherosclerotic changes are seen in the aorta. The ascending thoracic aorta measures up to 4.4 cm, unchanged since May  of 2018. No dissection identified. The main pulmonary artery measures 3.7 cm. No pulmonary emboli. Mediastinum/Nodes: The thyroid and esophagus are normal. No adenopathy. Tiny pleural effusions are noted. No pericardial effusion. Lungs/Pleura: Left retrocardiac atelectasis is noted. No other suspicious infiltrates. Central airways are normal. No pneumothorax. Upper Abdomen: No acute abnormality. Musculoskeletal: No chest wall abnormality. No acute or significant osseous findings. Review of the MIP images confirms the above findings. IMPRESSION: 1. No pulmonary emboli identified. 2. Cardiomegaly.  Tiny pleural effusions. 3. There is a stent in the left brachiocephalic vein. Patency cannot be evaluated due to a right-sided injection. 4. Atherosclerotic changes in the aorta. 5. 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 Recommend annual imaging followup by CTA or MRA. This recommendation follows 2010 ACCF/AHA/AATS/ACR/ASA/SCA/SCAI/SIR/STS/SVM Guidelines for the Diagnosis and Management of Patients with Thoracic Aortic Disease. Circulation. 2010; 121: Q657-Q469. Aortic aneurysm NOS (ICD10-I71.9) 6. The main pulmonary artery measures 3.7 cm which raises the possibility of pulmonary arterial hypertension. 7. Mild left basilar atelectasis in the retrocardiac region. Aortic Atherosclerosis (ICD10-I70.0). Electronically Signed   By: Dorise Bullion III M.D   On: 08/01/2018 10:29   Ct Angio Chest/abd/pel For Dissection W And/or Wo Contrast  Result Date: 07/24/2018 CLINICAL DATA:  Chest pain for 4 days.  Cough. EXAM: CT ANGIOGRAPHY CHEST, ABDOMEN AND PELVIS TECHNIQUE: Multidetector CT imaging through the chest, abdomen and pelvis was performed using the standard protocol during bolus administration of intravenous contrast. Multiplanar reconstructed images and MIPs were obtained and reviewed to evaluate the vascular anatomy. CONTRAST:  173mL ISOVUE-370 IOPAMIDOL (ISOVUE-370)  INJECTION 76% COMPARISON:  CT of the chest August 05, 2018. CT of the abdomen and pelvis May 04, 2018 FINDINGS: CTA CHEST FINDINGS Cardiovascular: Cardiomegaly. Coronary artery calcifications identified. The pulmonary arteries were better assessed on the recent CT a of the chest for PE. No pulmonary emboli in the central pulmonary arteries. The patient had a normal PE study earlier today as well. Atherosclerotic changes are seen in the thoracic aorta without dissection. The ascending thoracic aorta measures 4.2 cm on today's study, mildly aneurysmal. Mediastinum/Nodes: There is a stent in the left brachiocephalic vein which is well opacified on this study. The thyroid and esophagus are normal. A borderline node anterior to the trachea measuring 14 mm on axial image 40 is stable since 2018, likely reactive. No other evidence of adenopathy identified. Small pleural effusions. No pericardial effusion. Mild increased attenuation in the subcutaneous fat suggests volume overload.  Lungs/Pleura: Tiny pleural effusions. Central airways are normal. Mild atelectasis in the posterior left upper lobe. Mild atelectasis in the retrocardiac region, stable. No pneumothorax. Central airways are unchanged no suspicious pulmonary nodules, masses, or focal infiltrates. Musculoskeletal: There is a healed anterior left rib fracture. No acute bony abnormalities. Review of the MIP images confirms the above findings. CTA ABDOMEN AND PELVIS FINDINGS VASCULAR Aorta: Atherosclerotic change is seen in the nonaneurysmal abdominal aorta, extending into the iliac and femoral vessels. No dissection. Celiac: Patent without evidence of aneurysm, dissection, vasculitis or significant stenosis. SMA: Atherosclerotic changes near the origin of the SMA without significant stenosis. No other abnormalities. Renals: Atherosclerosis at the origin of the bilateral renal arteries is identified. IMA: Patent without evidence of aneurysm, dissection, vasculitis or  significant stenosis. Inflow: Patent without evidence of aneurysm, dissection, vasculitis or significant stenosis. Veins: No obvious venous abnormality within the limitations of this arterial phase study. Review of the MIP images confirms the above findings. NON-VASCULAR Hepatobiliary: No focal liver abnormality is seen. No gallstones, gallbladder wall thickening, or biliary dilatation. Pancreas: Unremarkable. No pancreatic ductal dilatation or surrounding inflammatory changes. Spleen: Normal in size without focal abnormality. Adrenals/Urinary Tract: Adrenal glands are normal. The kidneys are atrophic consistent with renal disease. No hydronephrosis or acute perinephric stranding. The ureters are normal. The bladder is poorly distended but unremarkable. Stomach/Bowel: The stomach and small bowel are unremarkable. The colon is normal. The appendix is unremarkable. Lymphatic: No significant vascular findings are present. No enlarged abdominal or pelvic lymph nodes. Reproductive: Prostate is unremarkable. Other: There is increased attenuation in the subcutaneous fat and in the intra-abdominal fat consistent volume overload. There is a small amount of fluid in the right pericolic gutter. Some of this fluid is adjacent to the appendix but there is no convincing evidence of appendicitis. Musculoskeletal: No acute or significant osseous findings. Review of the MIP images confirms the above findings. IMPRESSION: 1. 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 This is stable since 2018. No dissection. Atherosclerotic changes noted. Recommend annual imaging followup by CTA or MRA. This recommendation follows 2010 ACCF/AHA/AATS/ACR/ASA/SCA/SCAI/SIR/STS/SVM Guidelines for the Diagnosis and Management of Patients with Thoracic Aortic Disease. Circulation. 2010; 121: K270-W237. Aortic aneurysm NOS (ICD10-I71.9) 2. Cardiomegaly.  Coronary artery calcifications. 3. Increased attenuation  in the subcutaneous fat diffusely as well as in the intra-abdominal fat with a small amount of fluid in the right pericolic gutter. These findings are most consistent with volume overload. 4. Tiny bilateral pleural effusions. Electronically Signed   By: Dorise Bullion III M.D   On: 08/14/2018 11:36    EKG: Orders placed or performed during the hospital encounter of 08/21/2018  . EKG 12-Lead  . EKG 12-Lead  . ED EKG  . ED EKG  . ED EKG 12-Lead  . ED EKG 12-Lead  . Repeat EKG  . Repeat EKG  . EKG 12-Lead  . EKG 12-Lead  . EKG 12-Lead  . EKG 12-Lead    IMPRESSION AND PLAN: Patient 63 year old with multiple medical problems presenting with chest pain and hypotension  1.  Hypotension etiology unclear we will continue broad-spectrum antibiotics follow blood cultures. Also patient has atrial fibrillation with a rapid ventricular rate cardiology has seen the patient Patient may need pressors He will need to go to stepdown I have discussed the case with Dr. Stoney Bang  2.  Atrial flutter with elevated heart rate per cardiology will try digoxin for now.  Further recommendations per cardiology  3.  Chest pain etiology unclear we will trend troponins use morphine as needed We will trend cardiac enzyme Check echocardiogram of the heart  4.  End-stage renal disease nephrology has been consulted  5.  Diabetes type 2 we will place patient on sliding scale insulin  6.  Hyperlipidemia continue Lipitor  7.  Miscellaneous heparin for DVT prophylaxis  7.  Chronic systolic and diastolic CHF    All the records are reviewed and case discussed with ED provider. Management plans discussed with the patient, family and they are in agreement.  CODE STATUS: Code Status History    Date Active Date Inactive Code Status Order ID Comments User Context   05/03/2018 1843 05/10/2018 1609 Full Code [id number]  Bettey Costa, MD ED   04/18/2018 0420 04/22/2018 0122 Full Code [id number]  Harrie Foreman, MD  Inpatient   02/08/2018 0233 02/08/2018 2214 Full Code [id number]  Lance Coon, MD ED   03/14/2017 0007 03/14/2017 2204 Full Code [id number]  Amelia Jo, MD Inpatient   02/15/2017 2136 02/18/2017 1812 Full Code [id number]  Demetrios Loll, MD Inpatient   09/20/2016 0318 09/21/2016 1915 Full Code [id number]  Lance Coon, MD ED   08/22/2016 1658 08/24/2016 1607 Full Code [id number]  Demetrios Loll, MD Inpatient   07/04/2016 1929 07/05/2016 1728 Full Code [id number]  Idelle Crouch, MD Inpatient   06/07/2016 0102 06/08/2016 1514 Full Code [id number]  Hugel[place]yer, [place], DO Inpatient   04/30/2016 0007 05/01/2016 1821 Full Code [id number]  Lance Coon, MD Inpatient   02/26/2016 2107 02/28/2016 1804 Full Code [id number]  Lance Coon, MD Inpatient   01/07/2016 0132 01/07/2016 1724 Full Code [id number]  Lane, [place], DO ED   08/02/2015 2316 08/04/2015 2311 Full Code [id number]  Quintella Baton, MD Inpatient   12/02/2014 2052 12/04/2014 1753 Full Code [id number]  Vaughan Basta, MD Inpatient   11/10/2014 2323 11/12/2014 1506 Full Code [id number]  Gladstone Lighter, MD Inpatient   Advance Care Planning Activity       TOTAL TIME TAKING CARE OF THIS PATIENT: 55 minutes critical care.    Dustin Flock M.D on 08/09/2018 at 12:17 PM  Between 7am to 6pm - Pager - [phone]  After 6pm go to [email or web address] - password [email or web address]  [facility]  [phone]  CC: Primary care physician; Donnie Coffin, MD

## 2018-08-05 NOTE — Progress Notes (Signed)
CODE SEPSIS - PHARMACY COMMUNICATION  **Broad Spectrum Antibiotics should be administered within 1 hour of Sepsis diagnosis**  Time Code Sepsis Called/Page Received: 0715  Antibiotics Ordered: Vancomycin, metronidazole, cefepime  Time of 1st antibiotic administration: 0756  Additional action taken by pharmacy: N/A    Eston Mould ,PharmD Clinical Pharmacist  08/14/2018  10:46 AM

## 2018-08-05 NOTE — Progress Notes (Signed)
Patient underwent HD yesterday with good volume removal.  Blood pressure marginal at the moment.  Hold off on further HD for now, reassess tomorrow.

## 2018-08-05 NOTE — Consult Note (Signed)
Pharmacy Antibiotic Note  [name] is a 63 y.o. male admitted on 08/04/2018 with sepsis.  Pharmacy has been consulted for cefepime and vancomycin dosing. Pt has ESRD and is on HD MWF.   Plan: Will order cefepime 2 g on HD days.   Pt received vancomycin 1000 mg x 1 in the ED. Will give an additional 750 mg x 1 for a total loading dose of 1750 mg. Maintenance dose of 750 mg on HD days will be ordered. Level will be drawn on the day 4-5 on abx.   Height: 5\' 8"  (172.7 cm) Weight: 171 lb (77.6 kg) IBW/kg (Calculated) : 68.4  Temp (24hrs), Avg:99.1 F (37.3 C), Min:99.1 F (37.3 C), Max:99.1 F (37.3 C)  Recent Labs  Lab 08/20/2018 0710 08/10/2018 0746  WBC 7.3  --   CREATININE 5.27*  --   LATICACIDVEN  --  0.9    Estimated Creatinine Clearance: 13.7 mL/min (A) (by C-G formula based on SCr of 5.27 mg/dL (H)).    Allergies  Allergen Reactions  . Sulfa Antibiotics Shortness Of Breath, Itching and Rash    Antimicrobials this admission: 6/13 cefepime >>  6/13 vancomycin >>   Dose adjustments this admission: None  Microbiology results: 6/13 BCx: pending  Thank you for allowing pharmacy to be a part of this patient's care.  Oswald Hillock, PharmD, BCPS 08/18/2018 12:43 PM

## 2018-08-05 NOTE — H&P (Signed)
MR#:  [id number] DATE OF BIRTH:  02/28/1955 DATE OF ADMISSION:  08/19/2018 PRIMARY CARE PHYSICIAN: Donnie Coffin, MD  REQUESTING/REFERRING PHYSICIAN:   CHIEF COMPLAINT:   Chief Complaint  Patient presents with  . Chest Pain    HISTORY OF PRESENT ILLNESS:  [name]  is a 63 y.o. male +CAD +ESRD on HD +CHF +DM complains of chest pain x 48 hrs 2-3 L fluid removed from HD yesterday SBP in ER was 80's, atrial flutter as well No fevers, chills Still having chest pain TROP 0.14 BNP 2373 EKG atrial flutter LA 0.9  +h/o COCAINE ABUSE  COVID-19 NEGATIVE: Acute COVID-19 infection ruled out by PCR.   Admitted to ICU for further assessment      PAST MEDICAL HISTORY:   Past Medical History:  Diagnosis Date  . Arthritis   . CAD (coronary artery disease)    a. 09/2013 [facility]): basal inf defect more pronounced @ rest, likely artifact, EF 48%, low risk study; b. 06/2016 [facility]): LAD 10p, 30-61m, 40-50d, D1 90(small), D2 40, D3 60-70, D4 30, LCX 10p, 84m, 20d, Om1 30, OM3 40, RCA 100p w/ bridging collats, m/d RCA fill via collats, small/mod caliber-->Med Rx; c. 02/2017 MV: inf infarct w/ peri-inf ischemia. EF <30%.   . Chronic combined systolic and diastolic CHF (congestive heart failure) ([place])    a. 10/2013 [facility]): EF 50%, diast dysfxn;  b. 10/2014 Echo: EF 30-35%, diff HK, gr1 DD;  c. 04/2016 Echo: EF 20-25%, sev dil LV, diff HK, gr3 DD; d. 06/2016 Echo: EF 30-35%, Gr2 DD.  . Diabetes ([place])   . Dyspnea   . ESRD (end stage renal disease) ([place])    a. MWF dialysis @ [facility].  . Hepatitis    Patient is unsure type of Hepatitis he has  . Hypertension   . Hypertensive heart disease with CHF (congestive heart failure) ([place])   . Mixed Ischemic and non-ischemic cardiomyopathy    a. 10/2013 Echo [facility]): EF 50%, diast dysfxn;  b. 10/2014 Echo: EF 30-35%, diff HK, gr1 DD;  c. 04/2016 Echo: EF 20-25%, sev dil LV, diff HK, gr3 DD; d. 06/2016 Echo: EF 30-35%, Gr2 DD.  [name] Moderate  Aortic insufficiency    a. 04/2016 Echo: Mod AI.  [name] Myocardial infarction ([place])   . Peripheral vascular disease ([place])   . Pulmonary hypertension ([place])    a. 10/2014 Echo: Mod-Sev PAH.  [name] PVC's (premature ventricular contractions)    a. 02/2017 24 hr holter: freq polymorphic PVCs w/ total of 38K beats in 24 hrs (34% burden).  . Renal insufficiency     PAST SURGICAL HISTORY:  Past Surgical History:  Procedure Laterality Date  . A/V FISTULAGRAM Left 04/06/2016   Procedure: A/V Fistulagram;  Surgeon: Katha Cabal, MD;  Location: [place] [facility];  Service: Cardiovascular;  Laterality: Left;  . A/V FISTULAGRAM Left 01/19/2017   Procedure: A/V FISTULAGRAM;  Surgeon: Katha Cabal, MD;  Location: [place] [facility];  Service: Cardiovascular;  Laterality: Left;  . A/V SHUNT INTERVENTION N/A 04/06/2016   Procedure: A/V Shunt Intervention;  Surgeon: Katha Cabal, MD;  Location: [place] CV [facility];  Service: Cardiovascular;  Laterality: N/A;  . A/V SHUNT INTERVENTION N/A 06/29/2016   Procedure: A/V Shunt Intervention;  Surgeon: Katha Cabal, MD;  Location: [place] CV LAB;  Service: Cardiovascular;  Laterality: N/A;  . AV FISTULA PLACEMENT Left 2014  . AV FISTULA PLACEMENT Left 06/01/2017   Procedure: INSERTION OF ARTERIOVENOUS (  AV) GORE-TEX GRAFT ARM ( BRACHIAL AXILLARY );  Surgeon: Katha Cabal, MD;  Location: [facility] ORS;  Service: Vascular;  Laterality: Left;  . DIALYSIS/PERMA CATHETER INSERTION Right    Dr. Delana Meyer  . DIALYSIS/PERMA CATHETER REMOVAL N/A 07/05/2017   Procedure: DIALYSIS/PERMA CATHETER REMOVAL;  Surgeon: Katha Cabal, MD;  Location: [place] [facility];  Service: Cardiovascular;  Laterality: N/A;  . EMBOLIZATION N/A 05/09/2018   Procedure: EMBOLIZATION (Duodenal Ulceration);  Surgeon: Katha Cabal, MD;  Location: [place] CV [facility];  Service: Cardiovascular;  Laterality: N/A;  . ESOPHAGOGASTRODUODENOSCOPY N/A 05/04/2018   Procedure:  ESOPHAGOGASTRODUODENOSCOPY (EGD);  Surgeon: Virgel Manifold, MD;  Location: [facility] ENDOSCOPY;  Service: Gastroenterology;  Laterality: N/A;  . ESOPHAGOGASTRODUODENOSCOPY (EGD) WITH PROPOFOL N/A 04/20/2018   Procedure: ESOPHAGOGASTRODUODENOSCOPY (EGD) WITH PROPOFOL;  Surgeon: Jonathon Bellows, MD;  Location: [facility] ENDOSCOPY;  Service: Gastroenterology;  Laterality: N/A;  . PERIPHERAL VASCULAR CATHETERIZATION N/A 10/03/2014   Procedure: A/V Shuntogram/Fistulagram;  Surgeon: Algernon Huxley, MD;  Location: [place] CV LAB;  Service: Cardiovascular;  Laterality: N/A;  . PERIPHERAL VASCULAR CATHETERIZATION Left 10/03/2014   Procedure: A/V Shunt Intervention;  Surgeon: Algernon Huxley, MD;  Location: [place] [facility];  Service: Cardiovascular;  Laterality: Left;  . PERIPHERAL VASCULAR CATHETERIZATION Left 05/01/2015   Procedure: A/V Shuntogram/Fistulagram;  Surgeon: Algernon Huxley, MD;  Location: [place] [facility];  Service: Cardiovascular;  Laterality: Left;  . PERIPHERAL VASCULAR CATHETERIZATION N/A 05/01/2015   Procedure: A/V Shunt Intervention;  Surgeon: Algernon Huxley, MD;  Location: [place] [facility];  Service: Cardiovascular;  Laterality: N/A;  . UPPER EXTREMITY ANGIOGRAPHY Bilateral 06/29/2016   Procedure: Upper Extremity Angiography;  Surgeon: Katha Cabal, MD;  Location: [place] [facility];  Service: Cardiovascular;  Laterality: Bilateral;  . UPPER EXTREMITY INTERVENTION  06/29/2016   Procedure: Upper Extremity Intervention;  Surgeon: Katha Cabal, MD;  Location: [place] [facility];  Service: Cardiovascular;;  . UPPER EXTREMITY VENOGRAPHY Left 03/15/2017   Procedure: UPPER EXTREMITY VENOGRAPHY;  Surgeon: Katha Cabal, MD;  Location: [place] [facility];  Service: Cardiovascular;  Laterality: Left;    SOCIAL HISTORY:  Social History   Tobacco Use  . Smoking status: Former Smoker    Packs/day: 0.25    Years: 40.00    Pack years: 10.00    Types: Cigarettes    Quit date:  10/12/2016    Years since quitting: 1.8  . Smokeless tobacco: Never Used  Substance Use Topics  . Alcohol use: No    Alcohol/week: 0.0 standard drinks    FAMILY HISTORY:  Family History  Problem Relation Age of Onset  . Hypertension Son   . Diabetes Son   . Hypertension Mother   . Diabetes Mother   . Diabetes Sister     DRUG ALLERGIES:  Allergies  Allergen Reactions  . Sulfa Antibiotics Shortness Of Breath, Itching and Rash    Review of Systems:  Gen:  Denies  fever, sweats, chills weigh loss  HEENT: Denies blurred vision, double vision, ear pain, eye pain, hearing loss, nose bleeds, sore throat Cardiac:  +chest pain  Resp:   No cough, -sputum production, +shortness of breath,-wheezing, -hemoptysis,  Gi: Denies swallowing difficulty, stomach pain, nausea or vomiting, diarrhea, constipation, bowel incontinence Gu:  Denies bladder incontinence, burning urine Ext:   Denies Joint pain, stiffness or swelling Skin: Denies  skin rash, easy bruising or bleeding or hives Endoc:  Denies polyuria, polydipsia , polyphagia or weight change Psych:  Denies depression, insomnia or hallucinations  Other:  All other systems negative  MEDICATIONS AT HOME:  Prior to Admission medications   Medication Sig Start Date End Date Taking? Authorizing Provider  atorvastatin (LIPITOR) 40 MG tablet Take 40 mg by mouth daily. 07/26/18  Yes [provider]  carvedilol (COREG) 3.125 MG tablet Take 3.125 mg by mouth 2 (two) times a day. 07/27/18  Yes [provider]  ENTRESTO 24-26 MG Take 1 tablet by mouth 2 (two) times a day. 07/26/18  Yes [provider]  furosemide (LASIX) 40 MG tablet Take 40 mg by mouth 2 (two) times a day. 07/26/18  Yes [provider]  hydrALAZINE (APRESOLINE) 100 MG tablet Take 100 mg by mouth daily. 07/26/18  Yes [provider]  isosorbide mononitrate (IMDUR) 30 MG 24 hr tablet Take 30 mg by mouth daily. 07/26/18  Yes [provider]   pantoprazole (PROTONIX) 40 MG tablet Take 40 mg by mouth 2 (two) times a day. 07/26/18  Yes [provider]  ceFEPIme 500 mg in dextrose 5 % 50 mL Inject 500 mg into the vein daily. 05/11/18   Bradly Bienenstock, NP  chlorhexidine (PERIDEX) 0.12 % solution 15 mLs by Mouth Rinse route 2 (two) times daily. 05/10/18   Bradly Bienenstock, NP  Chlorhexidine Gluconate Cloth 2 % PADS Apply 6 each topically daily. 05/10/18   Bradly Bienenstock, NP  epoetin alfa (EPOGEN,PROCRIT) 88416 UNIT/ML injection Inject 0.4 mLs (4,000 Units total) into the vein every Monday, Wednesday, and Friday with hemodialysis. 05/10/18   Darel Hong D, NP  haloperidol lactate (HALDOL) 5 MG/ML injection Inject 0.2 mLs (1 mg total) into the vein every 6 (six) hours as needed (for agitated delirium). 05/10/18   Darel Hong D, NP  ipratropium-albuterol (DUONEB) 0.5-2.5 (3) MG/3ML SOLN Take 3 mLs by nebulization every 4 (four) hours as needed. 05/10/18   Bradly Bienenstock, NP  LORazepam (ATIVAN) 2 MG/ML injection Inject 0.25 mLs (0.5 mg total) into the vein every 4 (four) hours as needed for anxiety. 05/10/18   Darel Hong D, NP  morphine 2 MG/ML injection Inject 0.5 mLs (1 mg total) into the vein every 2 (two) hours as needed (chest pain). 05/10/18   Bradly Bienenstock, NP  mouth rinse LIQD solution 15 mLs by Mouth Rinse route 2 times daily at 12 noon and 4 pm. 05/10/18   Bradly Bienenstock, NP  nitroGLYCERIN (NITROSTAT) 0.4 MG SL tablet Place 1 tablet (0.4 mg total) under the tongue every 5 (five) minutes as needed for chest pain. 05/10/18   Bradly Bienenstock, NP  ondansetron (ZOFRAN) 4 MG/2ML SOLN injection Inject 2 mLs (4 mg total) into the vein every 6 (six) hours as needed for nausea. 05/10/18   Bradly Bienenstock, NP  ondansetron [facility]) 4 MG/2ML SOLN injection Inject 2 mLs (4 mg total) into the vein every 6 (six) hours as needed for nausea, vomiting or refractory nausea / vomiting. 05/10/18   Bradly Bienenstock, NP  pantoprazole  (PROTONIX) 40 MG injection Inject 40 mg into the vein every 12 (twelve) hours. 05/13/18   Darel Hong D, NP  pantoprazole 80 mg in sodium chloride 0.9 % 250 mL Inject 8 mg/hr into the vein continuous. 05/10/18   Darel Hong D, NP  phenylephrine 10 mg in sodium chloride 0.9 % 250 mL Inject 0-400 mcg/min into the vein continuous. 05/10/18   Bradly Bienenstock, NP  traMADol (ULTRAM) 50 MG tablet Take 1 tablet (50 mg  total) by mouth every 6 (six) hours as needed for moderate pain or severe pain. 04/21/18   Nicholes Mango, MD  Vancomycin (VANCOCIN) 750-5 MG/150ML-% SOLN Inject 150 mLs (750 mg total) into the vein every Monday, Wednesday, and Friday with hemodialysis. 05/10/18   Bradly Bienenstock, NP  vitamin C (VITAMIN C) 500 MG tablet Take 1 tablet (500 mg total) by mouth 2 (two) times daily. 05/10/18   Bradly Bienenstock, NP      PHYSICAL EXAMINATION:   VITAL SIGNS: Blood pressure 90/68, pulse (!) 111, temperature 98 F (36.7 C), temperature source Axillary, resp. rate (!) 21, height 5\' 8"  (1.727 m), weight 75.2 kg, SpO2 98 %.  Physical Examination:   GENERAL:NAD, no fevers, chills, + fatigue HEAD: Normocephalic, atraumatic.  EYES: PERLA, EOMI No scleral icterus.  MOUTH: Moist mucosal membrane.  EAR, NOSE, THROAT: Clear without exudates. No external lesions.  NECK: Supple. No thyromegaly.  No JVD.  PULMONARY: CTA B/L rhonchi CARDIOVASCULAR: S1 and S2. Regular rate and rhythm. No murmurs GASTROINTESTINAL: Soft, nontender, nondistended. Positive bowel sounds.  MUSCULOSKELETAL: No swelling, clubbing, or edema.  NEUROLOGIC: No gross focal neurological deficits. 5/5 strength all extremities SKIN: No ulceration, lesions, rashes, or cyanosis.  PSYCHIATRIC: Insight, judgment intact. -depression -anxiety ALL OTHER ROS ARE NEGATIVE     CBC Recent Labs  Lab 08/11/2018 0710  WBC 7.3  HGB 10.1*  HCT 31.6*  PLT 150  MCV 87.8  MCH 28.1  MCHC 32.0  RDW 19.1*  LYMPHSABS 0.8  MONOABS 1.8*   EOSABS 0.1  BASOSABS 0.1   ------------------------------------------------------------------------------------------------------------------  Chemistries  Recent Labs  Lab 07/30/2018 0710  NA 141  K 4.4  CL 105  CO2 23  GLUCOSE 78  BUN 45*  CREATININE 5.27*  CALCIUM 8.3*  AST 26  ALT 21  ALKPHOS 81  BILITOT 1.0   ------------------------------------------------------------------------------------------------------------------ estimated creatinine clearance is 13.7 mL/min (A) (by C-G formula based on SCr of 5.27 mg/dL (H)). ------------------------------------------------------------------------------------------------------------------ No results for input(s): TSH, T4TOTAL, T3FREE, THYROIDAB in the last 72 hours.  Invalid input(s): FREET3   Coagulation profile No results for input(s): INR, PROTIME in the last 168 hours. ------------------------------------------------------------------------------------------------------------------- No results for input(s): DDIMER in the last 72 hours. -------------------------------------------------------------------------------------------------------------------  Cardiac Enzymes Recent Labs  Lab 07/25/2018 0710 07/30/2018 1157  TROPONINI 0.13* 0.14*   ------------------------------------------------------------------------------------------------------------------ Invalid input(s): POCBNP  ---------------------------------------------------------------------------------------------------------------  Urinalysis    Component Value Date/Time   COLORURINE Straw 11/05/2012 2135   APPEARANCEUR Clear 11/05/2012 2135   LABSPEC 1.005 11/05/2012 2135   PHURINE 7.0 11/05/2012 2135   GLUCOSEU 50 mg/dL 11/05/2012 2135   HGBUR 1+ 11/05/2012 2135   BILIRUBINUR Negative 11/05/2012 2135   KETONESUR Negative 11/05/2012 2135   PROTEINUR 25 mg/dL 11/05/2012 2135   NITRITE Negative 11/05/2012 2135   LEUKOCYTESUR Negative 11/05/2012 2135      RADIOLOGY: Dg Chest 2 View  Result Date: 08/19/2018 CLINICAL DATA:  New cough and shortness of breath EXAM: CHEST - 2 VIEW COMPARISON:  May 10, 2018 FINDINGS: Vascular stent projects over the left subclavian region. Stable cardiomegaly. The hila, mediastinum, lungs, and pleura are normal. IMPRESSION: Persistent cardiomegaly.  No other abnormalities. Electronically Signed   By: Dorise Bullion III M.D   On: 08/10/2018 08:00   Ct Angio Chest Pe W And/or Wo Contrast  Result Date: 07/27/2018 CLINICAL DATA:  Chest pain. EXAM: CT ANGIOGRAPHY CHEST WITH CONTRAST TECHNIQUE: Multidetector CT imaging of the chest was performed using the standard protocol during bolus administration of intravenous contrast. Multiplanar CT  image reconstructions and MIPs were obtained to evaluate the vascular anatomy. CONTRAST:  31mL OMNIPAQUE IOHEXOL 350 MG/ML SOLN COMPARISON:  Chest x-ray August 05, 2018.  Chest CT Jul 04, 2016. FINDINGS: Cardiovascular: Cardiomegaly is noted. A stent is seen in the left brachiocephalic vein. Patency cannot be evaluated due to lack of contrast in these vessels due to a right-sided injection. Atherosclerotic changes are seen in the aorta. The ascending thoracic aorta measures up to 4.4 cm, unchanged since May of 2018. No dissection identified. The main pulmonary artery measures 3.7 cm. No pulmonary emboli. Mediastinum/Nodes: The thyroid and esophagus are normal. No adenopathy. Tiny pleural effusions are noted. No pericardial effusion. Lungs/Pleura: Left retrocardiac atelectasis is noted. No other suspicious infiltrates. Central airways are normal. No pneumothorax. Upper Abdomen: No acute abnormality. Musculoskeletal: No chest wall abnormality. No acute or significant osseous findings. Review of the MIP images confirms the above findings. IMPRESSION: 1. No pulmonary emboli identified. 2. Cardiomegaly.  Tiny pleural effusions. 3. There is a stent in the left brachiocephalic vein. Patency cannot be  evaluated due to a right-sided injection. 4. Atherosclerotic changes in the aorta. 5. 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 Recommend annual imaging followup by CTA or MRA. This recommendation follows 2010 ACCF/AHA/AATS/ACR/ASA/SCA/SCAI/SIR/STS/SVM Guidelines for the Diagnosis and Management of Patients with Thoracic Aortic Disease. Circulation. 2010; 121: F027-X412. Aortic aneurysm NOS (ICD10-I71.9) 6. The main pulmonary artery measures 3.7 cm which raises the possibility of pulmonary arterial hypertension. 7. Mild left basilar atelectasis in the retrocardiac region. Aortic Atherosclerosis (ICD10-I70.0). Electronically Signed   By: Dorise Bullion III M.D   On: 08/04/2018 10:29   Ct Angio Chest/abd/pel For Dissection W And/or Wo Contrast  Result Date: 07/25/2018 CLINICAL DATA:  Chest pain for 4 days.  Cough. EXAM: CT ANGIOGRAPHY CHEST, ABDOMEN AND PELVIS TECHNIQUE: Multidetector CT imaging through the chest, abdomen and pelvis was performed using the standard protocol during bolus administration of intravenous contrast. Multiplanar reconstructed images and MIPs were obtained and reviewed to evaluate the vascular anatomy. CONTRAST:  132mL ISOVUE-370 IOPAMIDOL (ISOVUE-370) INJECTION 76% COMPARISON:  CT of the chest August 05, 2018. CT of the abdomen and pelvis May 04, 2018 FINDINGS: CTA CHEST FINDINGS Cardiovascular: Cardiomegaly. Coronary artery calcifications identified. The pulmonary arteries were better assessed on the recent CT a of the chest for PE. No pulmonary emboli in the central pulmonary arteries. The patient had a normal PE study earlier today as well. Atherosclerotic changes are seen in the thoracic aorta without dissection. The ascending thoracic aorta measures 4.2 cm on today's study, mildly aneurysmal. Mediastinum/Nodes: There is a stent in the left brachiocephalic vein which is well opacified on this study. The thyroid and esophagus are normal. A  borderline node anterior to the trachea measuring 14 mm on axial image 40 is stable since 2018, likely reactive. No other evidence of adenopathy identified. Small pleural effusions. No pericardial effusion. Mild increased attenuation in the subcutaneous fat suggests volume overload. Lungs/Pleura: Tiny pleural effusions. Central airways are normal. Mild atelectasis in the posterior left upper lobe. Mild atelectasis in the retrocardiac region, stable. No pneumothorax. Central airways are unchanged no suspicious pulmonary nodules, masses, or focal infiltrates. Musculoskeletal: There is a healed anterior left rib fracture. No acute bony abnormalities. Review of the MIP images confirms the above findings. CTA ABDOMEN AND PELVIS FINDINGS VASCULAR Aorta: Atherosclerotic change is seen in the nonaneurysmal abdominal aorta, extending into the iliac and femoral vessels. No dissection. Celiac: Patent without evidence of aneurysm,  dissection, vasculitis or significant stenosis. SMA: Atherosclerotic changes near the origin of the SMA without significant stenosis. No other abnormalities. Renals: Atherosclerosis at the origin of the bilateral renal arteries is identified. IMA: Patent without evidence of aneurysm, dissection, vasculitis or significant stenosis. Inflow: Patent without evidence of aneurysm, dissection, vasculitis or significant stenosis. Veins: No obvious venous abnormality within the limitations of this arterial phase study. Review of the MIP images confirms the above findings. NON-VASCULAR Hepatobiliary: No focal liver abnormality is seen. No gallstones, gallbladder wall thickening, or biliary dilatation. Pancreas: Unremarkable. No pancreatic ductal dilatation or surrounding inflammatory changes. Spleen: Normal in size without focal abnormality. Adrenals/Urinary Tract: Adrenal glands are normal. The kidneys are atrophic consistent with renal disease. No hydronephrosis or acute perinephric stranding. The ureters are  normal. The bladder is poorly distended but unremarkable. Stomach/Bowel: The stomach and small bowel are unremarkable. The colon is normal. The appendix is unremarkable. Lymphatic: No significant vascular findings are present. No enlarged abdominal or pelvic lymph nodes. Reproductive: Prostate is unremarkable. Other: There is increased attenuation in the subcutaneous fat and in the intra-abdominal fat consistent volume overload. There is a small amount of fluid in the right pericolic gutter. Some of this fluid is adjacent to the appendix but there is no convincing evidence of appendicitis. Musculoskeletal: No acute or significant osseous findings. Review of the MIP images confirms the above findings. IMPRESSION: 1. 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 This is stable since 2018. No dissection. Atherosclerotic changes noted. Recommend annual imaging followup by CTA or MRA. This recommendation follows 2010 ACCF/AHA/AATS/ACR/ASA/SCA/SCAI/SIR/STS/SVM Guidelines for the Diagnosis and Management of Patients with Thoracic Aortic Disease. Circulation. 2010; 121: O130-Q657. Aortic aneurysm NOS (ICD10-I71.9) 2. Cardiomegaly.  Coronary artery calcifications. 3. Increased attenuation in the subcutaneous fat diffusely as well as in the intra-abdominal fat with a small amount of fluid in the right pericolic gutter. These findings are most consistent with volume overload. 4. Tiny bilateral pleural effusions. Electronically Signed   By: Dorise Bullion III M.D   On: 08/01/2018 11:36    EKG: Orders placed or performed during the hospital encounter of 07/27/2018  . EKG 12-Lead  . EKG 12-Lead  . ED EKG  . ED EKG  . ED EKG 12-Lead  . ED EKG 12-Lead  . Repeat EKG  . Repeat EKG  . EKG 12-Lead  . EKG 12-Lead  . EKG 12-Lead  . EKG 12-Lead    ACUTE A FLUTTER WITH CHEST PAIN H/o COCAINE ABUSE, patient states he has been using COCAINE Follow CE, EKG Follow up Cardiology  recs Vasopressors if needed    Corrin Parker, M.D.  [facility]  Medical Director [place] Director [facility] Cardio-Pulmonary Department

## 2018-08-05 NOTE — Consult Note (Signed)
CODE SEPSIS - PHARMACY COMMUNICATION  **Broad Spectrum Antibiotics should be administered within 1 hour of Sepsis diagnosis**  Time Code Sepsis Called/Page Received: 0718  Antibiotics Ordered: cefepime, flagyl, and vancomycin  Time of 1st antibiotic administration: 0756  Additional action taken by pharmacy: Messaged RN.   If necessary, Name of Provider/Nurse Contacted: Lorrie   Oswald Hillock ,PharmD, BCPS Clinical Pharmacist  07/24/2018  8:06 AM

## 2018-08-05 NOTE — ED Notes (Signed)
Date and time results received: 08/22/2018    Test: troponin Critical Value: 0.13  Name of Provider Notified: Issacs  Orders Received? Or Actions Taken?: MD notified

## 2018-08-05 NOTE — ED Provider Notes (Signed)
[facility] Emergency Department Provider Note  ____________________________________________   First MD Initiated Contact with Patient 07/31/2018 [phone]     (approximate)  I have reviewed the triage vital signs and the nursing notes.   HISTORY  Chief Complaint Chest Pain    HPI Rushi Chasen is a 63 y.o. male with extensive past medical history including coronary artery disease, chronic heart failure, end-stage renal disease, peripheral vascular disease, pulmonary hypertension, history of GI bleed, here with cough, shortness of breath.  Patient states that he went to a full session of dialysis yesterday.  However, they only pulled off 2 to 3 L due to low blood pressure.  Since then, he has had persistent generalized weakness, as well as subjective fevers, chills, and now worsening cough.  The last 12 hours, he has had persistently worsening, dry cough, with associated severe shortness of breath.  Obvious arrival, he was systolic in the 28M, with increased work of breathing and sats in 88-89.  He is placed on 2 L.  He feels mildly improved.  He states he feels generally weak and tired.  He denies any known sick contacts, but has been going to dialysis.  He lives alone.  He states he has a anterior chest wall pain is worse after coughing.  Denies any other complaints.  He does note increased lower extremity edema.  He endorses dry mouth.  No other medical complaints at this time.        Past Medical History:  Diagnosis Date   Arthritis    CAD (coronary artery disease)    a. 09/2013 [facility]): basal inf defect more pronounced @ rest, likely artifact, EF 48%, low risk study; b. 06/2016 [facility]): LAD 10p, 30-59m, 40-50d, D1 90(small), D2 40, D3 60-70, D4 30, LCX 10p, 11m, 20d, Om1 30, OM3 40, RCA 100p w/ bridging collats, m/d RCA fill via collats, small/mod caliber-->Med Rx; c. 02/2017 MV: inf infarct w/ peri-inf ischemia. EF <30%.    Chronic combined systolic and  diastolic CHF (congestive heart failure) ([place])    a. 10/2013 Echo [facility]): EF 50%, diast dysfxn;  b. 10/2014 Echo: EF 30-35%, diff HK, gr1 DD;  c. 04/2016 Echo: EF 20-25%, sev dil LV, diff HK, gr3 DD; d. 06/2016 Echo: EF 30-35%, Gr2 DD.   Diabetes ([place])    Dyspnea    ESRD (end stage renal disease) ([place])    a. MWF dialysis @ [facility].   Hepatitis    Patient is unsure type of Hepatitis he has   Hypertension    Hypertensive heart disease with CHF (congestive heart failure) ([place])    Mixed Ischemic and non-ischemic cardiomyopathy    a. 10/2013 Echo [facility]): EF 50%, diast dysfxn;  b. 10/2014 Echo: EF 30-35%, diff HK, gr1 DD;  c. 04/2016 Echo: EF 20-25%, sev dil LV, diff HK, gr3 DD; d. 06/2016 Echo: EF 30-35%, Gr2 DD.   Moderate Aortic insufficiency    a. 04/2016 Echo: Mod AI.   Myocardial infarction [facility])    Peripheral vascular disease ([place])    Pulmonary hypertension ([place])    a. 10/2014 Echo: Mod-Sev PAH.   PVC's (premature ventricular contractions)    a. 02/2017 24 hr holter: freq polymorphic PVCs w/ total of 38K beats in 24 hrs (34% burden).   Renal insufficiency     Patient Active Problem List   Diagnosis Date Noted   Hypotension 08/12/2018   Demand ischemia [facility])    Gastric polyp    Duodenal ulcer  hemorrhage    Hematemesis with nausea    GIB (gastrointestinal bleeding) 05/03/2018   GI bleed 04/18/2018   Tachycardia 03/13/2017   Acute respiratory failure with hypoxia ([place]) 65/04/5463   Chronic systolic heart failure ([place]) 09/08/2016   Stricture of vein 07/12/2016   Hyperkalemia 07/04/2016   Abnormal EKG 07/04/2016   Elevated troponin    Chest pain 05/22/2016   Sepsis ([place]) 02/26/2016   HCAP (healthcare-associated pneumonia) 02/26/2016   Diabetes ([place]) 02/26/2016   Community acquired pneumonia 01/07/2016   Renal dialysis device, implant, or graft complication 68/01/7516   Tobacco use 09/04/2015   ESRD (end stage renal disease) on dialysis ([place])  08/02/2015   HTN (hypertension) 08/02/2015   Pulmonary edema 12/02/2014    Past Surgical History:  Procedure Laterality Date   A/V FISTULAGRAM Left 04/06/2016   Procedure: A/V Fistulagram;  Surgeon: Katha Cabal, MD;  Location: [place] CV LAB;  Service: Cardiovascular;  Laterality: Left;   A/V FISTULAGRAM Left 01/19/2017   Procedure: A/V FISTULAGRAM;  Surgeon: Katha Cabal, MD;  Location: [place] CV LAB;  Service: Cardiovascular;  Laterality: Left;   A/V SHUNT INTERVENTION N/A 04/06/2016   Procedure: A/V Shunt Intervention;  Surgeon: Katha Cabal, MD;  Location: [place] CV LAB;  Service: Cardiovascular;  Laterality: N/A;   A/V SHUNT INTERVENTION N/A 06/29/2016   Procedure: A/V Shunt Intervention;  Surgeon: Katha Cabal, MD;  Location: [place] CV LAB;  Service: Cardiovascular;  Laterality: N/A;   AV FISTULA PLACEMENT Left 2014   AV FISTULA PLACEMENT Left 06/01/2017   Procedure: INSERTION OF ARTERIOVENOUS (AV) GORE-TEX GRAFT ARM ( BRACHIAL AXILLARY );  Surgeon: Katha Cabal, MD;  Location: [facility] ORS;  Service: Vascular;  Laterality: Left;   DIALYSIS/PERMA CATHETER INSERTION Right    Dr. Delana Meyer   DIALYSIS/PERMA CATHETER REMOVAL N/A 07/05/2017   Procedure: DIALYSIS/PERMA CATHETER REMOVAL;  Surgeon: Katha Cabal, MD;  Location: [place] CV LAB;  Service: Cardiovascular;  Laterality: N/A;   EMBOLIZATION N/A 05/09/2018   Procedure: EMBOLIZATION (Duodenal Ulceration);  Surgeon: Katha Cabal, MD;  Location: [place] CV LAB;  Service: Cardiovascular;  Laterality: N/A;   ESOPHAGOGASTRODUODENOSCOPY N/A 05/04/2018   Procedure: ESOPHAGOGASTRODUODENOSCOPY (EGD);  Surgeon: Virgel Manifold, MD;  Location: [facility] ENDOSCOPY;  Service: Gastroenterology;  Laterality: N/A;   ESOPHAGOGASTRODUODENOSCOPY (EGD) WITH PROPOFOL N/A 04/20/2018   Procedure: ESOPHAGOGASTRODUODENOSCOPY (EGD) WITH PROPOFOL;  Surgeon: Jonathon Bellows, MD;   Location: [facility]COPY;  Service: Gastroenterology;  Laterality: N/A;   PERIPHERAL VASCULAR CATHETERIZATION N/A 10/03/2014   Procedure: A/V Shuntogram/Fistulagram;  Surgeon: Algernon Huxley, MD;  Location: [place] CV LAB;  Service: Cardiovascular;  Laterality: N/A;   PERIPHERAL VASCULAR CATHETERIZATION Left 10/03/2014   Procedure: A/V Shunt Intervention;  Surgeon: Algernon Huxley, MD;  Location: [place] CV LAB;  Service: Cardiovascular;  Laterality: Left;   PERIPHERAL VASCULAR CATHETERIZATION Left 05/01/2015   Procedure: A/V Shuntogram/Fistulagram;  Surgeon: Algernon Huxley, MD;  Location: [place] CV LAB;  Service: Cardiovascular;  Laterality: Left;   PERIPHERAL VASCULAR CATHETERIZATION N/A 05/01/2015   Procedure: A/V Shunt Intervention;  Surgeon: Algernon Huxley, MD;  Location: [place] CV LAB;  Service: Cardiovascular;  Laterality: N/A;   UPPER EXTREMITY ANGIOGRAPHY Bilateral 06/29/2016   Procedure: Upper Extremity Angiography;  Surgeon: Katha Cabal, MD;  Location: [place] CV LAB;  Service: Cardiovascular;  Laterality: Bilateral;   UPPER EXTREMITY INTERVENTION  06/29/2016   Procedure: Upper Extremity Intervention;  Surgeon: Katha Cabal, MD;  Location:  [place] CV LAB;  Service: Cardiovascular;;   UPPER EXTREMITY VENOGRAPHY Left 03/15/2017   Procedure: UPPER EXTREMITY VENOGRAPHY;  Surgeon: Katha Cabal, MD;  Location: [place] CV LAB;  Service: Cardiovascular;  Laterality: Left;    Prior to Admission medications   Medication Sig Start Date End Date Taking? Authorizing Provider  atorvastatin (LIPITOR) 40 MG tablet Take 40 mg by mouth daily. 07/26/18  Yes [provider]  carvedilol (COREG) 3.125 MG tablet Take 3.125 mg by mouth 2 (two) times a day. 07/27/18  Yes [provider]  ENTRESTO 24-26 MG Take 1 tablet by mouth 2 (two) times a day. 07/26/18  Yes [provider]  furosemide (LASIX) 40 MG tablet Take 40 mg by mouth 2 (two)  times a day. 07/26/18  Yes [provider]  hydrALAZINE (APRESOLINE) 100 MG tablet Take 100 mg by mouth daily. 07/26/18  Yes [provider]  isosorbide mononitrate (IMDUR) 30 MG 24 hr tablet Take 30 mg by mouth daily. 07/26/18  Yes [provider]  pantoprazole (PROTONIX) 40 MG tablet Take 40 mg by mouth 2 (two) times a day. 07/26/18  Yes [provider]  ceFEPIme 500 mg in dextrose 5 % 50 mL Inject 500 mg into the vein daily. 05/11/18   Bradly Bienenstock, NP  chlorhexidine (PERIDEX) 0.12 % solution 15 mLs by Mouth Rinse route 2 (two) times daily. 05/10/18   Bradly Bienenstock, NP  Chlorhexidine Gluconate Cloth 2 % PADS Apply 6 each topically daily. 05/10/18   Bradly Bienenstock, NP  epoetin alfa (EPOGEN,PROCRIT) 99357 UNIT/ML injection Inject 0.4 mLs (4,000 Units total) into the vein every Monday, Wednesday, and Friday with hemodialysis. 05/10/18   Darel Hong D, NP  haloperidol lactate (HALDOL) 5 MG/ML injection Inject 0.2 mLs (1 mg total) into the vein every 6 (six) hours as needed (for agitated delirium). 05/10/18   Darel Hong D, NP  ipratropium-albuterol (DUONEB) 0.5-2.5 (3) MG/3ML SOLN Take 3 mLs by nebulization every 4 (four) hours as needed. 05/10/18   Bradly Bienenstock, NP  LORazepam (ATIVAN) 2 MG/ML injection Inject 0.25 mLs (0.5 mg total) into the vein every 4 (four) hours as needed for anxiety. 05/10/18   Darel Hong D, NP  morphine 2 MG/ML injection Inject 0.5 mLs (1 mg total) into the vein every 2 (two) hours as needed (chest pain). 05/10/18   Bradly Bienenstock, NP  mouth rinse LIQD solution 15 mLs by Mouth Rinse route 2 times daily at 12 noon and 4 pm. 05/10/18   Bradly Bienenstock, NP  nitroGLYCERIN (NITROSTAT) 0.4 MG SL tablet Place 1 tablet (0.4 mg total) under the tongue every 5 (five) minutes as needed for chest pain. 05/10/18   Bradly Bienenstock, NP  ondansetron (ZOFRAN) 4 MG/2ML SOLN injection Inject 2 mLs (4 mg total) into the vein every 6 (six) hours  as needed for nausea. 05/10/18   Bradly Bienenstock, NP  ondansetron [facility]) 4 MG/2ML SOLN injection Inject 2 mLs (4 mg total) into the vein every 6 (six) hours as needed for nausea, vomiting or refractory nausea / vomiting. 05/10/18   Bradly Bienenstock, NP  pantoprazole (PROTONIX) 40 MG injection Inject 40 mg into the vein every 12 (twelve) hours. 05/13/18   Darel Hong D, NP  pantoprazole 80 mg in sodium chloride 0.9 % 250 mL Inject 8 mg/hr into the vein continuous. 05/10/18   Darel Hong D, NP  phenylephrine 10 mg in sodium chloride 0.9 % 250 mL Inject  0-400 mcg/min into the vein continuous. 05/10/18   Bradly Bienenstock, NP  traMADol (ULTRAM) 50 MG tablet Take 1 tablet (50 mg total) by mouth every 6 (six) hours as needed for moderate pain or severe pain. 04/21/18   Nicholes Mango, MD  Vancomycin (VANCOCIN) 750-5 MG/150ML-% SOLN Inject 150 mLs (750 mg total) into the vein every Monday, Wednesday, and Friday with hemodialysis. 05/10/18   Bradly Bienenstock, NP  vitamin C (VITAMIN C) 500 MG tablet Take 1 tablet (500 mg total) by mouth 2 (two) times daily. 05/10/18   Bradly Bienenstock, NP    Allergies Sulfa antibiotics  Family History  Problem Relation Age of Onset   Hypertension Son    Diabetes Son    Hypertension Mother    Diabetes Mother    Diabetes Sister     Social History Social History   Tobacco Use   Smoking status: Former Smoker    Packs/day: 0.25    Years: 40.00    Pack years: 10.00    Types: Cigarettes    Quit date: 10/12/2016    Years since quitting: 1.8   Smokeless tobacco: Never Used  Substance Use Topics   Alcohol use: No    Alcohol/week: 0.0 standard drinks   Drug use: Yes    Types: Cocaine, Marijuana    Comment: patient states no drugs    Review of Systems   Review of Systems  Constitutional: Positive for chills, fatigue and fever.  HENT: Negative for congestion and rhinorrhea.   Eyes: Negative for visual disturbance.  Respiratory: Positive for  cough, shortness of breath and wheezing.   Cardiovascular: Positive for leg swelling.  Gastrointestinal: Positive for nausea. Negative for abdominal pain, diarrhea and vomiting.  Genitourinary: Negative for dysuria and flank pain.  Skin: Negative for rash and wound.  Neurological: Positive for weakness. Negative for light-headedness.  All other systems reviewed and are negative.    ____________________________________________  PHYSICAL EXAM:      VITAL SIGNS: ED Triage Vitals  Enc Vitals Group     BP 07/28/2018 0707 (!) 88/64     Pulse Rate 07/27/2018 0707 (!) 118     Resp 08/10/2018 0707 (!) 24     Temp 08/18/2018 0707 99.1 F (37.3 C)     Temp Source 08/15/2018 0707 Oral     SpO2 08/16/2018 0707 96 %     Weight 08/07/2018 0708 170 lb (77.1 kg)     Height 08/10/2018 0708 5\' 7"  (1.702 m)     Head Circumference --      Peak Flow --      Pain Score 08/17/2018 0707 6     Pain Loc --      Pain Edu? --      Excl. in [place]? --      Physical Exam Vitals signs and nursing note reviewed.  Constitutional:      General: He is in acute distress.     Appearance: He is well-developed.  HENT:     Head: Normocephalic and atraumatic.     Mouth/Throat:     Mouth: Mucous membranes are dry.  Eyes:     Conjunctiva/sclera: Conjunctivae normal.  Neck:     Musculoskeletal: Neck supple.  Cardiovascular:     Rate and Rhythm: Normal rate and regular rhythm.     Heart sounds: Normal heart sounds. No murmur. No friction rub.  Pulmonary:     Effort: Tachypnea and respiratory distress present.     Breath sounds: Normal breath  sounds. No wheezing or rales.     Comments: Frequent, nonproductive coughing Chest:     Comments: Mild tenderness over anterior chest wall Abdominal:     General: There is no distension.     Palpations: Abdomen is soft.     Tenderness: There is no abdominal tenderness.  Musculoskeletal:     Right lower leg: Edema present.     Left lower leg: Edema present.  Skin:    General: Skin is  warm.     Capillary Refill: Capillary refill takes less than 2 seconds.  Neurological:     Mental Status: He is alert and oriented to person, place, and time.     Motor: No abnormal muscle tone.       ____________________________________________   LABS (all labs ordered are listed, but only abnormal results are displayed)  Labs Reviewed  TROPONIN I - Abnormal; Notable for the following components:      Result Value   Troponin I 0.13 (*)    All other components within normal limits  CBC WITH DIFFERENTIAL/PLATELET - Abnormal; Notable for the following components:   RBC 3.60 (*)    Hemoglobin 10.1 (*)    HCT 31.6 (*)    RDW 19.1 (*)    Monocytes Absolute 1.8 (*)    All other components within normal limits  COMPREHENSIVE METABOLIC PANEL - Abnormal; Notable for the following components:   BUN 45 (*)    Creatinine, Ser 5.27 (*)    Calcium 8.3 (*)    Albumin 3.4 (*)    GFR calc non Af Amer 11 (*)    GFR calc Af Amer 12 (*)    All other components within normal limits  BRAIN NATRIURETIC PEPTIDE - Abnormal; Notable for the following components:   B Natriuretic Peptide 2,373.0 (*)    All other components within normal limits  TROPONIN I - Abnormal; Notable for the following components:   Troponin I 0.14 (*)    All other components within normal limits  TROPONIN I - Abnormal; Notable for the following components:   Troponin I 0.11 (*)    All other components within normal limits  SARS CORONAVIRUS 2 (HOSPITAL ORDER, [place] [facility])  MRSA PCR SCREENING  CULTURE, BLOOD (ROUTINE X 2)  CULTURE, BLOOD (ROUTINE X 2)  LACTIC ACID, PLASMA  GLUCOSE, CAPILLARY  URINE DRUG SCREEN, QUALITATIVE ([facility] ONLY)  TROPONIN I  TROPONIN I  RAPID URINE DRUG SCREEN, HOSP PERFORMED  TSH  CBC  BASIC METABOLIC PANEL    ____________________________________________  EKG: Sinus tachycardia, ventricular rate 115.  PR 128, QRS 100, QTc 433.  Diffuse, nonspecific ST segment  changes concerning for possible demand ischemia.  No overt ST elevations or depressions. ________________________________________  RADIOLOGY All imaging, including plain films, CT scans, and ultrasounds, independently reviewed by me, and interpretations confirmed via formal radiology reads.  ED MD interpretation:    CXR: Cardiomegaly, no overt edema or consolidation. CT Angio: Neg for PE, atelectasis noted, no focal PNA. Aneurysmal dilatation of prox Ao. CT Dissection: No dissection  Official radiology report(s): Dg Chest 2 View  Result Date: 08/10/2018 CLINICAL DATA:  New cough and shortness of breath EXAM: CHEST - 2 VIEW COMPARISON:  May 10, 2018 FINDINGS: Vascular stent projects over the left subclavian region. Stable cardiomegaly. The hila, mediastinum, lungs, and pleura are normal. IMPRESSION: Persistent cardiomegaly.  No other abnormalities. Electronically Signed   By: Dorise Bullion III M.D   On: 08/20/2018 08:00   Ct  Angio Chest Pe W And/or Wo Contrast  Result Date: 08/17/2018 CLINICAL DATA:  Chest pain. EXAM: CT ANGIOGRAPHY CHEST WITH CONTRAST TECHNIQUE: Multidetector CT imaging of the chest was performed using the standard protocol during bolus administration of intravenous contrast. Multiplanar CT image reconstructions and MIPs were obtained to evaluate the vascular anatomy. CONTRAST:  33mL OMNIPAQUE IOHEXOL 350 MG/ML SOLN COMPARISON:  Chest x-ray August 05, 2018.  Chest CT Jul 04, 2016. FINDINGS: Cardiovascular: Cardiomegaly is noted. A stent is seen in the left brachiocephalic vein. Patency cannot be evaluated due to lack of contrast in these vessels due to a right-sided injection. Atherosclerotic changes are seen in the aorta. The ascending thoracic aorta measures up to 4.4 cm, unchanged since May of 2018. No dissection identified. The main pulmonary artery measures 3.7 cm. No pulmonary emboli. Mediastinum/Nodes: The thyroid and esophagus are normal. No adenopathy. Tiny pleural  effusions are noted. No pericardial effusion. Lungs/Pleura: Left retrocardiac atelectasis is noted. No other suspicious infiltrates. Central airways are normal. No pneumothorax. Upper Abdomen: No acute abnormality. Musculoskeletal: No chest wall abnormality. No acute or significant osseous findings. Review of the MIP images confirms the above findings. IMPRESSION: 1. No pulmonary emboli identified. 2. Cardiomegaly.  Tiny pleural effusions. 3. There is a stent in the left brachiocephalic vein. Patency cannot be evaluated due to a right-sided injection. 4. Atherosclerotic changes in the aorta. 5. 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 Recommend annual imaging followup by CTA or MRA. This recommendation follows 2010 ACCF/AHA/AATS/ACR/ASA/SCA/SCAI/SIR/STS/SVM Guidelines for the Diagnosis and Management of Patients with Thoracic Aortic Disease. Circulation. 2010; 121: F026-V785. Aortic aneurysm NOS (ICD10-I71.9) 6. The main pulmonary artery measures 3.7 cm which raises the possibility of pulmonary arterial hypertension. 7. Mild left basilar atelectasis in the retrocardiac region. Aortic Atherosclerosis (ICD10-I70.0). Electronically Signed   By: Dorise Bullion III M.D   On: 08/16/2018 10:29   Ct Angio Chest/abd/pel For Dissection W And/or Wo Contrast  Result Date: 07/28/2018 CLINICAL DATA:  Chest pain for 4 days.  Cough. EXAM: CT ANGIOGRAPHY CHEST, ABDOMEN AND PELVIS TECHNIQUE: Multidetector CT imaging through the chest, abdomen and pelvis was performed using the standard protocol during bolus administration of intravenous contrast. Multiplanar reconstructed images and MIPs were obtained and reviewed to evaluate the vascular anatomy. CONTRAST:  1104mL ISOVUE-370 IOPAMIDOL (ISOVUE-370) INJECTION 76% COMPARISON:  CT of the chest August 05, 2018. CT of the abdomen and pelvis May 04, 2018 FINDINGS: CTA CHEST FINDINGS Cardiovascular: Cardiomegaly. Coronary artery calcifications  identified. The pulmonary arteries were better assessed on the recent CT a of the chest for PE. No pulmonary emboli in the central pulmonary arteries. The patient had a normal PE study earlier today as well. Atherosclerotic changes are seen in the thoracic aorta without dissection. The ascending thoracic aorta measures 4.2 cm on today's study, mildly aneurysmal. Mediastinum/Nodes: There is a stent in the left brachiocephalic vein which is well opacified on this study. The thyroid and esophagus are normal. A borderline node anterior to the trachea measuring 14 mm on axial image 40 is stable since 2018, likely reactive. No other evidence of adenopathy identified. Small pleural effusions. No pericardial effusion. Mild increased attenuation in the subcutaneous fat suggests volume overload. Lungs/Pleura: Tiny pleural effusions. Central airways are normal. Mild atelectasis in the posterior left upper lobe. Mild atelectasis in the retrocardiac region, stable. No pneumothorax. Central airways are unchanged no suspicious pulmonary nodules, masses, or focal infiltrates. Musculoskeletal: There is a healed anterior left rib fracture. No  acute bony abnormalities. Review of the MIP images confirms the above findings. CTA ABDOMEN AND PELVIS FINDINGS VASCULAR Aorta: Atherosclerotic change is seen in the nonaneurysmal abdominal aorta, extending into the iliac and femoral vessels. No dissection. Celiac: Patent without evidence of aneurysm, dissection, vasculitis or significant stenosis. SMA: Atherosclerotic changes near the origin of the SMA without significant stenosis. No other abnormalities. Renals: Atherosclerosis at the origin of the bilateral renal arteries is identified. IMA: Patent without evidence of aneurysm, dissection, vasculitis or significant stenosis. Inflow: Patent without evidence of aneurysm, dissection, vasculitis or significant stenosis. Veins: No obvious venous abnormality within the limitations of this arterial  phase study. Review of the MIP images confirms the above findings. NON-VASCULAR Hepatobiliary: No focal liver abnormality is seen. No gallstones, gallbladder wall thickening, or biliary dilatation. Pancreas: Unremarkable. No pancreatic ductal dilatation or surrounding inflammatory changes. Spleen: Normal in size without focal abnormality. Adrenals/Urinary Tract: Adrenal glands are normal. The kidneys are atrophic consistent with renal disease. No hydronephrosis or acute perinephric stranding. The ureters are normal. The bladder is poorly distended but unremarkable. Stomach/Bowel: The stomach and small bowel are unremarkable. The colon is normal. The appendix is unremarkable. Lymphatic: No significant vascular findings are present. No enlarged abdominal or pelvic lymph nodes. Reproductive: Prostate is unremarkable. Other: There is increased attenuation in the subcutaneous fat and in the intra-abdominal fat consistent volume overload. There is a small amount of fluid in the right pericolic gutter. Some of this fluid is adjacent to the appendix but there is no convincing evidence of appendicitis. Musculoskeletal: No acute or significant osseous findings. Review of the MIP images confirms the above findings. IMPRESSION: 1. 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 This is stable since 2018. No dissection. Atherosclerotic changes noted. Recommend annual imaging followup by CTA or MRA. This recommendation follows 2010 ACCF/AHA/AATS/ACR/ASA/SCA/SCAI/SIR/STS/SVM Guidelines for the Diagnosis and Management of Patients with Thoracic Aortic Disease. Circulation. 2010; 121: F638-G665. Aortic aneurysm NOS (ICD10-I71.9) 2. Cardiomegaly.  Coronary artery calcifications. 3. Increased attenuation in the subcutaneous fat diffusely as well as in the intra-abdominal fat with a small amount of fluid in the right pericolic gutter. These findings are most consistent with volume overload. 4.  Tiny bilateral pleural effusions. Electronically Signed   By: Dorise Bullion III M.D   On: 08/02/2018 11:36    ____________________________________________  PROCEDURES   Procedure(s) performed (including Critical Care):  .Critical Care Performed by: Duffy Bruce, MD Authorized by: Duffy Bruce, MD   Critical care provider statement:    Critical care time (minutes):  75   Critical care time was exclusive of:  Separately billable procedures and treating other patients and teaching time   Critical care was necessary to treat or prevent imminent or life-threatening deterioration of the following conditions:  Circulatory failure, cardiac failure, respiratory failure and sepsis   Critical care was time spent personally by me on the following activities:  Development of treatment plan with patient or surrogate, discussions with consultants, evaluation of patient's response to treatment, examination of patient, obtaining history from patient or surrogate, ordering and performing treatments and interventions, ordering and review of laboratory studies, ordering and review of radiographic studies, pulse oximetry, re-evaluation of patient's condition and review of old charts   I assumed direction of critical care for this patient from another provider in my specialty: no      ____________________________________________  INITIAL IMPRESSION / MDM / [place] / ED COURSE  As part of my medical decision  making, I reviewed the following data within the [facility] from prior ED visits and [place] Controlled Substance Database      *Murle Otting was evaluated in Emergency Department on 08/14/2018 for the symptoms described in the history of present illness. He was evaluated in the context of the global COVID-19 pandemic, which necessitated consideration that the patient might be at risk for infection with the SARS-CoV-2 virus that causes COVID-19. Institutional  protocols and algorithms that pertain to the evaluation of patients at risk for COVID-19 are in a state of rapid change based on information released by regulatory bodies including the CDC and federal and state organizations. These policies and algorithms were followed during the patient's care in the ED.  Some ED evaluations and interventions may be delayed as a result of limited staffing during the pandemic.*   Clinical Course as of Aug 04 1713  Sat Jun 13, 729  1275 64 year old male here with severe chest pain, hypotension, and hypoxia.  Initial consideration includes pneumonia, sepsis, coronavirus, CHF, pulmonary edema from ESRD, and STEMI/ACS.  He does appear to have sinus tachycardia versus ectopic atrial rhythm which appears new.  No ST elevations.  He is afebrile, but tachycardic and hypotensive, so code sepsis initiated.  He does report that he is below his expected weight, so will give very cautious fluids to improve his pressures, but will moved pressors very quickly in the setting of his end-stage dialysis status.  Initial labs reviewed, are consistent with likely demand ischemia, elevated BNP, but overall reassuring electrolytes and CBC.  Lactic acid is negative.  Coronavirus is also negative interestingly.  Unclear whether this is an STEMI versus anginal pain, and he does have history of cocaine abuse.  Will check a UDS, though he produces very minimal urine.  Sent for CT angios well, given his hypoxia, which fortunately shows no evidence of PE.  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 Jimmye Norman of radiology and sent for CT dissection as well given his persistent hypotension.  I see no evidence of overt dissection on my review of CT.  Will plan to discuss with cardiology, admit to ICU.   [CI]    Clinical Course User Index [CI] Duffy Bruce, MD    Medical Decision Making: 63 year old male with very complex past medical history as above here with  acute hypoxic respiratory failure and what I suspect is new onset atrial flutter with variable block.  Patient is hypotensive here, query primary hypotension secondary to his cardiac arrhythmia, versus occult sepsis and he was activated as a code sepsis.  He does appear actually intravascularly dry so was given cautious fluids and antibiotics.  I discussed the case with both nephrology as well as Dr. Rockey Situ of cardiology, who has seen and will make recommendations regarding rate control.  Given his complex history of recurrent GI bleeds, holding on anticoagulation at this time pending repeat troponin.  ____________________________________________  FINAL CLINICAL IMPRESSION(S) / ED DIAGNOSES  Final diagnoses:  Acute respiratory failure with hypoxia (HCC)  Atypical atrial flutter (HCC)  ESRD on dialysis ([place])  Hypotension, unspecified hypotension type     MEDICATIONS GIVEN DURING THIS VISIT:  Medications  atorvastatin (LIPITOR) tablet 40 mg (40 mg Oral Given 07/25/2018 1601)  ipratropium-albuterol (DUONEB) 0.5-2.5 (3) MG/3ML nebulizer solution 3 mL (has no administration in time range)  heparin injection 5,000 Units (5,000 Units Subcutaneous Given 08/22/2018 1602)  sodium chloride flush (NS) 0.9 % injection 3 mL (  3 mLs Intravenous Given 08/19/2018 1605)  sodium chloride flush (NS) 0.9 % injection 3 mL (has no administration in time range)  0.9 %  sodium chloride infusion (has no administration in time range)  acetaminophen (TYLENOL) tablet 650 mg (has no administration in time range)    Or  acetaminophen (TYLENOL) suppository 650 mg (has no administration in time range)  ondansetron (ZOFRAN) tablet 4 mg (has no administration in time range)    Or  ondansetron (ZOFRAN) injection 4 mg (has no administration in time range)  aspirin EC tablet 81 mg (81 mg Oral Given 08/09/2018 1601)  ceFEPIme (MAXIPIME) 2 g in sodium chloride 0.9 % 100 mL IVPB (has no administration in time range)  vancomycin (VANCOCIN)  IVPB 750 mg/150 ml premix (has no administration in time range)  morphine 2 MG/ML injection 2-4 mg (2 mg Intravenous Given 07/30/2018 1439)  sodium chloride 0.9 % bolus 500 mL (0 mLs Intravenous Stopped 08/04/2018 0942)  ceFEPIme (MAXIPIME) 2 g in sodium chloride 0.9 % 100 mL IVPB (0 g Intravenous Stopped 08/19/2018 0826)  metroNIDAZOLE (FLAGYL) IVPB 500 mg ( Intravenous Stopped 08/07/2018 0908)  vancomycin (VANCOCIN) IVPB 1000 mg/200 mL premix ( Intravenous Stopped 07/29/2018 0908)  benzocaine (HURRICAINE) 20 % mouth spray ( Mouth/Throat Given by Other 08/06/2018 0832)  acetaminophen (TYLENOL) tablet 650 mg (650 mg Oral Given 08/14/2018 0932)  albuterol (VENTOLIN HFA) 108 (90 Base) MCG/ACT inhaler 2 puff (2 puffs Inhalation Given 07/24/2018 0933)  iohexol (OMNIPAQUE) 350 MG/ML injection 75 mL (75 mLs Intravenous Contrast Given 08/11/2018 0940)  fentaNYL (SUBLIMAZE) injection 25 mcg (25 mcg Intravenous Given 08/17/2018 1007)  sodium chloride 0.9 % bolus 250 mL (0 mLs Intravenous Stopped 08/22/2018 1207)  famotidine (PEPCID) IVPB 20 mg premix (0 mg Intravenous Stopped 08/18/2018 1207)  iopamidol (ISOVUE-370) 76 % injection 100 mL (100 mLs Intravenous Contrast Given 08/16/2018 1102)  pantoprazole (PROTONIX) injection 40 mg (40 mg Intravenous Given 07/29/2018 1150)  fentaNYL (SUBLIMAZE) injection 25 mcg (25 mcg Intravenous Given 07/24/2018 1244)  vancomycin (VANCOCIN) IVPB 750 mg/150 ml premix (750 mg Intravenous New Bag/Given 07/29/2018 1500)  digoxin (LANOXIN) 0.25 MG/ML injection 0.25 mg (0.25 mg Intravenous Given 08/12/2018 1530)     ED Discharge Orders    None       Note:  This document was prepared using Dragon voice recognition software and may include unintentional dictation errors.   Duffy Bruce, MD 08/02/2018 1715

## 2018-08-06 ENCOUNTER — Inpatient Hospital Stay (HOSPITAL_COMMUNITY)
Admit: 2018-08-06 | Discharge: 2018-08-06 | Disposition: A | Discharge status: Home / Self Care | Payer: Medicare Other | Attending: Internal Medicine | Admitting: Internal Medicine

## 2018-08-06 ENCOUNTER — Other Ambulatory Visit: Payer: Self-pay

## 2018-08-06 DIAGNOSIS — I351 Nonrheumatic aortic (valve) insufficiency: Secondary | ICD-10-CM

## 2018-08-06 DIAGNOSIS — F1410 Cocaine abuse, uncomplicated: Secondary | ICD-10-CM

## 2018-08-06 DIAGNOSIS — I361 Nonrheumatic tricuspid (valve) insufficiency: Secondary | ICD-10-CM

## 2018-08-06 DIAGNOSIS — J9601 Acute respiratory failure with hypoxia: Secondary | ICD-10-CM

## 2018-08-06 LAB — BASIC METABOLIC PANEL
Anion gap: 16 — ABNORMAL HIGH (ref 5–15)
Anion gap: 16 — ABNORMAL HIGH (ref 5–15)
BUN: 55 mg/dL — ABNORMAL HIGH (ref 8–23)
BUN: 57 mg/dL — ABNORMAL HIGH (ref 8–23)
CO2: 19 mmol/L — ABNORMAL LOW (ref 22–32)
CO2: 19 mmol/L — ABNORMAL LOW (ref 22–32)
Calcium: 8.2 mg/dL — ABNORMAL LOW (ref 8.9–10.3)
Calcium: 8.4 mg/dL — ABNORMAL LOW (ref 8.9–10.3)
Chloride: 100 mmol/L (ref 98–111)
Chloride: 101 mmol/L (ref 98–111)
Creatinine, Ser: 6.14 mg/dL — ABNORMAL HIGH (ref 0.61–1.24)
Creatinine, Ser: 6.31 mg/dL — ABNORMAL HIGH (ref 0.61–1.24)
GFR calc Af Amer: 10 mL/min — ABNORMAL LOW (ref >60)
GFR calc Af Amer: 10 mL/min — ABNORMAL LOW (ref >60)
GFR calc non Af Amer: 9 mL/min — ABNORMAL LOW (ref >60)
GFR calc non Af Amer: 9 mL/min — ABNORMAL LOW (ref >60)
Glucose, Bld: 88 mg/dL (ref 70–99)
Glucose, Bld: 80 mg/dL (ref 70–99)
Potassium: 4.8 mmol/L (ref 3.5–5.1)
Potassium: 5 mmol/L (ref 3.5–5.1)
Sodium: 135 mmol/L (ref 135–145)
Sodium: 136 mmol/L (ref 135–145)

## 2018-08-06 LAB — CBC
HCT: 32.9 % — ABNORMAL LOW (ref 39.0–52.0)
Hemoglobin: 10.4 g/dL — ABNORMAL LOW (ref 13.0–17.0)
MCH: 27.8 pg (ref 26.0–34.0)
MCHC: 31.6 g/dL (ref 30.0–36.0)
MCV: 88 fL (ref 80.0–100.0)
Platelets: 114 10*3/uL — ABNORMAL LOW (ref 150–400)
RBC: 3.74 MIL/uL — ABNORMAL LOW (ref 4.22–5.81)
RDW: 19.5 % — ABNORMAL HIGH (ref 11.5–15.5)
WBC: 5.9 10*3/uL (ref 4.0–10.5)
nRBC: 0.3 % — ABNORMAL HIGH (ref 0.0–0.2)

## 2018-08-06 LAB — TROPONIN I
Troponin I: 0.18 ng/mL (ref <0.03)
Troponin I: 0.22 ng/mL (ref <0.03)

## 2018-08-06 LAB — ECHOCARDIOGRAM COMPLETE
Height: 68 in
Weight: 2652.57 oz

## 2018-08-06 LAB — MAGNESIUM
Magnesium: 1.7 mg/dL (ref 1.7–2.4)
Magnesium: 1.7 mg/dL (ref 1.7–2.4)

## 2018-08-06 LAB — PHOSPHORUS
Phosphorus: 9.9 mg/dL — ABNORMAL HIGH (ref 2.5–4.6)
Phosphorus: 10.1 mg/dL — ABNORMAL HIGH (ref 2.5–4.6)

## 2018-08-06 MED ORDER — SODIUM CHLORIDE 0.9 % IV BOLUS
1000.0000 mL | Freq: Once | INTRAVENOUS | Status: AC
  Administered 2018-08-06: 1000 mL via INTRAVENOUS

## 2018-08-06 MED ORDER — GUAIFENESIN 100 MG/5ML PO SOLN
5.0000 mL | ORAL | Status: DC | PRN
  Administered 2018-08-06 (×2): 100 mg via ORAL
  Filled 2018-08-06 (×3): qty 5

## 2018-08-06 MED ORDER — ORAL CARE MOUTH RINSE
15.0000 mL | Freq: Two times a day (BID) | OROMUCOSAL | Status: DC
  Administered 2018-08-06 – 2018-08-10 (×7): 15 mL via OROMUCOSAL

## 2018-08-06 MED ORDER — NITROGLYCERIN 0.4 MG SL SUBL
SUBLINGUAL_TABLET | SUBLINGUAL | Status: AC
  Administered 2018-08-06: 03:00:00 0.4 mg via SUBLINGUAL
  Filled 2018-08-06: qty 1

## 2018-08-06 MED ORDER — DOCUSATE SODIUM 100 MG PO CAPS
100.0000 mg | ORAL_CAPSULE | Freq: Every day | ORAL | Status: DC
  Administered 2018-08-06 – 2018-08-09 (×4): 100 mg via ORAL
  Filled 2018-08-06 (×4): qty 1

## 2018-08-06 MED ORDER — MIDODRINE HCL 5 MG PO TABS
10.0000 mg | ORAL_TABLET | Freq: Three times a day (TID) | ORAL | Status: DC
  Administered 2018-08-06 – 2018-08-09 (×9): 10 mg via ORAL
  Filled 2018-08-06 (×22): qty 2

## 2018-08-06 MED ORDER — SENNOSIDES-DOCUSATE SODIUM 8.6-50 MG PO TABS
1.0000 | ORAL_TABLET | Freq: Two times a day (BID) | ORAL | Status: DC | PRN
  Administered 2018-08-06: 1 via ORAL
  Filled 2018-08-06: qty 1

## 2018-08-06 MED ORDER — DIPHENHYDRAMINE HCL 25 MG PO CAPS
25.0000 mg | ORAL_CAPSULE | Freq: Four times a day (QID) | ORAL | Status: DC | PRN
  Administered 2018-08-06 – 2018-08-08 (×2): 25 mg via ORAL
  Filled 2018-08-06 (×3): qty 1

## 2018-08-06 MED ORDER — NITROGLYCERIN 0.4 MG SL SUBL
0.4000 mg | SUBLINGUAL_TABLET | SUBLINGUAL | Status: DC | PRN
  Administered 2018-08-06: 0.4 mg via SUBLINGUAL

## 2018-08-06 NOTE — Progress Notes (Signed)
[place] at [facility]                                                                                                                                                                                  Patient Demographics   [name], is a 63 y.o. male, DOB - 08-11-1955, ALP:[id number]  Admit date - 08/08/2018   Admitting Physician Dustin Flock, MD  Outpatient Primary MD for the patient is Aycock, Edmonia Lynch, MD   LOS - 1  Subjective: Patient continues to complaint of chest pain dizziness Blood pressure continues to be low   Review of Systems:   CONSTITUTIONAL: No documented fever. No fatigue, weakness. No weight gain, no weight loss.  EYES: No blurry or double vision.  ENT: No tinnitus. No postnasal drip. No redness of the oropharynx.  RESPIRATORY: No cough, no wheeze, no hemoptysis. No dyspnea.  CARDIOVASCULAR: Positive chest pain. No orthopnea. No palpitations. No syncope.  GASTROINTESTINAL: No nausea, no vomiting or diarrhea. No abdominal pain. No melena or hematochezia.  GENITOURINARY: No dysuria or hematuria.  ENDOCRINE: No polyuria or nocturia. No heat or cold intolerance.  HEMATOLOGY: No anemia. No bruising. No bleeding.  INTEGUMENTARY: No rashes. No lesions.  MUSCULOSKELETAL: No arthritis. No swelling. No gout.  NEUROLOGIC: No numbness, tingling, or ataxia. No seizure-type activity.  PSYCHIATRIC: No anxiety. No insomnia. No ADD.    Vitals:   Vitals:   08/06/18 0925 08/06/18 1000 08/06/18 1100 08/06/18 1200  BP: (!) 69/34 (!) 75/59 (!) 85/63 (!) 88/70  Pulse: 64 74    Resp: 17 (!) 8 15 20   Temp:      TempSrc:      SpO2: 97% 98%    Weight:      Height:        Wt Readings from Last 3 Encounters:  08/15/2018 75.2 kg  05/09/18 78.3 kg  04/21/18 74.5 kg     Intake/Output Summary (Last 24 hours) at 08/06/2018 1251 Last data filed at 08/06/2018 1007 Gross per 24 hour  Intake 1489 ml  Output -  Net 1489 ml    Physical Exam:    GENERAL: Pleasant-appearing in no apparent distress.  HEAD, EYES, EARS, NOSE AND THROAT: Atraumatic, normocephalic. Extraocular muscles are intact. Pupils equal and reactive to light. Sclerae anicteric. No conjunctival injection. No oro-pharyngeal erythema.  NECK: Supple. There is no jugular venous distention. No bruits, no lymphadenopathy, no thyromegaly.  HEART: Regular rate and rhythm,. No murmurs, no rubs, no clicks.  LUNGS: Clear to auscultation bilaterally. No rales or rhonchi. No wheezes.  ABDOMEN: Soft, flat, nontender, nondistended. Has good bowel sounds. No hepatosplenomegaly appreciated.  EXTREMITIES: No evidence of any  cyanosis, clubbing, or peripheral edema.  +2 pedal and radial pulses bilaterally.  NEUROLOGIC: The patient is alert, awake, and oriented x3 with no focal motor or sensory deficits appreciated bilaterally.  SKIN: Moist and warm with no rashes appreciated.  Psych: Not anxious, depressed LN: No inguinal LN enlargement    Antibiotics   Anti-infectives (From admission, onward)   Start     Dose/Rate Route Frequency Ordered Stop   08/07/18 1200  ceFEPIme (MAXIPIME) 2 g in sodium chloride 0.9 % 100 mL IVPB     2 g 200 mL/hr over 30 Minutes Intravenous Every M-W-F (Hemodialysis) 08/14/2018 1250     08/07/18 1200  vancomycin (VANCOCIN) IVPB 750 mg/150 ml premix     750 mg 150 mL/hr over 60 Minutes Intravenous Every M-W-F (Hemodialysis) 08/20/2018 1250     07/26/2018 1415  vancomycin (VANCOCIN) IVPB 750 mg/150 ml premix     750 mg 150 mL/hr over 60 Minutes Intravenous  Once 07/31/2018 1250 07/28/2018 1600   07/31/2018 0730  ceFEPIme (MAXIPIME) 2 g in sodium chloride 0.9 % 100 mL IVPB     2 g 200 mL/hr over 30 Minutes Intravenous  Once 08/03/2018 0715 08/20/2018 0826   08/09/2018 0730  metroNIDAZOLE (FLAGYL) IVPB 500 mg     500 mg 100 mL/hr over 60 Minutes Intravenous  Once 08/01/2018 0715 08/01/2018 0908   08/08/2018 0730  vancomycin (VANCOCIN) IVPB 1000 mg/200 mL premix     1,000  mg 200 mL/hr over 60 Minutes Intravenous  Once 07/30/2018 0715 07/29/2018 0908      Medications   Scheduled Meds: . aspirin EC  81 mg Oral Daily  . atorvastatin  40 mg Oral Daily  . benzonatate  100 mg Oral TID  . docusate sodium  100 mg Oral Daily  . heparin  5,000 Units Subcutaneous Q8H  . mouth rinse  15 mL Mouth Rinse BID  . midodrine  10 mg Oral TID WC  . sodium chloride flush  3 mL Intravenous Q12H   Continuous Infusions: . sodium chloride    . [START ON 08/07/2018] ceFEPime (MAXIPIME) IV    . [START ON 08/07/2018] vancomycin     PRN Meds:.sodium chloride, acetaminophen **OR** acetaminophen, diphenhydrAMINE, guaiFENesin, ipratropium-albuterol, morphine injection, nitroGLYCERIN, ondansetron **OR** ondansetron (ZOFRAN) IV, sodium chloride flush   Data Review:   Micro Results Recent Results (from the past 240 hour(s))  Blood culture (routine x 2)     Status: None (Preliminary result)   Collection Time: 08/04/2018  7:46 AM   Specimen: BLOOD  Result Value Ref Range Status   Specimen Description BLOOD RFA  Final   Special Requests   Final    BOTTLES DRAWN AEROBIC AND ANAEROBIC Blood Culture results may not be optimal due to an excessive volume of blood received in culture bottles   Culture   Final    NO GROWTH < 24 HOURS Performed at [facility], [address] [place], [place] [address]    Report Status PENDING  Incomplete  Blood culture (routine x 2)     Status: None (Preliminary result)   Collection Time: 08/06/2018  7:47 AM   Specimen: BLOOD  Result Value Ref Range Status   Specimen Description BLOOD R AC  Final   Special Requests   Final    BOTTLES DRAWN AEROBIC AND ANAEROBIC Blood Culture results may not be optimal due to an excessive volume of blood received in culture bottles   Culture   Final    NO GROWTH <  15 HOURS Performed at [facility], [address] [place], [place] [address]    Report Status PENDING  Incomplete  SARS Coronavirus 2  (CEPHEID- Performed in [place] hospital lab), Hosp Order     Status: None   Collection Time: 08/22/2018  8:34 AM   Specimen: Nasopharyngeal Swab  Result Value Ref Range Status   SARS Coronavirus 2 NEGATIVE NEGATIVE Final    Comment: (NOTE) If result is NEGATIVE SARS-CoV-2 target nucleic acids are NOT DETECTED. The SARS-CoV-2 RNA is generally detectable in upper and lower  respiratory specimens during the acute phase of infection. The lowest  concentration of SARS-CoV-2 viral copies this assay can detect is 250  copies / mL. A negative result does not preclude SARS-CoV-2 infection  and should not be used as the sole basis for treatment or other  patient management decisions.  A negative result may occur with  improper specimen collection / handling, submission of specimen other  than nasopharyngeal swab, presence of viral mutation(s) within the  areas targeted by this assay, and inadequate number of viral copies  (<250 copies / mL). A negative result must be combined with clinical  observations, patient history, and epidemiological information. If result is POSITIVE SARS-CoV-2 target nucleic acids are DETECTED. The SARS-CoV-2 RNA is generally detectable in upper and lower  respiratory specimens dur ing the acute phase of infection.  Positive  results are indicative of active infection with SARS-CoV-2.  Clinical  correlation with patient history and other diagnostic information is  necessary to determine patient infection status.  Positive results do  not rule out bacterial infection or co-infection with other viruses. If result is PRESUMPTIVE POSTIVE SARS-CoV-2 nucleic acids MAY BE PRESENT.   A presumptive positive result was obtained on the submitted specimen  and confirmed on repeat testing.  While 2019 novel coronavirus  (SARS-CoV-2) nucleic acids may be present in the submitted sample  additional confirmatory testing may be necessary for epidemiological  and / or clinical  management purposes  to differentiate between  SARS-CoV-2 and other Sarbecovirus currently known to infect humans.  If clinically indicated additional testing with an alternate test  methodology [phone]) is advised. The SARS-CoV-2 RNA is generally  detectable in upper and lower respiratory sp ecimens during the acute  phase of infection. The expected result is Negative. Fact Sheet for Patients:  StrictlyIdeas.no Fact Sheet for Healthcare Providers: BankingDealers.co.za This test is not yet approved or cleared by the [place] FDA and has been authorized for detection and/or diagnosis of SARS-CoV-2 by FDA under an Emergency Use Authorization (EUA).  This EUA will remain in effect (meaning this test can be used) for the duration of the COVID-19 declaration under Section 564(b)(1) of the Act, 21 U.S.C. section 360bbb-3(b)(1), unless the authorization is terminated or revoked sooner. Performed at [facility], [address] [place], [place] [address]   MRSA PCR Screening     Status: None   Collection Time: 07/29/2018  1:50 PM   Specimen: Nasal Mucosa; Nasopharyngeal  Result Value Ref Range Status   MRSA by PCR NEGATIVE NEGATIVE Final    Comment:        The GeneXpert MRSA Assay (FDA approved for NASAL specimens only), is one component of a comprehensive MRSA colonization surveillance program. It is not intended to diagnose MRSA infection nor to guide or monitor treatment for MRSA infections. Performed at [facility], [address], [place], [place] [address]     Radiology Reports Dg Chest 2  View  Result Date: 08/18/2018 CLINICAL DATA:  New cough and shortness of breath EXAM: CHEST - 2 VIEW COMPARISON:  May 10, 2018 FINDINGS: Vascular stent projects over the left subclavian region. Stable cardiomegaly. The hila, mediastinum, lungs, and pleura are normal. IMPRESSION: Persistent cardiomegaly.  No other  abnormalities. Electronically Signed   By: Dorise Bullion III M.D   On: 07/24/2018 08:00   Ct Angio Chest Pe W And/or Wo Contrast  Result Date: 08/11/2018 CLINICAL DATA:  Chest pain. EXAM: CT ANGIOGRAPHY CHEST WITH CONTRAST TECHNIQUE: Multidetector CT imaging of the chest was performed using the standard protocol during bolus administration of intravenous contrast. Multiplanar CT image reconstructions and MIPs were obtained to evaluate the vascular anatomy. CONTRAST:  72mL OMNIPAQUE IOHEXOL 350 MG/ML SOLN COMPARISON:  Chest x-ray August 05, 2018.  Chest CT Jul 04, 2016. FINDINGS: Cardiovascular: Cardiomegaly is noted. A stent is seen in the left brachiocephalic vein. Patency cannot be evaluated due to lack of contrast in these vessels due to a right-sided injection. Atherosclerotic changes are seen in the aorta. The ascending thoracic aorta measures up to 4.4 cm, unchanged since May of 2018. No dissection identified. The main pulmonary artery measures 3.7 cm. No pulmonary emboli. Mediastinum/Nodes: The thyroid and esophagus are normal. No adenopathy. Tiny pleural effusions are noted. No pericardial effusion. Lungs/Pleura: Left retrocardiac atelectasis is noted. No other suspicious infiltrates. Central airways are normal. No pneumothorax. Upper Abdomen: No acute abnormality. Musculoskeletal: No chest wall abnormality. No acute or significant osseous findings. Review of the MIP images confirms the above findings. IMPRESSION: 1. No pulmonary emboli identified. 2. Cardiomegaly.  Tiny pleural effusions. 3. There is a stent in the left brachiocephalic vein. Patency cannot be evaluated due to a right-sided injection. 4. Atherosclerotic changes in the aorta. 5. 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 Recommend annual imaging followup by CTA or MRA. This recommendation follows 2010 ACCF/AHA/AATS/ACR/ASA/SCA/SCAI/SIR/STS/SVM Guidelines for the Diagnosis and Management of  Patients with Thoracic Aortic Disease. Circulation. 2010; 121: N989-Q119. Aortic aneurysm NOS (ICD10-I71.9) 6. The main pulmonary artery measures 3.7 cm which raises the possibility of pulmonary arterial hypertension. 7. Mild left basilar atelectasis in the retrocardiac region. Aortic Atherosclerosis (ICD10-I70.0). Electronically Signed   By: Dorise Bullion III M.D   On: 08/09/2018 10:29   Ct Angio Chest/abd/pel For Dissection W And/or Wo Contrast  Result Date: 08/14/2018 CLINICAL DATA:  Chest pain for 4 days.  Cough. EXAM: CT ANGIOGRAPHY CHEST, ABDOMEN AND PELVIS TECHNIQUE: Multidetector CT imaging through the chest, abdomen and pelvis was performed using the standard protocol during bolus administration of intravenous contrast. Multiplanar reconstructed images and MIPs were obtained and reviewed to evaluate the vascular anatomy. CONTRAST:  152mL ISOVUE-370 IOPAMIDOL (ISOVUE-370) INJECTION 76% COMPARISON:  CT of the chest August 05, 2018. CT of the abdomen and pelvis May 04, 2018 FINDINGS: CTA CHEST FINDINGS Cardiovascular: Cardiomegaly. Coronary artery calcifications identified. The pulmonary arteries were better assessed on the recent CT a of the chest for PE. No pulmonary emboli in the central pulmonary arteries. The patient had a normal PE study earlier today as well. Atherosclerotic changes are seen in the thoracic aorta without dissection. The ascending thoracic aorta measures 4.2 cm on today's study, mildly aneurysmal. Mediastinum/Nodes: There is a stent in the left brachiocephalic vein which is well opacified on this study. The thyroid and esophagus are normal. A borderline node anterior to the trachea measuring 14 mm on axial image 40 is stable since 2018, likely reactive. No  other evidence of adenopathy identified. Small pleural effusions. No pericardial effusion. Mild increased attenuation in the subcutaneous fat suggests volume overload. Lungs/Pleura: Tiny pleural effusions. Central airways are  normal. Mild atelectasis in the posterior left upper lobe. Mild atelectasis in the retrocardiac region, stable. No pneumothorax. Central airways are unchanged no suspicious pulmonary nodules, masses, or focal infiltrates. Musculoskeletal: There is a healed anterior left rib fracture. No acute bony abnormalities. Review of the MIP images confirms the above findings. CTA ABDOMEN AND PELVIS FINDINGS VASCULAR Aorta: Atherosclerotic change is seen in the nonaneurysmal abdominal aorta, extending into the iliac and femoral vessels. No dissection. Celiac: Patent without evidence of aneurysm, dissection, vasculitis or significant stenosis. SMA: Atherosclerotic changes near the origin of the SMA without significant stenosis. No other abnormalities. Renals: Atherosclerosis at the origin of the bilateral renal arteries is identified. IMA: Patent without evidence of aneurysm, dissection, vasculitis or significant stenosis. Inflow: Patent without evidence of aneurysm, dissection, vasculitis or significant stenosis. Veins: No obvious venous abnormality within the limitations of this arterial phase study. Review of the MIP images confirms the above findings. NON-VASCULAR Hepatobiliary: No focal liver abnormality is seen. No gallstones, gallbladder wall thickening, or biliary dilatation. Pancreas: Unremarkable. No pancreatic ductal dilatation or surrounding inflammatory changes. Spleen: Normal in size without focal abnormality. Adrenals/Urinary Tract: Adrenal glands are normal. The kidneys are atrophic consistent with renal disease. No hydronephrosis or acute perinephric stranding. The ureters are normal. The bladder is poorly distended but unremarkable. Stomach/Bowel: The stomach and small bowel are unremarkable. The colon is normal. The appendix is unremarkable. Lymphatic: No significant vascular findings are present. No enlarged abdominal or pelvic lymph nodes. Reproductive: Prostate is unremarkable. Other: There is increased  attenuation in the subcutaneous fat and in the intra-abdominal fat consistent volume overload. There is a small amount of fluid in the right pericolic gutter. Some of this fluid is adjacent to the appendix but there is no convincing evidence of appendicitis. Musculoskeletal: No acute or significant osseous findings. Review of the MIP images confirms the above findings. IMPRESSION: 1. 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 This is stable since 2018. No dissection. Atherosclerotic changes noted. Recommend annual imaging followup by CTA or MRA. This recommendation follows 2010 ACCF/AHA/AATS/ACR/ASA/SCA/SCAI/SIR/STS/SVM Guidelines for the Diagnosis and Management of Patients with Thoracic Aortic Disease. Circulation. 2010; 121: O536-U440. Aortic aneurysm NOS (ICD10-I71.9) 2. Cardiomegaly.  Coronary artery calcifications. 3. Increased attenuation in the subcutaneous fat diffusely as well as in the intra-abdominal fat with a small amount of fluid in the right pericolic gutter. These findings are most consistent with volume overload. 4. Tiny bilateral pleural effusions. Electronically Signed   By: Dorise Bullion III M.D   On: 07/31/2018 11:36     CBC Recent Labs  Lab 08/01/2018 0710 08/06/18 0045  WBC 7.3 5.9  HGB 10.1* 10.4*  HCT 31.6* 32.9*  PLT 150 114*  MCV 87.8 88.0  MCH 28.1 27.8  MCHC 32.0 31.6  RDW 19.1* 19.5*  LYMPHSABS 0.8  --   MONOABS 1.8*  --   EOSABS 0.1  --   BASOSABS 0.1  --     Chemistries  Recent Labs  Lab 08/20/2018 0710 08/06/18 0045 08/06/18 0303  NA 141 135 136  K 4.4 4.8 5.0  CL 105 100 101  CO2 23 19* 19*  GLUCOSE 78 88 80  BUN 45* 55* 57*  CREATININE 5.27* 6.14* 6.31*  CALCIUM 8.3* 8.2* 8.4*  MG  --  1.7 1.7  AST 26  --   --   ALT 21  --   --   ALKPHOS 81  --   --   BILITOT 1.0  --   --     ------------------------------------------------------------------------------------------------------------------ estimated creatinine clearance is 11.4 mL/min (A) (by C-G formula based on SCr of 6.31 mg/dL (H)). ------------------------------------------------------------------------------------------------------------------ No results for input(s): HGBA1C in the last 72 hours. ------------------------------------------------------------------------------------------------------------------ No results for input(s): CHOL, HDL, LDLCALC, TRIG, CHOLHDL, LDLDIRECT in the last 72 hours. ------------------------------------------------------------------------------------------------------------------ Recent Labs    07/26/2018 1833  TSH 2.410   ------------------------------------------------------------------------------------------------------------------ No results for input(s): VITAMINB12, FOLATE, FERRITIN, TIBC, IRON, RETICCTPCT in the last 72 hours.  Coagulation profile No results for input(s): INR, PROTIME in the last 168 hours.  No results for input(s): DDIMER in the last 72 hours.  Cardiac Enzymes Recent Labs  Lab 08/10/2018 1833 08/06/18 0045 08/06/18 0303  TROPONINI 0.11* 0.18* 0.22*   ------------------------------------------------------------------------------------------------------------------ Invalid input(s): POCBNP    Assessment & Plan   IMPRESSION AND PLAN: Patient 63 year old with multiple medical problems presenting with chest pain and hypotension  1.  Hypotension etiology unclear Started patient on midodrine Continue antibiotic Blood cultures negative  2.  Atrial flutter with elevated heart rate now heart rate much improved   3.  Chest pain etiology is likely due to cocaine induced vasospasm  4.  End-stage renal disease fluid removal per nephrology  5.  Diabetes type 2 continue sliding-scale insulin  6.  Hyperlipidemia continue Lipitor  7.   Miscellaneous heparin for DVT prophylaxis  7.  Chronic systolic and diastolic CHF Fluid removal per nephrology      Code Status Orders  (From admission, onward)         Start     Ordered   08/04/2018 1507  Full code  Continuous     08/12/2018 1506        Code Status History    Date Active Date Inactive Code Status Order ID Comments User Context   05/03/2018 1843 05/10/2018 1609 Full Code [id number]  Bettey Costa, MD ED   04/18/2018 0420 04/22/2018 0122 Full Code [id number]  Harrie Foreman, MD Inpatient   02/08/2018 0233 02/08/2018 2214 Full Code [id number]  Lance Coon, MD ED   03/14/2017 0007 03/14/2017 2204 Full Code [id number]  Amelia Jo, MD Inpatient   02/15/2017 2136 02/18/2017 1812 Full Code [id number]  Demetrios Loll, MD Inpatient   09/20/2016 0318 09/21/2016 1915 Full Code [id number]  Lance Coon, MD ED   08/22/2016 1658 08/24/2016 1607 Full Code [id number]  Demetrios Loll, MD Inpatient   07/04/2016 1929 07/05/2016 1728 Full Code [id number]  Idelle Crouch, MD Inpatient   06/07/2016 0102 06/08/2016 1514 Full Code [id number]  Hugelmeyer, [place], DO Inpatient   04/30/2016 0007 05/01/2016 1821 Full Code [id number]  Lance Coon, MD Inpatient   02/26/2016 2107 02/28/2016 1804 Full Code [id number]  Lance Coon, MD Inpatient   01/07/2016 0132 01/07/2016 1724 Full Code [id number]  Hugelmeyer, Alexis, DO ED   08/02/2015 2316 08/04/2015 2311 Full Code [id number]  Quintella Baton, MD Inpatient   12/02/2014 2052 12/04/2014 1753 Full Code [id number]  Vaughan Basta, MD Inpatient   11/10/2014 2323 11/12/2014 1506 Full Code [id number]  Gladstone Lighter, MD Inpatient   Advance Care Planning Activity           Consults   Nephrology, pulmonary critical   DVT Prophylaxis  scd's  Lab Results  Component Value Date   PLT 114 (L) 08/06/2018     Time Spent in minutes  19min  Greater than 50% of time spent in care coordination and counseling patient regarding the condition and plan of  care.   Dustin Flock M.D on 08/06/2018 at 12:51 PM  Between 7am to 6pm - Pager - [phone]  After 6pm go to [email or web address] - Proofreader  [employer]   Office  [phone]

## 2018-08-06 NOTE — Progress Notes (Signed)
Pt daughter,Tiara, called and updated.

## 2018-08-06 NOTE — Progress Notes (Addendum)
Advanced care plan.  Purpose of the Encounter: CODE STATUS  Parties in Attendance: Patient himself   Patient's Decision Capacity: Intact  Subjective/Patient's story:  [name]  is a 63 y.o. male with a known history of multiple medical problems including end-stage renal disease, coronary artery disease, chronic combined systolic and diastolic CHF, diabetes type 2,, essential hypertension, peripheral vascular disease,ESRD, pulmonary hypertension who is presenting to the hospital with complaint of chest pain.  Patient with history of cocaine abuse  Objective/Medical story  Patient has had multiple admissions for multiple problems.  I discussed with the patient regarding his desires for cardiac and pulmonary resuscitation.  I asked him about living will and healthcare power of attorney  Goals of care determination:  Patient states that he would like everything to be done and wants to be a full code does not have a living will or healthcare power of attorney   CODE STATUS: Full code   Time spent discussing advanced care planning: 16 minutes

## 2018-08-06 NOTE — Progress Notes (Signed)
Pt having c/o increasing chest pain and tightness at 0300, NP notified and orders placed for troponin, EKG, and 1x sublingual nitro.

## 2018-08-06 NOTE — Progress Notes (Signed)
Pt with c/o dizziness, diaphoresis. BP 69/34 (46). Manuel reck 67/48. Rhythm flipped back into 3:1 A Flutter. Dr Mortimer Fries made aware->1L NS bolus ordered.

## 2018-08-06 NOTE — Progress Notes (Signed)
[facility]  ROUNDING NOTE   Subjective:  Patient due for dialysis again tomorrow. Still in the critical care unit and having some mild chest pain. Apparently has used cocaine recently. Cardiology also following on the case.   Objective:  Vital signs in last 24 hours:  Temp:  [98 F (36.7 C)-98.6 F (37 C)] 98.6 F (37 C) (06/14 0800) Pulse Rate:  [64-113] 74 (06/14 1000) Resp:  [8-27] 20 (06/14 1200) BP: (69-112)/(34-88) 88/70 (06/14 1200) SpO2:  [89 %-99 %] 98 % (06/14 1000) FiO2 (%):  [28 %] 28 % (06/13 1507) Weight:  [75.2 kg] 75.2 kg (06/13 1346)  Weight change:  Filed Weights   08/04/2018 0708 08/11/2018 0804 07/27/2018 1346  Weight: 77.1 kg 77.6 kg 75.2 kg    Intake/Output: I/O last 3 completed shifts: In: 1382.5 [IV Piggyback:1382.5] Out: -    Intake/Output this shift:  Total I/O In: 3545 [P.O.:480; I.V.:10; IV Piggyback:999] Out: -   Physical Exam: General: Mild distress from pain  Head: Normocephalic, atraumatic. Moist oral mucosal membranes  Eyes: Anicteric  Neck: Supple, trachea midline  Lungs:  Clear to auscultation, normal effort  Heart: S1S2 no rubs  Abdomen:  Soft, nontender, bowel sounds present  Extremities: Trace peripheral edema.  Neurologic: Awake, alert, following commands  Skin: No lesions  Access: LUE AVF    Basic Metabolic Panel: Recent Labs  Lab 08/16/2018 0710 08/06/18 0045 08/06/18 0303  NA 141 135 136  K 4.4 4.8 5.0  CL 105 100 101  CO2 23 19* 19*  GLUCOSE 78 88 80  BUN 45* 55* 57*  CREATININE 5.27* 6.14* 6.31*  CALCIUM 8.3* 8.2* 8.4*  MG  --  1.7 1.7  PHOS  --  9.9* 10.1*    Liver Function Tests: Recent Labs  Lab 08/08/2018 0710  AST 26  ALT 21  ALKPHOS 81  BILITOT 1.0  PROT 7.4  ALBUMIN 3.4*   No results for input(s): LIPASE, AMYLASE in the last 168 hours. No results for input(s): AMMONIA in the last 168 hours.  CBC: Recent Labs  Lab 08/09/2018 0710 08/06/18 0045  WBC 7.3 5.9  NEUTROABS 4.5  --    HGB 10.1* 10.4*  HCT 31.6* 32.9*  MCV 87.8 88.0  PLT 150 114*    Cardiac Enzymes: Recent Labs  Lab 07/30/2018 1157 07/26/2018 1418 08/04/2018 1833 08/06/18 0045 08/06/18 0303  TROPONINI 0.14* 0.11* 0.11* 0.18* 0.22*    BNP: Invalid input(s): POCBNP  CBG: Recent Labs  Lab 07/28/2018 1346  GLUCAP 73    Microbiology: Results for orders placed or performed during the hospital encounter of 08/04/2018  Blood culture (routine x 2)     Status: None (Preliminary result)   Collection Time: 08/18/2018  7:46 AM   Specimen: BLOOD  Result Value Ref Range Status   Specimen Description BLOOD RFA  Final   Special Requests   Final    BOTTLES DRAWN AEROBIC AND ANAEROBIC Blood Culture results may not be optimal due to an excessive volume of blood received in culture bottles   Culture   Final    NO GROWTH < 24 HOURS Performed at [facility], [address] [place], [place] [address]    Report Status PENDING  Incomplete  Blood culture (routine x 2)     Status: None (Preliminary result)   Collection Time: 08/17/2018  7:47 AM   Specimen: BLOOD  Result Value Ref Range Status   Specimen Description BLOOD R [facility]  Final   Special Requests  Final    BOTTLES DRAWN AEROBIC AND ANAEROBIC Blood Culture results may not be optimal due to an excessive volume of blood received in culture bottles   Culture   Final    NO GROWTH < 24 HOURS Performed at [facility], [address] [place], [place] [address]    Report Status PENDING  Incomplete  SARS Coronavirus 2 (CEPHEID- Performed in [place] hospital lab), Hosp Order     Status: None   Collection Time: 08/07/2018  8:34 AM   Specimen: Nasopharyngeal Swab  Result Value Ref Range Status   SARS Coronavirus 2 NEGATIVE NEGATIVE Final    Comment: (NOTE) If result is NEGATIVE SARS-CoV-2 target nucleic acids are NOT DETECTED. The SARS-CoV-2 RNA is generally detectable in upper and lower  respiratory specimens during the acute phase of  infection. The lowest  concentration of SARS-CoV-2 viral copies this assay can detect is 250  copies / mL. A negative result does not preclude SARS-CoV-2 infection  and should not be used as the sole basis for treatment or other  patient management decisions.  A negative result may occur with  improper specimen collection / handling, submission of specimen other  than nasopharyngeal swab, presence of viral mutation(s) within the  areas targeted by this assay, and inadequate number of viral copies  (<250 copies / mL). A negative result must be combined with clinical  observations, patient history, and epidemiological information. If result is POSITIVE SARS-CoV-2 target nucleic acids are DETECTED. The SARS-CoV-2 RNA is generally detectable in upper and lower  respiratory specimens dur ing the acute phase of infection.  Positive  results are indicative of active infection with SARS-CoV-2.  Clinical  correlation with patient history and other diagnostic information is  necessary to determine patient infection status.  Positive results do  not rule out bacterial infection or co-infection with other viruses. If result is PRESUMPTIVE POSTIVE SARS-CoV-2 nucleic acids MAY BE PRESENT.   A presumptive positive result was obtained on the submitted specimen  and confirmed on repeat testing.  While 2019 novel coronavirus  (SARS-CoV-2) nucleic acids may be present in the submitted sample  additional confirmatory testing may be necessary for epidemiological  and / or clinical management purposes  to differentiate between  SARS-CoV-2 and other Sarbecovirus currently known to infect humans.  If clinically indicated additional testing with an alternate test  methodology [phone]) is advised. The SARS-CoV-2 RNA is generally  detectable in upper and lower respiratory sp ecimens during the acute  phase of infection. The expected result is Negative. Fact Sheet for Patients:   StrictlyIdeas.no Fact Sheet for Healthcare Providers: BankingDealers.co.za This test is not yet approved or cleared by the [place] FDA and has been authorized for detection and/or diagnosis of SARS-CoV-2 by FDA under an Emergency Use Authorization (EUA).  This EUA will remain in effect (meaning this test can be used) for the duration of the COVID-19 declaration under Section 564(b)(1) of the Act, 21 U.S.C. section 360bbb-3(b)(1), unless the authorization is terminated or revoked sooner. Performed at [facility], [address] [place], [place] [address]   MRSA PCR Screening     Status: None   Collection Time: 08/12/2018  1:50 PM   Specimen: Nasal Mucosa; Nasopharyngeal  Result Value Ref Range Status   MRSA by PCR NEGATIVE NEGATIVE Final    Comment:        The GeneXpert MRSA Assay (FDA approved for NASAL specimens only), is one component of a comprehensive MRSA colonization surveillance  program. It is not intended to diagnose MRSA infection nor to guide or monitor treatment for MRSA infections. Performed at [facility], [address] [place], [place] [address]     Coagulation Studies: No results for input(s): LABPROT, INR in the last 72 hours.  Urinalysis: No results for input(s): COLORURINE, LABSPEC, PHURINE, GLUCOSEU, HGBUR, BILIRUBINUR, KETONESUR, PROTEINUR, UROBILINOGEN, NITRITE, LEUKOCYTESUR in the last 72 hours.  Invalid input(s): APPERANCEUR    Imaging: Dg Chest 2 View  Result Date: 08/12/2018 CLINICAL DATA:  New cough and shortness of breath EXAM: CHEST - 2 VIEW COMPARISON:  May 10, 2018 FINDINGS: Vascular stent projects over the left subclavian region. Stable cardiomegaly. The hila, mediastinum, lungs, and pleura are normal. IMPRESSION: Persistent cardiomegaly.  No other abnormalities. Electronically Signed   By: Dorise Bullion III M.D   On: 08/12/2018 08:00   Ct Angio Chest Pe W And/or  Wo Contrast  Result Date: 08/15/2018 CLINICAL DATA:  Chest pain. EXAM: CT ANGIOGRAPHY CHEST WITH CONTRAST TECHNIQUE: Multidetector CT imaging of the chest was performed using the standard protocol during bolus administration of intravenous contrast. Multiplanar CT image reconstructions and MIPs were obtained to evaluate the vascular anatomy. CONTRAST:  45mL OMNIPAQUE IOHEXOL 350 MG/ML SOLN COMPARISON:  Chest x-ray August 05, 2018.  Chest CT Jul 04, 2016. FINDINGS: Cardiovascular: Cardiomegaly is noted. A stent is seen in the left brachiocephalic vein. Patency cannot be evaluated due to lack of contrast in these vessels due to a right-sided injection. Atherosclerotic changes are seen in the aorta. The ascending thoracic aorta measures up to 4.4 cm, unchanged since May of 2018. No dissection identified. The main pulmonary artery measures 3.7 cm. No pulmonary emboli. Mediastinum/Nodes: The thyroid and esophagus are normal. No adenopathy. Tiny pleural effusions are noted. No pericardial effusion. Lungs/Pleura: Left retrocardiac atelectasis is noted. No other suspicious infiltrates. Central airways are normal. No pneumothorax. Upper Abdomen: No acute abnormality. Musculoskeletal: No chest wall abnormality. No acute or significant osseous findings. Review of the MIP images confirms the above findings. IMPRESSION: 1. No pulmonary emboli identified. 2. Cardiomegaly.  Tiny pleural effusions. 3. There is a stent in the left brachiocephalic vein. Patency cannot be evaluated due to a right-sided injection. 4. Atherosclerotic changes in the aorta. 5. 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 Recommend annual imaging followup by CTA or MRA. This recommendation follows 2010 ACCF/AHA/AATS/ACR/ASA/SCA/SCAI/SIR/STS/SVM Guidelines for the Diagnosis and Management of Patients with Thoracic Aortic Disease. Circulation. 2010; 121: F818-E993. Aortic aneurysm NOS (ICD10-I71.9) 6. The main  pulmonary artery measures 3.7 cm which raises the possibility of pulmonary arterial hypertension. 7. Mild left basilar atelectasis in the retrocardiac region. Aortic Atherosclerosis (ICD10-I70.0). Electronically Signed   By: Dorise Bullion III M.D   On: 08/20/2018 10:29   Ct Angio Chest/abd/pel For Dissection W And/or Wo Contrast  Result Date: 08/07/2018 CLINICAL DATA:  Chest pain for 4 days.  Cough. EXAM: CT ANGIOGRAPHY CHEST, ABDOMEN AND PELVIS TECHNIQUE: Multidetector CT imaging through the chest, abdomen and pelvis was performed using the standard protocol during bolus administration of intravenous contrast. Multiplanar reconstructed images and MIPs were obtained and reviewed to evaluate the vascular anatomy. CONTRAST:  141mL ISOVUE-370 IOPAMIDOL (ISOVUE-370) INJECTION 76% COMPARISON:  CT of the chest August 05, 2018. CT of the abdomen and pelvis May 04, 2018 FINDINGS: CTA CHEST FINDINGS Cardiovascular: Cardiomegaly. Coronary artery calcifications identified. The pulmonary arteries were better assessed on the recent CT a of the chest for PE. No pulmonary emboli in the  central pulmonary arteries. The patient had a normal PE study earlier today as well. Atherosclerotic changes are seen in the thoracic aorta without dissection. The ascending thoracic aorta measures 4.2 cm on today's study, mildly aneurysmal. Mediastinum/Nodes: There is a stent in the left brachiocephalic vein which is well opacified on this study. The thyroid and esophagus are normal. A borderline node anterior to the trachea measuring 14 mm on axial image 40 is stable since 2018, likely reactive. No other evidence of adenopathy identified. Small pleural effusions. No pericardial effusion. Mild increased attenuation in the subcutaneous fat suggests volume overload. Lungs/Pleura: Tiny pleural effusions. Central airways are normal. Mild atelectasis in the posterior left upper lobe. Mild atelectasis in the retrocardiac region, stable. No  pneumothorax. Central airways are unchanged no suspicious pulmonary nodules, masses, or focal infiltrates. Musculoskeletal: There is a healed anterior left rib fracture. No acute bony abnormalities. Review of the MIP images confirms the above findings. CTA ABDOMEN AND PELVIS FINDINGS VASCULAR Aorta: Atherosclerotic change is seen in the nonaneurysmal abdominal aorta, extending into the iliac and femoral vessels. No dissection. Celiac: Patent without evidence of aneurysm, dissection, vasculitis or significant stenosis. SMA: Atherosclerotic changes near the origin of the SMA without significant stenosis. No other abnormalities. Renals: Atherosclerosis at the origin of the bilateral renal arteries is identified. IMA: Patent without evidence of aneurysm, dissection, vasculitis or significant stenosis. Inflow: Patent without evidence of aneurysm, dissection, vasculitis or significant stenosis. Veins: No obvious venous abnormality within the limitations of this arterial phase study. Review of the MIP images confirms the above findings. NON-VASCULAR Hepatobiliary: No focal liver abnormality is seen. No gallstones, gallbladder wall thickening, or biliary dilatation. Pancreas: Unremarkable. No pancreatic ductal dilatation or surrounding inflammatory changes. Spleen: Normal in size without focal abnormality. Adrenals/Urinary Tract: Adrenal glands are normal. The kidneys are atrophic consistent with renal disease. No hydronephrosis or acute perinephric stranding. The ureters are normal. The bladder is poorly distended but unremarkable. Stomach/Bowel: The stomach and small bowel are unremarkable. The colon is normal. The appendix is unremarkable. Lymphatic: No significant vascular findings are present. No enlarged abdominal or pelvic lymph nodes. Reproductive: Prostate is unremarkable. Other: There is increased attenuation in the subcutaneous fat and in the intra-abdominal fat consistent volume overload. There is a small amount  of fluid in the right pericolic gutter. Some of this fluid is adjacent to the appendix but there is no convincing evidence of appendicitis. Musculoskeletal: No acute or significant osseous findings. Review of the MIP images confirms the above findings. IMPRESSION: 1. 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 This is stable since 2018. No dissection. Atherosclerotic changes noted. Recommend annual imaging followup by CTA or MRA. This recommendation follows 2010 ACCF/AHA/AATS/ACR/ASA/SCA/SCAI/SIR/STS/SVM Guidelines for the Diagnosis and Management of Patients with Thoracic Aortic Disease. Circulation. 2010; 121: K354-S568. Aortic aneurysm NOS (ICD10-I71.9) 2. Cardiomegaly.  Coronary artery calcifications. 3. Increased attenuation in the subcutaneous fat diffusely as well as in the intra-abdominal fat with a small amount of fluid in the right pericolic gutter. These findings are most consistent with volume overload. 4. Tiny bilateral pleural effusions. Electronically Signed   By: Dorise Bullion III M.D   On: 08/20/2018 11:36     Medications:   . sodium chloride    . [START ON 08/07/2018] ceFEPime (MAXIPIME) IV    . [START ON 08/07/2018] vancomycin     . aspirin EC  81 mg Oral Daily  . atorvastatin  40 mg Oral Daily  . benzonatate  100 mg Oral TID  . docusate sodium  100 mg Oral Daily  . heparin  5,000 Units Subcutaneous Q8H  . mouth rinse  15 mL Mouth Rinse BID  . midodrine  10 mg Oral TID WC  . sodium chloride flush  3 mL Intravenous Q12H   sodium chloride, acetaminophen **OR** acetaminophen, diphenhydrAMINE, guaiFENesin, ipratropium-albuterol, morphine injection, nitroGLYCERIN, ondansetron **OR** ondansetron (ZOFRAN) IV, sodium chloride flush  Assessment/ Plan:  63 y.o. male end stage renal disease on hemodialysis, hypertension, hepatitis C, anemia, seizure disorder, COPD/tobacco abuse,substance abuse and alcohol abuse,secondary  hyperparathyroidism, chronic systolic CHF admitted with chest pain and atrial flutter.  [facility] MWF [address].Left arm AVG. 240 minutes/72kg  1.  ESRD on HD MWF.  Patient did undergo dialysis on Friday.  Blood pressure marginal at the moment.  No urgent indication for dialysis currently.  We will plan for dialysis again tomorrow.  2.  Anemia of chronic kidney disease.  Hemoglobin currently 10.4.  Consider starting the patient on Epogen with dialysis treatments.  3.  Secondary hyperparathyroidism.  Check intact PTH and phosphorus with dialysis treatment tomorrow.  4.  Chest pain/atrial flutter.  Heart rate currently 74 and regular.  Cardiology has been consulted.  Further plans per their recommendation.   LOS: 1 Marshell Rieger 6/14/202012:26 PM

## 2018-08-06 NOTE — Progress Notes (Signed)
CRITICAL VALUE ALERT  Critical Value:  Troponin: .22  Date & Time Notied: 08/06/18 0430  Provider Notified: Patria Mane, NP  Orders Received/Actions taken: No new orders at this time, Pt not candidate for heparin gtt d/t hx of GI bleed. Continue to monitor.

## 2018-08-06 NOTE — H&P (Signed)
MR#:  [id number] DATE OF BIRTH:  Nov 09, 1955 DATE OF ADMISSION:  08/01/2018 PRIMARY CARE PHYSICIAN: Donnie Coffin, MD  REQUESTING/REFERRING PHYSICIAN:   CHIEF COMPLAINT:   Follow up cocaine use  HISTORY OF PRESENT ILLNESS: +h/o COCAINE ABUSE BP stable low, chest pain from cocaine No plans for antocaoguation or cardiac cath at this time mop rhine as needed for pain Digoxin started  COVID-19 NEGATIVE: Acute COVID-19 infection ruled out by PCR.   Ok to transfer to gen med floor  BP 112/79   Pulse (!) 108   Temp 98.5 F (36.9 C) (Axillary)   Resp (!) 8   Ht 5\' 8"  (1.727 m)   Wt 75.2 kg   SpO2 93%   BMI 25.21 kg/m   Review of Systems:  Gen:  Denies  fever, sweats, chills weigh loss  HEENT: Denies blurred vision, double vision, ear pain, eye pain, hearing loss, nose bleeds, sore throat Cardiac:  +dizziness, +chest pain  Resp:   +cough, -  Gi: Denies swallowing difficulty, stomach pain, nausea or vomiting, diarrhea, constipation, bowel incontinence Gu:  Denies bladder incontinence, burning urine Ext:   Denies Joint pain, stiffness or swelling Skin: Denies  skin rash, easy bruising or bleeding or hives Endoc:  Denies polyuria, polydipsia , polyphagia or weight change Psych:   Denies depression, insomnia or hallucinations  Other:  All other systems negative  Physical Examination:   GENERAL:NAD, no fevers, chills, no weakness no fatigue HEAD: Normocephalic, atraumatic.  EYES: PERLA, EOMI No scleral icterus.  MOUTH: Moist mucosal membrane.  EAR, NOSE, THROAT: Clear without exudates. No external lesions.  NECK: Supple. No thyromegaly.  No JVD.  PULMONARY: CTA B/L no wheezing, rhonchi, crackles CARDIOVASCULAR: S1 and S2. Regular rate and rhythm. No murmurs GASTROINTESTINAL: Soft, nontender, nondistended. Positive bowel sounds.  MUSCULOSKELETAL: No swelling, clubbing, or edema.  NEUROLOGIC: No gross focal neurological deficits. 5/5 strength all extremities SKIN: No  ulceration, lesions, rashes, or cyanosis.  PSYCHIATRIC: Insight, judgment intact. -depression -anxiety ALL OTHER ROS ARE NEGATIVE      MEDICATIONS AT HOME:  Prior to Admission medications   Medication Sig Start Date End Date Taking? Authorizing Provider  atorvastatin (LIPITOR) 40 MG tablet Take 40 mg by mouth daily. 07/26/18  Yes [provider]  carvedilol (COREG) 3.125 MG tablet Take 3.125 mg by mouth 2 (two) times a day. 07/27/18  Yes [provider]  ENTRESTO 24-26 MG Take 1 tablet by mouth 2 (two) times a day. 07/26/18  Yes [provider]  furosemide (LASIX) 40 MG tablet Take 40 mg by mouth 2 (two) times a day. 07/26/18  Yes [provider]  hydrALAZINE (APRESOLINE) 100 MG tablet Take 100 mg by mouth daily. 07/26/18  Yes [provider]  isosorbide mononitrate (IMDUR) 30 MG 24 hr tablet Take 30 mg by mouth daily. 07/26/18  Yes [provider]  pantoprazole (PROTONIX) 40 MG tablet Take 40 mg by mouth 2 (two) times a day. 07/26/18  Yes [provider]  ceFEPIme 500 mg in dextrose 5 % 50 mL Inject 500 mg into the vein daily. 05/11/18   Bradly Bienenstock, NP  chlorhexidine (PERIDEX) 0.12 % solution 15 mLs by Mouth Rinse route 2 (two) times daily. 05/10/18   Bradly Bienenstock, NP  Chlorhexidine Gluconate Cloth 2 % PADS Apply 6 each topically daily. 05/10/18   Bradly Bienenstock, NP  epoetin alfa (EPOGEN,PROCRIT) 26378 UNIT/ML injection Inject 0.4 mLs (4,000 Units total) into the vein every Monday, Wednesday, and Friday with  hemodialysis. 05/10/18   Darel Hong D, NP  haloperidol lactate (HALDOL) 5 MG/ML injection Inject 0.2 mLs (1 mg total) into the vein every 6 (six) hours as needed (for agitated delirium). 05/10/18   Darel Hong D, NP  ipratropium-albuterol (DUONEB) 0.5-2.5 (3) MG/3ML SOLN Take 3 mLs by nebulization every 4 (four) hours as needed. 05/10/18   Bradly Bienenstock, NP  LORazepam (ATIVAN) 2 MG/ML injection Inject 0.25 mLs (0.5 mg  total) into the vein every 4 (four) hours as needed for anxiety. 05/10/18   Darel Hong D, NP  morphine 2 MG/ML injection Inject 0.5 mLs (1 mg total) into the vein every 2 (two) hours as needed (chest pain). 05/10/18   Bradly Bienenstock, NP  mouth rinse LIQD solution 15 mLs by Mouth Rinse route 2 times daily at 12 noon and 4 pm. 05/10/18   Bradly Bienenstock, NP  nitroGLYCERIN (NITROSTAT) 0.4 MG SL tablet Place 1 tablet (0.4 mg total) under the tongue every 5 (five) minutes as needed for chest pain. 05/10/18   Bradly Bienenstock, NP  ondansetron (ZOFRAN) 4 MG/2ML SOLN injection Inject 2 mLs (4 mg total) into the vein every 6 (six) hours as needed for nausea. 05/10/18   Bradly Bienenstock, NP  ondansetron [facility]) 4 MG/2ML SOLN injection Inject 2 mLs (4 mg total) into the vein every 6 (six) hours as needed for nausea, vomiting or refractory nausea / vomiting. 05/10/18   Bradly Bienenstock, NP  pantoprazole (PROTONIX) 40 MG injection Inject 40 mg into the vein every 12 (twelve) hours. 05/13/18   Darel Hong D, NP  pantoprazole 80 mg in sodium chloride 0.9 % 250 mL Inject 8 mg/hr into the vein continuous. 05/10/18   Darel Hong D, NP  phenylephrine 10 mg in sodium chloride 0.9 % 250 mL Inject 0-400 mcg/min into the vein continuous. 05/10/18   Bradly Bienenstock, NP  traMADol (ULTRAM) 50 MG tablet Take 1 tablet (50 mg total) by mouth every 6 (six) hours as needed for moderate pain or severe pain. 04/21/18   Nicholes Mango, MD  Vancomycin (VANCOCIN) 750-5 MG/150ML-% SOLN Inject 150 mLs (750 mg total) into the vein every Monday, Wednesday, and Friday with hemodialysis. 05/10/18   Bradly Bienenstock, NP  vitamin C (VITAMIN C) 500 MG tablet Take 1 tablet (500 mg total) by mouth 2 (two) times daily. 05/10/18   Bradly Bienenstock, NP       CBC Recent Labs  Lab 08/20/2018 0710 08/06/18 0045  WBC 7.3 5.9  HGB 10.1* 10.4*  HCT 31.6* 32.9*  PLT 150 114*  MCV 87.8 88.0  MCH 28.1 27.8  MCHC 32.0 31.6  RDW 19.1*  19.5*  LYMPHSABS 0.8  --   MONOABS 1.8*  --   EOSABS 0.1  --   BASOSABS 0.1  --    ------------------------------------------------------------------------------------------------------------------  Chemistries  Recent Labs  Lab 07/30/2018 0710 08/06/18 0045 08/06/18 0303  NA 141 135 136  K 4.4 4.8 5.0  CL 105 100 101  CO2 23 19* 19*  GLUCOSE 78 88 80  BUN 45* 55* 57*  CREATININE 5.27* 6.14* 6.31*  CALCIUM 8.3* 8.2* 8.4*  MG  --  1.7 1.7  AST 26  --   --   ALT 21  --   --   ALKPHOS 81  --   --   BILITOT 1.0  --   --    ------------------------------------------------------------------------------------------------------------------ estimated creatinine clearance is 11.4 mL/min (A) (by C-G formula based on SCr  of 6.31 mg/dL (H)). ------------------------------------------------------------------------------------------------------------------ Recent Labs    08/12/2018 1833  TSH 2.410     Coagulation profile No results for input(s): INR, PROTIME in the last 168 hours. ------------------------------------------------------------------------------------------------------------------- No results for input(s): DDIMER in the last 72 hours. -------------------------------------------------------------------------------------------------------------------  Cardiac Enzymes Recent Labs  Lab 08/15/2018 1833 08/06/18 0045 08/06/18 0303  TROPONINI 0.11* 0.18* 0.22*   ------------------------------------------------------------------------------------------------------------------ Invalid input(s): POCBNP  ---------------------------------------------------------------------------------------------------------------  Urinalysis    Component Value Date/Time   COLORURINE Straw 11/05/2012 2135   APPEARANCEUR Clear 11/05/2012 2135   LABSPEC 1.005 11/05/2012 2135   PHURINE 7.0 11/05/2012 2135   GLUCOSEU 50 mg/dL 11/05/2012 2135   HGBUR 1+ 11/05/2012 2135   BILIRUBINUR Negative  11/05/2012 2135   KETONESUR Negative 11/05/2012 2135   PROTEINUR 25 mg/dL 11/05/2012 2135   NITRITE Negative 11/05/2012 2135   LEUKOCYTESUR Negative 11/05/2012 2135     RADIOLOGY: Dg Chest 2 View  Result Date: 08/08/2018 CLINICAL DATA:  New cough and shortness of breath EXAM: CHEST - 2 VIEW COMPARISON:  May 10, 2018 FINDINGS: Vascular stent projects over the left subclavian region. Stable cardiomegaly. The hila, mediastinum, lungs, and pleura are normal. IMPRESSION: Persistent cardiomegaly.  No other abnormalities. Electronically Signed   By: Dorise Bullion III M.D   On: 07/26/2018 08:00   Ct Angio Chest Pe W And/or Wo Contrast  Result Date: 07/26/2018 CLINICAL DATA:  Chest pain. EXAM: CT ANGIOGRAPHY CHEST WITH CONTRAST TECHNIQUE: Multidetector CT imaging of the chest was performed using the standard protocol during bolus administration of intravenous contrast. Multiplanar CT image reconstructions and MIPs were obtained to evaluate the vascular anatomy. CONTRAST:  41mL OMNIPAQUE IOHEXOL 350 MG/ML SOLN COMPARISON:  Chest x-ray August 05, 2018.  Chest CT Jul 04, 2016. FINDINGS: Cardiovascular: Cardiomegaly is noted. A stent is seen in the left brachiocephalic vein. Patency cannot be evaluated due to lack of contrast in these vessels due to a right-sided injection. Atherosclerotic changes are seen in the aorta. The ascending thoracic aorta measures up to 4.4 cm, unchanged since May of 2018. No dissection identified. The main pulmonary artery measures 3.7 cm. No pulmonary emboli. Mediastinum/Nodes: The thyroid and esophagus are normal. No adenopathy. Tiny pleural effusions are noted. No pericardial effusion. Lungs/Pleura: Left retrocardiac atelectasis is noted. No other suspicious infiltrates. Central airways are normal. No pneumothorax. Upper Abdomen: No acute abnormality. Musculoskeletal: No chest wall abnormality. No acute or significant osseous findings. Review of the MIP images confirms the above  findings. IMPRESSION: 1. No pulmonary emboli identified. 2. Cardiomegaly.  Tiny pleural effusions. 3. There is a stent in the left brachiocephalic vein. Patency cannot be evaluated due to a right-sided injection. 4. Atherosclerotic changes in the aorta. 5. 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 Recommend annual imaging followup by CTA or MRA. This recommendation follows 2010 ACCF/AHA/AATS/ACR/ASA/SCA/SCAI/SIR/STS/SVM Guidelines for the Diagnosis and Management of Patients with Thoracic Aortic Disease. Circulation. 2010; 121: G665-L935. Aortic aneurysm NOS (ICD10-I71.9) 6. The main pulmonary artery measures 3.7 cm which raises the possibility of pulmonary arterial hypertension. 7. Mild left basilar atelectasis in the retrocardiac region. Aortic Atherosclerosis (ICD10-I70.0). Electronically Signed   By: Dorise Bullion III M.D   On: 08/08/2018 10:29   Ct Angio Chest/abd/pel For Dissection W And/or Wo Contrast  Result Date: 08/03/2018 CLINICAL DATA:  Chest pain for 4 days.  Cough. EXAM: CT ANGIOGRAPHY CHEST, ABDOMEN AND PELVIS TECHNIQUE: Multidetector CT imaging through the chest, abdomen and pelvis was performed using the standard protocol during bolus administration  of intravenous contrast. Multiplanar reconstructed images and MIPs were obtained and reviewed to evaluate the vascular anatomy. CONTRAST:  146mL ISOVUE-370 IOPAMIDOL (ISOVUE-370) INJECTION 76% COMPARISON:  CT of the chest August 05, 2018. CT of the abdomen and pelvis May 04, 2018 FINDINGS: CTA CHEST FINDINGS Cardiovascular: Cardiomegaly. Coronary artery calcifications identified. The pulmonary arteries were better assessed on the recent CT a of the chest for PE. No pulmonary emboli in the central pulmonary arteries. The patient had a normal PE study earlier today as well. Atherosclerotic changes are seen in the thoracic aorta without dissection. The ascending thoracic aorta measures 4.2 cm on today's  study, mildly aneurysmal. Mediastinum/Nodes: There is a stent in the left brachiocephalic vein which is well opacified on this study. The thyroid and esophagus are normal. A borderline node anterior to the trachea measuring 14 mm on axial image 40 is stable since 2018, likely reactive. No other evidence of adenopathy identified. Small pleural effusions. No pericardial effusion. Mild increased attenuation in the subcutaneous fat suggests volume overload. Lungs/Pleura: Tiny pleural effusions. Central airways are normal. Mild atelectasis in the posterior left upper lobe. Mild atelectasis in the retrocardiac region, stable. No pneumothorax. Central airways are unchanged no suspicious pulmonary nodules, masses, or focal infiltrates. Musculoskeletal: There is a healed anterior left rib fracture. No acute bony abnormalities. Review of the MIP images confirms the above findings. CTA ABDOMEN AND PELVIS FINDINGS VASCULAR Aorta: Atherosclerotic change is seen in the nonaneurysmal abdominal aorta, extending into the iliac and femoral vessels. No dissection. Celiac: Patent without evidence of aneurysm, dissection, vasculitis or significant stenosis. SMA: Atherosclerotic changes near the origin of the SMA without significant stenosis. No other abnormalities. Renals: Atherosclerosis at the origin of the bilateral renal arteries is identified. IMA: Patent without evidence of aneurysm, dissection, vasculitis or significant stenosis. Inflow: Patent without evidence of aneurysm, dissection, vasculitis or significant stenosis. Veins: No obvious venous abnormality within the limitations of this arterial phase study. Review of the MIP images confirms the above findings. NON-VASCULAR Hepatobiliary: No focal liver abnormality is seen. No gallstones, gallbladder wall thickening, or biliary dilatation. Pancreas: Unremarkable. No pancreatic ductal dilatation or surrounding inflammatory changes. Spleen: Normal in size without focal  abnormality. Adrenals/Urinary Tract: Adrenal glands are normal. The kidneys are atrophic consistent with renal disease. No hydronephrosis or acute perinephric stranding. The ureters are normal. The bladder is poorly distended but unremarkable. Stomach/Bowel: The stomach and small bowel are unremarkable. The colon is normal. The appendix is unremarkable. Lymphatic: No significant vascular findings are present. No enlarged abdominal or pelvic lymph nodes. Reproductive: Prostate is unremarkable. Other: There is increased attenuation in the subcutaneous fat and in the intra-abdominal fat consistent volume overload. There is a small amount of fluid in the right pericolic gutter. Some of this fluid is adjacent to the appendix but there is no convincing evidence of appendicitis. Musculoskeletal: No acute or significant osseous findings. Review of the MIP images confirms the above findings. IMPRESSION: 1. 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 This is stable since 2018. No dissection. Atherosclerotic changes noted. Recommend annual imaging followup by CTA or MRA. This recommendation follows 2010 ACCF/AHA/AATS/ACR/ASA/SCA/SCAI/SIR/STS/SVM Guidelines for the Diagnosis and Management of Patients with Thoracic Aortic Disease. Circulation. 2010; 121: D176-H607. Aortic aneurysm NOS (ICD10-I71.9) 2. Cardiomegaly.  Coronary artery calcifications. 3. Increased attenuation in the subcutaneous fat diffusely as well as in the intra-abdominal fat with a small amount of fluid in the right pericolic gutter. These findings are most consistent with  volume overload. 4. Tiny bilateral pleural effusions. Electronically Signed   By: Dorise Bullion III M.D   On: 08/06/2018 11:36    EKG: Orders placed or performed during the hospital encounter of 08/04/2018  . EKG 12-Lead  . EKG 12-Lead  . ED EKG  . ED EKG  . ED EKG 12-Lead  . ED EKG 12-Lead  . Repeat EKG  . Repeat EKG  . EKG 12-Lead   . EKG 12-Lead  . EKG 12-Lead  . EKG 12-Lead  . EKG 12-Lead  . EKG 12-Lead    ACUTE A FLUTTER WITH CHEST PAIN H/o COCAINE ABUSE, patient states he has been using COCAINE Follow CE, EKG Follow up Cardiology recs morphine as needed Digoxin per cardiology HD as needed  Transfer to gen med floor    Dametra Whetsel Patricia Pesa, M.D.  [facility]  Medical Director [place] Director [facility] Cardio-Pulmonary Department

## 2018-08-06 NOTE — Consult Note (Signed)
Pharmacy Antibiotic Note  [name] is a 63 y.o. male admitted on 08/08/2018 with sepsis.  Pharmacy has been consulted for cefepime and vancomycin dosing. Pt has ESRD and is on HD MWF. Per Nephro holding further HD for now.   Plan: Will order cefepime 2 g on HD days.   Pt received vancomycin 1000 mg x 1 in the ED. Will give an additional 750 mg x 1 for a total loading dose of 1750 mg. Maintenance dose of 750 mg on HD days will be ordered. Level will be drawn on the day 4-5 on abx.   If patient does not get HD on Monday - will need to reevaluate dosing.    Height: 5\' 8"  (172.7 cm) Weight: 165 lb 12.6 oz (75.2 kg) IBW/kg (Calculated) : 68.4  Temp (24hrs), Avg:98.4 F (36.9 C), Min:98 F (36.7 C), Max:98.6 F (37 C)  Recent Labs  Lab 08/16/2018 0710 08/12/2018 0746 08/06/18 0045 08/06/18 0303  WBC 7.3  --  5.9  --   CREATININE 5.27*  --  6.14* 6.31*  LATICACIDVEN  --  0.9  --   --     Estimated Creatinine Clearance: 11.4 mL/min (A) (by C-G formula based on SCr of 6.31 mg/dL (H)).    Allergies  Allergen Reactions  . Sulfa Antibiotics Shortness Of Breath, Itching and Rash    Antimicrobials this admission: 6/13 cefepime >>  6/13 vancomycin >>   Dose adjustments this admission: None  Microbiology results: 6/13 BCx: pending  Thank you for allowing pharmacy to be a part of this patient's care.  Oswald Hillock, PharmD, BCPS 08/06/2018 10:44 AM

## 2018-08-06 NOTE — Progress Notes (Signed)
Progress Note  Patient Name: [name] Date of Encounter: 08/06/2018  Primary Cardiologist: Nelva Bush, MD   Subjective   Somewhat better today, still with cough, chest congestion Occasional chest tightness worse with coughing Telemetry reviewed variable rate in flutter 6070 at the low end but predominantly 100 bpm  Inpatient Medications    Scheduled Meds:  aspirin EC  81 mg Oral Daily   atorvastatin  40 mg Oral Daily   benzonatate  100 mg Oral TID   heparin  5,000 Units Subcutaneous Q8H   mouth rinse  15 mL Mouth Rinse BID   midodrine  10 mg Oral TID WC   sodium chloride flush  3 mL Intravenous Q12H   Continuous Infusions:  sodium chloride     [START ON 08/07/2018] ceFEPime (MAXIPIME) IV     [START ON 08/07/2018] vancomycin     PRN Meds: sodium chloride, acetaminophen **OR** acetaminophen, diphenhydrAMINE, guaiFENesin, ipratropium-albuterol, morphine injection, nitroGLYCERIN, ondansetron **OR** ondansetron (ZOFRAN) IV, sodium chloride flush   Vital Signs    Vitals:   08/06/18 0800 08/06/18 0900 08/06/18 0925 08/06/18 1000  BP: 106/88  (!) 69/34 (!) 75/59  Pulse: (!) 102 (!) 102 64 74  Resp: (!) 23 20 17  (!) 8  Temp: 98.6 F (37 C)     TempSrc: Axillary     SpO2: 95% 91% 97% 98%  Weight:      Height:        Intake/Output Summary (Last 24 hours) at 08/06/2018 1154 Last data filed at 08/06/2018 1007 Gross per 24 hour  Intake 1881.5 ml  Output --  Net 1881.5 ml   Last 3 Weights 07/26/2018 07/26/2018 07/25/2018  Weight (lbs) 165 lb 12.6 oz 171 lb 170 lb  Weight (kg) 75.2 kg 77.565 kg 77.111 kg      Telemetry    Atrial flutter with variable rate at the low range 60-70 but predominantly rate 100 bpm personally Reviewed  ECG    - Personally Reviewed  Physical Exam  GEN: No acute distress.  Coughing Neck: No JVD Cardiac: RRR, no murmurs, rubs, or gallops.  Respiratory:  Scattered Rales GI: Soft, nontender, non-distended  MS: No edema; No  deformity. Neuro:  Nonfocal  Psych: Normal affect   Labs    Chemistry Recent Labs  Lab 07/25/2018 0710 08/06/18 0045 08/06/18 0303  NA 141 135 136  K 4.4 4.8 5.0  CL 105 100 101  CO2 23 19* 19*  GLUCOSE 78 88 80  BUN 45* 55* 57*  CREATININE 5.27* 6.14* 6.31*  CALCIUM 8.3* 8.2* 8.4*  PROT 7.4  --   --   ALBUMIN 3.4*  --   --   AST 26  --   --   ALT 21  --   --   ALKPHOS 81  --   --   BILITOT 1.0  --   --   GFRNONAA 11* 9* 9*  GFRAA 12* 10* 10*  ANIONGAP 13 16* 16*     Hematology Recent Labs  Lab 08/16/2018 0710 08/06/18 0045  WBC 7.3 5.9  RBC 3.60* 3.74*  HGB 10.1* 10.4*  HCT 31.6* 32.9*  MCV 87.8 88.0  MCH 28.1 27.8  MCHC 32.0 31.6  RDW 19.1* 19.5*  PLT 150 114*    Cardiac Enzymes Recent Labs  Lab 07/25/2018 1418 08/18/2018 1833 08/06/18 0045 08/06/18 0303  TROPONINI 0.11* 0.11* 0.18* 0.22*   No results for input(s): TROPIPOC in the last 168 hours.   BNP Recent Labs  Lab  08/20/2018 0710  BNP 2,373.0*     DDimer No results for input(s): DDIMER in the last 168 hours.   Radiology    Dg Chest 2 View  Result Date: 08/14/2018 CLINICAL DATA:  New cough and shortness of breath EXAM: CHEST - 2 VIEW COMPARISON:  May 10, 2018 FINDINGS: Vascular stent projects over the left subclavian region. Stable cardiomegaly. The hila, mediastinum, lungs, and pleura are normal. IMPRESSION: Persistent cardiomegaly.  No other abnormalities. Electronically Signed   By: Dorise Bullion III M.D   On: 08/17/2018 08:00   Ct Angio Chest Pe W And/or Wo Contrast  Result Date: 08/22/2018 CLINICAL DATA:  Chest pain. EXAM: CT ANGIOGRAPHY CHEST WITH CONTRAST TECHNIQUE: Multidetector CT imaging of the chest was performed using the standard protocol during bolus administration of intravenous contrast. Multiplanar CT image reconstructions and MIPs were obtained to evaluate the vascular anatomy. CONTRAST:  109mL OMNIPAQUE IOHEXOL 350 MG/ML SOLN COMPARISON:  Chest x-ray August 05, 2018.  Chest CT  Jul 04, 2016. FINDINGS: Cardiovascular: Cardiomegaly is noted. A stent is seen in the left brachiocephalic vein. Patency cannot be evaluated due to lack of contrast in these vessels due to a right-sided injection. Atherosclerotic changes are seen in the aorta. The ascending thoracic aorta measures up to 4.4 cm, unchanged since May of 2018. No dissection identified. The main pulmonary artery measures 3.7 cm. No pulmonary emboli. Mediastinum/Nodes: The thyroid and esophagus are normal. No adenopathy. Tiny pleural effusions are noted. No pericardial effusion. Lungs/Pleura: Left retrocardiac atelectasis is noted. No other suspicious infiltrates. Central airways are normal. No pneumothorax. Upper Abdomen: No acute abnormality. Musculoskeletal: No chest wall abnormality. No acute or significant osseous findings. Review of the MIP images confirms the above findings. IMPRESSION: 1. No pulmonary emboli identified. 2. Cardiomegaly.  Tiny pleural effusions. 3. There is a stent in the left brachiocephalic vein. Patency cannot be evaluated due to a right-sided injection. 4. Atherosclerotic changes in the aorta. 5. 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 Recommend annual imaging followup by CTA or MRA. This recommendation follows 2010 ACCF/AHA/AATS/ACR/ASA/SCA/SCAI/SIR/STS/SVM Guidelines for the Diagnosis and Management of Patients with Thoracic Aortic Disease. Circulation. 2010; 121: X528-U132. Aortic aneurysm NOS (ICD10-I71.9) 6. The main pulmonary artery measures 3.7 cm which raises the possibility of pulmonary arterial hypertension. 7. Mild left basilar atelectasis in the retrocardiac region. Aortic Atherosclerosis (ICD10-I70.0). Electronically Signed   By: Dorise Bullion III M.D   On: 08/07/2018 10:29   Ct Angio Chest/abd/pel For Dissection W And/or Wo Contrast  Result Date: 08/11/2018 CLINICAL DATA:  Chest pain for 4 days.  Cough. EXAM: CT ANGIOGRAPHY CHEST, ABDOMEN AND  PELVIS TECHNIQUE: Multidetector CT imaging through the chest, abdomen and pelvis was performed using the standard protocol during bolus administration of intravenous contrast. Multiplanar reconstructed images and MIPs were obtained and reviewed to evaluate the vascular anatomy. CONTRAST:  147mL ISOVUE-370 IOPAMIDOL (ISOVUE-370) INJECTION 76% COMPARISON:  CT of the chest August 05, 2018. CT of the abdomen and pelvis May 04, 2018 FINDINGS: CTA CHEST FINDINGS Cardiovascular: Cardiomegaly. Coronary artery calcifications identified. The pulmonary arteries were better assessed on the recent CT a of the chest for PE. No pulmonary emboli in the central pulmonary arteries. The patient had a normal PE study earlier today as well. Atherosclerotic changes are seen in the thoracic aorta without dissection. The ascending thoracic aorta measures 4.2 cm on today's study, mildly aneurysmal. Mediastinum/Nodes: There is a stent in the left brachiocephalic vein which is well opacified on  this study. The thyroid and esophagus are normal. A borderline node anterior to the trachea measuring 14 mm on axial image 40 is stable since 2018, likely reactive. No other evidence of adenopathy identified. Small pleural effusions. No pericardial effusion. Mild increased attenuation in the subcutaneous fat suggests volume overload. Lungs/Pleura: Tiny pleural effusions. Central airways are normal. Mild atelectasis in the posterior left upper lobe. Mild atelectasis in the retrocardiac region, stable. No pneumothorax. Central airways are unchanged no suspicious pulmonary nodules, masses, or focal infiltrates. Musculoskeletal: There is a healed anterior left rib fracture. No acute bony abnormalities. Review of the MIP images confirms the above findings. CTA ABDOMEN AND PELVIS FINDINGS VASCULAR Aorta: Atherosclerotic change is seen in the nonaneurysmal abdominal aorta, extending into the iliac and femoral vessels. No dissection. Celiac: Patent without  evidence of aneurysm, dissection, vasculitis or significant stenosis. SMA: Atherosclerotic changes near the origin of the SMA without significant stenosis. No other abnormalities. Renals: Atherosclerosis at the origin of the bilateral renal arteries is identified. IMA: Patent without evidence of aneurysm, dissection, vasculitis or significant stenosis. Inflow: Patent without evidence of aneurysm, dissection, vasculitis or significant stenosis. Veins: No obvious venous abnormality within the limitations of this arterial phase study. Review of the MIP images confirms the above findings. NON-VASCULAR Hepatobiliary: No focal liver abnormality is seen. No gallstones, gallbladder wall thickening, or biliary dilatation. Pancreas: Unremarkable. No pancreatic ductal dilatation or surrounding inflammatory changes. Spleen: Normal in size without focal abnormality. Adrenals/Urinary Tract: Adrenal glands are normal. The kidneys are atrophic consistent with renal disease. No hydronephrosis or acute perinephric stranding. The ureters are normal. The bladder is poorly distended but unremarkable. Stomach/Bowel: The stomach and small bowel are unremarkable. The colon is normal. The appendix is unremarkable. Lymphatic: No significant vascular findings are present. No enlarged abdominal or pelvic lymph nodes. Reproductive: Prostate is unremarkable. Other: There is increased attenuation in the subcutaneous fat and in the intra-abdominal fat consistent volume overload. There is a small amount of fluid in the right pericolic gutter. Some of this fluid is adjacent to the appendix but there is no convincing evidence of appendicitis. Musculoskeletal: No acute or significant osseous findings. Review of the MIP images confirms the above findings. IMPRESSION: 1. 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 This is stable since 2018. No dissection. Atherosclerotic changes noted. Recommend annual  imaging followup by CTA or MRA. This recommendation follows 2010 ACCF/AHA/AATS/ACR/ASA/SCA/SCAI/SIR/STS/SVM Guidelines for the Diagnosis and Management of Patients with Thoracic Aortic Disease. Circulation. 2010; 121: I433-I951. Aortic aneurysm NOS (ICD10-I71.9) 2. Cardiomegaly.  Coronary artery calcifications. 3. Increased attenuation in the subcutaneous fat diffusely as well as in the intra-abdominal fat with a small amount of fluid in the right pericolic gutter. These findings are most consistent with volume overload. 4. Tiny bilateral pleural effusions. Electronically Signed   By: Dorise Bullion III M.D   On: 08/11/2018 11:36    Cardiac Studies   Echo pending  Patient Profile     Jasani Dolney is a 63 y.o. male with a hx of ischemic and nonischemic cardiomyopathy, coronary artery disease, chronic combined CHF, end-stage renal disease on hemodialysis, frequent PVCs/bigeminy,Hepatitis C, GI bleed anemia of chronic disease, pulmonary hypertension, hypertensive heart disease, PAD presenting with chest pain, cough, hypertension  Assessment & Plan    1) atrial flutter With variable rate from the 60s up to 130s, predominantly at 100 Telemetry reviewed, overnight periodically down into the 60s asymptomatic This morning down into the 60s with associated hypotension,  etiology unclear -Echocardiogram pending to evaluate ejection fraction, rule out pericardial effusion -Not a good candidate for rate or rhythm control given profound GI bleed history 3 months ago at [facility], also with variable rate and hypotension when running in the 60s would not add additional agents.  No room on his blood pressure for beta-blockers, calcium channel blockers.  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  May do better with rate 100 or higher Echo pending  2) cocaine  abuse He admits to using cocaine recently, likely contributing to coronary spasm Cessation recommended Minimally elevated troponin  3) cough, hypotension, sepsis Covered by broad-spectrum antibiotics Has significant cough and congestion concerning for bronchitis or early pneumonia  4)End-stage renal disease Had dialysis yesterday, plan for dialysis tomorrow per nephrology Case discussed in detail  5)CAD with stable angina Symptoms are exacerbated by tachycardia and cocaine use Unfortunate not a good candidate for heparin or anticoagulation given profound GI bleeding history February and March of this year Echocardiogram pending  6) GI bleed History as detailed below GI bleed February 2020 Duodenal ulcer on EGD, Return to hospital March 2020 with bleeding same duodenal ulcer visualized with clips and cauterization performed Decompensated May 09, 2018 empiric embolization Hospitalized again May 11, 2018 left AMA return to May 15, 2018 rectal bleeding Hypotension and drop in hemoglobin leading to PEA arrest return of spontaneous circulation Work-up hemorrhage in the stomach, small bowel, colon Treated with PPI infusion Had repeat EGD duodenal ulcer again visualized Required pressors, hemoglobin down to 6.9 Transitioned to oral PPI twice daily --- In light of the above would not start heparin for atrial flutter   Total encounter time more than 35 minutes  Greater than 50% was spent in counseling and coordination of care with the patient  For questions or updates, please contact [facility] Please consult [email or web address] for contact info under        Signed, Ida Rogue, MD  08/06/2018, 11:54 AM

## 2018-08-07 ENCOUNTER — Inpatient Hospital Stay: Payer: Medicare Other

## 2018-08-07 DIAGNOSIS — I484 Atypical atrial flutter: Secondary | ICD-10-CM

## 2018-08-07 LAB — TROPONIN I: Troponin I: 2.05 ng/mL (ref <0.03)

## 2018-08-07 LAB — GLUCOSE, CAPILLARY
Glucose-Capillary: 72 mg/dL (ref 70–99)
Glucose-Capillary: 96 mg/dL (ref 70–99)
Glucose-Capillary: 106 mg/dL — ABNORMAL HIGH (ref 70–99)
Glucose-Capillary: 82 mg/dL (ref 70–99)
Glucose-Capillary: 81 mg/dL (ref 70–99)
Glucose-Capillary: 59 mg/dL — ABNORMAL LOW (ref 70–99)
Glucose-Capillary: 36 mg/dL — CL (ref 70–99)
Glucose-Capillary: 99 mg/dL (ref 70–99)

## 2018-08-07 LAB — BLOOD GAS, ARTERIAL
Acid-base deficit: 17.3 mmol/L — ABNORMAL HIGH (ref 0.0–2.0)
Bicarbonate: 10 mmol/L — ABNORMAL LOW (ref 20.0–28.0)
FIO2: 0.28
O2 Saturation: 92.6 %
Patient temperature: 37
pCO2 arterial: 28 mmHg — ABNORMAL LOW (ref 32.0–48.0)
pH, Arterial: 7.16 — CL (ref 7.350–7.450)
pO2, Arterial: 84 mmHg (ref 83.0–108.0)

## 2018-08-07 LAB — HEPATIC FUNCTION PANEL
ALT: 27 U/L (ref 0–44)
AST: 51 U/L — ABNORMAL HIGH (ref 15–41)
Albumin: 3.4 g/dL — ABNORMAL LOW (ref 3.5–5.0)
Alkaline Phosphatase: 87 U/L (ref 38–126)
Bilirubin, Direct: 0.6 mg/dL — ABNORMAL HIGH (ref 0.0–0.2)
Indirect Bilirubin: 0.4 mg/dL (ref 0.3–0.9)
Total Bilirubin: 1 mg/dL (ref 0.3–1.2)
Total Protein: 7.9 g/dL (ref 6.5–8.1)

## 2018-08-07 LAB — HEMOGLOBIN A1C
Hgb A1c MFr Bld: 5.3 % (ref 4.8–5.6)
Mean Plasma Glucose: 105.41 mg/dL

## 2018-08-07 LAB — HEMOGLOBIN: Hemoglobin: 11.7 g/dL — ABNORMAL LOW (ref 13.0–17.0)

## 2018-08-07 LAB — CORTISOL: Cortisol, Plasma: 27.6 ug/dL

## 2018-08-07 LAB — LACTIC ACID, PLASMA: Lactic Acid, Venous: 6.3 mmol/L (ref 0.5–1.9)

## 2018-08-07 LAB — LIPASE, BLOOD: Lipase: 45 U/L (ref 11–51)

## 2018-08-07 MED ORDER — KETOROLAC TROMETHAMINE 15 MG/ML IJ SOLN
15.0000 mg | Freq: Three times a day (TID) | INTRAMUSCULAR | Status: DC
  Administered 2018-08-07 – 2018-08-08 (×2): 15 mg via INTRAVENOUS
  Filled 2018-08-07 (×3): qty 1

## 2018-08-07 MED ORDER — MORPHINE SULFATE (PF) 2 MG/ML IV SOLN
1.0000 mg | INTRAVENOUS | Status: DC | PRN
  Administered 2018-08-07: 2 mg via INTRAVENOUS
  Administered 2018-08-08 (×3): 1 mg via INTRAVENOUS
  Administered 2018-08-09 – 2018-08-12 (×9): 2 mg via INTRAVENOUS
  Filled 2018-08-07 (×14): qty 1

## 2018-08-07 MED ORDER — ACETAMINOPHEN 325 MG PO TABS: 650.0000 mg | ORAL_TABLET | Freq: Four times a day (QID) | ORAL | Status: DC | PRN

## 2018-08-07 MED ORDER — COLCHICINE 0.6 MG PO TABS
0.6000 mg | ORAL_TABLET | Freq: Every day | ORAL | Status: DC
  Administered 2018-08-08: 0.6 mg via ORAL
  Filled 2018-08-07 (×2): qty 1

## 2018-08-07 MED ORDER — OXYCODONE-ACETAMINOPHEN 5-325 MG PO TABS
1.0000 | ORAL_TABLET | Freq: Four times a day (QID) | ORAL | Status: DC | PRN
  Administered 2018-08-08 – 2018-08-09 (×3): 1 via ORAL
  Filled 2018-08-07 (×4): qty 1

## 2018-08-07 MED ORDER — DEXTROSE 10 % IV SOLN
INTRAVENOUS | Status: DC
  Administered 2018-08-07 – 2018-08-10 (×4): via INTRAVENOUS

## 2018-08-07 MED ORDER — ACETAMINOPHEN 650 MG RE SUPP: 650.0000 mg | Freq: Four times a day (QID) | RECTAL | Status: DC | PRN

## 2018-08-07 MED ORDER — OXYCODONE-ACETAMINOPHEN 5-325 MG PO TABS
1.0000 | ORAL_TABLET | Freq: Four times a day (QID) | ORAL | Status: DC | PRN
  Administered 2018-08-07: 1 via ORAL
  Filled 2018-08-07: qty 1

## 2018-08-07 MED ORDER — NOREPINEPHRINE 4 MG/250ML-% IV SOLN
0.0000 ug/min | INTRAVENOUS | Status: DC
  Administered 2018-08-08: 4 ug/min via INTRAVENOUS
  Administered 2018-08-08: 40 ug/min via INTRAVENOUS
  Administered 2018-08-08: 20 ug/min via INTRAVENOUS
  Filled 2018-08-07 (×5): qty 250

## 2018-08-07 MED ORDER — DEXTROSE 50 % IV SOLN
1.0000 | Freq: Once | INTRAVENOUS | Status: AC
  Administered 2018-08-07: 50 mL via INTRAVENOUS

## 2018-08-07 MED ORDER — CINACALCET HCL 30 MG PO TABS
30.0000 mg | ORAL_TABLET | ORAL | Status: DC
  Administered 2018-08-09: 30 mg via ORAL
  Filled 2018-08-07 (×3): qty 1

## 2018-08-07 MED ORDER — DEXTROSE 50 % IV SOLN
INTRAVENOUS | Status: AC
  Filled 2018-08-07: qty 50

## 2018-08-07 MED ORDER — MORPHINE SULFATE (PF) 2 MG/ML IV SOLN
INTRAVENOUS | Status: AC
  Administered 2018-08-07: 2 mg via INTRAVENOUS
  Filled 2018-08-07: qty 1

## 2018-08-07 MED ORDER — IOHEXOL 300 MG/ML  SOLN
100.0000 mL | Freq: Once | INTRAMUSCULAR | Status: AC | PRN
  Administered 2018-08-07: 100 mL via INTRAVENOUS

## 2018-08-07 MED ORDER — SODIUM CHLORIDE 0.9 % IV BOLUS
500.0000 mL | Freq: Once | INTRAVENOUS | Status: AC
  Administered 2018-08-07: 500 mL via INTRAVENOUS

## 2018-08-07 MED ORDER — DEXTROSE 50 % IV SOLN
INTRAVENOUS | Status: AC
  Administered 2018-08-07: 50 mL via INTRAVENOUS
  Filled 2018-08-07: qty 50

## 2018-08-07 MED ORDER — CALCIUM ACETATE (PHOS BINDER) 667 MG PO CAPS
2001.0000 mg | ORAL_CAPSULE | Freq: Three times a day (TID) | ORAL | Status: DC
  Administered 2018-08-08 – 2018-08-09 (×3): 2001 mg via ORAL
  Filled 2018-08-07 (×12): qty 3

## 2018-08-07 NOTE — Progress Notes (Signed)
   08/07/18 1400  Clinical Encounter Type  Visited With Patient not available;Health care provider  Visit Type Initial  Referral From Nurse  Consult/Referral To Chaplain  Chaplain arrived for page and made pastoral presence known and nurse inform everything was ok.

## 2018-08-07 NOTE — Progress Notes (Signed)
Rapid response called due to patient being unresponsive.  Discussed with Dr. Posey Pronto.  Patient admitted for chest pain, new onset atrial flutter converting to normal sinus rhythm with bradycardia.  Due for hemodialysis later today.  Dr. Juleen China arrived in the room while patient was being evaluated.  Patient's saturations stable on 2 L oxygen.  Blood glucose was checked and fingerstick showing glucose less than 10.  Patient given stat dose of D50 and awake and responding to questions.  Denies any chest pain at this point.  Still mildly confused.  *Acute metabolic encephalopathy due to severe hypoglycemia.  Patient has  Diabetes mentioned in his history but not on any medications at home.  Will check hemoglobin A1c along with insulin and C-peptide levels.  Start D10 at 50 mL/h.  Repeat Accu-Cheks 30 minutes and 1 hour.  Discussed with his nurse Tammy.  Ordered Accu-Cheks.  Once blood sugar stabilized can change to every 4 hours. CT scan of the head has been ordered and pending  *Sinus bradycardia after conversion from atrial flutter.  Cardiology following.  Presently heart rate in the high 40s.  Blood pressure is stable.  Plan is to monitor.  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  *End-stage renal disease.  Discussed Dr. Juleen China.  Patient not stable enough to go down for hemodialysis at this time.  He will reassess later today for hemodialysis in the evening or tomorrow morning.  Patient does not seem to be fluid overloaded at this time.  Total critical care time spent 35 minutes.

## 2018-08-07 NOTE — Progress Notes (Signed)
Patient still drowsy.  Continues to be bradycardic in 30s to 40s.  Troponin trending up.  ABG with metabolic acidosis.  I have ordered a lactic acid and stat hemoglobin  If hemoglobin is stable will need to start him on a heparin drip.  Discussed with [facility] ICU attending and also ICU nurse practitioner Barth Kirks covering here.  Continues to be on D10 for hypoglycemia.  Will discuss with Dr. Juleen China.

## 2018-08-07 NOTE — Progress Notes (Signed)
[facility]  ROUNDING NOTE   Subjective:   Rapid response this afternoon due to hypoglycemia. Started on IV Dextrose.   Hemodialysis for later today.   Converted to sinus bradycardia  Objective:  Vital signs in last 24 hours:  Temp:  [97.4 F (36.3 C)-98.3 F (36.8 C)] 98.3 F (36.8 C) (06/15 0740) Pulse Rate:  [40-99] 40 (06/15 1018) Resp:  [19-20] 19 (06/15 0740) BP: (96-112)/(30-70) 96/30 (06/15 1018) SpO2:  [97 %-100 %] 100 % (06/15 1018) Weight:  [76.7 kg] 76.7 kg (06/15 0334)  Weight change: -0.408 kg Filed Weights   08/12/2018 0804 08/12/2018 1346 08/07/18 0334  Weight: 77.6 kg 75.2 kg 76.7 kg    Intake/Output: I/O last 3 completed shifts: In: 8127 [P.O.:600; I.V.:20; IV Piggyback:999] Out: 0    Intake/Output this shift:  Total I/O In: 120 [P.O.:120] Out: -   Physical Exam: General: NAD  Head: Normocephalic, atraumatic. Moist oral mucosal membranes  Eyes: Anicteric  Neck: Supple, trachea midline  Lungs:  Clear to auscultation, normal effort  Heart: regular  Abdomen:  Soft, nontender, bowel sounds present  Extremities: Trace peripheral edema.  Neurologic: Awake, alert, following commands  Skin: No lesions  Access: LUE AVF    Basic Metabolic Panel: Recent Labs  Lab 07/30/2018 0710 08/06/18 0045 08/06/18 0303  NA 141 135 136  K 4.4 4.8 5.0  CL 105 100 101  CO2 23 19* 19*  GLUCOSE 78 88 80  BUN 45* 55* 57*  CREATININE 5.27* 6.14* 6.31*  CALCIUM 8.3* 8.2* 8.4*  MG  --  1.7 1.7  PHOS  --  9.9* 10.1*    Liver Function Tests: Recent Labs  Lab 08/22/2018 0710  AST 26  ALT 21  ALKPHOS 81  BILITOT 1.0  PROT 7.4  ALBUMIN 3.4*   No results for input(s): LIPASE, AMYLASE in the last 168 hours. No results for input(s): AMMONIA in the last 168 hours.  CBC: Recent Labs  Lab 08/15/2018 0710 08/06/18 0045  WBC 7.3 5.9  NEUTROABS 4.5  --   HGB 10.1* 10.4*  HCT 31.6* 32.9*  MCV 87.8 88.0  PLT 150 114*    Cardiac Enzymes: Recent  Labs  Lab 08/04/2018 1157 08/10/2018 1418 08/19/2018 1833 08/06/18 0045 08/06/18 0303  TROPONINI 0.14* 0.11* 0.11* 0.18* 0.22*    BNP: Invalid input(s): POCBNP  CBG: Recent Labs  Lab 08/04/2018 1346 08/07/18 1423 08/07/18 1428 08/07/18 1506  GLUCAP 73 <10* 72 96    Microbiology: Results for orders placed or performed during the hospital encounter of 07/26/2018  Blood culture (routine x 2)     Status: None (Preliminary result)   Collection Time: 08/07/2018  7:46 AM   Specimen: BLOOD  Result Value Ref Range Status   Specimen Description BLOOD RFA  Final   Special Requests   Final    BOTTLES DRAWN AEROBIC AND ANAEROBIC Blood Culture results may not be optimal due to an excessive volume of blood received in culture bottles   Culture   Final    NO GROWTH < 24 HOURS Performed at [facility], [address], [place], [place] [address]    Report Status PENDING  Incomplete  Blood culture (routine x 2)     Status: None (Preliminary result)   Collection Time: 08/16/2018  7:47 AM   Specimen: BLOOD  Result Value Ref Range Status   Specimen Description BLOOD R AC  Final   Special Requests   Final    BOTTLES DRAWN AEROBIC AND ANAEROBIC  Blood Culture results may not be optimal due to an excessive volume of blood received in culture bottles   Culture   Final    NO GROWTH < 24 HOURS Performed at [facility], [address] [place], [place] [address]    Report Status PENDING  Incomplete  SARS Coronavirus 2 (CEPHEID- Performed in [place] hospital lab), Hosp Order     Status: None   Collection Time: 08/02/2018  8:34 AM   Specimen: Nasopharyngeal Swab  Result Value Ref Range Status   SARS Coronavirus 2 NEGATIVE NEGATIVE Final    Comment: (NOTE) If result is NEGATIVE SARS-CoV-2 target nucleic acids are NOT DETECTED. The SARS-CoV-2 RNA is generally detectable in upper and lower  respiratory specimens during the acute phase of infection. The lowest  concentration of  SARS-CoV-2 viral copies this assay can detect is 250  copies / mL. A negative result does not preclude SARS-CoV-2 infection  and should not be used as the sole basis for treatment or other  patient management decisions.  A negative result may occur with  improper specimen collection / handling, submission of specimen other  than nasopharyngeal swab, presence of viral mutation(s) within the  areas targeted by this assay, and inadequate number of viral copies  (<250 copies / mL). A negative result must be combined with clinical  observations, patient history, and epidemiological information. If result is POSITIVE SARS-CoV-2 target nucleic acids are DETECTED. The SARS-CoV-2 RNA is generally detectable in upper and lower  respiratory specimens dur ing the acute phase of infection.  Positive  results are indicative of active infection with SARS-CoV-2.  Clinical  correlation with patient history and other diagnostic information is  necessary to determine patient infection status.  Positive results do  not rule out bacterial infection or co-infection with other viruses. If result is PRESUMPTIVE POSTIVE SARS-CoV-2 nucleic acids MAY BE PRESENT.   A presumptive positive result was obtained on the submitted specimen  and confirmed on repeat testing.  While 2019 novel coronavirus  (SARS-CoV-2) nucleic acids may be present in the submitted sample  additional confirmatory testing may be necessary for epidemiological  and / or clinical management purposes  to differentiate between  SARS-CoV-2 and other Sarbecovirus currently known to infect humans.  If clinically indicated additional testing with an alternate test  methodology [phone]) is advised. The SARS-CoV-2 RNA is generally  detectable in upper and lower respiratory sp ecimens during the acute  phase of infection. The expected result is Negative. Fact Sheet for Patients:  StrictlyIdeas.no Fact Sheet for Healthcare  Providers: BankingDealers.co.za This test is not yet approved or cleared by the [place] FDA and has been authorized for detection and/or diagnosis of SARS-CoV-2 by FDA under an Emergency Use Authorization (EUA).  This EUA will remain in effect (meaning this test can be used) for the duration of the COVID-19 declaration under Section 564(b)(1) of the Act, 21 U.S.C. section 360bbb-3(b)(1), unless the authorization is terminated or revoked sooner. Performed at [facility], [address] [place],  [address]   MRSA PCR Screening     Status: None   Collection Time: 07/31/2018  1:50 PM   Specimen: Nasal Mucosa; Nasopharyngeal  Result Value Ref Range Status   MRSA by PCR NEGATIVE NEGATIVE Final    Comment:        The GeneXpert MRSA Assay (FDA approved for NASAL specimens only), is one component of a comprehensive MRSA colonization surveillance program. It is not intended to diagnose MRSA infection  nor to guide or monitor treatment for MRSA infections. Performed at [facility], [address] [place], [place] [address]     Coagulation Studies: No results for input(s): LABPROT, INR in the last 72 hours.  Urinalysis: No results for input(s): COLORURINE, LABSPEC, PHURINE, GLUCOSEU, HGBUR, BILIRUBINUR, KETONESUR, PROTEINUR, UROBILINOGEN, NITRITE, LEUKOCYTESUR in the last 72 hours.  Invalid input(s): APPERANCEUR    Imaging: No results found.   Medications:   . sodium chloride    . ceFEPime (MAXIPIME) IV    . dextrose 50 mL/hr at 08/07/18 1449  . vancomycin     . aspirin EC  81 mg Oral Daily  . atorvastatin  40 mg Oral Daily  . benzonatate  100 mg Oral TID  . colchicine  0.6 mg Oral Daily  . dextrose      . docusate sodium  100 mg Oral Daily  . heparin  5,000 Units Subcutaneous Q8H  . ketorolac  15 mg Intravenous Q8H  . mouth rinse  15 mL Mouth Rinse BID  . midodrine  10 mg Oral TID WC  . sodium chloride flush  3  mL Intravenous Q12H   sodium chloride, acetaminophen **OR** acetaminophen, diphenhydrAMINE, guaiFENesin, ipratropium-albuterol, nitroGLYCERIN, ondansetron **OR** ondansetron (ZOFRAN) IV, oxyCODONE-acetaminophen, senna-docusate, sodium chloride flush  Assessment/ Plan:  63 y.o. male end stage renal disease on hemodialysis, hypertension, hepatitis C, anemia, seizure disorder, COPD/tobacco abuse,substance abuse and alcohol abuse,secondary hyperparathyroidism, chronic systolic CHF admitted with chest pain and atrial flutter.  [facility] MWF [address].Left arm AVG. 240 minutes/72kg  1.  ESRD on HD MWF.   Hemodialysis for later today. Orders prepared.   2.  Anemia of chronic kidney disease.  Hemoglobin 10.4.  Holding EPO due to concerns of dissection.   3.  Secondary hyperparathyroidism with hyperphophatemia. PTH elevated at 1178 on 07/31/2018 - home regimen of calcium acetate. Will restart.  - restart cinacalcet three times a day.   4.  Chest pain/atrial flutter.  Converted to sinus bradycardia.  Appreciate cardiology input.    LOS: 2 [name] 6/15/20203:32 PM

## 2018-08-07 NOTE — Progress Notes (Signed)
[place] at [facility]                                                                                                                                                                                  Patient Demographics   [name], is a 63 y.o. male, DOB - May 03, 1955, SAY:[id number]  Admit date - 08/04/2018   Admitting Physician Dustin Flock, MD  Outpatient Primary MD for the patient is Donnie Coffin, MD   LOS - 2  Subjective: Patient continues to complain of chest pain.  Patient keeps asking for morphine.  Review of Systems:   CONSTITUTIONAL: Patient drowsy  Vitals:   Vitals:   08/06/18 2325 08/07/18 0334 08/07/18 0740 08/07/18 1018  BP: 100/70 108/68 101/70 (!) 96/30  Pulse: 95 86 95 (!) 40  Resp:  20 19   Temp:  98.3 F (36.8 C) 98.3 F (36.8 C)   TempSrc:  Oral    SpO2: 100% 98% 97% 100%  Weight:  76.7 kg    Height:        Wt Readings from Last 3 Encounters:  08/07/18 76.7 kg  05/09/18 78.3 kg  04/21/18 74.5 kg     Intake/Output Summary (Last 24 hours) at 08/07/2018 1306 Last data filed at 08/07/2018 0939 Gross per 24 hour  Intake 250 ml  Output 0 ml  Net 250 ml    Physical Exam:   GENERAL: Drowsy HEAD, EYES, EARS, NOSE AND THROAT: Atraumatic, normocephalic. Extraocular muscles are intact. Pupils equal and reactive to light. Sclerae anicteric. No conjunctival injection. No oro-pharyngeal erythema.  NECK: Supple. There is no jugular venous distention. No bruits, no lymphadenopathy, no thyromegaly.  HEART: Regular rate and rhythm,. No murmurs, no rubs, no clicks.  LUNGS: Clear to auscultation bilaterally. No rales or rhonchi. No wheezes.  ABDOMEN: Soft, flat, nontender, nondistended. Has good bowel sounds. No hepatosplenomegaly appreciated.  EXTREMITIES: No evidence of any cyanosis, clubbing, or peripheral edema.  +2 pedal and radial pulses bilaterally.  NEUROLOGIC: Drowsy  sKIN: Moist and warm with no rashes appreciated.   Psych: Not anxious, depressed LN: No inguinal LN enlargement    Antibiotics   Anti-infectives (From admission, onward)   Start     Dose/Rate Route Frequency Ordered Stop   08/07/18 1200  ceFEPIme (MAXIPIME) 2 g in sodium chloride 0.9 % 100 mL IVPB     2 g 200 mL/hr over 30 Minutes Intravenous Every M-W-F (Hemodialysis) 07/28/2018 1250     08/07/18 1200  vancomycin (VANCOCIN) IVPB 750 mg/150 ml premix     750 mg 150 mL/hr over 60 Minutes Intravenous Every M-W-F (Hemodialysis) 08/15/2018 1250     07/29/2018  1415  vancomycin (VANCOCIN) IVPB 750 mg/150 ml premix     750 mg 150 mL/hr over 60 Minutes Intravenous  Once 08/19/2018 1250 08/15/2018 1600   07/29/2018 0730  ceFEPIme (MAXIPIME) 2 g in sodium chloride 0.9 % 100 mL IVPB     2 g 200 mL/hr over 30 Minutes Intravenous  Once 08/18/2018 0715 07/30/2018 0826   08/01/2018 0730  metroNIDAZOLE (FLAGYL) IVPB 500 mg     500 mg 100 mL/hr over 60 Minutes Intravenous  Once 07/30/2018 0715 07/31/2018 0908   08/04/2018 0730  vancomycin (VANCOCIN) IVPB 1000 mg/200 mL premix     1,000 mg 200 mL/hr over 60 Minutes Intravenous  Once 08/16/2018 0715 08/21/2018 0908      Medications   Scheduled Meds: . aspirin EC  81 mg Oral Daily  . atorvastatin  40 mg Oral Daily  . benzonatate  100 mg Oral TID  . colchicine  0.6 mg Oral Daily  . docusate sodium  100 mg Oral Daily  . heparin  5,000 Units Subcutaneous Q8H  . ketorolac  15 mg Intravenous Q8H  . mouth rinse  15 mL Mouth Rinse BID  . midodrine  10 mg Oral TID WC  . sodium chloride flush  3 mL Intravenous Q12H   Continuous Infusions: . sodium chloride    . ceFEPime (MAXIPIME) IV    . vancomycin     PRN Meds:.sodium chloride, acetaminophen **OR** acetaminophen, diphenhydrAMINE, guaiFENesin, ipratropium-albuterol, nitroGLYCERIN, ondansetron **OR** ondansetron (ZOFRAN) IV, oxyCODONE-acetaminophen, senna-docusate, sodium chloride flush   Data Review:   Micro Results Recent Results (from the past 240 hour(s))  Blood  culture (routine x 2)     Status: None (Preliminary result)   Collection Time: 07/29/2018  7:46 AM   Specimen: BLOOD  Result Value Ref Range Status   Specimen Description BLOOD RFA  Final   Special Requests   Final    BOTTLES DRAWN AEROBIC AND ANAEROBIC Blood Culture results may not be optimal due to an excessive volume of blood received in culture bottles   Culture   Final    NO GROWTH < 24 HOURS Performed at [facility], [address] [place], [place] [address]    Report Status PENDING  Incomplete  Blood culture (routine x 2)     Status: None (Preliminary result)   Collection Time: 08/01/2018  7:47 AM   Specimen: BLOOD  Result Value Ref Range Status   Specimen Description BLOOD R AC  Final   Special Requests   Final    BOTTLES DRAWN AEROBIC AND ANAEROBIC Blood Culture results may not be optimal due to an excessive volume of blood received in culture bottles   Culture   Final    NO GROWTH < 24 HOURS Performed at [facility], [address] [place], [place] [address]    Report Status PENDING  Incomplete  SARS Coronavirus 2 (CEPHEID- Performed in [facility] hospital lab), Hosp Order     Status: None   Collection Time: 07/26/2018  8:34 AM   Specimen: Nasopharyngeal Swab  Result Value Ref Range Status   SARS Coronavirus 2 NEGATIVE NEGATIVE Final    Comment: (NOTE) If result is NEGATIVE SARS-CoV-2 target nucleic acids are NOT DETECTED. The SARS-CoV-2 RNA is generally detectable in upper and lower  respiratory specimens during the acute phase of infection. The lowest  concentration of SARS-CoV-2 viral copies this assay can detect is 250  copies / mL. A negative result does not preclude SARS-CoV-2 infection  and should  not be used as the sole basis for treatment or other  patient management decisions.  A negative result may occur with  improper specimen collection / handling, submission of specimen other  than nasopharyngeal swab, presence of viral mutation(s)  within the  areas targeted by this assay, and inadequate number of viral copies  (<250 copies / mL). A negative result must be combined with clinical  observations, patient history, and epidemiological information. If result is POSITIVE SARS-CoV-2 target nucleic acids are DETECTED. The SARS-CoV-2 RNA is generally detectable in upper and lower  respiratory specimens dur ing the acute phase of infection.  Positive  results are indicative of active infection with SARS-CoV-2.  Clinical  correlation with patient history and other diagnostic information is  necessary to determine patient infection status.  Positive results do  not rule out bacterial infection or co-infection with other viruses. If result is PRESUMPTIVE POSTIVE SARS-CoV-2 nucleic acids MAY BE PRESENT.   A presumptive positive result was obtained on the submitted specimen  and confirmed on repeat testing.  While 2019 novel coronavirus  (SARS-CoV-2) nucleic acids may be present in the submitted sample  additional confirmatory testing may be necessary for epidemiological  and / or clinical management purposes  to differentiate between  SARS-CoV-2 and other Sarbecovirus currently known to infect humans.  If clinically indicated additional testing with an alternate test  methodology [phone]) is advised. The SARS-CoV-2 RNA is generally  detectable in upper and lower respiratory sp ecimens during the acute  phase of infection. The expected result is Negative. Fact Sheet for Patients:  StrictlyIdeas.no Fact Sheet for Healthcare Providers: BankingDealers.co.za This test is not yet approved or cleared by the [place] FDA and has been authorized for detection and/or diagnosis of SARS-CoV-2 by FDA under an Emergency Use Authorization (EUA).  This EUA will remain in effect (meaning this test can be used) for the duration of the COVID-19 declaration under Section 564(b)(1) of the Act,  21 U.S.C. section 360bbb-3(b)(1), unless the authorization is terminated or revoked sooner. Performed at [facility], [address] [place], [place] [address]   MRSA PCR Screening     Status: None   Collection Time: 08/09/2018  1:50 PM   Specimen: Nasal Mucosa; Nasopharyngeal  Result Value Ref Range Status   MRSA by PCR NEGATIVE NEGATIVE Final    Comment:        The GeneXpert MRSA Assay (FDA approved for NASAL specimens only), is one component of a comprehensive MRSA colonization surveillance program. It is not intended to diagnose MRSA infection nor to guide or monitor treatment for MRSA infections. Performed at [facility], [address] [place], [place] [address]     Radiology Reports Dg Chest 2 View  Result Date: 08/18/2018 CLINICAL DATA:  New cough and shortness of breath EXAM: CHEST - 2 VIEW COMPARISON:  May 10, 2018 FINDINGS: Vascular stent projects over the left subclavian region. Stable cardiomegaly. The hila, mediastinum, lungs, and pleura are normal. IMPRESSION: Persistent cardiomegaly.  No other abnormalities. Electronically Signed   By: Dorise Bullion III M.D   On: 08/10/2018 08:00   Ct Angio Chest Pe W And/or Wo Contrast  Result Date: 08/21/2018 CLINICAL DATA:  Chest pain. EXAM: CT ANGIOGRAPHY CHEST WITH CONTRAST TECHNIQUE: Multidetector CT imaging of the chest was performed using the standard protocol during bolus administration of intravenous contrast. Multiplanar CT image reconstructions and MIPs were obtained to evaluate the vascular anatomy. CONTRAST:  68mL OMNIPAQUE IOHEXOL 350 MG/ML SOLN COMPARISON:  Chest x-ray August 05, 2018.  Chest CT Jul 04, 2016. FINDINGS: Cardiovascular: Cardiomegaly is noted. A stent is seen in the left brachiocephalic vein. Patency cannot be evaluated due to lack of contrast in these vessels due to a right-sided injection. Atherosclerotic changes are seen in the aorta. The ascending thoracic aorta measures up to  4.4 cm, unchanged since May of 2018. No dissection identified. The main pulmonary artery measures 3.7 cm. No pulmonary emboli. Mediastinum/Nodes: The thyroid and esophagus are normal. No adenopathy. Tiny pleural effusions are noted. No pericardial effusion. Lungs/Pleura: Left retrocardiac atelectasis is noted. No other suspicious infiltrates. Central airways are normal. No pneumothorax. Upper Abdomen: No acute abnormality. Musculoskeletal: No chest wall abnormality. No acute or significant osseous findings. Review of the MIP images confirms the above findings. IMPRESSION: 1. No pulmonary emboli identified. 2. Cardiomegaly.  Tiny pleural effusions. 3. There is a stent in the left brachiocephalic vein. Patency cannot be evaluated due to a right-sided injection. 4. Atherosclerotic changes in the aorta. 5. 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 Recommend annual imaging followup by CTA or MRA. This recommendation follows 2010 ACCF/AHA/AATS/ACR/ASA/SCA/SCAI/SIR/STS/SVM Guidelines for the Diagnosis and Management of Patients with Thoracic Aortic Disease. Circulation. 2010; 121: T557-D220. Aortic aneurysm NOS (ICD10-I71.9) 6. The main pulmonary artery measures 3.7 cm which raises the possibility of pulmonary arterial hypertension. 7. Mild left basilar atelectasis in the retrocardiac region. Aortic Atherosclerosis (ICD10-I70.0). Electronically Signed   By: Dorise Bullion III M.D   On: 08/12/2018 10:29   Ct Angio Chest/abd/pel For Dissection W And/or Wo Contrast  Result Date: 07/27/2018 CLINICAL DATA:  Chest pain for 4 days.  Cough. EXAM: CT ANGIOGRAPHY CHEST, ABDOMEN AND PELVIS TECHNIQUE: Multidetector CT imaging through the chest, abdomen and pelvis was performed using the standard protocol during bolus administration of intravenous contrast. Multiplanar reconstructed images and MIPs were obtained and reviewed to evaluate the vascular anatomy. CONTRAST:  167mL ISOVUE-370  IOPAMIDOL (ISOVUE-370) INJECTION 76% COMPARISON:  CT of the chest August 05, 2018. CT of the abdomen and pelvis May 04, 2018 FINDINGS: CTA CHEST FINDINGS Cardiovascular: Cardiomegaly. Coronary artery calcifications identified. The pulmonary arteries were better assessed on the recent CT a of the chest for PE. No pulmonary emboli in the central pulmonary arteries. The patient had a normal PE study earlier today as well. Atherosclerotic changes are seen in the thoracic aorta without dissection. The ascending thoracic aorta measures 4.2 cm on today's study, mildly aneurysmal. Mediastinum/Nodes: There is a stent in the left brachiocephalic vein which is well opacified on this study. The thyroid and esophagus are normal. A borderline node anterior to the trachea measuring 14 mm on axial image 40 is stable since 2018, likely reactive. No other evidence of adenopathy identified. Small pleural effusions. No pericardial effusion. Mild increased attenuation in the subcutaneous fat suggests volume overload. Lungs/Pleura: Tiny pleural effusions. Central airways are normal. Mild atelectasis in the posterior left upper lobe. Mild atelectasis in the retrocardiac region, stable. No pneumothorax. Central airways are unchanged no suspicious pulmonary nodules, masses, or focal infiltrates. Musculoskeletal: There is a healed anterior left rib fracture. No acute bony abnormalities. Review of the MIP images confirms the above findings. CTA ABDOMEN AND PELVIS FINDINGS VASCULAR Aorta: Atherosclerotic change is seen in the nonaneurysmal abdominal aorta, extending into the iliac and femoral vessels. No dissection. Celiac: Patent without evidence of aneurysm, dissection, vasculitis or significant stenosis. SMA: Atherosclerotic changes near the origin of the SMA without significant stenosis. No other abnormalities. Renals:  Atherosclerosis at the origin of the bilateral renal arteries is identified. IMA: Patent without evidence of aneurysm,  dissection, vasculitis or significant stenosis. Inflow: Patent without evidence of aneurysm, dissection, vasculitis or significant stenosis. Veins: No obvious venous abnormality within the limitations of this arterial phase study. Review of the MIP images confirms the above findings. NON-VASCULAR Hepatobiliary: No focal liver abnormality is seen. No gallstones, gallbladder wall thickening, or biliary dilatation. Pancreas: Unremarkable. No pancreatic ductal dilatation or surrounding inflammatory changes. Spleen: Normal in size without focal abnormality. Adrenals/Urinary Tract: Adrenal glands are normal. The kidneys are atrophic consistent with renal disease. No hydronephrosis or acute perinephric stranding. The ureters are normal. The bladder is poorly distended but unremarkable. Stomach/Bowel: The stomach and small bowel are unremarkable. The colon is normal. The appendix is unremarkable. Lymphatic: No significant vascular findings are present. No enlarged abdominal or pelvic lymph nodes. Reproductive: Prostate is unremarkable. Other: There is increased attenuation in the subcutaneous fat and in the intra-abdominal fat consistent volume overload. There is a small amount of fluid in the right pericolic gutter. Some of this fluid is adjacent to the appendix but there is no convincing evidence of appendicitis. Musculoskeletal: No acute or significant osseous findings. Review of the MIP images confirms the above findings. IMPRESSION: 1. 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 This is stable since 2018. No dissection. Atherosclerotic changes noted. Recommend annual imaging followup by CTA or MRA. This recommendation follows 2010 ACCF/AHA/AATS/ACR/ASA/SCA/SCAI/SIR/STS/SVM Guidelines for the Diagnosis and Management of Patients with Thoracic Aortic Disease. Circulation. 2010; 121: J242-A834. Aortic aneurysm NOS (ICD10-I71.9) 2. Cardiomegaly.  Coronary artery  calcifications. 3. Increased attenuation in the subcutaneous fat diffusely as well as in the intra-abdominal fat with a small amount of fluid in the right pericolic gutter. These findings are most consistent with volume overload. 4. Tiny bilateral pleural effusions. Electronically Signed   By: Dorise Bullion III M.D   On: 08/17/2018 11:36     CBC Recent Labs  Lab 07/29/2018 0710 08/06/18 0045  WBC 7.3 5.9  HGB 10.1* 10.4*  HCT 31.6* 32.9*  PLT 150 114*  MCV 87.8 88.0  MCH 28.1 27.8  MCHC 32.0 31.6  RDW 19.1* 19.5*  LYMPHSABS 0.8  --   MONOABS 1.8*  --   EOSABS 0.1  --   BASOSABS 0.1  --     Chemistries  Recent Labs  Lab 08/20/2018 0710 08/06/18 0045 08/06/18 0303  NA 141 135 136  K 4.4 4.8 5.0  CL 105 100 101  CO2 23 19* 19*  GLUCOSE 78 88 80  BUN 45* 55* 57*  CREATININE 5.27* 6.14* 6.31*  CALCIUM 8.3* 8.2* 8.4*  MG  --  1.7 1.7  AST 26  --   --   ALT 21  --   --   ALKPHOS 81  --   --   BILITOT 1.0  --   --    ------------------------------------------------------------------------------------------------------------------ estimated creatinine clearance is 11.4 mL/min (A) (by C-G formula based on SCr of 6.31 mg/dL (H)). ------------------------------------------------------------------------------------------------------------------ No results for input(s): HGBA1C in the last 72 hours. ------------------------------------------------------------------------------------------------------------------ No results for input(s): CHOL, HDL, LDLCALC, TRIG, CHOLHDL, LDLDIRECT in the last 72 hours. ------------------------------------------------------------------------------------------------------------------ Recent Labs    08/14/2018 1833  TSH 2.410   ------------------------------------------------------------------------------------------------------------------ No results for input(s): VITAMINB12, FOLATE, FERRITIN, TIBC, IRON, RETICCTPCT in the last 72  hours.  Coagulation profile No results for input(s): INR, PROTIME in the last 168 hours.  No results for input(s): DDIMER  in the last 72 hours.  Cardiac Enzymes Recent Labs  Lab 08/04/2018 1833 08/06/18 0045 08/06/18 0303  TROPONINI 0.11* 0.18* 0.22*   ------------------------------------------------------------------------------------------------------------------ Invalid input(s): POCBNP    Assessment & Plan   IMPRESSION AND PLAN: Patient 63 year old with multiple medical problems presenting with chest pain and hypotension  1.  Hypotension etiology unclear Continue midodrine No evidence of infection discontinue IV antibiotics Check a cortisol level Continue midodrine  2.  Atrial flutter with intermittent bradycardia Possible sick sinus syndrome cardiology following  3.  Chest pain etiology unclear CT scan of the chest done on admission 2 days ago shows no evidence of dissection Symptoms atypical for coronary artery obstruction Treated with Toradol   4.  End-stage renal disease fluid removal per nephrology  5.  Diabetes type 2 continue sliding-scale insulin  6.  Hyperlipidemia continue Lipitor  7.  Miscellaneous heparin for DVT prophylaxis  7.  Chronic systolic and diastolic CHF Fluid removal per nephrology      Code Status Orders  (From admission, onward)         Start     Ordered   08/18/2018 1507  Full code  Continuous     08/10/2018 1506        Code Status History    Date Active Date Inactive Code Status Order ID Comments User Context   05/03/2018 1843 05/10/2018 1609 Full Code [id number]  Bettey Costa, MD ED   04/18/2018 0420 04/22/2018 0122 Full Code [id number]  Harrie Foreman, MD Inpatient   02/08/2018 0233 02/08/2018 2214 Full Code [id number]  Lance Coon, MD ED   03/14/2017 0007 03/14/2017 2204 Full Code [id number]  Amelia Jo, MD Inpatient   02/15/2017 2136 02/18/2017 1812 Full Code [id number]  Demetrios Loll, MD Inpatient   09/20/2016 0318  09/21/2016 1915 Full Code [id number]  Lance Coon, MD ED   08/22/2016 1658 08/24/2016 1607 Full Code [id number]  Demetrios Loll, MD Inpatient   07/04/2016 1929 07/05/2016 1728 Full Code [id number]  Idelle Crouch, MD Inpatient   06/07/2016 0102 06/08/2016 1514 Full Code [id number]  Hugelmeyer, [place], DO Inpatient   04/30/2016 0007 05/01/2016 1821 Full Code [id number]  Lance Coon, MD Inpatient   02/26/2016 2107 02/28/2016 1804 Full Code [id number]  Lance Coon, MD Inpatient   01/07/2016 0132 01/07/2016 1724 Full Code [id number]  Hugelmeyer, Alexis, DO ED   08/02/2015 2316 08/04/2015 2311 Full Code [id number]  Quintella Baton, MD Inpatient   12/02/2014 2052 12/04/2014 1753 Full Code [id number]  Vaughan Basta, MD Inpatient   11/10/2014 2323 11/12/2014 1506 Full Code [id number]  Gladstone Lighter, MD Inpatient   Advance Care Planning Activity           Consults   Nephrology, pulmonary critical   DVT Prophylaxis  scd's  Lab Results  Component Value Date   PLT 114 (L) 08/06/2018     Time Spent in minutes  36min  Greater than 50% of time spent in care coordination and counseling patient regarding the condition and plan of care.   Dustin Flock M.D on 08/07/2018 at 1:06 PM  Between 7am to 6pm - Pager - [phone]  After 6pm go to [email or web address] - Proofreader  [employer]   Office  [phone]

## 2018-08-07 NOTE — Progress Notes (Signed)
Notified Dr. Darvin Neighbours of pt's critical troponin coming back at 2.05... also updated him that new CT scans were back, pt HR still sustaining in the 30's pt still continues to be very lethargic and nauseated. Dr. Darvin Neighbours put in orders for STAT lactic & hgb... and said he would speak with ICU about maybe transferring pt. Will conintue to monitor. Conley Simmonds, RN, BSN

## 2018-08-07 NOTE — Progress Notes (Signed)
Pt transferred to ICU 18.. report called to Tammy. Tried to get in contact with both pt's son & daughter to update them, I was unable to reach either. Pt is aware.  Conley Simmonds, RN, BSN

## 2018-08-07 NOTE — Progress Notes (Signed)
Pt found non-responsive. RRT called. CBG is Low, unable to register on accu check. RRT give D50 amp. CBG is up to 72. MD orders D10 at 60ml/hr, ACHS CBGs and orders to recheck CBG in 30 minutes and at one hour. I will continue to assess.

## 2018-08-07 NOTE — Plan of Care (Signed)
  Problem: Clinical Measurements: Goal: Respiratory complications will improve Outcome: Progressing Note: On chronic O2   Problem: Nutrition: Goal: Adequate nutrition will be maintained Outcome: Progressing   Problem: Coping: Goal: Level of anxiety will decrease Outcome: Progressing   Problem: Elimination: Goal: Will not experience complications related to bowel motility Outcome: Progressing Note: Pt complained he felt constipated and wanted something to help move his bowels, MD paged, Dr. Jannifer Franklin put in orders for senokot.    Problem: Safety: Goal: Ability to remain free from injury will improve Outcome: Progressing   Problem: Skin Integrity: Goal: Risk for impaired skin integrity will decrease Outcome: Progressing   Problem: Pain Managment: Goal: General experience of comfort will improve Outcome: Not Progressing Note: Still complains of chest pain, treated once with morphine

## 2018-08-07 NOTE — Progress Notes (Signed)
MD Cardiology and rounding notified. Pts HR is now sustaining in the 40s SB and has dropped as low as 35. Pt is resting quietly in bed.  Pt does respond to voice and communicates with RN I will continue to assess.

## 2018-08-07 NOTE — Consult Note (Addendum)
Name: [name] MRN: [id number] DOB: [birth year]    ADMISSION DATE:  08/03/2018 CONSULTATION DATE: 08/07/2018  REFERRING MD : Dr. Darvin Neighbours   CHIEF COMPLAINT: Lethargy   BRIEF PATIENT DESCRIPTION:  63 yo male with ESRD on HD admitted to the stepdown unit with angina, new onset paroxymal atrial flutter with rvr, elevated troponin secondary to demand ischemia vs. NSTEMI and acute encephalopathy secondary to hypoglycemia and metabolic acidosis.    SIGNIFICANT EVENTS/STUDIES:  06/13-Pt admitted to the stepdown unit with new onset atrial flutter, hypotension, and possible sepsis  06/13-CTA Chest revealed no pulmonary emboli identified. Cardiomegaly. Tiny pleural effusions. There is a stent in the left brachiocephalic vein. Patency cannot be evaluated due to a right-sided injection. Atherosclerotic changes in the aorta. 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 Recommend annual imaging followup by CTA or MRA. This recommendation follows 2010 ACCF/AHA/AATS/ACR/ASA/SCA/SCAI/SIR/STS/SVM Guidelines for the Diagnosis and Management of Patients with Thoracic Aortic Disease. Circulation. 2010; 121: E268-T419. Aortic aneurysm NOS (ICD10-I71.9) The main pulmonary artery measures 3.7 cm which raises the possibility of pulmonary arterial hypertension. Mild left basilar atelectasis in the retrocardiac region. Aortic Atherosclerosis (ICD10-I70.0). 06/13-CTA Chest/Abd/Pelvis revealed Mild aneurysmal dilatation of the ascending thoracic aorta measuring 4.2 cm on this study. This is stable since 2018. No dissection. Atherosclerotic changes noted. Recommend annual imaging followup by CTA or MRA. This recommendation follows 2010 ACCF/AHA/AATS/ACR/ASA/SCA/SCAI/SIR/STS/SVM Guidelines for the Diagnosis and Management of Patients with Thoracic Aortic Disease. Circulation. 2010; 121: Q222-L798. Aortic aneurysm NOS (ICD10-I71.9) Cardiomegaly.  Coronary artery calcifications.  Increased attenuation in the subcutaneous fat diffusely as well as in the intra-abdominal fat with a small amount of fluid in the right pericolic gutter. These findings are most consistent with volume overload. Tiny bilateral pleural effusions. 06/13-Cardiology consulted for atrial flutter with rvr and elevated troponin.  Pt deemed not a good candidate for anticoagulation due to hx of GI bleed.  Recommended rate control with digoxin  06/14-Pt transferred to the telemetry unit  06/14-Echo revealed EF 55%-60% 06/15-Per cardiology concerned about possible tachybradycardia syndrome due to pt converting from atrial flutter to sinus bradycardia.  Pt deemed poor candidate for pacemaker placement or invasive ischemic evaluation, and still not a candidate for anticoagulation  06/15-Pt developed acute encephalopathy rapid response initiated pt hypoglycemia cbg <10 following administration of 2 amps of D50W pt became alert 06/15-CT Head revealed no acute intracranial pathology. Small, partially imaged air-fluid level of the left maxillary sinus. Correlate for evidence of sinusitis. Contrast opacification of the vascular structures and meninges, likely due to persistent circulating contrast in the setting of renal failure and hemodialysis. 06/15-CT Chest/Abd/Pelvis revealed enlarging bilateral pleural effusions since the prior CT scan from 2 days ago. Moderate on the right and small the left with overlying atelectasis. Stable cardiac enlargement and vascular congestion but no overt pulmonary edema. Reflux of contrast down the IVC consistent with right heart failure or tricuspid regurgitation. Changes of hepatic congestion are noted. Diffuse body wall edema, mesenteric edema and abdominal/pelvic ascites. The ascites in the abdomen/pelvis is high in attenuation suggesting complex fluid or hemorrhage. No obvious extravasating contrast is identified. Extensive vascular calcifications. Severe and aggressive degenerative  changes involving the lumbar spine. Amyloid spondyloarthropathy would be a consideration. 06/15-Pt became lethargic again abg revealed metabolic acidosis pt transferred to the stepdown unit for closer monitoring PCCM reconsulted   HISTORY OF PRESENT ILLNESS:   This is a 63 yo male with a PMH of ESRD on HD (M-W-F), PVC's, Pulmonary HTN, Dyspnea,  PVD, Myocardial Infarction, CHF, HTN, Type II Diabetes Mellitus, Chronic Combined Systolic and Diastolic CHF, Chronic Home O2 @2L , CAD, Hepatitis C, and Arthritis.  He presented to [facility] ER on 06/13 via EMS with c/o chest pain worse with coughing and shortness of breath.  Per ER notes the pt had an HD session on 06/12, he usually has 5L removed during HD, but due to hypotension dialysis nurse only able to remove 3L.  Due to worsening shortness of breath pt notified EMS. Upon arrival to the ER pt hypotensive sbp's in the 80's and hypoxic with increased work of breathing O2 sats 88-89%.  Initial EKG revealed sinus tachycardia, hr 115, diffuse nonspecific ST segment changes, and no ST elevation.  CTA Chest revealed cardiomegaly, tiny pleural effusions, mild aneurysmal dilatation of ascending aorta measuring up to 4.4 cm but negative PE. Lab results revealed BUN 45, creatinine 5.27, BNP 2,373, troponin 0.13, lactic acid 0.9, glucose 78, and COVID-19 negative.  CXR revealed cardiomegaly.  Due to hypotension pt admitted to the stepdown unit by hospitalist team for additional workup and treatment. Following gentle fluid resuscitation pts bp improved, and pt transferred to the telemetry unit 06/14.  On 06/15 pt developed acute encephalopathy secondary to severe hypoglycemia and metabolic acidosis requiring transfer back to the stepdown unit.  Detailed hospital course listed above.   PAST MEDICAL HISTORY :   has a past medical history of Arthritis, CAD (coronary artery disease), Chronic combined systolic and diastolic CHF (congestive heart failure) ([place]), Diabetes ([place]),  Dyspnea, ESRD (end stage renal disease) ([place]), Hepatitis, Hypertension, Hypertensive heart disease with CHF (congestive heart failure) ([place]), Mixed Ischemic and non-ischemic cardiomyopathy, Moderate Aortic insufficiency, Myocardial infarction [facility]), Peripheral vascular disease ([place]), Pulmonary hypertension ([place]), PVC's (premature ventricular contractions), and Renal insufficiency.  has a past surgical history that includes AV fistula placement (Left, 2014); Cardiac catheterization (N/A, 10/03/2014); Cardiac catheterization (Left, 10/03/2014); Cardiac catheterization (Left, 05/01/2015); Cardiac catheterization (N/A, 05/01/2015); A/V Fistulagram (Left, 04/06/2016); A/V SHUNT INTERVENTION (N/A, 04/06/2016); Upper Extremity Angiography (Bilateral, 06/29/2016); UPPER EXTREMITY INTERVENTION (06/29/2016); A/V SHUNT INTERVENTION (N/A, 06/29/2016); DIALYSIS/PERMA CATHETER INSERTION (Right); A/V Fistulagram (Left, 01/19/2017); UPPER EXTREMITY VENOGRAPHY (Left, 03/15/2017); AV fistula placement (Left, 06/01/2017); DIALYSIS/PERMA CATHETER REMOVAL (N/A, 07/05/2017); Esophagogastroduodenoscopy (egd) with propofol (N/A, 04/20/2018); Esophagogastroduodenoscopy (N/A, 05/04/2018); and EMBOLIZATION (N/A, 05/09/2018). Prior to Admission medications   Medication Sig Start Date End Date Taking? Authorizing Provider  atorvastatin (LIPITOR) 40 MG tablet Take 40 mg by mouth daily. 07/26/18  Yes [provider]  carvedilol (COREG) 3.125 MG tablet Take 3.125 mg by mouth 2 (two) times a day. 07/27/18  Yes [provider]  ENTRESTO 24-26 MG Take 1 tablet by mouth 2 (two) times a day. 07/26/18  Yes [provider]  furosemide (LASIX) 40 MG tablet Take 40 mg by mouth 2 (two) times a day. 07/26/18  Yes [provider]  hydrALAZINE (APRESOLINE) 100 MG tablet Take 100 mg by mouth daily. 07/26/18  Yes [provider]  isosorbide mononitrate (IMDUR) 30 MG 24 hr tablet Take 30 mg by mouth daily. 07/26/18  Yes [provider]  pantoprazole (PROTONIX) 40 MG tablet Take 40 mg by mouth 2 (two) times a day. 07/26/18  Yes [provider]  ceFEPIme 500 mg in dextrose 5 % 50 mL Inject 500 mg into the vein daily. 05/11/18   Bradly Bienenstock, NP  chlorhexidine (PERIDEX) 0.12 % solution 15 mLs by Mouth Rinse route 2 (two) times daily. 05/10/18   Bradly Bienenstock, NP  Chlorhexidine Gluconate  Cloth 2 % PADS Apply 6 each topically daily. 05/10/18   Bradly Bienenstock, NP  epoetin alfa (EPOGEN,PROCRIT) 56389 UNIT/ML injection Inject 0.4 mLs (4,000 Units total) into the vein every Monday, Wednesday, and Friday with hemodialysis. 05/10/18   Darel Hong D, NP  haloperidol lactate (HALDOL) 5 MG/ML injection Inject 0.2 mLs (1 mg total) into the vein every 6 (six) hours as needed (for agitated delirium). 05/10/18   Darel Hong D, NP  ipratropium-albuterol (DUONEB) 0.5-2.5 (3) MG/3ML SOLN Take 3 mLs by nebulization every 4 (four) hours as needed. 05/10/18   Bradly Bienenstock, NP  LORazepam (ATIVAN) 2 MG/ML injection Inject 0.25 mLs (0.5 mg total) into the vein every 4 (four) hours as needed for anxiety. 05/10/18   Darel Hong D, NP  morphine 2 MG/ML injection Inject 0.5 mLs (1 mg total) into the vein every 2 (two) hours as needed (chest pain). 05/10/18   Bradly Bienenstock, NP  mouth rinse LIQD solution 15 mLs by Mouth Rinse route 2 times daily at 12 noon and 4 pm. 05/10/18   Bradly Bienenstock, NP  nitroGLYCERIN (NITROSTAT) 0.4 MG SL tablet Place 1 tablet (0.4 mg total) under the tongue every 5 (five) minutes as needed for chest pain. 05/10/18   Bradly Bienenstock, NP  ondansetron (ZOFRAN) 4 MG/2ML SOLN injection Inject 2 mLs (4 mg total) into the vein every 6 (six) hours as needed for nausea. 05/10/18   Bradly Bienenstock, NP  ondansetron [facility]) 4 MG/2ML SOLN injection Inject 2 mLs (4 mg total) into the vein every 6 (six) hours as needed for nausea, vomiting or refractory nausea / vomiting. 05/10/18   Bradly Bienenstock, NP    pantoprazole (PROTONIX) 40 MG injection Inject 40 mg into the vein every 12 (twelve) hours. 05/13/18   Darel Hong D, NP  pantoprazole 80 mg in sodium chloride 0.9 % 250 mL Inject 8 mg/hr into the vein continuous. 05/10/18   Darel Hong D, NP  phenylephrine 10 mg in sodium chloride 0.9 % 250 mL Inject 0-400 mcg/min into the vein continuous. 05/10/18   Bradly Bienenstock, NP  traMADol (ULTRAM) 50 MG tablet Take 1 tablet (50 mg total) by mouth every 6 (six) hours as needed for moderate pain or severe pain. 04/21/18   Nicholes Mango, MD  Vancomycin (VANCOCIN) 750-5 MG/150ML-% SOLN Inject 150 mLs (750 mg total) into the vein every Monday, Wednesday, and Friday with hemodialysis. 05/10/18   Bradly Bienenstock, NP  vitamin C (VITAMIN C) 500 MG tablet Take 1 tablet (500 mg total) by mouth 2 (two) times daily. 05/10/18   Bradly Bienenstock, NP   Allergies  Allergen Reactions   Sulfa Antibiotics Shortness Of Breath, Itching and Rash    FAMILY HISTORY:  family history includes Diabetes in his mother, sister, and son; Hypertension in his mother and son. SOCIAL HISTORY:  reports that he quit smoking about 21 months ago. His smoking use included cigarettes. He has a 10.00 pack-year smoking history. He has never used smokeless tobacco. He reports current drug use. Drugs: Cocaine and Marijuana. He reports that he does not drink alcohol.  REVIEW OF SYSTEMS: Positives in BOLD  Constitutional: Negative for fever, chills, weight loss, malaise/fatigue and diaphoresis.  HENT: Negative for hearing loss, ear pain, nosebleeds, congestion, sore throat, neck pain, tinnitus and ear discharge.   Eyes: Negative for blurred vision, double vision, photophobia, pain, discharge and redness.  Respiratory: cough, hemoptysis, sputum production, shortness of breath, wheezing and stridor Cardiovascular:  Negative for chest pain, palpitations, orthopnea, claudication, leg swelling and PND.  Gastrointestinal: Negative for heartburn,  nausea, vomiting, abdominal pain, diarrhea, constipation, blood in stool and melena.  Genitourinary: Negative for dysuria, urgency, frequency, hematuria and flank pain.  Musculoskeletal: severe right arm pain with radiation to the right shoulder, myalgias, back pain, joint pain and falls.  Skin: Negative for itching and rash.  Neurological: Negative for dizziness, tingling, tremors, sensory change, speech change, focal weakness, seizures, loss of consciousness, weakness and headaches.  Endo/Heme/Allergies: Negative for environmental allergies and polydipsia. Does not bruise/bleed easily.  SUBJECTIVE:  Pt c/o severe right arm back with radiation to the right shoulder   VITAL SIGNS: Temp:  [97.9 F (36.6 C)-98.3 F (36.8 C)] 97.9 F (36.6 C) (06/15 1944) Pulse Rate:  [40-95] 41 (06/15 1944) Resp:  [18-20] 20 (06/15 1944) BP: (96-108)/(30-70) 103/53 (06/15 1944) SpO2:  [97 %-100 %] 100 % (06/15 1944) Weight:  [76.7 kg] 76.7 kg (06/15 0334)  PHYSICAL EXAMINATION: General: acutely ill appearing male, in mild respiratory distress  Neuro: alert and oriented, follows commands  HEENT: supple, no JVD  Cardiovascular: sinus bradycardia, no R/G Lungs: rhonchi with expiratory wheezes throughout, slightly labored  Abdomen: +BS x4, soft, non tender, non distended  Musculoskeletal: trace bilateral lower extremity edema  Skin: Left upper arm av fistula positive for bruit and thrill   Recent Labs  Lab 07/28/2018 0710 08/06/18 0045 08/06/18 0303  NA 141 135 136  K 4.4 4.8 5.0  CL 105 100 101  CO2 23 19* 19*  BUN 45* 55* 57*  CREATININE 5.27* 6.14* 6.31*  GLUCOSE 78 88 80   Recent Labs  Lab 08/02/2018 0710 08/06/18 0045  HGB 10.1* 10.4*  HCT 31.6* 32.9*  WBC 7.3 5.9  PLT 150 114*   Ct Head Wo Contrast  Result Date: 08/07/2018 CLINICAL DATA:  Altered mental status EXAM: CT HEAD WITHOUT CONTRAST TECHNIQUE: Contiguous axial images were obtained from the base of the skull through the  vertex without intravenous contrast. COMPARISON:  05/04/2018 FINDINGS: Brain: No evidence of acute infarction, hemorrhage, hydrocephalus, extra-axial collection or mass lesion/mass effect. Vascular: No hyperdense vessel or unexpected calcification. Skull: Normal. Negative for fracture or focal lesion. Sinuses/Orbits: Small, partially imaged air-fluid level of the left maxillary sinus. Other: Contrast opacification of the vascular structures and meninges, likely due to persistent circulating contrast in the setting of renal failure and hemodialysis. IMPRESSION: 1.  No acute intracranial pathology. 2. Small, partially imaged air-fluid level of the left maxillary sinus. Correlate for evidence of sinusitis. 3. Contrast opacification of the vascular structures and meninges, likely due to persistent circulating contrast in the setting of renal failure and hemodialysis. Electronically Signed   By: Eddie Candle M.D.   On: 08/07/2018 16:31   Ct Chest W Contrast  Result Date: 08/07/2018 CLINICAL DATA:  Chest pain and shortness of breath, abdominal pain and distension today. EXAM: CT CHEST, ABDOMEN, AND PELVIS WITH CONTRAST TECHNIQUE: Multidetector CT imaging of the chest, abdomen and pelvis was performed following the standard protocol during bolus administration of intravenous contrast. CONTRAST:  152mL OMNIPAQUE IOHEXOL 300 MG/ML  SOLN COMPARISON:  Chest CT 08/22/2018 and abdominal CT scan 05/04/2018 FINDINGS: CT CHEST FINDINGS Cardiovascular: The heart is enlarged but stable. No pericardial effusion. Stable tortuosity, ectasia and heavy calcification of the thoracic aorta. Stable three-vessel coronary artery calcifications. Stable left brachiocephalic vein stent. Enlarged pulmonary arteries consistent with pulmonary hypertension. Mediastinum/Nodes: Stable scattered mediastinal and hilar lymph nodes but no mass or overt adenopathy.  The esophagus is grossly normal. Lungs/Pleura: Small to moderate-sized right effusion and  small diffusion both larger when compared to prior recent chest CT. There is also overlying atelectasis. No overt pulmonary edema, focal pulmonary infiltrates or worrisome pulmonary lesions. Musculoskeletal: No chest wall masses identified. There is diffuse body wall edema noted suggesting anasarca. No supraclavicular or axillary lymphadenopathy the. The bony structures are unremarkable and appears stable. CT ABDOMEN PELVIS FINDINGS Hepatobiliary: Moderate reflux of contrast down the IVC and into the hepatic veins suggesting right heart failure or tricuspid regurgitation. Liver demonstrates changes suggesting hepatic congestion. No worrisome hepatic lesions or intrahepatic biliary dilatation. Contrast noted in the gallbladder related to the recent chest CT with contrast. Pancreas: No mass, inflammation or ductal dilatation. Spleen: Normal size.  No focal lesions. Adrenals/Urinary Tract: The adrenal glands are unremarkable and stable. Small kidneys with extensive arterial calcifications. No worrisome renal lesions. The bladder is grossly normal. Stomach/Bowel: The stomach, duodenum, small bowel and colon are grossly normal. No obvious acute inflammatory process, mass lesion or obstructive findings. Vascular/Lymphatic: Heavy atherosclerotic calcifications involving the aorta and branch vessels, particularly the renal arteries. Stable scattered mesenteric and retroperitoneal lymph nodes but no mass or overt adenopathy. Reproductive: The prostate gland and seminal vesicles are unremarkable. Other: Diffuse mesenteric edema and small volume abdominal/pelvic ascites. The pelvic fluid demonstrates higher attenuation which could suggest hemorrhage or peritonitis. No obvious active extravasation of contrast material. Coils are noted in the region of the gastroduodenal artery from previous embolization procedure. Musculoskeletal: Severe/aggressive degenerative changes involving the lumbar spine. Amyloid spondyloarthropathy  would certainly be a consideration given the patient's history of renal dialysis. IMPRESSION: 1. Enlarging bilateral pleural effusions since the prior CT scan from 2 days ago. Moderate on the right and small the left with overlying atelectasis. 2. Stable cardiac enlargement and vascular congestion but no overt pulmonary edema. 3. Reflux of contrast down the IVC consistent with right heart failure or tricuspid regurgitation. Changes of hepatic congestion are noted. 4. Diffuse body wall edema, mesenteric edema and abdominal/pelvic ascites. The ascites in the abdomen/pelvis is high in attenuation suggesting complex fluid or hemorrhage. No obvious extravasating contrast is identified. 5. Extensive vascular calcifications. 6. Severe and aggressive degenerative changes involving the lumbar spine. Amyloid spondyloarthropathy would be a consideration. Electronically Signed   By: Marijo Sanes M.D.   On: 08/07/2018 19:37   Ct Abdomen Pelvis W Contrast  Result Date: 08/07/2018 CLINICAL DATA:  Chest pain and shortness of breath, abdominal pain and distension today. EXAM: CT CHEST, ABDOMEN, AND PELVIS WITH CONTRAST TECHNIQUE: Multidetector CT imaging of the chest, abdomen and pelvis was performed following the standard protocol during bolus administration of intravenous contrast. CONTRAST:  157mL OMNIPAQUE IOHEXOL 300 MG/ML  SOLN COMPARISON:  Chest CT 07/29/2018 and abdominal CT scan 05/04/2018 FINDINGS: CT CHEST FINDINGS Cardiovascular: The heart is enlarged but stable. No pericardial effusion. Stable tortuosity, ectasia and heavy calcification of the thoracic aorta. Stable three-vessel coronary artery calcifications. Stable left brachiocephalic vein stent. Enlarged pulmonary arteries consistent with pulmonary hypertension. Mediastinum/Nodes: Stable scattered mediastinal and hilar lymph nodes but no mass or overt adenopathy. The esophagus is grossly normal. Lungs/Pleura: Small to moderate-sized right effusion and small  diffusion both larger when compared to prior recent chest CT. There is also overlying atelectasis. No overt pulmonary edema, focal pulmonary infiltrates or worrisome pulmonary lesions. Musculoskeletal: No chest wall masses identified. There is diffuse body wall edema noted suggesting anasarca. No supraclavicular or axillary lymphadenopathy the. The bony structures are unremarkable and appears  stable. CT ABDOMEN PELVIS FINDINGS Hepatobiliary: Moderate reflux of contrast down the IVC and into the hepatic veins suggesting right heart failure or tricuspid regurgitation. Liver demonstrates changes suggesting hepatic congestion. No worrisome hepatic lesions or intrahepatic biliary dilatation. Contrast noted in the gallbladder related to the recent chest CT with contrast. Pancreas: No mass, inflammation or ductal dilatation. Spleen: Normal size.  No focal lesions. Adrenals/Urinary Tract: The adrenal glands are unremarkable and stable. Small kidneys with extensive arterial calcifications. No worrisome renal lesions. The bladder is grossly normal. Stomach/Bowel: The stomach, duodenum, small bowel and colon are grossly normal. No obvious acute inflammatory process, mass lesion or obstructive findings. Vascular/Lymphatic: Heavy atherosclerotic calcifications involving the aorta and branch vessels, particularly the renal arteries. Stable scattered mesenteric and retroperitoneal lymph nodes but no mass or overt adenopathy. Reproductive: The prostate gland and seminal vesicles are unremarkable. Other: Diffuse mesenteric edema and small volume abdominal/pelvic ascites. The pelvic fluid demonstrates higher attenuation which could suggest hemorrhage or peritonitis. No obvious active extravasation of contrast material. Coils are noted in the region of the gastroduodenal artery from previous embolization procedure. Musculoskeletal: Severe/aggressive degenerative changes involving the lumbar spine. Amyloid spondyloarthropathy would  certainly be a consideration given the patient's history of renal dialysis. IMPRESSION: 1. Enlarging bilateral pleural effusions since the prior CT scan from 2 days ago. Moderate on the right and small the left with overlying atelectasis. 2. Stable cardiac enlargement and vascular congestion but no overt pulmonary edema. 3. Reflux of contrast down the IVC consistent with right heart failure or tricuspid regurgitation. Changes of hepatic congestion are noted. 4. Diffuse body wall edema, mesenteric edema and abdominal/pelvic ascites. The ascites in the abdomen/pelvis is high in attenuation suggesting complex fluid or hemorrhage. No obvious extravasating contrast is identified. 5. Extensive vascular calcifications. 6. Severe and aggressive degenerative changes involving the lumbar spine. Amyloid spondyloarthropathy would be a consideration. Electronically Signed   By: Marijo Sanes M.D.   On: 08/07/2018 19:37    ASSESSMENT / PLAN:  New onset paroxysmal atrial flutter converted to sinus bradycardia  Elevated troponin secondary to demand ischemia vs. NSTEMI  Cardiogenic vs. Septic Shock  Stable angina reported recent cocaine use  Hx: Cocaine Abuse, CAD, MI, Chronic combined systolic and diastolic CHF Continuous telemetry monitoring  Trend troponin's  Maintain map >60 Continue midodrine  Cardiology consulted appreciate input (concerned about possible tachybradycardia syndrome)-pt deemed not a candidate for invasive ischemic evaluation and poor candidate for pacemaker placement  Will NOT start heparin drip per cardiology recommendations-pt not a candidate for anticoagulation due to profound GI Bleed hx 3 months ago at [facility] that led to cardiac arrest Hold antiarrhythmic and antihypertensive medications for now  Continue atorvastatin  Prn percocet for pain management  Once mentation improves will provide cocaine abuse cessation counseling   ESRD on HD Trend BMP  Replace electrolytes as indicated    Nephrology consulted appreciate input-HD scheduled tonight 06/15 Avoid nephrotoxic medications   History of recent GI bleed  VTE px: SCD's, hold chemical prophylaxis for now due to recent hx of severe GI bleed  Trend CBC  Monitor for s/sx of bleeding and transfuse for hgb <7  Bronchitis  Trend WBC and monitor fever curve Follow cultures  Continue cefepime will discontinue vancomycin  Acute encephalopathy secondary to severe hypoglycemia and metabolic acidosis   Will increase D10W gtt to 75 ml/hr  CBG's q1hr for now  HD per Nephrology recommendations   Marda Stalker, [place] Pager [phone] (please enter 7 digits) PCCM  Consult Pager [phone] (please enter 7 digits)

## 2018-08-07 NOTE — Progress Notes (Signed)
Progress Note  Patient Name: [name] Date of Encounter: 08/07/2018  Primary Cardiologist: Nelva Bush, MD   Subjective   He reports feeling worse today as compared with yesterday.  Specifically, he reports fatigue and back pain.  He stated that he was unable to eat his breakfast this morning.  Inpatient Medications    Scheduled Meds: . aspirin EC  81 mg Oral Daily  . atorvastatin  40 mg Oral Daily  . benzonatate  100 mg Oral TID  . docusate sodium  100 mg Oral Daily  . heparin  5,000 Units Subcutaneous Q8H  . mouth rinse  15 mL Mouth Rinse BID  . midodrine  10 mg Oral TID WC  . sodium chloride flush  3 mL Intravenous Q12H   Continuous Infusions: . sodium chloride    . ceFEPime (MAXIPIME) IV    . vancomycin     PRN Meds: sodium chloride, acetaminophen **OR** acetaminophen, diphenhydrAMINE, guaiFENesin, ipratropium-albuterol, nitroGLYCERIN, ondansetron **OR** ondansetron (ZOFRAN) IV, senna-docusate, sodium chloride flush   Vital Signs    Vitals:   08/06/18 2325 08/07/18 0334 08/07/18 0740 08/07/18 1018  BP: 100/70 108/68 101/70 (!) 96/30  Pulse: 95 86 95 (!) 40  Resp:  20 19   Temp:  98.3 F (36.8 C) 98.3 F (36.8 C)   TempSrc:  Oral    SpO2: 100% 98% 97% 100%  Weight:  76.7 kg    Height:        Intake/Output Summary (Last 24 hours) at 08/07/2018 1141 Last data filed at 08/07/2018 0939 Gross per 24 hour  Intake 250 ml  Output 0 ml  Net 250 ml   Filed Weights   08/10/2018 0804 08/01/2018 1346 08/07/18 0334  Weight: 77.6 kg 75.2 kg 76.7 kg    Telemetry    Sinus bradycardia, earlier Atrial flutter - Personally Reviewed  ECG    sinus bradycardia with RAD, LPFB / IVCD and rate 42bpm - Personally Reviewed  Physical Exam   GEN: Somnolent Neck: No JVD  Cardiac: bradycardic but regular, no murmurs, rubs, or gallops.  Respiratory: Clear to auscultation bilaterally. GI: Soft, nontender, non-distended  MS: No edema; No deformity. Neuro:  Nonfocal   Psych: Normal affect   Labs    Chemistry Recent Labs  Lab 08/11/2018 0710 08/06/18 0045 08/06/18 0303  NA 141 135 136  K 4.4 4.8 5.0  CL 105 100 101  CO2 23 19* 19*  GLUCOSE 78 88 80  BUN 45* 55* 57*  CREATININE 5.27* 6.14* 6.31*  CALCIUM 8.3* 8.2* 8.4*  PROT 7.4  --   --   ALBUMIN 3.4*  --   --   AST 26  --   --   ALT 21  --   --   ALKPHOS 81  --   --   BILITOT 1.0  --   --   GFRNONAA 11* 9* 9*  GFRAA 12* 10* 10*  ANIONGAP 13 16* 16*     Hematology Recent Labs  Lab 08/01/2018 0710 08/06/18 0045  WBC 7.3 5.9  RBC 3.60* 3.74*  HGB 10.1* 10.4*  HCT 31.6* 32.9*  MCV 87.8 88.0  MCH 28.1 27.8  MCHC 32.0 31.6  RDW 19.1* 19.5*  PLT 150 114*    Cardiac Enzymes Recent Labs  Lab 07/24/2018 1418 07/24/2018 1833 08/06/18 0045 08/06/18 0303  TROPONINI 0.11* 0.11* 0.18* 0.22*   No results for input(s): TROPIPOC in the last 168 hours.   BNP Recent Labs  Lab 08/07/2018 0710  BNP  2,373.0*     DDimer No results for input(s): DDIMER in the last 168 hours.   Radiology    No results found.  Cardiac Studies   TTE 08/06/2018  1. The left ventricle has normal systolic function, with an ejection fraction of 55%. The cavity size was normal. There is mildly increased left ventricular wall thickness.  2. The right ventricle has moderately reduced systolic function. The cavity was moderately enlarged. There is no increase in right ventricular wall thickness. Right ventricular systolic pressure is mild to moderately elevated with an estimated pressure  of 43.6 mmHg.  3. Left atrial size was moderately dilated.  4. Right atrial size was moderately dilated.  5. Mitral valve regurgitation is moderate The MR jet is eccentric posteriorly directed.Unable to exclude mild prolapse of the anterior leaflet.  6. Tricuspid valve regurgitation is severe.  7. Pulmonic valve regurgitation is mild to moderate  8. Rhythm is atrial flutter  Patient Profile     63 y.o. male with a history of  ischemic and nonischemic cardiomyopathy, CAD, chronic combined CHF, end-stage renal disease on hemodialysis, frequent PVCs/bigeminy, hepatitis C, GI bleed, anemia of chronic disease, pulmonary hypertension, hypertensive heart disease, PAD and presenting with chest pain, cough, and hypertension.  Assessment & Plan    Stable angina with h/o CAD - No current chest pain. Known CAD as in original consult note. Likely initial CP triggered by cocaine use, tachycardia, and infection.  - Troponin elevation in the setting of new onset atrial flutter with RVR, cocaine abuse, sepsis, ESRD, known CAD, and anemia of chronic disease. Consider supply demand ischemia given comorbid conditions.  --Most recent EKG sinus bradycardia with RAD, LPFB /IVCD and rate 42bpm. Previously, atrial flutter with RVR. -No heparin given h/o recent acute GI bleed and no plan for cardiac catheterization at this time. Caution with ASA given h/o GIB.  Continue to hold Coreg and other rate controlling medications and antihypertensives given bradycardia and hypotension. Continue PTA statin as tolerated.  -Not a candidate for invasive ischemic evaluation given GI bleed. Aggressive risk factor modification recommended, including cessation of cocaine use.  New Paroxysmal Atrial flutter with RVR --No longer in atrial flutter with most recent EKG as above with sinus bradycardia, IVCD, RAD/LPFV. Previously, atrial flutter with RVR and reportedly asx while in flutter. --Continue holding rate controlling medications / anti-hypertensive medications at this time given bradycardia and hypotension. Updated echo as above with EF 55%. -Not a good candidate for anticoagulation given history of GI bleed. No plans for cardioversion at this time given no longer in atrial flutter. --Continue to monitor telemetry.  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 --Volume management per nephro/HD. --No PTA evidence based heart failure therapy  with h/o noncompliance  ESRD --Per nephro. Continue HD.  H/o GIB --Daily CBC  Sepsis --Per IM    For questions or updates, please contact [facility] Please consult [email or web address] for contact info under        Signed, Arvil Chaco, PA-C  08/07/2018, 11:41 AM

## 2018-08-07 NOTE — Progress Notes (Signed)
eLink Physician-Brief Progress Note Patient Name: [name] DOB: March 10, 1955 MRN: [id number]   Date of Service  08/07/2018  HPI/Events of Note  63 y/o M initially presented on 6/13 for chest pain transferred from stepdown due to hypoglycemia requiring D10, relatively hypotensive and bradycardic with some improvement with correction of hypoglycemia.  Also with fluid overload.  eICU Interventions   Cortisol 27, TSH 2.4  Agree to ncontinue empiric antibiotics  Planned dialysis tonight for volume overload. Continued on D 10     Intervention Category Major Interventions: Arrhythmia - evaluation and management;Other: Intermediate Interventions: Hypotension - evaluation and management;Hypervolemia - evaluation and management Evaluation Type: New Patient Evaluation  Judd Lien 08/07/2018, 10:30 PM

## 2018-08-07 NOTE — Progress Notes (Signed)
RN notifies rounding MD, cardiology, and nephrology to update on patient condition.  Pt remains lethargic and sleepy. Last CBG shows 80, I will recheck prior to end of my shift. Pts HR is sustaining in the uppers 30s and last BP was 97/62. Rounding MD orders ABG. No other new orders at this time. I will continue to assess.

## 2018-08-07 NOTE — Progress Notes (Signed)
Established hemodialysis patient known at [facility] MWF 5:45.

## 2018-08-08 ENCOUNTER — Inpatient Hospital Stay: Payer: Medicare Other

## 2018-08-08 DIAGNOSIS — I248 Other forms of acute ischemic heart disease: Secondary | ICD-10-CM

## 2018-08-08 DIAGNOSIS — R579 Shock, unspecified: Secondary | ICD-10-CM

## 2018-08-08 DIAGNOSIS — I4891 Unspecified atrial fibrillation: Secondary | ICD-10-CM

## 2018-08-08 LAB — BLOOD GAS, ARTERIAL
Acid-base deficit: 13.4 mmol/L — ABNORMAL HIGH (ref 0.0–2.0)
Bicarbonate: 13.1 mmol/L — ABNORMAL LOW (ref 20.0–28.0)
FIO2: 0.28
O2 Saturation: 93.1 %
Patient temperature: 37
pCO2 arterial: 32 mmHg (ref 32.0–48.0)
pH, Arterial: 7.22 — ABNORMAL LOW (ref 7.350–7.450)
pO2, Arterial: 81 mmHg — ABNORMAL LOW (ref 83.0–108.0)

## 2018-08-08 LAB — MAGNESIUM
Magnesium: 2.1 mg/dL (ref 1.7–2.4)
Magnesium: 2.1 mg/dL (ref 1.7–2.4)
Magnesium: 2.2 mg/dL (ref 1.7–2.4)

## 2018-08-08 LAB — CBC WITH DIFFERENTIAL/PLATELET
Abs Immature Granulocytes: 0.07 10*3/uL (ref 0.00–0.07)
Basophils Absolute: 0 10*3/uL (ref 0.0–0.1)
Basophils Relative: 0 %
Eosinophils Absolute: 0 10*3/uL (ref 0.0–0.5)
Eosinophils Relative: 0 %
HCT: 37.6 % — ABNORMAL LOW (ref 39.0–52.0)
Hemoglobin: 11.5 g/dL — ABNORMAL LOW (ref 13.0–17.0)
Immature Granulocytes: 1 %
Lymphocytes Relative: 6 %
Lymphs Abs: 0.6 10*3/uL — ABNORMAL LOW (ref 0.7–4.0)
MCH: 27.4 pg (ref 26.0–34.0)
MCHC: 30.6 g/dL (ref 30.0–36.0)
MCV: 89.7 fL (ref 80.0–100.0)
Monocytes Absolute: 1.3 10*3/uL — ABNORMAL HIGH (ref 0.1–1.0)
Monocytes Relative: 13 %
Neutro Abs: 7.8 10*3/uL — ABNORMAL HIGH (ref 1.7–7.7)
Neutrophils Relative %: 80 %
Platelets: 108 10*3/uL — ABNORMAL LOW (ref 150–400)
RBC: 4.19 MIL/uL — ABNORMAL LOW (ref 4.22–5.81)
RDW: 19.7 % — ABNORMAL HIGH (ref 11.5–15.5)
WBC: 9.9 10*3/uL (ref 4.0–10.5)
nRBC: 1.6 % — ABNORMAL HIGH (ref 0.0–0.2)

## 2018-08-08 LAB — RENAL FUNCTION PANEL
Albumin: 3.7 g/dL (ref 3.5–5.0)
Albumin: 3.5 g/dL (ref 3.5–5.0)
Albumin: 3.6 g/dL (ref 3.5–5.0)
Anion gap: 22 — ABNORMAL HIGH (ref 5–15)
Anion gap: 18 — ABNORMAL HIGH (ref 5–15)
Anion gap: 24 — ABNORMAL HIGH (ref 5–15)
BUN: 89 mg/dL — ABNORMAL HIGH (ref 8–23)
BUN: 79 mg/dL — ABNORMAL HIGH (ref 8–23)
BUN: 82 mg/dL — ABNORMAL HIGH (ref 8–23)
CO2: 12 mmol/L — ABNORMAL LOW (ref 22–32)
CO2: 16 mmol/L — ABNORMAL LOW (ref 22–32)
CO2: 14 mmol/L — ABNORMAL LOW (ref 22–32)
Calcium: 8.4 mg/dL — ABNORMAL LOW (ref 8.9–10.3)
Calcium: 8.7 mg/dL — ABNORMAL LOW (ref 8.9–10.3)
Calcium: 8.9 mg/dL (ref 8.9–10.3)
Chloride: 97 mmol/L — ABNORMAL LOW (ref 98–111)
Chloride: 97 mmol/L — ABNORMAL LOW (ref 98–111)
Chloride: 95 mmol/L — ABNORMAL LOW (ref 98–111)
Creatinine, Ser: 9.05 mg/dL — ABNORMAL HIGH (ref 0.61–1.24)
Creatinine, Ser: 7.41 mg/dL — ABNORMAL HIGH (ref 0.61–1.24)
Creatinine, Ser: 7.79 mg/dL — ABNORMAL HIGH (ref 0.61–1.24)
GFR calc Af Amer: 6 mL/min — ABNORMAL LOW (ref >60)
GFR calc Af Amer: 8 mL/min — ABNORMAL LOW (ref >60)
GFR calc Af Amer: 8 mL/min — ABNORMAL LOW (ref >60)
GFR calc non Af Amer: 6 mL/min — ABNORMAL LOW (ref >60)
GFR calc non Af Amer: 7 mL/min — ABNORMAL LOW (ref >60)
GFR calc non Af Amer: 7 mL/min — ABNORMAL LOW (ref >60)
Glucose, Bld: 81 mg/dL (ref 70–99)
Glucose, Bld: 130 mg/dL — ABNORMAL HIGH (ref 70–99)
Glucose, Bld: 115 mg/dL — ABNORMAL HIGH (ref 70–99)
Phosphorus: 14.2 mg/dL — ABNORMAL HIGH (ref 2.5–4.6)
Phosphorus: 11.4 mg/dL — ABNORMAL HIGH (ref 2.5–4.6)
Phosphorus: 11.8 mg/dL — ABNORMAL HIGH (ref 2.5–4.6)
Potassium: 7.5 mmol/L (ref 3.5–5.1)
Potassium: 6.6 mmol/L (ref 3.5–5.1)
Potassium: 6.8 mmol/L (ref 3.5–5.1)
Sodium: 131 mmol/L — ABNORMAL LOW (ref 135–145)
Sodium: 131 mmol/L — ABNORMAL LOW (ref 135–145)
Sodium: 133 mmol/L — ABNORMAL LOW (ref 135–145)

## 2018-08-08 LAB — GLUCOSE, CAPILLARY
Glucose-Capillary: 102 mg/dL — ABNORMAL HIGH (ref 70–99)
Glucose-Capillary: 107 mg/dL — ABNORMAL HIGH (ref 70–99)
Glucose-Capillary: 108 mg/dL — ABNORMAL HIGH (ref 70–99)
Glucose-Capillary: 39 mg/dL — CL (ref 70–99)
Glucose-Capillary: 46 mg/dL — ABNORMAL LOW (ref 70–99)
Glucose-Capillary: 68 mg/dL — ABNORMAL LOW (ref 70–99)
Glucose-Capillary: 79 mg/dL (ref 70–99)
Glucose-Capillary: 79 mg/dL (ref 70–99)
Glucose-Capillary: 91 mg/dL (ref 70–99)
Glucose-Capillary: 94 mg/dL (ref 70–99)
Glucose-Capillary: <10 mg/dL — CL (ref 70–99)

## 2018-08-08 LAB — BASIC METABOLIC PANEL
Anion gap: 22 — ABNORMAL HIGH (ref 5–15)
BUN: 87 mg/dL — ABNORMAL HIGH (ref 8–23)
CO2: 13 mmol/L — ABNORMAL LOW (ref 22–32)
Calcium: 8.4 mg/dL — ABNORMAL LOW (ref 8.9–10.3)
Chloride: 96 mmol/L — ABNORMAL LOW (ref 98–111)
Creatinine, Ser: 8.88 mg/dL — ABNORMAL HIGH (ref 0.61–1.24)
GFR calc Af Amer: 7 mL/min — ABNORMAL LOW (ref >60)
GFR calc non Af Amer: 6 mL/min — ABNORMAL LOW (ref >60)
Glucose, Bld: 101 mg/dL — ABNORMAL HIGH (ref 70–99)
Potassium: 7.3 mmol/L (ref 3.5–5.1)
Sodium: 131 mmol/L — ABNORMAL LOW (ref 135–145)

## 2018-08-08 LAB — INSULIN AND C-PEPTIDE, SERUM
C-Peptide: 18.4 ng/mL — ABNORMAL HIGH (ref 1.1–4.4)
Insulin: 29.4 u[IU]/mL — ABNORMAL HIGH (ref 2.6–24.9)

## 2018-08-08 LAB — TROPONIN I
Troponin I: 1.92 ng/mL (ref <0.03)
Troponin I: 1.73 ng/mL (ref <0.03)

## 2018-08-08 LAB — LACTIC ACID, PLASMA: Lactic Acid, Venous: 6.6 mmol/L (ref 0.5–1.9)

## 2018-08-08 MED ORDER — NOREPINEPHRINE BITARTRATE 1 MG/ML IV SOLN
0.0000 ug/min | INTRAVENOUS | Status: DC
  Administered 2018-08-08: 40 ug/min via INTRAVENOUS
  Administered 2018-08-09: 2 ug/min via INTRAVENOUS
  Filled 2018-08-08 (×3): qty 16

## 2018-08-08 MED ORDER — PUREFLOW DIALYSIS SOLUTION
INTRAVENOUS | Status: DC
  Administered 2018-08-08 (×2): via INTRAVENOUS_CENTRAL
  Administered 2018-08-09 (×2): 2000 mL via INTRAVENOUS_CENTRAL
  Administered 2018-08-09 – 2018-08-10 (×2): via INTRAVENOUS_CENTRAL

## 2018-08-08 MED ORDER — PANTOPRAZOLE SODIUM 40 MG IV SOLR
40.0000 mg | Freq: Two times a day (BID) | INTRAVENOUS | Status: DC
  Administered 2018-08-08 – 2018-08-10 (×6): 40 mg via INTRAVENOUS
  Filled 2018-08-08 (×6): qty 40

## 2018-08-08 MED ORDER — ALBUTEROL SULFATE (2.5 MG/3ML) 0.083% IN NEBU
10.0000 mg | INHALATION_SOLUTION | Freq: Once | RESPIRATORY_TRACT | Status: AC
  Administered 2018-08-08: 10 mg via RESPIRATORY_TRACT
  Filled 2018-08-08: qty 12

## 2018-08-08 MED ORDER — DEXTROSE 50 % IV SOLN
1.0000 | Freq: Once | INTRAVENOUS | Status: AC
  Administered 2018-08-08: 50 mL via INTRAVENOUS

## 2018-08-08 MED ORDER — SODIUM BICARBONATE 8.4 % IV SOLN
INTRAVENOUS | Status: AC
  Administered 2018-08-08: 100 meq via INTRAVENOUS
  Filled 2018-08-08: qty 100

## 2018-08-08 MED ORDER — INSULIN ASPART 100 UNIT/ML IV SOLN
10.0000 [IU] | Freq: Once | INTRAVENOUS | Status: AC
  Administered 2018-08-08: 10 [IU] via INTRAVENOUS
  Filled 2018-08-08: qty 0.1

## 2018-08-08 MED ORDER — CALCIUM GLUCONATE-NACL 1-0.675 GM/50ML-% IV SOLN
1.0000 g | Freq: Once | INTRAVENOUS | Status: AC
  Administered 2018-08-09: 1000 mg via INTRAVENOUS
  Filled 2018-08-08: qty 50

## 2018-08-08 MED ORDER — DEXTROSE 50 % IV SOLN
1.0000 | Freq: Once | INTRAVENOUS | Status: AC
  Administered 2018-08-09: 50 mL via INTRAVENOUS
  Filled 2018-08-08: qty 50

## 2018-08-08 MED ORDER — DEXTROSE 50 % IV SOLN
INTRAVENOUS | Status: AC
  Administered 2018-08-08: 50 mL
  Filled 2018-08-08: qty 50

## 2018-08-08 MED ORDER — HEPARIN SODIUM (PORCINE) 1000 UNIT/ML DIALYSIS
1000.0000 [IU] | INTRAMUSCULAR | Status: DC | PRN
  Administered 2018-08-10: 4000 [IU] via INTRAVENOUS_CENTRAL
  Filled 2018-08-08: qty 6
  Filled 2018-08-08: qty 4
  Filled 2018-08-08: qty 6

## 2018-08-08 MED ORDER — MORPHINE SULFATE (PF) 2 MG/ML IV SOLN
1.0000 mg | Freq: Once | INTRAVENOUS | Status: AC
  Administered 2018-08-08: 1 mg via INTRAVENOUS

## 2018-08-08 MED ORDER — SODIUM CHLORIDE 0.9 % IV SOLN
0.0000 ug/min | INTRAVENOUS | Status: DC
  Filled 2018-08-08: qty 4

## 2018-08-08 MED ORDER — SODIUM CHLORIDE 0.9 % IV SOLN
1.0000 g | Freq: Once | INTRAVENOUS | Status: DC
  Filled 2018-08-08: qty 10

## 2018-08-08 MED ORDER — NOREPINEPHRINE 16 MG/250ML-% IV SOLN: 0.0000 ug/min | INTRAVENOUS | Status: DC

## 2018-08-08 MED ORDER — HYDROCORTISONE NA SUCCINATE PF 100 MG IJ SOLR
50.0000 mg | Freq: Four times a day (QID) | INTRAMUSCULAR | Status: DC
  Administered 2018-08-08 – 2018-08-11 (×13): 50 mg via INTRAVENOUS
  Filled 2018-08-08 (×14): qty 2

## 2018-08-08 MED ORDER — DEXTROSE 50 % IV SOLN
1.0000 | Freq: Once | INTRAVENOUS | Status: AC
  Administered 2018-08-08: 13:00:00 50 mL via INTRAVENOUS

## 2018-08-08 MED ORDER — SODIUM BICARBONATE 8.4 % IV SOLN
100.0000 meq | Freq: Once | INTRAVENOUS | Status: AC
  Administered 2018-08-08: 100 meq via INTRAVENOUS

## 2018-08-08 MED ORDER — HEPARIN BOLUS VIA INFUSION
4000.0000 [IU] | Freq: Once | INTRAVENOUS | Status: DC
  Filled 2018-08-08: qty 4000

## 2018-08-08 MED ORDER — HYDROXYZINE HCL 25 MG PO TABS
25.0000 mg | ORAL_TABLET | Freq: Three times a day (TID) | ORAL | Status: DC | PRN
  Administered 2018-08-08 – 2018-08-09 (×2): 25 mg via ORAL
  Filled 2018-08-08 (×3): qty 1

## 2018-08-08 MED ORDER — DEXTROSE 50 % IV SOLN
1.0000 | Freq: Once | INTRAVENOUS | Status: AC
  Administered 2018-08-08: 12:00:00 50 mL via INTRAVENOUS
  Filled 2018-08-08: qty 50

## 2018-08-08 MED ORDER — HEPARIN (PORCINE) 25000 UT/250ML-% IV SOLN
1000.0000 [IU]/h | INTRAVENOUS | Status: DC
  Filled 2018-08-08: qty 250

## 2018-08-08 MED ORDER — INSULIN REGULAR HUMAN 100 UNIT/ML IJ SOLN
10.0000 [IU] | Freq: Once | INTRAMUSCULAR | Status: AC
  Administered 2018-08-09: 10 [IU] via INTRAVENOUS
  Filled 2018-08-08: qty 10

## 2018-08-08 MED ORDER — VASOPRESSIN 20 UNIT/ML IV SOLN
0.0300 [IU]/min | INTRAVENOUS | Status: DC
  Administered 2018-08-08: 0.03 [IU]/min via INTRAVENOUS
  Filled 2018-08-08 (×3): qty 2

## 2018-08-08 MED ORDER — SODIUM CHLORIDE 0.9 % IV SOLN
2.0000 g | Freq: Two times a day (BID) | INTRAVENOUS | Status: DC
  Administered 2018-08-08 – 2018-08-12 (×8): 2 g via INTRAVENOUS
  Filled 2018-08-08 (×10): qty 2

## 2018-08-08 MED ORDER — CALCIUM GLUCONATE-NACL 1-0.675 GM/50ML-% IV SOLN
1.0000 g | Freq: Once | INTRAVENOUS | Status: AC
  Administered 2018-08-08: 1000 mg via INTRAVENOUS
  Filled 2018-08-08: qty 50

## 2018-08-08 NOTE — Progress Notes (Signed)
[facility]  ROUNDING NOTE   Subjective:   Moved to ICU. Hypoglycemia and placed on D10W infusion.   Held dialysis yesterday due to hemodynamic instability. Started on norepinephrine gtt.   Elevated cardiac enzymes, cardiology recommend heparin gtt however with recent GI bleed, this was held.   Given benadryl this morning for itching  Objective:  Vital signs in last 24 hours:  Temp:  [97 F (36.1 C)-97.9 F (36.6 C)] 97.7 F (36.5 C) (06/16 0200) Pulse Rate:  [33-75] 68 (06/16 0500) Resp:  [11-22] 16 (06/16 0500) BP: (96-115)/(30-76) 104/76 (06/16 0500) SpO2:  [97 %-100 %] 100 % (06/16 0500) Weight:  [81.8 kg] 81.8 kg (06/16 0439)  Weight change: 5.097 kg Filed Weights   07/26/2018 1346 08/07/18 0334 08/08/18 0439  Weight: 75.2 kg 76.7 kg 81.8 kg    Intake/Output: I/O last 3 completed shifts: In: 1126.6 [P.O.:120; I.V.:1006.6] Out: 0    Intake/Output this shift:  Total I/O In: 240 [P.O.:240] Out: -   Physical Exam: General: Critically ill  Head: Normocephalic, atraumatic. Dry oral mucosal membranes  Eyes: Anicteric  Neck: Supple, trachea midline  Lungs:  Clear to auscultation, normal effort  Heart: Bradycarda, irregular  Abdomen:  Soft, nontender, bowel sounds present  Extremities: Trace peripheral edema.  Neurologic: Awake, alert, following commands  Skin: No lesions  Access: LUE AVF +bruit and thrill    Basic Metabolic Panel: Recent Labs  Lab 07/24/2018 0710 08/06/18 0045 08/06/18 0303  NA 141 135 136  K 4.4 4.8 5.0  CL 105 100 101  CO2 23 19* 19*  GLUCOSE 78 88 80  BUN 45* 55* 57*  CREATININE 5.27* 6.14* 6.31*  CALCIUM 8.3* 8.2* 8.4*  MG  --  1.7 1.7  PHOS  --  9.9* 10.1*    Liver Function Tests: Recent Labs  Lab 08/18/2018 0710 08/07/18 1513  AST 26 51*  ALT 21 27  ALKPHOS 81 87  BILITOT 1.0 1.0  PROT 7.4 7.9  ALBUMIN 3.4* 3.4*   Recent Labs  Lab 08/07/18 1513  LIPASE 45   No results for input(s): AMMONIA in the  last 168 hours.  CBC: Recent Labs  Lab 08/02/2018 0710 08/06/18 0045 08/07/18 2041  WBC 7.3 5.9  --   NEUTROABS 4.5  --   --   HGB 10.1* 10.4* 11.7*  HCT 31.6* 32.9*  --   MCV 87.8 88.0  --   PLT 150 114*  --     Cardiac Enzymes: Recent Labs  Lab 08/12/2018 1833 08/06/18 0045 08/06/18 0303 08/07/18 1855 08/08/18 0115  TROPONINI 0.11* 0.18* 0.22* 2.05* 1.92*    BNP: Invalid input(s): POCBNP  CBG: Recent Labs  Lab 08/08/18 0502 08/08/18 0600 08/08/18 0636 08/08/18 0702 08/08/18 0747  GLUCAP 107* 79 52 46* 108*    Microbiology: Results for orders placed or performed during the hospital encounter of 08/12/2018  Blood culture (routine x 2)     Status: None (Preliminary result)   Collection Time: 08/09/2018  7:46 AM   Specimen: BLOOD  Result Value Ref Range Status   Specimen Description BLOOD RFA  Final   Special Requests   Final    BOTTLES DRAWN AEROBIC AND ANAEROBIC Blood Culture results may not be optimal due to an excessive volume of blood received in culture bottles   Culture   Final    NO GROWTH 3 DAYS Performed at [facility], [address], [place], [place] [address]    Report Status PENDING  Incomplete  Blood culture (routine x 2)     Status: None (Preliminary result)   Collection Time: 07/29/2018  7:47 AM   Specimen: BLOOD  Result Value Ref Range Status   Specimen Description BLOOD R AC  Final   Special Requests   Final    BOTTLES DRAWN AEROBIC AND ANAEROBIC Blood Culture results may not be optimal due to an excessive volume of blood received in culture bottles   Culture   Final    NO GROWTH 3 DAYS Performed at [facility], [address], [place], [place] [address]    Report Status PENDING  Incomplete  SARS Coronavirus 2 (CEPHEID- Performed in [place] hospital lab), Hosp Order     Status: None   Collection Time: 08/17/2018  8:34 AM   Specimen: Nasopharyngeal Swab  Result Value Ref Range Status   SARS Coronavirus 2 NEGATIVE  NEGATIVE Final    Comment: (NOTE) If result is NEGATIVE SARS-CoV-2 target nucleic acids are NOT DETECTED. The SARS-CoV-2 RNA is generally detectable in upper and lower  respiratory specimens during the acute phase of infection. The lowest  concentration of SARS-CoV-2 viral copies this assay can detect is 250  copies / mL. A negative result does not preclude SARS-CoV-2 infection  and should not be used as the sole basis for treatment or other  patient management decisions.  A negative result may occur with  improper specimen collection / handling, submission of specimen other  than nasopharyngeal swab, presence of viral mutation(s) within the  areas targeted by this assay, and inadequate number of viral copies  (<250 copies / mL). A negative result must be combined with clinical  observations, patient history, and epidemiological information. If result is POSITIVE SARS-CoV-2 target nucleic acids are DETECTED. The SARS-CoV-2 RNA is generally detectable in upper and lower  respiratory specimens dur ing the acute phase of infection.  Positive  results are indicative of active infection with SARS-CoV-2.  Clinical  correlation with patient history and other diagnostic information is  necessary to determine patient infection status.  Positive results do  not rule out bacterial infection or co-infection with other viruses. If result is PRESUMPTIVE POSTIVE SARS-CoV-2 nucleic acids MAY BE PRESENT.   A presumptive positive result was obtained on the submitted specimen  and confirmed on repeat testing.  While 2019 novel coronavirus  (SARS-CoV-2) nucleic acids may be present in the submitted sample  additional confirmatory testing may be necessary for epidemiological  and / or clinical management purposes  to differentiate between  SARS-CoV-2 and other Sarbecovirus currently known to infect humans.  If clinically indicated additional testing with an alternate test  methodology [phone]) is  advised. The SARS-CoV-2 RNA is generally  detectable in upper and lower respiratory sp ecimens during the acute  phase of infection. The expected result is Negative. Fact Sheet for Patients:  StrictlyIdeas.no Fact Sheet for Healthcare Providers: BankingDealers.co.za This test is not yet approved or cleared by the [place] FDA and has been authorized for detection and/or diagnosis of SARS-CoV-2 by FDA under an Emergency Use Authorization (EUA).  This EUA will remain in effect (meaning this test can be used) for the duration of the COVID-19 declaration under Section 564(b)(1) of the Act, 21 U.S.C. section 360bbb-3(b)(1), unless the authorization is terminated or revoked sooner. Performed at [facility], [address], [place],  [address]   MRSA PCR Screening     Status: None   Collection Time: 08/10/2018  1:50 PM   Specimen: Nasal Mucosa;  Nasopharyngeal  Result Value Ref Range Status   MRSA by PCR NEGATIVE NEGATIVE Final    Comment:        The GeneXpert MRSA Assay (FDA approved for NASAL specimens only), is one component of a comprehensive MRSA colonization surveillance program. It is not intended to diagnose MRSA infection nor to guide or monitor treatment for MRSA infections. Performed at [facility], [address] [place], [place] [address]     Coagulation Studies: No results for input(s): LABPROT, INR in the last 72 hours.  Urinalysis: No results for input(s): COLORURINE, LABSPEC, PHURINE, GLUCOSEU, HGBUR, BILIRUBINUR, KETONESUR, PROTEINUR, UROBILINOGEN, NITRITE, LEUKOCYTESUR in the last 72 hours.  Invalid input(s): APPERANCEUR    Imaging: Ct Head Wo Contrast  Result Date: 08/07/2018 CLINICAL DATA:  Altered mental status EXAM: CT HEAD WITHOUT CONTRAST TECHNIQUE: Contiguous axial images were obtained from the base of the skull through the vertex without intravenous contrast. COMPARISON:   05/04/2018 FINDINGS: Brain: No evidence of acute infarction, hemorrhage, hydrocephalus, extra-axial collection or mass lesion/mass effect. Vascular: No hyperdense vessel or unexpected calcification. Skull: Normal. Negative for fracture or focal lesion. Sinuses/Orbits: Small, partially imaged air-fluid level of the left maxillary sinus. Other: Contrast opacification of the vascular structures and meninges, likely due to persistent circulating contrast in the setting of renal failure and hemodialysis. IMPRESSION: 1.  No acute intracranial pathology. 2. Small, partially imaged air-fluid level of the left maxillary sinus. Correlate for evidence of sinusitis. 3. Contrast opacification of the vascular structures and meninges, likely due to persistent circulating contrast in the setting of renal failure and hemodialysis. Electronically Signed   By: Eddie Candle M.D.   On: 08/07/2018 16:31   Ct Chest W Contrast  Result Date: 08/07/2018 CLINICAL DATA:  Chest pain and shortness of breath, abdominal pain and distension today. EXAM: CT CHEST, ABDOMEN, AND PELVIS WITH CONTRAST TECHNIQUE: Multidetector CT imaging of the chest, abdomen and pelvis was performed following the standard protocol during bolus administration of intravenous contrast. CONTRAST:  122mL OMNIPAQUE IOHEXOL 300 MG/ML  SOLN COMPARISON:  Chest CT 07/26/2018 and abdominal CT scan 05/04/2018 FINDINGS: CT CHEST FINDINGS Cardiovascular: The heart is enlarged but stable. No pericardial effusion. Stable tortuosity, ectasia and heavy calcification of the thoracic aorta. Stable three-vessel coronary artery calcifications. Stable left brachiocephalic vein stent. Enlarged pulmonary arteries consistent with pulmonary hypertension. Mediastinum/Nodes: Stable scattered mediastinal and hilar lymph nodes but no mass or overt adenopathy. The esophagus is grossly normal. Lungs/Pleura: Small to moderate-sized right effusion and small diffusion both larger when compared to  prior recent chest CT. There is also overlying atelectasis. No overt pulmonary edema, focal pulmonary infiltrates or worrisome pulmonary lesions. Musculoskeletal: No chest wall masses identified. There is diffuse body wall edema noted suggesting anasarca. No supraclavicular or axillary lymphadenopathy the. The bony structures are unremarkable and appears stable. CT ABDOMEN PELVIS FINDINGS Hepatobiliary: Moderate reflux of contrast down the IVC and into the hepatic veins suggesting right heart failure or tricuspid regurgitation. Liver demonstrates changes suggesting hepatic congestion. No worrisome hepatic lesions or intrahepatic biliary dilatation. Contrast noted in the gallbladder related to the recent chest CT with contrast. Pancreas: No mass, inflammation or ductal dilatation. Spleen: Normal size.  No focal lesions. Adrenals/Urinary Tract: The adrenal glands are unremarkable and stable. Small kidneys with extensive arterial calcifications. No worrisome renal lesions. The bladder is grossly normal. Stomach/Bowel: The stomach, duodenum, small bowel and colon are grossly normal. No obvious acute inflammatory process, mass lesion or obstructive findings. Vascular/Lymphatic: Heavy atherosclerotic calcifications involving the  aorta and branch vessels, particularly the renal arteries. Stable scattered mesenteric and retroperitoneal lymph nodes but no mass or overt adenopathy. Reproductive: The prostate gland and seminal vesicles are unremarkable. Other: Diffuse mesenteric edema and small volume abdominal/pelvic ascites. The pelvic fluid demonstrates higher attenuation which could suggest hemorrhage or peritonitis. No obvious active extravasation of contrast material. Coils are noted in the region of the gastroduodenal artery from previous embolization procedure. Musculoskeletal: Severe/aggressive degenerative changes involving the lumbar spine. Amyloid spondyloarthropathy would certainly be a consideration given the  patient's history of renal dialysis. IMPRESSION: 1. Enlarging bilateral pleural effusions since the prior CT scan from 2 days ago. Moderate on the right and small the left with overlying atelectasis. 2. Stable cardiac enlargement and vascular congestion but no overt pulmonary edema. 3. Reflux of contrast down the IVC consistent with right heart failure or tricuspid regurgitation. Changes of hepatic congestion are noted. 4. Diffuse body wall edema, mesenteric edema and abdominal/pelvic ascites. The ascites in the abdomen/pelvis is high in attenuation suggesting complex fluid or hemorrhage. No obvious extravasating contrast is identified. 5. Extensive vascular calcifications. 6. Severe and aggressive degenerative changes involving the lumbar spine. Amyloid spondyloarthropathy would be a consideration. Electronically Signed   By: Marijo Sanes M.D.   On: 08/07/2018 19:37   Ct Abdomen Pelvis W Contrast  Result Date: 08/07/2018 CLINICAL DATA:  Chest pain and shortness of breath, abdominal pain and distension today. EXAM: CT CHEST, ABDOMEN, AND PELVIS WITH CONTRAST TECHNIQUE: Multidetector CT imaging of the chest, abdomen and pelvis was performed following the standard protocol during bolus administration of intravenous contrast. CONTRAST:  144mL OMNIPAQUE IOHEXOL 300 MG/ML  SOLN COMPARISON:  Chest CT 08/17/2018 and abdominal CT scan 05/04/2018 FINDINGS: CT CHEST FINDINGS Cardiovascular: The heart is enlarged but stable. No pericardial effusion. Stable tortuosity, ectasia and heavy calcification of the thoracic aorta. Stable three-vessel coronary artery calcifications. Stable left brachiocephalic vein stent. Enlarged pulmonary arteries consistent with pulmonary hypertension. Mediastinum/Nodes: Stable scattered mediastinal and hilar lymph nodes but no mass or overt adenopathy. The esophagus is grossly normal. Lungs/Pleura: Small to moderate-sized right effusion and small diffusion both larger when compared to prior  recent chest CT. There is also overlying atelectasis. No overt pulmonary edema, focal pulmonary infiltrates or worrisome pulmonary lesions. Musculoskeletal: No chest wall masses identified. There is diffuse body wall edema noted suggesting anasarca. No supraclavicular or axillary lymphadenopathy the. The bony structures are unremarkable and appears stable. CT ABDOMEN PELVIS FINDINGS Hepatobiliary: Moderate reflux of contrast down the IVC and into the hepatic veins suggesting right heart failure or tricuspid regurgitation. Liver demonstrates changes suggesting hepatic congestion. No worrisome hepatic lesions or intrahepatic biliary dilatation. Contrast noted in the gallbladder related to the recent chest CT with contrast. Pancreas: No mass, inflammation or ductal dilatation. Spleen: Normal size.  No focal lesions. Adrenals/Urinary Tract: The adrenal glands are unremarkable and stable. Small kidneys with extensive arterial calcifications. No worrisome renal lesions. The bladder is grossly normal. Stomach/Bowel: The stomach, duodenum, small bowel and colon are grossly normal. No obvious acute inflammatory process, mass lesion or obstructive findings. Vascular/Lymphatic: Heavy atherosclerotic calcifications involving the aorta and branch vessels, particularly the renal arteries. Stable scattered mesenteric and retroperitoneal lymph nodes but no mass or overt adenopathy. Reproductive: The prostate gland and seminal vesicles are unremarkable. Other: Diffuse mesenteric edema and small volume abdominal/pelvic ascites. The pelvic fluid demonstrates higher attenuation which could suggest hemorrhage or peritonitis. No obvious active extravasation of contrast material. Coils are noted in the region of the gastroduodenal artery  from previous embolization procedure. Musculoskeletal: Severe/aggressive degenerative changes involving the lumbar spine. Amyloid spondyloarthropathy would certainly be a consideration given the patient's  history of renal dialysis. IMPRESSION: 1. Enlarging bilateral pleural effusions since the prior CT scan from 2 days ago. Moderate on the right and small the left with overlying atelectasis. 2. Stable cardiac enlargement and vascular congestion but no overt pulmonary edema. 3. Reflux of contrast down the IVC consistent with right heart failure or tricuspid regurgitation. Changes of hepatic congestion are noted. 4. Diffuse body wall edema, mesenteric edema and abdominal/pelvic ascites. The ascites in the abdomen/pelvis is high in attenuation suggesting complex fluid or hemorrhage. No obvious extravasating contrast is identified. 5. Extensive vascular calcifications. 6. Severe and aggressive degenerative changes involving the lumbar spine. Amyloid spondyloarthropathy would be a consideration. Electronically Signed   By: Marijo Sanes M.D.   On: 08/07/2018 19:37     Medications:   . sodium chloride    . ceFEPime (MAXIPIME) IV    . dextrose 75 mL/hr at 08/08/18 0918  . norepinephrine (LEVOPHED) Adult infusion 20 mcg/min (08/08/18 0837)   . aspirin EC  81 mg Oral Daily  . atorvastatin  40 mg Oral Daily  . benzonatate  100 mg Oral TID  . calcium acetate  2,001 mg Oral TID WC  . cinacalcet  30 mg Oral 3 times weekly  . colchicine  0.6 mg Oral Daily  . docusate sodium  100 mg Oral Daily  . heparin  4,000 Units Intravenous Once  . hydrocortisone sod succinate (SOLU-CORTEF) inj  50 mg Intravenous Q6H  . ketorolac  15 mg Intravenous Q8H  . mouth rinse  15 mL Mouth Rinse BID  . midodrine  10 mg Oral TID WC  . pantoprazole (PROTONIX) IV  40 mg Intravenous Q12H  . sodium chloride flush  3 mL Intravenous Q12H   sodium chloride, acetaminophen **OR** acetaminophen, diphenhydrAMINE, guaiFENesin, ipratropium-albuterol, morphine injection, nitroGLYCERIN, ondansetron **OR** ondansetron (ZOFRAN) IV, oxyCODONE-acetaminophen, senna-docusate, sodium chloride flush  Assessment/ Plan:  63 y.o. male end stage renal  disease on hemodialysis, hypertension, hepatitis C, anemia, seizure disorder, COPD/tobacco abuse,substance abuse and alcohol abuse,secondary hyperparathyroidism, chronic systolic CHF admitted with chest pain and atrial flutter. Transferred to ICU on 6/15 for worsening status.   [facility] MWF [address]Left arm AVG. 240 minutes/72kg  1.  ESRD on HD MWF.  Did not get hemodialysis treatment yesterday. Now requiring norepinephrine.  - Will ask ICU to place temp HD catheter - Start patient on continuous renal replacement therapy.   2.  Anemia of chronic kidney disease.  Hemoglobin 11.7 Holding EPO due to concerns of dissection/ischemia.   3.  Secondary hyperparathyroidism with hyperphophatemia. PTH elevated at 1178 on 07/31/2018 - home regimen of calcium acetate. Takes cinacalcet on dialysis days. Will hold until able to take PO  4.  Shock: cardiogenic with Chest pain/atrial flutter.  With demand ischemia. However also with elevated lactic acid level.  Appreciate cardiology input.   5. Diabetes mellitus type II with chronic kidney disease: with prolonged hypoglycemia - D10W infusion   LOS: 3 [name] 6/16/20209:33 AM

## 2018-08-08 NOTE — Progress Notes (Signed)
CRITICAL VALUE ALERT  Critical Value: potassium of 6.8  Date & Time Notied:  08/08/18 23:25  Provider Notified: Tana Conch, NP and Dr. Juleen China  Orders Received/Actions taken: give calcium gluconate 1g, 10 units of insulin and D50 once and maintain course with CRRT

## 2018-08-08 NOTE — Progress Notes (Signed)
Progress Note  Patient Name: [name] Date of Encounter: 08/08/2018  Primary Cardiologist: Nelva Bush, MD   Subjective   Patient transferred to stepdown overnight due to lethargy and worsening metabolic acidosis.  Patient feels tired and does not like the taste of his food.  He denies chest pain.  He feels like he still needs oxygen but denies frank dyspnea.  He feels like his ankles are swollen.  Patient now complains of itching all over.  Inpatient Medications    Scheduled Meds: . aspirin EC  81 mg Oral Daily  . atorvastatin  40 mg Oral Daily  . benzonatate  100 mg Oral TID  . calcium acetate  2,001 mg Oral TID WC  . cinacalcet  30 mg Oral 3 times weekly  . colchicine  0.6 mg Oral Daily  . docusate sodium  100 mg Oral Daily  . ketorolac  15 mg Intravenous Q8H  . mouth rinse  15 mL Mouth Rinse BID  . midodrine  10 mg Oral TID WC  . sodium chloride flush  3 mL Intravenous Q12H   Continuous Infusions: . sodium chloride    . ceFEPime (MAXIPIME) IV    . dextrose 75 mL/hr at 08/08/18 0432  . norepinephrine (LEVOPHED) Adult infusion 16 mcg/min (08/08/18 0627)   PRN Meds: sodium chloride, acetaminophen **OR** acetaminophen, diphenhydrAMINE, guaiFENesin, ipratropium-albuterol, morphine injection, nitroGLYCERIN, ondansetron **OR** ondansetron (ZOFRAN) IV, oxyCODONE-acetaminophen, senna-docusate, sodium chloride flush   Vital Signs    Vitals:   08/08/18 0300 08/08/18 0400 08/08/18 0439 08/08/18 0500  BP: (!) 109/57 115/73  104/76  Pulse: (!) 34 61  68  Resp: 17 11  16   Temp:      TempSrc:      SpO2: 99% 100%  100%  Weight:   81.8 kg   Height:        Intake/Output Summary (Last 24 hours) at 08/08/2018 0758 Last data filed at 08/08/2018 0700 Gross per 24 hour  Intake 1116.61 ml  Output 0 ml  Net 1116.61 ml   Last 3 Weights 08/08/2018 08/07/2018 08/17/2018  Weight (lbs) 180 lb 5.4 oz 169 lb 1.6 oz 165 lb 12.6 oz  Weight (kg) 81.8 kg 76.703 kg 75.2 kg       Telemetry    Sinus rhythm with Mobitz type 1 second degree AV block.  This AM, he developed wide-complex tachycardia most suggestive of a-fib with aberrancy and less likely slow VT - Personally Reviewed  ECG    Sinus bradycardia with 1st degree AV block and competing junction rhythm - Personally Reviewed  Physical Exam   GEN: Appears uncomfortable. Neck: No obvious JVD. Cardiac: Bradycardic and irregular.  No murmurs. Respiratory: Bilateral rhochi.  No wheezes or crackles. GI: Soft, nontender, non-distended  MS: No edema; No deformity. Neuro:  Nonfocal  Psych: Somewhat agitated.  Labs    Chemistry Recent Labs  Lab 07/28/2018 0710 08/06/18 0045 08/06/18 0303 08/07/18 1513  NA 141 135 136  --   K 4.4 4.8 5.0  --   CL 105 100 101  --   CO2 23 19* 19*  --   GLUCOSE 78 88 80  --   BUN 45* 55* 57*  --   CREATININE 5.27* 6.14* 6.31*  --   CALCIUM 8.3* 8.2* 8.4*  --   PROT 7.4  --   --  7.9  ALBUMIN 3.4*  --   --  3.4*  AST 26  --   --  51*  ALT 21  --   --  27  ALKPHOS 81  --   --  87  BILITOT 1.0  --   --  1.0  GFRNONAA 11* 9* 9*  --   GFRAA 12* 10* 10*  --   ANIONGAP 13 16* 16*  --      Hematology Recent Labs  Lab 08/14/2018 0710 08/06/18 0045 08/07/18 2041  WBC 7.3 5.9  --   RBC 3.60* 3.74*  --   HGB 10.1* 10.4* 11.7*  HCT 31.6* 32.9*  --   MCV 87.8 88.0  --   MCH 28.1 27.8  --   MCHC 32.0 31.6  --   RDW 19.1* 19.5*  --   PLT 150 114*  --     Cardiac Enzymes Recent Labs  Lab 08/06/18 0045 08/06/18 0303 08/07/18 1855 08/08/18 0115  TROPONINI 0.18* 0.22* 2.05* 1.92*   No results for input(s): TROPIPOC in the last 168 hours.   BNP Recent Labs  Lab 08/21/2018 0710  BNP 2,373.0*     DDimer No results for input(s): DDIMER in the last 168 hours.   Radiology    Ct Head Wo Contrast  Result Date: 08/07/2018 CLINICAL DATA:  Altered mental status EXAM: CT HEAD WITHOUT CONTRAST TECHNIQUE: Contiguous axial images were obtained from the base of the skull  through the vertex without intravenous contrast. COMPARISON:  05/04/2018 FINDINGS: Brain: No evidence of acute infarction, hemorrhage, hydrocephalus, extra-axial collection or mass lesion/mass effect. Vascular: No hyperdense vessel or unexpected calcification. Skull: Normal. Negative for fracture or focal lesion. Sinuses/Orbits: Small, partially imaged air-fluid level of the left maxillary sinus. Other: Contrast opacification of the vascular structures and meninges, likely due to persistent circulating contrast in the setting of renal failure and hemodialysis. IMPRESSION: 1.  No acute intracranial pathology. 2. Small, partially imaged air-fluid level of the left maxillary sinus. Correlate for evidence of sinusitis. 3. Contrast opacification of the vascular structures and meninges, likely due to persistent circulating contrast in the setting of renal failure and hemodialysis. Electronically Signed   By: Eddie Candle M.D.   On: 08/07/2018 16:31   Ct Chest W Contrast  Result Date: 08/07/2018 CLINICAL DATA:  Chest pain and shortness of breath, abdominal pain and distension today. EXAM: CT CHEST, ABDOMEN, AND PELVIS WITH CONTRAST TECHNIQUE: Multidetector CT imaging of the chest, abdomen and pelvis was performed following the standard protocol during bolus administration of intravenous contrast. CONTRAST:  167mL OMNIPAQUE IOHEXOL 300 MG/ML  SOLN COMPARISON:  Chest CT 08/19/2018 and abdominal CT scan 05/04/2018 FINDINGS: CT CHEST FINDINGS Cardiovascular: The heart is enlarged but stable. No pericardial effusion. Stable tortuosity, ectasia and heavy calcification of the thoracic aorta. Stable three-vessel coronary artery calcifications. Stable left brachiocephalic vein stent. Enlarged pulmonary arteries consistent with pulmonary hypertension. Mediastinum/Nodes: Stable scattered mediastinal and hilar lymph nodes but no mass or overt adenopathy. The esophagus is grossly normal. Lungs/Pleura: Small to moderate-sized right  effusion and small diffusion both larger when compared to prior recent chest CT. There is also overlying atelectasis. No overt pulmonary edema, focal pulmonary infiltrates or worrisome pulmonary lesions. Musculoskeletal: No chest wall masses identified. There is diffuse body wall edema noted suggesting anasarca. No supraclavicular or axillary lymphadenopathy the. The bony structures are unremarkable and appears stable. CT ABDOMEN PELVIS FINDINGS Hepatobiliary: Moderate reflux of contrast down the IVC and into the hepatic veins suggesting right heart failure or tricuspid regurgitation. Liver demonstrates changes suggesting hepatic congestion. No worrisome hepatic lesions or intrahepatic biliary dilatation. Contrast noted in the gallbladder related to the recent chest CT  with contrast. Pancreas: No mass, inflammation or ductal dilatation. Spleen: Normal size.  No focal lesions. Adrenals/Urinary Tract: The adrenal glands are unremarkable and stable. Small kidneys with extensive arterial calcifications. No worrisome renal lesions. The bladder is grossly normal. Stomach/Bowel: The stomach, duodenum, small bowel and colon are grossly normal. No obvious acute inflammatory process, mass lesion or obstructive findings. Vascular/Lymphatic: Heavy atherosclerotic calcifications involving the aorta and branch vessels, particularly the renal arteries. Stable scattered mesenteric and retroperitoneal lymph nodes but no mass or overt adenopathy. Reproductive: The prostate gland and seminal vesicles are unremarkable. Other: Diffuse mesenteric edema and small volume abdominal/pelvic ascites. The pelvic fluid demonstrates higher attenuation which could suggest hemorrhage or peritonitis. No obvious active extravasation of contrast material. Coils are noted in the region of the gastroduodenal artery from previous embolization procedure. Musculoskeletal: Severe/aggressive degenerative changes involving the lumbar spine. Amyloid  spondyloarthropathy would certainly be a consideration given the patient's history of renal dialysis. IMPRESSION: 1. Enlarging bilateral pleural effusions since the prior CT scan from 2 days ago. Moderate on the right and small the left with overlying atelectasis. 2. Stable cardiac enlargement and vascular congestion but no overt pulmonary edema. 3. Reflux of contrast down the IVC consistent with right heart failure or tricuspid regurgitation. Changes of hepatic congestion are noted. 4. Diffuse body wall edema, mesenteric edema and abdominal/pelvic ascites. The ascites in the abdomen/pelvis is high in attenuation suggesting complex fluid or hemorrhage. No obvious extravasating contrast is identified. 5. Extensive vascular calcifications. 6. Severe and aggressive degenerative changes involving the lumbar spine. Amyloid spondyloarthropathy would be a consideration. Electronically Signed   By: Marijo Sanes M.D.   On: 08/07/2018 19:37   Ct Abdomen Pelvis W Contrast  Result Date: 08/07/2018 CLINICAL DATA:  Chest pain and shortness of breath, abdominal pain and distension today. EXAM: CT CHEST, ABDOMEN, AND PELVIS WITH CONTRAST TECHNIQUE: Multidetector CT imaging of the chest, abdomen and pelvis was performed following the standard protocol during bolus administration of intravenous contrast. CONTRAST:  165mL OMNIPAQUE IOHEXOL 300 MG/ML  SOLN COMPARISON:  Chest CT 08/12/2018 and abdominal CT scan 05/04/2018 FINDINGS: CT CHEST FINDINGS Cardiovascular: The heart is enlarged but stable. No pericardial effusion. Stable tortuosity, ectasia and heavy calcification of the thoracic aorta. Stable three-vessel coronary artery calcifications. Stable left brachiocephalic vein stent. Enlarged pulmonary arteries consistent with pulmonary hypertension. Mediastinum/Nodes: Stable scattered mediastinal and hilar lymph nodes but no mass or overt adenopathy. The esophagus is grossly normal. Lungs/Pleura: Small to moderate-sized right  effusion and small diffusion both larger when compared to prior recent chest CT. There is also overlying atelectasis. No overt pulmonary edema, focal pulmonary infiltrates or worrisome pulmonary lesions. Musculoskeletal: No chest wall masses identified. There is diffuse body wall edema noted suggesting anasarca. No supraclavicular or axillary lymphadenopathy the. The bony structures are unremarkable and appears stable. CT ABDOMEN PELVIS FINDINGS Hepatobiliary: Moderate reflux of contrast down the IVC and into the hepatic veins suggesting right heart failure or tricuspid regurgitation. Liver demonstrates changes suggesting hepatic congestion. No worrisome hepatic lesions or intrahepatic biliary dilatation. Contrast noted in the gallbladder related to the recent chest CT with contrast. Pancreas: No mass, inflammation or ductal dilatation. Spleen: Normal size.  No focal lesions. Adrenals/Urinary Tract: The adrenal glands are unremarkable and stable. Small kidneys with extensive arterial calcifications. No worrisome renal lesions. The bladder is grossly normal. Stomach/Bowel: The stomach, duodenum, small bowel and colon are grossly normal. No obvious acute inflammatory process, mass lesion or obstructive findings. Vascular/Lymphatic: Heavy atherosclerotic calcifications involving the aorta  and branch vessels, particularly the renal arteries. Stable scattered mesenteric and retroperitoneal lymph nodes but no mass or overt adenopathy. Reproductive: The prostate gland and seminal vesicles are unremarkable. Other: Diffuse mesenteric edema and small volume abdominal/pelvic ascites. The pelvic fluid demonstrates higher attenuation which could suggest hemorrhage or peritonitis. No obvious active extravasation of contrast material. Coils are noted in the region of the gastroduodenal artery from previous embolization procedure. Musculoskeletal: Severe/aggressive degenerative changes involving the lumbar spine. Amyloid  spondyloarthropathy would certainly be a consideration given the patient's history of renal dialysis. IMPRESSION: 1. Enlarging bilateral pleural effusions since the prior CT scan from 2 days ago. Moderate on the right and small the left with overlying atelectasis. 2. Stable cardiac enlargement and vascular congestion but no overt pulmonary edema. 3. Reflux of contrast down the IVC consistent with right heart failure or tricuspid regurgitation. Changes of hepatic congestion are noted. 4. Diffuse body wall edema, mesenteric edema and abdominal/pelvic ascites. The ascites in the abdomen/pelvis is high in attenuation suggesting complex fluid or hemorrhage. No obvious extravasating contrast is identified. 5. Extensive vascular calcifications. 6. Severe and aggressive degenerative changes involving the lumbar spine. Amyloid spondyloarthropathy would be a consideration. Electronically Signed   By: Marijo Sanes M.D.   On: 08/07/2018 19:37    Cardiac Studies   Echocardiogram (08/06/2018):  1. The left ventricle has normal systolic function, with an ejection fraction of 55%. The cavity size was normal. There is mildly increased left ventricular wall thickness.  2. The right ventricle has moderately reduced systolic function. The cavity was moderately enlarged. There is no increase in right ventricular wall thickness. Right ventricular systolic pressure is mild to moderately elevated with an estimated pressure  of 43.6 mmHg.  3. Left atrial size was moderately dilated.  4. Right atrial size was moderately dilated.  5. Mitral valve regurgitation is moderate The MR jet is eccentric posteriorly directed.Unable to exclude mild prolapse of the anterior leaflet.  6. Tricuspid valve regurgitation is severe.  7. Pulmonic valve regurgitation is mild to moderate  8. Rhythm is atrial flutter  Patient Profile     63 y.o. male with a history of ischemic and nonischemic cardiomyopathy, CAD, chronic combined CHF, Rickeya Manus-stage  renal disease on hemodialysis, frequent PVCs/bigeminy, hepatitis C, GI bleed, anemia of chronic disease, pulmonary hypertension, hypertensive heart disease, PAD and presenting with chest pain, cough, and hypertension.  Assessment & Plan    Demand ischemia: Patient denies chest pain.  Troponin elevated yesterday, peaking at 2.1 last night.  I suspect the reflects supply-demand mismatch in the setting of known subtotal RCA occlusion and mild to moderate LAD/LCx disease complicated by atrial flutter with RVR and subsequent bradycardia.  EKG without acute ischemic changes.  Echocardiogram 2 days ago with preserved LVEF and no obvious wall motion abnormalities.  No plan for repeat cath/intervention at this time.  Continue medical therapy, including aspirin and statin.  Will add heparin x 48 hours for medical therapy.  Atrial aflutter/fibrillation: Patient bradycardic overnight with sinus bradycardia, Mobitz type 1 second degree AV block, and intermittent junction rhythm.  This AM, he had transient wide complex tachycardia most consistent with a-fib with RVR and aberrancy.  Ongoing vasopressor support with norepinephrine is likely exacerbating tachyarrhythmia.  Avoid AV-nodal blocking and heart-rate suppressing agents, including beta-blockers, calcium-channel blockers, and amiodarone.  Will have EP review strips and make recommendations regarding rate/rhythm management.  Defer long-term anticoagulation given history of GI bleeds.  No indication for temporary transvenous pacing at this time.  Shock:  I doubt this is primarily cardiogenic.  Lactic acid elevated at 6.6.  Wean norepinephrine, as tolerated.  Continue empiric antibiotics and evaluation for infectious etiology per CCM and IM.  S[place]-stage renal disease:  Dialysis per nephrology.  For questions or updates, please contact [place] Please consult [email or web address] for contact info under [facility].     Signed, Nelva Bush, MD  08/08/2018, 7:58 AM

## 2018-08-08 NOTE — Progress Notes (Signed)
Neuro: Pt A&OX3, drowsy, but easily arousable.   Respiratory: Pt on 3L o2 via [place] with o2 sats WNL.   Cardiovascular: Pt afebrile, BP soft and remains on vasopressors at this time, but quickly reducing rate. Pt now in NSR, but was in Afib earlier in day with rapid ventricular rate.   GI/GU: Pt anuric and started on CRRT. No BM throughout shift. Pt with poor PO intake and encouraged to eat and drink regularly.   Skin: Skin intact with no s/s of skin breakdown at this time. Pt able to reposition independently.    Pain: Pt with reports of generalized pain and received Morphine. Pt reported 100% relief.   Lines: Pt with PIV's X2, trialysis catheter that was placed today at the bedside and AV fistula in place, no signs or symptoms of complications at this time.   Drips: Pt remains on D10 @75ml /hr, and vasopressin. Norepinephrine turned off and ordered Neo was never started.   Labs: Pt with some improvement in potassium. Last result was 6.6.   Events: NO acute events throughout shift. Pt remains on vasopressors and CRRT at this time. Pts plan of care to continue with current regimen, Family updated and no further questions at this time.

## 2018-08-08 NOTE — Progress Notes (Signed)
Pharmacy CRRT Medication Monitoring and Adjustment Consult:  Pharmacy consulted to assist in monitoring and replacing electrolytes in this 63 y.o. male admitted on 08/18/2018 with Chest Pain   Labs:  Sodium (mmol/L)  Date Value  08/08/2018 131 (L)  04/30/2013 139   Potassium (mmol/L)  Date Value  08/08/2018 7.3 (HH)  04/30/2013 4.4   Magnesium (mg/dL)  Date Value  08/08/2018 2.1  11/05/2012 1.5 (L)   Phosphorus (mg/dL)  Date Value  08/06/2018 10.1 (H)  04/30/2013 4.6   Calcium (mg/dL)  Date Value  08/08/2018 8.4 (L)   Calcium, Total (mg/dL)  Date Value  04/30/2013 8.0 (L)   Albumin (g/dL)  Date Value  08/07/2018 3.4 (L)  04/30/2013 2.6 (L)    Assessment/Plan: Will transition cefepime to 2g IV Q12hr.   No further adjustments warranted at this time.   Pharmacy will continue to monitor and adjust per consult.   Simpson,Michael L 08/08/2018 3:27 PM

## 2018-08-08 NOTE — Progress Notes (Addendum)
[place] at [facility]                                                                                                                                                                                  Patient Demographics   [name], is a 63 y.o. male, DOB - Jul 01, 1955, QPY:[id number]  Admit date - 07/27/2018   Admitting Physician Dustin Flock, MD  Outpatient Primary MD for the patient is Donnie Coffin, MD   LOS - 3  Subjective: Patient currently drowsy and hypotensive   Review of Systems:   CONSTITUTIONAL: Patient drowsy Limited history obtainable  Vitals:   Vitals:   08/08/18 0500 08/08/18 0800 08/08/18 0900 08/08/18 1203  BP: 104/76  (!) 113/49   Pulse: 68 (!) 127 (!) 55   Resp: 16 (!) 21 14   Temp:      TempSrc:      SpO2: 100% 92% 96% 98%  Weight:      Height:        Wt Readings from Last 3 Encounters:  08/08/18 81.8 kg  05/09/18 78.3 kg  04/21/18 74.5 kg     Intake/Output Summary (Last 24 hours) at 08/08/2018 1251 Last data filed at 08/08/2018 0900 Gross per 24 hour  Intake 1361.91 ml  Output 0 ml  Net 1361.91 ml    Physical Exam:   GENERAL: Drowsy critically ill HEAD, EYES, EARS, NOSE AND THROAT: Atraumatic, normocephalic. Extraocular muscles are intact. Pupils equal and reactive to light. Sclerae anicteric. No conjunctival injection. No oro-pharyngeal erythema.  NECK: Supple. There is no jugular venous distention. No bruits, no lymphadenopathy, no thyromegaly.  HEART: Regular rate and rhythm,. No murmurs, no rubs, no clicks.  LUNGS: Clear to auscultation bilaterally. No rales or rhonchi. No wheezes.  ABDOMEN: Soft, flat, nontender, nondistended. Has good bowel sounds. No hepatosplenomegaly appreciated.  EXTREMITIES: No evidence of any cyanosis, clubbing, or peripheral edema.  +2 pedal and radial pulses bilaterally.  NEUROLOGIC: Drowsy  sKIN: Moist and warm with no rashes appreciated.  Psych: Not anxious,  depressed LN: No inguinal LN enlargement    Antibiotics   Anti-infectives (From admission, onward)   Start     Dose/Rate Route Frequency Ordered Stop   08/07/18 1200  ceFEPIme (MAXIPIME) 2 g in sodium chloride 0.9 % 100 mL IVPB     2 g 200 mL/hr over 30 Minutes Intravenous Every M-W-F (Hemodialysis) 07/29/2018 1250     08/07/18 1200  vancomycin (VANCOCIN) IVPB 750 mg/150 ml premix  Status:  Discontinued     750 mg 150 mL/hr over 60 Minutes Intravenous Every M-W-F (Hemodialysis) 08/19/2018 1250 08/07/18 2155   08/11/2018 1415  vancomycin (  VANCOCIN) IVPB 750 mg/150 ml premix     750 mg 150 mL/hr over 60 Minutes Intravenous  Once 08/18/2018 1250 08/18/2018 1600   08/18/2018 0730  ceFEPIme (MAXIPIME) 2 g in sodium chloride 0.9 % 100 mL IVPB     2 g 200 mL/hr over 30 Minutes Intravenous  Once 08/04/2018 0715 08/19/2018 0826   08/02/2018 0730  metroNIDAZOLE (FLAGYL) IVPB 500 mg     500 mg 100 mL/hr over 60 Minutes Intravenous  Once 07/30/2018 0715 08/07/2018 0908   08/12/2018 0730  vancomycin (VANCOCIN) IVPB 1000 mg/200 mL premix     1,000 mg 200 mL/hr over 60 Minutes Intravenous  Once 07/29/2018 0715 08/04/2018 0908      Medications   Scheduled Meds: . aspirin EC  81 mg Oral Daily  . atorvastatin  40 mg Oral Daily  . benzonatate  100 mg Oral TID  . calcium acetate  2,001 mg Oral TID WC  . cinacalcet  30 mg Oral 3 times weekly  . dextrose  1 ampule Intravenous Once  . docusate sodium  100 mg Oral Daily  . hydrocortisone sod succinate (SOLU-CORTEF) inj  50 mg Intravenous Q6H  . insulin aspart  10 Units Intravenous Once  . mouth rinse  15 mL Mouth Rinse BID  . midodrine  10 mg Oral TID WC  . pantoprazole (PROTONIX) IV  40 mg Intravenous Q12H  . sodium chloride flush  3 mL Intravenous Q12H   Continuous Infusions: . sodium chloride    . calcium gluconate 1,000 mg (08/08/18 1232)  . ceFEPime (MAXIPIME) IV    . dextrose 100 mL/hr at 08/08/18 1217  . norepinephrine (LEVOPHED) Adult infusion 30 mcg/min  (08/08/18 1210)   PRN Meds:.sodium chloride, acetaminophen **OR** acetaminophen, guaiFENesin, hydrOXYzine, ipratropium-albuterol, morphine injection, nitroGLYCERIN, ondansetron **OR** ondansetron (ZOFRAN) IV, oxyCODONE-acetaminophen, senna-docusate, sodium chloride flush   Data Review:   Micro Results Recent Results (from the past 240 hour(s))  Blood culture (routine x 2)     Status: None (Preliminary result)   Collection Time: 07/24/2018  7:46 AM   Specimen: BLOOD  Result Value Ref Range Status   Specimen Description BLOOD RFA  Final   Special Requests   Final    BOTTLES DRAWN AEROBIC AND ANAEROBIC Blood Culture results may not be optimal due to an excessive volume of blood received in culture bottles   Culture   Final    NO GROWTH 3 DAYS Performed at [facility] : [facility], [address], [place], [place] [address]    Report Status PENDING  Incomplete  Blood culture (routine x 2)     Status: None (Preliminary result)   Collection Time: 07/31/2018  7:47 AM   Specimen: BLOOD  Result Value Ref Range Status   Specimen Description BLOOD R AC  Final   Special Requests   Final    BOTTLES DRAWN AEROBIC AND ANAEROBIC Blood Culture results may not be optimal due to an excessive volume of blood received in culture bottles   Culture   Final    NO GROWTH 3 DAYS Performed at [facility], [address] [place], [place] [address]    Report Status PENDING  Incomplete  SARS Coronavirus 2 (CEPHEID- Performed in [place] hospital lab), Hosp Order     Status: None   Collection Time: 07/26/2018  8:34 AM   Specimen: Nasopharyngeal Swab  Result Value Ref Range Status   SARS Coronavirus 2 NEGATIVE NEGATIVE Final    Comment: (NOTE) If result is NEGATIVE SARS-CoV-2 target  nucleic acids are NOT DETECTED. The SARS-CoV-2 RNA is generally detectable in upper and lower  respiratory specimens during the acute phase of infection. The lowest  concentration of SARS-CoV-2 viral copies this assay  can detect is 250  copies / mL. A negative result does not preclude SARS-CoV-2 infection  and should not be used as the sole basis for treatment or other  patient management decisions.  A negative result may occur with  improper specimen collection / handling, submission of specimen other  than nasopharyngeal swab, presence of viral mutation(s) within the  areas targeted by this assay, and inadequate number of viral copies  (<250 copies / mL). A negative result must be combined with clinical  observations, patient history, and epidemiological information. If result is POSITIVE SARS-CoV-2 target nucleic acids are DETECTED. The SARS-CoV-2 RNA is generally detectable in upper and lower  respiratory specimens dur ing the acute phase of infection.  Positive  results are indicative of active infection with SARS-CoV-2.  Clinical  correlation with patient history and other diagnostic information is  necessary to determine patient infection status.  Positive results do  not rule out bacterial infection or co-infection with other viruses. If result is PRESUMPTIVE POSTIVE SARS-CoV-2 nucleic acids MAY BE PRESENT.   A presumptive positive result was obtained on the submitted specimen  and confirmed on repeat testing.  While 2019 novel coronavirus  (SARS-CoV-2) nucleic acids may be present in the submitted sample  additional confirmatory testing may be necessary for epidemiological  and / or clinical management purposes  to differentiate between  SARS-CoV-2 and other Sarbecovirus currently known to infect humans.  If clinically indicated additional testing with an alternate test  methodology [phone]) is advised. The SARS-CoV-2 RNA is generally  detectable in upper and lower respiratory sp ecimens during the acute  phase of infection. The expected result is Negative. Fact Sheet for Patients:  StrictlyIdeas.no Fact Sheet for Healthcare  Providers: BankingDealers.co.za This test is not yet approved or cleared by the [place] FDA and has been authorized for detection and/or diagnosis of SARS-CoV-2 by FDA under an Emergency Use Authorization (EUA).  This EUA will remain in effect (meaning this test can be used) for the duration of the COVID-19 declaration under Section 564(b)(1) of the Act, 21 U.S.C. section 360bbb-3(b)(1), unless the authorization is terminated or revoked sooner. Performed at [facility], [address] [place], [place] [address]   MRSA PCR Screening     Status: None   Collection Time: 08/12/2018  1:50 PM   Specimen: Nasal Mucosa; Nasopharyngeal  Result Value Ref Range Status   MRSA by PCR NEGATIVE NEGATIVE Final    Comment:        The GeneXpert MRSA Assay (FDA approved for NASAL specimens only), is one component of a comprehensive MRSA colonization surveillance program. It is not intended to diagnose MRSA infection nor to guide or monitor treatment for MRSA infections. Performed at [facility], [address] [place], [place] [address]     Radiology Reports Dg Chest 2 View  Result Date: 08/08/2018 CLINICAL DATA:  New cough and shortness of breath EXAM: CHEST - 2 VIEW COMPARISON:  May 10, 2018 FINDINGS: Vascular stent projects over the left subclavian region. Stable cardiomegaly. The hila, mediastinum, lungs, and pleura are normal. IMPRESSION: Persistent cardiomegaly.  No other abnormalities. Electronically Signed   By: Dorise Bullion III M.D   On: 08/12/2018 08:00   Ct Head Wo Contrast  Result Date: 08/07/2018 CLINICAL DATA:  Altered  mental status EXAM: CT HEAD WITHOUT CONTRAST TECHNIQUE: Contiguous axial images were obtained from the base of the skull through the vertex without intravenous contrast. COMPARISON:  05/04/2018 FINDINGS: Brain: No evidence of acute infarction, hemorrhage, hydrocephalus, extra-axial collection or mass lesion/mass  effect. Vascular: No hyperdense vessel or unexpected calcification. Skull: Normal. Negative for fracture or focal lesion. Sinuses/Orbits: Small, partially imaged air-fluid level of the left maxillary sinus. Other: Contrast opacification of the vascular structures and meninges, likely due to persistent circulating contrast in the setting of renal failure and hemodialysis. IMPRESSION: 1.  No acute intracranial pathology. 2. Small, partially imaged air-fluid level of the left maxillary sinus. Correlate for evidence of sinusitis. 3. Contrast opacification of the vascular structures and meninges, likely due to persistent circulating contrast in the setting of renal failure and hemodialysis. Electronically Signed   By: Eddie Candle M.D.   On: 08/07/2018 16:31   Ct Chest W Contrast  Result Date: 08/07/2018 CLINICAL DATA:  Chest pain and shortness of breath, abdominal pain and distension today. EXAM: CT CHEST, ABDOMEN, AND PELVIS WITH CONTRAST TECHNIQUE: Multidetector CT imaging of the chest, abdomen and pelvis was performed following the standard protocol during bolus administration of intravenous contrast. CONTRAST:  117mL OMNIPAQUE IOHEXOL 300 MG/ML  SOLN COMPARISON:  Chest CT 07/26/2018 and abdominal CT scan 05/04/2018 FINDINGS: CT CHEST FINDINGS Cardiovascular: The heart is enlarged but stable. No pericardial effusion. Stable tortuosity, ectasia and heavy calcification of the thoracic aorta. Stable three-vessel coronary artery calcifications. Stable left brachiocephalic vein stent. Enlarged pulmonary arteries consistent with pulmonary hypertension. Mediastinum/Nodes: Stable scattered mediastinal and hilar lymph nodes but no mass or overt adenopathy. The esophagus is grossly normal. Lungs/Pleura: Small to moderate-sized right effusion and small diffusion both larger when compared to prior recent chest CT. There is also overlying atelectasis. No overt pulmonary edema, focal pulmonary infiltrates or worrisome  pulmonary lesions. Musculoskeletal: No chest wall masses identified. There is diffuse body wall edema noted suggesting anasarca. No supraclavicular or axillary lymphadenopathy the. The bony structures are unremarkable and appears stable. CT ABDOMEN PELVIS FINDINGS Hepatobiliary: Moderate reflux of contrast down the IVC and into the hepatic veins suggesting right heart failure or tricuspid regurgitation. Liver demonstrates changes suggesting hepatic congestion. No worrisome hepatic lesions or intrahepatic biliary dilatation. Contrast noted in the gallbladder related to the recent chest CT with contrast. Pancreas: No mass, inflammation or ductal dilatation. Spleen: Normal size.  No focal lesions. Adrenals/Urinary Tract: The adrenal glands are unremarkable and stable. Small kidneys with extensive arterial calcifications. No worrisome renal lesions. The bladder is grossly normal. Stomach/Bowel: The stomach, duodenum, small bowel and colon are grossly normal. No obvious acute inflammatory process, mass lesion or obstructive findings. Vascular/Lymphatic: Heavy atherosclerotic calcifications involving the aorta and branch vessels, particularly the renal arteries. Stable scattered mesenteric and retroperitoneal lymph nodes but no mass or overt adenopathy. Reproductive: The prostate gland and seminal vesicles are unremarkable. Other: Diffuse mesenteric edema and small volume abdominal/pelvic ascites. The pelvic fluid demonstrates higher attenuation which could suggest hemorrhage or peritonitis. No obvious active extravasation of contrast material. Coils are noted in the region of the gastroduodenal artery from previous embolization procedure. Musculoskeletal: Severe/aggressive degenerative changes involving the lumbar spine. Amyloid spondyloarthropathy would certainly be a consideration given the patient's history of renal dialysis. IMPRESSION: 1. Enlarging bilateral pleural effusions since the prior CT scan from 2 days ago.  Moderate on the right and small the left with overlying atelectasis. 2. Stable cardiac enlargement and vascular congestion but no overt pulmonary edema. 3.  Reflux of contrast down the IVC consistent with right heart failure or tricuspid regurgitation. Changes of hepatic congestion are noted. 4. Diffuse body wall edema, mesenteric edema and abdominal/pelvic ascites. The ascites in the abdomen/pelvis is high in attenuation suggesting complex fluid or hemorrhage. No obvious extravasating contrast is identified. 5. Extensive vascular calcifications. 6. Severe and aggressive degenerative changes involving the lumbar spine. Amyloid spondyloarthropathy would be a consideration. Electronically Signed   By: Marijo Sanes M.D.   On: 08/07/2018 19:37   Ct Angio Chest Pe W And/or Wo Contrast  Result Date: 08/07/2018 CLINICAL DATA:  Chest pain. EXAM: CT ANGIOGRAPHY CHEST WITH CONTRAST TECHNIQUE: Multidetector CT imaging of the chest was performed using the standard protocol during bolus administration of intravenous contrast. Multiplanar CT image reconstructions and MIPs were obtained to evaluate the vascular anatomy. CONTRAST:  57mL OMNIPAQUE IOHEXOL 350 MG/ML SOLN COMPARISON:  Chest x-ray August 05, 2018.  Chest CT Jul 04, 2016. FINDINGS: Cardiovascular: Cardiomegaly is noted. A stent is seen in the left brachiocephalic vein. Patency cannot be evaluated due to lack of contrast in these vessels due to a right-sided injection. Atherosclerotic changes are seen in the aorta. The ascending thoracic aorta measures up to 4.4 cm, unchanged since May of 2018. No dissection identified. The main pulmonary artery measures 3.7 cm. No pulmonary emboli. Mediastinum/Nodes: The thyroid and esophagus are normal. No adenopathy. Tiny pleural effusions are noted. No pericardial effusion. Lungs/Pleura: Left retrocardiac atelectasis is noted. No other suspicious infiltrates. Central airways are normal. No pneumothorax. Upper Abdomen: No acute  abnormality. Musculoskeletal: No chest wall abnormality. No acute or significant osseous findings. Review of the MIP images confirms the above findings. IMPRESSION: 1. No pulmonary emboli identified. 2. Cardiomegaly.  Tiny pleural effusions. 3. There is a stent in the left brachiocephalic vein. Patency cannot be evaluated due to a right-sided injection. 4. Atherosclerotic changes in the aorta. 5. 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 Recommend annual imaging followup by CTA or MRA. This recommendation follows 2010 ACCF/AHA/AATS/ACR/ASA/SCA/SCAI/SIR/STS/SVM Guidelines for the Diagnosis and Management of Patients with Thoracic Aortic Disease. Circulation. 2010; 121: Y185-U314. Aortic aneurysm NOS (ICD10-I71.9) 6. The main pulmonary artery measures 3.7 cm which raises the possibility of pulmonary arterial hypertension. 7. Mild left basilar atelectasis in the retrocardiac region. Aortic Atherosclerosis (ICD10-I70.0). Electronically Signed   By: Dorise Bullion III M.D   On: 07/28/2018 10:29   Ct Abdomen Pelvis W Contrast  Result Date: 08/07/2018 CLINICAL DATA:  Chest pain and shortness of breath, abdominal pain and distension today. EXAM: CT CHEST, ABDOMEN, AND PELVIS WITH CONTRAST TECHNIQUE: Multidetector CT imaging of the chest, abdomen and pelvis was performed following the standard protocol during bolus administration of intravenous contrast. CONTRAST:  145mL OMNIPAQUE IOHEXOL 300 MG/ML  SOLN COMPARISON:  Chest CT 08/19/2018 and abdominal CT scan 05/04/2018 FINDINGS: CT CHEST FINDINGS Cardiovascular: The heart is enlarged but stable. No pericardial effusion. Stable tortuosity, ectasia and heavy calcification of the thoracic aorta. Stable three-vessel coronary artery calcifications. Stable left brachiocephalic vein stent. Enlarged pulmonary arteries consistent with pulmonary hypertension. Mediastinum/Nodes: Stable scattered mediastinal and hilar lymph nodes but no  mass or overt adenopathy. The esophagus is grossly normal. Lungs/Pleura: Small to moderate-sized right effusion and small diffusion both larger when compared to prior recent chest CT. There is also overlying atelectasis. No overt pulmonary edema, focal pulmonary infiltrates or worrisome pulmonary lesions. Musculoskeletal: No chest wall masses identified. There is diffuse body wall edema noted suggesting anasarca. No supraclavicular or  axillary lymphadenopathy the. The bony structures are unremarkable and appears stable. CT ABDOMEN PELVIS FINDINGS Hepatobiliary: Moderate reflux of contrast down the IVC and into the hepatic veins suggesting right heart failure or tricuspid regurgitation. Liver demonstrates changes suggesting hepatic congestion. No worrisome hepatic lesions or intrahepatic biliary dilatation. Contrast noted in the gallbladder related to the recent chest CT with contrast. Pancreas: No mass, inflammation or ductal dilatation. Spleen: Normal size.  No focal lesions. Adrenals/Urinary Tract: The adrenal glands are unremarkable and stable. Small kidneys with extensive arterial calcifications. No worrisome renal lesions. The bladder is grossly normal. Stomach/Bowel: The stomach, duodenum, small bowel and colon are grossly normal. No obvious acute inflammatory process, mass lesion or obstructive findings. Vascular/Lymphatic: Heavy atherosclerotic calcifications involving the aorta and branch vessels, particularly the renal arteries. Stable scattered mesenteric and retroperitoneal lymph nodes but no mass or overt adenopathy. Reproductive: The prostate gland and seminal vesicles are unremarkable. Other: Diffuse mesenteric edema and small volume abdominal/pelvic ascites. The pelvic fluid demonstrates higher attenuation which could suggest hemorrhage or peritonitis. No obvious active extravasation of contrast material. Coils are noted in the region of the gastroduodenal artery from previous embolization procedure.  Musculoskeletal: Severe/aggressive degenerative changes involving the lumbar spine. Amyloid spondyloarthropathy would certainly be a consideration given the patient's history of renal dialysis. IMPRESSION: 1. Enlarging bilateral pleural effusions since the prior CT scan from 2 days ago. Moderate on the right and small the left with overlying atelectasis. 2. Stable cardiac enlargement and vascular congestion but no overt pulmonary edema. 3. Reflux of contrast down the IVC consistent with right heart failure or tricuspid regurgitation. Changes of hepatic congestion are noted. 4. Diffuse body wall edema, mesenteric edema and abdominal/pelvic ascites. The ascites in the abdomen/pelvis is high in attenuation suggesting complex fluid or hemorrhage. No obvious extravasating contrast is identified. 5. Extensive vascular calcifications. 6. Severe and aggressive degenerative changes involving the lumbar spine. Amyloid spondyloarthropathy would be a consideration. Electronically Signed   By: Marijo Sanes M.D.   On: 08/07/2018 19:37   Ct Angio Chest/abd/pel For Dissection W And/or Wo Contrast  Result Date: 08/02/2018 CLINICAL DATA:  Chest pain for 4 days.  Cough. EXAM: CT ANGIOGRAPHY CHEST, ABDOMEN AND PELVIS TECHNIQUE: Multidetector CT imaging through the chest, abdomen and pelvis was performed using the standard protocol during bolus administration of intravenous contrast. Multiplanar reconstructed images and MIPs were obtained and reviewed to evaluate the vascular anatomy. CONTRAST:  189mL ISOVUE-370 IOPAMIDOL (ISOVUE-370) INJECTION 76% COMPARISON:  CT of the chest August 05, 2018. CT of the abdomen and pelvis May 04, 2018 FINDINGS: CTA CHEST FINDINGS Cardiovascular: Cardiomegaly. Coronary artery calcifications identified. The pulmonary arteries were better assessed on the recent CT a of the chest for PE. No pulmonary emboli in the central pulmonary arteries. The patient had a normal PE study earlier today as well.  Atherosclerotic changes are seen in the thoracic aorta without dissection. The ascending thoracic aorta measures 4.2 cm on today's study, mildly aneurysmal. Mediastinum/Nodes: There is a stent in the left brachiocephalic vein which is well opacified on this study. The thyroid and esophagus are normal. A borderline node anterior to the trachea measuring 14 mm on axial image 40 is stable since 2018, likely reactive. No other evidence of adenopathy identified. Small pleural effusions. No pericardial effusion. Mild increased attenuation in the subcutaneous fat suggests volume overload. Lungs/Pleura: Tiny pleural effusions. Central airways are normal. Mild atelectasis in the posterior left upper lobe. Mild atelectasis in the retrocardiac region, stable. No pneumothorax. Central airways are  unchanged no suspicious pulmonary nodules, masses, or focal infiltrates. Musculoskeletal: There is a healed anterior left rib fracture. No acute bony abnormalities. Review of the MIP images confirms the above findings. CTA ABDOMEN AND PELVIS FINDINGS VASCULAR Aorta: Atherosclerotic change is seen in the nonaneurysmal abdominal aorta, extending into the iliac and femoral vessels. No dissection. Celiac: Patent without evidence of aneurysm, dissection, vasculitis or significant stenosis. SMA: Atherosclerotic changes near the origin of the SMA without significant stenosis. No other abnormalities. Renals: Atherosclerosis at the origin of the bilateral renal arteries is identified. IMA: Patent without evidence of aneurysm, dissection, vasculitis or significant stenosis. Inflow: Patent without evidence of aneurysm, dissection, vasculitis or significant stenosis. Veins: No obvious venous abnormality within the limitations of this arterial phase study. Review of the MIP images confirms the above findings. NON-VASCULAR Hepatobiliary: No focal liver abnormality is seen. No gallstones, gallbladder wall thickening, or biliary dilatation. Pancreas:  Unremarkable. No pancreatic ductal dilatation or surrounding inflammatory changes. Spleen: Normal in size without focal abnormality. Adrenals/Urinary Tract: Adrenal glands are normal. The kidneys are atrophic consistent with renal disease. No hydronephrosis or acute perinephric stranding. The ureters are normal. The bladder is poorly distended but unremarkable. Stomach/Bowel: The stomach and small bowel are unremarkable. The colon is normal. The appendix is unremarkable. Lymphatic: No significant vascular findings are present. No enlarged abdominal or pelvic lymph nodes. Reproductive: Prostate is unremarkable. Other: There is increased attenuation in the subcutaneous fat and in the intra-abdominal fat consistent volume overload. There is a small amount of fluid in the right pericolic gutter. Some of this fluid is adjacent to the appendix but there is no convincing evidence of appendicitis. Musculoskeletal: No acute or significant osseous findings. Review of the MIP images confirms the above findings. IMPRESSION: 1. 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 This is stable since 2018. No dissection. Atherosclerotic changes noted. Recommend annual imaging followup by CTA or MRA. This recommendation follows 2010 ACCF/AHA/AATS/ACR/ASA/SCA/SCAI/SIR/STS/SVM Guidelines for the Diagnosis and Management of Patients with Thoracic Aortic Disease. Circulation. 2010; 121: D532-D924. Aortic aneurysm NOS (ICD10-I71.9) 2. Cardiomegaly.  Coronary artery calcifications. 3. Increased attenuation in the subcutaneous fat diffusely as well as in the intra-abdominal fat with a small amount of fluid in the right pericolic gutter. These findings are most consistent with volume overload. 4. Tiny bilateral pleural effusions. Electronically Signed   By: Dorise Bullion III M.D   On: 08/06/2018 11:36     CBC Recent Labs  Lab 08/01/2018 0710 08/06/18 0045 08/07/18 2041 08/08/18 0957  WBC  7.3 5.9  --  9.9  HGB 10.1* 10.4* 11.7* 11.5*  HCT 31.6* 32.9*  --  37.6*  PLT 150 114*  --  108*  MCV 87.8 88.0  --  89.7  MCH 28.1 27.8  --  27.4  MCHC 32.0 31.6  --  30.6  RDW 19.1* 19.5*  --  19.7*  LYMPHSABS 0.8  --   --  0.6*  MONOABS 1.8*  --   --  1.3*  EOSABS 0.1  --   --  0.0  BASOSABS 0.1  --   --  0.0    Chemistries  Recent Labs  Lab 08/02/2018 0710 08/06/18 0045 08/06/18 0303 08/07/18 1513 08/08/18 0957  NA 141 135 136  --  131*  K 4.4 4.8 5.0  --  7.3*  CL 105 100 101  --  96*  CO2 23 19* 19*  --  13*  GLUCOSE 78 88 80  --  101*  BUN 45* 55* 57*  --  87*  CREATININE 5.27* 6.14* 6.31*  --  8.88*  CALCIUM 8.3* 8.2* 8.4*  --  8.4*  MG  --  1.7 1.7  --   --   AST 26  --   --  51*  --   ALT 21  --   --  27  --   ALKPHOS 81  --   --  87  --   BILITOT 1.0  --   --  1.0  --    ------------------------------------------------------------------------------------------------------------------ estimated creatinine clearance is 8.1 mL/min (A) (by C-G formula based on SCr of 8.88 mg/dL (H)). ------------------------------------------------------------------------------------------------------------------ Recent Labs    08/07/18 1513  HGBA1C 5.3   ------------------------------------------------------------------------------------------------------------------ No results for input(s): CHOL, HDL, LDLCALC, TRIG, CHOLHDL, LDLDIRECT in the last 72 hours. ------------------------------------------------------------------------------------------------------------------ Recent Labs    08/16/2018 1833  TSH 2.410   ------------------------------------------------------------------------------------------------------------------ No results for input(s): VITAMINB12, FOLATE, FERRITIN, TIBC, IRON, RETICCTPCT in the last 72 hours.  Coagulation profile No results for input(s): INR, PROTIME in the last 168 hours.  No results for input(s): DDIMER in the last 72 hours.  Cardiac  Enzymes Recent Labs  Lab 08/07/18 1855 08/08/18 0115 08/08/18 0957  TROPONINI 2.05* 1.92* 1.73*   ------------------------------------------------------------------------------------------------------------------ Invalid input(s): POCBNP    Assessment & Plan   IMPRESSION AND PLAN: Patient 63 year old with multiple medical problems presenting with chest pain and hypotension  1.  Hypotension etiology unclear Continue midodrine No evidence of infection  Patient on Levophed now Cortisol level normal 2.  Atrial flutter with intermittent bradycardia Possible sick sinus syndrome cardiology following  3.  Chest pain etiology unclear CT scan of the chest done on admission 2 days ago shows no evidence of dissection Now troponin elevated cardiology following further recommends per cardiology unable to heparinize due to recent severe bleeding  4.  End-stage renal disease fluid removal per nephrology  5.    Hypoglycemia with type 2 diabetes patient not on any medications his insulin level and C-peptide levels suggest possible insulinoma continue dextrose containing fluids follow blood sugar closely  6.  Hyperlipidemia continue Lipitor  7.    Anasarca and ascites continue supportive care with fluid removal  8.  Chronic systolic and diastolic CHF Fluid removal per nephrology  9.  Cocaine abuse  Prognosis is very poor     Code Status Orders  (From admission, onward)         Start     Ordered   08/04/2018 1507  Full code  Continuous     08/04/2018 1506        Code Status History    Date Active Date Inactive Code Status Order ID Comments User Context   05/03/2018 1843 05/10/2018 1609 Full Code [id number]  Bettey Costa, MD ED   04/18/2018 0420 04/22/2018 0122 Full Code [id number]  Harrie Foreman, MD Inpatient   02/08/2018 0233 02/08/2018 2214 Full Code [id number]  Lance Coon, MD ED   03/14/2017 0007 03/14/2017 2204 Full Code [id number]  Amelia Jo, MD Inpatient    02/15/2017 2136 02/18/2017 1812 Full Code [id number]  Demetrios Loll, MD Inpatient   09/20/2016 0318 09/21/2016 1915 Full Code [id number]  Lance Coon, MD ED   08/22/2016 1658 08/24/2016 1607 Full Code [id number]  Demetrios Loll, MD Inpatient   07/04/2016 1929 07/05/2016 1728 Full Code [id number]  Idelle Crouch, MD Inpatient   06/07/2016 0102 06/08/2016 1514 Full Code [id number]  Panora, Henderson, DO Inpatient   04/30/2016 0007 05/01/2016 1821  Full Code [id number]  Lance Coon, MD Inpatient   02/26/2016 2107 02/28/2016 1804 Full Code [id number]  Lance Coon, MD Inpatient   01/07/2016 0132 01/07/2016 1724 Full Code [id number]  Harvie Bridge, DO ED   08/02/2015 2316 08/04/2015 2311 Full Code [id number]  Quintella Baton, MD Inpatient   12/02/2014 2052 12/04/2014 1753 Full Code [id number]  Vaughan Basta, MD Inpatient   11/10/2014 2323 11/12/2014 1506 Full Code [id number]  Gladstone Lighter, MD Inpatient   Advance Care Planning Activity           Consults   Nephrology, pulmonary critical   DVT Prophylaxis  scd's  Lab Results  Component Value Date   PLT 108 (L) 08/08/2018     Time Spent in minutes  46min  Greater than 50% of time spent in care coordination and counseling patient regarding the condition and plan of care.   Dustin Flock M.D on 08/08/2018 at 12:51 PM  Between 7am to 6pm - Pager - [phone]  After 6pm go to [email or web address] - Proofreader  [employer]   Office  [phone]

## 2018-08-08 NOTE — Procedures (Signed)
Central Venous Dailysis Catheter Placement: TRIPLE LUMEN TRIALYSIS Indication: Hemo Dialysis/CRRT   Consent:verbal   Hand washing performed prior to starting the procedure.   Procedure: An active timeout was performed and correct patient, name, & ID confirmed. Patient was positioned correctly for central venous access. Patient was prepped using strict sterile technique including chlorohexadine preps, sterile drape, sterile gown and sterile gloves.  The area was prepped, draped and anesthetized in the usual sterile manner. Patient comfort was obtained.    A Double lumen catheter was placed in RT IJ Vein There was good blood return, catheter caps were placed on lumens, catheter flushed easily, the line was secured and a sterile dressing and BIO-PATCH applied.   Ultrasound was used to visualize vasculature and guidance of needle.   Number of Attempts: 1 Complications:none  Estimated Blood Loss: none Operator: Enis Leatherwood.   Corrin Parker, M.D.  [facility]  Medical Director [place] Director [facility] Cardio-Pulmonary Department

## 2018-08-09 ENCOUNTER — Inpatient Hospital Stay: Payer: Medicare Other

## 2018-08-09 LAB — RENAL FUNCTION PANEL
Albumin: 3.4 g/dL — ABNORMAL LOW (ref 3.5–5.0)
Albumin: 3.3 g/dL — ABNORMAL LOW (ref 3.5–5.0)
Albumin: 3.4 g/dL — ABNORMAL LOW (ref 3.5–5.0)
Albumin: 3.3 g/dL — ABNORMAL LOW (ref 3.5–5.0)
Albumin: 2.9 g/dL — ABNORMAL LOW (ref 3.5–5.0)
Albumin: 3 g/dL — ABNORMAL LOW (ref 3.5–5.0)
Anion gap: 25 — ABNORMAL HIGH (ref 5–15)
Anion gap: 20 — ABNORMAL HIGH (ref 5–15)
Anion gap: 20 — ABNORMAL HIGH (ref 5–15)
Anion gap: 17 — ABNORMAL HIGH (ref 5–15)
Anion gap: 14 (ref 5–15)
Anion gap: 13 (ref 5–15)
BUN: 69 mg/dL — ABNORMAL HIGH (ref 8–23)
BUN: 59 mg/dL — ABNORMAL HIGH (ref 8–23)
BUN: 57 mg/dL — ABNORMAL HIGH (ref 8–23)
BUN: 47 mg/dL — ABNORMAL HIGH (ref 8–23)
BUN: 40 mg/dL — ABNORMAL HIGH (ref 8–23)
BUN: 39 mg/dL — ABNORMAL HIGH (ref 8–23)
CO2: 13 mmol/L — ABNORMAL LOW (ref 22–32)
CO2: 17 mmol/L — ABNORMAL LOW (ref 22–32)
CO2: 20 mmol/L — ABNORMAL LOW (ref 22–32)
CO2: 22 mmol/L (ref 22–32)
CO2: 24 mmol/L (ref 22–32)
CO2: 25 mmol/L (ref 22–32)
Calcium: 8.8 mg/dL — ABNORMAL LOW (ref 8.9–10.3)
Calcium: 9.2 mg/dL (ref 8.9–10.3)
Calcium: 9.5 mg/dL (ref 8.9–10.3)
Calcium: 9.5 mg/dL (ref 8.9–10.3)
Calcium: 8.8 mg/dL — ABNORMAL LOW (ref 8.9–10.3)
Calcium: 8.9 mg/dL (ref 8.9–10.3)
Chloride: 94 mmol/L — ABNORMAL LOW (ref 98–111)
Chloride: 97 mmol/L — ABNORMAL LOW (ref 98–111)
Chloride: 95 mmol/L — ABNORMAL LOW (ref 98–111)
Chloride: 97 mmol/L — ABNORMAL LOW (ref 98–111)
Chloride: 98 mmol/L (ref 98–111)
Chloride: 98 mmol/L (ref 98–111)
Creatinine, Ser: 6.91 mg/dL — ABNORMAL HIGH (ref 0.61–1.24)
Creatinine, Ser: 5.53 mg/dL — ABNORMAL HIGH (ref 0.61–1.24)
Creatinine, Ser: 5.36 mg/dL — ABNORMAL HIGH (ref 0.61–1.24)
Creatinine, Ser: 4.31 mg/dL — ABNORMAL HIGH (ref 0.61–1.24)
Creatinine, Ser: 3.72 mg/dL — ABNORMAL HIGH (ref 0.61–1.24)
Creatinine, Ser: 3.49 mg/dL — ABNORMAL HIGH (ref 0.61–1.24)
GFR calc Af Amer: 9 mL/min — ABNORMAL LOW (ref >60)
GFR calc Af Amer: 12 mL/min — ABNORMAL LOW (ref >60)
GFR calc Af Amer: 12 mL/min — ABNORMAL LOW (ref >60)
GFR calc Af Amer: 16 mL/min — ABNORMAL LOW (ref >60)
GFR calc Af Amer: 19 mL/min — ABNORMAL LOW (ref >60)
GFR calc Af Amer: 20 mL/min — ABNORMAL LOW (ref >60)
GFR calc non Af Amer: 8 mL/min — ABNORMAL LOW (ref >60)
GFR calc non Af Amer: 10 mL/min — ABNORMAL LOW (ref >60)
GFR calc non Af Amer: 10 mL/min — ABNORMAL LOW (ref >60)
GFR calc non Af Amer: 14 mL/min — ABNORMAL LOW (ref >60)
GFR calc non Af Amer: 16 mL/min — ABNORMAL LOW (ref >60)
GFR calc non Af Amer: 17 mL/min — ABNORMAL LOW (ref >60)
Glucose, Bld: 132 mg/dL — ABNORMAL HIGH (ref 70–99)
Glucose, Bld: 131 mg/dL — ABNORMAL HIGH (ref 70–99)
Glucose, Bld: 140 mg/dL — ABNORMAL HIGH (ref 70–99)
Glucose, Bld: 160 mg/dL — ABNORMAL HIGH (ref 70–99)
Glucose, Bld: 160 mg/dL — ABNORMAL HIGH (ref 70–99)
Glucose, Bld: 192 mg/dL — ABNORMAL HIGH (ref 70–99)
Phosphorus: 11.2 mg/dL — ABNORMAL HIGH (ref 2.5–4.6)
Phosphorus: 8.6 mg/dL — ABNORMAL HIGH (ref 2.5–4.6)
Phosphorus: 8.2 mg/dL — ABNORMAL HIGH (ref 2.5–4.6)
Phosphorus: 6.2 mg/dL — ABNORMAL HIGH (ref 2.5–4.6)
Phosphorus: 5.3 mg/dL — ABNORMAL HIGH (ref 2.5–4.6)
Phosphorus: 5.5 mg/dL — ABNORMAL HIGH (ref 2.5–4.6)
Potassium: 7 mmol/L (ref 3.5–5.1)
Potassium: 4.3 mmol/L (ref 3.5–5.1)
Potassium: 4.7 mmol/L (ref 3.5–5.1)
Potassium: 4.1 mmol/L (ref 3.5–5.1)
Potassium: 3.7 mmol/L (ref 3.5–5.1)
Potassium: 3.7 mmol/L (ref 3.5–5.1)
Sodium: 132 mmol/L — ABNORMAL LOW (ref 135–145)
Sodium: 134 mmol/L — ABNORMAL LOW (ref 135–145)
Sodium: 135 mmol/L (ref 135–145)
Sodium: 136 mmol/L (ref 135–145)
Sodium: 136 mmol/L (ref 135–145)
Sodium: 136 mmol/L (ref 135–145)

## 2018-08-09 LAB — CBC
HCT: 34 % — ABNORMAL LOW (ref 39.0–52.0)
Hemoglobin: 10.5 g/dL — ABNORMAL LOW (ref 13.0–17.0)
MCH: 27.4 pg (ref 26.0–34.0)
MCHC: 30.9 g/dL (ref 30.0–36.0)
MCV: 88.8 fL (ref 80.0–100.0)
Platelets: 95 10*3/uL — ABNORMAL LOW (ref 150–400)
RBC: 3.83 MIL/uL — ABNORMAL LOW (ref 4.22–5.81)
RDW: 19.3 % — ABNORMAL HIGH (ref 11.5–15.5)
WBC: 12.7 10*3/uL — ABNORMAL HIGH (ref 4.0–10.5)
nRBC: 1.1 % — ABNORMAL HIGH (ref 0.0–0.2)

## 2018-08-09 LAB — LACTIC ACID, PLASMA
Lactic Acid, Venous: 9.5 mmol/L (ref 0.5–1.9)
Lactic Acid, Venous: 5.6 mmol/L (ref 0.5–1.9)

## 2018-08-09 LAB — GLUCOSE, CAPILLARY
Glucose-Capillary: 109 mg/dL — ABNORMAL HIGH (ref 70–99)
Glucose-Capillary: 113 mg/dL — ABNORMAL HIGH (ref 70–99)
Glucose-Capillary: 121 mg/dL — ABNORMAL HIGH (ref 70–99)
Glucose-Capillary: 157 mg/dL — ABNORMAL HIGH (ref 70–99)
Glucose-Capillary: 171 mg/dL — ABNORMAL HIGH (ref 70–99)
Glucose-Capillary: 68 mg/dL — ABNORMAL LOW (ref 70–99)

## 2018-08-09 LAB — COOXEMETRY PANEL

## 2018-08-09 LAB — POTASSIUM
Potassium: 5.6 mmol/L — ABNORMAL HIGH (ref 3.5–5.1)
Potassium: 5.5 mmol/L — ABNORMAL HIGH (ref 3.5–5.1)

## 2018-08-09 LAB — MAGNESIUM
Magnesium: 2.1 mg/dL (ref 1.7–2.4)
Magnesium: 1.7 mg/dL (ref 1.7–2.4)
Magnesium: 1.8 mg/dL (ref 1.7–2.4)
Magnesium: 1.7 mg/dL (ref 1.7–2.4)
Magnesium: 1.7 mg/dL (ref 1.7–2.4)
Magnesium: 1.8 mg/dL (ref 1.7–2.4)

## 2018-08-09 LAB — APTT: aPTT: 46 seconds — ABNORMAL HIGH (ref 24–36)

## 2018-08-09 LAB — PROCALCITONIN: Procalcitonin: 4.89 ng/mL

## 2018-08-09 LAB — CORTISOL: Cortisol, Plasma: >100 ug/dL

## 2018-08-09 MED ORDER — FENTANYL CITRATE (PF) 100 MCG/2ML IJ SOLN
12.5000 ug | Freq: Once | INTRAMUSCULAR | Status: AC
  Administered 2018-08-09: 12.5 ug via INTRAVENOUS

## 2018-08-09 MED ORDER — FENTANYL CITRATE (PF) 100 MCG/2ML IJ SOLN
INTRAMUSCULAR | Status: AC
  Administered 2018-08-09: 22:00:00
  Filled 2018-08-09: qty 2

## 2018-08-09 MED ORDER — LORAZEPAM 2 MG/ML IJ SOLN
INTRAMUSCULAR | Status: AC
  Administered 2018-08-09: 1 mg via INTRAVENOUS
  Filled 2018-08-09: qty 1

## 2018-08-09 MED ORDER — SODIUM CHLORIDE 0.9% FLUSH
10.0000 mL | Freq: Two times a day (BID) | INTRAVENOUS | Status: DC
  Administered 2018-08-09 – 2018-08-10 (×2): 10 mL
  Administered 2018-08-10: 40 mL
  Administered 2018-08-11: 10 mL
  Administered 2018-08-11 – 2018-08-12 (×2): 40 mL
  Administered 2018-08-12: 10 mL
  Administered 2018-08-13: 40 mL

## 2018-08-09 MED ORDER — CALCIUM GLUCONATE-NACL 1-0.675 GM/50ML-% IV SOLN
1.0000 g | Freq: Once | INTRAVENOUS | Status: AC
  Administered 2018-08-09: 1000 mg via INTRAVENOUS
  Filled 2018-08-09: qty 50

## 2018-08-09 MED ORDER — DIPHENHYDRAMINE HCL 50 MG/ML IJ SOLN
12.5000 mg | Freq: Four times a day (QID) | INTRAMUSCULAR | Status: DC | PRN
  Administered 2018-08-09: 12.5 mg via INTRAVENOUS
  Filled 2018-08-09: qty 1

## 2018-08-09 MED ORDER — HALOPERIDOL LACTATE 5 MG/ML IJ SOLN
2.5000 mg | Freq: Four times a day (QID) | INTRAMUSCULAR | Status: DC | PRN
  Administered 2018-08-10 – 2018-08-11 (×2): 5 mg via INTRAVENOUS
  Filled 2018-08-09 (×2): qty 1

## 2018-08-09 MED ORDER — DIPHENHYDRAMINE HCL 50 MG/ML IJ SOLN
12.5000 mg | INTRAMUSCULAR | Status: DC | PRN
  Administered 2018-08-09 – 2018-08-12 (×4): 12.5 mg via INTRAVENOUS
  Filled 2018-08-09 (×4): qty 1

## 2018-08-09 MED ORDER — SODIUM POLYSTYRENE SULFONATE 15 GM/60ML PO SUSP
30.0000 g | Freq: Once | ORAL | Status: AC
  Administered 2018-08-09: 30 g via ORAL
  Filled 2018-08-09: qty 120

## 2018-08-09 MED ORDER — LORAZEPAM 2 MG/ML IJ SOLN
1.0000 mg | Freq: Once | INTRAMUSCULAR | Status: AC
  Administered 2018-08-09: 23:00:00 1 mg via INTRAVENOUS

## 2018-08-09 MED ORDER — INSULIN REGULAR HUMAN 100 UNIT/ML IJ SOLN
10.0000 [IU] | Freq: Once | INTRAMUSCULAR | Status: AC
  Administered 2018-08-09: 04:00:00 10 [IU] via INTRAVENOUS
  Filled 2018-08-09: qty 10

## 2018-08-09 MED ORDER — SODIUM BICARBONATE 8.4 % IV SOLN
100.0000 meq | Freq: Once | INTRAVENOUS | Status: AC
  Administered 2018-08-09: 100 meq via INTRAVENOUS
  Filled 2018-08-09: qty 50

## 2018-08-09 MED ORDER — DIPHENHYDRAMINE HCL 50 MG/ML IJ SOLN
25.0000 mg | Freq: Once | INTRAMUSCULAR | Status: AC
  Administered 2018-08-09: 25 mg via INTRAVENOUS
  Filled 2018-08-09: qty 1

## 2018-08-09 MED ORDER — SODIUM BICARBONATE 8.4 % IV SOLN
INTRAVENOUS | Status: DC
  Administered 2018-08-09: 04:00:00 via INTRAVENOUS
  Filled 2018-08-09 (×2): qty 150

## 2018-08-09 MED ORDER — SODIUM CHLORIDE 0.9% FLUSH: 10.0000 mL | INTRAVENOUS | Status: DC | PRN

## 2018-08-09 MED ORDER — DEXTROSE 50 % IV SOLN
INTRAVENOUS | Status: AC
  Filled 2018-08-09: qty 50

## 2018-08-09 MED ORDER — DEXTROSE 50 % IV SOLN
1.0000 | Freq: Once | INTRAVENOUS | Status: AC
  Administered 2018-08-09: 50 mL via INTRAVENOUS
  Filled 2018-08-09: qty 50

## 2018-08-09 MED ORDER — DEXMEDETOMIDINE HCL IN NACL 400 MCG/100ML IV SOLN
0.4000 ug/kg/h | INTRAVENOUS | Status: DC
  Administered 2018-08-09: 0.4 ug/kg/h via INTRAVENOUS
  Administered 2018-08-09: 0.8 ug/kg/h via INTRAVENOUS
  Administered 2018-08-10: 0.6 ug/kg/h via INTRAVENOUS
  Administered 2018-08-10: 11:00:00 0.9 ug/kg/h via INTRAVENOUS
  Administered 2018-08-10: 0.6 ug/kg/h via INTRAVENOUS
  Administered 2018-08-11: 1.2 ug/kg/h via INTRAVENOUS
  Administered 2018-08-11: 1 ug/kg/h via INTRAVENOUS
  Filled 2018-08-09 (×8): qty 100

## 2018-08-09 MED ORDER — METRONIDAZOLE IN NACL 5-0.79 MG/ML-% IV SOLN
500.0000 mg | Freq: Three times a day (TID) | INTRAVENOUS | Status: DC
  Administered 2018-08-09 – 2018-08-10 (×4): 500 mg via INTRAVENOUS
  Filled 2018-08-09 (×7): qty 100

## 2018-08-09 NOTE — Progress Notes (Signed)
[facility]  ROUNDING NOTE   Subjective:   CRRT 0K bath. UF 63mL/hr Started on bicarb gtt Off vasopressors this morning  Shifted due to hyperkalemia  Alerted to self only  Objective:  Vital signs in last 24 hours:  Temp:  [96.5 F (35.8 C)-98 F (36.7 C)] 97.9 F (36.6 C) (06/17 0800) Pulse Rate:  [32-102] 74 (06/17 0900) Resp:  [12-21] 20 (06/17 0900) BP: (47-161)/(13-128) 131/94 (06/17 0900) SpO2:  [88 %-100 %] 98 % (06/17 0900) Weight:  [79.8 kg] 79.8 kg (06/17 0348)  Weight change: -2 kg Filed Weights   08/07/18 0334 08/08/18 0439 08/09/18 0348  Weight: 76.7 kg 81.8 kg 79.8 kg    Intake/Output: I/O last 3 completed shifts: In: 3688.9 [P.O.:360; I.V.:2992.5; IV Piggyback:336.3] Out: 316 [Other:316]   Intake/Output this shift:  Total I/O In: 654.9 [P.O.:120; I.V.:440.3; IV Piggyback:94.6] Out: 24 [Other:24]  Physical Exam: General: Critically ill  Head: Normocephalic, atraumatic. Dry oral mucosal membranes  Eyes: Anicteric  Neck: Supple, trachea midline  Lungs:  Clear to auscultation, normal effort  Heart:  irregular  Abdomen:  Soft, nontender, bowel sounds present  Extremities: + peripheral edema.  Neurologic: Alert to self  Skin: No lesions  Access: LUE AVF +bruit and thrill, RIJ temp HD catheter 6/16 Dr. Mortimer Fries    Basic Metabolic Panel: Recent Labs  Lab 08/08/18 1147 08/08/18 1157 08/08/18 1749 08/08/18 2235 08/09/18 0203 08/09/18 5397 08/09/18 0726 08/09/18 0822  NA 131*  --  131* 133* 132* 134*  --   --   K 7.5*  --  6.6* 6.8* 7.0* 4.3 5.6* 5.5*  CL 97*  --  97* 95* 94* 97*  --   --   CO2 12*  --  16* 14* 13* 17*  --   --   GLUCOSE 81  --  130* 115* 132* 131*  --   --   BUN 89*  --  79* 82* 69* 59*  --   --   CREATININE 9.05*  --  7.41* 7.79* 6.91* 5.53*  --   --   CALCIUM 8.4*  --  8.7* 8.9 8.8* 9.2  --   --   MG  --  2.1 2.1 2.2 2.1 1.7  --   --   PHOS 14.2*  --  11.4* 11.8* 11.2* 8.6*  --   --     Liver Function  Tests: Recent Labs  Lab 08/07/2018 0710 08/07/18 1513 08/08/18 1147 08/08/18 1749 08/08/18 2235 08/09/18 0203 08/09/18 0607  AST 26 51*  --   --   --   --   --   ALT 21 27  --   --   --   --   --   ALKPHOS 81 87  --   --   --   --   --   BILITOT 1.0 1.0  --   --   --   --   --   PROT 7.4 7.9  --   --   --   --   --   ALBUMIN 3.4* 3.4* 3.7 3.5 3.6 3.4* 3.3*   Recent Labs  Lab 08/07/18 1513  LIPASE 45   No results for input(s): AMMONIA in the last 168 hours.  CBC: Recent Labs  Lab 08/04/2018 0710 08/06/18 0045 08/07/18 2041 08/08/18 0957 08/09/18 0323  WBC 7.3 5.9  --  9.9 12.7*  NEUTROABS 4.5  --   --  7.8*  --   HGB 10.1* 10.4* 11.7* 11.5*  10.5*  HCT 31.6* 32.9*  --  37.6* 34.0*  MCV 87.8 88.0  --  89.7 88.8  PLT 150 114*  --  108* 95*    Cardiac Enzymes: Recent Labs  Lab 08/06/18 0045 08/06/18 0303 08/07/18 1855 08/08/18 0115 08/08/18 0957  TROPONINI 0.18* 0.22* 2.05* 1.92* 1.73*    BNP: Invalid input(s): POCBNP  CBG: Recent Labs  Lab 08/08/18 1201 08/08/18 1546 08/08/18 1940 08/08/18 2353 08/09/18 0736  GLUCAP 68* 102* 94 91 109*    Microbiology: Results for orders placed or performed during the hospital encounter of 08/04/2018  Blood culture (routine x 2)     Status: None (Preliminary result)   Collection Time: 08/09/2018  7:46 AM   Specimen: BLOOD  Result Value Ref Range Status   Specimen Description BLOOD RFA  Final   Special Requests   Final    BOTTLES DRAWN AEROBIC AND ANAEROBIC Blood Culture results may not be optimal due to an excessive volume of blood received in culture bottles   Culture   Final    NO GROWTH 4 DAYS Performed at [facility], [address] [place], [place] [address]    Report Status PENDING  Incomplete  Blood culture (routine x 2)     Status: None (Preliminary result)   Collection Time: 08/21/2018  7:47 AM   Specimen: BLOOD  Result Value Ref Range Status   Specimen Description BLOOD R AC  Final   Special  Requests   Final    BOTTLES DRAWN AEROBIC AND ANAEROBIC Blood Culture results may not be optimal due to an excessive volume of blood received in culture bottles   Culture   Final    NO GROWTH 4 DAYS Performed at [facility], [address], [place], [place] [address]    Report Status PENDING  Incomplete  SARS Coronavirus 2 (CEPHEID- Performed in [place] hospital lab), Hosp Order     Status: None   Collection Time: 08/16/2018  8:34 AM   Specimen: Nasopharyngeal Swab  Result Value Ref Range Status   SARS Coronavirus 2 NEGATIVE NEGATIVE Final    Comment: (NOTE) If result is NEGATIVE SARS-CoV-2 target nucleic acids are NOT DETECTED. The SARS-CoV-2 RNA is generally detectable in upper and lower  respiratory specimens during the acute phase of infection. The lowest  concentration of SARS-CoV-2 viral copies this assay can detect is 250  copies / mL. A negative result does not preclude SARS-CoV-2 infection  and should not be used as the sole basis for treatment or other  patient management decisions.  A negative result may occur with  improper specimen collection / handling, submission of specimen other  than nasopharyngeal swab, presence of viral mutation(s) within the  areas targeted by this assay, and inadequate number of viral copies  (<250 copies / mL). A negative result must be combined with clinical  observations, patient history, and epidemiological information. If result is POSITIVE SARS-CoV-2 target nucleic acids are DETECTED. The SARS-CoV-2 RNA is generally detectable in upper and lower  respiratory specimens dur ing the acute phase of infection.  Positive  results are indicative of active infection with SARS-CoV-2.  Clinical  correlation with patient history and other diagnostic information is  necessary to determine patient infection status.  Positive results do  not rule out bacterial infection or co-infection with other viruses. If result is PRESUMPTIVE  POSTIVE SARS-CoV-2 nucleic acids MAY BE PRESENT.   A presumptive positive result was obtained on the submitted specimen  and confirmed  on repeat testing.  While 2019 novel coronavirus  (SARS-CoV-2) nucleic acids may be present in the submitted sample  additional confirmatory testing may be necessary for epidemiological  and / or clinical management purposes  to differentiate between  SARS-CoV-2 and other Sarbecovirus currently known to infect humans.  If clinically indicated additional testing with an alternate test  methodology [phone]) is advised. The SARS-CoV-2 RNA is generally  detectable in upper and lower respiratory sp ecimens during the acute  phase of infection. The expected result is Negative. Fact Sheet for Patients:  StrictlyIdeas.no Fact Sheet for Healthcare Providers: BankingDealers.co.za This test is not yet approved or cleared by the [place] FDA and has been authorized for detection and/or diagnosis of SARS-CoV-2 by FDA under an Emergency Use Authorization (EUA).  This EUA will remain in effect (meaning this test can be used) for the duration of the COVID-19 declaration under Section 564(b)(1) of the Act, 21 U.S.C. section 360bbb-3(b)(1), unless the authorization is terminated or revoked sooner. Performed at [facility], [address] [place], [place] [address]   MRSA PCR Screening     Status: None   Collection Time: 08/09/2018  1:50 PM   Specimen: Nasal Mucosa; Nasopharyngeal  Result Value Ref Range Status   MRSA by PCR NEGATIVE NEGATIVE Final    Comment:        The GeneXpert MRSA Assay (FDA approved for NASAL specimens only), is one component of a comprehensive MRSA colonization surveillance program. It is not intended to diagnose MRSA infection nor to guide or monitor treatment for MRSA infections. Performed at [facility], [address] [place], [place] [address]   CULTURE,  BLOOD (ROUTINE X 2) w Reflex to ID Panel     Status: None (Preliminary result)   Collection Time: 08/09/18  3:24 AM   Specimen: BLOOD  Result Value Ref Range Status   Specimen Description BLOOD BLOOD RIGHT ARM  Final   Special Requests   Final    BOTTLES DRAWN AEROBIC AND ANAEROBIC Blood Culture adequate volume   Culture   Final    NO GROWTH < 12 HOURS Performed at [facility], [address] [place], [place] [address]    Report Status PENDING  Incomplete  CULTURE, BLOOD (ROUTINE X 2) w Reflex to ID Panel     Status: None (Preliminary result)   Collection Time: 08/09/18  3:41 AM   Specimen: BLOOD  Result Value Ref Range Status   Specimen Description BLOOD RIGHT ANTECUBITAL  Final   Special Requests   Final    BOTTLES DRAWN AEROBIC ONLY Blood Culture results may not be optimal due to an inadequate volume of blood received in culture bottles   Culture   Final    NO GROWTH < 12 HOURS Performed at [facility], [address] [place], [place] [address]    Report Status PENDING  Incomplete    Coagulation Studies: No results for input(s): LABPROT, INR in the last 72 hours.  Urinalysis: No results for input(s): COLORURINE, LABSPEC, PHURINE, GLUCOSEU, HGBUR, BILIRUBINUR, KETONESUR, PROTEINUR, UROBILINOGEN, NITRITE, LEUKOCYTESUR in the last 72 hours.  Invalid input(s): APPERANCEUR    Imaging: Dg Shoulder 1v Right  Result Date: 08/09/2018 CLINICAL DATA:  Right shoulder pain. EXAM: RIGHT SHOULDER - 1 VIEW COMPARISON:  None. FINDINGS: Single AP view of the shoulder. No fracture. Alignment is maintained. Mild acromioclavicular spurring. No bony destructive change or periosteal reaction. Soft tissues are unremarkable. IMPRESSION: Single portable AP view the shoulder demonstrating mild  acromioclavicular degenerative change. No other explanation for pain. Electronically Signed   By: Keith Rake M.D.   On: 08/09/2018 03:52   Dg Abd 1 View  Result Date:  08/09/2018 CLINICAL DATA:  Abdominal discomfort and worsening lactic acidosis. EXAM: ABDOMEN - 1 VIEW COMPARISON:  Abdominal CT 2 days ago. FINDINGS: No bowel dilatation to suggest obstruction. Small bowel loops in the left abdomen with possible wall thickening. High-density material in the colon is seen on prior. No free air. Vascular coils in the central abdomen. There are vascular calcifications. IMPRESSION: Suspect wall thickening of small bowel loops in the left lower abdomen, possible enteritis. No obstruction or free air. Electronically Signed   By: Keith Rake M.D.   On: 08/09/2018 03:51   Ct Head Wo Contrast  Result Date: 08/07/2018 CLINICAL DATA:  Altered mental status EXAM: CT HEAD WITHOUT CONTRAST TECHNIQUE: Contiguous axial images were obtained from the base of the skull through the vertex without intravenous contrast. COMPARISON:  05/04/2018 FINDINGS: Brain: No evidence of acute infarction, hemorrhage, hydrocephalus, extra-axial collection or mass lesion/mass effect. Vascular: No hyperdense vessel or unexpected calcification. Skull: Normal. Negative for fracture or focal lesion. Sinuses/Orbits: Small, partially imaged air-fluid level of the left maxillary sinus. Other: Contrast opacification of the vascular structures and meninges, likely due to persistent circulating contrast in the setting of renal failure and hemodialysis. IMPRESSION: 1.  No acute intracranial pathology. 2. Small, partially imaged air-fluid level of the left maxillary sinus. Correlate for evidence of sinusitis. 3. Contrast opacification of the vascular structures and meninges, likely due to persistent circulating contrast in the setting of renal failure and hemodialysis. Electronically Signed   By: Eddie Candle M.D.   On: 08/07/2018 16:31   Ct Chest W Contrast  Result Date: 08/07/2018 CLINICAL DATA:  Chest pain and shortness of breath, abdominal pain and distension today. EXAM: CT CHEST, ABDOMEN, AND PELVIS WITH  CONTRAST TECHNIQUE: Multidetector CT imaging of the chest, abdomen and pelvis was performed following the standard protocol during bolus administration of intravenous contrast. CONTRAST:  141mL OMNIPAQUE IOHEXOL 300 MG/ML  SOLN COMPARISON:  Chest CT 08/02/2018 and abdominal CT scan 05/04/2018 FINDINGS: CT CHEST FINDINGS Cardiovascular: The heart is enlarged but stable. No pericardial effusion. Stable tortuosity, ectasia and heavy calcification of the thoracic aorta. Stable three-vessel coronary artery calcifications. Stable left brachiocephalic vein stent. Enlarged pulmonary arteries consistent with pulmonary hypertension. Mediastinum/Nodes: Stable scattered mediastinal and hilar lymph nodes but no mass or overt adenopathy. The esophagus is grossly normal. Lungs/Pleura: Small to moderate-sized right effusion and small diffusion both larger when compared to prior recent chest CT. There is also overlying atelectasis. No overt pulmonary edema, focal pulmonary infiltrates or worrisome pulmonary lesions. Musculoskeletal: No chest wall masses identified. There is diffuse body wall edema noted suggesting anasarca. No supraclavicular or axillary lymphadenopathy the. The bony structures are unremarkable and appears stable. CT ABDOMEN PELVIS FINDINGS Hepatobiliary: Moderate reflux of contrast down the IVC and into the hepatic veins suggesting right heart failure or tricuspid regurgitation. Liver demonstrates changes suggesting hepatic congestion. No worrisome hepatic lesions or intrahepatic biliary dilatation. Contrast noted in the gallbladder related to the recent chest CT with contrast. Pancreas: No mass, inflammation or ductal dilatation. Spleen: Normal size.  No focal lesions. Adrenals/Urinary Tract: The adrenal glands are unremarkable and stable. Small kidneys with extensive arterial calcifications. No worrisome renal lesions. The bladder is grossly normal. Stomach/Bowel: The stomach, duodenum, small bowel and colon are  grossly normal. No obvious acute inflammatory process, mass lesion or  obstructive findings. Vascular/Lymphatic: Heavy atherosclerotic calcifications involving the aorta and branch vessels, particularly the renal arteries. Stable scattered mesenteric and retroperitoneal lymph nodes but no mass or overt adenopathy. Reproductive: The prostate gland and seminal vesicles are unremarkable. Other: Diffuse mesenteric edema and small volume abdominal/pelvic ascites. The pelvic fluid demonstrates higher attenuation which could suggest hemorrhage or peritonitis. No obvious active extravasation of contrast material. Coils are noted in the region of the gastroduodenal artery from previous embolization procedure. Musculoskeletal: Severe/aggressive degenerative changes involving the lumbar spine. Amyloid spondyloarthropathy would certainly be a consideration given the patient's history of renal dialysis. IMPRESSION: 1. Enlarging bilateral pleural effusions since the prior CT scan from 2 days ago. Moderate on the right and small the left with overlying atelectasis. 2. Stable cardiac enlargement and vascular congestion but no overt pulmonary edema. 3. Reflux of contrast down the IVC consistent with right heart failure or tricuspid regurgitation. Changes of hepatic congestion are noted. 4. Diffuse body wall edema, mesenteric edema and abdominal/pelvic ascites. The ascites in the abdomen/pelvis is high in attenuation suggesting complex fluid or hemorrhage. No obvious extravasating contrast is identified. 5. Extensive vascular calcifications. 6. Severe and aggressive degenerative changes involving the lumbar spine. Amyloid spondyloarthropathy would be a consideration. Electronically Signed   By: Marijo Sanes M.D.   On: 08/07/2018 19:37   Ct Abdomen Pelvis W Contrast  Result Date: 08/07/2018 CLINICAL DATA:  Chest pain and shortness of breath, abdominal pain and distension today. EXAM: CT CHEST, ABDOMEN, AND PELVIS WITH CONTRAST  TECHNIQUE: Multidetector CT imaging of the chest, abdomen and pelvis was performed following the standard protocol during bolus administration of intravenous contrast. CONTRAST:  12mL OMNIPAQUE IOHEXOL 300 MG/ML  SOLN COMPARISON:  Chest CT 08/22/2018 and abdominal CT scan 05/04/2018 FINDINGS: CT CHEST FINDINGS Cardiovascular: The heart is enlarged but stable. No pericardial effusion. Stable tortuosity, ectasia and heavy calcification of the thoracic aorta. Stable three-vessel coronary artery calcifications. Stable left brachiocephalic vein stent. Enlarged pulmonary arteries consistent with pulmonary hypertension. Mediastinum/Nodes: Stable scattered mediastinal and hilar lymph nodes but no mass or overt adenopathy. The esophagus is grossly normal. Lungs/Pleura: Small to moderate-sized right effusion and small diffusion both larger when compared to prior recent chest CT. There is also overlying atelectasis. No overt pulmonary edema, focal pulmonary infiltrates or worrisome pulmonary lesions. Musculoskeletal: No chest wall masses identified. There is diffuse body wall edema noted suggesting anasarca. No supraclavicular or axillary lymphadenopathy the. The bony structures are unremarkable and appears stable. CT ABDOMEN PELVIS FINDINGS Hepatobiliary: Moderate reflux of contrast down the IVC and into the hepatic veins suggesting right heart failure or tricuspid regurgitation. Liver demonstrates changes suggesting hepatic congestion. No worrisome hepatic lesions or intrahepatic biliary dilatation. Contrast noted in the gallbladder related to the recent chest CT with contrast. Pancreas: No mass, inflammation or ductal dilatation. Spleen: Normal size.  No focal lesions. Adrenals/Urinary Tract: The adrenal glands are unremarkable and stable. Small kidneys with extensive arterial calcifications. No worrisome renal lesions. The bladder is grossly normal. Stomach/Bowel: The stomach, duodenum, small bowel and colon are grossly  normal. No obvious acute inflammatory process, mass lesion or obstructive findings. Vascular/Lymphatic: Heavy atherosclerotic calcifications involving the aorta and branch vessels, particularly the renal arteries. Stable scattered mesenteric and retroperitoneal lymph nodes but no mass or overt adenopathy. Reproductive: The prostate gland and seminal vesicles are unremarkable. Other: Diffuse mesenteric edema and small volume abdominal/pelvic ascites. The pelvic fluid demonstrates higher attenuation which could suggest hemorrhage or peritonitis. No obvious active extravasation of contrast material. Coils are  noted in the region of the gastroduodenal artery from previous embolization procedure. Musculoskeletal: Severe/aggressive degenerative changes involving the lumbar spine. Amyloid spondyloarthropathy would certainly be a consideration given the patient's history of renal dialysis. IMPRESSION: 1. Enlarging bilateral pleural effusions since the prior CT scan from 2 days ago. Moderate on the right and small the left with overlying atelectasis. 2. Stable cardiac enlargement and vascular congestion but no overt pulmonary edema. 3. Reflux of contrast down the IVC consistent with right heart failure or tricuspid regurgitation. Changes of hepatic congestion are noted. 4. Diffuse body wall edema, mesenteric edema and abdominal/pelvic ascites. The ascites in the abdomen/pelvis is high in attenuation suggesting complex fluid or hemorrhage. No obvious extravasating contrast is identified. 5. Extensive vascular calcifications. 6. Severe and aggressive degenerative changes involving the lumbar spine. Amyloid spondyloarthropathy would be a consideration. Electronically Signed   By: Marijo Sanes M.D.   On: 08/07/2018 19:37   Dg Chest Port 1 View  Result Date: 08/09/2018 CLINICAL DATA:  Acute respiratory failure. EXAM: PORTABLE CHEST 1 VIEW COMPARISON:  Radiograph yesterday. FINDINGS: Tip of the right central line in the lower  SVC. Vascular stent in the region of brachiocephalic unchanged cardiomegaly. Small bilateral pleural effusions, similar to prior. Improved right lung base atelectasis. No acute airspace disease. No pneumothorax. IMPRESSION: 1. Unchanged small bilateral pleural effusions. Improved right basilar atelectasis. 2. Stable cardiomegaly. Electronically Signed   By: Keith Rake M.D.   On: 08/09/2018 03:49   Dg Chest Port 1 View  Result Date: 08/08/2018 CLINICAL DATA:  Central line placement EXAM: PORTABLE CHEST 1 VIEW COMPARISON:  08/11/2018 FINDINGS: The patient is status post placement of a right-sided central venous catheter. The tip is well position near the cavoatrial junction. The heart size remains enlarged. Aortic calcifications are noted. A vascular stent projects over the left brachiocephalic vein. There are small bilateral pleural effusions, left worse than right. There is an airspace opacity at the right lung base favored to represent atelectasis, however an infiltrate or aspiration is not excluded. There is no pneumothorax. IMPRESSION: 1. Well-positioned right-sided central venous catheter. No pneumothorax. 2. Cardiomegaly with persistent small bilateral pleural effusions and generalized volume overload. Electronically Signed   By: Constance Holster M.D.   On: 08/08/2018 16:41     Medications:   . sodium chloride    . ceFEPime (MAXIPIME) IV Stopped (08/08/18 2343)  . dexmedetomidine (PRECEDEX) IV infusion    . dextrose 75 mL/hr at 08/09/18 0914  . metronidazole Stopped (08/09/18 3086)  . norepinephrine (LEVOPHED) 16mg  / 24mL infusion Stopped (08/09/18 0004)  . phenylephrine (NEO-SYNEPHRINE) Adult infusion Stopped (08/08/18 1700)  . pureflow 2,000 mL/hr at 08/09/18 5784  . vasopressin (PITRESSIN) infusion - *FOR SHOCK* Stopped (08/09/18 0216)   . aspirin EC  81 mg Oral Daily  . atorvastatin  40 mg Oral Daily  . calcium acetate  2,001 mg Oral TID WC  . cinacalcet  30 mg Oral 3 times  weekly  . docusate sodium  100 mg Oral Daily  . hydrocortisone sod succinate (SOLU-CORTEF) inj  50 mg Intravenous Q6H  . mouth rinse  15 mL Mouth Rinse BID  . midodrine  10 mg Oral TID WC  . pantoprazole (PROTONIX) IV  40 mg Intravenous Q12H  . sodium chloride flush  3 mL Intravenous Q12H   sodium chloride, acetaminophen **OR** acetaminophen, diphenhydrAMINE, guaiFENesin, heparin, hydrOXYzine, ipratropium-albuterol, morphine injection, nitroGLYCERIN, ondansetron **OR** ondansetron (ZOFRAN) IV, oxyCODONE-acetaminophen, senna-docusate, sodium chloride flush  Assessment/ Plan:  63 y.o. male  Mr.  [name] is a 63 y.o. black male with end stage renal disease on hemodialysis, hypertension, hepatitis C, anemia, seizure disorder, COPD/tobacco abuse,substance abuse (cocaine) and alcohol abuse,secondary hyperparathyroidism, chronic systolic congestive heart failure admitted with chest pain and atrial flutter. Complicated by bradycardia. Transferred to ICU on 6/15 for worsening status, hypoglycemia, hypotension, sepsis and hyperkalemia.   [facility] MWF [address]Left arm AVG. 240 minutes 72kg  1.  ESRD on HD MWF.  With hyponatremia, hyperkalemia and metabolic acidosis Required norepinephrine and vasopressin until this morning. Not hemodynamically stable for intermittent hemodialysis treatment.  - Discontinue bicarb gtt - Continue CVVHD 0K bath - Will try to increase BFR and UF as tolerated.   2.  Anemia of chronic kidney disease.  Hemoglobin 10.5 Holding EPO due to concerns of dissection/ischemia.   3.  Secondary hyperparathyroidism with hyperphophatemia. PTH elevated at 1178 on 07/31/2018 - home regimen of calcium acetate. Takes cinacalcet on dialysis days.    4.  Shock: cardiogenic with Chest pain/atrial flutter.  With demand ischemia. However also with elevated lactic acid level and procalcitonin levels. Concern for underlying sepsis.  - Empiric cefepime and metronidazole.  - Weaned  off vasopressors.   5. Diabetes mellitus type II with chronic kidney disease: with prolonged hypoglycemia - D10W infusion. - q4 hour glucose checks.    LOS: 4 Roslyn Else 6/17/20209:35 AM

## 2018-08-09 NOTE — Progress Notes (Signed)
Progress Note  Patient Name: [name] Date of Encounter: 08/09/2018  Primary Cardiologist: Nelva Bush, MD   Subjective    lethargy this morning, chronic expiratory moan  Denies chest pain Telemetry reviewed showing normal sinus rhythm Telemetry yesterday with what appeared to be atrial tachycardia around noon rate 130 bpm with a bundle Broke to what appeared to be accelerated junctional finally converting to normal sinus rhythm  Inpatient Medications    Scheduled Meds:  aspirin EC  81 mg Oral Daily   atorvastatin  40 mg Oral Daily   calcium acetate  2,001 mg Oral TID WC   cinacalcet  30 mg Oral 3 times weekly   docusate sodium  100 mg Oral Daily   hydrocortisone sod succinate (SOLU-CORTEF) inj  50 mg Intravenous Q6H   mouth rinse  15 mL Mouth Rinse BID   midodrine  10 mg Oral TID WC   pantoprazole (PROTONIX) IV  40 mg Intravenous Q12H   sodium chloride flush  3 mL Intravenous Q12H   Continuous Infusions:  sodium chloride     ceFEPime (MAXIPIME) IV Stopped (08/08/18 2343)   dexmedetomidine (PRECEDEX) IV infusion     dextrose 75 mL/hr at 08/09/18 0914   metronidazole Stopped (08/09/18 0643)   norepinephrine (LEVOPHED) 16mg  / 264mL infusion Stopped (08/09/18 0004)   phenylephrine (NEO-SYNEPHRINE) Adult infusion Stopped (08/08/18 1700)   pureflow 2,000 mL/hr at 08/09/18 0627   vasopressin (PITRESSIN) infusion - *FOR SHOCK* Stopped (08/09/18 0216)   PRN Meds: sodium chloride, acetaminophen **OR** acetaminophen, diphenhydrAMINE, guaiFENesin, heparin, hydrOXYzine, ipratropium-albuterol, morphine injection, nitroGLYCERIN, ondansetron **OR** ondansetron (ZOFRAN) IV, oxyCODONE-acetaminophen, senna-docusate, sodium chloride flush   Vital Signs    Vitals:   08/09/18 0700 08/09/18 0800 08/09/18 0900 08/09/18 1000  BP: 111/74 121/69 (!) 131/94 125/75  Pulse: 78 75 74 71  Resp: 13 14 20 12   Temp:  97.9 F (36.6 C)    TempSrc:  Axillary    SpO2:  100% 100% 98% 99%  Weight:      Height:        Intake/Output Summary (Last 24 hours) at 08/09/2018 1035 Last data filed at 08/09/2018 1000 Gross per 24 hour  Intake 2981.81 ml  Output 389 ml  Net 2592.81 ml   Last 3 Weights 08/09/2018 08/08/2018 08/07/2018  Weight (lbs) 175 lb 14.8 oz 180 lb 5.4 oz 169 lb 1.6 oz  Weight (kg) 79.8 kg 81.8 kg 76.703 kg      Telemetry    Sinus rhythm -Personally Reviewed  ECG      Physical Exam   GEN:  No significant distress Neck: No obvious JVD. Cardiac: Bradycardic and irregular.  No murmurs. Respiratory: Bilateral rhochi.  No wheezes or crackles. GI: Soft, nontender, non-distended  MS: No edema; No deformity. Neuro:  Nonfocal  Psych: Somewhat agitated.  Labs    Chemistry Recent Labs  Lab 08/14/2018 0710  08/07/18 1513  08/09/18 0203 08/09/18 3154 08/09/18 0726 08/09/18 0822 08/09/18 1002  NA 141   < >  --    < > 132* 134*  --   --  135  K 4.4   < >  --    < > 7.0* 4.3 5.6* 5.5* 4.7  CL 105   < >  --    < > 94* 97*  --   --  95*  CO2 23   < >  --    < > 13* 17*  --   --  20*  GLUCOSE 78   < >  --    < >  132* 131*  --   --  140*  BUN 45*   < >  --    < > 69* 59*  --   --  57*  CREATININE 5.27*   < >  --    < > 6.91* 5.53*  --   --  5.36*  CALCIUM 8.3*   < >  --    < > 8.8* 9.2  --   --  9.5  PROT 7.4  --  7.9  --   --   --   --   --   --   ALBUMIN 3.4*  --  3.4*   < > 3.4* 3.3*  --   --  3.4*  AST 26  --  51*  --   --   --   --   --   --   ALT 21  --  27  --   --   --   --   --   --   ALKPHOS 81  --  87  --   --   --   --   --   --   BILITOT 1.0  --  1.0  --   --   --   --   --   --   GFRNONAA 11*   < >  --    < > 8* 10*  --   --  10*  GFRAA 12*   < >  --    < > 9* 12*  --   --  12*  ANIONGAP 13   < >  --    < > 25* 20*  --   --  20*   < > = values in this interval not displayed.     Hematology Recent Labs  Lab 08/06/18 0045 08/07/18 2041 08/08/18 0957 08/09/18 0323  WBC 5.9  --  9.9 12.7*  RBC 3.74*  --  4.19*  3.83*  HGB 10.4* 11.7* 11.5* 10.5*  HCT 32.9*  --  37.6* 34.0*  MCV 88.0  --  89.7 88.8  MCH 27.8  --  27.4 27.4  MCHC 31.6  --  30.6 30.9  RDW 19.5*  --  19.7* 19.3*  PLT 114*  --  108* 95*    Cardiac Enzymes Recent Labs  Lab 08/06/18 0303 08/07/18 1855 08/08/18 0115 08/08/18 0957  TROPONINI 0.22* 2.05* 1.92* 1.73*   No results for input(s): TROPIPOC in the last 168 hours.   BNP Recent Labs  Lab 07/26/2018 0710  BNP 2,373.0*     DDimer No results for input(s): DDIMER in the last 168 hours.   Radiology    Dg Shoulder 1v Right  Result Date: 08/09/2018 CLINICAL DATA:  Right shoulder pain. EXAM: RIGHT SHOULDER - 1 VIEW COMPARISON:  None. FINDINGS: Single AP view of the shoulder. No fracture. Alignment is maintained. Mild acromioclavicular spurring. No bony destructive change or periosteal reaction. Soft tissues are unremarkable. IMPRESSION: Single portable AP view the shoulder demonstrating mild acromioclavicular degenerative change. No other explanation for pain. Electronically Signed   By: Keith Rake M.D.   On: 08/09/2018 03:52   Dg Abd 1 View  Result Date: 08/09/2018 CLINICAL DATA:  Abdominal discomfort and worsening lactic acidosis. EXAM: ABDOMEN - 1 VIEW COMPARISON:  Abdominal CT 2 days ago. FINDINGS: No bowel dilatation to suggest obstruction. Small bowel loops in the left abdomen with possible wall thickening. High-density material in the colon is seen on prior. No free air.  Vascular coils in the central abdomen. There are vascular calcifications. IMPRESSION: Suspect wall thickening of small bowel loops in the left lower abdomen, possible enteritis. No obstruction or free air. Electronically Signed   By: Keith Rake M.D.   On: 08/09/2018 03:51   Ct Head Wo Contrast  Result Date: 08/07/2018 CLINICAL DATA:  Altered mental status EXAM: CT HEAD WITHOUT CONTRAST TECHNIQUE: Contiguous axial images were obtained from the base of the skull through the vertex without  intravenous contrast. COMPARISON:  05/04/2018 FINDINGS: Brain: No evidence of acute infarction, hemorrhage, hydrocephalus, extra-axial collection or mass lesion/mass effect. Vascular: No hyperdense vessel or unexpected calcification. Skull: Normal. Negative for fracture or focal lesion. Sinuses/Orbits: Small, partially imaged air-fluid level of the left maxillary sinus. Other: Contrast opacification of the vascular structures and meninges, likely due to persistent circulating contrast in the setting of renal failure and hemodialysis. IMPRESSION: 1.  No acute intracranial pathology. 2. Small, partially imaged air-fluid level of the left maxillary sinus. Correlate for evidence of sinusitis. 3. Contrast opacification of the vascular structures and meninges, likely due to persistent circulating contrast in the setting of renal failure and hemodialysis. Electronically Signed   By: Eddie Candle M.D.   On: 08/07/2018 16:31   Ct Chest W Contrast  Result Date: 08/07/2018 CLINICAL DATA:  Chest pain and shortness of breath, abdominal pain and distension today. EXAM: CT CHEST, ABDOMEN, AND PELVIS WITH CONTRAST TECHNIQUE: Multidetector CT imaging of the chest, abdomen and pelvis was performed following the standard protocol during bolus administration of intravenous contrast. CONTRAST:  121mL OMNIPAQUE IOHEXOL 300 MG/ML  SOLN COMPARISON:  Chest CT 08/20/2018 and abdominal CT scan 05/04/2018 FINDINGS: CT CHEST FINDINGS Cardiovascular: The heart is enlarged but stable. No pericardial effusion. Stable tortuosity, ectasia and heavy calcification of the thoracic aorta. Stable three-vessel coronary artery calcifications. Stable left brachiocephalic vein stent. Enlarged pulmonary arteries consistent with pulmonary hypertension. Mediastinum/Nodes: Stable scattered mediastinal and hilar lymph nodes but no mass or overt adenopathy. The esophagus is grossly normal. Lungs/Pleura: Small to moderate-sized right effusion and small  diffusion both larger when compared to prior recent chest CT. There is also overlying atelectasis. No overt pulmonary edema, focal pulmonary infiltrates or worrisome pulmonary lesions. Musculoskeletal: No chest wall masses identified. There is diffuse body wall edema noted suggesting anasarca. No supraclavicular or axillary lymphadenopathy the. The bony structures are unremarkable and appears stable. CT ABDOMEN PELVIS FINDINGS Hepatobiliary: Moderate reflux of contrast down the IVC and into the hepatic veins suggesting right heart failure or tricuspid regurgitation. Liver demonstrates changes suggesting hepatic congestion. No worrisome hepatic lesions or intrahepatic biliary dilatation. Contrast noted in the gallbladder related to the recent chest CT with contrast. Pancreas: No mass, inflammation or ductal dilatation. Spleen: Normal size.  No focal lesions. Adrenals/Urinary Tract: The adrenal glands are unremarkable and stable. Small kidneys with extensive arterial calcifications. No worrisome renal lesions. The bladder is grossly normal. Stomach/Bowel: The stomach, duodenum, small bowel and colon are grossly normal. No obvious acute inflammatory process, mass lesion or obstructive findings. Vascular/Lymphatic: Heavy atherosclerotic calcifications involving the aorta and branch vessels, particularly the renal arteries. Stable scattered mesenteric and retroperitoneal lymph nodes but no mass or overt adenopathy. Reproductive: The prostate gland and seminal vesicles are unremarkable. Other: Diffuse mesenteric edema and small volume abdominal/pelvic ascites. The pelvic fluid demonstrates higher attenuation which could suggest hemorrhage or peritonitis. No obvious active extravasation of contrast material. Coils are noted in the region of the gastroduodenal artery from previous embolization procedure. Musculoskeletal: Severe/aggressive degenerative changes  involving the lumbar spine. Amyloid spondyloarthropathy would  certainly be a consideration given the patient's history of renal dialysis. IMPRESSION: 1. Enlarging bilateral pleural effusions since the prior CT scan from 2 days ago. Moderate on the right and small the left with overlying atelectasis. 2. Stable cardiac enlargement and vascular congestion but no overt pulmonary edema. 3. Reflux of contrast down the IVC consistent with right heart failure or tricuspid regurgitation. Changes of hepatic congestion are noted. 4. Diffuse body wall edema, mesenteric edema and abdominal/pelvic ascites. The ascites in the abdomen/pelvis is high in attenuation suggesting complex fluid or hemorrhage. No obvious extravasating contrast is identified. 5. Extensive vascular calcifications. 6. Severe and aggressive degenerative changes involving the lumbar spine. Amyloid spondyloarthropathy would be a consideration. Electronically Signed   By: Marijo Sanes M.D.   On: 08/07/2018 19:37   Ct Abdomen Pelvis W Contrast  Result Date: 08/07/2018 CLINICAL DATA:  Chest pain and shortness of breath, abdominal pain and distension today. EXAM: CT CHEST, ABDOMEN, AND PELVIS WITH CONTRAST TECHNIQUE: Multidetector CT imaging of the chest, abdomen and pelvis was performed following the standard protocol during bolus administration of intravenous contrast. CONTRAST:  110mL OMNIPAQUE IOHEXOL 300 MG/ML  SOLN COMPARISON:  Chest CT 07/31/2018 and abdominal CT scan 05/04/2018 FINDINGS: CT CHEST FINDINGS Cardiovascular: The heart is enlarged but stable. No pericardial effusion. Stable tortuosity, ectasia and heavy calcification of the thoracic aorta. Stable three-vessel coronary artery calcifications. Stable left brachiocephalic vein stent. Enlarged pulmonary arteries consistent with pulmonary hypertension. Mediastinum/Nodes: Stable scattered mediastinal and hilar lymph nodes but no mass or overt adenopathy. The esophagus is grossly normal. Lungs/Pleura: Small to moderate-sized right effusion and small diffusion  both larger when compared to prior recent chest CT. There is also overlying atelectasis. No overt pulmonary edema, focal pulmonary infiltrates or worrisome pulmonary lesions. Musculoskeletal: No chest wall masses identified. There is diffuse body wall edema noted suggesting anasarca. No supraclavicular or axillary lymphadenopathy the. The bony structures are unremarkable and appears stable. CT ABDOMEN PELVIS FINDINGS Hepatobiliary: Moderate reflux of contrast down the IVC and into the hepatic veins suggesting right heart failure or tricuspid regurgitation. Liver demonstrates changes suggesting hepatic congestion. No worrisome hepatic lesions or intrahepatic biliary dilatation. Contrast noted in the gallbladder related to the recent chest CT with contrast. Pancreas: No mass, inflammation or ductal dilatation. Spleen: Normal size.  No focal lesions. Adrenals/Urinary Tract: The adrenal glands are unremarkable and stable. Small kidneys with extensive arterial calcifications. No worrisome renal lesions. The bladder is grossly normal. Stomach/Bowel: The stomach, duodenum, small bowel and colon are grossly normal. No obvious acute inflammatory process, mass lesion or obstructive findings. Vascular/Lymphatic: Heavy atherosclerotic calcifications involving the aorta and branch vessels, particularly the renal arteries. Stable scattered mesenteric and retroperitoneal lymph nodes but no mass or overt adenopathy. Reproductive: The prostate gland and seminal vesicles are unremarkable. Other: Diffuse mesenteric edema and small volume abdominal/pelvic ascites. The pelvic fluid demonstrates higher attenuation which could suggest hemorrhage or peritonitis. No obvious active extravasation of contrast material. Coils are noted in the region of the gastroduodenal artery from previous embolization procedure. Musculoskeletal: Severe/aggressive degenerative changes involving the lumbar spine. Amyloid spondyloarthropathy would certainly be  a consideration given the patient's history of renal dialysis. IMPRESSION: 1. Enlarging bilateral pleural effusions since the prior CT scan from 2 days ago. Moderate on the right and small the left with overlying atelectasis. 2. Stable cardiac enlargement and vascular congestion but no overt pulmonary edema. 3. Reflux of contrast down the IVC consistent with right heart  failure or tricuspid regurgitation. Changes of hepatic congestion are noted. 4. Diffuse body wall edema, mesenteric edema and abdominal/pelvic ascites. The ascites in the abdomen/pelvis is high in attenuation suggesting complex fluid or hemorrhage. No obvious extravasating contrast is identified. 5. Extensive vascular calcifications. 6. Severe and aggressive degenerative changes involving the lumbar spine. Amyloid spondyloarthropathy would be a consideration. Electronically Signed   By: Marijo Sanes M.D.   On: 08/07/2018 19:37   Dg Chest Port 1 View  Result Date: 08/09/2018 CLINICAL DATA:  Acute respiratory failure. EXAM: PORTABLE CHEST 1 VIEW COMPARISON:  Radiograph yesterday. FINDINGS: Tip of the right central line in the lower SVC. Vascular stent in the region of brachiocephalic unchanged cardiomegaly. Small bilateral pleural effusions, similar to prior. Improved right lung base atelectasis. No acute airspace disease. No pneumothorax. IMPRESSION: 1. Unchanged small bilateral pleural effusions. Improved right basilar atelectasis. 2. Stable cardiomegaly. Electronically Signed   By: Keith Rake M.D.   On: 08/09/2018 03:49   Dg Chest Port 1 View  Result Date: 08/08/2018 CLINICAL DATA:  Central line placement EXAM: PORTABLE CHEST 1 VIEW COMPARISON:  08/20/2018 FINDINGS: The patient is status post placement of a right-sided central venous catheter. The tip is well position near the cavoatrial junction. The heart size remains enlarged. Aortic calcifications are noted. A vascular stent projects over the left brachiocephalic vein. There are  small bilateral pleural effusions, left worse than right. There is an airspace opacity at the right lung base favored to represent atelectasis, however an infiltrate or aspiration is not excluded. There is no pneumothorax. IMPRESSION: 1. Well-positioned right-sided central venous catheter. No pneumothorax. 2. Cardiomegaly with persistent small bilateral pleural effusions and generalized volume overload. Electronically Signed   By: Constance Holster M.D.   On: 08/08/2018 16:41    Cardiac Studies   Echocardiogram (08/06/2018):  1. The left ventricle has normal systolic function, with an ejection fraction of 55%. The cavity size was normal. There is mildly increased left ventricular wall thickness.  2. The right ventricle has moderately reduced systolic function. The cavity was moderately enlarged. There is no increase in right ventricular wall thickness. Right ventricular systolic pressure is mild to moderately elevated with an estimated pressure  of 43.6 mmHg.  3. Left atrial size was moderately dilated.  4. Right atrial size was moderately dilated.  5. Mitral valve regurgitation is moderate The MR jet is eccentric posteriorly directed.Unable to exclude mild prolapse of the anterior leaflet.  6. Tricuspid valve regurgitation is severe.  7. Pulmonic valve regurgitation is mild to moderate  8. Rhythm is atrial flutter  Patient Profile     63 y.o. male with a history of ischemic and nonischemic cardiomyopathy, CAD, chronic combined CHF, end-stage renal disease on hemodialysis, frequent PVCs/bigeminy, hepatitis C, GI bleed, anemia of chronic disease, pulmonary hypertension, hypertensive heart disease, PAD and presenting with chest pain, cough, and hypertension.  Assessment & Plan    Demand ischemia: Patient denies chest pain.  Troponin  2.1  Known coronary artery disease, evaded in the setting of tachycardia, flutter on arrival   EKG without acute ischemic changes.   Echocardiogram  with  preserved LVEF and no obvious wall motion abnormalities. -No plan for heart catheterization -History of GI bleed, will need to proceed with antiplatelet therapy cautiously  Atrial aflutter/fibrillation: Back in normal sinus rhythm Yesterday with what appears to be atrial tachycardia rate in the 130s Now maintaining normal sinus rhythm rate in the 70s --Avoid AV-nodal blocking and heart-rate suppressing agents, including beta-blockers, calcium-channel  blockers, and amiodarone. Defer long-term anticoagulation given history of GI bleeds.  Shock: Pressors as needed for blood pressure support On broad-spectrum antibiotics Infectious work-up  End-stage renal disease: Dialysis per nephrology.   Total encounter time more than 25 minutes  Greater than 50% was spent in counseling and coordination of care with the patient  For questions or updates, please contact [place] Please consult [email or web address] for contact info under [facility].     Signed, Ida Rogue, MD  08/09/2018, 10:35 AM

## 2018-08-09 NOTE — Procedures (Signed)
Central Venous Catheter Insertion Procedure Note [name] [id number] Aug 28, 1955  Procedure: Insertion of Central Venous Catheter Indications: Assessment of intravascular volume, Drug and/or fluid administration and Frequent blood sampling  Procedure Details Consent: Unable to obtain consent because of altered level of consciousness. Time Out: Verified patient identification, verified procedure, site/side was marked, verified correct patient position, special equipment/implants available, medications/allergies/relevent history reviewed, required imaging and test results available.  Performed  Maximum sterile technique was used including antiseptics, cap, gloves, gown, hand hygiene, mask and sheet. Skin prep: Chlorhexidine; local anesthetic administered A antimicrobial bonded/coated triple lumen catheter was placed in the right femoral vein due to patient being a dialysis patient using the Seldinger technique.  Evaluation Blood flow good Complications: No apparent complications Patient did tolerate procedure well. Chest X-ray ordered to verify placement.  CXR: Not needed, placed in right femoral vein.   Procedure was performed using Ultrasound for direct visualization of cannulization of Right Femoral Vein.     Darel Hong, AGACNP-BC [facility] Pager: [phone] Cell: [place] 08/09/2018, 10:42 PM

## 2018-08-09 NOTE — Progress Notes (Signed)
[place] [facility]                                                                                                                                                                                  Patient Demographics   [name], is a 63 y.o. male, DOB - 1955-10-20, NTI:[id number]  Admit date - 08/06/2018   Admitting Physician Dustin Flock, MD  Outpatient Primary MD for the patient is Aycock, Edmonia Lynch, MD   LOS - 4  Subjective: Patient more awake complains of aching in his back   Review of Systems:   CONSTITUTIONAL: Limited due to his mental status  Vitals:   Vitals:   08/09/18 0900 08/09/18 1000 08/09/18 1100 08/09/18 1200  BP: (!) 131/94 125/75 122/65 126/69  Pulse: 74 71 72 73  Resp: 20 12 12 14   Temp:    98.1 F (36.7 C)  TempSrc:    Axillary  SpO2: 98% 99% 100% 100%  Weight:      Height:        Wt Readings from Last 3 Encounters:  08/09/18 79.8 kg  05/09/18 78.3 kg  04/21/18 74.5 kg     Intake/Output Summary (Last 24 hours) at 08/09/2018 1313 Last data filed at 08/09/2018 1208 Gross per 24 hour  Intake 3281.28 ml  Output 437 ml  Net 2844.28 ml    Physical Exam:   GENERAL: Critically ill HEAD, EYES, EARS, NOSE AND THROAT: Atraumatic, normocephalic. Extraocular muscles are intact. Pupils equal and reactive to light. Sclerae anicteric. No conjunctival injection. No oro-pharyngeal erythema.  NECK: Supple. There is no jugular venous distention. No bruits, no lymphadenopathy, no thyromegaly.  HEART: Regular rate and rhythm,. No murmurs, no rubs, no clicks.  LUNGS: Clear to auscultation bilaterally. No rales or rhonchi. No wheezes.  ABDOMEN: Soft, flat, nontender, nondistended. Has good bowel sounds. No hepatosplenomegaly appreciated.  EXTREMITIES: No evidence of any cyanosis, clubbing, or peripheral edema.  +2 pedal and radial pulses bilaterally.  NEUROLOGIC: More awake sKIN: Moist and warm with no rashes appreciated.   Psych: Not anxious, depressed LN: No inguinal LN enlargement    Antibiotics   Anti-infectives (From admission, onward)   Start     Dose/Rate Route Frequency Ordered Stop   08/09/18 0515  metroNIDAZOLE (FLAGYL) IVPB 500 mg     500 mg 100 mL/hr over 60 Minutes Intravenous Every 8 hours 08/09/18 0506     08/08/18 2200  ceFEPIme (MAXIPIME) 2 g in sodium chloride 0.9 % 100 mL IVPB     2 g 200 mL/hr over 30 Minutes Intravenous Every 12 hours 08/08/18 1335     08/07/18 1200  ceFEPIme (MAXIPIME) 2  g in sodium chloride 0.9 % 100 mL IVPB  Status:  Discontinued     2 g 200 mL/hr over 30 Minutes Intravenous Every M-W-F (Hemodialysis) 08/21/2018 1250 08/08/18 1335   08/07/18 1200  vancomycin (VANCOCIN) IVPB 750 mg/150 ml premix  Status:  Discontinued     750 mg 150 mL/hr over 60 Minutes Intravenous Every M-W-F (Hemodialysis) 08/07/2018 1250 08/07/18 2155   08/04/2018 1415  vancomycin (VANCOCIN) IVPB 750 mg/150 ml premix     750 mg 150 mL/hr over 60 Minutes Intravenous  Once 08/07/2018 1250 07/27/2018 1600   08/03/2018 0730  ceFEPIme (MAXIPIME) 2 g in sodium chloride 0.9 % 100 mL IVPB     2 g 200 mL/hr over 30 Minutes Intravenous  Once 08/21/2018 0715 07/30/2018 0826   08/16/2018 0730  metroNIDAZOLE (FLAGYL) IVPB 500 mg     500 mg 100 mL/hr over 60 Minutes Intravenous  Once 08/10/2018 0715 07/24/2018 0908   08/04/2018 0730  vancomycin (VANCOCIN) IVPB 1000 mg/200 mL premix     1,000 mg 200 mL/hr over 60 Minutes Intravenous  Once 08/12/2018 0715 08/22/2018 0908      Medications   Scheduled Meds: . aspirin EC  81 mg Oral Daily  . atorvastatin  40 mg Oral Daily  . calcium acetate  2,001 mg Oral TID WC  . cinacalcet  30 mg Oral 3 times weekly  . docusate sodium  100 mg Oral Daily  . hydrocortisone sod succinate (SOLU-CORTEF) inj  50 mg Intravenous Q6H  . mouth rinse  15 mL Mouth Rinse BID  . midodrine  10 mg Oral TID WC  . pantoprazole (PROTONIX) IV  40 mg Intravenous Q12H  . sodium chloride flush  3 mL  Intravenous Q12H   Continuous Infusions: . sodium chloride    . ceFEPime (MAXIPIME) IV Stopped (08/09/18 1206)  . dexmedetomidine (PRECEDEX) IV infusion    . dextrose 40 mL/hr at 08/09/18 1138  . metronidazole Stopped (08/09/18 4098)  . norepinephrine (LEVOPHED) 16mg  / 267mL infusion Stopped (08/09/18 0004)  . phenylephrine (NEO-SYNEPHRINE) Adult infusion Stopped (08/08/18 1700)  . pureflow 2,000 mL/hr at 08/09/18 1191  . vasopressin (PITRESSIN) infusion - *FOR SHOCK* Stopped (08/09/18 0216)   PRN Meds:.sodium chloride, acetaminophen **OR** acetaminophen, diphenhydrAMINE, guaiFENesin, heparin, hydrOXYzine, ipratropium-albuterol, morphine injection, nitroGLYCERIN, ondansetron **OR** ondansetron (ZOFRAN) IV, oxyCODONE-acetaminophen, senna-docusate, sodium chloride flush   Data Review:   Micro Results Recent Results (from the past 240 hour(s))  Blood culture (routine x 2)     Status: None (Preliminary result)   Collection Time: 08/07/2018  7:46 AM   Specimen: BLOOD  Result Value Ref Range Status   Specimen Description BLOOD RFA  Final   Special Requests   Final    BOTTLES DRAWN AEROBIC AND ANAEROBIC Blood Culture results may not be optimal due to an excessive volume of blood received in culture bottles   Culture   Final    NO GROWTH 4 DAYS Performed at [facility], [address] [place], [place] [address]    Report Status PENDING  Incomplete  Blood culture (routine x 2)     Status: None (Preliminary result)   Collection Time: 07/25/2018  7:47 AM   Specimen: BLOOD  Result Value Ref Range Status   Specimen Description BLOOD R AC  Final   Special Requests   Final    BOTTLES DRAWN AEROBIC AND ANAEROBIC Blood Culture results may not be optimal due to an excessive volume of blood received in culture bottles   Culture  Final    NO GROWTH 4 DAYS Performed at [facility], [address] [place], [place] [address]    Report Status PENDING  Incomplete  SARS  Coronavirus 2 (CEPHEID- Performed in [place] hospital lab), Hosp Order     Status: None   Collection Time: 07/27/2018  8:34 AM   Specimen: Nasopharyngeal Swab  Result Value Ref Range Status   SARS Coronavirus 2 NEGATIVE NEGATIVE Final    Comment: (NOTE) If result is NEGATIVE SARS-CoV-2 target nucleic acids are NOT DETECTED. The SARS-CoV-2 RNA is generally detectable in upper and lower  respiratory specimens during the acute phase of infection. The lowest  concentration of SARS-CoV-2 viral copies this assay can detect is 250  copies / mL. A negative result does not preclude SARS-CoV-2 infection  and should not be used as the sole basis for treatment or other  patient management decisions.  A negative result may occur with  improper specimen collection / handling, submission of specimen other  than nasopharyngeal swab, presence of viral mutation(s) within the  areas targeted by this assay, and inadequate number of viral copies  (<250 copies / mL). A negative result must be combined with clinical  observations, patient history, and epidemiological information. If result is POSITIVE SARS-CoV-2 target nucleic acids are DETECTED. The SARS-CoV-2 RNA is generally detectable in upper and lower  respiratory specimens dur ing the acute phase of infection.  Positive  results are indicative of active infection with SARS-CoV-2.  Clinical  correlation with patient history and other diagnostic information is  necessary to determine patient infection status.  Positive results do  not rule out bacterial infection or co-infection with other viruses. If result is PRESUMPTIVE POSTIVE SARS-CoV-2 nucleic acids MAY BE PRESENT.   A presumptive positive result was obtained on the submitted specimen  and confirmed on repeat testing.  While 2019 novel coronavirus  (SARS-CoV-2) nucleic acids may be present in the submitted sample  additional confirmatory testing may be necessary for epidemiological  and / or  clinical management purposes  to differentiate between  SARS-CoV-2 and other Sarbecovirus currently known to infect humans.  If clinically indicated additional testing with an alternate test  methodology [phone]) is advised. The SARS-CoV-2 RNA is generally  detectable in upper and lower respiratory sp ecimens during the acute  phase of infection. The expected result is Negative. Fact Sheet for Patients:  StrictlyIdeas.no Fact Sheet for Healthcare Providers: BankingDealers.co.za This test is not yet approved or cleared by the [place] FDA and has been authorized for detection and/or diagnosis of SARS-CoV-2 by FDA under an Emergency Use Authorization (EUA).  This EUA will remain in effect (meaning this test can be used) for the duration of the COVID-19 declaration under Section 564(b)(1) of the Act, 21 U.S.C. section 360bbb-3(b)(1), unless the authorization is terminated or revoked sooner. Performed at [facility], [address] [place], [place] [address]   MRSA PCR Screening     Status: None   Collection Time: 08/02/2018  1:50 PM   Specimen: Nasal Mucosa; Nasopharyngeal  Result Value Ref Range Status   MRSA by PCR NEGATIVE NEGATIVE Final    Comment:        The GeneXpert MRSA Assay (FDA approved for NASAL specimens only), is one component of a comprehensive MRSA colonization surveillance program. It is not intended to diagnose MRSA infection nor to guide or monitor treatment for MRSA infections. Performed at [facility], [address] [place], [place] [address]   CULTURE,  BLOOD (ROUTINE X 2) w Reflex to ID Panel     Status: None (Preliminary result)   Collection Time: 08/09/18  3:24 AM   Specimen: BLOOD  Result Value Ref Range Status   Specimen Description BLOOD BLOOD RIGHT ARM  Final   Special Requests   Final    BOTTLES DRAWN AEROBIC AND ANAEROBIC Blood Culture adequate volume   Culture   Final     NO GROWTH < 12 HOURS Performed at [facility], [address], [place], [place] [address]    Report Status PENDING  Incomplete  CULTURE, BLOOD (ROUTINE X 2) w Reflex to ID Panel     Status: None (Preliminary result)   Collection Time: 08/09/18  3:41 AM   Specimen: BLOOD  Result Value Ref Range Status   Specimen Description BLOOD RIGHT ANTECUBITAL  Final   Special Requests   Final    BOTTLES DRAWN AEROBIC ONLY Blood Culture results may not be optimal due to an inadequate volume of blood received in culture bottles   Culture   Final    NO GROWTH < 12 HOURS Performed at [facility], [address] [place], [place] [address]    Report Status PENDING  Incomplete    Radiology Reports Dg Chest 2 View  Result Date: 08/02/2018 CLINICAL DATA:  New cough and shortness of breath EXAM: CHEST - 2 VIEW COMPARISON:  May 10, 2018 FINDINGS: Vascular stent projects over the left subclavian region. Stable cardiomegaly. The hila, mediastinum, lungs, and pleura are normal. IMPRESSION: Persistent cardiomegaly.  No other abnormalities. Electronically Signed   By: Dorise Bullion III M.D   On: 08/15/2018 08:00   Dg Shoulder 1v Right  Result Date: 08/09/2018 CLINICAL DATA:  Right shoulder pain. EXAM: RIGHT SHOULDER - 1 VIEW COMPARISON:  None. FINDINGS: Single AP view of the shoulder. No fracture. Alignment is maintained. Mild acromioclavicular spurring. No bony destructive change or periosteal reaction. Soft tissues are unremarkable. IMPRESSION: Single portable AP view the shoulder demonstrating mild acromioclavicular degenerative change. No other explanation for pain. Electronically Signed   By: Keith Rake M.D.   On: 08/09/2018 03:52   Dg Abd 1 View  Result Date: 08/09/2018 CLINICAL DATA:  Abdominal discomfort and worsening lactic acidosis. EXAM: ABDOMEN - 1 VIEW COMPARISON:  Abdominal CT 2 days ago. FINDINGS: No bowel dilatation to suggest obstruction. Small bowel loops in the  left abdomen with possible wall thickening. High-density material in the colon is seen on prior. No free air. Vascular coils in the central abdomen. There are vascular calcifications. IMPRESSION: Suspect wall thickening of small bowel loops in the left lower abdomen, possible enteritis. No obstruction or free air. Electronically Signed   By: Keith Rake M.D.   On: 08/09/2018 03:51   Ct Head Wo Contrast  Result Date: 08/07/2018 CLINICAL DATA:  Altered mental status EXAM: CT HEAD WITHOUT CONTRAST TECHNIQUE: Contiguous axial images were obtained from the base of the skull through the vertex without intravenous contrast. COMPARISON:  05/04/2018 FINDINGS: Brain: No evidence of acute infarction, hemorrhage, hydrocephalus, extra-axial collection or mass lesion/mass effect. Vascular: No hyperdense vessel or unexpected calcification. Skull: Normal. Negative for fracture or focal lesion. Sinuses/Orbits: Small, partially imaged air-fluid level of the left maxillary sinus. Other: Contrast opacification of the vascular structures and meninges, likely due to persistent circulating contrast in the setting of renal failure and hemodialysis. IMPRESSION: 1.  No acute intracranial pathology. 2. Small, partially imaged air-fluid level of the left maxillary sinus. Correlate for evidence of  sinusitis. 3. Contrast opacification of the vascular structures and meninges, likely due to persistent circulating contrast in the setting of renal failure and hemodialysis. Electronically Signed   By: Eddie Candle M.D.   On: 08/07/2018 16:31   Ct Chest W Contrast  Result Date: 08/07/2018 CLINICAL DATA:  Chest pain and shortness of breath, abdominal pain and distension today. EXAM: CT CHEST, ABDOMEN, AND PELVIS WITH CONTRAST TECHNIQUE: Multidetector CT imaging of the chest, abdomen and pelvis was performed following the standard protocol during bolus administration of intravenous contrast. CONTRAST:  146mL OMNIPAQUE IOHEXOL 300 MG/ML   SOLN COMPARISON:  Chest CT 08/16/2018 and abdominal CT scan 05/04/2018 FINDINGS: CT CHEST FINDINGS Cardiovascular: The heart is enlarged but stable. No pericardial effusion. Stable tortuosity, ectasia and heavy calcification of the thoracic aorta. Stable three-vessel coronary artery calcifications. Stable left brachiocephalic vein stent. Enlarged pulmonary arteries consistent with pulmonary hypertension. Mediastinum/Nodes: Stable scattered mediastinal and hilar lymph nodes but no mass or overt adenopathy. The esophagus is grossly normal. Lungs/Pleura: Small to moderate-sized right effusion and small diffusion both larger when compared to prior recent chest CT. There is also overlying atelectasis. No overt pulmonary edema, focal pulmonary infiltrates or worrisome pulmonary lesions. Musculoskeletal: No chest wall masses identified. There is diffuse body wall edema noted suggesting anasarca. No supraclavicular or axillary lymphadenopathy the. The bony structures are unremarkable and appears stable. CT ABDOMEN PELVIS FINDINGS Hepatobiliary: Moderate reflux of contrast down the IVC and into the hepatic veins suggesting right heart failure or tricuspid regurgitation. Liver demonstrates changes suggesting hepatic congestion. No worrisome hepatic lesions or intrahepatic biliary dilatation. Contrast noted in the gallbladder related to the recent chest CT with contrast. Pancreas: No mass, inflammation or ductal dilatation. Spleen: Normal size.  No focal lesions. Adrenals/Urinary Tract: The adrenal glands are unremarkable and stable. Small kidneys with extensive arterial calcifications. No worrisome renal lesions. The bladder is grossly normal. Stomach/Bowel: The stomach, duodenum, small bowel and colon are grossly normal. No obvious acute inflammatory process, mass lesion or obstructive findings. Vascular/Lymphatic: Heavy atherosclerotic calcifications involving the aorta and branch vessels, particularly the renal arteries.  Stable scattered mesenteric and retroperitoneal lymph nodes but no mass or overt adenopathy. Reproductive: The prostate gland and seminal vesicles are unremarkable. Other: Diffuse mesenteric edema and small volume abdominal/pelvic ascites. The pelvic fluid demonstrates higher attenuation which could suggest hemorrhage or peritonitis. No obvious active extravasation of contrast material. Coils are noted in the region of the gastroduodenal artery from previous embolization procedure. Musculoskeletal: Severe/aggressive degenerative changes involving the lumbar spine. Amyloid spondyloarthropathy would certainly be a consideration given the patient's history of renal dialysis. IMPRESSION: 1. Enlarging bilateral pleural effusions since the prior CT scan from 2 days ago. Moderate on the right and small the left with overlying atelectasis. 2. Stable cardiac enlargement and vascular congestion but no overt pulmonary edema. 3. Reflux of contrast down the IVC consistent with right heart failure or tricuspid regurgitation. Changes of hepatic congestion are noted. 4. Diffuse body wall edema, mesenteric edema and abdominal/pelvic ascites. The ascites in the abdomen/pelvis is high in attenuation suggesting complex fluid or hemorrhage. No obvious extravasating contrast is identified. 5. Extensive vascular calcifications. 6. Severe and aggressive degenerative changes involving the lumbar spine. Amyloid spondyloarthropathy would be a consideration. Electronically Signed   By: Marijo Sanes M.D.   On: 08/07/2018 19:37   Ct Angio Chest Pe W And/or Wo Contrast  Result Date: 08/06/2018 CLINICAL DATA:  Chest pain. EXAM: CT ANGIOGRAPHY CHEST WITH CONTRAST TECHNIQUE: Multidetector CT imaging of the  chest was performed using the standard protocol during bolus administration of intravenous contrast. Multiplanar CT image reconstructions and MIPs were obtained to evaluate the vascular anatomy. CONTRAST:  63mL OMNIPAQUE IOHEXOL 350 MG/ML  SOLN COMPARISON:  Chest x-ray August 05, 2018.  Chest CT Jul 04, 2016. FINDINGS: Cardiovascular: Cardiomegaly is noted. A stent is seen in the left brachiocephalic vein. Patency cannot be evaluated due to lack of contrast in these vessels due to a right-sided injection. Atherosclerotic changes are seen in the aorta. The ascending thoracic aorta measures up to 4.4 cm, unchanged since May of 2018. No dissection identified. The main pulmonary artery measures 3.7 cm. No pulmonary emboli. Mediastinum/Nodes: The thyroid and esophagus are normal. No adenopathy. Tiny pleural effusions are noted. No pericardial effusion. Lungs/Pleura: Left retrocardiac atelectasis is noted. No other suspicious infiltrates. Central airways are normal. No pneumothorax. Upper Abdomen: No acute abnormality. Musculoskeletal: No chest wall abnormality. No acute or significant osseous findings. Review of the MIP images confirms the above findings. IMPRESSION: 1. No pulmonary emboli identified. 2. Cardiomegaly.  Tiny pleural effusions. 3. There is a stent in the left brachiocephalic vein. Patency cannot be evaluated due to a right-sided injection. 4. Atherosclerotic changes in the aorta. 5. 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 Recommend annual imaging followup by CTA or MRA. This recommendation follows 2010 ACCF/AHA/AATS/ACR/ASA/SCA/SCAI/SIR/STS/SVM Guidelines for the Diagnosis and Management of Patients with Thoracic Aortic Disease. Circulation. 2010; 121: W413-K440. Aortic aneurysm NOS (ICD10-I71.9) 6. The main pulmonary artery measures 3.7 cm which raises the possibility of pulmonary arterial hypertension. 7. Mild left basilar atelectasis in the retrocardiac region. Aortic Atherosclerosis (ICD10-I70.0). Electronically Signed   By: Dorise Bullion III M.D   On: 07/26/2018 10:29   Ct Abdomen Pelvis W Contrast  Result Date: 08/07/2018 CLINICAL DATA:  Chest pain and shortness of breath, abdominal pain  and distension today. EXAM: CT CHEST, ABDOMEN, AND PELVIS WITH CONTRAST TECHNIQUE: Multidetector CT imaging of the chest, abdomen and pelvis was performed following the standard protocol during bolus administration of intravenous contrast. CONTRAST:  165mL OMNIPAQUE IOHEXOL 300 MG/ML  SOLN COMPARISON:  Chest CT 08/03/2018 and abdominal CT scan 05/04/2018 FINDINGS: CT CHEST FINDINGS Cardiovascular: The heart is enlarged but stable. No pericardial effusion. Stable tortuosity, ectasia and heavy calcification of the thoracic aorta. Stable three-vessel coronary artery calcifications. Stable left brachiocephalic vein stent. Enlarged pulmonary arteries consistent with pulmonary hypertension. Mediastinum/Nodes: Stable scattered mediastinal and hilar lymph nodes but no mass or overt adenopathy. The esophagus is grossly normal. Lungs/Pleura: Small to moderate-sized right effusion and small diffusion both larger when compared to prior recent chest CT. There is also overlying atelectasis. No overt pulmonary edema, focal pulmonary infiltrates or worrisome pulmonary lesions. Musculoskeletal: No chest wall masses identified. There is diffuse body wall edema noted suggesting anasarca. No supraclavicular or axillary lymphadenopathy the. The bony structures are unremarkable and appears stable. CT ABDOMEN PELVIS FINDINGS Hepatobiliary: Moderate reflux of contrast down the IVC and into the hepatic veins suggesting right heart failure or tricuspid regurgitation. Liver demonstrates changes suggesting hepatic congestion. No worrisome hepatic lesions or intrahepatic biliary dilatation. Contrast noted in the gallbladder related to the recent chest CT with contrast. Pancreas: No mass, inflammation or ductal dilatation. Spleen: Normal size.  No focal lesions. Adrenals/Urinary Tract: The adrenal glands are unremarkable and stable. Small kidneys with extensive arterial calcifications. No worrisome renal lesions. The bladder is grossly normal.  Stomach/Bowel: The stomach, duodenum, small bowel and colon are grossly normal. No obvious acute  inflammatory process, mass lesion or obstructive findings. Vascular/Lymphatic: Heavy atherosclerotic calcifications involving the aorta and branch vessels, particularly the renal arteries. Stable scattered mesenteric and retroperitoneal lymph nodes but no mass or overt adenopathy. Reproductive: The prostate gland and seminal vesicles are unremarkable. Other: Diffuse mesenteric edema and small volume abdominal/pelvic ascites. The pelvic fluid demonstrates higher attenuation which could suggest hemorrhage or peritonitis. No obvious active extravasation of contrast material. Coils are noted in the region of the gastroduodenal artery from previous embolization procedure. Musculoskeletal: Severe/aggressive degenerative changes involving the lumbar spine. Amyloid spondyloarthropathy would certainly be a consideration given the patient's history of renal dialysis. IMPRESSION: 1. Enlarging bilateral pleural effusions since the prior CT scan from 2 days ago. Moderate on the right and small the left with overlying atelectasis. 2. Stable cardiac enlargement and vascular congestion but no overt pulmonary edema. 3. Reflux of contrast down the IVC consistent with right heart failure or tricuspid regurgitation. Changes of hepatic congestion are noted. 4. Diffuse body wall edema, mesenteric edema and abdominal/pelvic ascites. The ascites in the abdomen/pelvis is high in attenuation suggesting complex fluid or hemorrhage. No obvious extravasating contrast is identified. 5. Extensive vascular calcifications. 6. Severe and aggressive degenerative changes involving the lumbar spine. Amyloid spondyloarthropathy would be a consideration. Electronically Signed   By: Marijo Sanes M.D.   On: 08/07/2018 19:37   Dg Chest Port 1 View  Result Date: 08/09/2018 CLINICAL DATA:  Acute respiratory failure. EXAM: PORTABLE CHEST 1 VIEW COMPARISON:   Radiograph yesterday. FINDINGS: Tip of the right central line in the lower SVC. Vascular stent in the region of brachiocephalic unchanged cardiomegaly. Small bilateral pleural effusions, similar to prior. Improved right lung base atelectasis. No acute airspace disease. No pneumothorax. IMPRESSION: 1. Unchanged small bilateral pleural effusions. Improved right basilar atelectasis. 2. Stable cardiomegaly. Electronically Signed   By: Keith Rake M.D.   On: 08/09/2018 03:49   Dg Chest Port 1 View  Result Date: 08/08/2018 CLINICAL DATA:  Central line placement EXAM: PORTABLE CHEST 1 VIEW COMPARISON:  07/28/2018 FINDINGS: The patient is status post placement of a right-sided central venous catheter. The tip is well position near the cavoatrial junction. The heart size remains enlarged. Aortic calcifications are noted. A vascular stent projects over the left brachiocephalic vein. There are small bilateral pleural effusions, left worse than right. There is an airspace opacity at the right lung base favored to represent atelectasis, however an infiltrate or aspiration is not excluded. There is no pneumothorax. IMPRESSION: 1. Well-positioned right-sided central venous catheter. No pneumothorax. 2. Cardiomegaly with persistent small bilateral pleural effusions and generalized volume overload. Electronically Signed   By: Constance Holster M.D.   On: 08/08/2018 16:41   Ct Angio Chest/abd/pel For Dissection W And/or Wo Contrast  Result Date: 08/04/2018 CLINICAL DATA:  Chest pain for 4 days.  Cough. EXAM: CT ANGIOGRAPHY CHEST, ABDOMEN AND PELVIS TECHNIQUE: Multidetector CT imaging through the chest, abdomen and pelvis was performed using the standard protocol during bolus administration of intravenous contrast. Multiplanar reconstructed images and MIPs were obtained and reviewed to evaluate the vascular anatomy. CONTRAST:  167mL ISOVUE-370 IOPAMIDOL (ISOVUE-370) INJECTION 76% COMPARISON:  CT of the chest August 05, 2018. CT of the abdomen and pelvis May 04, 2018 FINDINGS: CTA CHEST FINDINGS Cardiovascular: Cardiomegaly. Coronary artery calcifications identified. The pulmonary arteries were better assessed on the recent CT a of the chest for PE. No pulmonary emboli in the central pulmonary arteries. The patient had a normal PE study earlier today as well. Atherosclerotic  changes are seen in the thoracic aorta without dissection. The ascending thoracic aorta measures 4.2 cm on today's study, mildly aneurysmal. Mediastinum/Nodes: There is a stent in the left brachiocephalic vein which is well opacified on this study. The thyroid and esophagus are normal. A borderline node anterior to the trachea measuring 14 mm on axial image 40 is stable since 2018, likely reactive. No other evidence of adenopathy identified. Small pleural effusions. No pericardial effusion. Mild increased attenuation in the subcutaneous fat suggests volume overload. Lungs/Pleura: Tiny pleural effusions. Central airways are normal. Mild atelectasis in the posterior left upper lobe. Mild atelectasis in the retrocardiac region, stable. No pneumothorax. Central airways are unchanged no suspicious pulmonary nodules, masses, or focal infiltrates. Musculoskeletal: There is a healed anterior left rib fracture. No acute bony abnormalities. Review of the MIP images confirms the above findings. CTA ABDOMEN AND PELVIS FINDINGS VASCULAR Aorta: Atherosclerotic change is seen in the nonaneurysmal abdominal aorta, extending into the iliac and femoral vessels. No dissection. Celiac: Patent without evidence of aneurysm, dissection, vasculitis or significant stenosis. SMA: Atherosclerotic changes near the origin of the SMA without significant stenosis. No other abnormalities. Renals: Atherosclerosis at the origin of the bilateral renal arteries is identified. IMA: Patent without evidence of aneurysm, dissection, vasculitis or significant stenosis. Inflow: Patent without  evidence of aneurysm, dissection, vasculitis or significant stenosis. Veins: No obvious venous abnormality within the limitations of this arterial phase study. Review of the MIP images confirms the above findings. NON-VASCULAR Hepatobiliary: No focal liver abnormality is seen. No gallstones, gallbladder wall thickening, or biliary dilatation. Pancreas: Unremarkable. No pancreatic ductal dilatation or surrounding inflammatory changes. Spleen: Normal in size without focal abnormality. Adrenals/Urinary Tract: Adrenal glands are normal. The kidneys are atrophic consistent with renal disease. No hydronephrosis or acute perinephric stranding. The ureters are normal. The bladder is poorly distended but unremarkable. Stomach/Bowel: The stomach and small bowel are unremarkable. The colon is normal. The appendix is unremarkable. Lymphatic: No significant vascular findings are present. No enlarged abdominal or pelvic lymph nodes. Reproductive: Prostate is unremarkable. Other: There is increased attenuation in the subcutaneous fat and in the intra-abdominal fat consistent volume overload. There is a small amount of fluid in the right pericolic gutter. Some of this fluid is adjacent to the appendix but there is no convincing evidence of appendicitis. Musculoskeletal: No acute or significant osseous findings. Review of the MIP images confirms the above findings. IMPRESSION: 1. 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 This is stable since 2018. No dissection. Atherosclerotic changes noted. Recommend annual imaging followup by CTA or MRA. This recommendation follows 2010 ACCF/AHA/AATS/ACR/ASA/SCA/SCAI/SIR/STS/SVM Guidelines for the Diagnosis and Management of Patients with Thoracic Aortic Disease. Circulation. 2010; 121: B341-P379. Aortic aneurysm NOS (ICD10-I71.9) 2. Cardiomegaly.  Coronary artery calcifications. 3. Increased attenuation in the subcutaneous fat diffusely as well as  in the intra-abdominal fat with a small amount of fluid in the right pericolic gutter. These findings are most consistent with volume overload. 4. Tiny bilateral pleural effusions. Electronically Signed   By: Dorise Bullion III M.D   On: 08/12/2018 11:36     CBC Recent Labs  Lab 08/09/2018 0710 08/06/18 0045 08/07/18 2041 08/08/18 0957 08/09/18 0323  WBC 7.3 5.9  --  9.9 12.7*  HGB 10.1* 10.4* 11.7* 11.5* 10.5*  HCT 31.6* 32.9*  --  37.6* 34.0*  PLT 150 114*  --  108* 95*  MCV 87.8 88.0  --  89.7 88.8  MCH 28.1 27.8  --  27.4 27.4  MCHC 32.0 31.6  --  30.6 30.9  RDW 19.1* 19.5*  --  19.7* 19.3*  LYMPHSABS 0.8  --   --  0.6*  --   MONOABS 1.8*  --   --  1.3*  --   EOSABS 0.1  --   --  0.0  --   BASOSABS 0.1  --   --  0.0  --     Chemistries  Recent Labs  Lab 08/14/2018 0710  08/07/18 1513  08/08/18 1749 08/08/18 2235 08/09/18 0203 08/09/18 0607 08/09/18 0726 08/09/18 0822 08/09/18 1002  NA 141   < >  --    < > 131* 133* 132* 134*  --   --  135  K 4.4   < >  --    < > 6.6* 6.8* 7.0* 4.3 5.6* 5.5* 4.7  CL 105   < >  --    < > 97* 95* 94* 97*  --   --  95*  CO2 23   < >  --    < > 16* 14* 13* 17*  --   --  20*  GLUCOSE 78   < >  --    < > 130* 115* 132* 131*  --   --  140*  BUN 45*   < >  --    < > 79* 82* 69* 59*  --   --  57*  CREATININE 5.27*   < >  --    < > 7.41* 7.79* 6.91* 5.53*  --   --  5.36*  CALCIUM 8.3*   < >  --    < > 8.7* 8.9 8.8* 9.2  --   --  9.5  MG  --    < >  --    < > 2.1 2.2 2.1 1.7  --   --  1.8  AST 26  --  51*  --   --   --   --   --   --   --   --   ALT 21  --  27  --   --   --   --   --   --   --   --   ALKPHOS 81  --  87  --   --   --   --   --   --   --   --   BILITOT 1.0  --  1.0  --   --   --   --   --   --   --   --    < > = values in this interval not displayed.   ------------------------------------------------------------------------------------------------------------------ estimated creatinine clearance is 13.5 mL/min (A) (by C-G  formula based on SCr of 5.36 mg/dL (H)). ------------------------------------------------------------------------------------------------------------------ Recent Labs    08/07/18 1513  HGBA1C 5.3   ------------------------------------------------------------------------------------------------------------------ No results for input(s): CHOL, HDL, LDLCALC, TRIG, CHOLHDL, LDLDIRECT in the last 72 hours. ------------------------------------------------------------------------------------------------------------------ No results for input(s): TSH, T4TOTAL, T3FREE, THYROIDAB in the last 72 hours.  Invalid input(s): FREET3 ------------------------------------------------------------------------------------------------------------------ No results for input(s): VITAMINB12, FOLATE, FERRITIN, TIBC, IRON, RETICCTPCT in the last 72 hours.  Coagulation profile No results for input(s): INR, PROTIME in the last 168 hours.  No results for input(s): DDIMER in the last 72 hours.  Cardiac Enzymes Recent Labs  Lab 08/07/18 1855 08/08/18 0115 08/08/18 0957  TROPONINI 2.05* 1.92* 1.73*   ------------------------------------------------------------------------------------------------------------------ Invalid input(s): POCBNP    Assessment & Plan  IMPRESSION AND PLAN: Patient 63 year old with multiple medical problems presenting with chest pain and hypotension  1.  Hypotension etiology unclear Continue midodrine, continue stress dose steroids No evidence of infection  Patient on Levophed now Cortisol level normal  2.  Atrial flutter with intermittent bradycardia now in normal sinus rhythm Cardiology following patient  3.  Chest pain etiology unclear Elevated troponin felt by cardiology due to demand ischemia  4.  End-stage renal disease fluid removal per nephrology  5.    Hypoglycemia with type 2 diabetes patient not on any medications his insulin level and C-peptide levels suggest  possible insulinoma blood sugars are stable  6.  Hyperlipidemia continue Lipitor  7.    Anasarca and ascites continue supportive care with fluid removal  8.  Chronic systolic and diastolic CHF Fluid removal per nephrology  9.  Cocaine abuse  Prognosis is very poor     Code Status Orders  (From admission, onward)         Start     Ordered   08/02/2018 1507  Full code  Continuous     08/08/2018 1506        Code Status History    Date Active Date Inactive Code Status Order ID Comments User Context   05/03/2018 1843 05/10/2018 1609 Full Code [id number]  Bettey Costa, MD ED   04/18/2018 0420 04/22/2018 0122 Full Code [id number]  Harrie Foreman, MD Inpatient   02/08/2018 0233 02/08/2018 2214 Full Code [id number]  Lance Coon, MD ED   03/14/2017 0007 03/14/2017 2204 Full Code [id number]  Amelia Jo, MD Inpatient   02/15/2017 2136 02/18/2017 1812 Full Code [id number]  Demetrios Loll, MD Inpatient   09/20/2016 0318 09/21/2016 1915 Full Code [id number]  Lance Coon, MD ED   08/22/2016 1658 08/24/2016 1607 Full Code [id number]  Demetrios Loll, MD Inpatient   07/04/2016 1929 07/05/2016 1728 Full Code [id number]  Idelle Crouch, MD Inpatient   06/07/2016 0102 06/08/2016 1514 Full Code [id number]  Hugelmeyer, [place], DO Inpatient   04/30/2016 0007 05/01/2016 1821 Full Code [id number]  Lance Coon, MD Inpatient   02/26/2016 2107 02/28/2016 1804 Full Code [id number]  Lance Coon, MD Inpatient   01/07/2016 0132 01/07/2016 1724 Full Code [id number]  Hugelmeyer, Alexis, DO ED   08/02/2015 2316 08/04/2015 2311 Full Code [id number]  Quintella Baton, MD Inpatient   12/02/2014 2052 12/04/2014 1753 Full Code [id number]  Vaughan Basta, MD Inpatient   11/10/2014 2323 11/12/2014 1506 Full Code [id number]  Gladstone Lighter, MD Inpatient   Advance Care Planning Activity           Consults   Nephrology, pulmonary critical   DVT Prophylaxis  scd's  Lab Results  Component Value Date   PLT 95 (L) 08/09/2018      Time Spent in minutes  26min  Greater than 50% of time spent in care coordination and counseling patient regarding the condition and plan of care.   Dustin Flock M.D on 08/09/2018 at 1:13 PM  Between 7am to 6pm - Pager - [phone]  After 6pm go to [email or web address] - Proofreader  [employer]   Office  [phone]

## 2018-08-09 NOTE — Progress Notes (Signed)
Follow up - Critical Care Medicine Note  Patient Details:    [name] is an 63 y.o. male  with a PMH of ESRD on HD (M-W-F), PVC's, Pulmonary HTN, Dyspnea, PVD, Myocardial Infarction, CHF, HTN, Type II Diabetes Mellitus, Chronic Combined Systolic and Diastolic CHF, Chronic Home O2 @2L , CAD, Hepatitis C, and Arthritis.  He presented to [facility] ER on 06/13 via EMS with c/o chest pain worse with coughing and shortness of breath.  He was noted to be hypotensive and with significant troponin elevation due to demand ischemia.  He is not a candidate for anticoagulation due to prior massive GI bleeds.  In the ICU due to encephalopathy and requirement for CRRT.  Lines, Airways, Drains:    Anti-infectives:  Anti-infectives (From admission, onward)   Start     Dose/Rate Route Frequency Ordered Stop   08/09/18 0515  metroNIDAZOLE (FLAGYL) IVPB 500 mg     500 mg 100 mL/hr over 60 Minutes Intravenous Every 8 hours 08/09/18 0506     08/08/18 2200  ceFEPIme (MAXIPIME) 2 g in sodium chloride 0.9 % 100 mL IVPB     2 g 200 mL/hr over 30 Minutes Intravenous Every 12 hours 08/08/18 1335     08/07/18 1200  ceFEPIme (MAXIPIME) 2 g in sodium chloride 0.9 % 100 mL IVPB  Status:  Discontinued     2 g 200 mL/hr over 30 Minutes Intravenous Every M-W-F (Hemodialysis) 08/20/2018 1250 08/08/18 1335   08/07/18 1200  vancomycin (VANCOCIN) IVPB 750 mg/150 ml premix  Status:  Discontinued     750 mg 150 mL/hr over 60 Minutes Intravenous Every M-W-F (Hemodialysis) 08/15/2018 1250 08/07/18 2155   07/25/2018 1415  vancomycin (VANCOCIN) IVPB 750 mg/150 ml premix     750 mg 150 mL/hr over 60 Minutes Intravenous  Once 08/18/2018 1250 08/16/2018 1600   08/11/2018 0730  ceFEPIme (MAXIPIME) 2 g in sodium chloride 0.9 % 100 mL IVPB     2 g 200 mL/hr over 30 Minutes Intravenous  Once 08/06/2018 0715 07/26/2018 0826   07/25/2018 0730  metroNIDAZOLE (FLAGYL) IVPB 500 mg     500 mg 100 mL/hr over 60 Minutes Intravenous  Once 08/04/2018 0715 08/09/2018  0908   08/12/2018 0730  vancomycin (VANCOCIN) IVPB 1000 mg/200 mL premix     1,000 mg 200 mL/hr over 60 Minutes Intravenous  Once 08/16/2018 0715 08/14/2018 0908      Microbiology: Results for orders placed or performed during the hospital encounter of 08/21/2018  Blood culture (routine x 2)     Status: None (Preliminary result)   Collection Time: 08/15/2018  7:46 AM   Specimen: BLOOD  Result Value Ref Range Status   Specimen Description BLOOD RFA  Final   Special Requests   Final    BOTTLES DRAWN AEROBIC AND ANAEROBIC Blood Culture results may not be optimal due to an excessive volume of blood received in culture bottles   Culture   Final    NO GROWTH 4 DAYS Performed at [facility], [address] [place],  [address]    Report Status PENDING  Incomplete  Blood culture (routine x 2)     Status: None (Preliminary result)   Collection Time: 08/11/2018  7:47 AM   Specimen: BLOOD  Result Value Ref Range Status   Specimen Description BLOOD R AC  Final   Special Requests   Final    BOTTLES DRAWN AEROBIC AND ANAEROBIC Blood Culture results may not be optimal due to an excessive volume  of blood received in culture bottles   Culture   Final    NO GROWTH 4 DAYS Performed at [facility], [address] [place], [place] [address]    Report Status PENDING  Incomplete  SARS Coronavirus 2 (CEPHEID- Performed in [place] hospital lab), Hosp Order     Status: None   Collection Time: 08/17/2018  8:34 AM   Specimen: Nasopharyngeal Swab  Result Value Ref Range Status   SARS Coronavirus 2 NEGATIVE NEGATIVE Final    Comment: (NOTE) If result is NEGATIVE SARS-CoV-2 target nucleic acids are NOT DETECTED. The SARS-CoV-2 RNA is generally detectable in upper and lower  respiratory specimens during the acute phase of infection. The lowest  concentration of SARS-CoV-2 viral copies this assay can detect is 250  copies / mL. A negative result does not preclude SARS-CoV-2 infection   and should not be used as the sole basis for treatment or other  patient management decisions.  A negative result may occur with  improper specimen collection / handling, submission of specimen other  than nasopharyngeal swab, presence of viral mutation(s) within the  areas targeted by this assay, and inadequate number of viral copies  (<250 copies / mL). A negative result must be combined with clinical  observations, patient history, and epidemiological information. If result is POSITIVE SARS-CoV-2 target nucleic acids are DETECTED. The SARS-CoV-2 RNA is generally detectable in upper and lower  respiratory specimens dur ing the acute phase of infection.  Positive  results are indicative of active infection with SARS-CoV-2.  Clinical  correlation with patient history and other diagnostic information is  necessary to determine patient infection status.  Positive results do  not rule out bacterial infection or co-infection with other viruses. If result is PRESUMPTIVE POSTIVE SARS-CoV-2 nucleic acids MAY BE PRESENT.   A presumptive positive result was obtained on the submitted specimen  and confirmed on repeat testing.  While 2019 novel coronavirus  (SARS-CoV-2) nucleic acids may be present in the submitted sample  additional confirmatory testing may be necessary for epidemiological  and / or clinical management purposes  to differentiate between  SARS-CoV-2 and other Sarbecovirus currently known to infect humans.  If clinically indicated additional testing with an alternate test  methodology [phone]) is advised. The SARS-CoV-2 RNA is generally  detectable in upper and lower respiratory sp ecimens during the acute  phase of infection. The expected result is Negative. Fact Sheet for Patients:  StrictlyIdeas.no Fact Sheet for Healthcare Providers: BankingDealers.co.za This test is not yet approved or cleared by the [place] FDA  and has been authorized for detection and/or diagnosis of SARS-CoV-2 by FDA under an Emergency Use Authorization (EUA).  This EUA will remain in effect (meaning this test can be used) for the duration of the COVID-19 declaration under Section 564(b)(1) of the Act, 21 U.S.C. section 360bbb-3(b)(1), unless the authorization is terminated or revoked sooner. Performed at [facility], [address] [place], [place] [address]   MRSA PCR Screening     Status: None   Collection Time: 07/25/2018  1:50 PM   Specimen: Nasal Mucosa; Nasopharyngeal  Result Value Ref Range Status   MRSA by PCR NEGATIVE NEGATIVE Final    Comment:        The GeneXpert MRSA Assay (FDA approved for NASAL specimens only), is one component of a comprehensive MRSA colonization surveillance program. It is not intended to diagnose MRSA infection nor to guide or monitor treatment for MRSA infections. Performed at [facility], [place], [place] [address]   CULTURE, BLOOD (ROUTINE X 2) w Reflex to ID Panel     Status: None (Preliminary result)   Collection Time: 08/09/18  3:24 AM   Specimen: BLOOD  Result Value Ref Range Status   Specimen Description BLOOD BLOOD RIGHT ARM  Final   Special Requests   Final    BOTTLES DRAWN AEROBIC AND ANAEROBIC Blood Culture adequate volume   Culture   Final    NO GROWTH < 12 HOURS Performed at [facility], [address], [place], [place] [address]    Report Status PENDING  Incomplete  CULTURE, BLOOD (ROUTINE X 2) w Reflex to ID Panel     Status: None (Preliminary result)   Collection Time: 08/09/18  3:41 AM   Specimen: BLOOD  Result Value Ref Range Status   Specimen Description BLOOD RIGHT ANTECUBITAL  Final   Special Requests   Final    BOTTLES DRAWN AEROBIC ONLY Blood Culture results may not be optimal due to an inadequate volume of blood received in culture bottles   Culture   Final    NO GROWTH < 12 HOURS Performed at  [facility], [address], [place], [place] [address]    Report Status PENDING  Incomplete    Best Practice/Protocols:  VTE Prophylaxis: Heparin (CRRT) GI Prophylaxis: Proton Pump Inhibitor Hyperglycemia (ICU)  Events: 6/16 dialysis catheter right IJ 6/16 CRRT initiated  Studies: Dg Chest 2 View  Result Date: 08/15/2018 CLINICAL DATA:  New cough and shortness of breath EXAM: CHEST - 2 VIEW COMPARISON:  May 10, 2018 FINDINGS: Vascular stent projects over the left subclavian region. Stable cardiomegaly. The hila, mediastinum, lungs, and pleura are normal. IMPRESSION: Persistent cardiomegaly.  No other abnormalities. Electronically Signed   By: Dorise Bullion III M.D   On: 08/04/2018 08:00   Dg Shoulder 1v Right  Result Date: 08/09/2018 CLINICAL DATA:  Right shoulder pain. EXAM: RIGHT SHOULDER - 1 VIEW COMPARISON:  None. FINDINGS: Single AP view of the shoulder. No fracture. Alignment is maintained. Mild acromioclavicular spurring. No bony destructive change or periosteal reaction. Soft tissues are unremarkable. IMPRESSION: Single portable AP view the shoulder demonstrating mild acromioclavicular degenerative change. No other explanation for pain. Electronically Signed   By: Keith Rake M.D.   On: 08/09/2018 03:52   Dg Abd 1 View  Result Date: 08/09/2018 CLINICAL DATA:  Abdominal discomfort and worsening lactic acidosis. EXAM: ABDOMEN - 1 VIEW COMPARISON:  Abdominal CT 2 days ago. FINDINGS: No bowel dilatation to suggest obstruction. Small bowel loops in the left abdomen with possible wall thickening. High-density material in the colon is seen on prior. No free air. Vascular coils in the central abdomen. There are vascular calcifications. IMPRESSION: Suspect wall thickening of small bowel loops in the left lower abdomen, possible enteritis. No obstruction or free air. Electronically Signed   By: Keith Rake M.D.   On: 08/09/2018 03:51   Ct Head Wo Contrast  Result  Date: 08/07/2018 CLINICAL DATA:  Altered mental status EXAM: CT HEAD WITHOUT CONTRAST TECHNIQUE: Contiguous axial images were obtained from the base of the skull through the vertex without intravenous contrast. COMPARISON:  05/04/2018 FINDINGS: Brain: No evidence of acute infarction, hemorrhage, hydrocephalus, extra-axial collection or mass lesion/mass effect. Vascular: No hyperdense vessel or unexpected calcification. Skull: Normal. Negative for fracture or focal lesion. Sinuses/Orbits: Small, partially imaged air-fluid level of the left maxillary sinus. Other: Contrast opacification of the vascular structures and meninges, likely  due to persistent circulating contrast in the setting of renal failure and hemodialysis. IMPRESSION: 1.  No acute intracranial pathology. 2. Small, partially imaged air-fluid level of the left maxillary sinus. Correlate for evidence of sinusitis. 3. Contrast opacification of the vascular structures and meninges, likely due to persistent circulating contrast in the setting of renal failure and hemodialysis. Electronically Signed   By: Eddie Candle M.D.   On: 08/07/2018 16:31   Ct Chest W Contrast  Result Date: 08/07/2018 CLINICAL DATA:  Chest pain and shortness of breath, abdominal pain and distension today. EXAM: CT CHEST, ABDOMEN, AND PELVIS WITH CONTRAST TECHNIQUE: Multidetector CT imaging of the chest, abdomen and pelvis was performed following the standard protocol during bolus administration of intravenous contrast. CONTRAST:  158mL OMNIPAQUE IOHEXOL 300 MG/ML  SOLN COMPARISON:  Chest CT 08/18/2018 and abdominal CT scan 05/04/2018 FINDINGS: CT CHEST FINDINGS Cardiovascular: The heart is enlarged but stable. No pericardial effusion. Stable tortuosity, ectasia and heavy calcification of the thoracic aorta. Stable three-vessel coronary artery calcifications. Stable left brachiocephalic vein stent. Enlarged pulmonary arteries consistent with pulmonary hypertension. Mediastinum/Nodes:  Stable scattered mediastinal and hilar lymph nodes but no mass or overt adenopathy. The esophagus is grossly normal. Lungs/Pleura: Small to moderate-sized right effusion and small diffusion both larger when compared to prior recent chest CT. There is also overlying atelectasis. No overt pulmonary edema, focal pulmonary infiltrates or worrisome pulmonary lesions. Musculoskeletal: No chest wall masses identified. There is diffuse body wall edema noted suggesting anasarca. No supraclavicular or axillary lymphadenopathy the. The bony structures are unremarkable and appears stable. CT ABDOMEN PELVIS FINDINGS Hepatobiliary: Moderate reflux of contrast down the IVC and into the hepatic veins suggesting right heart failure or tricuspid regurgitation. Liver demonstrates changes suggesting hepatic congestion. No worrisome hepatic lesions or intrahepatic biliary dilatation. Contrast noted in the gallbladder related to the recent chest CT with contrast. Pancreas: No mass, inflammation or ductal dilatation. Spleen: Normal size.  No focal lesions. Adrenals/Urinary Tract: The adrenal glands are unremarkable and stable. Small kidneys with extensive arterial calcifications. No worrisome renal lesions. The bladder is grossly normal. Stomach/Bowel: The stomach, duodenum, small bowel and colon are grossly normal. No obvious acute inflammatory process, mass lesion or obstructive findings. Vascular/Lymphatic: Heavy atherosclerotic calcifications involving the aorta and branch vessels, particularly the renal arteries. Stable scattered mesenteric and retroperitoneal lymph nodes but no mass or overt adenopathy. Reproductive: The prostate gland and seminal vesicles are unremarkable. Other: Diffuse mesenteric edema and small volume abdominal/pelvic ascites. The pelvic fluid demonstrates higher attenuation which could suggest hemorrhage or peritonitis. No obvious active extravasation of contrast material. Coils are noted in the region of the  gastroduodenal artery from previous embolization procedure. Musculoskeletal: Severe/aggressive degenerative changes involving the lumbar spine. Amyloid spondyloarthropathy would certainly be a consideration given the patient's history of renal dialysis. IMPRESSION: 1. Enlarging bilateral pleural effusions since the prior CT scan from 2 days ago. Moderate on the right and small the left with overlying atelectasis. 2. Stable cardiac enlargement and vascular congestion but no overt pulmonary edema. 3. Reflux of contrast down the IVC consistent with right heart failure or tricuspid regurgitation. Changes of hepatic congestion are noted. 4. Diffuse body wall edema, mesenteric edema and abdominal/pelvic ascites. The ascites in the abdomen/pelvis is high in attenuation suggesting complex fluid or hemorrhage. No obvious extravasating contrast is identified. 5. Extensive vascular calcifications. 6. Severe and aggressive degenerative changes involving the lumbar spine. Amyloid spondyloarthropathy would be a consideration. Electronically Signed   By: Ricky Stabs.D.  On: 08/07/2018 19:37   Ct Angio Chest Pe W And/or Wo Contrast  Result Date: 07/27/2018 CLINICAL DATA:  Chest pain. EXAM: CT ANGIOGRAPHY CHEST WITH CONTRAST TECHNIQUE: Multidetector CT imaging of the chest was performed using the standard protocol during bolus administration of intravenous contrast. Multiplanar CT image reconstructions and MIPs were obtained to evaluate the vascular anatomy. CONTRAST:  85mL OMNIPAQUE IOHEXOL 350 MG/ML SOLN COMPARISON:  Chest x-ray August 05, 2018.  Chest CT Jul 04, 2016. FINDINGS: Cardiovascular: Cardiomegaly is noted. A stent is seen in the left brachiocephalic vein. Patency cannot be evaluated due to lack of contrast in these vessels due to a right-sided injection. Atherosclerotic changes are seen in the aorta. The ascending thoracic aorta measures up to 4.4 cm, unchanged since May of 2018. No dissection identified. The  main pulmonary artery measures 3.7 cm. No pulmonary emboli. Mediastinum/Nodes: The thyroid and esophagus are normal. No adenopathy. Tiny pleural effusions are noted. No pericardial effusion. Lungs/Pleura: Left retrocardiac atelectasis is noted. No other suspicious infiltrates. Central airways are normal. No pneumothorax. Upper Abdomen: No acute abnormality. Musculoskeletal: No chest wall abnormality. No acute or significant osseous findings. Review of the MIP images confirms the above findings. IMPRESSION: 1. No pulmonary emboli identified. 2. Cardiomegaly.  Tiny pleural effusions. 3. There is a stent in the left brachiocephalic vein. Patency cannot be evaluated due to a right-sided injection. 4. Atherosclerotic changes in the aorta. 5. 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 Recommend annual imaging followup by CTA or MRA. This recommendation follows 2010 ACCF/AHA/AATS/ACR/ASA/SCA/SCAI/SIR/STS/SVM Guidelines for the Diagnosis and Management of Patients with Thoracic Aortic Disease. Circulation. 2010; 121: E563-J497. Aortic aneurysm NOS (ICD10-I71.9) 6. The main pulmonary artery measures 3.7 cm which raises the possibility of pulmonary arterial hypertension. 7. Mild left basilar atelectasis in the retrocardiac region. Aortic Atherosclerosis (ICD10-I70.0). Electronically Signed   By: Dorise Bullion III M.D   On: 07/26/2018 10:29   Ct Abdomen Pelvis W Contrast  Result Date: 08/07/2018 CLINICAL DATA:  Chest pain and shortness of breath, abdominal pain and distension today. EXAM: CT CHEST, ABDOMEN, AND PELVIS WITH CONTRAST TECHNIQUE: Multidetector CT imaging of the chest, abdomen and pelvis was performed following the standard protocol during bolus administration of intravenous contrast. CONTRAST:  134mL OMNIPAQUE IOHEXOL 300 MG/ML  SOLN COMPARISON:  Chest CT 07/30/2018 and abdominal CT scan 05/04/2018 FINDINGS: CT CHEST FINDINGS Cardiovascular: The heart is enlarged but  stable. No pericardial effusion. Stable tortuosity, ectasia and heavy calcification of the thoracic aorta. Stable three-vessel coronary artery calcifications. Stable left brachiocephalic vein stent. Enlarged pulmonary arteries consistent with pulmonary hypertension. Mediastinum/Nodes: Stable scattered mediastinal and hilar lymph nodes but no mass or overt adenopathy. The esophagus is grossly normal. Lungs/Pleura: Small to moderate-sized right effusion and small diffusion both larger when compared to prior recent chest CT. There is also overlying atelectasis. No overt pulmonary edema, focal pulmonary infiltrates or worrisome pulmonary lesions. Musculoskeletal: No chest wall masses identified. There is diffuse body wall edema noted suggesting anasarca. No supraclavicular or axillary lymphadenopathy the. The bony structures are unremarkable and appears stable. CT ABDOMEN PELVIS FINDINGS Hepatobiliary: Moderate reflux of contrast down the IVC and into the hepatic veins suggesting right heart failure or tricuspid regurgitation. Liver demonstrates changes suggesting hepatic congestion. No worrisome hepatic lesions or intrahepatic biliary dilatation. Contrast noted in the gallbladder related to the recent chest CT with contrast. Pancreas: No mass, inflammation or ductal dilatation. Spleen: Normal size.  No focal lesions. Adrenals/Urinary Tract: The adrenal glands  are unremarkable and stable. Small kidneys with extensive arterial calcifications. No worrisome renal lesions. The bladder is grossly normal. Stomach/Bowel: The stomach, duodenum, small bowel and colon are grossly normal. No obvious acute inflammatory process, mass lesion or obstructive findings. Vascular/Lymphatic: Heavy atherosclerotic calcifications involving the aorta and branch vessels, particularly the renal arteries. Stable scattered mesenteric and retroperitoneal lymph nodes but no mass or overt adenopathy. Reproductive: The prostate gland and seminal  vesicles are unremarkable. Other: Diffuse mesenteric edema and small volume abdominal/pelvic ascites. The pelvic fluid demonstrates higher attenuation which could suggest hemorrhage or peritonitis. No obvious active extravasation of contrast material. Coils are noted in the region of the gastroduodenal artery from previous embolization procedure. Musculoskeletal: Severe/aggressive degenerative changes involving the lumbar spine. Amyloid spondyloarthropathy would certainly be a consideration given the patient's history of renal dialysis. IMPRESSION: 1. Enlarging bilateral pleural effusions since the prior CT scan from 2 days ago. Moderate on the right and small the left with overlying atelectasis. 2. Stable cardiac enlargement and vascular congestion but no overt pulmonary edema. 3. Reflux of contrast down the IVC consistent with right heart failure or tricuspid regurgitation. Changes of hepatic congestion are noted. 4. Diffuse body wall edema, mesenteric edema and abdominal/pelvic ascites. The ascites in the abdomen/pelvis is high in attenuation suggesting complex fluid or hemorrhage. No obvious extravasating contrast is identified. 5. Extensive vascular calcifications. 6. Severe and aggressive degenerative changes involving the lumbar spine. Amyloid spondyloarthropathy would be a consideration. Electronically Signed   By: Marijo Sanes M.D.   On: 08/07/2018 19:37   Dg Chest Port 1 View  Result Date: 08/09/2018 CLINICAL DATA:  Acute respiratory failure. EXAM: PORTABLE CHEST 1 VIEW COMPARISON:  Radiograph yesterday. FINDINGS: Tip of the right central line in the lower SVC. Vascular stent in the region of brachiocephalic unchanged cardiomegaly. Small bilateral pleural effusions, similar to prior. Improved right lung base atelectasis. No acute airspace disease. No pneumothorax. IMPRESSION: 1. Unchanged small bilateral pleural effusions. Improved right basilar atelectasis. 2. Stable cardiomegaly. Electronically  Signed   By: Keith Rake M.D.   On: 08/09/2018 03:49   Dg Chest Port 1 View  Result Date: 08/08/2018 CLINICAL DATA:  Central line placement EXAM: PORTABLE CHEST 1 VIEW COMPARISON:  08/18/2018 FINDINGS: The patient is status post placement of a right-sided central venous catheter. The tip is well position near the cavoatrial junction. The heart size remains enlarged. Aortic calcifications are noted. A vascular stent projects over the left brachiocephalic vein. There are small bilateral pleural effusions, left worse than right. There is an airspace opacity at the right lung base favored to represent atelectasis, however an infiltrate or aspiration is not excluded. There is no pneumothorax. IMPRESSION: 1. Well-positioned right-sided central venous catheter. No pneumothorax. 2. Cardiomegaly with persistent small bilateral pleural effusions and generalized volume overload. Electronically Signed   By: Constance Holster M.D.   On: 08/08/2018 16:41   Ct Angio Chest/abd/pel For Dissection W And/or Wo Contrast  Result Date: 07/24/2018 CLINICAL DATA:  Chest pain for 4 days.  Cough. EXAM: CT ANGIOGRAPHY CHEST, ABDOMEN AND PELVIS TECHNIQUE: Multidetector CT imaging through the chest, abdomen and pelvis was performed using the standard protocol during bolus administration of intravenous contrast. Multiplanar reconstructed images and MIPs were obtained and reviewed to evaluate the vascular anatomy. CONTRAST:  175mL ISOVUE-370 IOPAMIDOL (ISOVUE-370) INJECTION 76% COMPARISON:  CT of the chest August 05, 2018. CT of the abdomen and pelvis May 04, 2018 FINDINGS: CTA CHEST FINDINGS Cardiovascular: Cardiomegaly. Coronary artery calcifications identified. The pulmonary arteries  were better assessed on the recent CT a of the chest for PE. No pulmonary emboli in the central pulmonary arteries. The patient had a normal PE study earlier today as well. Atherosclerotic changes are seen in the thoracic aorta without dissection.  The ascending thoracic aorta measures 4.2 cm on today's study, mildly aneurysmal. Mediastinum/Nodes: There is a stent in the left brachiocephalic vein which is well opacified on this study. The thyroid and esophagus are normal. A borderline node anterior to the trachea measuring 14 mm on axial image 40 is stable since 2018, likely reactive. No other evidence of adenopathy identified. Small pleural effusions. No pericardial effusion. Mild increased attenuation in the subcutaneous fat suggests volume overload. Lungs/Pleura: Tiny pleural effusions. Central airways are normal. Mild atelectasis in the posterior left upper lobe. Mild atelectasis in the retrocardiac region, stable. No pneumothorax. Central airways are unchanged no suspicious pulmonary nodules, masses, or focal infiltrates. Musculoskeletal: There is a healed anterior left rib fracture. No acute bony abnormalities. Review of the MIP images confirms the above findings. CTA ABDOMEN AND PELVIS FINDINGS VASCULAR Aorta: Atherosclerotic change is seen in the nonaneurysmal abdominal aorta, extending into the iliac and femoral vessels. No dissection. Celiac: Patent without evidence of aneurysm, dissection, vasculitis or significant stenosis. SMA: Atherosclerotic changes near the origin of the SMA without significant stenosis. No other abnormalities. Renals: Atherosclerosis at the origin of the bilateral renal arteries is identified. IMA: Patent without evidence of aneurysm, dissection, vasculitis or significant stenosis. Inflow: Patent without evidence of aneurysm, dissection, vasculitis or significant stenosis. Veins: No obvious venous abnormality within the limitations of this arterial phase study. Review of the MIP images confirms the above findings. NON-VASCULAR Hepatobiliary: No focal liver abnormality is seen. No gallstones, gallbladder wall thickening, or biliary dilatation. Pancreas: Unremarkable. No pancreatic ductal dilatation or surrounding inflammatory  changes. Spleen: Normal in size without focal abnormality. Adrenals/Urinary Tract: Adrenal glands are normal. The kidneys are atrophic consistent with renal disease. No hydronephrosis or acute perinephric stranding. The ureters are normal. The bladder is poorly distended but unremarkable. Stomach/Bowel: The stomach and small bowel are unremarkable. The colon is normal. The appendix is unremarkable. Lymphatic: No significant vascular findings are present. No enlarged abdominal or pelvic lymph nodes. Reproductive: Prostate is unremarkable. Other: There is increased attenuation in the subcutaneous fat and in the intra-abdominal fat consistent volume overload. There is a small amount of fluid in the right pericolic gutter. Some of this fluid is adjacent to the appendix but there is no convincing evidence of appendicitis. Musculoskeletal: No acute or significant osseous findings. Review of the MIP images confirms the above findings. IMPRESSION: 1. 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 This is stable since 2018. No dissection. Atherosclerotic changes noted. Recommend annual imaging followup by CTA or MRA. This recommendation follows 2010 ACCF/AHA/AATS/ACR/ASA/SCA/SCAI/SIR/STS/SVM Guidelines for the Diagnosis and Management of Patients with Thoracic Aortic Disease. Circulation. 2010; 121: G269-S854. Aortic aneurysm NOS (ICD10-I71.9) 2. Cardiomegaly.  Coronary artery calcifications. 3. Increased attenuation in the subcutaneous fat diffusely as well as in the intra-abdominal fat with a small amount of fluid in the right pericolic gutter. These findings are most consistent with volume overload. 4. Tiny bilateral pleural effusions. Electronically Signed   By: Dorise Bullion III M.D   On: 08/18/2018 11:36    Consults: Treatment Team:  Minna Merritts, MD [facility], [facility], MD   Subjective:    Overnight Issues: Remains encephalopathic with intermittent agitation.   Requiring Precedex.  On CRRT,  tolerating.  Pressor requirement decreasing.  Persistent issues with lactic acidosis are now slowly clearing with CRRT.  Objective:  Vital signs for last 24 hours: Temp:  [96.5 F (35.8 C)-98.6 F (37 C)] 98.6 F (37 C) (06/17 1600) Pulse Rate:  [51-84] 53 (06/17 2000) Resp:  [9-21] 18 (06/17 2000) BP: (76-133)/(48-105) 92/59 (06/17 2000) SpO2:  [91 %-100 %] 93 % (06/17 2000) Weight:  [79.8 kg] 79.8 kg (06/17 0348)  Hemodynamic parameters for last 24 hours:    Intake/Output from previous day: 06/16 0701 - 06/17 0700 In: 2692.3 [P.O.:360; I.V.:1995.9; IV Piggyback:336.3] Out: 316   Intake/Output this shift: Total I/O In: -  Out: 24 [Other:24]  Vent settings for last 24 hours:    Physical Exam:  General:chronically ill appearing male,no respiratory distress, intermittently agitated Neuro:  Confused, follows commands when not agitated no distinct focal deficit HEENT: supple, no JVD , trachea midline no crepitus, right IJ dialysis catheter with clean dressing Cardiovascular: sinus bradycardia, no R/G Lungs:  Coarse breath sounds throughout, nonlabored Abdomen: +BS x4, soft, non tender, non distended  Musculoskeletal: trace bilateral lower extremity edema  Skin: Left upper arm av fistula positive for bruit and thrill   Required temporary dialysis catheter due to need for CRRT which cannot be performed via AV fistula  Assessment/Plan:   New onset paroxysmal atrial flutter converted to sinus bradycardia  Elevated troponin secondary to demand ischemia vs. NSTEMI  Cardiogenic vs. Septic Shock  Stable angina reported recent cocaine use  Hx: Cocaine Abuse, CAD, MI, Chronic combined systolic and diastolic CHF Continuous telemetry monitoring  Trend troponin's  Maintain map >60, pressors Continue midodrine, hydrocortisone - shock dose  Cardiology consulted appreciate input Will NOT start heparin drip per cardiology recommendations-pt not a  candidate for anticoagulation due to profound GI Bleed hx 3 months ago at [facility] that led to cardiac arrest Hold antiarrhythmic and antihypertensive medications for now  Continue atorvastatin Analgesia as needed Once mentation improves will provide cocaine abuse cessation counseling   ESRD on HD, now on CRRT Lactic acidosis Trend renal panel Replace electrolytes as indicated  Avoid nephrotoxic medications Discussed with Dr. Juleen China, nephrology  History of recent GI bleed  VTE px: SCD's,  Heparin as required by CRRT protocol Trend CBC  Monitor for s/sx of bleeding and transfuse for hgb <7  Bronchitis  Trend WBC and monitor fever curve Follow cultures  Continue cefepime will discontinue vancomycin Discontinue Flagyl as may aggravate agitation  Acute encephalopathy secondary to severe hypoglycemia and metabolic acidosis   Weaning off D10 CBG's q1hr for now  CRRT per Nephrology recommendations     LOS: 4 days   Additional comments:Multidisciplinary rounds were performed with ICU team  Critical Care Total Time*: 40 Minutes  C. Derrill Kay, MD [facility]  08/09/2018  *Care during the described time interval was provided by me and/or other providers on the critical care team.  I have reviewed this patient's available data, including medical history, events of note, physical examination and test results as part of my evaluation.

## 2018-08-09 NOTE — Progress Notes (Addendum)
CRITICAL VALUE ALERT  Critical Value: potassium of 7.0  Date & Time Notied:  08/09/18 02:53  Provider Notified: Tana Conch, NP  Orders Received/Actions taken: orders as seen in [facility] and will recheck potassium at 7:15 and then again at 8:15

## 2018-08-09 NOTE — Progress Notes (Signed)
Pharmacy CRRT Medication Monitoring and Adjustment Consult:  Pharmacy consulted to assist in monitoring and replacing electrolytes in this 63 y.o. male admitted on 08/17/2018 with Chest Pain   Labs:  Sodium (mmol/L)  Date Value  08/09/2018 135  04/30/2013 139   Potassium (mmol/L)  Date Value  08/09/2018 4.7  04/30/2013 4.4   Magnesium (mg/dL)  Date Value  08/09/2018 1.8  11/05/2012 1.5 (L)   Phosphorus (mg/dL)  Date Value  08/09/2018 8.2 (H)  04/30/2013 4.6   Calcium (mg/dL)  Date Value  08/09/2018 9.5   Calcium, Total (mg/dL)  Date Value  04/30/2013 8.0 (L)   Albumin (g/dL)  Date Value  08/09/2018 3.4 (L)  04/30/2013 2.6 (L)    Assessment/Plan: No further adjustments warranted at this time.   Pharmacy will continue to monitor and adjust per consult.   Simpson,Michael L 08/09/2018 2:18 PM

## 2018-08-09 NOTE — Progress Notes (Signed)
CRITICAL VALUE ALERT  Critical Value: Lactic of 5.6  Date & Time Notied:  08/09/18 05:45  Provider Notified: Tana Conch, NP  Orders Received/Actions taken: no new orders at this time

## 2018-08-09 NOTE — Progress Notes (Signed)
Pharmacy Antibiotic Note  [name] is a 63 y.o. male admitted on 08/06/2018 with cardiogenic shock.  Pharmacy has been consulted for cefepime/metronidazole dosing. Patient is not hemodynamically stable enough for IHD, so the plan is to continue CVVHD 0K bath. Patients Scr is improving. CrCl is 13.5. WBC trending up 9.9>12.7. Procalcitonin 4.89.  Plan: -Continue with cefepime 2g IV every 12 hours  -Continue metronidazole 500mg  IV every 8 hours. -Continue to monitor CrCl and SCr  Height: 5\' 8"  (172.7 cm) Weight: 175 lb 14.8 oz (79.8 kg) IBW/kg (Calculated) : 68.4  Temp (24hrs), Avg:97.5 F (36.4 C), Min:96.5 F (35.8 C), Max:98 F (36.7 C)  Recent Labs  Lab 07/26/2018 0710 08/01/2018 0746 08/06/18 0045  08/07/18 2152 08/08/18 0115 08/08/18 0957  08/08/18 1749 08/08/18 2235 08/09/18 0203 08/09/18 0323 08/09/18 0607 08/09/18 1002  WBC 7.3  --  5.9  --   --   --  9.9  --   --   --   --  12.7*  --   --   CREATININE 5.27*  --  6.14*   < >  --   --  8.88*   < > 7.41* 7.79* 6.91*  --  5.53* 5.36*  LATICACIDVEN  --  0.9  --   --  6.3* 6.6*  --   --   --   --  9.5*  --  5.6*  --    < > = values in this interval not displayed.    Estimated Creatinine Clearance: 13.5 mL/min (A) (by C-G formula based on SCr of 5.36 mg/dL (H)).    Allergies  Allergen Reactions  . Sulfa Antibiotics Shortness Of Breath, Itching and Rash    Antimicrobials this admission: 6/13 Cefepime 6/13 6/13 Metronidazole 6/13 6/13 Vancomycin 6/13 6/16 Cefepime >> 6/17 Metronidazole >>  Dose adjustments this admission: N/a   Microbiology results: 6/17 BCx: NG <12 hours 6/13 BCx: NG x4 days 6/13 MRSA PCR: Negative 6/13 COVID Negative  Thank you for allowing pharmacy to be a part of this patient's care.  Marisa Cyphers, PharmD Candidate 08/09/2018 11:27 AM

## 2018-08-10 DIAGNOSIS — Z515 Encounter for palliative care: Secondary | ICD-10-CM

## 2018-08-10 DIAGNOSIS — Z7189 Other specified counseling: Secondary | ICD-10-CM

## 2018-08-10 LAB — RENAL FUNCTION PANEL
Albumin: 3 g/dL — ABNORMAL LOW (ref 3.5–5.0)
Albumin: 2.9 g/dL — ABNORMAL LOW (ref 3.5–5.0)
Albumin: 2.9 g/dL — ABNORMAL LOW (ref 3.5–5.0)
Albumin: 2.7 g/dL — ABNORMAL LOW (ref 3.5–5.0)
Albumin: 2.7 g/dL — ABNORMAL LOW (ref 3.5–5.0)
Albumin: 2.8 g/dL — ABNORMAL LOW (ref 3.5–5.0)
Anion gap: 13 (ref 5–15)
Anion gap: 12 (ref 5–15)
Anion gap: 12 (ref 5–15)
Anion gap: 10 (ref 5–15)
Anion gap: 11 (ref 5–15)
Anion gap: 12 (ref 5–15)
BUN: 37 mg/dL — ABNORMAL HIGH (ref 8–23)
BUN: 36 mg/dL — ABNORMAL HIGH (ref 8–23)
BUN: 33 mg/dL — ABNORMAL HIGH (ref 8–23)
BUN: 32 mg/dL — ABNORMAL HIGH (ref 8–23)
BUN: 31 mg/dL — ABNORMAL HIGH (ref 8–23)
BUN: 32 mg/dL — ABNORMAL HIGH (ref 8–23)
CO2: 24 mmol/L (ref 22–32)
CO2: 24 mmol/L (ref 22–32)
CO2: 24 mmol/L (ref 22–32)
CO2: 25 mmol/L (ref 22–32)
CO2: 23 mmol/L (ref 22–32)
CO2: 24 mmol/L (ref 22–32)
Calcium: 8.7 mg/dL — ABNORMAL LOW (ref 8.9–10.3)
Calcium: 9 mg/dL (ref 8.9–10.3)
Calcium: 9 mg/dL (ref 8.9–10.3)
Calcium: 8.5 mg/dL — ABNORMAL LOW (ref 8.9–10.3)
Calcium: 8.9 mg/dL (ref 8.9–10.3)
Calcium: 8.7 mg/dL — ABNORMAL LOW (ref 8.9–10.3)
Chloride: 96 mmol/L — ABNORMAL LOW (ref 98–111)
Chloride: 98 mmol/L (ref 98–111)
Chloride: 98 mmol/L (ref 98–111)
Chloride: 95 mmol/L — ABNORMAL LOW (ref 98–111)
Chloride: 99 mmol/L (ref 98–111)
Chloride: 96 mmol/L — ABNORMAL LOW (ref 98–111)
Creatinine, Ser: 3.45 mg/dL — ABNORMAL HIGH (ref 0.61–1.24)
Creatinine, Ser: 3.37 mg/dL — ABNORMAL HIGH (ref 0.61–1.24)
Creatinine, Ser: 3.13 mg/dL — ABNORMAL HIGH (ref 0.61–1.24)
Creatinine, Ser: 2.84 mg/dL — ABNORMAL HIGH (ref 0.61–1.24)
Creatinine, Ser: 2.75 mg/dL — ABNORMAL HIGH (ref 0.61–1.24)
Creatinine, Ser: 2.62 mg/dL — ABNORMAL HIGH (ref 0.61–1.24)
GFR calc Af Amer: 21 mL/min — ABNORMAL LOW (ref >60)
GFR calc Af Amer: 21 mL/min — ABNORMAL LOW (ref >60)
GFR calc Af Amer: 23 mL/min — ABNORMAL LOW (ref >60)
GFR calc Af Amer: 26 mL/min — ABNORMAL LOW (ref >60)
GFR calc Af Amer: 27 mL/min — ABNORMAL LOW (ref >60)
GFR calc Af Amer: 29 mL/min — ABNORMAL LOW (ref >60)
GFR calc non Af Amer: 18 mL/min — ABNORMAL LOW (ref >60)
GFR calc non Af Amer: 18 mL/min — ABNORMAL LOW (ref >60)
GFR calc non Af Amer: 20 mL/min — ABNORMAL LOW (ref >60)
GFR calc non Af Amer: 22 mL/min — ABNORMAL LOW (ref >60)
GFR calc non Af Amer: 23 mL/min — ABNORMAL LOW (ref >60)
GFR calc non Af Amer: 25 mL/min — ABNORMAL LOW (ref >60)
Glucose, Bld: 204 mg/dL — ABNORMAL HIGH (ref 70–99)
Glucose, Bld: 199 mg/dL — ABNORMAL HIGH (ref 70–99)
Glucose, Bld: 192 mg/dL — ABNORMAL HIGH (ref 70–99)
Glucose, Bld: 188 mg/dL — ABNORMAL HIGH (ref 70–99)
Glucose, Bld: 183 mg/dL — ABNORMAL HIGH (ref 70–99)
Glucose, Bld: 181 mg/dL — ABNORMAL HIGH (ref 70–99)
Phosphorus: 6 mg/dL — ABNORMAL HIGH (ref 2.5–4.6)
Phosphorus: 5.9 mg/dL — ABNORMAL HIGH (ref 2.5–4.6)
Phosphorus: 5.5 mg/dL — ABNORMAL HIGH (ref 2.5–4.6)
Phosphorus: 5.5 mg/dL — ABNORMAL HIGH (ref 2.5–4.6)
Phosphorus: 5.3 mg/dL — ABNORMAL HIGH (ref 2.5–4.6)
Phosphorus: 5.3 mg/dL — ABNORMAL HIGH (ref 2.5–4.6)
Potassium: 3.6 mmol/L (ref 3.5–5.1)
Potassium: 3.5 mmol/L (ref 3.5–5.1)
Potassium: 3.4 mmol/L — ABNORMAL LOW (ref 3.5–5.1)
Potassium: 3.3 mmol/L — ABNORMAL LOW (ref 3.5–5.1)
Potassium: 3.7 mmol/L (ref 3.5–5.1)
Potassium: 3.6 mmol/L (ref 3.5–5.1)
Sodium: 133 mmol/L — ABNORMAL LOW (ref 135–145)
Sodium: 134 mmol/L — ABNORMAL LOW (ref 135–145)
Sodium: 134 mmol/L — ABNORMAL LOW (ref 135–145)
Sodium: 130 mmol/L — ABNORMAL LOW (ref 135–145)
Sodium: 133 mmol/L — ABNORMAL LOW (ref 135–145)
Sodium: 132 mmol/L — ABNORMAL LOW (ref 135–145)

## 2018-08-10 LAB — CBC WITH DIFFERENTIAL/PLATELET
Abs Immature Granulocytes: 0.08 10*3/uL — ABNORMAL HIGH (ref 0.00–0.07)
Basophils Absolute: 0 10*3/uL (ref 0.0–0.1)
Basophils Relative: 0 %
Eosinophils Absolute: 0 10*3/uL (ref 0.0–0.5)
Eosinophils Relative: 0 %
HCT: 32.6 % — ABNORMAL LOW (ref 39.0–52.0)
Hemoglobin: 10.6 g/dL — ABNORMAL LOW (ref 13.0–17.0)
Immature Granulocytes: 1 %
Lymphocytes Relative: 1 %
Lymphs Abs: 0.1 10*3/uL — ABNORMAL LOW (ref 0.7–4.0)
MCH: 27.7 pg (ref 26.0–34.0)
MCHC: 32.5 g/dL (ref 30.0–36.0)
MCV: 85.1 fL (ref 80.0–100.0)
Monocytes Absolute: 0.7 10*3/uL (ref 0.1–1.0)
Monocytes Relative: 5 %
Neutro Abs: 12.2 10*3/uL — ABNORMAL HIGH (ref 1.7–7.7)
Neutrophils Relative %: 93 %
Platelets: 118 10*3/uL — ABNORMAL LOW (ref 150–400)
RBC: 3.83 MIL/uL — ABNORMAL LOW (ref 4.22–5.81)
RDW: 19.5 % — ABNORMAL HIGH (ref 11.5–15.5)
WBC: 13.1 10*3/uL — ABNORMAL HIGH (ref 4.0–10.5)
nRBC: 0.4 % — ABNORMAL HIGH (ref 0.0–0.2)

## 2018-08-10 LAB — GLUCOSE, CAPILLARY
Glucose-Capillary: 163 mg/dL — ABNORMAL HIGH (ref 70–99)
Glucose-Capillary: 166 mg/dL — ABNORMAL HIGH (ref 70–99)
Glucose-Capillary: 170 mg/dL — ABNORMAL HIGH (ref 70–99)
Glucose-Capillary: 178 mg/dL — ABNORMAL HIGH (ref 70–99)
Glucose-Capillary: 189 mg/dL — ABNORMAL HIGH (ref 70–99)
Glucose-Capillary: 199 mg/dL — ABNORMAL HIGH (ref 70–99)
Glucose-Capillary: 208 mg/dL — ABNORMAL HIGH (ref 70–99)

## 2018-08-10 LAB — CULTURE, BLOOD (ROUTINE X 2)
Culture: NO GROWTH
Culture: NO GROWTH

## 2018-08-10 LAB — APTT: aPTT: 39 seconds — ABNORMAL HIGH (ref 24–36)

## 2018-08-10 LAB — MAGNESIUM
Magnesium: 1.7 mg/dL (ref 1.7–2.4)
Magnesium: 1.7 mg/dL (ref 1.7–2.4)
Magnesium: 2.1 mg/dL (ref 1.7–2.4)
Magnesium: 2.3 mg/dL (ref 1.7–2.4)
Magnesium: 2.1 mg/dL (ref 1.7–2.4)
Magnesium: 2.1 mg/dL (ref 1.7–2.4)

## 2018-08-10 LAB — LACTIC ACID, PLASMA: Lactic Acid, Venous: 1.2 mmol/L (ref 0.5–1.9)

## 2018-08-10 LAB — PROCALCITONIN: Procalcitonin: 4.25 ng/mL

## 2018-08-10 MED ORDER — PUREFLOW DIALYSIS SOLUTION
INTRAVENOUS | Status: DC
  Administered 2018-08-10: 3 via INTRAVENOUS_CENTRAL
  Administered 2018-08-10 – 2018-08-11 (×2): via INTRAVENOUS_CENTRAL
  Administered 2018-08-11 – 2018-08-13 (×5): 3 via INTRAVENOUS_CENTRAL

## 2018-08-10 MED ORDER — MAGNESIUM SULFATE 2 GM/50ML IV SOLN
2.0000 g | Freq: Once | INTRAVENOUS | Status: AC
  Administered 2018-08-10: 2 g via INTRAVENOUS
  Filled 2018-08-10: qty 50

## 2018-08-10 MED ORDER — PUREFLOW DIALYSIS SOLUTION
INTRAVENOUS | Status: DC
  Administered 2018-08-10: 09:00:00 3 via INTRAVENOUS_CENTRAL

## 2018-08-10 MED ORDER — INSULIN ASPART 100 UNIT/ML ~~LOC~~ SOLN: 0.0000 [IU] | SUBCUTANEOUS | Status: DC

## 2018-08-10 NOTE — Progress Notes (Signed)
CRRT treatment paused at 2000 for routine cartridge change. Blood flush back was successful and CRRT was resumed at 2049. Pt tolerating blood flow rate of 300, will continue to monitor.

## 2018-08-10 NOTE — Progress Notes (Signed)
[facility]  ROUNDING NOTE   Subjective:   CRRT 2K bath. UF 17mL/hr  UF 25.   Norepinephrine 46mcg  Objective:  Vital signs in last 24 hours:  Temp:  [95 F (35 C)-98.6 F (37 C)] 98.4 F (36.9 C) (06/18 0600) Pulse Rate:  [31-81] 31 (06/18 0600) Resp:  [8-20] 18 (06/18 0600) BP: (74-131)/(48-105) 98/56 (06/18 0600) SpO2:  [93 %-100 %] 99 % (06/18 0600) FiO2 (%):  [28 %] 28 % (06/18 0400) Weight:  [79.5 kg] 79.5 kg (06/18 0424)  Weight change: -0.3 kg Filed Weights   08/08/18 0439 08/09/18 0348 08/10/18 0424  Weight: 81.8 kg 79.8 kg 79.5 kg    Intake/Output: I/O last 3 completed shifts: In: 2486.5 [P.O.:380; I.V.:1525.2; IV Piggyback:581.3] Out: 844 [Other:844]   Intake/Output this shift:  No intake/output data recorded.  Physical Exam: General: Critically ill  Head: Normocephalic, atraumatic. Dry oral mucosal membranes  Eyes: Anicteric  Neck: Supple, trachea midline  Lungs:  Clear to auscultation, normal effort  Heart:  irregular, bradycardia  Abdomen:  Soft, nontender, bowel sounds present  Extremities: + peripheral edema.  Neurologic: Alert to self  Skin: No lesions  Access: LUE AVF +bruit and thrill, RIJ temp HD catheter 6/16 Dr. Mortimer Fries    Basic Metabolic Panel: Recent Labs  Lab 08/09/18 1325 08/09/18 1842 08/09/18 2327 08/10/18 0203 08/10/18 0431  NA 136 136 136 133* 134*  K 4.1 3.7 3.7 3.6 3.5  CL 97* 98 98 96* 98  CO2 22 24 25 24 24   GLUCOSE 160* 160* 192* 204* 199*  BUN 47* 40* 39* 37* 36*  CREATININE 4.31* 3.72* 3.49* 3.45* 3.37*  CALCIUM 9.5 8.8* 8.9 8.7* 9.0  MG 1.7 1.7 1.8 1.7 1.7  PHOS 6.2* 5.3* 5.5* 6.0* 5.9*    Liver Function Tests: Recent Labs  Lab 08/01/2018 0710 08/07/18 1513  08/09/18 1325 08/09/18 1842 08/09/18 2327 08/10/18 0203 08/10/18 0431  AST 26 51*  --   --   --   --   --   --   ALT 21 27  --   --   --   --   --   --   ALKPHOS 81 87  --   --   --   --   --   --   BILITOT 1.0 1.0  --   --   --   --    --   --   PROT 7.4 7.9  --   --   --   --   --   --   ALBUMIN 3.4* 3.4*   < > 3.3* 2.9* 3.0* 3.0* 2.9*   < > = values in this interval not displayed.   Recent Labs  Lab 08/07/18 1513  LIPASE 45   No results for input(s): AMMONIA in the last 168 hours.  CBC: Recent Labs  Lab 08/06/2018 0710 08/06/18 0045 08/07/18 2041 08/08/18 0957 08/09/18 0323 08/10/18 0431  WBC 7.3 5.9  --  9.9 12.7* 13.1*  NEUTROABS 4.5  --   --  7.8*  --  12.2*  HGB 10.1* 10.4* 11.7* 11.5* 10.5* 10.6*  HCT 31.6* 32.9*  --  37.6* 34.0* 32.6*  MCV 87.8 88.0  --  89.7 88.8 85.1  PLT 150 114*  --  108* 95* 118*    Cardiac Enzymes: Recent Labs  Lab 08/06/18 0045 08/06/18 0303 08/07/18 1855 08/08/18 0115 08/08/18 0957  TROPONINI 0.18* 0.22* 2.05* 1.92* 1.73*    BNP: Invalid input(s): POCBNP  CBG: Recent Labs  Lab 08/09/18 1958 08/09/18 2336 08/10/18 0220 08/10/18 0328 08/10/18 0748  GLUCAP 121* 171* 208* 189* 199*    Microbiology: Results for orders placed or performed during the hospital encounter of 08/20/2018  Blood culture (routine x 2)     Status: None   Collection Time: 08/10/2018  7:46 AM   Specimen: BLOOD  Result Value Ref Range Status   Specimen Description BLOOD RFA  Final   Special Requests   Final    BOTTLES DRAWN AEROBIC AND ANAEROBIC Blood Culture results may not be optimal due to an excessive volume of blood received in culture bottles   Culture   Final    NO GROWTH 5 DAYS Performed at [facility], [address], [place], [place] [address]    Report Status 08/10/2018 FINAL  Final  Blood culture (routine x 2)     Status: None   Collection Time: 07/24/2018  7:47 AM   Specimen: BLOOD  Result Value Ref Range Status   Specimen Description BLOOD R AC  Final   Special Requests   Final    BOTTLES DRAWN AEROBIC AND ANAEROBIC Blood Culture results may not be optimal due to an excessive volume of blood received in culture bottles   Culture   Final    NO GROWTH 5  DAYS Performed at [facility], [address], [place], [place] [address]    Report Status 08/10/2018 FINAL  Final  SARS Coronavirus 2 (CEPHEID- Performed in [place] hospital lab), Hosp Order     Status: None   Collection Time: 07/25/2018  8:34 AM   Specimen: Nasopharyngeal Swab  Result Value Ref Range Status   SARS Coronavirus 2 NEGATIVE NEGATIVE Final    Comment: (NOTE) If result is NEGATIVE SARS-CoV-2 target nucleic acids are NOT DETECTED. The SARS-CoV-2 RNA is generally detectable in upper and lower  respiratory specimens during the acute phase of infection. The lowest  concentration of SARS-CoV-2 viral copies this assay can detect is 250  copies / mL. A negative result does not preclude SARS-CoV-2 infection  and should not be used as the sole basis for treatment or other  patient management decisions.  A negative result may occur with  improper specimen collection / handling, submission of specimen other  than nasopharyngeal swab, presence of viral mutation(s) within the  areas targeted by this assay, and inadequate number of viral copies  (<250 copies / mL). A negative result must be combined with clinical  observations, patient history, and epidemiological information. If result is POSITIVE SARS-CoV-2 target nucleic acids are DETECTED. The SARS-CoV-2 RNA is generally detectable in upper and lower  respiratory specimens dur ing the acute phase of infection.  Positive  results are indicative of active infection with SARS-CoV-2.  Clinical  correlation with patient history and other diagnostic information is  necessary to determine patient infection status.  Positive results do  not rule out bacterial infection or co-infection with other viruses. If result is PRESUMPTIVE POSTIVE SARS-CoV-2 nucleic acids MAY BE PRESENT.   A presumptive positive result was obtained on the submitted specimen  and confirmed on repeat testing.  While 2019 novel coronavirus  (SARS-CoV-2)  nucleic acids may be present in the submitted sample  additional confirmatory testing may be necessary for epidemiological  and / or clinical management purposes  to differentiate between  SARS-CoV-2 and other Sarbecovirus currently known to infect humans.  If clinically indicated additional testing with an alternate test  methodology [phone]) is advised.  The SARS-CoV-2 RNA is generally  detectable in upper and lower respiratory sp ecimens during the acute  phase of infection. The expected result is Negative. Fact Sheet for Patients:  StrictlyIdeas.no Fact Sheet for Healthcare Providers: BankingDealers.co.za This test is not yet approved or cleared by the [place] FDA and has been authorized for detection and/or diagnosis of SARS-CoV-2 by FDA under an Emergency Use Authorization (EUA).  This EUA will remain in effect (meaning this test can be used) for the duration of the COVID-19 declaration under Section 564(b)(1) of the Act, 21 U.S.C. section 360bbb-3(b)(1), unless the authorization is terminated or revoked sooner. Performed at [facility], [address] [place], [place] [address]   MRSA PCR Screening     Status: None   Collection Time: 08/08/2018  1:50 PM   Specimen: Nasal Mucosa; Nasopharyngeal  Result Value Ref Range Status   MRSA by PCR NEGATIVE NEGATIVE Final    Comment:        The GeneXpert MRSA Assay (FDA approved for NASAL specimens only), is one component of a comprehensive MRSA colonization surveillance program. It is not intended to diagnose MRSA infection nor to guide or monitor treatment for MRSA infections. Performed at [facility], [address] [place], [place] [address]   CULTURE, BLOOD (ROUTINE X 2) w Reflex to ID Panel     Status: None (Preliminary result)   Collection Time: 08/09/18  3:24 AM   Specimen: BLOOD  Result Value Ref Range Status   Specimen Description BLOOD BLOOD  RIGHT ARM  Final   Special Requests   Final    BOTTLES DRAWN AEROBIC AND ANAEROBIC Blood Culture adequate volume   Culture   Final    NO GROWTH 1 DAY Performed at [facility], [address], [place], [place] [address]    Report Status PENDING  Incomplete  CULTURE, BLOOD (ROUTINE X 2) w Reflex to ID Panel     Status: None (Preliminary result)   Collection Time: 08/09/18  3:41 AM   Specimen: BLOOD  Result Value Ref Range Status   Specimen Description BLOOD RIGHT ANTECUBITAL  Final   Special Requests   Final    BOTTLES DRAWN AEROBIC ONLY Blood Culture results may not be optimal due to an inadequate volume of blood received in culture bottles   Culture   Final    NO GROWTH 1 DAY Performed at [facility], [address], [place], [place] [address]    Report Status PENDING  Incomplete    Coagulation Studies: No results for input(s): LABPROT, INR in the last 72 hours.  Urinalysis: No results for input(s): COLORURINE, LABSPEC, PHURINE, GLUCOSEU, HGBUR, BILIRUBINUR, KETONESUR, PROTEINUR, UROBILINOGEN, NITRITE, LEUKOCYTESUR in the last 72 hours.  Invalid input(s): APPERANCEUR    Imaging: Dg Shoulder 1v Right  Result Date: 08/09/2018 CLINICAL DATA:  Right shoulder pain. EXAM: RIGHT SHOULDER - 1 VIEW COMPARISON:  None. FINDINGS: Single AP view of the shoulder. No fracture. Alignment is maintained. Mild acromioclavicular spurring. No bony destructive change or periosteal reaction. Soft tissues are unremarkable. IMPRESSION: Single portable AP view the shoulder demonstrating mild acromioclavicular degenerative change. No other explanation for pain. Electronically Signed   By: Keith Rake M.D.   On: 08/09/2018 03:52   Dg Abd 1 View  Result Date: 08/09/2018 CLINICAL DATA:  Abdominal discomfort and worsening lactic acidosis. EXAM: ABDOMEN - 1 VIEW COMPARISON:  Abdominal CT 2 days ago. FINDINGS: No bowel dilatation to suggest obstruction. Small bowel loops in the left  abdomen with possible wall thickening. High-density material in the colon is seen on prior. No free air. Vascular coils in the central abdomen. There are vascular calcifications. IMPRESSION: Suspect wall thickening of small bowel loops in the left lower abdomen, possible enteritis. No obstruction or free air. Electronically Signed   By: Keith Rake M.D.   On: 08/09/2018 03:51   Dg Chest Port 1 View  Result Date: 08/09/2018 CLINICAL DATA:  Acute respiratory failure. EXAM: PORTABLE CHEST 1 VIEW COMPARISON:  Radiograph yesterday. FINDINGS: Tip of the right central line in the lower SVC. Vascular stent in the region of brachiocephalic unchanged cardiomegaly. Small bilateral pleural effusions, similar to prior. Improved right lung base atelectasis. No acute airspace disease. No pneumothorax. IMPRESSION: 1. Unchanged small bilateral pleural effusions. Improved right basilar atelectasis. 2. Stable cardiomegaly. Electronically Signed   By: Keith Rake M.D.   On: 08/09/2018 03:49   Dg Chest Port 1 View  Result Date: 08/08/2018 CLINICAL DATA:  Central line placement EXAM: PORTABLE CHEST 1 VIEW COMPARISON:  08/09/2018 FINDINGS: The patient is status post placement of a right-sided central venous catheter. The tip is well position near the cavoatrial junction. The heart size remains enlarged. Aortic calcifications are noted. A vascular stent projects over the left brachiocephalic vein. There are small bilateral pleural effusions, left worse than right. There is an airspace opacity at the right lung base favored to represent atelectasis, however an infiltrate or aspiration is not excluded. There is no pneumothorax. IMPRESSION: 1. Well-positioned right-sided central venous catheter. No pneumothorax. 2. Cardiomegaly with persistent small bilateral pleural effusions and generalized volume overload. Electronically Signed   By: Constance Holster M.D.   On: 08/08/2018 16:41     Medications:   . sodium chloride     . ceFEPime (MAXIPIME) IV Stopped (08/09/18 2151)  . dexmedetomidine (PRECEDEX) IV infusion 0.6 mcg/kg/hr (08/10/18 0348)  . dextrose 20 mL/hr at 08/10/18 0238  . magnesium sulfate bolus IVPB 2 g (08/10/18 0844)  . metronidazole 500 mg (08/10/18 0423)  . norepinephrine (LEVOPHED) 16mg  / 260mL infusion 2 mcg/min (08/10/18 0057)  . phenylephrine (NEO-SYNEPHRINE) Adult infusion Stopped (08/08/18 1700)  . pureflow    . vasopressin (PITRESSIN) infusion - *FOR SHOCK* Stopped (08/09/18 0216)   . aspirin EC  81 mg Oral Daily  . atorvastatin  40 mg Oral Daily  . calcium acetate  2,001 mg Oral TID WC  . cinacalcet  30 mg Oral 3 times weekly  . docusate sodium  100 mg Oral Daily  . hydrocortisone sod succinate (SOLU-CORTEF) inj  50 mg Intravenous Q6H  . mouth rinse  15 mL Mouth Rinse BID  . midodrine  10 mg Oral TID WC  . pantoprazole (PROTONIX) IV  40 mg Intravenous Q12H  . sodium chloride flush  10-40 mL Intracatheter Q12H  . sodium chloride flush  3 mL Intravenous Q12H   sodium chloride, acetaminophen **OR** acetaminophen, diphenhydrAMINE, guaiFENesin, haloperidol lactate, heparin, hydrOXYzine, ipratropium-albuterol, morphine injection, nitroGLYCERIN, ondansetron **OR** ondansetron (ZOFRAN) IV, oxyCODONE-acetaminophen, senna-docusate, sodium chloride flush, sodium chloride flush  Assessment/ Plan:  [name] is a 63 y.o. black male with end stage renal disease on hemodialysis, hypertension, hepatitis C, anemia, seizure disorder, COPD/tobacco abuse,substance abuse (cocaine) and alcohol abuse,secondary hyperparathyroidism, chronic systolic congestive heart failure admitted with chest pain and atrial flutter. Complicated by bradycardia. Transferred to ICU on 6/15 for worsening status, hypoglycemia, hypotension, sepsis and hyperkalemia.   [facility] [address]Left arm AVG. 240 minutes 72kg  1.  ESRD on HD  MWF.  With hyponatremia, hyperkalemia and metabolic acidosis Requiring  vasopressors: currently on norepinephrine and weaned off vasopressin. Not hemodynamically stable for intermittent hemodialysis treatment.  - Continue CVVHD 2K bath  2.  Anemia of chronic kidney disease.  Hemoglobin 10.6 Holding EPO due to concerns of dissection/ischemia.   3.  Secondary hyperparathyroidism with hyperphophatemia. PTH elevated at 1178 on 07/31/2018 - home regimen of calcium acetate. Takes cinacalcet on dialysis days.    4.  Shock with hypotension: cardiogenic with Chest pain/atrial flutter/bradycardia.   Outpatient baseline blood pressures are 90s/60s.  With demand ischemia. However also with elevated lactic acid level and procalcitonin levels. Concern for underlying sepsis.  - Empiric cefepime and metronidazole.  - Weaing off vasopressors.   5. Diabetes mellitus type II with chronic kidney disease: with prolonged hypoglycemia - D10W infusion.   LOS: 5 Everlean Bucher 6/18/20208:56 AM

## 2018-08-10 NOTE — Progress Notes (Signed)
Pharmacy CRRT Medication Monitoring and Adjustment Consult:  Pharmacy consulted to assist in monitoring and replacing electrolytes in this 63 y.o. male admitted on 07/27/2018 with Chest Pain   Labs:  Sodium (mmol/L)  Date Value  08/10/2018 130 (L)  04/30/2013 139   Potassium (mmol/L)  Date Value  08/10/2018 3.3 (L)  04/30/2013 4.4   Magnesium (mg/dL)  Date Value  08/10/2018 2.3  11/05/2012 1.5 (L)   Phosphorus (mg/dL)  Date Value  08/10/2018 5.5 (H)  04/30/2013 4.6   Calcium (mg/dL)  Date Value  08/10/2018 8.5 (L)   Calcium, Total (mg/dL)  Date Value  04/30/2013 8.0 (L)   Albumin (g/dL)  Date Value  08/10/2018 2.7 (L)  04/30/2013 2.6 (L)   Corrected Calcium 9.5  Assessment: -Patient on CRRT 4K bath. Hypokalemic at 3.3 -Phosphorus elevated but patient is taking phosphate binders -Magnesium is within normal limits -Hyponatremic at 130--patient on fluid restriction of 129mL, Sodium is trending down    Plan: No adjustments at this time.  Pharmacy will continue to monitor and adjust per consult.   Marisa Cyphers, PharmD Candidate 08/10/2018 2:53 PM

## 2018-08-10 NOTE — Progress Notes (Signed)
This morning patient was very agitated, combative, and restless. Trying to get out the bed, yelling and screaming. Tried to calm patient, he stated that he "hurt", but did not tell RN where. Morphine given for pain and precedex titrated up to help with agitation. Since this episode patient has been resting comfortably, will continue to monitor. Lupita Leash

## 2018-08-10 NOTE — Consult Note (Signed)
Consultation Note Date: 08/10/2018   Patient Name: [name]  DOB: 04/17/1955  MRN: [id number]  Age / Sex: 63 y.o., male  PCP: Donnie Coffin, MD Referring Physician: Henreitta Leber, MD  Reason for Consultation: Establishing goals of care  HPI/Patient Profile: Elbie Statzer is an 63 y.o. male  with a PMH of ESRD on HD (M-W-F), PVC's, Pulmonary HTN,Dyspnea,PVD, Myocardial Infarction, CHF, HTN, Type II Diabetes Mellitus, Chronic Combined Systolic and Diastolic CHF,Chronic Home O2 @2L ,CAD, HepatitisC, and Arthritis. He presented to [facility] ER on 06/13 via EMS with c/o chest pain worse with coughing and shortness of breath.  He was noted to have troponin elevation due to demand ischemia. He was deemed not a candidate for anticoagulation due to prior massive GIB.   Clinical Assessment and Goals of Care: Patient is resting in bed with eyes closed. He is on CRRT.   Spoke with son [place]. He states when his father is unable to make decisions, he and his sister make them together. He states since D/C from [facility], his father has been living with him "because he thinks his house is spooky".   He states functionally, his father has been doing okay. He states his father uses a cane, and has had a good appetite. They enjoy fishing together.    We discussed his diagnosis, prognosis, GOC, EOL wishes disposition and options.  A detailed discussion was had today regarding advanced directives.  Concepts specific to code status, artifical feeding and hydration,  and rehospitalization were discussed.  The difference between an aggressive medical intervention path and a comfort care path was discussed.  Values and goals of care important to patient and family were attempted to be elicited.  Discussed limitations of medical interventions to prolong quality of life in some situations and discussed the concept of human mortality.   He states he understands his father is sick. He states they have never had conversations regarding [place], but he states he has urged his father to get a life insurance policy to help pay for his burial, but he states it becomes a joke. He states his mother, the patient's exwife, has decided to be a DNR, but not his father. He states to "try to revive him and do what you can." He tells me about his father's history of cocaine use and how this has come between them in the past. He states he realizes this has affected his health.  Plans to attempt a family GOC if patient becomes oriented. Encouraged Mr. Landini to speak with his sister regarding their father's wishes for care if he is unable to relate them to [place].   Attempted to call daughter Janeal Holmes unsuccessfully.    SUMMARY OF RECOMMENDATIONS   Will continue to follow.  Recommend palliative to follow at D/C. Full code/full scope.    Prognosis:   Poor overall  Discharge Planning: To Be Determined      Primary Diagnoses: Present on Admission: . Hypotension   I have reviewed the medical record, interviewed the patient and  family, and examined the patient. The following aspects are pertinent.  Past Medical History:  Diagnosis Date  . Arthritis   . CAD (coronary artery disease)    a. 09/2013 Myoview [facility]): basal inf defect more pronounced @ rest, likely artifact, EF 48%, low risk study; b. 06/2016 Cath [facility]): LAD 10p, 30-6m, 40-50d, D1 90(small), D2 40, D3 60-70, D4 30, LCX 10p, 62m, 20d, Om1 30, OM3 40, RCA 100p w/ bridging collats, m/d RCA fill via collats, small/mod caliber-->Med Rx; c. 02/2017 MV: inf infarct w/ peri-inf ischemia. EF <30%.   . Chronic combined systolic and diastolic CHF (congestive heart failure) ([place])    a. 10/2013 Echo [facility]): EF 50%, diast dysfxn;  b. 10/2014 Echo: EF 30-35%, diff HK, gr1 DD;  c. 04/2016 Echo: EF 20-25%, sev dil LV, diff HK, gr3 DD; d. 06/2016 Echo: EF 30-35%, Gr2 DD.  . Diabetes ([place])   . Dyspnea   .  ESRD (end stage renal disease) ([place])    a. MWF dialysis @ [facility].  . Hepatitis    Patient is unsure type of Hepatitis he has  . Hypertension   . Hypertensive heart disease with CHF (congestive heart failure) ([place])   . Mixed Ischemic and non-ischemic cardiomyopathy    a. 10/2013 Echo [facility]): EF 50%, diast dysfxn;  b. 10/2014 Echo: EF 30-35%, diff HK, gr1 DD;  c. 04/2016 Echo: EF 20-25%, sev dil LV, diff HK, gr3 DD; d. 06/2016 Echo: EF 30-35%, Gr2 DD.  [name] Moderate Aortic insufficiency    a. 04/2016 Echo: Mod AI.  [name] Myocardial infarction ([place])   . Peripheral vascular disease ([place])   . Pulmonary hypertension ([place])    a. 10/2014 Echo: Mod-Sev PAH.  [name] PVC's (premature ventricular contractions)    a. 02/2017 24 hr holter: freq polymorphic PVCs w/ total of 38K beats in 24 hrs (34% burden).  . Renal insufficiency    Social History   Socioeconomic History  . Marital status: Widowed    Spouse name: Not on file  . Number of children: Not on file  . Years of education: Not on file  . Highest education level: Not on file  Occupational History  . Not on file  Social Needs  . Financial resource strain: Not on file  . Food insecurity    Worry: Not on file    Inability: Not on file  . Transportation needs    Medical: Not on file    Non-medical: Not on file  Tobacco Use  . Smoking status: Former Smoker    Packs/day: 0.25    Years: 40.00    Pack years: 10.00    Types: Cigarettes    Quit date: 10/12/2016    Years since quitting: 1.8  . Smokeless tobacco: Never Used  Substance and Sexual Activity  . Alcohol use: No    Alcohol/week: 0.0 standard drinks  . Drug use: Yes    Types: Cocaine, Marijuana    Comment: patient states no drugs  . Sexual activity: Not Currently  Lifestyle  . Physical activity    Days per week: Not on file    Minutes per session: Not on file  . Stress: Not on file  Relationships  . Social Herbalist on phone: Not on file    Gets together: Not on  file    Attends religious service: Not on file    Active member of club or organization: Not on file    Attends meetings of clubs or organizations:  Not on file    Relationship status: Not on file  Other Topics Concern  . Not on file  Social History Narrative   Lives at home in [place] by himself.   Independent on ambulation but does not routinely exercise.   Family History  Problem Relation Age of Onset  . Hypertension Son   . Diabetes Son   . Hypertension Mother   . Diabetes Mother   . Diabetes Sister    Scheduled Meds: . aspirin EC  81 mg Oral Daily  . atorvastatin  40 mg Oral Daily  . calcium acetate  2,001 mg Oral TID WC  . cinacalcet  30 mg Oral 3 times weekly  . docusate sodium  100 mg Oral Daily  . hydrocortisone sod succinate (SOLU-CORTEF) inj  50 mg Intravenous Q6H  . mouth rinse  15 mL Mouth Rinse BID  . midodrine  10 mg Oral TID WC  . pantoprazole (PROTONIX) IV  40 mg Intravenous Q12H  . sodium chloride flush  10-40 mL Intracatheter Q12H  . sodium chloride flush  3 mL Intravenous Q12H   Continuous Infusions: . sodium chloride    . ceFEPime (MAXIPIME) IV Stopped (08/10/18 1157)  . dexmedetomidine (PRECEDEX) IV infusion 0.6 mcg/kg/hr (08/10/18 1308)  . norepinephrine (LEVOPHED) 16mg  / 216mL infusion 1 mcg/min (08/10/18 1308)  . phenylephrine (NEO-SYNEPHRINE) Adult infusion Stopped (08/08/18 1700)  . pureflow 3 each (08/10/18 1426)  . vasopressin (PITRESSIN) infusion - *FOR SHOCK* Stopped (08/09/18 0216)   PRN Meds:.sodium chloride, acetaminophen **OR** acetaminophen, diphenhydrAMINE, guaiFENesin, haloperidol lactate, heparin, hydrOXYzine, ipratropium-albuterol, morphine injection, nitroGLYCERIN, ondansetron **OR** ondansetron (ZOFRAN) IV, oxyCODONE-acetaminophen, senna-docusate, sodium chloride flush, sodium chloride flush Medications Prior to Admission:  Prior to Admission medications   Medication Sig Start Date End Date Taking? Authorizing Provider   atorvastatin (LIPITOR) 40 MG tablet Take 40 mg by mouth daily. 07/26/18  Yes [provider]  carvedilol (COREG) 3.125 MG tablet Take 3.125 mg by mouth 2 (two) times a day. 07/27/18  Yes [provider]  ENTRESTO 24-26 MG Take 1 tablet by mouth 2 (two) times a day. 07/26/18  Yes [provider]  furosemide (LASIX) 40 MG tablet Take 40 mg by mouth 2 (two) times a day. 07/26/18  Yes [provider]  hydrALAZINE (APRESOLINE) 100 MG tablet Take 100 mg by mouth daily. 07/26/18  Yes [provider]  isosorbide mononitrate (IMDUR) 30 MG 24 hr tablet Take 30 mg by mouth daily. 07/26/18  Yes [provider]  pantoprazole (PROTONIX) 40 MG tablet Take 40 mg by mouth 2 (two) times a day. 07/26/18  Yes [provider]  traMADol (ULTRAM) 50 MG tablet Take 1 tablet (50 mg total) by mouth every 6 (six) hours as needed for moderate pain or severe pain. 04/21/18  Yes Gouru, Illene Silver, MD  vitamin C (VITAMIN C) 500 MG tablet Take 1 tablet (500 mg total) by mouth 2 (two) times daily. 05/10/18  Yes Darel Hong D, NP  ceFEPIme 500 mg in dextrose 5 % 50 mL Inject 500 mg into the vein daily. 05/11/18   Bradly Bienenstock, NP  chlorhexidine (PERIDEX) 0.12 % solution 15 mLs by Mouth Rinse route 2 (two) times daily. 05/10/18   Bradly Bienenstock, NP  Chlorhexidine Gluconate Cloth 2 % PADS Apply 6 each topically daily. 05/10/18   Bradly Bienenstock, NP  epoetin alfa (EPOGEN,PROCRIT) 41740 UNIT/ML injection Inject 0.4 mLs (4,000 Units total) into the vein every Monday, Wednesday, and Friday with hemodialysis. 05/10/18  Darel Hong D, NP  haloperidol lactate (HALDOL) 5 MG/ML injection Inject 0.2 mLs (1 mg total) into the vein every 6 (six) hours as needed (for agitated delirium). 05/10/18   Darel Hong D, NP  ipratropium-albuterol (DUONEB) 0.5-2.5 (3) MG/3ML SOLN Take 3 mLs by nebulization every 4 (four) hours as needed. 05/10/18   Bradly Bienenstock, NP  LORazepam (ATIVAN) 2 MG/ML  injection Inject 0.25 mLs (0.5 mg total) into the vein every 4 (four) hours as needed for anxiety. 05/10/18   Darel Hong D, NP  morphine 2 MG/ML injection Inject 0.5 mLs (1 mg total) into the vein every 2 (two) hours as needed (chest pain). 05/10/18   Bradly Bienenstock, NP  mouth rinse LIQD solution 15 mLs by Mouth Rinse route 2 times daily at 12 noon and 4 pm. 05/10/18   Bradly Bienenstock, NP  nitroGLYCERIN (NITROSTAT) 0.4 MG SL tablet Place 1 tablet (0.4 mg total) under the tongue every 5 (five) minutes as needed for chest pain. 05/10/18   Bradly Bienenstock, NP  ondansetron (ZOFRAN) 4 MG/2ML SOLN injection Inject 2 mLs (4 mg total) into the vein every 6 (six) hours as needed for nausea. 05/10/18   Bradly Bienenstock, NP  ondansetron [facility]) 4 MG/2ML SOLN injection Inject 2 mLs (4 mg total) into the vein every 6 (six) hours as needed for nausea, vomiting or refractory nausea / vomiting. 05/10/18   Bradly Bienenstock, NP  pantoprazole (PROTONIX) 40 MG injection Inject 40 mg into the vein every 12 (twelve) hours. 05/13/18   Darel Hong D, NP  pantoprazole 80 mg in sodium chloride 0.9 % 250 mL Inject 8 mg/hr into the vein continuous. 05/10/18   Darel Hong D, NP  phenylephrine 10 mg in sodium chloride 0.9 % 250 mL Inject 0-400 mcg/min into the vein continuous. 05/10/18   Bradly Bienenstock, NP  Vancomycin [facility]) 750-5 MG/150ML-% SOLN Inject 150 mLs (750 mg total) into the vein every Monday, Wednesday, and Friday with hemodialysis. 05/10/18   Bradly Bienenstock, NP   Allergies  Allergen Reactions  . Sulfa Antibiotics Shortness Of Breath, Itching and Rash   Review of Systems  Unable to perform ROS   Physical Exam Constitutional:      Comments: Eyes closed.   Pulmonary:     Effort: Pulmonary effort is normal.  Skin:    General: Skin is warm and dry.     Vital Signs: BP (!) 87/59   Pulse 69   Temp 98.1 F (36.7 C) (Rectal)   Resp 14   Ht 5\' 8"  (1.727 m)   Wt 79.5 kg   SpO2 100%    BMI 26.65 kg/m  Pain Scale: CPOT POSS *See Group Information*: S-Acceptable,Sleep, easy to arouse Pain Score: Asleep   SpO2: SpO2: 100 % O2 Device:SpO2: 100 % O2 Flow Rate: .O2 Flow Rate (L/min): 3 L/min  IO: Intake/output summary:   Intake/Output Summary (Last 24 hours) at 08/10/2018 1448 Last data filed at 08/10/2018 1403 Gross per 24 hour  Intake 1206.08 ml  Output 388 ml  Net 818.08 ml    LBM: Last BM Date: 08/06/18 Baseline Weight: Weight: 77.1 kg Most recent weight: Weight: 79.5 kg     Palliative Assessment/Data:     Time In: 2:15 Time Out: 3:05 Time Total: 50 min Greater than 50%  of this time was spent counseling and coordinating care related to the above assessment and plan.  Signed by: Asencion Gowda, NP   Please contact Palliative Medicine  Team phone at [phone] for questions and concerns.  For individual provider: See Shea Evans

## 2018-08-10 NOTE — Progress Notes (Signed)
Pharmacy Antibiotic Note  [name] is a 63 y.o. male admitted on 08/14/2018 with cardiogenic shock.  Pharmacy has been consulted for cefepime/metronidazole dosing. Patient is not hemodynamically stable enough for IHD, so the plan is to continue CVVHD 2K bath. Patients Scr is improving. CrCl is 13.5. WBC trending up 9.9>12.7>13.1. Procalcitonin 4.89>4.25. Patient is on stress dose steroids. Cefepime dose will need to be adjusted once patient is stable enough for IHD MWF.  Plan: -Continue with cefepime 2g IV every 12 hours  -Stop metronidazole per discussion with MD during AM rounds  Height: 5\' 8"  (172.7 cm) Weight: 175 lb 4.3 oz (79.5 kg) IBW/kg (Calculated) : 68.4  Temp (24hrs), Avg:97.2 F (36.2 C), Min:95 F (35 C), Max:98.6 F (37 C)  Recent Labs  Lab 08/22/2018 0710  08/06/18 0045  08/07/18 2152 08/08/18 0115 08/08/18 0957  08/09/18 0203 08/09/18 0323 08/09/18 7124  08/09/18 2327 08/10/18 0203 08/10/18 0431 08/10/18 0843 08/10/18 1305  WBC 7.3  --  5.9  --   --   --  9.9  --   --  12.7*  --   --   --   --  13.1*  --   --   CREATININE 5.27*  --  6.14*   < >  --   --  8.88*   < > 6.91*  --  5.53*   < > 3.49* 3.45* 3.37* 3.13* 2.84*  LATICACIDVEN  --    < >  --   --  6.3* 6.6*  --   --  9.5*  --  5.6*  --   --   --   --   --  1.2   < > = values in this interval not displayed.    Estimated Creatinine Clearance: 25.4 mL/min (A) (by C-G formula based on SCr of 2.84 mg/dL (H)).    Allergies  Allergen Reactions  . Sulfa Antibiotics Shortness Of Breath, Itching and Rash    Antimicrobials this admission: 6/13 Cefepime 6/13 6/13 Metronidazole 6/13 6/13 Vancomycin 6/13 6/17 Metronidazole >>6/18 6/16 Cefepime >>  Dose adjustments this admission: -Stop Metronidazole per discussion with MD during rounds.  Microbiology results: 6/17 BCx: NG x1 day 6/13 BCx: NG x5 days 6/13 MRSA PCR: Negative 6/13 COVID Negative  Thank you for allowing pharmacy to be a part of this  patient's care.  Marisa Cyphers, PharmD Candidate 08/10/2018 2:44 PM

## 2018-08-10 NOTE — Progress Notes (Signed)
Progress Note  Patient Name: [name] Date of Encounter: 08/10/2018  Primary Cardiologist: Nelva Bush, MD   Subjective    lethargy this morning, Poor historian Telemetry normal sinus rhythm through all of yesterday, converting to atrial flutter around 9:20 AM On CRRT  Inpatient Medications    Scheduled Meds:  aspirin EC  81 mg Oral Daily   atorvastatin  40 mg Oral Daily   calcium acetate  2,001 mg Oral TID WC   cinacalcet  30 mg Oral 3 times weekly   docusate sodium  100 mg Oral Daily   hydrocortisone sod succinate (SOLU-CORTEF) inj  50 mg Intravenous Q6H   mouth rinse  15 mL Mouth Rinse BID   midodrine  10 mg Oral TID WC   pantoprazole (PROTONIX) IV  40 mg Intravenous Q12H   sodium chloride flush  10-40 mL Intracatheter Q12H   sodium chloride flush  3 mL Intravenous Q12H   Continuous Infusions:  sodium chloride     ceFEPime (MAXIPIME) IV Stopped (08/09/18 2151)   dexmedetomidine (PRECEDEX) IV infusion 1 mcg/kg/hr (08/10/18 1000)   norepinephrine (LEVOPHED) 16mg  / 214mL infusion 2 mcg/min (08/10/18 1000)   phenylephrine (NEO-SYNEPHRINE) Adult infusion Stopped (08/08/18 1700)   pureflow 3 each (08/10/18 0919)   vasopressin (PITRESSIN) infusion - *FOR SHOCK* Stopped (08/09/18 0216)   PRN Meds: sodium chloride, acetaminophen **OR** acetaminophen, diphenhydrAMINE, guaiFENesin, haloperidol lactate, heparin, hydrOXYzine, ipratropium-albuterol, morphine injection, nitroGLYCERIN, ondansetron **OR** ondansetron (ZOFRAN) IV, oxyCODONE-acetaminophen, senna-docusate, sodium chloride flush, sodium chloride flush   Vital Signs    Vitals:   08/10/18 0500 08/10/18 0600 08/10/18 0800 08/10/18 0900  BP: (!) 107/57 (!) 98/56 100/68 99/60  Pulse: (!) 58 (!) 31 63 (!) 58  Resp: 16 18 14  (!) 9  Temp: 98.2 F (36.8 C) 98.4 F (36.9 C)    TempSrc: Rectal Rectal    SpO2: 100% 99% 100% 99%  Weight:      Height:        Intake/Output Summary (Last 24 hours)  at 08/10/2018 1110 Last data filed at 08/10/2018 1000 Gross per 24 hour  Intake 1349.51 ml  Output 460 ml  Net 889.51 ml   Last 3 Weights 08/10/2018 08/09/2018 08/08/2018  Weight (lbs) 175 lb 4.3 oz 175 lb 14.8 oz 180 lb 5.4 oz  Weight (kg) 79.5 kg 79.8 kg 81.8 kg      Telemetry    Atrial flutter-Personally Reviewed  ECG      Physical Exam   GEN:  No significant distress, lethargic Neck: No obvious JVD. Cardiac:  Tachycardic, regular no murmurs. Respiratory:  Scattered Rales no wheezes or crackles. GI: Soft, nontender, non-distended  MS: No edema; No deformity. Neuro:  Nonfocal .  Unable to fully test Psych: [place]  Lab 08/21/2018 0710  08/07/18 1513  08/10/18 0203 08/10/18 0431 08/10/18 0843  NA 141   < >  --    < > 133* 134* 134*  K 4.4   < >  --    < > 3.6 3.5 3.4*  CL 105   < >  --    < > 96* 98 98  CO2 23   < >  --    < > 24 24 24   GLUCOSE 78   < >  --    < > 204* 199* 192*  BUN 45*   < >  --    < > 37* 36* 33*  CREATININE 5.27*   < >  --    < >  3.45* 3.37* 3.13*  CALCIUM 8.3*   < >  --    < > 8.7* 9.0 9.0  PROT 7.4  --  7.9  --   --   --   --   ALBUMIN 3.4*  --  3.4*   < > 3.0* 2.9* 2.9*  AST 26  --  51*  --   --   --   --   ALT 21  --  27  --   --   --   --   ALKPHOS 81  --  87  --   --   --   --   BILITOT 1.0  --  1.0  --   --   --   --   GFRNONAA 11*   < >  --    < > 18* 18* 20*  GFRAA 12*   < >  --    < > 21* 21* 23*  ANIONGAP 13   < >  --    < > 13 12 12    < > = values in this interval not displayed.     Hematology Recent Labs  Lab 08/08/18 0957 08/09/18 0323 08/10/18 0431  WBC 9.9 12.7* 13.1*  RBC 4.19* 3.83* 3.83*  HGB 11.5* 10.5* 10.6*  HCT 37.6* 34.0* 32.6*  MCV 89.7 88.8 85.1  MCH 27.4 27.4 27.7  MCHC 30.6 30.9 32.5  RDW 19.7* 19.3* 19.5*  PLT 108* 95* 118*    Cardiac Enzymes Recent Labs  Lab 08/06/18 0303 08/07/18 1855 08/08/18 0115 08/08/18 0957  TROPONINI 0.22* 2.05* 1.92* 1.73*   No  results for input(s): TROPIPOC in the last 168 hours.   BNP Recent Labs  Lab 07/29/2018 0710  BNP 2,373.0*     DDimer No results for input(s): DDIMER in the last 168 hours.   Radiology    Dg Shoulder 1v Right  Result Date: 08/09/2018 CLINICAL DATA:  Right shoulder pain. EXAM: RIGHT SHOULDER - 1 VIEW COMPARISON:  None. FINDINGS: Single AP view of the shoulder. No fracture. Alignment is maintained. Mild acromioclavicular spurring. No bony destructive change or periosteal reaction. Soft tissues are unremarkable. IMPRESSION: Single portable AP view the shoulder demonstrating mild acromioclavicular degenerative change. No other explanation for pain. Electronically Signed   By: Keith Rake M.D.   On: 08/09/2018 03:52   Dg Abd 1 View  Result Date: 08/09/2018 CLINICAL DATA:  Abdominal discomfort and worsening lactic acidosis. EXAM: ABDOMEN - 1 VIEW COMPARISON:  Abdominal CT 2 days ago. FINDINGS: No bowel dilatation to suggest obstruction. Small bowel loops in the left abdomen with possible wall thickening. High-density material in the colon is seen on prior. No free air. Vascular coils in the central abdomen. There are vascular calcifications. IMPRESSION: Suspect wall thickening of small bowel loops in the left lower abdomen, possible enteritis. No obstruction or free air. Electronically Signed   By: Keith Rake M.D.   On: 08/09/2018 03:51   Dg Chest Port 1 View  Result Date: 08/09/2018 CLINICAL DATA:  Acute respiratory failure. EXAM: PORTABLE CHEST 1 VIEW COMPARISON:  Radiograph yesterday. FINDINGS: Tip of the right central line in the lower SVC. Vascular stent in the region of brachiocephalic unchanged cardiomegaly. Small bilateral pleural effusions, similar to prior. Improved right lung base atelectasis. No acute airspace disease. No pneumothorax. IMPRESSION: 1. Unchanged small bilateral pleural effusions. Improved right basilar atelectasis. 2. Stable cardiomegaly. Electronically Signed    By: Keith Rake M.D.   On: 08/09/2018 03:49  Dg Chest Port 1 View  Result Date: 08/08/2018 CLINICAL DATA:  Central line placement EXAM: PORTABLE CHEST 1 VIEW COMPARISON:  07/25/2018 FINDINGS: The patient is status post placement of a right-sided central venous catheter. The tip is well position near the cavoatrial junction. The heart size remains enlarged. Aortic calcifications are noted. A vascular stent projects over the left brachiocephalic vein. There are small bilateral pleural effusions, left worse than right. There is an airspace opacity at the right lung base favored to represent atelectasis, however an infiltrate or aspiration is not excluded. There is no pneumothorax. IMPRESSION: 1. Well-positioned right-sided central venous catheter. No pneumothorax. 2. Cardiomegaly with persistent small bilateral pleural effusions and generalized volume overload. Electronically Signed   By: Constance Holster M.D.   On: 08/08/2018 16:41    Cardiac Studies   Echocardiogram (08/06/2018):  1. The left ventricle has normal systolic function, with an ejection fraction of 55%. The cavity size was normal. There is mildly increased left ventricular wall thickness.  2. The right ventricle has moderately reduced systolic function. The cavity was moderately enlarged. There is no increase in right ventricular wall thickness. Right ventricular systolic pressure is mild to moderately elevated with an estimated pressure  of 43.6 mmHg.  3. Left atrial size was moderately dilated.  4. Right atrial size was moderately dilated.  5. Mitral valve regurgitation is moderate The MR jet is eccentric posteriorly directed.Unable to exclude mild prolapse of the anterior leaflet.  6. Tricuspid valve regurgitation is severe.  7. Pulmonic valve regurgitation is mild to moderate  8. Rhythm is atrial flutter  Patient Profile     63 y.o. male with a history of ischemic and nonischemic cardiomyopathy, CAD, chronic combined CHF,  end-stage renal disease on hemodialysis, frequent PVCs/bigeminy, hepatitis C, GI bleed, anemia of chronic disease, pulmonary hypertension, hypertensive heart disease, PAD and presenting with chest pain, cough, and hypertension.  Assessment & Plan    Demand ischemia: In the setting of tachycardia on arrival Troponin peak 2.1  Known coronary artery disease, cocaine abuse   EKG without acute ischemic changes.   Echocardiogram  with preserved LVEF and no obvious wall motion abnormalities. -No plan for heart catheterization -History of GI bleed Stable  Atrial aflutter/fibrillation: Was atrial flutter on arrival with difficult to control rate, then to sinus rhythm most of yesterday, back to atrial flutter this morning 9:20 AM --Avoid AV-nodal blocking and heart-rate suppressing agents, including beta-blockers, calcium-channel blockers, and amiodarone given documented bradycardia Defer long-term anticoagulation given history of significant GI bleeds. He does have elevated CHADS VASC but will need to manage medically at this time  End-stage renal disease: Dialysis per nephrology.  History of GI bleed Please see initial cardiology consult note for details, February and March at [facility] with repeated GI bleeding, leading to arrest  Acute encephalopathy, Hypoglycemia, metabolic acidosis On CRRT, D10 On broad-spectrum antibiotics    Total encounter time more than 25 minutes  Greater than 50% was spent in counseling and coordination of care with the patient  For questions or updates, please contact [place] Please consult [email or web address] for contact info under [facility] Cardiology.     Signed, Ida Rogue, MD  08/10/2018, 11:10 AM

## 2018-08-10 NOTE — Progress Notes (Signed)
[facility] [place] NAME: [name]    MR#:  [id number]  DATE OF BIRTH:  [birth year]  SUBJECTIVE:   Patient remains critically ill on CRRT and on vasopressors.  Prognosis is quite poor.   REVIEW OF SYSTEMS:    Review of Systems  Unable to perform ROS: Mental acuity    Nutrition: NPO Tolerating Diet: NO Tolerating PT: await Eval.   DRUG ALLERGIES:   Allergies  Allergen Reactions  . Sulfa Antibiotics Shortness Of Breath, Itching and Rash    VITALS:  Blood pressure (!) 92/59, pulse 69, temperature 98.1 F (36.7 C), temperature source Rectal, resp. rate 14, height 5\' 8"  (1.727 m), weight 79.5 kg, SpO2 100 %.  PHYSICAL EXAMINATION:   Physical Exam  GENERAL:  63 y.o.-year-old patient lying in bed critically ill on vasopressors and CRRT.   EYES: Pupils equal, round, reactive to light. No scleral icterus. Extraocular muscles intact.  HEENT: Head atraumatic, normocephalic. NECK:  Supple, no jugular venous distention. No thyroid enlargement, no tenderness.  LUNGS: Poor Resp. Effort,  no wheezing, rales, rhonchi. No use of accessory muscles of respiration.  CARDIOVASCULAR: S1, S2 normal. No murmurs, rubs, or gallops.  ABDOMEN: Soft, nontender, nondistended. Bowel sounds present. No organomegaly or mass.  EXTREMITIES: No cyanosis, clubbing or edema b/l.    NEUROLOGIC: Cranial nerves II through XII are intact. No focal Motor or sensory deficits b/l.  Globally weak.  PSYCHIATRIC: The patient is alert and oriented x 1.  SKIN: No obvious rash, lesion, or ulcer.    LABORATORY PANEL:   CBC Recent Labs  Lab 08/10/18 0431  WBC 13.1*  HGB 10.6*  HCT 32.6*  PLT 118*   ------------------------------------------------------------------------------------------------------------------  Chemistries  Recent Labs  Lab 08/07/18 1513  08/10/18 1305  NA  --    < > 130*  K  --    < > 3.3*  CL  --    < > 95*  CO2  --    < > 25   GLUCOSE  --    < > 188*  BUN  --    < > 32*  CREATININE  --    < > 2.84*  CALCIUM  --    < > 8.5*  MG  --    < > 2.3  AST 51*  --   --   ALT 27  --   --   ALKPHOS 87  --   --   BILITOT 1.0  --   --    < > = values in this interval not displayed.   ------------------------------------------------------------------------------------------------------------------  Cardiac Enzymes Recent Labs  Lab 08/08/18 0957  TROPONINI 1.73*   ------------------------------------------------------------------------------------------------------------------  RADIOLOGY:  Dg Shoulder 1v Right  Result Date: 08/09/2018 CLINICAL DATA:  Right shoulder pain. EXAM: RIGHT SHOULDER - 1 VIEW COMPARISON:  None. FINDINGS: Single AP view of the shoulder. No fracture. Alignment is maintained. Mild acromioclavicular spurring. No bony destructive change or periosteal reaction. Soft tissues are unremarkable. IMPRESSION: Single portable AP view the shoulder demonstrating mild acromioclavicular degenerative change. No other explanation for pain. Electronically Signed   By: Keith Rake M.D.   On: 08/09/2018 03:52   Dg Abd 1 View  Result Date: 08/09/2018 CLINICAL DATA:  Abdominal discomfort and worsening lactic acidosis. EXAM: ABDOMEN - 1 VIEW COMPARISON:  Abdominal CT 2 days ago. FINDINGS: No bowel dilatation to suggest obstruction. Small bowel loops in the left abdomen with possible wall thickening. High-density material  in the colon is seen on prior. No free air. Vascular coils in the central abdomen. There are vascular calcifications. IMPRESSION: Suspect wall thickening of small bowel loops in the left lower abdomen, possible enteritis. No obstruction or free air. Electronically Signed   By: Keith Rake M.D.   On: 08/09/2018 03:51   Dg Chest Port 1 View  Result Date: 08/09/2018 CLINICAL DATA:  Acute respiratory failure. EXAM: PORTABLE CHEST 1 VIEW COMPARISON:  Radiograph yesterday. FINDINGS: Tip of the right  central line in the lower SVC. Vascular stent in the region of brachiocephalic unchanged cardiomegaly. Small bilateral pleural effusions, similar to prior. Improved right lung base atelectasis. No acute airspace disease. No pneumothorax. IMPRESSION: 1. Unchanged small bilateral pleural effusions. Improved right basilar atelectasis. 2. Stable cardiomegaly. Electronically Signed   By: Keith Rake M.D.   On: 08/09/2018 03:49     ASSESSMENT AND PLAN:   63 year old male with past medical history of end-stage renal disease on hemodialysis, pulmonary hypertension, ischemic cardiomyopathy, chronic combined systolic diastolic CHF, substance abuse.    1.  Hypotension-etiology unclear.  Questionable septic versus hypovolemic shock. - Patient remains on vasopressors.  Cont. Vasopressin, phenylephrine, Levophed. -Continue stress dose steroids, follow hemodynamics.  Patient is afebrile, cultures are currently negative.  Continue empiric cefepime.  2.  Atrial fibrillation/flutter-new onset for the patient given the critical illness and severe hypotension. -Seen by cardiology and difficult to control rate given the severe hypotension cannot give beta-blocker or calcium channel blockers. - Hold off on long-term anticoagulation given previous history of GI bleed.  3.  End-stage renal disease on hemodialysis-patient is close presently severely hemodynamically unstable and therefore remains on CRRT. -Continue further care as per nephrology.  4.  Secondary hyperparathyroidism-continue PhosLo, Sensipar  5.  Hyperlipidemia-continue atorvastatin.  6.  Previous history of GI bleed- no acute bleeding presently.  Follow hemoglobin.  Patient's prognosis is quite poor given his multiple comorbidities.  Palliative care consult obtained to discuss goals of care and did talk to the patient's son who wants the patient to be a full code for now.  All the records are reviewed and case discussed with Care  Management/Social Worker. Management plans discussed with the patient, family and they are in agreement.  CODE STATUS: Full code  DVT Prophylaxis: Teds and SCDs  TOTAL TIME TAKING CARE OF THIS PATIENT: 30 minutes.   POSSIBLE D/C unclear DAYS, DEPENDING ON CLINICAL CONDITION and progress.   Henreitta Leber M.D on 08/10/2018 at 4:02 PM  Between 7am to 6pm - Pager - [phone]  After 6pm go to [email or web address] - [employer] [facility]  Office  [phone]  CC: Primary care physician; Donnie Coffin, MD

## 2018-08-10 NOTE — Progress Notes (Signed)
Follow up - Critical Care Medicine Note  Patient Details:    [name] is an 63 y.o. male  with a PMH of ESRD on HD (M-W-F), PVC's, Pulmonary HTN, Dyspnea, PVD, Myocardial Infarction, CHF, HTN, Type II Diabetes Mellitus, Chronic Combined Systolic and Diastolic CHF, Chronic Home O2 @2L , CAD, Hepatitis C, and Arthritis.  He presented to [facility] ER on 06/13 via EMS with c/o chest pain worse with coughing and shortness of breath.  He was noted to be hypotensive and with significant troponin elevation due to demand ischemia.  He is not a candidate for anticoagulation due to prior massive GI bleeds.  In the ICU due to encephalopathy and requirement for CRRT.  Lines, Airways, Drains:    Anti-infectives:  Anti-infectives (From admission, onward)   Start     Dose/Rate Route Frequency Ordered Stop   08/09/18 0515  metroNIDAZOLE (FLAGYL) IVPB 500 mg  Status:  Discontinued     500 mg 100 mL/hr over 60 Minutes Intravenous Every 8 hours 08/09/18 0506 08/10/18 1105   08/08/18 2200  ceFEPIme (MAXIPIME) 2 g in sodium chloride 0.9 % 100 mL IVPB     2 g 200 mL/hr over 30 Minutes Intravenous Every 12 hours 08/08/18 1335     08/07/18 1200  ceFEPIme (MAXIPIME) 2 g in sodium chloride 0.9 % 100 mL IVPB  Status:  Discontinued     2 g 200 mL/hr over 30 Minutes Intravenous Every M-W-F (Hemodialysis) 07/28/2018 1250 08/08/18 1335   08/07/18 1200  vancomycin (VANCOCIN) IVPB 750 mg/150 ml premix  Status:  Discontinued     750 mg 150 mL/hr over 60 Minutes Intravenous Every M-W-F (Hemodialysis) 08/16/2018 1250 08/07/18 2155   07/31/2018 1415  vancomycin (VANCOCIN) IVPB 750 mg/150 ml premix     750 mg 150 mL/hr over 60 Minutes Intravenous  Once 07/28/2018 1250 08/16/2018 1600   08/08/2018 0730  ceFEPIme (MAXIPIME) 2 g in sodium chloride 0.9 % 100 mL IVPB     2 g 200 mL/hr over 30 Minutes Intravenous  Once 08/02/2018 0715 08/10/2018 0826   08/20/2018 0730  metroNIDAZOLE (FLAGYL) IVPB 500 mg     500 mg 100 mL/hr over 60 Minutes  Intravenous  Once 08/03/2018 0715 08/07/2018 0908   08/10/2018 0730  vancomycin (VANCOCIN) IVPB 1000 mg/200 mL premix     1,000 mg 200 mL/hr over 60 Minutes Intravenous  Once 07/24/2018 0715 07/31/2018 0908      Microbiology: Results for orders placed or performed during the hospital encounter of 07/25/2018  Blood culture (routine x 2)     Status: None   Collection Time: 07/29/2018  7:46 AM   Specimen: BLOOD  Result Value Ref Range Status   Specimen Description BLOOD RFA  Final   Special Requests   Final    BOTTLES DRAWN AEROBIC AND ANAEROBIC Blood Culture results may not be optimal due to an excessive volume of blood received in culture bottles   Culture   Final    NO GROWTH 5 DAYS Performed at [facility], [address] [place], [place] [address]    Report Status 08/10/2018 FINAL  Final  Blood culture (routine x 2)     Status: None   Collection Time: 07/25/2018  7:47 AM   Specimen: BLOOD  Result Value Ref Range Status   Specimen Description BLOOD R AC  Final   Special Requests   Final    BOTTLES DRAWN AEROBIC AND ANAEROBIC Blood Culture results may not be optimal due to an excessive  volume of blood received in culture bottles   Culture   Final    NO GROWTH 5 DAYS Performed at [facility], [address] [place], [place] [address]    Report Status 08/10/2018 FINAL  Final  SARS Coronavirus 2 (CEPHEID- Performed in [place] hospital lab), Hosp Order     Status: None   Collection Time: 07/30/2018  8:34 AM   Specimen: Nasopharyngeal Swab  Result Value Ref Range Status   SARS Coronavirus 2 NEGATIVE NEGATIVE Final    Comment: (NOTE) If result is NEGATIVE SARS-CoV-2 target nucleic acids are NOT DETECTED. The SARS-CoV-2 RNA is generally detectable in upper and lower  respiratory specimens during the acute phase of infection. The lowest  concentration of SARS-CoV-2 viral copies this assay can detect is 250  copies / mL. A negative result does not preclude SARS-CoV-2  infection  and should not be used as the sole basis for treatment or other  patient management decisions.  A negative result may occur with  improper specimen collection / handling, submission of specimen other  than nasopharyngeal swab, presence of viral mutation(s) within the  areas targeted by this assay, and inadequate number of viral copies  (<250 copies / mL). A negative result must be combined with clinical  observations, patient history, and epidemiological information. If result is POSITIVE SARS-CoV-2 target nucleic acids are DETECTED. The SARS-CoV-2 RNA is generally detectable in upper and lower  respiratory specimens dur ing the acute phase of infection.  Positive  results are indicative of active infection with SARS-CoV-2.  Clinical  correlation with patient history and other diagnostic information is  necessary to determine patient infection status.  Positive results do  not rule out bacterial infection or co-infection with other viruses. If result is PRESUMPTIVE POSTIVE SARS-CoV-2 nucleic acids MAY BE PRESENT.   A presumptive positive result was obtained on the submitted specimen  and confirmed on repeat testing.  While 2019 novel coronavirus  (SARS-CoV-2) nucleic acids may be present in the submitted sample  additional confirmatory testing may be necessary for epidemiological  and / or clinical management purposes  to differentiate between  SARS-CoV-2 and other Sarbecovirus currently known to infect humans.  If clinically indicated additional testing with an alternate test  methodology [phone]) is advised. The SARS-CoV-2 RNA is generally  detectable in upper and lower respiratory sp ecimens during the acute  phase of infection. The expected result is Negative. Fact Sheet for Patients:  StrictlyIdeas.no Fact Sheet for Healthcare Providers: BankingDealers.co.za This test is not yet approved or cleared by the [place]  FDA and has been authorized for detection and/or diagnosis of SARS-CoV-2 by FDA under an Emergency Use Authorization (EUA).  This EUA will remain in effect (meaning this test can be used) for the duration of the COVID-19 declaration under Section 564(b)(1) of the Act, 21 U.S.C. section 360bbb-3(b)(1), unless the authorization is terminated or revoked sooner. Performed at [facility], [address] [place], [place] [address]   MRSA PCR Screening     Status: None   Collection Time: 08/16/2018  1:50 PM   Specimen: Nasal Mucosa; Nasopharyngeal  Result Value Ref Range Status   MRSA by PCR NEGATIVE NEGATIVE Final    Comment:        The GeneXpert MRSA Assay (FDA approved for NASAL specimens only), is one component of a comprehensive MRSA colonization surveillance program. It is not intended to diagnose MRSA infection nor to guide or monitor treatment for MRSA infections. Performed at  [facility], [address] [place], [place] [address]   CULTURE, BLOOD (ROUTINE X 2) w Reflex to ID Panel     Status: None (Preliminary result)   Collection Time: 08/09/18  3:24 AM   Specimen: BLOOD  Result Value Ref Range Status   Specimen Description BLOOD BLOOD RIGHT ARM  Final   Special Requests   Final    BOTTLES DRAWN AEROBIC AND ANAEROBIC Blood Culture adequate volume   Culture   Final    NO GROWTH 1 DAY Performed at [facility], [address] [place], [place] [address]    Report Status PENDING  Incomplete  CULTURE, BLOOD (ROUTINE X 2) w Reflex to ID Panel     Status: None (Preliminary result)   Collection Time: 08/09/18  3:41 AM   Specimen: BLOOD  Result Value Ref Range Status   Specimen Description BLOOD RIGHT ANTECUBITAL  Final   Special Requests   Final    BOTTLES DRAWN AEROBIC ONLY Blood Culture results may not be optimal due to an inadequate volume of blood received in culture bottles   Culture   Final    NO GROWTH 1 DAY Performed at [facility], [address] [place], [place] [address]    Report Status PENDING  Incomplete    Best Practice/Protocols:  VTE Prophylaxis: Heparin (CRRT) GI Prophylaxis: Proton Pump Inhibitor Hyperglycemia (ICU)  Events: 6/16 dialysis catheter right IJ 6/16 CRRT initiated 6/17 R Femoral CVL  Studies: Dg Chest 2 View  Result Date: 08/09/2018 CLINICAL DATA:  New cough and shortness of breath EXAM: CHEST - 2 VIEW COMPARISON:  May 10, 2018 FINDINGS: Vascular stent projects over the left subclavian region. Stable cardiomegaly. The hila, mediastinum, lungs, and pleura are normal. IMPRESSION: Persistent cardiomegaly.  No other abnormalities. Electronically Signed   By: Dorise Bullion III M.D   On: 08/04/2018 08:00   Dg Shoulder 1v Right  Result Date: 08/09/2018 CLINICAL DATA:  Right shoulder pain. EXAM: RIGHT SHOULDER - 1 VIEW COMPARISON:  None. FINDINGS: Single AP view of the shoulder. No fracture. Alignment is maintained. Mild acromioclavicular spurring. No bony destructive change or periosteal reaction. Soft tissues are unremarkable. IMPRESSION: Single portable AP view the shoulder demonstrating mild acromioclavicular degenerative change. No other explanation for pain. Electronically Signed   By: Keith Rake M.D.   On: 08/09/2018 03:52   Dg Abd 1 View  Result Date: 08/09/2018 CLINICAL DATA:  Abdominal discomfort and worsening lactic acidosis. EXAM: ABDOMEN - 1 VIEW COMPARISON:  Abdominal CT 2 days ago. FINDINGS: No bowel dilatation to suggest obstruction. Small bowel loops in the left abdomen with possible wall thickening. High-density material in the colon is seen on prior. No free air. Vascular coils in the central abdomen. There are vascular calcifications. IMPRESSION: Suspect wall thickening of small bowel loops in the left lower abdomen, possible enteritis. No obstruction or free air. Electronically Signed   By: Keith Rake M.D.   On: 08/09/2018 03:51   Ct Head Wo  Contrast  Result Date: 08/07/2018 CLINICAL DATA:  Altered mental status EXAM: CT HEAD WITHOUT CONTRAST TECHNIQUE: Contiguous axial images were obtained from the base of the skull through the vertex without intravenous contrast. COMPARISON:  05/04/2018 FINDINGS: Brain: No evidence of acute infarction, hemorrhage, hydrocephalus, extra-axial collection or mass lesion/mass effect. Vascular: No hyperdense vessel or unexpected calcification. Skull: Normal. Negative for fracture or focal lesion. Sinuses/Orbits: Small, partially imaged air-fluid level of the left maxillary sinus. Other: Contrast opacification of the vascular  structures and meninges, likely due to persistent circulating contrast in the setting of renal failure and hemodialysis. IMPRESSION: 1.  No acute intracranial pathology. 2. Small, partially imaged air-fluid level of the left maxillary sinus. Correlate for evidence of sinusitis. 3. Contrast opacification of the vascular structures and meninges, likely due to persistent circulating contrast in the setting of renal failure and hemodialysis. Electronically Signed   By: Eddie Candle M.D.   On: 08/07/2018 16:31   Ct Chest W Contrast  Result Date: 08/07/2018 CLINICAL DATA:  Chest pain and shortness of breath, abdominal pain and distension today. EXAM: CT CHEST, ABDOMEN, AND PELVIS WITH CONTRAST TECHNIQUE: Multidetector CT imaging of the chest, abdomen and pelvis was performed following the standard protocol during bolus administration of intravenous contrast. CONTRAST:  143mL OMNIPAQUE IOHEXOL 300 MG/ML  SOLN COMPARISON:  Chest CT 08/06/2018 and abdominal CT scan 05/04/2018 FINDINGS: CT CHEST FINDINGS Cardiovascular: The heart is enlarged but stable. No pericardial effusion. Stable tortuosity, ectasia and heavy calcification of the thoracic aorta. Stable three-vessel coronary artery calcifications. Stable left brachiocephalic vein stent. Enlarged pulmonary arteries consistent with pulmonary hypertension.  Mediastinum/Nodes: Stable scattered mediastinal and hilar lymph nodes but no mass or overt adenopathy. The esophagus is grossly normal. Lungs/Pleura: Small to moderate-sized right effusion and small diffusion both larger when compared to prior recent chest CT. There is also overlying atelectasis. No overt pulmonary edema, focal pulmonary infiltrates or worrisome pulmonary lesions. Musculoskeletal: No chest wall masses identified. There is diffuse body wall edema noted suggesting anasarca. No supraclavicular or axillary lymphadenopathy the. The bony structures are unremarkable and appears stable. CT ABDOMEN PELVIS FINDINGS Hepatobiliary: Moderate reflux of contrast down the IVC and into the hepatic veins suggesting right heart failure or tricuspid regurgitation. Liver demonstrates changes suggesting hepatic congestion. No worrisome hepatic lesions or intrahepatic biliary dilatation. Contrast noted in the gallbladder related to the recent chest CT with contrast. Pancreas: No mass, inflammation or ductal dilatation. Spleen: Normal size.  No focal lesions. Adrenals/Urinary Tract: The adrenal glands are unremarkable and stable. Small kidneys with extensive arterial calcifications. No worrisome renal lesions. The bladder is grossly normal. Stomach/Bowel: The stomach, duodenum, small bowel and colon are grossly normal. No obvious acute inflammatory process, mass lesion or obstructive findings. Vascular/Lymphatic: Heavy atherosclerotic calcifications involving the aorta and branch vessels, particularly the renal arteries. Stable scattered mesenteric and retroperitoneal lymph nodes but no mass or overt adenopathy. Reproductive: The prostate gland and seminal vesicles are unremarkable. Other: Diffuse mesenteric edema and small volume abdominal/pelvic ascites. The pelvic fluid demonstrates higher attenuation which could suggest hemorrhage or peritonitis. No obvious active extravasation of contrast material. Coils are noted in  the region of the gastroduodenal artery from previous embolization procedure. Musculoskeletal: Severe/aggressive degenerative changes involving the lumbar spine. Amyloid spondyloarthropathy would certainly be a consideration given the patient's history of renal dialysis. IMPRESSION: 1. Enlarging bilateral pleural effusions since the prior CT scan from 2 days ago. Moderate on the right and small the left with overlying atelectasis. 2. Stable cardiac enlargement and vascular congestion but no overt pulmonary edema. 3. Reflux of contrast down the IVC consistent with right heart failure or tricuspid regurgitation. Changes of hepatic congestion are noted. 4. Diffuse body wall edema, mesenteric edema and abdominal/pelvic ascites. The ascites in the abdomen/pelvis is high in attenuation suggesting complex fluid or hemorrhage. No obvious extravasating contrast is identified. 5. Extensive vascular calcifications. 6. Severe and aggressive degenerative changes involving the lumbar spine. Amyloid spondyloarthropathy would be a consideration. Electronically Signed   By: Mamie Nick.  Gallerani M.D.   On: 08/07/2018 19:37   Ct Angio Chest Pe W And/or Wo Contrast  Result Date: 08/16/2018 CLINICAL DATA:  Chest pain. EXAM: CT ANGIOGRAPHY CHEST WITH CONTRAST TECHNIQUE: Multidetector CT imaging of the chest was performed using the standard protocol during bolus administration of intravenous contrast. Multiplanar CT image reconstructions and MIPs were obtained to evaluate the vascular anatomy. CONTRAST:  43mL OMNIPAQUE IOHEXOL 350 MG/ML SOLN COMPARISON:  Chest x-ray August 05, 2018.  Chest CT Jul 04, 2016. FINDINGS: Cardiovascular: Cardiomegaly is noted. A stent is seen in the left brachiocephalic vein. Patency cannot be evaluated due to lack of contrast in these vessels due to a right-sided injection. Atherosclerotic changes are seen in the aorta. The ascending thoracic aorta measures up to 4.4 cm, unchanged since May of 2018. No dissection  identified. The main pulmonary artery measures 3.7 cm. No pulmonary emboli. Mediastinum/Nodes: The thyroid and esophagus are normal. No adenopathy. Tiny pleural effusions are noted. No pericardial effusion. Lungs/Pleura: Left retrocardiac atelectasis is noted. No other suspicious infiltrates. Central airways are normal. No pneumothorax. Upper Abdomen: No acute abnormality. Musculoskeletal: No chest wall abnormality. No acute or significant osseous findings. Review of the MIP images confirms the above findings. IMPRESSION: 1. No pulmonary emboli identified. 2. Cardiomegaly.  Tiny pleural effusions. 3. There is a stent in the left brachiocephalic vein. Patency cannot be evaluated due to a right-sided injection. 4. Atherosclerotic changes in the aorta. 5. 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 Recommend annual imaging followup by CTA or MRA. This recommendation follows 2010 ACCF/AHA/AATS/ACR/ASA/SCA/SCAI/SIR/STS/SVM Guidelines for the Diagnosis and Management of Patients with Thoracic Aortic Disease. Circulation. 2010; 121: U542-H062. Aortic aneurysm NOS (ICD10-I71.9) 6. The main pulmonary artery measures 3.7 cm which raises the possibility of pulmonary arterial hypertension. 7. Mild left basilar atelectasis in the retrocardiac region. Aortic Atherosclerosis (ICD10-I70.0). Electronically Signed   By: Dorise Bullion III M.D   On: 08/20/2018 10:29   Ct Abdomen Pelvis W Contrast  Result Date: 08/07/2018 CLINICAL DATA:  Chest pain and shortness of breath, abdominal pain and distension today. EXAM: CT CHEST, ABDOMEN, AND PELVIS WITH CONTRAST TECHNIQUE: Multidetector CT imaging of the chest, abdomen and pelvis was performed following the standard protocol during bolus administration of intravenous contrast. CONTRAST:  161mL OMNIPAQUE IOHEXOL 300 MG/ML  SOLN COMPARISON:  Chest CT 08/03/2018 and abdominal CT scan 05/04/2018 FINDINGS: CT CHEST FINDINGS Cardiovascular: The heart  is enlarged but stable. No pericardial effusion. Stable tortuosity, ectasia and heavy calcification of the thoracic aorta. Stable three-vessel coronary artery calcifications. Stable left brachiocephalic vein stent. Enlarged pulmonary arteries consistent with pulmonary hypertension. Mediastinum/Nodes: Stable scattered mediastinal and hilar lymph nodes but no mass or overt adenopathy. The esophagus is grossly normal. Lungs/Pleura: Small to moderate-sized right effusion and small diffusion both larger when compared to prior recent chest CT. There is also overlying atelectasis. No overt pulmonary edema, focal pulmonary infiltrates or worrisome pulmonary lesions. Musculoskeletal: No chest wall masses identified. There is diffuse body wall edema noted suggesting anasarca. No supraclavicular or axillary lymphadenopathy the. The bony structures are unremarkable and appears stable. CT ABDOMEN PELVIS FINDINGS Hepatobiliary: Moderate reflux of contrast down the IVC and into the hepatic veins suggesting right heart failure or tricuspid regurgitation. Liver demonstrates changes suggesting hepatic congestion. No worrisome hepatic lesions or intrahepatic biliary dilatation. Contrast noted in the gallbladder related to the recent chest CT with contrast. Pancreas: No mass, inflammation or ductal dilatation. Spleen: Normal size.  No focal lesions. Adrenals/Urinary  Tract: The adrenal glands are unremarkable and stable. Small kidneys with extensive arterial calcifications. No worrisome renal lesions. The bladder is grossly normal. Stomach/Bowel: The stomach, duodenum, small bowel and colon are grossly normal. No obvious acute inflammatory process, mass lesion or obstructive findings. Vascular/Lymphatic: Heavy atherosclerotic calcifications involving the aorta and branch vessels, particularly the renal arteries. Stable scattered mesenteric and retroperitoneal lymph nodes but no mass or overt adenopathy. Reproductive: The prostate gland  and seminal vesicles are unremarkable. Other: Diffuse mesenteric edema and small volume abdominal/pelvic ascites. The pelvic fluid demonstrates higher attenuation which could suggest hemorrhage or peritonitis. No obvious active extravasation of contrast material. Coils are noted in the region of the gastroduodenal artery from previous embolization procedure. Musculoskeletal: Severe/aggressive degenerative changes involving the lumbar spine. Amyloid spondyloarthropathy would certainly be a consideration given the patient's history of renal dialysis. IMPRESSION: 1. Enlarging bilateral pleural effusions since the prior CT scan from 2 days ago. Moderate on the right and small the left with overlying atelectasis. 2. Stable cardiac enlargement and vascular congestion but no overt pulmonary edema. 3. Reflux of contrast down the IVC consistent with right heart failure or tricuspid regurgitation. Changes of hepatic congestion are noted. 4. Diffuse body wall edema, mesenteric edema and abdominal/pelvic ascites. The ascites in the abdomen/pelvis is high in attenuation suggesting complex fluid or hemorrhage. No obvious extravasating contrast is identified. 5. Extensive vascular calcifications. 6. Severe and aggressive degenerative changes involving the lumbar spine. Amyloid spondyloarthropathy would be a consideration. Electronically Signed   By: Marijo Sanes M.D.   On: 08/07/2018 19:37   Dg Chest Port 1 View  Result Date: 08/09/2018 CLINICAL DATA:  Acute respiratory failure. EXAM: PORTABLE CHEST 1 VIEW COMPARISON:  Radiograph yesterday. FINDINGS: Tip of the right central line in the lower SVC. Vascular stent in the region of brachiocephalic unchanged cardiomegaly. Small bilateral pleural effusions, similar to prior. Improved right lung base atelectasis. No acute airspace disease. No pneumothorax. IMPRESSION: 1. Unchanged small bilateral pleural effusions. Improved right basilar atelectasis. 2. Stable cardiomegaly.  Electronically Signed   By: Keith Rake M.D.   On: 08/09/2018 03:49   Dg Chest Port 1 View  Result Date: 08/08/2018 CLINICAL DATA:  Central line placement EXAM: PORTABLE CHEST 1 VIEW COMPARISON:  08/21/2018 FINDINGS: The patient is status post placement of a right-sided central venous catheter. The tip is well position near the cavoatrial junction. The heart size remains enlarged. Aortic calcifications are noted. A vascular stent projects over the left brachiocephalic vein. There are small bilateral pleural effusions, left worse than right. There is an airspace opacity at the right lung base favored to represent atelectasis, however an infiltrate or aspiration is not excluded. There is no pneumothorax. IMPRESSION: 1. Well-positioned right-sided central venous catheter. No pneumothorax. 2. Cardiomegaly with persistent small bilateral pleural effusions and generalized volume overload. Electronically Signed   By: Constance Holster M.D.   On: 08/08/2018 16:41   Ct Angio Chest/abd/pel For Dissection W And/or Wo Contrast  Result Date: 08/03/2018 CLINICAL DATA:  Chest pain for 4 days.  Cough. EXAM: CT ANGIOGRAPHY CHEST, ABDOMEN AND PELVIS TECHNIQUE: Multidetector CT imaging through the chest, abdomen and pelvis was performed using the standard protocol during bolus administration of intravenous contrast. Multiplanar reconstructed images and MIPs were obtained and reviewed to evaluate the vascular anatomy. CONTRAST:  186mL ISOVUE-370 IOPAMIDOL (ISOVUE-370) INJECTION 76% COMPARISON:  CT of the chest August 05, 2018. CT of the abdomen and pelvis May 04, 2018 FINDINGS: CTA CHEST FINDINGS Cardiovascular: Cardiomegaly. Coronary artery calcifications  identified. The pulmonary arteries were better assessed on the recent CT a of the chest for PE. No pulmonary emboli in the central pulmonary arteries. The patient had a normal PE study earlier today as well. Atherosclerotic changes are seen in the thoracic aorta without  dissection. The ascending thoracic aorta measures 4.2 cm on today's study, mildly aneurysmal. Mediastinum/Nodes: There is a stent in the left brachiocephalic vein which is well opacified on this study. The thyroid and esophagus are normal. A borderline node anterior to the trachea measuring 14 mm on axial image 40 is stable since 2018, likely reactive. No other evidence of adenopathy identified. Small pleural effusions. No pericardial effusion. Mild increased attenuation in the subcutaneous fat suggests volume overload. Lungs/Pleura: Tiny pleural effusions. Central airways are normal. Mild atelectasis in the posterior left upper lobe. Mild atelectasis in the retrocardiac region, stable. No pneumothorax. Central airways are unchanged no suspicious pulmonary nodules, masses, or focal infiltrates. Musculoskeletal: There is a healed anterior left rib fracture. No acute bony abnormalities. Review of the MIP images confirms the above findings. CTA ABDOMEN AND PELVIS FINDINGS VASCULAR Aorta: Atherosclerotic change is seen in the nonaneurysmal abdominal aorta, extending into the iliac and femoral vessels. No dissection. Celiac: Patent without evidence of aneurysm, dissection, vasculitis or significant stenosis. SMA: Atherosclerotic changes near the origin of the SMA without significant stenosis. No other abnormalities. Renals: Atherosclerosis at the origin of the bilateral renal arteries is identified. IMA: Patent without evidence of aneurysm, dissection, vasculitis or significant stenosis. Inflow: Patent without evidence of aneurysm, dissection, vasculitis or significant stenosis. Veins: No obvious venous abnormality within the limitations of this arterial phase study. Review of the MIP images confirms the above findings. NON-VASCULAR Hepatobiliary: No focal liver abnormality is seen. No gallstones, gallbladder wall thickening, or biliary dilatation. Pancreas: Unremarkable. No pancreatic ductal dilatation or surrounding  inflammatory changes. Spleen: Normal in size without focal abnormality. Adrenals/Urinary Tract: Adrenal glands are normal. The kidneys are atrophic consistent with renal disease. No hydronephrosis or acute perinephric stranding. The ureters are normal. The bladder is poorly distended but unremarkable. Stomach/Bowel: The stomach and small bowel are unremarkable. The colon is normal. The appendix is unremarkable. Lymphatic: No significant vascular findings are present. No enlarged abdominal or pelvic lymph nodes. Reproductive: Prostate is unremarkable. Other: There is increased attenuation in the subcutaneous fat and in the intra-abdominal fat consistent volume overload. There is a small amount of fluid in the right pericolic gutter. Some of this fluid is adjacent to the appendix but there is no convincing evidence of appendicitis. Musculoskeletal: No acute or significant osseous findings. Review of the MIP images confirms the above findings. IMPRESSION: 1. 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 This is stable since 2018. No dissection. Atherosclerotic changes noted. Recommend annual imaging followup by CTA or MRA. This recommendation follows 2010 ACCF/AHA/AATS/ACR/ASA/SCA/SCAI/SIR/STS/SVM Guidelines for the Diagnosis and Management of Patients with Thoracic Aortic Disease. Circulation. 2010; 121: T062-I948. Aortic aneurysm NOS (ICD10-I71.9) 2. Cardiomegaly.  Coronary artery calcifications. 3. Increased attenuation in the subcutaneous fat diffusely as well as in the intra-abdominal fat with a small amount of fluid in the right pericolic gutter. These findings are most consistent with volume overload. 4. Tiny bilateral pleural effusions. Electronically Signed   By: Dorise Bullion III M.D   On: 08/14/2018 11:36    Consults: Treatment Team:  Minna Merritts, MD [facility], [facility], MD   Subjective:    Overnight Issues: Remains encephalopathic with  intermittent agitation.  Requiring  Precedex.  On CRRT, tolerating.  Pressor requirement continues.  Persistent issues with lactic acidosis are now slowly clearing with CRRT.  Requires central venous line last night due to poor venous access.  Objective:  Vital signs for last 24 hours: Temp:  [95 F (35 C)-98.4 F (36.9 C)] 98.2 F (36.8 C) (06/18 1600) Pulse Rate:  [31-92] 92 (06/18 1715) Resp:  [8-23] 17 (06/18 1715) BP: (74-120)/(48-77) 120/77 (06/18 1715) SpO2:  [93 %-100 %] 100 % (06/18 1715) FiO2 (%):  [28 %] 28 % (06/18 0400) Weight:  [79.5 kg] 79.5 kg (06/18 0424)  Hemodynamic parameters for last 24 hours:    Intake/Output from previous day: 06/17 0701 - 06/18 0700 In: 1642.6 [P.O.:380; I.V.:867.7; IV Piggyback:394.9] Out: 581   Intake/Output this shift: Total I/O In: 573.7 [I.V.:323.7; IV Piggyback:250] Out: 242 [Other:242]  Vent settings for last 24 hours: FiO2 (%):  [28 %] 28 %  Physical Exam:  General:chronically ill appearing male,no respiratory distress, intermittently agitated Neuro:  Confused, follows commands when not agitated no distinct focal deficit HEENT: supple, no JVD , trachea midline no crepitus, right IJ dialysis catheter with clean dressing Edentulous. Cardiovascular: sinus bradycardia, no R/G Lungs:  Coarse breath sounds throughout, nonlabored Abdomen: +BS x4, soft, non tender, non distended, right femoral CVL with clean dressing. Musculoskeletal: trace bilateral lower extremity edema  Skin: Left upper arm av fistula positive for bruit and thrill   Required right femoral CVL due to poor venous access.  Assessment/Plan:   New onset paroxysmal atrial flutter converted to sinus bradycardia  Elevated troponin secondary to demand ischemia vs. NSTEMI  Cardiogenic vs. Septic Shock  Stable angina reported recent cocaine use  Hx: Cocaine Abuse, CAD, MI, Chronic combined systolic and diastolic CHF Continuous telemetry monitoring  Trend troponin's   Maintain map >60, pressors Continue midodrine, hydrocortisone - shock dose  Will NOT start heparin drip per cardiology recommendations-pt not a candidate for anticoagulation due to profound GI Bleed hx 3 months ago at [facility] that led to cardiac arrest Hold antiarrhythmic and antihypertensive medications for now  Continue atorvastatin Analgesia as needed Once mentation improves will provide cocaine abuse cessation counseling   ESRD on HD, now on CRRT Lactic acidosis Trend renal panel Replace electrolytes as indicated  Avoid nephrotoxic medications Discussed with Dr. Juleen China, nephrology  History of recent GI bleed  VTE px: SCD's,  Heparin as required by CRRT protocol Trend CBC  Monitor for s/sx of bleeding and transfuse for hgb <7  Bronchitis  Trend WBC and monitor fever curve Follow cultures  Continue cefepime will discontinue vancomycin Discontinued Flagyl due to potential to aggravate agitation and no clear-cut indication for this medication  Acute encephalopathy secondary to severe hypoglycemia and metabolic acidosis   Weaning off D10 CBG's q1hr for now  CRRT per Nephrology recommendations   Query amyloidosis   LOS: 5 days   Additional comments:Multidisciplinary rounds were performed with ICU team prognosis overall is very poor.  Recommend palliative care consultation.  Critical Care Total Time*: 40 Minutes  C. Derrill Kay, MD [facility]  08/10/2018  *Care during the described time interval was provided by me and/or other providers on the critical care team.  I have reviewed this patient's available data, including medical history, events of note, physical examination and test results as part of my evaluation.

## 2018-08-11 ENCOUNTER — Inpatient Hospital Stay: Payer: Medicare Other

## 2018-08-11 DIAGNOSIS — R6520 Severe sepsis without septic shock: Secondary | ICD-10-CM

## 2018-08-11 DIAGNOSIS — A419 Sepsis, unspecified organism: Secondary | ICD-10-CM

## 2018-08-11 LAB — GLUCOSE, CAPILLARY
Glucose-Capillary: 157 mg/dL — ABNORMAL HIGH (ref 70–99)
Glucose-Capillary: 160 mg/dL — ABNORMAL HIGH (ref 70–99)
Glucose-Capillary: 141 mg/dL — ABNORMAL HIGH (ref 70–99)
Glucose-Capillary: 119 mg/dL — ABNORMAL HIGH (ref 70–99)
Glucose-Capillary: 128 mg/dL — ABNORMAL HIGH (ref 70–99)

## 2018-08-11 LAB — RENAL FUNCTION PANEL
Albumin: 2.7 g/dL — ABNORMAL LOW (ref 3.5–5.0)
Albumin: 2.7 g/dL — ABNORMAL LOW (ref 3.5–5.0)
Albumin: 2.7 g/dL — ABNORMAL LOW (ref 3.5–5.0)
Albumin: 2.7 g/dL — ABNORMAL LOW (ref 3.5–5.0)
Anion gap: 10 (ref 5–15)
Anion gap: 11 (ref 5–15)
Anion gap: 11 (ref 5–15)
Anion gap: 12 (ref 5–15)
BUN: 31 mg/dL — ABNORMAL HIGH (ref 8–23)
BUN: 31 mg/dL — ABNORMAL HIGH (ref 8–23)
BUN: 32 mg/dL — ABNORMAL HIGH (ref 8–23)
BUN: 32 mg/dL — ABNORMAL HIGH (ref 8–23)
CO2: 21 mmol/L — ABNORMAL LOW (ref 22–32)
CO2: 23 mmol/L (ref 22–32)
CO2: 23 mmol/L (ref 22–32)
CO2: 24 mmol/L (ref 22–32)
Calcium: 8.7 mg/dL — ABNORMAL LOW (ref 8.9–10.3)
Calcium: 9 mg/dL (ref 8.9–10.3)
Calcium: 9.1 mg/dL (ref 8.9–10.3)
Calcium: 9.1 mg/dL (ref 8.9–10.3)
Chloride: 103 mmol/L (ref 98–111)
Chloride: 104 mmol/L (ref 98–111)
Chloride: 98 mmol/L (ref 98–111)
Chloride: 99 mmol/L (ref 98–111)
Creatinine, Ser: 2.17 mg/dL — ABNORMAL HIGH (ref 0.61–1.24)
Creatinine, Ser: 2.25 mg/dL — ABNORMAL HIGH (ref 0.61–1.24)
Creatinine, Ser: 2.4 mg/dL — ABNORMAL HIGH (ref 0.61–1.24)
Creatinine, Ser: 2.46 mg/dL — ABNORMAL HIGH (ref 0.61–1.24)
GFR calc Af Amer: 31 mL/min — ABNORMAL LOW (ref >60)
GFR calc Af Amer: 32 mL/min — ABNORMAL LOW (ref >60)
GFR calc Af Amer: 34 mL/min — ABNORMAL LOW (ref >60)
GFR calc Af Amer: 36 mL/min — ABNORMAL LOW (ref >60)
GFR calc non Af Amer: 27 mL/min — ABNORMAL LOW (ref >60)
GFR calc non Af Amer: 27 mL/min — ABNORMAL LOW (ref >60)
GFR calc non Af Amer: 30 mL/min — ABNORMAL LOW (ref >60)
GFR calc non Af Amer: 31 mL/min — ABNORMAL LOW (ref >60)
Glucose, Bld: 140 mg/dL — ABNORMAL HIGH (ref 70–99)
Glucose, Bld: 170 mg/dL — ABNORMAL HIGH (ref 70–99)
Glucose, Bld: 181 mg/dL — ABNORMAL HIGH (ref 70–99)
Glucose, Bld: 183 mg/dL — ABNORMAL HIGH (ref 70–99)
Phosphorus: 4 mg/dL (ref 2.5–4.6)
Phosphorus: 4.5 mg/dL (ref 2.5–4.6)
Phosphorus: 4.7 mg/dL — ABNORMAL HIGH (ref 2.5–4.6)
Phosphorus: 5 mg/dL — ABNORMAL HIGH (ref 2.5–4.6)
Potassium: 3.8 mmol/L (ref 3.5–5.1)
Potassium: 3.8 mmol/L (ref 3.5–5.1)
Potassium: 3.9 mmol/L (ref 3.5–5.1)
Potassium: 4 mmol/L (ref 3.5–5.1)
Sodium: 132 mmol/L — ABNORMAL LOW (ref 135–145)
Sodium: 134 mmol/L — ABNORMAL LOW (ref 135–145)
Sodium: 136 mmol/L (ref 135–145)
Sodium: 137 mmol/L (ref 135–145)

## 2018-08-11 LAB — CBC WITH DIFFERENTIAL/PLATELET
Abs Immature Granulocytes: 0.07 10*3/uL (ref 0.00–0.07)
Basophils Absolute: 0 10*3/uL (ref 0.0–0.1)
Basophils Relative: 0 %
Eosinophils Absolute: 0 10*3/uL (ref 0.0–0.5)
Eosinophils Relative: 0 %
HCT: 35.8 % — ABNORMAL LOW (ref 39.0–52.0)
Hemoglobin: 11.5 g/dL — ABNORMAL LOW (ref 13.0–17.0)
Immature Granulocytes: 1 %
Lymphocytes Relative: 1 %
Lymphs Abs: 0.1 10*3/uL — ABNORMAL LOW (ref 0.7–4.0)
MCH: 27.7 pg (ref 26.0–34.0)
MCHC: 32.1 g/dL (ref 30.0–36.0)
MCV: 86.3 fL (ref 80.0–100.0)
Monocytes Absolute: 0.5 10*3/uL (ref 0.1–1.0)
Monocytes Relative: 5 %
Neutro Abs: 10.2 10*3/uL — ABNORMAL HIGH (ref 1.7–7.7)
Neutrophils Relative %: 93 %
Platelets: 150 10*3/uL (ref 150–400)
RBC: 4.15 MIL/uL — ABNORMAL LOW (ref 4.22–5.81)
RDW: 19.4 % — ABNORMAL HIGH (ref 11.5–15.5)
WBC: 10.9 10*3/uL — ABNORMAL HIGH (ref 4.0–10.5)
nRBC: 0.6 % — ABNORMAL HIGH (ref 0.0–0.2)

## 2018-08-11 LAB — MAGNESIUM
Magnesium: 2.1 mg/dL (ref 1.7–2.4)
Magnesium: 2.1 mg/dL (ref 1.7–2.4)
Magnesium: 2.1 mg/dL (ref 1.7–2.4)

## 2018-08-11 LAB — COOXEMETRY PANEL
Carboxyhemoglobin: 1.4 % (ref 0.5–1.5)
Methemoglobin: 0.4 % (ref 0.0–1.5)
O2 Saturation: 53.6 %

## 2018-08-11 LAB — PROCALCITONIN: Procalcitonin: 2.78 ng/mL

## 2018-08-11 MED ORDER — HYDROCORTISONE NA SUCCINATE PF 100 MG IJ SOLR
50.0000 mg | Freq: Two times a day (BID) | INTRAMUSCULAR | Status: DC
  Administered 2018-08-11 – 2018-08-12 (×2): 50 mg via INTRAVENOUS
  Filled 2018-08-11 (×2): qty 2

## 2018-08-11 MED ORDER — MORPHINE SULFATE (PF) 2 MG/ML IV SOLN
2.0000 mg | Freq: Once | INTRAVENOUS | Status: AC
  Administered 2018-08-11: 2 mg via INTRAVENOUS

## 2018-08-11 MED ORDER — DEXMEDETOMIDINE HCL IN NACL 400 MCG/100ML IV SOLN
0.0000 ug/kg/h | INTRAVENOUS | Status: DC
  Administered 2018-08-11: 0.7 ug/kg/h via INTRAVENOUS
  Administered 2018-08-11: 0.4 ug/kg/h via INTRAVENOUS
  Administered 2018-08-11: 0.7 ug/kg/h via INTRAVENOUS
  Administered 2018-08-12 (×2): 1.2 ug/kg/h via INTRAVENOUS
  Administered 2018-08-12 (×2): 0.7 ug/kg/h via INTRAVENOUS
  Administered 2018-08-13 (×3): 1.2 ug/kg/h via INTRAVENOUS
  Administered 2018-08-13: 1.8 ug/kg/h via INTRAVENOUS
  Filled 2018-08-11 (×14): qty 100

## 2018-08-11 MED ORDER — CHLORHEXIDINE GLUCONATE 0.12 % MT SOLN
15.0000 mL | Freq: Two times a day (BID) | OROMUCOSAL | Status: DC
  Administered 2018-08-11 – 2018-08-13 (×5): 15 mL via OROMUCOSAL
  Filled 2018-08-11 (×2): qty 15

## 2018-08-11 MED ORDER — ORAL CARE MOUTH RINSE
15.0000 mL | Freq: Two times a day (BID) | OROMUCOSAL | Status: DC
  Administered 2018-08-11 – 2018-08-13 (×4): 15 mL via OROMUCOSAL

## 2018-08-11 MED ORDER — PANTOPRAZOLE SODIUM 40 MG IV SOLR
40.0000 mg | INTRAVENOUS | Status: DC
  Administered 2018-08-11 – 2018-08-12 (×2): 40 mg via INTRAVENOUS
  Filled 2018-08-11 (×2): qty 40

## 2018-08-11 MED ORDER — INSULIN ASPART 100 UNIT/ML ~~LOC~~ SOLN
0.0000 [IU] | SUBCUTANEOUS | Status: DC
  Administered 2018-08-11: 2 [IU] via SUBCUTANEOUS
  Administered 2018-08-11: 3 [IU] via SUBCUTANEOUS
  Administered 2018-08-11: 12:00:00 2 [IU] via SUBCUTANEOUS
  Administered 2018-08-12: 3 [IU] via SUBCUTANEOUS
  Administered 2018-08-13: 2 [IU] via SUBCUTANEOUS
  Filled 2018-08-11 (×5): qty 1

## 2018-08-11 MED ORDER — CHLORHEXIDINE GLUCONATE CLOTH 2 % EX PADS
6.0000 | MEDICATED_PAD | Freq: Every day | CUTANEOUS | Status: DC
  Administered 2018-08-11: 6 via TOPICAL

## 2018-08-11 NOTE — Progress Notes (Signed)
Follow up - Critical Care Medicine Note  Patient Details:    [name] is an 63 y.o. male  with a PMH of ESRD on HD (M-W-F), PVC's, Pulmonary HTN, Dyspnea, PVD, Myocardial Infarction, CHF, HTN, Type II Diabetes Mellitus, Chronic Combined Systolic and Diastolic CHF, Chronic Home O2 @2L , CAD, Hepatitis C, and Arthritis.  He presented to [facility] ER on 06/13 via EMS with c/o chest pain worse with coughing and shortness of breath.  He was noted to be hypotensive and with significant troponin elevation due to demand ischemia.  He is not a candidate for anticoagulation due to prior massive GI bleeds.  In the ICU due to encephalopathy and requirement for CRRT.  Anti-infectives:  Anti-infectives (From admission, onward)   Start     Dose/Rate Route Frequency Ordered Stop   08/09/18 0515  metroNIDAZOLE (FLAGYL) IVPB 500 mg  Status:  Discontinued     500 mg 100 mL/hr over 60 Minutes Intravenous Every 8 hours 08/09/18 0506 08/10/18 1105   08/08/18 2200  ceFEPIme (MAXIPIME) 2 g in sodium chloride 0.9 % 100 mL IVPB     2 g 200 mL/hr over 30 Minutes Intravenous Every 12 hours 08/08/18 1335     08/07/18 1200  ceFEPIme (MAXIPIME) 2 g in sodium chloride 0.9 % 100 mL IVPB  Status:  Discontinued     2 g 200 mL/hr over 30 Minutes Intravenous Every M-W-F (Hemodialysis) 08/01/2018 1250 08/08/18 1335   08/07/18 1200  vancomycin (VANCOCIN) IVPB 750 mg/150 ml premix  Status:  Discontinued     750 mg 150 mL/hr over 60 Minutes Intravenous Every M-W-F (Hemodialysis) 08/14/2018 1250 08/07/18 2155   07/25/2018 1415  vancomycin (VANCOCIN) IVPB 750 mg/150 ml premix     750 mg 150 mL/hr over 60 Minutes Intravenous  Once 08/22/2018 1250 08/07/2018 1600   08/10/2018 0730  ceFEPIme (MAXIPIME) 2 g in sodium chloride 0.9 % 100 mL IVPB     2 g 200 mL/hr over 30 Minutes Intravenous  Once 08/10/2018 0715 07/25/2018 0826   08/08/2018 0730  metroNIDAZOLE (FLAGYL) IVPB 500 mg     500 mg 100 mL/hr over 60 Minutes Intravenous  Once 08/18/2018 0715  07/29/2018 0908   07/27/2018 0730  vancomycin (VANCOCIN) IVPB 1000 mg/200 mL premix     1,000 mg 200 mL/hr over 60 Minutes Intravenous  Once 08/22/2018 0715 08/20/2018 0908      Microbiology: Results for orders placed or performed during the hospital encounter of 08/16/2018  Blood culture (routine x 2)     Status: None   Collection Time: 07/30/2018  7:46 AM   Specimen: BLOOD  Result Value Ref Range Status   Specimen Description BLOOD RFA  Final   Special Requests   Final    BOTTLES DRAWN AEROBIC AND ANAEROBIC Blood Culture results may not be optimal due to an excessive volume of blood received in culture bottles   Culture   Final    NO GROWTH 5 DAYS Performed at [facility], [address] [place], [place] [address]    Report Status 08/10/2018 FINAL  Final  Blood culture (routine x 2)     Status: None   Collection Time: 07/24/2018  7:47 AM   Specimen: BLOOD  Result Value Ref Range Status   Specimen Description BLOOD R AC  Final   Special Requests   Final    BOTTLES DRAWN AEROBIC AND ANAEROBIC Blood Culture results may not be optimal due to an excessive volume of blood received in culture  bottles   Culture   Final    NO GROWTH 5 DAYS Performed at [facility], [address] [place], [place] [address]    Report Status 08/10/2018 FINAL  Final  SARS Coronavirus 2 (CEPHEID- Performed in [place] hospital lab), Hosp Order     Status: None   Collection Time: 08/09/2018  8:34 AM   Specimen: Nasopharyngeal Swab  Result Value Ref Range Status   SARS Coronavirus 2 NEGATIVE NEGATIVE Final    Comment: (NOTE) If result is NEGATIVE SARS-CoV-2 target nucleic acids are NOT DETECTED. The SARS-CoV-2 RNA is generally detectable in upper and lower  respiratory specimens during the acute phase of infection. The lowest  concentration of SARS-CoV-2 viral copies this assay can detect is 250  copies / mL. A negative result does not preclude SARS-CoV-2 infection  and should not be used  as the sole basis for treatment or other  patient management decisions.  A negative result may occur with  improper specimen collection / handling, submission of specimen other  than nasopharyngeal swab, presence of viral mutation(s) within the  areas targeted by this assay, and inadequate number of viral copies  (<250 copies / mL). A negative result must be combined with clinical  observations, patient history, and epidemiological information. If result is POSITIVE SARS-CoV-2 target nucleic acids are DETECTED. The SARS-CoV-2 RNA is generally detectable in upper and lower  respiratory specimens dur ing the acute phase of infection.  Positive  results are indicative of active infection with SARS-CoV-2.  Clinical  correlation with patient history and other diagnostic information is  necessary to determine patient infection status.  Positive results do  not rule out bacterial infection or co-infection with other viruses. If result is PRESUMPTIVE POSTIVE SARS-CoV-2 nucleic acids MAY BE PRESENT.   A presumptive positive result was obtained on the submitted specimen  and confirmed on repeat testing.  While 2019 novel coronavirus  (SARS-CoV-2) nucleic acids may be present in the submitted sample  additional confirmatory testing may be necessary for epidemiological  and / or clinical management purposes  to differentiate between  SARS-CoV-2 and other Sarbecovirus currently known to infect humans.  If clinically indicated additional testing with an alternate test  methodology [phone]) is advised. The SARS-CoV-2 RNA is generally  detectable in upper and lower respiratory sp ecimens during the acute  phase of infection. The expected result is Negative. Fact Sheet for Patients:  StrictlyIdeas.no Fact Sheet for Healthcare Providers: BankingDealers.co.za This test is not yet approved or cleared by the [place] FDA and has been authorized for  detection and/or diagnosis of SARS-CoV-2 by FDA under an Emergency Use Authorization (EUA).  This EUA will remain in effect (meaning this test can be used) for the duration of the COVID-19 declaration under Section 564(b)(1) of the Act, 21 U.S.C. section 360bbb-3(b)(1), unless the authorization is terminated or revoked sooner. Performed at [facility], [address] [place], [place] [address]   MRSA PCR Screening     Status: None   Collection Time: 07/26/2018  1:50 PM   Specimen: Nasal Mucosa; Nasopharyngeal  Result Value Ref Range Status   MRSA by PCR NEGATIVE NEGATIVE Final    Comment:        The GeneXpert MRSA Assay (FDA approved for NASAL specimens only), is one component of a comprehensive MRSA colonization surveillance program. It is not intended to diagnose MRSA infection nor to guide or monitor treatment for MRSA infections. Performed at [facility], [address] [place], [place] [address]   CULTURE, BLOOD (ROUTINE X 2) w Reflex to ID Panel     Status: None (Preliminary result)   Collection Time: 08/09/18  3:24 AM   Specimen: BLOOD  Result Value Ref Range Status   Specimen Description BLOOD BLOOD RIGHT ARM  Final   Special Requests   Final    BOTTLES DRAWN AEROBIC AND ANAEROBIC Blood Culture adequate volume   Culture   Final    NO GROWTH 2 DAYS Performed at [facility], [address], [place], [place] [address]    Report Status PENDING  Incomplete  CULTURE, BLOOD (ROUTINE X 2) w Reflex to ID Panel     Status: None (Preliminary result)   Collection Time: 08/09/18  3:41 AM   Specimen: BLOOD  Result Value Ref Range Status   Specimen Description BLOOD RIGHT ANTECUBITAL  Final   Special Requests   Final    BOTTLES DRAWN AEROBIC ONLY Blood Culture results may not be optimal due to an inadequate volume of blood received in culture bottles   Culture   Final    NO GROWTH 2 DAYS Performed at [facility], [address] [place], [place] [address]    Report Status PENDING  Incomplete    Best Practice/Protocols:  VTE Prophylaxis: Heparin (CRRT) GI Prophylaxis: Proton Pump Inhibitor Hyperglycemia (ICU)  Events: 6/16 dialysis catheter right IJ 6/16 CRRT initiated 6/17 R Femoral CVL  Studies: Dg Chest 2 View  Result Date: 07/28/2018 CLINICAL DATA:  New cough and shortness of breath EXAM: CHEST - 2 VIEW COMPARISON:  May 10, 2018 FINDINGS: Vascular stent projects over the left subclavian region. Stable cardiomegaly. The hila, mediastinum, lungs, and pleura are normal. IMPRESSION: Persistent cardiomegaly.  No other abnormalities. Electronically Signed   By: Dorise Bullion III M.D   On: 07/30/2018 08:00   Dg Shoulder 1v Right  Result Date: 08/09/2018 CLINICAL DATA:  Right shoulder pain. EXAM: RIGHT SHOULDER - 1 VIEW COMPARISON:  None. FINDINGS: Single AP view of the shoulder. No fracture. Alignment is maintained. Mild acromioclavicular spurring. No bony destructive change or periosteal reaction. Soft tissues are unremarkable. IMPRESSION: Single portable AP view the shoulder demonstrating mild acromioclavicular degenerative change. No other explanation for pain. Electronically Signed   By: Keith Rake M.D.   On: 08/09/2018 03:52   Dg Abd 1 View  Result Date: 08/11/2018 CLINICAL DATA:  Abdominal distention. EXAM: ABDOMEN - 1 VIEW COMPARISON:  08/09/2018. FINDINGS: Surgical coils right upper quadrant. Oral contrast in the colon. No bowel distention. Aortoiliac atherosclerotic and peripheral vascular calcification. Pelvic calcifications consistent phleboliths. Degenerative changes thoracolumbar spine and both hips. No acute bony abnormality identified. Dual-lumen right IJ catheter noted with tip at cavoatrial junction. Left subclavian vascular stent noted. Cardiomegaly. No pulmonary venous congestion. IMPRESSION: 1. Surgical coils right upper quadrant again noted. No acute intra-abdominal abnormality identified.  No bowel distention or free air. 2. Dual-lumen right IJ catheter with tip at cavoatrial junction. Stable cardiomegaly. 3.  Aortoiliac and peripheral vascular disease. Electronically Signed   By: Marcello Moores  Register   On: 08/11/2018 06:00   Dg Abd 1 View  Result Date: 08/09/2018 CLINICAL DATA:  Abdominal discomfort and worsening lactic acidosis. EXAM: ABDOMEN - 1 VIEW COMPARISON:  Abdominal CT 2 days ago. FINDINGS: No bowel dilatation to suggest obstruction. Small bowel loops in the left abdomen with possible wall thickening. High-density material in the colon is seen on prior. No free air. Vascular coils in the central abdomen. There are  vascular calcifications. IMPRESSION: Suspect wall thickening of small bowel loops in the left lower abdomen, possible enteritis. No obstruction or free air. Electronically Signed   By: Keith Rake M.D.   On: 08/09/2018 03:51   Ct Head Wo Contrast  Result Date: 08/07/2018 CLINICAL DATA:  Altered mental status EXAM: CT HEAD WITHOUT CONTRAST TECHNIQUE: Contiguous axial images were obtained from the base of the skull through the vertex without intravenous contrast. COMPARISON:  05/04/2018 FINDINGS: Brain: No evidence of acute infarction, hemorrhage, hydrocephalus, extra-axial collection or mass lesion/mass effect. Vascular: No hyperdense vessel or unexpected calcification. Skull: Normal. Negative for fracture or focal lesion. Sinuses/Orbits: Small, partially imaged air-fluid level of the left maxillary sinus. Other: Contrast opacification of the vascular structures and meninges, likely due to persistent circulating contrast in the setting of renal failure and hemodialysis. IMPRESSION: 1.  No acute intracranial pathology. 2. Small, partially imaged air-fluid level of the left maxillary sinus. Correlate for evidence of sinusitis. 3. Contrast opacification of the vascular structures and meninges, likely due to persistent circulating contrast in the setting of renal failure and  hemodialysis. Electronically Signed   By: Eddie Candle M.D.   On: 08/07/2018 16:31   Ct Chest W Contrast  Result Date: 08/07/2018 CLINICAL DATA:  Chest pain and shortness of breath, abdominal pain and distension today. EXAM: CT CHEST, ABDOMEN, AND PELVIS WITH CONTRAST TECHNIQUE: Multidetector CT imaging of the chest, abdomen and pelvis was performed following the standard protocol during bolus administration of intravenous contrast. CONTRAST:  16mL OMNIPAQUE IOHEXOL 300 MG/ML  SOLN COMPARISON:  Chest CT 08/19/2018 and abdominal CT scan 05/04/2018 FINDINGS: CT CHEST FINDINGS Cardiovascular: The heart is enlarged but stable. No pericardial effusion. Stable tortuosity, ectasia and heavy calcification of the thoracic aorta. Stable three-vessel coronary artery calcifications. Stable left brachiocephalic vein stent. Enlarged pulmonary arteries consistent with pulmonary hypertension. Mediastinum/Nodes: Stable scattered mediastinal and hilar lymph nodes but no mass or overt adenopathy. The esophagus is grossly normal. Lungs/Pleura: Small to moderate-sized right effusion and small diffusion both larger when compared to prior recent chest CT. There is also overlying atelectasis. No overt pulmonary edema, focal pulmonary infiltrates or worrisome pulmonary lesions. Musculoskeletal: No chest wall masses identified. There is diffuse body wall edema noted suggesting anasarca. No supraclavicular or axillary lymphadenopathy the. The bony structures are unremarkable and appears stable. CT ABDOMEN PELVIS FINDINGS Hepatobiliary: Moderate reflux of contrast down the IVC and into the hepatic veins suggesting right heart failure or tricuspid regurgitation. Liver demonstrates changes suggesting hepatic congestion. No worrisome hepatic lesions or intrahepatic biliary dilatation. Contrast noted in the gallbladder related to the recent chest CT with contrast. Pancreas: No mass, inflammation or ductal dilatation. Spleen: Normal size.  No  focal lesions. Adrenals/Urinary Tract: The adrenal glands are unremarkable and stable. Small kidneys with extensive arterial calcifications. No worrisome renal lesions. The bladder is grossly normal. Stomach/Bowel: The stomach, duodenum, small bowel and colon are grossly normal. No obvious acute inflammatory process, mass lesion or obstructive findings. Vascular/Lymphatic: Heavy atherosclerotic calcifications involving the aorta and branch vessels, particularly the renal arteries. Stable scattered mesenteric and retroperitoneal lymph nodes but no mass or overt adenopathy. Reproductive: The prostate gland and seminal vesicles are unremarkable. Other: Diffuse mesenteric edema and small volume abdominal/pelvic ascites. The pelvic fluid demonstrates higher attenuation which could suggest hemorrhage or peritonitis. No obvious active extravasation of contrast material. Coils are noted in the region of the gastroduodenal artery from previous embolization procedure. Musculoskeletal: Severe/aggressive degenerative changes involving the lumbar spine. Amyloid spondyloarthropathy would certainly  be a consideration given the patient's history of renal dialysis. IMPRESSION: 1. Enlarging bilateral pleural effusions since the prior CT scan from 2 days ago. Moderate on the right and small the left with overlying atelectasis. 2. Stable cardiac enlargement and vascular congestion but no overt pulmonary edema. 3. Reflux of contrast down the IVC consistent with right heart failure or tricuspid regurgitation. Changes of hepatic congestion are noted. 4. Diffuse body wall edema, mesenteric edema and abdominal/pelvic ascites. The ascites in the abdomen/pelvis is high in attenuation suggesting complex fluid or hemorrhage. No obvious extravasating contrast is identified. 5. Extensive vascular calcifications. 6. Severe and aggressive degenerative changes involving the lumbar spine. Amyloid spondyloarthropathy would be a consideration.  Electronically Signed   By: Marijo Sanes M.D.   On: 08/07/2018 19:37   Ct Angio Chest Pe W And/or Wo Contrast  Result Date: 07/26/2018 CLINICAL DATA:  Chest pain. EXAM: CT ANGIOGRAPHY CHEST WITH CONTRAST TECHNIQUE: Multidetector CT imaging of the chest was performed using the standard protocol during bolus administration of intravenous contrast. Multiplanar CT image reconstructions and MIPs were obtained to evaluate the vascular anatomy. CONTRAST:  31mL OMNIPAQUE IOHEXOL 350 MG/ML SOLN COMPARISON:  Chest x-ray August 05, 2018.  Chest CT Jul 04, 2016. FINDINGS: Cardiovascular: Cardiomegaly is noted. A stent is seen in the left brachiocephalic vein. Patency cannot be evaluated due to lack of contrast in these vessels due to a right-sided injection. Atherosclerotic changes are seen in the aorta. The ascending thoracic aorta measures up to 4.4 cm, unchanged since May of 2018. No dissection identified. The main pulmonary artery measures 3.7 cm. No pulmonary emboli. Mediastinum/Nodes: The thyroid and esophagus are normal. No adenopathy. Tiny pleural effusions are noted. No pericardial effusion. Lungs/Pleura: Left retrocardiac atelectasis is noted. No other suspicious infiltrates. Central airways are normal. No pneumothorax. Upper Abdomen: No acute abnormality. Musculoskeletal: No chest wall abnormality. No acute or significant osseous findings. Review of the MIP images confirms the above findings. IMPRESSION: 1. No pulmonary emboli identified. 2. Cardiomegaly.  Tiny pleural effusions. 3. There is a stent in the left brachiocephalic vein. Patency cannot be evaluated due to a right-sided injection. 4. Atherosclerotic changes in the aorta. 5. 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 Recommend annual imaging followup by CTA or MRA. This recommendation follows 2010 ACCF/AHA/AATS/ACR/ASA/SCA/SCAI/SIR/STS/SVM Guidelines for the Diagnosis and Management of Patients with Thoracic  Aortic Disease. Circulation. 2010; 121: C166-A630. Aortic aneurysm NOS (ICD10-I71.9) 6. The main pulmonary artery measures 3.7 cm which raises the possibility of pulmonary arterial hypertension. 7. Mild left basilar atelectasis in the retrocardiac region. Aortic Atherosclerosis (ICD10-I70.0). Electronically Signed   By: Dorise Bullion III M.D   On: 08/16/2018 10:29   Ct Abdomen Pelvis W Contrast  Result Date: 08/07/2018 CLINICAL DATA:  Chest pain and shortness of breath, abdominal pain and distension today. EXAM: CT CHEST, ABDOMEN, AND PELVIS WITH CONTRAST TECHNIQUE: Multidetector CT imaging of the chest, abdomen and pelvis was performed following the standard protocol during bolus administration of intravenous contrast. CONTRAST:  128mL OMNIPAQUE IOHEXOL 300 MG/ML  SOLN COMPARISON:  Chest CT 07/27/2018 and abdominal CT scan 05/04/2018 FINDINGS: CT CHEST FINDINGS Cardiovascular: The heart is enlarged but stable. No pericardial effusion. Stable tortuosity, ectasia and heavy calcification of the thoracic aorta. Stable three-vessel coronary artery calcifications. Stable left brachiocephalic vein stent. Enlarged pulmonary arteries consistent with pulmonary hypertension. Mediastinum/Nodes: Stable scattered mediastinal and hilar lymph nodes but no mass or overt adenopathy. The esophagus is grossly normal. Lungs/Pleura: Small to  moderate-sized right effusion and small diffusion both larger when compared to prior recent chest CT. There is also overlying atelectasis. No overt pulmonary edema, focal pulmonary infiltrates or worrisome pulmonary lesions. Musculoskeletal: No chest wall masses identified. There is diffuse body wall edema noted suggesting anasarca. No supraclavicular or axillary lymphadenopathy the. The bony structures are unremarkable and appears stable. CT ABDOMEN PELVIS FINDINGS Hepatobiliary: Moderate reflux of contrast down the IVC and into the hepatic veins suggesting right heart failure or tricuspid  regurgitation. Liver demonstrates changes suggesting hepatic congestion. No worrisome hepatic lesions or intrahepatic biliary dilatation. Contrast noted in the gallbladder related to the recent chest CT with contrast. Pancreas: No mass, inflammation or ductal dilatation. Spleen: Normal size.  No focal lesions. Adrenals/Urinary Tract: The adrenal glands are unremarkable and stable. Small kidneys with extensive arterial calcifications. No worrisome renal lesions. The bladder is grossly normal. Stomach/Bowel: The stomach, duodenum, small bowel and colon are grossly normal. No obvious acute inflammatory process, mass lesion or obstructive findings. Vascular/Lymphatic: Heavy atherosclerotic calcifications involving the aorta and branch vessels, particularly the renal arteries. Stable scattered mesenteric and retroperitoneal lymph nodes but no mass or overt adenopathy. Reproductive: The prostate gland and seminal vesicles are unremarkable. Other: Diffuse mesenteric edema and small volume abdominal/pelvic ascites. The pelvic fluid demonstrates higher attenuation which could suggest hemorrhage or peritonitis. No obvious active extravasation of contrast material. Coils are noted in the region of the gastroduodenal artery from previous embolization procedure. Musculoskeletal: Severe/aggressive degenerative changes involving the lumbar spine. Amyloid spondyloarthropathy would certainly be a consideration given the patient's history of renal dialysis. IMPRESSION: 1. Enlarging bilateral pleural effusions since the prior CT scan from 2 days ago. Moderate on the right and small the left with overlying atelectasis. 2. Stable cardiac enlargement and vascular congestion but no overt pulmonary edema. 3. Reflux of contrast down the IVC consistent with right heart failure or tricuspid regurgitation. Changes of hepatic congestion are noted. 4. Diffuse body wall edema, mesenteric edema and abdominal/pelvic ascites. The ascites in the  abdomen/pelvis is high in attenuation suggesting complex fluid or hemorrhage. No obvious extravasating contrast is identified. 5. Extensive vascular calcifications. 6. Severe and aggressive degenerative changes involving the lumbar spine. Amyloid spondyloarthropathy would be a consideration. Electronically Signed   By: Marijo Sanes M.D.   On: 08/07/2018 19:37   Dg Chest Port 1 View  Result Date: 08/09/2018 CLINICAL DATA:  Acute respiratory failure. EXAM: PORTABLE CHEST 1 VIEW COMPARISON:  Radiograph yesterday. FINDINGS: Tip of the right central line in the lower SVC. Vascular stent in the region of brachiocephalic unchanged cardiomegaly. Small bilateral pleural effusions, similar to prior. Improved right lung base atelectasis. No acute airspace disease. No pneumothorax. IMPRESSION: 1. Unchanged small bilateral pleural effusions. Improved right basilar atelectasis. 2. Stable cardiomegaly. Electronically Signed   By: Keith Rake M.D.   On: 08/09/2018 03:49   Dg Chest Port 1 View  Result Date: 08/08/2018 CLINICAL DATA:  Central line placement EXAM: PORTABLE CHEST 1 VIEW COMPARISON:  08/19/2018 FINDINGS: The patient is status post placement of a right-sided central venous catheter. The tip is well position near the cavoatrial junction. The heart size remains enlarged. Aortic calcifications are noted. A vascular stent projects over the left brachiocephalic vein. There are small bilateral pleural effusions, left worse than right. There is an airspace opacity at the right lung base favored to represent atelectasis, however an infiltrate or aspiration is not excluded. There is no pneumothorax. IMPRESSION: 1. Well-positioned right-sided central venous catheter. No pneumothorax. 2. Cardiomegaly with  persistent small bilateral pleural effusions and generalized volume overload. Electronically Signed   By: Constance Holster M.D.   On: 08/08/2018 16:41   Ct Angio Chest/abd/pel For Dissection W And/or Wo  Contrast  Result Date: 08/19/2018 CLINICAL DATA:  Chest pain for 4 days.  Cough. EXAM: CT ANGIOGRAPHY CHEST, ABDOMEN AND PELVIS TECHNIQUE: Multidetector CT imaging through the chest, abdomen and pelvis was performed using the standard protocol during bolus administration of intravenous contrast. Multiplanar reconstructed images and MIPs were obtained and reviewed to evaluate the vascular anatomy. CONTRAST:  176mL ISOVUE-370 IOPAMIDOL (ISOVUE-370) INJECTION 76% COMPARISON:  CT of the chest August 05, 2018. CT of the abdomen and pelvis May 04, 2018 FINDINGS: CTA CHEST FINDINGS Cardiovascular: Cardiomegaly. Coronary artery calcifications identified. The pulmonary arteries were better assessed on the recent CT a of the chest for PE. No pulmonary emboli in the central pulmonary arteries. The patient had a normal PE study earlier today as well. Atherosclerotic changes are seen in the thoracic aorta without dissection. The ascending thoracic aorta measures 4.2 cm on today's study, mildly aneurysmal. Mediastinum/Nodes: There is a stent in the left brachiocephalic vein which is well opacified on this study. The thyroid and esophagus are normal. A borderline node anterior to the trachea measuring 14 mm on axial image 40 is stable since 2018, likely reactive. No other evidence of adenopathy identified. Small pleural effusions. No pericardial effusion. Mild increased attenuation in the subcutaneous fat suggests volume overload. Lungs/Pleura: Tiny pleural effusions. Central airways are normal. Mild atelectasis in the posterior left upper lobe. Mild atelectasis in the retrocardiac region, stable. No pneumothorax. Central airways are unchanged no suspicious pulmonary nodules, masses, or focal infiltrates. Musculoskeletal: There is a healed anterior left rib fracture. No acute bony abnormalities. Review of the MIP images confirms the above findings. CTA ABDOMEN AND PELVIS FINDINGS VASCULAR Aorta: Atherosclerotic change is seen in  the nonaneurysmal abdominal aorta, extending into the iliac and femoral vessels. No dissection. Celiac: Patent without evidence of aneurysm, dissection, vasculitis or significant stenosis. SMA: Atherosclerotic changes near the origin of the SMA without significant stenosis. No other abnormalities. Renals: Atherosclerosis at the origin of the bilateral renal arteries is identified. IMA: Patent without evidence of aneurysm, dissection, vasculitis or significant stenosis. Inflow: Patent without evidence of aneurysm, dissection, vasculitis or significant stenosis. Veins: No obvious venous abnormality within the limitations of this arterial phase study. Review of the MIP images confirms the above findings. NON-VASCULAR Hepatobiliary: No focal liver abnormality is seen. No gallstones, gallbladder wall thickening, or biliary dilatation. Pancreas: Unremarkable. No pancreatic ductal dilatation or surrounding inflammatory changes. Spleen: Normal in size without focal abnormality. Adrenals/Urinary Tract: Adrenal glands are normal. The kidneys are atrophic consistent with renal disease. No hydronephrosis or acute perinephric stranding. The ureters are normal. The bladder is poorly distended but unremarkable. Stomach/Bowel: The stomach and small bowel are unremarkable. The colon is normal. The appendix is unremarkable. Lymphatic: No significant vascular findings are present. No enlarged abdominal or pelvic lymph nodes. Reproductive: Prostate is unremarkable. Other: There is increased attenuation in the subcutaneous fat and in the intra-abdominal fat consistent volume overload. There is a small amount of fluid in the right pericolic gutter. Some of this fluid is adjacent to the appendix but there is no convincing evidence of appendicitis. Musculoskeletal: No acute or significant osseous findings. Review of the MIP images confirms the above findings. IMPRESSION: 1. Mild aneurysmal dilatation of the ascending thoracic aorta  measuring 4.2 cm on this study. This is stable since 2018. No  dissection. Atherosclerotic changes noted. Recommend annual imaging followup by CTA or MRA. This recommendation follows 2010 ACCF/AHA/AATS/ACR/ASA/SCA/SCAI/SIR/STS/SVM Guidelines for the Diagnosis and Management of Patients with Thoracic Aortic Disease. Circulation. 2010; 121: N829-F621. Aortic aneurysm NOS (ICD10-I71.9) 2. Cardiomegaly.  Coronary artery calcifications. 3. Increased attenuation in the subcutaneous fat diffusely as well as in the intra-abdominal fat with a small amount of fluid in the right pericolic gutter. These findings are most consistent with volume overload. 4. Tiny bilateral pleural effusions. Electronically Signed   By: Dorise Bullion III M.D   On: 07/27/2018 11:36    Consults: Treatment Team:  Minna Merritts, MD [facility], [facility], MD   Subjective:    Pt remains encephalopathic with intermittent agitation   Objective:  Vital signs for last 24 hours: Temp:  [96.8 F (36 C)-98.4 F (36.9 C)] 96.8 F (36 C) (06/19 1200) Pulse Rate:  [39-96] 64 (06/19 1230) Resp:  [5-17] 14 (06/19 1230) BP: (77-162)/(54-89) 96/64 (06/19 1230) SpO2:  [95 %-100 %] 100 % (06/19 1230) Weight:  [80.7 kg] 80.7 kg (06/19 0434)  Hemodynamic parameters for last 24 hours:    Intake/Output from previous day: 06/18 0701 - 06/19 0700 In: 997.1 [I.V.:647.1; IV Piggyback:350] Out: 561   Intake/Output this shift: Total I/O In: 93.5 [I.V.:93.5] Out: 72 [Other:72]  Vent settings for last 24 hours:    Physical Exam:  General:chronically ill appearing male,no respiratory distress, intermittently agitated Neuro: confused, follows commands when not agitated  HEENT: supple, no JVD, trachea midline, right IJ dialysis catheter with clean dressing Edentulous. Cardiovascular: nsr, rrr,  no R/G Lungs: diminished throughout, even, non labored  Abdomen: +BS x4, soft, non tender, non distended, right femoral CVL with clean  dressing. Musculoskeletal: trace bilateral lower extremity edema  Skin: left upper arm av fistula positive for bruit and thrill   Required right femoral CVL due to poor venous access.  Assessment/Plan:   New onset paroxysmal atrial flutter converted to sinus bradycardia  Elevated troponin secondary to demand ischemia vs. NSTEMI  Cardiogenic vs. Septic Shock  Stable angina reported recent cocaine use  Hx: Cocaine Abuse, CAD, MI, Chronic combined systolic and diastolic CHF Continuous telemetry monitoring  Maintain map >60, pressors Continue midodrine, hydrocortisone - shock dose  Will NOT start heparin drip per cardiology recommendations-pt not a candidate for anticoagulation due to profound GI Bleed hx 3 months ago at [facility] that led to cardiac arrest Hold antiarrhythmic and antihypertensive medications for now  Continue atorvastatin Analgesia as needed Once mentation improves will provide cocaine abuse cessation counseling   ESRD on HD, now on CRRT Lactic acidosis Trend renal panel Replace electrolytes as indicated  Avoid nephrotoxic medications Nephrology consulted appreciate input-continue CRRT per recommendations   History of recent GI bleed  VTE px: SCD's Trend CBC  Monitor for s/sx of bleeding and transfuse for hgb <7  Bronchitis  Trend WBC and monitor fever curve Follow cultures  Continue cefepime   Acute encephalopathy  Frequent reorientation   SUP: iv protonix   Family: Attempted to contact pts daughter and son via telephone to discuss plan of care, however they did not answer my phone call.  Marda Stalker, [place] Pager [phone] (please enter 7 digits) PCCM Consult Pager [phone] (please enter 7 digits)

## 2018-08-11 NOTE — Progress Notes (Signed)
[facility] [place] NAME: [name]    MR#:  [id number]  DATE OF BIRTH:  1955-10-29  SUBJECTIVE:   Patient remains critically ill on CRRT and hypothermic and on vasopressors.    REVIEW OF SYSTEMS:    Review of Systems  Unable to perform ROS: Mental acuity    Nutrition: NPO Tolerating Diet: NO Tolerating PT: await Eval.   DRUG ALLERGIES:   Allergies  Allergen Reactions  . Sulfa Antibiotics Shortness Of Breath, Itching and Rash    VITALS:  Blood pressure 95/66, pulse 68, temperature (!) 96.8 F (36 C), temperature source Rectal, resp. rate 18, height 5\' 8"  (1.727 m), weight 80.7 kg, SpO2 99 %.  PHYSICAL EXAMINATION:   Physical Exam  GENERAL:  63 y.o.-year-old patient lying in bed critically ill on vasopressors and CRRT.   EYES: Pupils equal, round, reactive to light. No scleral icterus.  HEENT: Head atraumatic, normocephalic. NECK:  Supple, no jugular venous distention. No thyroid enlargement, no tenderness.  LUNGS: Poor Resp. Effort,  no wheezing, rales, rhonchi. No use of accessory muscles of respiration.  CARDIOVASCULAR: S1, S2 normal. No murmurs, rubs, or gallops.  ABDOMEN: Soft, nontender, nondistended. Bowel sounds present. No organomegaly or mass.  EXTREMITIES: No cyanosis, clubbing, +1-2 edema b/l    NEUROLOGIC: Cranial nerves II through XII are intact. No focal Motor or sensory deficits b/l.  Globally weak.  PSYCHIATRIC: The patient is alert and oriented x 1.  SKIN: No obvious rash, lesion, or ulcer.    LABORATORY PANEL:   CBC Recent Labs  Lab 08/11/18 0429  WBC 10.9*  HGB 11.5*  HCT 35.8*  PLT 150   ------------------------------------------------------------------------------------------------------------------  Chemistries  Recent Labs  Lab 08/07/18 1513  08/11/18 0429 08/11/18 0817  NA  --    < > 136 137  K  --    < > 3.9 3.8  CL  --    < > 103 104  CO2  --    < > 21* 23  GLUCOSE  --     < > 181* 170*  BUN  --    < > 32* 32*  CREATININE  --    < > 2.40* 2.25*  CALCIUM  --    < > 9.1 9.1  MG  --    < > 2.1  --   AST 51*  --   --   --   ALT 27  --   --   --   ALKPHOS 87  --   --   --   BILITOT 1.0  --   --   --    < > = values in this interval not displayed.   ------------------------------------------------------------------------------------------------------------------  Cardiac Enzymes Recent Labs  Lab 08/08/18 0957  TROPONINI 1.73*   ------------------------------------------------------------------------------------------------------------------  RADIOLOGY:  Dg Abd 1 View  Result Date: 08/11/2018 CLINICAL DATA:  Abdominal distention. EXAM: ABDOMEN - 1 VIEW COMPARISON:  08/09/2018. FINDINGS: Surgical coils right upper quadrant. Oral contrast in the colon. No bowel distention. Aortoiliac atherosclerotic and peripheral vascular calcification. Pelvic calcifications consistent phleboliths. Degenerative changes thoracolumbar spine and both hips. No acute bony abnormality identified. Dual-lumen right IJ catheter noted with tip at cavoatrial junction. Left subclavian vascular stent noted. Cardiomegaly. No pulmonary venous congestion. IMPRESSION: 1. Surgical coils right upper quadrant again noted. No acute intra-abdominal abnormality identified. No bowel distention or free air. 2. Dual-lumen right IJ catheter with tip at cavoatrial junction. Stable cardiomegaly. 3.  Aortoiliac and peripheral vascular disease. Electronically Signed   By: Marcello Moores  Register   On: 08/11/2018 06:00     ASSESSMENT AND PLAN:   63 year old male with past medical history of end-stage renal disease on hemodialysis, pulmonary hypertension, ischemic cardiomyopathy, chronic combined systolic diastolic CHF, substance abuse.    1.  Hypotension-etiology unclear.  Questionable septic versus hypovolemic shock. - Patient remains on vasopressors.  Cont. Levophed. -Continue stress dose steroids, follow  hemodynamics.  Patient is afebrile, cultures are currently negative.  Continue empiric cefepime.  2.  Atrial fibrillation/flutter-new onset for the patient given the critical illness and severe hypotension. -Seen by cardiology and difficult to control rate given the severe hypotension cannot give beta-blocker or calcium channel blockers. - Hold off on long-term anticoagulation given previous history of GI bleed.  3.  End-stage renal disease on hemodialysis- hemodynamically unstable and therefore remains on CRRT. -Continue further care as per nephrology.  4.  Secondary hyperparathyroidism-continue PhosLo, Sensipar  5.  Hyperlipidemia-continue atorvastatin.  6.  Previous history of GI bleed- no acute bleeding presently.  Follow hemoglobin.  Patient's prognosis is quite poor given his multiple comorbidities.  Palliative care consult obtained to discuss goals of care and they did talk to the patient's son who wants the patient to be a full code for now.  All the records are reviewed and case discussed with Care Management/Social Worker. Management plans discussed with the patient, family and they are in agreement.  CODE STATUS: Full code  DVT Prophylaxis: Teds and SCDs  TOTAL TIME TAKING CARE OF THIS PATIENT: 30 minutes.   POSSIBLE D/C unclear DAYS, DEPENDING ON CLINICAL CONDITION and progress.   Henreitta Leber M.D on 08/11/2018 at 3:36 PM  Between 7am to 6pm - Pager - [phone]  After 6pm go to [email or web address] - Technical brewer [facility]  Office  [phone]  CC: Primary care physician; Donnie Coffin, MD

## 2018-08-11 NOTE — Progress Notes (Signed)
[facility]  ROUNDING NOTE   Subjective:   CRRT 4K bath. UF 75mL/hr  Norepinephrine 74mcg  Objective:  Vital signs in last 24 hours:  Temp:  [97 F (36.1 C)-98.4 F (36.9 C)] 97.5 F (36.4 C) (06/19 0800) Pulse Rate:  [39-96] 73 (06/19 0800) Resp:  [8-17] 9 (06/19 0800) BP: (77-162)/(52-89) 113/73 (06/19 0800) SpO2:  [97 %-100 %] 100 % (06/19 0800) Weight:  [80.7 kg] 80.7 kg (06/19 0434)  Weight change: 1.2 kg Filed Weights   08/09/18 0348 08/10/18 0424 08/11/18 0434  Weight: 79.8 kg 79.5 kg 80.7 kg    Intake/Output: I/O last 3 completed shifts: In: 1366.3 [I.V.:816; IV Piggyback:550.4] Out: 851 [Other:851]   Intake/Output this shift:  Total I/O In: 53.5 [I.V.:53.5] Out: 24 [Other:24]  Physical Exam: General: Critically ill  Head: Normocephalic, atraumatic. Dry oral mucosal membranes  Eyes: Anicteric  Neck: Supple, trachea midline  Lungs:  Clear to auscultation, normal effort  Heart:  irregular, bradycardia  Abdomen:  Soft, nontender, bowel sounds present  Extremities: + peripheral edema.  Neurologic: Alert to self  Skin: No lesions  Access: LUE AVF +bruit and thrill, RIJ temp HD catheter 6/16 Dr. Mortimer Fries    Basic Metabolic Panel: Recent Labs  Lab 08/10/18 1305 08/10/18 1608 08/10/18 1921 08/10/18 2347 08/11/18 0429 08/11/18 0817  NA 130* 133* 132* 132* 136 137  K 3.3* 3.7 3.6 3.8 3.9 3.8  CL 95* 99 96* 98 103 104  CO2 25 23 24 23  21* 23  GLUCOSE 188* 183* 181* 183* 181* 170*  BUN 32* 31* 32* 31* 32* 32*  CREATININE 2.84* 2.75* 2.62* 2.46* 2.40* 2.25*  CALCIUM 8.5* 8.9 8.7* 8.7* 9.1 9.1  MG 2.3 2.1 2.1 2.1 2.1  --   PHOS 5.5* 5.3* 5.3* 5.0* 4.7* 4.5    Liver Function Tests: Recent Labs  Lab 07/26/2018 0710 08/07/18 1513  08/10/18 1608 08/10/18 1921 08/10/18 2347 08/11/18 0429 08/11/18 0817  AST 26 51*  --   --   --   --   --   --   ALT 21 27  --   --   --   --   --   --   ALKPHOS 81 87  --   --   --   --   --   --   BILITOT  1.0 1.0  --   --   --   --   --   --   PROT 7.4 7.9  --   --   --   --   --   --   ALBUMIN 3.4* 3.4*   < > 2.7* 2.8* 2.7* 2.7* 2.7*   < > = values in this interval not displayed.   Recent Labs  Lab 08/07/18 1513  LIPASE 45   No results for input(s): AMMONIA in the last 168 hours.  CBC: Recent Labs  Lab 07/31/2018 0710 08/06/18 0045 08/07/18 2041 08/08/18 0957 08/09/18 0323 08/10/18 0431 08/11/18 0429  WBC 7.3 5.9  --  9.9 12.7* 13.1* 10.9*  NEUTROABS 4.5  --   --  7.8*  --  12.2* 10.2*  HGB 10.1* 10.4* 11.7* 11.5* 10.5* 10.6* 11.5*  HCT 31.6* 32.9*  --  37.6* 34.0* 32.6* 35.8*  MCV 87.8 88.0  --  89.7 88.8 85.1 86.3  PLT 150 114*  --  108* 95* 118* 150    Cardiac Enzymes: Recent Labs  Lab 08/06/18 0045 08/06/18 0303 08/07/18 1855 08/08/18 0115 08/08/18 0957  TROPONINI  0.18* 0.22* 2.05* 1.92* 1.73*    BNP: Invalid input(s): POCBNP  CBG: Recent Labs  Lab 08/10/18 1605 08/10/18 1920 08/10/18 2347 08/11/18 0431 08/11/18 0754  GLUCAP 170* 163* 166* 157* 160*    Microbiology: Results for orders placed or performed during the hospital encounter of 07/30/2018  Blood culture (routine x 2)     Status: None   Collection Time: 08/22/2018  7:46 AM   Specimen: BLOOD  Result Value Ref Range Status   Specimen Description BLOOD RFA  Final   Special Requests   Final    BOTTLES DRAWN AEROBIC AND ANAEROBIC Blood Culture results may not be optimal due to an excessive volume of blood received in culture bottles   Culture   Final    NO GROWTH 5 DAYS Performed at [facility], [address], [place], [place] [address]    Report Status 08/10/2018 FINAL  Final  Blood culture (routine x 2)     Status: None   Collection Time: 07/24/2018  7:47 AM   Specimen: BLOOD  Result Value Ref Range Status   Specimen Description BLOOD R AC  Final   Special Requests   Final    BOTTLES DRAWN AEROBIC AND ANAEROBIC Blood Culture results may not be optimal due to an excessive volume  of blood received in culture bottles   Culture   Final    NO GROWTH 5 DAYS Performed at [facility], [address], [place], [place] [address]    Report Status 08/10/2018 FINAL  Final  SARS Coronavirus 2 (CEPHEID- Performed in [place] hospital lab), Hosp Order     Status: None   Collection Time: 08/18/2018  8:34 AM   Specimen: Nasopharyngeal Swab  Result Value Ref Range Status   SARS Coronavirus 2 NEGATIVE NEGATIVE Final    Comment: (NOTE) If result is NEGATIVE SARS-CoV-2 target nucleic acids are NOT DETECTED. The SARS-CoV-2 RNA is generally detectable in upper and lower  respiratory specimens during the acute phase of infection. The lowest  concentration of SARS-CoV-2 viral copies this assay can detect is 250  copies / mL. A negative result does not preclude SARS-CoV-2 infection  and should not be used as the sole basis for treatment or other  patient management decisions.  A negative result may occur with  improper specimen collection / handling, submission of specimen other  than nasopharyngeal swab, presence of viral mutation(s) within the  areas targeted by this assay, and inadequate number of viral copies  (<250 copies / mL). A negative result must be combined with clinical  observations, patient history, and epidemiological information. If result is POSITIVE SARS-CoV-2 target nucleic acids are DETECTED. The SARS-CoV-2 RNA is generally detectable in upper and lower  respiratory specimens dur ing the acute phase of infection.  Positive  results are indicative of active infection with SARS-CoV-2.  Clinical  correlation with patient history and other diagnostic information is  necessary to determine patient infection status.  Positive results do  not rule out bacterial infection or co-infection with other viruses. If result is PRESUMPTIVE POSTIVE SARS-CoV-2 nucleic acids MAY BE PRESENT.   A presumptive positive result was obtained on the submitted specimen  and  confirmed on repeat testing.  While 2019 novel coronavirus  (SARS-CoV-2) nucleic acids may be present in the submitted sample  additional confirmatory testing may be necessary for epidemiological  and / or clinical management purposes  to differentiate between  SARS-CoV-2 and other Sarbecovirus currently known to infect humans.  If  clinically indicated additional testing with an alternate test  methodology [phone]) is advised. The SARS-CoV-2 RNA is generally  detectable in upper and lower respiratory sp ecimens during the acute  phase of infection. The expected result is Negative. Fact Sheet for Patients:  StrictlyIdeas.no Fact Sheet for Healthcare Providers: BankingDealers.co.za This test is not yet approved or cleared by the [place] FDA and has been authorized for detection and/or diagnosis of SARS-CoV-2 by FDA under an Emergency Use Authorization (EUA).  This EUA will remain in effect (meaning this test can be used) for the duration of the COVID-19 declaration under Section 564(b)(1) of the Act, 21 U.S.C. section 360bbb-3(b)(1), unless the authorization is terminated or revoked sooner. Performed at [facility], [address] [place], [place] [address]   MRSA PCR Screening     Status: None   Collection Time: 08/18/2018  1:50 PM   Specimen: Nasal Mucosa; Nasopharyngeal  Result Value Ref Range Status   MRSA by PCR NEGATIVE NEGATIVE Final    Comment:        The GeneXpert MRSA Assay (FDA approved for NASAL specimens only), is one component of a comprehensive MRSA colonization surveillance program. It is not intended to diagnose MRSA infection nor to guide or monitor treatment for MRSA infections. Performed at [facility], [address] [place], [place] [address]   CULTURE, BLOOD (ROUTINE X 2) w Reflex to ID Panel     Status: None (Preliminary result)   Collection Time: 08/09/18  3:24 AM   Specimen:  BLOOD  Result Value Ref Range Status   Specimen Description BLOOD BLOOD RIGHT ARM  Final   Special Requests   Final    BOTTLES DRAWN AEROBIC AND ANAEROBIC Blood Culture adequate volume   Culture   Final    NO GROWTH 2 DAYS Performed at [facility], [address], [place], [place] [address]    Report Status PENDING  Incomplete  CULTURE, BLOOD (ROUTINE X 2) w Reflex to ID Panel     Status: None (Preliminary result)   Collection Time: 08/09/18  3:41 AM   Specimen: BLOOD  Result Value Ref Range Status   Specimen Description BLOOD RIGHT ANTECUBITAL  Final   Special Requests   Final    BOTTLES DRAWN AEROBIC ONLY Blood Culture results may not be optimal due to an inadequate volume of blood received in culture bottles   Culture   Final    NO GROWTH 2 DAYS Performed at [facility], [address] [place], [place] [address]    Report Status PENDING  Incomplete    Coagulation Studies: No results for input(s): LABPROT, INR in the last 72 hours.  Urinalysis: No results for input(s): COLORURINE, LABSPEC, PHURINE, GLUCOSEU, HGBUR, BILIRUBINUR, KETONESUR, PROTEINUR, UROBILINOGEN, NITRITE, LEUKOCYTESUR in the last 72 hours.  Invalid input(s): APPERANCEUR    Imaging: Dg Abd 1 View  Result Date: 08/11/2018 CLINICAL DATA:  Abdominal distention. EXAM: ABDOMEN - 1 VIEW COMPARISON:  08/09/2018. FINDINGS: Surgical coils right upper quadrant. Oral contrast in the colon. No bowel distention. Aortoiliac atherosclerotic and peripheral vascular calcification. Pelvic calcifications consistent phleboliths. Degenerative changes thoracolumbar spine and both hips. No acute bony abnormality identified. Dual-lumen right IJ catheter noted with tip at cavoatrial junction. Left subclavian vascular stent noted. Cardiomegaly. No pulmonary venous congestion. IMPRESSION: 1. Surgical coils right upper quadrant again noted. No acute intra-abdominal abnormality identified. No bowel distention or free  air. 2. Dual-lumen right IJ catheter with tip at cavoatrial junction. Stable cardiomegaly. 3.  Aortoiliac and peripheral vascular disease. Electronically Signed   By: Marcello Moores  Register   On: 08/11/2018 06:00     Medications:   . sodium chloride 250 mL (08/10/18 1713)  . ceFEPime (MAXIPIME) IV Stopped (08/10/18 2146)  . norepinephrine (LEVOPHED) 16mg  / 262mL infusion 1 mcg/min (08/11/18 0831)  . pureflow 2,000 mL/hr at 08/11/18 0516   . aspirin EC  81 mg Oral Daily  . atorvastatin  40 mg Oral Daily  . calcium acetate  2,001 mg Oral TID WC  . chlorhexidine  15 mL Mouth Rinse BID  . Chlorhexidine Gluconate Cloth  6 each Topical Daily  . cinacalcet  30 mg Oral 3 times weekly  . docusate sodium  100 mg Oral Daily  . hydrocortisone sod succinate (SOLU-CORTEF) inj  50 mg Intravenous Q12H  . insulin aspart  0-15 Units Subcutaneous Q4H  . mouth rinse  15 mL Mouth Rinse q12n4p  . midodrine  10 mg Oral TID WC  . pantoprazole (PROTONIX) IV  40 mg Intravenous Q24H  . sodium chloride flush  10-40 mL Intracatheter Q12H  . sodium chloride flush  3 mL Intravenous Q12H   sodium chloride, acetaminophen **OR** acetaminophen, diphenhydrAMINE, heparin, hydrOXYzine, ipratropium-albuterol, morphine injection, nitroGLYCERIN, [DISCONTINUED] ondansetron **OR** ondansetron (ZOFRAN) IV, oxyCODONE-acetaminophen, senna-docusate, sodium chloride flush, sodium chloride flush  Assessment/ Plan:  [name] is a 63 y.o. black male with end stage renal disease on hemodialysis, hypertension, hepatitis C, anemia, seizure disorder, COPD/tobacco abuse,substance abuse (cocaine) and alcohol abuse,secondary hyperparathyroidism, chronic systolic congestive heart failure admitted with chest pain and atrial flutter. Complicated by bradycardia. Transferred to ICU on 6/15 for worsening status, hypoglycemia, hypotension, sepsis and hyperkalemia.  Placed on CRRT on 6/16. Required up to three vasopressors   [facility] MWF [facility] [address] [facility]Left arm AVG. 240 minutes 72kg  1.  ESRD on HD MWF.   Requiring vasopressors: currently on norepinephrine  Not hemodynamically stable for intermittent hemodialysis treatment.  - Continue CVVHD 4K bath  2.  Anemia of chronic kidney disease.  Hemoglobin 11.5 Holding EPO due to concerns of dissection/ischemia.   3.  Secondary hyperparathyroidism with hyperphophatemia. PTH elevated at 1178 on 07/31/2018 - Discontinue all PO binders and PO cinacalcet  4.  Shock with hypotension: cardiogenic with Chest pain/atrial flutter/bradycardia.   Outpatient baseline blood pressures are 90s/60s.  With demand ischemia. However also with elevated lactic acid level and procalcitonin levels. Concern for underlying sepsis.  - Empiric cefepime - Weaing off vasopressors.   5. Diabetes mellitus type II with chronic kidney disease: with prolonged hypoglycemia   LOS: 6 Abigal Choung 6/19/20209:04 AM

## 2018-08-11 NOTE — Progress Notes (Signed)
Pt has been very agitated throughout shift intermittently, at times being combative with staff, hitting them as he attempts to get out of bed. Speech largely incomprehensible or pt just moans. Pt on precedex gtt and given morphine PRN when it seems pt may be in pain. CRRT has run without difficulty this shift.  Pt has been hypothermic this shift and gets very agitated when [employer] applied. Rectal probe reading 35.8, Dana, NP aware.

## 2018-08-11 NOTE — Progress Notes (Signed)
Updated pts son [name] via telephone regarding pts current condition, plan of care, and prognosis.  All questions were answered.  I attempted to contact pts daughter [name] via telephone, however she did not answer.  [name] stated he would update his sister [name] regarding his fathers plan of care and current condition.  [name], [place] Pager [phone] (please enter 7 digits) PCCM Consult Pager [phone] (please enter 7 digits)

## 2018-08-11 NOTE — Progress Notes (Signed)
Pink foams placed on bilateral heels as prophylactic measure since pt will not leave pillows under legs to float heels and is at times rubbing heels on mattress.

## 2018-08-11 NOTE — Progress Notes (Signed)
Pharmacy CRRT Medication Monitoring and Adjustment Consult:  Pharmacy consulted to assist in monitoring and replacing electrolytes in this 63 y.o. male admitted on 08/10/2018 with Chest Pain   Labs:  Sodium (mmol/L)  Date Value  08/11/2018 137  04/30/2013 139   Potassium (mmol/L)  Date Value  08/11/2018 3.8  04/30/2013 4.4   Magnesium (mg/dL)  Date Value  08/11/2018 2.1  11/05/2012 1.5 (L)   Phosphorus (mg/dL)  Date Value  08/11/2018 4.5  04/30/2013 4.6   Calcium (mg/dL)  Date Value  08/11/2018 9.1   Calcium, Total (mg/dL)  Date Value  04/30/2013 8.0 (L)   Albumin (g/dL)  Date Value  08/11/2018 2.7 (L)  04/30/2013 2.6 (L)   Corrected Calcium 9.04  Assessment: -Patient on CRRT 4K bath. Potassium 3.8.  -Per nephrology, continue 4K CVVHD -Magnesium is within normal limits -Per nephrology, d/c phosphate binders and cinacalcet-monitor both labs for potential increase  Plan: No adjustments at this time.  Pharmacy will continue to monitor and adjust per consult.   Marisa Cyphers, PharmD Candidate 08/11/2018 10:50 AM

## 2018-08-12 LAB — RENAL FUNCTION PANEL
Albumin: 2.8 g/dL — ABNORMAL LOW (ref 3.5–5.0)
Albumin: 2.9 g/dL — ABNORMAL LOW (ref 3.5–5.0)
Anion gap: 12 (ref 5–15)
Anion gap: 17 — ABNORMAL HIGH (ref 5–15)
BUN: 29 mg/dL — ABNORMAL HIGH (ref 8–23)
BUN: 29 mg/dL — ABNORMAL HIGH (ref 8–23)
CO2: 22 mmol/L (ref 22–32)
CO2: 17 mmol/L — ABNORMAL LOW (ref 22–32)
Calcium: 9.4 mg/dL (ref 8.9–10.3)
Calcium: 9.3 mg/dL (ref 8.9–10.3)
Chloride: 102 mmol/L (ref 98–111)
Chloride: 99 mmol/L (ref 98–111)
Creatinine, Ser: 1.95 mg/dL — ABNORMAL HIGH (ref 0.61–1.24)
Creatinine, Ser: 1.65 mg/dL — ABNORMAL HIGH (ref 0.61–1.24)
GFR calc Af Amer: 41 mL/min — ABNORMAL LOW (ref >60)
GFR calc Af Amer: 50 mL/min — ABNORMAL LOW (ref >60)
GFR calc non Af Amer: 35 mL/min — ABNORMAL LOW (ref >60)
GFR calc non Af Amer: 43 mL/min — ABNORMAL LOW (ref >60)
Glucose, Bld: 125 mg/dL — ABNORMAL HIGH (ref 70–99)
Glucose, Bld: 161 mg/dL — ABNORMAL HIGH (ref 70–99)
Phosphorus: 3.6 mg/dL (ref 2.5–4.6)
Phosphorus: 3.6 mg/dL (ref 2.5–4.6)
Potassium: 4.1 mmol/L (ref 3.5–5.1)
Potassium: 4 mmol/L (ref 3.5–5.1)
Sodium: 136 mmol/L (ref 135–145)
Sodium: 133 mmol/L — ABNORMAL LOW (ref 135–145)

## 2018-08-12 LAB — CBC WITH DIFFERENTIAL/PLATELET
Abs Immature Granulocytes: 0.05 10*3/uL (ref 0.00–0.07)
Basophils Absolute: 0 10*3/uL (ref 0.0–0.1)
Basophils Relative: 0 %
Eosinophils Absolute: 0 10*3/uL (ref 0.0–0.5)
Eosinophils Relative: 0 %
HCT: 35.9 % — ABNORMAL LOW (ref 39.0–52.0)
Hemoglobin: 11.2 g/dL — ABNORMAL LOW (ref 13.0–17.0)
Immature Granulocytes: 1 %
Lymphocytes Relative: 3 %
Lymphs Abs: 0.3 10*3/uL — ABNORMAL LOW (ref 0.7–4.0)
MCH: 27.4 pg (ref 26.0–34.0)
MCHC: 31.2 g/dL (ref 30.0–36.0)
MCV: 87.8 fL (ref 80.0–100.0)
Monocytes Absolute: 0.6 10*3/uL (ref 0.1–1.0)
Monocytes Relative: 6 %
Neutro Abs: 8.7 10*3/uL — ABNORMAL HIGH (ref 1.7–7.7)
Neutrophils Relative %: 90 %
Platelets: 115 10*3/uL — ABNORMAL LOW (ref 150–400)
RBC: 4.09 MIL/uL — ABNORMAL LOW (ref 4.22–5.81)
RDW: 19.7 % — ABNORMAL HIGH (ref 11.5–15.5)
WBC: 9.7 10*3/uL (ref 4.0–10.5)
nRBC: 0.7 % — ABNORMAL HIGH (ref 0.0–0.2)

## 2018-08-12 LAB — GLUCOSE, CAPILLARY
Glucose-Capillary: 106 mg/dL — ABNORMAL HIGH (ref 70–99)
Glucose-Capillary: 136 mg/dL — ABNORMAL HIGH (ref 70–99)
Glucose-Capillary: 151 mg/dL — ABNORMAL HIGH (ref 70–99)
Glucose-Capillary: 64 mg/dL — ABNORMAL LOW (ref 70–99)
Glucose-Capillary: 67 mg/dL — ABNORMAL LOW (ref 70–99)
Glucose-Capillary: 75 mg/dL (ref 70–99)
Glucose-Capillary: 95 mg/dL (ref 70–99)
Glucose-Capillary: 99 mg/dL (ref 70–99)

## 2018-08-12 LAB — APTT: aPTT: 35 seconds (ref 24–36)

## 2018-08-12 LAB — MAGNESIUM
Magnesium: 2.1 mg/dL (ref 1.7–2.4)
Magnesium: 2.1 mg/dL (ref 1.7–2.4)

## 2018-08-12 MED ORDER — HALOPERIDOL LACTATE 5 MG/ML IJ SOLN
2.0000 mg | Freq: Four times a day (QID) | INTRAMUSCULAR | Status: DC | PRN
  Administered 2018-08-12 – 2018-08-13 (×3): 2 mg via INTRAVENOUS
  Filled 2018-08-12 (×2): qty 1

## 2018-08-12 MED ORDER — HALOPERIDOL LACTATE 5 MG/ML IJ SOLN
2.0000 mg | Freq: Once | INTRAMUSCULAR | Status: AC
  Administered 2018-08-12: 2 mg via INTRAVENOUS

## 2018-08-12 MED ORDER — HYDROCORTISONE NA SUCCINATE PF 100 MG IJ SOLR
25.0000 mg | Freq: Two times a day (BID) | INTRAMUSCULAR | Status: DC
  Administered 2018-08-12: 25 mg via INTRAVENOUS
  Filled 2018-08-12: qty 2

## 2018-08-12 MED ORDER — HALOPERIDOL LACTATE 5 MG/ML IJ SOLN
1.0000 mg | Freq: Once | INTRAMUSCULAR | Status: AC
  Administered 2018-08-12: 1 mg via INTRAVENOUS
  Filled 2018-08-12: qty 1

## 2018-08-12 MED ORDER — DEXTROSE 5 % IV SOLN
INTRAVENOUS | Status: DC
  Administered 2018-08-12: 21:00:00 via INTRAVENOUS

## 2018-08-12 MED ORDER — DEXTROSE 50 % IV SOLN
25.0000 mL | Freq: Once | INTRAVENOUS | Status: AC
  Administered 2018-08-12 – 2018-08-13 (×2): 25 mL via INTRAVENOUS

## 2018-08-12 MED ORDER — HALOPERIDOL LACTATE 5 MG/ML IJ SOLN
INTRAMUSCULAR | Status: AC
  Filled 2018-08-12: qty 2

## 2018-08-12 NOTE — Progress Notes (Signed)
PULMONARY/CCM PROGRESS NOTE  PT PROFILE: 63 y.o. M with ESRD, CAD, PVD, DM II, chronic cocaine abuse, Hep C, chronic AF/flutter, prior massive GI bleeds adm 06/13 via ED with chest pain, cough, shortness of breath, hypotension. Trop I elevated (peak 1.92). PCT mildly elevated - peak 2.78. Concern for possible sepsis but no clear source  MAJOR EVENTS/TEST RESULTS: 06/14 Echo: LVEF 55%.  Mildly increased LV wall thickness.  LA moderately dilated.  RV moderately dilated.  RVSP estimate 44 mmHg. 06/15 CT chest: Bilateral R >L pleural effusions with compressive atelectasis  06/15 CT head: No acute intracranial findings 06/16 CRRT initiated  INDWELLING DEVICES:: R IJ HD cath 06/16 >>  06/17 R femoral CVL 06/17 >>   MICRO DATA: All micro data has been reviewed.  No significant positive results  ANTIMICROBIALS:  Anti-infectives (From admission, onward)   Start     Dose/Rate Route Frequency Ordered Stop   08/09/18 0515  metroNIDAZOLE (FLAGYL) IVPB 500 mg  Status:  Discontinued     500 mg 100 mL/hr over 60 Minutes Intravenous Every 8 hours 08/09/18 0506 08/10/18 1105   08/08/18 2200  ceFEPIme (MAXIPIME) 2 g in sodium chloride 0.9 % 100 mL IVPB  Status:  Discontinued     2 g 200 mL/hr over 30 Minutes Intravenous Every 12 hours 08/08/18 1335 08/12/18 1118   08/07/18 1200  ceFEPIme (MAXIPIME) 2 g in sodium chloride 0.9 % 100 mL IVPB  Status:  Discontinued     2 g 200 mL/hr over 30 Minutes Intravenous Every M-W-F (Hemodialysis) 08/02/2018 1250 08/08/18 1335   08/07/18 1200  vancomycin (VANCOCIN) IVPB 750 mg/150 ml premix  Status:  Discontinued     750 mg 150 mL/hr over 60 Minutes Intravenous Every M-W-F (Hemodialysis) 07/29/2018 1250 08/07/18 2155   08/20/2018 1415  vancomycin (VANCOCIN) IVPB 750 mg/150 ml premix     750 mg 150 mL/hr over 60 Minutes Intravenous  Once 07/25/2018 1250 08/10/2018 1600   07/31/2018 0730  ceFEPIme (MAXIPIME) 2 g in sodium chloride 0.9 % 100 mL IVPB     2 g 200 mL/hr over 30  Minutes Intravenous  Once 08/03/2018 0715 08/20/2018 0826   08/16/2018 0730  metroNIDAZOLE (FLAGYL) IVPB 500 mg     500 mg 100 mL/hr over 60 Minutes Intravenous  Once 07/25/2018 0715 08/11/2018 0908   08/11/2018 0730  vancomycin (VANCOCIN) IVPB 1000 mg/200 mL premix     1,000 mg 200 mL/hr over 60 Minutes Intravenous  Once 08/08/2018 0715 07/25/2018 0908       SUBJ: Intermittently very agitated.  On dexmedetomidine infusion.  Requiring low-dose norepinephrine.  Remains on CRRT.  Poorly oriented but tells me he is in the hospital.  At one point became combative and appeared to respond favorably to haloperidol.    OBJ: Vitals:   08/12/18 1100 08/12/18 1200 08/12/18 1300 08/12/18 1400  BP:  91/71 (!) 87/62 (!) 87/75  Pulse: 79 90  80  Resp: _0 Temp:  97.9 F (36.6 C)    TempSrc:  Axillary    SpO2: 96% 99% 90% 100%  Weight:      Height:      2 LPM [place]  Gen: Chronically ill-appearing, poorly oriented, intermittently agitated and combative, no respiratory distress HEENT: NCAT, sclerae white Neck: No LAN, no JVD noted Lungs: Clear anteriorly Cardiovascular: Regular (sinus rhythm), no M noted Abdomen: Soft, NT, +BS Ext: Warm, no edema Neuro: No focal deficits noted Skin: No lesions noted   BMP Latest  Ref Rng & Units 08/12/2018 08/11/2018 08/11/2018  Glucose 70 - 99 mg/dL 125(H) 140(H) 170(H)  BUN 8 - 23 mg/dL 29(H) 31(H) 32(H)  Creatinine 0.61 - 1.24 mg/dL 1.95(H) 2.17(H) 2.25(H)  Sodium 135 - 145 mmol/L 136 134(L) 137  Potassium 3.5 - 5.1 mmol/L 4.1 4.0 3.8  Chloride 98 - 111 mmol/L 102 99 104  CO2 22 - 32 mmol/L _0 Calcium 8.9 - 10.3 mg/dL 9.4 9.0 9.1    Hepatic Function Latest Ref Rng & Units 08/12/2018 08/11/2018 08/11/2018  Total Protein 6.5 - 8.1 g/dL - - -  Albumin 3.5 - 5.0 g/dL 2.8(L) 2.7(L) 2.7(L)  AST 15 - 41 U/L - - -  ALT 0 - 44 U/L - - -  Alk Phosphatase 38 - 126 U/L - - -  Total Bilirubin 0.3 - 1.2 mg/dL - - -  Bilirubin, Direct 0.0 - 0.2 mg/dL - - -    CBC  Latest Ref Rng & Units 08/12/2018 08/11/2018 08/10/2018  WBC 4.0 - 10.5 K/uL 9.7 10.9(H) 13.1(H)  Hemoglobin 13.0 - 17.0 g/dL 11.2(L) 11.5(L) 10.6(L)  Hematocrit 39.0 - 52.0 % 35.9(L) 35.8(L) 32.6(L)  Platelets 150 - 400 K/uL 115(L) 150 118(L)    ABG    Component Value Date/Time   PHART 7.22 (L) 08/08/2018 0937   PCO2ART 32 08/08/2018 0937   PO2ART 81 (L) 08/08/2018 0937   HCO3 13.1 (L) 08/08/2018 0937   TCO2 24 02/08/2018 0035   ACIDBASEDEF 13.4 (H) 08/08/2018 0937   O2SAT 53.6 08/09/2018 0639    CXR: No new film   IMPRESSION: 1) chronic hypoxemic respiratory failure.  Appears to be at baseline 2) ischemic heart disease 3) elevated troponin I, deemed demand ischemia.  No further evaluation warranted at this time 4) hypotension.  Cardiogenic +/- medications (dexmedetomidine) 5) ESRD on CRRT 6) elevated PCT.  No clear source of sepsis noted 7) acute encephalopathy with intermittent severe agitation and combativeness.  Would note that cefepime can cause acute encephalopathy and might be a contributing factor  PLAN/REC: 1) continue supplemental oxygen as needed to maintain SPO2 >92% 2) continue norepinephrine to maintain MEP goal >60 mmHg 3) continue CRRT per nephrology 4) discontinue cefepime 5) continue dexmedetomidine infusion as needed 6) continue haloperidol as needed (initiated 6/20)  We should begin to address goals of care with patient's family.  Merton Border, MD PCCM service Mobile [phone] Pager [phone] 08/12/2018 3:03 PM

## 2018-08-12 NOTE — Progress Notes (Addendum)
[facility] [facility] [place] NAME: [name]    MR#:  [id number]  DATE OF BIRTH:  10-08-1955  SUBJECTIVE:   Patient remains critically ill on CRRT, hypotensive requiring vasopressor support, case discussed with intensivist as well as nephrology, patient remains encephalopathic  REVIEW OF SYSTEMS:    Review of Systems  Unable to perform ROS: Mental acuity    Nutrition: NPO Tolerating Diet: NO Tolerating PT: await Eval.   DRUG ALLERGIES:   Allergies  Allergen Reactions  . Sulfa Antibiotics Shortness Of Breath, Itching and Rash    VITALS:  Blood pressure 93/67, pulse (!) 41, temperature (!) 96.4 F (35.8 C), temperature source Rectal, resp. rate (!) 24, height 5\' 8"  (1.727 m), weight 81.2 kg, SpO2 99 %.  PHYSICAL EXAMINATION:   Physical Exam  GENERAL:  63 y.o.-year-old patient lying in bed critically ill on vasopressors and CRRT.   EYES: Pupils equal, round, reactive to light. No scleral icterus.  HEENT: Head atraumatic, normocephalic. NECK:  Supple, no jugular venous distention. No thyroid enlargement, no tenderness.  LUNGS: Poor Resp. Effort,  no wheezing, rales, rhonchi. No use of accessory muscles of respiration.  CARDIOVASCULAR: S1, S2 normal. No murmurs, rubs, or gallops.  ABDOMEN: Soft, nontender, nondistended. Bowel sounds present. No organomegaly or mass.  EXTREMITIES: No cyanosis, clubbing, +1-2 edema b/l    NEUROLOGIC: Cranial nerves II through XII are intact. No focal Motor or sensory deficits b/l.  Globally weak.  PSYCHIATRIC: The patient is alert and oriented x 1.  SKIN: No obvious rash, lesion, or ulcer.    LABORATORY PANEL:   CBC Recent Labs  Lab 08/12/18 0500  WBC 9.7  HGB 11.2*  HCT 35.9*  PLT 115*   ------------------------------------------------------------------------------------------------------------------  Chemistries  Recent Labs  Lab 08/07/18 1513  08/12/18 0920  NA  --    < > 136  K   --    < > 4.1  CL  --    < > 102  CO2  --    < > 22  GLUCOSE  --    < > 125*  BUN  --    < > 29*  CREATININE  --    < > 1.95*  CALCIUM  --    < > 9.4  MG  --    < > 2.1  AST 51*  --   --   ALT 27  --   --   ALKPHOS 87  --   --   BILITOT 1.0  --   --    < > = values in this interval not displayed.   ------------------------------------------------------------------------------------------------------------------  Cardiac Enzymes Recent Labs  Lab 08/08/18 0957  TROPONINI 1.73*   ------------------------------------------------------------------------------------------------------------------  RADIOLOGY:  Dg Abd 1 View  Result Date: 08/11/2018 CLINICAL DATA:  Abdominal distention. EXAM: ABDOMEN - 1 VIEW COMPARISON:  08/09/2018. FINDINGS: Surgical coils right upper quadrant. Oral contrast in the colon. No bowel distention. Aortoiliac atherosclerotic and peripheral vascular calcification. Pelvic calcifications consistent phleboliths. Degenerative changes thoracolumbar spine and both hips. No acute bony abnormality identified. Dual-lumen right IJ catheter noted with tip at cavoatrial junction. Left subclavian vascular stent noted. Cardiomegaly. No pulmonary venous congestion. IMPRESSION: 1. Surgical coils right upper quadrant again noted. No acute intra-abdominal abnormality identified. No bowel distention or free air. 2. Dual-lumen right IJ catheter with tip at cavoatrial junction. Stable cardiomegaly. 3.  Aortoiliac and peripheral vascular disease. Electronically Signed   By: Marcello Moores  Register  On: 08/11/2018 06:00     ASSESSMENT AND PLAN:  63 year old male with past medical history of end-stage renal disease on hemodialysis, pulmonary hypertension, ischemic cardiomyopathy, chronic combined systolic diastolic CHF, substance abuse.    *Acute probable cardiogenic shock  Likely due to acute on chronic cocaine abuse, cannot rule out questionable septic versus hypovolemic shock. Continue  Levophed with weaning as tolerated, empiric cefepime, follow-up on cultures  * Atrial fibrillation/flutter-new onset  Cardiology input greatly appreciated, unable to give beta-blocker or calcium channel blockers. Continue to hold off on long-term anticoagulation given previous history of GI bleed.  * End-stage renal disease on hemodialysis Hemodynamically unstable and therefore remains on CRRT Nephrology input greatly appreciated  *Secondary hyperparathyroidism Continue PhosLo, Sensipar  * Hyperlipidemia continue atorvastatin.  * Previous history of GI bleed Stable-no active bleeding  Condition guarded DVT prophylaxis with heparin subcu GI prophylaxis with Protonix Disposition pending clinical course Patient's prognosis is quite poor given his multiple comorbidities-palliative care consulted  All the records are reviewed and case discussed with Care Management/Social Worker. Management plans discussed with the patient, family and they are in agreement.  CODE STATUS: Full code  DVT Prophylaxis: Teds and SCDs  TOTAL TIME TAKING CARE OF THIS PATIENT: 35 minutes.   POSSIBLE D/C unclear DAYS, DEPENDING ON CLINICAL CONDITION and progress.   Avel Peace Jacquetta Polhamus M.D on 08/12/2018 at 12:23 PM  Between 7am to 6pm - Pager - [phone]  After 6pm go to [email or web address] - [employer] [facility]  Office  [phone]  CC: Primary care physician; Donnie Coffin, MD

## 2018-08-12 NOTE — Progress Notes (Signed)
Pt has remained alert with disorientation x 4. Pt has incomprehensible speech and exhibits intermittent severe agitation/anger with combativeness -> 1:1 safety sitter ordered- Precedex & Haldol PRN.  Pt has remained on 2LNC. Lung sounds are diminished to auscultation. NDN. SpO2 > 90%.  Pt has remained in A Flutter/NSR on cardiac monitor. HR WNL. BP still requires a little support-> Levo currently 2 mcgs.  Pt remains on CRRT. Filter changed today. IJ HD line dsg reinforced. No complications to note.

## 2018-08-12 NOTE — Progress Notes (Signed)
[facility]  ROUNDING NOTE   Subjective:   CRRT 4K bath. UF 47mL/hr Total UF of 558mL  Tmin 95.7  Norepinephrine 93mcg  BIPAP overnight  Confused and not cooperative   Objective:  Vital signs in last 24 hours:  Temp:  [95.7 F (35.4 C)-96.8 F (36 C)] 96.4 F (35.8 C) (06/20 0800) Pulse Rate:  [57-88] 87 (06/20 0800) Resp:  [4-28] 12 (06/20 0900) BP: (78-113)/(46-76) 93/67 (06/20 0825) SpO2:  [79 %-100 %] 99 % (06/20 0734) Weight:  [81.2 kg] 81.2 kg (06/20 0451)  Weight change: 0.5 kg Filed Weights   08/10/18 0424 08/11/18 0434 08/12/18 0451  Weight: 79.5 kg 80.7 kg 81.2 kg    Intake/Output: I/O last 3 completed shifts: In: 984.5 [I.V.:784.5; IV Piggyback:200] Out: 161 [Other:824]   Intake/Output this shift:  Total I/O In: -  Out: 48 [Other:48]  Physical Exam: General: Critically ill  Head: Normocephalic, atraumatic. Dry oral mucosal membranes  Eyes: Anicteric  Neck: Supple, trachea midline  Lungs:  Clear to auscultation, normal effort  Heart:  irregular  Abdomen:  Soft, nontender, bowel sounds present  Extremities: + peripheral edema.  Neurologic: Confused  Skin: No lesions  Access: LUE AVF +bruit and thrill, RIJ temp HD catheter 6/16 Dr. Mortimer Fries    Basic Metabolic Panel: Recent Labs  Lab 08/10/18 1608 08/10/18 1921 08/10/18 2347 08/11/18 0429 08/11/18 0817 08/11/18 1937 08/12/18 0920  NA 133* 132* 132* 136 137 134* 136  K 3.7 3.6 3.8 3.9 3.8 4.0 4.1  CL 99 96* 98 103 104 99 102  CO2 23 24 23  21* 23 24 22   GLUCOSE 183* 181* 183* 181* 170* 140* 125*  BUN 31* 32* 31* 32* 32* 31* 29*  CREATININE 2.75* 2.62* 2.46* 2.40* 2.25* 2.17* 1.95*  CALCIUM 8.9 8.7* 8.7* 9.1 9.1 9.0 9.4  MG 2.1 2.1 2.1 2.1  --  2.1  --   PHOS 5.3* 5.3* 5.0* 4.7* 4.5 4.0 3.6    Liver Function Tests: Recent Labs  Lab 08/07/18 1513  08/10/18 2347 08/11/18 0429 08/11/18 0817 08/11/18 1937 08/12/18 0920  AST 51*  --   --   --   --   --   --   ALT 27  --    --   --   --   --   --   ALKPHOS 87  --   --   --   --   --   --   BILITOT 1.0  --   --   --   --   --   --   PROT 7.9  --   --   --   --   --   --   ALBUMIN 3.4*   < > 2.7* 2.7* 2.7* 2.7* 2.8*   < > = values in this interval not displayed.   Recent Labs  Lab 08/07/18 1513  LIPASE 45   No results for input(s): AMMONIA in the last 168 hours.  CBC: Recent Labs  Lab 08/08/18 0957 08/09/18 0323 08/10/18 0431 08/11/18 0429 08/12/18 0500  WBC 9.9 12.7* 13.1* 10.9* 9.7  NEUTROABS 7.8*  --  12.2* 10.2* 8.7*  HGB 11.5* 10.5* 10.6* 11.5* 11.2*  HCT 37.6* 34.0* 32.6* 35.8* 35.9*  MCV 89.7 88.8 85.1 86.3 87.8  PLT 108* 95* 118* 150 115*    Cardiac Enzymes: Recent Labs  Lab 08/06/18 0045 08/06/18 0303 08/07/18 1855 08/08/18 0115 08/08/18 0957  TROPONINI 0.18* 0.22* 2.05* 1.92* 1.73*    BNP:  Invalid input(s): POCBNP  CBG: Recent Labs  Lab 08/11/18 1539 08/11/18 1945 08/12/18 0016 08/12/18 0413 08/12/18 0743  GLUCAP 119* 128* 99 106* 95    Microbiology: Results for orders placed or performed during the hospital encounter of 08/04/2018  Blood culture (routine x 2)     Status: None   Collection Time: 08/04/2018  7:46 AM   Specimen: BLOOD  Result Value Ref Range Status   Specimen Description BLOOD RFA  Final   Special Requests   Final    BOTTLES DRAWN AEROBIC AND ANAEROBIC Blood Culture results may not be optimal due to an excessive volume of blood received in culture bottles   Culture   Final    NO GROWTH 5 DAYS Performed at [facility], [address] [place], [place] [address]    Report Status 08/10/2018 FINAL  Final  Blood culture (routine x 2)     Status: None   Collection Time: 07/24/2018  7:47 AM   Specimen: BLOOD  Result Value Ref Range Status   Specimen Description BLOOD R AC  Final   Special Requests   Final    BOTTLES DRAWN AEROBIC AND ANAEROBIC Blood Culture results may not be optimal due to an excessive volume of blood received in culture  bottles   Culture   Final    NO GROWTH 5 DAYS Performed at [facility], [address], [place], [place] [address]    Report Status 08/10/2018 FINAL  Final  SARS Coronavirus 2 (CEPHEID- Performed in [place] hospital lab), Hosp Order     Status: None   Collection Time: 08/15/2018  8:34 AM   Specimen: Nasopharyngeal Swab  Result Value Ref Range Status   SARS Coronavirus 2 NEGATIVE NEGATIVE Final    Comment: (NOTE) If result is NEGATIVE SARS-CoV-2 target nucleic acids are NOT DETECTED. The SARS-CoV-2 RNA is generally detectable in upper and lower  respiratory specimens during the acute phase of infection. The lowest  concentration of SARS-CoV-2 viral copies this assay can detect is 250  copies / mL. A negative result does not preclude SARS-CoV-2 infection  and should not be used as the sole basis for treatment or other  patient management decisions.  A negative result may occur with  improper specimen collection / handling, submission of specimen other  than nasopharyngeal swab, presence of viral mutation(s) within the  areas targeted by this assay, and inadequate number of viral copies  (<250 copies / mL). A negative result must be combined with clinical  observations, patient history, and epidemiological information. If result is POSITIVE SARS-CoV-2 target nucleic acids are DETECTED. The SARS-CoV-2 RNA is generally detectable in upper and lower  respiratory specimens dur ing the acute phase of infection.  Positive  results are indicative of active infection with SARS-CoV-2.  Clinical  correlation with patient history and other diagnostic information is  necessary to determine patient infection status.  Positive results do  not rule out bacterial infection or co-infection with other viruses. If result is PRESUMPTIVE POSTIVE SARS-CoV-2 nucleic acids MAY BE PRESENT.   A presumptive positive result was obtained on the submitted specimen  and confirmed on repeat testing.   While 2019 novel coronavirus  (SARS-CoV-2) nucleic acids may be present in the submitted sample  additional confirmatory testing may be necessary for epidemiological  and / or clinical management purposes  to differentiate between  SARS-CoV-2 and other Sarbecovirus currently known to infect humans.  If clinically indicated additional testing with an alternate test  methodology [phone]) is advised. The SARS-CoV-2 RNA is generally  detectable in upper and lower respiratory sp ecimens during the acute  phase of infection. The expected result is Negative. Fact Sheet for Patients:  StrictlyIdeas.no Fact Sheet for Healthcare Providers: BankingDealers.co.za This test is not yet approved or cleared by the [place] FDA and has been authorized for detection and/or diagnosis of SARS-CoV-2 by FDA under an Emergency Use Authorization (EUA).  This EUA will remain in effect (meaning this test can be used) for the duration of the COVID-19 declaration under Section 564(b)(1) of the Act, 21 U.S.C. section 360bbb-3(b)(1), unless the authorization is terminated or revoked sooner. Performed at [facility], [address] [place], [place] [address]   MRSA PCR Screening     Status: None   Collection Time: 08/03/2018  1:50 PM   Specimen: Nasal Mucosa; Nasopharyngeal  Result Value Ref Range Status   MRSA by PCR NEGATIVE NEGATIVE Final    Comment:        The GeneXpert MRSA Assay (FDA approved for NASAL specimens only), is one component of a comprehensive MRSA colonization surveillance program. It is not intended to diagnose MRSA infection nor to guide or monitor treatment for MRSA infections. Performed at [facility], [address] [place], [place] [address]   CULTURE, BLOOD (ROUTINE X 2) w Reflex to ID Panel     Status: None (Preliminary result)   Collection Time: 08/09/18  3:24 AM   Specimen: BLOOD  Result Value Ref Range  Status   Specimen Description BLOOD BLOOD RIGHT ARM  Final   Special Requests   Final    BOTTLES DRAWN AEROBIC AND ANAEROBIC Blood Culture adequate volume   Culture   Final    NO GROWTH 3 DAYS Performed at [facility], [address], [place], [place] [address]    Report Status PENDING  Incomplete  CULTURE, BLOOD (ROUTINE X 2) w Reflex to ID Panel     Status: None (Preliminary result)   Collection Time: 08/09/18  3:41 AM   Specimen: BLOOD  Result Value Ref Range Status   Specimen Description BLOOD RIGHT ANTECUBITAL  Final   Special Requests   Final    BOTTLES DRAWN AEROBIC ONLY Blood Culture results may not be optimal due to an inadequate volume of blood received in culture bottles   Culture   Final    NO GROWTH 3 DAYS Performed at [facility], [address] [place], [place] [address]    Report Status PENDING  Incomplete    Coagulation Studies: No results for input(s): LABPROT, INR in the last 72 hours.  Urinalysis: No results for input(s): COLORURINE, LABSPEC, PHURINE, GLUCOSEU, HGBUR, BILIRUBINUR, KETONESUR, PROTEINUR, UROBILINOGEN, NITRITE, LEUKOCYTESUR in the last 72 hours.  Invalid input(s): APPERANCEUR    Imaging: Dg Abd 1 View  Result Date: 08/11/2018 CLINICAL DATA:  Abdominal distention. EXAM: ABDOMEN - 1 VIEW COMPARISON:  08/09/2018. FINDINGS: Surgical coils right upper quadrant. Oral contrast in the colon. No bowel distention. Aortoiliac atherosclerotic and peripheral vascular calcification. Pelvic calcifications consistent phleboliths. Degenerative changes thoracolumbar spine and both hips. No acute bony abnormality identified. Dual-lumen right IJ catheter noted with tip at cavoatrial junction. Left subclavian vascular stent noted. Cardiomegaly. No pulmonary venous congestion. IMPRESSION: 1. Surgical coils right upper quadrant again noted. No acute intra-abdominal abnormality identified. No bowel distention or free air. 2. Dual-lumen right IJ  catheter with tip at cavoatrial junction. Stable cardiomegaly. 3.  Aortoiliac and peripheral vascular disease. Electronically Signed  By: [place]   On: 08/11/2018 06:00     Medications:   . ceFEPime (MAXIPIME) IV 2 g (08/11/18 2148)  . dexmedetomidine (PRECEDEX) IV infusion 1.2 mcg/kg/hr (08/12/18 0904)  . norepinephrine (LEVOPHED) 16mg  / 253mL infusion 1 mcg/min (08/12/18 0805)  . pureflow 3 each (08/12/18 0300)   . haloperidol lactate      . aspirin EC  81 mg Oral Daily  . chlorhexidine  15 mL Mouth Rinse BID  . Chlorhexidine Gluconate Cloth  6 each Topical Daily  . docusate sodium  100 mg Oral Daily  . hydrocortisone sod succinate (SOLU-CORTEF) inj  50 mg Intravenous Q12H  . insulin aspart  0-15 Units Subcutaneous Q4H  . mouth rinse  15 mL Mouth Rinse q12n4p  . midodrine  10 mg Oral TID WC  . pantoprazole (PROTONIX) IV  40 mg Intravenous Q24H  . sodium chloride flush  10-40 mL Intracatheter Q12H   acetaminophen **OR** acetaminophen, diphenhydrAMINE, heparin, hydrOXYzine, ipratropium-albuterol, morphine injection, nitroGLYCERIN, [DISCONTINUED] ondansetron **OR** ondansetron (ZOFRAN) IV, oxyCODONE-acetaminophen, senna-docusate  Assessment/ Plan:  [name] is a 63 y.o. black male with end stage renal disease on hemodialysis, hypertension, hepatitis C, anemia, seizure disorder, COPD/tobacco abuse,substance abuse (cocaine) and alcohol abuse,secondary hyperparathyroidism, chronic systolic congestive heart failure admitted with chest pain and atrial flutter. Complicated by bradycardia. Transferred to ICU on 6/15 for worsening status, hypoglycemia, hypotension, sepsis and hyperkalemia.  Placed on CRRT on 6/16. Required up to three vasopressors   [facility] M[facility] [address]Left arm AVG. 240 minutes 72kg  1.  ESRD on HD MWF.  Placed on CRRT  Requiring vasopressors: currently on norepinephrine  Not hemodynamically stable for intermittent hemodialysis treatment.  -  Continue CVVHD 4K bath  2.  Anemia of chronic kidney disease.  Hemoglobin 11.2 Holding EPO due to concerns of dissection/ischemia.   3.  Secondary hyperparathyroidism with hyperphophatemia. PTH elevated at 1178 on 07/31/2018  4.  Shock with hypotension: cardiogenic with Chest pain/atrial flutter/bradycardia.   Outpatient baseline blood pressures are 90s/60s.  With demand ischemia. However also with elevated lactic acid level and procalcitonin levels. Concern for underlying sepsis.  - Empiric cefepime - Weaing off vasopressors.   5. Diabetes mellitus type II with chronic kidney disease: with prolonged hypoglycemia   LOS: 7 Ignacia Gentzler 6/20/20209:57 AM

## 2018-08-13 DIAGNOSIS — R410 Disorientation, unspecified: Secondary | ICD-10-CM

## 2018-08-13 DIAGNOSIS — R451 Restlessness and agitation: Secondary | ICD-10-CM

## 2018-08-13 DIAGNOSIS — G9340 Encephalopathy, unspecified: Secondary | ICD-10-CM

## 2018-08-13 LAB — RENAL FUNCTION PANEL
Albumin: 2.7 g/dL — ABNORMAL LOW (ref 3.5–5.0)
Anion gap: 10 (ref 5–15)
BUN: 26 mg/dL — ABNORMAL HIGH (ref 8–23)
CO2: 23 mmol/L (ref 22–32)
Calcium: 9.2 mg/dL (ref 8.9–10.3)
Chloride: 102 mmol/L (ref 98–111)
Creatinine, Ser: 1.49 mg/dL — ABNORMAL HIGH (ref 0.61–1.24)
GFR calc Af Amer: 57 mL/min — ABNORMAL LOW (ref >60)
GFR calc non Af Amer: 49 mL/min — ABNORMAL LOW (ref >60)
Glucose, Bld: 149 mg/dL — ABNORMAL HIGH (ref 70–99)
Phosphorus: 3.1 mg/dL (ref 2.5–4.6)
Potassium: 3.9 mmol/L (ref 3.5–5.1)
Sodium: 135 mmol/L (ref 135–145)

## 2018-08-13 LAB — CBC WITH DIFFERENTIAL/PLATELET
Abs Immature Granulocytes: 0.06 10*3/uL (ref 0.00–0.07)
Basophils Absolute: 0 10*3/uL (ref 0.0–0.1)
Basophils Relative: 0 %
Eosinophils Absolute: 0 10*3/uL (ref 0.0–0.5)
Eosinophils Relative: 0 %
HCT: 38.2 % — ABNORMAL LOW (ref 39.0–52.0)
Hemoglobin: 12.1 g/dL — ABNORMAL LOW (ref 13.0–17.0)
Immature Granulocytes: 1 %
Lymphocytes Relative: 3 %
Lymphs Abs: 0.2 10*3/uL — ABNORMAL LOW (ref 0.7–4.0)
MCH: 27.5 pg (ref 26.0–34.0)
MCHC: 31.7 g/dL (ref 30.0–36.0)
MCV: 86.8 fL (ref 80.0–100.0)
Monocytes Absolute: 0.7 10*3/uL (ref 0.1–1.0)
Monocytes Relative: 8 %
Neutro Abs: 7.6 10*3/uL (ref 1.7–7.7)
Neutrophils Relative %: 88 %
Platelets: 125 10*3/uL — ABNORMAL LOW (ref 150–400)
RBC: 4.4 MIL/uL (ref 4.22–5.81)
RDW: 19.8 % — ABNORMAL HIGH (ref 11.5–15.5)
WBC: 8.6 10*3/uL (ref 4.0–10.5)
nRBC: 3.4 % — ABNORMAL HIGH (ref 0.0–0.2)

## 2018-08-13 LAB — MAGNESIUM: Magnesium: 2 mg/dL (ref 1.7–2.4)

## 2018-08-13 LAB — GLUCOSE, CAPILLARY
Glucose-Capillary: 142 mg/dL — ABNORMAL HIGH (ref 70–99)
Glucose-Capillary: 48 mg/dL — ABNORMAL LOW (ref 70–99)
Glucose-Capillary: 73 mg/dL (ref 70–99)
Glucose-Capillary: 76 mg/dL (ref 70–99)
Glucose-Capillary: 82 mg/dL (ref 70–99)
Glucose-Capillary: 93 mg/dL (ref 70–99)

## 2018-08-13 LAB — APTT: aPTT: 35 seconds (ref 24–36)

## 2018-08-13 LAB — PROCALCITONIN: Procalcitonin: 0.93 ng/mL

## 2018-08-13 MED ORDER — DEXTROSE 50 % IV SOLN
INTRAVENOUS | Status: AC
  Administered 2018-08-13: 25 mL via INTRAVENOUS
  Filled 2018-08-13: qty 50

## 2018-08-13 MED ORDER — ATROPINE SULFATE 1 MG/10ML IJ SOSY
PREFILLED_SYRINGE | INTRAMUSCULAR | Status: AC
  Administered 2018-08-13: 20:00:00
  Filled 2018-08-13: qty 10

## 2018-08-13 MED ORDER — DOPAMINE-DEXTROSE 3.2-5 MG/ML-% IV SOLN
INTRAVENOUS | Status: AC
  Filled 2018-08-13: qty 250

## 2018-08-13 MED ORDER — DEXTROSE 10 % IV SOLN
INTRAVENOUS | Status: DC
  Administered 2018-08-13: 13:00:00 via INTRAVENOUS

## 2018-08-13 MED ORDER — VALPROATE SODIUM 500 MG/5ML IV SOLN
500.0000 mg | Freq: Two times a day (BID) | INTRAVENOUS | Status: DC
  Administered 2018-08-13: 500 mg via INTRAVENOUS
  Filled 2018-08-13 (×3): qty 5

## 2018-08-13 MED ORDER — LORAZEPAM 2 MG/ML IJ SOLN
0.5000 mg | INTRAMUSCULAR | Status: DC | PRN
  Administered 2018-08-13: 0.5 mg via INTRAVENOUS

## 2018-08-13 MED ORDER — HYDROCORTISONE NA SUCCINATE PF 100 MG IJ SOLR
25.0000 mg | Freq: Two times a day (BID) | INTRAMUSCULAR | Status: DC
  Administered 2018-08-13: 25 mg via INTRAVENOUS
  Filled 2018-08-13: qty 2

## 2018-08-13 MED ORDER — LORAZEPAM 2 MG/ML IJ SOLN
INTRAMUSCULAR | Status: AC
  Administered 2018-08-13: 0.5 mg via INTRAVENOUS
  Filled 2018-08-13: qty 1

## 2018-08-14 LAB — CULTURE, BLOOD (ROUTINE X 2)
Culture: NO GROWTH
Culture: NO GROWTH
Special Requests: ADEQUATE

## 2018-08-14 LAB — COCAINE,MS,WB/SP RFX
Benzoylecgonine: 229 ng/mL
Cocaine Confirmation: POSITIVE
Cocaine: NEGATIVE ng/mL

## 2018-08-14 LAB — GLUCOSE, CAPILLARY: Glucose-Capillary: 80 mg/dL (ref 70–99)

## 2018-08-14 NOTE — Progress Notes (Signed)
Patient left floor to the morgue with Hydrographic surveyor. Bed control to be notified now.

## 2018-08-14 NOTE — Progress Notes (Signed)
Family left bedside. Patient ready to be taken to the morgue. Administrative coordinator called to inform that patient is ready for transfer.

## 2018-08-15 LAB — DRUG SCREEN 10 W/CONF, SERUM
Amphetamines, IA: NEGATIVE ng/mL
Barbiturates, IA: NEGATIVE ug/mL
Benzodiazepines, IA: NEGATIVE ng/mL
Cocaine & Metabolite, IA: POSITIVE ng/mL — AB
Methadone, IA: NEGATIVE ng/mL
Opiates, IA: POSITIVE ng/mL — AB
Oxycodones, IA: POSITIVE ng/mL — AB
Phencyclidine, IA: NEGATIVE ng/mL
Propoxyphene, IA: NEGATIVE ng/mL
THC(Marijuana) Metabolite, IA: NEGATIVE ng/mL

## 2018-08-15 LAB — OPIATES,MS,WB/SP RFX
6-Acetylmorphine: NEGATIVE
Codeine: NEGATIVE ng/mL
Dihydrocodeine: NEGATIVE ng/mL
Hydrocodone: NEGATIVE ng/mL
Hydromorphone: NEGATIVE ng/mL
Morphine: 5.6 ng/mL
Opiate Confirmation: POSITIVE

## 2018-08-15 LAB — OXYCODONES,MS,WB/SP RFX
Oxycocone: 13.5 ng/mL
Oxycodones Confirmation: POSITIVE
Oxymorphone: NEGATIVE ng/mL

## 2018-08-16 ENCOUNTER — Telehealth: Payer: Self-pay | Admitting: Pulmonary Disease

## 2018-08-16 NOTE — Telephone Encounter (Signed)
Death certificate received. Placed in DS box for completion.

## 2018-08-21 NOTE — Telephone Encounter (Signed)
Death certificate has been placed up front for pick up.  [facility] is aware.

## 2018-08-23 NOTE — Progress Notes (Signed)
PULMONARY/CCM PROGRESS NOTE  PT PROFILE: 63 y.o. M with ESRD, CAD, PVD, DM II, chronic cocaine abuse, Hep C, chronic AF/flutter, prior massive GI bleeds adm 06/13 via ED with chest pain, cough, shortness of breath, hypotension. Trop I elevated (peak 1.92). PCT mildly elevated - peak 2.78. Concern for possible sepsis but no clear source  MAJOR EVENTS/TEST RESULTS: 06/14 Echo: LVEF 55%.  Mildly increased LV wall thickness.  LA moderately dilated.  RV moderately dilated.  RVSP estimate 44 mmHg. 06/15 CT chest: Bilateral R >L pleural effusions with compressive atelectasis  06/15 CT head: No acute intracranial findings 06/16 CRRT initiated 06/16-06/20 Persistent agitated delirium.  Continued CRRT.  Continued need for low-dose norepinephrine 06/21 Severe delirium persists despite dexmedetomidine infusion and intermittent haloperidol.  Patient admits (in his delirious state) to drinking "a lot" of beer  INDWELLING DEVICES:: R IJ HD cath 06/16 >>  06/17 R femoral CVL 06/17 >>   MICRO DATA: All micro data has been reviewed.  No significant positive results  ANTIMICROBIALS:  Anti-infectives (From admission, onward)   Start     Dose/Rate Route Frequency Ordered Stop   08/09/18 0515  metroNIDAZOLE (FLAGYL) IVPB 500 mg  Status:  Discontinued     500 mg 100 mL/hr over 60 Minutes Intravenous Every 8 hours 08/09/18 0506 08/10/18 1105   08/08/18 2200  ceFEPIme (MAXIPIME) 2 g in sodium chloride 0.9 % 100 mL IVPB  Status:  Discontinued     2 g 200 mL/hr over 30 Minutes Intravenous Every 12 hours 08/08/18 1335 08/12/18 1118   08/07/18 1200  ceFEPIme (MAXIPIME) 2 g in sodium chloride 0.9 % 100 mL IVPB  Status:  Discontinued     2 g 200 mL/hr over 30 Minutes Intravenous Every M-W-F (Hemodialysis) 08/08/2018 1250 08/08/18 1335   08/07/18 1200  vancomycin (VANCOCIN) IVPB 750 mg/150 ml premix  Status:  Discontinued     750 mg 150 mL/hr over 60 Minutes Intravenous Every M-W-F (Hemodialysis) 08/11/2018 1250  08/07/18 2155   08/16/2018 1415  vancomycin (VANCOCIN) IVPB 750 mg/150 ml premix     750 mg 150 mL/hr over 60 Minutes Intravenous  Once 07/29/2018 1250 08/14/2018 1600   07/31/2018 0730  ceFEPIme (MAXIPIME) 2 g in sodium chloride 0.9 % 100 mL IVPB     2 g 200 mL/hr over 30 Minutes Intravenous  Once 07/24/2018 0715 07/24/2018 0826   08/02/2018 0730  metroNIDAZOLE (FLAGYL) IVPB 500 mg     500 mg 100 mL/hr over 60 Minutes Intravenous  Once 08/20/2018 0715 08/12/2018 0908   07/29/2018 0730  vancomycin (VANCOCIN) IVPB 1000 mg/200 mL premix     1,000 mg 200 mL/hr over 60 Minutes Intravenous  Once 08/16/2018 0715 08/04/2018 0908       SUBJ: Severe delirium persists despite dexmedetomidine infusion and intermittent haloperidol.  Patient admits (in his delirious state) to drinking "a lot" of beer  OBJ: Vitals:   30-Aug-2018 0636 30-Aug-2018 0800 Aug 30, 2018 0900 08-30-18 1000  BP: (!) 105/93 (!) 127/58 (!) 127/58 112/89  Pulse:    68  Resp: 19 (!) 26 (!) 22 (!) 21  Temp:  97.8 F (36.6 C)    TempSrc:  Axillary    SpO2: 100% 96%  98%  Weight:      Height:      2 LPM Soldier  Gen: Agitated, crying out and moaning almost incessantly but will answer questions HEENT: NCAT, sclerae white Neck: No LAN, no JVD noted Lungs: Clear anteriorly Cardiovascular: Regular (sinus rhythm), intermittently tachycardic (when  agitated), no M Abdomen: Soft, NT, +BS Ext: Warm, no edema Neuro: No focal deficits noted Skin: No lesions noted   BMP Latest Ref Rng & Units 08/23/2018 08/12/2018 08/12/2018  Glucose 70 - 99 mg/dL 149(H) 161(H) 125(H)  BUN 8 - 23 mg/dL 26(H) 29(H) 29(H)  Creatinine 0.61 - 1.24 mg/dL 1.49(H) 1.65(H) 1.95(H)  Sodium 135 - 145 mmol/L 135 133(L) 136  Potassium 3.5 - 5.1 mmol/L 3.9 4.0 4.1  Chloride 98 - 111 mmol/L 102 99 102  CO2 22 - 32 mmol/L 23 17(L) 22  Calcium 8.9 - 10.3 mg/dL 9.2 9.3 9.4    Hepatic Function Latest Ref Rng & Units 08/23/18 08/12/2018 08/12/2018  Total Protein 6.5 - 8.1 g/dL - - -  Albumin  3.5 - 5.0 g/dL 2.7(L) 2.9(L) 2.8(L)  AST 15 - 41 U/L - - -  ALT 0 - 44 U/L - - -  Alk Phosphatase 38 - 126 U/L - - -  Total Bilirubin 0.3 - 1.2 mg/dL - - -  Bilirubin, Direct 0.0 - 0.2 mg/dL - - -    CBC Latest Ref Rng & Units 23-Aug-2018 08/12/2018 08/11/2018  WBC 4.0 - 10.5 K/uL 8.6 9.7 10.9(H)  Hemoglobin 13.0 - 17.0 g/dL 12.1(L) 11.2(L) 11.5(L)  Hematocrit 39.0 - 52.0 % 38.2(L) 35.9(L) 35.8(L)  Platelets 150 - 400 K/uL 125(L) 115(L) 150    ABG    Component Value Date/Time   PHART 7.22 (L) 08/08/2018 0937   PCO2ART 32 08/08/2018 0937   PO2ART 81 (L) 08/08/2018 0937   HCO3 13.1 (L) 08/08/2018 0937   TCO2 24 02/08/2018 0035   ACIDBASEDEF 13.4 (H) 08/08/2018 0937   O2SAT 53.6 08/09/2018 0639    CXR: No new film   IMPRESSION: 1) chronic hypoxemic respiratory failure.  Appears to be at baseline 2) ischemic heart disease without evidence of acute ischemia presently 3) elevated trop I, deemed demand ischemia.  No further evaluation planned 4) persistent hypotension.  Cardiogenic +/- medications (dexmedetomidine) 5) ESRD on CRRT 6) elevated PCT.  No clear source of sepsis noted.  Monitoring off abx 7) acute encephalopathy with intermittent severe agitation and combativeness. Possible EtOH withdrawal syndrome   PLAN/REC: 1) continue supplemental oxygen as needed to maintain SPO2 >92% 2) continue norepinephrine to maintain MEP goal >60 mmHg 3) continue CRRT per nephrology 4) continue dexmedetomidine infusion as needed - upper limit increased 5) Valproic acidfor delirium initiated 06/21 6) Lorazepam PRN initiated 06/21 6) continue haloperidol as needed (initiated 6/20)  I tried to contact family members without success  Merton Border, MD PCCM service Mobile [phone] Pager [phone] Aug 23, 2018 3:59 PM

## 2018-08-23 NOTE — Progress Notes (Addendum)
[facility] NAME: Clavin Ruhlman    MR#:  [id number]  DATE OF BIRTH:  1955/04/01  SUBJECTIVE:   Patient remains critically ill on CRRT, hypotensive requiring vasopressor support, case discussed with intensivist/nephrology, patient remains encephalopathic  REVIEW OF SYSTEMS:    Review of Systems  Unable to perform ROS: Mental acuity    Nutrition: NPO Tolerating Diet: NO Tolerating PT: await Eval.   DRUG ALLERGIES:   Allergies  Allergen Reactions  . Sulfa Antibiotics Shortness Of Breath, Itching and Rash    VITALS:  Blood pressure 112/89, pulse 68, temperature 97.8 F (36.6 C), temperature source Axillary, resp. rate (!) 21, height 5\' 8"  (1.727 m), weight 80.4 kg, SpO2 98 %.  PHYSICAL EXAMINATION:   Physical Exam  GENERAL:  63 y.o.-year-old patient lying in bed critically ill on vasopressors and CRRT.   EYES: Pupils equal, round, reactive to light. No scleral icterus.  HEENT: Head atraumatic, normocephalic. NECK:  Supple, no jugular venous distention. No thyroid enlargement, no tenderness.  LUNGS: Poor Resp. Effort,  no wheezing, rales, rhonchi. No use of accessory muscles of respiration.  CARDIOVASCULAR: S1, S2 normal. No murmurs, rubs, or gallops.  ABDOMEN: Soft, nontender, nondistended. Bowel sounds present. No organomegaly or mass.  EXTREMITIES: No cyanosis, clubbing, +1-2 edema b/l    NEUROLOGIC: Cranial nerves II through XII are intact. No focal Motor or sensory deficits b/l.  Globally weak.  PSYCHIATRIC: The patient is alert and oriented x 1.  SKIN: No obvious rash, lesion, or ulcer.    LABORATORY PANEL:   CBC Recent Labs  Lab 08/24/18 0420  WBC 8.6  HGB 12.1*  HCT 38.2*  PLT 125*   ------------------------------------------------------------------------------------------------------------------  Chemistries  Recent Labs  Lab 08/07/18 1513  08/24/18 0833  NA  --    < > 135  K  --    < > 3.9   CL  --    < > 102  CO2  --    < > 23  GLUCOSE  --    < > 149*  BUN  --    < > 26*  CREATININE  --    < > 1.49*  CALCIUM  --    < > 9.2  MG  --    < > 2.0  AST 51*  --   --   ALT 27  --   --   ALKPHOS 87  --   --   BILITOT 1.0  --   --    < > = values in this interval not displayed.   ------------------------------------------------------------------------------------------------------------------  Cardiac Enzymes Recent Labs  Lab 08/08/18 0957  TROPONINI 1.73*   ------------------------------------------------------------------------------------------------------------------  RADIOLOGY:  No results found.   ASSESSMENT AND PLAN:  63 year old male with past medical history of end-stage renal disease on hemodialysis, pulmonary hypertension, ischemic cardiomyopathy, chronic combined systolic diastolic CHF, substance abuse.    *Acute probable cardiogenic shock  Likely due to acute on chronic cocaine abuse, cannot rule out questionable septic versus hypovolemic shock. Continue Levophed with weaning as tolerated, empiric cefepime, follow-up on cultures  * Atrial fibrillation/flutter-new onset  Cardiology input greatly appreciated, unable to give beta-blocker or calcium channel blockers. Continue to hold off on long-term anticoagulation given previous history of GI bleed.  * End-stage renal disease on hemodialysis Hemodynamically unstable and therefore remains on CRRT Nephrology input greatly appreciated  *Secondary hyperparathyroidism Continue PhosLo, Sensipar  * Hyperlipidemia continue atorvastatin.  * Previous history of  GI bleed Stable-no active bleeding  Condition guarded DVT prophylaxis with heparin subcu GI prophylaxis with Protonix Disposition pending clinical course Patient's prognosis is quite poor given his multiple comorbidities-palliative care consulted  All the records are reviewed and case discussed with Care Management/Social Worker. Management plans  discussed with the patient, family and they are in agreement.  CODE STATUS: Full code  DVT Prophylaxis: Teds and SCDs  TOTAL TIME TAKING CARE OF THIS PATIENT: 35 minutes.   POSSIBLE D/C unclear DAYS, DEPENDING ON CLINICAL CONDITION and progress.   Avel Peace Chenell Lozon M.D on September 01, 2018 at 1:36 PM  Between 7am to 6pm - Pager - [phone]  After 6pm go to [email or web address] - [employer] [facility]  Office  [phone]  CC: Primary care physician; Donnie Coffin, MD

## 2018-08-23 NOTE — Progress Notes (Signed)
I was informed by Staff that pt has developed bradycardia around 8.02 pm. around  He has been on precedex drip which was decreased and then stopped. He remained bardycradic with relatively OK BP. Atropine was given however he became asystolic and CPR was carried out. He developed v fib  and full scope of ACLS was carried out.He was intubated during the CPR. Pl see ACLS sheet for full details. After prolonged CPR of about 23 minutes code was called off. Time of death was 2054/06/02. Family was called and the events were explained to them in person.

## 2018-08-23 NOTE — Progress Notes (Signed)
Pharmacy CRRT Medication Monitoring and Adjustment Consult:  Pharmacy consulted to assist in monitoring and replacing electrolytes in this 63 y.o. male admitted on 07/29/2018 with hypotension and atrial flutter. Patient initiated on CRRT on 6/16.   Labs:  Sodium (mmol/[name])  Date Value  08/12/2018 133 ([name])  04/30/2013 139   Potassium (mmol/[name])  Date Value  08/12/2018 4.0  04/30/2013 4.4   Magnesium (mg/dL)  Date Value  08/12/2018 2.1  11/05/2012 1.5 ([name])   Phosphorus (mg/dL)  Date Value  08/12/2018 3.6  04/30/2013 4.6   Calcium (mg/dL)  Date Value  08/12/2018 9.3   Calcium, Total (mg/dL)  Date Value  04/30/2013 8.0 ([name])   Albumin (g/dL)  Date Value  08/12/2018 2.9 ([name])  04/30/2013 2.6 ([name])   Corrected Calcium 9.5  Assessment: -Patient on CRRT 4K bath.  -Norepinephrine  -Hydrocortisone 25mg  IV BID.   Plan: No adjustments warranted at this time.  Pharmacy will continue to monitor and adjust per consult.   [name], RPh 2018/08/15 7:18 AM

## 2018-08-23 NOTE — Progress Notes (Signed)
[facility]  ROUNDING NOTE   Subjective:   CRRT 4K bath. UF 87mL/hr Total UF of 534mL  Norepinephrine 44mcg  Confused and not cooperative. Sitter at bedside.   Cefepime discontinued  Objective:  Vital signs in last 24 hours:  Temp:  [97.6 F (36.4 C)-97.9 F (36.6 C)] 97.8 F (36.6 C) (06/21 0800) Pulse Rate:  [68-97] 68 (06/21 1000) Resp:  [7-30] 21 (06/21 1000) BP: (65-192)/(54-172) 112/89 (06/21 1000) SpO2:  [85 %-100 %] 98 % (06/21 1000) FiO2 (%):  [28 %] 28 % (06/21 0800) Weight:  [80.4 kg] 80.4 kg (06/21 0424)  Weight change: -0.8 kg Filed Weights   08/11/18 0434 08/12/18 0451 2018-09-06 0424  Weight: 80.7 kg 81.2 kg 80.4 kg    Intake/Output: I/O last 3 completed shifts: In: 1251.2 [I.V.:1251.2] Out: 815 [Other:815]   Intake/Output this shift:  Total I/O In: -  Out: 96 [Other:96]  Physical Exam: General: Critically ill  Head: Normocephalic, atraumatic. Dry oral mucosal membranes  Eyes: Anicteric  Neck: Supple, trachea midline  Lungs:  Clear to auscultation, normal effort  Heart:  irregular  Abdomen:  Soft, nontender, bowel sounds present  Extremities: + peripheral edema.  Neurologic: Confused  Skin: No lesions  Access: LUE AVF +bruit and thrill, RIJ temp HD catheter 6/16 Dr. Mortimer Fries    Basic Metabolic Panel: Recent Labs  Lab 08/11/18 0429 08/11/18 0817 08/11/18 1937 08/12/18 0920 08/12/18 2000 09-06-18 0833  NA 136 137 134* 136 133* 135  K 3.9 3.8 4.0 4.1 4.0 3.9  CL 103 104 99 102 99 102  CO2 21* 23 24 22  17* 23  GLUCOSE 181* 170* 140* 125* 161* 149*  BUN 32* 32* 31* 29* 29* 26*  CREATININE 2.40* 2.25* 2.17* 1.95* 1.65* 1.49*  CALCIUM 9.1 9.1 9.0 9.4 9.3 9.2  MG 2.1  --  2.1 2.1 2.1 2.0  PHOS 4.7* 4.5 4.0 3.6 3.6 3.1    Liver Function Tests: Recent Labs  Lab 08/07/18 1513  08/11/18 0817 08/11/18 1937 08/12/18 0920 08/12/18 2000 06-Sep-2018 0833  AST 51*  --   --   --   --   --   --   ALT 27  --   --   --   --   --    --   ALKPHOS 87  --   --   --   --   --   --   BILITOT 1.0  --   --   --   --   --   --   PROT 7.9  --   --   --   --   --   --   ALBUMIN 3.4*   < > 2.7* 2.7* 2.8* 2.9* 2.7*   < > = values in this interval not displayed.   Recent Labs  Lab 08/07/18 1513  LIPASE 45   No results for input(s): AMMONIA in the last 168 hours.  CBC: Recent Labs  Lab 08/08/18 0957 08/09/18 0323 08/10/18 0431 08/11/18 0429 08/12/18 0500 09/06/18 0420  WBC 9.9 12.7* 13.1* 10.9* 9.7 8.6  NEUTROABS 7.8*  --  12.2* 10.2* 8.7* 7.6  HGB 11.5* 10.5* 10.6* 11.5* 11.2* 12.1*  HCT 37.6* 34.0* 32.6* 35.8* 35.9* 38.2*  MCV 89.7 88.8 85.1 86.3 87.8 86.8  PLT 108* 95* 118* 150 115* 125*    Cardiac Enzymes: Recent Labs  Lab 08/07/18 1855 08/08/18 0115 08/08/18 0957  TROPONINI 2.05* 1.92* 1.73*    BNP: Invalid input(s): POCBNP  CBG:  Recent Labs  Lab 08/12/18 1627 08/12/18 2018 August 17, 2018 0026 08-17-18 0340 08-17-2018 0741  GLUCAP 75 151* 142* 73 82    Microbiology: Results for orders placed or performed during the hospital encounter of 08/09/2018  Blood culture (routine x 2)     Status: None   Collection Time: 08/04/2018  7:46 AM   Specimen: BLOOD  Result Value Ref Range Status   Specimen Description BLOOD RFA  Final   Special Requests   Final    BOTTLES DRAWN AEROBIC AND ANAEROBIC Blood Culture results may not be optimal due to an excessive volume of blood received in culture bottles   Culture   Final    NO GROWTH 5 DAYS Performed at [facility], [address], [place], [place] [address]    Report Status 08/10/2018 FINAL  Final  Blood culture (routine x 2)     Status: None   Collection Time: 07/29/2018  7:47 AM   Specimen: BLOOD  Result Value Ref Range Status   Specimen Description BLOOD R AC  Final   Special Requests   Final    BOTTLES DRAWN AEROBIC AND ANAEROBIC Blood Culture results may not be optimal due to an excessive volume of blood received in culture bottles   Culture    Final    NO GROWTH 5 DAYS Performed at [facility], [address], [place], [place] [address]    Report Status 08/10/2018 FINAL  Final  SARS Coronavirus 2 (CEPHEID- Performed in [place] hospital lab), Hosp Order     Status: None   Collection Time: 08/15/2018  8:34 AM   Specimen: Nasopharyngeal Swab  Result Value Ref Range Status   SARS Coronavirus 2 NEGATIVE NEGATIVE Final    Comment: (NOTE) If result is NEGATIVE SARS-CoV-2 target nucleic acids are NOT DETECTED. The SARS-CoV-2 RNA is generally detectable in upper and lower  respiratory specimens during the acute phase of infection. The lowest  concentration of SARS-CoV-2 viral copies this assay can detect is 250  copies / mL. A negative result does not preclude SARS-CoV-2 infection  and should not be used as the sole basis for treatment or other  patient management decisions.  A negative result may occur with  improper specimen collection / handling, submission of specimen other  than nasopharyngeal swab, presence of viral mutation(s) within the  areas targeted by this assay, and inadequate number of viral copies  (<250 copies / mL). A negative result must be combined with clinical  observations, patient history, and epidemiological information. If result is POSITIVE SARS-CoV-2 target nucleic acids are DETECTED. The SARS-CoV-2 RNA is generally detectable in upper and lower  respiratory specimens dur ing the acute phase of infection.  Positive  results are indicative of active infection with SARS-CoV-2.  Clinical  correlation with patient history and other diagnostic information is  necessary to determine patient infection status.  Positive results do  not rule out bacterial infection or co-infection with other viruses. If result is PRESUMPTIVE POSTIVE SARS-CoV-2 nucleic acids MAY BE PRESENT.   A presumptive positive result was obtained on the submitted specimen  and confirmed on repeat testing.  While 2019 novel  coronavirus  (SARS-CoV-2) nucleic acids may be present in the submitted sample  additional confirmatory testing may be necessary for epidemiological  and / or clinical management purposes  to differentiate between  SARS-CoV-2 and other Sarbecovirus currently known to infect humans.  If clinically indicated additional testing with an alternate test  methodology [phone]) is advised. The  SARS-CoV-2 RNA is generally  detectable in upper and lower respiratory sp ecimens during the acute  phase of infection. The expected result is Negative. Fact Sheet for Patients:  StrictlyIdeas.no Fact Sheet for Healthcare Providers: BankingDealers.co.za This test is not yet approved or cleared by the [place] FDA and has been authorized for detection and/or diagnosis of SARS-CoV-2 by FDA under an Emergency Use Authorization (EUA).  This EUA will remain in effect (meaning this test can be used) for the duration of the COVID-19 declaration under Section 564(b)(1) of the Act, 21 U.S.C. section 360bbb-3(b)(1), unless the authorization is terminated or revoked sooner. Performed at [facility], [address] [place], [place] [address]   MRSA PCR Screening     Status: None   Collection Time: 08/08/2018  1:50 PM   Specimen: Nasal Mucosa; Nasopharyngeal  Result Value Ref Range Status   MRSA by PCR NEGATIVE NEGATIVE Final    Comment:        The GeneXpert MRSA Assay (FDA approved for NASAL specimens only), is one component of a comprehensive MRSA colonization surveillance program. It is not intended to diagnose MRSA infection nor to guide or monitor treatment for MRSA infections. Performed at [facility], [address] [place], [place] [address]   CULTURE, BLOOD (ROUTINE X 2) w Reflex to ID Panel     Status: None (Preliminary result)   Collection Time: 08/09/18  3:24 AM   Specimen: BLOOD  Result Value Ref Range Status    Specimen Description BLOOD BLOOD RIGHT ARM  Final   Special Requests   Final    BOTTLES DRAWN AEROBIC AND ANAEROBIC Blood Culture adequate volume   Culture   Final    NO GROWTH 4 DAYS Performed at [facility], [address] [place], [place] [address]    Report Status PENDING  Incomplete  CULTURE, BLOOD (ROUTINE X 2) w Reflex to ID Panel     Status: None (Preliminary result)   Collection Time: 08/09/18  3:41 AM   Specimen: BLOOD  Result Value Ref Range Status   Specimen Description BLOOD RIGHT ANTECUBITAL  Final   Special Requests   Final    BOTTLES DRAWN AEROBIC ONLY Blood Culture results may not be optimal due to an inadequate volume of blood received in culture bottles   Culture   Final    NO GROWTH 4 DAYS Performed at [facility], [address] [place], [place] [address]    Report Status PENDING  Incomplete    Coagulation Studies: No results for input(s): LABPROT, INR in the last 72 hours.  Urinalysis: No results for input(s): COLORURINE, LABSPEC, PHURINE, GLUCOSEU, HGBUR, BILIRUBINUR, KETONESUR, PROTEINUR, UROBILINOGEN, NITRITE, LEUKOCYTESUR in the last 72 hours.  Invalid input(s): APPERANCEUR    Imaging: No results found.   Medications:   . dexmedetomidine (PRECEDEX) IV infusion 1.2 mcg/kg/hr (September 11, 2018 0700)  . dextrose 40 mL/hr at 09/11/2018 0700  . norepinephrine (LEVOPHED) 16mg  / 213mL infusion 2 mcg/min (2018-09-11 0700)  . pureflow 3 each (2018-09-11 0425)   . aspirin EC  81 mg Oral Daily  . chlorhexidine  15 mL Mouth Rinse BID  . Chlorhexidine Gluconate Cloth  6 each Topical Daily  . docusate sodium  100 mg Oral Daily  . hydrocortisone sod succinate (SOLU-CORTEF) inj  25 mg Intravenous Q12H  . insulin aspart  0-15 Units Subcutaneous Q4H  . mouth rinse  15 mL Mouth Rinse q12n4p  . pantoprazole (PROTONIX) IV  40 mg Intravenous Q24H  .  sodium chloride flush  10-40 mL Intracatheter Q12H   acetaminophen **OR** acetaminophen, haloperidol  lactate, heparin, ipratropium-albuterol, morphine injection, nitroGLYCERIN, [DISCONTINUED] ondansetron **OR** ondansetron (ZOFRAN) IV, oxyCODONE-acetaminophen, senna-docusate  Assessment/ Plan:  [name] is a 63 y.o. black male with end stage renal disease on hemodialysis, hypertension, hepatitis C, anemia, seizure disorder, COPD/tobacco abuse,substance abuse (cocaine) and alcohol abuse,secondary hyperparathyroidism, chronic systolic congestive heart failure admitted on 08/08/2018 with chest pain and atrial flutter. Converted to sinus rhythm but then complicated by bradycardia. Transferred to ICU on 6/15 for worsening mental status, hypoglycemia, hypotension, sepsis and hyperkalemia.  Placed on CRRT on 6/16. Required up to three vasopressors. Weaning vasopressors. Metabolic encephalopathy has not improved in several days.   [facility] M[facility] [address]Left arm AVG. 240 minutes 72kg  1.  ESRD on HD MWF.  Placed on CRRT  Requiring vasopressors: currently on norepinephrine  Not hemodynamically stable for intermittent hemodialysis treatment.  - Continue CVVHD 4K bath - Minimal ultrafiltration  2.  Anemia of chronic kidney disease.  Hemoglobin 12.1 Holding EPO due to concerns of dissection/ischemia.   3.  Secondary hyperparathyroidism with hyperphophatemia. PTH elevated at 1178 on 07/31/2018. Unable to tolerate PO binders.   4.  Shock with hypotension: cardiogenic with Chest pain/atrial flutter/bradycardia.   Outpatient baseline blood pressures are 90s/60s.  - Weaing vasopressors.   5. Diabetes mellitus type II with chronic kidney disease: with prolonged hypoglycemia   LOS: 8 [name] 2020-08-1809:52 AM

## 2018-08-23 NOTE — Progress Notes (Signed)
   2018/09/08 2035  Clinical Encounter Type  Visited With Patient and family together  Visit Type Spiritual support  Referral From Nurse  Consult/Referral To Chaplain  Spiritual Encounters  Spiritual Needs Emotional;Grief support  CH received page at 2035 of CB in progress. Pt was receiving CPR upon arrival. Several medical staff were administering care. CH went to ED to wait for son Christen Bame to arrive. Two sons arrived and Pryor Curia led family to room. Patient was deceased upon arrival. Family is grieving appropriately. Waiting for sister to arrive to make final decisions. [place] provided pastoral care by being a non-anxious presence and anticipating needs of family.

## 2018-08-23 NOTE — Progress Notes (Signed)
Tried to get patient's temperature and pulse oxygen earlier in shift and was unable to obtain a axillary temp, thermometer was not registering. Unable to obtain oral due to bipap. Patient body temp cool. Applied warm blanket and obtained rectal temp and it was 33. Warming blanket set to high temperature to increase body temp and HR started to drop. Spoke with Dr. Bretta Bang and Precedex gtt was turned off at 2000 with related bradycardia.

## 2018-08-23 NOTE — ED Provider Notes (Signed)
[place]  Department of Emergency Medicine   Code Blue CONSULT NOTE  Chief Complaint: Cardiac arrest/unresponsive   Level V Caveat: Unresponsive  History of present illness: I was contacted by the hospital for a CODE BLUE cardiac arrest upstairs and presented to the patient's bedside.   ROS: Unable to obtain, Level V caveat  Scheduled Meds: . aspirin EC  81 mg Oral Daily  . chlorhexidine  15 mL Mouth Rinse BID  . Chlorhexidine Gluconate Cloth  6 each Topical Daily  . docusate sodium  100 mg Oral Daily  . hydrocortisone sod succinate (SOLU-CORTEF) inj  25 mg Intravenous Q12H  . insulin aspart  0-15 Units Subcutaneous Q4H  . mouth rinse  15 mL Mouth Rinse q12n4p  . pantoprazole (PROTONIX) IV  40 mg Intravenous Q24H  . sodium chloride flush  10-40 mL Intracatheter Q12H   Continuous Infusions: . dexmedetomidine (PRECEDEX) IV infusion Stopped (2018/08/30 2000)  . dextrose 40 mL/hr at 2018-08-30 1247  . DOPamine    . norepinephrine (LEVOPHED) 16mg  / 212mL infusion 10 mcg/min (08/30/2018 2032)  . pureflow 3 each (08-30-18 0425)  . valproate sodium 500 mg (08/30/2018 1733)   PRN Meds:.acetaminophen **OR** acetaminophen, haloperidol lactate, heparin, ipratropium-albuterol, LORazepam, morphine injection, nitroGLYCERIN, [DISCONTINUED] ondansetron **OR** ondansetron (ZOFRAN) IV, oxyCODONE-acetaminophen, senna-docusate Past Medical History:  Diagnosis Date  . Arthritis   . CAD (coronary artery disease)    a. 09/2013 [facility]): basal inf defect more pronounced @ rest, likely artifact, EF 48%, low risk study; b. 06/2016 [facility]): LAD 10p, 30-30m, 40-50d, D1 90(small), D2 40, D3 60-70, D4 30, LCX 10p, 61m, 20d, Om1 30, OM3 40, RCA 100p w/ bridging collats, m/d RCA fill via collats, small/mod caliber-->Med Rx; c. 02/2017 MV: inf infarct w/ peri-inf ischemia. EF <30%.   . Chronic combined systolic and diastolic CHF (congestive heart failure) ([place])    a. 10/2013 Echo [facility]): EF 50%,  diast dysfxn;  b. 10/2014 Echo: EF 30-35%, diff HK, gr1 DD;  c. 04/2016 Echo: EF 20-25%, sev dil LV, diff HK, gr3 DD; d. 06/2016 Echo: EF 30-35%, Gr2 DD.  . Diabetes ([place])   . Dyspnea   . ESRD (end stage renal disease) ([place])    a. MWF dialysis @ [facility].  . Hepatitis    Patient is unsure type of Hepatitis he has  . Hypertension   . Hypertensive heart disease with CHF (congestive heart failure) ([place])   . Mixed Ischemic and non-ischemic cardiomyopathy    a. 10/2013 Echo [facility]): EF 50%, diast dysfxn;  b. 10/2014 Echo: EF 30-35%, diff HK, gr1 DD;  c. 04/2016 Echo: EF 20-25%, sev dil LV, diff HK, gr3 DD; d. 06/2016 Echo: EF 30-35%, Gr2 DD.  [name] Moderate Aortic insufficiency    a. 04/2016 Echo: Mod AI.  [name] Myocardial infarction ([place])   . Peripheral vascular disease ([place])   . Pulmonary hypertension ([place])    a. 10/2014 Echo: Mod-Sev PAH.  [name] PVC's (premature ventricular contractions)    a. 02/2017 24 hr holter: freq polymorphic PVCs w/ total of 38K beats in 24 hrs (34% burden).  . Renal insufficiency    Past Surgical History:  Procedure Laterality Date  . A/V FISTULAGRAM Left 04/06/2016   Procedure: A/V Fistulagram;  Surgeon: Katha Cabal, MD;  Location: [place] [facility];  Service: Cardiovascular;  Laterality: Left;  . A/V FISTULAGRAM Left 01/19/2017   Procedure: A/V FISTULAGRAM;  Surgeon: Katha Cabal, MD;  Location: [place] [facility];  Service:  Cardiovascular;  Laterality: Left;  . A/V SHUNT INTERVENTION N/A 04/06/2016   Procedure: A/V Shunt Intervention;  Surgeon: Katha Cabal, MD;  Location: [place] [facility];  Service: Cardiovascular;  Laterality: N/A;  . A/V SHUNT INTERVENTION N/A 06/29/2016   Procedure: A/V Shunt Intervention;  Surgeon: Katha Cabal, MD;  Location: [place] [facility];  Service: Cardiovascular;  Laterality: N/A;  . AV FISTULA PLACEMENT Left 2014  . AV FISTULA PLACEMENT Left 06/01/2017   Procedure: INSERTION OF ARTERIOVENOUS (AV) GORE-TEX GRAFT  ARM ( BRACHIAL AXILLARY );  Surgeon: Katha Cabal, MD;  Location: [facility] ORS;  Service: Vascular;  Laterality: Left;  . DIALYSIS/PERMA CATHETER INSERTION Right    Dr. Delana Meyer  . DIALYSIS/PERMA CATHETER REMOVAL N/A 07/05/2017   Procedure: DIALYSIS/PERMA CATHETER REMOVAL;  Surgeon: Katha Cabal, MD;  Location: [place] [facility];  Service: Cardiovascular;  Laterality: N/A;  . EMBOLIZATION N/A 05/09/2018   Procedure: EMBOLIZATION (Duodenal Ulceration);  Surgeon: Katha Cabal, MD;  Location: [place] [facility];  Service: Cardiovascular;  Laterality: N/A;  . ESOPHAGOGASTRODUODENOSCOPY N/A 05/04/2018   Procedure: ESOPHAGOGASTRODUODENOSCOPY (EGD);  Surgeon: Virgel Manifold, MD;  Location: [facility] ENDOSCOPY;  Service: Gastroenterology;  Laterality: N/A;  . ESOPHAGOGASTRODUODENOSCOPY (EGD) WITH PROPOFOL N/A 04/20/2018   Procedure: ESOPHAGOGASTRODUODENOSCOPY (EGD) WITH PROPOFOL;  Surgeon: Jonathon Bellows, MD;  Location: [facility] ENDOSCOPY;  Service: Gastroenterology;  Laterality: N/A;  . PERIPHERAL VASCULAR CATHETERIZATION N/A 10/03/2014   Procedure: A/V Shuntogram/Fistulagram;  Surgeon: Algernon Huxley, MD;  Location: [place] [facility];  Service: Cardiovascular;  Laterality: N/A;  . PERIPHERAL VASCULAR CATHETERIZATION Left 10/03/2014   Procedure: A/V Shunt Intervention;  Surgeon: Algernon Huxley, MD;  Location: [place] [facility];  Service: Cardiovascular;  Laterality: Left;  . PERIPHERAL VASCULAR CATHETERIZATION Left 05/01/2015   Procedure: A/V Shuntogram/Fistulagram;  Surgeon: Algernon Huxley, MD;  Location: [place] [facility];  Service: Cardiovascular;  Laterality: Left;  . PERIPHERAL VASCULAR CATHETERIZATION N/A 05/01/2015   Procedure: A/V Shunt Intervention;  Surgeon: Algernon Huxley, MD;  Location: [place] [facility];  Service: Cardiovascular;  Laterality: N/A;  . UPPER EXTREMITY ANGIOGRAPHY Bilateral 06/29/2016   Procedure: Upper Extremity Angiography;  Surgeon: Katha Cabal, MD;  Location: [place] [facility];  Service: Cardiovascular;  Laterality: Bilateral;  . UPPER EXTREMITY INTERVENTION  06/29/2016   Procedure: Upper Extremity Intervention;  Surgeon: Katha Cabal, MD;  Location: [place] [facility];  Service: Cardiovascular;;  . UPPER EXTREMITY VENOGRAPHY Left 03/15/2017   Procedure: UPPER EXTREMITY VENOGRAPHY;  Surgeon: Katha Cabal, MD;  Location: [place] [facility];  Service: Cardiovascular;  Laterality: Left;   Social History   Socioeconomic History  . Marital status: Widowed    Spouse name: Not on file  . Number of children: Not on file  . Years of education: Not on file  . Highest education level: Not on file  Occupational History  . Not on file  Social Needs  . Financial resource strain: Not on file  . Food insecurity    Worry: Not on file    Inability: Not on file  . Transportation needs    Medical: Not on file    Non-medical: Not on file  Tobacco Use  . Smoking status: Former Smoker    Packs/day: 0.25    Years: 40.00    Pack years: 10.00    Types: Cigarettes    Quit date: 10/12/2016    Years since quitting: 1.8  . Smokeless tobacco: Never Used  Substance and Sexual Activity  . Alcohol use: No    Alcohol/week: 0.0 standard drinks  . Drug use: Yes    Types: Cocaine, Marijuana    Comment: patient states no drugs  . Sexual activity: Not Currently  Lifestyle  . Physical activity    Days per week: Not on file    Minutes per session: Not on file  . Stress: Not on file  Relationships  . Social Herbalist on phone: Not on file    Gets together: Not on file    Attends religious service: Not on file    Active member of club or organization: Not on file    Attends meetings of clubs or organizations: Not on file    Relationship status: Not on file  . Intimate partner violence    Fear of current or ex partner: Not on file    Emotionally abused: Not on file    Physically abused: Not on file    Forced sexual activity: Not on file   Other Topics Concern  . Not on file  Social History Narrative   Lives at home in [place] by himself.   Independent on ambulation but does not routinely exercise.   Allergies  Allergen Reactions  . Sulfa Antibiotics Shortness Of Breath, Itching and Rash    Last set of Vital Signs (not current) Vitals:   Aug 24, 2018 2031 August 24, 2018 2033  BP: (!) 53/41   Pulse:    Resp:  (!) 0  Temp:    SpO2:        Physical Exam  Gen: unresponsive Cardiovascular: pulseless  Resp: apneic. Breath sounds equal bilaterally with bagging  Abd: nondistended  Neuro: GCS 3, unresponsive to pain  HEENT: No blood in posterior pharynx, gag reflex absent  Neck: No crepitus  Musculoskeletal: No deformity  Skin: warm  Procedures  INTUBATION Performed by: Laurence Aly Required items: required blood products, implants, devices, and special equipment available Patient identity confirmed: provided demographic data and hospital-assigned identification number Time out: Immediately prior to procedure a "time out" was called to verify the correct patient, procedure, equipment, support staff and site/side marked as required. Indications: Cardiopulmonary arrest Intubation method: Glide scope Preoxygenation: 100 %BVM Sedatives: None Paralytic: None Tube Size: 7.5 cuffed Post-procedure assessment: chest rise and ETCO2 monitor Breath sounds: equal and absent over the epigastrium Tube secured by Respiratory Therapy Patient tolerated the procedure well with no immediate complications.  CRITICAL CARE Performed by: Laurence Aly Total critical care time: 15 Critical care time was exclusive of separately billable procedures and treating other patients. Critical care was necessary to treat or prevent imminent or life-threatening deterioration. Critical care was time spent personally by me on the following activities: development of treatment plan with patient and/or surrogate as well as nursing,  discussions with consultants, evaluation of patient's response to treatment, examination of patient, obtaining history from patient or surrogate, ordering and performing treatments and interventions, ordering and review of laboratory studies, ordering and review of radiographic studies, pulse oximetry and re-evaluation of patient's condition.  Cardiopulmonary Resuscitation (CPR) Procedure Note  Directed/Performed by: Laurence Aly I personally directed ancillary staff and/or performed CPR in an effort to regain return of spontaneous circulation and to maintain cardiac, neuro and systemic perfusion.    Medical Decision making  Cardiopulmonary arrest, aspiration  Assessment and Plan  Cardiopulmonary arrest  I arrived to the bedside to find the patient being bagged and chest compressions being delivered.  Pulmonary and critical  care medicine physician was at the bedside.  I intubated the patient using a glide scope and this went smoothly on the first attempt during compressions.  Patient care transferred to the critical care team.   Earleen Newport, MD 2018/08/20 2255

## 2018-08-23 NOTE — Progress Notes (Signed)
Patient's HR continued to drop and subsequentially became asystole around 2033. Code blue was called to initiate resuscitation. Resuscitation efforts occurred until 2056. All vitals has ceased and unable to regain ROSC. CRRT stopped at this time. Family, son Marcelino Scot was called at 2046 to come to the hospital because the patient heart had stopped. Family was able to come to see patient and updated about the patient's change in condition. CDS was called and patient was not a donor. Family provided funeral home information and will take the patient's belongings.

## 2018-08-23 DEATH — deceased

## 2018-09-23 NOTE — Death Summary Note (Signed)
DEATH SUMMARY  COMPLETED FOR DR Bretta Bang  Patient Details  Name: [name] MRN: [id number] DOB: 27-Jul-1955  Admission/Discharge Information   Admit Date:  22-Aug-2018  Date of Death: Date of Death: 08/30/18  Time of Death: Time of Death: 05/31/54  Length of Stay: 2022-05-17  Referring Physician: Donnie Coffin, MD   Reason(s) for Hospitalization  Critically ill multiorgan failure  Diagnoses  Preliminary cause of death: ischemic cardiomyopathy cocaine abuse and etoh abuse Secondary Diagnoses (including complications and co-morbidities):  Active Problems:   Hypotension   Atypical atrial flutter (HCC)      Pertinent Labs and Studies  Significant Diagnostic Studies Dg Chest 2 View  Result Date: Aug 22, 2018 CLINICAL DATA:  New cough and shortness of breath EXAM: CHEST - 2 VIEW COMPARISON:  May 10, 2018 FINDINGS: Vascular stent projects over the left subclavian region. Stable cardiomegaly. The hila, mediastinum, lungs, and pleura are normal. IMPRESSION: Persistent cardiomegaly.  No other abnormalities. Electronically Signed   By: Dorise Bullion III M.D   On: 08-22-2018 08:00   Dg Shoulder 1v Right  Result Date: 08/09/2018 CLINICAL DATA:  Right shoulder pain. EXAM: RIGHT SHOULDER - 1 VIEW COMPARISON:  None. FINDINGS: Single AP view of the shoulder. No fracture. Alignment is maintained. Mild acromioclavicular spurring. No bony destructive change or periosteal reaction. Soft tissues are unremarkable. IMPRESSION: Single portable AP view the shoulder demonstrating mild acromioclavicular degenerative change. No other explanation for pain. Electronically Signed   By: Keith Rake M.D.   On: 08/09/2018 03:52   Dg Abd 1 View  Result Date: 08/11/2018 CLINICAL DATA:  Abdominal distention. EXAM: ABDOMEN - 1 VIEW COMPARISON:  08/09/2018. FINDINGS: Surgical coils right upper quadrant. Oral contrast in the colon. No bowel distention. Aortoiliac atherosclerotic and peripheral vascular calcification.  Pelvic calcifications consistent phleboliths. Degenerative changes thoracolumbar spine and both hips. No acute bony abnormality identified. Dual-lumen right IJ catheter noted with tip at cavoatrial junction. Left subclavian vascular stent noted. Cardiomegaly. No pulmonary venous congestion. IMPRESSION: 1. Surgical coils right upper quadrant again noted. No acute intra-abdominal abnormality identified. No bowel distention or free air. 2. Dual-lumen right IJ catheter with tip at cavoatrial junction. Stable cardiomegaly. 3.  Aortoiliac and peripheral vascular disease. Electronically Signed   By: Marcello Moores  Register   On: 08/11/2018 06:00   Dg Abd 1 View  Result Date: 08/09/2018 CLINICAL DATA:  Abdominal discomfort and worsening lactic acidosis. EXAM: ABDOMEN - 1 VIEW COMPARISON:  Abdominal CT 2 days ago. FINDINGS: No bowel dilatation to suggest obstruction. Small bowel loops in the left abdomen with possible wall thickening. High-density material in the colon is seen on prior. No free air. Vascular coils in the central abdomen. There are vascular calcifications. IMPRESSION: Suspect wall thickening of small bowel loops in the left lower abdomen, possible enteritis. No obstruction or free air. Electronically Signed   By: Keith Rake M.D.   On: 08/09/2018 03:51   Ct Head Wo Contrast  Result Date: 08/07/2018 CLINICAL DATA:  Altered mental status EXAM: CT HEAD WITHOUT CONTRAST TECHNIQUE: Contiguous axial images were obtained from the base of the skull through the vertex without intravenous contrast. COMPARISON:  05/04/2018 FINDINGS: Brain: No evidence of acute infarction, hemorrhage, hydrocephalus, extra-axial collection or mass lesion/mass effect. Vascular: No hyperdense vessel or unexpected calcification. Skull: Normal. Negative for fracture or focal lesion. Sinuses/Orbits: Small, partially imaged air-fluid level of the left maxillary sinus. Other: Contrast opacification of the vascular structures and meninges,  likely due to persistent circulating contrast in the setting of  renal failure and hemodialysis. IMPRESSION: 1.  No acute intracranial pathology. 2. Small, partially imaged air-fluid level of the left maxillary sinus. Correlate for evidence of sinusitis. 3. Contrast opacification of the vascular structures and meninges, likely due to persistent circulating contrast in the setting of renal failure and hemodialysis. Electronically Signed   By: Eddie Candle M.D.   On: 08/07/2018 16:31   Ct Chest W Contrast  Result Date: 08/07/2018 CLINICAL DATA:  Chest pain and shortness of breath, abdominal pain and distension today. EXAM: CT CHEST, ABDOMEN, AND PELVIS WITH CONTRAST TECHNIQUE: Multidetector CT imaging of the chest, abdomen and pelvis was performed following the standard protocol during bolus administration of intravenous contrast. CONTRAST:  137mL OMNIPAQUE IOHEXOL 300 MG/ML  SOLN COMPARISON:  Chest CT 08/14/2018 and abdominal CT scan 05/04/2018 FINDINGS: CT CHEST FINDINGS Cardiovascular: The heart is enlarged but stable. No pericardial effusion. Stable tortuosity, ectasia and heavy calcification of the thoracic aorta. Stable three-vessel coronary artery calcifications. Stable left brachiocephalic vein stent. Enlarged pulmonary arteries consistent with pulmonary hypertension. Mediastinum/Nodes: Stable scattered mediastinal and hilar lymph nodes but no mass or overt adenopathy. The esophagus is grossly normal. Lungs/Pleura: Small to moderate-sized right effusion and small diffusion both larger when compared to prior recent chest CT. There is also overlying atelectasis. No overt pulmonary edema, focal pulmonary infiltrates or worrisome pulmonary lesions. Musculoskeletal: No chest wall masses identified. There is diffuse body wall edema noted suggesting anasarca. No supraclavicular or axillary lymphadenopathy the. The bony structures are unremarkable and appears stable. CT ABDOMEN PELVIS FINDINGS Hepatobiliary:  Moderate reflux of contrast down the IVC and into the hepatic veins suggesting right heart failure or tricuspid regurgitation. Liver demonstrates changes suggesting hepatic congestion. No worrisome hepatic lesions or intrahepatic biliary dilatation. Contrast noted in the gallbladder related to the recent chest CT with contrast. Pancreas: No mass, inflammation or ductal dilatation. Spleen: Normal size.  No focal lesions. Adrenals/Urinary Tract: The adrenal glands are unremarkable and stable. Small kidneys with extensive arterial calcifications. No worrisome renal lesions. The bladder is grossly normal. Stomach/Bowel: The stomach, duodenum, small bowel and colon are grossly normal. No obvious acute inflammatory process, mass lesion or obstructive findings. Vascular/Lymphatic: Heavy atherosclerotic calcifications involving the aorta and branch vessels, particularly the renal arteries. Stable scattered mesenteric and retroperitoneal lymph nodes but no mass or overt adenopathy. Reproductive: The prostate gland and seminal vesicles are unremarkable. Other: Diffuse mesenteric edema and small volume abdominal/pelvic ascites. The pelvic fluid demonstrates higher attenuation which could suggest hemorrhage or peritonitis. No obvious active extravasation of contrast material. Coils are noted in the region of the gastroduodenal artery from previous embolization procedure. Musculoskeletal: Severe/aggressive degenerative changes involving the lumbar spine. Amyloid spondyloarthropathy would certainly be a consideration given the patient's history of renal dialysis. IMPRESSION: 1. Enlarging bilateral pleural effusions since the prior CT scan from 2 days ago. Moderate on the right and small the left with overlying atelectasis. 2. Stable cardiac enlargement and vascular congestion but no overt pulmonary edema. 3. Reflux of contrast down the IVC consistent with right heart failure or tricuspid regurgitation. Changes of hepatic  congestion are noted. 4. Diffuse body wall edema, mesenteric edema and abdominal/pelvic ascites. The ascites in the abdomen/pelvis is high in attenuation suggesting complex fluid or hemorrhage. No obvious extravasating contrast is identified. 5. Extensive vascular calcifications. 6. Severe and aggressive degenerative changes involving the lumbar spine. Amyloid spondyloarthropathy would be a consideration. Electronically Signed   By: Marijo Sanes M.D.   On: 08/07/2018 19:37   Ct Angio Chest  Pe W And/or Wo Contrast  Result Date: 08/17/2018 CLINICAL DATA:  Chest pain. EXAM: CT ANGIOGRAPHY CHEST WITH CONTRAST TECHNIQUE: Multidetector CT imaging of the chest was performed using the standard protocol during bolus administration of intravenous contrast. Multiplanar CT image reconstructions and MIPs were obtained to evaluate the vascular anatomy. CONTRAST:  56mL OMNIPAQUE IOHEXOL 350 MG/ML SOLN COMPARISON:  Chest x-ray August 05, 2018.  Chest CT Jul 04, 2016. FINDINGS: Cardiovascular: Cardiomegaly is noted. A stent is seen in the left brachiocephalic vein. Patency cannot be evaluated due to lack of contrast in these vessels due to a right-sided injection. Atherosclerotic changes are seen in the aorta. The ascending thoracic aorta measures up to 4.4 cm, unchanged since May of 2018. No dissection identified. The main pulmonary artery measures 3.7 cm. No pulmonary emboli. Mediastinum/Nodes: The thyroid and esophagus are normal. No adenopathy. Tiny pleural effusions are noted. No pericardial effusion. Lungs/Pleura: Left retrocardiac atelectasis is noted. No other suspicious infiltrates. Central airways are normal. No pneumothorax. Upper Abdomen: No acute abnormality. Musculoskeletal: No chest wall abnormality. No acute or significant osseous findings. Review of the MIP images confirms the above findings. IMPRESSION: 1. No pulmonary emboli identified. 2. Cardiomegaly.  Tiny pleural effusions. 3. There is a stent in the left  brachiocephalic vein. Patency cannot be evaluated due to a right-sided injection. 4. Atherosclerotic changes in the aorta. 5. 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 Recommend annual imaging followup by CTA or MRA. This recommendation follows 2010 ACCF/AHA/AATS/ACR/ASA/SCA/SCAI/SIR/STS/SVM Guidelines for the Diagnosis and Management of Patients with Thoracic Aortic Disease. Circulation. 2010; 121: O962-X528. Aortic aneurysm NOS (ICD10-I71.9) 6. The main pulmonary artery measures 3.7 cm which raises the possibility of pulmonary arterial hypertension. 7. Mild left basilar atelectasis in the retrocardiac region. Aortic Atherosclerosis (ICD10-I70.0). Electronically Signed   By: Dorise Bullion III M.D   On: 08/03/2018 10:29   Ct Abdomen Pelvis W Contrast  Result Date: 08/07/2018 CLINICAL DATA:  Chest pain and shortness of breath, abdominal pain and distension today. EXAM: CT CHEST, ABDOMEN, AND PELVIS WITH CONTRAST TECHNIQUE: Multidetector CT imaging of the chest, abdomen and pelvis was performed following the standard protocol during bolus administration of intravenous contrast. CONTRAST:  19mL OMNIPAQUE IOHEXOL 300 MG/ML  SOLN COMPARISON:  Chest CT 08/08/2018 and abdominal CT scan 05/04/2018 FINDINGS: CT CHEST FINDINGS Cardiovascular: The heart is enlarged but stable. No pericardial effusion. Stable tortuosity, ectasia and heavy calcification of the thoracic aorta. Stable three-vessel coronary artery calcifications. Stable left brachiocephalic vein stent. Enlarged pulmonary arteries consistent with pulmonary hypertension. Mediastinum/Nodes: Stable scattered mediastinal and hilar lymph nodes but no mass or overt adenopathy. The esophagus is grossly normal. Lungs/Pleura: Small to moderate-sized right effusion and small diffusion both larger when compared to prior recent chest CT. There is also overlying atelectasis. No overt pulmonary edema, focal pulmonary infiltrates  or worrisome pulmonary lesions. Musculoskeletal: No chest wall masses identified. There is diffuse body wall edema noted suggesting anasarca. No supraclavicular or axillary lymphadenopathy the. The bony structures are unremarkable and appears stable. CT ABDOMEN PELVIS FINDINGS Hepatobiliary: Moderate reflux of contrast down the IVC and into the hepatic veins suggesting right heart failure or tricuspid regurgitation. Liver demonstrates changes suggesting hepatic congestion. No worrisome hepatic lesions or intrahepatic biliary dilatation. Contrast noted in the gallbladder related to the recent chest CT with contrast. Pancreas: No mass, inflammation or ductal dilatation. Spleen: Normal size.  No focal lesions. Adrenals/Urinary Tract: The adrenal glands are unremarkable and stable. Small kidneys with extensive  arterial calcifications. No worrisome renal lesions. The bladder is grossly normal. Stomach/Bowel: The stomach, duodenum, small bowel and colon are grossly normal. No obvious acute inflammatory process, mass lesion or obstructive findings. Vascular/Lymphatic: Heavy atherosclerotic calcifications involving the aorta and branch vessels, particularly the renal arteries. Stable scattered mesenteric and retroperitoneal lymph nodes but no mass or overt adenopathy. Reproductive: The prostate gland and seminal vesicles are unremarkable. Other: Diffuse mesenteric edema and small volume abdominal/pelvic ascites. The pelvic fluid demonstrates higher attenuation which could suggest hemorrhage or peritonitis. No obvious active extravasation of contrast material. Coils are noted in the region of the gastroduodenal artery from previous embolization procedure. Musculoskeletal: Severe/aggressive degenerative changes involving the lumbar spine. Amyloid spondyloarthropathy would certainly be a consideration given the patient's history of renal dialysis. IMPRESSION: 1. Enlarging bilateral pleural effusions since the prior CT scan  from 2 days ago. Moderate on the right and small the left with overlying atelectasis. 2. Stable cardiac enlargement and vascular congestion but no overt pulmonary edema. 3. Reflux of contrast down the IVC consistent with right heart failure or tricuspid regurgitation. Changes of hepatic congestion are noted. 4. Diffuse body wall edema, mesenteric edema and abdominal/pelvic ascites. The ascites in the abdomen/pelvis is high in attenuation suggesting complex fluid or hemorrhage. No obvious extravasating contrast is identified. 5. Extensive vascular calcifications. 6. Severe and aggressive degenerative changes involving the lumbar spine. Amyloid spondyloarthropathy would be a consideration. Electronically Signed   By: Marijo Sanes M.D.   On: 08/07/2018 19:37   Dg Chest Port 1 View  Result Date: 08/09/2018 CLINICAL DATA:  Acute respiratory failure. EXAM: PORTABLE CHEST 1 VIEW COMPARISON:  Radiograph yesterday. FINDINGS: Tip of the right central line in the lower SVC. Vascular stent in the region of brachiocephalic unchanged cardiomegaly. Small bilateral pleural effusions, similar to prior. Improved right lung base atelectasis. No acute airspace disease. No pneumothorax. IMPRESSION: 1. Unchanged small bilateral pleural effusions. Improved right basilar atelectasis. 2. Stable cardiomegaly. Electronically Signed   By: Keith Rake M.D.   On: 08/09/2018 03:49   Dg Chest Port 1 View  Result Date: 08/08/2018 CLINICAL DATA:  Central line placement EXAM: PORTABLE CHEST 1 VIEW COMPARISON:  08/03/2018 FINDINGS: The patient is status post placement of a right-sided central venous catheter. The tip is well position near the cavoatrial junction. The heart size remains enlarged. Aortic calcifications are noted. A vascular stent projects over the left brachiocephalic vein. There are small bilateral pleural effusions, left worse than right. There is an airspace opacity at the right lung base favored to represent  atelectasis, however an infiltrate or aspiration is not excluded. There is no pneumothorax. IMPRESSION: 1. Well-positioned right-sided central venous catheter. No pneumothorax. 2. Cardiomegaly with persistent small bilateral pleural effusions and generalized volume overload. Electronically Signed   By: Constance Holster M.D.   On: 08/08/2018 16:41   Ct Angio Chest/abd/pel For Dissection W And/or Wo Contrast  Result Date: 07/29/2018 CLINICAL DATA:  Chest pain for 4 days.  Cough. EXAM: CT ANGIOGRAPHY CHEST, ABDOMEN AND PELVIS TECHNIQUE: Multidetector CT imaging through the chest, abdomen and pelvis was performed using the standard protocol during bolus administration of intravenous contrast. Multiplanar reconstructed images and MIPs were obtained and reviewed to evaluate the vascular anatomy. CONTRAST:  144mL ISOVUE-370 IOPAMIDOL (ISOVUE-370) INJECTION 76% COMPARISON:  CT of the chest August 05, 2018. CT of the abdomen and pelvis May 04, 2018 FINDINGS: CTA CHEST FINDINGS Cardiovascular: Cardiomegaly. Coronary artery calcifications identified. The pulmonary arteries were better assessed on the recent CT a  of the chest for PE. No pulmonary emboli in the central pulmonary arteries. The patient had a normal PE study earlier today as well. Atherosclerotic changes are seen in the thoracic aorta without dissection. The ascending thoracic aorta measures 4.2 cm on today's study, mildly aneurysmal. Mediastinum/Nodes: There is a stent in the left brachiocephalic vein which is well opacified on this study. The thyroid and esophagus are normal. A borderline node anterior to the trachea measuring 14 mm on axial image 40 is stable since 2018, likely reactive. No other evidence of adenopathy identified. Small pleural effusions. No pericardial effusion. Mild increased attenuation in the subcutaneous fat suggests volume overload. Lungs/Pleura: Tiny pleural effusions. Central airways are normal. Mild atelectasis in the posterior  left upper lobe. Mild atelectasis in the retrocardiac region, stable. No pneumothorax. Central airways are unchanged no suspicious pulmonary nodules, masses, or focal infiltrates. Musculoskeletal: There is a healed anterior left rib fracture. No acute bony abnormalities. Review of the MIP images confirms the above findings. CTA ABDOMEN AND PELVIS FINDINGS VASCULAR Aorta: Atherosclerotic change is seen in the nonaneurysmal abdominal aorta, extending into the iliac and femoral vessels. No dissection. Celiac: Patent without evidence of aneurysm, dissection, vasculitis or significant stenosis. SMA: Atherosclerotic changes near the origin of the SMA without significant stenosis. No other abnormalities. Renals: Atherosclerosis at the origin of the bilateral renal arteries is identified. IMA: Patent without evidence of aneurysm, dissection, vasculitis or significant stenosis. Inflow: Patent without evidence of aneurysm, dissection, vasculitis or significant stenosis. Veins: No obvious venous abnormality within the limitations of this arterial phase study. Review of the MIP images confirms the above findings. NON-VASCULAR Hepatobiliary: No focal liver abnormality is seen. No gallstones, gallbladder wall thickening, or biliary dilatation. Pancreas: Unremarkable. No pancreatic ductal dilatation or surrounding inflammatory changes. Spleen: Normal in size without focal abnormality. Adrenals/Urinary Tract: Adrenal glands are normal. The kidneys are atrophic consistent with renal disease. No hydronephrosis or acute perinephric stranding. The ureters are normal. The bladder is poorly distended but unremarkable. Stomach/Bowel: The stomach and small bowel are unremarkable. The colon is normal. The appendix is unremarkable. Lymphatic: No significant vascular findings are present. No enlarged abdominal or pelvic lymph nodes. Reproductive: Prostate is unremarkable. Other: There is increased attenuation in the subcutaneous fat and in  the intra-abdominal fat consistent volume overload. There is a small amount of fluid in the right pericolic gutter. Some of this fluid is adjacent to the appendix but there is no convincing evidence of appendicitis. Musculoskeletal: No acute or significant osseous findings. Review of the MIP images confirms the above findings. IMPRESSION: 1. 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 This is stable since 2018. No dissection. Atherosclerotic changes noted. Recommend annual imaging followup by CTA or MRA. This recommendation follows 2010 ACCF/AHA/AATS/ACR/ASA/SCA/SCAI/SIR/STS/SVM Guidelines for the Diagnosis and Management of Patients with Thoracic Aortic Disease. Circulation. 2010; 121: C585-I778. Aortic aneurysm NOS (ICD10-I71.9) 2. Cardiomegaly.  Coronary artery calcifications. 3. Increased attenuation in the subcutaneous fat diffusely as well as in the intra-abdominal fat with a small amount of fluid in the right pericolic gutter. These findings are most consistent with volume overload. 4. Tiny bilateral pleural effusions. Electronically Signed   By: Dorise Bullion III M.D   On: 08/21/2018 11:36    Microbiology No results found for this or any previous visit (from the past 240 hour(s)).  Lab Basic Metabolic Panel: No results for input(s): NA, K, CL, CO2, GLUCOSE, BUN, CREATININE, CALCIUM, MG, PHOS in the last 168 hours.  Liver Function Tests: No results for input(s): AST, ALT, ALKPHOS, BILITOT, PROT, ALBUMIN in the last 168 hours. No results for input(s): LIPASE, AMYLASE in the last 168 hours. No results for input(s): AMMONIA in the last 168 hours. CBC: No results for input(s): WBC, NEUTROABS, HGB, HCT, MCV, PLT in the last 168 hours. Cardiac Enzymes: No results for input(s): CKTOTAL, CKMB, CKMBINDEX, TROPONINI in the last 168 hours. Sepsis Labs: No results for input(s): PROCALCITON, WBC, LATICACIDVEN in the last 168 hours.   Flora Lipps 08/28/2018, 8:37 AM

## 2018-11-11 IMAGING — CR DG CHEST 2V
2 series · 2 of 2 positions shown · non-contrast
Comparison: 04/29/2016 chest radiograph.

CLINICAL DATA: Acute dyspnea, edema

EXAM:
CHEST  2 VIEW

[chest pa]
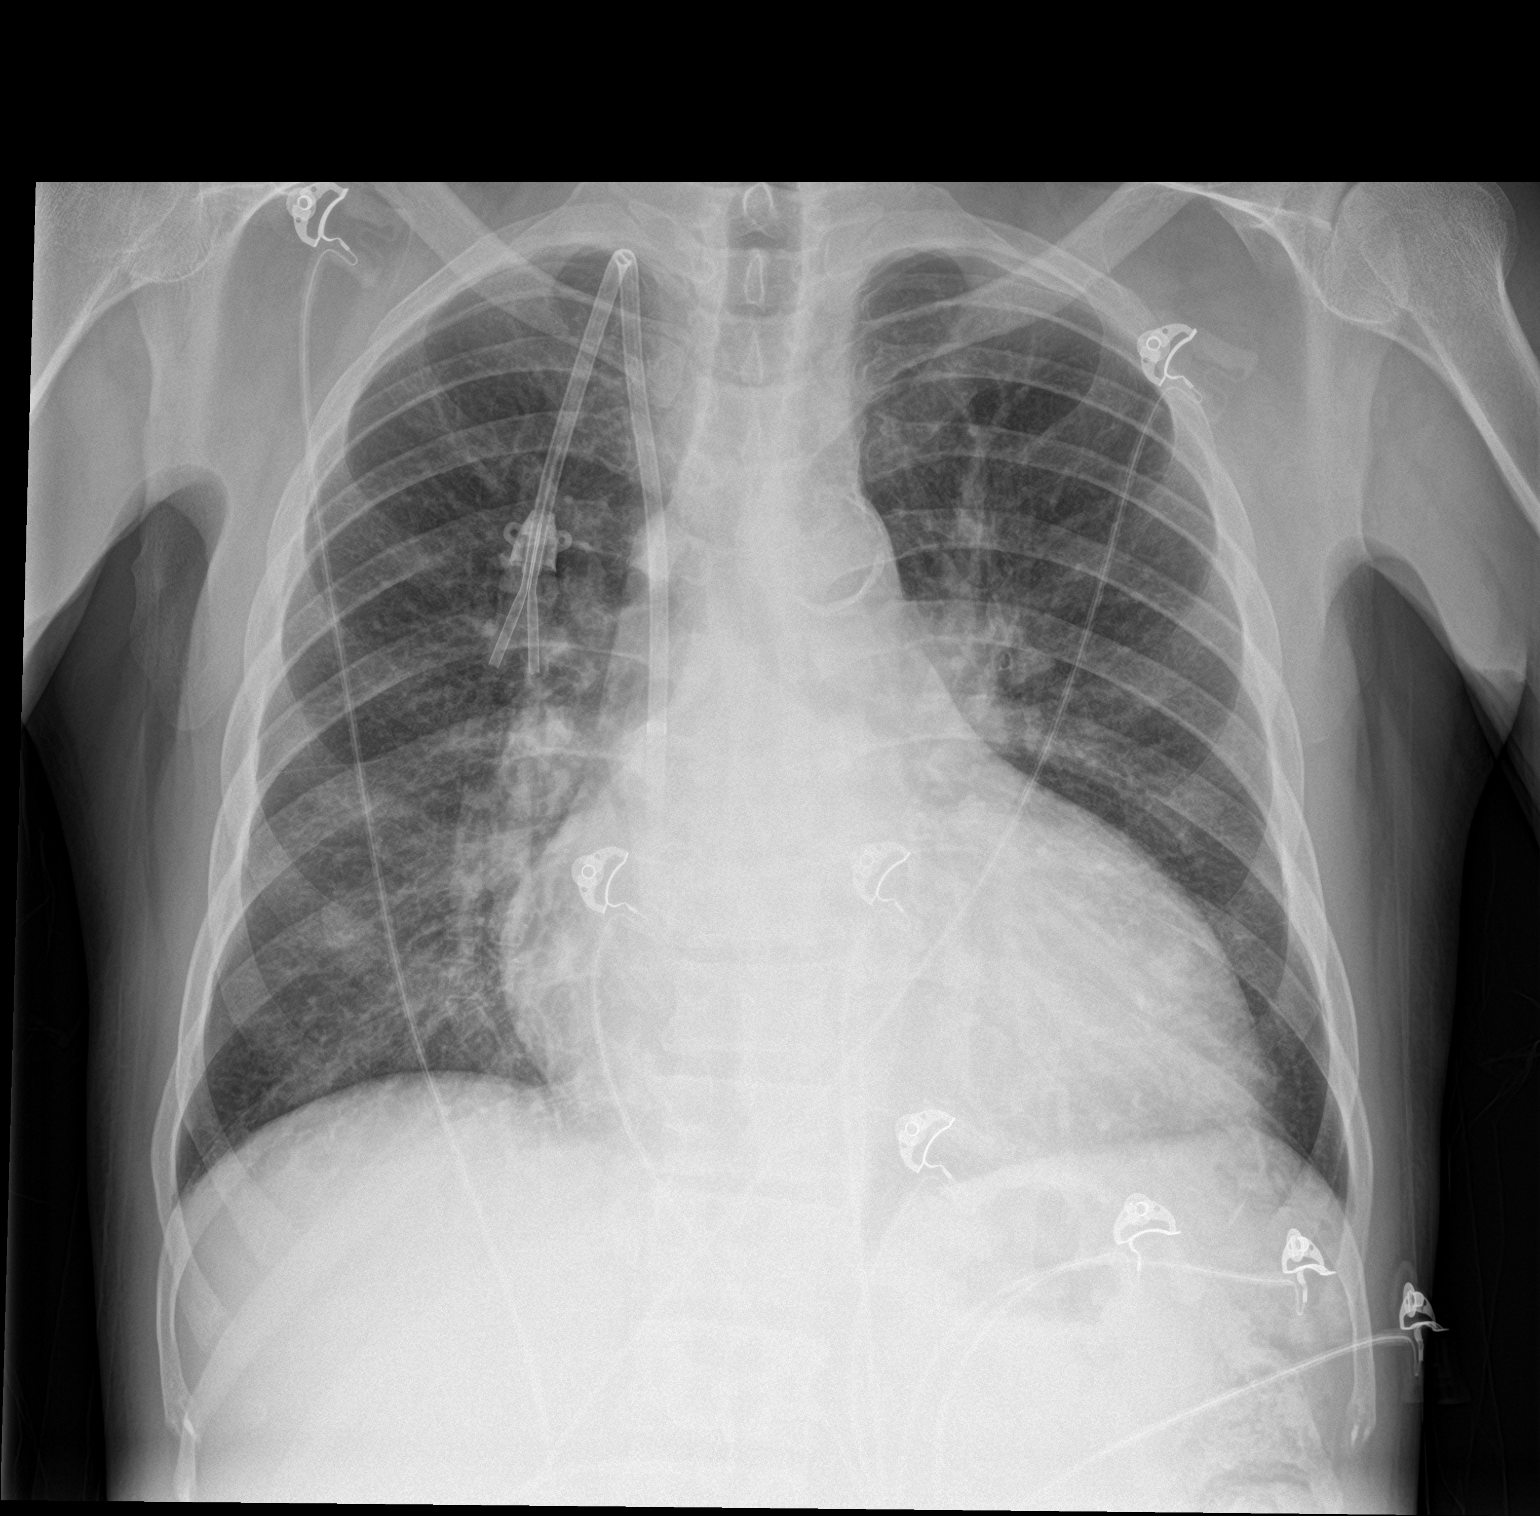

[chest lat]
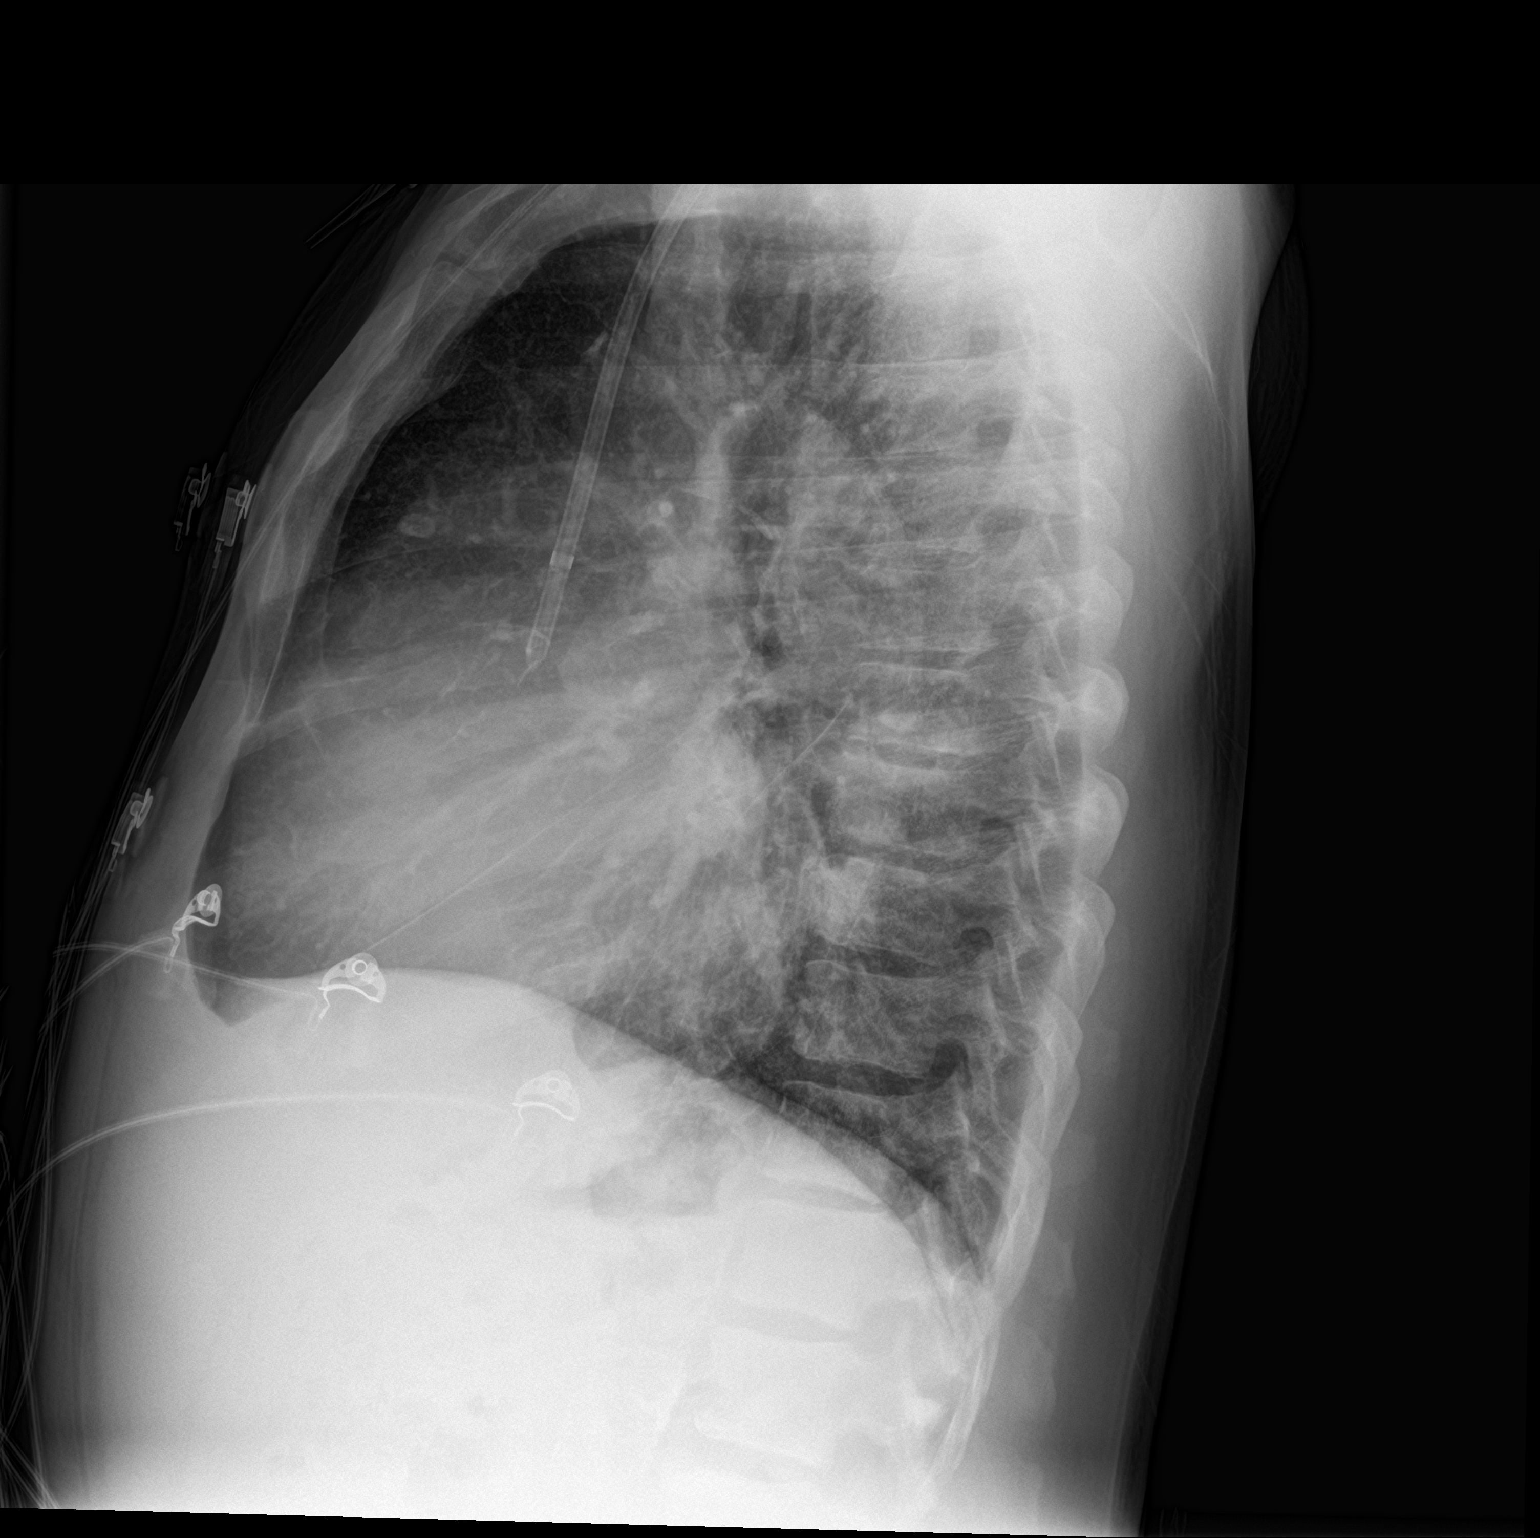

[2 of 2 positions shown; findings below may reference images not displayed]

FINDINGS: Right internal jugular central venous catheter terminates at the
cavoatrial junction. Stable cardiomediastinal silhouette with
cardiomegaly and aortic atherosclerosis. No pneumothorax. No pleural
effusion. Mild pulmonary edema. No acute consolidative airspace
disease.
IMPRESSION: Mild congestive heart failure.

Aortic atherosclerosis.

## 2018-12-09 IMAGING — CR DG CHEST 2V
2 series · 3 of 3 positions shown · non-contrast
Comparison: June 06, 2016

CLINICAL DATA: Shortness of breath.

EXAM:
CHEST  2 VIEW

[chest lat]
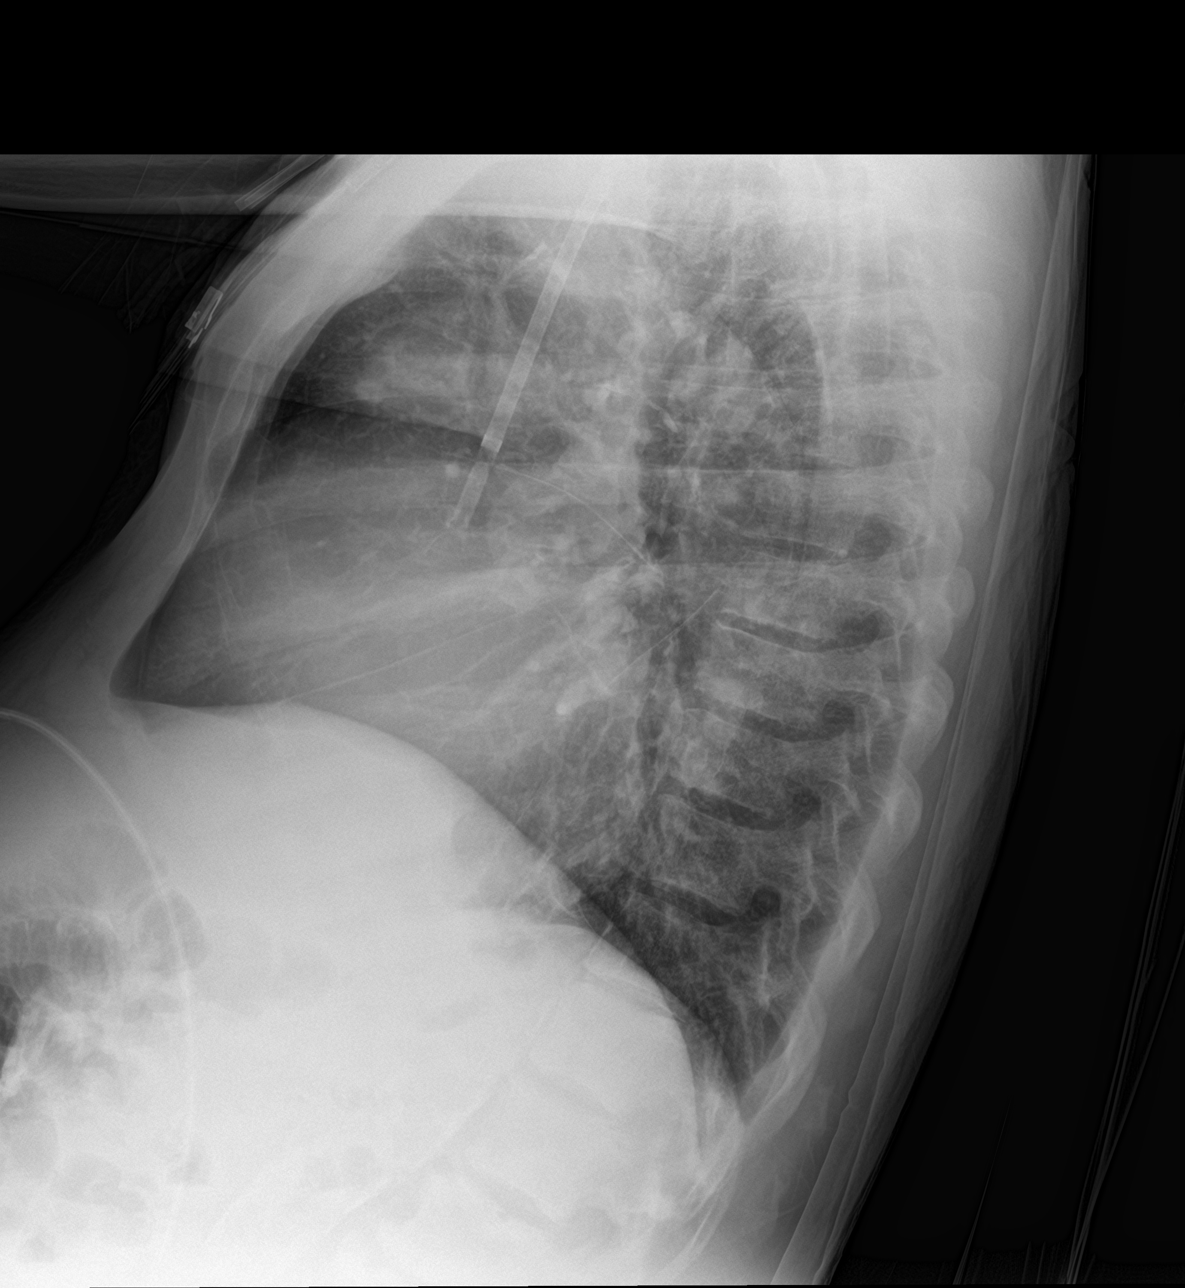

[Series 3: chest ap · 0.14mm/px · 2 of 2 slices shown]
[im 1/2]
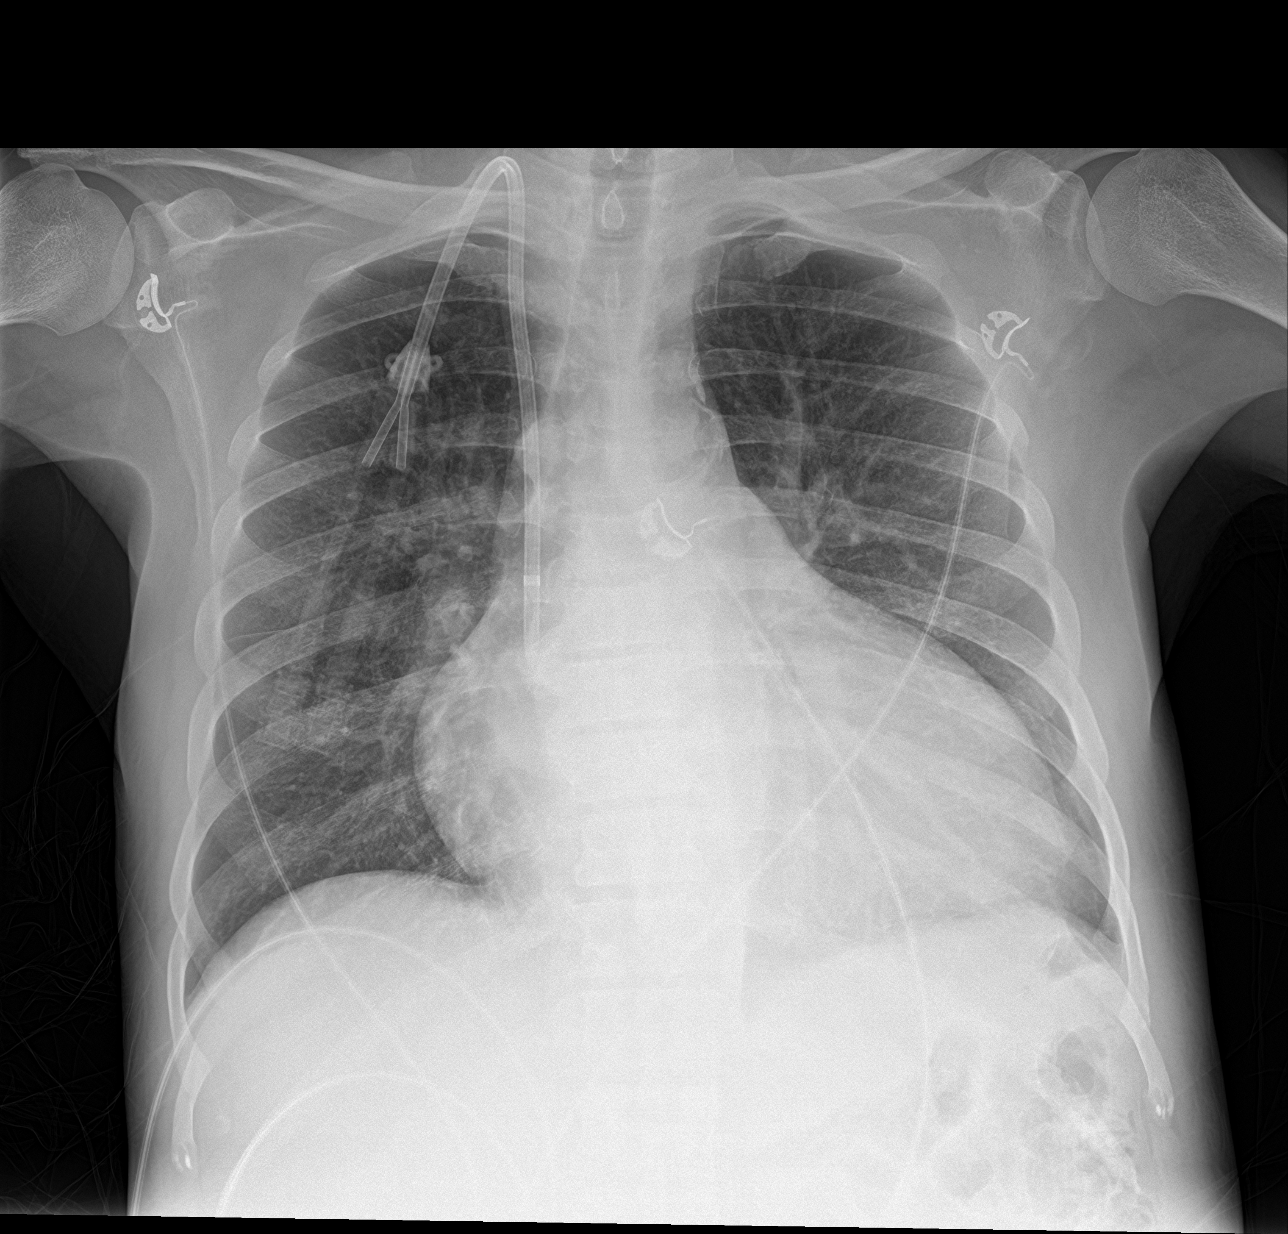
[im 2/2]
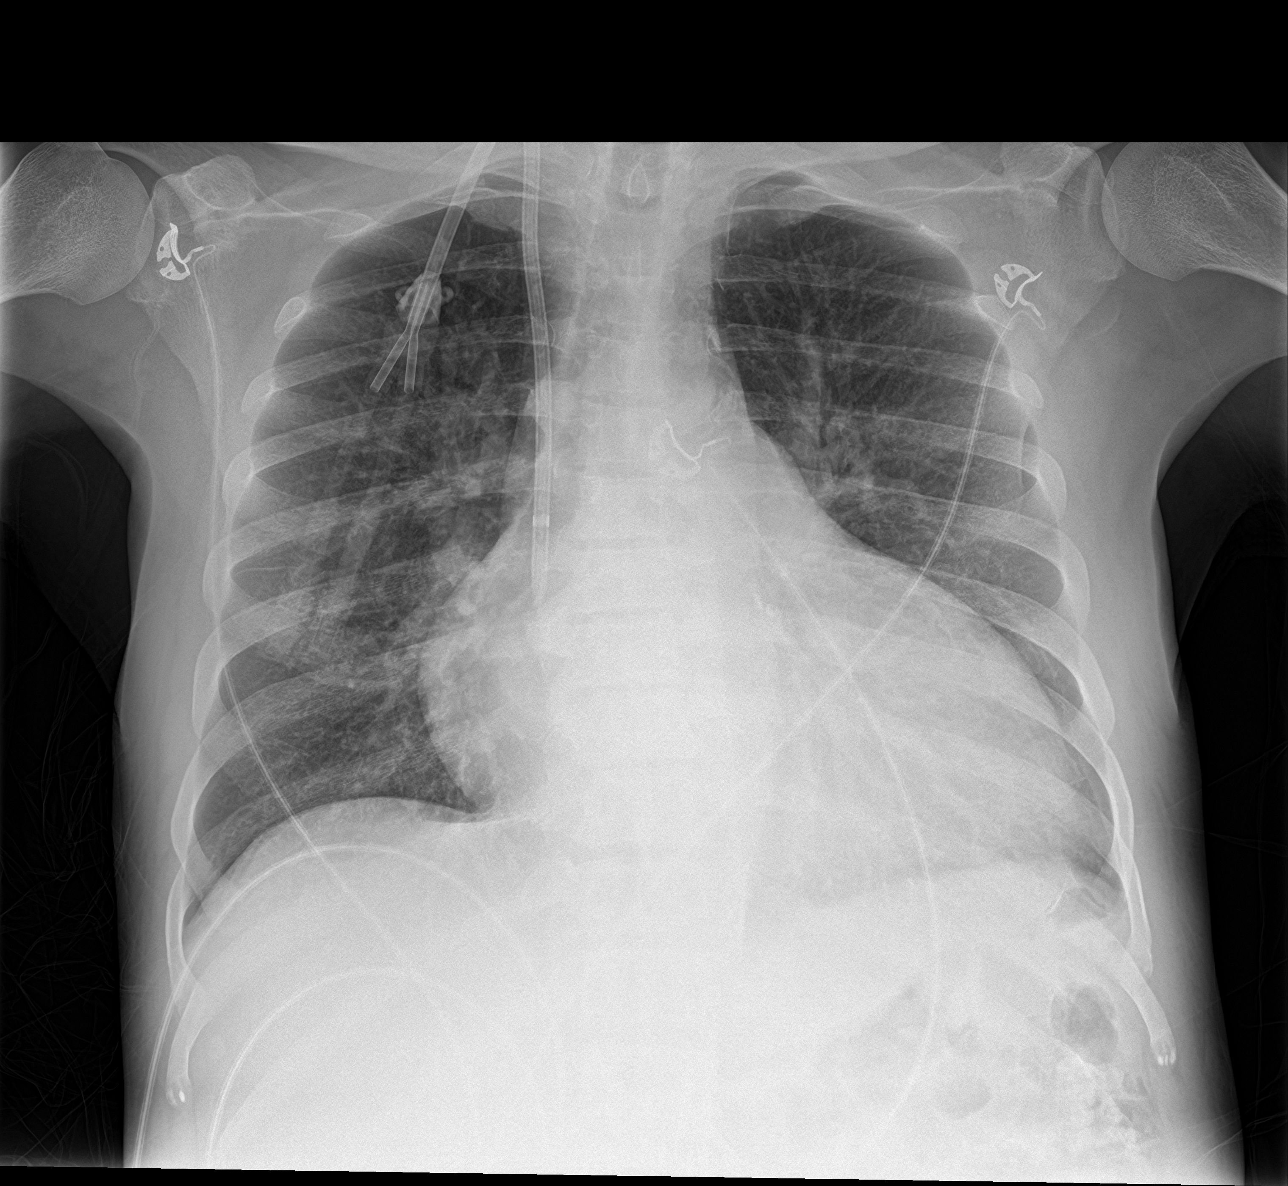

[3 of 3 positions shown; findings below may reference images not displayed]

FINDINGS: Stable dialysis catheter. Stable cardiomegaly. The hila and
mediastinum are normal. No focal infiltrates identified. No overt
edema.
IMPRESSION: No active cardiopulmonary disease.

## 2019-07-23 IMAGING — DX DG CHEST 1V PORT
1 series · 1 of 1 positions shown · non-contrast
Comparison: Radiographs November 20, 2016.

CLINICAL DATA: Shortness of breath, chest pain.

EXAM:
PORTABLE CHEST 1 VIEW

[chest ap]
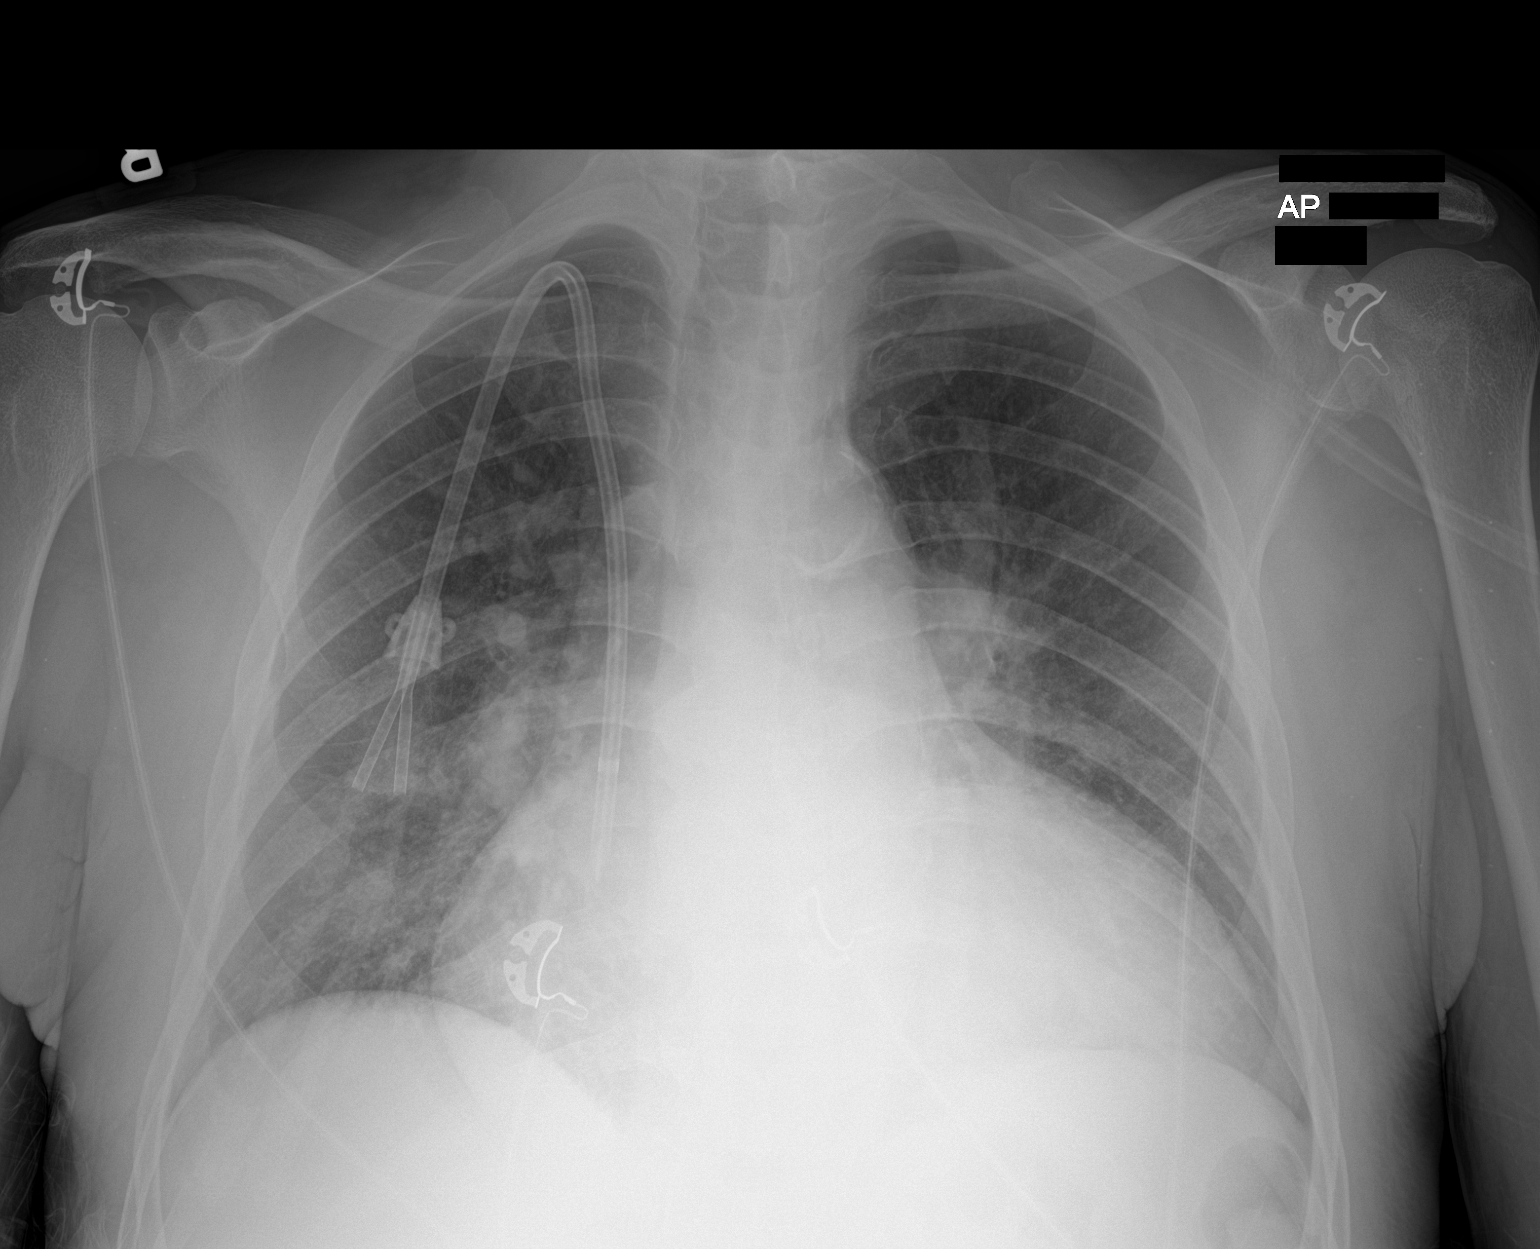

[1 of 1 positions shown; findings below may reference images not displayed]

FINDINGS: Stable cardiomegaly and central pulmonary vascular congestion is
noted. Aortic atherosclerosis is noted. Stable position of right
internal jugular catheter. No pneumothorax or pleural effusion is
noted. Both lungs are clear. The visualized skeletal structures are
unremarkable.
IMPRESSION: Stable cardiomegaly and central pulmonary vascular congestion.
Aortic atherosclerosis.
